# Patient Record
Sex: Male | Born: 1975 | Race: Black or African American | Hispanic: No | Marital: Single | Smoking: Former smoker
Health system: Southern US, Community
[De-identification: ages and names within clinical notes are randomized; demographics above are authoritative.]

## PROBLEM LIST (undated history)

## (undated) DIAGNOSIS — I2699 Other pulmonary embolism without acute cor pulmonale: Secondary | ICD-10-CM

## (undated) DIAGNOSIS — I10 Essential (primary) hypertension: Secondary | ICD-10-CM

## (undated) DIAGNOSIS — M1990 Unspecified osteoarthritis, unspecified site: Secondary | ICD-10-CM

## (undated) DIAGNOSIS — K2970 Gastritis, unspecified, without bleeding: Secondary | ICD-10-CM

## (undated) DIAGNOSIS — F329 Major depressive disorder, single episode, unspecified: Secondary | ICD-10-CM

## (undated) DIAGNOSIS — M109 Gout, unspecified: Secondary | ICD-10-CM

## (undated) DIAGNOSIS — F419 Anxiety disorder, unspecified: Secondary | ICD-10-CM

## (undated) DIAGNOSIS — F32A Depression, unspecified: Secondary | ICD-10-CM

## (undated) DIAGNOSIS — D500 Iron deficiency anemia secondary to blood loss (chronic): Secondary | ICD-10-CM

## (undated) HISTORY — PX: NO PAST SURGERIES: SHX2092

---
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
## 2001-11-22 ENCOUNTER — Emergency Department (HOSPITAL_COMMUNITY): Admission: EM | Admit: 2001-11-22 | Discharge: 2001-11-23 | Payer: Self-pay | Admitting: Emergency Medicine

## 2001-11-23 ENCOUNTER — Encounter: Payer: Self-pay | Admitting: Emergency Medicine

## 2001-12-08 ENCOUNTER — Encounter: Admission: RE | Admit: 2001-12-08 | Discharge: 2001-12-15 | Payer: Self-pay | Admitting: Orthopedic Surgery

## 2002-09-23 ENCOUNTER — Emergency Department (HOSPITAL_COMMUNITY): Admission: EM | Admit: 2002-09-23 | Discharge: 2002-09-23 | Payer: Self-pay | Admitting: *Deleted

## 2002-09-23 ENCOUNTER — Encounter: Payer: Self-pay | Admitting: *Deleted

## 2002-09-27 ENCOUNTER — Ambulatory Visit (HOSPITAL_COMMUNITY): Admission: RE | Admit: 2002-09-27 | Discharge: 2002-09-27 | Payer: Self-pay | Admitting: Orthopedic Surgery

## 2002-09-27 ENCOUNTER — Encounter: Payer: Self-pay | Admitting: Orthopedic Surgery

## 2002-12-05 ENCOUNTER — Encounter: Admission: RE | Admit: 2002-12-05 | Discharge: 2003-02-28 | Payer: Self-pay | Admitting: Orthopedic Surgery

## 2003-06-20 ENCOUNTER — Emergency Department (HOSPITAL_COMMUNITY): Admission: EM | Admit: 2003-06-20 | Discharge: 2003-06-20 | Payer: Self-pay | Admitting: Emergency Medicine

## 2003-07-03 ENCOUNTER — Emergency Department (HOSPITAL_COMMUNITY): Admission: EM | Admit: 2003-07-03 | Discharge: 2003-07-03 | Payer: Self-pay | Admitting: *Deleted

## 2003-09-29 ENCOUNTER — Emergency Department (HOSPITAL_COMMUNITY): Admission: EM | Admit: 2003-09-29 | Discharge: 2003-09-29 | Payer: Self-pay | Admitting: Emergency Medicine

## 2004-09-16 ENCOUNTER — Emergency Department (HOSPITAL_COMMUNITY): Admission: EM | Admit: 2004-09-16 | Discharge: 2004-09-16 | Payer: Self-pay | Admitting: Family Medicine

## 2006-06-04 ENCOUNTER — Inpatient Hospital Stay (HOSPITAL_COMMUNITY): Admission: EM | Admit: 2006-06-04 | Discharge: 2006-06-05 | Payer: Self-pay | Admitting: Emergency Medicine

## 2006-06-12 ENCOUNTER — Ambulatory Visit: Payer: Self-pay | Admitting: *Deleted

## 2006-06-12 ENCOUNTER — Ambulatory Visit: Payer: Self-pay | Admitting: Nurse Practitioner

## 2006-06-13 ENCOUNTER — Inpatient Hospital Stay (HOSPITAL_COMMUNITY): Admission: EM | Admit: 2006-06-13 | Discharge: 2006-06-15 | Payer: Self-pay | Admitting: Emergency Medicine

## 2006-06-18 ENCOUNTER — Ambulatory Visit: Payer: Self-pay | Admitting: Gastroenterology

## 2007-09-01 ENCOUNTER — Inpatient Hospital Stay (HOSPITAL_COMMUNITY): Admission: EM | Admit: 2007-09-01 | Discharge: 2007-09-03 | Payer: Self-pay | Admitting: Emergency Medicine

## 2008-11-21 ENCOUNTER — Inpatient Hospital Stay (HOSPITAL_COMMUNITY): Admission: EM | Admit: 2008-11-21 | Discharge: 2008-11-23 | Payer: Self-pay | Admitting: Emergency Medicine

## 2009-02-10 ENCOUNTER — Inpatient Hospital Stay (HOSPITAL_COMMUNITY): Admission: EM | Admit: 2009-02-10 | Discharge: 2009-02-13 | Payer: Self-pay | Admitting: Emergency Medicine

## 2009-04-10 ENCOUNTER — Inpatient Hospital Stay (HOSPITAL_COMMUNITY): Admission: EM | Admit: 2009-04-10 | Discharge: 2009-04-12 | Payer: Self-pay | Admitting: Emergency Medicine

## 2009-05-16 ENCOUNTER — Ambulatory Visit: Payer: Self-pay | Admitting: Internal Medicine

## 2009-06-29 ENCOUNTER — Ambulatory Visit: Payer: Self-pay | Admitting: Internal Medicine

## 2009-06-29 ENCOUNTER — Telehealth (INDEPENDENT_AMBULATORY_CARE_PROVIDER_SITE_OTHER): Payer: Self-pay | Admitting: *Deleted

## 2009-06-29 ENCOUNTER — Inpatient Hospital Stay (HOSPITAL_COMMUNITY): Admission: EM | Admit: 2009-06-29 | Discharge: 2009-07-02 | Payer: Self-pay | Admitting: Emergency Medicine

## 2010-05-03 ENCOUNTER — Emergency Department (HOSPITAL_COMMUNITY): Admission: EM | Admit: 2010-05-03 | Discharge: 2010-05-03 | Payer: Self-pay | Admitting: Emergency Medicine

## 2010-05-15 ENCOUNTER — Ambulatory Visit: Payer: Self-pay | Admitting: Internal Medicine

## 2010-05-15 ENCOUNTER — Encounter (INDEPENDENT_AMBULATORY_CARE_PROVIDER_SITE_OTHER): Payer: Self-pay | Admitting: Adult Health

## 2010-05-15 LAB — CONVERTED CEMR LAB
ALT: 18 units/L (ref 0–53)
CO2: 20 meq/L (ref 19–32)
Cholesterol: 194 mg/dL (ref 0–200)
LDL Cholesterol: 97 mg/dL (ref 0–99)
Sodium: 137 meq/L (ref 135–145)
Total Bilirubin: 0.9 mg/dL (ref 0.3–1.2)
Total Protein: 7.3 g/dL (ref 6.0–8.3)
Uric Acid, Serum: 9.7 mg/dL — ABNORMAL HIGH (ref 4.0–7.8)
VLDL: 17 mg/dL (ref 0–40)

## 2010-05-28 ENCOUNTER — Encounter (INDEPENDENT_AMBULATORY_CARE_PROVIDER_SITE_OTHER): Payer: Self-pay | Admitting: Adult Health

## 2010-05-28 ENCOUNTER — Ambulatory Visit: Payer: Self-pay | Admitting: Internal Medicine

## 2010-05-28 LAB — CONVERTED CEMR LAB: Microalb, Ur: 0.5 mg/dL (ref 0.00–1.89)

## 2010-08-24 ENCOUNTER — Emergency Department (HOSPITAL_COMMUNITY): Admission: EM | Admit: 2010-08-24 | Discharge: 2010-08-24 | Payer: Self-pay | Admitting: Emergency Medicine

## 2010-09-04 ENCOUNTER — Emergency Department (HOSPITAL_COMMUNITY): Admission: EM | Admit: 2010-09-04 | Discharge: 2010-09-05 | Payer: Self-pay | Admitting: Emergency Medicine

## 2010-11-23 ENCOUNTER — Emergency Department (HOSPITAL_COMMUNITY)
Admission: EM | Admit: 2010-11-23 | Discharge: 2010-11-23 | Payer: Self-pay | Source: Home / Self Care | Admitting: Emergency Medicine

## 2010-11-26 LAB — URINALYSIS, ROUTINE W REFLEX MICROSCOPIC
Bilirubin Urine: NEGATIVE
Ketones, ur: 15 mg/dL — AB
Leukocytes, UA: NEGATIVE
Urine Glucose, Fasting: NEGATIVE mg/dL
pH: 6 (ref 5.0–8.0)

## 2010-11-26 LAB — URINE MICROSCOPIC-ADD ON

## 2010-11-26 LAB — BASIC METABOLIC PANEL
BUN: 7 mg/dL (ref 6–23)
Calcium: 9.2 mg/dL (ref 8.4–10.5)
Creatinine, Ser: 0.84 mg/dL (ref 0.4–1.5)
GFR calc non Af Amer: >60 mL/min (ref >60)
Glucose, Bld: 155 mg/dL — ABNORMAL HIGH (ref 70–99)

## 2011-01-14 LAB — COMPREHENSIVE METABOLIC PANEL
ALT: 31 U/L (ref 0–53)
AST: 23 U/L (ref 0–37)
Calcium: 9.2 mg/dL (ref 8.4–10.5)
GFR calc Af Amer: >60 mL/min (ref >60)
Sodium: 138 mEq/L (ref 135–145)
Total Protein: 7.8 g/dL (ref 6.0–8.3)

## 2011-01-14 LAB — URINALYSIS, ROUTINE W REFLEX MICROSCOPIC
Bilirubin Urine: NEGATIVE
Hgb urine dipstick: NEGATIVE
Nitrite: NEGATIVE
Specific Gravity, Urine: 1.021 (ref 1.005–1.030)
pH: 5.5 (ref 5.0–8.0)

## 2011-01-14 LAB — CBC
MCHC: 33.2 g/dL (ref 30.0–36.0)
Platelets: 531 10*3/uL — ABNORMAL HIGH (ref 150–400)
RDW: 14.9 % (ref 11.5–15.5)

## 2011-01-15 LAB — POCT I-STAT, CHEM 8
Calcium, Ion: 1.02 mmol/L — ABNORMAL LOW (ref 1.12–1.32)
Hemoglobin: 13.9 g/dL (ref 13.0–17.0)
Sodium: 127 mEq/L — ABNORMAL LOW (ref 135–145)
TCO2: 29 mmol/L (ref 0–100)

## 2011-01-15 LAB — COMPREHENSIVE METABOLIC PANEL
ALT: 44 U/L (ref 0–53)
Albumin: 2.8 g/dL — ABNORMAL LOW (ref 3.5–5.2)
Alkaline Phosphatase: 76 U/L (ref 39–117)
BUN: 16 mg/dL (ref 6–23)
Chloride: 86 mEq/L — ABNORMAL LOW (ref 96–112)
Potassium: 3.5 mEq/L (ref 3.5–5.1)
Sodium: 125 mEq/L — ABNORMAL LOW (ref 135–145)
Total Bilirubin: 1.6 mg/dL — ABNORMAL HIGH (ref 0.3–1.2)

## 2011-01-15 LAB — CBC
HCT: 35.8 % — ABNORMAL LOW (ref 39.0–52.0)
MCV: 86.7 fL (ref 78.0–100.0)
Platelets: 173 10*3/uL (ref 150–400)
RBC: 4.13 MIL/uL — ABNORMAL LOW (ref 4.22–5.81)
WBC: 9.7 10*3/uL (ref 4.0–10.5)

## 2011-01-15 LAB — DIFFERENTIAL
Basophils Absolute: 0 10*3/uL (ref 0.0–0.1)
Basophils Relative: 0 % (ref 0–1)
Eosinophils Absolute: 0 10*3/uL (ref 0.0–0.7)
Eosinophils Relative: 0 % (ref 0–5)
Monocytes Absolute: 1.6 10*3/uL — ABNORMAL HIGH (ref 0.1–1.0)
Monocytes Relative: 16 % — ABNORMAL HIGH (ref 3–12)
Neutro Abs: 7 10*3/uL (ref 1.7–7.7)

## 2011-01-15 LAB — RAPID URINE DRUG SCREEN, HOSP PERFORMED
Amphetamines: NOT DETECTED
Benzodiazepines: POSITIVE — AB
Cocaine: NOT DETECTED
Tetrahydrocannabinol: POSITIVE — AB

## 2011-01-15 LAB — GLUCOSE, CAPILLARY: Glucose-Capillary: 224 mg/dL — ABNORMAL HIGH (ref 70–99)

## 2011-01-15 LAB — ETHANOL: Alcohol, Ethyl (B): <5 mg/dL (ref 0–10)

## 2011-01-19 LAB — COMPREHENSIVE METABOLIC PANEL
AST: 26 U/L (ref 0–37)
BUN: 9 mg/dL (ref 6–23)
CO2: 26 mEq/L (ref 19–32)
Calcium: 9.8 mg/dL (ref 8.4–10.5)
Chloride: 97 mEq/L (ref 96–112)
Creatinine, Ser: 1.27 mg/dL (ref 0.4–1.5)
GFR calc Af Amer: >60 mL/min (ref >60)
GFR calc non Af Amer: >60 mL/min (ref >60)
Glucose, Bld: 158 mg/dL — ABNORMAL HIGH (ref 70–99)
Total Bilirubin: 0.8 mg/dL (ref 0.3–1.2)

## 2011-01-19 LAB — DIFFERENTIAL
Basophils Absolute: 0 10*3/uL (ref 0.0–0.1)
Eosinophils Relative: 1 % (ref 0–5)
Lymphocytes Relative: 19 % (ref 12–46)
Neutrophils Relative %: 71 % (ref 43–77)

## 2011-01-19 LAB — URINALYSIS, ROUTINE W REFLEX MICROSCOPIC
Ketones, ur: 40 mg/dL — AB
Leukocytes, UA: NEGATIVE
Nitrite: NEGATIVE
Specific Gravity, Urine: 1.023 (ref 1.005–1.030)
Urobilinogen, UA: 1 mg/dL (ref 0.0–1.0)

## 2011-01-19 LAB — CBC
HCT: 45.5 % (ref 39.0–52.0)
Hemoglobin: 15.3 g/dL (ref 13.0–17.0)
MCH: 30.5 pg (ref 26.0–34.0)
MCHC: 33.7 g/dL (ref 30.0–36.0)
MCV: 90.4 fL (ref 78.0–100.0)
RBC: 5.03 MIL/uL (ref 4.22–5.81)

## 2011-01-19 LAB — LIPASE, BLOOD: Lipase: 30 U/L (ref 11–59)

## 2011-02-08 LAB — BASIC METABOLIC PANEL
CO2: 23 mEq/L (ref 19–32)
CO2: 26 mEq/L (ref 19–32)
Calcium: 8.5 mg/dL (ref 8.4–10.5)
Calcium: 8.5 mg/dL (ref 8.4–10.5)
Chloride: 96 mEq/L (ref 96–112)
Creatinine, Ser: 0.92 mg/dL (ref 0.4–1.5)
GFR calc Af Amer: >60 mL/min (ref >60)
GFR calc Af Amer: >60 mL/min (ref >60)
GFR calc Af Amer: >60 mL/min (ref >60)
GFR calc non Af Amer: 51 mL/min — ABNORMAL LOW (ref >60)
Glucose, Bld: 144 mg/dL — ABNORMAL HIGH (ref 70–99)
Potassium: 2.9 mEq/L — ABNORMAL LOW (ref 3.5–5.1)
Potassium: 4 mEq/L (ref 3.5–5.1)
Sodium: 130 mEq/L — ABNORMAL LOW (ref 135–145)
Sodium: 137 mEq/L (ref 135–145)

## 2011-02-08 LAB — DIFFERENTIAL
Basophils Absolute: 0 10*3/uL (ref 0.0–0.1)
Basophils Relative: 0 % (ref 0–1)
Lymphocytes Relative: 34 % (ref 12–46)
Lymphs Abs: 3.4 10*3/uL (ref 0.7–4.0)
Monocytes Absolute: 0.6 10*3/uL (ref 0.1–1.0)
Monocytes Relative: 9 % (ref 3–12)
Neutro Abs: 5.7 10*3/uL (ref 1.7–7.7)
Neutro Abs: 8.4 10*3/uL — ABNORMAL HIGH (ref 1.7–7.7)
Neutrophils Relative %: 56 % (ref 43–77)
Neutrophils Relative %: 85 % — ABNORMAL HIGH (ref 43–77)

## 2011-02-08 LAB — CK TOTAL AND CKMB (NOT AT ARMC): Total CK: 145 U/L (ref 7–232)

## 2011-02-08 LAB — URINE MICROSCOPIC-ADD ON

## 2011-02-08 LAB — GLUCOSE, CAPILLARY
Glucose-Capillary: 118 mg/dL — ABNORMAL HIGH (ref 70–99)
Glucose-Capillary: 119 mg/dL — ABNORMAL HIGH (ref 70–99)
Glucose-Capillary: 131 mg/dL — ABNORMAL HIGH (ref 70–99)
Glucose-Capillary: 153 mg/dL — ABNORMAL HIGH (ref 70–99)
Glucose-Capillary: 163 mg/dL — ABNORMAL HIGH (ref 70–99)
Glucose-Capillary: 293 mg/dL — ABNORMAL HIGH (ref 70–99)
Glucose-Capillary: 399 mg/dL — ABNORMAL HIGH (ref 70–99)
Glucose-Capillary: 93 mg/dL (ref 70–99)

## 2011-02-08 LAB — URINALYSIS, ROUTINE W REFLEX MICROSCOPIC
Glucose, UA: 250 mg/dL — AB
Leukocytes, UA: NEGATIVE
pH: 5 (ref 5.0–8.0)

## 2011-02-08 LAB — CBC
MCHC: 33.9 g/dL (ref 30.0–36.0)
MCHC: 34.1 g/dL (ref 30.0–36.0)
MCV: 91.1 fL (ref 78.0–100.0)
Platelets: 261 10*3/uL (ref 150–400)
RBC: 4.11 MIL/uL — ABNORMAL LOW (ref 4.22–5.81)
RBC: 4.78 MIL/uL (ref 4.22–5.81)
RDW: 14.6 % (ref 11.5–15.5)
WBC: 10.2 10*3/uL (ref 4.0–10.5)
WBC: 7 10*3/uL (ref 4.0–10.5)

## 2011-02-08 LAB — COMPREHENSIVE METABOLIC PANEL
Albumin: 4.5 g/dL (ref 3.5–5.2)
Alkaline Phosphatase: 90 U/L (ref 39–117)
BUN: 27 mg/dL — ABNORMAL HIGH (ref 6–23)
Potassium: 3.7 mEq/L (ref 3.5–5.1)
Sodium: 120 mEq/L — ABNORMAL LOW (ref 135–145)
Total Protein: 9.9 g/dL — ABNORMAL HIGH (ref 6.0–8.3)

## 2011-02-08 LAB — CARDIAC PANEL(CRET KIN+CKTOT+MB+TROPI)
Relative Index: 1.2 (ref 0.0–2.5)
Total CK: 177 U/L (ref 7–232)
Total CK: 177 U/L (ref 7–232)
Troponin I: 0.04 ng/mL (ref 0.00–0.06)

## 2011-02-08 LAB — TSH: TSH: 0.416 u[IU]/mL (ref 0.350–4.500)

## 2011-02-08 LAB — MAGNESIUM: Magnesium: 2 mg/dL (ref 1.5–2.5)

## 2011-02-08 LAB — HEMOGLOBIN A1C: Mean Plasma Glucose: 151 mg/dL

## 2011-02-10 LAB — COMPREHENSIVE METABOLIC PANEL
ALT: 26 U/L (ref 0–53)
AST: 31 U/L (ref 0–37)
Alkaline Phosphatase: 59 U/L (ref 39–117)
BUN: 9 mg/dL (ref 6–23)
Calcium: 10.6 mg/dL — ABNORMAL HIGH (ref 8.4–10.5)
Chloride: 110 mEq/L (ref 96–112)
Creatinine, Ser: 0.91 mg/dL (ref 0.4–1.5)
Creatinine, Ser: 3.05 mg/dL — ABNORMAL HIGH (ref 0.4–1.5)
GFR calc Af Amer: 29 mL/min — ABNORMAL LOW (ref >60)
GFR calc non Af Amer: 24 mL/min — ABNORMAL LOW (ref >60)
Glucose, Bld: 128 mg/dL — ABNORMAL HIGH (ref 70–99)
Potassium: 4.2 mEq/L (ref 3.5–5.1)
Sodium: 127 mEq/L — ABNORMAL LOW (ref 135–145)
Total Bilirubin: 0.5 mg/dL (ref 0.3–1.2)
Total Protein: 9.7 g/dL — ABNORMAL HIGH (ref 6.0–8.3)

## 2011-02-10 LAB — URINALYSIS, ROUTINE W REFLEX MICROSCOPIC
Leukocytes, UA: NEGATIVE
Nitrite: NEGATIVE
Protein, ur: 100 mg/dL — AB
Urobilinogen, UA: 1 mg/dL (ref 0.0–1.0)
pH: 5 (ref 5.0–8.0)

## 2011-02-10 LAB — CBC
HCT: 38.6 % — ABNORMAL LOW (ref 39.0–52.0)
HCT: 38.7 % — ABNORMAL LOW (ref 39.0–52.0)
Hemoglobin: 13.1 g/dL (ref 13.0–17.0)
Hemoglobin: 13.2 g/dL (ref 13.0–17.0)
MCHC: 34.1 g/dL (ref 30.0–36.0)
MCV: 90.3 fL (ref 78.0–100.0)
MCV: 90.4 fL (ref 78.0–100.0)
Platelets: 241 10*3/uL (ref 150–400)
RDW: 14.2 % (ref 11.5–15.5)
RDW: 14.8 % (ref 11.5–15.5)
WBC: 10.4 10*3/uL (ref 4.0–10.5)
WBC: 9.9 10*3/uL (ref 4.0–10.5)

## 2011-02-10 LAB — DIFFERENTIAL
Basophils Absolute: 0 10*3/uL (ref 0.0–0.1)
Basophils Relative: 0 % (ref 0–1)
Eosinophils Absolute: 0 10*3/uL (ref 0.0–0.7)
Eosinophils Absolute: 0 10*3/uL (ref 0.0–0.7)
Eosinophils Relative: 0 % (ref 0–5)
Lymphocytes Relative: 15 % (ref 12–46)
Lymphs Abs: 2 10*3/uL (ref 0.7–4.0)
Monocytes Relative: 11 % (ref 3–12)
Monocytes Relative: 12 % (ref 3–12)
Neutro Abs: 6.3 10*3/uL (ref 1.7–7.7)

## 2011-02-10 LAB — GLUCOSE, CAPILLARY
Glucose-Capillary: 120 mg/dL — ABNORMAL HIGH (ref 70–99)
Glucose-Capillary: 126 mg/dL — ABNORMAL HIGH (ref 70–99)
Glucose-Capillary: 132 mg/dL — ABNORMAL HIGH (ref 70–99)
Glucose-Capillary: 132 mg/dL — ABNORMAL HIGH (ref 70–99)
Glucose-Capillary: 147 mg/dL — ABNORMAL HIGH (ref 70–99)
Glucose-Capillary: 163 mg/dL — ABNORMAL HIGH (ref 70–99)
Glucose-Capillary: 184 mg/dL — ABNORMAL HIGH (ref 70–99)
Glucose-Capillary: 338 mg/dL — ABNORMAL HIGH (ref 70–99)

## 2011-02-10 LAB — URINE CULTURE
Colony Count: NO GROWTH
Culture: NO GROWTH

## 2011-02-10 LAB — HEMOGLOBIN A1C: Hgb A1c MFr Bld: 6.2 % — ABNORMAL HIGH (ref 4.6–6.1)

## 2011-02-10 LAB — URINE MICROSCOPIC-ADD ON

## 2011-02-10 LAB — LIPID PANEL: Cholesterol: 138 mg/dL (ref 0–200)

## 2011-02-10 LAB — LIPASE, BLOOD: Lipase: 32 U/L (ref 11–59)

## 2011-02-10 LAB — MAGNESIUM: Magnesium: 2.4 mg/dL (ref 1.5–2.5)

## 2011-02-10 LAB — BASIC METABOLIC PANEL
GFR calc non Af Amer: >60 mL/min (ref >60)
Potassium: 4 mEq/L (ref 3.5–5.1)
Sodium: 132 mEq/L — ABNORMAL LOW (ref 135–145)

## 2011-02-10 LAB — KETONES, QUALITATIVE: Acetone, Bld: NEGATIVE

## 2011-02-12 LAB — CBC
HCT: 36.2 % — ABNORMAL LOW (ref 39.0–52.0)
HCT: 37.3 % — ABNORMAL LOW (ref 39.0–52.0)
Hemoglobin: 12.9 g/dL — ABNORMAL LOW (ref 13.0–17.0)
Hemoglobin: 16.5 g/dL (ref 13.0–17.0)
MCHC: 34.6 g/dL (ref 30.0–36.0)
MCV: 89 fL (ref 78.0–100.0)
MCV: 89.1 fL (ref 78.0–100.0)
MCV: 90.4 fL (ref 78.0–100.0)
Platelets: 417 10*3/uL — ABNORMAL HIGH (ref 150–400)
RBC: 4 MIL/uL — ABNORMAL LOW (ref 4.22–5.81)
RBC: 5.45 MIL/uL (ref 4.22–5.81)
RDW: 14.1 % (ref 11.5–15.5)
WBC: 10.7 10*3/uL — ABNORMAL HIGH (ref 4.0–10.5)
WBC: 10.8 10*3/uL — ABNORMAL HIGH (ref 4.0–10.5)
WBC: 14 10*3/uL — ABNORMAL HIGH (ref 4.0–10.5)

## 2011-02-12 LAB — DIFFERENTIAL
Basophils Relative: 1 % (ref 0–1)
Eosinophils Absolute: 0 10*3/uL (ref 0.0–0.7)
Monocytes Relative: 7 % (ref 3–12)
Neutrophils Relative %: 74 % (ref 43–77)

## 2011-02-12 LAB — BASIC METABOLIC PANEL
Chloride: 104 mEq/L (ref 96–112)
GFR calc Af Amer: >60 mL/min (ref >60)
Potassium: 3.5 mEq/L (ref 3.5–5.1)

## 2011-02-12 LAB — COMPREHENSIVE METABOLIC PANEL
ALT: 39 U/L (ref 0–53)
AST: 35 U/L (ref 0–37)
Albumin: 3.5 g/dL (ref 3.5–5.2)
Alkaline Phosphatase: 90 U/L (ref 39–117)
BUN: 13 mg/dL (ref 6–23)
CO2: 26 mEq/L (ref 19–32)
Calcium: 8.5 mg/dL (ref 8.4–10.5)
Chloride: 82 mEq/L — ABNORMAL LOW (ref 96–112)
Chloride: 95 mEq/L — ABNORMAL LOW (ref 96–112)
Creatinine, Ser: 0.84 mg/dL (ref 0.4–1.5)
Creatinine, Ser: 1.27 mg/dL (ref 0.4–1.5)
GFR calc Af Amer: >60 mL/min (ref >60)
GFR calc non Af Amer: 25 mL/min — ABNORMAL LOW (ref >60)
Glucose, Bld: 168 mg/dL — ABNORMAL HIGH (ref 70–99)
Glucose, Bld: 241 mg/dL — ABNORMAL HIGH (ref 70–99)
Potassium: 5.1 mEq/L (ref 3.5–5.1)
Sodium: 126 mEq/L — ABNORMAL LOW (ref 135–145)
Total Bilirubin: 0.6 mg/dL (ref 0.3–1.2)
Total Bilirubin: 2 mg/dL — ABNORMAL HIGH (ref 0.3–1.2)
Total Protein: 10.1 g/dL — ABNORMAL HIGH (ref 6.0–8.3)
Total Protein: 6.9 g/dL (ref 6.0–8.3)

## 2011-02-12 LAB — GLUCOSE, CAPILLARY
Glucose-Capillary: 176 mg/dL — ABNORMAL HIGH (ref 70–99)
Glucose-Capillary: 251 mg/dL — ABNORMAL HIGH (ref 70–99)

## 2011-02-12 LAB — MAGNESIUM: Magnesium: 2 mg/dL (ref 1.5–2.5)

## 2011-02-17 LAB — COMPREHENSIVE METABOLIC PANEL
ALT: 72 U/L — ABNORMAL HIGH (ref 0–53)
AST: 34 U/L (ref 0–37)
BUN: 6 mg/dL (ref 6–23)
CO2: 23 mEq/L (ref 19–32)
CO2: 24 mEq/L (ref 19–32)
Calcium: 10.2 mg/dL (ref 8.4–10.5)
Chloride: 104 mEq/L (ref 96–112)
Chloride: 91 mEq/L — ABNORMAL LOW (ref 96–112)
Creatinine, Ser: 0.97 mg/dL (ref 0.4–1.5)
GFR calc Af Amer: >60 mL/min (ref >60)
GFR calc non Af Amer: 51 mL/min — ABNORMAL LOW (ref >60)
GFR calc non Af Amer: >60 mL/min (ref >60)
Glucose, Bld: 108 mg/dL — ABNORMAL HIGH (ref 70–99)
Glucose, Bld: 251 mg/dL — ABNORMAL HIGH (ref 70–99)
Sodium: 131 mEq/L — ABNORMAL LOW (ref 135–145)
Total Bilirubin: 1.1 mg/dL (ref 0.3–1.2)
Total Bilirubin: 1.6 mg/dL — ABNORMAL HIGH (ref 0.3–1.2)

## 2011-02-17 LAB — URINALYSIS, ROUTINE W REFLEX MICROSCOPIC
Ketones, ur: 15 mg/dL — AB
Leukocytes, UA: NEGATIVE
Nitrite: NEGATIVE
Protein, ur: 100 mg/dL — AB
Urobilinogen, UA: 1 mg/dL (ref 0.0–1.0)

## 2011-02-17 LAB — LIPID PANEL
Cholesterol: 205 mg/dL — ABNORMAL HIGH (ref 0–200)
LDL Cholesterol: 119 mg/dL — ABNORMAL HIGH (ref 0–99)
Total CHOL/HDL Ratio: 3.2 RATIO
Triglycerides: 112 mg/dL (ref <150)
VLDL: 22 mg/dL (ref 0–40)

## 2011-02-17 LAB — HEPATITIS PANEL, ACUTE
HCV Ab: NEGATIVE
Hepatitis B Surface Ag: NEGATIVE

## 2011-02-17 LAB — GLUCOSE, CAPILLARY
Glucose-Capillary: 109 mg/dL — ABNORMAL HIGH (ref 70–99)
Glucose-Capillary: 116 mg/dL — ABNORMAL HIGH (ref 70–99)
Glucose-Capillary: 130 mg/dL — ABNORMAL HIGH (ref 70–99)
Glucose-Capillary: 148 mg/dL — ABNORMAL HIGH (ref 70–99)
Glucose-Capillary: 204 mg/dL — ABNORMAL HIGH (ref 70–99)
Glucose-Capillary: 295 mg/dL — ABNORMAL HIGH (ref 70–99)

## 2011-02-17 LAB — HEMOGLOBIN A1C: Hgb A1c MFr Bld: 6.7 % — ABNORMAL HIGH (ref 4.6–6.1)

## 2011-02-17 LAB — CBC
HCT: 37.1 % — ABNORMAL LOW (ref 39.0–52.0)
Hemoglobin: 12.5 g/dL — ABNORMAL LOW (ref 13.0–17.0)
Hemoglobin: 15.9 g/dL (ref 13.0–17.0)
MCHC: 33.6 g/dL (ref 30.0–36.0)
MCV: 89 fL (ref 78.0–100.0)
MCV: 90.2 fL (ref 78.0–100.0)
RBC: 4.11 MIL/uL — ABNORMAL LOW (ref 4.22–5.81)
RBC: 5.31 MIL/uL (ref 4.22–5.81)
WBC: 10.2 10*3/uL (ref 4.0–10.5)
WBC: 7.2 10*3/uL (ref 4.0–10.5)

## 2011-02-17 LAB — POCT I-STAT, CHEM 8
BUN: 22 mg/dL (ref 6–23)
Creatinine, Ser: 1.7 mg/dL — ABNORMAL HIGH (ref 0.4–1.5)
Potassium: 5.8 mEq/L — ABNORMAL HIGH (ref 3.5–5.1)
Sodium: 126 mEq/L — ABNORMAL LOW (ref 135–145)
TCO2: 26 mmol/L (ref 0–100)

## 2011-02-17 LAB — DIFFERENTIAL
Basophils Absolute: 0 10*3/uL (ref 0.0–0.1)
Basophils Relative: 0 % (ref 0–1)
Eosinophils Absolute: 0 10*3/uL (ref 0.0–0.7)
Eosinophils Relative: 0 % (ref 0–5)
Lymphs Abs: 1.7 10*3/uL (ref 0.7–4.0)
Neutrophils Relative %: 74 % (ref 43–77)

## 2011-02-17 LAB — URINE MICROSCOPIC-ADD ON

## 2011-02-17 LAB — LIPASE, BLOOD: Lipase: 33 U/L (ref 11–59)

## 2011-02-17 LAB — RAPID URINE DRUG SCREEN, HOSP PERFORMED
Opiates: NOT DETECTED
Tetrahydrocannabinol: POSITIVE — AB

## 2011-02-17 LAB — ETHANOL: Alcohol, Ethyl (B): <5 mg/dL (ref 0–10)

## 2011-03-18 NOTE — H&P (Signed)
NAME:  Richard Lopez, Richard Lopez                 ACCOUNT NO.:  [id number]   MEDICAL RECORD NO.:  [id number]          PATIENT TYPE:  INP   LOCATION:  1524                         FACILITY:  [facility]   PHYSICIAN:  Della Goo, M.D. DATE OF BIRTH:  05-19-1976   DATE OF ADMISSION:  02/10/2009  DATE OF DISCHARGE:                              HISTORY & PHYSICAL   PRIMARY CARE PHYSICIAN:  [facility]   CHIEF COMPLAINT:  Abdominal pain.   HISTORY OF PRESENT ILLNESS:  This is a 35 year old male who presents to  the emergency department with complaints of severe abdominal pain, which  is epigastric in location and radiating across the abdomen.  He states  that he has had this discomfort for the past 3 days.  He has had nausea  and vomiting.  He denies having any fevers or chills.  He does report  having heavy drinking 3 days ago, and he does have a history of  alcoholism.  He denies having any diarrhea.  He denies having any  hematemesis.  He denies having any urinary or bowel changes.   PAST MEDICAL HISTORY:  Significant for:  1. Alcoholism.  2. Alcoholic gastritis.  3. Alcoholic hepatitis.  4. Borderline type 2 diabetes mellitus.  5. Hypertension.  6. Hyperlipidemia.   MEDICATIONS AT THIS TIME:  None.   PAST SURGICAL HISTORY:  None.   ALLERGIES:  No known drug allergies.   SOCIAL HISTORY:  The patient is a heavy drinker.  He reports that he  drinks a fifth of vodka daily.  He also reports marijuana usage.  He  denies any current tobacco usage; reports quitting 3 months ago.   FAMILY HISTORY:  Mother with hypertension and cerebrovascular accident  history.  No history of coronary artery disease, diabetes or cancer in  his family that he knows of.   REVIEW OF SYSTEMS:  Pertinents are mentioned above in the HPI.  All  other systems are negative.   PHYSICAL EXAMINATION FINDINGS:  This is a 35 year old overweight, mildly  obese, well-developed male in discomfort but no acute distress.  VITAL  SIGNS:  Temperature 97.2, blood pressure 139/92, heart rate 97,  respirations 20, O2 saturations 95% on room air.  HEENT EXAMINATION:  Normocephalic, atraumatic.  Pupils equally round and  reactive to light.  Extraocular movements are intact.  Funduscopic  benign.  Oropharynx is clear.  NECK:  Supple, full range of motion.  No thyromegaly, adenopathy or  jugular venous distention.  CARDIOVASCULAR:  Regular rate and rhythm.  No murmurs, gallops or rubs.  LUNGS:  Clear to auscultation bilaterally.  ABDOMEN:  Positive bowel sounds; soft, nontender and nondistended.  There is no palpable hepatosplenomegaly.  No rebound or guarding.  EXTREMITIES:  Without cyanosis, clubbing or edema.  NEUROLOGIC EXAMINATION:  Alert and oriented x3.  Cranial nerves are  intact.  Motor and sensory function intact.  Deep tendon reflexes 2/4  and symmetric.  Gait is within normal limits.   LABORATORY STUDIES:  White blood cell count 14.0, hemoglobin 16.6,  hematocrit 48.9, MCV 89.7, platelets 435, neutrophils 74%, lymphocytes  18%.  Sodium  126, potassium 5.1, chloride 82, CO2 26, BUN 21, creatinine  3.00, glucose 241.  Lipase 41.  Alcohol level less than 5.   ASSESSMENT:  A 35 year old male being admitted with:  1. Epigastric abdominal pain.  2. Nausea and vomiting.  3. Hyponatremia.  4. Acute renal failure with chronic renal insufficiency.  5. Hyperglycemia.   PLAN:  The patient will be admitted and placed on IV fluids for fluid  resuscitation, and a clear liquid diet for mild bowel rest.  Antiemetic  therapy has been ordered, along with pain control therapy as well.  The  patient's electrolytes will be monitored and replaced as needed.  The  patient will be placed on the IV Ativan protocol for alcohol withdrawal.  DVT and GI prophylaxis have also been ordered.  Further workup will  ensue, pending the results of the patient's clinical course.      Della Goo, M.D.  Electronically Signed      HJ/MEDQ  D:  02/11/2009  T:  02/11/2009  Job:  [id number]

## 2011-03-18 NOTE — Discharge Summary (Signed)
NAME:  Richard Lopez, MONCUS                 ACCOUNT NO.:  [id number]   MEDICAL RECORD NO.:  [id number]          PATIENT TYPE:  INP   LOCATION:  1509                         FACILITY:  [facility]   PHYSICIAN:  Hillery Aldo, M.D.   DATE OF BIRTH:  08-Jun-1976   DATE OF ADMISSION:  11/21/2008  DATE OF DISCHARGE:  11/23/2008                               DISCHARGE SUMMARY   PRIMARY CARE PHYSICIAN:  Unassigned.  The patient has previously seen  Dr. Emeline Darling at [facility].  He will be referred back there for hospital  follow-up/primary care.   DISCHARGE DIAGNOSES:  1. Nausea and vomiting secondary to alcohol induced gastritis.  2. Acute renal failure secondary to dehydration, resolved.  3. Alcohol abuse.  4. Transaminitis secondary to acute alcohol induced hepatitis.  5. Dyslipidemia.  6. Diet-controlled diabetes.  7. Hypertension.  8. Hypokalemia.  9. Thrombocytopenia.  10.Marijuana use.   DISCHARGE MEDICATIONS:  1. Prilosec 20 mg daily.  2. Phenergan 25 mg q.6h. p.r.n. nausea/vomiting.  3. Multivitamin 1 tablet daily.  4. Thiamine 100 mg daily.   CONSULTATIONS:  None.   BRIEF ADMISSION HPI:  The patient is a 35 year old male with a past  medical history of alcohol abuse who presented to the hospital with a  chief complaint of nausea, vomiting and abdominal pain after a 4-day  history of binge drinking and excessive alcohol intake.  The patient  admitted to drinking approximately one-fifth to a pint of vodka to over  daily for the past 3-4 days.  Upon initial evaluation in the emergency  department, the patient was actively vomiting and had evidence of acute  renal failure and dehydration and subsequently was admitted for  rehydration and treatment.  For the full details, please see the  dictated report done by Peggye Pitt, M.D.   PROCEDURES AND DIAGNOSTIC STUDIES:  Acute abdominal series on November 21, 2008 showed no active cardiopulmonary disease.  No acute or specific  findings  related to the abdomen.   DISCHARGE LABORATORY VALUES:  Sodium was 137, potassium 3.2 (repleted  with 40 mEq of oral potassium prior to discharge), chloride 104, bicarb  23, BUN 6, creatinine 0.97, glucose 108.  White blood cell count was  7.2, hemoglobin 12.5, hematocrit 37.1, platelets 121.  Liver function  studies were within normal limits with a mildly low total protein of 5.9  and an albumin of 3.3.   HOSPITAL COURSE:  1. Nausea and vomiting secondary to alcohol-induced gastritis:  The      patient was admitted and put on a IV fluid hydration and IV proton-      pump-inhibitor therapy.  He was provided with antiemetics and      rehydrated.  His nausea and vomiting resolved with no further      episodes throughout his hospital stay.  He has been strongly      encouraged to avoid alcohol in the future.  2. Acute renal failure secondary to dehydration:  The patient was      fully rehydrated and his renal parameters returned to normal.  He  has not had any further episodes of nausea and vomiting.  3. Alcohol dependency/binge drinking:  The patient has a history of      binge drinking alcohol abuse.  He was evaluated by the [employer] and provided with community resources for substance abuse      programs in the community.  He was encouraged strongly to avoid      alcohol and to return to [employer]  He was also encouraged to      supplement his diet with a multivitamin and thiamine daily.  The      patient was provided Ativan as-needed, but did not develop any      significant signs or symptoms of withdrawal or delirium tremors.  4. Transaminitis secondary to acute alcohol hepatitis:  The patient      did have his liver function studies monitored while in the hospital      and they normalized with IV fluid rehydration.  Viral hepatitis      studies were negative.  The patient has been encouraged to avoid      alcohol.  5. Dyslipidemia:  The patient did have a fasting  lipid profile done      which showed a total cholesterol of 205, triglycerides 112, HDL of      64, and LDL of 119.  The patient has mild dyslipidemia and was      provided with written instructions regarding a low-fat diet.  6. Diet-controlled diabetes:  The patient has a history of diabetes      that has been diet controlled.  A hemoglobin A1c was checked and      found to be 6.7% which is indicative of a mean plasma glucose of      146.  He is encouraged to maintain a carbohydrate modified diet.  7. Hypertension:  The patient had transient hypertension in the      hospital which may have been due to withdrawal phenomenon.  He was      provided with clonidine given p.r.n.  His discharge blood pressure      is 144/70 and therefore, he will not be routinely put on      antihypertensives.  He should follow up with Dr. Emeline Darling at      [facility] for ongoing surveillance and treatment if indicated.  8. Hypokalemia:  This was felt to be secondary to GI losses.  He was      given 40 mEq of potassium supplementation prior to discharge.  9. Thrombocytopenia:  This was felt to be due to bone marrow      suppression from alcohol toxicity.  He had no signs or symptoms of      bleeding while in the hospital and has been fully encouraged to      avoid alcohol in the future.  10.Marijuana use:  The patient was provided with community resources      to help with substance abuse problems.   DISPOSITION:  The patient is medically stable and be discharged home.  He was encouraged to return to [facility] for his primary care needs or  to call [phone] for another physician referral.  He is fully encouraged  to avoid alcohol and provided with community resources to help with  substance abuse problems.  He is fully instructed on a low fat/diabetic  diet.   Time spent coordinating care for discharge and discharge instructions  equals 35 minutes.  Hillery Aldo, M.D.  Electronically Signed      CR/MEDQ  D:  11/23/2008  T:  11/23/2008  Job:  [id number]   cc:   Emeline Darling, Dr.  Dala Dock

## 2011-03-18 NOTE — Discharge Summary (Signed)
NAME:  CHARVEZ, VOORHIES                 ACCOUNT NO.:  [id number]   MEDICAL RECORD NO.:  [id number]          PATIENT TYPE:  INP   LOCATION:  1411                         FACILITY:  [facility]   PHYSICIAN:  Peggye Pitt, M.D. DATE OF BIRTH:  10-14-1976   DATE OF ADMISSION:  04/10/2009  DATE OF DISCHARGE:  04/12/2009                               DISCHARGE SUMMARY   DISCHARGE DIAGNOSES:  1. Abdominal pain likely secondary to alcoholic gastritis.  2. Alcohol abuse.  3. Hypertension.  4. Hyperlipidemia.  5. Gout.   DISCHARGE MEDICATIONS:  1. Protonix 40 mg twice daily for 1 month then decrease to once daily.  2. Clonidine 0.1 mg twice daily.  3. Multivitamin 1 tablet daily.  4. Thiamine 100 mg daily.  5. Vicodin 5/500 mg 1-2 tablets every 6 hours as needed for pain.  I      have dispensed #45 tablets.   DISPOSITION AND FOLLOW UP:  Mr. Tenbrink is discharged home today in  stable condition.  Of note, I have asked care management to assist [place]  with securing him an appointment with [facility].  He will likely need  to be seen within the next 2-3 weeks.   CONSULTATIONS THIS HOSPITALIZATION:  None.   IMAGES AND PROCEDURES DURING THIS HOSPITALIZATION:  None.   HISTORY AND PHYSICAL EXAM:  For full details, please see dictation by  Dr. Waymon Amato on April 10, 2009, but, in brief, Mr. Kana is a pleasant 35-  year-old African-American man with a history of alcohol and  polysubstance abuse who presented to the hospital with intractable  nausea, vomiting and abdominal pain.  He is an alcoholic.  He says he  drinks up to one pint of liquor and 4-5 beers per day on average.  His  last was about 8 days ago.  Approximately 6 days prior to admission, he  started having nausea, vomiting and epigastric pain.  For this reason,  he came into the hospital for further evaluation and management.   HOSPITAL COURSE BY ACTIVE PROBLEM:  1. Abdominal pain likely secondary to alcoholic gastritis.  We have  placed him on twice daily PPIs.  His pain has resolved.  He is      tolerating a full diet.  He has been strongly encouraged to pursue      alcohol cessation.  He has denied any help for an inpatient detox      program at this time.  If this recurs, he may need to have a GI      consultation for an upper endoscopy.  2. For his alcohol abuse, he has been maintained on thiamine, folate,      as well as an alcohol withdrawal protocol while in the hospital.  3. For his hypertension, his blood pressures have been slightly      elevated in the 150-160 range; however, I suspect this may be      secondary to pain from his gouty arthritis in his left knee.      Hence, I have not increased his blood pressure medication.  He is  willing to be closely followed in the outpatient setting.  4. For his hyperlipidemia, I definitely question this diagnosis, as he      is not on a statin and his fasting lipid panel shows a total      cholesterol of 138, triglycerides of 91, an HDL of 44 and LDL of      76.  5. For his gout, he chronically uses colchicine and indomethacin;      however, because of his acute alcoholic gastritis, I will give him      a prescription for Vicodin instead.  He is willing to follow up      with his primary care physician for further issues.  6. The rest of the chronic medical conditions have not been a problem      this hospitalization.   VITAL SIGNS UPON DISCHARGE:  Blood pressure 154/90, heart rate 75,  respirations 20, O2 saturations 100% on room air with a temperature of  98.1.   LABORATORIES ON DAY OF DISCHARGE:  Sodium 138, potassium 4.2, chloride  110, bicarbonate 20, BUN 9, creatinine 0.91, glucose of 128, albumin of  2.8.  WBCs 10.4, hemoglobin 13.1 and a platelet count of 200.      Peggye Pitt, M.D.  Electronically Signed     EH/MEDQ  D:  04/12/2009  T:  04/12/2009  Job:  [id number]

## 2011-03-18 NOTE — H&P (Signed)
NAME:  Richard Lopez, Richard Lopez                 ACCOUNT NO.:  [id number]   MEDICAL RECORD NO.:  [id number]          PATIENT TYPE:  OBV   LOCATION:  0101                         FACILITY:  [facility]   PHYSICIAN:  Mobolaji B. Bakare, M.D.DATE OF BIRTH:  03-09-1976   DATE OF ADMISSION:  09/01/2007  DATE OF DISCHARGE:                              HISTORY & PHYSICAL   PRIMARY CARE PHYSICIAN:  Unassigned.   CHIEF COMPLAINT:  Abnormal labs.   HISTORY OF PRESENTING COMPLAINT:  Richard Lopez is a 35 year old African-  American male with history of alcohol abuse.  He had an alcohol binge on  Friday, five days ago.  He developed abdominal cramps, nausea, and  vomiting on Saturday and Sunday.  He went to Urgent Care yesterday.  He  was given IV fluids and treated for gastritis with Prilosec OTC.  Abdominal pain has resolved.  He is no longer vomiting or having  diarrhea.  He had some blood work done yesterday, the results which were  stated to be abnormal.  The patient was called to come to the emergency  room today.  Abnormal results were serum glucose 363, BUN 32, creatinine  3.94, sodium 123, chloride 33, calcium 12.5, bicarb 13.  Serum albumin  6.2, AST 93, ALT 122.  White cell count 12.4.   The patient currently has no complaints.  He denies abdominal pain,  nausea, vomiting.  He has been able to eat and is tolerating his meals.  He has no diarrhea.  He did not have hematemesis or hematochezia.  There  was no syncope.   REVIEW OF SYSTEMS:  There is no fever, chills, cough, chest pain,  orthopnea, or PND.  He has no dysuria or urgency.   PAST MEDICAL HISTORY:  1. Alcohol abuse.  He states that he has cut back remarkably on his      alcohol intake.  2. Diabetes mellitus.  The patient is currently not on any diabetic      medication.  He states that he did not realize he needed to      continue using Amaryl.  He does not have a regular physician.  3. History of gastritis related to alcohol  4. History of  hyponatremia/hypokalemia related to nausea and vomiting.  5. Hypertension.  6. Hyperlipidemia.   CURRENT MEDICATIONS:  Prilosec.   ALLERGIES:  No known drug allergies.   PAST SURGICAL HISTORY:  None.   SOCIAL HISTORY:  The patient denies tobacco abuse.  He drinks alcohol,  about three beers and vodka twice a week with occasional binges.  He  used to drink alcohol daily in the past.  He smokes cannabis.  He is  employed as a Lawyer.   FAMILY HISTORY:  Father passed away from complications of Alzheimer's,  was diabetic.  Mother is well and alive.   PHYSICAL EXAMINATION:  VITAL SIGNS:  Initial vitals in the emergency  room:  Temperature 97.2, blood pressure 142/100, pulse 114, respiratory  rate 18, O2 saturations of 98%.  A repeat blood pressure showed 163/92  and repeat pulse is 77.  GENERAL:  On examination the patient is awake, alert, oriented in time,  place, and person.  HEENT:  Normocephalic, atraumatic.  Pupils equal, round, and reactive to  light. small, 3-4 mm.  Mucous membranes dry.  No thrush.  Mucous  membranes moist.  LUNGS:  Clear clinically to auscultation.  CARDIOVASCULAR:  S1/S2, regular.  ABDOMEN:  Not distended.  Soft, nontender.  No palpable organomegaly.  Bowel sounds present.  EXTREMITIES:  No pedal edema or calf tenderness.  Dorsalis pedis pulses  are full bilaterally.  CENTRAL NERVOUS SYSTEM:  No focal neurological deficit.   LABORATORY DATA:  Urinalysis shows specific gravity 1.01, pH 6, large  blood, protein 30, negative for nitrites and leukocyte esterase.  Microscopic:  3-6 red blood cells, few bacteria.  No comment made on  white cells.  Sodium 122, potassium 3.4, chloride 84, bicarb 25, glucose  146, BUN 23, creatinine 1.46, bilirubin 1.3, alkaline phosphatase 67,  AST 234, ALT 104, total protein 8.8, albumin 5, calcium 10.2.  Lipase  53.  White cells 9.3, hemoglobin 16.8, hematocrit 47.8, platelets 145,  neutrophils 66.    ASSESSMENT/PLAN:  Richard Lopez is a 35 year old African-American male with  alcohol abuse presenting with abnormal labs, which was proceeded by  nausea, vomiting, and abdominal cramping.  He will be admitted for 24-  hour observation for the evaluation and he would be hydrated.   ADMISSION DIAGNOSES:  1. Hyponatremia, most likely secondary to recent nausea and vomiting.      Will start IV fluid normal saline at 150 ml per hour.  Check his      urine osmolality, urine sodium, and sodium osmolality.  Will give      Phenergan p.r.n. for nausea.  Will repeat BMET in the morning.  2. Hypokalemia.  Will replete with potassium in IV fluids and      supplement 40 mEq p.o. x1.  3. Abnormal liver function tests.  The pattern of AST/ALT is      suggestive of alcohol-induced liver enzyme abnormality.  Will      nevertheless check his ultrasound of the abdomen to assess for      ascites and elevate the liver further.  Check hepatitis B and C      serology.  4. Alcohol abuse with probable gastritis.  The patient will be offered      alcohol cessation counseling.  We will give supplemental thiamine,      multivitamin, and folate.  Protonix 40 mg daily.  Will place on      Ativan 1-2 mg p.o./IV p.r.n. for agitation.  He denies ever being      in withdrawal whenever he had Etoh binge.  5. Diabetes mellitus.  His blood glucose is 147 and notably blood      glucose on labs drawn yesterday was over 300 and he had a bicarb of      13, but these have resolved.  He will be monitored with q.a.c. and      h.s. blood glucose check and start sliding scale insulin with      sensitive protocol.  His urine ketones are negative.  He may      probably do well on oral hypoglycemic agent.  6. Hypertension.  He does have history of hypertension.  Blood      pressure is elevated.  Will start on Avapro, maximum low dose, 150      mg daily, and monitor his BMET.  His creatinine has corrected in  the last 24 hours.  7.  Thrombocytopenia, likely secondary to alcohol abuse.  Will monitor      platelets during this hospitalization.      Mobolaji B. Corky Downs, M.D.  Electronically Signed    MBB/MEDQ  D:  09/01/2007  T:  09/01/2007  Job:  [id number]
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
## 2011-03-18 NOTE — H&P (Signed)
NAME:  Richard Lopez, Richard Lopez                 ACCOUNT NO.:  [id number]   MEDICAL RECORD NO.:  [id number]          PATIENT TYPE:  INP   LOCATION:  0102                         FACILITY:  [facility]   PHYSICIAN:  Marcellus Scott, MD     DATE OF BIRTH:  1975-11-11   DATE OF ADMISSION:  04/10/2009  DATE OF DISCHARGE:                              HISTORY & PHYSICAL   PRIMARY MEDICAL DOCTOR:  Unassigned.   CHIEF COMPLAINTS:  Intractable nausea, vomiting and epigastric abdominal  pain.   HISTORY OF PRESENT ILLNESS:  [name] is a 35 year old African  American male patient with history of alcohol abuse, substance abuse,  hypertension, diet-controlled type 2 diabetes mellitus, alcoholic  gastritis who presents with intractable nausea, vomiting and abdominal  pain.  The patient is a self-proclaimed alcoholic.  He says he drinks  heavily up to half to one pint of liquor and four to five beers per day  on average.  He last drank eight days ago.  Approximately six days ago,  the patient started having nausea, vomiting and epigastric pain.  However, this lasted overnight and subsided for a day and a half to two  days.  Over the weekend, the patient again had a couple of bouts of  nausea, vomiting and abdominal pain only to subside.  However, since  Monday night, the patient has noted intractable nausea and vomiting.  The vomitus initially was clear liquid.  Subsequently has been bilious.  There is no associated blood or coffee grounds.  The patient complains  of 8/10 epigastric, nonradiating, burning pain.  Since being in the  emergency room, he says his abdominal pain is much better.  He had a BM  yesterday and is passing flatus today.  No malena. He denies any fevers,  chills or rigors.  The patient was admitted with similar complaints in  April of this year.   PAST MEDICAL HISTORY:  1. Alcohol abuse with associated hepatitis and gastritis.  2. Hypertension.  3. Hyperlipidemia.  4. Previous acute renal  failure.  5. Fatty liver.  6. Borderline type 2 diabetes mellitus.   PAST SURGICAL HISTORY:  None.   ALLERGIES:  No known drug allergies.   MEDICATIONS:  Prilosec over-the-counter 1 tablet p.o. daily.   SOCIAL HISTORY:  The patient abuses alcohol as above, marijuana.  He  smokes two cigarettes per day.  He has a girlfriend.  He is independent  of activities of daily living.  He is unemployed since October of last  year.  [place]   FAMILY HISTORY:  The patient's mother has history of hypertension and  CVA.  There is no family history of coronary artery disease, diabetes or  cancer.   REVIEW OF SYSTEMS:  Comprehensive 10-system review done and apart from  history of presenting illness is noncontributory.  The patient possibly  has polyuria but no dysuria.  No chest pain, palpitations, dyspnea.  No  headache.  No visual symptoms.  No asymmetrical limb weakness or  numbness or slurred speech.   PHYSICAL EXAMINATION:  [name] is a  moderately built and nourished  male patient who is in no obvious distress.  VITAL SIGNS:  Temperature 98.2 degrees Fahrenheit, blood pressure on  arrival 162/120 mmHg, pulse 119 per minute, respirations 22 per minute  and saturating at 96% on room air.  HEAD: nontraumatic, normocephalic.  EYES:  Pupils equally reacting to light and accommodation.  ENT:  With dry oral mucosa.  Bilateral enlarged noninflamed tonsils.  There is no acetone smell on the breath.  NECK:  Supple.  No JVD or carotid bruit.  RESPIRATORY SYSTEM:  Clear to auscultation bilaterally.  CARDIOVASCULAR SYSTEM:  First and second heart sounds heard, regular,  tachycardic.  No murmurs.  ABDOMEN:  Is nondistended, nontender, soft.  No organomegaly or mass  appreciated.  Bowel sounds are normally heard.  CENTRAL NERVOUS SYSTEM:  The patient is awake, alert, oriented x4 with  no focal neurological deficits.  EXTREMITIES:  With no cyanosis, clubbing or edema.   Peripheral pulses  symmetrically felt.  SKIN:  The patient has a tattoo on the upper back but otherwise  unremarkable.  MUSCULOSKELETAL SYSTEM:  Unremarkable.   LABORATORY DATA:  With urine microscopy, 3-6 white blood cells per high-  power field and many bacteria, 500 mg/dL of glucose, trace ketones,  small amount of blood, 100 mg/dL of protein, lipase 32, blood alcohol  level less than 5.  Comprehensive metabolic panel remarkable for sodium  127, potassium 3, chloride 88, bicarb 21, glucose 400, BUN 21,  creatinine 3.05.  Total protein 9.7, albumin 4.7, calcium 10.6.  CBCs  with hemoglobin 16.4, hematocrit 48.2, white blood cell 13.4, platelets  241.   ASSESSMENT AND PLAN:  1. Intractable nausea, vomiting and epigastric pain secondary to      alcoholic gastritis.  Will make the patient nil per oral except for      ice chips and medications.  Hydrate with IV fluids.  Place on      b.i.d. intravenous proton pump inhibitors and antiemetics.  2. Dehydration secondary to problem #1.  IV fluids.  3. Uncontrolled type 2 diabetes mellitus.  Possible mild diabetic      ketoacidosis.  Aggressive IV fluid hydration.  Sliding scale      insulin.  With sliding scale insulin, the patient's sugars are down      to 338 mg/dL.  Follow basic metabolic panel closely.  4. Accelerated hypertension.  The patient came in with blood pressure      of 162/120 mmHg but with hydralazine is down to 149/112 mmHg.  Will      place on clonidine p.o. and p.r.n. IV hydralazine.  5. Hypokalemia secondary to vomiting.  Will replete and check      magnesium levels.  6. Hyponatremia secondary to dehydration, vomiting and      pseudohyponatremia from hyperglycemia.  Management as above and      follow basic metabolic panel closely.  7. Acute nonoliguric renal failure.  Aggressive IV fluid hydration,      monitor strict input and output charting, and monitor basic      metabolic panel closely.  8. Possible urinary  tract infection.  The patient has received      Rocephin in the ED.  Continue same and follow urine cultures.  9. Tobacco abuse.  Cessation counseling done.  The patient declines      patch.  10.Substance abuse.  Again cessation counseling done.  11.Alcohol abuse.  The patient had last drink more than a week ago.  He does not demonstrate withdrawal signs.  Will place on p.r.n.      Ativan for withdrawal symptoms.  Will check CIWA score.  Will place      on multivitamins      and thiamine.  Will also provide banana bag.  12.Proteinuria: consider further evaluation   Time taken in coordinating this discharge is 1 hour.      Marcellus Scott, MD  Electronically Signed     AH/MEDQ  D:  04/10/2009  T:  04/10/2009  Job:  [id number]   cc:   Melvern Banker  Fax: [phone]

## 2011-03-18 NOTE — Discharge Summary (Signed)
NAME:  Richard Lopez, Richard Lopez                 ACCOUNT NO.:  [id number]   MEDICAL RECORD NO.:  [id number]          PATIENT TYPE:  OBV   LOCATION:  1439                         FACILITY:  [facility]   PHYSICIAN:  Elliot Cousin, M.D.    DATE OF BIRTH:  16-Jul-1976   DATE OF ADMISSION:  09/01/2007  DATE OF DISCHARGE:  09/03/2007                               DISCHARGE SUMMARY   DISCHARGE DIAGNOSES:  1. Nausea, vomiting and abdominal pain, probably secondary to      alcoholic gastritis.  2. Hyponatremia with a serum sodium of 122 at the time of hospital      admission.  3. Hypokalemia.  4. Transaminitis/alcoholic hepatitis.  5. Alcohol abuse.  6. Hypertension.  7. Diet-controlled type 2 diabetes mellitus.  8. Thrombocytopenia, most likely secondary to alcohol abuse.  9. Hyperlipidemia.  (A statin  was not started because of the      patients alcoholic hepatitis.)   DISCHARGE MEDICATIONS:  1. Avapro 75 mg daily.  2. Protonix 40 mg daily.  3. Multivitamin with iron once daily.   DISCHARGE DISPOSITION:  The patient was discharged to home in improved  and stable condition.  He was advised to follow up with his primary care  physician, Dr. Abbe Amsterdam, at  [facility].   CONSULTATIONS:  None.   PROCEDURE PERFORMED:  Ultrasound of the abdomen on September 02, 2007.  The results revealed no acute intra-abdominal findings.  No  cholelithiasis or cholecystitis.  Increased echogenicity of the liver,  likely representing hepatic steatosis.  No intrahepatic or extrahepatic  biliary ductal dilatation.  No ascites.   HISTORY OF PRESENT ILLNESS:  The patient is a 35 year old man with a  past medical history significant for diet-controlled diabetes mellitus,  alcohol abuse, and hypertension.  He presented to the emergency  department on September 01, 2007, after he was advised to do so by his  physician at  [facility].  Apparently a few days prior to his  presentation to [facility] emergency department, he  was seen  by a physician at the [facility] for nausea, vomiting and abdominal  pain.  The patient was advised to take Prilosec over-the-counter which  he took.  Apparently the physician at the [facility] ordered blood work.  He was advised to come to the emergency department when his lab results  revealed that his serum glucose was 363, his BUN was 32, his creatinine  was 3.96, and his sodium was 123.  The patient was, therefore, admitted  for further evaluation and management.   For additional details, please see the dictated History and Physical.   HOSPITAL COURSE:  #1.  ABDOMINAL PAIN, NAUSEA, AND VOMITING, MOST LIKELY SECONDARY TO  ACUTE ALCOHOLIC GASTRITIS:  At the time of the initial hospitalization,  the patient really had no complaints of abdominal pain, nausea or  vomiting.  Most of his symptoms had already subsided.  However, he was  started on empiric treatment with Protonix and symptomatic treatment  with Phenergan and Zofran as needed.  The patient was started on a  carbohydrate-modified diet which he  tolerated well during the hospital  course.  The patient was discharged home on Protonix 40 mg daily.   #2.  ALCOHOLIC HEPATITIS AND TRANSAMINITIS WITH A HISTORY OF ALCOHOL  ABUSE:  The patient's liver transaminases at the time of the initial  assessment revealed a total bilirubin of 1.2, alkaline phosphatase of  55, SGOT of 235, and SGPT of 95.  For further evaluation, an ultrasound  of the abdomen and a viral hepatitis panel were ordered.  The ultrasound  of the abdomen revealed evidence of fatty liver.  However, there were no  other abnormalities.  As of today, the viral hepatitis panel reveals a  negative hepatitis B core antibody and a negative hepatitis C antibody.  Prior to hospital discharge, his SGOT increased slightly to 279, and his  SGPT increased slightly to 101.  His alkaline phosphatase and total  bilirubin remained within normal limits.  The patient had no  right upper  quadrant abdominal tenderness on exam.  More than likely, the  transaminitis was secondary and is secondary to acute and chronic  alcohol use.   The patient was informed of the results of the ultrasound and the lab  results.  He was encouraged to seek assistance with regards to alcohol  cessation.  The social worker was consulted and provided the patient  with numerous programs in [place] to assist with treatment of  alcohol abuse.  The patient was very receptive.  In addition, the  patient was treated with multivitamins and as-needed Ativan.  He did not  demonstrate any evidence of alcohol withdrawal symptoms during the  hospital course.   #3.  HYPOKALEMIA AND HYPONATREMIA:  As indicated above, the patient's  serum sodium was 122, and his serum potassium was 3.3 at the time of the  initial hospital assessment.  The patient was repleted with normal  saline and potassium chloride in the IV fluids.  Over the course of the  hospitalization, his sodium improved to 132, and his serum potassium  improved to 3.4.  He was discharged to home following an additional 40  mEq of potassium chloride by mouth.  Of note, his urine osmolality was  low at 268, and his serum osmolality was within normal limits at 277.   #4.  HYPERTENSION:  The patient admitted that he does have a history of  hypertension.  However, after he lost weight ( approximately 300 pounds  intentionally), he no longer needed antihypertensive medications.  During the hospital course, however, his systolic blood pressure ranged  from 150-170.  The patient also mentioned that when he drinks,  particularly when he binges on alcohol, his blood pressure generally  increases.  When he stops drinking, his blood pressure tends to  normalize.  Nevertheless, the patient was treated with Avapro and  metoprolol during hospitalization.  However, at the time of hospital  discharge, it was decided to change the regimen to  Avapro alone.   #5.  TYPE 2 DIABETES MELLITUS:  The the patient's venous glucose was 146  at the time of the initial hospital assessment.  This was not fasting.  Over the course of the hospitalization, his capillary blood glucose  ranged from 125-145 fasting.  His hemoglobin A1c was measured at 6.5.  The patient was treated with sliding scale NovoLog during hospital  course.  However, he was not discharged to home on insulin.  Rather, the  patient was counseled on a carbohydrate-modified diet.  He was advised  to follow this diet,  exercise, and to watch his weight.  The patient  was strongly advised to have his blood glucose reassessed by his primary  care physician in 1 week.  The patient voiced understanding.   #6.  HYPERLIPIDEMIA:  The patient's lipid profile was assessed, and his  total cholesterol was found to be elevated at 225. His triglyceride  level was 99. His HDL was 95, and his LDL was 110.  Although his total  cholesterol and LDL cholesterol were elevated, a statin  medication  was not started because of the alcoholic hepatitis.  The patient was  counseled on a carbohydrate-modified diet which, in essence, is a heart-  healthy diet.  Again, the patient was encouraged to restart exercising  and to watch his weight.   #7.  THROMBOCYTOPENIA:  The patient's platelet count was 145 at the time  of the initial hospital assessment.  Prior to hospital discharge, it was  159.  More than likely, the thrombocytopenia was secondary to alcohol  abuse.      Elliot Cousin, M.D.  Electronically Signed     DF/MEDQ  D:  09/03/2007  T:  09/04/2007  Job:  [id number]

## 2011-03-18 NOTE — H&P (Signed)
NAME:  Richard Lopez, Richard Lopez                 ACCOUNT NO.:  [id number]   MEDICAL RECORD NO.:  [id number]          PATIENT TYPE:  INP   LOCATION:  0110                         FACILITY:  [facility]   PHYSICIAN:  Della Goo, M.D. DATE OF BIRTH:  [birth year]   DATE OF ADMISSION:  06/29/2009  DATE OF DISCHARGE:                              HISTORY & PHYSICAL   DATE OF ADMISSION:  June 29, 2009.   PRIMARY CARE PHYSICIAN:  [facility].   CHIEF COMPLAINT:  Nausea and vomiting and abdominal pain.   HISTORY OF PRESENT ILLNESS:  This is a 35 year old male who presents to  the emergency department with complaints of nausea and vomiting as well  as crampy abdominal pain for the past 3 days.  He denies having any  fevers, chills.  He reports not being able to hold down foods and  liquids.  He has had multiple similar admissions for the same types of  symptoms.  The patient was seen in the emergency department, evaluated  and was found to be hyperglycemic with a glucose level of 383 along with  an elevated BUN and creatinine of 27 over 3.15.  He was referred for  admission.  The patient denies having any hematemesis, melena passage,  diarrhea or hematochezia..  The patient has a history of alcoholism but  denies drinking in the past month.  In the past, he has been admitted as  well for alcoholic hepatitis and gastritis.  The patient was last  hospitalized April 10, 2009, and discharged on April 12, 2009.   PAST MEDICAL HISTORY:  Significant for:  1. Diabetes mellitus.  2. Hypertension.  3. Hyperlipidemia.  4. Alcoholism.  5. Alcoholic hepatitis.  6. Alcoholic gastritis in the past.  7. Previous acute renal failure.   PAST SURGICAL HISTORY:  None.   MEDICATIONS:  None.   ALLERGIES:  NO KNOWN DRUG ALLERGIES.   SOCIAL HISTORY:  The patient smokes 2 cigarettes daily but he also  smokes marijuana twice weekly.  He denies any recent alcohol usage,  states that he last drank over 1 month ago.   FAMILY HISTORY:  Positive for hypertension and cerebrovascular accident  in his mother.   REVIEW OF SYSTEMS:  Pertinents mentioned above in the HPI.  All other  organ systems are negative.   PHYSICAL EXAMINATION:  GENERAL:  This is a 35 year old well-nourished,  well-developed male in discomfort but no acute distress.  VITAL SIGNS:  Temperature 97.8, blood pressure 128/100 initially, heart  rate 115, respirations 18, O2 saturations 97%-100%.  HEENT:  Normocephalic, atraumatic.  Pupils equally round, reactive to  light.  Extraocular movements are intact.  Funduscopic benign.  There is  no scleral icterus.  Nares are patent bilaterally.  Oropharynx has a  pasty yellowish tongue exudate.  No erythema.  No oral lesions seen.  NECK:  Supple, full range of motion.  No thyromegaly, adenopathy,  jugular venous distention.  CARDIOVASCULAR:  Mild tachycardia.  No murmurs, gallops or rubs  appreciated.  LUNGS:  Clear to auscultation bilaterally.  No rales, rhonchi or wheezes  appreciated.  ABDOMEN:  Positive bowel sounds, soft, mildly tender in the epigastrium.  There is no rebound or guarding.  There is no hepatosplenomegaly.  EXTREMITIES:  Without cyanosis, clubbing or edema.  GENITOURINARY:  Deferred.  BACK:  No costovertebral angle tenderness.  NEUROLOGIC:  The patient is alert and oriented x3.  Cranial nerves are  intact.  Motor and sensory function also intact.  There are no focal  deficits on examination.  Gait was not assessed.   LABORATORY STUDIES:  White blood cell count 9.8, hemoglobin 16.6,  hematocrit 48.7, MCV 89.3, platelets 261, neutrophils 85%, lymphocytes  9%.  Sodium 120, potassium 3.7, chloride 82, carbon dioxide 19, BUN 27,  creatinine 3.15, glucose 383.  Lipase 54.  Please note that the  patient's previous BUN and creatinine on discharge June 10 was a BUN of  9 and a creatinine of 0.91.   ASSESSMENT:  A 35 year old male being admitted with:  1. Mild diabetic  ketoacidosis.  2. Acute renal failure.  3. Hyponatremia.  4. Abdominal pain.  5. Nausea and vomiting.   PLAN:  The patient will be admitted to a telemetry area and will be  placed on IV fluids for continued fluid resuscitation.  IV insulin has  been administered and the patient will be started on a basal insulin of  Lantus 5 units subcutaneous daily.  Sliding scale insulin coverage has  also been ordered.  The patient's electrolytes will be monitored for  further adjustments.  His BUN and creatinine will also be monitored as  well.  Antiemetic therapy and pain control therapy have also been  ordered.  A three-view of the abdomen will be ordered as well and  pending this result further imaging studies will be ordered.  The  patient will be placed on DVT and GI prophylaxis at this time.      Della Goo, M.D.  Electronically Signed     HJ/MEDQ  D:  06/29/2009  T:  06/29/2009  Job:  [id number]

## 2011-03-18 NOTE — Discharge Summary (Signed)
NAME:  Richard Lopez, Richard Lopez                 ACCOUNT NO.:  [id number]   MEDICAL RECORD NO.:  [id number]          PATIENT TYPE:  INP   LOCATION:  1424                         FACILITY:  [facility]   PHYSICIAN:  Marcellus Scott, MD     DATE OF BIRTH:  1976-06-17   DATE OF ADMISSION:  06/29/2009  DATE OF DISCHARGE:  07/02/2009                               DISCHARGE SUMMARY   PRIMARY CARE [name] Curro:  Financial trader.   DISCHARGE DIAGNOSIS:  1. Uncontrolled type 2 diabetes mellitus.  2. Metabolic acidosis, multifactorial.  Resolved.  3. Dehydration.  4. Acute renal failure, resolved.  5. Hypertension.  6. Hyponatremia, resolved.  7. Hypokalemia, repleted.  8. History of hyperlipidemia.  9. History of alcoholism with alcoholic hepatitis and gastritis.   DISCHARGE MEDICATIONS:  1. Glimepiride 1 mg p.o. daily.  2. Clonidine 0.1 mg p.o. b.i.d.  3. Prilosec 20 mg p.o. daily.  4. Multivitamins 1 p.o. daily.  5. Thiamine 100 mg p.o. daily.   PROCEDURES:  Acute abdominal series with chest x-ray on August 27.  Impression:  No acute abnormalities.   PERTINENT LABS:  Basic metabolic panel today unremarkable.  BUN 7,  creatinine 0.85.  CBC hemoglobin 12.6, hematocrit 37.2, white blood  cells 7, platelets 206,000.  Cardiac panel cycled and negative for acute  coronary syndrome.  Hemoglobin A1c 6.9, TSH 0.416.  Urinalysis with 11-  20 RBCs, many bacteria 100 mg/dL of protein, large blood and trace  ketones lipase 54.   CONSULTATIONS:  Diabetes treatment program.   HOSPITAL COURSE AND PATIENT DISPOSITION:  Richard Lopez is a pleasant 35-  year-old [place] American male patient with history of alcohol abuse,  diabetes mellitus, alcoholic hepatitis and gastritis, hypertension and  hyperlipidemia who presented with abdominal pain, nausea and vomiting,  inability to hold food down.  He had similar admissions in the past.  His glucose was 383 with creatinine of 3.15.  He was, therefore,  admitted for  further evaluation and management.   PROBLEM LIST:  1. Uncontrolled type 2 diabetes mellitus.  The patient was admitted to      the hospital.  He was aggressively hydrated with isotonic saline.      He was placed on insulin.  His basic metabolic panels were      frequently followed.  With these measures the patient became      asymptomatic of his nausea, vomiting, abdominal pain.  His glycemic      controls improved.  There was some confusion in the patient's mind      regarding whether he was truly a diabetic or not.  After reviewing      the data, it is clear that the patient has type 2 diabetes      mellitus.  He has family history of diabetes mellitus.  He      initially came in with mild ketoacidosis which has also resolved.      We think that he may have type 2 diabetes mellitus rather than type      1 and ketoacidosis is more from his dehydration ketosis versus  alcoholic ketoacidosis and starvation ketoacidosis from the nausea      and vomiting.  He will be sent home on oral hypoglycemic agents.      He has been educated regarding hypoglycemia symptoms and      management.  He has been counseled regarding compliance with      medications, CBG checks and follow up with M.D.  He verbalizes      understanding.  2. Ketoacidosis.  This probably was multifactorial secondary to      alcoholic ketoacidosis, starvation ketoacidosis, and do not believe      that there was a significant component of diabetic ketoacidosis.      This resolved with hydration.  3. Acute renal failure.  This was nonoliguric and has resolved with      hydration.  4. Uncontrolled hypertension.  Ideally, this patient needs to be on an      ACE inhibitor given his diabetic status and proteinuria.  However,      he is just coming off an episode of acute renal failure.  Recommend      followup with his primary M.D. and consider starting him on low-      dose ACE and then titrating and follow up his basic  metabolic      panel.  5. History of alcohol abuse.  The patient indicates he has given up      alcohol a couple of months ago.  Again cessation counseling done      and counseled to remain off alcohol, and the patient is to continue      multivitamins.  He can also continue proton pump inhibitors.   DISPOSITION:  At this time the patient is stable for discharge.  Recommend followup with his primary medical doctor in the next 1-2  weeks.   Time taken in coordinating this discharge is less than 30 minutes.      Marcellus Scott, MD  Electronically Signed     AH/MEDQ  D:  07/02/2009  T:  07/02/2009  Job:  [id number]   cc:   Melvern Banker  Fax: [phone]

## 2011-03-18 NOTE — H&P (Signed)
NAME:  Richard Lopez, Richard Lopez                 ACCOUNT NO.:  [id number]   MEDICAL RECORD NO.:  [id number]          PATIENT TYPE:  EMS   LOCATION:  ED                           FACILITY:  [facility]   PHYSICIAN:  Peggye Pitt, M.D. DATE OF BIRTH:  December 01, 1975   DATE OF ADMISSION:  11/21/2008  DATE OF DISCHARGE:                              HISTORY & PHYSICAL   PRIMARY CARE PHYSICIAN:  He is unassigned.   CHIEF COMPLAINT:  Nausea and vomiting.   HISTORY OF PRESENT ILLNESS:  Richard Lopez is a 35 year old African  American male who has a history of alcohol abuse, who states that for  the past 4 days, he has been binge-drinking and comes in today with  nausea and vomiting.  He has had multiple episodes of vomiting today, at  least 8 episodes a day, consistent with clear fluid and whatever p.o. he  can take.   He denies any abdominal pain, fever, chills, chest pain, shortness of  breath, or any other symptoms.  Because of his acute renal failure and  mild dehydration, the ED has asked [place] to admit him for observation.   ALLERGIES:  He has no known drug allergies.   PAST MEDICAL HISTORY:  1. Diet-controlled diabetes mellitus.  2. Alcoholic gastritis.  3. Hypertension.  4. Hyperlipidemia.   HOME MEDICATIONS:  He is only on Prilosec 20 mg daily.   SOCIAL HISTORY:  He denies any tobacco or illicit drug use.  He does  admit to being a heavy alcohol drinker.  He states that he drinks a  fifth to a pint of vodka every day, even more in the past 3-4 days.   FAMILY HISTORY:  Noncontributory.   REVIEW OF SYSTEMS:  Negative except as already mentioned in HPI.   PHYSICAL EXAMINATION:  VITAL SIGNS ON ADMISSION:  Blood pressure  169/109, heart rate 117, respirations 20, O2 sats 98% on room air with a  temp of 98.6.  GENERAL:  He is awake, alert and oriented x3.  Vomiting during my  examination.  HEENT:  Normocephalic and atraumatic.  Pupils are equal, round and  reactive to light and accommodation.  LUNGS:  Clear bilaterally.  HEART:  Tachycardic; however, with a regular rhythm.  No murmurs, rubs  or gallops identified.  ABDOMEN:  Soft, nontender, nondistended with positive bowel sounds.  EXTREMITIES:  He has no edema.  Positive pulses.  NEUROLOGIC:  Grossly intact and nonfocal.   LABS UPON ADMISSION:  Sodium 132, potassium 4, chloride 91, bicarb 24,  BUN 15, creatinine 1.60, glucose 281.  Bilirubin 1.6.  Alk phos 77.  AST  67.  ALT 72.  Albumin 5.  WBCs 10.2, hemoglobin 15.9, platelets 155.  Lipase 33.  Urinalysis that is negative.  Alcohol level of less than 5.   He had an acute abdominal series that showed no acute findings.   ASSESSMENT/PLAN:  1. Nausea and vomiting, likely secondary to his binge-drinking and      possibly alcoholic gastritis. Will admit for observation, IV      fluids, and twice-daily PPI.  2. Acute renal failure, which  is likely secondary to volume depletion      secondary to his drinking and vomiting.  Would start him on IV      fluids.  If his creatinine is not trending down, we may need to      pursue a more aggressive workup of his acute renal failure.  3. For his transaminitis, likely secondary to his alcohol abuse.      However, will check a hepatitis panel and may also need to do a      right upper quadrant ultrasound.  4. For his hyperlipidemia, will check a fasting lipid panel.  For his      diet-controlled diabetes mellitus, will check a hemoglobin A1C,      start him on sliding-scale insulin while in the hospital.  5. For his hypertension, he is not on any medications at home.  He is      a diabetic.  Will start him on an ACE inhibitor; however, will need      to closely monitor his renal function.  6. Prophylaxis while in the hospital:  Place on Protonix for GI      prophylaxis and on Lovenox for DVT prophylaxis.      Peggye Pitt, M.D.  Electronically Signed     EH/MEDQ  D:  11/21/2008  T:  11/21/2008  Job:  [id number]

## 2011-03-21 NOTE — H&P (Signed)
NAME:  Richard, Lopez NO.:  [id number]   MEDICAL RECORD NO.:  [id number]          PATIENT TYPE:  INP   LOCATION:  0102                         FACILITY:  [facility]   PHYSICIAN:  Sherin Quarry, MD      DATE OF BIRTH:  05/15/1976   DATE OF ADMISSION:  06/03/2006  DATE OF DISCHARGE:                                HISTORY & PHYSICAL   Richard Lopez is a 35 year old man who states that he has a longstanding  history of alcohol abuse.  He states that he will go for periods of time  without drinking but then will go on binges where he consumes a large amount  of alcohol.  For the last two weeks, he has been consuming very large  amounts of beer and vodka.  He has somewhat difficulty quantitating the  amounts.  He says that he last consumed any alcohol Monday morning and has  not drank since then because of nausea and weakness.  He states he has never  been in detox in the past.  He has had a feeling of shakiness in the past  when he has not consumed alcohol and he does not recall any history of DTs  or blackout spells.  He presented to [facility] at about 10 p.m.  tonight mostly because he was feeling very weak and dizzy.  He specifically  stated that he did not want to undergo detox.  He indicated that he had some  vomiting yesterday but none today.  He has not had any diarrhea.  On  presentation to the emergency room, his blood pressure was 131/100, pulse  was 130, O2 saturation 98%.   Laboratory studies were remarkable for a white count of 17,000, hemoglobin  of 18.3.  Blood alcohol level was negative.  The sodium was 121, potassium  3.0, CO2 21, glucose was 323, creatinine was 5.9, BUN was 31.  In light of  these findings, the patient was admitted for evaluation of chronic alcohol  abuse, new onset of diabetes and renal dysfunction.  On further questioning,  the patient states that about 5 years ago he was told that he had high blood  pressure.  He took medicine  for his blood pressure for about a month and  then has not taken any medicine for his blood pressure since that time.  It  is also significant that his father had a history of diabetes, developed  renal failure, was on dialysis, had a kidney transplant and eventually died.  He denies any recent history of headache, breathing difficulty, chest pain  abdominal pain, hematemesis, melena, urinary frequency or hesitancy.   PAST MEDICAL HISTORY:  Medications are none.  Allergies are none.  Operations are none.   HOSPITALIZATIONS:  None.   FAMILY HISTORY:  See above.  The patient's mother had a stroke.   SOCIAL HISTORY:  The patient smokes marijuana frequently.  He does not smoke  cigarettes.  He states he does not abuse any other drugs besides marijuana  and alcohol.  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   REVIEW OF SYSTEMS:  HEAD:  He denies headache or dizziness.  EYES:  He  denies visual blurring or diplopia.  EAR, NOSE AND THROAT:  Denies earache,  sinus pain or sore throat.  CHEST:  Denies coughing, wheezing or chest  congestion.  CARDIOVASCULAR:  Denies orthopnea, PND or ankle edema.  GI:  See above.  GU:  See above.  NEURO:  No history of seizure or stroke.  ENDO:  See above.   PHYSICAL EXAM:  Currently, his blood pressure is 167/119, pulse 102,  respirations 20.  HEENT exam is within normal limits.  The chest is clear.  Examination of the back reveals no CVA or point tenderness.  Cardiovascular  exam shows normal S1-S2 without rubs, murmurs or gallops.  The abdomen is  benign with normal bowel sounds.  No masses or tenderness.  No guarding or  rebound.  Neurologic testing examination extremities is normal.   IMPRESSION:  1.  Acute renal failure:  It is very possible this patient has an element of      chronic renal failure underlying as there is very little previous      medical care.  2.  Apparent new onset of diabetes.  3.  Hypertension.  4.  Chronic  alcohol abuse at risk for delirium tremens.   PLAN:  1.  IV fluids.  2.  Check magnesium level.  3.  Replace potassium.  4.  Ativan protocol with thiamine.  5.  Sliding-scale insulin.  6.  Renal ultrasound.  7.  Follow BMET closely.  8.  Explore alcohol rehab.  9.  Arrange primary care if possible.           ______________________________  Sherin Quarry, MD     SY/MEDQ  D:  06/04/2006  T:  06/04/2006  Job:  [id number]

## 2011-03-21 NOTE — Discharge Summary (Signed)
NAME:  Richard Lopez, Richard Lopez                 ACCOUNT NO.:  [id number]   MEDICAL RECORD NO.:  [id number]          PATIENT TYPE:  INP   LOCATION:  1431                         FACILITY:  [facility]   PHYSICIAN:  Melissa L. Ladona Ridgel, MD  DATE OF BIRTH:  [birth year]   DATE OF ADMISSION:  06/12/2006  DATE OF DISCHARGE:  06/15/2006                                 DISCHARGE SUMMARY   CHIEF COMPLAINT ON ADMISSION:  Nausea and vomiting.   DISCHARGING DIAGNOSES:  1. Nausea and vomiting, likely secondary to underlying gastritis.  The      patient underwent examination for pancreatitis, which was negative.  He      had a consultation with gastroenterology, who attempted to do an      esophagogastroduodenoscopy but they were unable to sedate him enough to      do the procedure.  The patient elected not to have the follow-up      esophagogastroduodenoscopy done and stated he would follow up as an      outpatient since he was feeling better.  2. Acute renal failure secondary to dehydration.  At the time of discharge      the patient's kidney function had improved to a significant degree.  3. Hyponatremia.  This was resolved.  4. Hypokalemia.  He remained with slightly low potassium levels, which      were repleted p.o. and IV.  5. Hypertension.  The patient had been off clonidine and his blood      pressure remained low during the course of this hospital stay.  He,      therefore, was not restarted on any medications.  6. Leukocytosis.  No infectious source was noted.  This was felt to be      secondary to stress demargination.  7. Diabetes.  The patient was newly diagnosed and was started on Amaryl 2      mg once daily, which was continued.  8. Hyperlipidemia.  The patient was continued on Zocor.   MEDICATIONS AT THE TIME OF DISCHARGE:  1. Amaryl 2 mg once daily.  2. Clonidine 0.1 mg twice daily.  3. Zocor 20 mg daily.  4. Protonix 40 mg daily.   The patient was given a referral for [facility] in one  week.   HOSPITAL COURSE:  The patient is a 34 year old African-American male who was  recently discharged from the hospital after being treated for alcohol abuse  and acute renal failure and new-onset diabetes.  The patient discharged to  home but continued to felt poorly.  He felt nausea and vomiting and came  into the hospital with hypokalemia, acute renal failure and hyponatremia.  The patient was admitted to the telemetry floor.  He as IV-rehydrated and  his electrolytes were replaced.  He improved significantly with basic  treatment.  Of note, his renal function was back at baseline on the day of  discharge.  He was started back on Protonix and, as stated, he underwent  evaluation by GI.  The patient had a failed EGD secondary to inability to  sedate, and he requested that  he be discharged to home to follow up as an  outpatient.  They recommended that he possibly pursue a gastric emptying  study because of the nature of his nausea and vomiting, which may be related  to gastroparesis.   Pertinent laboratory values at the time of discharge:  His urine culture was  negative.  His CBC revealed a white count of 7.7, which is within normal  limits, hemoglobin 12.8, hematocrit 37.9, and platelets of 264.  His sodium  was 138, potassium 3.4, chloride 107, CO2 26, BUN of 10, creatinine 0.9, and  glucose was 128.  His urine drug screen on admission revealed no detectable  illicit drugs.  His urinalysis was negative.   PHYSICAL EXAMINATION:  GENERAL:  The day of discharge, the patient was  clinically stable but requested discharge to home to finish his workup as an  outpatient.  Generally, this is a well-developed, well-nourished African-  American male in no acute distress.  HEENT:  He is normocephalic, atraumatic.  Pupils equal, round, and reactive  to light.  Extraocular muscles were intact.  Mucous membranes were moist.  NECK:  Supple with no JVD, no lymph nodes, no carotid bruits.   CHEST:  Clear to auscultation.  There were no rhonchi, rales or wheezes.  CARDIOVASCULAR:  There was regular rate and rhythm, positive S1, S2.  No S3  or S4.  No murmurs, rubs or gallops.  ABDOMEN:  Soft, nontender, nondistended, with positive bowel sounds.  EXTREMITIES:  No clubbing, cyanosis, or edema.   The patient was seen and evaluated on the day of discharge and later in the  afternoon elected to go home despite not completing his full workup for  possible gastritis.  He elected to do so with follow-up to [facility] for  outpatient testing.   CONDITION ON DISCHARGE:  Stable.   DISPOSITION:  Home.   FOLLOW-UP:  [facility].      Melissa L. Ladona Ridgel, MD  Electronically Signed     MLT/MEDQ  D:  08/29/2006  T:  08/29/2006  Job:  [id number]

## 2011-03-21 NOTE — Discharge Summary (Signed)
NAME:  Richard Lopez, CHIANG                 ACCOUNT NO.:  [id number]   MEDICAL RECORD NO.:  [id number]          PATIENT TYPE:  INP   LOCATION:  1420                         FACILITY:  [facility]   PHYSICIAN:  Corinna L. Lendell Caprice, MDDATE OF BIRTH:  [birth year]   DATE OF ADMISSION:  06/03/2006  DATE OF DISCHARGE:  06/05/2006                                 DISCHARGE SUMMARY   DISCHARGE DIAGNOSES:  1.  Acute renal failure.  2.  Probable chronic renal insufficiency.  3.  New onset type 2 diabetic.  4.  Hypertension.  5.  Alcohol abuse.  6.  Hyponatremia.  7.  Hypokalemia.  8.  Hyperlipidemia, condition stable.   DISCHARGE MEDICATIONS:  1.  Clonidine 0.1 mg p.o. b.i.d.  2.  Amaryl 2 mg p.o. daily.  3.  Zocor 20 mg nightly.   DIET:  Diabetic.   ACTIVITY:  Ad lib.   FOLLOWUP:  Follow up with [facility] first available appointment. He is  encouraged to attend AA and quit drinking. I have asked social worker to  arrange outpatient diabetic education.   PERTINENT LABORATORIES:  Urine drug screen positive for THC. Blood alcohol  less than 5. On admission his complete metabolic panel was significant for  sodium 121, potassium 3, chloride 77, bicarbonate 21, glucose 323, BUN 31,  creatinine 5.9. Total protein 10.5. Albumin 5.5. AST 65. Total bilirubin  1.7. White count on admission was 17,000, hemoglobin 18, hematocrit 54. At  discharge his white count is 9.1, hemoglobin 14, hematocrit 42. Total  cholesterol 230, triglycerides 198, HDL 47, LDL 143. UA showed large  hemoglobin, moderate bilirubin, negative ketones, negative nitrite, negative  leukocyte esterase, specific gravity 1.026, 0-2 red cells, 0-2 white cells.  Hemoglobin A1c was 8.5. At discharge his sodium is 132, potassium 3.3,  chloride 101, bicarbonate 23, glucose 128, BUN 23, creatinine 1.3. Total  bilirubin 1.7. AST 51. Amylase and lipase are normal. Renal ultrasound  showed mild echogenic renal cortex suggesting medical renal  disease, normal-  sized kidneys without stone or obstruction. EKG showed normal sinus rhythm,  prolonged QT, nonspecific changes.   HISTORY AND HOSPITAL COURSE:  Please see H&P for complete admission details.  The patient is an unassigned 35 year old black male who had been on an  alcohol binge for several weeks. He felt weak and nauseated. He was told in  the past that he had a history of hypertension and borderline diabetes,  but has no primary care physician and has not seen a physician for several  years. He had a blood pressure 167/119, pulse 102, and a fairly unremarkable  exam. Laboratories revealed hyperglycemia and renal failure as well as  hyponatremia and hypokalemia. He was admitted, started on Ativan alcohol  withdrawal protocol, given vigorous IV hydration consisting of normal saline  and potassium. His potassium was repleted. He showed no obstruction on renal  ultrasound, but it does have findings consistent with chronic medical renal  disease. His creatinine came down. His sodium and potassium improved. He was  very somnolent on the Ativan and so this was stopped. He was given thiamine.  He was not interested in rehab, but apparently is thinking about quitting  alcohol and attending Alcoholics Anonymous. He was started on Amaryl and  sliding scale insulin. He was also started on clonidine for his elevated  blood pressure, and at this point will be discharged after he gets a bit of  diabetic education. I have asked the social worker to arrange indigent  medications and follow up with [facility]. He has been there in the past.  Total time on the day of discharge is 40 minutes.      Corinna L. Lendell Caprice, MD  Electronically Signed     CLS/MEDQ  D:  06/05/2006  T:  06/05/2006  Job:  [id number]   cc:   [facility]

## 2011-03-21 NOTE — H&P (Signed)
NAME:  Richard Lopez, Richard Lopez                 ACCOUNT NO.:  [id number]   MEDICAL RECORD NO.:  [id number]          PATIENT TYPE:  INP   LOCATION:  1431                         FACILITY:  [facility]   PHYSICIAN:  Deirdre Peer. Polite, M.D. DATE OF BIRTH:  13-Jul-1976   DATE OF ADMISSION:  06/12/2006  DATE OF DISCHARGE:                                HISTORY & PHYSICAL   CHIEF COMPLAINT:  Nausea and vomiting.   HISTORY OF PRESENT ILLNESS:  A 35 year old male recently discharged from the  hospital on August 3 with above medical problems which includes acute renal  failure - however, creatinine on discharge 1.1, new onset diabetes,  hypertension, alcohol abuse, hyponatremia, hypokalemia. The patient returns  to the ED after having nausea and vomiting for approximately three to four  days, unable to keep anything down. The patient states he has been trying to  take his medicines; however, because of the nausea and vomiting, he has not  been able to keep everything down. The patient presents to the ED because of  the above complaint. In the ED, the patient was evaluated. BP 106/88. His  labs show CBC of white count of 16.5, hemoglobin 17, platelets 349; BMET  significant for hyponatremia at 125, hypokalemia at 2.9, acute renal failure  with creatinine of 5.0. After reviewing those labs, the ED felt that the  patient required admission. The patient states that he did not resume  drinking any alcohol; however, upon trying to eat when he was discharged  home, he just had abdominal cramping followed by nausea and vomiting.  Admission is deemed necessary for further evaluation and treatment.   PAST MEDICAL HISTORY:  1. Significant for newly diagnosed diabetes.  2. Hypertension.  3. History of alcohol abuse.  4. Recent hospitalization for hyponatremia/hypokalemia.  5. Hyperlipidemia.   MEDICATIONS ON ADMISSION:  Per discharge summary showed the patient is on:  1. Clonidine 0.1 mg p.o. b.i.d.  2. Amaryl 2 mg  daily.  3. Zocor 20 mg daily.   SOCIAL HISTORY:  Negative for tobacco. Denies any alcohol since discharge.  Did admit to some marijuana use.   PAST SURGICAL HISTORY:  None.   ALLERGIES:  None.   FAMILY HISTORY:  Mother with history of CVA.   PHYSICAL EXAMINATION:  GENERAL:  The patient is alert and oriented x3.  HEENT:  Pale sclerae, dry oral mucosa. No nodes. No JVD.  CHEST:  Clear.  CARDIOVASCULAR:  Regular.  ABDOMEN:  Soft, positive bowel sounds, vague generalized discomfort.  EXTREMITIES:  No edema. Pale nailbeds.  NEUROLOGICAL:  Nonfocal.   ASSESSMENT:  1. Nausea and vomiting.  2. Acute renal failure.  3. Diabetes, newly diabetes.  4. Hypertension.  5. History of ethanol abuse; the patient denies any since discharge on the      August 3.  6. Leukocytosis _____________.  7. Hyponatremia, 125.  8. Hypokalemia 2.9.   RECOMMENDATIONS:  Recommend patient be admitted to a telemetry floor bed.  The patient will be given IV fluids and will replete his potassium. Will be  given sliding scale insulin. Will obtain a urine  drug screen and will check  follow up labs. The patient had recent renal ultrasound that showed medical  renal disease during his last hospitalization. Therefore will not reorder  another one. May consider a CT of the abdomen to rule out probable  pancreatitis. However, lipase is within normal limits. The patient's  symptoms may be all secondary to ETOH-induced gastritis. Will make further  recommendations as deemed necessary.      Deirdre Peer. Polite, M.D.  Electronically Signed     RDP/MEDQ  D:  06/13/2006  T:  06/13/2006  Job:  [id number]

## 2011-06-27 ENCOUNTER — Emergency Department (HOSPITAL_COMMUNITY)
Admission: EM | Admit: 2011-06-27 | Discharge: 2011-06-28 | Disposition: A | Discharge status: Home / Self Care | Payer: Self-pay | Attending: Emergency Medicine | Admitting: Emergency Medicine

## 2011-06-27 DIAGNOSIS — Z9119 Patient's noncompliance with other medical treatment and regimen: Secondary | ICD-10-CM | POA: Insufficient documentation

## 2011-06-27 DIAGNOSIS — R5381 Other malaise: Secondary | ICD-10-CM | POA: Insufficient documentation

## 2011-06-27 DIAGNOSIS — F1020 Alcohol dependence, uncomplicated: Secondary | ICD-10-CM | POA: Insufficient documentation

## 2011-06-27 DIAGNOSIS — R109 Unspecified abdominal pain: Secondary | ICD-10-CM | POA: Insufficient documentation

## 2011-06-27 DIAGNOSIS — E119 Type 2 diabetes mellitus without complications: Secondary | ICD-10-CM | POA: Insufficient documentation

## 2011-06-27 DIAGNOSIS — R209 Unspecified disturbances of skin sensation: Secondary | ICD-10-CM | POA: Insufficient documentation

## 2011-06-27 DIAGNOSIS — Z91199 Patient's noncompliance with other medical treatment and regimen due to unspecified reason: Secondary | ICD-10-CM | POA: Insufficient documentation

## 2011-06-27 DIAGNOSIS — R112 Nausea with vomiting, unspecified: Secondary | ICD-10-CM | POA: Insufficient documentation

## 2011-06-27 DIAGNOSIS — IMO0001 Reserved for inherently not codable concepts without codable children: Secondary | ICD-10-CM | POA: Insufficient documentation

## 2011-06-27 LAB — DIFFERENTIAL
Basophils Relative: 1 % (ref 0–1)
Eosinophils Absolute: 0 10*3/uL (ref 0.0–0.7)
Monocytes Absolute: 0.7 10*3/uL (ref 0.1–1.0)
Monocytes Relative: 11 % (ref 3–12)
Neutrophils Relative %: 67 % (ref 43–77)

## 2011-06-27 LAB — URINALYSIS, ROUTINE W REFLEX MICROSCOPIC
Ketones, ur: NEGATIVE mg/dL
Leukocytes, UA: NEGATIVE
Nitrite: NEGATIVE
Protein, ur: 30 mg/dL — AB
pH: 6 (ref 5.0–8.0)

## 2011-06-27 LAB — CBC
MCH: 30.7 pg (ref 26.0–34.0)
MCHC: 34.7 g/dL (ref 30.0–36.0)
Platelets: 126 10*3/uL — ABNORMAL LOW (ref 150–400)
RDW: 14.4 % (ref 11.5–15.5)

## 2011-06-27 LAB — COMPREHENSIVE METABOLIC PANEL
ALT: 80 U/L — ABNORMAL HIGH (ref 0–53)
AST: 89 U/L — ABNORMAL HIGH (ref 0–37)
Calcium: 9.3 mg/dL (ref 8.4–10.5)
GFR calc Af Amer: >60 mL/min (ref >60)
Sodium: 133 mEq/L — ABNORMAL LOW (ref 135–145)
Total Protein: 7.8 g/dL (ref 6.0–8.3)

## 2011-06-27 LAB — GLUCOSE, CAPILLARY: Glucose-Capillary: 121 mg/dL — ABNORMAL HIGH (ref 70–99)

## 2011-08-13 LAB — CBC
HCT: 41.5
HCT: 43.8
HCT: 47.8
Hemoglobin: 15.2
MCHC: 34.4
MCHC: 34.7
MCHC: 35.1
MCV: 87.7
MCV: 88.8
Platelets: 145 — ABNORMAL LOW
Platelets: 148 — ABNORMAL LOW
Platelets: 159
RBC: 5
RDW: 12.8
RDW: 12.8
RDW: 12.9
WBC: 9

## 2011-08-13 LAB — COMPREHENSIVE METABOLIC PANEL
ALT: 95 — ABNORMAL HIGH
AST: 235 — ABNORMAL HIGH
Albumin: 3.5
Albumin: 5
Alkaline Phosphatase: 67
BUN: 14
BUN: 23
CO2: 22
Calcium: 10.2
Calcium: 8.7
Calcium: 9.2
Creatinine, Ser: 1.07
Creatinine, Ser: 1.46
GFR calc Af Amer: >60
GFR calc non Af Amer: >60
Potassium: 3.3 — ABNORMAL LOW
Sodium: 124 — ABNORMAL LOW
Total Protein: 6.5
Total Protein: 7.4
Total Protein: 8.8 — ABNORMAL HIGH

## 2011-08-13 LAB — URINE MICROSCOPIC-ADD ON

## 2011-08-13 LAB — HEMOGLOBIN A1C
Hgb A1c MFr Bld: 6.5 — ABNORMAL HIGH
Mean Plasma Glucose: 154

## 2011-08-13 LAB — DIFFERENTIAL
Lymphocytes Relative: 24
Lymphs Abs: 2.2
Monocytes Absolute: 0.9 — ABNORMAL HIGH
Monocytes Relative: 10
Neutro Abs: 6.1
Neutrophils Relative %: 66

## 2011-08-13 LAB — OSMOLALITY: Osmolality: 277

## 2011-08-13 LAB — URINALYSIS, ROUTINE W REFLEX MICROSCOPIC
Glucose, UA: NEGATIVE
Leukocytes, UA: NEGATIVE
Specific Gravity, Urine: 1.01
Urobilinogen, UA: 0.2

## 2011-08-13 LAB — COMPREHENSIVE METABOLIC PANEL WITH GFR
Albumin: 4.1
Alkaline Phosphatase: 55
BUN: 22
Chloride: 90 — ABNORMAL LOW
Creatinine, Ser: 1.35
Glucose, Bld: 180 — ABNORMAL HIGH
Potassium: 3.5
Total Bilirubin: 1.2

## 2011-08-13 LAB — SODIUM, URINE, RANDOM: Sodium, Ur: <10

## 2011-08-13 LAB — OSMOLALITY, URINE: Osmolality, Ur: 268 — ABNORMAL LOW

## 2011-08-13 LAB — LIPID PANEL
Cholesterol: 225 — ABNORMAL HIGH
HDL: 95
LDL Cholesterol: 110 — ABNORMAL HIGH
Total CHOL/HDL Ratio: 2.4
Triglycerides: 99
VLDL: 20

## 2011-08-13 LAB — TSH: TSH: 0.651

## 2011-08-13 LAB — HEPATITIS B CORE ANTIBODY, TOTAL: Hep B Core Total Ab: NEGATIVE

## 2012-02-24 ENCOUNTER — Emergency Department (HOSPITAL_COMMUNITY)
Admission: EM | Admit: 2012-02-24 | Discharge: 2012-02-24 | Disposition: A | Discharge status: Home / Self Care | Payer: Self-pay | Attending: Emergency Medicine | Admitting: Emergency Medicine

## 2012-02-24 ENCOUNTER — Emergency Department (HOSPITAL_COMMUNITY): Payer: Self-pay

## 2012-02-24 ENCOUNTER — Encounter (HOSPITAL_COMMUNITY): Payer: Self-pay | Admitting: *Deleted

## 2012-02-24 DIAGNOSIS — F17200 Nicotine dependence, unspecified, uncomplicated: Secondary | ICD-10-CM | POA: Insufficient documentation

## 2012-02-24 DIAGNOSIS — R079 Chest pain, unspecified: Secondary | ICD-10-CM | POA: Insufficient documentation

## 2012-02-24 DIAGNOSIS — R51 Headache: Secondary | ICD-10-CM | POA: Insufficient documentation

## 2012-02-24 DIAGNOSIS — G629 Polyneuropathy, unspecified: Secondary | ICD-10-CM

## 2012-02-24 DIAGNOSIS — M109 Gout, unspecified: Secondary | ICD-10-CM | POA: Insufficient documentation

## 2012-02-24 DIAGNOSIS — F10939 Alcohol use, unspecified with withdrawal, unspecified: Secondary | ICD-10-CM | POA: Insufficient documentation

## 2012-02-24 DIAGNOSIS — F10239 Alcohol dependence with withdrawal, unspecified: Secondary | ICD-10-CM | POA: Insufficient documentation

## 2012-02-24 DIAGNOSIS — F1020 Alcohol dependence, uncomplicated: Secondary | ICD-10-CM | POA: Insufficient documentation

## 2012-02-24 DIAGNOSIS — M25439 Effusion, unspecified wrist: Secondary | ICD-10-CM | POA: Insufficient documentation

## 2012-02-24 DIAGNOSIS — E119 Type 2 diabetes mellitus without complications: Secondary | ICD-10-CM | POA: Insufficient documentation

## 2012-02-24 DIAGNOSIS — M79609 Pain in unspecified limb: Secondary | ICD-10-CM | POA: Insufficient documentation

## 2012-02-24 LAB — URINALYSIS, ROUTINE W REFLEX MICROSCOPIC
Bilirubin Urine: NEGATIVE
Ketones, ur: NEGATIVE mg/dL
Nitrite: NEGATIVE
Protein, ur: NEGATIVE mg/dL
Specific Gravity, Urine: 1.004 — ABNORMAL LOW (ref 1.005–1.030)
Urobilinogen, UA: 1 mg/dL (ref 0.0–1.0)

## 2012-02-24 LAB — CBC
Hemoglobin: 12.5 g/dL — ABNORMAL LOW (ref 13.0–17.0)
MCH: 30.9 pg (ref 26.0–34.0)
MCV: 85.9 fL (ref 78.0–100.0)
RBC: 4.05 MIL/uL — ABNORMAL LOW (ref 4.22–5.81)

## 2012-02-24 LAB — DIFFERENTIAL
Eosinophils Absolute: 0.1 10*3/uL (ref 0.0–0.7)
Eosinophils Relative: 1 % (ref 0–5)
Lymphocytes Relative: 29 % (ref 12–46)
Lymphs Abs: 1.9 10*3/uL (ref 0.7–4.0)
Monocytes Relative: 12 % (ref 3–12)

## 2012-02-24 LAB — RAPID URINE DRUG SCREEN, HOSP PERFORMED
Barbiturates: NOT DETECTED
Cocaine: NOT DETECTED
Tetrahydrocannabinol: POSITIVE — AB

## 2012-02-24 LAB — MAGNESIUM: Magnesium: 1.1 mg/dL — ABNORMAL LOW (ref 1.5–2.5)

## 2012-02-24 LAB — ETHANOL: Alcohol, Ethyl (B): <11 mg/dL (ref 0–11)

## 2012-02-24 LAB — COMPREHENSIVE METABOLIC PANEL
Alkaline Phosphatase: 80 U/L (ref 39–117)
BUN: 6 mg/dL (ref 6–23)
CO2: 27 mEq/L (ref 19–32)
GFR calc Af Amer: >90 mL/min (ref >90)
GFR calc non Af Amer: >90 mL/min (ref >90)
Glucose, Bld: 132 mg/dL — ABNORMAL HIGH (ref 70–99)
Potassium: 3.5 mEq/L (ref 3.5–5.1)
Total Protein: 7.2 g/dL (ref 6.0–8.3)

## 2012-02-24 LAB — TROPONIN I: Troponin I: <0.3 ng/mL (ref <0.30)

## 2012-02-24 LAB — URIC ACID: Uric Acid, Serum: 6.9 mg/dL (ref 4.0–7.8)

## 2012-02-24 LAB — SEDIMENTATION RATE: Sed Rate: 28 mm/hr — ABNORMAL HIGH (ref 0–16)

## 2012-02-24 MED ORDER — SODIUM CHLORIDE 0.9 % IV SOLN
INTRAVENOUS | Status: DC
  Administered 2012-02-24: 05:00:00 via INTRAVENOUS

## 2012-02-24 MED ORDER — KETOROLAC TROMETHAMINE 30 MG/ML IJ SOLN
30.0000 mg | Freq: Once | INTRAMUSCULAR | Status: AC
  Administered 2012-02-24: 30 mg via INTRAVENOUS
  Filled 2012-02-24: qty 1

## 2012-02-24 MED ORDER — LORAZEPAM 2 MG/ML IJ SOLN
1.0000 mg | Freq: Once | INTRAMUSCULAR | Status: AC
  Administered 2012-02-24: 1 mg via INTRAVENOUS
  Filled 2012-02-24: qty 1

## 2012-02-24 MED ORDER — CHLORDIAZEPOXIDE HCL 25 MG PO CAPS: 25.0000 mg | ORAL_CAPSULE | Freq: Four times a day (QID) | ORAL | Status: AC | PRN

## 2012-02-24 MED ORDER — PROMETHAZINE HCL 25 MG RE SUPP: 25.0000 mg | Freq: Four times a day (QID) | RECTAL | Status: DC | PRN

## 2012-02-24 MED ORDER — MAGNESIUM SULFATE 40 MG/ML IJ SOLN
2.0000 g | INTRAMUSCULAR | Status: AC
  Administered 2012-02-24: 2 g via INTRAVENOUS
  Filled 2012-02-24: qty 50

## 2012-02-24 MED ORDER — SODIUM CHLORIDE 0.9 % IV BOLUS (SEPSIS)
1000.0000 mL | Freq: Once | INTRAVENOUS | Status: AC
  Administered 2012-02-24: 1000 mL via INTRAVENOUS

## 2012-02-24 MED ORDER — INDOMETHACIN 50 MG PO CAPS: ORAL_CAPSULE | ORAL | Status: DC

## 2012-02-24 NOTE — Discharge Instructions (Signed)
Take the medications as prescribed. Try heat and ice on your swollen joints to see which makes them feel better. Call [facility]) to get a new doctor. Recheck if you feel worse.     RESOURCE GUIDE  Psychological Services [facility]  [phone] [facility]  [phone] [facility]   [phone] (emergency services [phone])  Substance Abuse [facility]  [phone] [facility] [phone] [facility] [phone] Floydene Flock [phone] [facility]  [phone]  Alcohol Withdrawal Alcohol withdrawal happens when you normally drink alcohol a lot and suddenly stop drinking. Alcohol withdrawal symptoms can be mild to very bad. Mild withdrawal symptoms can include feeling sick to your stomach (nauseous), headache, or feeling irritable. Bad withdrawal symptoms can include shakiness, being very nervous (anxious), and not thinking clearly.  HOME CARE  Join an alcohol support group.   Stay away from people or situations that make you want to drink.   Eat a healthy diet. Eat a lot of fresh fruits, vegetables, and lean meats.  GET HELP RIGHT AWAY IF:   You become confused. You start to see and hear things that are not really there.   You feel your heart beating very fast.   You throw up (vomit) blood or cannot stop throwing up. This may be bright red or look like black coffee grounds.   You have blood in your poop (stool). This may be bright red, maroon colored, or black and tarry.   You are lightheaded or pass out (faint).   You develop a fever.  Gout Gout is caused by a buildup of uric acid crystals in the joints. The crystals make your joints sore. This is like having sand in your joints. Repeat attacks are common. Gout can be treated. HOME CARE   Do not take aspirin for pain.   Only take medicine as told by your doctor.   You may use cold treatments  (ice) on painful joints.   Put ice in a plastic bag.   Place a towel between your skin and the bag.   Leave the ice on for 15 to 20 minutes at a time, 3 to 4 times a day.   Rest in bed as much as possible. When in bed, keep the sheets and blankets off your sore joints.   Keep the sore joints raised (elevated).   Use crutches if your legs or ankles hurt.   Drink enough water and fluids to keep your pee (urine) clear or pale yellow. This helps your body get rid of uric acid. Do not drink alcohol.   Follow diet instructions as told by your doctor.   Keep your body at a healthy weight.  GET HELP RIGHT AWAY IF:   You have a temperature by mouth above 102 F (38.9 C), not controlled by medicine.   You have watery poop (diarrhea).   You are throwing up (vomiting).   You do not feel better in 1 day, or you are getting worse.   Your joint hurts more.   You have the chills.

## 2012-02-24 NOTE — ED Notes (Signed)
Pt states that he used to be a heavy drinker and that he recently quit, relapsed on Saturday and took one drink only on Sunday.  Pt states that he feels some effects of not having had alcohol.  Pt is calm and cooperated.  No distress at this time, no outward symptoms of withdrawal noted at this time

## 2012-02-24 NOTE — ED Provider Notes (Signed)
History     CSN: [id number]  Arrival date & time 02/24/12  0020   First MD Initiated Contact with Patient 02/24/12 [phone]      Chief Complaint  Patient presents with  . Leg Pain    (Consider location/radiation/quality/duration/timing/severity/associated sxs/prior treatment) HPI  Patient is here for several complaints. Patient states he is a heavy alcohol drinker. He drinks a pint to 1/5 of vodka a day +4 or 5 beers. He relates his last drink was yesterday. He relates today he has diffuse bodyaches and he has thought he was seeing things that weren't there throughout the day. He thought there was a tree limb going to hit him as he walked by and there were no trees around. His wife also states he was telling her about things on the TV screen when the TV was off. He states yesterday he felt like his skin was crawling. Patient relates he detoxed himself in October and was sober until the middle of January when he started drinking heavily again. He states he has some minor depression but he denies suicidal or homicidal ideation. Patient has refused inpatient treatment for his alcoholism or detox. Patient states he did it on his own before  Patient also complains of pain in his legs and relates his doctor has diagnosed him with neuropathy. He relates it was doing better with exercise.  Patient has a history of gout and states he's had swelling in his left hand and in his knee joints over the past several days. He denies any fever.  Patient also has a history of diabetes however he has not taken his medication on a regular basis since July. He denies polyuria, polydipsia, or nocturia.  Patient also describes chest pain for the past 4-5 days it has been constant and indicates the center of his chest he also relates his shoulders are sore. He states he had nausea and vomiting yesterday without blood. He denies any diarrhea.  Patient also states tonight he was having some sharp shooting pains in his  right temple that lasted about 20 minutes. He relates they are intermittant. This  was actually the event that prompted him to come to the ER. He states he's never had it before. He states his mother had a cranial aneurysm and he is concerned about that.  He has had nausea and vomiting without blood. He denies diarrhea.   [facility]  Past Medical History  Diagnosis Date  . Diabetes mellitus     History reviewed. No pertinent past surgical history.  No family history on file.  History  Substance Use Topics  . Smoking status: Current Everyday Smoker  . Smokeless tobacco: Not on file  . Alcohol Use: 1 pint to 1 fifth vodka a day +4-5 beers  unemployed +[employer]    Review of Systems  All other systems reviewed and are negative.    Allergies  Review of patient's allergies indicates no known allergies.  Home Medications   Current Outpatient Rx  Name Route Sig Dispense Refill  . IBUPROFEN 200 MG PO TABS Oral Take 200 mg by mouth every 6 (six) hours as needed. For pain      BP 156/96  Pulse 94  Temp(Src) 98.7 F (37.1 C) (Oral)  Resp 18  SpO2 99%  Vital signs normal    Physical Exam  Constitutional: He is oriented to person, place, and time. He appears well-developed and well-nourished.  Non-toxic appearance. He does not appear ill. No distress.  HENT:  Head: Normocephalic and atraumatic.  Right Ear: External ear normal.  Left Ear: External ear normal.  Nose: Nose normal. No mucosal edema or rhinorrhea.  Mouth/Throat: Oropharynx is clear and moist and mucous membranes are normal. No dental abscesses or uvula swelling.       Patient has tremor of his tongue  Eyes: Conjunctivae and EOM are normal. Pupils are equal, round, and reactive to light.  Neck: Normal range of motion and full passive range of motion without pain. Neck supple.  Cardiovascular: Normal rate, regular rhythm and normal heart sounds.  Exam reveals no gallop and no friction rub.   No murmur  heard. Pulmonary/Chest: Effort normal and breath sounds normal. No respiratory distress. He has no wheezes. He has no rhonchi. He has no rales. He exhibits no tenderness and no crepitus.  Abdominal: Soft. Normal appearance and bowel sounds are normal. He exhibits no distension. There is no tenderness. There is no rebound and no guarding.  Musculoskeletal: Normal range of motion. He exhibits edema and tenderness.       Patient's noted to have diffuse swelling of his left wrist, his left middle finger especially at the PIP joint also over the MCP joints. He has a mild effusion of his left knee with mild warmth. There is no redness of the skin. Patient also noted to have mild tremor in his hands  Neurological: He is alert and oriented to person, place, and time. He has normal strength. No cranial nerve deficit.  Skin: Skin is warm, dry and intact. No rash noted. No erythema. No pallor.  Psychiatric: He has a normal mood and affect. His speech is normal and behavior is normal. His mood appears not anxious.    ED Course  Procedures (including critical care time)   Medications  0.9 %  sodium chloride infusion (  Intravenous New Bag/Given 02/24/12 0432)  magnesium sulfate IVPB 2 g 50 mL (2 g Intravenous Given 02/24/12 0432)  sodium chloride 0.9 % bolus 1,000 mL (1000 mL Intravenous Given 02/24/12 0326)  LORazepam (ATIVAN) injection 1 mg (1 mg Intravenous Given 02/24/12 0328)  ketorolac (TORADOL) 30 MG/ML injection 30 mg (30 mg Intravenous Given 02/24/12 0328)   Pt relates he is feeling better, including the gout in his hand. He wants information about rehab/detox but he isn't interested in doing it at this time.   , Results for orders placed during the hospital encounter of 02/24/12  CBC      Component Value Range   WBC 6.7  4.0 - 10.5 (K/uL)   RBC 4.05 (*) 4.22 - 5.81 (MIL/uL)   Hemoglobin 12.5 (*) 13.0 - 17.0 (g/dL)   HCT 16.1 (*) 09.6 - 52.0 (%)   MCV 85.9  78.0 - 100.0 (fL)   MCH 30.9  26.0 -  34.0 (pg)   MCHC 35.9  30.0 - 36.0 (g/dL)   RDW 04.5  40.9 - 81.1 (%)   Platelets 85 (*) 150 - 400 (K/uL)  DIFFERENTIAL      Component Value Range   Neutrophils Relative 58  43 - 77 (%)   Neutro Abs 3.9  1.7 - 7.7 (K/uL)   Lymphocytes Relative 29  12 - 46 (%)   Lymphs Abs 1.9  0.7 - 4.0 (K/uL)   Monocytes Relative 12  3 - 12 (%)   Monocytes Absolute 0.8  0.1 - 1.0 (K/uL)   Eosinophils Relative 1  0 - 5 (%)   Eosinophils Absolute 0.1  0.0 - 0.7 (K/uL)  Basophils Relative 1  0 - 1 (%)   Basophils Absolute 0.0  0.0 - 0.1 (K/uL)  COMPREHENSIVE METABOLIC PANEL      Component Value Range   Sodium 128 (*) 135 - 145 (mEq/L)   Potassium 3.5  3.5 - 5.1 (mEq/L)   Chloride 89 (*) 96 - 112 (mEq/L)   CO2 27  19 - 32 (mEq/L)   Glucose, Bld 132 (*) 70 - 99 (mg/dL)   BUN 6  6 - 23 (mg/dL)   Creatinine, Ser 4.69  0.50 - 1.35 (mg/dL)   Calcium 8.7  8.4 - 62.9 (mg/dL)   Total Protein 7.2  6.0 - 8.3 (g/dL)   Albumin 3.3 (*) 3.5 - 5.2 (g/dL)   AST 51 (*) 0 - 37 (U/L)   ALT 42  0 - 53 (U/L)   Alkaline Phosphatase 80  39 - 117 (U/L)   Total Bilirubin 1.1  0.3 - 1.2 (mg/dL)   GFR calc non Af Amer >90  >90 (mL/min)   GFR calc Af Amer >90  >90 (mL/min)  ETHANOL      Component Value Range   Alcohol, Ethyl (B) <11  0 - 11 (mg/dL)  URINALYSIS, ROUTINE W REFLEX MICROSCOPIC      Component Value Range   Color, Urine STRAW (*) YELLOW    APPearance CLOUDY (*) CLEAR    Specific Gravity, Urine 1.004 (*) 1.005 - 1.030    pH 6.5  5.0 - 8.0    Glucose, UA NEGATIVE  NEGATIVE (mg/dL)   Hgb urine dipstick TRACE (*) NEGATIVE    Bilirubin Urine NEGATIVE  NEGATIVE    Ketones, ur NEGATIVE  NEGATIVE (mg/dL)   Protein, ur NEGATIVE  NEGATIVE (mg/dL)   Urobilinogen, UA 1.0  0.0 - 1.0 (mg/dL)   Nitrite NEGATIVE  NEGATIVE    Leukocytes, UA TRACE (*) NEGATIVE   URINE RAPID DRUG SCREEN (HOSP PERFORMED)      Component Value Range   Opiates NONE DETECTED  NONE DETECTED    Cocaine NONE DETECTED  NONE DETECTED     Benzodiazepines NONE DETECTED  NONE DETECTED    Amphetamines NONE DETECTED  NONE DETECTED    Tetrahydrocannabinol POSITIVE (*) NONE DETECTED    Barbiturates NONE DETECTED  NONE DETECTED   MAGNESIUM      Component Value Range   Magnesium 1.1 (*) 1.5 - 2.5 (mg/dL)  TROPONIN I      Component Value Range   Troponin I <0.30  <0.30 (ng/mL)  SEDIMENTATION RATE      Component Value Range   Sed Rate 28 (*) 0 - 16 (mm/hr)  URIC ACID      Component Value Range   Uric Acid, Serum 6.9  4.0 - 7.8 (mg/dL)  URINE MICROSCOPIC-ADD ON      Component Value Range   Squamous Epithelial / LPF FEW (*) RARE    WBC, UA 3-6  <3 (WBC/hpf)   RBC / HPF 0-2  <3 (RBC/hpf)   Laboratory interpretation all normal except low magnesium, low sodium and chloride c/w dehydration, mild anemia   Ct Head Wo Contrast  02/24/2012  *RADIOLOGY REPORT*  Clinical Data: Headache.  Alcohol withdrawal.  Family history of aneurysm.  CT HEAD WITHOUT CONTRAST  Technique:  Contiguous axial images were obtained from the base of the skull through the vertex without contrast.  Comparison: None.  Findings: Motion artifact limits visualization of the skull base. Mild cerebral atrophy.  Ventricles are not dilated.  No mass effect or midline shift.  No abnormal extra-axial fluid collections.  Wallace Cullens- white matter junctions are distinct.  Basal cisterns are not effaced.  No evidence of acute intracranial hemorrhage.  The visualized paranasal sinuses and mastoid air cells are not opacified.  No depressed skull fractures.  IMPRESSION: No acute intracranial abnormalities.  Original Report Authenticated By: Marlon Pel, M.D.       Date: 02/24/2012  Rate: 96  Rhythm: normal sinus rhythm and premature ventricular contractions (PVC)  QRS Axis: normal  Intervals: normal  ST/T Wave abnormalities: normal  Conduction Disutrbances:none  Narrative Interpretation: PRWP  Old EKG Reviewed: unchanged from 06/04/2006    1. Alcohol withdrawal   2.  Gout attack   3. Peripheral neuropathy   4. Headache   5. Hypomagnesemia     New Prescriptions   CHLORDIAZEPOXIDE (LIBRIUM) 25 MG CAPSULE    Take 1 capsule (25 mg total) by mouth 4 (four) times daily as needed for anxiety.   INDOMETHACIN (INDOCIN) 50 MG CAPSULE    Take 1 po QID x 2d then 1 poTID x3d then 1 po BID x3d then 1 po QD x 3d   PROMETHAZINE (PHENERGAN) 25 MG SUPPOSITORY    Place 1 suppository (25 mg total) rectally every 6 (six) hours as needed for nausea.   Plan discharge  Devoria Albe, MD, FACEP    MDM          Ward Givens, MD 02/24/12 [phone]

## 2012-02-24 NOTE — ED Notes (Signed)
The pt says he has had some lower leg pain for several weeks,  He has been walking that increases the pain.  He has also had some head pain for several days

## 2012-04-07 ENCOUNTER — Emergency Department (HOSPITAL_COMMUNITY)
Admission: EM | Admit: 2012-04-07 | Discharge: 2012-04-08 | Disposition: A | Discharge status: Home / Self Care | Payer: Self-pay | Attending: Emergency Medicine | Admitting: Emergency Medicine

## 2012-04-07 ENCOUNTER — Encounter (HOSPITAL_COMMUNITY): Payer: Self-pay | Admitting: Family Medicine

## 2012-04-07 DIAGNOSIS — F1020 Alcohol dependence, uncomplicated: Secondary | ICD-10-CM

## 2012-04-07 DIAGNOSIS — R42 Dizziness and giddiness: Secondary | ICD-10-CM | POA: Insufficient documentation

## 2012-04-07 DIAGNOSIS — F17200 Nicotine dependence, unspecified, uncomplicated: Secondary | ICD-10-CM | POA: Insufficient documentation

## 2012-04-07 DIAGNOSIS — E119 Type 2 diabetes mellitus without complications: Secondary | ICD-10-CM | POA: Insufficient documentation

## 2012-04-07 MED ORDER — SODIUM CHLORIDE 0.9 % IV BOLUS (SEPSIS)
1000.0000 mL | Freq: Once | INTRAVENOUS | Status: AC
  Administered 2012-04-07: 1000 mL via INTRAVENOUS

## 2012-04-07 MED ORDER — LORAZEPAM 1 MG PO TABS
1.0000 mg | ORAL_TABLET | Freq: Once | ORAL | Status: AC
  Administered 2012-04-07: 1 mg via ORAL
  Filled 2012-04-07: qty 1

## 2012-04-07 NOTE — ED Provider Notes (Signed)
History     CSN: [id number]  Arrival date & time 04/07/12  1958   First MD Initiated Contact with Patient 04/07/12 2214      Chief Complaint  Patient presents with  . Dizziness    (Consider location/radiation/quality/duration/timing/severity/associated sxs/prior treatment) HPI Comments: Patient with history of alcohol abuse comes in today with a chief complaint of feeling lightheaded.  He reports that earlier today he felt unsteady, but is not feeling unsteady at this time.  He also has been having fine tremors of hands bilaterally.  He reports that he drinks approximately 12 beers daily.  His last beer was approximately 24 hours ago.  He reports that he has gone through alcohol withdrawal in the past.  No history of DT.  He denies any hallucinations at this time.  He reports that he had some vomiting two days ago, which he feels is secondary to drinking too much alcohol Sunday evening.  He has had some diarrhea today.  He reports that he is feeling dehydrated.  Patient reports that he is not interested in alcohol detox at this time.    The history is provided by the patient.    Past Medical History  Diagnosis Date  . Diabetes mellitus     History reviewed. No pertinent past surgical history.  No family history on file.  History  Substance Use Topics  . Smoking status: Current Everyday Smoker -- 0.5 packs/day    Types: Cigarettes  . Smokeless tobacco: Not on file  . Alcohol Use: Yes     half gallon liquor      Review of Systems  Constitutional: Negative for fever and chills.  Respiratory: Negative for shortness of breath.   Cardiovascular: Negative for chest pain.  Gastrointestinal: Positive for diarrhea. Negative for nausea, vomiting and abdominal pain.  Neurological: Positive for tremors and light-headedness. Negative for syncope, weakness, numbness and headaches.  Psychiatric/Behavioral: Negative for confusion.    Allergies  Review of patient's allergies indicates  no known allergies.  Home Medications   Current Outpatient Rx  Name Route Sig Dispense Refill  . INDOMETHACIN 50 MG PO CAPS  Take 1 po QID x 2d then 1 poTID x3d then 1 po BID x3d then 1 po QD x 3d 27 capsule 0  . POTASSIUM 75 MG PO TABS Oral Take 1 tablet by mouth daily.    [name] PROMETHAZINE HCL 25 MG RE SUPP Rectal Place 1 suppository (25 mg total) rectally every 6 (six) hours as needed for nausea. 6 each 0    BP 161/94  Pulse 92  Temp(Src) 98.2 F (36.8 C) (Oral)  Resp 18  SpO2 100%  Physical Exam  Nursing note and vitals reviewed. Constitutional: He appears well-developed and well-nourished. No distress.  HENT:  Head: Normocephalic and atraumatic.  Mouth/Throat: Oropharynx is clear and moist.  Eyes: EOM are normal. Pupils are equal, round, and reactive to light.  Neck: Normal range of motion. Neck supple.  Cardiovascular: Normal rate, regular rhythm and normal heart sounds.   Pulmonary/Chest: Effort normal and breath sounds normal.  Neurological: He is alert. He has normal strength. He displays tremor. No cranial nerve deficit or sensory deficit. Gait normal.       Fine tremor of the hand bilaterally  Skin: Skin is warm and dry. He is not diaphoretic.  Psychiatric: He has a normal mood and affect.    ED Course  Procedures (including critical care time)  Labs Reviewed  GLUCOSE, CAPILLARY - Abnormal; Notable for the following:  Glucose-Capillary 118 (*)    All other components within normal limits  GLUCOSE, CAPILLARY - Abnormal; Notable for the following:    Glucose-Capillary 111 (*)    All other components within normal limits  BASIC METABOLIC PANEL  CBC  DIFFERENTIAL   No results found.   No diagnosis found.    MDM  Patient with history of alcohol abuse comes in today with fine tremors of hands bilaterally and dizziness.  Patient given ativan and symptoms improved.  Patient asymptomatic prior to discharge.  Suspect alcohol withdrawal.  Patient not interested  in detox.  Patient has a Librium prescription at home from his last visit in the ED.         Richard Lux Kibler, PA-C 04/11/12 2139

## 2012-04-07 NOTE — ED Notes (Signed)
CBG 111 

## 2012-04-07 NOTE — ED Notes (Signed)
Patient states that this afternoon around 4pm he started feeling shaky and felt like he could control his legs from the knees down. States he feels dehydrated and that he has gastritis and was vomiting on Monday.

## 2012-04-07 NOTE — ED Notes (Signed)
[id number]<AV> Expected date:<BR> Expected time:<BR> Means of arrival:<BR> Comments:<BR> Hold per charge

## 2012-04-08 LAB — DIFFERENTIAL
Basophils Absolute: 0.1 10*3/uL (ref 0.0–0.1)
Eosinophils Absolute: 0.1 10*3/uL (ref 0.0–0.7)
Lymphs Abs: 1.6 10*3/uL (ref 0.7–4.0)
Monocytes Absolute: 0.9 10*3/uL (ref 0.1–1.0)
Neutro Abs: 3.6 10*3/uL (ref 1.7–7.7)

## 2012-04-08 LAB — CBC
HCT: 38 % — ABNORMAL LOW (ref 39.0–52.0)
MCHC: 34.7 g/dL (ref 30.0–36.0)
MCV: 88.4 fL (ref 78.0–100.0)
Platelets: 111 10*3/uL — ABNORMAL LOW (ref 150–400)
RDW: 14.4 % (ref 11.5–15.5)

## 2012-04-08 LAB — BASIC METABOLIC PANEL
BUN: 6 mg/dL (ref 6–23)
CO2: 28 mEq/L (ref 19–32)
Calcium: 8.6 mg/dL (ref 8.4–10.5)
Chloride: 95 mEq/L — ABNORMAL LOW (ref 96–112)
Creatinine, Ser: 0.76 mg/dL (ref 0.50–1.35)

## 2012-04-08 NOTE — Discharge Instructions (Signed)
Take the Librium that you were given during your last hospitalization.   Alcohol Withdrawal Alcohol withdrawal happens when you normally drink alcohol a lot and suddenly stop drinking. Alcohol withdrawal symptoms can be mild to very bad. Mild withdrawal symptoms can include feeling sick to your stomach (nauseous), headache, or feeling irritable. Bad withdrawal symptoms can include shakiness, being very nervous (anxious), and not thinking clearly.  HOME CARE  Join an alcohol support group.   Stay away from people or situations that make you want to drink.   Eat a healthy diet. Eat a lot of fresh fruits, vegetables, and lean meats.  GET HELP RIGHT AWAY IF:   You become confused. You start to see and hear things that are not really there.   You feel your heart beating very fast.   You throw up (vomit) blood or cannot stop throwing up. This may be bright red or look like black coffee grounds.   You have blood in your poop (stool). This may be bright red, maroon colored, or black and tarry.   You are lightheaded or pass out (faint).   You develop a fever.  MAKE SURE YOU:   Understand these instructions.   Will watch your condition.   Will get help right away if you are not doing well or get worse.  Document Released: 04/07/2008 Document Revised: 10/09/2011 Document Reviewed: 04/07/2008 [facility] Patient Information 2012 [place], [place].Alcohol Withdrawal Alcohol withdrawal happens when you normally drink alcohol a lot and suddenly stop drinking. Alcohol withdrawal symptoms can be mild to very bad. Mild withdrawal symptoms can include feeling sick to your stomach (nauseous), headache, or feeling irritable. Bad withdrawal symptoms can include shakiness, being very nervous (anxious), and not thinking clearly.  HOME CARE  Join an alcohol support group.   Stay away from people or situations that make you want to drink.   Eat a healthy diet. Eat a lot of fresh fruits, vegetables, and lean  meats.  GET HELP RIGHT AWAY IF:   You become confused. You start to see and hear things that are not really there.   You feel your heart beating very fast.   You throw up (vomit) blood or cannot stop throwing up. This may be bright red or look like black coffee grounds.   You have blood in your poop (stool). This may be bright red, maroon colored, or black and tarry.   You are lightheaded or pass out (faint).   You develop a fever.  MAKE SURE YOU:   Understand these instructions.   Will watch your condition.   Will get help right away if you are not doing well or get worse.  Document Released: 04/07/2008 Document Revised: 10/09/2011 Document Reviewed: 04/07/2008 [facility] Patient Information 2012 [place], [place].

## 2012-04-13 NOTE — ED Provider Notes (Signed)
Medical screening examination/treatment/procedure(s) were performed by non-physician practitioner and as supervising physician I was immediately available for consultation/collaboration.   Henrick Mcgue, MD 04/13/12 1629 

## 2012-04-27 ENCOUNTER — Emergency Department (HOSPITAL_COMMUNITY)
Admission: EM | Admit: 2012-04-27 | Discharge: 2012-04-27 | Disposition: A | Discharge status: Home / Self Care | Payer: Self-pay | Attending: Emergency Medicine | Admitting: Emergency Medicine

## 2012-04-27 ENCOUNTER — Emergency Department (HOSPITAL_COMMUNITY): Payer: Self-pay

## 2012-04-27 ENCOUNTER — Encounter (HOSPITAL_COMMUNITY): Payer: Self-pay | Admitting: *Deleted

## 2012-04-27 DIAGNOSIS — Z79899 Other long term (current) drug therapy: Secondary | ICD-10-CM | POA: Insufficient documentation

## 2012-04-27 DIAGNOSIS — M25459 Effusion, unspecified hip: Secondary | ICD-10-CM | POA: Insufficient documentation

## 2012-04-27 DIAGNOSIS — E119 Type 2 diabetes mellitus without complications: Secondary | ICD-10-CM | POA: Insufficient documentation

## 2012-04-27 DIAGNOSIS — F17200 Nicotine dependence, unspecified, uncomplicated: Secondary | ICD-10-CM | POA: Insufficient documentation

## 2012-04-27 DIAGNOSIS — M25552 Pain in left hip: Secondary | ICD-10-CM

## 2012-04-27 DIAGNOSIS — M25559 Pain in unspecified hip: Secondary | ICD-10-CM | POA: Insufficient documentation

## 2012-04-27 DIAGNOSIS — R937 Abnormal findings on diagnostic imaging of other parts of musculoskeletal system: Secondary | ICD-10-CM | POA: Insufficient documentation

## 2012-04-27 DIAGNOSIS — R109 Unspecified abdominal pain: Secondary | ICD-10-CM | POA: Insufficient documentation

## 2012-04-27 HISTORY — DX: Gastritis, unspecified, without bleeding: K29.70

## 2012-04-27 LAB — URINE MICROSCOPIC-ADD ON

## 2012-04-27 LAB — BASIC METABOLIC PANEL
CO2: 25 mEq/L (ref 19–32)
Calcium: 8.6 mg/dL (ref 8.4–10.5)
Potassium: 3.6 mEq/L (ref 3.5–5.1)
Sodium: 134 mEq/L — ABNORMAL LOW (ref 135–145)

## 2012-04-27 LAB — URINALYSIS, ROUTINE W REFLEX MICROSCOPIC
Glucose, UA: NEGATIVE mg/dL
Hgb urine dipstick: NEGATIVE
Specific Gravity, Urine: 1.021 (ref 1.005–1.030)
Urobilinogen, UA: 1 mg/dL (ref 0.0–1.0)
pH: 8 (ref 5.0–8.0)

## 2012-04-27 LAB — CBC
MCH: 31 pg (ref 26.0–34.0)
Platelets: 193 10*3/uL (ref 150–400)
RBC: 4.55 MIL/uL (ref 4.22–5.81)
WBC: 8.1 10*3/uL (ref 4.0–10.5)

## 2012-04-27 LAB — SEDIMENTATION RATE: Sed Rate: 10 mm/hr (ref 0–16)

## 2012-04-27 MED ORDER — HYDROMORPHONE HCL PF 2 MG/ML IJ SOLN
2.0000 mg | Freq: Once | INTRAMUSCULAR | Status: AC
  Administered 2012-04-27: 2 mg via INTRAMUSCULAR
  Filled 2012-04-27: qty 1

## 2012-04-27 MED ORDER — OXYCODONE-ACETAMINOPHEN 5-325 MG PO TABS: 2.0000 | ORAL_TABLET | ORAL | Status: AC | PRN

## 2012-04-27 MED ORDER — HYDROMORPHONE HCL PF 1 MG/ML IJ SOLN
1.0000 mg | Freq: Once | INTRAMUSCULAR | Status: AC
  Administered 2012-04-27: 1 mg via INTRAVENOUS
  Filled 2012-04-27: qty 1

## 2012-04-27 MED ORDER — SODIUM CHLORIDE 0.9 % IV SOLN
Freq: Once | INTRAVENOUS | Status: AC
  Administered 2012-04-27: 19:00:00 via INTRAVENOUS

## 2012-04-27 MED ORDER — ONDANSETRON 8 MG PO TBDP
8.0000 mg | ORAL_TABLET | Freq: Once | ORAL | Status: AC
  Administered 2012-04-27: 8 mg via ORAL
  Filled 2012-04-27: qty 1

## 2012-04-27 NOTE — ED Provider Notes (Signed)
History     CSN: [id number] Arrival date & time 04/27/12  1137 First MD Initiated Contact with Patient 04/27/12 1227      Chief Complaint  Patient presents with  . Groin Injury    left  . Groin Pain    left   HPI The patient presents emergent complaints of left-sided groin pain.  Patient was lifting weights and working out on Saturday. Following that he began having pain in his left groin area. Patient states last night the pain has become more severe. He has been trying to use a cane and take nonsteroidal anti-inflammatory agents without any relief. Patient states he cannot bear weight on his left leg without significant pain. Trying to lift his leg as well also causes significant pain. The pain is in the left hip going down into his left medial thigh region. The pain is sharp and severe. He has not noticed any difficulty with urination although it did look dark today. He denies any abdominal pain or back pain. He denies any numbness or weakness.   Past Medical History  Diagnosis Date  . Diabetes mellitus   . Gastritis     History reviewed. No pertinent past surgical history.  History reviewed. No pertinent family history.  History  Substance Use Topics  . Smoking status: Current Everyday Smoker -- 0.5 packs/day    Types: Cigarettes  . Smokeless tobacco: Never Used  . Alcohol Use: Yes     half gallon liquor      Review of Systems  All other systems reviewed and are negative.    Allergies  Review of patient's allergies indicates no known allergies.  Home Medications   Current Outpatient Rx  Name Route Sig Dispense Refill  . INDOMETHACIN 50 MG PO CAPS  Take 1 po QID x 2d then 1 poTID x3d then 1 po BID x3d then 1 po QD x 3d 27 capsule 0  . POTASSIUM 75 MG PO TABS Oral Take 1 tablet by mouth daily.    [name] PROMETHAZINE HCL 25 MG RE SUPP Rectal Place 1 suppository (25 mg total) rectally every 6 (six) hours as needed for nausea. 6 each 0    BP 158/102  Pulse 99  Temp  98.7 F (37.1 C) (Oral)  Resp 18  Wt 246 lb (111.585 kg)  SpO2 98%  Physical Exam  Nursing note and vitals reviewed. Constitutional: He appears well-developed and well-nourished. No distress.  HENT:  Head: Normocephalic and atraumatic.  Right Ear: External ear normal.  Left Ear: External ear normal.  Eyes: Conjunctivae are normal. Right eye exhibits no discharge. Left eye exhibits no discharge. No scleral icterus.  Neck: Neck supple. No tracheal deviation present.  Cardiovascular: Normal rate.   Pulmonary/Chest: Effort normal. No stridor. No respiratory distress.  Abdominal: He exhibits no distension. There is no tenderness. There is no rebound. Hernia confirmed negative in the right inguinal area and confirmed negative in the left inguinal area.  Genitourinary: Penis normal. Left testis shows no mass, no swelling and no tenderness.  Musculoskeletal: He exhibits no edema.       Left upper leg: He exhibits tenderness. He exhibits no swelling, no edema and no deformity.       Strong dorsalis pedis pulse bilaterally, tenderness left medial thigh, no erythema, edema or  Neurological: He is alert. Cranial nerve deficit: no gross deficits.  Skin: Skin is warm and dry. No rash noted.  Psychiatric: He has a normal mood and affect.    ED  Course  Procedures (including critical care time)  Labs Reviewed  URINALYSIS, ROUTINE W REFLEX MICROSCOPIC - Abnormal; Notable for the following:    Color, Urine AMBER (*)  BIOCHEMICALS MAY BE AFFECTED BY COLOR   Bilirubin Urine SMALL (*)     Ketones, ur TRACE (*)     Protein, ur 100 (*)     Leukocytes, UA SMALL (*)     All other components within normal limits  URINE MICROSCOPIC-ADD ON - Abnormal; Notable for the following:    Squamous Epithelial / LPF FEW (*)     All other components within normal limits   Dg Hip Complete Left  04/27/2012  *RADIOLOGY REPORT*  Clinical Data: Left groin pain.  LEFT HIP - COMPLETE 2+ VIEW  Comparison: CT abdomen and  pelvis dated 05/03/2010  Findings: There is an abnormal appearance of the medullary cavity of the proximal left femoral shaft.  There is endosteal scalloping of the cortex with a permeative appearance of the medullary cavity. I am concerned this could represent an infiltrative process such as lymphoma or Paget's disease could also give this appearance.  MRI of the left femur with and without contrast is recommended.  The left hip joint is normal.  There are benign some synovial herniation pits in the superior aspect the left femoral neck, unchanged since the prior CT scan.  IMPRESSION: Abnormal appearance of the medullary cavity of the proximal left femoral shaft.  MRI of the left femur with without contrast is recommended for further evaluation.  The differential diagnosis includes Paget's disease and lymphoma.  Original Report Authenticated By: Gwynn Burly, M.D.     MDM  Pt with abnormality noted on the xray.  Will proceed with MRI to evaluate further.  Pt still having pain.  Additional IV dilaudid ordered.       Celene Kras, MD 04/27/12 [phone]

## 2012-04-27 NOTE — ED Notes (Signed)
Patient not in room.will obtain blood upon patient's return

## 2012-04-27 NOTE — Discharge Instructions (Signed)
Arthralgia Get your blood pressure rechecked within the next 3 weeks Arthralgia is joint pain. A joint is a place where two bones meet. Joint pain can happen for many reasons. The joint can be bruised, stiff, infected, or weak from aging. Pain usually goes away after resting and taking medicine for soreness.  HOME CARE  Rest the joint as told by your doctor.   Keep the sore joint raised (elevated) for the first 24 hours.   Put ice on the joint area.   Put ice in a plastic bag.   Place a towel between your skin and the bag.   Leave the ice on for 15 to 20 minutes, 3 to 4 times a day.   Wear your splint, casting, elastic bandage, or sling as told by your doctor.   Only take medicine as told by your doctor. Do not take aspirin.   Use crutches as told by your doctor. Do not put weight on the joint until told to by your doctor.  GET HELP RIGHT AWAY IF:   You have bruising, puffiness (swelling), or more pain.   Your fingers or toes turn blue or start to lose feeling (numb).   Your medicine does not lessen the pain.   Your pain becomes severe.   You have a temperature by mouth above 102 F (38.9 C), not controlled by medicine.   You cannot move or use the joint.  MAKE SURE YOU:   Understand these instructions.   Will watch your condition.   Will get help right away if you are not doing well or get worse.  Document Released: 10/08/2009 Document Revised: 10/09/2011 Document Reviewed: 10/08/2009 [facility] Patient Information 2012 [place], [place].

## 2012-04-27 NOTE — ED Notes (Signed)
Pt. Is aware of giving a urine specimen. Pt. Unable to urinate at this time. Pt. Has a urinal at the bedside.

## 2012-04-27 NOTE — ED Notes (Signed)
md alerted of pts request to know results for MRI.

## 2012-04-27 NOTE — ED Notes (Signed)
Pt back from MRI  Pt alert and oriented x4. Respirations even and unlabored, bilateral symmetrical rise and fall of chest. Skin warm and dry. In no acute distress. Denies needs.   

## 2012-04-27 NOTE — ED Provider Notes (Addendum)
Complains of left-sided groin pain onset 2 days ago after exercising and lifting weights 3 days ago pain is worse with movement or or walking Case discussed with Dr.Landau in light of normal sedimentation rate and no leukocytosis or fever septic joint is highly unlikely Plan prescription Percocet,, crutches Patient to see Dr. Dion Saucier in office as outpatient. Diagnosis left hip pain,  Elevated blood pressure  Doug Sou, MD 04/27/12 8295  Doug Sou, MD 04/28/12 [phone]

## 2012-04-27 NOTE — ED Notes (Signed)
Pt still in MRI 

## 2012-04-27 NOTE — ED Notes (Signed)
Pt from home with reports of left groin injury while working out Saturday with worsening pain since. Pt endorses area feeling tight over the last few weeks after working out but is now unable to bear weight or sleep. Pt denies N/V/D or fever.

## 2014-07-29 ENCOUNTER — Inpatient Hospital Stay (HOSPITAL_COMMUNITY)
Admission: EM | Admit: 2014-07-29 | Discharge: 2014-07-31 | DRG: 683 | Disposition: A | Discharge status: Home / Self Care | Payer: Self-pay | Attending: Internal Medicine | Admitting: Internal Medicine

## 2014-07-29 ENCOUNTER — Encounter (HOSPITAL_COMMUNITY): Payer: Self-pay | Admitting: Emergency Medicine

## 2014-07-29 DIAGNOSIS — F1210 Cannabis abuse, uncomplicated: Secondary | ICD-10-CM | POA: Diagnosis present

## 2014-07-29 DIAGNOSIS — E1169 Type 2 diabetes mellitus with other specified complication: Secondary | ICD-10-CM

## 2014-07-29 DIAGNOSIS — I10 Essential (primary) hypertension: Secondary | ICD-10-CM | POA: Diagnosis present

## 2014-07-29 DIAGNOSIS — R739 Hyperglycemia, unspecified: Secondary | ICD-10-CM

## 2014-07-29 DIAGNOSIS — R7309 Other abnormal glucose: Secondary | ICD-10-CM

## 2014-07-29 DIAGNOSIS — E119 Type 2 diabetes mellitus without complications: Secondary | ICD-10-CM | POA: Diagnosis present

## 2014-07-29 DIAGNOSIS — E871 Hypo-osmolality and hyponatremia: Secondary | ICD-10-CM | POA: Diagnosis present

## 2014-07-29 DIAGNOSIS — R030 Elevated blood-pressure reading, without diagnosis of hypertension: Secondary | ICD-10-CM

## 2014-07-29 DIAGNOSIS — R1116 Cannabis hyperemesis syndrome: Secondary | ICD-10-CM | POA: Diagnosis present

## 2014-07-29 DIAGNOSIS — R1115 Cyclical vomiting syndrome unrelated to migraine: Secondary | ICD-10-CM | POA: Diagnosis present

## 2014-07-29 DIAGNOSIS — F1290 Cannabis use, unspecified, uncomplicated: Secondary | ICD-10-CM | POA: Diagnosis present

## 2014-07-29 DIAGNOSIS — N179 Acute kidney failure, unspecified: Principal | ICD-10-CM | POA: Diagnosis present

## 2014-07-29 DIAGNOSIS — M109 Gout, unspecified: Secondary | ICD-10-CM | POA: Diagnosis present

## 2014-07-29 DIAGNOSIS — A09 Infectious gastroenteritis and colitis, unspecified: Secondary | ICD-10-CM | POA: Diagnosis present

## 2014-07-29 DIAGNOSIS — M10062 Idiopathic gout, left knee: Secondary | ICD-10-CM

## 2014-07-29 DIAGNOSIS — F17200 Nicotine dependence, unspecified, uncomplicated: Secondary | ICD-10-CM | POA: Diagnosis present

## 2014-07-29 DIAGNOSIS — F1010 Alcohol abuse, uncomplicated: Secondary | ICD-10-CM | POA: Diagnosis present

## 2014-07-29 DIAGNOSIS — IMO0001 Reserved for inherently not codable concepts without codable children: Secondary | ICD-10-CM

## 2014-07-29 DIAGNOSIS — E876 Hypokalemia: Secondary | ICD-10-CM | POA: Diagnosis present

## 2014-07-29 DIAGNOSIS — E669 Obesity, unspecified: Secondary | ICD-10-CM

## 2014-07-29 DIAGNOSIS — E785 Hyperlipidemia, unspecified: Secondary | ICD-10-CM

## 2014-07-29 DIAGNOSIS — R112 Nausea with vomiting, unspecified: Secondary | ICD-10-CM | POA: Diagnosis present

## 2014-07-29 HISTORY — DX: Cannabis hyperemesis syndrome: R11.16

## 2014-07-29 LAB — COMPREHENSIVE METABOLIC PANEL
ALT: 18 U/L (ref 0–53)
AST: 30 U/L (ref 0–37)
Albumin: 4 g/dL (ref 3.5–5.2)
Alkaline Phosphatase: 91 U/L (ref 39–117)
Anion gap: 26 — ABNORMAL HIGH (ref 5–15)
BUN: 32 mg/dL — ABNORMAL HIGH (ref 6–23)
CO2: 27 mEq/L (ref 19–32)
Calcium: 9.4 mg/dL (ref 8.4–10.5)
Chloride: 75 mEq/L — ABNORMAL LOW (ref 96–112)
Creatinine, Ser: 2.72 mg/dL — ABNORMAL HIGH (ref 0.50–1.35)
GFR calc Af Amer: 33 mL/min — ABNORMAL LOW (ref >90)
GFR calc non Af Amer: 28 mL/min — ABNORMAL LOW (ref >90)
Glucose, Bld: 240 mg/dL — ABNORMAL HIGH (ref 70–99)
Potassium: 3.7 mEq/L (ref 3.7–5.3)
Sodium: 128 mEq/L — ABNORMAL LOW (ref 137–147)
Total Bilirubin: 0.8 mg/dL (ref 0.3–1.2)
Total Protein: 9.7 g/dL — ABNORMAL HIGH (ref 6.0–8.3)

## 2014-07-29 LAB — RAPID URINE DRUG SCREEN, HOSP PERFORMED
AMPHETAMINES: NOT DETECTED
BENZODIAZEPINES: NOT DETECTED
Barbiturates: NOT DETECTED
COCAINE: NOT DETECTED
Opiates: NOT DETECTED
Tetrahydrocannabinol: POSITIVE — AB

## 2014-07-29 LAB — URINALYSIS, ROUTINE W REFLEX MICROSCOPIC
Glucose, UA: NEGATIVE mg/dL
Ketones, ur: NEGATIVE mg/dL
Leukocytes, UA: NEGATIVE
Nitrite: NEGATIVE
Protein, ur: 100 mg/dL — AB
Specific Gravity, Urine: 1.022 (ref 1.005–1.030)
Urobilinogen, UA: 1 mg/dL (ref 0.0–1.0)
pH: 5 (ref 5.0–8.0)

## 2014-07-29 LAB — CBC WITH DIFFERENTIAL/PLATELET
Basophils Absolute: 0 10*3/uL (ref 0.0–0.1)
Basophils Relative: 0 % (ref 0–1)
Eosinophils Absolute: 0 10*3/uL (ref 0.0–0.7)
Eosinophils Relative: 0 % (ref 0–5)
HCT: 46.2 % (ref 39.0–52.0)
Hemoglobin: 16.4 g/dL (ref 13.0–17.0)
Lymphocytes Relative: 13 % (ref 12–46)
Lymphs Abs: 1.2 10*3/uL (ref 0.7–4.0)
MCH: 31.5 pg (ref 26.0–34.0)
MCHC: 35.5 g/dL (ref 30.0–36.0)
MCV: 88.7 fL (ref 78.0–100.0)
Monocytes Absolute: 0.9 10*3/uL (ref 0.1–1.0)
Monocytes Relative: 9 % (ref 3–12)
Neutro Abs: 7.5 10*3/uL (ref 1.7–7.7)
Neutrophils Relative %: 78 % — ABNORMAL HIGH (ref 43–77)
Platelets: 140 10*3/uL — ABNORMAL LOW (ref 150–400)
RBC: 5.21 MIL/uL (ref 4.22–5.81)
RDW: 14.8 % (ref 11.5–15.5)
WBC: 9.6 10*3/uL (ref 4.0–10.5)

## 2014-07-29 LAB — GLUCOSE, CAPILLARY: Glucose-Capillary: 154 mg/dL — ABNORMAL HIGH (ref 70–99)

## 2014-07-29 LAB — URINE MICROSCOPIC-ADD ON

## 2014-07-29 LAB — CK: Total CK: 629 U/L — ABNORMAL HIGH (ref 7–232)

## 2014-07-29 LAB — URIC ACID: Uric Acid, Serum: 15.6 mg/dL — ABNORMAL HIGH (ref 4.0–7.8)

## 2014-07-29 LAB — TROPONIN I: Troponin I: <0.3 ng/mL (ref <0.30)

## 2014-07-29 LAB — LIPASE, BLOOD: LIPASE: 28 U/L (ref 11–59)

## 2014-07-29 MED ORDER — ONDANSETRON HCL 4 MG/2ML IJ SOLN
4.0000 mg | Freq: Once | INTRAMUSCULAR | Status: AC
  Administered 2014-07-29: 4 mg via INTRAVENOUS
  Filled 2014-07-29: qty 2

## 2014-07-29 MED ORDER — VITAMIN B-1 100 MG PO TABS
100.0000 mg | ORAL_TABLET | Freq: Every day | ORAL | Status: DC
  Administered 2014-07-29 – 2014-07-31 (×3): 100 mg via ORAL
  Filled 2014-07-29 (×3): qty 1

## 2014-07-29 MED ORDER — INSULIN ASPART 100 UNIT/ML ~~LOC~~ SOLN
0.0000 [IU] | Freq: Three times a day (TID) | SUBCUTANEOUS | Status: DC
  Administered 2014-07-30: 2 [IU] via SUBCUTANEOUS
  Administered 2014-07-30 – 2014-07-31 (×2): 1 [IU] via SUBCUTANEOUS

## 2014-07-29 MED ORDER — LORAZEPAM 1 MG PO TABS: 1.0000 mg | ORAL_TABLET | Freq: Four times a day (QID) | ORAL | Status: DC | PRN

## 2014-07-29 MED ORDER — SODIUM CHLORIDE 0.9 % IV BOLUS (SEPSIS)
1000.0000 mL | Freq: Once | INTRAVENOUS | Status: AC
  Administered 2014-07-29: 1000 mL via INTRAVENOUS

## 2014-07-29 MED ORDER — LORAZEPAM 2 MG/ML IJ SOLN
0.0000 mg | Freq: Four times a day (QID) | INTRAMUSCULAR | Status: DC
  Administered 2014-07-30 – 2014-07-31 (×3): 1 mg via INTRAVENOUS
  Filled 2014-07-29 (×3): qty 1

## 2014-07-29 MED ORDER — FOLIC ACID 1 MG PO TABS
1.0000 mg | ORAL_TABLET | Freq: Every day | ORAL | Status: DC
  Administered 2014-07-29 – 2014-07-31 (×3): 1 mg via ORAL
  Filled 2014-07-29 (×3): qty 1

## 2014-07-29 MED ORDER — PANTOPRAZOLE SODIUM 40 MG IV SOLR
40.0000 mg | INTRAVENOUS | Status: DC
  Administered 2014-07-29: 40 mg via INTRAVENOUS
  Filled 2014-07-29 (×3): qty 40

## 2014-07-29 MED ORDER — ACETAMINOPHEN 650 MG RE SUPP: 650.0000 mg | Freq: Four times a day (QID) | RECTAL | Status: DC | PRN

## 2014-07-29 MED ORDER — HYDRALAZINE HCL 20 MG/ML IJ SOLN
10.0000 mg | INTRAMUSCULAR | Status: DC | PRN
  Administered 2014-07-29 – 2014-07-30 (×2): 10 mg via INTRAVENOUS
  Filled 2014-07-29 (×2): qty 1

## 2014-07-29 MED ORDER — ADULT MULTIVITAMIN W/MINERALS CH
1.0000 | ORAL_TABLET | Freq: Every day | ORAL | Status: DC
  Administered 2014-07-29 – 2014-07-31 (×3): 1 via ORAL
  Filled 2014-07-29 (×3): qty 1

## 2014-07-29 MED ORDER — LORAZEPAM 2 MG/ML IJ SOLN: 1.0000 mg | Freq: Four times a day (QID) | INTRAMUSCULAR | Status: DC | PRN

## 2014-07-29 MED ORDER — ENOXAPARIN SODIUM 40 MG/0.4ML ~~LOC~~ SOLN
40.0000 mg | SUBCUTANEOUS | Status: DC
  Administered 2014-07-29 – 2014-07-30 (×2): 40 mg via SUBCUTANEOUS
  Filled 2014-07-29 (×3): qty 0.4

## 2014-07-29 MED ORDER — LORAZEPAM 2 MG/ML IJ SOLN: 0.0000 mg | Freq: Two times a day (BID) | INTRAMUSCULAR | Status: DC

## 2014-07-29 MED ORDER — SODIUM CHLORIDE 0.9 % IV SOLN
INTRAVENOUS | Status: DC
  Administered 2014-07-29: 21:00:00 via INTRAVENOUS

## 2014-07-29 MED ORDER — ACETAMINOPHEN 325 MG PO TABS: 650.0000 mg | ORAL_TABLET | Freq: Four times a day (QID) | ORAL | Status: DC | PRN

## 2014-07-29 MED ORDER — ONDANSETRON HCL 4 MG PO TABS: 4.0000 mg | ORAL_TABLET | Freq: Four times a day (QID) | ORAL | Status: DC | PRN

## 2014-07-29 MED ORDER — THIAMINE HCL 100 MG/ML IJ SOLN
100.0000 mg | Freq: Every day | INTRAMUSCULAR | Status: DC
  Filled 2014-07-29 (×3): qty 1

## 2014-07-29 MED ORDER — ONDANSETRON HCL 4 MG/2ML IJ SOLN: 4.0000 mg | Freq: Four times a day (QID) | INTRAMUSCULAR | Status: DC | PRN

## 2014-07-29 NOTE — ED Notes (Signed)
Pt reports having generalized cramps in his stomach and back. Pt reports having a gout flair up that began on Thursday, which he states he began taking medication for it since then. Pt reports that this has happened before with a gout flair up that caused emesis. Pt reports nausea and emesis, however denies diarrhea. Pt states this last occurred five years ago with a gout attack. Pt states that he feels dehydrated, however he also states that he has been attempting to stay hydrated with fluids with electrolytes as well as taking his potassium.

## 2014-07-29 NOTE — ED Provider Notes (Signed)
CSN: [id number]     Arrival date & time 07/29/14  1624 History   First MD Initiated Contact with Patient 07/29/14 1708     Chief Complaint  Patient presents with  . Emesis  . Abdominal Cramping     (Consider location/radiation/quality/duration/timing/severity/associated sxs/prior Treatment) HPI Comments: Patient presents today with a chief complaint of nausea, vomiting, and diffuse abdominal cramping.  He states that these symptoms started yesterday.  He reports numerous episodes of vomiting.  He has not taken anything for his nausea.  He was diagnosed with a Gout flare up of his right elbow and right wrist three days ago and started taking Indomethecin two days ago.  He states that this medication has caused him to have vomiting and diarrhea in the past.  He reports numerous episodes of vomiting, but no diarrhea.  No blood in his emesis or blood in his stool.  Last BM was yesterday, which was normal.  He denies fever or chills.  Denies urinary symptoms.  Denies abdominal pain.  He reports that his Gout symptoms have improved.  He reports last alcohol use was six days ago.    Patient is a 38 y.o. male presenting with vomiting and cramps. The history is provided by the patient.  Emesis Abdominal Cramping Associated symptoms: vomiting     Past Medical History  Diagnosis Date  . Diabetes mellitus   . Gastritis    History reviewed. No pertinent past surgical history. No family history on file. History  Substance Use Topics  . Smoking status: Current Every Day Smoker -- 0.50 packs/day    Types: Cigarettes  . Smokeless tobacco: Never Used  . Alcohol Use: Yes     Comment: half gallon liquor    Review of Systems  Gastrointestinal: Positive for vomiting.  All other systems reviewed and are negative.     Allergies  Review of patient's allergies indicates no known allergies.  Home Medications   Prior to Admission medications   Medication Sig Start Date End Date Taking?  Authorizing Provider  indomethacin (INDOCIN) 25 MG capsule Take 25 mg by mouth 3 (three) times daily with meals.   Yes Historical Provider, MD  Potassium 99 MG TABS Take 99 mg by mouth daily.   Yes Historical Provider, MD   BP 167/112  Pulse 109  Temp(Src) 97.4 F (36.3 C)  Resp 20  SpO2 99% Physical Exam  Nursing note and vitals reviewed. Constitutional: He appears well-developed and well-nourished.  HENT:  Head: Normocephalic and atraumatic.  Neck: Normal range of motion. Neck supple.  Cardiovascular: Normal rate, regular rhythm and normal heart sounds.   Pulmonary/Chest: Effort normal and breath sounds normal.  Abdominal: Soft. Bowel sounds are normal. He exhibits no distension and no mass. There is no tenderness. There is no rebound and no guarding.  Musculoskeletal: Normal range of motion.  Neurological: He is alert.  Skin: Skin is warm and dry.  Psychiatric: He has a normal mood and affect.    ED Course  Procedures (including critical care time) Labs Review Labs Reviewed  CBC WITH DIFFERENTIAL - Abnormal; Notable for the following:    Platelets 140 (*)    Neutrophils Relative % 78 (*)    All other components within normal limits  COMPREHENSIVE METABOLIC PANEL - Abnormal; Notable for the following:    Sodium 128 (*)    Chloride 75 (*)    Glucose, Bld 240 (*)    BUN 32 (*)    Creatinine, Ser 2.72 (*)  Total Protein 9.7 (*)    GFR calc non Af Amer 28 (*)    GFR calc Af Amer 33 (*)    Anion gap 26 (*)    All other components within normal limits  URINALYSIS, ROUTINE W REFLEX MICROSCOPIC - Abnormal; Notable for the following:    Color, Urine AMBER (*)    APPearance TURBID (*)    Hgb urine dipstick MODERATE (*)    Bilirubin Urine LARGE (*)    Protein, ur 100 (*)    All other components within normal limits  URINE MICROSCOPIC-ADD ON - Abnormal; Notable for the following:    Bacteria, UA FEW (*)    Casts HYALINE CASTS (*)    All other components within normal  limits  CK - Abnormal; Notable for the following:    Total CK 629 (*)    All other components within normal limits  URIC ACID - Abnormal; Notable for the following:    Uric Acid, Serum 15.6 (*)    All other components within normal limits  URINE RAPID DRUG SCREEN (HOSP PERFORMED) - Abnormal; Notable for the following:    Tetrahydrocannabinol POSITIVE (*)    All other components within normal limits  GLUCOSE, CAPILLARY - Abnormal; Notable for the following:    Glucose-Capillary 154 (*)    All other components within normal limits  URINE CULTURE  LIPASE, BLOOD  TROPONIN I  SODIUM, URINE, RANDOM  CREATININE, URINE, RANDOM  HEMOGLOBIN A1C  COMPREHENSIVE METABOLIC PANEL  CBC WITH DIFFERENTIAL  TSH    Imaging Review No results found.   EKG Interpretation None      MDM   Final diagnoses:  None   Patient presents today with a chief complaint of nausea, vomiting, and diffuse abdominal cramping.  No abdominal pain on exam.  Therefore, no imaging ordered.  Labs show that the patient is hyperglycemia.  No known history of DM.   Patient also found to have an anion gap of 28.  Urine is negative for ketones.  UA negative for infection.  Labs also show an elevated Creatine of 2.72.  Baseline from two years ago is around 0.6.  Patient denies any known history of renal failure.  Patient given 2 Liters IVF in the ED.  Patient admitted to [facility] for further management.    Santiago Glad, PA-C 07/29/14 2240  Santiago Glad, PA-C 07/29/14 2241

## 2014-07-29 NOTE — H&P (Addendum)
[facility] History and Physical  Richard Lopez GGE:[id number] DOB: 05/30/1976 DOA: 07/29/2014  Referring physician: ER physician. PCP: Default, Provider, MD   Chief Complaint: Nausea vomiting.  HPI: Richard Lopez is a 38 y.o. male with history of gout presents to the ER because of persistent nausea vomiting over the last 3 days. Patient states he has recently established care with a PCP and since patient has had previous history of gout was prescribed when necessary indomethacin. Last week patient had some pain in his right elbow and wrist which patient felt was typical of his gout attack and had taken some indomethacin last Thursday 3 days ago following which patient has been having intractable nausea and vomiting. Denies any abdominal pain or diarrhea. Denies any chest pain or shortness of breath. In the ER patient's labs reveal elevated creatinine from his baseline done 2 years ago in our system. In addition patient UA shows hemoglobin but no RBCs. Labs also revealed hyperglycemia. Patient was admitted few years ago and at that time patient as per the records was diagnosed with diabetes and started on Amaryl which patient states he does not recall. Patient at this time will be admitted for intractable nausea vomiting with acute renal failure. Patient admits to having alcohol every other day.  Review of Systems: As presented in the history of presenting illness, rest negative.  Past Medical History  Diagnosis Date  . Diabetes mellitus   . Gastritis    Past Surgical History  Procedure Laterality Date  . No past surgeries     Social History:  reports that he has been smoking Cigarettes.  He has been smoking about 0.50 packs per day. He has never used smokeless tobacco. He reports that he drinks alcohol. He reports that he uses illicit drugs (Marijuana). Where does patient live  home. Can patient participate in ADLs?  Yes.  No Known Allergies  Family History:  Family History   Problem Relation Age of Onset  . Diabetes Mellitus II Father   . Diabetes Mellitus II Other   . CAD Other       Prior to Admission medications   Medication Sig Start Date End Date Taking? Authorizing Provider  indomethacin (INDOCIN) 25 MG capsule Take 25 mg by mouth 3 (three) times daily with meals.   Yes Historical Provider, MD  Potassium 99 MG TABS Take 99 mg by mouth daily.   Yes Historical Provider, MD    Physical Exam: Filed Vitals:   07/29/14 1637 07/29/14 1833 07/29/14 2013  BP: 167/112 167/109 171/108  Pulse: 109 99 101  Temp: 97.4 F (36.3 C) 98.1 F (36.7 C) 97.7 F (36.5 C)  TempSrc:  Oral Oral  Resp: 20 18 18   Height:   6\' 6"  (1.981 m)  Weight:   127.007 kg (280 lb)  SpO2: 99% 100% 100%     General:  Well-developed and nourished.  Eyes:  Anicteric no pallor.  ENT:  No discharge from the ears eyes nose mouth.  Neck:  No mass felt.  Cardiovascular:  S1-S2 heard.  Respiratory:  No rhonchi or crepitations.  Abdomen:  Soft nontender bowel sounds present. No guarding rigidity.  Skin:  No rash.  Musculoskeletal:  Mild swelling in his right elbow. Nontender.  Psychiatric:  Appears normal.  Neurologic:  Alert awake oriented to time place and person. Moves all extremities.  Labs on Admission:  Basic Metabolic Panel:  Recent Labs Lab 07/29/14 1747  NA 128*  K 3.7  CL 75*  CO2 27  GLUCOSE 240*  BUN 32*  CREATININE 2.72*  CALCIUM 9.4   Liver Function Tests:  Recent Labs Lab 07/29/14 1747  AST 30  ALT 18  ALKPHOS 91  BILITOT 0.8  PROT 9.7*  ALBUMIN 4.0   No results found for this basename: LIPASE, AMYLASE,  in the last 168 hours No results found for this basename: AMMONIA,  in the last 168 hours CBC:  Recent Labs Lab 07/29/14 1747  WBC 9.6  NEUTROABS 7.5  HGB 16.4  HCT 46.2  MCV 88.7  PLT 140*   Cardiac Enzymes: No results found for this basename: CKTOTAL, CKMB, CKMBINDEX, TROPONINI,  in the last 168 hours  BNP (last 3  results) No results found for this basename: PROBNP,  in the last 8760 hours CBG: No results found for this basename: GLUCAP,  in the last 168 hours  Radiological Exams on Admission: No results found.   Assessment/Plan Principal Problem:   Nausea with vomiting Active Problems:   ARF (acute renal failure)   Hyperglycemia   Elevated blood pressure   Nausea & vomiting   1. Intractable nausea vomiting - possibly secondary to gastritis versus viral gastroenteritis. At this time I have placed patient on clear liquid diet. Patient's abdomen appears benign. Check lipase and urine drug screen. For now I have placed patient on Protonix. 2. Acute renal failure - check FeNa. Since patient's urine shows hemoglobin but no RBCs I have ordered CK to rule out rhabdomyolysis. Check renal sonogram. Continue with IV fluids patient has received 2 L normal saline in the ER continued normal saline at 125 cc per hour. Closely follow intake output and metabolic panel. 3. Hyperglycemia - check hemoglobin A1c. For now I have placed patient on sliding-scale coverage. 4. Elevated blood pressure - for now I have placed patient on when necessary IV hydralazine. Closely follow blood pressure trends. 5. History of gout - check uric acid level and if elevated may need allopurinol. 6. Tobacco abuse and alcohol abuse - advised patient to quit drinking with regard to gout. Tobacco cessation counseling requested.    Code Status:  Full code.  Family Communication:  None.  Disposition Plan:  Admit to inpatient.    KAKRAKANDY,ARSHAD N. [facility] Pager [phone].  If 7PM-7AM, please contact night-coverage [email or web address] Password [facility] 07/29/2014, 8:35 PM

## 2014-07-30 ENCOUNTER — Inpatient Hospital Stay (HOSPITAL_COMMUNITY): Payer: Self-pay

## 2014-07-30 DIAGNOSIS — E876 Hypokalemia: Secondary | ICD-10-CM

## 2014-07-30 DIAGNOSIS — E871 Hypo-osmolality and hyponatremia: Secondary | ICD-10-CM

## 2014-07-30 DIAGNOSIS — M109 Gout, unspecified: Secondary | ICD-10-CM

## 2014-07-30 LAB — CBC WITH DIFFERENTIAL/PLATELET
Basophils Absolute: 0 10*3/uL (ref 0.0–0.1)
Basophils Relative: 0 % (ref 0–1)
Eosinophils Absolute: 0 10*3/uL (ref 0.0–0.7)
Eosinophils Relative: 0 % (ref 0–5)
HCT: 39.9 % (ref 39.0–52.0)
Hemoglobin: 13.7 g/dL (ref 13.0–17.0)
LYMPHS PCT: 37 % (ref 12–46)
Lymphs Abs: 3.1 10*3/uL (ref 0.7–4.0)
MCH: 30.4 pg (ref 26.0–34.0)
MCHC: 34.3 g/dL (ref 30.0–36.0)
MCV: 88.7 fL (ref 78.0–100.0)
Monocytes Absolute: 1.1 10*3/uL — ABNORMAL HIGH (ref 0.1–1.0)
Monocytes Relative: 13 % — ABNORMAL HIGH (ref 3–12)
NEUTROS ABS: 4.2 10*3/uL (ref 1.7–7.7)
NEUTROS PCT: 50 % (ref 43–77)
PLATELETS: 145 10*3/uL — AB (ref 150–400)
RBC: 4.5 MIL/uL (ref 4.22–5.81)
RDW: 14.6 % (ref 11.5–15.5)
WBC: 8.4 10*3/uL (ref 4.0–10.5)

## 2014-07-30 LAB — HEMOGLOBIN A1C
Hgb A1c MFr Bld: 6.8 % — ABNORMAL HIGH (ref <5.7)
MEAN PLASMA GLUCOSE: 148 mg/dL — AB (ref <117)

## 2014-07-30 LAB — COMPREHENSIVE METABOLIC PANEL
ALT: 17 U/L (ref 0–53)
AST: 36 U/L (ref 0–37)
Albumin: 3.1 g/dL — ABNORMAL LOW (ref 3.5–5.2)
Alkaline Phosphatase: 71 U/L (ref 39–117)
Anion gap: 19 — ABNORMAL HIGH (ref 5–15)
BUN: 33 mg/dL — ABNORMAL HIGH (ref 6–23)
CALCIUM: 8.2 mg/dL — AB (ref 8.4–10.5)
CO2: 27 meq/L (ref 19–32)
CREATININE: 1.6 mg/dL — AB (ref 0.50–1.35)
Chloride: 85 mEq/L — ABNORMAL LOW (ref 96–112)
GFR calc Af Amer: 62 mL/min — ABNORMAL LOW (ref >90)
GFR, EST NON AFRICAN AMERICAN: 54 mL/min — AB (ref >90)
Glucose, Bld: 114 mg/dL — ABNORMAL HIGH (ref 70–99)
Potassium: 2.5 mEq/L — CL (ref 3.7–5.3)
Sodium: 131 mEq/L — ABNORMAL LOW (ref 137–147)
Total Bilirubin: 0.8 mg/dL (ref 0.3–1.2)
Total Protein: 7.6 g/dL (ref 6.0–8.3)

## 2014-07-30 LAB — GLUCOSE, CAPILLARY
GLUCOSE-CAPILLARY: 97 mg/dL (ref 70–99)
Glucose-Capillary: 129 mg/dL — ABNORMAL HIGH (ref 70–99)
Glucose-Capillary: 156 mg/dL — ABNORMAL HIGH (ref 70–99)
Glucose-Capillary: 131 mg/dL — ABNORMAL HIGH (ref 70–99)

## 2014-07-30 LAB — CREATININE, URINE, RANDOM: Creatinine, Urine: 362.6 mg/dL

## 2014-07-30 LAB — TSH: TSH: 0.543 u[IU]/mL (ref 0.350–4.500)

## 2014-07-30 LAB — SODIUM, URINE, RANDOM: SODIUM UR: 24 meq/L

## 2014-07-30 LAB — MAGNESIUM: Magnesium: 1.4 mg/dL — ABNORMAL LOW (ref 1.5–2.5)

## 2014-07-30 MED ORDER — MORPHINE SULFATE 2 MG/ML IJ SOLN
1.0000 mg | INTRAMUSCULAR | Status: DC | PRN
  Administered 2014-07-30 (×2): 1 mg via INTRAVENOUS
  Filled 2014-07-30 (×2): qty 1

## 2014-07-30 MED ORDER — SODIUM CHLORIDE 0.9 % IV SOLN
INTRAVENOUS | Status: DC
  Administered 2014-07-30 (×2): via INTRAVENOUS

## 2014-07-30 MED ORDER — AMLODIPINE BESYLATE 10 MG PO TABS
10.0000 mg | ORAL_TABLET | Freq: Every day | ORAL | Status: DC
  Administered 2014-07-30 – 2014-07-31 (×2): 10 mg via ORAL
  Filled 2014-07-30 (×2): qty 1

## 2014-07-30 MED ORDER — POTASSIUM CHLORIDE 10 MEQ/100ML IV SOLN
10.0000 meq | INTRAVENOUS | Status: AC
  Administered 2014-07-30 (×4): 10 meq via INTRAVENOUS
  Filled 2014-07-30 (×4): qty 100

## 2014-07-30 MED ORDER — POTASSIUM CHLORIDE CRYS ER 20 MEQ PO TBCR: 40.0000 meq | EXTENDED_RELEASE_TABLET | Freq: Once | ORAL | Status: DC

## 2014-07-30 MED ORDER — COLCHICINE 0.6 MG PO TABS
0.6000 mg | ORAL_TABLET | Freq: Every day | ORAL | Status: DC
  Administered 2014-07-30 – 2014-07-31 (×2): 0.6 mg via ORAL
  Filled 2014-07-30 (×2): qty 1

## 2014-07-30 MED ORDER — POTASSIUM CHLORIDE CRYS ER 20 MEQ PO TBCR
40.0000 meq | EXTENDED_RELEASE_TABLET | Freq: Once | ORAL | Status: AC
  Administered 2014-07-30: 40 meq via ORAL
  Filled 2014-07-30: qty 2

## 2014-07-30 NOTE — Progress Notes (Signed)
CRITICAL VALUE ALERT  Critical value received:  Potassium 2.5   Date of notification:  07/30/14  Time of notification:  0630  Critical value read back:Yes.    Nurse who received alert:  Verner Chol   Notified (1st page):  Claiborne Billings  Time of first page:  [phone]

## 2014-07-30 NOTE — Progress Notes (Addendum)
[facility] PROGRESS NOTE  Richard Lopez AOZ:[id number] DOB: Aug 27, 1976 DOA: 07/29/2014 PCP: Default, Provider, MD  Assessment/Plan: 1. Nausea/vomiting -Improving this morning -Could be secondary to infectious gastroenteritis, and lipase level of 28 and white count of 8400. -Continue supportive care, IV fluid resuscitation  2. Acute kidney injury -Likely secondary to prerenal azotemia in setting of multiple episodes of nausea and vomiting. -He presented with a creatinine of 2.72, coming down to 1.6 after IV fluid resuscitation -Continue IV fluids, repeat lab work in a.m.  3. Hypokalemia -Likely related to GI losses from multiple episodes of nausea and vomiting. Lab work showing potassium of 2.5 -Patient receiving IV and by mouth potassium replacement  4. Gout -Starting colchicine 0.6 mg by mouth daily  5.  Hyponatremia -Likely secondary to dehydration, with labs showing a sodium of 128, improving to 131 with volume replacement  6. Hypertension. -Patient's systolic blood pressures in the 160s and 170s will start Norvasc  Code Status: Full code Family Communication:  Disposition Plan: Continue supportive care     HPI/Subjective: Patient is a pleasant 38 year old with a past medical history gout who presented to the emergency room on 07/30/2014 with complaints of nausea and vomiting. Initial lab work revealed the presence of acute kidney injury having creatinine of 2.72 and BUN of 32. He was also found to be hypokalemic with potassium of 2.5. He was admitted to the medicine service, started IV fluid hydration.  Objective: Filed Vitals:   07/30/14 0526  BP: 168/99  Pulse: 85  Temp: 98 F (36.7 C)  Resp: 18    Intake/Output Summary (Last 24 hours) at 07/30/14 1303 Last data filed at 07/30/14 0236  Gross per 24 hour  Intake      0 ml  Output    400 ml  Net   -400 ml   Filed Weights   07/29/14 2013 07/30/14 0500  Weight: 127.007 kg (280 lb) 116.121 kg (256 lb)     Exam:   General:  Patient is awake alert oriented, no acute distress  Cardiovascular: Regular rate and rhythm normal S1-S2  Respiratory: Normal respiratory effort lungs are clear to auscultation bilaterally  Abdomen: Soft nontender nondistended  Musculoskeletal: Reports left knee pain with passive and active movement  Data Reviewed: Basic Metabolic Panel:  Recent Labs Lab 07/29/14 1747 07/30/14 0518  NA 128* 131*  K 3.7 2.5*  CL 75* 85*  CO2 27 27  GLUCOSE 240* 114*  BUN 32* 33*  CREATININE 2.72* 1.60*  CALCIUM 9.4 8.2*  MG  --  1.4*   Liver Function Tests:  Recent Labs Lab 07/29/14 1747 07/30/14 0518  AST 30 36  ALT 18 17  ALKPHOS 91 71  BILITOT 0.8 0.8  PROT 9.7* 7.6  ALBUMIN 4.0 3.1*    Recent Labs Lab 07/29/14 2100  LIPASE 28   No results found for this basename: AMMONIA,  in the last 168 hours CBC:  Recent Labs Lab 07/29/14 1747 07/30/14 0518  WBC 9.6 8.4  NEUTROABS 7.5 4.2  HGB 16.4 13.7  HCT 46.2 39.9  MCV 88.7 88.7  PLT 140* 145*   Cardiac Enzymes:  Recent Labs Lab 07/29/14 2100  CKTOTAL 629*  TROPONINI <0.30   BNP (last 3 results) No results found for this basename: PROBNP,  in the last 8760 hours CBG:  Recent Labs Lab 07/29/14 2204 07/30/14 0729 07/30/14 1139  GLUCAP 154* 129* 156*    No results found for this or any previous visit (from the past 240  hour(s)).   Studies: US Renal  07/30/2014   CLINICAL DATA:  Acute renal failure.  EXAM: RENAL/URINARY TRACT ULTRASOUND COMPLETE  COMPARISON:  CT 05/03/2010.  FINDINGS: Right Kidney:  Length: 12.4 cm. Echogenicity within normal limits. No mass or hydronephrosis visualized.  Left Kidney:  Length: 12.9 cm. Echogenicity within normal limits. No mass or hydronephrosis visualized.  Bladder:  Appears normal for degree of bladder distention.  IMPRESSION: Normal exam.   Electronically Signed   By: Maisie Fus  Register   On: 07/30/2014 12:26    Scheduled Meds: . amLODipine  10  mg Oral Daily  . colchicine  0.6 mg Oral Daily  . enoxaparin (LOVENOX) injection  40 mg Subcutaneous Q24H  . folic acid  1 mg Oral Daily  . insulin aspart  0-9 Units Subcutaneous TID WC  . LORazepam  0-4 mg Intravenous Q6H   Followed by  . [START ON 07/31/2014] LORazepam  0-4 mg Intravenous Q12H  . multivitamin with minerals  1 tablet Oral Daily  . pantoprazole (PROTONIX) IV  40 mg Intravenous Q24H  . potassium chloride  10 mEq Intravenous Q1 Hr x 4  . potassium chloride  40 mEq Oral Once  . thiamine  100 mg Oral Daily   Or  . thiamine  100 mg Intravenous Daily   Continuous Infusions: . sodium chloride 125 mL/hr at 07/30/14 0730    Principal Problem:   Nausea with vomiting Active Problems:   ARF (acute renal failure)   Hyperglycemia   Elevated blood pressure   Nausea & vomiting    Time spent: 30 min    Jeralyn Bennett  [facility] Pager [phone] If 7PM-7AM, please contact night-coverage at [email or web address], password [facility] 07/30/2014, 1:03 PM  LOS: 1 day

## 2014-07-31 LAB — CBC
HCT: 38.6 % — ABNORMAL LOW (ref 39.0–52.0)
HEMOGLOBIN: 13.1 g/dL (ref 13.0–17.0)
MCH: 29.8 pg (ref 26.0–34.0)
MCHC: 33.9 g/dL (ref 30.0–36.0)
MCV: 87.9 fL (ref 78.0–100.0)
Platelets: 180 10*3/uL (ref 150–400)
RBC: 4.39 MIL/uL (ref 4.22–5.81)
RDW: 14.3 % (ref 11.5–15.5)
WBC: 5.6 10*3/uL (ref 4.0–10.5)

## 2014-07-31 LAB — BASIC METABOLIC PANEL
Anion gap: 16 — ABNORMAL HIGH (ref 5–15)
BUN: 18 mg/dL (ref 6–23)
CALCIUM: 8.5 mg/dL (ref 8.4–10.5)
CHLORIDE: 94 meq/L — AB (ref 96–112)
CO2: 24 meq/L (ref 19–32)
Creatinine, Ser: 0.81 mg/dL (ref 0.50–1.35)
GFR calc Af Amer: >90 mL/min (ref >90)
GFR calc non Af Amer: >90 mL/min (ref >90)
GLUCOSE: 123 mg/dL — AB (ref 70–99)
POTASSIUM: 3.3 meq/L — AB (ref 3.7–5.3)
SODIUM: 134 meq/L — AB (ref 137–147)

## 2014-07-31 LAB — GLUCOSE, CAPILLARY
Glucose-Capillary: 123 mg/dL — ABNORMAL HIGH (ref 70–99)
Glucose-Capillary: 151 mg/dL — ABNORMAL HIGH (ref 70–99)

## 2014-07-31 LAB — URINE CULTURE
Colony Count: NO GROWTH
Culture: NO GROWTH

## 2014-07-31 MED ORDER — POTASSIUM CHLORIDE ER 10 MEQ PO TBCR: 20.0000 meq | EXTENDED_RELEASE_TABLET | Freq: Every day | ORAL | Status: DC

## 2014-07-31 MED ORDER — ASPIRIN EC 81 MG PO TBEC: 81.0000 mg | DELAYED_RELEASE_TABLET | Freq: Every day | ORAL | Status: DC

## 2014-07-31 MED ORDER — COLCHICINE 0.6 MG PO TABS: 0.6000 mg | ORAL_TABLET | Freq: Every day | ORAL | Status: DC

## 2014-07-31 MED ORDER — POTASSIUM CHLORIDE CRYS ER 20 MEQ PO TBCR
40.0000 meq | EXTENDED_RELEASE_TABLET | Freq: Once | ORAL | Status: AC
  Administered 2014-07-31: 40 meq via ORAL
  Filled 2014-07-31: qty 2

## 2014-07-31 MED ORDER — AMLODIPINE BESYLATE 10 MG PO TABS: 10.0000 mg | ORAL_TABLET | Freq: Every day | ORAL | Status: DC

## 2014-07-31 NOTE — Discharge Summary (Signed)
Physician Discharge Summary  Richard Lopez YQI:[id number] DOB: 09-29-1976 DOA: 07/29/2014  PCP: Default, Provider, MD  Admit date: 07/29/2014 Discharge date: 07/31/2014  Time spent: 35 minutes  Recommendations for Outpatient Follow-up:  1. Please follow up on blood pressures, I started him on Norvasc during this hospitalization for hypertension 2. Obtain a BMP on his hospital follow up visit.    Discharge Diagnoses:  Principal Problem:   Nausea with vomiting Active Problems:   ARF (acute renal failure)   Hyperglycemia   Elevated blood pressure   Nausea & vomiting   Discharge Condition: Stable  Diet recommendation: Heart Healthy  Filed Weights   07/29/14 2013 07/30/14 0500 07/31/14 0900  Weight: 127.007 kg (280 lb) 116.121 kg (256 lb) 119.251 kg (262 lb 14.4 oz)    History of present illness:  Richard Lopez is a 38 y.o. male with history of gout presents to the ER because of persistent nausea vomiting over the last 3 days. Patient states he has recently established care with a PCP and since patient has had previous history of gout was prescribed when necessary indomethacin. Last week patient had some pain in his right elbow and wrist which patient felt was typical of his gout attack and had taken some indomethacin last Thursday 3 days ago following which patient has been having intractable nausea and vomiting. Denies any abdominal pain or diarrhea. Denies any chest pain or shortness of breath. In the ER patient's labs reveal elevated creatinine from his baseline done 2 years ago in our system. In addition patient UA shows hemoglobin but no RBCs. Labs also revealed hyperglycemia. Patient was admitted few years ago and at that time patient as per the records was diagnosed with diabetes and started on Amaryl which patient states he does not recall. Patient at this time will be admitted for intractable nausea vomiting with acute renal failure. Patient admits to having alcohol every other  day   Hospital Course:  Patient is a pleasant 38 year old gentleman with a past medical history of gout, who was admitted to the medicine service on 07/30/2014 presented with multiple episodes of nausea and vomiting. Initial lab work revealed acute kidney injury having a creatinine of 2.72 with BUN of 32. He was also found to be hypokalemic having a potassium of 2.5 which was replaced. Hypokalemia likely secondary to GI loss from multiple episodes of nausea and vomiting. During this hospitalization he was given IV fluid resuscitation. Nausea vomiting felt to be secondary to infectious gastroenteritis, symptoms significantly improving over the course of his hospitalization. During this hospitalization he was noted to have elevated blood pressures consistently for which I started him on Norvasc at 10 mg by mouth daily and aspirin 81 mg by mouth daily for primary prevention. Please followup on blood pressures in the outpatient setting. Other issues addressed during this hospitalization included starting colchicine for gout. Lab work improved by the day of discharge as his creatinine came down to 0.81 and potassium trending up to 3.3. On the day of discharge he was given additional oral potassium replacement. He was discharged to his home in stable condition on 07/31/2014.   Discharge Exam: Filed Vitals:   07/31/14 0556  BP: 159/95  Pulse: 78  Temp: 97.5 F (36.4 C)  Resp: 18    General: No acute distress, states feeling better Cardiovascular: Regular rate and rhythm normal S1-S2 no murmurs rubs or gallops Respiratory: Normal respiratory effort, lungs are clear to auscultation bilaterally Abdomen: Soft nontender nondistended  Discharge Instructions  You were cared for by a hospitalist during your hospital stay. If you have any questions about your discharge medications or the care you received while you were in the hospital after you are discharged, you can call the unit and asked to speak with the  hospitalist on call if the hospitalist that took care of you is not available. Once you are discharged, your primary care physician will handle any further medical issues. Please note that NO REFILLS for any discharge medications will be authorized once you are discharged, as it is imperative that you return to your primary care physician (or establish a relationship with a primary care physician if you do not have one) for your aftercare needs so that they can reassess your need for medications and monitor your lab values.  Discharge Instructions   Call MD for:  difficulty breathing, headache or visual disturbances    Complete by:  As directed      Call MD for:  extreme fatigue    Complete by:  As directed      Call MD for:  hives    Complete by:  As directed      Call MD for:  persistant dizziness or light-headedness    Complete by:  As directed      Call MD for:  persistant nausea and vomiting    Complete by:  As directed      Call MD for:  redness, tenderness, or signs of infection (pain, swelling, redness, odor or green/yellow discharge around incision site)    Complete by:  As directed      Call MD for:  severe uncontrolled pain    Complete by:  As directed      Call MD for:  temperature >100.4    Complete by:  As directed      Diet - low sodium heart healthy    Complete by:  As directed      Discharge instructions    Complete by:  As directed   Please follow up with your Family Physician in 1-2 week     Increase activity slowly    Complete by:  As directed           Current Discharge Medication List    START taking these medications   Details  amLODipine (NORVASC) 10 MG tablet Take 1 tablet (10 mg total) by mouth daily. Qty: 30 tablet, Refills: 1    aspirin EC 81 MG tablet Take 1 tablet (81 mg total) by mouth daily. Qty: 30 tablet, Refills: 0    colchicine 0.6 MG tablet Take 1 tablet (0.6 mg total) by mouth daily. Qty: 30 tablet, Refills: 1    potassium chloride  (K-DUR) 10 MEQ tablet Take 2 tablets (20 mEq total) by mouth daily. Qty: 7 tablet, Refills: 0      STOP taking these medications     indomethacin (INDOCIN) 25 MG capsule      Potassium 99 MG TABS        No Known Allergies Follow-up Information   Follow up with Default, Provider, MD.       The results of significant diagnostics from this hospitalization (including imaging, microbiology, ancillary and laboratory) are listed below for reference.    Significant Diagnostic Studies: US Renal  07/30/2014   CLINICAL DATA:  Acute renal failure.  EXAM: RENAL/URINARY TRACT ULTRASOUND COMPLETE  COMPARISON:  CT 05/03/2010.  FINDINGS: Right Kidney:  Length: 12.4 cm. Echogenicity within normal limits. No mass or hydronephrosis visualized.  Left Kidney:  Length: 12.9 cm. Echogenicity within normal limits. No mass or hydronephrosis visualized.  Bladder:  Appears normal for degree of bladder distention.  IMPRESSION: Normal exam.   Electronically Signed   By: Maisie Fus  Register   On: 07/30/2014 12:26    Microbiology: Recent Results (from the past 240 hour(s))  URINE CULTURE     Status: None   Collection Time    07/29/14  9:15 PM      Result Value Ref Range Status   Specimen Description URINE, RANDOM   Final   Special Requests NONE   Final   Culture  Setup Time     Final   Value: 07/30/2014 01:22     Performed at [facility] Count     Final   Value: NO GROWTH     Performed at [facility]   Culture     Final   Value: NO GROWTH     Performed at [facility]   Report Status 07/31/2014 FINAL   Final     Labs: Basic Metabolic Panel:  Recent Labs Lab 07/29/14 1747 07/30/14 0518 07/31/14 0449  NA 128* 131* 134*  K 3.7 2.5* 3.3*  CL 75* 85* 94*  CO2 27 27 24   GLUCOSE 240* 114* 123*  BUN 32* 33* 18  CREATININE 2.72* 1.60* 0.81  CALCIUM 9.4 8.2* 8.5  MG  --  1.4*  --    Liver Function Tests:  Recent Labs Lab 07/29/14 1747 07/30/14 0518  AST 30  36  ALT 18 17  ALKPHOS 91 71  BILITOT 0.8 0.8  PROT 9.7* 7.6  ALBUMIN 4.0 3.1*    Recent Labs Lab 07/29/14 2100  LIPASE 28   No results found for this basename: AMMONIA,  in the last 168 hours CBC:  Recent Labs Lab 07/29/14 1747 07/30/14 0518 07/31/14 0449  WBC 9.6 8.4 5.6  NEUTROABS 7.5 4.2  --   HGB 16.4 13.7 13.1  HCT 46.2 39.9 38.6*  MCV 88.7 88.7 87.9  PLT 140* 145* 180   Cardiac Enzymes:  Recent Labs Lab 07/29/14 2100  CKTOTAL 629*  TROPONINI <0.30   BNP: BNP (last 3 results) No results found for this basename: PROBNP,  in the last 8760 hours CBG:  Recent Labs Lab 07/30/14 1139 07/30/14 1639 07/30/14 2154 07/31/14 0743 07/31/14 1130  GLUCAP 156* 97 131* 123* 151*       Signed:  Zandon Talton  [facility] 07/31/2014, 11:42 AM

## 2014-07-31 NOTE — Care Management Note (Signed)
    Page 1 of 1   07/31/2014     1:00:09 PM CARE MANAGEMENT NOTE 07/31/2014  Patient:  Richard Lopez, Richard Lopez   Account Number:  [id number]  Date Initiated:  07/31/2014  Documentation initiated by:  Lanier Clam  Subjective/Objective Assessment:   38 Y/O M ADMITTED W/N/V.     Action/Plan:   FROM HOME.   Anticipated DC Date:  07/31/2014   Anticipated DC Plan:  HOME/SELF CARE      DC Planning Services  CM consult      Choice offered to / List presented to:             Status of service:  Completed, signed off Medicare Important Message given?   (If response is "NO", the following Medicare IM given date fields will be blank) Date Medicare IM given:   Medicare IM given by:   Date Additional Medicare IM given:   Additional Medicare IM given by:    Discharge Disposition:  HOME/SELF CARE  Per UR Regulation:  Reviewed for med. necessity/level of care/duration of stay  If discussed at Long Length of Stay Meetings, dates discussed:    Comments:  07/31/14 Elnita Surprenant RN,BSN NCM [phone] NO D/C NEEDS OR ORDERS.

## 2014-08-03 NOTE — ED Provider Notes (Signed)
Medical screening examination/treatment/procedure(s) were performed by non-physician practitioner and as supervising physician I was immediately available for consultation/collaboration.   EKG Interpretation None       Elgin Carn, MD 08/03/14 1345 

## 2015-03-21 ENCOUNTER — Encounter (HOSPITAL_COMMUNITY): Payer: Self-pay

## 2015-03-21 ENCOUNTER — Emergency Department (HOSPITAL_COMMUNITY)
Admission: EM | Admit: 2015-03-21 | Discharge: 2015-03-21 | Disposition: A | Discharge status: Home / Self Care | Payer: Self-pay | Attending: Emergency Medicine | Admitting: Emergency Medicine

## 2015-03-21 DIAGNOSIS — E119 Type 2 diabetes mellitus without complications: Secondary | ICD-10-CM | POA: Insufficient documentation

## 2015-03-21 DIAGNOSIS — Z720 Tobacco use: Secondary | ICD-10-CM | POA: Insufficient documentation

## 2015-03-21 DIAGNOSIS — Z7982 Long term (current) use of aspirin: Secondary | ICD-10-CM | POA: Insufficient documentation

## 2015-03-21 DIAGNOSIS — Z79899 Other long term (current) drug therapy: Secondary | ICD-10-CM | POA: Insufficient documentation

## 2015-03-21 DIAGNOSIS — M25562 Pain in left knee: Secondary | ICD-10-CM | POA: Insufficient documentation

## 2015-03-21 DIAGNOSIS — M109 Gout, unspecified: Secondary | ICD-10-CM | POA: Insufficient documentation

## 2015-03-21 DIAGNOSIS — Z8719 Personal history of other diseases of the digestive system: Secondary | ICD-10-CM | POA: Insufficient documentation

## 2015-03-21 HISTORY — DX: Gout, unspecified: M10.9

## 2015-03-21 MED ORDER — OXYCODONE-ACETAMINOPHEN 5-325 MG PO TABS
2.0000 | ORAL_TABLET | Freq: Once | ORAL | Status: AC
  Administered 2015-03-21: 2 via ORAL
  Filled 2015-03-21: qty 2

## 2015-03-21 MED ORDER — LIDOCAINE-EPINEPHRINE 2 %-1:100000 IJ SOLN
20.0000 mL | Freq: Once | INTRAMUSCULAR | Status: AC
  Administered 2015-03-21: 20 mL via INTRADERMAL
  Filled 2015-03-21: qty 1

## 2015-03-21 MED ORDER — INDOMETHACIN 25 MG PO CAPS: 25.0000 mg | ORAL_CAPSULE | Freq: Three times a day (TID) | ORAL | Status: DC | PRN

## 2015-03-21 MED ORDER — OXYCODONE-ACETAMINOPHEN 5-325 MG PO TABS: 1.0000 | ORAL_TABLET | Freq: Four times a day (QID) | ORAL | Status: DC | PRN

## 2015-03-21 NOTE — ED Provider Notes (Signed)
CSN: [id number]     Arrival date & time 03/21/15  1713 History   First MD Initiated Contact with Patient 03/21/15 1751     Chief Complaint  Patient presents with  . Joint Swelling  . Joint Pain     (Consider location/radiation/quality/duration/timing/severity/associated sxs/prior Treatment) Patient is a 39 y.o. male presenting with knee pain. The history is provided by the patient.  Knee Pain Location:  Knee Time since incident:  6 days Injury: no   Knee location:  L knee Pain details:    Quality:  Aching   Radiates to:  Does not radiate   Severity:  Moderate   Onset quality:  Gradual   Duration:  6 days   Timing:  Constant   Progression:  Unchanged Chronicity:  Recurrent Dislocation: no   Foreign body present:  No foreign bodies Prior injury to area:  No Relieved by:  NSAIDs Worsened by:  Activity Ineffective treatments:  None tried Associated symptoms: no fever and no neck pain     Past Medical History  Diagnosis Date  . Diabetes mellitus   . Gastritis   . Gout    Past Surgical History  Procedure Laterality Date  . No past surgeries     Family History  Problem Relation Age of Onset  . Diabetes Mellitus II Father   . Diabetes Mellitus II Other   . CAD Other    History  Substance Use Topics  . Smoking status: Current Every Day Smoker -- 0.00 packs/day    Types: Cigarettes  . Smokeless tobacco: Never Used  . Alcohol Use: Yes    Review of Systems  Constitutional: Negative for fever.  HENT: Negative for drooling and rhinorrhea.   Eyes: Negative for pain.  Respiratory: Negative for cough and shortness of breath.   Cardiovascular: Negative for chest pain and leg swelling.  Gastrointestinal: Negative for nausea, vomiting, abdominal pain and diarrhea.  Genitourinary: Negative for dysuria and hematuria.  Musculoskeletal: Positive for arthralgias. Negative for gait problem and neck pain.  Skin: Negative for color change.  Neurological: Negative for numbness  and headaches.  Hematological: Negative for adenopathy.  Psychiatric/Behavioral: Negative for behavioral problems.  All other systems reviewed and are negative.     Allergies  Review of patient's allergies indicates no known allergies.  Home Medications   Prior to Admission medications   Medication Sig Start Date End Date Taking? Authorizing Provider  amLODipine (NORVASC) 10 MG tablet Take 1 tablet (10 mg total) by mouth daily. 07/31/14   Jeralyn Bennett, MD  aspirin EC 81 MG tablet Take 1 tablet (81 mg total) by mouth daily. 07/31/14   Jeralyn Bennett, MD  colchicine 0.6 MG tablet Take 1 tablet (0.6 mg total) by mouth daily. 07/31/14   Jeralyn Bennett, MD  potassium chloride (K-DUR) 10 MEQ tablet Take 2 tablets (20 mEq total) by mouth daily. 07/31/14   Jeralyn Bennett, MD   BP 162/91 mmHg  Pulse 96  Temp(Src) 98.8 F (37.1 C) (Oral)  Resp 20  SpO2 100% Physical Exam  Constitutional: He is oriented to person, place, and time. He appears well-developed and well-nourished.  HENT:  Head: Normocephalic and atraumatic.  Right Ear: External ear normal.  Left Ear: External ear normal.  Nose: Nose normal.  Mouth/Throat: Oropharynx is clear and moist. No oropharyngeal exudate.  Eyes: Conjunctivae and EOM are normal. Pupils are equal, round, and reactive to light.  Neck: Normal range of motion. Neck supple.  Cardiovascular: Normal rate, regular rhythm, normal heart sounds  and intact distal pulses.  Exam reveals no gallop and no friction rub.   No murmur heard. Pulmonary/Chest: Effort normal and breath sounds normal. No respiratory distress. He has no wheezes.  Abdominal: Soft. Bowel sounds are normal. He exhibits no distension. There is no tenderness. There is no rebound and no guarding.  Musculoskeletal: Normal range of motion. He exhibits tenderness. He exhibits no edema.  Mildly limited range of motion of the left knee. Large joint effusion noted. No erythema or redness seen.  2+  distal pulses in bilateral lower extremity.  Neurological: He is alert and oriented to person, place, and time.  Skin: Skin is warm and dry.  Psychiatric: He has a normal mood and affect. His behavior is normal.  Nursing note and vitals reviewed.   ED Course  ARTHOCENTESIS Date/Time: 03/21/2015 8:23 PM Performed by: Purvis Sheffield Authorized by: Purvis Sheffield Consent: Verbal consent obtained. Written consent obtained. Risks and benefits: risks, benefits and alternatives were discussed Consent given by: patient Patient understanding: patient states understanding of the procedure being performed Patient consent: the patient's understanding of the procedure matches consent given Procedure consent: procedure consent matches procedure scheduled Relevant documents: relevant documents present and verified Test results: test results available and properly labeled Site marked: the operative site was marked Required items: required blood products, implants, devices, and special equipment available Patient identity confirmed: verbally with patient, arm band, provided demographic data and hospital-assigned identification number Time out: Immediately prior to procedure a "time out" was called to verify the correct patient, procedure, equipment, support staff and site/side marked as required. Indications: joint swelling and pain  Body area: knee Joint: left knee Local anesthesia used: yes Local anesthetic: lidocaine 2% with epinephrine Anesthetic total: 2 ml Patient sedated: no Preparation: Patient was prepped and draped in the usual sterile fashion. Needle gauge: 18 G Ultrasound guidance: no Approach: superior and medial. Aspirate: serous and yellow Aspirate amount: 60 mL Patient tolerance: Patient tolerated the procedure well with no immediate complications   (including critical care time) Labs Review Labs Reviewed - No data to display  Imaging Review No results found.   EKG  Interpretation None      MDM   Final diagnoses:  Left knee pain    6:44 PM 39 y.o. male w hx of DM, gout who presents with left knee pain and swelling consistent with previous gout exacerbations. Recently he has had left elbow and bilateral hand pain consistent with gout exacerbations. He has been taking ibuprofen with moderate relief. For the last 6 days he has developed left knee pain and swelling. He denies any fevers. He denies any injuries. He has a large left knee effusion on exam. Do not think this is a septic joint. Given the size of the effusion he would likely get relief from decompression. Will perform arthrocentesis and provide pain control.  8:21 PM: Pt continues to appear well. The patient tolerated the arthrocentesis well. 60 cc of serous yellow fluid was removed from the joint. I do not think the fluid needs further analysis given the fact that he has had gout many times previously.  I have discussed the diagnosis/risks/treatment options with the patient and believe the pt to be eligible for discharge home to follow-up with his pcp as needed. We also discussed returning to the ED immediately if new or worsening sx occur. We discussed the sx which are most concerning (e.g., redness, swelling, fever, worsening pain) that necessitate immediate return. Medications administered to the patient during their  visit and any new prescriptions provided to the patient are listed below.  Medications given during this visit Medications  lidocaine-EPINEPHrine (XYLOCAINE W/EPI) 2 %-1:100000 (with pres) injection 20 mL (not administered)  oxyCODONE-acetaminophen (PERCOCET/ROXICET) 5-325 MG per tablet 2 tablet (2 tablets Oral Given 03/21/15 1843)    New Prescriptions   INDOMETHACIN (INDOCIN) 25 MG CAPSULE    Take 1 capsule (25 mg total) by mouth 3 (three) times daily as needed.   OXYCODONE-ACETAMINOPHEN (PERCOCET) 5-325 MG PER TABLET    Take 1-2 tablets by mouth every 6 (six) hours as needed for  moderate pain.      Purvis Sheffield, MD 03/21/15 2024

## 2015-03-21 NOTE — Discharge Instructions (Signed)

## 2015-03-21 NOTE — ED Notes (Addendum)
Pt c/o increasing L elbow, L knee, and bilateral hand pain and swelling x 6 days.  Pain score 10/10. Denies injury.  Hx of gout.  Pt reports to taking a "holistic" medication w/ "a little help."  Swelling noted to all areas.

## 2015-03-21 NOTE — Progress Notes (Signed)
EDCM spoke to patient at bedside.  Patient confirms he has the orange card and his pcp is located at the [facility] of [place].  System updated.

## 2017-04-01 ENCOUNTER — Encounter (HOSPITAL_COMMUNITY): Payer: Self-pay | Admitting: Internal Medicine

## 2017-04-01 ENCOUNTER — Observation Stay (HOSPITAL_COMMUNITY)
Admission: EM | Admit: 2017-04-01 | Discharge: 2017-04-02 | Disposition: A | Discharge status: Home / Self Care | Payer: Self-pay | Attending: Internal Medicine | Admitting: Internal Medicine

## 2017-04-01 ENCOUNTER — Emergency Department (HOSPITAL_COMMUNITY): Payer: Self-pay

## 2017-04-01 DIAGNOSIS — E669 Obesity, unspecified: Secondary | ICD-10-CM

## 2017-04-01 DIAGNOSIS — K760 Fatty (change of) liver, not elsewhere classified: Secondary | ICD-10-CM | POA: Insufficient documentation

## 2017-04-01 DIAGNOSIS — E1165 Type 2 diabetes mellitus with hyperglycemia: Secondary | ICD-10-CM | POA: Insufficient documentation

## 2017-04-01 DIAGNOSIS — K2970 Gastritis, unspecified, without bleeding: Principal | ICD-10-CM

## 2017-04-01 DIAGNOSIS — Z7982 Long term (current) use of aspirin: Secondary | ICD-10-CM | POA: Insufficient documentation

## 2017-04-01 DIAGNOSIS — M109 Gout, unspecified: Secondary | ICD-10-CM | POA: Insufficient documentation

## 2017-04-01 DIAGNOSIS — F1210 Cannabis abuse, uncomplicated: Secondary | ICD-10-CM

## 2017-04-01 DIAGNOSIS — K8020 Calculus of gallbladder without cholecystitis without obstruction: Secondary | ICD-10-CM | POA: Insufficient documentation

## 2017-04-01 DIAGNOSIS — R1010 Upper abdominal pain, unspecified: Secondary | ICD-10-CM

## 2017-04-01 DIAGNOSIS — E1169 Type 2 diabetes mellitus with other specified complication: Secondary | ICD-10-CM | POA: Diagnosis present

## 2017-04-01 DIAGNOSIS — R7303 Prediabetes: Secondary | ICD-10-CM

## 2017-04-01 DIAGNOSIS — I10 Essential (primary) hypertension: Secondary | ICD-10-CM | POA: Diagnosis present

## 2017-04-01 DIAGNOSIS — Z79899 Other long term (current) drug therapy: Secondary | ICD-10-CM | POA: Insufficient documentation

## 2017-04-01 DIAGNOSIS — R112 Nausea with vomiting, unspecified: Secondary | ICD-10-CM | POA: Diagnosis present

## 2017-04-01 HISTORY — DX: Essential (primary) hypertension: I10

## 2017-04-01 LAB — COMPREHENSIVE METABOLIC PANEL
ALK PHOS: 61 U/L (ref 38–126)
ALT: 25 U/L (ref 17–63)
AST: 42 U/L — ABNORMAL HIGH (ref 15–41)
Albumin: 4.2 g/dL (ref 3.5–5.0)
Anion gap: 16 — ABNORMAL HIGH (ref 5–15)
BILIRUBIN TOTAL: 1.5 mg/dL — AB (ref 0.3–1.2)
BUN: 9 mg/dL (ref 6–20)
CHLORIDE: 97 mmol/L — AB (ref 101–111)
CO2: 25 mmol/L (ref 22–32)
CREATININE: 0.83 mg/dL (ref 0.61–1.24)
Calcium: 9.3 mg/dL (ref 8.9–10.3)
Glucose, Bld: 168 mg/dL — ABNORMAL HIGH (ref 65–99)
Potassium: 3.4 mmol/L — ABNORMAL LOW (ref 3.5–5.1)
Sodium: 138 mmol/L (ref 135–145)
TOTAL PROTEIN: 8.2 g/dL — AB (ref 6.5–8.1)

## 2017-04-01 LAB — CBC
HCT: 40.6 % (ref 39.0–52.0)
Hemoglobin: 14.1 g/dL (ref 13.0–17.0)
MCH: 30.4 pg (ref 26.0–34.0)
MCHC: 34.7 g/dL (ref 30.0–36.0)
MCV: 87.5 fL (ref 78.0–100.0)
PLATELETS: 198 10*3/uL (ref 150–400)
RBC: 4.64 MIL/uL (ref 4.22–5.81)
RDW: 16.1 % — ABNORMAL HIGH (ref 11.5–15.5)
WBC: 7.6 10*3/uL (ref 4.0–10.5)

## 2017-04-01 LAB — URINALYSIS, ROUTINE W REFLEX MICROSCOPIC
Bilirubin Urine: NEGATIVE
GLUCOSE, UA: NEGATIVE mg/dL
Hgb urine dipstick: NEGATIVE
Ketones, ur: 20 mg/dL — AB
NITRITE: NEGATIVE
PH: 6 (ref 5.0–8.0)
Protein, ur: 100 mg/dL — AB
SPECIFIC GRAVITY, URINE: 1.02 (ref 1.005–1.030)

## 2017-04-01 LAB — ETHANOL: Alcohol, Ethyl (B): <5 mg/dL (ref <5)

## 2017-04-01 LAB — RAPID URINE DRUG SCREEN, HOSP PERFORMED
AMPHETAMINES: NOT DETECTED
BARBITURATES: NOT DETECTED
Benzodiazepines: NOT DETECTED
Cocaine: NOT DETECTED
OPIATES: NOT DETECTED
TETRAHYDROCANNABINOL: POSITIVE — AB

## 2017-04-01 LAB — GLUCOSE, CAPILLARY
GLUCOSE-CAPILLARY: 99 mg/dL (ref 65–99)
GLUCOSE-CAPILLARY: 148 mg/dL — AB (ref 65–99)
Glucose-Capillary: 144 mg/dL — ABNORMAL HIGH (ref 65–99)

## 2017-04-01 LAB — LIPASE, BLOOD: LIPASE: 22 U/L (ref 11–51)

## 2017-04-01 LAB — TSH: TSH: 0.938 u[IU]/mL (ref 0.350–4.500)

## 2017-04-01 MED ORDER — PANTOPRAZOLE SODIUM 40 MG IV SOLR
40.0000 mg | Freq: Two times a day (BID) | INTRAVENOUS | Status: DC
  Administered 2017-04-01 – 2017-04-02 (×3): 40 mg via INTRAVENOUS
  Filled 2017-04-01 (×3): qty 40

## 2017-04-01 MED ORDER — METOCLOPRAMIDE HCL 5 MG/ML IJ SOLN
10.0000 mg | INTRAMUSCULAR | Status: AC
  Administered 2017-04-01: 10 mg via INTRAVENOUS
  Filled 2017-04-01: qty 2

## 2017-04-01 MED ORDER — PANTOPRAZOLE SODIUM 40 MG IV SOLR
40.0000 mg | Freq: Once | INTRAVENOUS | Status: AC
  Administered 2017-04-01: 40 mg via INTRAVENOUS
  Filled 2017-04-01: qty 40

## 2017-04-01 MED ORDER — ONDANSETRON HCL 4 MG/2ML IJ SOLN: 4.0000 mg | Freq: Four times a day (QID) | INTRAMUSCULAR | Status: DC | PRN

## 2017-04-01 MED ORDER — PROMETHAZINE HCL 25 MG/ML IJ SOLN
12.5000 mg | Freq: Once | INTRAMUSCULAR | Status: AC
  Administered 2017-04-01: 12.5 mg via INTRAVENOUS
  Filled 2017-04-01: qty 1

## 2017-04-01 MED ORDER — ONDANSETRON 4 MG PO TBDP
4.0000 mg | ORAL_TABLET | Freq: Once | ORAL | Status: AC | PRN
  Administered 2017-04-01: 4 mg via ORAL
  Filled 2017-04-01: qty 1

## 2017-04-01 MED ORDER — SODIUM CHLORIDE 0.9 % IV BOLUS (SEPSIS)
1000.0000 mL | Freq: Once | INTRAVENOUS | Status: AC
  Administered 2017-04-01: 1000 mL via INTRAVENOUS

## 2017-04-01 MED ORDER — BISACODYL 5 MG PO TBEC: 5.0000 mg | DELAYED_RELEASE_TABLET | Freq: Every day | ORAL | Status: DC | PRN

## 2017-04-01 MED ORDER — AMLODIPINE BESYLATE 5 MG PO TABS
5.0000 mg | ORAL_TABLET | Freq: Every day | ORAL | Status: DC
  Administered 2017-04-02: 5 mg via ORAL
  Filled 2017-04-01: qty 1

## 2017-04-01 MED ORDER — GI COCKTAIL ~~LOC~~
30.0000 mL | Freq: Once | ORAL | Status: AC
  Administered 2017-04-01: 30 mL via ORAL
  Filled 2017-04-01: qty 30

## 2017-04-01 MED ORDER — DOCUSATE SODIUM 100 MG PO CAPS
100.0000 mg | ORAL_CAPSULE | Freq: Two times a day (BID) | ORAL | Status: DC
Start: 2017-04-01 — End: 2017-04-02
  Administered 2017-04-01 – 2017-04-02 (×2): 100 mg via ORAL
  Filled 2017-04-01 (×2): qty 1

## 2017-04-01 MED ORDER — DICYCLOMINE HCL 10 MG PO CAPS
10.0000 mg | ORAL_CAPSULE | Freq: Once | ORAL | Status: AC
  Administered 2017-04-01: 10 mg via ORAL
  Filled 2017-04-01: qty 1

## 2017-04-01 MED ORDER — ACETAMINOPHEN 650 MG RE SUPP: 650.0000 mg | Freq: Four times a day (QID) | RECTAL | Status: DC | PRN

## 2017-04-01 MED ORDER — ACETAMINOPHEN 325 MG PO TABS
650.0000 mg | ORAL_TABLET | Freq: Four times a day (QID) | ORAL | Status: DC | PRN
  Administered 2017-04-01: 650 mg via ORAL
  Filled 2017-04-01 (×2): qty 2

## 2017-04-01 MED ORDER — INSULIN ASPART 100 UNIT/ML ~~LOC~~ SOLN
0.0000 [IU] | Freq: Three times a day (TID) | SUBCUTANEOUS | Status: DC
  Administered 2017-04-02: 1 [IU] via SUBCUTANEOUS

## 2017-04-01 MED ORDER — POTASSIUM CHLORIDE IN NACL 20-0.9 MEQ/L-% IV SOLN
INTRAVENOUS | Status: DC
  Administered 2017-04-01 – 2017-04-02 (×3): via INTRAVENOUS
  Filled 2017-04-01 (×5): qty 1000

## 2017-04-01 MED ORDER — ONDANSETRON 4 MG PO TBDP: 4.0000 mg | ORAL_TABLET | Freq: Once | ORAL | Status: DC | PRN

## 2017-04-01 MED ORDER — ENOXAPARIN SODIUM 40 MG/0.4ML ~~LOC~~ SOLN
40.0000 mg | SUBCUTANEOUS | Status: DC
  Administered 2017-04-02: 40 mg via SUBCUTANEOUS
  Filled 2017-04-01: qty 0.4

## 2017-04-01 MED ORDER — KETOROLAC TROMETHAMINE 30 MG/ML IJ SOLN
30.0000 mg | Freq: Once | INTRAMUSCULAR | Status: AC
  Administered 2017-04-01: 30 mg via INTRAVENOUS
  Filled 2017-04-01: qty 1

## 2017-04-01 MED ORDER — MAGNESIUM CITRATE PO SOLN: 1.0000 | Freq: Once | ORAL | Status: DC | PRN

## 2017-04-01 MED ORDER — METOCLOPRAMIDE HCL 5 MG/ML IJ SOLN
10.0000 mg | Freq: Four times a day (QID) | INTRAMUSCULAR | Status: DC
  Administered 2017-04-01 – 2017-04-02 (×4): 10 mg via INTRAVENOUS
  Filled 2017-04-01 (×4): qty 2

## 2017-04-01 MED ORDER — POLYETHYLENE GLYCOL 3350 17 G PO PACK: 17.0000 g | PACK | Freq: Every day | ORAL | Status: DC | PRN

## 2017-04-01 MED ORDER — HYDRALAZINE HCL 20 MG/ML IJ SOLN
10.0000 mg | Freq: Once | INTRAMUSCULAR | Status: AC
  Administered 2017-04-01: 10 mg via INTRAVENOUS
  Filled 2017-04-01: qty 1

## 2017-04-01 MED ORDER — HYDRALAZINE HCL 20 MG/ML IJ SOLN
10.0000 mg | INTRAMUSCULAR | Status: DC | PRN
  Filled 2017-04-01: qty 0.5

## 2017-04-01 NOTE — H&P (Signed)
History and Physical    Richard Lopez KCM:[id number] DOB: 09-08-1976 DOA: 04/01/2017  Referring MD/NP/PA: Antonietta Breach PCP: Patient, No Pcp Per and no insurance Patient coming from: home  Chief Complaint: nausea, vomiting, and abdominal pain  HPI: Richard Lopez is a 41 y.o. male with history of obesity, hypertension, diabetes mellitus type 2, daily marijuana use, and gout who does not have a PCP and is unsure of his medical history who presents with nausea, vomiting, and abdominal pain.  He has had about 5 previous episodes similar to this one.  About 2 weeks ago, he developed nausea, vomiting and intermittent epigastric pain 8/10 without radiation.  Pain was worsened by eating and made somewhat better by ibuprofen 400-657m once daily and occasional doses of prilosec.  He has also been smoking pot almost every day.  He denied fevers except for sweating after vomiting.  His stools have been mostly formed except for two episodes of loose stools a few days ago, normal since.  His pain has become more frequent and vomiting became intractable over the last 24-48 hours and he was unable to tolerate even sips of liquids so he came to the ER.    ED Course: Vital signs notable for mildly elevated blood pressures 172/99.  Blood sugars marginally elevated 168. WBC 7.6. Urinalysis negative. LFTs with minimally elevated AST 42, total bilirubin 1.5. Abdominal ultrasound obtained in the emergency department demonstrated cholelithiasis with an otherwise unremarkable gallbladder and diffuse fatty infiltration of the liver.  He received a GI cocktail, Bentyl, Toradol, Reglan, Protonix in the emergency department but continued to have refractory/intractable nausea and vomiting is being admitted for further treatment and observation.  Review of Systems:  General:  Subjective fevers, no chills, weight loss or gain HEENT:  Denies changes to hearing and vision, rhinorrhea, sinus congestion, sore throat CV:  Denies chest  pain and palpitations, lower extremity edema.  PULM:  Denies SOB, wheezing, cough.   GI:  Per HPI GU:  Denies dysuria, frequency, urgency ENDO:  Denies polyuria, polydipsia.   HEME:  Denies hematemesis, blood in stools, melena, abnormal bruising or bleeding.  LYMPH:  Denies lymphadenopathy.   MSK:  Denies arthralgias, myalgias.   DERM:  Denies skin rash or ulcer.   NEURO:  Denies focal numbness, weakness, slurred speech, confusion, facial droop.  PSYCH:  Denies anxiety and depression.    Past Medical History:  Diagnosis Date  . Diabetes mellitus   . Essential hypertension   . Gastritis   . Gout     Past Surgical History:  Procedure Laterality Date  . NO PAST SURGERIES       reports that he has been smoking Cigarettes.  He has been smoking about 0.00 packs per day. He has never used smokeless tobacco. He reports that he drinks alcohol. He reports that he uses drugs, including Marijuana.  No Known Allergies  Family History  Problem Relation Age of Onset  . Diabetes Mellitus II Father   . Diabetes Mellitus II Other   . CAD Other     Prior to Admission medications   Medication Sig Start Date End Date Taking? Authorizing Provider  ibuprofen (ADVIL,MOTRIN) 200 MG tablet Take 400 mg by mouth 2 (two) times daily as needed for moderate pain.   Yes [provider]  omeprazole (PRILOSEC OTC) 20 MG tablet Take 20 mg by mouth daily as needed (heartburn).   Yes [provider]  amLODipine (NORVASC) 10 MG tablet Take 1 tablet (10 mg total)  by mouth daily. Patient not taking: Reported on 03/21/2015 07/31/14   Kelvin Cellar, MD  aspirin EC 81 MG tablet Take 1 tablet (81 mg total) by mouth daily. Patient not taking: Reported on 03/21/2015 07/31/14   Kelvin Cellar, MD  colchicine 0.6 MG tablet Take 1 tablet (0.6 mg total) by mouth daily. Patient not taking: Reported on 03/21/2015 07/31/14   Kelvin Cellar, MD  indomethacin (INDOCIN) 25 MG capsule Take 1 capsule (25 mg  total) by mouth 3 (three) times daily as needed. Patient not taking: Reported on 04/01/2017 03/21/15   Pamella Pert, MD  oxyCODONE-acetaminophen (PERCOCET) 5-325 MG per tablet Take 1-2 tablets by mouth every 6 (six) hours as needed for moderate pain. Patient not taking: Reported on 04/01/2017 03/21/15   Pamella Pert, MD  potassium chloride (K-DUR) 10 MEQ tablet Take 2 tablets (20 mEq total) by mouth daily. Patient not taking: Reported on 03/21/2015 07/31/14   Kelvin Cellar, MD    Physical Exam: Vitals:   04/01/17 0500 04/01/17 0530 04/01/17 0603 04/01/17 0702  BP: (!) 151/91 (!) 153/83 (!) 172/99 (!) 156/86  Pulse: 69 (!) 50 66   Resp:   16   Temp:      TempSrc:      SpO2: 100% 100% 99%    Constitutional: NAD, restless, hiccoughing frequently and bending over trash can to vomit Eyes: PERRL, lids and conjunctivae normal ENMT: Mucous membranes are moist. Posterior pharynx clear of any exudate or lesions.Normal dentition.  Neck: normal, supple, no masses, no thyromegaly Respiratory: clear to auscultation bilaterally, no wheezing, no crackles. Normal respiratory effort. No accessory muscle use.  Cardiovascular: Regular rate and rhythm, no murmurs / rubs / gallops. No extremity edema. 2+ pedal pulses. No carotid bruits.  Abdomen: Normal active bowel sounds, soft, nondistended, minimal soreness in the epigastric area without rebound or guarding.  Negative Murphy's sign. Musculoskeletal: no clubbing / cyanosis. No joint deformity upper and lower extremities. Good ROM, no contractures. Normal muscle tone.  Skin: no rashes, lesions, ulcers. No induration Neurologic: CN 2-12 grossly intact. Sensation intact, DTR normal. Strength 5/5 in all 4.  Psychiatric: Normal judgment and insight. Alert and oriented x 3. Normal mood.   Labs on Admission: I have personally reviewed following labs and imaging studies  CBC:  Recent Labs Lab 04/01/17 0133  WBC 7.6  HGB 14.1  HCT 40.6  MCV 87.5    PLT 834   Basic Metabolic Panel:  Recent Labs Lab 04/01/17 0133  NA 138  K 3.4*  CL 97*  CO2 25  GLUCOSE 168*  BUN 9  CREATININE 0.83  CALCIUM 9.3   GFR: CrCl cannot be calculated (Unknown ideal weight.). Liver Function Tests:  Recent Labs Lab 04/01/17 0133  AST 42*  ALT 25  ALKPHOS 61  BILITOT 1.5*  PROT 8.2*  ALBUMIN 4.2    Recent Labs Lab 04/01/17 0133  LIPASE 22   No results for input(s): AMMONIA in the last 168 hours. Coagulation Profile: No results for input(s): INR, PROTIME in the last 168 hours. Cardiac Enzymes: No results for input(s): CKTOTAL, CKMB, CKMBINDEX, TROPONINI in the last 168 hours. BNP (last 3 results) No results for input(s): PROBNP in the last 8760 hours. HbA1C: No results for input(s): HGBA1C in the last 72 hours. CBG: No results for input(s): GLUCAP in the last 168 hours. Lipid Profile: No results for input(s): CHOL, HDL, LDLCALC, TRIG, CHOLHDL, LDLDIRECT in the last 72 hours. Thyroid Function Tests: No results for input(s): TSH, T4TOTAL, FREET4, T3FREE,  THYROIDAB in the last 72 hours. Anemia Panel: No results for input(s): VITAMINB12, FOLATE, FERRITIN, TIBC, IRON, RETICCTPCT in the last 72 hours. Urine analysis:    Component Value Date/Time   COLORURINE AMBER (A) 04/01/2017 0541   APPEARANCEUR CLEAR 04/01/2017 0541   LABSPEC 1.020 04/01/2017 0541   PHURINE 6.0 04/01/2017 0541   GLUCOSEU NEGATIVE 04/01/2017 0541   HGBUR NEGATIVE 04/01/2017 0541   BILIRUBINUR NEGATIVE 04/01/2017 0541   KETONESUR 20 (A) 04/01/2017 0541   PROTEINUR 100 (A) 04/01/2017 0541   UROBILINOGEN 1.0 07/29/2014 1837   NITRITE NEGATIVE 04/01/2017 0541   LEUKOCYTESUR SMALL (A) 04/01/2017 0541   Sepsis Labs: '@LABRCNTIP' (procalcitonin:4,lacticidven:4) )No results found for this or any previous visit (from the past 240 hour(s)).   Radiological Exams on Admission: US Abdomen Complete  Result Date: 04/01/2017 CLINICAL DATA:  Acute onset of generalized  abdominal pain and vomiting. Initial encounter. EXAM: ABDOMEN ULTRASOUND COMPLETE COMPARISON:  CT of the abdomen and pelvis performed 05/03/2010, and renal ultrasound performed 07/30/2014 FINDINGS: Gallbladder: Stones are noted at the fundus of the gallbladder, measuring up to 8 mm in size. No gallbladder wall thickening or pericholecystic fluid is seen. No ultrasonographic Murphy's sign is elicited. Common bile duct: Diameter: 0.5 cm, within normal limits in caliber. Liver: No focal lesion identified. Diffusely increased parenchymal echogenicity and coarsened echotexture, compatible with fatty infiltration. IVC: No abnormality visualized. Pancreas: Visualized portion unremarkable. Spleen: Size and appearance within normal limits. Right Kidney: Length: 12.1 cm. Echogenicity within normal limits. No mass or hydronephrosis visualized. Left Kidney: Length: 12.1 cm. Echogenicity within normal limits. No mass or hydronephrosis visualized. Abdominal aorta: No aneurysm visualized. Other findings: None. IMPRESSION: 1. No acute abnormality seen within the abdomen. 2. Cholelithiasis.  Gallbladder otherwise unremarkable. 3. Diffuse fatty infiltration within the liver. Electronically Signed   By: Garald Balding M.D.   On: 04/01/2017 04:38    EKG: Independently reviewed.  NSR with sinus arrhythmia, no evidence of heart block or acute ischemia.    Assessment/Plan Principal Problem:   Intractable nausea and vomiting Active Problems:   Diabetes mellitus type 2 in obese [facility])   Essential hypertension   Marijuana abuse   Intractable nausea and vomiting.  LFTs are consistent with fatty liver disease and mild dehydration.  Lipase was normal.  His WBC is normal, he is afebrile, and not having acute diarrhea.  I suspect his symptoms may be due to his THC use.  Alternatively, he may have some gastroparesis.  His A1c has been elevated since 2010 and he has not been on medications for diabetes.  There is no recent A1c in the  system and he has no PCP. -  IVF -  CLD and advance as tolerated -  Check UDS and ETOH level -  Schedule reglan -  PRN zofran -  Protonix 33m IV BID -  Hold NSAIDs given history of gastritis  Essential hypertension -  Resume norvasc -  Hydralazine prn  Diabetes mellitus type 2 -  No recent A1c, check A1c -  Start low dose SSI  Gout, stable, not on medications  Elevated total protein, may be due to dehydration -  Check HIV, HCV. Doubt multiple myeloma  DVT prophylaxis: lovenox  Code Status: full Family Communication: patient alone  Disposition Plan: home in 1-2 days   Consults called: none  Admission status: observation   SJanece CanterburyMD [facility] Pager [phone] If 7PM-7AM, please contact night-coverage [email or web address] Password [email or web address]  04/01/2017, 8:03 AM

## 2017-04-01 NOTE — ED Notes (Signed)
US at bedside

## 2017-04-01 NOTE — Progress Notes (Signed)
Spoke with patient at bedside. He had been enrolled in the orange card program previously but it expired and he did not renew. He currently lives at his mothers house, was working until recently when employment ended. He states he does not take any medications at home. He has some limited financial resources. He does not drive but uses family, friends, bus to get around. He had plans to get PCP through [facility]) but has not followed up. Provided resources for reenstating the orange care and contact information for obtaining a PCP, including [facility]. Will continue to follow for d/c needs.

## 2017-04-01 NOTE — ED Triage Notes (Signed)
Pt has been having emesis today, has been having abdominal pain off and on for 3 weeks. Denies diarrhea. Unable to hold fluids down.

## 2017-04-01 NOTE — ED Provider Notes (Signed)
[facility]-EMERGENCY DEPT Provider Note   CSN: [id number] Arrival date & time: 04/01/17  0049    History   Chief Complaint Chief Complaint  Patient presents with  . Emesis  . Abdominal Pain    HPI Richard Lopez is a 41 y.o. male.  40 year old male with history of gastritis, diabetes, and gout presents to the emergency department for abdominal pain. He reports intermittent abdominal pain over the past 3 weeks. This has been worse recently and aggravated with eating. Patient describes the pain as a burning discomfort. He has been unable to tolerate food or fluids today secondary to pain and vomiting. Emesis has been bilious in nature. No blood noted. Patient reports normal bowel movements and passing of flatus. No associated fevers, urinary symptoms, or history of abdominal surgeries. Patient states that his symptoms feel similar to when he was diagnosed with gastritis.      Past Medical History:  Diagnosis Date  . Diabetes mellitus   . Gastritis   . Gout     Patient Active Problem List   Diagnosis Date Noted  . ARF (acute renal failure) (HCC) 07/29/2014  . Nausea with vomiting 07/29/2014  . Hyperglycemia 07/29/2014  . Elevated blood pressure 07/29/2014  . Nausea & vomiting 07/29/2014    Past Surgical History:  Procedure Laterality Date  . NO PAST SURGERIES         Home Medications    Prior to Admission medications   Medication Sig Start Date End Date Taking? Authorizing Provider  ibuprofen (ADVIL,MOTRIN) 200 MG tablet Take 400 mg by mouth 2 (two) times daily as needed for moderate pain.   Yes [provider]  omeprazole (PRILOSEC OTC) 20 MG tablet Take 20 mg by mouth daily as needed (heartburn).   Yes [provider]  amLODipine (NORVASC) 10 MG tablet Take 1 tablet (10 mg total) by mouth daily. Patient not taking: Reported on 03/21/2015 07/31/14   Jeralyn Bennett, MD  aspirin EC 81 MG tablet Take 1 tablet (81 mg total) by mouth daily. Patient not  taking: Reported on 03/21/2015 07/31/14   Jeralyn Bennett, MD  colchicine 0.6 MG tablet Take 1 tablet (0.6 mg total) by mouth daily. Patient not taking: Reported on 03/21/2015 07/31/14   Jeralyn Bennett, MD  indomethacin (INDOCIN) 25 MG capsule Take 1 capsule (25 mg total) by mouth 3 (three) times daily as needed. Patient not taking: Reported on 04/01/2017 03/21/15   Purvis Sheffield, MD  oxyCODONE-acetaminophen (PERCOCET) 5-325 MG per tablet Take 1-2 tablets by mouth every 6 (six) hours as needed for moderate pain. Patient not taking: Reported on 04/01/2017 03/21/15   Purvis Sheffield, MD  potassium chloride (K-DUR) 10 MEQ tablet Take 2 tablets (20 mEq total) by mouth daily. Patient not taking: Reported on 03/21/2015 07/31/14   Jeralyn Bennett, MD    Family History Family History  Problem Relation Age of Onset  . Diabetes Mellitus II Father   . Diabetes Mellitus II Other   . CAD Other     Social History Social History  Substance Use Topics  . Smoking status: Current Every Day Smoker    Packs/day: 0.00    Types: Cigarettes  . Smokeless tobacco: Never Used  . Alcohol use Yes     Allergies   Patient has no known allergies.   Review of Systems Review of Systems Ten systems reviewed and are negative for acute change, except as noted in the HPI.    Physical Exam Updated Vital Signs BP (!) 156/86  Pulse 66   Temp 98.1 F (36.7 C) (Oral)   Resp 16   SpO2 99%   Physical Exam  Constitutional: He is oriented to person, place, and time. He appears well-developed and well-nourished. No distress.  Mildly diaphoretic; dry heaves.  HENT:  Head: Normocephalic and atraumatic.  Eyes: Conjunctivae and EOM are normal. No scleral icterus.  Neck: Normal range of motion.  Cardiovascular: Normal rate, regular rhythm and intact distal pulses.   Pulmonary/Chest: Effort normal. No respiratory distress. He has no wheezes. He has no rales.  Respirations even and unlabored  Abdominal: Soft.  He exhibits no distension and no mass. There is tenderness. There is no guarding.  Soft, nondistended abdomen. Bilateral upper abdominal tenderness. Epigastric TTP noted as well. No peritoneal signs or guarding.  Musculoskeletal: Normal range of motion.  Neurological: He is alert and oriented to person, place, and time. He exhibits normal muscle tone. Coordination normal.  Skin: Skin is warm and dry. No rash noted. He is not diaphoretic. No erythema. No pallor.  Psychiatric: He has a normal mood and affect. His behavior is normal.  Nursing note and vitals reviewed.    ED Treatments / Results  Labs (all labs ordered are listed, but only abnormal results are displayed) Labs Reviewed  COMPREHENSIVE METABOLIC PANEL - Abnormal; Notable for the following:       Result Value   Potassium 3.4 (*)    Chloride 97 (*)    Glucose, Bld 168 (*)    Total Protein 8.2 (*)    AST 42 (*)    Total Bilirubin 1.5 (*)    Anion gap 16 (*)    All other components within normal limits  CBC - Abnormal; Notable for the following:    RDW 16.1 (*)    All other components within normal limits  URINALYSIS, ROUTINE W REFLEX MICROSCOPIC - Abnormal; Notable for the following:    Color, Urine AMBER (*)    Ketones, ur 20 (*)    Protein, ur 100 (*)    Leukocytes, UA SMALL (*)    Bacteria, UA RARE (*)    Squamous Epithelial / LPF 0-5 (*)    All other components within normal limits  LIPASE, BLOOD    EKG  EKG Interpretation None       Radiology US Abdomen Complete  Result Date: 04/01/2017 CLINICAL DATA:  Acute onset of generalized abdominal pain and vomiting. Initial encounter. EXAM: ABDOMEN ULTRASOUND COMPLETE COMPARISON:  CT of the abdomen and pelvis performed 05/03/2010, and renal ultrasound performed 07/30/2014 FINDINGS: Gallbladder: Stones are noted at the fundus of the gallbladder, measuring up to 8 mm in size. No gallbladder wall thickening or pericholecystic fluid is seen. No ultrasonographic Murphy's  sign is elicited. Common bile duct: Diameter: 0.5 cm, within normal limits in caliber. Liver: No focal lesion identified. Diffusely increased parenchymal echogenicity and coarsened echotexture, compatible with fatty infiltration. IVC: No abnormality visualized. Pancreas: Visualized portion unremarkable. Spleen: Size and appearance within normal limits. Right Kidney: Length: 12.1 cm. Echogenicity within normal limits. No mass or hydronephrosis visualized. Left Kidney: Length: 12.1 cm. Echogenicity within normal limits. No mass or hydronephrosis visualized. Abdominal aorta: No aneurysm visualized. Other findings: None. IMPRESSION: 1. No acute abnormality seen within the abdomen. 2. Cholelithiasis.  Gallbladder otherwise unremarkable. 3. Diffuse fatty infiltration within the liver. Electronically Signed   By: Roanna Raider M.D.   On: 04/01/2017 04:38    Procedures Procedures (including critical care time)  Medications Ordered in ED Medications  ondansetron (  ZOFRAN-ODT) disintegrating tablet 4 mg (4 mg Oral Given 04/01/17 0118)  metoCLOPramide (REGLAN) injection 10 mg (10 mg Intravenous Given 04/01/17 0431)  sodium chloride 0.9 % bolus 1,000 mL (0 mLs Intravenous Stopped 04/01/17 0600)  pantoprazole (PROTONIX) injection 40 mg (40 mg Intravenous Given 04/01/17 0431)  dicyclomine (BENTYL) capsule 10 mg (10 mg Oral Given 04/01/17 0431)  gi cocktail (Maalox,Lidocaine,Donnatal) (30 mLs Oral Given 04/01/17 0601)  ketorolac (TORADOL) 30 MG/ML injection 30 mg (30 mg Intravenous Given 04/01/17 0626)  hydrALAZINE (APRESOLINE) injection 10 mg (10 mg Intravenous Given 04/01/17 0702)  promethazine (PHENERGAN) injection 12.5 mg (12.5 mg Intravenous Given 04/01/17 0703)  sodium chloride 0.9 % bolus 1,000 mL (1,000 mLs Intravenous New Bag/Given 04/01/17 0702)     Initial Impression / Assessment and Plan / ED Course  I have reviewed the triage vital signs and the nursing notes.  Pertinent labs & imaging results that were  available during my care of the patient were reviewed by me and considered in my medical decision making (see chart for details).     41 year old male presents to the emergency department for persistent upper abdominal pain as well as nausea and vomiting. He reports inability to tolerate food or fluids by mouth over the past 24 hours secondary to emesis. Asian with no peritoneal signs on abdominal exam. There is tenderness in the bilateral upper quadrants and epigastrium. Laboratory workup reassuring, nonspecific. Given location of pain, ultrasound ordered. This shows cobblestones without evidence of acute cholecystitis.  Extensive medical management attempted in the emergency department. Patient continues to complain of pain and nausea. The patient states that he does not feel comfortable managing his symptoms further at home. [facility] consulted for admission. Should symptoms persist, the patient may benefit from HIDA scan and/or EGD. Case discussed with Dr. Malachi Bonds of [facility] who will admit.   Final Clinical Impressions(s) / ED Diagnoses   Final diagnoses:  Pain of upper abdomen  Intractable vomiting with nausea, unspecified vomiting type    New Prescriptions New Prescriptions   No medications on file     Antony Madura, PA-C 04/01/17 0160    Shon Baton, MD 04/04/17 [phone]

## 2017-04-02 DIAGNOSIS — F1210 Cannabis abuse, uncomplicated: Secondary | ICD-10-CM

## 2017-04-02 DIAGNOSIS — R112 Nausea with vomiting, unspecified: Secondary | ICD-10-CM

## 2017-04-02 DIAGNOSIS — K8020 Calculus of gallbladder without cholecystitis without obstruction: Secondary | ICD-10-CM

## 2017-04-02 DIAGNOSIS — R7303 Prediabetes: Secondary | ICD-10-CM

## 2017-04-02 DIAGNOSIS — K2970 Gastritis, unspecified, without bleeding: Secondary | ICD-10-CM

## 2017-04-02 DIAGNOSIS — K760 Fatty (change of) liver, not elsewhere classified: Secondary | ICD-10-CM

## 2017-04-02 DIAGNOSIS — K2900 Acute gastritis without bleeding: Secondary | ICD-10-CM

## 2017-04-02 LAB — COMPREHENSIVE METABOLIC PANEL
ALT: 20 U/L (ref 17–63)
ANION GAP: 11 (ref 5–15)
AST: 32 U/L (ref 15–41)
Albumin: 3.5 g/dL (ref 3.5–5.0)
Alkaline Phosphatase: 50 U/L (ref 38–126)
BUN: 7 mg/dL (ref 6–20)
CHLORIDE: 100 mmol/L — AB (ref 101–111)
CO2: 26 mmol/L (ref 22–32)
CREATININE: 0.83 mg/dL (ref 0.61–1.24)
Calcium: 8.3 mg/dL — ABNORMAL LOW (ref 8.9–10.3)
GFR calc non Af Amer: >60 mL/min (ref >60)
Glucose, Bld: 96 mg/dL (ref 65–99)
POTASSIUM: 3.3 mmol/L — AB (ref 3.5–5.1)
SODIUM: 137 mmol/L (ref 135–145)
Total Bilirubin: 1.4 mg/dL — ABNORMAL HIGH (ref 0.3–1.2)
Total Protein: 7.1 g/dL (ref 6.5–8.1)

## 2017-04-02 LAB — HEMOGLOBIN A1C
Hgb A1c MFr Bld: 5.7 % — ABNORMAL HIGH (ref 4.8–5.6)
Mean Plasma Glucose: 117 mg/dL

## 2017-04-02 LAB — CBC
HCT: 40 % (ref 39.0–52.0)
HEMOGLOBIN: 13.9 g/dL (ref 13.0–17.0)
MCH: 30.5 pg (ref 26.0–34.0)
MCHC: 34.8 g/dL (ref 30.0–36.0)
MCV: 87.9 fL (ref 78.0–100.0)
Platelets: 148 10*3/uL — ABNORMAL LOW (ref 150–400)
RBC: 4.55 MIL/uL (ref 4.22–5.81)
RDW: 16.1 % — ABNORMAL HIGH (ref 11.5–15.5)
WBC: 5.4 10*3/uL (ref 4.0–10.5)

## 2017-04-02 LAB — GLUCOSE, CAPILLARY
Glucose-Capillary: 114 mg/dL — ABNORMAL HIGH (ref 65–99)
Glucose-Capillary: 148 mg/dL — ABNORMAL HIGH (ref 65–99)

## 2017-04-02 LAB — MAGNESIUM: Magnesium: 1.3 mg/dL — ABNORMAL LOW (ref 1.7–2.4)

## 2017-04-02 LAB — HIV ANTIBODY (ROUTINE TESTING W REFLEX): HIV Screen 4th Generation wRfx: NONREACTIVE

## 2017-04-02 LAB — HEPATITIS C ANTIBODY: HCV AB: 0.1 {s_co_ratio} (ref 0.0–0.9)

## 2017-04-02 MED ORDER — POTASSIUM CHLORIDE 20 MEQ/15ML (10%) PO SOLN
40.0000 meq | Freq: Once | ORAL | Status: AC
  Administered 2017-04-02: 40 meq via ORAL
  Filled 2017-04-02: qty 30

## 2017-04-02 MED ORDER — MAGNESIUM SULFATE 2 GM/50ML IV SOLN
2.0000 g | Freq: Once | INTRAVENOUS | Status: AC
  Administered 2017-04-02: 2 g via INTRAVENOUS
  Filled 2017-04-02: qty 50

## 2017-04-02 MED ORDER — OMEPRAZOLE MAGNESIUM 20 MG PO TBEC: 20.0000 mg | DELAYED_RELEASE_TABLET | Freq: Two times a day (BID) | ORAL | Status: DC

## 2017-04-02 MED ORDER — FAMOTIDINE 20 MG PO TABS: 20.0000 mg | ORAL_TABLET | Freq: Two times a day (BID) | ORAL | Status: DC | PRN

## 2017-04-02 NOTE — Discharge Instructions (Signed)
Cholelithiasis Cholelithiasis is also called "gallstones." It is a kind of gallbladder disease. The gallbladder is an organ that stores a liquid (bile) that helps you digest fat. Gallstones may not cause symptoms (may be silent gallstones) until they cause a blockage, and then they can cause pain (gallbladder attack). Follow these instructions at home:  Take over-the-counter and prescription medicines only as told by your doctor.  Stay at a healthy weight.  Eat healthy foods. This includes: ? Eating fewer fatty foods, like fried foods. ? Eating fewer refined carbs (refined carbohydrates). Refined carbs are breads and grains that are highly processed, like white bread and white rice. Instead, choose whole grains like whole-wheat bread and brown rice. ? Eating more fiber. Almonds, fresh fruit, and beans are healthy sources of fiber.  Keep all follow-up visits as told by your doctor. This is important. Contact a doctor if:  You have sudden pain in the upper right side of your belly (abdomen). Pain might spread to your right shoulder or your chest. This may be a sign of a gallbladder attack.  You feel sick to your stomach (are nauseous).  You throw up (vomit).  You have been diagnosed with gallstones that have no symptoms and you get: ? Belly pain. ? Discomfort, burning, or fullness in the upper part of your belly (indigestion). Get help right away if:  You have sudden pain in the upper right side of your belly, and it lasts for more than 2 hours.  You have belly pain that lasts for more than 5 hours.  You have a fever or chills.  You keep feeling sick to your stomach or you keep throwing up.  Your skin or the whites of your eyes turn yellow (jaundice).  You have dark-colored pee (urine).  You have light-colored poop (stool). Summary  Cholelithiasis is also called "gallstones."  The gallbladder is an organ that stores a liquid (bile) that helps you digest fat.  Silent  gallstones are gallstones that do not cause symptoms.  A gallbladder attack may cause sudden pain in the upper right side of your belly. Pain might spread to your right shoulder or your chest. If this happens, contact your doctor.  If you have sudden pain in the upper right side of your belly that lasts for more than 2 hours, get help right away. This information is not intended to replace advice given to you by your health care provider. Make sure you discuss any questions you have with your health care provider. Document Released: 04/07/2008 Document Revised: 07/06/2016 Document Reviewed: 07/06/2016 Elsevier Interactive Patient Education  2017 [employer] Fatty Liver Fatty liver, also called hepatic steatosis or steatohepatitis, is a condition in which too much fat has built up in your liver cells. The liver removes harmful substances from your bloodstream. It produces fluids your body needs. It also helps your body use and store energy from the food you eat. In many cases, fatty liver does not cause symptoms or problems. It is often diagnosed when tests are being done for other reasons. However, over time, fatty liver can cause inflammation that may lead to more serious liver problems, such as scarring of the liver (cirrhosis). What are the causes? Causes of fatty liver may include:  Drinking too much alcohol.  Poor nutrition.  Obesity.  Cushing syndrome.  Diabetes.  Hyperlipidemia.  Pregnancy.  Certain drugs.  Poisons.  Some viral infections.  What increases the risk? You may be more likely to develop fatty liver if you:  Abuse alcohol.  Are pregnant.  Are overweight.  Have diabetes.  Have hepatitis.  Have a high triglyceride level.  What are the signs or symptoms? Fatty liver often does not cause any symptoms. In cases where symptoms develop, they can include:  Fatigue.  Weakness.  Weight loss.  Confusion.  Abdominal pain.  Yellowing of your skin  and the white parts of your eyes (jaundice).  Nausea and vomiting.  How is this diagnosed? Fatty liver may be diagnosed by:  Physical exam and medical history.  Blood tests.  Imaging tests, such as an ultrasound, CT scan, or MRI.  Liver biopsy. A small sample of liver tissue is removed using a needle. The sample is then looked at under a microscope.  How is this treated? Fatty liver is often caused by other health conditions. Treatment for fatty liver may involve medicines and lifestyle changes to manage conditions such as:  Alcoholism.  High cholesterol.  Diabetes.  Being overweight or obese.  Follow these instructions at home:  Eat a healthy diet as directed by your health care provider.  Exercise regularly. This can help you lose weight and control your cholesterol and diabetes. Talk to your health care provider about an exercise plan and which activities are best for you.  Do not drink alcohol.  Take medicines only as directed by your health care provider. Contact a health care provider if: You have difficulty controlling your:  Blood sugar.  Cholesterol.  Alcohol consumption.  Get help right away if:  You have abdominal pain.  You have jaundice.  You have nausea and vomiting. This information is not intended to replace advice given to you by your health care provider. Make sure you discuss any questions you have with your health care provider. Document Released: 12/05/2005 Document Revised: 03/27/2016 Document Reviewed: 03/01/2014 Elsevier Interactive Patient Education  [employer].

## 2017-04-02 NOTE — Progress Notes (Signed)
Pt tolerated his lunch with no complaints of pain. Says he feels fine.  BP was elevated and MD was notified. He is to follow up with [facility] after discharge.  D/C instructions were given and understanding was verbalized.

## 2017-04-02 NOTE — Discharge Summary (Signed)
Physician Discharge Summary  Richard Lopez WUX:[id number] DOB: [birth year] DOA: 04/01/2017  PCP: Patient, No Pcp Per  Admit date: 04/01/2017 Discharge date: 04/02/2017  Admitted From: home Disposition:  home   Recommendations for Outpatient Follow-up:  1. Follow proteinuria   Home Health:  none  Equipment/Devices:  none    Discharge Condition:  stable   CODE STATUS:  Full code   Consultations:  none    Discharge Diagnoses:  Principal Problem:   Intractable nausea and vomiting Active Problems:   Gastritis   Essential hypertension   Marijuana abuse   Prediabetes    Subjective: Tolerating diet without symptoms of nausea or vomiting. Has mild discomfort in upper abdomen (epigastrium). No diarrhea. No fever or chills.   Brief Summary: Richard Lopez is a 41 y.o. male with history of obesity, hypertension, diabetes mellitus type 2, daily marijuana use, and gout who does not have a PCP and is unsure of his medical history who presents with nausea, vomiting, and abdominal pain.  He has had about 5 previous episodes similar to this one.  About 2 weeks ago, he developed nausea, vomiting and intermittent epigastric pain 8/10 without radiation.  Pain was worsened by eating and made somewhat better by ibuprofen 400-600mg  once daily and occasional doses of prilosec.  He has also been smoking pot almost every day.  He denied fevers except for sweating after vomiting.  His stools have been mostly formed except for two episodes of loose stools a few days ago, normal since.  His pain has become more frequent and vomiting became intractable over the last 24-48 hours and he was unable to tolerate even sips of liquids so he came to the ER.    Hospital Course:  Intractable epigastric pain with nausea and vomiting - h/o gastritis in the past which improved with PPI - will prescribed daily PPI, PRN H2 blockers and a recommend bland diet - advised to stop all NSAIDs for now - if symptoms do not resolve  with above treatment, will need a GI eval as outpt  Essential hypertension -  Resume norvasc -  Hydralazine prn  Hypomagnesemia and hypokalemia - replacing  Diabetes mellitus type 2? -  He states that he used to weight close to 400 lbs and has lost over 100 lbs - A1c is 5.7- explained that he had prediabetes which needs to be followed  Gout, stable, not on medications  Elevated total protein, may be due to dehydration -    HIV negative, HCV negative  Cannabis abuse  Cholelithiasis and Fatty liver - low fat diet and weight loss recommended   Discharge Instructions  Discharge Instructions    Diet - low sodium heart healthy    Complete by:  As directed    Discharge instructions    Complete by:  As directed    Avoid aspirin, Ibuprofen, Naprosyn as they can cause gastritis to worsen.  Take a bland easy to digest diet without caffeine or spices. It should be low fat.   Increase activity slowly    Complete by:  As directed      Allergies as of 04/02/2017   No Known Allergies     Medication List    TAKE these medications   famotidine 20 MG tablet Commonly known as:  PEPCID Take 1 tablet (20 mg total) by mouth 2 (two) times daily as needed for heartburn or indigestion.   omeprazole 20 MG tablet Commonly known as:  PRILOSEC OTC Take 1 tablet (20 mg  total) by mouth 2 (two) times daily. What changed:  when to take this  reasons to take this      Follow-up Information    [facility]. Schedule an appointment as soon as possible for a visit in 1 week(s).   Contact information: [address] [place] [address] [phone]         No Known Allergies   Procedures/Studies:    US Abdomen Complete  Result Date: 04/01/2017 CLINICAL DATA:  Acute onset of generalized abdominal pain and vomiting. Initial encounter. EXAM: ABDOMEN ULTRASOUND COMPLETE COMPARISON:  CT of the abdomen and pelvis performed  05/03/2010, and renal ultrasound performed 07/30/2014 FINDINGS: Gallbladder: Stones are noted at the fundus of the gallbladder, measuring up to 8 mm in size. No gallbladder wall thickening or pericholecystic fluid is seen. No ultrasonographic Murphy's sign is elicited. Common bile duct: Diameter: 0.5 cm, within normal limits in caliber. Liver: No focal lesion identified. Diffusely increased parenchymal echogenicity and coarsened echotexture, compatible with fatty infiltration. IVC: No abnormality visualized. Pancreas: Visualized portion unremarkable. Spleen: Size and appearance within normal limits. Right Kidney: Length: 12.1 cm. Echogenicity within normal limits. No mass or hydronephrosis visualized. Left Kidney: Length: 12.1 cm. Echogenicity within normal limits. No mass or hydronephrosis visualized. Abdominal aorta: No aneurysm visualized. Other findings: None. IMPRESSION: 1. No acute abnormality seen within the abdomen. 2. Cholelithiasis.  Gallbladder otherwise unremarkable. 3. Diffuse fatty infiltration within the liver. Electronically Signed   By: Roanna Raider M.D.   On: 04/01/2017 04:38       Discharge Exam: Vitals:   04/01/17 2142 04/02/17 0437  BP: 119/75 128/82  Pulse: 73 (!) 59  Resp: 18 18  Temp: 99.2 F (37.3 C) 98.3 F (36.8 C)   Vitals:   04/01/17 1400 04/01/17 2142 04/02/17 0300 04/02/17 0437  BP: (!) 157/78 119/75  128/82  Pulse: (!) 57 73  (!) 59  Resp: 18 18  18   Temp: 98.9 F (37.2 C) 99.2 F (37.3 C)  98.3 F (36.8 C)  TempSrc: Oral Oral  Oral  SpO2: 100% 100%  100%  Weight:   119.3 kg (263 lb 0.1 oz)   Height:   6\' 6"  (1.981 m)     General: Pt is alert, awake, not in acute distress Cardiovascular: RRR, S1/S2 +, no rubs, no gallops Respiratory: CTA bilaterally, no wheezing, no rhonchi Abdominal: Soft, NT, ND, bowel sounds + Extremities: no edema, no cyanosis    The results of significant diagnostics from this hospitalization (including imaging,  microbiology, ancillary and laboratory) are listed below for reference.     Microbiology: No results found for this or any previous visit (from the past 240 hour(s)).   Labs: BNP (last 3 results) No results for input(s): BNP in the last 8760 hours. Basic Metabolic Panel:  Recent Labs Lab 04/01/17 0133 04/02/17 0521  NA 138 137  K 3.4* 3.3*  CL 97* 100*  CO2 25 26  GLUCOSE 168* 96  BUN 9 7  CREATININE 0.83 0.83  CALCIUM 9.3 8.3*  MG  --  1.3*   Liver Function Tests:  Recent Labs Lab 04/01/17 0133 04/02/17 0521  AST 42* 32  ALT 25 20  ALKPHOS 61 50  BILITOT 1.5* 1.4*  PROT 8.2* 7.1  ALBUMIN 4.2 3.5    Recent Labs Lab 04/01/17 0133  LIPASE 22   No results for input(s): AMMONIA in the last 168 hours. CBC:  Recent Labs Lab 04/01/17 0133 04/02/17 4287  WBC 7.6 5.4  HGB 14.1 13.9  HCT 40.6 40.0  MCV 87.5 87.9  PLT 198 148*   Cardiac Enzymes: No results for input(s): CKTOTAL, CKMB, CKMBINDEX, TROPONINI in the last 168 hours. BNP: Invalid input(s): POCBNP CBG:  Recent Labs Lab 04/01/17 1223 04/01/17 1625 04/01/17 2134 04/02/17 0739 04/02/17 1132  GLUCAP 144* 99 148* 114* 148*   D-Dimer No results for input(s): DDIMER in the last 72 hours. Hgb A1c  Recent Labs  04/01/17 1003  HGBA1C 5.7*   Lipid Profile No results for input(s): CHOL, HDL, LDLCALC, TRIG, CHOLHDL, LDLDIRECT in the last 72 hours. Thyroid function studies  Recent Labs  04/01/17 1003  TSH 0.938   Anemia work up No results for input(s): VITAMINB12, FOLATE, FERRITIN, TIBC, IRON, RETICCTPCT in the last 72 hours. Urinalysis    Component Value Date/Time   COLORURINE AMBER (A) 04/01/2017 0541   APPEARANCEUR CLEAR 04/01/2017 0541   LABSPEC 1.020 04/01/2017 0541   PHURINE 6.0 04/01/2017 0541   GLUCOSEU NEGATIVE 04/01/2017 0541   HGBUR NEGATIVE 04/01/2017 0541   BILIRUBINUR NEGATIVE 04/01/2017 0541   KETONESUR 20 (A) 04/01/2017 0541   PROTEINUR 100 (A) 04/01/2017 0541    UROBILINOGEN 1.0 07/29/2014 1837   NITRITE NEGATIVE 04/01/2017 0541   LEUKOCYTESUR SMALL (A) 04/01/2017 0541   Sepsis Labs Invalid input(s): PROCALCITONIN,  WBC,  LACTICIDVEN Microbiology No results found for this or any previous visit (from the past 240 hour(s)).   Time coordinating discharge: Over 30 minutes  SIGNED:   Calvert Cantor, MD  [facility] 04/02/2017, 2:42 PM Pager   If 7PM-7AM, please contact night-coverage [email or web address] Password [email or web address]

## 2017-06-28 ENCOUNTER — Encounter (HOSPITAL_COMMUNITY): Payer: Self-pay | Admitting: Emergency Medicine

## 2017-06-28 DIAGNOSIS — E119 Type 2 diabetes mellitus without complications: Secondary | ICD-10-CM | POA: Insufficient documentation

## 2017-06-28 DIAGNOSIS — Z79899 Other long term (current) drug therapy: Secondary | ICD-10-CM | POA: Insufficient documentation

## 2017-06-28 DIAGNOSIS — H65191 Other acute nonsuppurative otitis media, right ear: Secondary | ICD-10-CM | POA: Insufficient documentation

## 2017-06-28 DIAGNOSIS — F17210 Nicotine dependence, cigarettes, uncomplicated: Secondary | ICD-10-CM | POA: Insufficient documentation

## 2017-06-28 DIAGNOSIS — I10 Essential (primary) hypertension: Secondary | ICD-10-CM | POA: Insufficient documentation

## 2017-06-28 DIAGNOSIS — H6591 Unspecified nonsuppurative otitis media, right ear: Secondary | ICD-10-CM | POA: Insufficient documentation

## 2017-06-28 DIAGNOSIS — R51 Headache: Secondary | ICD-10-CM | POA: Insufficient documentation

## 2017-06-28 NOTE — ED Notes (Signed)
Pt had pain start on the right side of his head, which has dissipated into tightness. Also, c/o tightness in his neck and pain/ringing in his right ear.

## 2017-06-28 NOTE — ED Triage Notes (Signed)
Pt reports having pain in right ear and head behind ear. Pt reports pain is throbbing intermittently. Pt states that ear pain occurred last night and feels full and ringing.

## 2017-06-29 ENCOUNTER — Emergency Department (HOSPITAL_COMMUNITY): Payer: Self-pay

## 2017-06-29 ENCOUNTER — Encounter (HOSPITAL_COMMUNITY): Payer: Self-pay | Admitting: Radiology

## 2017-06-29 ENCOUNTER — Emergency Department (HOSPITAL_COMMUNITY)
Admission: EM | Admit: 2017-06-29 | Discharge: 2017-06-29 | Disposition: A | Discharge status: Home / Self Care | Payer: Self-pay | Attending: Emergency Medicine | Admitting: Emergency Medicine

## 2017-06-29 DIAGNOSIS — R519 Headache, unspecified: Secondary | ICD-10-CM

## 2017-06-29 DIAGNOSIS — H6591 Unspecified nonsuppurative otitis media, right ear: Secondary | ICD-10-CM

## 2017-06-29 DIAGNOSIS — H65191 Other acute nonsuppurative otitis media, right ear: Secondary | ICD-10-CM

## 2017-06-29 DIAGNOSIS — R51 Headache: Secondary | ICD-10-CM

## 2017-06-29 MED ORDER — FLUTICASONE PROPIONATE 50 MCG/ACT NA SUSP: 2.0000 | Freq: Every day | NASAL | 2 refills | Status: DC

## 2017-06-29 MED ORDER — IBUPROFEN 800 MG PO TABS
800.0000 mg | ORAL_TABLET | Freq: Once | ORAL | Status: AC
  Administered 2017-06-29: 800 mg via ORAL
  Filled 2017-06-29: qty 1

## 2017-06-29 MED ORDER — OXYMETAZOLINE HCL 0.05 % NA SOLN
2.0000 | Freq: Once | NASAL | Status: AC
  Administered 2017-06-29: 2 via NASAL
  Filled 2017-06-29: qty 15

## 2017-06-29 NOTE — Discharge Instructions (Addendum)
1. Medications: flonase, usual home medications 2. Treatment: rest, drink plenty of fluids,  3. Follow Up: Please followup with your primary doctor and neurology in 2-3 days for discussion of your diagnoses and further evaluation after today's visit; if you do not have a primary care doctor use the resource guide provided to find one; Please return to the ER for return of headache, nausea, vomiting, fever, rash or other concerns

## 2017-06-29 NOTE — ED Provider Notes (Signed)
[facility]-EMERGENCY DEPT Provider Note   CSN: [id number] Arrival date & time: 06/28/17  2148     History   Chief Complaint Chief Complaint  Patient presents with  . Otalgia  . Headache    HPI Richard Lopez is a 41 y.o. male with a hx of NIDDM, HTN, gout presents to the Emergency Department complaining of acute onset headache between 8:30-9pm tonight.  Pt reports the headache began suddenly on the right temporal region and radiated to the back of his head and into the side of the right neck.  He denies fever, chills, neck stiffness. No aggravating or alleviating factors at that time.  He reports no known tick bites or rash.  Pt reports the pain has since improved and resolved while he was in the waiting room.  He reports concurrent right otalgia.  He reports no altercation prior.  Associated symptoms included approx 30 seconds of blurred vision at the onset of the headache that resolved almost immediately and has not returned.  Pt reports right otalgia that is persistent since before the onset of the headache.  No aggravating or alleviating factors at this time.  He denies fever, Chills, neck stiffness, chest pain, shortness of breath, abdominal pain, nausea, vomiting, diarrhea, weakness dizziness, syncope, near syncope.   Patient reports a family history of ruptured aneurysm in his mother.   The history is provided by the patient and medical records. No language interpreter was used.    Past Medical History:  Diagnosis Date  . Diabetes mellitus   . Essential hypertension   . Gastritis   . Gout     Patient Active Problem List   Diagnosis Date Noted  . Prediabetes 04/02/2017  . Gastritis 04/02/2017  . Intractable nausea and vomiting 04/01/2017  . Marijuana abuse 04/01/2017  . ARF (acute renal failure) (HCC) 07/29/2014  . Nausea with vomiting 07/29/2014  . Essential hypertension 07/29/2014  . Nausea & vomiting 07/29/2014    Past Surgical History:  Procedure Laterality Date  . NO  PAST SURGERIES         Home Medications    Prior to Admission medications   Medication Sig Start Date End Date Taking? Authorizing Provider  famotidine (PEPCID) 20 MG tablet Take 1 tablet (20 mg total) by mouth 2 (two) times daily as needed for heartburn or indigestion. 04/02/17   Calvert Cantor, MD  fluticasone (FLONASE) 50 MCG/ACT nasal spray Place 2 sprays into both nostrils daily. 06/29/17   Demyan Fugate, Dahlia Client, PA-C  omeprazole (PRILOSEC OTC) 20 MG tablet Take 1 tablet (20 mg total) by mouth 2 (two) times daily. 04/02/17   Calvert Cantor, MD    Family History Family History  Problem Relation Age of Onset  . Diabetes Mellitus II Father   . Diabetes Mellitus II Other   . CAD Other     Social History Social History  Substance Use Topics  . Smoking status: Current Every Day Smoker    Packs/day: 0.00    Types: Cigarettes  . Smokeless tobacco: Never Used  . Alcohol use Yes     Comment: 1-2 beers per day     Allergies   Patient has no known allergies.   Review of Systems Review of Systems  Constitutional: Negative for appetite change, diaphoresis, fatigue, fever and unexpected weight change.  HENT: Positive for ear pain. Negative for mouth sores.   Eyes: Positive for visual disturbance ( brief, resolved).  Respiratory: Negative for cough, chest tightness, shortness of breath and wheezing.  Cardiovascular: Negative for chest pain.  Gastrointestinal: Negative for abdominal pain, constipation, diarrhea, nausea and vomiting.  Endocrine: Negative for polydipsia, polyphagia and polyuria.  Genitourinary: Negative for dysuria, frequency, hematuria and urgency.  Musculoskeletal: Negative for back pain and neck stiffness.  Skin: Negative for rash.  Allergic/Immunologic: Negative for immunocompromised state.  Neurological: Positive for headaches ( improved). Negative for syncope and light-headedness.  Hematological: Does not bruise/bleed easily.  Psychiatric/Behavioral: Negative  for sleep disturbance. The patient is not nervous/anxious.      Physical Exam Updated Vital Signs BP (!) 137/100 (BP Location: Right Arm)   Pulse 84   Temp 99.3 F (37.4 C) (Oral)   Resp 16   Ht 6\' 6"  (1.981 m)   Wt 96.3 kg (212 lb 3.2 oz)   SpO2 100%   BMI 24.52 kg/m   Physical Exam  Constitutional: He is oriented to person, place, and time. He appears well-developed and well-nourished. No distress.  HENT:  Head: Normocephalic and atraumatic.  Right Ear: Tympanic membrane is bulging. Tympanic membrane is not injected and not erythematous. A middle ear effusion (clear) is present.  Left Ear: Tympanic membrane is not injected, not erythematous and not bulging.  No middle ear effusion.  Mouth/Throat: Oropharynx is clear and moist.  Eyes: Pupils are equal, round, and reactive to light. Conjunctivae and EOM are normal. No scleral icterus.  No horizontal, vertical or rotational nystagmus  Neck: Normal range of motion. Neck supple.  Full active and passive ROM without pain No midline or paraspinal tenderness No nuchal rigidity or meningeal signs  Cardiovascular: Normal rate, regular rhythm and intact distal pulses.   Pulmonary/Chest: Effort normal and breath sounds normal. No respiratory distress. He has no wheezes. He has no rales.  Abdominal: Soft. Bowel sounds are normal. There is no tenderness. There is no rebound and no guarding.  Musculoskeletal: Normal range of motion.  Lymphadenopathy:    He has no cervical adenopathy.  Neurological: He is alert and oriented to person, place, and time. No cranial nerve deficit. He exhibits normal muscle tone. Coordination normal.  Mental Status:  Alert, oriented, thought content appropriate. Speech fluent without evidence of aphasia. Able to follow 2 step commands without difficulty.  Cranial Nerves:  II:  Peripheral visual fields grossly normal, pupils equal, round, reactive to light III,IV, VI: ptosis not present, extra-ocular motions  intact bilaterally  V,VII: smile symmetric, facial light touch sensation equal VIII: hearing grossly normal bilaterally  IX,X: midline uvula rise  XI: bilateral shoulder shrug equal and strong XII: midline tongue extension  Motor:  5/5 in upper and lower extremities bilaterally including strong and equal grip strength and dorsiflexion/plantar flexion Sensory: Pinprick and light touch normal in all extremities.  Cerebellar: normal finger-to-nose with bilateral upper extremities Gait: normal gait and balance CV: distal pulses palpable throughout   Skin: Skin is warm and dry. No rash noted. He is not diaphoretic.  Psychiatric: He has a normal mood and affect. His behavior is normal. Judgment and thought content normal.  Nursing note and vitals reviewed.    ED Treatments / Results   Radiology Ct Head Wo Contrast  Result Date: 06/29/2017 CLINICAL DATA:  HEADACHE Cerebral aneurysm risk, patient/family hx or APKD. Right-sided headache. EXAM: CT HEAD WITHOUT CONTRAST TECHNIQUE: Contiguous axial images were obtained from the base of the skull through the vertex without intravenous contrast. COMPARISON:  Head CT 02/24/2012 FINDINGS: Brain: No intracranial hemorrhage, mass effect, or midline shift. No hydrocephalus. The basilar cisterns are patent. No evidence of  territorial infarct or acute ischemia. No extra-axial or intracranial fluid collection. Vascular: No hyperdense vessel or unexpected calcification. Skull: No skull fracture.  No focal skull lesion. 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 Sinuses are otherwise clear. Mastoid air cells are well-aerated. Visualized orbits are normal. Other: None. IMPRESSION: No acute intracranial abnormality. Electronically Signed   By: Rubye Oaks M.D.   On: 06/29/2017 02:33    Procedures Procedures (including critical care time)  Medications Ordered in ED Medications  oxymetazoline (AFRIN) 0.05 % nasal spray 2 spray (2  sprays Each Nare Given 06/29/17 0227)  ibuprofen (ADVIL,MOTRIN) tablet 800 mg (800 mg Oral Given 06/29/17 0426)     Initial Impression / Assessment and Plan / ED Course  I have reviewed the triage vital signs and the nursing notes.  Pertinent labs & imaging results that were available during my care of the patient were reviewed by me and considered in my medical decision making (see chart for details).     Patient presents with right-sided headache and otalgia. His headache has resolved. CT scan within 6 hours shows no evidence of subarachnoid hemorrhage. There is no mass effect or midline shift. Patient is well appearing with normal neurologic exam. On exam, right TM has clear effusion. He was given Afrin here in the emergency department help alleviate pain and pressure behind his right TM.  Patient will be discharged with prescription for Flonase. He is to follow-up with neurology for further evaluation of potential aneurysm due to family history risk. He is to return immediately to the emergency department for new onset headaches. Patient states understanding and is in agreement with the plan.  Final Clinical Impressions(s) / ED Diagnoses   Final diagnoses:  Bad headache  Acute effusion of right ear  Fluid level behind tympanic membrane of right ear    New Prescriptions Discharge Medication List as of 06/29/2017  4:36 AM    START taking these medications   Details  fluticasone (FLONASE) 50 MCG/ACT nasal spray Place 2 sprays into both nostrils daily., Starting Mon 06/29/2017, Print         Koichi Platte, [place], PA-C 06/29/17 9485    Azalia Bilis, MD 07/02/17 [phone]

## 2017-06-29 NOTE — ED Notes (Signed)
Patient states at 2030 last night he had a "sharp pain" right top of head that settled behind his right ear and now he has some stiffness-evaluated by EDPA Hannah-orders noted-medicated as ordered and to CT-patient is alert and oriented and MAE x 4 and is in no acute distress-speech clear and appropriate

## 2017-06-29 NOTE — ED Notes (Signed)
Returned from CT.

## 2018-03-31 ENCOUNTER — Other Ambulatory Visit: Payer: Self-pay

## 2018-03-31 ENCOUNTER — Encounter (HOSPITAL_COMMUNITY): Payer: Self-pay

## 2018-03-31 ENCOUNTER — Inpatient Hospital Stay (HOSPITAL_COMMUNITY)
Admission: EM | Admit: 2018-03-31 | Discharge: 2018-04-03 | DRG: 683 | Disposition: A | Discharge status: Home / Self Care | Payer: Self-pay | Attending: Internal Medicine | Admitting: Internal Medicine

## 2018-03-31 DIAGNOSIS — E876 Hypokalemia: Secondary | ICD-10-CM | POA: Diagnosis present

## 2018-03-31 DIAGNOSIS — F1290 Cannabis use, unspecified, uncomplicated: Secondary | ICD-10-CM | POA: Diagnosis present

## 2018-03-31 DIAGNOSIS — Z7951 Long term (current) use of inhaled steroids: Secondary | ICD-10-CM

## 2018-03-31 DIAGNOSIS — Z79899 Other long term (current) drug therapy: Secondary | ICD-10-CM

## 2018-03-31 DIAGNOSIS — E860 Dehydration: Secondary | ICD-10-CM | POA: Diagnosis present

## 2018-03-31 DIAGNOSIS — E872 Acidosis, unspecified: Secondary | ICD-10-CM

## 2018-03-31 DIAGNOSIS — Z833 Family history of diabetes mellitus: Secondary | ICD-10-CM

## 2018-03-31 DIAGNOSIS — D649 Anemia, unspecified: Secondary | ICD-10-CM | POA: Diagnosis present

## 2018-03-31 DIAGNOSIS — M109 Gout, unspecified: Secondary | ICD-10-CM | POA: Diagnosis present

## 2018-03-31 DIAGNOSIS — F17210 Nicotine dependence, cigarettes, uncomplicated: Secondary | ICD-10-CM | POA: Diagnosis present

## 2018-03-31 DIAGNOSIS — I10 Essential (primary) hypertension: Secondary | ICD-10-CM | POA: Diagnosis present

## 2018-03-31 DIAGNOSIS — N179 Acute kidney failure, unspecified: Principal | ICD-10-CM | POA: Diagnosis present

## 2018-03-31 DIAGNOSIS — E119 Type 2 diabetes mellitus without complications: Secondary | ICD-10-CM | POA: Diagnosis present

## 2018-03-31 DIAGNOSIS — R112 Nausea with vomiting, unspecified: Secondary | ICD-10-CM | POA: Diagnosis present

## 2018-03-31 DIAGNOSIS — R197 Diarrhea, unspecified: Secondary | ICD-10-CM

## 2018-03-31 DIAGNOSIS — E871 Hypo-osmolality and hyponatremia: Secondary | ICD-10-CM

## 2018-03-31 DIAGNOSIS — E8729 Other acidosis: Secondary | ICD-10-CM

## 2018-03-31 DIAGNOSIS — F1010 Alcohol abuse, uncomplicated: Secondary | ICD-10-CM | POA: Diagnosis present

## 2018-03-31 LAB — COMPREHENSIVE METABOLIC PANEL
ALBUMIN: 5.1 g/dL — AB (ref 3.5–5.0)
ALK PHOS: 70 U/L (ref 38–126)
ALT: 57 U/L (ref 17–63)
ANION GAP: 21 — AB (ref 5–15)
AST: 76 U/L — ABNORMAL HIGH (ref 15–41)
BUN: 24 mg/dL — ABNORMAL HIGH (ref 6–20)
CALCIUM: 9.9 mg/dL (ref 8.9–10.3)
CHLORIDE: 81 mmol/L — AB (ref 101–111)
CO2: 21 mmol/L — AB (ref 22–32)
Creatinine, Ser: 2.93 mg/dL — ABNORMAL HIGH (ref 0.61–1.24)
GFR calc Af Amer: 29 mL/min — ABNORMAL LOW (ref >60)
GFR calc non Af Amer: 25 mL/min — ABNORMAL LOW (ref >60)
Glucose, Bld: 183 mg/dL — ABNORMAL HIGH (ref 65–99)
Potassium: 3.7 mmol/L (ref 3.5–5.1)
SODIUM: 123 mmol/L — AB (ref 135–145)
Total Bilirubin: 1.2 mg/dL (ref 0.3–1.2)
Total Protein: 9.7 g/dL — ABNORMAL HIGH (ref 6.5–8.1)

## 2018-03-31 LAB — BASIC METABOLIC PANEL
ANION GAP: 15 (ref 5–15)
ANION GAP: 15 (ref 5–15)
BUN: 24 mg/dL — ABNORMAL HIGH (ref 6–20)
BUN: 26 mg/dL — ABNORMAL HIGH (ref 6–20)
CALCIUM: 9 mg/dL (ref 8.9–10.3)
CO2: 24 mmol/L (ref 22–32)
CO2: 24 mmol/L (ref 22–32)
Calcium: 9.2 mg/dL (ref 8.9–10.3)
Chloride: 87 mmol/L — ABNORMAL LOW (ref 101–111)
Chloride: 86 mmol/L — ABNORMAL LOW (ref 101–111)
Creatinine, Ser: 2.5 mg/dL — ABNORMAL HIGH (ref 0.61–1.24)
Creatinine, Ser: 2.27 mg/dL — ABNORMAL HIGH (ref 0.61–1.24)
GFR, EST AFRICAN AMERICAN: 35 mL/min — AB (ref >60)
GFR, EST AFRICAN AMERICAN: 40 mL/min — AB (ref >60)
GFR, EST NON AFRICAN AMERICAN: 31 mL/min — AB (ref >60)
GFR, EST NON AFRICAN AMERICAN: 34 mL/min — AB (ref >60)
GLUCOSE: 140 mg/dL — AB (ref 65–99)
Glucose, Bld: 135 mg/dL — ABNORMAL HIGH (ref 65–99)
POTASSIUM: 3.4 mmol/L — AB (ref 3.5–5.1)
POTASSIUM: 3.4 mmol/L — AB (ref 3.5–5.1)
Sodium: 126 mmol/L — ABNORMAL LOW (ref 135–145)
Sodium: 125 mmol/L — ABNORMAL LOW (ref 135–145)

## 2018-03-31 LAB — HEMOGLOBIN A1C
HEMOGLOBIN A1C: 5.6 % (ref 4.8–5.6)
Mean Plasma Glucose: 114.02 mg/dL

## 2018-03-31 LAB — URINALYSIS, ROUTINE W REFLEX MICROSCOPIC
Glucose, UA: NEGATIVE mg/dL
KETONES UR: 5 mg/dL — AB
Nitrite: NEGATIVE
PH: 5 (ref 5.0–8.0)
Protein, ur: 100 mg/dL — AB
SPECIFIC GRAVITY, URINE: 1.02 (ref 1.005–1.030)

## 2018-03-31 LAB — CBC
HCT: 39.9 % (ref 39.0–52.0)
Hemoglobin: 14.2 g/dL (ref 13.0–17.0)
MCH: 31.5 pg (ref 26.0–34.0)
MCHC: 35.6 g/dL (ref 30.0–36.0)
MCV: 88.5 fL (ref 78.0–100.0)
PLATELETS: 314 10*3/uL (ref 150–400)
RBC: 4.51 MIL/uL (ref 4.22–5.81)
RDW: 13.5 % (ref 11.5–15.5)
WBC: 7.4 10*3/uL (ref 4.0–10.5)

## 2018-03-31 LAB — GLUCOSE, CAPILLARY: GLUCOSE-CAPILLARY: 119 mg/dL — AB (ref 65–99)

## 2018-03-31 LAB — BETA-HYDROXYBUTYRIC ACID: BETA-HYDROXYBUTYRIC ACID: 0.39 mmol/L — AB (ref 0.05–0.27)

## 2018-03-31 LAB — CBG MONITORING, ED: Glucose-Capillary: 214 mg/dL — ABNORMAL HIGH (ref 65–99)

## 2018-03-31 LAB — LIPASE, BLOOD: Lipase: 42 U/L (ref 11–51)

## 2018-03-31 LAB — CK: Total CK: 355 U/L (ref 49–397)

## 2018-03-31 MED ORDER — FAMOTIDINE 20 MG PO TABS
20.0000 mg | ORAL_TABLET | Freq: Two times a day (BID) | ORAL | Status: DC | PRN
  Administered 2018-04-01 – 2018-04-02 (×2): 20 mg via ORAL
  Filled 2018-03-31 (×2): qty 1

## 2018-03-31 MED ORDER — FOLIC ACID 1 MG PO TABS
1.0000 mg | ORAL_TABLET | Freq: Every day | ORAL | Status: DC
  Administered 2018-03-31 – 2018-04-02 (×3): 1 mg via ORAL
  Filled 2018-03-31 (×3): qty 1

## 2018-03-31 MED ORDER — INSULIN ASPART 100 UNIT/ML ~~LOC~~ SOLN
0.0000 [IU] | Freq: Three times a day (TID) | SUBCUTANEOUS | Status: DC
  Administered 2018-04-01: 3 [IU] via SUBCUTANEOUS

## 2018-03-31 MED ORDER — LORAZEPAM 2 MG/ML IJ SOLN: 1.0000 mg | Freq: Four times a day (QID) | INTRAMUSCULAR | Status: DC | PRN

## 2018-03-31 MED ORDER — ACETAMINOPHEN 325 MG PO TABS
650.0000 mg | ORAL_TABLET | Freq: Four times a day (QID) | ORAL | Status: DC | PRN
Start: 2018-03-31 — End: 2018-04-03
  Administered 2018-04-01: 650 mg via ORAL
  Filled 2018-03-31: qty 2

## 2018-03-31 MED ORDER — ONDANSETRON 4 MG PO TBDP
4.0000 mg | ORAL_TABLET | Freq: Once | ORAL | Status: AC | PRN
  Administered 2018-03-31: 4 mg via ORAL
  Filled 2018-03-31: qty 1

## 2018-03-31 MED ORDER — HYDRALAZINE HCL 20 MG/ML IJ SOLN
5.0000 mg | Freq: Four times a day (QID) | INTRAMUSCULAR | Status: DC | PRN
  Administered 2018-04-02: 5 mg via INTRAVENOUS
  Filled 2018-03-31: qty 1

## 2018-03-31 MED ORDER — POLYETHYLENE GLYCOL 3350 17 G PO PACK: 17.0000 g | PACK | Freq: Every day | ORAL | Status: DC | PRN

## 2018-03-31 MED ORDER — SODIUM CHLORIDE 0.9 % IV SOLN
INTRAVENOUS | Status: DC
  Administered 2018-03-31 – 2018-04-03 (×4): via INTRAVENOUS

## 2018-03-31 MED ORDER — ENOXAPARIN SODIUM 40 MG/0.4ML ~~LOC~~ SOLN
40.0000 mg | SUBCUTANEOUS | Status: DC
  Administered 2018-03-31 – 2018-04-02 (×3): 40 mg via SUBCUTANEOUS
  Filled 2018-03-31 (×3): qty 0.4

## 2018-03-31 MED ORDER — THIAMINE HCL 100 MG/ML IJ SOLN
100.0000 mg | Freq: Every day | INTRAMUSCULAR | Status: DC
  Administered 2018-04-03: 100 mg via INTRAVENOUS
  Filled 2018-03-31: qty 2

## 2018-03-31 MED ORDER — SODIUM CHLORIDE 0.9 % IV BOLUS
1000.0000 mL | Freq: Once | INTRAVENOUS | Status: AC
  Administered 2018-03-31: 1000 mL via INTRAVENOUS

## 2018-03-31 MED ORDER — LORAZEPAM 2 MG/ML IJ SOLN: 0.0000 mg | Freq: Two times a day (BID) | INTRAMUSCULAR | Status: DC

## 2018-03-31 MED ORDER — VITAMIN B-1 100 MG PO TABS
100.0000 mg | ORAL_TABLET | Freq: Every day | ORAL | Status: DC
  Administered 2018-03-31 – 2018-04-02 (×3): 100 mg via ORAL
  Filled 2018-03-31 (×3): qty 1

## 2018-03-31 MED ORDER — ACETAMINOPHEN 650 MG RE SUPP: 650.0000 mg | Freq: Four times a day (QID) | RECTAL | Status: DC | PRN

## 2018-03-31 MED ORDER — LORAZEPAM 1 MG PO TABS: 1.0000 mg | ORAL_TABLET | Freq: Four times a day (QID) | ORAL | Status: DC | PRN

## 2018-03-31 MED ORDER — SODIUM CHLORIDE 0.9 % IV BOLUS
1000.0000 mL | Freq: Once | INTRAVENOUS | Status: AC
Start: 2018-03-31 — End: 2018-03-31
  Administered 2018-03-31: 1000 mL via INTRAVENOUS

## 2018-03-31 MED ORDER — ONDANSETRON HCL 4 MG/2ML IJ SOLN
4.0000 mg | Freq: Four times a day (QID) | INTRAMUSCULAR | Status: DC | PRN
  Administered 2018-04-01 – 2018-04-02 (×2): 4 mg via INTRAVENOUS
  Filled 2018-03-31 (×2): qty 2

## 2018-03-31 MED ORDER — HYDROCODONE-ACETAMINOPHEN 5-325 MG PO TABS: 1.0000 | ORAL_TABLET | ORAL | Status: DC | PRN

## 2018-03-31 MED ORDER — LORAZEPAM 2 MG/ML IJ SOLN: 0.0000 mg | Freq: Four times a day (QID) | INTRAMUSCULAR | Status: DC

## 2018-03-31 MED ORDER — AMLODIPINE BESYLATE 5 MG PO TABS
5.0000 mg | ORAL_TABLET | Freq: Every day | ORAL | Status: DC
Start: 2018-03-31 — End: 2018-04-03
  Administered 2018-03-31 – 2018-04-03 (×4): 5 mg via ORAL
  Filled 2018-03-31 (×4): qty 1

## 2018-03-31 MED ORDER — ONDANSETRON HCL 4 MG/2ML IJ SOLN
4.0000 mg | Freq: Once | INTRAMUSCULAR | Status: AC
  Administered 2018-03-31: 4 mg via INTRAVENOUS
  Filled 2018-03-31: qty 2

## 2018-03-31 MED ORDER — ADULT MULTIVITAMIN W/MINERALS CH
1.0000 | ORAL_TABLET | Freq: Every day | ORAL | Status: DC
  Administered 2018-03-31 – 2018-04-03 (×4): 1 via ORAL
  Filled 2018-03-31 (×4): qty 1

## 2018-03-31 NOTE — ED Notes (Signed)
Pt has urinal and made aware of need for urine specimen. 

## 2018-03-31 NOTE — ED Provider Notes (Signed)
[facility]-EMERGENCY DEPT Provider Note   CSN: [id number] Arrival date & time: 03/31/18  1152     History   Chief Complaint Chief Complaint  Patient presents with  . Emesis  . Abdominal Pain    HPI Richard Lopez is a 42 y.o. male presenting for evaluation of abdominal pain, nausea, and body aches.  Patient states that on Sunday he was outside working on his lawn.  The next morning, he woke up with generalized body aches.  Since then, he has had epigastric abdominal pain and nausea and vomiting.  He is urinating less than normal, last urinated yesterday evening.  No bowel movements in the past 3 days.  He states he has been tolerating p.o. without difficulty.  He denies fevers, chills, cough, chest pain, shortness of breath.  He has a history of diabetes, but does not take medication for this.  He denies any other medical problems.  He states this feels different than when he had renal failure in the past.  He takes no medications daily.  With urination yesterday, he denies hematuria or dysuria.  HPI  Past Medical History:  Diagnosis Date  . Diabetes mellitus   . Essential hypertension   . Gastritis   . Gout     Patient Active Problem List   Diagnosis Date Noted  . Prediabetes 04/02/2017  . Gastritis 04/02/2017  . Intractable nausea and vomiting 04/01/2017  . Marijuana abuse 04/01/2017  . ARF (acute renal failure) (HCC) 07/29/2014  . Nausea with vomiting 07/29/2014  . Essential hypertension 07/29/2014  . Nausea & vomiting 07/29/2014    Past Surgical History:  Procedure Laterality Date  . NO PAST SURGERIES          Home Medications    Prior to Admission medications   Medication Sig Start Date End Date Taking? Authorizing Provider  famotidine (PEPCID) 20 MG tablet Take 1 tablet (20 mg total) by mouth 2 (two) times daily as needed for heartburn or indigestion. 04/02/17   Calvert Cantor, MD  fluticasone (FLONASE) 50 MCG/ACT nasal spray Place 2  sprays into both nostrils daily. 06/29/17   Muthersbaugh, Dahlia Client, PA-C  omeprazole (PRILOSEC OTC) 20 MG tablet Take 1 tablet (20 mg total) by mouth 2 (two) times daily. 04/02/17   Calvert Cantor, MD    Family History Family History  Problem Relation Age of Onset  . Diabetes Mellitus II Father   . Diabetes Mellitus II Other   . CAD Other     Social History Social History   Tobacco Use  . Smoking status: Current Every Day Smoker    Packs/day: 0.00    Types: Cigarettes  . Smokeless tobacco: Never Used  Substance Use Topics  . Alcohol use: Yes    Comment: 3 times a week  . Drug use: Yes    Types: Marijuana    Comment: 3 times a week     Allergies   Patient has no known allergies.   Review of Systems Review of Systems  Gastrointestinal: Positive for abdominal pain, constipation, nausea and vomiting.  Genitourinary: Positive for decreased urine volume.  Musculoskeletal: Positive for myalgias.  All other systems reviewed and are negative.    Physical Exam Updated Vital Signs BP (!) 140/110 (BP Location: Left Arm)   Pulse 99   Temp 98.2 F (36.8 C) (Oral)   Resp (!) 24   Ht 6\' 6"  (1.981 m)   Wt 102.1 kg (225 lb)   SpO2 100%   BMI  26.00 kg/m   Physical Exam  Constitutional: He is oriented to person, place, and time. He appears well-developed and well-nourished. No distress.  Patient pacing around the room, appears uncomfortable but in no distress.   HENT:  Head: Normocephalic and atraumatic.  Eyes: Pupils are equal, round, and reactive to light. Conjunctivae and EOM are normal.  Neck: Normal range of motion. Neck supple.  Cardiovascular: Regular rhythm and intact distal pulses.  Tachycardic.  Upper extremities cool and clammy with good radial pulses.  Lower extremities warm and dry.  Pulmonary/Chest: Effort normal and breath sounds normal. No respiratory distress. He has no wheezes.  Speaking in full sentences.  Clear lung sounds in all fields.  Abdominal: Soft.  Bowel sounds are normal. He exhibits no distension and no mass. There is no tenderness. There is no rebound and no guarding.  No tenderness to palpation of the abdomen.  Soft without rigidity, guarding, or distention.  Musculoskeletal: Normal range of motion.  Strength intact x4.  Sensation intact x4.  Radial pedal pulses intact bilaterally.  Color intact bilaterally.  Patient is ambulatory.  Neurological: He is alert and oriented to person, place, and time. No sensory deficit.  Skin: Skin is warm. Capillary refill takes less than 2 seconds.  Psychiatric: He has a normal mood and affect.  Nursing note and vitals reviewed.    ED Treatments / Results  Labs (all labs ordered are listed, but only abnormal results are displayed) Labs Reviewed  COMPREHENSIVE METABOLIC PANEL - Abnormal; Notable for the following components:      Result Value   Sodium 123 (*)    Chloride 81 (*)    CO2 21 (*)    Glucose, Bld 183 (*)    BUN 24 (*)    Creatinine, Ser 2.93 (*)    Total Protein 9.7 (*)    Albumin 5.1 (*)    AST 76 (*)    GFR calc non Af Amer 25 (*)    GFR calc Af Amer 29 (*)    Anion gap 21 (*)    All other components within normal limits  CBG MONITORING, ED - Abnormal; Notable for the following components:   Glucose-Capillary 214 (*)    All other components within normal limits  CBC  LIPASE, BLOOD  CK  URINALYSIS, ROUTINE W REFLEX MICROSCOPIC    EKG EKG Interpretation  Date/Time:  Wednesday Mar 31 2018 14:18:20 EDT Ventricular Rate:  98 PR Interval:    QRS Duration: 86 QT Interval:  370 QTC Calculation: 473 R Axis:   72 Text Interpretation:  Sinus rhythm Atrial premature complex Consider right atrial enlargement since last tracing no significant change Confirmed by Mancel Bale [phone]) on 03/31/2018 2:20:57 PM   Radiology No results found.  Procedures .Critical Care Performed by: Alveria Apley, PA-C Authorized by: Alveria Apley, PA-C   Critical care provider  statement:    Critical care time (minutes):  35   Critical care time was exclusive of:  Separately billable procedures and treating other patients and teaching time   Critical care was necessary to treat or prevent imminent or life-threatening deterioration of the following conditions:  Dehydration and metabolic crisis   Critical care was time spent personally by me on the following activities:  Blood draw for specimens, development of treatment plan with patient or surrogate, discussions with consultants, evaluation of patient's response to treatment, examination of patient, obtaining history from patient or surrogate, ordering and performing treatments and interventions, ordering and review of laboratory studies,  ordering and review of radiographic studies, pulse oximetry, re-evaluation of patient's condition and review of old charts   I assumed direction of critical care for this patient from another provider in my specialty: no   Comments:     Pt with critically low sodium, severe dehydration, and concerning acidosis.    (including critical care time)  Medications Ordered in ED Medications  sodium chloride 0.9 % bolus 1,000 mL (has no administration in time range)  ondansetron (ZOFRAN-ODT) disintegrating tablet 4 mg (4 mg Oral Given 03/31/18 1219)  sodium chloride 0.9 % bolus 1,000 mL (1,000 mLs Intravenous New Bag/Given 03/31/18 1415)     Initial Impression / Assessment and Plan / ED Course  I have reviewed the triage vital signs and the nursing notes.  Pertinent labs & imaging results that were available during my care of the patient were reviewed by me and considered in my medical decision making (see chart for details).     Patient presenting for evaluation of nausea, vomiting, body aches, abdominal pain.  Physical exam shows patient with cool and clammy upper extremities with good radial pulses.  No diaphoresis or clamminess noted elsewhere.  Patient is tachycardic but appears in no  distress.  History of hypertension, diabetes, and renal failure, does not take any medications daily.  Concern for kidney injury, dehydration, rhabdo.  Will obtain labs, urine, and reassess.  EKG ordered.  IV fluids started.  Vomiting has improved with Zofran.  EKG without STEMI or concerning abnormalities.  Heart rate improved with IV fluids, now around 100.  CK normal.  CBC reassuring.  CMP shows AKI, hyponatremia, and an anion gap.  Mild elevation of glucose at 183.  At this time, doubt DKA, more likely renal failure due to dehydration. Will call for admission.   Discussed with hospitalist, pt to be admitted.   Final Clinical Impressions(s) / ED Diagnoses   Final diagnoses:  AKI (acute kidney injury) (HCC)  Hyponatremia  Metabolic acidosis    ED Discharge Orders    None       Alveria Apley, PA-C 03/31/18 2253    Mancel Bale, MD 04/01/18 1420

## 2018-03-31 NOTE — H&P (Addendum)
History and Physical    Richard Lopez RAQ:[id number] DOB: February 21, 1976 DOA: 03/31/2018  PCP: Patient, No Pcp Per Patient coming from: Home  Chief Complaint: Nausea/vomiting/cramps  HPI: Richard Lopez is a 42 y.o. male with medical history significant of hypertension. Four days ago, patient reports being out all day in the sun performing lawn work and did not hydrate. The next day, he had leg cramps and nausea with vomiting that progressively worsened. He drank Gatorade and Pedialite without improvement of cramps. On Monday, he developed abdominal cramping followed by some hand cramping.   ED Course: Vitals: Afebrile, normal pulse, normal respirations, hypertensive, on room air. Labs: Sodium of 123, creatinine of 2.93, anion gap of 21, CO2 of 21, glucose of 183 Imaging: None available Medications/Course: 2 L of normal saline IV bolus  Review of Systems: Review of Systems  Constitutional: Negative for chills, fever and malaise/fatigue.  Respiratory: Negative for cough and shortness of breath.   Cardiovascular: Negative for chest pain, palpitations and leg swelling.  Gastrointestinal: Positive for abdominal pain, nausea and vomiting. Negative for constipation and diarrhea.  Musculoskeletal: Positive for myalgias.  Neurological: Negative for weakness.  All other systems reviewed and are negative.   Past Medical History:  Diagnosis Date  . Diabetes mellitus   . Essential hypertension   . Gastritis   . Gout     Past Surgical History:  Procedure Laterality Date  . NO PAST SURGERIES       reports that he has been smoking cigarettes.  He has been smoking about 0.00 packs per day. He has never used smokeless tobacco. He reports that he drinks alcohol. He reports that he has current or past drug history. Drug: Marijuana.  No Known Allergies  Family History  Problem Relation Age of Onset  . Diabetes Mellitus II Father   . Diabetes Mellitus II Other   . CAD Other     Prior to  Admission medications   Medication Sig Start Date End Date Taking? Authorizing Provider  famotidine (PEPCID) 20 MG tablet Take 1 tablet (20 mg total) by mouth 2 (two) times daily as needed for heartburn or indigestion. 04/02/17  Yes Calvert Cantor, MD  fluticasone (FLONASE) 50 MCG/ACT nasal spray Place 2 sprays into both nostrils daily. Patient not taking: Reported on 03/31/2018 06/29/17   Muthersbaugh, Dahlia Client, PA-C  omeprazole (PRILOSEC OTC) 20 MG tablet Take 1 tablet (20 mg total) by mouth 2 (two) times daily. Patient not taking: Reported on 03/31/2018 04/02/17   Calvert Cantor, MD    Physical Exam: Vitals:   03/31/18 1202 03/31/18 1212 03/31/18 1214 03/31/18 1420  BP: (!) 121/106   (!) 140/110  Pulse: (!) 120   99  Resp: 17   (!) 24  Temp:  98.2 F (36.8 C)    TempSrc: Oral Oral    SpO2: 100%   100%  Weight:   102.1 kg (225 lb)   Height:   6\' 6"  (1.981 m)      Constitutional: NAD, calm, comfortable Eyes: PERRL, lids and conjunctivae normal ENMT: Mucous membranes are dry. Posterior pharynx clear of any exudate or lesions.  Neck: normal, supple, no masses, no thyromegaly Respiratory: clear to auscultation bilaterally, no wheezing, no crackles. Normal respiratory effort. No accessory muscle use.  Cardiovascular: Regular rate and rhythm, no murmurs / rubs / gallops. No extremity edema. 2+ pedal pulses.   Abdomen: no tenderness, no masses palpated. No hepatosplenomegaly. Bowel sounds positive.  Musculoskeletal: no clubbing / cyanosis. No joint deformity  upper and lower extremities. Good ROM, no contractures. Normal muscle tone.  Skin: no rashes, lesions, ulcers. No induration Neurologic: CN 2-12 grossly intact. Sensation intact, DTR normal. Strength 5/5 in all 4.  Psychiatric: Normal judgment and insight. Alert and oriented x 3. Normal mood.   Labs on Admission: I have personally reviewed following labs and imaging studies  CBC: Recent Labs  Lab 03/31/18 1230  WBC 7.4  HGB 14.2    HCT 39.9  MCV 88.5  PLT 314   Basic Metabolic Panel: Recent Labs  Lab 03/31/18 1319  NA 123*  K 3.7  CL 81*  CO2 21*  GLUCOSE 183*  BUN 24*  CREATININE 2.93*  CALCIUM 9.9   GFR: Estimated Creatinine Clearance: 43.3 mL/min (A) (by C-G formula based on SCr of 2.93 mg/dL (H)). Liver Function Tests: Recent Labs  Lab 03/31/18 1319  AST 76*  ALT 57  ALKPHOS 70  BILITOT 1.2  PROT 9.7*  ALBUMIN 5.1*   Recent Labs  Lab 03/31/18 1319  LIPASE 42   No results for input(s): AMMONIA in the last 168 hours. Coagulation Profile: No results for input(s): INR, PROTIME in the last 168 hours. Cardiac Enzymes: Recent Labs  Lab 03/31/18 1400  CKTOTAL 355   BNP (last 3 results) No results for input(s): PROBNP in the last 8760 hours. HbA1C: No results for input(s): HGBA1C in the last 72 hours. CBG: Recent Labs  Lab 03/31/18 1222  GLUCAP 214*   Urine analysis:    Component Value Date/Time   COLORURINE AMBER (A) 04/01/2017 0541   APPEARANCEUR CLEAR 04/01/2017 0541   LABSPEC 1.020 04/01/2017 0541   PHURINE 6.0 04/01/2017 0541   GLUCOSEU NEGATIVE 04/01/2017 0541   HGBUR NEGATIVE 04/01/2017 0541   BILIRUBINUR NEGATIVE 04/01/2017 0541   KETONESUR 20 (A) 04/01/2017 0541   PROTEINUR 100 (A) 04/01/2017 0541   UROBILINOGEN 1.0 07/29/2014 1837   NITRITE NEGATIVE 04/01/2017 0541   LEUKOCYTESUR SMALL (A) 04/01/2017 0541    Radiological Exams on Admission: No results found.  EKG: Independently reviewed.  Sinus rhythm  Assessment/Plan Principal Problem:   Acute kidney injury (HCC) Active Problems:   Essential hypertension   Nausea & vomiting    Acute kidney injury Associated anion gap acidosis.  In setting of significant dehydration.  Given 2 L via IV bolus in the emergency department.  Possible the patient had some rhabdomyolysis, however, CK is within normal range today. -Urinalysis pending -Continue IV fluids as mentioned below  Hyponatremia Subacute. In  setting of significant vomiting.  Patient given IV fluids in the emergency department.  Asymptomatic. -BMP every 4 hours -Normal saline IV fluids at 100 mL/h  Nausea and vomiting Question secondary to severe dehydration and hyponatremia.  Improving. -Continue IV fluids -Zofran prn  Diabetes mellitus, type II Not currently on treatment.  Last hemoglobin A1c of 5.7 in May 2018.  Patient presented with mild acidosis with elevated anion gap in addition to hyperglycemia.  Meets criteria for DKA but clinically unlikely. -We will obtain serum ketones -Follow BMP  Alcohol abuse Last drink was three days ago. States he has decreased how much he drinks over the past few years. He has had alcohol withdrawal in the past. -CIWA  Essential hypertension Patient meets criteria for absence of urgency.  That this is the cause of his acute kidney injury. -Start amlodipine 5 mg daily -Hydralazine 5 mg IV every 6 hours as needed   DVT prophylaxis: Lovenox Code Status: Full code Family Communication: None at bedside Disposition  Plan: Discharge home once hyponatremia improved and able to tolerate oral intake Consults called: None Admission status: Observation, telemetry   Jacquelin Hawking, MD [facility] Pager [phone]  If 7PM-7AM, please contact night-coverage [email or web address] Password [facility]  03/31/2018, 4:12 PM

## 2018-03-31 NOTE — ED Triage Notes (Addendum)
Patient c/o mid upper abdominal pain and vomiting x 4 days. Patient actively vomiting in triage.

## 2018-03-31 NOTE — ED Notes (Addendum)
Lab called, needed a re-draw for lt green tube due to not enough blood in tube.   Pt was stuck twice in triage for blood draw. Lt green tube was not full due to pt's blood flow stopping during second blood draw.  Pt was called back to room before able to re-draw blood.

## 2018-03-31 NOTE — ED Notes (Signed)
Pt had drawn for labs:  Gold Blue Lavender  lt green dk green

## 2018-04-01 LAB — BASIC METABOLIC PANEL
ANION GAP: 12 (ref 5–15)
ANION GAP: 14 (ref 5–15)
Anion gap: 13 (ref 5–15)
Anion gap: 14 (ref 5–15)
Anion gap: 11 (ref 5–15)
Anion gap: 10 (ref 5–15)
BUN: 24 mg/dL — ABNORMAL HIGH (ref 6–20)
BUN: 23 mg/dL — ABNORMAL HIGH (ref 6–20)
BUN: 23 mg/dL — ABNORMAL HIGH (ref 6–20)
BUN: 24 mg/dL — AB (ref 6–20)
BUN: 26 mg/dL — AB (ref 6–20)
BUN: 21 mg/dL — ABNORMAL HIGH (ref 6–20)
CALCIUM: 8.4 mg/dL — AB (ref 8.9–10.3)
CALCIUM: 8.5 mg/dL — AB (ref 8.9–10.3)
CALCIUM: 8.5 mg/dL — AB (ref 8.9–10.3)
CALCIUM: 8.7 mg/dL — AB (ref 8.9–10.3)
CALCIUM: 8.8 mg/dL — AB (ref 8.9–10.3)
CO2: 24 mmol/L (ref 22–32)
CO2: 21 mmol/L — ABNORMAL LOW (ref 22–32)
CO2: 23 mmol/L (ref 22–32)
CO2: 23 mmol/L (ref 22–32)
CO2: 21 mmol/L — ABNORMAL LOW (ref 22–32)
CO2: 23 mmol/L (ref 22–32)
CREATININE: 1.14 mg/dL (ref 0.61–1.24)
CREATININE: 1.32 mg/dL — AB (ref 0.61–1.24)
CREATININE: 1.6 mg/dL — AB (ref 0.61–1.24)
CREATININE: 1.66 mg/dL — AB (ref 0.61–1.24)
CREATININE: 1.97 mg/dL — AB (ref 0.61–1.24)
Calcium: 8.4 mg/dL — ABNORMAL LOW (ref 8.9–10.3)
Chloride: 86 mmol/L — ABNORMAL LOW (ref 101–111)
Chloride: 90 mmol/L — ABNORMAL LOW (ref 101–111)
Chloride: 90 mmol/L — ABNORMAL LOW (ref 101–111)
Chloride: 91 mmol/L — ABNORMAL LOW (ref 101–111)
Chloride: 92 mmol/L — ABNORMAL LOW (ref 101–111)
Chloride: 95 mmol/L — ABNORMAL LOW (ref 101–111)
Creatinine, Ser: 1.37 mg/dL — ABNORMAL HIGH (ref 0.61–1.24)
GFR calc Af Amer: 47 mL/min — ABNORMAL LOW (ref >60)
GFR calc Af Amer: 58 mL/min — ABNORMAL LOW (ref >60)
GFR calc non Af Amer: 52 mL/min — ABNORMAL LOW (ref >60)
GFR, EST NON AFRICAN AMERICAN: 41 mL/min — AB (ref >60)
GFR, EST NON AFRICAN AMERICAN: 50 mL/min — AB (ref >60)
GLUCOSE: 103 mg/dL — AB (ref 65–99)
GLUCOSE: 107 mg/dL — AB (ref 65–99)
Glucose, Bld: 190 mg/dL — ABNORMAL HIGH (ref 65–99)
Glucose, Bld: 84 mg/dL (ref 65–99)
Glucose, Bld: 100 mg/dL — ABNORMAL HIGH (ref 65–99)
Glucose, Bld: 111 mg/dL — ABNORMAL HIGH (ref 65–99)
Potassium: 3.3 mmol/L — ABNORMAL LOW (ref 3.5–5.1)
Potassium: 2.8 mmol/L — ABNORMAL LOW (ref 3.5–5.1)
Potassium: 2.8 mmol/L — ABNORMAL LOW (ref 3.5–5.1)
Potassium: 4.2 mmol/L (ref 3.5–5.1)
Potassium: 3.8 mmol/L (ref 3.5–5.1)
Potassium: 3.6 mmol/L (ref 3.5–5.1)
SODIUM: 125 mmol/L — AB (ref 135–145)
SODIUM: 126 mmol/L — AB (ref 135–145)
SODIUM: 126 mmol/L — AB (ref 135–145)
SODIUM: 128 mmol/L — AB (ref 135–145)
SODIUM: 123 mmol/L — AB (ref 135–145)
SODIUM: 125 mmol/L — AB (ref 135–145)

## 2018-04-01 LAB — CBC
HEMATOCRIT: 34.6 % — AB (ref 39.0–52.0)
Hemoglobin: 12.2 g/dL — ABNORMAL LOW (ref 13.0–17.0)
MCH: 31.1 pg (ref 26.0–34.0)
MCHC: 35.3 g/dL (ref 30.0–36.0)
MCV: 88.3 fL (ref 78.0–100.0)
PLATELETS: 222 10*3/uL (ref 150–400)
RBC: 3.92 MIL/uL — ABNORMAL LOW (ref 4.22–5.81)
RDW: 13.5 % (ref 11.5–15.5)
WBC: 6 10*3/uL (ref 4.0–10.5)

## 2018-04-01 LAB — URINALYSIS, ROUTINE W REFLEX MICROSCOPIC
BACTERIA UA: NONE SEEN
Bilirubin Urine: NEGATIVE
GLUCOSE, UA: NEGATIVE mg/dL
KETONES UR: NEGATIVE mg/dL
Leukocytes, UA: NEGATIVE
Nitrite: NEGATIVE
PH: 6 (ref 5.0–8.0)
Protein, ur: NEGATIVE mg/dL
Specific Gravity, Urine: 1.005 (ref 1.005–1.030)

## 2018-04-01 LAB — SODIUM, URINE, RANDOM: Sodium, Ur: <10 mmol/L

## 2018-04-01 LAB — GLUCOSE, CAPILLARY: Glucose-Capillary: 201 mg/dL — ABNORMAL HIGH (ref 65–99)

## 2018-04-01 LAB — MAGNESIUM: Magnesium: 1.7 mg/dL (ref 1.7–2.4)

## 2018-04-01 LAB — OSMOLALITY: Osmolality: 271 mOsm/kg — ABNORMAL LOW (ref 275–295)

## 2018-04-01 LAB — OSMOLALITY, URINE: OSMOLALITY UR: 178 mosm/kg — AB (ref 300–900)

## 2018-04-01 MED ORDER — POTASSIUM CHLORIDE CRYS ER 20 MEQ PO TBCR
40.0000 meq | EXTENDED_RELEASE_TABLET | ORAL | Status: AC
  Administered 2018-04-01 (×2): 40 meq via ORAL
  Filled 2018-04-01 (×2): qty 2

## 2018-04-01 MED ORDER — POTASSIUM CHLORIDE 10 MEQ/100ML IV SOLN
10.0000 meq | INTRAVENOUS | Status: AC
  Administered 2018-04-01 (×4): 10 meq via INTRAVENOUS
  Filled 2018-04-01 (×4): qty 100

## 2018-04-01 NOTE — Progress Notes (Addendum)
PROGRESS NOTE    CAS TRACZ   HYI:[id number]  DOB: 1976/04/15  DOA: 03/31/2018 PCP: Patient, No Pcp Per   Brief Narrative:  Richard Lopez is a 42 y.o. male with medical history significant of hypertension who is not on medications. He was previously > 300 lbs and had diabetes but this has resolved with weight loss.  Four days ago, patient reports being out all day in the sun performing lawn work and did not hydrate. The next day, he had leg cramps and nausea with vomiting that progressively worsened. He drank Gatorade and Pedialite without improvement of cramps. On Monday, he developed abdominal cramping followed by some hand cramping.    Subjective: Cramping and nausea have resolved. He is drinking without trouble.     Assessment & Plan:   Principal Problem:    Nausea & vomiting   Acute kidney injury, Hyponatremia due to dehydration - cont IVF- tolerating clear liquids- advance diet to regular diet  Active Problems:  Hypokalemia - due to poor oral intake- replaced and has normalized- follow    Essential hypertension - started on Norvasc- discussed the importance of controlling BP  Prior DM2 - A1c 5.6 on 5/29  Alcohol use - he staets he does not drink daily but did > 1 yr ago - no withdrawal symptoms noted   UA + for hematuria, WBC and bacteria - recheck today   DVT prophylaxis: Lovenox Code Status: Full code Family Communication:  Disposition Plan: home in 1-2 days Consultants:   none Procedures:   none Antimicrobials:  Anti-infectives (From admission, onward)   None       Objective: Vitals:   03/31/18 1733 03/31/18 2208 04/01/18 0529 04/01/18 0823  BP: (!) 162/103 (!) 164/97 (!) 142/72 134/82  Pulse: 97 87 77 75  Resp: 18 16 18    Temp:  99.1 F (37.3 C) 98.2 F (36.8 C)   TempSrc:  Oral Oral   SpO2: 100% 100% 98%   Weight:      Height:        Intake/Output Summary (Last 24 hours) at 04/01/2018 1411 Last data filed at 04/01/2018  1128 Gross per 24 hour  Intake 3638.34 ml  Output 1175 ml  Net 2463.34 ml   Filed Weights   03/31/18 1214  Weight: 102.1 kg (225 lb)    Examination: General exam: Appears comfortable  HEENT: PERRLA, oral mucosa moist, no sclera icterus or thrush Respiratory system: Clear to auscultation. Respiratory effort normal. Cardiovascular system: S1 & S2 heard, RRR.   Gastrointestinal system: Abdomen soft, non-tender, nondistended. Normal bowel sound. No organomegaly Central nervous system: Alert and oriented. No focal neurological deficits. Extremities: No cyanosis, clubbing or edema Skin: No rashes or ulcers Psychiatry:  Mood & affect appropriate.     Data Reviewed: I have personally reviewed following labs and imaging studies  CBC: Recent Labs  Lab 03/31/18 1230 04/01/18 0448  WBC 7.4 6.0  HGB 14.2 12.2*  HCT 39.9 34.6*  MCV 88.5 88.3  PLT 314 222   Basic Metabolic Panel: Recent Labs  Lab 03/31/18 2046 04/01/18 0044 04/01/18 0448 04/01/18 0803 04/01/18 1217  NA 125* 123* 125* 125* 126*  K 3.4* 3.3* 2.8* 2.8* 4.2  CL 86* 86* 90* 90* 91*  CO2 24 24 21* 23 21*  GLUCOSE 135* 107* 103* 190* 84  BUN 26* 26* 24* 24* 23*  CREATININE 2.27* 1.97* 1.66* 1.60* 1.37*  CALCIUM 9.0 8.8* 8.4* 8.4* 8.7*  MG  --   --   --  1.7  --    GFR: Estimated Creatinine Clearance: 92.7 mL/min (A) (by C-G formula based on SCr of 1.37 mg/dL (H)). Liver Function Tests: Recent Labs  Lab 03/31/18 1319  AST 76*  ALT 57  ALKPHOS 70  BILITOT 1.2  PROT 9.7*  ALBUMIN 5.1*   Recent Labs  Lab 03/31/18 1319  LIPASE 42   No results for input(s): AMMONIA in the last 168 hours. Coagulation Profile: No results for input(s): INR, PROTIME in the last 168 hours. Cardiac Enzymes: Recent Labs  Lab 03/31/18 1400  CKTOTAL 355   BNP (last 3 results) No results for input(s): PROBNP in the last 8760 hours. HbA1C: Recent Labs    03/31/18 1230  HGBA1C 5.6   CBG: Recent Labs  Lab  03/31/18 1222 03/31/18 2205 04/01/18 0745  GLUCAP 214* 119* 201*   Lipid Profile: No results for input(s): CHOL, HDL, LDLCALC, TRIG, CHOLHDL, LDLDIRECT in the last 72 hours. Thyroid Function Tests: No results for input(s): TSH, T4TOTAL, FREET4, T3FREE, THYROIDAB in the last 72 hours. Anemia Panel: No results for input(s): VITAMINB12, FOLATE, FERRITIN, TIBC, IRON, RETICCTPCT in the last 72 hours. Urine analysis:    Component Value Date/Time   COLORURINE AMBER (A) 03/31/2018 1628   APPEARANCEUR CLOUDY (A) 03/31/2018 1628   LABSPEC 1.020 03/31/2018 1628   PHURINE 5.0 03/31/2018 1628   GLUCOSEU NEGATIVE 03/31/2018 1628   HGBUR MODERATE (A) 03/31/2018 1628   BILIRUBINUR SMALL (A) 03/31/2018 1628   KETONESUR 5 (A) 03/31/2018 1628   PROTEINUR 100 (A) 03/31/2018 1628   UROBILINOGEN 1.0 07/29/2014 1837   NITRITE NEGATIVE 03/31/2018 1628   LEUKOCYTESUR TRACE (A) 03/31/2018 1628   Sepsis Labs: @LABRCNTIP (procalcitonin:4,lacticidven:4) )No results found for this or any previous visit (from the past 240 hour(s)).       Radiology Studies: No results found.    Scheduled Meds: . amLODipine  5 mg Oral Daily  . enoxaparin (LOVENOX) injection  40 mg Subcutaneous Q24H  . folic acid  1 mg Oral Daily  . multivitamin with minerals  1 tablet Oral Daily  . thiamine  100 mg Oral Daily   Or  . thiamine  100 mg Intravenous Daily   Continuous Infusions: . sodium chloride 100 mL/hr at 04/01/18 0451     LOS: 0 days    Time spent in minutes: 35    04/03/18, MD [facility] Pager: [email or web address] Password [email or web address] 04/01/2018, 2:11 PM

## 2018-04-01 NOTE — Progress Notes (Signed)
Unable to schedule a PCP appointment for pt. Pt will need to call Monday June 3 for an appointment.  Pt is aware and understands. All three [facility] placed on discharge Follow-up Providers section.

## 2018-04-01 NOTE — Progress Notes (Signed)
Patient reports abdominal pain and emesis x 1 after eating salad;  RN UTA as patient had flushed.  Patient received PRN Zofran, pepcid, and tylenol; currently sleeping.

## 2018-04-02 DIAGNOSIS — E8729 Other acidosis: Secondary | ICD-10-CM

## 2018-04-02 DIAGNOSIS — E872 Acidosis: Secondary | ICD-10-CM

## 2018-04-02 DIAGNOSIS — R197 Diarrhea, unspecified: Secondary | ICD-10-CM

## 2018-04-02 DIAGNOSIS — N179 Acute kidney failure, unspecified: Secondary | ICD-10-CM | POA: Diagnosis present

## 2018-04-02 LAB — BASIC METABOLIC PANEL
Anion gap: 8 (ref 5–15)
Anion gap: 9 (ref 5–15)
BUN: 14 mg/dL (ref 6–20)
BUN: 18 mg/dL (ref 6–20)
CALCIUM: 8.5 mg/dL — AB (ref 8.9–10.3)
CO2: 25 mmol/L (ref 22–32)
CO2: 24 mmol/L (ref 22–32)
CREATININE: 0.91 mg/dL (ref 0.61–1.24)
CREATININE: 1.11 mg/dL (ref 0.61–1.24)
Calcium: 8.6 mg/dL — ABNORMAL LOW (ref 8.9–10.3)
Chloride: 95 mmol/L — ABNORMAL LOW (ref 101–111)
Chloride: 98 mmol/L — ABNORMAL LOW (ref 101–111)
GFR calc Af Amer: >60 mL/min (ref >60)
GFR calc non Af Amer: >60 mL/min (ref >60)
GFR calc non Af Amer: >60 mL/min (ref >60)
GLUCOSE: 100 mg/dL — AB (ref 65–99)
Glucose, Bld: 145 mg/dL — ABNORMAL HIGH (ref 65–99)
POTASSIUM: 3.8 mmol/L (ref 3.5–5.1)
Potassium: 3.8 mmol/L (ref 3.5–5.1)
SODIUM: 128 mmol/L — AB (ref 135–145)
Sodium: 131 mmol/L — ABNORMAL LOW (ref 135–145)

## 2018-04-02 LAB — CBC
HCT: 33.7 % — ABNORMAL LOW (ref 39.0–52.0)
Hemoglobin: 11.7 g/dL — ABNORMAL LOW (ref 13.0–17.0)
MCH: 30.4 pg (ref 26.0–34.0)
MCHC: 34.7 g/dL (ref 30.0–36.0)
MCV: 87.5 fL (ref 78.0–100.0)
Platelets: 225 10*3/uL (ref 150–400)
RBC: 3.85 MIL/uL — ABNORMAL LOW (ref 4.22–5.81)
RDW: 13.4 % (ref 11.5–15.5)
WBC: 5.9 10*3/uL (ref 4.0–10.5)

## 2018-04-02 MED ORDER — ONDANSETRON HCL 4 MG/2ML IJ SOLN
4.0000 mg | Freq: Four times a day (QID) | INTRAMUSCULAR | Status: DC
  Filled 2018-04-02: qty 2

## 2018-04-02 MED ORDER — HYDROMORPHONE HCL 1 MG/ML IJ SOLN
1.0000 mg | INTRAMUSCULAR | Status: DC | PRN
  Administered 2018-04-02: 1 mg via INTRAVENOUS
  Filled 2018-04-02: qty 1

## 2018-04-02 MED ORDER — KETOROLAC TROMETHAMINE 30 MG/ML IJ SOLN
30.0000 mg | Freq: Four times a day (QID) | INTRAMUSCULAR | Status: DC | PRN
  Administered 2018-04-02: 30 mg via INTRAVENOUS
  Filled 2018-04-02: qty 1

## 2018-04-02 MED ORDER — ONDANSETRON HCL 4 MG/2ML IJ SOLN
4.0000 mg | Freq: Four times a day (QID) | INTRAMUSCULAR | Status: DC
  Administered 2018-04-02 – 2018-04-03 (×4): 4 mg via INTRAVENOUS
  Filled 2018-04-02 (×4): qty 2

## 2018-04-02 MED ORDER — FAMOTIDINE IN NACL 20-0.9 MG/50ML-% IV SOLN
20.0000 mg | Freq: Two times a day (BID) | INTRAVENOUS | Status: DC
  Administered 2018-04-02 – 2018-04-03 (×2): 20 mg via INTRAVENOUS
  Filled 2018-04-02 (×2): qty 50

## 2018-04-02 NOTE — Discharge Instructions (Signed)
Please buy a blood pressure machine and start checking you blood pressures.    DASH Eating Plan DASH stands for "Dietary Approaches to Stop Hypertension." The DASH eating plan is a healthy eating plan that has been shown to reduce high blood pressure (hypertension). It may also reduce your risk for type 2 diabetes, heart disease, and stroke. The DASH eating plan may also help with weight loss. What are tips for following this plan? General guidelines  Avoid eating more than 2,300 mg (milligrams) of salt (sodium) a day. If you have hypertension, you may need to reduce your sodium intake to 1,500 mg a day.  Limit alcohol intake to no more than 1 drink a day for nonpregnant women and 2 drinks a day for men. One drink equals 12 oz of beer, 5 oz of wine, or 1 oz of hard liquor.  Work with your health care provider to maintain a healthy body weight or to lose weight. Ask what an ideal weight is for you.  Get at least 30 minutes of exercise that causes your heart to beat faster (aerobic exercise) most days of the week. Activities may include walking, swimming, or biking.  Work with your health care provider or diet and nutrition specialist (dietitian) to adjust your eating plan to your individual calorie needs. Reading food labels  Check food labels for the amount of sodium per serving. Choose foods with less than 5 percent of the Daily Value of sodium. Generally, foods with less than 300 mg of sodium per serving fit into this eating plan.  To find whole grains, look for the word "whole" as the first word in the ingredient list. Shopping  Buy products labeled as "low-sodium" or "no salt added."  Buy fresh foods. Avoid canned foods and premade or frozen meals. Cooking  Avoid adding salt when cooking. Use salt-free seasonings or herbs instead of table salt or sea salt. Check with your health care provider or pharmacist before using salt substitutes.  Do not fry foods. Cook foods using healthy  methods such as baking, boiling, grilling, and broiling instead.  Cook with heart-healthy oils, such as olive, canola, soybean, or sunflower oil. Meal planning   Eat a balanced diet that includes: ? 5 or more servings of fruits and vegetables each day. At each meal, try to fill half of your plate with fruits and vegetables. ? Up to 6-8 servings of whole grains each day. ? Less than 6 oz of lean meat, poultry, or fish each day. A 3-oz serving of meat is about the same size as a deck of cards. One egg equals 1 oz. ? 2 servings of low-fat dairy each day. ? A serving of nuts, seeds, or beans 5 times each week. ? Heart-healthy fats. Healthy fats called Omega-3 fatty acids are found in foods such as flaxseeds and coldwater fish, like sardines, salmon, and mackerel.  Limit how much you eat of the following: ? Canned or prepackaged foods. ? Food that is high in trans fat, such as fried foods. ? Food that is high in saturated fat, such as fatty meat. ? Sweets, desserts, sugary drinks, and other foods with added sugar. ? Full-fat dairy products.  Do not salt foods before eating.  Try to eat at least 2 vegetarian meals each week.  Eat more home-cooked food and less restaurant, buffet, and fast food.  When eating at a restaurant, ask that your food be prepared with less salt or no salt, if possible. What foods are recommended?  The items listed may not be a complete list. Talk with your dietitian about what dietary choices are best for you. Grains Whole-grain or whole-wheat bread. Whole-grain or whole-wheat pasta. Brown rice. [place]. Bulgur. Whole-grain and low-sodium cereals. Pita bread. Low-fat, low-sodium crackers. Whole-wheat flour tortillas. Vegetables Fresh or frozen vegetables (raw, steamed, roasted, or grilled). Low-sodium or reduced-sodium tomato and vegetable juice. Low-sodium or reduced-sodium tomato sauce and tomato paste. Low-sodium or reduced-sodium canned  vegetables. Fruits All fresh, dried, or frozen fruit. Canned fruit in natural juice (without added sugar). Meat and other protein foods Skinless chicken or [place]. Ground chicken or [place]. Pork with fat trimmed off. Fish and seafood. Egg whites. Dried beans, peas, or lentils. Unsalted nuts, nut butters, and seeds. Unsalted canned beans. Lean cuts of beef with fat trimmed off. Low-sodium, lean deli meat. Dairy Low-fat (1%) or fat-free (skim) milk. Fat-free, low-fat, or reduced-fat cheeses. Nonfat, low-sodium ricotta or cottage cheese. Low-fat or nonfat yogurt. Low-fat, low-sodium cheese. Fats and oils Soft margarine without trans fats. Vegetable oil. Low-fat, reduced-fat, or light mayonnaise and salad dressings (reduced-sodium). Canola, safflower, olive, soybean, and sunflower oils. Avocado. Seasoning and other foods Herbs. Spices. Seasoning mixes without salt. Unsalted popcorn and pretzels. Fat-free sweets. What foods are not recommended? The items listed may not be a complete list. Talk with your dietitian about what dietary choices are best for you. Grains Baked goods made with fat, such as croissants, muffins, or some breads. Dry pasta or rice meal packs. Vegetables Creamed or fried vegetables. Vegetables in a cheese sauce. Regular canned vegetables (not low-sodium or reduced-sodium). Regular canned tomato sauce and paste (not low-sodium or reduced-sodium). Regular tomato and vegetable juice (not low-sodium or reduced-sodium). Angie Fava. Olives. Fruits Canned fruit in a light or heavy syrup. Fried fruit. Fruit in cream or butter sauce. Meat and other protein foods Fatty cuts of meat. Ribs. Fried meat. Berniece Salines. Sausage. Bologna and other processed lunch meats. Salami. Fatback. Hotdogs. Bratwurst. Salted nuts and seeds. Canned beans with added salt. Canned or smoked fish. Whole eggs or egg yolks. Chicken or [place] with skin. Dairy Whole or 2% milk, cream, and half-and-half. Whole or full-fat  cream cheese. Whole-fat or sweetened yogurt. Full-fat cheese. Nondairy creamers. Whipped toppings. Processed cheese and cheese spreads. Fats and oils Butter. Stick margarine. Lard. Shortening. Ghee. Bacon fat. Tropical oils, such as coconut, palm kernel, or palm oil. Seasoning and other foods Salted popcorn and pretzels. Onion salt, garlic salt, seasoned salt, table salt, and sea salt. Worcestershire sauce. Tartar sauce. Barbecue sauce. Teriyaki sauce. Soy sauce, including reduced-sodium. Steak sauce. Canned and packaged gravies. Fish sauce. Oyster sauce. Cocktail sauce. Horseradish that you find on the shelf. Ketchup. Mustard. Meat flavorings and tenderizers. Bouillon cubes. Hot sauce and Tabasco sauce. Premade or packaged marinades. Premade or packaged taco seasonings. Relishes. Regular salad dressings. Where to find more information:  National Heart, Lung, and [place]: https://wilson-eaton.com/  American Heart Association: www.heart.org Summary  The DASH eating plan is a healthy eating plan that has been shown to reduce high blood pressure (hypertension). It may also reduce your risk for type 2 diabetes, heart disease, and stroke.  With the DASH eating plan, you should limit salt (sodium) intake to 2,300 mg a day. If you have hypertension, you may need to reduce your sodium intake to 1,500 mg a day.  When on the DASH eating plan, aim to eat more fresh fruits and vegetables, whole grains, lean proteins, low-fat dairy, and heart-healthy fats.  Work with your health care  provider or diet and nutrition specialist (dietitian) to adjust your eating plan to your individual calorie needs. This information is not intended to replace advice given to you by your health care provider. Make sure you discuss any questions you have with your health care provider. Document Released: 10/09/2011 Document Revised: 10/13/2016 Document Reviewed: 10/13/2016 Elsevier Interactive Patient Education  2018 Elsevier  Inc.  Blood Pressure Record Sheet Your blood pressure on this visit to the emergency department or clinic is elevated. This does not necessarily mean you have high blood pressure (hypertension), but it does mean that your blood pressure needs to be rechecked. Many times your blood pressure can increase due to illness, pain, anxiety, or other factors. We recommend that you get a series of blood pressure readings done over a period of 5 days. It is best to get a reading in the morning and one in the evening. You should make sure to sit and relax for 1-5 minutes before the reading is taken. Write the readings down and make a follow-up appointment with your health care provider to discuss the results. If there is not a free clinic or a drug store with a blood-pressure-taking machine near you, you can purchase blood-pressure-taking equipment from a drug store. Having one in the home allows you the convenience of taking your blood pressure while you are home and relaxed. Blood Pressure Log Date: _______________________  a.m. _____________________  p.m. _____________________  Date: _______________________  a.m. _____________________  p.m. _____________________  Date: _______________________  a.m. _____________________  p.m. _____________________  Date: _______________________  a.m. _____________________  p.m. _____________________  Date: _______________________  a.m. _____________________  p.m. _____________________  This information is not intended to replace advice given to you by your health care provider. Make sure you discuss any questions you have with your health care provider. Document Released: 07/19/2003 Document Revised: 10/03/2016 Document Reviewed: 12/13/2013 Elsevier Interactive Patient Education  2018 [employer].

## 2018-04-02 NOTE — Care Management Note (Addendum)
Case Management Note  Patient Details  Name: Richard Lopez MRN: [id number] Date of Birth: [birth year]  Subjective/Objective:     Nausea/vomiting/cramps            Action/Plan:Plan to discharge home with no needs. Information given to pt for medications Good Rx and telephone numbers listed on discharge to make a follow up appointment.    Expected Discharge Date:  (unknown)               Expected Discharge Plan:  Home/Self Care  In-House Referral:     Discharge planning Services  CM Consult, Follow-up appt scheduled  Post Acute Care Choice:    Choice offered to:     DME Arranged:    DME Agency:     HH Arranged:    HH Agency:     Status of Service:  Completed, signed off  If discussed at [employer] of Stay Meetings, dates discussed:    Additional CommentsGeni Bers, RN 04/02/2018, 12:24 PM

## 2018-04-02 NOTE — Progress Notes (Addendum)
PROGRESS NOTE    Richard Lopez   ZOX:[id number]  DOB: 11/12/1975  DOA: 03/31/2018 PCP: Patient, No Pcp Per   Brief Narrative:  Richard Lopez is a 42 y.o. male with medical history significant of hypertension who is not on medications. He was previously > 300 lbs and had diabetes but this has resolved with weight loss.  Four days ago, patient reports being out all day in the sun performing lawn work and did not hydrate. The next day, he had leg cramps and nausea with vomiting that progressively worsened. He drank Gatorade and Pedialite without improvement of cramps. On Monday, he developed abdominal cramping followed by some hand cramping.  Sodium 123, BUN 24, Cr 2.93, Bicarb 21 in ED.  Subjective: Felt well earlier today. Was able to eat eggs for breakfast but after I saw him, he ran to the bathroom and had vomiting and diarrhea. He also had left abdominal pain, sharp and constant.     Assessment & Plan:   Principal Problem:    Nausea & vomiting   Acute kidney injury, Hyponatremia due to dehydration - U sodium < 10 - 5/30- tolerating clear liquids- advance diet to regular diet - 5/31- creatinine and sodium have improved- unfortunately, he had 1 episode of vomiting and diarrhea today-- possibly ate too much too fast? Cont IVF- have asked him to limit how much he is eating- Toradol and heating pad for abdominal pain.  - Addendum: has had more vomiting and diarrhea and pain is now severe- add Dilaudid, Zofran QID and obtain stool pathogen panel- cut back to clear liquids Active Problems:  Hypokalemia - due to poor oral intake- replaced and has normalized- follow    Essential hypertension - started on Norvasc- discussed the importance of controlling BP  Prior DM2 - A1c 5.6 on 5/29  Anemia - possibly dilutional- outpt f/u  Alcohol use - he staets he does not drink daily but did > 1 yr ago - no withdrawal symptoms noted   UA + for hematuria, WBC and bacteria - rechecked - no  antibiotics were given and repeat UA was found to be normal -   DVT prophylaxis: Lovenox Code Status: Full code Family Communication:  Disposition Plan: home in 1-2 days Consultants:   none Procedures:   none Antimicrobials:  Anti-infectives (From admission, onward)   None       Objective: Vitals:   04/01/18 1423 04/01/18 2157 04/02/18 0526 04/02/18 1359  BP: 123/89 (!) 140/92 (!) 146/98 (!) 176/105  Pulse: 81 63 70 77  Resp: 18 18 16 18   Temp: 98.9 F (37.2 C) 98.6 F (37 C) 98 F (36.7 C) 98.9 F (37.2 C)  TempSrc: Oral Oral  Oral  SpO2: 100% 100% 100% 100%  Weight:      Height:        Intake/Output Summary (Last 24 hours) at 04/02/2018 1402 Last data filed at 04/02/2018 1356 Gross per 24 hour  Intake 2881.67 ml  Output 1830 ml  Net 1051.67 ml   Filed Weights   03/31/18 1214  Weight: 102.1 kg (225 lb)    Examination: General exam: Appears comfortable  HEENT: PERRLA, oral mucosa moist, no sclera icterus or thrush Respiratory system: Clear to auscultation. Respiratory effort normal. Cardiovascular system: S1 & S2 heard, RRR.   Gastrointestinal system: Abdomen soft, tender in left abdomen, nondistended. Normal bowel sound. No organomegaly Central nervous system: Alert and oriented. No focal neurological deficits. Extremities: No cyanosis, clubbing or edema Skin: No rashes  or ulcers Psychiatry:  Mood & affect appropriate.     Data Reviewed: I have personally reviewed following labs and imaging studies  CBC: Recent Labs  Lab 03/31/18 1230 04/01/18 0448 04/02/18 0433  WBC 7.4 6.0 5.9  HGB 14.2 12.2* 11.7*  HCT 39.9 34.6* 33.7*  MCV 88.5 88.3 87.5  PLT 314 222 225   Basic Metabolic Panel: Recent Labs  Lab 04/01/18 0803 04/01/18 1217 04/01/18 1535 04/01/18 2016 04/01/18 2339 04/02/18 0802  NA 125* 126* 126* 128* 128* 131*  K 2.8* 4.2 3.8 3.6 3.8 3.8  CL 90* 91* 92* 95* 95* 98*  CO2 23 21* 23 23 24 25   GLUCOSE 190* 84 100* 111* 145* 100*   BUN 24* 23* 23* 21* 18 14  CREATININE 1.60* 1.37* 1.32* 1.14 1.11 0.91  CALCIUM 8.4* 8.7* 8.5* 8.5* 8.5* 8.6*  MG 1.7  --   --   --   --   --    GFR: Estimated Creatinine Clearance: 139.5 mL/min (by C-G formula based on SCr of 0.91 mg/dL). Liver Function Tests: Recent Labs  Lab 03/31/18 1319  AST 76*  ALT 57  ALKPHOS 70  BILITOT 1.2  PROT 9.7*  ALBUMIN 5.1*   Recent Labs  Lab 03/31/18 1319  LIPASE 42   No results for input(s): AMMONIA in the last 168 hours. Coagulation Profile: No results for input(s): INR, PROTIME in the last 168 hours. Cardiac Enzymes: Recent Labs  Lab 03/31/18 1400  CKTOTAL 355   BNP (last 3 results) No results for input(s): PROBNP in the last 8760 hours. HbA1C: Recent Labs    03/31/18 1230  HGBA1C 5.6   CBG: Recent Labs  Lab 03/31/18 1222 03/31/18 2205 04/01/18 0745  GLUCAP 214* 119* 201*   Lipid Profile: No results for input(s): CHOL, HDL, LDLCALC, TRIG, CHOLHDL, LDLDIRECT in the last 72 hours. Thyroid Function Tests: No results for input(s): TSH, T4TOTAL, FREET4, T3FREE, THYROIDAB in the last 72 hours. Anemia Panel: No results for input(s): VITAMINB12, FOLATE, FERRITIN, TIBC, IRON, RETICCTPCT in the last 72 hours. Urine analysis:    Component Value Date/Time   COLORURINE YELLOW 04/01/2018 1425   APPEARANCEUR CLEAR 04/01/2018 1425   LABSPEC 1.005 04/01/2018 1425   PHURINE 6.0 04/01/2018 1425   GLUCOSEU NEGATIVE 04/01/2018 1425   HGBUR SMALL (A) 04/01/2018 1425   BILIRUBINUR NEGATIVE 04/01/2018 1425   KETONESUR NEGATIVE 04/01/2018 1425   PROTEINUR NEGATIVE 04/01/2018 1425   UROBILINOGEN 1.0 07/29/2014 1837   NITRITE NEGATIVE 04/01/2018 1425   LEUKOCYTESUR NEGATIVE 04/01/2018 1425   Sepsis Labs: @LABRCNTIP (procalcitonin:4,lacticidven:4) )No results found for this or any previous visit (from the past 240 hour(s)).       Radiology Studies: No results found.    Scheduled Meds: . amLODipine  5 mg Oral Daily  .  enoxaparin (LOVENOX) injection  40 mg Subcutaneous Q24H  . folic acid  1 mg Oral Daily  . multivitamin with minerals  1 tablet Oral Daily  . thiamine  100 mg Oral Daily   Or  . thiamine  100 mg Intravenous Daily   Continuous Infusions: . sodium chloride 100 mL/hr at 04/01/18 2135     LOS: 0 days    Time spent in minutes: 35    04/03/18, MD [facility] Pager: [email or web address] Password [email or web address] 04/02/2018, 2:02 PM

## 2018-04-03 DIAGNOSIS — E872 Acidosis: Secondary | ICD-10-CM

## 2018-04-03 DIAGNOSIS — I10 Essential (primary) hypertension: Secondary | ICD-10-CM

## 2018-04-03 DIAGNOSIS — R112 Nausea with vomiting, unspecified: Secondary | ICD-10-CM

## 2018-04-03 DIAGNOSIS — E871 Hypo-osmolality and hyponatremia: Secondary | ICD-10-CM

## 2018-04-03 DIAGNOSIS — N179 Acute kidney failure, unspecified: Principal | ICD-10-CM

## 2018-04-03 DIAGNOSIS — R197 Diarrhea, unspecified: Secondary | ICD-10-CM

## 2018-04-03 LAB — BASIC METABOLIC PANEL
Anion gap: 10 (ref 5–15)
BUN: 9 mg/dL (ref 6–20)
CHLORIDE: 100 mmol/L — AB (ref 101–111)
CO2: 20 mmol/L — AB (ref 22–32)
CREATININE: 0.83 mg/dL (ref 0.61–1.24)
Calcium: 8.4 mg/dL — ABNORMAL LOW (ref 8.9–10.3)
GFR calc non Af Amer: >60 mL/min (ref >60)
Glucose, Bld: 113 mg/dL — ABNORMAL HIGH (ref 65–99)
POTASSIUM: 3.6 mmol/L (ref 3.5–5.1)
Sodium: 130 mmol/L — ABNORMAL LOW (ref 135–145)

## 2018-04-03 MED ORDER — LISINOPRIL-HYDROCHLOROTHIAZIDE 10-12.5 MG PO TABS: 1.0000 | ORAL_TABLET | Freq: Every day | ORAL | 11 refills | Status: DC

## 2018-04-03 NOTE — Discharge Summary (Signed)
Physician Discharge Summary  Richard Lopez RFV:[id number] DOB: 08-20-1976 DOA: 03/31/2018  PCP: Patient, No Pcp Per  Admit date: 03/31/2018 Discharge date: 04/03/2018  Admitted From: home Disposition:  home   Recommendations for Outpatient Follow-up:  1. F/u on HTN and anemia    Discharge Condition:  stable   CODE STATUS:  Full code   Consultations:  none    Discharge Diagnoses:  Principal Problem:   Acute kidney injury (HCC) Active Problems:   Nausea/vomiting/ diarrhea   Hyponatremia   Metabolic acidosis Essential hypertension    Brief Summary: Richard Lopez is a 42 y.o.malewith medical history significant ofhypertension who is not on medications. He was previously > 300 lbs and had diabetes but this has resolved with weight loss.  Four days ago, patient reports being out all day in the sun performing lawn work and did not hydrate. The next day, he had leg cramps and nausea with vomiting that progressively worsened. He drank Gatorade and Pedialite without improvement of cramps. On Monday, he developed abdominal cramping followed by some hand cramping. Sodium 123, BUN 24, Cr 2.93, Bicarb 21 in ED.  Hospital Course:   Nausea & vomiting   Acute kidney injury, Hyponatremia due to dehydration - U sodium < 10 - 5/30- tolerating clear liquids- advance diet to regular diet - 5/31- creatinine and sodium have improved- unfortunately, he had 1 episode of vomiting and diarrhea today-- possibly ate too much too fast? Cont IVF- have asked him to limit how much he is eating- Toradol and heating pad for abdominal pain.  - doing much better today- diet advanced- he has tolerated 2 solid meals and is stable to go home  Active Problems:  Hypokalemia - due to poor oral intake- replaced and has normalized-      Essential hypertension  - discussed the importance of controlling BP and started on medication, Lisinopril/HCTZ ordered which he can obtain from [facility]  Prior DM2 - A1c 5.6  on 5/29  Anemia - possibly dilutional- outpt f/u  Alcohol use - he states he does not drink daily but did > 1 yr ago - no withdrawal symptoms noted   UA + for hematuria, WBC and bacteria - rechecked - no antibiotics were given and repeat UA was found to be normal -   Discharge Exam: Vitals:   04/02/18 2153 04/03/18 0512  BP: (!) 139/91 138/90  Pulse: 74 70  Resp: 14 14  Temp: 98.9 F (37.2 C) 98.8 F (37.1 C)  SpO2: 100% 100%   Vitals:   04/02/18 1616 04/02/18 2145 04/02/18 2153 04/03/18 0512  BP: 127/89 (!) 139/91 (!) 139/91 138/90  Pulse:  67 74 70  Resp:  14 14 14   Temp:  98.9 F (37.2 C) 98.9 F (37.2 C) 98.8 F (37.1 C)  TempSrc:  Oral Oral Oral  SpO2:   100% 100%  Weight:      Height:        General: Pt is alert, awake, not in acute distress Cardiovascular: RRR, S1/S2 +, no rubs, no gallops Respiratory: CTA bilaterally, no wheezing, no rhonchi Abdominal: Soft, NT, ND, bowel sounds + Extremities: no edema, no cyanosis   Discharge Instructions  Discharge Instructions    Diet - low sodium heart healthy   Complete by:  As directed    Increase activity slowly   Complete by:  As directed      Allergies as of 04/03/2018   No Known Allergies     Medication List  STOP taking these medications   fluticasone 50 MCG/ACT nasal spray Commonly known as:  FLONASE   omeprazole 20 MG tablet Commonly known as:  PRILOSEC OTC     TAKE these medications   famotidine 20 MG tablet Commonly known as:  PEPCID Take 1 tablet (20 mg total) by mouth 2 (two) times daily as needed for heartburn or indigestion.   lisinopril-hydrochlorothiazide 10-12.5 MG tablet Commonly known as:  ZESTORETIC Take 1 tablet by mouth daily.      Follow-up Information    [facility] Follow up on 04/05/2018.   Specialty:  Internal Medicine Why:  1. Please call on Monday for hospital follow up appointment. Two others are listed if you can not get an  appointment. Contact information: [address] [place] [address] [phone]       [facility] Follow up on 04/05/2018.   Specialty:  Family Medicine Why:  2. Please call on Monday for hospital follow up appointment. Please try calling if you are unable to get an appoinment from. 1. [facility]. Contact information: [address] [place] [address] [phone]       [facility] Follow up on 04/05/2018.   Why:  3.1. Please call on Monday for hospital follow up appointment. Try one of the trhee for a hospital follow up appointment. Contact information: [address] [place] [address] [phone]         No Known Allergies   Procedures/Studies:    No results found.   The results of significant diagnostics from this hospitalization (including imaging, microbiology, ancillary and laboratory) are listed below for reference.     Microbiology: No results found for this or any previous visit (from the past 240 hour(s)).   Labs: BNP (last 3 results) No results for input(s): BNP in the last 8760 hours. Basic Metabolic Panel: Recent Labs  Lab 04/01/18 0803  04/01/18 1535 04/01/18 2016 04/01/18 2339 04/02/18 0802 04/03/18 0417  NA 125*   < > 126* 128* 128* 131* 130*  K 2.8*   < > 3.8 3.6 3.8 3.8 3.6  CL 90*   < > 92* 95* 95* 98* 100*  CO2 23   < > 23 23 24 25  20*  GLUCOSE 190*   < > 100* 111* 145* 100* 113*  BUN 24*   < > 23* 21* 18 14 9   CREATININE 1.60*   < > 1.32* 1.14 1.11 0.91 0.83  CALCIUM 8.4*   < > 8.5* 8.5* 8.5* 8.6* 8.4*  MG 1.7  --   --   --   --   --   --    < > = values in this interval not displayed.   Liver Function Tests: Recent Labs  Lab 03/31/18 1319  AST 76*  ALT 57  ALKPHOS 70  BILITOT 1.2  PROT 9.7*  ALBUMIN 5.1*   Recent Labs  Lab 03/31/18 1319  LIPASE 42   No results for input(s): AMMONIA  in the last 168 hours. CBC: Recent Labs  Lab 03/31/18 1230 04/01/18 0448 04/02/18 0433  WBC 7.4 6.0 5.9  HGB 14.2 12.2* 11.7*  HCT 39.9 34.6* 33.7*  MCV 88.5 88.3 87.5  PLT 314 222 225   Cardiac Enzymes: Recent Labs  Lab 03/31/18 1400  CKTOTAL 355   BNP: Invalid input(s): POCBNP CBG: Recent Labs  Lab 03/31/18 1222 03/31/18 2205 04/01/18 0745  GLUCAP 214* 119* 201*   D-Dimer No results for input(s): DDIMER in the last 72 hours. Hgb A1c No results for input(s): HGBA1C in the last 72 hours. Lipid Profile No results for input(s): CHOL, HDL, LDLCALC, TRIG, CHOLHDL, LDLDIRECT in the last 72 hours. Thyroid function studies No results for input(s): TSH, T4TOTAL, T3FREE, THYROIDAB in the last 72 hours.  Invalid input(s): FREET3 Anemia work up No results for input(s): VITAMINB12, FOLATE, FERRITIN, TIBC, IRON, RETICCTPCT in the last 72 hours. Urinalysis    Component Value Date/Time   COLORURINE YELLOW 04/01/2018 1425   APPEARANCEUR CLEAR 04/01/2018 1425   LABSPEC 1.005 04/01/2018 1425   PHURINE 6.0 04/01/2018 1425   GLUCOSEU NEGATIVE 04/01/2018 1425   HGBUR SMALL (A) 04/01/2018 1425   BILIRUBINUR NEGATIVE 04/01/2018 1425   KETONESUR NEGATIVE 04/01/2018 1425   PROTEINUR NEGATIVE 04/01/2018 1425   UROBILINOGEN 1.0 07/29/2014 1837   NITRITE NEGATIVE 04/01/2018 1425   LEUKOCYTESUR NEGATIVE 04/01/2018 1425   Sepsis Labs Invalid input(s): PROCALCITONIN,  WBC,  LACTICIDVEN Microbiology No results found for this or any previous visit (from the past 240 hour(s)).   Time coordinating discharge in minutes: 55  SIGNED:   Calvert Cantor, MD  [facility] 04/03/2018, 6:36 PM Pager   If 7PM-7AM, please contact night-coverage [email or web address] Password [email or web address]

## 2018-11-06 DIAGNOSIS — E119 Type 2 diabetes mellitus without complications: Secondary | ICD-10-CM | POA: Diagnosis present

## 2018-11-06 DIAGNOSIS — S82255A Nondisplaced comminuted fracture of shaft of left tibia, initial encounter for closed fracture: Secondary | ICD-10-CM | POA: Diagnosis present

## 2018-11-06 DIAGNOSIS — Z79899 Other long term (current) drug therapy: Secondary | ICD-10-CM

## 2018-11-06 DIAGNOSIS — D62 Acute posthemorrhagic anemia: Secondary | ICD-10-CM | POA: Diagnosis present

## 2018-11-06 DIAGNOSIS — F329 Major depressive disorder, single episode, unspecified: Secondary | ICD-10-CM | POA: Diagnosis present

## 2018-11-06 DIAGNOSIS — S82872A Displaced pilon fracture of left tibia, initial encounter for closed fracture: Principal | ICD-10-CM | POA: Diagnosis present

## 2018-11-06 DIAGNOSIS — I10 Essential (primary) hypertension: Secondary | ICD-10-CM | POA: Diagnosis present

## 2018-11-06 DIAGNOSIS — W19XXXA Unspecified fall, initial encounter: Secondary | ICD-10-CM | POA: Diagnosis present

## 2018-11-06 DIAGNOSIS — F17210 Nicotine dependence, cigarettes, uncomplicated: Secondary | ICD-10-CM | POA: Diagnosis present

## 2018-11-07 ENCOUNTER — Emergency Department (HOSPITAL_COMMUNITY): Payer: Self-pay

## 2018-11-07 ENCOUNTER — Encounter (HOSPITAL_COMMUNITY): Payer: Self-pay | Admitting: *Deleted

## 2018-11-07 ENCOUNTER — Emergency Department (HOSPITAL_COMMUNITY): Payer: Self-pay | Admitting: Certified Registered"

## 2018-11-07 ENCOUNTER — Inpatient Hospital Stay (HOSPITAL_COMMUNITY): Payer: Self-pay

## 2018-11-07 ENCOUNTER — Inpatient Hospital Stay (HOSPITAL_COMMUNITY)
Admission: EM | Admit: 2018-11-07 | Discharge: 2018-11-10 | DRG: 493 | Disposition: A | Discharge status: Home / Self Care | Payer: Self-pay | Attending: Student | Admitting: Student

## 2018-11-07 ENCOUNTER — Encounter (HOSPITAL_COMMUNITY)
Admission: EM | Disposition: A | Discharge status: Home / Self Care | Payer: Self-pay | Source: Home / Self Care | Attending: Orthopedic Surgery

## 2018-11-07 ENCOUNTER — Other Ambulatory Visit: Payer: Self-pay

## 2018-11-07 DIAGNOSIS — S82255A Nondisplaced comminuted fracture of shaft of left tibia, initial encounter for closed fracture: Secondary | ICD-10-CM | POA: Diagnosis present

## 2018-11-07 DIAGNOSIS — S82202A Unspecified fracture of shaft of left tibia, initial encounter for closed fracture: Secondary | ICD-10-CM

## 2018-11-07 DIAGNOSIS — S82402A Unspecified fracture of shaft of left fibula, initial encounter for closed fracture: Secondary | ICD-10-CM

## 2018-11-07 DIAGNOSIS — T148XXA Other injury of unspecified body region, initial encounter: Secondary | ICD-10-CM

## 2018-11-07 DIAGNOSIS — Z419 Encounter for procedure for purposes other than remedying health state, unspecified: Secondary | ICD-10-CM

## 2018-11-07 DIAGNOSIS — S82309A Unspecified fracture of lower end of unspecified tibia, initial encounter for closed fracture: Secondary | ICD-10-CM

## 2018-11-07 DIAGNOSIS — S82302A Unspecified fracture of lower end of left tibia, initial encounter for closed fracture: Secondary | ICD-10-CM | POA: Diagnosis present

## 2018-11-07 HISTORY — DX: Anxiety disorder, unspecified: F41.9

## 2018-11-07 HISTORY — PX: EXTERNAL FIXATION LEG: SHX1549

## 2018-11-07 HISTORY — DX: Depression, unspecified: F32.A

## 2018-11-07 HISTORY — DX: Major depressive disorder, single episode, unspecified: F32.9

## 2018-11-07 HISTORY — DX: Unspecified osteoarthritis, unspecified site: M19.90

## 2018-11-07 LAB — CBC WITH DIFFERENTIAL/PLATELET
ABS IMMATURE GRANULOCYTES: 0.01 10*3/uL (ref 0.00–0.07)
Basophils Absolute: 0 10*3/uL (ref 0.0–0.1)
Basophils Relative: 0 %
EOS PCT: 2 %
Eosinophils Absolute: 0.1 10*3/uL (ref 0.0–0.5)
HCT: 32.9 % — ABNORMAL LOW (ref 39.0–52.0)
Hemoglobin: 11.3 g/dL — ABNORMAL LOW (ref 13.0–17.0)
Immature Granulocytes: 0 %
LYMPHS PCT: 61 %
Lymphs Abs: 2.5 10*3/uL (ref 0.7–4.0)
MCH: 30.4 pg (ref 26.0–34.0)
MCHC: 34.3 g/dL (ref 30.0–36.0)
MCV: 88.4 fL (ref 80.0–100.0)
MONO ABS: 0.6 10*3/uL (ref 0.1–1.0)
Monocytes Relative: 14 %
NEUTROS PCT: 23 %
Neutro Abs: 1 10*3/uL — ABNORMAL LOW (ref 1.7–7.7)
Platelets: 80 10*3/uL — ABNORMAL LOW (ref 150–400)
RBC: 3.72 MIL/uL — AB (ref 4.22–5.81)
RDW: 13.3 % (ref 11.5–15.5)
WBC: 4.2 10*3/uL (ref 4.0–10.5)
nRBC: 0 % (ref 0.0–0.2)

## 2018-11-07 LAB — BASIC METABOLIC PANEL
Anion gap: 16 — ABNORMAL HIGH (ref 5–15)
BUN: 6 mg/dL (ref 6–20)
CO2: 22 mmol/L (ref 22–32)
Calcium: 8.2 mg/dL — ABNORMAL LOW (ref 8.9–10.3)
Chloride: 88 mmol/L — ABNORMAL LOW (ref 98–111)
Creatinine, Ser: 0.85 mg/dL (ref 0.61–1.24)
GFR calc Af Amer: >60 mL/min (ref >60)
GFR calc non Af Amer: >60 mL/min (ref >60)
Glucose, Bld: 107 mg/dL — ABNORMAL HIGH (ref 70–99)
Potassium: 3.4 mmol/L — ABNORMAL LOW (ref 3.5–5.1)
SODIUM: 126 mmol/L — AB (ref 135–145)

## 2018-11-07 SURGERY — EXTERNAL FIXATION, LOWER EXTREMITY
Anesthesia: General | Laterality: Left

## 2018-11-07 MED ORDER — CEFAZOLIN SODIUM-DEXTROSE 2-3 GM-%(50ML) IV SOLR
INTRAVENOUS | Status: DC | PRN
  Administered 2018-11-07: 2 g via INTRAVENOUS

## 2018-11-07 MED ORDER — DEXAMETHASONE SODIUM PHOSPHATE 10 MG/ML IJ SOLN
INTRAMUSCULAR | Status: AC
  Filled 2018-11-07: qty 1

## 2018-11-07 MED ORDER — SUCCINYLCHOLINE CHLORIDE 200 MG/10ML IV SOSY
PREFILLED_SYRINGE | INTRAVENOUS | Status: DC | PRN
  Administered 2018-11-07: 160 mg via INTRAVENOUS

## 2018-11-07 MED ORDER — LIDOCAINE 2% (20 MG/ML) 5 ML SYRINGE
INTRAMUSCULAR | Status: AC
  Filled 2018-11-07: qty 5

## 2018-11-07 MED ORDER — FENTANYL CITRATE (PF) 250 MCG/5ML IJ SOLN
INTRAMUSCULAR | Status: AC
  Filled 2018-11-07: qty 5

## 2018-11-07 MED ORDER — HYDROMORPHONE HCL 1 MG/ML IJ SOLN
0.5000 mg | INTRAMUSCULAR | Status: DC | PRN
  Administered 2018-11-07 – 2018-11-08 (×3): 1 mg via INTRAVENOUS
  Filled 2018-11-07 (×4): qty 1

## 2018-11-07 MED ORDER — LACTATED RINGERS IV SOLN
INTRAVENOUS | Status: DC | PRN
  Administered 2018-11-07: 04:00:00 via INTRAVENOUS

## 2018-11-07 MED ORDER — FENTANYL CITRATE (PF) 250 MCG/5ML IJ SOLN
INTRAMUSCULAR | Status: DC | PRN
  Administered 2018-11-07: 100 ug via INTRAVENOUS
  Administered 2018-11-07: 50 ug via INTRAVENOUS

## 2018-11-07 MED ORDER — PROPOFOL 10 MG/ML IV BOLUS
INTRAVENOUS | Status: AC
  Filled 2018-11-07: qty 40

## 2018-11-07 MED ORDER — DOCUSATE SODIUM 100 MG PO CAPS
100.0000 mg | ORAL_CAPSULE | Freq: Two times a day (BID) | ORAL | Status: DC
  Administered 2018-11-07 – 2018-11-09 (×3): 100 mg via ORAL
  Filled 2018-11-07 (×5): qty 1

## 2018-11-07 MED ORDER — SODIUM CHLORIDE 0.9 % IR SOLN
Status: DC | PRN
  Administered 2018-11-07: 1000 mL

## 2018-11-07 MED ORDER — CEFAZOLIN SODIUM-DEXTROSE 2-4 GM/100ML-% IV SOLN
2.0000 g | Freq: Four times a day (QID) | INTRAVENOUS | Status: AC
  Administered 2018-11-07 (×3): 2 g via INTRAVENOUS
  Filled 2018-11-07 (×3): qty 100

## 2018-11-07 MED ORDER — OXYCODONE HCL 5 MG PO TABS
5.0000 mg | ORAL_TABLET | ORAL | Status: DC | PRN
  Filled 2018-11-07: qty 2

## 2018-11-07 MED ORDER — DEXAMETHASONE SODIUM PHOSPHATE 10 MG/ML IJ SOLN
INTRAMUSCULAR | Status: DC | PRN
  Administered 2018-11-07: 4 mg via INTRAVENOUS

## 2018-11-07 MED ORDER — PHENYLEPHRINE 40 MCG/ML (10ML) SYRINGE FOR IV PUSH (FOR BLOOD PRESSURE SUPPORT)
PREFILLED_SYRINGE | INTRAVENOUS | Status: AC
  Filled 2018-11-07: qty 10

## 2018-11-07 MED ORDER — KETOROLAC TROMETHAMINE 30 MG/ML IJ SOLN
30.0000 mg | Freq: Once | INTRAMUSCULAR | Status: AC | PRN
  Administered 2018-11-07: 30 mg via INTRAVENOUS

## 2018-11-07 MED ORDER — HYDROMORPHONE HCL 1 MG/ML IJ SOLN
INTRAMUSCULAR | Status: AC
  Filled 2018-11-07: qty 1

## 2018-11-07 MED ORDER — HYDROMORPHONE HCL 1 MG/ML IJ SOLN
0.2500 mg | INTRAMUSCULAR | Status: DC | PRN
  Administered 2018-11-07: 0.25 mg via INTRAVENOUS
  Administered 2018-11-07: 0.5 mg via INTRAVENOUS

## 2018-11-07 MED ORDER — CEFAZOLIN SODIUM-DEXTROSE 2-4 GM/100ML-% IV SOLN
INTRAVENOUS | Status: AC
  Filled 2018-11-07: qty 100

## 2018-11-07 MED ORDER — POVIDONE-IODINE 10 % EX SWAB: 2.0000 "application " | Freq: Once | CUTANEOUS | Status: DC

## 2018-11-07 MED ORDER — SUGAMMADEX SODIUM 200 MG/2ML IV SOLN
INTRAVENOUS | Status: DC | PRN
  Administered 2018-11-07: 230 mg via INTRAVENOUS

## 2018-11-07 MED ORDER — SUCCINYLCHOLINE CHLORIDE 200 MG/10ML IV SOSY
PREFILLED_SYRINGE | INTRAVENOUS | Status: AC
  Filled 2018-11-07: qty 10

## 2018-11-07 MED ORDER — POLYETHYLENE GLYCOL 3350 17 G PO PACK
17.0000 g | PACK | Freq: Every day | ORAL | Status: DC | PRN
  Filled 2018-11-07: qty 1

## 2018-11-07 MED ORDER — MIDAZOLAM HCL 2 MG/2ML IJ SOLN
INTRAMUSCULAR | Status: DC | PRN
  Administered 2018-11-07: 2 mg via INTRAVENOUS

## 2018-11-07 MED ORDER — ENOXAPARIN SODIUM 40 MG/0.4ML ~~LOC~~ SOLN
40.0000 mg | SUBCUTANEOUS | Status: DC
  Administered 2018-11-09 – 2018-11-10 (×2): 40 mg via SUBCUTANEOUS
  Filled 2018-11-07 (×2): qty 0.4

## 2018-11-07 MED ORDER — EPHEDRINE SULFATE-NACL 50-0.9 MG/10ML-% IV SOSY
PREFILLED_SYRINGE | INTRAVENOUS | Status: DC | PRN
  Administered 2018-11-07: 5 mg via INTRAVENOUS

## 2018-11-07 MED ORDER — ROCURONIUM BROMIDE 10 MG/ML (PF) SYRINGE
PREFILLED_SYRINGE | INTRAVENOUS | Status: DC | PRN
  Administered 2018-11-07: 10 mg via INTRAVENOUS
  Administered 2018-11-07: 40 mg via INTRAVENOUS

## 2018-11-07 MED ORDER — LIDOCAINE 2% (20 MG/ML) 5 ML SYRINGE
INTRAMUSCULAR | Status: DC | PRN
  Administered 2018-11-07: 100 mg via INTRAVENOUS

## 2018-11-07 MED ORDER — ACETAMINOPHEN 325 MG PO TABS
325.0000 mg | ORAL_TABLET | Freq: Four times a day (QID) | ORAL | Status: DC | PRN
  Filled 2018-11-07: qty 2

## 2018-11-07 MED ORDER — PROPOFOL 10 MG/ML IV BOLUS
INTRAVENOUS | Status: DC | PRN
  Administered 2018-11-07: 200 mg via INTRAVENOUS

## 2018-11-07 MED ORDER — SODIUM CHLORIDE 0.9 % IV BOLUS
1000.0000 mL | Freq: Once | INTRAVENOUS | Status: AC
  Administered 2018-11-07: 1000 mL via INTRAVENOUS

## 2018-11-07 MED ORDER — METOCLOPRAMIDE HCL 5 MG/ML IJ SOLN: 5.0000 mg | Freq: Three times a day (TID) | INTRAMUSCULAR | Status: DC | PRN

## 2018-11-07 MED ORDER — PROMETHAZINE HCL 25 MG/ML IJ SOLN: 6.2500 mg | INTRAMUSCULAR | Status: DC | PRN

## 2018-11-07 MED ORDER — SENNA 8.6 MG PO TABS
1.0000 | ORAL_TABLET | Freq: Two times a day (BID) | ORAL | Status: DC
  Administered 2018-11-07 – 2018-11-09 (×3): 8.6 mg via ORAL
  Filled 2018-11-07 (×5): qty 1

## 2018-11-07 MED ORDER — METOCLOPRAMIDE HCL 5 MG PO TABS
5.0000 mg | ORAL_TABLET | Freq: Three times a day (TID) | ORAL | Status: DC | PRN
  Filled 2018-11-07: qty 2

## 2018-11-07 MED ORDER — CHLORHEXIDINE GLUCONATE 4 % EX LIQD: 60.0000 mL | Freq: Once | CUTANEOUS | Status: DC

## 2018-11-07 MED ORDER — ONDANSETRON HCL 4 MG/2ML IJ SOLN: 4.0000 mg | Freq: Four times a day (QID) | INTRAMUSCULAR | Status: DC | PRN

## 2018-11-07 MED ORDER — KETOROLAC TROMETHAMINE 30 MG/ML IJ SOLN
INTRAMUSCULAR | Status: AC
  Filled 2018-11-07: qty 1

## 2018-11-07 MED ORDER — MEPERIDINE HCL 50 MG/ML IJ SOLN: 6.2500 mg | INTRAMUSCULAR | Status: DC | PRN

## 2018-11-07 MED ORDER — HYDROMORPHONE HCL 1 MG/ML IJ SOLN
0.5000 mg | Freq: Once | INTRAMUSCULAR | Status: AC
  Administered 2018-11-07: 0.5 mg via INTRAVENOUS
  Filled 2018-11-07: qty 1

## 2018-11-07 MED ORDER — ROCURONIUM BROMIDE 10 MG/ML (PF) SYRINGE
PREFILLED_SYRINGE | INTRAVENOUS | Status: AC
  Filled 2018-11-07: qty 10

## 2018-11-07 MED ORDER — MIDAZOLAM HCL 2 MG/2ML IJ SOLN
INTRAMUSCULAR | Status: AC
  Filled 2018-11-07: qty 2

## 2018-11-07 MED ORDER — PHENYLEPHRINE 40 MCG/ML (10ML) SYRINGE FOR IV PUSH (FOR BLOOD PRESSURE SUPPORT)
PREFILLED_SYRINGE | INTRAVENOUS | Status: DC | PRN
  Administered 2018-11-07 (×4): 120 ug via INTRAVENOUS
  Administered 2018-11-07: 80 ug via INTRAVENOUS
  Administered 2018-11-07: 180 ug via INTRAVENOUS

## 2018-11-07 MED ORDER — SUGAMMADEX SODIUM 500 MG/5ML IV SOLN
INTRAVENOUS | Status: AC
  Filled 2018-11-07: qty 5

## 2018-11-07 MED ORDER — ONDANSETRON HCL 4 MG PO TABS
4.0000 mg | ORAL_TABLET | Freq: Four times a day (QID) | ORAL | Status: DC | PRN
  Filled 2018-11-07: qty 1

## 2018-11-07 MED ORDER — ONDANSETRON HCL 4 MG/2ML IJ SOLN
INTRAMUSCULAR | Status: DC | PRN
  Administered 2018-11-07: 4 mg via INTRAVENOUS

## 2018-11-07 MED ORDER — CEFAZOLIN SODIUM-DEXTROSE 2-4 GM/100ML-% IV SOLN: 2.0000 g | INTRAVENOUS | Status: DC

## 2018-11-07 MED ORDER — OXYCODONE HCL 5 MG PO TABS
10.0000 mg | ORAL_TABLET | ORAL | Status: DC | PRN
  Administered 2018-11-07: 15 mg via ORAL
  Administered 2018-11-07: 10 mg via ORAL
  Administered 2018-11-08: 15 mg via ORAL
  Administered 2018-11-08 – 2018-11-10 (×4): 10 mg via ORAL
  Filled 2018-11-07: qty 2
  Filled 2018-11-07: qty 3
  Filled 2018-11-07: qty 2
  Filled 2018-11-07: qty 3
  Filled 2018-11-07 (×2): qty 2

## 2018-11-07 MED ORDER — SODIUM CHLORIDE 0.9 % IV SOLN
INTRAVENOUS | Status: DC
  Administered 2018-11-07 – 2018-11-09 (×6): via INTRAVENOUS

## 2018-11-07 MED ORDER — ONDANSETRON HCL 4 MG/2ML IJ SOLN
INTRAMUSCULAR | Status: AC
  Filled 2018-11-07: qty 2

## 2018-11-07 SURGICAL SUPPLY — 58 items
BAG SPEC THK2 15X12 ZIP CLS (MISCELLANEOUS) ×2
BAG ZIPLOCK 12X15 (MISCELLANEOUS) ×6 IMPLANT
BANDAGE ACE 4X5 VEL STRL LF (GAUZE/BANDAGES/DRESSINGS) ×3 IMPLANT
BANDAGE ACE 6X5 VEL STRL LF (GAUZE/BANDAGES/DRESSINGS) ×3 IMPLANT
BANDAGE ESMARK 6X9 LF (GAUZE/BANDAGES/DRESSINGS) ×1 IMPLANT
BAR EXFX 200X11 NS LF (EXFIX) ×1
BAR EXFX 500X11 NS LF (EXFIX) ×2
BAR GLASS FIBER EXFX 11X200 (EXFIX) ×2 IMPLANT
BAR GLASS FIBER EXFX 11X500 (EXFIX) ×4 IMPLANT
BNDG CMPR 9X6 STRL LF SNTH (GAUZE/BANDAGES/DRESSINGS) ×1
BNDG CMPR MED 15X6 ELC VLCR LF (GAUZE/BANDAGES/DRESSINGS) ×1
BNDG COHESIVE 6X5 TAN STRL LF (GAUZE/BANDAGES/DRESSINGS) ×3 IMPLANT
BNDG ELASTIC 6X15 VLCR STRL LF (GAUZE/BANDAGES/DRESSINGS) ×2 IMPLANT
BNDG ESMARK 6X9 LF (GAUZE/BANDAGES/DRESSINGS) ×3
BNDG GAUZE ELAST 4 BULKY (GAUZE/BANDAGES/DRESSINGS) ×3 IMPLANT
CLAMP BLUE BAR TO BAR (EXFIX) ×4 IMPLANT
CLAMP BLUE BAR TO PIN (EXFIX) ×4 IMPLANT
CLEANER TIP ELECTROSURG 2X2 (MISCELLANEOUS) ×3 IMPLANT
CLOSURE WOUND 1/2 X4 (GAUZE/BANDAGES/DRESSINGS) ×1
COVER MAYO STAND STRL (DRAPES) ×2 IMPLANT
COVER SURGICAL LIGHT HANDLE (MISCELLANEOUS) ×3 IMPLANT
COVER WAND RF STERILE (DRAPES) IMPLANT
CUFF TOURN SGL QUICK 34 (TOURNIQUET CUFF) ×3
CUFF TRNQT CYL 34X4X40X1 (TOURNIQUET CUFF) ×1 IMPLANT
DRAPE C-ARM 42X120 X-RAY (DRAPES) ×3 IMPLANT
DRAPE U-SHAPE 47X51 STRL (DRAPES) ×3 IMPLANT
DRSG ADAPTIC 3X8 NADH LF (GAUZE/BANDAGES/DRESSINGS) ×3 IMPLANT
ELECT REM PT RETURN 15FT ADLT (MISCELLANEOUS) ×3 IMPLANT
EVACUATOR 1/8 PVC DRAIN (DRAIN) ×3 IMPLANT
EVACUATOR SILICONE 100CC (DRAIN) ×3 IMPLANT
GAUZE SPONGE 4X4 12PLY STRL (GAUZE/BANDAGES/DRESSINGS) ×3 IMPLANT
GAUZE XEROFORM 5X9 LF (GAUZE/BANDAGES/DRESSINGS) ×2 IMPLANT
GLOVE ORTHO TXT STRL SZ7.5 (GLOVE) ×3 IMPLANT
GOWN STRL REUS W/TWL LRG LVL3 (GOWN DISPOSABLE) ×3 IMPLANT
HALF PIN 5.0X160 (EXFIX) ×4 IMPLANT
KIT BASIN OR (CUSTOM PROCEDURE TRAY) ×3 IMPLANT
NEEDLE HYPO 22GX1.5 SAFETY (NEEDLE) ×3 IMPLANT
NS IRRIG 1000ML POUR BTL (IV SOLUTION) ×3 IMPLANT
PACK ORTHO EXTREMITY (CUSTOM PROCEDURE TRAY) ×3 IMPLANT
PADDING CAST COTTON 6X4 STRL (CAST SUPPLIES) ×3 IMPLANT
PIN CLAMP 2BAR 75MM BLUE (EXFIX) ×2 IMPLANT
PIN TRANSFIXING 5.0 (EXFIX) ×2 IMPLANT
PROTECTOR NERVE ULNAR (MISCELLANEOUS) ×3 IMPLANT
SOL PREP POV-IOD 4OZ 10% (MISCELLANEOUS) ×3 IMPLANT
SOL PREP PROV IODINE SCRUB 4OZ (MISCELLANEOUS) ×3 IMPLANT
SPONGE LAP 18X18 RF (DISPOSABLE) ×3 IMPLANT
STAPLER VISISTAT 35W (STAPLE) ×3 IMPLANT
STOCKINETTE 8 INCH (MISCELLANEOUS) ×3 IMPLANT
STRIP CLOSURE SKIN 1/2X4 (GAUZE/BANDAGES/DRESSINGS) ×2 IMPLANT
SUCTION FRAZIER HANDLE 10FR (MISCELLANEOUS) ×2
SUCTION TUBE FRAZIER 10FR DISP (MISCELLANEOUS) ×1 IMPLANT
SUT ETHILON 3 0 PS 1 (SUTURE) ×3 IMPLANT
SUT VIC AB 2-0 CT1 27 (SUTURE) ×6
SUT VIC AB 2-0 CT1 TAPERPNT 27 (SUTURE) ×2 IMPLANT
SYR CONTROL 10ML LL (SYRINGE) ×3 IMPLANT
TOWEL OR 17X26 10 PK STRL BLUE (TOWEL DISPOSABLE) ×6 IMPLANT
WATER STERILE IRR 1000ML POUR (IV SOLUTION) ×3 IMPLANT
YANKAUER SUCT BULB TIP 10FT TU (MISCELLANEOUS) ×3 IMPLANT

## 2018-11-07 NOTE — Consult Note (Signed)
ORTHOPAEDIC CONSULTATION  REQUESTING PHYSICIAN: Glynn Octave, MD  PCP:  Patient, No Pcp Per  Chief Complaint: Left lower extremity injury.  HPI: Richard Lopez is a 43 y.o. male who complains of left lower extremity/ankle pain after he took a misstep off of the curb a couple hours ago.  He had left ankle pain and inability to weight-bear.  He was brought to the emergency department at [facility], where x-rays revealed a comminuted displaced distal tib-fib fracture.  Orthopedic consultation was placed for management.  Past Medical History:  Diagnosis Date  . Diabetes mellitus   . Essential hypertension   . Gastritis   . Gout    Past Surgical History:  Procedure Laterality Date  . NO PAST SURGERIES     Social History   Socioeconomic History  . Marital status: Single    Spouse name: Not on file  . Number of children: Not on file  . Years of education: Not on file  . Highest education level: Not on file  Occupational History  . Not on file  Social Needs  . Financial resource strain: Not on file  . Food insecurity:    Worry: Not on file    Inability: Not on file  . Transportation needs:    Medical: Not on file    Non-medical: Not on file  Tobacco Use  . Smoking status: Current Every Day Smoker    Packs/day: 0.00    Types: Cigarettes  . Smokeless tobacco: Never Used  . Tobacco comment: About 1 cigarette per day or less  Substance and Sexual Activity  . Alcohol use: Yes    Alcohol/week: 200.0 standard drinks    Types: 200 Cans of beer per week    Comment: 2-3 40oz beers per week  . Drug use: Yes    Types: Marijuana    Comment: 3 times a week  . Sexual activity: Yes  Lifestyle  . Physical activity:    Days per week: Not on file    Minutes per session: Not on file  . Stress: Not on file  Relationships  . Social connections:    Talks on phone: Not on file    Gets together: Not on file    Attends religious service: Not on file    Active member  of club or organization: Not on file    Attends meetings of clubs or organizations: Not on file    Relationship status: Not on file  Other Topics Concern  . Not on file  Social History Narrative  . Not on file   Family History  Problem Relation Age of Onset  . Diabetes Mellitus II Father   . Diabetes Mellitus II Other   . CAD Other    No Known Allergies Prior to Admission medications   Medication Sig Start Date End Date Taking? Authorizing Provider  famotidine (PEPCID) 20 MG tablet Take 1 tablet (20 mg total) by mouth 2 (two) times daily as needed for heartburn or indigestion. 04/02/17   Calvert Cantor, MD  lisinopril-hydrochlorothiazide (ZESTORETIC) 10-12.5 MG tablet Take 1 tablet by mouth daily. 04/03/18 04/03/19  Calvert Cantor, MD   Dg Ankle Complete Left  Result Date: 11/07/2018 CLINICAL DATA:  Left ankle pain and swelling after injury. Patient reports stepping off a curb. EXAM: LEFT ANKLE COMPLETE - 3+ VIEW COMPARISON:  None. FINDINGS: Comminuted and displaced fracture of the distal tibial metadiaphysis with apex lateral angulation. Oblique distal fibular fracture is displaced with lateral angulation. No  intra-articular extension. Ankle mortise is preserved. 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 Soft tissue edema noted about the ankle. IMPRESSION: Comminuted, displaced, and angulated distal tibial and fibular fractures. No ankle joint extension. Electronically Signed   By: Narda Rutherford M.D.   On: 11/07/2018 01:42   Dg Tibia/fibula Left Port  Result Date: 11/07/2018 CLINICAL DATA:  Preoperative radiographs prior to external fixation of tibial and fibular fractures. Initial encounter. EXAM: PORTABLE LEFT TIBIA AND FIBULA - 2 VIEW COMPARISON:  Left ankle radiographs performed earlier today at 1:17 a.m. FINDINGS: The comminuted fracture of the distal tibial metadiaphysis, and displaced fracture of the distal fibula, are grossly unchanged in appearance, with  surrounding soft tissue swelling. There appears to be extension to the tibial plafond. Mild medial and posterior angulation are noted. There is also a mildly displaced fracture of the proximal fibular diaphysis. A prominent knee joint effusion is suspected. Evaluation of the soft tissues is mildly suboptimal due to the overlying splint. IMPRESSION: 1. Comminuted fracture of the distal tibial metadiaphysis, and displaced fracture of the distal fibula, are grossly unchanged in appearance. There appears to be extension to the tibial plafond. Mild medial and posterior angulation noted. 2. Mildly displaced fracture of the proximal fibular diaphysis. 3. Suspect prominent knee joint effusion. Electronically Signed   By: Roanna Raider M.D.   On: 11/07/2018 03:15    Positive ROS: All other systems have been reviewed and were otherwise negative with the exception of those mentioned in the HPI and as above.  Physical Exam: General: Alert, no acute distress Cardiovascular: No pedal edema Respiratory: No cyanosis, no use of accessory musculature GI: No organomegaly, abdomen is soft and non-tender Skin: No lesions in the area of chief complaint Neurologic: Sensation intact distally Psychiatric: Patient is competent for consent with normal mood and affect Lymphatic: No axillary or cervical lymphadenopathy  MUSCULOSKELETAL: Examination of the left lower extremity reveals that he is in a posterior splint.  He is able to wiggle his toes.  Brisk capillary refill.  He reports intact sensation.  Assessment: Comminuted left distal tib-fib fracture. Tobacco abuse.  Plan: I discussed the findings with the patient and his family.  He has a complex injury. He is going to require staged treatment.  Tonight, we will need to place a spanning external fixator in order to improve limb alignment.  We will then plan for admission at [facility], and he will need to undergo definitive fixation in the future.  We will plan to  involve appropriate ortho trauma / foot & ankle colleagues.  The risks, benefits, and alternatives were discussed with the patient. There are risks associated with the surgery including, but not limited to, problems with anesthesia (death), infection, differences in leg length/angulation/rotation, fracture of bones, loosening or failure of implants, malunion, nonunion, hematoma (blood accumulation) which may require surgical drainage, blood clots, pulmonary embolism, nerve injury (foot drop), and blood vessel injury. The patient understands these risks and elects to proceed.   Jonette Pesa, MD Cell [phone]    11/07/2018 3:18 AM

## 2018-11-07 NOTE — Progress Notes (Signed)
Patient being transported to CT. This RN will accompany.

## 2018-11-07 NOTE — Progress Notes (Signed)
   Subjective: Day of Surgery Procedure(s) (LRB): EXTERNAL FIXATION LEFT LOWER LEG (Left) Patient reports pain as moderate.   Patient seen in rounds for Dr. Linna Caprice. Patient is stable and resting in PACU. No issues since surgery.    Objective: Vital signs in last 24 hours: Temp:  [97.6 F (36.4 C)-98.5 F (36.9 C)] 97.8 F (36.6 C) (01/05 0800) Pulse Rate:  [81-101] 83 (01/05 0800) Resp:  [16-23] 17 (01/05 0800) BP: (120-150)/(76-106) 122/76 (01/05 0800) SpO2:  [97 %-100 %] 99 % (01/05 0800) Weight:  [115.7 kg] 115.7 kg (01/05 0040)  Intake/Output from previous day:  Intake/Output Summary (Last 24 hours) at 11/07/2018 0826 Last data filed at 11/07/2018 0655 Gross per 24 hour  Intake 1050 ml  Output 920 ml  Net 130 ml     Labs: Recent Labs    11/07/18 0225  HGB 11.3*   Recent Labs    11/07/18 0225  WBC 4.2  RBC 3.72*  HCT 32.9*  PLT 80*   Recent Labs    11/07/18 0225  NA 126*  K 3.4*  CL 88*  CO2 22  BUN 6  CREATININE 0.85  GLUCOSE 107*  CALCIUM 8.2*    EXAM General - Patient is Appropriate Extremity - Neurologically intact Sensation intact distally Compartment soft Dressing/Incision - clean, dry Motor Function - intact, moving toes well on exam.   Past Medical History:  Diagnosis Date  . Diabetes mellitus   . Essential hypertension   . Gastritis   . Gout     Assessment/Plan: Day of Surgery Procedure(s) (LRB): EXTERNAL FIXATION LEFT LOWER LEG (Left) Active Problems:   Closed fracture of left distal tibia  Estimated body mass index is 29.47 kg/m as calculated from the following:   Height as of this encounter: 6\' 6"  (1.981 m).   Weight as of this encounter: 115.7 kg.   DVT Prophylaxis - Lovenox Nonweight-bearing left LE  Plan for transfer to [facility] once bed available CT left ankle for surgical planning Will get bed at [facility] for time being  Dimitri Ped, PA-C Orthopaedic Surgery 11/07/2018, 8:26 AM

## 2018-11-07 NOTE — ED Notes (Signed)
Pt headed to OR.  

## 2018-11-07 NOTE — Anesthesia Postprocedure Evaluation (Signed)
Anesthesia Post Note  Patient: Richard Lopez  Procedure(s) Performed: EXTERNAL FIXATION LEFT LOWER LEG (Left )     Patient location during evaluation: PACU Anesthesia Type: General Level of consciousness: sedated Pain management: pain level controlled Vital Signs Assessment: post-procedure vital signs reviewed and stable Respiratory status: spontaneous breathing and patient connected to face mask oxygen Cardiovascular status: stable Postop Assessment: no apparent nausea or vomiting Anesthetic complications: no    Last Vitals:  Vitals:   11/07/18 0230 11/07/18 0300  BP: (!) 148/106 (!) 140/101  Pulse: 87 87  Resp: 20 16  Temp:    SpO2: 99% 99%    Last Pain:  Vitals:   11/07/18 0102  TempSrc:   PainSc: 10-Worst pain ever   Pain Goal:                 Caren Macadam

## 2018-11-07 NOTE — Anesthesia Procedure Notes (Addendum)
Procedure Name: Intubation Date/Time: 11/07/2018 3:57 AM Performed by: Niel Hummer, CRNA Pre-anesthesia Checklist: Patient identified, Emergency Drugs available, Suction available and Patient being monitored Patient Re-evaluated:Patient Re-evaluated prior to induction Oxygen Delivery Method: Circle system utilized Preoxygenation: Pre-oxygenation with 100% oxygen Induction Type: IV induction and Rapid sequence Laryngoscope Size: Mac and 4 Grade View: Grade I Number of attempts: 1 Airway Equipment and Method: Stylet Placement Confirmation: ETT inserted through vocal cords under direct vision,  positive ETCO2 and breath sounds checked- equal and bilateral Secured at: 23 cm Tube secured with: Tape Dental Injury: Teeth and Oropharynx as per pre-operative assessment  Comments: Lower teeth loose prior to case, no change after intubation

## 2018-11-07 NOTE — Transfer of Care (Signed)
Immediate Anesthesia Transfer of Care Note  Patient: Richard Lopez  Procedure(s) Performed: EXTERNAL FIXATION LEFT LOWER LEG (Left )  Patient Location: PACU  Anesthesia Type:General  Level of Consciousness: awake, alert  and oriented  Airway & Oxygen Therapy: Patient Spontanous Breathing and Patient connected to face mask oxygen  Post-op Assessment: Report given to RN and Post -op Vital signs reviewed and stable  Post vital signs: Reviewed and stable  Last Vitals:  Vitals Value Taken Time  BP 131/89 11/07/2018  5:11 AM  Temp    Pulse 87 11/07/2018  5:12 AM  Resp 26 11/07/2018  5:12 AM  SpO2 100 % 11/07/2018  5:12 AM  Vitals shown include unvalidated device data.  Last Pain:  Vitals:   11/07/18 0102  TempSrc:   PainSc: 10-Worst pain ever         Complications: No apparent anesthesia complications

## 2018-11-07 NOTE — ED Provider Notes (Signed)
[facility]-EMERGENCY DEPT Provider Note   CSN: [id number] Arrival date & time: 11/06/18  2330     History   Chief Complaint Chief Complaint  Patient presents with  . Ankle Injury    left    HPI Richard Lopez is a 43 y.o. male.  Patient with left lower leg pain after trying to get into a car.  States he stepped off a curb the wrong way and his foot inverted.  Did not hit head or lose consciousness.  No neck or back pain.  Pain is in his left distal leg.  No open wounds.  No focal weakness, numbness or tingling.  Denies any other medical problems or medication use.  The history is provided by the patient.  Ankle Injury  Pertinent negatives include no abdominal pain, no headaches and no shortness of breath.    Past Medical History:  Diagnosis Date  . Diabetes mellitus   . Essential hypertension   . Gastritis   . Gout     Patient Active Problem List   Diagnosis Date Noted  . AKI (acute kidney injury) (HCC) 04/02/2018  . Metabolic acidosis   . Diarrhea   . Acute kidney injury (HCC) 03/31/2018  . Hyponatremia 03/31/2018  . Prediabetes 04/02/2017  . Gastritis 04/02/2017  . Intractable nausea and vomiting 04/01/2017  . Marijuana abuse 04/01/2017  . ARF (acute renal failure) (HCC) 07/29/2014  . Nausea with vomiting 07/29/2014  . Essential hypertension 07/29/2014  . Nausea & vomiting 07/29/2014    Past Surgical History:  Procedure Laterality Date  . NO PAST SURGERIES          Home Medications    Prior to Admission medications   Medication Sig Start Date End Date Taking? Authorizing Provider  famotidine (PEPCID) 20 MG tablet Take 1 tablet (20 mg total) by mouth 2 (two) times daily as needed for heartburn or indigestion. 04/02/17   Calvert Cantor, MD  lisinopril-hydrochlorothiazide (ZESTORETIC) 10-12.5 MG tablet Take 1 tablet by mouth daily. 04/03/18 04/03/19  Calvert Cantor, MD    Family History Family History  Problem Relation Age of Onset  .  Diabetes Mellitus II Father   . Diabetes Mellitus II Other   . CAD Other     Social History Social History   Tobacco Use  . Smoking status: Current Every Day Smoker    Packs/day: 0.00    Types: Cigarettes  . Smokeless tobacco: Never Used  . Tobacco comment: About 1 cigarette per day or less  Substance Use Topics  . Alcohol use: Yes    Alcohol/week: 200.0 standard drinks    Types: 200 Cans of beer per week    Comment: 2-3 40oz beers per week  . Drug use: Yes    Types: Marijuana    Comment: 3 times a week     Allergies   Patient has no known allergies.   Review of Systems Review of Systems  Constitutional: Negative for activity change, appetite change and fever.  HENT: Negative for congestion and rhinorrhea.   Eyes: Negative for visual disturbance.  Respiratory: Negative for cough, chest tightness and shortness of breath.   Gastrointestinal: Negative for abdominal pain, nausea and vomiting.  Genitourinary: Negative for dysuria.  Musculoskeletal: Positive for arthralgias and myalgias. Negative for back pain and neck pain.  Skin: Negative for rash.  Neurological: Negative for dizziness, weakness and headaches.   all other systems are negative except as noted in the HPI and PMH.  Physical Exam Updated Vital Signs BP 120/88 (BP Location: Left Arm)   Pulse (!) 101   Temp 97.6 F (36.4 C) (Oral)   Resp 18   Ht 6\' 6"  (1.981 m)   Wt 115.7 kg   SpO2 100%   BMI 29.47 kg/m   Physical Exam Vitals signs and nursing note reviewed.  Constitutional:      General: He is not in acute distress.    Appearance: He is well-developed.  HENT:     Head: Normocephalic and atraumatic.     Mouth/Throat:     Pharynx: No oropharyngeal exudate.  Eyes:     Conjunctiva/sclera: Conjunctivae normal.     Pupils: Pupils are equal, round, and reactive to light.  Neck:     Musculoskeletal: Normal range of motion and neck supple.     Comments: No meningismus. Cardiovascular:      Rate and Rhythm: Normal rate and regular rhythm.     Heart sounds: Normal heart sounds. No murmur.  Pulmonary:     Effort: Pulmonary effort is normal. No respiratory distress.     Breath sounds: Normal breath sounds.  Abdominal:     Palpations: Abdomen is soft.     Tenderness: There is no abdominal tenderness. There is no guarding or rebound.  Musculoskeletal: Normal range of motion.        General: Swelling, tenderness, deformity and signs of injury present.     Comments: Tenderness, swelling, deformity to left lower leg. Intact DP pulse.  Compartments are soft.  Diffuse tenderness to lower leg and ankle with deformity and crepitance present.  Knee is nontender.  Skin:    General: Skin is warm.  Neurological:     Mental Status: He is alert and oriented to person, place, and time.     Cranial Nerves: No cranial nerve deficit.     Motor: No abnormal muscle tone.     Coordination: Coordination normal.     Comments:  5/5 strength throughout. CN 2-12 intact.Equal grip strength.   Psychiatric:        Behavior: Behavior normal.      ED Treatments / Results  Labs (all labs ordered are listed, but only abnormal results are displayed) Labs Reviewed  CBC WITH DIFFERENTIAL/PLATELET - Abnormal; Notable for the following components:      Result Value   RBC 3.72 (*)    Hemoglobin 11.3 (*)    HCT 32.9 (*)    Platelets 80 (*)    Neutro Abs 1.0 (*)    All other components within normal limits  BASIC METABOLIC PANEL - Abnormal; Notable for the following components:   Sodium 126 (*)    Potassium 3.4 (*)    Chloride 88 (*)    Glucose, Bld 107 (*)    Calcium 8.2 (*)    Anion gap 16 (*)    All other components within normal limits  CBC  CREATININE, SERUM    EKG None  Radiology Dg Ankle 2 Views Left  Result Date: 11/07/2018 CLINICAL DATA:  External fixation lower extremity EXAM: LEFT ANKLE - 2 VIEW COMPARISON:  Same day FINDINGS: Multiple C-arm images show placement of external  fixation screws in the proximal tibia in the calcaneus. Comminuted distal tibial and fibular fractures with only mild angulation. IMPRESSION: External fixation for comminuted distal tibial and fibular fractures. Electronically Signed   By: Paulina FusiMark  Shogry M.D.   On: 11/07/2018 06:46   Dg Ankle Complete Left  Result Date: 11/07/2018 CLINICAL DATA:  Left  ankle pain and swelling after injury. Patient reports stepping off a curb. EXAM: LEFT ANKLE COMPLETE - 3+ VIEW COMPARISON:  None. FINDINGS: Comminuted and displaced fracture of the distal tibial metadiaphysis with apex lateral angulation. Oblique distal fibular fracture is displaced with lateral angulation. No intra-articular extension. Ankle mortise is preserved. 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 Soft tissue edema noted about the ankle. IMPRESSION: Comminuted, displaced, and angulated distal tibial and fibular fractures. No ankle joint extension. Electronically Signed   By: Narda Rutherford M.D.   On: 11/07/2018 01:42   Ct Ankle Left Wo Contrast  Result Date: 11/07/2018 CLINICAL DATA:  Status post external fixation of left ankle fracture. Postoperative CT. Initial encounter. EXAM: CT OF THE LEFT ANKLE WITHOUT CONTRAST TECHNIQUE: Multidetector CT imaging of the left ankle was performed according to the standard protocol. Multiplanar CT image reconstructions were also generated. COMPARISON:  Left ankle radiographs performed earlier today at 1:17 a.m. FINDINGS: Bones/Joint/Cartilage There is a significantly comminuted fracture involving the distal tibial diaphysis and metaphysis, with mild inferior angulation of an anterior fragment. Much of the distal tibia is anatomically aligned. A fracture line is seen extending to the medial edge of the tibial plafond, without significant step-off. There is also a minimally displaced fracture at the distal fibular diaphysis. The interosseous space appears grossly preserved. There is slight  medial tilt of the talus. Prominent subcortical cysts and degenerative change are noted at the distal fibula, and medial and anterior aspects of the talus. There is beaking of the anterior talus. Two os trigonum fragments are noted, with adjacent degenerative change. Ligaments Suboptimally assessed by CT. Muscles and Tendons The visualized musculature is grossly unremarkable in appearance. Visualized tendon structures are grossly unremarkable, though not fully assessed. Soft tissues Diffuse soft tissue injury is seen tracking along the left lower leg and ankle. IMPRESSION: 1. Significantly comminuted fracture involving the distal tibial diaphysis and metaphysis, with mild inferior angulation of an anterior fragment. Fracture line extends to the medial edge of the tibial plafond, without significant step-off. Much of the distal tibia is anatomically aligned. 2. Minimally displaced fracture at the distal fibular diaphysis. 3. Diffuse soft tissue injury tracking along the left lower leg and ankle. 4. Degenerative change noted about the ankle. Two os trigonum fragments noted. Electronically Signed   By: Roanna Raider M.D.   On: 11/07/2018 06:37   Dg Tibia/fibula Left Port  Result Date: 11/07/2018 CLINICAL DATA:  Preoperative radiographs prior to external fixation of tibial and fibular fractures. Initial encounter. EXAM: PORTABLE LEFT TIBIA AND FIBULA - 2 VIEW COMPARISON:  Left ankle radiographs performed earlier today at 1:17 a.m. FINDINGS: The comminuted fracture of the distal tibial metadiaphysis, and displaced fracture of the distal fibula, are grossly unchanged in appearance, with surrounding soft tissue swelling. There appears to be extension to the tibial plafond. Mild medial and posterior angulation are noted. There is also a mildly displaced fracture of the proximal fibular diaphysis. A prominent knee joint effusion is suspected. Evaluation of the soft tissues is mildly suboptimal due to the overlying  splint. IMPRESSION: 1. Comminuted fracture of the distal tibial metadiaphysis, and displaced fracture of the distal fibula, are grossly unchanged in appearance. There appears to be extension to the tibial plafond. Mild medial and posterior angulation noted. 2. Mildly displaced fracture of the proximal fibular diaphysis. 3. Suspect prominent knee joint effusion. Electronically Signed   By: Roanna Raider M.D.   On: 11/07/2018 03:15   Dg C-arm 1-60 Min-no  Report  Result Date: 11/07/2018 Fluoroscopy was utilized by the requesting physician.  No radiographic interpretation.    Procedures Procedures (including critical care time)  Medications Ordered in ED Medications  HYDROmorphone (DILAUDID) injection 0.5 mg (has no administration in time range)     Initial Impression / Assessment and Plan / ED Course  I have reviewed the triage vital signs and the nursing notes.  Pertinent labs & imaging results that were available during my care of the patient were reviewed by me and considered in my medical decision making (see chart for details).    Patient with left lower leg fracture after stepping off of a curb the wrong way.  Neurovascularly intact. No head or neck injury.  X-ray shows comminuted and displaced distal tibial and fibular fractures.  Ankle mortise is preserved. Intact DP and PT pulse.  Complex fracture discussed with Dr. Linna CapriceSwinteck of orthopedics.  He reviewed Xray and plans to take patient to the operating room tonight for an external fixator. We will splint temporarily, treat pain, elevate. Obtain preop labs. Patient updated.    Final Clinical Impressions(s) / ED Diagnoses   Final diagnoses:  Fracture  Tibia/fibula fracture, left, closed, initial encounter    ED Discharge Orders    None       Addam Goeller, Jeannett SeniorStephen, MD 11/07/18 [phone]

## 2018-11-07 NOTE — Op Note (Signed)
OPERATIVE REPORT   11/07/2018  4:56 AM  PATIENT:  Richard Lopez   SURGEON:  Jonette Pesa, MD  ASSISTANT:  Staff.   PREOPERATIVE DIAGNOSIS: Closed comminuted left distal tib-fib fracture.  POSTOPERATIVE DIAGNOSIS:  Same.  PROCEDURE: Placement of spanning uniplane external fixator, left lower extremity.  ANESTHESIA:   GETA.  ANTIBIOTICS: 3 g Ancef.  IMPLANTS: Zimmer Xtrafix.  SPECIMENS: None.  COMPLICATIONS: None.  DISPOSITION: Stable to PACU.  SURGICAL INDICATIONS:  Richard Lopez is a 43 y.o. male with a diagnosis of left distal tibia fracture.  The fracture is length unstable.  Patient was indicated for placement of spanning external fixator.  He will need staged definitive fixation in the future.  The risks, benefits, and alternatives were discussed with the patient preoperatively including but not limited to the risks of infection, bleeding, nerve / blood vessel injury, malunion, nonunion, cardiopulmonary complications, the need for repeat surgery, among others, and the patient was willing to proceed.  PROCEDURE IN DETAIL: Identified the patient in the holding area using 2 identifiers.  The surgical site was marked by myself.  He was taken the operating room, and general anesthesia was induced on his stretcher.  He was then transferred to the operating room table.  All bony prominences well-padded.  He was positioned on a bone foam positioner.  The left lower extremity was prepped and draped in the normal sterile surgical fashion.  Timeout was called, verifying site and site of surgery.  He did receive IV antibiotics within 60 minutes begin the procedure.  I began by placing 2 anterior to posterior 5 mm external fixator pins just medial to the anterior tibial crest, about a handbreadth distal to the tibial tubercle through separate stab incisions.  Pins were placed bicortically.  I then obtained a lateral view of the calcaneus.  Longitudinal incision was made over the  medial aspect of the calcaneal tuberosity.  Hemostat was used to spread down to bone.  A pin was placed from medial to lateral in the distal/posterior quadrant of the body.  The external fixator was assembled.  The fracture was reduced with traction and rotation.  The construct was tightened.  I placed an additional bar to bar connector to stiffen the construct.  Final AP and lateral fluoroscopy views were used to confirm pin placement and reduction of the fracture.  Length and alignment are acceptable.  The patient was then extubated and taken to the PACU in stable condition.  Sponge, needle, and instrument counts were correct at the end of the case x2.  There were no known complications.  POSTOPERATIVE PLAN: Postoperatively, the patient will be admitted to [facility].  Nonweightbearing left lower extremity.  Ice, elevation.  Continue external fixator.  Begin Lovenox for DVT prophylaxis.  Obtain CT scan left ankle for surgical planning.  We will discuss patient with orthopedic trauma service.  He will require definitive surgical fixation.

## 2018-11-07 NOTE — Progress Notes (Signed)
Contacted patient placement, bed order placed. Patient moved to "on hold", currently bed awaiting beds on [facility]

## 2018-11-07 NOTE — Progress Notes (Signed)
Still awaiting bed at [facility], on surgical schedule for 1/6, consents signed and on chart,  patient will be NPO after midnight, LLE remains elevated, +pp, +csm, ace wrap and external fixator in place, medicated for pain per orders, tolerated some dinner, voiding QS, maintained on bedrest, vss, will continue to monitor.

## 2018-11-07 NOTE — ED Notes (Signed)
X-ray at bedside

## 2018-11-07 NOTE — Anesthesia Preprocedure Evaluation (Signed)
Anesthesia Evaluation    Reviewed: Allergy & Precautions, Patient's Chart, lab work & pertinent test results  Airway Mallampati: II       Dental  (+) Poor Dentition, Loose, Missing   Pulmonary Current Smoker,    Pulmonary exam normal breath sounds clear to auscultation       Cardiovascular hypertension, Normal cardiovascular exam Rhythm:Regular Rate:Normal     Neuro/Psych    GI/Hepatic negative GI ROS, Neg liver ROS,   Endo/Other  diabetes  Renal/GU      Musculoskeletal   Abdominal Normal abdominal exam  (+)   Peds  Hematology  (+) Blood dyscrasia, anemia ,   Anesthesia Other Findings   Reproductive/Obstetrics                             Anesthesia Physical  Anesthesia Plan  ASA: II  Anesthesia Plan: General   Post-op Pain Management:    Induction: Intravenous  PONV Risk Score and Plan: 2 and Ondansetron and Dexamethasone  Airway Management Planned: Oral ETT  Additional Equipment:   Intra-op Plan:   Post-operative Plan: Extubation in OR  Informed Consent:   Plan Discussed with:   Anesthesia Plan Comments:         Anesthesia Quick Evaluation

## 2018-11-07 NOTE — Progress Notes (Signed)
Patient discussed with Dr. Jena Gauss, Ortho trauma.  He requests transfer to [facility] today, NPO after MN for possible surgery tomorrow.

## 2018-11-07 NOTE — Anesthesia Preprocedure Evaluation (Signed)
Anesthesia Evaluation  Patient identified by MRN, date of birth, ID band Patient awake    Reviewed: Allergy & Precautions, NPO status , Patient's Chart, lab work & pertinent test results  Airway Mallampati: II       Dental  (+) Poor Dentition   Pulmonary Current Smoker,    Pulmonary exam normal breath sounds clear to auscultation       Cardiovascular hypertension, Normal cardiovascular exam Rhythm:Regular Rate:Normal     Neuro/Psych    GI/Hepatic negative GI ROS, Neg liver ROS,   Endo/Other  diabetes  Renal/GU      Musculoskeletal   Abdominal Normal abdominal exam  (+)   Peds  Hematology  (+) Blood dyscrasia, anemia ,   Anesthesia Other Findings   Reproductive/Obstetrics                             Anesthesia Physical Anesthesia Plan  ASA: II  Anesthesia Plan: General   Post-op Pain Management:    Induction: Rapid sequence and Cricoid pressure planned  PONV Risk Score and Plan: 2 and Ondansetron and Dexamethasone  Airway Management Planned: Oral ETT  Additional Equipment:   Intra-op Plan:   Post-operative Plan: Extubation in OR  Informed Consent: I have reviewed the patients History and Physical, chart, labs and discussed the procedure including the risks, benefits and alternatives for the proposed anesthesia with the patient or authorized representative who has indicated his/her understanding and acceptance.     Plan Discussed with: CRNA  Anesthesia Plan Comments:         Anesthesia Quick Evaluation

## 2018-11-07 NOTE — ED Notes (Signed)
Save tubes sent to lab. 

## 2018-11-07 NOTE — ED Notes (Addendum)
OR called. Will take pt after Xray done. Surgeon at bedside. Consent form signed. Wife is taking home all belongings.

## 2018-11-07 NOTE — Plan of Care (Signed)

## 2018-11-07 NOTE — ED Notes (Signed)
Bed: WTR8 Expected date:  Expected time:  Means of arrival:  Comments: 

## 2018-11-07 NOTE — ED Triage Notes (Signed)
Pt stated "I stepped off the curb.  I think my ankle is broken."  Pt presents with edema to left foot/ankle.  PP+.

## 2018-11-08 ENCOUNTER — Encounter (HOSPITAL_COMMUNITY)
Admission: EM | Disposition: A | Discharge status: Home / Self Care | Payer: Self-pay | Source: Home / Self Care | Attending: Orthopedic Surgery

## 2018-11-08 ENCOUNTER — Inpatient Hospital Stay (HOSPITAL_COMMUNITY): Payer: Self-pay | Admitting: Anesthesiology

## 2018-11-08 ENCOUNTER — Encounter (HOSPITAL_COMMUNITY): Payer: Self-pay | Admitting: Certified Registered Nurse Anesthetist

## 2018-11-08 ENCOUNTER — Inpatient Hospital Stay (HOSPITAL_COMMUNITY): Payer: Self-pay

## 2018-11-08 HISTORY — PX: EXTERNAL FIXATION REMOVAL: SHX5040

## 2018-11-08 HISTORY — PX: OPEN REDUCTION INTERNAL FIXATION (ORIF) TIBIA/FIBULA FRACTURE: SHX5992

## 2018-11-08 LAB — GLUCOSE, CAPILLARY
Glucose-Capillary: 97 mg/dL (ref 70–99)
Glucose-Capillary: 130 mg/dL — ABNORMAL HIGH (ref 70–99)

## 2018-11-08 LAB — MRSA PCR SCREENING: MRSA by PCR: NEGATIVE

## 2018-11-08 SURGERY — OPEN REDUCTION INTERNAL FIXATION (ORIF) TIBIA/FIBULA FRACTURE
Anesthesia: General | Site: Leg Lower | Laterality: Left

## 2018-11-08 MED ORDER — HYDROMORPHONE HCL 1 MG/ML IJ SOLN
INTRAMUSCULAR | Status: AC
  Filled 2018-11-08: qty 1

## 2018-11-08 MED ORDER — LIDOCAINE 2% (20 MG/ML) 5 ML SYRINGE
INTRAMUSCULAR | Status: AC
  Filled 2018-11-08: qty 5

## 2018-11-08 MED ORDER — BACITRACIN ZINC 500 UNIT/GM EX OINT
TOPICAL_OINTMENT | CUTANEOUS | Status: AC
  Filled 2018-11-08: qty 28.35

## 2018-11-08 MED ORDER — CEFAZOLIN SODIUM-DEXTROSE 2-4 GM/100ML-% IV SOLN
2.0000 g | Freq: Three times a day (TID) | INTRAVENOUS | Status: AC
  Administered 2018-11-08 – 2018-11-09 (×3): 2 g via INTRAVENOUS
  Filled 2018-11-08 (×3): qty 100

## 2018-11-08 MED ORDER — ROCURONIUM BROMIDE 10 MG/ML (PF) SYRINGE
PREFILLED_SYRINGE | INTRAVENOUS | Status: DC | PRN
  Administered 2018-11-08: 10 mg via INTRAVENOUS
  Administered 2018-11-08: 40 mg via INTRAVENOUS

## 2018-11-08 MED ORDER — ONDANSETRON HCL 4 MG/2ML IJ SOLN: 4.0000 mg | Freq: Once | INTRAMUSCULAR | Status: DC | PRN

## 2018-11-08 MED ORDER — 0.9 % SODIUM CHLORIDE (POUR BTL) OPTIME
TOPICAL | Status: DC | PRN
  Administered 2018-11-08: 1000 mL

## 2018-11-08 MED ORDER — LACTATED RINGERS IV SOLN
INTRAVENOUS | Status: DC
  Administered 2018-11-08 (×2): via INTRAVENOUS

## 2018-11-08 MED ORDER — SUCCINYLCHOLINE CHLORIDE 200 MG/10ML IV SOSY
PREFILLED_SYRINGE | INTRAVENOUS | Status: AC
  Filled 2018-11-08: qty 10

## 2018-11-08 MED ORDER — SODIUM CHLORIDE 0.9 % IV SOLN: INTRAVENOUS | Status: DC | PRN

## 2018-11-08 MED ORDER — DEXAMETHASONE SODIUM PHOSPHATE 10 MG/ML IJ SOLN
INTRAMUSCULAR | Status: AC
  Filled 2018-11-08: qty 1

## 2018-11-08 MED ORDER — MEPERIDINE HCL 50 MG/ML IJ SOLN: 6.2500 mg | INTRAMUSCULAR | Status: DC | PRN

## 2018-11-08 MED ORDER — SUCCINYLCHOLINE CHLORIDE 200 MG/10ML IV SOSY
PREFILLED_SYRINGE | INTRAVENOUS | Status: DC | PRN
  Administered 2018-11-08: 140 mg via INTRAVENOUS

## 2018-11-08 MED ORDER — ROCURONIUM BROMIDE 50 MG/5ML IV SOSY
PREFILLED_SYRINGE | INTRAVENOUS | Status: AC
  Filled 2018-11-08: qty 5

## 2018-11-08 MED ORDER — FENTANYL CITRATE (PF) 250 MCG/5ML IJ SOLN
INTRAMUSCULAR | Status: AC
  Filled 2018-11-08: qty 5

## 2018-11-08 MED ORDER — EPHEDRINE SULFATE-NACL 50-0.9 MG/10ML-% IV SOSY
PREFILLED_SYRINGE | INTRAVENOUS | Status: DC | PRN
  Administered 2018-11-08: 10 mg via INTRAVENOUS
  Administered 2018-11-08: 5 mg via INTRAVENOUS

## 2018-11-08 MED ORDER — ONDANSETRON HCL 4 MG/2ML IJ SOLN
INTRAMUSCULAR | Status: AC
  Filled 2018-11-08: qty 2

## 2018-11-08 MED ORDER — PHENYLEPHRINE 40 MCG/ML (10ML) SYRINGE FOR IV PUSH (FOR BLOOD PRESSURE SUPPORT)
PREFILLED_SYRINGE | INTRAVENOUS | Status: AC
  Filled 2018-11-08: qty 10

## 2018-11-08 MED ORDER — ONDANSETRON HCL 4 MG/2ML IJ SOLN
INTRAMUSCULAR | Status: DC | PRN
  Administered 2018-11-08: 4 mg via INTRAVENOUS

## 2018-11-08 MED ORDER — VANCOMYCIN HCL 1000 MG IV SOLR
INTRAVENOUS | Status: AC
  Filled 2018-11-08: qty 1000

## 2018-11-08 MED ORDER — SUGAMMADEX SODIUM 200 MG/2ML IV SOLN
INTRAVENOUS | Status: DC | PRN
  Administered 2018-11-08: 231.4 mg via INTRAVENOUS

## 2018-11-08 MED ORDER — CEFAZOLIN SODIUM-DEXTROSE 2-4 GM/100ML-% IV SOLN
2.0000 g | Freq: Three times a day (TID) | INTRAVENOUS | Status: DC
  Filled 2018-11-08: qty 100

## 2018-11-08 MED ORDER — HYDROMORPHONE HCL 1 MG/ML IJ SOLN
0.2500 mg | INTRAMUSCULAR | Status: DC | PRN
  Administered 2018-11-08 (×2): 0.5 mg via INTRAVENOUS

## 2018-11-08 MED ORDER — PHENYLEPHRINE 40 MCG/ML (10ML) SYRINGE FOR IV PUSH (FOR BLOOD PRESSURE SUPPORT)
PREFILLED_SYRINGE | INTRAVENOUS | Status: DC | PRN
  Administered 2018-11-08 (×5): 80 ug via INTRAVENOUS

## 2018-11-08 MED ORDER — GLYCOPYRROLATE PF 0.2 MG/ML IJ SOSY
PREFILLED_SYRINGE | INTRAMUSCULAR | Status: DC | PRN
  Administered 2018-11-08: .2 mg via INTRAVENOUS

## 2018-11-08 MED ORDER — HYDROMORPHONE HCL 1 MG/ML IJ SOLN
1.0000 mg | Freq: Once | INTRAMUSCULAR | Status: AC
  Administered 2018-11-08: 1 mg via INTRAVENOUS

## 2018-11-08 MED ORDER — MIDAZOLAM HCL 2 MG/2ML IJ SOLN
INTRAMUSCULAR | Status: DC | PRN
  Administered 2018-11-08: 2 mg via INTRAVENOUS

## 2018-11-08 MED ORDER — DEXAMETHASONE SODIUM PHOSPHATE 10 MG/ML IJ SOLN
INTRAMUSCULAR | Status: DC | PRN
  Administered 2018-11-08: 10 mg via INTRAVENOUS

## 2018-11-08 MED ORDER — PROPOFOL 10 MG/ML IV BOLUS
INTRAVENOUS | Status: DC | PRN
  Administered 2018-11-08: 200 mg via INTRAVENOUS

## 2018-11-08 MED ORDER — GLYCOPYRROLATE PF 0.2 MG/ML IJ SOSY
PREFILLED_SYRINGE | INTRAMUSCULAR | Status: AC
  Filled 2018-11-08: qty 1

## 2018-11-08 MED ORDER — CEFAZOLIN SODIUM-DEXTROSE 2-4 GM/100ML-% IV SOLN
INTRAVENOUS | Status: AC
  Filled 2018-11-08: qty 100

## 2018-11-08 MED ORDER — EPHEDRINE 5 MG/ML INJ
INTRAVENOUS | Status: AC
  Filled 2018-11-08: qty 10

## 2018-11-08 MED ORDER — MIDAZOLAM HCL 2 MG/2ML IJ SOLN
INTRAMUSCULAR | Status: AC
  Filled 2018-11-08: qty 2

## 2018-11-08 MED ORDER — VANCOMYCIN HCL 500 MG IV SOLR
INTRAVENOUS | Status: AC
  Filled 2018-11-08: qty 500

## 2018-11-08 MED ORDER — PROPOFOL 10 MG/ML IV BOLUS
INTRAVENOUS | Status: AC
  Filled 2018-11-08: qty 20

## 2018-11-08 MED ORDER — FENTANYL CITRATE (PF) 250 MCG/5ML IJ SOLN
INTRAMUSCULAR | Status: DC | PRN
  Administered 2018-11-08: 50 ug via INTRAVENOUS
  Administered 2018-11-08: 150 ug via INTRAVENOUS
  Administered 2018-11-08: 50 ug via INTRAVENOUS
  Administered 2018-11-08: 100 ug via INTRAVENOUS
  Administered 2018-11-08 (×2): 50 ug via INTRAVENOUS

## 2018-11-08 MED ORDER — LIDOCAINE 2% (20 MG/ML) 5 ML SYRINGE
INTRAMUSCULAR | Status: DC | PRN
  Administered 2018-11-08: 100 mg via INTRAVENOUS

## 2018-11-08 SURGICAL SUPPLY — 76 items
BANDAGE ACE 4X5 VEL STRL LF (GAUZE/BANDAGES/DRESSINGS) ×3 IMPLANT
BANDAGE ACE 6X5 VEL STRL LF (GAUZE/BANDAGES/DRESSINGS) ×3 IMPLANT
BANDAGE ELASTIC 4 VELCRO ST LF (GAUZE/BANDAGES/DRESSINGS) ×2 IMPLANT
BANDAGE ELASTIC 6 VELCRO ST LF (GAUZE/BANDAGES/DRESSINGS) ×2 IMPLANT
BANDAGE ESMARK 6X9 LF (GAUZE/BANDAGES/DRESSINGS) ×1 IMPLANT
BIT DRILL 2.5X110 QC LCP DISP (BIT) ×2 IMPLANT
BIT DRILL LCP QC 2X140 (BIT) ×2 IMPLANT
BLADE CLIPPER SURG (BLADE) IMPLANT
BNDG CMPR 9X6 STRL LF SNTH (GAUZE/BANDAGES/DRESSINGS) ×1
BNDG COHESIVE 4X5 TAN STRL (GAUZE/BANDAGES/DRESSINGS) IMPLANT
BNDG ESMARK 6X9 LF (GAUZE/BANDAGES/DRESSINGS) ×3
BNDG GAUZE ELAST 4 BULKY (GAUZE/BANDAGES/DRESSINGS) ×3 IMPLANT
BRUSH SCRUB SURG 4.25 DISP (MISCELLANEOUS) ×6 IMPLANT
CHLORAPREP W/TINT 26ML (MISCELLANEOUS) ×5 IMPLANT
COVER MAYO STAND STRL (DRAPES) ×3 IMPLANT
COVER WAND RF STERILE (DRAPES) ×3 IMPLANT
DRAPE C-ARM 42X72 X-RAY (DRAPES) ×3 IMPLANT
DRAPE C-ARMOR (DRAPES) ×3 IMPLANT
DRAPE HALF SHEET 40X57 (DRAPES) ×6 IMPLANT
DRAPE INCISE IOBAN 66X45 STRL (DRAPES) ×3 IMPLANT
DRAPE U-SHAPE 47X51 STRL (DRAPES) ×3 IMPLANT
DRSG ADAPTIC 3X8 NADH LF (GAUZE/BANDAGES/DRESSINGS) ×3 IMPLANT
DRSG PAD ABDOMINAL 8X10 ST (GAUZE/BANDAGES/DRESSINGS) ×12 IMPLANT
ELECT REM PT RETURN 9FT ADLT (ELECTROSURGICAL) ×3
ELECTRODE REM PT RTRN 9FT ADLT (ELECTROSURGICAL) ×1 IMPLANT
GAUZE SPONGE 4X4 12PLY STRL (GAUZE/BANDAGES/DRESSINGS) ×3 IMPLANT
GLOVE BIO SURGEON STRL SZ 6.5 (GLOVE) ×5 IMPLANT
GLOVE BIO SURGEON STRL SZ7.5 (GLOVE) ×10 IMPLANT
GLOVE BIO SURGEONS STRL SZ 6.5 (GLOVE) ×2
GLOVE BIOGEL PI IND STRL 6.5 (GLOVE) ×1 IMPLANT
GLOVE BIOGEL PI IND STRL 7.5 (GLOVE) ×1 IMPLANT
GLOVE BIOGEL PI INDICATOR 6.5 (GLOVE) ×2
GLOVE BIOGEL PI INDICATOR 7.5 (GLOVE) ×2
GLOVE PROGUARD SZ 7 1/2 (GLOVE) ×3 IMPLANT
GOWN STRL REUS W/ TWL LRG LVL3 (GOWN DISPOSABLE) ×2 IMPLANT
GOWN STRL REUS W/TWL LRG LVL3 (GOWN DISPOSABLE) ×6
KIT BASIN OR (CUSTOM PROCEDURE TRAY) ×3 IMPLANT
KIT TURNOVER KIT B (KITS) ×3 IMPLANT
MANIFOLD NEPTUNE II (INSTRUMENTS) ×1 IMPLANT
NS IRRIG 1000ML POUR BTL (IV SOLUTION) ×3 IMPLANT
PACK TOTAL JOINT (CUSTOM PROCEDURE TRAY) ×3 IMPLANT
PAD ARMBOARD 7.5X6 YLW CONV (MISCELLANEOUS) ×6 IMPLANT
PAD CAST 4YDX4 CTTN HI CHSV (CAST SUPPLIES) ×1 IMPLANT
PADDING CAST COTTON 4X4 STRL (CAST SUPPLIES) ×3
PADDING CAST COTTON 6X4 STRL (CAST SUPPLIES) ×3 IMPLANT
PLATE LOCK DISTAL LT 232 12H (Plate) ×2 IMPLANT
SCREW CORTEX 3.5X50MM LP (Screw) ×2 IMPLANT
SCREW CORTEX LOW PRO 3.5X30 (Screw) ×6 IMPLANT
SCREW CORTEX LOW PRO 3.5X32 (Screw) ×2 IMPLANT
SCREW LOCK SELF-TAP 2.7X52MM (Screw) ×4 IMPLANT
SCREW LOCKING 2.7X56MM (Screw) ×2 IMPLANT
SCREW LOCKING 2.7X58 T8 (Screw) ×2 IMPLANT
SCREW LOCKING VA 2.7X54MM (Screw) ×4 IMPLANT
SPLINT PLASTER CAST XFAST 5X30 (CAST SUPPLIES) IMPLANT
SPLINT PLASTER XFAST SET 5X30 (CAST SUPPLIES) ×2
SPONGE LAP 18X18 X RAY DECT (DISPOSABLE) IMPLANT
STAPLER VISISTAT 35W (STAPLE) ×3 IMPLANT
STOCKINETTE IMPERVIOUS LG (DRAPES) IMPLANT
SUCTION FRAZIER HANDLE 10FR (MISCELLANEOUS) ×2
SUCTION TUBE FRAZIER 10FR DISP (MISCELLANEOUS) ×1 IMPLANT
SUT ETHILON 3 0 PS 1 (SUTURE) ×4 IMPLANT
SUT MNCRL AB 3-0 PS2 18 (SUTURE) ×1 IMPLANT
SUT PROLENE 0 CT (SUTURE) IMPLANT
SUT VIC AB 0 CT1 27 (SUTURE) ×3
SUT VIC AB 0 CT1 27XBRD ANBCTR (SUTURE) ×1 IMPLANT
SUT VIC AB 1 CT1 27 (SUTURE)
SUT VIC AB 1 CT1 27XBRD ANBCTR (SUTURE) ×1 IMPLANT
SUT VIC AB 2-0 CT1 27 (SUTURE) ×3
SUT VIC AB 2-0 CT1 TAPERPNT 27 (SUTURE) ×2 IMPLANT
TOWEL OR 17X24 6PK STRL BLUE (TOWEL DISPOSABLE) ×3 IMPLANT
TOWEL OR 17X26 10 PK STRL BLUE (TOWEL DISPOSABLE) ×4 IMPLANT
TRAY FOLEY MTR SLVR 16FR STAT (SET/KITS/TRAYS/PACK) IMPLANT
TUBE CONNECTING 12'X1/4 (SUCTIONS) ×1
TUBE CONNECTING 12X1/4 (SUCTIONS) ×2 IMPLANT
WATER STERILE IRR 1000ML POUR (IV SOLUTION) ×4 IMPLANT
YANKAUER SUCT BULB TIP NO VENT (SUCTIONS) ×3 IMPLANT

## 2018-11-08 NOTE — Consult Note (Signed)
Orthopaedic Trauma Service (OTS) Consult   Patient ID: Richard Lopez MRN: [id number] DOB/AGE: 42/06/1976 42 y.o.  Reason for Consult: Left tibia fracture Referring Physician: Dr. Samson FredericBrian Swinteck of [facility]  HPI: Richard Lopez is an 43 y.o. male who is being seen in consultation at the request of Dr. Linna CapriceSwinteck for evaluation of left tibial shaft fracture.  The patient was walking when he landed in a ditch.  He had immediate pain and deformity.  He was presented to the emergency room where x-rays showed a distal tibial shaft fracture with a possible intra-articular extension.  He was taken urgently for closed reduction and external fixation by Dr. Linna CapriceSwinteck.  Due to the complexity of his injury Dr. Linna CapriceSwinteck felt that it was outside the scope of practice and felt that he would be best treated by an orthopedic traumatologist.  Patient was evaluated in the preoperative holding area.  States that he ambulates without assist device.  He is a part-time [employer] teacher.  He lives alone but has a girlfriend that stays with him often.  He lives on the first floor with minimal steps to get in.  Denies any problems with his ankle before he has history of twisting his ankle and injuring it while playing sports but otherwise has not had any problems recently.  Denies any numbness or tingling.  Pain is currently well controlled.  Past Medical History:  Diagnosis Date  . Anxiety   . 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 Depression   . Diabetes mellitus    diet controlled  . Essential hypertension   . Gastritis   . Gout     Past Surgical History:  Procedure Laterality Date  . NO PAST SURGERIES      Family History  Problem Relation Age of Onset  . Diabetes Mellitus II Father   . Diabetes Mellitus II Other   . CAD Other     Social History:  reports that he has been smoking cigarettes. He has been smoking about 0.00 packs per day. He has never used smokeless tobacco. He reports  current alcohol use of about 200.0 standard drinks of alcohol per week. He reports current drug use. Drug: Marijuana.  Allergies: No Known Allergies  Medications:  No current facility-administered medications on file prior to encounter.    Current Outpatient Medications on File Prior to Encounter  Medication Sig Dispense Refill  . Melatonin 5 MG CAPS Take 5 mg by mouth at bedtime as needed (sleep).    . famotidine (PEPCID) 20 MG tablet Take 1 tablet (20 mg total) by mouth 2 (two) times daily as needed for heartburn or indigestion. (Patient not taking: Reported on 11/07/2018)    . lisinopril-hydrochlorothiazide (ZESTORETIC) 10-12.5 MG tablet Take 1 tablet by mouth daily. (Patient not taking: Reported on 11/07/2018) 30 tablet 11    ROS: Constitutional: No fever or chills Vision: No changes in vision ENT: No difficulty swallowing CV: No chest pain Pulm: No SOB or wheezing GI: No nausea or vomiting GU: No urgency or inability to hold urine Skin: No poor wound healing Neurologic: No numbness or tingling Psychiatric: No depression or anxiety Heme: No bruising Allergic: No reaction to medications or food   Exam: Blood pressure (!) 143/88, pulse 68, temperature 98.1 F (36.7 C), temperature source Oral, resp. rate 18, height 6\' 6"  (1.981 m), weight 115.7 kg, SpO2 100 %. General: No acute distress Orientation: Awake alert and oriented x3 Mood and Affect: Cooperative and pleasant Gait:  Unable to assess due to his fracture Coordination and balance: Within normal limits No increased work of breathing Regular rate and rhythm cardiovascularly  Left lower extremity: Ex-fix is in place the pin sites are clean dry and intact.  His left lower extremity is swollen but wrinkles slightly.  Compartments are soft and compressible.  Active dorsiflexion and plantarflexion of his toes.  Sensation intact light touch to the dorsum and plantar aspect of his foot.  Warm well-perfused foot with 2+ DP pulses.  No  obvious deformity about his knee or thigh.  Reflexes are not assessed.  No lymphadenopathy.  Right lower extremity: Skin without lesions. No tenderness to palpation. Full painless ROM, full strength in each muscle groups without evidence of instability.   Medical Decision Making: Imaging: X-rays of the left tibia and ankle along with CT scan are reviewed which shows a comminuted distal tibia fracture with intra-articular extension along with fracture of the distal fibula fracture.  There appears to be a benign cyst within his and metaphysis of his tibia on the CT scan although there is no periosteal reaction that was able to be visualized.  Labs:  Results for orders placed or performed during the hospital encounter of 11/07/18 (from the past 48 hour(s))  CBC with Differential/Platelet     Status: Abnormal   Collection Time: 11/07/18  2:25 AM  Result Value Ref Range   WBC 4.2 4.0 - 10.5 K/uL   RBC 3.72 (L) 4.22 - 5.81 MIL/uL   Hemoglobin 11.3 (L) 13.0 - 17.0 g/dL   HCT 59.4 (L) 70.7 - 61.5 %   MCV 88.4 80.0 - 100.0 fL   MCH 30.4 26.0 - 34.0 pg   MCHC 34.3 30.0 - 36.0 g/dL   RDW 18.3 43.7 - 35.7 %   Platelets 80 (L) 150 - 400 K/uL    Comment: REPEATED TO VERIFY PLATELET COUNT CONFIRMED BY SMEAR Immature Platelet Fraction may be clinically indicated, consider ordering this additional test [id number]    nRBC 0.0 0.0 - 0.2 %   Neutrophils Relative % 23 %   Neutro Abs 1.0 (L) 1.7 - 7.7 K/uL   Lymphocytes Relative 61 %   Lymphs Abs 2.5 0.7 - 4.0 K/uL   Monocytes Relative 14 %   Monocytes Absolute 0.6 0.1 - 1.0 K/uL   Eosinophils Relative 2 %   Eosinophils Absolute 0.1 0.0 - 0.5 K/uL   Basophils Relative 0 %   Basophils Absolute 0.0 0.0 - 0.1 K/uL   WBC Morphology MILD LEFT SHIFT (1-5% METAS, OCC MYELO, OCC BANDS)     Comment: TOXIC GRANULATION   Immature Granulocytes 0 %   Abs Immature Granulocytes 0.01 0.00 - 0.07 K/uL   Reactive, Benign Lymphocytes PRESENT     Comment: Performed  at [facility], [address], [place], [place] [address]  Basic metabolic panel     Status: Abnormal   Collection Time: 11/07/18  2:25 AM  Result Value Ref Range   Sodium 126 (L) 135 - 145 mmol/L   Potassium 3.4 (L) 3.5 - 5.1 mmol/L   Chloride 88 (L) 98 - 111 mmol/L   CO2 22 22 - 32 mmol/L   Glucose, Bld 107 (H) 70 - 99 mg/dL   BUN 6 6 - 20 mg/dL   Creatinine, Ser 8.13 0.61 - 1.24 mg/dL   Calcium 8.2 (L) 8.9 - 10.3 mg/dL   GFR calc non Af Amer >60 >60 mL/min   GFR calc Af Amer >60 >60 mL/min  Anion gap 16 (H) 5 - 15    Comment: Performed at [facility], [address] [place], [place] [address]  MRSA PCR Screening     Status: None   Collection Time: 11/08/18 10:55 AM  Result Value Ref Range   MRSA by PCR NEGATIVE NEGATIVE    Comment:        The GeneXpert MRSA Assay (FDA approved for NASAL specimens only), is one component of a comprehensive MRSA colonization surveillance program. It is not intended to diagnose MRSA infection nor to guide or monitor treatment for MRSA infections. Performed at [facility], [address] [place], [place] [address]   Glucose, capillary     Status: None   Collection Time: 11/08/18 11:46 AM  Result Value Ref Range   Glucose-Capillary 97 70 - 99 mg/dL    Medical history and chart was reviewed  Assessment/Plan: 43 year old male otherwise healthy with a left intra-articular distal tibia fracture status post external fixation.  I feel that proceeding with open reduction internal fixation is most appropriate.  Plan for medial plating.  Risks and benefits were discussed with the patient. Risks discussed included bleeding requiring blood transfusion, bleeding causing a hematoma, infection, malunion, nonunion, damage to surrounding nerves and blood vessels, pain, hardware prominence or irritation, hardware failure, stiffness, post-traumatic arthritis, DVT/PE, compartment syndrome, and even death.  The  patient agrees to proceed with surgery and consent was obtained.  I also did discuss with him the possibility of biopsy and bone grafting of the lesion.  Plan for admission postoperatively for a mobilization with physical therapy and possible discharge home on postoperative day 1 or 2.   Roby LoftsKevin P. Argenis Kumari, MD [facility] [phone] (phone)

## 2018-11-08 NOTE — Anesthesia Procedure Notes (Signed)
Procedure Name: Intubation Date/Time: 11/08/2018 12:55 PM Performed by: Mayer Camel, CRNA Pre-anesthesia Checklist: Patient identified, Emergency Drugs available, Suction available and Patient being monitored Patient Re-evaluated:Patient Re-evaluated prior to induction Oxygen Delivery Method: Circle System Utilized Preoxygenation: Pre-oxygenation with 100% oxygen Induction Type: IV induction Ventilation: Mask ventilation without difficulty Laryngoscope Size: Miller and 3 Grade View: Grade I Tube type: Oral Tube size: 7.5 mm Number of attempts: 1 Airway Equipment and Method: Stylet and Oral airway Placement Confirmation: ETT inserted through vocal cords under direct vision,  positive ETCO2 and breath sounds checked- equal and bilateral Secured at: 23 cm Tube secured with: Tape Dental Injury: Teeth and Oropharynx as per pre-operative assessment

## 2018-11-08 NOTE — Op Note (Signed)
Orthopaedic Surgery Operative Note (CSN: [id number] ) Date of Surgery: 11/08/2018  Admit Date: 11/07/2018   Diagnoses: Pre-Op Diagnoses: Left tibial shaft/pilon fracture   Post-Op Diagnosis: Same  Procedures: 1. CPT 27827-Open reduction internal fixation of left tibia fracture 3. CPT 20694-Removal of external fixation left ankle   Surgeons : Primary: Roby LoftsHaddix, Shaakira Borrero P, MD  Assistant: Ulyses SouthwardSarah Yacobi, PA-C  Location:OR 3   Anesthesia:General   Antibiotics: Ancef 2g preop   Tourniquet time:None   Estimated Blood Loss:50 mL  Complications:None  Specimens:None   Implants: Implant Name Type Inv. Item Serial No. Manufacturer Lot No. LRB No. Used Action  SCREW LOCK SELF-TAP 2.7X52MM - [id number] Screw SCREW LOCK SELF-TAP 2.7X52MM  SYNTHES [employer]UMA  Left 2 Implanted  SCREW LOCKING VA 2.7X54MM - [id number] Screw SCREW LOCKING VA 2.7X54MM  [employer]  Left 2 Implanted  SCREW LOCKING 2.7X58 T8 - [id number] Screw SCREW LOCKING 2.7X58 T8  [employer]  Left 1 Implanted  SCREW LOCKING 2.7X56MM - [id number] Screw SCREW LOCKING 2.7X56MM  [employer]ES [employer]UMA  Left 1 Implanted  SCREW CORTEX 3.5X50MM LP - [id number] Screw SCREW CORTEX 3.5X50MM LP  DEPUY SYNTHES  Left 1 Implanted  SCREW CORTEX LOW PRO 3.5X30 - [id number] Screw SCREW CORTEX LOW PRO 3.5X30  [employer]UMA  Left 3 Implanted  SCREW CORTEX LOW PRO 3.5X32 - [id number] Screw SCREW CORTEX LOW PRO 3.5X32  SY[employer] TRAUMA  Left 1 Implanted  12-Hole Plate    SYNTHES TRAUMA  Left 1 Implanted    Indications for Surgery: 43 year old male who landed in a ditch and twisted his leg had immediate pain and deformity was brought to the emergency room where x-rays showed a left distal tibia fracture with intra-articular extension.  He underwent external fixation by Dr. Linna CapriceSwinteck.  I felt that proceeding with open reduction internal fixation would be most appropriate.  Risks included but not limited to bleeding, infection, malunion, nonunion, posttraumatic  arthritis, stiffness, wound breakdown, need for hardware removal, hardware irritation, hardware failure, even the possibility of compartment syndrome and DVT.  The patient wished to proceed with surgery and consent was obtained.  Operative Findings: 1. Open reduction internal fixation of a left tibial shaft fracture with intra-articular extension using a Synthes 12 hole medial ankle trauma VA plate 2.  Removal of external fixator from left ankle.  Procedure: The patient was identified in the preoperative holding area. Consent was confirmed with the patient and their family and all questions were answered. The operative extremity was marked after confirmation with the patient. he was then brought back to the operating room by our anesthesia colleagues.  He was placed under general anesthetic and carefully transferred over to a radiolucent flat top table.  The external fixator was prepped. The operative extremity was then prepped and draped in usual sterile fashion. A preoperative timeout was performed to verify the patient, the procedure, and the extremity. Preoperative antibiotics were dosed.  Fluoroscopic images were obtained.  I remove the medial part of the external fixator.  I then chose a plate based on the length size.  I then marked out an incision.  I made about 4 cm incision just medial to the distal tibia.  I dissected down to the periosteum protecting the saphenous nerve and vein.  I then used a Cobb elevator to clear a path between the bone and the skin.  I then subcutaneously passed the plate along the medial border of the tibia.  Pinned in place carefully with a 1.6 mm K wire both distally and  proximally and obtained fluoroscopic images to confirm adequate placement of the plate.  A 3.5 millimeter screw was placed distally to bring the plate flush to bone.  Locking screws were placed distal to this to fix the plate distally to the tibia.  I then percutaneously placed a 3.5 mm nonlocking  screw in the tibial shaft.  I then placed 3 more screws in the tibial shaft.  A total of 7 screws were placed in the distal segment.  I remove the remainder of the external fixator.  I obtained final fluoroscopic imaging.  The incision was then copiously irrigated a gram of vancomycin powder was placed into the wound.  The incisions were then closed with 2-0 Vicryl and 3-0 nylon.  The ex-fix pin sites were then debrided and closed with 3-0 nylon.  The incisions were then dressed with bacitracin ointment, Adaptic, 4 x 4's and sterile cast padding.  A well padded short leg splint was then placed to his lower extremity.  The patient was then awoken from anesthesia and taken to the PACU in stable condition.  Post Op Plan/Instructions: Patient will be nonweightbearing to left lower extremity.  He received postoperative Ancef.  He will receive aspirin for DVT prophylaxis when he is discharged.  He will be on Lovenox for DVT prophylaxis while he is in the hospital.  I was present and performed the entire surgery.  Ulyses SouthwardSarah Yacobi, PA-C did assist me throughout the case. An assistant was necessary given the difficulty in approach, maintenance of reduction and ability to instrument the fracture.   Truitt MerleKevin Gray Doering, MD [facility]

## 2018-11-08 NOTE — Progress Notes (Signed)
PT Cancellation Note  Patient Details Name: Richard MemoryCarl D Junod MRN: [id number] DOB: 04/16/1976   Cancelled Treatment:    Reason Eval/Treat Not Completed: Other (comment)[facility].   Rada HayHill, Chloe Baig Elizabeth 11/08/2018, 7:02 AM  Blanchard KelchKaren Treazure Nery PT Acute Rehabilitation Services Pager [phone]

## 2018-11-08 NOTE — Progress Notes (Signed)
OT Cancellation Note  Patient Details Name: Richard Lopez MRN: [id number] DOB: 1975-11-20   Cancelled Treatment:    Reason Eval/Treat Not Completed: Patient at procedure or test/ unavailable(in OR for sx). OT will continue to follow acutely for evaluation.  Evern Bio Breiona Couvillon 11/08/2018, 12:05 PM   Sherryl Manges OTR/L Acute Rehabilitation Services Pager: [phone] Office: [phone]

## 2018-11-08 NOTE — Transfer of Care (Signed)
Immediate Anesthesia Transfer of Care Note  Patient: Richard Lopez  Procedure(s) Performed: OPEN REDUCTION INTERNAL FIXATION (ORIF) TIBIA/FIBULA FRACTURE (Left Leg Lower) REMOVAL EXTERNAL FIXATION LEG (Left Leg Lower)  Patient Location: PACU  Anesthesia Type:General  Level of Consciousness: awake, alert  and oriented  Airway & Oxygen Therapy: transported to PACU with oxygen via nasal cannula  Post-op Assessment: Report given to RN and Post -op Vital signs reviewed and stable  Post vital signs: Reviewed and stable  Last Vitals:  Vitals Value Taken Time  BP 144/92 11/08/2018  2:55 PM  Temp    Pulse 88 11/08/2018  3:02 PM  Resp 17 11/08/2018  3:02 PM  SpO2 95 % 11/08/2018  3:02 PM  Vitals shown include unvalidated device data.  Last Pain:  Vitals:   11/08/18 0837  TempSrc: Oral  PainSc:       Patients Stated Pain Goal: 2 (11/08/18 0800)  Complications: No apparent anesthesia complications

## 2018-11-08 NOTE — Progress Notes (Signed)
   Subjective:  Patient reports pain as moderate to severe.  Denies N/V/CP/SOB. C/o ankle pain.  Objective:   VITALS:   Vitals:   11/07/18 1402 11/07/18 2050 11/08/18 0225 11/08/18 0436  BP: (!) 150/92 (!) 157/96 133/85 (!) 142/89  Pulse: 80 76 75 70  Resp: 18 18 16 18   Temp: 97.8 F (36.6 C) 97.7 F (36.5 C) 98.2 F (36.8 C) 98 F (36.7 C)  TempSrc: Oral Oral Oral Oral  SpO2: 100% 100% 100% 100%  Weight:      Height:        NAD ABD soft Sensation intact distally Intact pulses distally Compartment soft Pin sites c/d/i No pain with passive ROM toes Fixator intact  Lab Results  Component Value Date   WBC 4.2 11/07/2018   HGB 11.3 (L) 11/07/2018   HCT 32.9 (L) 11/07/2018   MCV 88.4 11/07/2018   PLT 80 (L) 11/07/2018   BMET    Component Value Date/Time   NA 126 (L) 11/07/2018 0225   K 3.4 (L) 11/07/2018 0225   CL 88 (L) 11/07/2018 0225   CO2 22 11/07/2018 0225   GLUCOSE 107 (H) 11/07/2018 0225   BUN 6 11/07/2018 0225   CREATININE 0.85 11/07/2018 0225   CALCIUM 8.2 (L) 11/07/2018 0225   GFRNONAA >60 11/07/2018 0225   GFRAA >60 11/07/2018 0225     Assessment/Plan: 1 Day Post-Op   Active Problems:   Closed fracture of left distal tibia   Closed nondisplaced comminuted fracture of shaft of left tibia   NWB LLE DVT ppx: Lovenox, SCDs, TEDS PO pain control PT/OT NPO Dispo: [facility], Dr. Jena Gauss to take over   Iline Oven Bergen Magner 11/08/2018, 8:00 AM   Samson Frederic, MD Cell: [phone] [facility] is now [facility]  [place] [address], [place], [place] [address] Phone: [phone] [email or web address] Facebook  [employer]

## 2018-11-08 NOTE — Progress Notes (Signed)
Nutrition Brief Note  Patient identified on the Malnutrition Screening Tool (MST) Report. Patient reports no recent weight changes. He did not eat for 2 days last week because he was sick, but was feeling better PTA and eating well. No nutrition issues identified PTA. Nutrition focused physical exam completed.  No muscle or subcutaneous fat depletion noticed.   Wt Readings from Last 15 Encounters:  11/07/18 115.7 kg  03/31/18 102.1 kg  06/28/17 96.3 kg  04/02/17 119.3 kg  07/31/14 119.3 kg  04/27/12 111.6 kg    Body mass index is 29.47 kg/m. Patient meets criteria for overweight based on current BMI.   Current diet order is NPO for surgery today. Patient says he has a good appetite and can't wait to eat after his surgery today. Labs and medications reviewed.   No nutrition interventions warranted at this time. If nutrition issues arise, please consult RD.   Joaquin Courts, RD, LDN, CNSC Pager [phone] After Hours Pager [phone]

## 2018-11-08 NOTE — Progress Notes (Signed)
Bed approved at [facility], 5 north bed 30C, medical necessity form printed, chart prepared, carelinks called, and report called to 5 north. Patient received dilaudid at 0500, vss, friend currently at the bedside, will await transfer.

## 2018-11-09 ENCOUNTER — Encounter (HOSPITAL_COMMUNITY): Payer: Self-pay | Admitting: Orthopedic Surgery

## 2018-11-09 LAB — CBC
HCT: 23.8 % — ABNORMAL LOW (ref 39.0–52.0)
Hemoglobin: 8.1 g/dL — ABNORMAL LOW (ref 13.0–17.0)
MCH: 30.6 pg (ref 26.0–34.0)
MCHC: 34 g/dL (ref 30.0–36.0)
MCV: 89.8 fL (ref 80.0–100.0)
Platelets: 67 10*3/uL — ABNORMAL LOW (ref 150–400)
RBC: 2.65 MIL/uL — ABNORMAL LOW (ref 4.22–5.81)
RDW: 14 % (ref 11.5–15.5)
WBC: 4.9 10*3/uL (ref 4.0–10.5)
nRBC: 0 % (ref 0.0–0.2)

## 2018-11-09 NOTE — Evaluation (Signed)
Physical Therapy Evaluation Patient Details Name: Richard Lopez MRN: [id number] DOB: Nov 27, 1975 Today's Date: 11/09/2018   History of Present Illness  Pt is a 43 y/o male w/ Closed comminuted left distal tib-fib fracture after stepping off curb s/p ex fix 1/5 and ORIF 1/6. PMhx: Anxiety, Depression, Diabetes mellitus, HTN, Gastritis, and Gout  Clinical Impression  Pt very agreeable to mobility and able to state NWB and maintain throughout session. Pt lives alone and has equipment from prior session. Pt educated for gait, RW use and safety with transfers. Pt with decreased gait and transfers who will benefit from acute therapy to maximize mobility, function and safety for return home. Will plan to attempt stairs next session. Pt fatigued with gait this session and educated for need to have staff assist with mobility this session.     Follow Up Recommendations No PT follow up    Equipment Recommendations  Rolling walker with 5" wheels    Recommendations for Other Services       Precautions / Restrictions Restrictions LLE Weight Bearing: Non weight bearing      Mobility  Bed Mobility Overal bed mobility: Modified Independent                Transfers Overall transfer level: Needs assistance   Transfers: Sit to/from Stand Sit to Stand: Supervision         General transfer comment: cues for hand placement and RW positioning  Ambulation/Gait Ambulation/Gait assistance: Min guard Gait Distance (Feet): 80 Feet Assistive device: Rolling walker (2 wheeled) Gait Pattern/deviations: Step-to pattern   Gait velocity interpretation: 1.31 - 2.62 ft/sec, indicative of limited community ambulator General Gait Details: cues for position in RW with good tolerance NWB status  Stairs            Wheelchair Mobility    Modified [place] (Stroke Patients Only)       Balance Overall balance assessment: No apparent balance deficits (not formally assessed)                                            Pertinent Vitals/Pain Pain Assessment: No/denies pain    Home Living Family/patient expects to be discharged to:: Private residence Living Arrangements: Alone Available Help at Discharge: Friend(s);Available PRN/intermittently Type of Home: House Home Access: Stairs to enter Entrance Stairs-Rails: None Entrance Stairs-Number of Steps: 2 Home Layout: One level Home Equipment: Crutches;Walker - standard;Bedside commode;Shower seat;Tub bench Additional Comments: has equipment from prior knee injury    Prior Function Level of Independence: Independent               Hand Dominance        Extremity/Trunk Assessment   Upper Extremity Assessment Upper Extremity Assessment: Overall WFL for tasks assessed    Lower Extremity Assessment Lower Extremity Assessment: LLE deficits/detail LLE Deficits / Details: ankle limited by casting all other WFL    Cervical / Trunk Assessment Cervical / Trunk Assessment: Normal  Communication   Communication: No difficulties  Cognition Arousal/Alertness: Awake/alert Behavior During Therapy: WFL for tasks assessed/performed Overall Cognitive Status: Within Functional Limits for tasks assessed                                        General Comments      Exercises  Assessment/Plan    PT Assessment Patient needs continued PT services  PT Problem List Decreased mobility;Decreased activity tolerance;Decreased knowledge of use of DME;Decreased knowledge of precautions       PT Treatment Interventions Gait training;Therapeutic activities;Stair training;Therapeutic exercise;DME instruction;Functional mobility training;Patient/family education    PT Goals (Current goals can be found in the Care Plan section)  Acute Rehab PT Goals Patient Stated Goal: return home PT Goal Formulation: With patient Time For Goal Achievement: 11/16/18 Potential to Achieve Goals: Good     Frequency Min 5X/week   Barriers to discharge Decreased caregiver support      Co-evaluation               AM-PAC PT "6 Clicks" Mobility  Outcome Measure Help needed turning from your back to your side while in a flat bed without using bedrails?: None Help needed moving from lying on your back to sitting on the side of a flat bed without using bedrails?: None Help needed moving to and from a bed to a chair (including a wheelchair)?: A Little Help needed standing up from a chair using your arms (e.g., wheelchair or bedside chair)?: A Little Help needed to walk in hospital room?: A Little Help needed climbing 3-5 steps with a railing? : A Little 6 Click Score: 20    End of Session Equipment Utilized During Treatment: Gait belt Activity Tolerance: Patient tolerated treatment well Patient left: in chair;with call bell/phone within reach Nurse Communication: Mobility status PT Visit Diagnosis: Other abnormalities of gait and mobility (R26.89)    Time: [id number] PT Time Calculation (min) (ACUTE ONLY): 21 min   Charges:   PT Evaluation $PT Eval Moderate Complexity: 1 Mod          Elmond Poehlman Abner Greenspanabor Anisha Starliper, PT Acute Rehabilitation Services Pager: [phone] Office: [phone]   Aurielle Slingerland B Romuald Mccaslin 11/09/2018, 8:06 AM

## 2018-11-09 NOTE — Evaluation (Signed)
Occupational Therapy Evaluation Patient Details Name: Richard CroakCarl D Lopez MRN: [id number] DOB: 05/11/1976 Today's Date: 11/09/2018    History of Present Illness Pt is a 43 y/o male w/ Closed comminuted left distal tib-fib fracture after stepping off curb s/p ex fix 1/5 and ORIF 1/6. PMhx: Anxiety, Depression, Diabetes mellitus, HTN, Gastritis, and Gout   Clinical Impression   PTA, pt was living alone and was independent. Pt currently requiring Min Guard A for LB ADLs and functional mobility with RW. Pt able to maintain NWB status during ADLs. Providing education on compensatory techniques for LB dressing. Pt would benefit from further acute OT to facilitate safe dc. Recommend dc to home once medically stable per physician.      Follow Up Recommendations  No OT follow up;Supervision/Assistance - 24 hour    Equipment Recommendations  None recommended by OT    Recommendations for Other Services PT consult     Precautions / Restrictions Precautions Precautions: Fall Restrictions Weight Bearing Restrictions: Yes LLE Weight Bearing: Non weight bearing      Mobility Bed Mobility Overal bed mobility: Modified Independent                Transfers Overall transfer level: Needs assistance Equipment used: Rolling walker (2 wheeled) Transfers: Sit to/from Stand Sit to Stand: Supervision         General transfer comment: Supervision for safety    Balance Overall balance assessment: No apparent balance deficits (not formally assessed)                                         ADL either performed or assessed with clinical judgement   ADL Overall ADL's : Needs assistance/impaired Eating/Feeding: Independent;Sitting   Grooming: Set up;Supervision/safety;Sitting   Upper Body Bathing: Set up;Supervision/ safety;Sitting   Lower Body Bathing: Min guard;Sit to/from stand   Upper Body Dressing : Set up;Supervision/safety;Sitting   Lower Body Dressing: Min  guard;Sit to/from stand Lower Body Dressing Details (indicate cue type and reason): Provided education on compensatroy techniques for LB dressing. Min Guard A for safety in standing Toilet Transfer: Supervision/safety;Ambulation;RW           Functional mobility during ADLs: Supervision/safety;Rolling walker General ADL Comments: Pt demosntrating good balance and strength. However, pt fatigues quickly and requires standing rest breaks. Pt motivated to increased independence     Vision         Perception     Praxis      Pertinent Vitals/Pain Pain Assessment: Faces Faces Pain Scale: Hurts little more Pain Location: LLE at ankle and shin Pain Descriptors / Indicators: Constant;Discomfort Pain Intervention(s): Monitored during session;Limited activity within patient's tolerance;Repositioned     Hand Dominance     Extremity/Trunk Assessment Upper Extremity Assessment Upper Extremity Assessment: Overall WFL for tasks assessed   Lower Extremity Assessment Lower Extremity Assessment: Defer to PT evaluation;LLE deficits/detail LLE Deficits / Details: ankle limited by casting all other WFL   Cervical / Trunk Assessment Cervical / Trunk Assessment: Normal   Communication Communication Communication: No difficulties   Cognition Arousal/Alertness: Awake/alert Behavior During Therapy: WFL for tasks assessed/performed Overall Cognitive Status: Within Functional Limits for tasks assessed                                     General Comments  Exercises     Shoulder Instructions      Home Living Family/patient expects to be discharged to:: Private residence Living Arrangements: Alone Available Help at Discharge: Friend(s);Available PRN/intermittently(Girlfriend) Type of Home: House Home Access: Stairs to enter [employer] of Steps: 2 Entrance Stairs-Rails: None Home Layout: One level     Bathroom Shower/Tub: Contractor: Standard     Home Equipment: Crutches;Walker - standard;Bedside commode;Shower seat;Tub bench   Additional Comments: has equipment from prior knee injury      Prior Functioning/Environment Level of Independence: Independent        Comments: Was returning to work on Jan 10th        OT Problem List: Decreased activity tolerance;Impaired balance (sitting and/or standing);Decreased knowledge of use of DME or AE;Decreased knowledge of precautions;Pain      OT Treatment/Interventions: Self-care/ADL training;Therapeutic exercise;Energy conservation;DME and/or AE instruction;Therapeutic activities;Patient/family education    OT Goals(Current goals can be found in the care plan section) Acute Rehab OT Goals Patient Stated Goal: return home OT Goal Formulation: With patient Time For Goal Achievement: 11/23/18 Potential to Achieve Goals: Good ADL Goals Pt Will Perform Lower Body Dressing: with modified independence;sit to/from stand Pt Will Transfer to Toilet: with modified independence;bedside commode;ambulating Pt Will Perform Tub/Shower Transfer: Tub transfer;tub bench;rolling walker;ambulating;with modified independence  OT Frequency: Min 2X/week   Barriers to D/C: Decreased caregiver support  Support only PRN and three steps to get into home       Co-evaluation              AM-PAC OT "6 Clicks" Daily Activity     Outcome Measure Help from another person eating meals?: None Help from another person taking care of personal grooming?: A Little Help from another person toileting, which includes using toliet, bedpan, or urinal?: A Little Help from another person bathing (including washing, rinsing, drying)?: A Little Help from another person to put on and taking off regular upper body clothing?: None Help from another person to put on and taking off regular lower body clothing?: A Little 6 Click Score: 20   End of Session Equipment Utilized During Treatment:  Rolling walker;Gait belt Nurse Communication: Mobility status;Precautions  Activity Tolerance: Patient tolerated treatment well Patient left: in bed;with call bell/phone within reach  OT Visit Diagnosis: Unsteadiness on feet (R26.81);Other abnormalities of gait and mobility (R26.89);Muscle weakness (generalized) (M62.81);Pain Pain - Right/Left: Left Pain - part of body: Leg;Ankle and joints of foot                Time: 3545-6256 OT Time Calculation (min): 23 min Charges:  OT General Charges $OT Visit: 1 Visit OT Evaluation $OT Eval Moderate Complexity: 1 Mod OT Treatments $Self Care/Home Management : 8-22 mins  Adna Nofziger MSOT, OTR/L Acute Rehab Pager: [phone] Office: [phone]  Theodoro Grist Aelyn Stanaland 11/09/2018, 11:23 AM

## 2018-11-09 NOTE — Progress Notes (Signed)
Orthopaedic Trauma Progress Note  S: Pain okay, doing well. Up with therapy this AM  O:  Vitals:   11/08/18 2014 11/09/18 0549  BP: 127/79 128/85  Pulse: 74 70  Resp: 16 18  Temp: 97.9 F (36.6 C) 98.5 F (36.9 C)  SpO2: 99% 100%    LLE: Splint clean, dry and intact. Compartments soft and compressible. Wiggles toes. Sensation intact.  Imaging: Stable postop imaging  Labs:  Results for orders placed or performed during the hospital encounter of 11/07/18 (from the past 24 hour(s))  Glucose, capillary     Status: Abnormal   Collection Time: 11/08/18  2:59 PM  Result Value Ref Range   Glucose-Capillary 130 (H) 70 - 99 mg/dL  CBC     Status: Abnormal   Collection Time: 11/09/18  3:12 AM  Result Value Ref Range   WBC 4.9 4.0 - 10.5 K/uL   RBC 2.65 (L) 4.22 - 5.81 MIL/uL   Hemoglobin 8.1 (L) 13.0 - 17.0 g/dL   HCT 51.7 (L) 61.6 - 07.3 %   MCV 89.8 80.0 - 100.0 fL   MCH 30.6 26.0 - 34.0 pg   MCHC 34.0 30.0 - 36.0 g/dL   RDW 71.0 62.6 - 94.8 %   Platelets 67 (L) 150 - 400 K/uL   nRBC 0.0 0.0 - 0.2 %    Assessment: 43 year old male s/p fall  Injuries: Left tibia/pilon fracture s/p ORIF  Weightbearing: NWB LLE  Insicional and dressing care: Splint clean, dry and intact  Orthopedic device(s):Splint  CV/Blood loss:Acute blood loss anemia, Hgb 8.1. Recheck tomorrow  Pain management: 1. Dilaudid 0.5-1mg  q 4 hours 2. Oxycodone 5-15 mg q 4 hours PRN  VTE prophylaxis: Lovenox 40mg  while in hospital  ID: Ancef postop  Foley/Lines: None needed, KVO IVF  Medical co-morbidities: None  Dispo: PT/OT eval, likely home tomorrow  Follow - up plan: 2 weeks   Roby Lofts, MD [facility] [phone] (phone)

## 2018-11-09 NOTE — Anesthesia Postprocedure Evaluation (Signed)
Anesthesia Post Note  Patient: Richard Lopez  Procedure(s) Performed: OPEN REDUCTION INTERNAL FIXATION (ORIF) TIBIA/FIBULA FRACTURE (Left Leg Lower) REMOVAL EXTERNAL FIXATION LEG (Left Leg Lower)     Patient location during evaluation: PACU Anesthesia Type: General Level of consciousness: awake Pain management: pain level controlled Vital Signs Assessment: post-procedure vital signs reviewed and stable Respiratory status: spontaneous breathing Cardiovascular status: stable Postop Assessment: no apparent nausea or vomiting Anesthetic complications: no    Last Vitals:  Vitals:   11/09/18 0549 11/09/18 1453  BP: 128/85 128/79  Pulse: 70 70  Resp: 18 16  Temp: 36.9 C 36.8 C  SpO2: 100% 100%    Last Pain:  Vitals:   11/09/18 1453  TempSrc: Oral  PainSc:    Pain Goal: Patients Stated Pain Goal: 3 (11/08/18 2112)               Caren Macadam

## 2018-11-10 ENCOUNTER — Inpatient Hospital Stay (HOSPITAL_COMMUNITY): Payer: Self-pay

## 2018-11-10 LAB — CBC
HEMATOCRIT: 24.5 % — AB (ref 39.0–52.0)
Hemoglobin: 8.1 g/dL — ABNORMAL LOW (ref 13.0–17.0)
MCH: 29.7 pg (ref 26.0–34.0)
MCHC: 33.1 g/dL (ref 30.0–36.0)
MCV: 89.7 fL (ref 80.0–100.0)
Platelets: 73 10*3/uL — ABNORMAL LOW (ref 150–400)
RBC: 2.73 MIL/uL — ABNORMAL LOW (ref 4.22–5.81)
RDW: 13.8 % (ref 11.5–15.5)
WBC: 4.4 10*3/uL (ref 4.0–10.5)
nRBC: 0 % (ref 0.0–0.2)

## 2018-11-10 MED ORDER — OXYCODONE HCL 5 MG PO TABS: 5.0000 mg | ORAL_TABLET | ORAL | 0 refills | Status: DC | PRN

## 2018-11-10 MED ORDER — PANTOPRAZOLE SODIUM 20 MG PO TBEC
20.0000 mg | DELAYED_RELEASE_TABLET | Freq: Every day | ORAL | Status: DC
  Administered 2018-11-10: 20 mg via ORAL
  Filled 2018-11-10: qty 1

## 2018-11-10 NOTE — Discharge Summary (Signed)
Orthopaedic Trauma Service (OTS) Discharge Summary   Patient ID: Richard Lopez MRN: [id number] DOB/AGE: 1975-12-13 43 y.o.  Admit date: 11/07/2018 Discharge date: 11/10/2018  Admission Diagnoses: 1. Closed fracture of left distal tibia 2. Closed nondisplaced comminuted fracture of shaft of left tibia  Discharge Diagnoses:  Active Problems:   Closed fracture of left distal tibia   Closed nondisplaced comminuted fracture of shaft of left tibia   Past Medical History:  Diagnosis Date  . Anxiety   . 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 Depression   . Diabetes mellitus    diet controlled  . Essential hypertension   . Gastritis   . Gout      Procedures Performed: placement of spanning external fixator, removal of external fixator and ORIF left tibia fracture  Discharged Condition: good  Hospital Course: Patient presented to [facility] ED on 11/07/2018 after having a misstep off of the curb. He had left ankle pain and inability to weight-bear.  X-rays revealed a comminuted displaced distal tibia and fibula fracture. He was taken urgently for closed reduction and external fixation by Dr. Linna Caprice.  Due to the complexity of his injury Dr. Linna Caprice felt that it was outside the scope of practice and felt that he would be best treated definitively by an orthopedic traumatologist. Patient taken back to operative suite on 11/08/2018 for open reduction internal fixation of left tibia and fibula. He was placed in a short leg splint and made nonweightbearing on the left lower extremity. He was evaluated and treated by physical and occupational therapy on post operative day #1. He was able to ambuate in the hallway with the use of a walker. He is able to be discharged home in good/stable condition on post operative day #2.   Consults: None  Significant Diagnostic Studies: None   Treatments: surgery: ORIF left tibia fracture  Discharge Exam: General - Sitting up on edge of bed, no  acute distress. Pleasant and cooperative Cardiac - Heart regular rate and rhythm Lungs - Clear to auscultation anterior lung fields Left lower extremity - Splint clean, dry and intact. Compartments soft and compressible. Wiggles toes. Sensation intact. No tenderness about the knee or thigh.  Disposition: Patient will be discharged to mother's home following PT/OT therapy session   Allergies as of 11/10/2018   No Known Allergies     Medication List    TAKE these medications   famotidine 20 MG tablet Commonly known as:  PEPCID Take 1 tablet (20 mg total) by mouth 2 (two) times daily as needed for heartburn or indigestion.   lisinopril-hydrochlorothiazide 10-12.5 MG tablet Commonly known as:  ZESTORETIC Take 1 tablet by mouth daily.   Melatonin 5 MG Caps Take 5 mg by mouth at bedtime as needed (sleep).   oxyCODONE 5 MG immediate release tablet Commonly known as:  Oxy IR/ROXICODONE Take 1-2 tablets (5-10 mg total) by mouth every 4 (four) hours as needed for moderate pain (pain score 4-6).      Follow-up Information    Haddix, Gillie Manners, MD. Schedule an appointment as soon as possible for a visit in 2 week(s).   Specialty:  Orthopedic Surgery Contact information: [address] [place] [address] [phone]           Discharge Instructions and Plan: Patient is in short leg splint. He will be non weight-bearing on left lower extremity with the use of a walker. Splint should stay clean, dry, and in place until he  is seen for follow-up in the outpatient setting. He will follow up in 2 weeks for suture removal and x-rays of the left tibia/fibula.   Signed:  Shawn Route. Ladonna Snide ?([phone]? (phone) 11/10/2018, 8:15 AM  [facility] [address] [place] [address] [phone] (F)

## 2018-11-10 NOTE — Progress Notes (Signed)
Physical Therapy Treatment Patient Details Name: Richard Lopez MRN: [id number] DOB: 15-Mar-1976 Today's Date: 11/10/2018    History of Present Illness Pt is a 43 y/o male w/ Closed comminuted left distal tib-fib fracture after stepping off curb s/p ex fix 1/5 and ORIF 1/6. PMhx: Anxiety, Depression, Diabetes mellitus, HTN, Gastritis, and Gout    PT Comments    Patient seen for mobility progression and stair training prior to discharge home. Patient wearing tennis shoe to R LE for increased stability. Use of gait belt and RW to ambulate from patient room to ortho gym with Min guard by PT for general safety - no physical assist needed. Patient stating he planned to use B axillary crutches for mobility once at home, therefore stair training with crutches performed. PT providing verbal education and demonstration prior to mobility training with good verbal understanding. Crutches fitted to appropriate patient height. Patient able to mobilize in ortho gym with crutches at min guard for safety. Up one step with B axillary crutches with min guard. While transitioning from first to second step patient loosing balance, with PT attempting to control balance with gait belt in place, however unable with resultant controlled descent to floor. Patient denying hitting head or impact to L LE. Nursing immediately notified with PT, nurse and nurse tech assisting patient from floor to recliner. Patient taken back to room with nursing assessing vitals - all stable. Patient denying increase in L ankle pain. PT assisting patient from recliner to bed with RW and gait belt without issue. Nursing notifying MD. PT to continue to follow.    Follow Up Recommendations  No PT follow up     Equipment Recommendations  Rolling walker with 5" wheels    Recommendations for Other Services       Precautions / Restrictions Precautions Precautions: Fall Restrictions Weight Bearing Restrictions: Yes LLE Weight Bearing: Non  weight bearing    Mobility  Bed Mobility Overal bed mobility: Modified Independent                Transfers Overall transfer level: Needs assistance Equipment used: Rolling walker (2 wheeled) Transfers: Sit to/from Stand Sit to Stand: Min guard         General transfer comment: min guard for safety - no LOB  Ambulation/Gait Ambulation/Gait assistance: Min guard Gait Distance (Feet): 100 Feet Assistive device: Rolling walker (2 wheeled);Crutches Gait Pattern/deviations: Step-to pattern Gait velocity: decreased   General Gait Details: cueing for safety and sequencing, no LOB; use of crutches in gym with pateint appropriately fitted and edcuation on safe use of AD   Stairs Stairs: Yes Stairs assistance: Min guard Stair Management: Step to pattern;With crutches Number of Stairs: 1 General stair comments: patient educated bot verablly and with demonstration prior to training with good verbal understanding. LOB from firt to second step with resultant fall. PT attempting to contorl fall with gait belt   Wheelchair Mobility    Modified [place] (Stroke Patients Only)       Balance Overall balance assessment: Mild deficits observed, not formally tested                                          Cognition Arousal/Alertness: Awake/alert Behavior During Therapy: WFL for tasks assessed/performed Overall Cognitive Status: Within Functional Limits for tasks assessed  Exercises      General Comments        Pertinent Vitals/Pain Pain Assessment: Faces Faces Pain Scale: Hurts a little bit Pain Location: LLE at ankle and shin Pain Descriptors / Indicators: Discomfort;Guarding Pain Intervention(s): Limited activity within patient's tolerance;Monitored during session;Repositioned    Home Living                      Prior Function            PT Goals (current goals can now be found  in the care plan section) Acute Rehab PT Goals Patient Stated Goal: return home PT Goal Formulation: With patient Time For Goal Achievement: 11/16/18 Potential to Achieve Goals: Good Progress towards PT goals: Progressing toward goals    Frequency    Min 5X/week      PT Plan Current plan remains appropriate    Co-evaluation              AM-PAC PT "6 Clicks" Mobility   Outcome Measure  Help needed turning from your back to your side while in a flat bed without using bedrails?: None Help needed moving from lying on your back to sitting on the side of a flat bed without using bedrails?: None Help needed moving to and from a bed to a chair (including a wheelchair)?: A Little Help needed standing up from a chair using your arms (e.g., wheelchair or bedside chair)?: A Little Help needed to walk in hospital room?: A Little Help needed climbing 3-5 steps with a railing? : A Little 6 Click Score: 20    End of Session Equipment Utilized During Treatment: Gait belt Activity Tolerance: Patient tolerated treatment well Patient left: in bed;with call bell/phone within reach;with nursing/sitter in room Nurse Communication: Mobility status PT Visit Diagnosis: Other abnormalities of gait and mobility (R26.89)     Time: 2836-6294 PT Time Calculation (min) (ACUTE ONLY): 34 min  Charges:  $Gait Training: 23-37 mins                      Kipp Laurence, PT, DPT Supplemental Physical Therapist 11/10/18 12:50 PM Pager: [phone] Office: [phone]

## 2018-11-10 NOTE — Progress Notes (Signed)
Occupational Therapy Treatment Patient Details Name: Richard Lopez MRN: [id number] DOB: May 02, 1976 Today's Date: 11/10/2018    History of present illness Pt is a 43 y/o male w/ Closed comminuted left distal tib-fib fracture after stepping off curb s/p ex fix 1/5 and ORIF 1/6. PMhx: Anxiety, Depression, Diabetes mellitus, HTN, Gastritis, and Gout   OT comments  Pt progressing towards OT goals this session. Overall min guard for safety as he'd had a fall earlier today. Pt was able to perform tub transfer with tub bench at min guard, LB dressing with set up, and toilet transfer with min guard. Pt feels confident going home and has appropriate DME. OK for home from OT standpoint. Education complete.   Follow Up Recommendations  No OT follow up;Supervision/Assistance - 24 hour    Equipment Recommendations  None recommended by OT(Pt has appropriate DME)    Recommendations for Other Services      Precautions / Restrictions Precautions Precautions: Fall Restrictions Weight Bearing Restrictions: Yes LLE Weight Bearing: Non weight bearing       Mobility Bed Mobility Overal bed mobility: Modified Independent                Transfers Overall transfer level: Needs assistance Equipment used: Rolling walker (2 wheeled) Transfers: Sit to/from Stand Sit to Stand: Min guard         General transfer comment: min guard for safety from bedside and recliner - low surfaces    Balance Overall balance assessment: Mild deficits observed, not formally tested                                         ADL either performed or assessed with clinical judgement   ADL Overall ADL's : Needs assistance/impaired                         Toilet Transfer: Min guard;Ambulation;RW Toilet Transfer Details (indicate cue type and reason): vc for safe hand placement Toileting- Clothing Manipulation and Hygiene: Supervision/safety;Sitting/lateral lean   Tub/ Shower Transfer:  Tub transfer;Tub Insurance underwriter Details (indicate cue type and reason): practiced in gym Functional mobility during ADLs: Min guard;Rolling walker;Cueing for sequencing       Vision       Perception     Praxis      Cognition Arousal/Alertness: Awake/alert Behavior During Therapy: WFL for tasks assessed/performed Overall Cognitive Status: Within Functional Limits for tasks assessed                                          Exercises     Shoulder Instructions       General Comments      Pertinent Vitals/ Pain       Pain Assessment: No/denies pain Pain Intervention(s): Monitored during session  Home Living                                          Prior Functioning/Environment              Frequency  Min 2X/week        Progress Toward Goals  OT Goals(current goals can now be found in the  care plan section)  Progress towards OT goals: Progressing toward goals  Acute Rehab OT Goals Patient Stated Goal: return home OT Goal Formulation: With patient Time For Goal Achievement: 11/23/18 Potential to Achieve Goals: Good  Plan Discharge plan remains appropriate;Frequency remains appropriate    Co-evaluation                 AM-PAC OT "6 Clicks" Daily Activity     Outcome Measure   Help from another person eating meals?: None Help from another person taking care of personal grooming?: A Little Help from another person toileting, which includes using toliet, bedpan, or urinal?: A Little Help from another person bathing (including washing, rinsing, drying)?: A Little Help from another person to put on and taking off regular upper body clothing?: None Help from another person to put on and taking off regular lower body clothing?: A Little 6 Click Score: 20    End of Session Equipment Utilized During Treatment: Rolling walker;Gait belt  OT Visit Diagnosis: Unsteadiness on feet (R26.81);Other  abnormalities of gait and mobility (R26.89);Muscle weakness (generalized) (M62.81);Pain Pain - Right/Left: Left Pain - part of body: Leg;Ankle and joints of foot   Activity Tolerance Patient tolerated treatment well   Patient Left in bed;with call bell/phone within reach   Nurse Communication Mobility status;Precautions        Time: 0272-5366 OT Time Calculation (min): 11 min  Charges: OT General Charges $OT Visit: 1 Visit OT Treatments $Self Care/Home Management : 8-22 mins  Sherryl Manges OTR/L Acute Rehabilitation Services Pager: [phone] Office: [phone]   Richard Lopez 11/10/2018, 5:08 PM

## 2018-11-10 NOTE — Progress Notes (Addendum)
1130 Pt was ambulating with PT to the hall, then to the PT gym. While doing stairs with PT, pt lost his balance to the 2nd step. PT holding gait belt, guided pt to the floor. Did not hit head, no impact to LLE. Pt denies increase pain to LLE. V/S checked, stable. 1200 [facility] ortho trauma PA notified. Will order left lower leg xray. 1600 Tib-fib xray done this afternoon, result reviewed by Ulyses Southward PA, okeyed  for discharge today. 1610 Pt walked with PT/OT and tried stairs again.  Waiting for walker to be delivered in the room. 1735 Discharge instructions given to pt, verbalized understanding. Discharged to home picked up by family.

## 2018-11-10 NOTE — Progress Notes (Signed)
Physical Therapy Treatment Patient Details Name: Richard Lopez MRN: [id number] DOB: 12-02-1975 Today's Date: 11/10/2018    History of Present Illness Pt is a 43 y/o male w/ Closed comminuted left distal tib-fib fracture after stepping off curb s/p ex fix 1/5 and ORIF 1/6. PMhx: Anxiety, Depression, Diabetes mellitus, HTN, Gastritis, and Gout    PT Comments    Per nursing staff, patient cleared by MD to d/c home today. 2nd session for continued stair training as patient requires to navigate 2 steps to enter home. Patient ambulating with RW with min guard. Education on safety and sequencing of stairs with RW this session both verbally and by demonstration with handout also given for reference with patient reporting good understanding.  Patient navigating 2 steps with step to pattern with RW with min guard +2 for general safety - PT providing guarding at gait belt with +2 at front of walker to maintain stability. Will recommend return home with RW. Education on need for safety and precautions with good patient understanding.    Follow Up Recommendations  No PT follow up     Equipment Recommendations  Rolling walker with 5" wheels    Recommendations for Other Services       Precautions / Restrictions Precautions Precautions: Fall Restrictions Weight Bearing Restrictions: Yes LLE Weight Bearing: Non weight bearing    Mobility  Bed Mobility Overal bed mobility: Modified Independent                Transfers Overall transfer level: Needs assistance Equipment used: Rolling walker (2 wheeled) Transfers: Sit to/from Stand Sit to Stand: Min guard         General transfer comment: min guard for safety from bedside and recliner - low surfaces  Ambulation/Gait Ambulation/Gait assistance: Min guard Gait Distance (Feet): 80 Feet Assistive device: Rolling walker (2 wheeled) Gait Pattern/deviations: Step-to pattern     General Gait Details: ambulation wtih RW into hallway -  patient fatiguing requiring seated rest break   Stairs Stairs: Yes Stairs assistance: Min guard;+2 safety/equipment Stair Management: Step to pattern;With walker Number of Stairs: 2 General stair comments: education on safety and sequencing both verbally and with demonstration - good understanding; handout also given to patient for reference; step to pattern    Wheelchair Mobility    Modified [place] (Stroke Patients Only)       Balance Overall balance assessment: Mild deficits observed, not formally tested                                          Cognition Arousal/Alertness: Awake/alert Behavior During Therapy: WFL for tasks assessed/performed Overall Cognitive Status: Within Functional Limits for tasks assessed                                        Exercises      General Comments        Pertinent Vitals/Pain Pain Assessment: No/denies pain    Home Living                      Prior Function            PT Goals (current goals can now be found in the care plan section) Acute Rehab PT Goals Patient Stated Goal: return home PT Goal Formulation: With  patient Time For Goal Achievement: 11/16/18 Potential to Achieve Goals: Good Progress towards PT goals: Progressing toward goals    Frequency    Min 5X/week      PT Plan Current plan remains appropriate    Co-evaluation              AM-PAC PT "6 Clicks" Mobility   Outcome Measure  Help needed turning from your back to your side while in a flat bed without using bedrails?: None Help needed moving from lying on your back to sitting on the side of a flat bed without using bedrails?: None Help needed moving to and from a bed to a chair (including a wheelchair)?: A Little Help needed standing up from a chair using your arms (e.g., wheelchair or bedside chair)?: A Little Help needed to walk in hospital room?: A Little Help needed climbing 3-5 steps with a  railing? : A Little 6 Click Score: 20    End of Session Equipment Utilized During Treatment: Gait belt Activity Tolerance: Patient tolerated treatment well Patient left: in chair;with call bell/phone within reach;with nursing/sitter in room Nurse Communication: Mobility status PT Visit Diagnosis: Other abnormalities of gait and mobility (R26.89)     Time: 9373-4287 PT Time Calculation (min) (ACUTE ONLY): 9 min  Charges:  $Gait Training: 8-22 mins                     Kipp Laurence, PT, DPT Supplemental Physical Therapist 11/10/18 4:26 PM Pager: [phone] Office: [phone]

## 2018-11-10 NOTE — Discharge Instructions (Signed)
Orthopaedic Trauma Service Discharge Instructions   General Discharge Instructions  WEIGHT BEARING STATUS: Non weight-bearing left lower extremity  RANGE OF MOTION/ACTIVITY: Okay for full acitve range of motion of left knee and hip.  Wound Care: Keep splint in place until follow up appoint. Keep splint clean and dry.  DVT/PE prophylaxis: None needed at discharge  Diet: as you were eating previously.  Can use over the counter stool softeners and bowel preparations, such as Miralax, to help with bowel movements.  Narcotics can be constipating.  Be sure to drink plenty of fluids  PAIN MEDICATION USE AND EXPECTATIONS  You have likely been given narcotic medications to help control your pain.  After a traumatic event that results in an fracture (broken bone) with or without surgery, it is ok to use narcotic pain medications to help control one's pain.  We understand that everyone responds to pain differently and each individual patient will be evaluated on a regular basis for the continued need for narcotic medications. Ideally, narcotic medication use should last no more than 6-8 weeks (coinciding with fracture healing).   As a patient it is your responsibility as well to monitor narcotic medication use and report the amount and frequency you use these medications when you come to your office visit.   We would also advise that if you are using narcotic medications, you should take a dose prior to therapy to maximize you participation.  IF YOU ARE ON NARCOTIC MEDICATIONS IT IS NOT PERMISSIBLE TO OPERATE A MOTOR VEHICLE (MOTORCYCLE/CAR/TRUCK/MOPED) OR HEAVY MACHINERY DO NOT MIX NARCOTICS WITH OTHER CNS (CENTRAL NERVOUS SYSTEM) DEPRESSANTS SUCH AS ALCOHOL   STOP SMOKING OR USING NICOTINE PRODUCTS!!!!  As discussed nicotine severely impairs your body's ability to heal surgical and traumatic wounds but also impairs bone healing.  Wounds and bone heal by forming microscopic blood vessels  (angiogenesis) and nicotine is a vasoconstrictor (essentially, shrinks blood vessels).  Therefore, if vasoconstriction occurs to these microscopic blood vessels they essentially disappear and are unable to deliver necessary nutrients to the healing tissue.  This is one modifiable factor that you can do to dramatically increase your chances of healing your injury.    (This means no smoking, no nicotine gum, patches, etc)  DO NOT USE NONSTEROIDAL ANTI-INFLAMMATORY DRUGS (NSAID'S)  Using products such as Advil (ibuprofen), Aleve (naproxen), Motrin (ibuprofen) for additional pain control during fracture healing can delay and/or prevent the healing response.  If you would like to take over the counter (OTC) medication, Tylenol (acetaminophen) is ok.  However, some narcotic medications that are given for pain control contain acetaminophen as well. Therefore, you should not exceed more than 4000 mg of tylenol in a day if you do not have liver disease.  Also note that there are may OTC medicines, such as cold medicines and allergy medicines that my contain tylenol as well.  If you have any questions about medications and/or interactions please ask your doctor/PA or your pharmacist.      ICE AND ELEVATE INJURED/OPERATIVE EXTREMITY  Using ice and elevating the injured extremity above your heart can help with swelling and pain control.  Icing in a pulsatile fashion, such as 20 minutes on and 20 minutes off, can be followed.    Do not place ice directly on skin. Make sure there is a barrier between to skin and the ice pack.    Using frozen items such as frozen peas works well as the conform nicely to the are that needs to be  iced.  USE AN ACE WRAP OR TED HOSE FOR SWELLING CONTROL  In addition to icing and elevation, Ace wraps or TED hose are used to help limit and resolve swelling.  It is recommended to use Ace wraps or TED hose until you are informed to stop.    When using Ace Wraps start the wrapping distally  (farthest away from the body) and wrap proximally (closer to the body)   Example: If you had surgery on your leg or thing and you do not have a splint on, start the ace wrap at the toes and work your way up to the thigh        If you had surgery on your upper extremity and do not have a splint on, start the ace wrap at your fingers and work your way up to the upper arm  IF YOU ARE IN A SPLINT OR CAST DO NOT REMOVE IT FOR ANY REASON   If your splint gets wet for any reason please contact the office immediately. You may shower in your splint or cast as long as you keep it dry.  This can be done by wrapping in a cast cover or garbage back (or similar)  Do Not stick any thing down your splint or cast such as pencils, money, or hangers to try and scratch yourself with.  If you feel itchy take benadryl as prescribed on the bottle for itching    CALL THE OFFICE WITH ANY QUESTIONS OR CONCERNS: [phone]

## 2019-03-25 ENCOUNTER — Inpatient Hospital Stay (HOSPITAL_COMMUNITY)
Admission: EM | Admit: 2019-03-25 | Discharge: 2019-04-01 | DRG: 481 | Disposition: A | Discharge status: Home Health | Payer: Self-pay | Attending: Internal Medicine | Admitting: Internal Medicine

## 2019-03-25 ENCOUNTER — Emergency Department (HOSPITAL_COMMUNITY): Payer: Self-pay

## 2019-03-25 ENCOUNTER — Encounter (HOSPITAL_COMMUNITY): Payer: Self-pay | Admitting: Emergency Medicine

## 2019-03-25 ENCOUNTER — Other Ambulatory Visit: Payer: Self-pay

## 2019-03-25 DIAGNOSIS — T464X6A Underdosing of angiotensin-converting-enzyme inhibitors, initial encounter: Secondary | ICD-10-CM | POA: Diagnosis present

## 2019-03-25 DIAGNOSIS — D6959 Other secondary thrombocytopenia: Secondary | ICD-10-CM | POA: Diagnosis present

## 2019-03-25 DIAGNOSIS — R52 Pain, unspecified: Secondary | ICD-10-CM

## 2019-03-25 DIAGNOSIS — Z833 Family history of diabetes mellitus: Secondary | ICD-10-CM

## 2019-03-25 DIAGNOSIS — M109 Gout, unspecified: Secondary | ICD-10-CM | POA: Diagnosis present

## 2019-03-25 DIAGNOSIS — Z1159 Encounter for screening for other viral diseases: Secondary | ICD-10-CM

## 2019-03-25 DIAGNOSIS — E6609 Other obesity due to excess calories: Secondary | ICD-10-CM | POA: Diagnosis present

## 2019-03-25 DIAGNOSIS — S72001A Fracture of unspecified part of neck of right femur, initial encounter for closed fracture: Secondary | ICD-10-CM

## 2019-03-25 DIAGNOSIS — Y92230 Patient room in hospital as the place of occurrence of the external cause: Secondary | ICD-10-CM | POA: Diagnosis present

## 2019-03-25 DIAGNOSIS — E119 Type 2 diabetes mellitus without complications: Secondary | ICD-10-CM | POA: Diagnosis present

## 2019-03-25 DIAGNOSIS — M25462 Effusion, left knee: Secondary | ICD-10-CM | POA: Diagnosis present

## 2019-03-25 DIAGNOSIS — I160 Hypertensive urgency: Secondary | ICD-10-CM | POA: Diagnosis present

## 2019-03-25 DIAGNOSIS — F1010 Alcohol abuse, uncomplicated: Secondary | ICD-10-CM

## 2019-03-25 DIAGNOSIS — Y9389 Activity, other specified: Secondary | ICD-10-CM

## 2019-03-25 DIAGNOSIS — F329 Major depressive disorder, single episode, unspecified: Secondary | ICD-10-CM | POA: Diagnosis present

## 2019-03-25 DIAGNOSIS — E876 Hypokalemia: Secondary | ICD-10-CM | POA: Diagnosis not present

## 2019-03-25 DIAGNOSIS — Y906 Blood alcohol level of 120-199 mg/100 ml: Secondary | ICD-10-CM | POA: Diagnosis present

## 2019-03-25 DIAGNOSIS — R680 Hypothermia, not associated with low environmental temperature: Secondary | ICD-10-CM | POA: Diagnosis not present

## 2019-03-25 DIAGNOSIS — Z09 Encounter for follow-up examination after completed treatment for conditions other than malignant neoplasm: Secondary | ICD-10-CM

## 2019-03-25 DIAGNOSIS — Z79899 Other long term (current) drug therapy: Secondary | ICD-10-CM

## 2019-03-25 DIAGNOSIS — R112 Nausea with vomiting, unspecified: Secondary | ICD-10-CM

## 2019-03-25 DIAGNOSIS — I10 Essential (primary) hypertension: Secondary | ICD-10-CM | POA: Diagnosis present

## 2019-03-25 DIAGNOSIS — D696 Thrombocytopenia, unspecified: Secondary | ICD-10-CM | POA: Diagnosis present

## 2019-03-25 DIAGNOSIS — T426X5A Adverse effect of other antiepileptic and sedative-hypnotic drugs, initial encounter: Secondary | ICD-10-CM | POA: Diagnosis not present

## 2019-03-25 DIAGNOSIS — S72141A Displaced intertrochanteric fracture of right femur, initial encounter for closed fracture: Principal | ICD-10-CM | POA: Diagnosis present

## 2019-03-25 DIAGNOSIS — G9340 Encephalopathy, unspecified: Secondary | ICD-10-CM

## 2019-03-25 DIAGNOSIS — E559 Vitamin D deficiency, unspecified: Secondary | ICD-10-CM | POA: Diagnosis present

## 2019-03-25 DIAGNOSIS — Z91128 Patient's intentional underdosing of medication regimen for other reason: Secondary | ICD-10-CM

## 2019-03-25 DIAGNOSIS — F10229 Alcohol dependence with intoxication, unspecified: Secondary | ICD-10-CM

## 2019-03-25 DIAGNOSIS — Y9289 Other specified places as the place of occurrence of the external cause: Secondary | ICD-10-CM

## 2019-03-25 DIAGNOSIS — R001 Bradycardia, unspecified: Secondary | ICD-10-CM | POA: Diagnosis not present

## 2019-03-25 DIAGNOSIS — T502X6A Underdosing of carbonic-anhydrase inhibitors, benzothiadiazides and other diuretics, initial encounter: Secondary | ICD-10-CM | POA: Diagnosis present

## 2019-03-25 DIAGNOSIS — S7291XA Unspecified fracture of right femur, initial encounter for closed fracture: Secondary | ICD-10-CM

## 2019-03-25 DIAGNOSIS — G312 Degeneration of nervous system due to alcohol: Secondary | ICD-10-CM | POA: Diagnosis present

## 2019-03-25 DIAGNOSIS — W03XXXA Other fall on same level due to collision with another person, initial encounter: Secondary | ICD-10-CM | POA: Diagnosis present

## 2019-03-25 DIAGNOSIS — S72144D Nondisplaced intertrochanteric fracture of right femur, subsequent encounter for closed fracture with routine healing: Secondary | ICD-10-CM

## 2019-03-25 DIAGNOSIS — E871 Hypo-osmolality and hyponatremia: Secondary | ICD-10-CM | POA: Diagnosis not present

## 2019-03-25 DIAGNOSIS — F17210 Nicotine dependence, cigarettes, uncomplicated: Secondary | ICD-10-CM | POA: Diagnosis present

## 2019-03-25 DIAGNOSIS — T148XXA Other injury of unspecified body region, initial encounter: Secondary | ICD-10-CM

## 2019-03-25 DIAGNOSIS — D62 Acute posthemorrhagic anemia: Secondary | ICD-10-CM | POA: Diagnosis not present

## 2019-03-25 DIAGNOSIS — R7303 Prediabetes: Secondary | ICD-10-CM

## 2019-03-25 DIAGNOSIS — Z6825 Body mass index (BMI) 25.0-25.9, adult: Secondary | ICD-10-CM

## 2019-03-25 DIAGNOSIS — Z8249 Family history of ischemic heart disease and other diseases of the circulatory system: Secondary | ICD-10-CM

## 2019-03-25 DIAGNOSIS — F419 Anxiety disorder, unspecified: Secondary | ICD-10-CM | POA: Diagnosis present

## 2019-03-25 DIAGNOSIS — M25461 Effusion, right knee: Secondary | ICD-10-CM | POA: Diagnosis present

## 2019-03-25 DIAGNOSIS — F10231 Alcohol dependence with withdrawal delirium: Secondary | ICD-10-CM | POA: Diagnosis present

## 2019-03-25 HISTORY — DX: Alcohol dependence with intoxication, unspecified: F10.229

## 2019-03-25 HISTORY — DX: Fracture of unspecified part of neck of right femur, initial encounter for closed fracture: S72.001A

## 2019-03-25 LAB — BASIC METABOLIC PANEL
Anion gap: 20 — ABNORMAL HIGH (ref 5–15)
BUN: <5 mg/dL — ABNORMAL LOW (ref 6–20)
CO2: 18 mmol/L — ABNORMAL LOW (ref 22–32)
Calcium: 8.6 mg/dL — ABNORMAL LOW (ref 8.9–10.3)
Chloride: 101 mmol/L (ref 98–111)
Creatinine, Ser: 0.91 mg/dL (ref 0.61–1.24)
GFR calc Af Amer: >60 mL/min (ref >60)
GFR calc non Af Amer: >60 mL/min (ref >60)
Glucose, Bld: 115 mg/dL — ABNORMAL HIGH (ref 70–99)
Potassium: 3.6 mmol/L (ref 3.5–5.1)
Sodium: 139 mmol/L (ref 135–145)

## 2019-03-25 LAB — SARS CORONAVIRUS 2 BY RT PCR (HOSPITAL ORDER, PERFORMED IN ~~LOC~~ HOSPITAL LAB): SARS Coronavirus 2: NEGATIVE

## 2019-03-25 LAB — HEPATIC FUNCTION PANEL
ALT: 55 U/L — ABNORMAL HIGH (ref 0–44)
AST: 114 U/L — ABNORMAL HIGH (ref 15–41)
Albumin: 3.9 g/dL (ref 3.5–5.0)
Alkaline Phosphatase: 97 U/L (ref 38–126)
Bilirubin, Direct: 0.2 mg/dL (ref 0.0–0.2)
Indirect Bilirubin: 0.6 mg/dL (ref 0.3–0.9)
Total Bilirubin: 0.8 mg/dL (ref 0.3–1.2)
Total Protein: 7.7 g/dL (ref 6.5–8.1)

## 2019-03-25 LAB — CBC WITH DIFFERENTIAL/PLATELET
Abs Immature Granulocytes: 0.01 10*3/uL (ref 0.00–0.07)
Basophils Absolute: 0.1 10*3/uL (ref 0.0–0.1)
Basophils Relative: 2 %
Eosinophils Absolute: 0 10*3/uL (ref 0.0–0.5)
Eosinophils Relative: 1 %
HCT: 34.8 % — ABNORMAL LOW (ref 39.0–52.0)
Hemoglobin: 11.9 g/dL — ABNORMAL LOW (ref 13.0–17.0)
Immature Granulocytes: 0 %
Lymphocytes Relative: 34 %
Lymphs Abs: 1.5 10*3/uL (ref 0.7–4.0)
MCH: 30.7 pg (ref 26.0–34.0)
MCHC: 34.2 g/dL (ref 30.0–36.0)
MCV: 89.7 fL (ref 80.0–100.0)
Monocytes Absolute: 0.4 10*3/uL (ref 0.1–1.0)
Monocytes Relative: 9 %
Neutro Abs: 2.5 10*3/uL (ref 1.7–7.7)
Neutrophils Relative %: 54 %
Platelets: 102 10*3/uL — ABNORMAL LOW (ref 150–400)
RBC: 3.88 MIL/uL — ABNORMAL LOW (ref 4.22–5.81)
RDW: 16 % — ABNORMAL HIGH (ref 11.5–15.5)
WBC: 4.6 10*3/uL (ref 4.0–10.5)
nRBC: 0 % (ref 0.0–0.2)

## 2019-03-25 LAB — ABO/RH: ABO/RH(D): O POS

## 2019-03-25 LAB — CBG MONITORING, ED: Glucose-Capillary: 106 mg/dL — ABNORMAL HIGH (ref 70–99)

## 2019-03-25 LAB — ETHANOL: Alcohol, Ethyl (B): 194 mg/dL — ABNORMAL HIGH (ref <10)

## 2019-03-25 LAB — PROTIME-INR
INR: 1.1 (ref 0.8–1.2)
Prothrombin Time: 14.2 seconds (ref 11.4–15.2)

## 2019-03-25 MED ORDER — ONDANSETRON HCL 4 MG/2ML IJ SOLN
4.0000 mg | Freq: Once | INTRAMUSCULAR | Status: AC
  Administered 2019-03-25: 4 mg via INTRAVENOUS
  Filled 2019-03-25: qty 2

## 2019-03-25 MED ORDER — ADULT MULTIVITAMIN W/MINERALS CH
1.0000 | ORAL_TABLET | Freq: Every day | ORAL | Status: DC
  Administered 2019-03-25 – 2019-04-01 (×7): 1 via ORAL
  Filled 2019-03-25 (×7): qty 1

## 2019-03-25 MED ORDER — METHOCARBAMOL 1000 MG/10ML IJ SOLN
500.0000 mg | Freq: Four times a day (QID) | INTRAVENOUS | Status: DC | PRN
  Filled 2019-03-25: qty 5

## 2019-03-25 MED ORDER — SODIUM CHLORIDE 0.45 % IV SOLN
INTRAVENOUS | Status: DC
  Administered 2019-03-25: 23:00:00 via INTRAVENOUS
  Filled 2019-03-25: qty 1000

## 2019-03-25 MED ORDER — LORAZEPAM 2 MG/ML IJ SOLN
0.0000 mg | Freq: Four times a day (QID) | INTRAMUSCULAR | Status: AC
  Administered 2019-03-26 (×2): 1 mg via INTRAVENOUS
  Administered 2019-03-27 (×2): 4 mg via INTRAVENOUS
  Filled 2019-03-25: qty 1
  Filled 2019-03-25: qty 2
  Filled 2019-03-25: qty 1
  Filled 2019-03-25: qty 2

## 2019-03-25 MED ORDER — FAMOTIDINE 20 MG PO TABS
20.0000 mg | ORAL_TABLET | Freq: Every day | ORAL | Status: DC
  Administered 2019-03-27 – 2019-04-01 (×6): 20 mg via ORAL
  Filled 2019-03-25 (×6): qty 1

## 2019-03-25 MED ORDER — THIAMINE HCL 100 MG/ML IJ SOLN: 100.0000 mg | Freq: Every day | INTRAMUSCULAR | Status: DC

## 2019-03-25 MED ORDER — MELATONIN 3 MG PO TABS
3.0000 mg | ORAL_TABLET | Freq: Every evening | ORAL | Status: DC | PRN
  Filled 2019-03-25: qty 1

## 2019-03-25 MED ORDER — HYDROMORPHONE HCL 1 MG/ML IJ SOLN
1.0000 mg | Freq: Once | INTRAMUSCULAR | Status: AC
  Administered 2019-03-25: 1 mg via INTRAVENOUS
  Filled 2019-03-25: qty 1

## 2019-03-25 MED ORDER — VITAMIN B-1 100 MG PO TABS
100.0000 mg | ORAL_TABLET | Freq: Every day | ORAL | Status: DC
  Administered 2019-03-25 – 2019-04-01 (×7): 100 mg via ORAL
  Filled 2019-03-25 (×8): qty 1

## 2019-03-25 MED ORDER — LORAZEPAM 2 MG/ML IJ SOLN
1.0000 mg | Freq: Once | INTRAMUSCULAR | Status: AC
  Administered 2019-03-25: 1 mg via INTRAVENOUS
  Filled 2019-03-25: qty 1

## 2019-03-25 MED ORDER — MORPHINE SULFATE (PF) 2 MG/ML IV SOLN
1.0000 mg | INTRAVENOUS | Status: DC | PRN
  Administered 2019-03-26: 2 mg via INTRAVENOUS
  Administered 2019-03-26: 1 mg via INTRAVENOUS
  Filled 2019-03-25 (×2): qty 1

## 2019-03-25 MED ORDER — HYDRALAZINE HCL 20 MG/ML IJ SOLN
5.0000 mg | INTRAMUSCULAR | Status: DC | PRN
  Administered 2019-03-26: 5 mg via INTRAVENOUS
  Filled 2019-03-25: qty 1

## 2019-03-25 MED ORDER — LORAZEPAM 2 MG/ML IJ SOLN
0.0000 mg | Freq: Two times a day (BID) | INTRAMUSCULAR | Status: AC
  Administered 2019-03-28 (×2): 2 mg via INTRAVENOUS
  Filled 2019-03-25 (×2): qty 1

## 2019-03-25 MED ORDER — SENNOSIDES-DOCUSATE SODIUM 8.6-50 MG PO TABS: 1.0000 | ORAL_TABLET | Freq: Every evening | ORAL | Status: DC | PRN

## 2019-03-25 MED ORDER — MORPHINE SULFATE (PF) 4 MG/ML IV SOLN
4.0000 mg | Freq: Once | INTRAVENOUS | Status: AC
  Administered 2019-03-25: 4 mg via INTRAVENOUS
  Filled 2019-03-25: qty 1

## 2019-03-25 MED ORDER — LORAZEPAM 2 MG/ML IJ SOLN
1.0000 mg | Freq: Four times a day (QID) | INTRAMUSCULAR | Status: AC | PRN
  Administered 2019-03-26 – 2019-03-28 (×2): 1 mg via INTRAVENOUS
  Filled 2019-03-25 (×2): qty 1

## 2019-03-25 MED ORDER — FOLIC ACID 1 MG PO TABS
1.0000 mg | ORAL_TABLET | Freq: Every day | ORAL | Status: DC
  Administered 2019-03-25 – 2019-04-01 (×7): 1 mg via ORAL
  Filled 2019-03-25 (×7): qty 1

## 2019-03-25 MED ORDER — PROMETHAZINE HCL 25 MG/ML IJ SOLN
25.0000 mg | Freq: Once | INTRAMUSCULAR | Status: AC
  Administered 2019-03-25: 25 mg via INTRAVENOUS
  Filled 2019-03-25: qty 1

## 2019-03-25 MED ORDER — METHOCARBAMOL 500 MG PO TABS
500.0000 mg | ORAL_TABLET | Freq: Four times a day (QID) | ORAL | Status: DC | PRN
  Administered 2019-03-26 – 2019-03-27 (×5): 500 mg via ORAL
  Filled 2019-03-25 (×6): qty 1

## 2019-03-25 MED ORDER — LORAZEPAM 1 MG PO TABS: 1.0000 mg | ORAL_TABLET | Freq: Four times a day (QID) | ORAL | Status: AC | PRN

## 2019-03-25 NOTE — ED Notes (Signed)
Patient transported to CT 
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
## 2019-03-25 NOTE — ED Notes (Signed)
All belongings given to wife

## 2019-03-25 NOTE — Progress Notes (Signed)
Orthopedic Tech Progress Note Patient Details:  Richard Lopez [birth year] [id number]      Post Interventions Patient Tolerated: Well Instructions Provided: Care of device, Adjustment of device   Norva Karvonen T 03/25/2019, 10:28 PM
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
## 2019-03-25 NOTE — ED Notes (Signed)
Mom- hazel [phone]

## 2019-03-26 ENCOUNTER — Inpatient Hospital Stay (HOSPITAL_COMMUNITY): Payer: Self-pay | Admitting: Anesthesiology

## 2019-03-26 ENCOUNTER — Inpatient Hospital Stay (HOSPITAL_COMMUNITY): Payer: Self-pay

## 2019-03-26 ENCOUNTER — Encounter (HOSPITAL_COMMUNITY): Payer: Self-pay | Admitting: Certified Registered"

## 2019-03-26 ENCOUNTER — Encounter (HOSPITAL_COMMUNITY)
Admission: EM | Disposition: A | Discharge status: Home Health | Payer: Self-pay | Source: Home / Self Care | Attending: Internal Medicine

## 2019-03-26 HISTORY — PX: INTRAMEDULLARY (IM) NAIL INTERTROCHANTERIC: SHX5875

## 2019-03-26 LAB — PHOSPHORUS: Phosphorus: 3.7 mg/dL (ref 2.5–4.6)

## 2019-03-26 LAB — FERRITIN: Ferritin: 417 ng/mL — ABNORMAL HIGH (ref 24–336)

## 2019-03-26 LAB — COMPREHENSIVE METABOLIC PANEL
ALT: 51 U/L — ABNORMAL HIGH (ref 0–44)
AST: 99 U/L — ABNORMAL HIGH (ref 15–41)
Albumin: 3.6 g/dL (ref 3.5–5.0)
Alkaline Phosphatase: 99 U/L (ref 38–126)
Anion gap: 13 (ref 5–15)
BUN: <5 mg/dL — ABNORMAL LOW (ref 6–20)
CO2: 22 mmol/L (ref 22–32)
Calcium: 8.5 mg/dL — ABNORMAL LOW (ref 8.9–10.3)
Chloride: 101 mmol/L (ref 98–111)
Creatinine, Ser: 0.87 mg/dL (ref 0.61–1.24)
GFR calc Af Amer: >60 mL/min (ref >60)
GFR calc non Af Amer: >60 mL/min (ref >60)
Glucose, Bld: 192 mg/dL — ABNORMAL HIGH (ref 70–99)
Potassium: 3.9 mmol/L (ref 3.5–5.1)
Sodium: 136 mmol/L (ref 135–145)
Total Bilirubin: 1.2 mg/dL (ref 0.3–1.2)
Total Protein: 6.8 g/dL (ref 6.5–8.1)

## 2019-03-26 LAB — CBC
HCT: 27.9 % — ABNORMAL LOW (ref 39.0–52.0)
Hemoglobin: 9.9 g/dL — ABNORMAL LOW (ref 13.0–17.0)
MCH: 30.7 pg (ref 26.0–34.0)
MCHC: 35.5 g/dL (ref 30.0–36.0)
MCV: 86.4 fL (ref 80.0–100.0)
Platelets: 95 10*3/uL — ABNORMAL LOW (ref 150–400)
RBC: 3.23 MIL/uL — ABNORMAL LOW (ref 4.22–5.81)
RDW: 15 % (ref 11.5–15.5)
WBC: 6.9 10*3/uL (ref 4.0–10.5)
nRBC: 0 % (ref 0.0–0.2)

## 2019-03-26 LAB — RETICULOCYTES
Immature Retic Fract: 10.8 % (ref 2.3–15.9)
RBC.: 3.23 MIL/uL — ABNORMAL LOW (ref 4.22–5.81)
Retic Count, Absolute: 64.3 10*3/uL (ref 19.0–186.0)
Retic Ct Pct: 2 % (ref 0.4–3.1)

## 2019-03-26 LAB — TYPE AND SCREEN
ABO/RH(D): O POS
Antibody Screen: NEGATIVE

## 2019-03-26 LAB — TSH: TSH: 0.7 u[IU]/mL (ref 0.350–4.500)

## 2019-03-26 LAB — IRON AND TIBC
Iron: 31 ug/dL — ABNORMAL LOW (ref 45–182)
Saturation Ratios: 15 % — ABNORMAL LOW (ref 17.9–39.5)
TIBC: 213 ug/dL — ABNORMAL LOW (ref 250–450)
UIBC: 182 ug/dL

## 2019-03-26 LAB — FOLATE: Folate: 13.4 ng/mL (ref >5.9)

## 2019-03-26 LAB — HEMOGLOBIN A1C
Hgb A1c MFr Bld: 5.8 % — ABNORMAL HIGH (ref 4.8–5.6)
Mean Plasma Glucose: 119.76 mg/dL

## 2019-03-26 LAB — PREALBUMIN: Prealbumin: 25 mg/dL (ref 18–38)

## 2019-03-26 LAB — VITAMIN B12: Vitamin B-12: 434 pg/mL (ref 180–914)

## 2019-03-26 LAB — HIV ANTIBODY (ROUTINE TESTING W REFLEX): HIV Screen 4th Generation wRfx: NONREACTIVE

## 2019-03-26 LAB — MRSA PCR SCREENING: MRSA by PCR: NEGATIVE

## 2019-03-26 LAB — MAGNESIUM: Magnesium: 1.2 mg/dL — ABNORMAL LOW (ref 1.7–2.4)

## 2019-03-26 LAB — GLUCOSE, CAPILLARY: Glucose-Capillary: 213 mg/dL — ABNORMAL HIGH (ref 70–99)

## 2019-03-26 SURGERY — FIXATION, FRACTURE, INTERTROCHANTERIC, WITH INTRAMEDULLARY ROD
Anesthesia: General | Laterality: Right

## 2019-03-26 MED ORDER — METHOCARBAMOL 500 MG PO TABS
ORAL_TABLET | ORAL | Status: AC
  Filled 2019-03-26: qty 1

## 2019-03-26 MED ORDER — PHENYLEPHRINE 40 MCG/ML (10ML) SYRINGE FOR IV PUSH (FOR BLOOD PRESSURE SUPPORT)
PREFILLED_SYRINGE | INTRAVENOUS | Status: AC
  Filled 2019-03-26: qty 10

## 2019-03-26 MED ORDER — MORPHINE SULFATE (PF) 2 MG/ML IV SOLN
2.0000 mg | INTRAVENOUS | Status: DC | PRN
  Administered 2019-03-27 – 2019-04-01 (×6): 2 mg via INTRAVENOUS
  Filled 2019-03-26 (×7): qty 1

## 2019-03-26 MED ORDER — ENOXAPARIN SODIUM 40 MG/0.4ML ~~LOC~~ SOLN
40.0000 mg | SUBCUTANEOUS | Status: DC
  Administered 2019-03-27 – 2019-04-01 (×6): 40 mg via SUBCUTANEOUS
  Filled 2019-03-26 (×7): qty 0.4

## 2019-03-26 MED ORDER — SUCCINYLCHOLINE CHLORIDE 200 MG/10ML IV SOSY
PREFILLED_SYRINGE | INTRAVENOUS | Status: DC | PRN
  Administered 2019-03-26: 120 mg via INTRAVENOUS

## 2019-03-26 MED ORDER — PHENYLEPHRINE 40 MCG/ML (10ML) SYRINGE FOR IV PUSH (FOR BLOOD PRESSURE SUPPORT)
PREFILLED_SYRINGE | INTRAVENOUS | Status: DC | PRN
  Administered 2019-03-26: 120 ug via INTRAVENOUS

## 2019-03-26 MED ORDER — ALBUTEROL SULFATE HFA 108 (90 BASE) MCG/ACT IN AERS
INHALATION_SPRAY | RESPIRATORY_TRACT | Status: DC | PRN
  Administered 2019-03-26: 6 via RESPIRATORY_TRACT

## 2019-03-26 MED ORDER — ACETAMINOPHEN 500 MG PO TABS
ORAL_TABLET | ORAL | Status: AC
  Administered 2019-03-26: 1000 mg via ORAL
  Filled 2019-03-26: qty 2

## 2019-03-26 MED ORDER — 0.9 % SODIUM CHLORIDE (POUR BTL) OPTIME
TOPICAL | Status: DC | PRN
  Administered 2019-03-26: 1000 mL

## 2019-03-26 MED ORDER — LISINOPRIL 40 MG PO TABS
40.0000 mg | ORAL_TABLET | Freq: Every day | ORAL | Status: DC
  Administered 2019-03-26 – 2019-04-01 (×6): 40 mg via ORAL
  Filled 2019-03-26 (×7): qty 1

## 2019-03-26 MED ORDER — OXYCODONE HCL 5 MG PO TABS
ORAL_TABLET | ORAL | Status: AC
  Filled 2019-03-26: qty 2

## 2019-03-26 MED ORDER — MIDAZOLAM HCL 2 MG/2ML IJ SOLN
2.0000 mg | Freq: Once | INTRAMUSCULAR | Status: AC
  Administered 2019-03-26: 10:00:00 2 mg via INTRAVENOUS

## 2019-03-26 MED ORDER — VANCOMYCIN HCL 1000 MG IV SOLR
INTRAVENOUS | Status: DC | PRN
  Administered 2019-03-26: 1000 mg via TOPICAL

## 2019-03-26 MED ORDER — LACTATED RINGERS IV SOLN
INTRAVENOUS | Status: DC
  Administered 2019-03-26: 10:00:00 via INTRAVENOUS

## 2019-03-26 MED ORDER — ONDANSETRON HCL 4 MG/2ML IJ SOLN
INTRAMUSCULAR | Status: AC
  Filled 2019-03-26: qty 2

## 2019-03-26 MED ORDER — DEXAMETHASONE SODIUM PHOSPHATE 10 MG/ML IJ SOLN
INTRAMUSCULAR | Status: AC
  Filled 2019-03-26: qty 1

## 2019-03-26 MED ORDER — MIDAZOLAM HCL 2 MG/2ML IJ SOLN
INTRAMUSCULAR | Status: AC
  Filled 2019-03-26: qty 2

## 2019-03-26 MED ORDER — LABETALOL HCL 5 MG/ML IV SOLN
5.0000 mg | Freq: Once | INTRAVENOUS | Status: AC
  Administered 2019-03-26: 5 mg via INTRAVENOUS

## 2019-03-26 MED ORDER — FENTANYL CITRATE (PF) 100 MCG/2ML IJ SOLN
INTRAMUSCULAR | Status: AC
  Filled 2019-03-26: qty 2

## 2019-03-26 MED ORDER — FENTANYL CITRATE (PF) 100 MCG/2ML IJ SOLN
INTRAMUSCULAR | Status: DC | PRN
  Administered 2019-03-26: 50 ug via INTRAVENOUS
  Administered 2019-03-26: 150 ug via INTRAVENOUS
  Administered 2019-03-26 (×7): 50 ug via INTRAVENOUS

## 2019-03-26 MED ORDER — OXYCODONE HCL 5 MG PO TABS
5.0000 mg | ORAL_TABLET | ORAL | Status: DC | PRN
  Administered 2019-03-26 – 2019-03-28 (×7): 10 mg via ORAL
  Filled 2019-03-26 (×6): qty 2

## 2019-03-26 MED ORDER — FENTANYL CITRATE (PF) 250 MCG/5ML IJ SOLN
INTRAMUSCULAR | Status: AC
  Filled 2019-03-26: qty 5

## 2019-03-26 MED ORDER — LABETALOL HCL 5 MG/ML IV SOLN
5.0000 mg | Freq: Once | INTRAVENOUS | Status: AC
  Administered 2019-03-26: 10:00:00 5 mg via INTRAVENOUS
  Filled 2019-03-26 (×2): qty 4

## 2019-03-26 MED ORDER — ACETAMINOPHEN 325 MG PO TABS
650.0000 mg | ORAL_TABLET | Freq: Four times a day (QID) | ORAL | Status: DC
  Administered 2019-03-26 – 2019-04-01 (×21): 650 mg via ORAL
  Filled 2019-03-26 (×23): qty 2

## 2019-03-26 MED ORDER — ACETAMINOPHEN 500 MG PO TABS
1000.0000 mg | ORAL_TABLET | Freq: Once | ORAL | Status: AC
  Administered 2019-03-26: 09:00:00 1000 mg via ORAL

## 2019-03-26 MED ORDER — FENTANYL CITRATE (PF) 100 MCG/2ML IJ SOLN
25.0000 ug | INTRAMUSCULAR | Status: DC | PRN
  Administered 2019-03-26 (×3): 50 ug via INTRAVENOUS

## 2019-03-26 MED ORDER — CEFAZOLIN SODIUM-DEXTROSE 2-4 GM/100ML-% IV SOLN
INTRAVENOUS | Status: AC
  Filled 2019-03-26: qty 100

## 2019-03-26 MED ORDER — DEXAMETHASONE SODIUM PHOSPHATE 10 MG/ML IJ SOLN
INTRAMUSCULAR | Status: DC | PRN
  Administered 2019-03-26: 8 mg via INTRAVENOUS

## 2019-03-26 MED ORDER — LACTATED RINGERS IV SOLN
INTRAVENOUS | Status: DC | PRN
  Administered 2019-03-26: 09:00:00 via INTRAVENOUS

## 2019-03-26 MED ORDER — LIDOCAINE 2% (20 MG/ML) 5 ML SYRINGE
INTRAMUSCULAR | Status: AC
  Filled 2019-03-26: qty 5

## 2019-03-26 MED ORDER — ALBUMIN HUMAN 5 % IV SOLN
INTRAVENOUS | Status: DC | PRN
  Administered 2019-03-26 (×2): via INTRAVENOUS

## 2019-03-26 MED ORDER — ROCURONIUM BROMIDE 10 MG/ML (PF) SYRINGE
PREFILLED_SYRINGE | INTRAVENOUS | Status: AC
  Filled 2019-03-26: qty 10

## 2019-03-26 MED ORDER — LABETALOL HCL 5 MG/ML IV SOLN
INTRAVENOUS | Status: DC | PRN
  Administered 2019-03-26: 5 mg via INTRAVENOUS

## 2019-03-26 MED ORDER — CEFAZOLIN SODIUM-DEXTROSE 2-4 GM/100ML-% IV SOLN
2.0000 g | Freq: Three times a day (TID) | INTRAVENOUS | Status: AC
  Administered 2019-03-26 – 2019-03-27 (×3): 2 g via INTRAVENOUS
  Filled 2019-03-26 (×3): qty 100

## 2019-03-26 MED ORDER — PROPOFOL 10 MG/ML IV BOLUS
INTRAVENOUS | Status: DC | PRN
  Administered 2019-03-26: 150 mg via INTRAVENOUS

## 2019-03-26 MED ORDER — ONDANSETRON HCL 4 MG/2ML IJ SOLN
INTRAMUSCULAR | Status: DC | PRN
  Administered 2019-03-26: 4 mg via INTRAVENOUS

## 2019-03-26 MED ORDER — ROCURONIUM BROMIDE 50 MG/5ML IV SOSY
PREFILLED_SYRINGE | INTRAVENOUS | Status: DC | PRN
  Administered 2019-03-26: 50 mg via INTRAVENOUS
  Administered 2019-03-26: 10 mg via INTRAVENOUS

## 2019-03-26 MED ORDER — LABETALOL HCL 5 MG/ML IV SOLN
5.0000 mg | Freq: Once | INTRAVENOUS | Status: AC
  Administered 2019-03-26: 13:00:00 5 mg via INTRAVENOUS

## 2019-03-26 MED ORDER — ALBUTEROL SULFATE HFA 108 (90 BASE) MCG/ACT IN AERS
INHALATION_SPRAY | RESPIRATORY_TRACT | Status: AC
  Filled 2019-03-26: qty 6.7

## 2019-03-26 MED ORDER — PROMETHAZINE HCL 25 MG/ML IJ SOLN: 6.2500 mg | INTRAMUSCULAR | Status: DC | PRN

## 2019-03-26 MED ORDER — SUGAMMADEX SODIUM 200 MG/2ML IV SOLN
INTRAVENOUS | Status: DC | PRN
  Administered 2019-03-26: 300 mg via INTRAVENOUS

## 2019-03-26 MED ORDER — GABAPENTIN 100 MG PO CAPS
100.0000 mg | ORAL_CAPSULE | Freq: Three times a day (TID) | ORAL | Status: DC
  Administered 2019-03-26 – 2019-03-31 (×14): 100 mg via ORAL
  Filled 2019-03-26 (×17): qty 1

## 2019-03-26 MED ORDER — SUCCINYLCHOLINE CHLORIDE 200 MG/10ML IV SOSY
PREFILLED_SYRINGE | INTRAVENOUS | Status: AC
  Filled 2019-03-26: qty 10

## 2019-03-26 MED ORDER — LIDOCAINE 2% (20 MG/ML) 5 ML SYRINGE
INTRAMUSCULAR | Status: DC | PRN
  Administered 2019-03-26: 60 mg via INTRAVENOUS
  Administered 2019-03-26: 40 mg via INTRAVENOUS

## 2019-03-26 MED ORDER — SUGAMMADEX SODIUM 500 MG/5ML IV SOLN
INTRAVENOUS | Status: AC
  Filled 2019-03-26: qty 5

## 2019-03-26 MED ORDER — PROPOFOL 10 MG/ML IV BOLUS
INTRAVENOUS | Status: AC
  Filled 2019-03-26: qty 20

## 2019-03-26 MED ORDER — VITAMIN D 25 MCG (1000 UNIT) PO TABS
2000.0000 [IU] | ORAL_TABLET | Freq: Two times a day (BID) | ORAL | Status: DC
  Administered 2019-03-27 – 2019-04-01 (×10): 2000 [IU] via ORAL
  Filled 2019-03-26 (×5): qty 2
  Filled 2019-03-26 (×4): qty 5
  Filled 2019-03-26 (×3): qty 2

## 2019-03-26 MED ORDER — SODIUM CHLORIDE 0.9 % IV SOLN
INTRAVENOUS | Status: DC | PRN
  Administered 2019-03-26: 60 ug/min via INTRAVENOUS

## 2019-03-26 MED ORDER — VANCOMYCIN HCL 1000 MG IV SOLR
INTRAVENOUS | Status: AC
  Filled 2019-03-26: qty 1000

## 2019-03-26 MED ORDER — CEFAZOLIN SODIUM-DEXTROSE 2-4 GM/100ML-% IV SOLN
2.0000 g | Freq: Once | INTRAVENOUS | Status: AC
  Administered 2019-03-26: 10:00:00 2 g via INTRAVENOUS

## 2019-03-26 MED ORDER — ENSURE ENLIVE PO LIQD
237.0000 mL | Freq: Two times a day (BID) | ORAL | Status: DC
  Administered 2019-03-27: 10:00:00 237 mL via ORAL
  Filled 2019-03-26: qty 237

## 2019-03-26 SURGICAL SUPPLY — 49 items
ADH SKN CLS APL DERMABOND .7 (GAUZE/BANDAGES/DRESSINGS) ×1
BIT DRILL CANN LRG QC 5X300 (BIT) ×2 IMPLANT
BIT DRILL FLUTED FEMUR 4.2/3 (BIT) ×2 IMPLANT
BLADE HELICAL TFNA TITAN 105 (Anchor) ×1 IMPLANT
BLADE HELICAL TFNA TITAN 105MM (Anchor) ×1 IMPLANT
BRUSH SCRUB SURG 4.25 DISP (MISCELLANEOUS) ×6 IMPLANT
CHLORAPREP W/TINT 26ML (MISCELLANEOUS) ×3 IMPLANT
COVER PERINEAL POST (MISCELLANEOUS) ×3 IMPLANT
COVER SURGICAL LIGHT HANDLE (MISCELLANEOUS) ×3 IMPLANT
DERMABOND ADVANCED (GAUZE/BANDAGES/DRESSINGS) ×2
DERMABOND ADVANCED .7 DNX12 (GAUZE/BANDAGES/DRESSINGS) ×1 IMPLANT
DRAPE IMP U-DRAPE 54X76 (DRAPES) ×6 IMPLANT
DRAPE INCISE IOBAN 66X45 STRL (DRAPES) ×3 IMPLANT
DRAPE STERI IOBAN 125X83 (DRAPES) ×3 IMPLANT
DRAPE SURG 17X23 STRL (DRAPES) ×4 IMPLANT
DRAPE U-SHAPE 47X51 STRL (DRAPES) ×3 IMPLANT
DRSG MEPILEX BORDER 4X4 (GAUZE/BANDAGES/DRESSINGS) ×4 IMPLANT
DRSG MEPILEX BORDER 4X8 (GAUZE/BANDAGES/DRESSINGS) ×2 IMPLANT
ELECT REM PT RETURN 9FT ADLT (ELECTROSURGICAL) ×3
ELECTRODE REM PT RTRN 9FT ADLT (ELECTROSURGICAL) ×1 IMPLANT
GLOVE BIO SURGEON STRL SZ 6.5 (GLOVE) ×6 IMPLANT
GLOVE BIO SURGEON STRL SZ7.5 (GLOVE) ×12 IMPLANT
GLOVE BIO SURGEONS STRL SZ 6.5 (GLOVE) ×3
GLOVE BIOGEL PI IND STRL 6.5 (GLOVE) ×1 IMPLANT
GLOVE BIOGEL PI IND STRL 7.5 (GLOVE) ×1 IMPLANT
GLOVE BIOGEL PI INDICATOR 6.5 (GLOVE) ×2
GLOVE BIOGEL PI INDICATOR 7.5 (GLOVE) ×2
GOWN STRL REUS W/ TWL LRG LVL3 (GOWN DISPOSABLE) ×1 IMPLANT
GOWN STRL REUS W/TWL LRG LVL3 (GOWN DISPOSABLE) ×9
GUIDEWIRE 3.2X400 (WIRE) ×4 IMPLANT
GUIDEWIRE THREADED 2.8 (WIRE) ×4 IMPLANT
KIT BASIN OR (CUSTOM PROCEDURE TRAY) ×3 IMPLANT
KIT TURNOVER KIT B (KITS) ×3 IMPLANT
NAIL CANN FEM RT 12X170 130D (Nail) ×2 IMPLANT
NDL 18GX1X1/2 (RX/OR ONLY) (NEEDLE) IMPLANT
NEEDLE 18GX1X1/2 (RX/OR ONLY) (NEEDLE) IMPLANT
NS IRRIG 1000ML POUR BTL (IV SOLUTION) ×3 IMPLANT
PACK GENERAL/GYN (CUSTOM PROCEDURE TRAY) ×3 IMPLANT
PAD ARMBOARD 7.5X6 YLW CONV (MISCELLANEOUS) ×8 IMPLANT
SCREW CANN TI 32 THRD 6.5X105 (Screw) ×2 IMPLANT
SCREW CANN TI 32 THRD 6.5X110 (Screw) ×2 IMPLANT
SCREW LOCK STAR 5X42 (Screw) ×2 IMPLANT
SUT MNCRL AB 3-0 PS2 18 (SUTURE) ×5 IMPLANT
SUT VIC AB 2-0 CT1 27 (SUTURE) ×3
SUT VIC AB 2-0 CT1 TAPERPNT 27 (SUTURE) ×2 IMPLANT
SYR 50ML SLIP (SYRINGE) IMPLANT
TOWEL OR 17X26 10 PK STRL BLUE (TOWEL DISPOSABLE) ×4 IMPLANT
WASHER 13.0MM (Orthopedic Implant) ×2 IMPLANT
WATER STERILE IRR 1000ML POUR (IV SOLUTION) ×3 IMPLANT
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
## 2019-03-26 NOTE — Progress Notes (Signed)
Attempted MRI 1145p 03/26/19, patient was in too much pain to be moved on to our table, did not want [place] to attempt to move him.  He tried to move himself but could not mobilize himself over.  He said he needed rest and pain to subside before he could do exam.  Informed RN we would try tomorrow
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
## 2019-03-26 NOTE — Social Work (Signed)
CSW acknowledging consult for "hip fracture". Will follow for therapy recommendations.    Octavio Graves, MSW, [facility] Clinical Social Work [phone]

## 2019-03-26 NOTE — Progress Notes (Addendum)
0855 Pt is A&O x4, NPO post mn maint. Pt signed consent for surgery. To short stay via bed. 1335 Received pt from PACU, sleepy. Denies pain at this time. Right hip incision with Mipilex dressing dry and intact. 1700 BP is still high, Dr Rhona Leavens notified, started on new BP meds.  Visible hand tremors noted, mildly anxious, on CIWA protocol, medicated. 1800 Pt is sleeping, easy to awaken.

## 2019-03-26 NOTE — Progress Notes (Signed)
PT Cancellation Note  Patient Details Name: TAN CHRETIEN MRN: [id number] DOB: 1976/08/23   Cancelled Treatment:    Reason Eval/Treat Not Completed: Medical issues which prohibited therapy. Spoke with pt's RN, pt with greatly elevated BP and likely withdrawing. Pt's RN stating tomorrow morning likely more appropriate for pt to participate in PT. PT will f/u with pt tomorrow.    Alessandra Bevels Haiden Clucas 03/26/2019, 4:10 PM
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
## 2019-03-26 NOTE — Anesthesia Postprocedure Evaluation (Signed)
Anesthesia Post Note  Patient: [name]  Procedure(s) Performed: INTRAMEDULLARY (IM) NAIL INTERTROCHANTRIC (Right )     Patient location during evaluation: PACU Anesthesia Type: General Level of consciousness: sedated Pain management: pain level controlled Vital Signs Assessment: post-procedure vital signs reviewed and stable Respiratory status: spontaneous breathing and respiratory function stable Cardiovascular status: stable Postop Assessment: no apparent nausea or vomiting Anesthetic complications: no    Last Vitals:  Vitals:   03/26/19 1300 03/26/19 1335  BP: (!) 167/94 (!) 137/107  Pulse: 80 76  Resp: 10   Temp: (!) 36.1 C   SpO2: 99% 99%    Last Pain:  Vitals:   03/26/19 1400  TempSrc:   PainSc: Asleep                 Richard Lopez

## 2019-03-26 NOTE — Progress Notes (Signed)
Notified Singer MD of patient's blood pressure of 169/107, verbal order to treat pain, give 5 mg of labetalol and recheck the blood pressure. Will continue to monitor.

## 2019-03-26 NOTE — Progress Notes (Signed)
Patient arrived to Short Stay shaking, hypertensive, and tachycardic. Dr. Krista Blue made aware medications given per order.
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
## 2019-03-26 NOTE — Anesthesia Procedure Notes (Signed)
Procedure Name: Intubation Date/Time: 03/26/2019 10:09 AM Performed by: Orlie Dakin, CRNA Pre-anesthesia Checklist: Patient identified, Emergency Drugs available, Suction available and Patient being monitored Patient Re-evaluated:Patient Re-evaluated prior to induction Oxygen Delivery Method: Circle system utilized Preoxygenation: Pre-oxygenation with 100% oxygen Induction Type: IV induction and Rapid sequence Laryngoscope Size: Mac and 4 Grade View: Grade I Tube type: Oral Tube size: 7.5 mm Number of attempts: 1 Airway Equipment and Method: Stylet Placement Confirmation: ETT inserted through vocal cords under direct vision,  positive ETCO2 and breath sounds checked- equal and bilateral Secured at: 24 cm Tube secured with: Tape Dental Injury: Teeth and Oropharynx as per pre-operative assessment  Comments: RSI due to Covid-19 pandemic concerns.  4x4s bite block used.

## 2019-03-26 NOTE — Anesthesia Preprocedure Evaluation (Addendum)
Anesthesia Evaluation  Patient identified by MRN, date of birth, ID band Patient awake    Reviewed: Allergy & Precautions, NPO status , Patient's Chart, lab work & pertinent test results  Airway Mallampati: II  TM Distance: >3 FB     Dental  (+) Poor Dentition, Loose, Missing   Pulmonary Current Smoker,    Pulmonary exam normal        Cardiovascular hypertension, Pt. on medications  Rhythm:Regular Rate:Tachycardia     Neuro/Psych PSYCHIATRIC DISORDERS Anxiety Depression negative neurological ROS     GI/Hepatic negative GI ROS, Neg liver ROS,   Endo/Other  diabetesHypothyroidism   Renal/GU      Musculoskeletal   Abdominal Normal abdominal exam  (+)   Peds  Hematology  (+) Blood dyscrasia, anemia ,   Anesthesia Other Findings   Reproductive/Obstetrics                            Anesthesia Physical  Anesthesia Plan  ASA: III  Anesthesia Plan: General   Post-op Pain Management:    Induction: Intravenous  PONV Risk Score and Plan: 2 and Ondansetron and Dexamethasone  Airway Management Planned: Oral ETT  Additional Equipment:   Intra-op Plan:   Post-operative Plan: Extubation in OR  Informed Consent:   Plan Discussed with:   Anesthesia Plan Comments:        Anesthesia Quick Evaluation

## 2019-03-26 NOTE — Transfer of Care (Signed)
Immediate Anesthesia Transfer of Care Note  Patient: Richard Lopez  Procedure(s) Performed: INTRAMEDULLARY (IM) NAIL INTERTROCHANTRIC (Right )  Patient Location: PACU  Anesthesia Type:General  Level of Consciousness: awake and patient cooperative  Airway & Oxygen Therapy: Patient Spontanous Breathing and Patient connected to nasal cannula oxygen  Post-op Assessment: Report given to RN and Post -op Vital signs reviewed and stable  Post vital signs: Reviewed and stable  Last Vitals:  Vitals Value Taken Time  BP 154/104 03/26/2019 12:12 PM  Temp    Pulse 84 03/26/2019 12:14 PM  Resp 18 03/26/2019 12:14 PM  SpO2 96 % 03/26/2019 12:14 PM  Vitals shown include unvalidated device data.  Last Pain:  Vitals:   03/26/19 0807  TempSrc:   PainSc: 10-Worst pain ever         Complications: No apparent anesthesia complications

## 2019-03-26 NOTE — Plan of Care (Signed)

## 2019-03-27 LAB — CBC
HCT: 21.5 % — ABNORMAL LOW (ref 39.0–52.0)
Hemoglobin: 7.5 g/dL — ABNORMAL LOW (ref 13.0–17.0)
MCH: 30.5 pg (ref 26.0–34.0)
MCHC: 34.9 g/dL (ref 30.0–36.0)
MCV: 87.4 fL (ref 80.0–100.0)
Platelets: 78 10*3/uL — ABNORMAL LOW (ref 150–400)
RBC: 2.46 MIL/uL — ABNORMAL LOW (ref 4.22–5.81)
RDW: 14.6 % (ref 11.5–15.5)
WBC: 6.6 10*3/uL (ref 4.0–10.5)
nRBC: 0 % (ref 0.0–0.2)

## 2019-03-27 LAB — BASIC METABOLIC PANEL
Anion gap: 12 (ref 5–15)
BUN: 9 mg/dL (ref 6–20)
CO2: 25 mmol/L (ref 22–32)
Calcium: 8.3 mg/dL — ABNORMAL LOW (ref 8.9–10.3)
Chloride: 96 mmol/L — ABNORMAL LOW (ref 98–111)
Creatinine, Ser: 0.98 mg/dL (ref 0.61–1.24)
GFR calc Af Amer: >60 mL/min (ref >60)
GFR calc non Af Amer: >60 mL/min (ref >60)
Glucose, Bld: 169 mg/dL — ABNORMAL HIGH (ref 70–99)
Potassium: 3.7 mmol/L (ref 3.5–5.1)
Sodium: 133 mmol/L — ABNORMAL LOW (ref 135–145)

## 2019-03-27 LAB — PARATHYROID HORMONE, INTACT (NO CA): PTH: 52 pg/mL (ref 15–65)

## 2019-03-27 LAB — CALCIUM, IONIZED: Calcium, Ionized, Serum: 4.6 mg/dL (ref 4.5–5.6)

## 2019-03-27 MED ORDER — ONDANSETRON HCL 4 MG/2ML IJ SOLN
4.0000 mg | Freq: Four times a day (QID) | INTRAMUSCULAR | Status: DC | PRN
  Filled 2019-03-27: qty 2
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
## 2019-03-27 NOTE — Progress Notes (Signed)
Pt vomited after drinking the ensure, given also pain med for right leg pain, felt dizzy according to therapy BP was 95/64, repositioned to head lower than the leg rechecked the BP 106/62, will continue to monitor.

## 2019-03-27 NOTE — Progress Notes (Signed)
Pt for MRI of his right knee rescheduled possible tonight or tomorrow as per MRI staff.

## 2019-03-27 NOTE — Progress Notes (Signed)
Patient ID: Richard Lopez, male   DOB: 1976/05/01, 43 y.o.   MRN: [id number]     Subjective:  Patient reports pain as mild to moderate.  Patient sitting at the bed side about to start PT.  Denies any CP or SOB  Objective:   VITALS:   Vitals:   03/26/19 1557 03/26/19 2017 03/27/19 0017 03/27/19 0424  BP: (!) 163/101 (!) 144/87 107/70 103/65  Pulse: 79 78 83 83  Resp:  12 14 16   Temp: 98.2 F (36.8 C) 98.1 F (36.7 C) 98.6 F (37 C) 98.5 F (36.9 C)  TempSrc: Oral Oral Oral Oral  SpO2: 99% 98% 98% 99%  Weight:      Height:        ABD soft Sensation intact distally Dorsiflexion/Plantar flexion intact Incision: dressing C/D/I and no drainage   Lab Results  Component Value Date   WBC 6.6 03/27/2019   HGB 7.5 (L) 03/27/2019   HCT 21.5 (L) 03/27/2019   MCV 87.4 03/27/2019   PLT 78 (L) 03/27/2019   BMET    Component Value Date/Time   NA 133 (L) 03/27/2019 0346   K 3.7 03/27/2019 0346   CL 96 (L) 03/27/2019 0346   CO2 25 03/27/2019 0346   GLUCOSE 169 (H) 03/27/2019 0346   BUN 9 03/27/2019 0346   CREATININE 0.98 03/27/2019 0346   CALCIUM 8.3 (L) 03/27/2019 0346   GFRNONAA >60 03/27/2019 0346   GFRAA >60 03/27/2019 0346     Assessment/Plan: 1 Day Post-Op   Principal Problem:   Closed right hip fracture, initial encounter (HCC) Active Problems:   Essential hypertension   Alcohol dependence with intoxication (HCC)   Hypertensive urgency   Thrombocytopenia (HCC)   Effusion, right knee   Closed fracture of right femur (HCC)   Advance diet Up with therapy Continue plan per medicine WBAT Dry dressing PRN   Nelda Severe 03/27/2019, 11:32 AM   Ramond Marrow MD
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
## 2019-03-27 NOTE — Progress Notes (Signed)
Pt pulled his IV line, trying to get out of bed, confused, redirect, on bed alarm.
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
## 2019-03-28 DIAGNOSIS — G9340 Encephalopathy, unspecified: Secondary | ICD-10-CM

## 2019-03-28 LAB — COMPREHENSIVE METABOLIC PANEL
ALT: 25 U/L (ref 0–44)
AST: 49 U/L — ABNORMAL HIGH (ref 15–41)
Albumin: 3.5 g/dL (ref 3.5–5.0)
Alkaline Phosphatase: 69 U/L (ref 38–126)
Anion gap: 11 (ref 5–15)
BUN: 12 mg/dL (ref 6–20)
CO2: 27 mmol/L (ref 22–32)
Calcium: 8.2 mg/dL — ABNORMAL LOW (ref 8.9–10.3)
Chloride: 94 mmol/L — ABNORMAL LOW (ref 98–111)
Creatinine, Ser: 1.3 mg/dL — ABNORMAL HIGH (ref 0.61–1.24)
GFR calc Af Amer: >60 mL/min (ref >60)
GFR calc non Af Amer: >60 mL/min (ref >60)
Glucose, Bld: 162 mg/dL — ABNORMAL HIGH (ref 70–99)
Potassium: 3.2 mmol/L — ABNORMAL LOW (ref 3.5–5.1)
Sodium: 132 mmol/L — ABNORMAL LOW (ref 135–145)
Total Bilirubin: 1 mg/dL (ref 0.3–1.2)
Total Protein: 6.4 g/dL — ABNORMAL LOW (ref 6.5–8.1)

## 2019-03-28 LAB — VITAMIN D 25 HYDROXY (VIT D DEFICIENCY, FRACTURES): Vit D, 25-Hydroxy: 4.6 ng/mL — ABNORMAL LOW (ref 30.0–100.0)

## 2019-03-28 LAB — PHOSPHORUS: Phosphorus: 2.9 mg/dL (ref 2.5–4.6)

## 2019-03-28 LAB — CBC
HCT: 21.4 % — ABNORMAL LOW (ref 39.0–52.0)
Hemoglobin: 7.3 g/dL — ABNORMAL LOW (ref 13.0–17.0)
MCH: 30.4 pg (ref 26.0–34.0)
MCHC: 34.1 g/dL (ref 30.0–36.0)
MCV: 89.2 fL (ref 80.0–100.0)
Platelets: 83 10*3/uL — ABNORMAL LOW (ref 150–400)
RBC: 2.4 MIL/uL — ABNORMAL LOW (ref 4.22–5.81)
RDW: 14.8 % (ref 11.5–15.5)
WBC: 7.7 10*3/uL (ref 4.0–10.5)
nRBC: 0 % (ref 0.0–0.2)

## 2019-03-28 LAB — MAGNESIUM: Magnesium: 1.3 mg/dL — ABNORMAL LOW (ref 1.7–2.4)

## 2019-03-28 LAB — CALCITRIOL (1,25 DI-OH VIT D): Vit D, 1,25-Dihydroxy: 27.8 pg/mL (ref 19.9–79.3)

## 2019-03-28 MED ORDER — MAGNESIUM SULFATE 4 GM/100ML IV SOLN
4.0000 g | Freq: Once | INTRAVENOUS | Status: AC
  Administered 2019-03-28: 16:00:00 4 g via INTRAVENOUS
  Filled 2019-03-28: qty 100

## 2019-03-28 MED ORDER — SODIUM CHLORIDE 0.9 % IV SOLN
INTRAVENOUS | Status: DC
  Administered 2019-03-29 (×2): via INTRAVENOUS

## 2019-03-28 MED ORDER — DEXMEDETOMIDINE HCL IN NACL 400 MCG/100ML IV SOLN
0.4000 ug/kg/h | INTRAVENOUS | Status: DC
  Administered 2019-03-28: 21:00:00 0.7 ug/kg/h via INTRAVENOUS
  Administered 2019-03-28: 15:00:00 0.4 ug/kg/h via INTRAVENOUS
  Administered 2019-03-29: 1 ug/kg/h via INTRAVENOUS
  Administered 2019-03-29: 0.9 ug/kg/h via INTRAVENOUS
  Filled 2019-03-28 (×4): qty 100

## 2019-03-28 MED ORDER — HALOPERIDOL LACTATE 5 MG/ML IJ SOLN
INTRAMUSCULAR | Status: AC
  Filled 2019-03-28: qty 1

## 2019-03-28 MED ORDER — ASPIRIN EC 325 MG PO TBEC: 325.0000 mg | DELAYED_RELEASE_TABLET | Freq: Every day | ORAL | 0 refills | Status: DC

## 2019-03-28 MED ORDER — CHOLECALCIFEROL 125 MCG (5000 UT) PO CAPS: 5000.0000 [IU] | ORAL_CAPSULE | Freq: Every day | ORAL | 1 refills | Status: DC

## 2019-03-28 MED ORDER — OXYCODONE HCL 5 MG PO TABS: 5.0000 mg | ORAL_TABLET | ORAL | 0 refills | Status: DC | PRN

## 2019-03-28 MED ORDER — LORAZEPAM 2 MG/ML IJ SOLN
2.0000 mg | INTRAMUSCULAR | Status: DC | PRN
  Administered 2019-03-28 (×4): 2 mg via INTRAVENOUS
  Filled 2019-03-28 (×4): qty 1

## 2019-03-28 MED ORDER — DEXMEDETOMIDINE HCL IN NACL 200 MCG/50ML IV SOLN
0.4000 ug/kg/h | INTRAVENOUS | Status: DC
  Filled 2019-03-28: qty 50

## 2019-03-28 MED ORDER — HALOPERIDOL LACTATE 5 MG/ML IJ SOLN
5.0000 mg | Freq: Once | INTRAMUSCULAR | Status: AC
  Administered 2019-03-28: 5 mg via INTRAVENOUS

## 2019-03-28 MED ORDER — POTASSIUM CHLORIDE CRYS ER 20 MEQ PO TBCR
40.0000 meq | EXTENDED_RELEASE_TABLET | Freq: Once | ORAL | Status: AC
  Administered 2019-03-28: 40 meq via ORAL
  Filled 2019-03-28: qty 2
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
## 2019-03-28 NOTE — Progress Notes (Signed)
RN spoke with patients Mother, Tyrell Starck, and updated her on patient's status and being moved to another floor.
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
## 2019-03-28 NOTE — Progress Notes (Signed)
Patient ID: Richard Lopez, male   DOB: [birth year], 43 y.o.   MRN: [id number]     Subjective:  Patient reports pain as mild to moderate.  In bed and in no acute distress somewhat agitated   Objective:   VITALS:   Vitals:   03/27/19 1436 03/27/19 1937 03/27/19 2131 03/28/19 0502  BP: 114/67 106/69 100/67 113/73  Pulse: 82 90 99 96  Resp: 17 16 20 16   Temp: 97.7 F (36.5 C) 97.8 F (36.6 C) (!) 97.4 F (36.3 C) 97.9 F (36.6 C)  TempSrc: Oral Oral Oral Oral  SpO2: 100% 100% 100% 97%  Weight:      Height:        ABD soft Sensation intact distally Dorsiflexion/Plantar flexion intact Incision: dressing C/D/I and no drainage   Lab Results  Component Value Date   WBC 7.7 03/28/2019   HGB 7.3 (L) 03/28/2019   HCT 21.4 (L) 03/28/2019   MCV 89.2 03/28/2019   PLT 83 (L) 03/28/2019   BMET    Component Value Date/Time   NA 132 (L) 03/28/2019 0425   K 3.2 (L) 03/28/2019 0425   CL 94 (L) 03/28/2019 0425   CO2 27 03/28/2019 0425   GLUCOSE 162 (H) 03/28/2019 0425   BUN 12 03/28/2019 0425   CREATININE 1.30 (H) 03/28/2019 0425   CALCIUM 8.2 (L) 03/28/2019 0425   GFRNONAA >60 03/28/2019 0425   GFRAA >60 03/28/2019 0425     Assessment/Plan: 2 Days Post-Op   Principal Problem:   Closed right hip fracture, initial encounter (HCC) Active Problems:   Essential hypertension   Alcohol dependence with intoxication (HCC)   Hypertensive urgency   Thrombocytopenia (HCC)   Effusion, right knee   Closed fracture of right femur (HCC)    Advance diet Up with therapy Continue plan per CCU WBAT  Dry dressing PRN    Nelda Severe 03/28/2019, 1:58 PM   Teryl Lucy, MD Cell [phone]

## 2019-03-28 NOTE — Progress Notes (Signed)
OT Cancellation Note  Patient Details Name: Richard Lopez MRN: [id number] DOB: 12/11/1975   Cancelled Treatment:    Reason Eval/Treat Not Completed: Other (comment)(combative at this time)  Mateo Flow \  Mateo Flow, OTR/L  Acute Rehabilitation Services Pager: [phone] Office: [phone] .  03/28/2019, 9:55 AM

## 2019-03-28 NOTE — Progress Notes (Signed)
PT Cancellation Note  Patient Details Name: Richard Lopez MRN: [id number] DOB: 02-Jul-1976   Cancelled Treatment:    Reason Eval/Treat Not Completed: Other (comment)(combative). Will check back periodically to see if he is more appropriate.   Lyanne Co, PT  Acute Rehab Services  Pager [phone] Office [phone]    Lawana Chambers [name] 03/28/2019, 11:25 AM
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
## 2019-03-28 NOTE — Progress Notes (Signed)
Pt  tried to get off bed again multiple times, got order for sitter, family notified, nurse called MRI to remind them about patient order. Pain medicine given ,last CIWA was 3 , will continue to monitor.

## 2019-03-29 ENCOUNTER — Encounter (HOSPITAL_COMMUNITY): Payer: Self-pay | Admitting: Student

## 2019-03-29 ENCOUNTER — Inpatient Hospital Stay (HOSPITAL_COMMUNITY): Payer: Self-pay

## 2019-03-29 DIAGNOSIS — I160 Hypertensive urgency: Secondary | ICD-10-CM

## 2019-03-29 DIAGNOSIS — S72001A Fracture of unspecified part of neck of right femur, initial encounter for closed fracture: Secondary | ICD-10-CM

## 2019-03-29 DIAGNOSIS — R001 Bradycardia, unspecified: Secondary | ICD-10-CM

## 2019-03-29 DIAGNOSIS — S72144D Nondisplaced intertrochanteric fracture of right femur, subsequent encounter for closed fracture with routine healing: Secondary | ICD-10-CM

## 2019-03-29 DIAGNOSIS — T8851XA Hypothermia following anesthesia, initial encounter: Secondary | ICD-10-CM

## 2019-03-29 DIAGNOSIS — F10229 Alcohol dependence with intoxication, unspecified: Secondary | ICD-10-CM

## 2019-03-29 DIAGNOSIS — F10231 Alcohol dependence with withdrawal delirium: Secondary | ICD-10-CM

## 2019-03-29 DIAGNOSIS — I10 Essential (primary) hypertension: Secondary | ICD-10-CM

## 2019-03-29 DIAGNOSIS — D696 Thrombocytopenia, unspecified: Secondary | ICD-10-CM

## 2019-03-29 LAB — BASIC METABOLIC PANEL
Anion gap: 10 (ref 5–15)
Anion gap: 11 (ref 5–15)
BUN: 14 mg/dL (ref 6–20)
BUN: 15 mg/dL (ref 6–20)
CO2: 26 mmol/L (ref 22–32)
CO2: 26 mmol/L (ref 22–32)
Calcium: 8.3 mg/dL — ABNORMAL LOW (ref 8.9–10.3)
Calcium: 8.4 mg/dL — ABNORMAL LOW (ref 8.9–10.3)
Chloride: 95 mmol/L — ABNORMAL LOW (ref 98–111)
Chloride: 97 mmol/L — ABNORMAL LOW (ref 98–111)
Creatinine, Ser: 1.01 mg/dL (ref 0.61–1.24)
Creatinine, Ser: 0.93 mg/dL (ref 0.61–1.24)
GFR calc Af Amer: >60 mL/min (ref >60)
GFR calc Af Amer: >60 mL/min (ref >60)
GFR calc non Af Amer: >60 mL/min (ref >60)
GFR calc non Af Amer: >60 mL/min (ref >60)
Glucose, Bld: 163 mg/dL — ABNORMAL HIGH (ref 70–99)
Glucose, Bld: 146 mg/dL — ABNORMAL HIGH (ref 70–99)
Potassium: 3.5 mmol/L (ref 3.5–5.1)
Potassium: 3.7 mmol/L (ref 3.5–5.1)
Sodium: 131 mmol/L — ABNORMAL LOW (ref 135–145)
Sodium: 134 mmol/L — ABNORMAL LOW (ref 135–145)

## 2019-03-29 LAB — CBC WITH DIFFERENTIAL/PLATELET
Abs Immature Granulocytes: 0.01 10*3/uL (ref 0.00–0.07)
Basophils Absolute: 0 10*3/uL (ref 0.0–0.1)
Basophils Relative: 1 %
Eosinophils Absolute: 0 10*3/uL (ref 0.0–0.5)
Eosinophils Relative: 1 %
HCT: 19.8 % — ABNORMAL LOW (ref 39.0–52.0)
Hemoglobin: 6.8 g/dL — CL (ref 13.0–17.0)
Immature Granulocytes: 0 %
Lymphocytes Relative: 32 %
Lymphs Abs: 1.2 10*3/uL (ref 0.7–4.0)
MCH: 30.8 pg (ref 26.0–34.0)
MCHC: 34.3 g/dL (ref 30.0–36.0)
MCV: 89.6 fL (ref 80.0–100.0)
Monocytes Absolute: 0.4 10*3/uL (ref 0.1–1.0)
Monocytes Relative: 11 %
Neutro Abs: 2.1 10*3/uL (ref 1.7–7.7)
Neutrophils Relative %: 55 %
Platelets: 83 10*3/uL — ABNORMAL LOW (ref 150–400)
RBC: 2.21 MIL/uL — ABNORMAL LOW (ref 4.22–5.81)
RDW: 14.2 % (ref 11.5–15.5)
WBC: 3.7 10*3/uL — ABNORMAL LOW (ref 4.0–10.5)
nRBC: 0 % (ref 0.0–0.2)

## 2019-03-29 LAB — LUTEINIZING HORMONE: LH: 4.4 m[IU]/mL (ref 1.7–8.6)

## 2019-03-29 LAB — FOLLICLE STIMULATING HORMONE: FSH: 13.5 m[IU]/mL — ABNORMAL HIGH (ref 1.5–12.4)

## 2019-03-29 LAB — LACTIC ACID, PLASMA: Lactic Acid, Venous: 0.8 mmol/L (ref 0.5–1.9)

## 2019-03-29 LAB — PREPARE RBC (CROSSMATCH)

## 2019-03-29 LAB — MAGNESIUM: Magnesium: 2.2 mg/dL (ref 1.7–2.4)

## 2019-03-29 LAB — TROPONIN I: Troponin I: <0.03 ng/mL (ref <0.03)

## 2019-03-29 LAB — PROLACTIN: Prolactin: 4.3 ng/mL (ref 4.0–15.2)

## 2019-03-29 LAB — TESTOSTERONE,FREE AND TOTAL
Testosterone, Free: 6.9 pg/mL (ref 6.8–21.5)
Testosterone: 181 ng/dL — ABNORMAL LOW (ref 264–916)

## 2019-03-29 MED ORDER — VITAMIN D (ERGOCALCIFEROL) 1.25 MG (50000 UNIT) PO CAPS
50000.0000 [IU] | ORAL_CAPSULE | ORAL | Status: DC
  Administered 2019-03-29: 50000 [IU] via ORAL
  Filled 2019-03-29: qty 1

## 2019-03-29 MED ORDER — SODIUM CHLORIDE 0.9% IV SOLUTION: Freq: Once | INTRAVENOUS | Status: DC

## 2019-03-29 MED ORDER — CHLORHEXIDINE GLUCONATE CLOTH 2 % EX PADS
6.0000 | MEDICATED_PAD | Freq: Every day | CUTANEOUS | Status: DC
  Administered 2019-03-29 – 2019-04-01 (×2): 6 via TOPICAL

## 2019-03-29 NOTE — Progress Notes (Signed)
eLink Physician-Brief Progress Note Patient Name: Richard Lopez DOB: Aug 23, 1976 MRN: [id number]   Date of Service  03/29/2019  HPI/Events of Note  Anemia - Hgb = 6.8.  eICU Interventions  Will transfuse 1 unit PRBC.     Intervention Category Major Interventions: Other:  Lenell Antu 03/29/2019, 4:39 AM

## 2019-03-29 NOTE — Progress Notes (Signed)
I have called Physical therapy to work with Richard Lopez today
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
## 2019-03-29 NOTE — Progress Notes (Signed)
CRITICAL VALUE ALERT  Critical Value:  hgb-6.8  Date & Time Notied:  03/29/19 0425  Provider Notified: Dr. Arsenio Loader  Orders Received/Actions taken: Transfuse 1unit PRBC
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
## 2019-03-29 NOTE — Progress Notes (Signed)
Patient transferred to MRI via bed with SWOT RN

## 2019-03-30 LAB — TYPE AND SCREEN
ABO/RH(D): O POS
Antibody Screen: NEGATIVE
Unit division: 0

## 2019-03-30 LAB — CBC WITH DIFFERENTIAL/PLATELET
Abs Immature Granulocytes: 0.03 10*3/uL (ref 0.00–0.07)
Basophils Absolute: 0 10*3/uL (ref 0.0–0.1)
Basophils Relative: 1 %
Eosinophils Absolute: 0.1 10*3/uL (ref 0.0–0.5)
Eosinophils Relative: 1 %
HCT: 22.3 % — ABNORMAL LOW (ref 39.0–52.0)
Hemoglobin: 7.6 g/dL — ABNORMAL LOW (ref 13.0–17.0)
Immature Granulocytes: 1 %
Lymphocytes Relative: 19 %
Lymphs Abs: 1.2 10*3/uL (ref 0.7–4.0)
MCH: 30.2 pg (ref 26.0–34.0)
MCHC: 34.1 g/dL (ref 30.0–36.0)
MCV: 88.5 fL (ref 80.0–100.0)
Monocytes Absolute: 1 10*3/uL (ref 0.1–1.0)
Monocytes Relative: 15 %
Neutro Abs: 4.1 10*3/uL (ref 1.7–7.7)
Neutrophils Relative %: 63 %
Platelets: 128 10*3/uL — ABNORMAL LOW (ref 150–400)
RBC: 2.52 MIL/uL — ABNORMAL LOW (ref 4.22–5.81)
RDW: 14.3 % (ref 11.5–15.5)
WBC: 6.4 10*3/uL (ref 4.0–10.5)
nRBC: 0.3 % — ABNORMAL HIGH (ref 0.0–0.2)

## 2019-03-30 LAB — BPAM RBC
Blood Product Expiration Date: 202006232359
ISSUE DATE / TIME: 202005260629
Unit Type and Rh: 5100

## 2019-03-30 MED ORDER — SODIUM CHLORIDE 0.9 % IV SOLN
510.0000 mg | Freq: Once | INTRAVENOUS | Status: AC
  Administered 2019-03-30: 510 mg via INTRAVENOUS
  Filled 2019-03-30: qty 17

## 2019-03-30 MED ORDER — FUROSEMIDE 40 MG PO TABS
20.0000 mg | ORAL_TABLET | Freq: Every day | ORAL | Status: AC
  Administered 2019-03-30: 20 mg via ORAL
  Filled 2019-03-30: qty 1

## 2019-03-30 NOTE — Progress Notes (Signed)
I have called physical therapy  Again to work with [name] . I also have talked with Dr Jena Gauss PA to reorder physical therapy
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
## 2019-03-31 LAB — CBC WITH DIFFERENTIAL/PLATELET
Abs Immature Granulocytes: 0.03 10*3/uL (ref 0.00–0.07)
Basophils Absolute: 0 10*3/uL (ref 0.0–0.1)
Basophils Relative: 0 %
Eosinophils Absolute: 0.1 10*3/uL (ref 0.0–0.5)
Eosinophils Relative: 1 %
HCT: 22.9 % — ABNORMAL LOW (ref 39.0–52.0)
Hemoglobin: 7.9 g/dL — ABNORMAL LOW (ref 13.0–17.0)
Immature Granulocytes: 0 %
Lymphocytes Relative: 22 %
Lymphs Abs: 1.6 10*3/uL (ref 0.7–4.0)
MCH: 30.4 pg (ref 26.0–34.0)
MCHC: 34.5 g/dL (ref 30.0–36.0)
MCV: 88.1 fL (ref 80.0–100.0)
Monocytes Absolute: 1.1 10*3/uL — ABNORMAL HIGH (ref 0.1–1.0)
Monocytes Relative: 16 %
Neutro Abs: 4.3 10*3/uL (ref 1.7–7.7)
Neutrophils Relative %: 61 %
Platelets: 170 10*3/uL (ref 150–400)
RBC: 2.6 MIL/uL — ABNORMAL LOW (ref 4.22–5.81)
RDW: 14.2 % (ref 11.5–15.5)
WBC: 7.1 10*3/uL (ref 4.0–10.5)
nRBC: 0 % (ref 0.0–0.2)

## 2019-03-31 LAB — BASIC METABOLIC PANEL
Anion gap: 12 (ref 5–15)
Anion gap: 11 (ref 5–15)
BUN: 8 mg/dL (ref 6–20)
BUN: 5 mg/dL — ABNORMAL LOW (ref 6–20)
CO2: 24 mmol/L (ref 22–32)
CO2: 26 mmol/L (ref 22–32)
Calcium: 8.3 mg/dL — ABNORMAL LOW (ref 8.9–10.3)
Calcium: 8.6 mg/dL — ABNORMAL LOW (ref 8.9–10.3)
Chloride: 98 mmol/L (ref 98–111)
Chloride: 97 mmol/L — ABNORMAL LOW (ref 98–111)
Creatinine, Ser: 0.68 mg/dL (ref 0.61–1.24)
Creatinine, Ser: 0.75 mg/dL (ref 0.61–1.24)
GFR calc Af Amer: >60 mL/min (ref >60)
GFR calc Af Amer: >60 mL/min (ref >60)
GFR calc non Af Amer: >60 mL/min (ref >60)
GFR calc non Af Amer: >60 mL/min (ref >60)
Glucose, Bld: 133 mg/dL — ABNORMAL HIGH (ref 70–99)
Glucose, Bld: 193 mg/dL — ABNORMAL HIGH (ref 70–99)
Potassium: 2.9 mmol/L — ABNORMAL LOW (ref 3.5–5.1)
Potassium: 3.2 mmol/L — ABNORMAL LOW (ref 3.5–5.1)
Sodium: 134 mmol/L — ABNORMAL LOW (ref 135–145)
Sodium: 134 mmol/L — ABNORMAL LOW (ref 135–145)

## 2019-03-31 LAB — CBC
HCT: 23.9 % — ABNORMAL LOW (ref 39.0–52.0)
Hemoglobin: 8.2 g/dL — ABNORMAL LOW (ref 13.0–17.0)
MCH: 31.1 pg (ref 26.0–34.0)
MCHC: 34.3 g/dL (ref 30.0–36.0)
MCV: 90.5 fL (ref 80.0–100.0)
Platelets: 200 10*3/uL (ref 150–400)
RBC: 2.64 MIL/uL — ABNORMAL LOW (ref 4.22–5.81)
RDW: 14.9 % (ref 11.5–15.5)
WBC: 7.2 10*3/uL (ref 4.0–10.5)
nRBC: 0 % (ref 0.0–0.2)

## 2019-03-31 MED ORDER — POTASSIUM CHLORIDE CRYS ER 20 MEQ PO TBCR
30.0000 meq | EXTENDED_RELEASE_TABLET | ORAL | Status: DC
  Administered 2019-03-31: 30 meq via ORAL
  Filled 2019-03-31: qty 2

## 2019-03-31 MED ORDER — LORAZEPAM 2 MG/ML IJ SOLN
0.5000 mg | INTRAMUSCULAR | Status: DC | PRN
  Administered 2019-03-31: 1 mg via INTRAVENOUS
  Filled 2019-03-31: qty 1

## 2019-03-31 MED ORDER — POTASSIUM CHLORIDE CRYS ER 20 MEQ PO TBCR
40.0000 meq | EXTENDED_RELEASE_TABLET | Freq: Once | ORAL | Status: AC
  Administered 2019-03-31: 40 meq via ORAL
  Filled 2019-03-31: qty 2
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
## 2019-03-31 NOTE — Progress Notes (Signed)
Patient in bed at this time. CIWA score of 8. Patient is anxious and states that "I feel like my heart is racing". Pt is  alert and verbal. Answers questions appropriately. VS stable. MD paged, order received for Ativan. Will cont to monitor.
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
## 2019-04-01 LAB — BASIC METABOLIC PANEL
Anion gap: 10 (ref 5–15)
BUN: 8 mg/dL (ref 6–20)
CO2: 25 mmol/L (ref 22–32)
Calcium: 8.6 mg/dL — ABNORMAL LOW (ref 8.9–10.3)
Chloride: 98 mmol/L (ref 98–111)
Creatinine, Ser: 0.75 mg/dL (ref 0.61–1.24)
GFR calc Af Amer: >60 mL/min (ref >60)
GFR calc non Af Amer: >60 mL/min (ref >60)
Glucose, Bld: 172 mg/dL — ABNORMAL HIGH (ref 70–99)
Potassium: 3.3 mmol/L — ABNORMAL LOW (ref 3.5–5.1)
Sodium: 133 mmol/L — ABNORMAL LOW (ref 135–145)

## 2019-04-01 LAB — HEMOGLOBIN A1C
Hgb A1c MFr Bld: 5.4 % (ref 4.8–5.6)
Mean Plasma Glucose: 108.28 mg/dL

## 2019-04-01 MED ORDER — BOOST / RESOURCE BREEZE PO LIQD CUSTOM
1.0000 | Freq: Three times a day (TID) | ORAL | Status: DC
  Administered 2019-04-01 (×2): 1 via ORAL

## 2019-04-01 MED ORDER — OXYCODONE HCL 5 MG PO TABS: 5.0000 mg | ORAL_TABLET | Freq: Four times a day (QID) | ORAL | 0 refills | Status: DC | PRN

## 2019-04-01 MED ORDER — VITAMIN D 25 MCG (1000 UNIT) PO TABS: 2000.0000 [IU] | ORAL_TABLET | Freq: Two times a day (BID) | ORAL | 0 refills | Status: DC

## 2019-04-01 MED ORDER — LISINOPRIL 10 MG PO TABS: 10.0000 mg | ORAL_TABLET | Freq: Every day | ORAL | 0 refills | Status: DC

## 2019-04-01 MED ORDER — OXYCODONE HCL 5 MG PO TABS
5.0000 mg | ORAL_TABLET | Freq: Four times a day (QID) | ORAL | Status: DC | PRN
  Administered 2019-04-01: 5 mg via ORAL
  Filled 2019-04-01: qty 1

## 2019-04-01 MED ORDER — VITAMIN D (ERGOCALCIFEROL) 1.25 MG (50000 UNIT) PO CAPS: 50000.0000 [IU] | ORAL_CAPSULE | ORAL | 0 refills | Status: AC

## 2019-04-01 MED ORDER — ASPIRIN EC 325 MG PO TBEC: 325.0000 mg | DELAYED_RELEASE_TABLET | Freq: Every day | ORAL | 0 refills | Status: AC

## 2019-04-01 MED ORDER — SENNOSIDES-DOCUSATE SODIUM 8.6-50 MG PO TABS: 1.0000 | ORAL_TABLET | Freq: Every evening | ORAL | 0 refills | Status: DC | PRN

## 2019-04-01 MED ORDER — MELATONIN 5 MG PO CAPS: 5.0000 mg | ORAL_CAPSULE | Freq: Every evening | ORAL | 0 refills | Status: DC | PRN

## 2019-04-01 MED ORDER — POTASSIUM CHLORIDE CRYS ER 20 MEQ PO TBCR
40.0000 meq | EXTENDED_RELEASE_TABLET | Freq: Once | ORAL | Status: AC
  Administered 2019-04-01: 40 meq via ORAL
  Filled 2019-04-01: qty 2

## 2019-04-01 MED FILL — ASPIRIN EC 325 MG TABLET: 325 | 30 days supply | Qty: 30 | Fill #0

## 2019-04-01 MED FILL — SENNA S 8.6-50 MG TABS: 8.6-50 | 20 days supply | Qty: 20 | Fill #0

## 2019-04-01 MED FILL — MELATONIN 3 MG TABS: 3 | 15 days supply | Qty: 15 | Fill #0

## 2019-04-01 MED FILL — LISINOPRIL 10 MG TABS: 10 | 30 days supply | Qty: 30 | Fill #0

## 2019-04-01 MED FILL — oxyCODONE HCL 5 MG TABS: 5 | 7 days supply | Qty: 30 | Fill #0

## 2019-04-01 MED FILL — VIT D2 1.25 MG (50,000 UNIT: 1.25 MG | 32 days supply | Qty: 5 | Fill #0

## 2019-04-01 MED FILL — VITAMIN D3 1,000 UNIT TAB: 25 MCG | 7 days supply | Qty: 30 | Fill #0
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
## 2019-04-01 NOTE — TOC Transition Note (Signed)
Transition of Care [facility]) - CM/SW Discharge Note   Patient Details  Name: Richard Lopez MRN: [id number] Date of Birth: [birth year]  Transition of Care [facility]) CM/SW Contact:  Kingsley Plan, RN Phone Number: 04/01/2019, 12:15 PM   Clinical Narrative:     Patient entered in MATCH to cover Zestril and Oxycodone . Will get money from petty cash CM to cover OTC.  Final next level of care: Home w Home Health Services Barriers to Discharge: No Barriers Identified   Patient Goals and CMS Choice Patient states their goals for this hospitalization and ongoing recovery are:: to go home  CMS Medicare.gov Compare Post Acute Care list provided to:: Patient Choice offered to / list presented to : Patient  Discharge Placement                       Discharge Plan and Services   Discharge Planning Services: CM Consult Post Acute Care Choice: Home Health          DME Arranged: N/A DME Agency: NA       HH Arranged: PT HH Agency: [facility] Date [facility] Agency Contacted: 03/31/19 Time HH Agency Contacted: [phone] Representative spoke with at [facility] Agency: left message for [employer] [facility] (SDOH) Interventions     Readmission Risk Interventions No flowsheet data found.
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
## 2019-04-07 ENCOUNTER — Emergency Department (HOSPITAL_COMMUNITY): Payer: Self-pay

## 2019-04-07 ENCOUNTER — Other Ambulatory Visit: Payer: Self-pay

## 2019-04-07 ENCOUNTER — Inpatient Hospital Stay (HOSPITAL_COMMUNITY)
Admission: EM | Admit: 2019-04-07 | Discharge: 2019-04-15 | DRG: 896 | Disposition: A | Discharge status: Home / Self Care | Payer: Self-pay | Attending: Internal Medicine | Admitting: Internal Medicine

## 2019-04-07 ENCOUNTER — Encounter (HOSPITAL_COMMUNITY): Payer: Self-pay | Admitting: Emergency Medicine

## 2019-04-07 DIAGNOSIS — M109 Gout, unspecified: Secondary | ICD-10-CM | POA: Diagnosis present

## 2019-04-07 DIAGNOSIS — F419 Anxiety disorder, unspecified: Secondary | ICD-10-CM | POA: Diagnosis present

## 2019-04-07 DIAGNOSIS — K7010 Alcoholic hepatitis without ascites: Secondary | ICD-10-CM | POA: Diagnosis present

## 2019-04-07 DIAGNOSIS — X58XXXA Exposure to other specified factors, initial encounter: Secondary | ICD-10-CM | POA: Diagnosis present

## 2019-04-07 DIAGNOSIS — M25551 Pain in right hip: Secondary | ICD-10-CM

## 2019-04-07 DIAGNOSIS — J9600 Acute respiratory failure, unspecified whether with hypoxia or hypercapnia: Secondary | ICD-10-CM

## 2019-04-07 DIAGNOSIS — Z79899 Other long term (current) drug therapy: Secondary | ICD-10-CM

## 2019-04-07 DIAGNOSIS — D638 Anemia in other chronic diseases classified elsewhere: Secondary | ICD-10-CM | POA: Diagnosis present

## 2019-04-07 DIAGNOSIS — F10931 Alcohol use, unspecified with withdrawal delirium: Secondary | ICD-10-CM | POA: Diagnosis present

## 2019-04-07 DIAGNOSIS — F10231 Alcohol dependence with withdrawal delirium: Principal | ICD-10-CM

## 2019-04-07 DIAGNOSIS — M069 Rheumatoid arthritis, unspecified: Secondary | ICD-10-CM | POA: Diagnosis present

## 2019-04-07 DIAGNOSIS — G9340 Encephalopathy, unspecified: Secondary | ICD-10-CM | POA: Diagnosis present

## 2019-04-07 DIAGNOSIS — S72141A Displaced intertrochanteric fracture of right femur, initial encounter for closed fracture: Secondary | ICD-10-CM

## 2019-04-07 DIAGNOSIS — R7303 Prediabetes: Secondary | ICD-10-CM | POA: Diagnosis present

## 2019-04-07 DIAGNOSIS — F1010 Alcohol abuse, uncomplicated: Secondary | ICD-10-CM

## 2019-04-07 DIAGNOSIS — Z833 Family history of diabetes mellitus: Secondary | ICD-10-CM

## 2019-04-07 DIAGNOSIS — J9691 Respiratory failure, unspecified with hypoxia: Secondary | ICD-10-CM | POA: Diagnosis present

## 2019-04-07 DIAGNOSIS — E559 Vitamin D deficiency, unspecified: Secondary | ICD-10-CM

## 2019-04-07 DIAGNOSIS — M1990 Unspecified osteoarthritis, unspecified site: Secondary | ICD-10-CM | POA: Diagnosis present

## 2019-04-07 DIAGNOSIS — R7989 Other specified abnormal findings of blood chemistry: Secondary | ICD-10-CM

## 2019-04-07 DIAGNOSIS — F329 Major depressive disorder, single episode, unspecified: Secondary | ICD-10-CM | POA: Diagnosis present

## 2019-04-07 DIAGNOSIS — I10 Essential (primary) hypertension: Secondary | ICD-10-CM | POA: Diagnosis present

## 2019-04-07 DIAGNOSIS — Z1159 Encounter for screening for other viral diseases: Secondary | ICD-10-CM

## 2019-04-07 DIAGNOSIS — B999 Unspecified infectious disease: Secondary | ICD-10-CM

## 2019-04-07 DIAGNOSIS — E876 Hypokalemia: Secondary | ICD-10-CM | POA: Diagnosis present

## 2019-04-07 DIAGNOSIS — R112 Nausea with vomiting, unspecified: Secondary | ICD-10-CM

## 2019-04-07 DIAGNOSIS — Z7982 Long term (current) use of aspirin: Secondary | ICD-10-CM

## 2019-04-07 DIAGNOSIS — E119 Type 2 diabetes mellitus without complications: Secondary | ICD-10-CM | POA: Diagnosis present

## 2019-04-07 DIAGNOSIS — G9341 Metabolic encephalopathy: Secondary | ICD-10-CM | POA: Diagnosis present

## 2019-04-07 DIAGNOSIS — Z79891 Long term (current) use of opiate analgesic: Secondary | ICD-10-CM

## 2019-04-07 DIAGNOSIS — F17210 Nicotine dependence, cigarettes, uncomplicated: Secondary | ICD-10-CM | POA: Diagnosis present

## 2019-04-07 LAB — RAPID URINE DRUG SCREEN, HOSP PERFORMED
Amphetamines: NOT DETECTED
Barbiturates: NOT DETECTED
Benzodiazepines: NOT DETECTED
Cocaine: NOT DETECTED
Opiates: NOT DETECTED
Tetrahydrocannabinol: POSITIVE — AB

## 2019-04-07 LAB — CBC WITH DIFFERENTIAL/PLATELET
Abs Immature Granulocytes: 0.12 10*3/uL — ABNORMAL HIGH (ref 0.00–0.07)
Basophils Absolute: 0.1 10*3/uL (ref 0.0–0.1)
Basophils Relative: 0 %
Eosinophils Absolute: 0 10*3/uL (ref 0.0–0.5)
Eosinophils Relative: 0 %
HCT: 24.9 % — ABNORMAL LOW (ref 39.0–52.0)
Hemoglobin: 8.2 g/dL — ABNORMAL LOW (ref 13.0–17.0)
Immature Granulocytes: 1 %
Lymphocytes Relative: 8 %
Lymphs Abs: 1.3 10*3/uL (ref 0.7–4.0)
MCH: 29.6 pg (ref 26.0–34.0)
MCHC: 32.9 g/dL (ref 30.0–36.0)
MCV: 89.9 fL (ref 80.0–100.0)
Monocytes Absolute: 1.3 10*3/uL — ABNORMAL HIGH (ref 0.1–1.0)
Monocytes Relative: 8 %
Neutro Abs: 12.8 10*3/uL — ABNORMAL HIGH (ref 1.7–7.7)
Neutrophils Relative %: 83 %
Platelets: 484 10*3/uL — ABNORMAL HIGH (ref 150–400)
RBC: 2.77 MIL/uL — ABNORMAL LOW (ref 4.22–5.81)
RDW: 13.7 % (ref 11.5–15.5)
WBC: 15.6 10*3/uL — ABNORMAL HIGH (ref 4.0–10.5)
nRBC: 0 % (ref 0.0–0.2)

## 2019-04-07 LAB — COMPREHENSIVE METABOLIC PANEL
ALT: 73 U/L — ABNORMAL HIGH (ref 0–44)
AST: 102 U/L — ABNORMAL HIGH (ref 15–41)
Albumin: 2.7 g/dL — ABNORMAL LOW (ref 3.5–5.0)
Alkaline Phosphatase: 101 U/L (ref 38–126)
Anion gap: 14 (ref 5–15)
BUN: 13 mg/dL (ref 6–20)
CO2: 25 mmol/L (ref 22–32)
Calcium: 8.6 mg/dL — ABNORMAL LOW (ref 8.9–10.3)
Chloride: 92 mmol/L — ABNORMAL LOW (ref 98–111)
Creatinine, Ser: 0.84 mg/dL (ref 0.61–1.24)
GFR calc Af Amer: >60 mL/min (ref >60)
GFR calc non Af Amer: >60 mL/min (ref >60)
Glucose, Bld: 148 mg/dL — ABNORMAL HIGH (ref 70–99)
Potassium: 3.3 mmol/L — ABNORMAL LOW (ref 3.5–5.1)
Sodium: 131 mmol/L — ABNORMAL LOW (ref 135–145)
Total Bilirubin: 1.3 mg/dL — ABNORMAL HIGH (ref 0.3–1.2)
Total Protein: 7.1 g/dL (ref 6.5–8.1)

## 2019-04-07 LAB — MRSA PCR SCREENING: MRSA by PCR: NEGATIVE

## 2019-04-07 LAB — SARS CORONAVIRUS 2 BY RT PCR (HOSPITAL ORDER, PERFORMED IN ~~LOC~~ HOSPITAL LAB): SARS Coronavirus 2: NEGATIVE

## 2019-04-07 LAB — CBG MONITORING, ED: Glucose-Capillary: 131 mg/dL — ABNORMAL HIGH (ref 70–99)

## 2019-04-07 LAB — ETHANOL: Alcohol, Ethyl (B): <10 mg/dL (ref <10)

## 2019-04-07 LAB — MAGNESIUM: Magnesium: 1.5 mg/dL — ABNORMAL LOW (ref 1.7–2.4)

## 2019-04-07 LAB — PHOSPHORUS: Phosphorus: 3.9 mg/dL (ref 2.5–4.6)

## 2019-04-07 MED ORDER — LORAZEPAM 2 MG/ML IJ SOLN
2.0000 mg | INTRAMUSCULAR | Status: DC | PRN
  Administered 2019-04-07 (×2): 3 mg via INTRAVENOUS
  Filled 2019-04-07 (×2): qty 2

## 2019-04-07 MED ORDER — LORAZEPAM 2 MG/ML IJ SOLN: 0.0000 mg | Freq: Four times a day (QID) | INTRAMUSCULAR | Status: DC

## 2019-04-07 MED ORDER — SODIUM CHLORIDE 0.9 % IV BOLUS
1000.0000 mL | Freq: Once | INTRAVENOUS | Status: AC
  Administered 2019-04-07: 10:00:00 1000 mL via INTRAVENOUS

## 2019-04-07 MED ORDER — SODIUM CHLORIDE 0.9% FLUSH
3.0000 mL | Freq: Once | INTRAVENOUS | Status: AC
  Administered 2019-04-07: 3 mL via INTRAVENOUS

## 2019-04-07 MED ORDER — CHLORHEXIDINE GLUCONATE CLOTH 2 % EX PADS
6.0000 | MEDICATED_PAD | Freq: Every day | CUTANEOUS | Status: DC
  Administered 2019-04-07 – 2019-04-13 (×7): 6 via TOPICAL

## 2019-04-07 MED ORDER — VITAMIN B-1 100 MG PO TABS
100.0000 mg | ORAL_TABLET | Freq: Every day | ORAL | Status: DC
  Administered 2019-04-07: 09:00:00 100 mg via ORAL
  Filled 2019-04-07: qty 1

## 2019-04-07 MED ORDER — POTASSIUM CHLORIDE 10 MEQ/100ML IV SOLN
10.0000 meq | Freq: Once | INTRAVENOUS | Status: AC
  Administered 2019-04-07: 10:00:00 10 meq via INTRAVENOUS
  Filled 2019-04-07: qty 100

## 2019-04-07 MED ORDER — SODIUM CHLORIDE 0.9% FLUSH
3.0000 mL | Freq: Two times a day (BID) | INTRAVENOUS | Status: DC
  Administered 2019-04-07 – 2019-04-14 (×15): 3 mL via INTRAVENOUS

## 2019-04-07 MED ORDER — LORAZEPAM 1 MG PO TABS: 0.0000 mg | ORAL_TABLET | Freq: Two times a day (BID) | ORAL | Status: DC

## 2019-04-07 MED ORDER — THIAMINE HCL 100 MG/ML IJ SOLN: 100.0000 mg | Freq: Every day | INTRAMUSCULAR | Status: DC

## 2019-04-07 MED ORDER — ACETAMINOPHEN 325 MG PO TABS
650.0000 mg | ORAL_TABLET | Freq: Four times a day (QID) | ORAL | Status: DC | PRN
  Administered 2019-04-07 – 2019-04-15 (×7): 650 mg via ORAL
  Filled 2019-04-07 (×7): qty 2

## 2019-04-07 MED ORDER — LABETALOL HCL 5 MG/ML IV SOLN
10.0000 mg | INTRAVENOUS | Status: DC | PRN
  Administered 2019-04-14: 08:00:00 10 mg via INTRAVENOUS
  Filled 2019-04-07 (×3): qty 4

## 2019-04-07 MED ORDER — HALOPERIDOL LACTATE 5 MG/ML IJ SOLN
5.0000 mg | Freq: Once | INTRAMUSCULAR | Status: AC
  Administered 2019-04-07: 21:00:00 5 mg via INTRAMUSCULAR
  Filled 2019-04-07: qty 1

## 2019-04-07 MED ORDER — LORAZEPAM 2 MG/ML IJ SOLN
2.0000 mg | INTRAMUSCULAR | Status: DC | PRN
  Administered 2019-04-07: 20:00:00 2 mg via INTRAVENOUS
  Administered 2019-04-08: 22:00:00 1 mg via INTRAVENOUS
  Administered 2019-04-09 – 2019-04-14 (×2): 2 mg via INTRAVENOUS
  Filled 2019-04-07 (×6): qty 1

## 2019-04-07 MED ORDER — LORAZEPAM 2 MG/ML IJ SOLN: 0.0000 mg | Freq: Two times a day (BID) | INTRAMUSCULAR | Status: DC

## 2019-04-07 MED ORDER — DEXMEDETOMIDINE HCL IN NACL 200 MCG/50ML IV SOLN
0.4000 ug/kg/h | INTRAVENOUS | Status: DC
  Administered 2019-04-07: 0.4 ug/kg/h via INTRAVENOUS
  Administered 2019-04-07: 1.2 ug/kg/h via INTRAVENOUS
  Administered 2019-04-07: 0.6 ug/kg/h via INTRAVENOUS
  Administered 2019-04-08: 08:00:00 0.3 ug/kg/h via INTRAVENOUS
  Administered 2019-04-09: 14:00:00 0.4 ug/kg/h via INTRAVENOUS
  Filled 2019-04-07 (×4): qty 50
  Filled 2019-04-07: qty 100

## 2019-04-07 MED ORDER — LORAZEPAM 2 MG/ML IJ SOLN
1.0000 mg | Freq: Four times a day (QID) | INTRAMUSCULAR | Status: DC
  Administered 2019-04-08: 02:00:00 1 mg via INTRAVENOUS

## 2019-04-07 MED ORDER — ONDANSETRON HCL 4 MG/2ML IJ SOLN
4.0000 mg | Freq: Four times a day (QID) | INTRAMUSCULAR | Status: DC | PRN
  Administered 2019-04-12: 08:00:00 4 mg via INTRAVENOUS
  Filled 2019-04-07: qty 2

## 2019-04-07 MED ORDER — DIAZEPAM 5 MG PO TABS
5.0000 mg | ORAL_TABLET | Freq: Four times a day (QID) | ORAL | Status: DC
  Administered 2019-04-07 (×2): 5 mg via ORAL
  Filled 2019-04-07 (×2): qty 1

## 2019-04-07 MED ORDER — ONDANSETRON HCL 4 MG PO TABS: 4.0000 mg | ORAL_TABLET | Freq: Four times a day (QID) | ORAL | Status: DC | PRN

## 2019-04-07 MED ORDER — POTASSIUM CHLORIDE 10 MEQ/100ML IV SOLN
10.0000 meq | INTRAVENOUS | Status: AC
  Administered 2019-04-07 – 2019-04-08 (×4): 10 meq via INTRAVENOUS
  Filled 2019-04-07 (×4): qty 100

## 2019-04-07 MED ORDER — ACETAMINOPHEN 650 MG RE SUPP: 650.0000 mg | Freq: Four times a day (QID) | RECTAL | Status: DC | PRN

## 2019-04-07 MED ORDER — THIAMINE HCL 100 MG/ML IJ SOLN
Freq: Once | INTRAVENOUS | Status: AC
  Administered 2019-04-07: 14:00:00 via INTRAVENOUS
  Filled 2019-04-07: qty 1000

## 2019-04-07 MED ORDER — LISINOPRIL 10 MG PO TABS
10.0000 mg | ORAL_TABLET | Freq: Every day | ORAL | Status: DC
  Administered 2019-04-07: 10 mg via ORAL
  Filled 2019-04-07: qty 1

## 2019-04-07 MED ORDER — ENOXAPARIN SODIUM 40 MG/0.4ML ~~LOC~~ SOLN
40.0000 mg | SUBCUTANEOUS | Status: DC
  Administered 2019-04-07 – 2019-04-14 (×7): 40 mg via SUBCUTANEOUS
  Filled 2019-04-07 (×7): qty 0.4

## 2019-04-07 MED ORDER — DOCUSATE SODIUM 100 MG PO CAPS
100.0000 mg | ORAL_CAPSULE | Freq: Two times a day (BID) | ORAL | Status: DC
  Administered 2019-04-08 – 2019-04-15 (×12): 100 mg via ORAL
  Filled 2019-04-07 (×14): qty 1

## 2019-04-07 MED ORDER — LORAZEPAM 1 MG PO TABS
0.0000 mg | ORAL_TABLET | Freq: Four times a day (QID) | ORAL | Status: DC
  Administered 2019-04-07: 1 mg via ORAL
  Filled 2019-04-07: qty 1

## 2019-04-07 NOTE — ED Notes (Signed)
ED TO INPATIENT HANDOFF REPORT  ED Nurse Name and Phone #: Alycia RossettiRyan, [phone]  S Name/Age/Gender Richard Lopez 43 y.o. male Room/Bed: 018C/018C  Code Status   Code Status: Full Code  Home/SNF/Other Home Patient oriented to: self and time Is this baseline? No   Triage Complete: Triage complete  Chief Complaint ams  Triage Note Pt BIB [facility] from home. Per family, pt has been having visual hallucinations this AM. Pt had hip surgery on May 30th. Is prescribed oxycodone but has never had these symptoms. Per EMS, pt a&o x4. VSS.   Allergies No Known Allergies  Level of Care/Admitting Diagnosis ED Disposition    ED Disposition Condition Comment   Admit  [facility]ospital [facility] [[id number]]  Level of Care: Progressive [102]  I expect the patient will be discharged within 24 hours: No (not a candidate for 5C-Observation unit)  Covid Evaluation: Screening Protocol (No Symptoms)  Diagnosis: Delirium tremens [facility](HCC) [[id number]]) [[id number]]  Admitting Physician: Jonah BlueYATES, JENNIFER [[id number]]  Attending Physician: Jonah BlueYATES, JENNIFER [[id number]]  PT Class (Do Not Modify): Observation [104]  PT Acc Code (Do Not Modify): Observation [10022]       B Medical/Surgery History Past Medical History:  Diagnosis Date  . Anxiety   . 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 Depression   . Diabetes mellitus    diet controlled  . Essential hypertension   . Gastritis   . Gout    Past Surgical History:  Procedure Laterality Date  . EXTERNAL FIXATION LEG Left 11/07/2018   Procedure: EXTERNAL FIXATION LEFT LOWER LEG;  Surgeon: Samson FredericSwinteck, Brian, MD;  Location: [facility];  Service: Orthopedics;  Laterality: Left;  . EXTERNAL FIXATION REMOVAL Left 11/08/2018   Procedure: REMOVAL EXTERNAL FIXATION LEG;  Surgeon: Roby LoftsHaddix, Kevin P, MD;  Location: [facility] OR;  Service: Orthopedics;  Laterality: Left;  . INTRAMEDULLARY (IM) NAIL INTERTROCHANTERIC Right 03/26/2019   Procedure: INTRAMEDULLARY (IM) NAIL INTERTROCHANTRIC;   Surgeon: Roby LoftsHaddix, Kevin P, MD;  Location: [facility] OR;  Service: Orthopedics;  Laterality: Right;  . NO PAST SURGERIES    . OPEN REDUCTION INTERNAL FIXATION (ORIF) TIBIA/FIBULA FRACTURE Left 11/08/2018   Procedure: OPEN REDUCTION INTERNAL FIXATION (ORIF) TIBIA/FIBULA FRACTURE;  Surgeon: Roby LoftsHaddix, Kevin P, MD;  Location: [facility] OR;  Service: Orthopedics;  Laterality: Left;     A IV Location/Drains/Wounds Patient Lines/Drains/Airways Status   Active Line/Drains/Airways    Name:   Placement date:   Placement time:   Site:   Days:   Peripheral IV 04/07/19 Right Forearm   04/07/19    0839    Forearm   less than 1   Incision (Closed) 11/08/18 Leg Left   11/08/18    1418     150   Incision (Closed) 03/26/19 Hip Right   03/26/19    1141     12          Intake/Output Last 24 hours  Intake/Output Summary (Last 24 hours) at 04/07/2019 1159 Last data filed at 04/07/2019 1128 Gross per 24 hour  Intake 100 ml  Output -  Net 100 ml    Labs/Imaging Results for orders placed or performed during the hospital encounter of 04/07/19 (from the past 48 hour(s))  CBG monitoring, ED     Status: Abnormal   Collection Time: 04/07/19  8:07 AM  Result Value Ref Range   Glucose-Capillary 131 (H) 70 - 99 mg/dL  Ethanol     Status: None   Collection Time: 04/07/19  8:36 AM  Result Value Ref Range   Alcohol, Ethyl (B) <10 <10 mg/dL    Comment: (NOTE) Lowest detectable limit for serum alcohol is 10 mg/dL. For medical purposes only. Performed at [facility], [address] [place], [place] [address]   Comprehensive metabolic panel     Status: Abnormal   Collection Time: 04/07/19  8:36 AM  Result Value Ref Range   Sodium 131 (L) 135 - 145 mmol/L   Potassium 3.3 (L) 3.5 - 5.1 mmol/L   Chloride 92 (L) 98 - 111 mmol/L   CO2 25 22 - 32 mmol/L   Glucose, Bld 148 (H) 70 - 99 mg/dL   BUN 13 6 - 20 mg/dL   Creatinine, Ser 6.040.84 0.61 - 1.24 mg/dL   Calcium 8.6 (L) 8.9 - 10.3 mg/dL   Total Protein 7.1 6.5 - 8.1 g/dL    Albumin 2.7 (L) 3.5 - 5.0 g/dL   AST 540102 (H) 15 - 41 U/L   ALT 73 (H) 0 - 44 U/L   Alkaline Phosphatase 101 38 - 126 U/L   Total Bilirubin 1.3 (H) 0.3 - 1.2 mg/dL   GFR calc non Af Amer >60 >60 mL/min   GFR calc Af Amer >60 >60 mL/min   Anion gap 14 5 - 15    Comment: Performed at [facility], [address] [place], [place] [address]  Magnesium     Status: Abnormal   Collection Time: 04/07/19  8:36 AM  Result Value Ref Range   Magnesium 1.5 (L) 1.7 - 2.4 mg/dL    Comment: Performed at [facility], [address] [place], [place] [address]  Phosphorus     Status: None   Collection Time: 04/07/19  8:36 AM  Result Value Ref Range   Phosphorus 3.9 2.5 - 4.6 mg/dL    Comment: Performed at [facility], [address] [place], [place] [address]  CBC with Differential     Status: Abnormal   Collection Time: 04/07/19  8:40 AM  Result Value Ref Range   WBC 15.6 (H) 4.0 - 10.5 K/uL   RBC 2.77 (L) 4.22 - 5.81 MIL/uL   Hemoglobin 8.2 (L) 13.0 - 17.0 g/dL   HCT 30.824.9 (L) 65.739.0 - 84.652.0 %   MCV 89.9 80.0 - 100.0 fL   MCH 29.6 26.0 - 34.0 pg   MCHC 32.9 30.0 - 36.0 g/dL   RDW 96.213.7 95.211.5 - 84.115.5 %   Platelets 484 (H) 150 - 400 K/uL   nRBC 0.0 0.0 - 0.2 %   Neutrophils Relative % 83 %   Neutro Abs 12.8 (H) 1.7 - 7.7 K/uL   Lymphocytes Relative 8 %   Lymphs Abs 1.3 0.7 - 4.0 K/uL   Monocytes Relative 8 %   Monocytes Absolute 1.3 (H) 0.1 - 1.0 K/uL   Eosinophils Relative 0 %   Eosinophils Absolute 0.0 0.0 - 0.5 K/uL   Basophils Relative 0 %   Basophils Absolute 0.1 0.0 - 0.1 K/uL   Immature Granulocytes 1 %   Abs Immature Granulocytes 0.12 (H) 0.00 - 0.07 K/uL    Comment: Performed at [facility], [address] [place], [place] [address]  Rapid urine drug screen (hospital performed)     Status: Abnormal   Collection Time: 04/07/19  8:41 AM  Result Value Ref Range   Opiates NONE DETECTED NONE DETECTED   Cocaine NONE DETECTED NONE DETECTED   Benzodiazepines  NONE DETECTED NONE DETECTED  Amphetamines NONE DETECTED NONE DETECTED   Tetrahydrocannabinol POSITIVE (A) NONE DETECTED   Barbiturates NONE DETECTED NONE DETECTED    Comment: (NOTE) DRUG SCREEN FOR MEDICAL PURPOSES ONLY.  IF CONFIRMATION IS NEEDED FOR ANY PURPOSE, NOTIFY LAB WITHIN 5 DAYS. LOWEST DETECTABLE LIMITS FOR URINE DRUG SCREEN Drug Class                     Cutoff (ng/mL) Amphetamine and metabolites    1000 Barbiturate and metabolites    200 Benzodiazepine                 200 Tricyclics and metabolites     300 Opiates and metabolites        300 Cocaine and metabolites        300 THC                            50 Performed at [facility], [address], [place], [place] [address]   SARS Coronavirus 2 (CEPHEID - Performed in [facility] hospital lab), Hosp Order     Status: None   Collection Time: 04/07/19 10:18 AM  Result Value Ref Range   SARS Coronavirus 2 NEGATIVE NEGATIVE    Comment: (NOTE) If result is NEGATIVE SARS-CoV-2 target nucleic acids are NOT DETECTED. The SARS-CoV-2 RNA is generally detectable in upper and lower  respiratory specimens during the acute phase of infection. The lowest  concentration of SARS-CoV-2 viral copies this assay can detect is 250  copies / mL. A negative result does not preclude SARS-CoV-2 infection  and should not be used as the sole basis for treatment or other  patient management decisions.  A negative result may occur with  improper specimen collection / handling, submission of specimen other  than nasopharyngeal swab, presence of viral mutation(s) within the  areas targeted by this assay, and inadequate number of viral copies  (<250 copies / mL). A negative result must be combined with clinical  observations, patient history, and epidemiological information. If result is POSITIVE SARS-CoV-2 target nucleic acids are DETECTED. The SARS-CoV-2 RNA is generally detectable in upper and lower  respiratory specimens dur ing  the acute phase of infection.  Positive  results are indicative of active infection with SARS-CoV-2.  Clinical  correlation with patient history and other diagnostic information is  necessary to determine patient infection status.  Positive results do  not rule out bacterial infection or co-infection with other viruses. If result is PRESUMPTIVE POSTIVE SARS-CoV-2 nucleic acids MAY BE PRESENT.   A presumptive positive result was obtained on the submitted specimen  and confirmed on repeat testing.  While 2019 novel coronavirus  (SARS-CoV-2) nucleic acids may be present in the submitted sample  additional confirmatory testing may be necessary for epidemiological  and / or clinical management purposes  to differentiate between  SARS-CoV-2 and other Sarbecovirus currently known to infect humans.  If clinically indicated additional testing with an alternate test  methodology [phone]) is advised. The SARS-CoV-2 RNA is generally  detectable in upper and lower respiratory sp ecimens during the acute  phase of infection. The expected result is Negative. Fact Sheet for Patients:  BoilerBrush.com.cy Fact Sheet for Healthcare Providers: https://pope.com/ This test is not yet approved or cleared by the [place] FDA and has been authorized for detection and/or diagnosis of SARS-CoV-2 by FDA under an Emergency Use Authorization (EUA).  This EUA will remain in effect (meaning this  test can be used) for the duration of the COVID-19 declaration under Section 564(b)(1) of the Act, 21 U.S.C. section 360bbb-3(b)(1), unless the authorization is terminated or revoked sooner. Performed at [facility], [address], [place], [place] [address]    Ct Head Wo Contrast  Result Date: 04/07/2019 CLINICAL DATA:  Visual hallucinations. EXAM: CT HEAD WITHOUT CONTRAST TECHNIQUE: Contiguous axial images were obtained from the base of the skull through the  vertex without intravenous contrast. COMPARISON:  Mar 25, 2019 FINDINGS: Brain: Mild diffuse atrophy for age is stable. There is no intracranial mass, hemorrhage, extra-axial fluid collection, or midline shift. Brain parenchyma appears unremarkable. No acute infarct is demonstrable. Vascular: There is no appreciable hyperdense vessel. There is no appreciable vascular calcification. Skull: Bony calvarium appears intact. Sinuses/Orbits: There is mild mucosal thickening in several ethmoid air cells. There is a small retention cyst in the anterior left ethmoid air cell. 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 Orbits appear symmetric bilaterally. Other: Visualized mastoid air cells are clear. IMPRESSION: Stable atrophy for age. Brain parenchyma appears unremarkable. No acute infarct. No mass or hemorrhage. Mild paranasal sinus disease. Electronically Signed   By: Bretta Bang III M.D.   On: 04/07/2019 09:08    Pending Labs Unresulted Labs (From admission, onward)    Start     Ordered   04/08/19 0500  Basic metabolic panel  Tomorrow morning,   R     04/07/19 1152   04/08/19 0500  CBC  Tomorrow morning,   R     04/07/19 1152          Vitals/Pain Today's Vitals   04/07/19 0804 04/07/19 0805 04/07/19 0841 04/07/19 1045  BP:   (!) 155/80 133/87  Pulse:   (!) 106   Resp:      Temp:      TempSrc:      SpO2:      Weight:  101 kg    Height:  6\' 6"  (1.981 m)    PainSc: 7        Isolation Precautions No active isolations  Medications Medications  lisinopril (ZESTRIL) tablet 10 mg (has no administration in time range)  enoxaparin (LOVENOX) injection 40 mg (has no administration in time range)  acetaminophen (TYLENOL) tablet 650 mg (has no administration in time range)    Or  acetaminophen (TYLENOL) suppository 650 mg (has no administration in time range)  docusate sodium (COLACE) capsule 100 mg (has no administration in time range)  ondansetron (ZOFRAN) tablet  4 mg (has no administration in time range)    Or  ondansetron (ZOFRAN) injection 4 mg (has no administration in time range)  sodium chloride 0.9 % 1,000 mL with thiamine 100 mg, folic acid 1 mg, multivitamins adult 10 mL infusion (has no administration in time range)  LORazepam (ATIVAN) injection 2-3 mg (has no administration in time range)  sodium chloride flush (NS) 0.9 % injection 3 mL (has no administration in time range)  diazepam (VALIUM) tablet 5 mg (has no administration in time range)  sodium chloride flush (NS) 0.9 % injection 3 mL (3 mLs Intravenous Given 04/07/19 0849)  sodium chloride 0.9 % bolus 1,000 mL (1,000 mLs Intravenous New Bag/Given 04/07/19 1028)  potassium chloride 10 mEq in 100 mL IVPB (0 mEq Intravenous Stopped 04/07/19 1128)    Mobility walks High fall risk   Focused Assessments     R Recommendations: See Admitting Provider Note  Report given to:  Additional Notes:

## 2019-04-07 NOTE — ED Notes (Signed)
Patient transported to CT 

## 2019-04-07 NOTE — Progress Notes (Addendum)
I received a call from the nurse on 5W with concerns about the patient's right hip (s/p recent hip fracture repair).  At the time of my evaluation, the patient was moving around in the bed and showed me his abdomen but I did not see the hip itself since he had no complaints about this.  According to the nurse, she still is not complaining about it.  However, she notes that it is "red" - although the surgical incision is not red and appears C/D/I.  I have requested that she take a picture for the chart so that I can more effectively assess this issue.  She is concerned that the hip may be dislocated, despite his apparent lack of discomfort.  Will assess further.  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  I went to see the patient.  He is clearly delirious and hallucinating.  He appears likely to need Precedex again, and this is his most pressing issue.  He has recently been given IV Ativan and is due for PO Valium.  If this is ineffective, the RN will call back and PCCM will need to be notified.  Meanwhile, I evaluated his right hip.  I attempted to take a picture, but this was apparently ineffective.  All 3 hip incisions are C/D/I and I do not see significant erythema.  There does appear to be a seroma under the lateral middle incision.  While this could be infectious, there is currently no drainage to indicate that this is the case and it is not currently erythematous.  Will monitor for now.   Addendum at 1830 - I was called back by the nurse and the patient's withdrawal is not controlled with current medications.  He appears very likely to require Precedex once again to help him get through DTs.  I have spoken with the PCCM team and they will see the patient - probably after 7pm.  They understand that he is likely to require ICU transfer.    Georgana Curio, M.D

## 2019-04-07 NOTE — Significant Event (Signed)
Rapid Response Event Note  Overview: Time Called: 1745 Arrival Time: 1745 Event Type: Neurologic, Other (Comment)(CIWA)  Initial Focused Assessment: Patient admitted from the ED had received 1mg  ativan, 5 mg valium and thiamin PO while in the ED.  Per RN he arrived on the unit alert and oriented,  CIWA 13.  Increasing disorientation now not engaging with staff, visual and audible hallucination.  CIWA now 27 after 6mg  Ativan IV and 5 mg Valium PO. CCM consulted.  Plan move to ICU for precedex gtt  Interventions:  Plan of Care (if not transferred):  Event Summary: Name of Physician Notified: Ophelia Charter at 1730  Name of Consulting Physician Notified: Yacoub at 1830  Outcome: Transferred (Comment)  Event End Time: 1910  Marcellina Millin

## 2019-04-07 NOTE — ED Triage Notes (Signed)
Pt BIB [facility] from home. Per family, pt has been having visual hallucinations this AM. Pt had hip surgery on May 30th. Is prescribed oxycodone but has never had these symptoms. Per EMS, pt a&o x4. VSS.

## 2019-04-07 NOTE — Progress Notes (Signed)
Patient arrived on 4N ICU at 77 with some clothing and dentures, and 1:1 safety sitter. Patient was disoriented x4 and experiencing visual and tactile hallucinations on arrival, and became verbally and physically combative with staff. Patient given 2mg  Ativan PRN with no reduction in agitation, CCM MD notified and patient was then given 5mg  Haldol IM per order. Patient still verbally and physically aggressive. Patient was subsequently placed in 5 point soft restraints for patient and staff safety. Will continue to monitor patient and evaluate need for further intervention.  Aris Lot, RN
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
## 2019-04-07 NOTE — ED Notes (Signed)
Selena Batten (friend) [phone]
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
## 2019-04-08 DIAGNOSIS — R945 Abnormal results of liver function studies: Secondary | ICD-10-CM

## 2019-04-08 DIAGNOSIS — R7303 Prediabetes: Secondary | ICD-10-CM

## 2019-04-08 DIAGNOSIS — E876 Hypokalemia: Secondary | ICD-10-CM

## 2019-04-08 LAB — CBC
HCT: 23.6 % — ABNORMAL LOW (ref 39.0–52.0)
Hemoglobin: 7.9 g/dL — ABNORMAL LOW (ref 13.0–17.0)
MCH: 30.2 pg (ref 26.0–34.0)
MCHC: 33.5 g/dL (ref 30.0–36.0)
MCV: 90.1 fL (ref 80.0–100.0)
Platelets: 446 10*3/uL — ABNORMAL HIGH (ref 150–400)
RBC: 2.62 MIL/uL — ABNORMAL LOW (ref 4.22–5.81)
RDW: 13.7 % (ref 11.5–15.5)
WBC: 11.2 10*3/uL — ABNORMAL HIGH (ref 4.0–10.5)
nRBC: 0 % (ref 0.0–0.2)

## 2019-04-08 LAB — BASIC METABOLIC PANEL
Anion gap: 10 (ref 5–15)
BUN: 11 mg/dL (ref 6–20)
CO2: 23 mmol/L (ref 22–32)
Calcium: 8.7 mg/dL — ABNORMAL LOW (ref 8.9–10.3)
Chloride: 104 mmol/L (ref 98–111)
Creatinine, Ser: 0.72 mg/dL (ref 0.61–1.24)
GFR calc Af Amer: >60 mL/min (ref >60)
GFR calc non Af Amer: >60 mL/min (ref >60)
Glucose, Bld: 130 mg/dL — ABNORMAL HIGH (ref 70–99)
Potassium: 4.2 mmol/L (ref 3.5–5.1)
Sodium: 137 mmol/L (ref 135–145)

## 2019-04-08 LAB — MAGNESIUM: Magnesium: 1.7 mg/dL (ref 1.7–2.4)

## 2019-04-08 LAB — GLUCOSE, CAPILLARY
Glucose-Capillary: 120 mg/dL — ABNORMAL HIGH (ref 70–99)
Glucose-Capillary: 159 mg/dL — ABNORMAL HIGH (ref 70–99)

## 2019-04-08 LAB — PHOSPHORUS: Phosphorus: 3.4 mg/dL (ref 2.5–4.6)

## 2019-04-08 MED ORDER — FOLIC ACID 1 MG PO TABS
1.0000 mg | ORAL_TABLET | Freq: Every day | ORAL | Status: DC
  Administered 2019-04-08 – 2019-04-15 (×7): 1 mg via ORAL
  Filled 2019-04-08 (×8): qty 1

## 2019-04-08 MED ORDER — DIAZEPAM 5 MG PO TABS
5.0000 mg | ORAL_TABLET | Freq: Two times a day (BID) | ORAL | Status: DC
  Administered 2019-04-08 – 2019-04-13 (×10): 5 mg via ORAL
  Filled 2019-04-08 (×11): qty 1

## 2019-04-08 MED ORDER — LORAZEPAM 2 MG/ML IJ SOLN
0.0000 mg | Freq: Two times a day (BID) | INTRAMUSCULAR | Status: AC
  Filled 2019-04-08: qty 1

## 2019-04-08 MED ORDER — ADULT MULTIVITAMIN W/MINERALS CH
1.0000 | ORAL_TABLET | Freq: Every day | ORAL | Status: DC
  Administered 2019-04-08 – 2019-04-15 (×7): 1 via ORAL
  Filled 2019-04-08 (×8): qty 1

## 2019-04-08 MED ORDER — VITAMIN B-1 100 MG PO TABS
100.0000 mg | ORAL_TABLET | Freq: Every day | ORAL | Status: DC
  Administered 2019-04-08 – 2019-04-15 (×7): 100 mg via ORAL
  Filled 2019-04-08 (×8): qty 1

## 2019-04-08 MED ORDER — MAGNESIUM OXIDE 400 (241.3 MG) MG PO TABS
400.0000 mg | ORAL_TABLET | Freq: Two times a day (BID) | ORAL | Status: DC
  Administered 2019-04-08 – 2019-04-15 (×13): 400 mg via ORAL
  Filled 2019-04-08 (×14): qty 1

## 2019-04-08 MED ORDER — LORAZEPAM 2 MG/ML IJ SOLN
0.0000 mg | Freq: Four times a day (QID) | INTRAMUSCULAR | Status: AC
  Administered 2019-04-09 (×2): 2 mg via INTRAVENOUS
  Filled 2019-04-08: qty 1
  Filled 2019-04-08: qty 2

## 2019-04-08 MED ORDER — LORAZEPAM 1 MG PO TABS
1.0000 mg | ORAL_TABLET | Freq: Four times a day (QID) | ORAL | Status: AC | PRN
  Administered 2019-04-11: 01:00:00 1 mg via ORAL
  Filled 2019-04-08: qty 1

## 2019-04-08 MED ORDER — INDOMETHACIN 25 MG PO CAPS
25.0000 mg | ORAL_CAPSULE | Freq: Three times a day (TID) | ORAL | Status: DC | PRN
  Administered 2019-04-08 – 2019-04-10 (×3): 25 mg via ORAL
  Filled 2019-04-08 (×5): qty 1

## 2019-04-08 MED ORDER — THIAMINE HCL 100 MG/ML IJ SOLN: 100.0000 mg | Freq: Every day | INTRAMUSCULAR | Status: DC

## 2019-04-08 MED ORDER — LORAZEPAM 2 MG/ML IJ SOLN
1.0000 mg | Freq: Four times a day (QID) | INTRAMUSCULAR | Status: AC | PRN
  Administered 2019-04-08 – 2019-04-09 (×2): 1 mg via INTRAVENOUS
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
## 2019-04-09 ENCOUNTER — Inpatient Hospital Stay (HOSPITAL_COMMUNITY): Payer: Self-pay

## 2019-04-09 DIAGNOSIS — Z8781 Personal history of (healed) traumatic fracture: Secondary | ICD-10-CM

## 2019-04-09 DIAGNOSIS — Z9889 Other specified postprocedural states: Secondary | ICD-10-CM

## 2019-04-09 LAB — COMPREHENSIVE METABOLIC PANEL
ALT: 54 U/L — ABNORMAL HIGH (ref 0–44)
AST: 60 U/L — ABNORMAL HIGH (ref 15–41)
Albumin: 2.5 g/dL — ABNORMAL LOW (ref 3.5–5.0)
Alkaline Phosphatase: 88 U/L (ref 38–126)
Anion gap: 11 (ref 5–15)
BUN: 12 mg/dL (ref 6–20)
CO2: 24 mmol/L (ref 22–32)
Calcium: 8.7 mg/dL — ABNORMAL LOW (ref 8.9–10.3)
Chloride: 103 mmol/L (ref 98–111)
Creatinine, Ser: 0.68 mg/dL (ref 0.61–1.24)
GFR calc Af Amer: >60 mL/min (ref >60)
GFR calc non Af Amer: >60 mL/min (ref >60)
Glucose, Bld: 136 mg/dL — ABNORMAL HIGH (ref 70–99)
Potassium: 3.3 mmol/L — ABNORMAL LOW (ref 3.5–5.1)
Sodium: 138 mmol/L (ref 135–145)
Total Bilirubin: 0.7 mg/dL (ref 0.3–1.2)
Total Protein: 6.6 g/dL (ref 6.5–8.1)

## 2019-04-09 LAB — CBC
HCT: 21.9 % — ABNORMAL LOW (ref 39.0–52.0)
Hemoglobin: 7.4 g/dL — ABNORMAL LOW (ref 13.0–17.0)
MCH: 30.1 pg (ref 26.0–34.0)
MCHC: 33.8 g/dL (ref 30.0–36.0)
MCV: 89 fL (ref 80.0–100.0)
Platelets: 459 10*3/uL — ABNORMAL HIGH (ref 150–400)
RBC: 2.46 MIL/uL — ABNORMAL LOW (ref 4.22–5.81)
RDW: 13.8 % (ref 11.5–15.5)
WBC: 10.1 10*3/uL (ref 4.0–10.5)
nRBC: 0 % (ref 0.0–0.2)

## 2019-04-09 LAB — GLUCOSE, CAPILLARY
Glucose-Capillary: 129 mg/dL — ABNORMAL HIGH (ref 70–99)
Glucose-Capillary: 124 mg/dL — ABNORMAL HIGH (ref 70–99)
Glucose-Capillary: 126 mg/dL — ABNORMAL HIGH (ref 70–99)
Glucose-Capillary: 120 mg/dL — ABNORMAL HIGH (ref 70–99)

## 2019-04-09 LAB — MAGNESIUM: Magnesium: 1.5 mg/dL — ABNORMAL LOW (ref 1.7–2.4)

## 2019-04-09 LAB — PHOSPHORUS: Phosphorus: 3.2 mg/dL (ref 2.5–4.6)

## 2019-04-09 MED ORDER — DEXMEDETOMIDINE HCL IN NACL 400 MCG/100ML IV SOLN
0.4000 ug/kg/h | INTRAVENOUS | Status: DC
  Administered 2019-04-09: 21:00:00 0.4 ug/kg/h via INTRAVENOUS
  Administered 2019-04-10: 0.3 ug/kg/h via INTRAVENOUS
  Filled 2019-04-09 (×2): qty 100

## 2019-04-09 MED ORDER — MAGNESIUM SULFATE 2 GM/50ML IV SOLN
2.0000 g | Freq: Once | INTRAVENOUS | Status: AC
  Administered 2019-04-09: 10:00:00 2 g via INTRAVENOUS
  Filled 2019-04-09: qty 50

## 2019-04-09 MED ORDER — POTASSIUM CHLORIDE CRYS ER 20 MEQ PO TBCR
40.0000 meq | EXTENDED_RELEASE_TABLET | Freq: Two times a day (BID) | ORAL | Status: AC
  Administered 2019-04-09 (×2): 40 meq via ORAL
  Filled 2019-04-09 (×2): qty 2

## 2019-04-09 NOTE — Progress Notes (Signed)
PROGRESS NOTE    Richard Lopez  KXF:[id number] DOB: Mar 20, 1976 DOA: 04/07/2019 PCP: Patient, No Pcp Per   Brief Narrative:  Richard Lopez is a 43 y.o. male with medical history significant of HTN, DM, ETOH dependence and  gout presenting with AMS. He was hospitalized from 5/22 till 5/29 for right hip fracture. He was admitted for Delirium tremens and overnight patient became more agitated required precedex gtt and transfer to ICU.   Assessment & Plan:   Principal Problem:   DTs (delirium tremens) (HCC) Active Problems:   Essential hypertension   Prediabetes   Encephalopathy acute   Delirium tremens (HCC)   Acute metabolic encephalopathy from  Alcohol withdrawal : Continue to watch him on CIWA, and appreciate PCCM recommendations.  Plan to wean him off the precedex gtt as appropriate.  He was off precedex for 12 hours but this afternoon, pt became agitated and he was back on it.  Continue to monitor in ICU.   Leukocytosis:  ? Reactive.  Normalized.  CXR no active disease. UA will be ordered.     Essential hypertension:  Well controlled.     Anemia of chronic disease:  Hemoglobin around 7.9.  Transfuse to keep hemoglobin greater than 7.  Anemia panel shows low iron levels.  Iron supplementation will be added.      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  Improving.   Diet controlled DM    Hypokalemia and hypomagnesemia:  Replaced. Repeat in am.     DVT prophylaxis: Lovenox.  Code Status: full code  Family Communication: none at bedside.  Disposition Plan: pending clinical improvement.    Consultants:   PCCM for precedex gtt.   Procedures:  None.  Antimicrobials: none.   Subjective: Somnolent.   Objective: Vitals:   04/09/19 1300 04/09/19 1400 04/09/19 1500 04/09/19 1600  BP:  (!) 125/59 112/63 130/81  Pulse: 90 73 64 68  Resp: 19  (!) 25 (!) 30  Temp:    (!) 97.2 F (36.2 C)  TempSrc:    Axillary  SpO2: 100% 100% 100% 99%   Weight:      Height:        Intake/Output Summary (Last 24 hours) at 04/09/2019 1659 Last data filed at 04/09/2019 1600 Gross per 24 hour  Intake 444.84 ml  Output 725 ml  Net -280.16 ml   Filed Weights   04/07/19 0805 04/07/19 2000  Weight: 101 kg 104.5 kg    Examination:  General exam: sedated from precedex.  Respiratory system: diminished at bases. No wheezing.  Cardiovascular system: S1 & S2 heard, RRR. No JVD,  Gastrointestinal system: Abdomen is soft BS+ Central nervous system: Sedated from precedex.  Extremities: no pedal edema.  Skin: No rashes, lesions or ulcers Psychiatry: cannot be assessed.     Data Reviewed: I have personally reviewed following labs and imaging studies  CBC: Recent Labs  Lab 04/07/19 0840 04/08/19 0252 04/09/19 0148  WBC 15.6* 11.2* 10.1  NEUTROABS 12.8*  --   --   HGB 8.2* 7.9* 7.4*  HCT 24.9* 23.6* 21.9*  MCV 89.9 90.1 89.0  PLT 484* 446* 459*   Basic Metabolic Panel: Recent Labs  Lab 04/07/19 0836 04/08/19 0252 04/09/19 0148  NA 131* 137 138  K 3.3* 4.2 3.3*  CL 92* 104 103  CO2 25 23 24   GLUCOSE 148* 130* 136*  BUN 13 11 12   CREATININE 0.84 0.72 0.68  CALCIUM 8.6* 8.7* 8.7*  MG 1.5* 1.7 1.5*  PHOS 3.9 3.4 3.2   GFR: Estimated Creatinine Clearance: 157.1 mL/min (by C-G formula based on SCr of 0.68 mg/dL). Liver Function Tests: Recent Labs  Lab 04/07/19 0836 04/09/19 0148  AST 102* 60*  ALT 73* 54*  ALKPHOS 101 88  BILITOT 1.3* 0.7  PROT 7.1 6.6  ALBUMIN 2.7* 2.5*   No results for input(s): LIPASE, AMYLASE in the last 168 hours. No results for input(s): AMMONIA in the last 168 hours. Coagulation Profile: No results for input(s): INR, PROTIME in the last 168 hours. Cardiac Enzymes: No results for input(s): CKTOTAL, CKMB, CKMBINDEX, TROPONINI in the last 168 hours. BNP (last 3 results) No results for input(s): PROBNP in the last 8760 hours. HbA1C: No results for input(s): HGBA1C in the last 72 hours. CBG:  Recent Labs  Lab 04/08/19 0820 04/08/19 2119 04/09/19 0728 04/09/19 1121 04/09/19 1624  GLUCAP 120* 159* 129* 124* 126*   Lipid Profile: No results for input(s): CHOL, HDL, LDLCALC, TRIG, CHOLHDL, LDLDIRECT in the last 72 hours. Thyroid Function Tests: No results for input(s): TSH, T4TOTAL, FREET4, T3FREE, THYROIDAB in the last 72 hours. Anemia Panel: No results for input(s): VITAMINB12, FOLATE, FERRITIN, TIBC, IRON, RETICCTPCT in the last 72 hours. Sepsis Labs: No results for input(s): PROCALCITON, LATICACIDVEN in the last 168 hours.  Recent Results (from the past 240 hour(s))  SARS Coronavirus 2 (CEPHEID - Performed in [place] hospital lab), Hosp Order     Status: None   Collection Time: 04/07/19 10:18 AM  Result Value Ref Range Status   SARS Coronavirus 2 NEGATIVE NEGATIVE Final    Comment: (NOTE) If result is NEGATIVE SARS-CoV-2 target nucleic acids are NOT DETECTED. The SARS-CoV-2 RNA is generally detectable in upper and lower  respiratory specimens during the acute phase of infection. The lowest  concentration of SARS-CoV-2 viral copies this assay can detect is 250  copies / mL. A negative result does not preclude SARS-CoV-2 infection  and should not be used as the sole basis for treatment or other  patient management decisions.  A negative result may occur with  improper specimen collection / handling, submission of specimen other  than nasopharyngeal swab, presence of viral mutation(s) within the  areas targeted by this assay, and inadequate number of viral copies  (<250 copies / mL). A negative result must be combined with clinical  observations, patient history, and epidemiological information. If result is POSITIVE SARS-CoV-2 target nucleic acids are DETECTED. The SARS-CoV-2 RNA is generally detectable in upper and lower  respiratory specimens dur ing the acute phase of infection.  Positive  results are indicative of active infection with SARS-CoV-2.   Clinical  correlation with patient history and other diagnostic information is  necessary to determine patient infection status.  Positive results do  not rule out bacterial infection or co-infection with other viruses. If result is PRESUMPTIVE POSTIVE SARS-CoV-2 nucleic acids MAY BE PRESENT.   A presumptive positive result was obtained on the submitted specimen  and confirmed on repeat testing.  While 2019 novel coronavirus  (SARS-CoV-2) nucleic acids may be present in the submitted sample  additional confirmatory testing may be necessary for epidemiological  and / or clinical management purposes  to differentiate between  SARS-CoV-2 and other Sarbecovirus currently known to infect humans.  If clinically indicated additional testing with an alternate test  methodology [phone]) is advised. The SARS-CoV-2 RNA is generally  detectable in upper and lower respiratory sp ecimens during the acute  phase of infection. The expected result is Negative.  Fact Sheet for Patients:  BoilerBrush.com.cy Fact Sheet for Healthcare Providers: https://pope.com/ This test is not yet approved or cleared by the [place] FDA and has been authorized for detection and/or diagnosis of SARS-CoV-2 by FDA under an Emergency Use Authorization (EUA).  This EUA will remain in effect (meaning this test can be used) for the duration of the COVID-19 declaration under Section 564(b)(1) of the Act, 21 U.S.C. section 360bbb-3(b)(1), unless the authorization is terminated or revoked sooner. Performed at [facility], [address], [place], [place] [address]   MRSA PCR Screening     Status: None   Collection Time: 04/07/19  4:29 PM  Result Value Ref Range Status   MRSA by PCR NEGATIVE NEGATIVE Final    Comment:        The GeneXpert MRSA Assay (FDA approved for NASAL specimens only), is one component of a comprehensive MRSA colonization surveillance program.  It is not intended to diagnose MRSA infection nor to guide or monitor treatment for MRSA infections. Performed at [facility], [address] [place], [place] [address]          Radiology Studies: Dg Chest Port 1 View  Result Date: 04/09/2019 CLINICAL DATA:  Respiratory failure.  Hypertension. EXAM: PORTABLE CHEST 1 VIEW COMPARISON:  August 24, 2010 FINDINGS: There is no edema or consolidation. Heart size and pulmonary vascularity are normal. No adenopathy. No bone lesions. IMPRESSION: No edema or consolidation. Electronically Signed   By: Bretta Bang III M.D.   On: 04/09/2019 09:00        Scheduled Meds: . Chlorhexidine Gluconate Cloth  6 each Topical Daily  . diazepam  5 mg Oral BID  . docusate sodium  100 mg Oral BID  . enoxaparin (LOVENOX) injection  40 mg Subcutaneous Q24H  . folic acid  1 mg Oral Daily  . LORazepam  0-4 mg Intravenous Q6H   Followed by  . [START ON 04/10/2019] LORazepam  0-4 mg Intravenous Q12H  . magnesium oxide  400 mg Oral BID  . multivitamin with minerals  1 tablet Oral Daily  . potassium chloride  40 mEq Oral BID  . sodium chloride flush  3 mL Intravenous Q12H  . thiamine  100 mg Oral Daily   Or  . thiamine  100 mg Intravenous Daily   Continuous Infusions: . dexmedetomidine       LOS: 1 day    Time spent: 26 minutes.    Kathlen Mody, MD [facility] Pager [phone]   If 7PM-7AM, please contact night-coverage [email or web address] Password [email or web address] 04/09/2019, 4:59 PM
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
## 2019-04-10 ENCOUNTER — Inpatient Hospital Stay (HOSPITAL_COMMUNITY): Payer: Self-pay

## 2019-04-10 ENCOUNTER — Encounter (HOSPITAL_COMMUNITY): Payer: Self-pay | Admitting: *Deleted

## 2019-04-10 LAB — CBC
HCT: 24.5 % — ABNORMAL LOW (ref 39.0–52.0)
Hemoglobin: 7.8 g/dL — ABNORMAL LOW (ref 13.0–17.0)
MCH: 29.4 pg (ref 26.0–34.0)
MCHC: 31.8 g/dL (ref 30.0–36.0)
MCV: 92.5 fL (ref 80.0–100.0)
Platelets: 473 10*3/uL — ABNORMAL HIGH (ref 150–400)
RBC: 2.65 MIL/uL — ABNORMAL LOW (ref 4.22–5.81)
RDW: 14.3 % (ref 11.5–15.5)
WBC: 5.9 10*3/uL (ref 4.0–10.5)
nRBC: 0 % (ref 0.0–0.2)

## 2019-04-10 LAB — URINALYSIS, ROUTINE W REFLEX MICROSCOPIC
Bacteria, UA: NONE SEEN
Bilirubin Urine: NEGATIVE
Glucose, UA: NEGATIVE mg/dL
Hgb urine dipstick: NEGATIVE
Ketones, ur: NEGATIVE mg/dL
Leukocytes,Ua: NEGATIVE
Nitrite: NEGATIVE
Protein, ur: NEGATIVE mg/dL
Specific Gravity, Urine: 1.004 — ABNORMAL LOW (ref 1.005–1.030)
pH: 7 (ref 5.0–8.0)

## 2019-04-10 LAB — GLUCOSE, CAPILLARY
Glucose-Capillary: 151 mg/dL — ABNORMAL HIGH (ref 70–99)
Glucose-Capillary: 112 mg/dL — ABNORMAL HIGH (ref 70–99)
Glucose-Capillary: 155 mg/dL — ABNORMAL HIGH (ref 70–99)
Glucose-Capillary: 192 mg/dL — ABNORMAL HIGH (ref 70–99)

## 2019-04-10 LAB — CBC WITH DIFFERENTIAL/PLATELET
Abs Immature Granulocytes: 0.03 10*3/uL (ref 0.00–0.07)
Basophils Absolute: 0.1 10*3/uL (ref 0.0–0.1)
Basophils Relative: 1 %
Eosinophils Absolute: 0.2 10*3/uL (ref 0.0–0.5)
Eosinophils Relative: 3 %
HCT: 25.4 % — ABNORMAL LOW (ref 39.0–52.0)
Hemoglobin: 8.3 g/dL — ABNORMAL LOW (ref 13.0–17.0)
Immature Granulocytes: 1 %
Lymphocytes Relative: 29 %
Lymphs Abs: 1.6 10*3/uL (ref 0.7–4.0)
MCH: 30.1 pg (ref 26.0–34.0)
MCHC: 32.7 g/dL (ref 30.0–36.0)
MCV: 92 fL (ref 80.0–100.0)
Monocytes Absolute: 0.5 10*3/uL (ref 0.1–1.0)
Monocytes Relative: 9 %
Neutro Abs: 3.1 10*3/uL (ref 1.7–7.7)
Neutrophils Relative %: 57 %
Platelets: 543 10*3/uL — ABNORMAL HIGH (ref 150–400)
RBC: 2.76 MIL/uL — ABNORMAL LOW (ref 4.22–5.81)
RDW: 14.3 % (ref 11.5–15.5)
WBC: 5.4 10*3/uL (ref 4.0–10.5)
nRBC: 0 % (ref 0.0–0.2)

## 2019-04-10 LAB — BASIC METABOLIC PANEL
Anion gap: 10 (ref 5–15)
BUN: 9 mg/dL (ref 6–20)
CO2: 24 mmol/L (ref 22–32)
Calcium: 8.7 mg/dL — ABNORMAL LOW (ref 8.9–10.3)
Chloride: 106 mmol/L (ref 98–111)
Creatinine, Ser: 0.65 mg/dL (ref 0.61–1.24)
GFR calc Af Amer: >60 mL/min (ref >60)
GFR calc non Af Amer: >60 mL/min (ref >60)
Glucose, Bld: 162 mg/dL — ABNORMAL HIGH (ref 70–99)
Potassium: 4.1 mmol/L (ref 3.5–5.1)
Sodium: 140 mmol/L (ref 135–145)

## 2019-04-10 LAB — MAGNESIUM: Magnesium: 1.7 mg/dL (ref 1.7–2.4)

## 2019-04-10 LAB — PHOSPHORUS: Phosphorus: 4.5 mg/dL (ref 2.5–4.6)

## 2019-04-10 LAB — SEDIMENTATION RATE: Sed Rate: 108 mm/hr — ABNORMAL HIGH (ref 0–16)

## 2019-04-10 LAB — C-REACTIVE PROTEIN: CRP: 7.2 mg/dL — ABNORMAL HIGH (ref <1.0)

## 2019-04-10 MED ORDER — TRAMADOL HCL 50 MG PO TABS
50.0000 mg | ORAL_TABLET | Freq: Four times a day (QID) | ORAL | Status: DC | PRN
  Administered 2019-04-10 – 2019-04-15 (×7): 50 mg via ORAL
  Filled 2019-04-10 (×10): qty 1

## 2019-04-10 NOTE — Progress Notes (Signed)
PT Cancellation Note  Patient Details Name: Richard Lopez MRN: [id number] DOB: 02-11-1976   Cancelled Treatment:    Reason Eval/Treat Not Completed: Medical issues which prohibited therapy. Pt with loosening of rt hip screw. Will await clarification of activity and weight bearing. Will follow up tomorrow.   Shary Decamp [facility] 04/10/2019, 3:23 PM [place] Pager [phone] Office [phone]

## 2019-04-10 NOTE — Progress Notes (Signed)
PT Cancellation Note  Patient Details Name: Richard Lopez MRN: [id number] DOB: 22-Sep-1976   Cancelled Treatment:    Reason Eval/Treat Not Completed: Other (comment). Awaiting results of rt hip x-ray. Will follow up later today.   [place] 04/10/2019, 1:56 PM [place] Pager [phone] Office [phone]

## 2019-04-10 NOTE — Progress Notes (Signed)
PROGRESS NOTE    Richard Lopez  MCN:[id number] DOB: Mar 05, 1976 DOA: 04/07/2019 PCP: Patient, No Pcp Per   Brief Narrative:  Richard Lopez is a 43 y.o. male with medical history significant of HTN, DM, ETOH dependence and  gout presenting with AMS. Richard Lopez was hospitalized from 5/22 till 5/29 for right hip fracture. Richard Lopez was admitted for Delirium tremens and overnight patient became more agitated required precedex gtt and transfer to ICU.   Assessment & Plan:   Principal Problem:   DTs (delirium tremens) (HCC) Active Problems:   Essential hypertension   Prediabetes   Encephalopathy acute   Delirium tremens (HCC)   Acute metabolic encephalopathy from  Alcohol withdrawal : Continue to watch him on CIWA, and appreciate PCCM recommendations.  Richard Lopez was off precedex for greater than 12 hours.  Transfer him to med surg.   Leukocytosis:  ? Reactive.  Normalized.  CXR no active disease. UA is negative.     Essential hypertension:  Well controlled.     Anemia of chronic disease:  Hemoglobin around 8  Transfuse to keep hemoglobin greater than 7.  Anemia panel shows low iron levels.  Iron supplementation will be added on discharge.      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  Improving.   Diet controlled DM    Hypokalemia and hypomagnesemia:  Replaced.   Right hip pain:  X rays ordered.    DVT prophylaxis: Lovenox.  Code Status: full code  Family Communication: none at bedside.  Disposition Plan: pending clinical improvement.    Consultants:   PCCM for precedex gtt.   Procedures:  None.  Antimicrobials: none.   Subjective: Alert, reports right hip pain.   Objective: Vitals:   04/10/19 1000 04/10/19 1100 04/10/19 1200 04/10/19 1300  BP:  120/76 129/81 131/77  Pulse: 79 79 65 85  Resp: 18 (!) 23 19 15   Temp:   97.6 F (36.4 C)   TempSrc:   Axillary   SpO2: 100% 100% 100% 100%  Weight:      Height:        Intake/Output Summary (Last 24  hours) at 04/10/2019 1439 Last data filed at 04/10/2019 1200 Gross per 24 hour  Intake 1452.1 ml  Output 700 ml  Net 752.1 ml   Filed Weights   04/07/19 0805 04/07/19 2000  Weight: 101 kg 104.5 kg    Examination:  General exam: alert and comfortable.  Respiratory system: diminished at bases. No wheezing.  Cardiovascular system: S1 & S2 heard, RRR. No JVD,  Gastrointestinal system: Abdomen is soft BS+non tender non distended.  Central nervous system: alert and oriented, answering questions appropriately.  Extremities: no pedal edema.  Skin: No rashes, lesions or ulcers Psychiatry: mood appropriate.     Data Reviewed: I have personally reviewed following labs and imaging studies  CBC: Recent Labs  Lab 04/07/19 0840 04/08/19 0252 04/09/19 0148 04/10/19 0652 04/10/19 1122  WBC 15.6* 11.2* 10.1 5.9 5.4  NEUTROABS 12.8*  --   --   --  3.1  HGB 8.2* 7.9* 7.4* 7.8* 8.3*  HCT 24.9* 23.6* 21.9* 24.5* 25.4*  MCV 89.9 90.1 89.0 92.5 92.0  PLT 484* 446* 459* 473* 543*   Basic Metabolic Panel: Recent Labs  Lab 04/07/19 0836 04/08/19 0252 04/09/19 0148 04/10/19 0652  NA 131* 137 138 140  K 3.3* 4.2 3.3* 4.1  CL 92* 104 103 106  CO2 25 23 24 24   GLUCOSE 148* 130* 136* 162*  BUN 13 11  12 9  CREATININE 0.84 0.72 0.68 0.65  CALCIUM 8.6* 8.7* 8.7* 8.7*  MG 1.5* 1.7 1.5* 1.7  PHOS 3.9 3.4 3.2 4.5   GFR: Estimated Creatinine Clearance: 157.1 mL/min (by C-G formula based on SCr of 0.65 mg/dL). Liver Function Tests: Recent Labs  Lab 04/07/19 0836 04/09/19 0148  AST 102* 60*  ALT 73* 54*  ALKPHOS 101 88  BILITOT 1.3* 0.7  PROT 7.1 6.6  ALBUMIN 2.7* 2.5*   No results for input(s): LIPASE, AMYLASE in the last 168 hours. No results for input(s): AMMONIA in the last 168 hours. Coagulation Profile: No results for input(s): INR, PROTIME in the last 168 hours. Cardiac Enzymes: No results for input(s): CKTOTAL, CKMB, CKMBINDEX, TROPONINI in the last 168 hours. BNP (last 3  results) No results for input(s): PROBNP in the last 8760 hours. HbA1C: No results for input(s): HGBA1C in the last 72 hours. CBG: Recent Labs  Lab 04/09/19 1121 04/09/19 1624 04/09/19 2104 04/10/19 0716 04/10/19 1107  GLUCAP 124* 126* 120* 151* 112*   Lipid Profile: No results for input(s): CHOL, HDL, LDLCALC, TRIG, CHOLHDL, LDLDIRECT in the last 72 hours. Thyroid Function Tests: No results for input(s): TSH, T4TOTAL, FREET4, T3FREE, THYROIDAB in the last 72 hours. Anemia Panel: No results for input(s): VITAMINB12, FOLATE, FERRITIN, TIBC, IRON, RETICCTPCT in the last 72 hours. Sepsis Labs: No results for input(s): PROCALCITON, LATICACIDVEN in the last 168 hours.  Recent Results (from the past 240 hour(s))  SARS Coronavirus 2 (CEPHEID - Performed in [place] hospital lab), Hosp Order     Status: None   Collection Time: 04/07/19 10:18 AM  Result Value Ref Range Status   SARS Coronavirus 2 NEGATIVE NEGATIVE Final    Comment: (NOTE) If result is NEGATIVE SARS-CoV-2 target nucleic acids are NOT DETECTED. The SARS-CoV-2 RNA is generally detectable in upper and lower  respiratory specimens during the acute phase of infection. The lowest  concentration of SARS-CoV-2 viral copies this assay can detect is 250  copies / mL. A negative result does not preclude SARS-CoV-2 infection  and should not be used as the sole basis for treatment or other  patient management decisions.  A negative result may occur with  improper specimen collection / handling, submission of specimen other  than nasopharyngeal swab, presence of viral mutation(s) within the  areas targeted by this assay, and inadequate number of viral copies  (<250 copies / mL). A negative result must be combined with clinical  observations, patient history, and epidemiological information. If result is POSITIVE SARS-CoV-2 target nucleic acids are DETECTED. The SARS-CoV-2 RNA is generally detectable in upper and lower   respiratory specimens dur ing the acute phase of infection.  Positive  results are indicative of active infection with SARS-CoV-2.  Clinical  correlation with patient history and other diagnostic information is  necessary to determine patient infection status.  Positive results do  not rule out bacterial infection or co-infection with other viruses. If result is PRESUMPTIVE POSTIVE SARS-CoV-2 nucleic acids MAY BE PRESENT.   A presumptive positive result was obtained on the submitted specimen  and confirmed on repeat testing.  While 2019 novel coronavirus  (SARS-CoV-2) nucleic acids may be present in the submitted sample  additional confirmatory testing may be necessary for epidemiological  and / or clinical management purposes  to differentiate between  SARS-CoV-2 and other Sarbecovirus currently known to infect humans.  If clinically indicated additional testing with an alternate test  methodology [phone]) is advised. The SARS-CoV-2 RNA is  generally  detectable in upper and lower respiratory sp ecimens during the acute  phase of infection. The expected result is Negative. Fact Sheet for Patients:  BoilerBrush.com.cy Fact Sheet for Healthcare Providers: https://pope.com/ This test is not yet approved or cleared by the [place] FDA and has been authorized for detection and/or diagnosis of SARS-CoV-2 by FDA under an Emergency Use Authorization (EUA).  This EUA will remain in effect (meaning this test can be used) for the duration of the COVID-19 declaration under Section 564(b)(1) of the Act, 21 U.S.C. section 360bbb-3(b)(1), unless the authorization is terminated or revoked sooner. Performed at [facility], [address] [place], [place] [address]   MRSA PCR Screening     Status: None   Collection Time: 04/07/19  4:29 PM  Result Value Ref Range Status   MRSA by PCR NEGATIVE NEGATIVE Final    Comment:        The  GeneXpert MRSA Assay (FDA approved for NASAL specimens only), is one component of a comprehensive MRSA colonization surveillance program. It is not intended to diagnose MRSA infection nor to guide or monitor treatment for MRSA infections. Performed at [facility], [address] [place], [place] [address]          Radiology Studies: Dg Chest Port 1 View  Result Date: 04/09/2019 CLINICAL DATA:  Respiratory failure.  Hypertension. EXAM: PORTABLE CHEST 1 VIEW COMPARISON:  August 24, 2010 FINDINGS: There is no edema or consolidation. Heart size and pulmonary vascularity are normal. No adenopathy. No bone lesions. IMPRESSION: No edema or consolidation. Electronically Signed   By: Bretta Bang III M.D.   On: 04/09/2019 09:00        Scheduled Meds: . Chlorhexidine Gluconate Cloth  6 each Topical Daily  . diazepam  5 mg Oral BID  . docusate sodium  100 mg Oral BID  . enoxaparin (LOVENOX) injection  40 mg Subcutaneous Q24H  . folic acid  1 mg Oral Daily  . LORazepam  0-4 mg Intravenous Q12H  . magnesium oxide  400 mg Oral BID  . multivitamin with minerals  1 tablet Oral Daily  . sodium chloride flush  3 mL Intravenous Q12H  . thiamine  100 mg Oral Daily   Or  . thiamine  100 mg Intravenous Daily   Continuous Infusions: . dexmedetomidine Stopped (04/10/19 0531)     LOS: 2 days    Time spent: 26 minutes.    Kathlen Mody, MD [facility] Pager [phone]   If 7PM-7AM, please contact night-coverage [email or web address] Password [facility] 04/10/2019, 2:39 PM
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
## 2019-04-10 NOTE — Progress Notes (Signed)
Mental status disctinctly improved all day, off precedex.   Will sign off for now, remain available as needed

## 2019-04-11 ENCOUNTER — Encounter (HOSPITAL_COMMUNITY): Payer: Self-pay | Admitting: General Practice

## 2019-04-11 ENCOUNTER — Inpatient Hospital Stay (HOSPITAL_COMMUNITY): Payer: Self-pay

## 2019-04-11 NOTE — Progress Notes (Signed)
PROGRESS NOTE    Richard MemoryCarl D Stys  ZOX:[id number] DOB: 05/04/1976 DOA: 04/07/2019 PCP: Patient, No Pcp Per   Brief Narrative:  Richard Lopez is a 43 y.o. male with medical history significant of HTN, DM, ETOH dependence and  gout presenting with AMS. He was hospitalized from 5/22 till 5/29 for right hip fracture. He was admitted for Delirium tremens and overnight patient became more agitated required precedex gtt and transfer to ICU.  He was transferred yesterday back to the floor , off precedex gtt and withdrawals have improved.    Assessment & Plan:   Principal Problem:   DTs (delirium tremens) (HCC) Active Problems:   Essential hypertension   Prediabetes   Encephalopathy acute   Delirium tremens (HCC)   Acute metabolic encephalopathy from  Alcohol withdrawal : Continue to watch him on CIWA, and appreciate PCCM recommendations.  He was off precedex for greater than 12 hours.  Transferred him to med surg.    Leukocytosis:  ? Reactive.  Normalized.  CXR no active disease. UA is negative.     Essential hypertension:  Well controlled.     Anemia of chronic disease:  Hemoglobin around 8  Transfuse to keep hemoglobin greater than 7.  Anemia panel shows low iron levels.  Iron supplementation will be added on discharge.      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  Improving.   Diet controlled DM    Hypokalemia and hypomagnesemia:  Replaced.   Right hip pain:  X rays ordered. Showed loosening of one of the screws of the right hip. Requested Dr Jena GaussHaddix to see the patient and recommend on physical therapy.  Pain control with tramadol.    DVT prophylaxis: Lovenox.  Code Status: full code  Family Communication: none at bedside.  Disposition Plan: pending clinical improvement. And orthopedics recommendations.    Consultants:   PCCM for precedex gtt.   Orthopedics for right hip pain.   Procedures:  None.  Antimicrobials: none.    Subjective: Alert, reports right hip pain.  No new complaints.   Objective: Vitals:   04/10/19 2014 04/10/19 2323 04/11/19 0410 04/11/19 0818  BP: (!) 145/85 (!) 146/75 (!) 148/87 (!) 141/92  Pulse: 87 88 79 76  Resp: 18 18 16 17   Temp: 98.2 F (36.8 C) 98.2 F (36.8 C) 98.6 F (37 C) 98.2 F (36.8 C)  TempSrc: Oral Oral Oral Oral  SpO2: 100% 99% 99% 100%  Weight:      Height:        Intake/Output Summary (Last 24 hours) at 04/11/2019 0950 Last data filed at 04/11/2019 0700 Gross per 24 hour  Intake 480 ml  Output 1400 ml  Net -920 ml   Filed Weights   04/07/19 0805 04/07/19 2000  Weight: 101 kg 104.5 kg    Examination:  General exam: alert and comfortable. Not in distress.  Respiratory system: diminished at bases. No wheezing.  Cardiovascular system: S1 & S2 heard, RRR. No JVD,  Gastrointestinal system: Abdomen is soft BS+non tender non distended.  Central nervous system: alert and oriented, answering questions appropriately.  Extremities: no pedal edema.  Skin: No rashes, lesions or ulcers Psychiatry: mood appropriate.     Data Reviewed: I have personally reviewed following labs and imaging studies  CBC: Recent Labs  Lab 04/07/19 0840 04/08/19 0252 04/09/19 0148 04/10/19 0652 04/10/19 1122  WBC 15.6* 11.2* 10.1 5.9 5.4  NEUTROABS 12.8*  --   --   --  3.1  HGB 8.2*  7.9* 7.4* 7.8* 8.3*  HCT 24.9* 23.6* 21.9* 24.5* 25.4*  MCV 89.9 90.1 89.0 92.5 92.0  PLT 484* 446* 459* 473* 235*   Basic Metabolic Panel: Recent Labs  Lab 04/07/19 0836 04/08/19 0252 04/09/19 0148 04/10/19 0652  NA 131* 137 138 140  K 3.3* 4.2 3.3* 4.1  CL 92* 104 103 106  CO2 25 23 24 24   GLUCOSE 148* 130* 136* 162*  BUN 13 11 12 9   CREATININE 0.84 0.72 0.68 0.65  CALCIUM 8.6* 8.7* 8.7* 8.7*  MG 1.5* 1.7 1.5* 1.7  PHOS 3.9 3.4 3.2 4.5   GFR: Estimated Creatinine Clearance: 157.1 mL/min (by C-G formula based on SCr of 0.65 mg/dL). Liver Function Tests: Recent Labs  Lab  04/07/19 0836 04/09/19 0148  AST 102* 60*  ALT 73* 54*  ALKPHOS 101 88  BILITOT 1.3* 0.7  PROT 7.1 6.6  ALBUMIN 2.7* 2.5*   No results for input(s): LIPASE, AMYLASE in the last 168 hours. No results for input(s): AMMONIA in the last 168 hours. Coagulation Profile: No results for input(s): INR, PROTIME in the last 168 hours. Cardiac Enzymes: No results for input(s): CKTOTAL, CKMB, CKMBINDEX, TROPONINI in the last 168 hours. BNP (last 3 results) No results for input(s): PROBNP in the last 8760 hours. HbA1C: No results for input(s): HGBA1C in the last 72 hours. CBG: Recent Labs  Lab 04/09/19 2104 04/10/19 0716 04/10/19 1107 04/10/19 1610 04/10/19 2049  GLUCAP 120* 151* 112* 155* 192*   Lipid Profile: No results for input(s): CHOL, HDL, LDLCALC, TRIG, CHOLHDL, LDLDIRECT in the last 72 hours. Thyroid Function Tests: No results for input(s): TSH, T4TOTAL, FREET4, T3FREE, THYROIDAB in the last 72 hours. Anemia Panel: No results for input(s): VITAMINB12, FOLATE, FERRITIN, TIBC, IRON, RETICCTPCT in the last 72 hours. Sepsis Labs: No results for input(s): PROCALCITON, LATICACIDVEN in the last 168 hours.  Recent Results (from the past 240 hour(s))  SARS Coronavirus 2 (CEPHEID - Performed in [place] hospital lab), Hosp Order     Status: None   Collection Time: 04/07/19 10:18 AM  Result Value Ref Range Status   SARS Coronavirus 2 NEGATIVE NEGATIVE Final    Comment: (NOTE) If result is NEGATIVE SARS-CoV-2 target nucleic acids are NOT DETECTED. The SARS-CoV-2 RNA is generally detectable in upper and lower  respiratory specimens during the acute phase of infection. The lowest  concentration of SARS-CoV-2 viral copies this assay can detect is 250  copies / mL. A negative result does not preclude SARS-CoV-2 infection  and should not be used as the sole basis for treatment or other  patient management decisions.  A negative result may occur with  improper specimen collection /  handling, submission of specimen other  than nasopharyngeal swab, presence of viral mutation(s) within the  areas targeted by this assay, and inadequate number of viral copies  (<250 copies / mL). A negative result must be combined with clinical  observations, patient history, and epidemiological information. If result is POSITIVE SARS-CoV-2 target nucleic acids are DETECTED. The SARS-CoV-2 RNA is generally detectable in upper and lower  respiratory specimens dur ing the acute phase of infection.  Positive  results are indicative of active infection with SARS-CoV-2.  Clinical  correlation with patient history and other diagnostic information is  necessary to determine patient infection status.  Positive results do  not rule out bacterial infection or co-infection with other viruses. If result is PRESUMPTIVE POSTIVE SARS-CoV-2 nucleic acids MAY BE PRESENT.   A presumptive positive result was  obtained on the submitted specimen  and confirmed on repeat testing.  While 2019 novel coronavirus  (SARS-CoV-2) nucleic acids may be present in the submitted sample  additional confirmatory testing may be necessary for epidemiological  and / or clinical management purposes  to differentiate between  SARS-CoV-2 and other Sarbecovirus currently known to infect humans.  If clinically indicated additional testing with an alternate test  methodology [phone]) is advised. The SARS-CoV-2 RNA is generally  detectable in upper and lower respiratory sp ecimens during the acute  phase of infection. The expected result is Negative. Fact Sheet for Patients:  BoilerBrush.com.cy Fact Sheet for Healthcare Providers: https://pope.com/ This test is not yet approved or cleared by the [place] FDA and has been authorized for detection and/or diagnosis of SARS-CoV-2 by FDA under an Emergency Use Authorization (EUA).  This EUA will remain in effect (meaning this  test can be used) for the duration of the COVID-19 declaration under Section 564(b)(1) of the Act, 21 U.S.C. section 360bbb-3(b)(1), unless the authorization is terminated or revoked sooner. Performed at [facility], [address] [place], [place] [address]   MRSA PCR Screening     Status: None   Collection Time: 04/07/19  4:29 PM  Result Value Ref Range Status   MRSA by PCR NEGATIVE NEGATIVE Final    Comment:        The GeneXpert MRSA Assay (FDA approved for NASAL specimens only), is one component of a comprehensive MRSA colonization surveillance program. It is not intended to diagnose MRSA infection nor to guide or monitor treatment for MRSA infections. Performed at [facility], [address], [place], [place] [address]          Radiology Studies: Dg Hip Port Unilat With Pelvis 1v Right  Result Date: 04/10/2019 CLINICAL DATA:  Delirium tremens. Status post RIGHT hip fracture. History is given of infection. EXAM: DG HIP (WITH OR WITHOUT PELVIS) 1V PORT RIGHT COMPARISON:  03/26/2019. FINDINGS: Status post ORIF RIGHT hip fracture. There appears to be loosening of one of the two anterior cannulated screws, based on the appearance of the washer as it relates to the head of the screw and the greater trochanter. IMPRESSION: Suspected loosening of one of the two anterior cannulated screws. Status post ORIF. Electronically Signed   By: Elsie Stain M.D.   On: 04/10/2019 15:04        Scheduled Meds: . Chlorhexidine Gluconate Cloth  6 each Topical Daily  . diazepam  5 mg Oral BID  . docusate sodium  100 mg Oral BID  . enoxaparin (LOVENOX) injection  40 mg Subcutaneous Q24H  . folic acid  1 mg Oral Daily  . LORazepam  0-4 mg Intravenous Q12H  . magnesium oxide  400 mg Oral BID  . multivitamin with minerals  1 tablet Oral Daily  . sodium chloride flush  3 mL Intravenous Q12H  . thiamine  100 mg Oral Daily   Or  . thiamine  100 mg Intravenous Daily    Continuous Infusions: . dexmedetomidine Stopped (04/10/19 0531)     LOS: 3 days    Time spent: 26 minutes.    Kathlen Mody, MD [facility] Pager [phone]   If 7PM-7AM, please contact night-coverage [email or web address] Password [email or web address] 04/11/2019, 9:50 AM

## 2019-04-11 NOTE — Consult Note (Signed)
Orthopaedic Trauma Service (OTS) Consult   Patient ID: Richard Lopez MRN: [id number] DOB/AGE: [birth year] 43 y.o.  Reason for Consult: right hip pain, previous intermedullary nail R femur on 03/26/19 Referring Physician: Dr. Blake Divine  HPI: Richard Lopez is an 43 y.o. male being seen in consultation at request of Dr. Blake Divine for right hip pain. Patient well known to othopaedic trauma service from previous ORIF left tibia fracture 11/2018 and most recently was hospitalized from 03/25/2019 until 04/01/2019 for right hip fracture, surgical repaired with intermedullary nailing.  Patient presented again to [facility] ED on 04/07/2019 and was admitted for Delirium tremens. Beginning yesterday patient began complaining of right hip pain. Imaging was obtained on the floor which revealed loosening of the proximal anterior cannulated screw. Orthopaedic trauma service was consulted to evaluate patient and clear him for participation in physical therapy.  Patient denies any recent falls. States he has some soreness in his right thigh and some mild pain and muscle tightness in his right groin. He has been weightbearing as tolerated on right lower extremity at home with the use of a walker. Has been tolerating this well. States that when he rotates the leg out into the frog leg position it bothers him in the groin area. Pain well controlled at rest  Past Medical History:  Diagnosis Date  . Anxiety   . 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 Depression   . Diabetes mellitus    diet controlled  . Essential hypertension   . Gastritis   . Gout     Past Surgical History:  Procedure Laterality Date  . EXTERNAL FIXATION LEG Left 11/07/2018   Procedure: EXTERNAL FIXATION LEFT LOWER LEG;  Surgeon: Samson Frederic, MD;  Location: [facility] ORS;  Service: Orthopedics;  Laterality: Left;  . EXTERNAL FIXATION REMOVAL Left 11/08/2018   Procedure: REMOVAL EXTERNAL FIXATION LEG;  Surgeon: Roby Lofts, MD;  Location: [facility] OR;  Service:  Orthopedics;  Laterality: Left;  . INTRAMEDULLARY (IM) NAIL INTERTROCHANTERIC Right 03/26/2019   Procedure: INTRAMEDULLARY (IM) NAIL INTERTROCHANTRIC;  Surgeon: Roby Lofts, MD;  Location: [facility] OR;  Service: Orthopedics;  Laterality: Right;  . NO PAST SURGERIES    . OPEN REDUCTION INTERNAL FIXATION (ORIF) TIBIA/FIBULA FRACTURE Left 11/08/2018   Procedure: OPEN REDUCTION INTERNAL FIXATION (ORIF) TIBIA/FIBULA FRACTURE;  Surgeon: Roby Lofts, MD;  Location: [facility] OR;  Service: Orthopedics;  Laterality: Left;    Family History  Problem Relation Age of Onset  . Diabetes Mellitus II Father   . Diabetes Mellitus II Other   . CAD Other     Social History:  reports that he has been smoking cigarettes. He has been smoking about 0.00 packs per day. He has never used smokeless tobacco. He reports current alcohol use of about 200.0 standard drinks of alcohol per week. He reports current drug use. Drug: Marijuana.  Allergies: No Known Allergies  Medications: I have reviewed the patient's current medications.  ROS: Constitutional: No fever or chills Vision: No changes in vision ENT: No difficulty swallowing CV: No chest pain Pulm: No SOB or wheezing GI: No nausea or vomiting GU: No urgency or inability to hold urine Skin: No poor wound healing Neurologic: No numbness or tingling Psychiatric: No depression or anxiety Heme: No bruising Allergic: No reaction to medications or food   Exam: Blood pressure (!) 157/97, pulse 80, temperature 98.5 F (36.9 C), temperature source Oral, resp. rate 16, height 6\' 6"  (1.981 m), weight 104.5 kg, SpO2 100 %.  General: Sitting up in bed, NAD Orientation: Alert and oriented x 3 Mood and Affect: Mood and affect appropriate Gait: Not assessed during current exam Coordination and balance:  Within normal limits  Right Lower Extremity Skin with well healing incisions to the hip and proximal thigh. No tenderness to palpation of hip or thigh. Mildly tender with  palpation of knee. Knee flexion actively and passively without significant discomfort. No pain with log roll of the hip, abduction, or adduction to neutral position.  Dorsiflexion/plantarflexion intact. Sensation intact distally. +DP pulse  Left lower extremity: Skin with well healing incisions over the tibia. . No tenderness to palpation. Full painless ROM, full strength in each muscle group without evidence of instability. Dorsiflexion/plantarlfexion intact.    Medical Decision Making: Imaging: X-ray and CT scan of the right hip status post intramedullary nailing of femur and percutaneous fixation of femoral neck show loosening of one of the two anterior cannulated screws   Labs:  Results for orders placed or performed during the hospital encounter of 04/07/19 (from the past 24 hour(s))  Glucose, capillary     Status: Abnormal   Collection Time: 04/10/19  4:10 PM  Result Value Ref Range   Glucose-Capillary 155 (H) 70 - 99 mg/dL   Comment 1 Notify RN    Comment 2 Document in Chart   Glucose, capillary     Status: Abnormal   Collection Time: 04/10/19  8:49 PM  Result Value Ref Range   Glucose-Capillary 192 (H) 70 - 99 mg/dL     Medical history and chart was reviewed  Assessment/Plan: 43 year old male s/p intramedullary nailing and percutaneous fixation of right proximal femur fracture on 03/26/19. Patient currently with some right groin/hip pain. Imaging revealed loosening of the proximal anterior cannulated screw in the femoral neck.  No acute surgical intervention needed at this time. Patient is okay to participate in physical therapy and I would recommend partial weightbearing on the right lower extremity. If patient has increase in pain after working with therapy, may need repeat imaging. Otherwise will plan to follow up with patient in the outpatient setting after hospital discharge    [place]. [facility] ?([phone]? (phone)

## 2019-04-11 NOTE — Progress Notes (Signed)
PT Cancellation Note  Patient Details Name: QUINTERRIUS ERRINGTON MRN: [id number] DOB: [birth year]   Cancelled Treatment:    Reason Eval/Treat Not Completed: Medical issues which prohibited therapy, awaiting Dr Doreatha Martin to see pt re: loosening of screw and precautions for mobility.   Leighton Roach, [place]  Pager [phone] Office [place] 04/11/2019, 1:12 PM

## 2019-04-12 ENCOUNTER — Inpatient Hospital Stay (HOSPITAL_COMMUNITY): Payer: Self-pay

## 2019-04-12 LAB — CBC
HCT: 25.2 % — ABNORMAL LOW (ref 39.0–52.0)
Hemoglobin: 8.3 g/dL — ABNORMAL LOW (ref 13.0–17.0)
MCH: 29.4 pg (ref 26.0–34.0)
MCHC: 32.9 g/dL (ref 30.0–36.0)
MCV: 89.4 fL (ref 80.0–100.0)
Platelets: 563 10*3/uL — ABNORMAL HIGH (ref 150–400)
RBC: 2.82 MIL/uL — ABNORMAL LOW (ref 4.22–5.81)
RDW: 13.9 % (ref 11.5–15.5)
WBC: 7.1 10*3/uL (ref 4.0–10.5)
nRBC: 0 % (ref 0.0–0.2)

## 2019-04-12 LAB — COMPREHENSIVE METABOLIC PANEL
ALT: 55 U/L — ABNORMAL HIGH (ref 0–44)
AST: 56 U/L — ABNORMAL HIGH (ref 15–41)
Albumin: 2.7 g/dL — ABNORMAL LOW (ref 3.5–5.0)
Alkaline Phosphatase: 102 U/L (ref 38–126)
Anion gap: 9 (ref 5–15)
BUN: <5 mg/dL — ABNORMAL LOW (ref 6–20)
CO2: 26 mmol/L (ref 22–32)
Calcium: 8.6 mg/dL — ABNORMAL LOW (ref 8.9–10.3)
Chloride: 103 mmol/L (ref 98–111)
Creatinine, Ser: 0.61 mg/dL (ref 0.61–1.24)
GFR calc Af Amer: >60 mL/min (ref >60)
GFR calc non Af Amer: >60 mL/min (ref >60)
Glucose, Bld: 118 mg/dL — ABNORMAL HIGH (ref 70–99)
Potassium: 3.8 mmol/L (ref 3.5–5.1)
Sodium: 138 mmol/L (ref 135–145)
Total Bilirubin: 0.7 mg/dL (ref 0.3–1.2)
Total Protein: 6.7 g/dL (ref 6.5–8.1)

## 2019-04-12 LAB — GLUCOSE, CAPILLARY: Glucose-Capillary: 100 mg/dL — ABNORMAL HIGH (ref 70–99)

## 2019-04-12 MED ORDER — IOHEXOL 300 MG/ML  SOLN
100.0000 mL | Freq: Once | INTRAMUSCULAR | Status: AC | PRN
  Administered 2019-04-12: 100 mL via INTRAVENOUS

## 2019-04-12 MED ORDER — HYDRALAZINE HCL 20 MG/ML IJ SOLN
5.0000 mg | INTRAMUSCULAR | Status: DC | PRN
  Administered 2019-04-12: 16:00:00 5 mg via INTRAVENOUS
  Filled 2019-04-12: qty 1

## 2019-04-12 MED ORDER — PROMETHAZINE HCL 25 MG/ML IJ SOLN
12.5000 mg | Freq: Four times a day (QID) | INTRAMUSCULAR | Status: DC | PRN
  Administered 2019-04-12: 12.5 mg via INTRAVENOUS
  Filled 2019-04-12: qty 1

## 2019-04-12 MED ORDER — MORPHINE SULFATE (PF) 2 MG/ML IV SOLN
1.0000 mg | INTRAVENOUS | Status: DC | PRN
  Administered 2019-04-12 – 2019-04-14 (×4): 2 mg via INTRAVENOUS
  Filled 2019-04-12 (×4): qty 1

## 2019-04-12 MED ORDER — PANTOPRAZOLE SODIUM 40 MG IV SOLR
40.0000 mg | INTRAVENOUS | Status: DC
  Administered 2019-04-12 – 2019-04-15 (×4): 40 mg via INTRAVENOUS
  Filled 2019-04-12 (×4): qty 40

## 2019-04-12 NOTE — Progress Notes (Signed)
PT Cancellation Note  Patient Details Name: Richard Lopez MRN: [id number] DOB: Aug 20, 1976   Cancelled Treatment:    Reason Eval/Treat Not Completed: Other (comment). Upon entering patient room pt actively vomiting green bile looking emesis. Pt and RN report he has been nauseated and vomiting all morning. Pt states he got up to [facility] yesterday however he didn't feel up to getting up today since he has been vomiting so much. Acute PT to return as able to complete PT Eval.  Kittie Plater, PT, DPT Acute Rehabilitation Services Pager #: [phone] Office #: [phone]    [place] Lopes 04/12/2019, 1:13 PM
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
## 2019-04-12 NOTE — Progress Notes (Signed)
Pt vomited this AM and I gave Zofran he is still not having any relief and stated that he felt as if he could not take his AM medications. RN notified MD awaiting call back for further instructions

## 2019-04-12 NOTE — Progress Notes (Signed)
Pt threw up again, green fluid. 378ml Now complaining on abd pain

## 2019-04-13 LAB — GLUCOSE, CAPILLARY: Glucose-Capillary: 135 mg/dL — ABNORMAL HIGH (ref 70–99)

## 2019-04-13 NOTE — Progress Notes (Signed)
Orthopedic Tech Progress Note Patient Details:  Richard Lopez [birth year] [id number] Therapy called requesting crutches for patient Ortho Devices Type of Ortho Device: Crutches Ortho Device/Splint Interventions: Adjustment, Application, Ordered   Post Interventions Patient Tolerated: Well Instructions Provided: Care of device, Adjustment of device   Janit Pagan 04/13/2019, 12:17 PM

## 2019-04-13 NOTE — Evaluation (Signed)
Physical Therapy Evaluation & Discharge Patient Details Name: Richard Lopez MRN: [id number] DOB: [birth year] Today's Date: 04/13/2019   History of Present Illness  Pt is a 43 y.o. male with recent R hip fx (admitted 5/22-5/29/20 s/p IM nail), now admitted 04/07/19 with AMS. R hip imaging showed loosening of screws, alignment still appropriate. Hospital course complicated by ETOH withdrawal. PMH includes HTN, DM, gout, ETOH abuse.    Clinical Impression  Patient evaluated by Physical Therapy with no further acute PT needs identified. PTA, pt has been ambulating with single crutch at home, lives with girlfriend able to assist as needed. Today, pt mod indep with mobility using crutches; reviewed therex and precautions. All education has been completed and the patient has no further questions. PT is signing off. Thank you for this referral.    Follow Up Recommendations No PT follow up    Equipment Recommendations  Crutches    Recommendations for Other Services       Precautions / Restrictions Precautions Precautions: None Restrictions Weight Bearing Restrictions: Yes RLE Weight Bearing: Partial weight bearing RLE Partial Weight Bearing Percentage or Pounds: 50      Mobility  Bed Mobility Overal bed mobility: Independent                Transfers Overall transfer level: Independent Equipment used: None;Crutches                Ambulation/Gait Ambulation/Gait assistance: Modified independent (Device/Increase time) Gait Distance (Feet): 60 Feet Assistive device: Crutches Gait Pattern/deviations: Step-to pattern;Antalgic;Decreased weight shift to right Gait velocity: Decreased   General Gait Details: Slow, antalgic gait with single crutch for RUE support; pt mod indep with this technique  Stairs Stairs: (Pt declined; able to state correct technique to ascend/descend 1 step into home with crutch)          Wheelchair Mobility    Modified Depoy (Stroke Patients  Only)       Balance Overall balance assessment: Needs assistance Sitting-balance support: Feet supported;No upper extremity supported Sitting balance-Leahy Scale: Good       Standing balance-Leahy Scale: Fair Standing balance comment: Can static stand without UE support                             Pertinent Vitals/Pain Pain Assessment: Faces Faces Pain Scale: Hurts a little bit Pain Location: RLE Pain Descriptors / Indicators: Guarding;Discomfort Pain Intervention(s): Monitored during session    Home Living Family/patient expects to be discharged to:: Private residence Living Arrangements: Spouse/significant other Available Help at Discharge: Friend(s);Available 24 hours/day(Girlfriend) Type of Home: House Home Access: Stairs to enter Entrance Stairs-Rails: None Entrance Stairs-Number of Steps: 1 Home Layout: One level Home Equipment: Crutches;Walker - standard;Bedside commode;Shower seat;Tub bench;Walker - 2 wheels      Prior Function Level of Independence: Independent with assistive device(s)         Comments: Since previous hip sx, has been ambulating with single crutch as RW bothers hands with gout     Hand Dominance        Extremity/Trunk Assessment   Upper Extremity Assessment Upper Extremity Assessment: Overall WFL for tasks assessed    Lower Extremity Assessment Lower Extremity Assessment: RLE deficits/detail RLE Deficits / Details: Hip/knee functionally at least 3/5       Communication   Communication: No difficulties  Cognition Arousal/Alertness: Awake/alert Behavior During Therapy: WFL for tasks assessed/performed Overall Cognitive Status: Within Functional Limits for tasks assessed  General Comments      Exercises Other Exercises Other Exercises: Groin/butterfly stretch; pt familiar with RLE LAQ, SLR, heel slides   Assessment/Plan    PT Assessment Patent does not  need any further PT services  PT Problem List         PT Treatment Interventions      PT Goals (Current goals can be found in the Care Plan section)  Acute Rehab PT Goals PT Goal Formulation: All assessment and education complete, DC therapy    Frequency     Barriers to discharge        Co-evaluation               AM-PAC PT "6 Clicks" Mobility  Outcome Measure Help needed turning from your back to your side while in a flat bed without using bedrails?: None Help needed moving from lying on your back to sitting on the side of a flat bed without using bedrails?: None Help needed moving to and from a bed to a chair (including a wheelchair)?: None   Help needed to walk in hospital room?: None Help needed climbing 3-5 steps with a railing? : None 6 Click Score: 20    End of Session   Activity Tolerance: Patient tolerated treatment well Patient left: in chair;with call bell/phone within reach Nurse Communication: Mobility status PT Visit Diagnosis: Pain;Difficulty in walking, not elsewhere classified (R26.2) Pain - Right/Left: Right Pain - part of body: Leg    Time: [id number] PT Time Calculation (min) (ACUTE ONLY): 17 min   Charges:   PT Evaluation $PT Eval Low Complexity: 1 Low        Richard Lopez, PT, DPT Acute Rehabilitation Services  Pager [phone] Office [phone]  Richard Lopez 04/13/2019, 11:58 AM

## 2019-04-13 NOTE — Progress Notes (Signed)
PROGRESS NOTE    [name]  GUR:[id number] DOB: [birth year] DOA: 04/07/2019 PCP: Patient, No Pcp Per   Brief Narrative:  Richard Lopez is a 43 y.o. male with medical history significant of HTN, DM, ETOH dependence, rheumatoid arthritis and  gout presenting with AMS. He was hospitalized from 5/22 till 5/29 for right hip fracture. He was admitted for Delirium tremens and overnight patient became more agitated required precedex gtt and transfer to ICU.  He was transferred to the floor, off precedex gtt and withdrawals have improved.  Patient reports right hip pain and swelling, x rays shows loosening of the screws and CT of the right hip  shows Complex right hip fracture includes both intertrochanteric and subtrochanteric components. Position and alignment are markedly improved compared to the preoperative examination. There is anterior and medial displacement of the lesser trochanter. Small amount of callus is present about the fracture but no bridging bone is identified.  Orthopedic surgery, feel that there is some loosening however alignment is still appropriate.  Recommended to continue with partial weightbearing with the walker may increase as tolerated with weightbearing.  Commended patient to follow-up with orthopedic surgery in 2 weeks after discharge.  Patient complained of severe nausea, however CT abdomen and pelvis did not show any acute abnormality.  Fluid collection with partial rim ossification in the distal iliopsoas likely reflects hematoma in developing myositis ossificans but could be due to bursitis.  Subcutaneous edema about the right hip with 2 fluid collections present which likely represent seromas or hematomas although abscess is possible. Orthopedics consulted, recommended no intervention at this time and outpatient follow up.   Pt denies any fever or chills.   04/12/2019 Pt reports persistent nausea, vomiting and abdominal since this am, despite zofran, phenergan .  Ordered CT abd and pelvis and IV ppi.     Assessment & Plan:   Principal Problem:   DTs (delirium tremens) ([place]) Active Problems:   Essential hypertension   Prediabetes   Encephalopathy acute   Delirium tremens ([place])   ##Delirium tremens -Patient required Precedex drip, ICU admission  -Continue to watch him on CIWA, . -patient was weaned off precedex. -Resolved  ##Right hip pain:  -X rays ordered. Showed loosening of one of the screws of the right hip. Requested Dr Doreatha Martin to see the patient and recommend to continue conservative management with physical therapy - CT right hip Fluid collection with partial rim ossification in the distal iliopsoas likely reflects hematoma in developing myositis ossificans but could be due to bursitis.  ##Subcutaneous edema about the right hip with 2 fluid collections present which likely represent seromas or hematomas although abscess is possible. -Pain control with tramadol and morphine prn.  -Recommend PT evaluation.    ##Persistent nausea, vomiting and abdominal pain since this morning:  DDX -alcohol induced gastritis, from pain medications -Tolerating clear liquid diet, advance as tolerated -IV zofran, phenergan and PPI.  -CT abd and pelvis did not show any acute intra-abdominal process    ##Leukocytosis:  ? Reactive.  Normalized.  CXR no active disease. UA is negative.    ##Essential hypertension:  Well controlled.     ##Acute blood loss anemia -From recent surgery -hemoglobin stable around 8  -Transfuse to keep hemoglobin greater than 7.  -Anemia panel shows low iron levels.  -Iron supplementation will be added on discharge.    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  -Improving.   ##Diet controlled DM -Hemoglobin A1c is 6 -patient will benefit starting  on metformin -Emphasized the importance of diet and exercise   ##Hypokalemia and hypomagnesemia:  Replaced.      DVT prophylaxis:  Lovenox.  Code Status: full code  Family Communication: none at bedside.  Disposition Plan: pending clinical improvement. And orthopedics recommendations.    Consultants:   PCCM for precedex gtt.   Orthopedics for right hip pain.   Procedures:  None.  Antimicrobials: none.   Subjective: Improved nausea and vomiting from yesterday.  Tolerating the clear liquids Objective: Vitals:   04/12/19 1717 04/12/19 1944 04/13/19 0430 04/13/19 1321  BP: (!) 161/99 137/80 (!) 169/104 137/88  Pulse: 80 86 92 83  Resp: 18 18 18 19   Temp: 98.4 F (36.9 C) 98.8 F (37.1 C) 98.9 F (37.2 C) 98.6 F (37 C)  TempSrc: Oral Oral Oral   SpO2: 100% 100% 100% 100%  Weight:      Height:        Intake/Output Summary (Last 24 hours) at 04/13/2019 1438 Last data filed at 04/13/2019 1040 Gross per 24 hour  Intake 240 ml  Output 1200 ml  Net -960 ml   Filed Weights   04/07/19 0805 04/07/19 2000  Weight: 101 kg 104.5 kg    Examination:  General exam: moderate distress from nausea, vomiting and abdominal pain.  Respiratory system: air entry fair,  No wheezing or rhonchi.  Cardiovascular system: S1 & S2 heard, RRR. No JVD,  Gastrointestinal system: Abdomen is soft , generalized tenderness, non distended bowel sounds good.  Central nervous system: alert and oriented, answering questions appropriately.  Extremities: no pedal edema.  Skin: No rashes, lesions or ulcers Psychiatry: mood appropriate.     Data Reviewed: I have personally reviewed following labs and imaging studies  CBC: Recent Labs  Lab 04/07/19 0840 04/08/19 0252 04/09/19 0148 04/10/19 0652 04/10/19 1122 04/12/19 1020  WBC 15.6* 11.2* 10.1 5.9 5.4 7.1  NEUTROABS 12.8*  --   --   --  3.1  --   HGB 8.2* 7.9* 7.4* 7.8* 8.3* 8.3*  HCT 24.9* 23.6* 21.9* 24.5* 25.4* 25.2*  MCV 89.9 90.1 89.0 92.5 92.0 89.4  PLT 484* 446* 459* 473* 543* 563*   Basic Metabolic Panel: Recent Labs  Lab 04/07/19 0836 04/08/19 0252 04/09/19  0148 04/10/19 0652 04/12/19 1020  NA 131* 137 138 140 138  K 3.3* 4.2 3.3* 4.1 3.8  CL 92* 104 103 106 103  CO2 25 23 24 24 26   GLUCOSE 148* 130* 136* 162* 118*  BUN 13 11 12 9  <5*  CREATININE 0.84 0.72 0.68 0.65 0.61  CALCIUM 8.6* 8.7* 8.7* 8.7* 8.6*  MG 1.5* 1.7 1.5* 1.7  --   PHOS 3.9 3.4 3.2 4.5  --    GFR: Estimated Creatinine Clearance: 157.1 mL/min (by C-G formula based on SCr of 0.61 mg/dL). Liver Function Tests: Recent Labs  Lab 04/07/19 0836 04/09/19 0148 04/12/19 1020  AST 102* 60* 56*  ALT 73* 54* 55*  ALKPHOS 101 88 102  BILITOT 1.3* 0.7 0.7  PROT 7.1 6.6 6.7  ALBUMIN 2.7* 2.5* 2.7*   No results for input(s): LIPASE, AMYLASE in the last 168 hours. No results for input(s): AMMONIA in the last 168 hours. Coagulation Profile: No results for input(s): INR, PROTIME in the last 168 hours. Cardiac Enzymes: No results for input(s): CKTOTAL, CKMB, CKMBINDEX, TROPONINI in the last 168 hours. BNP (last 3 results) No results for input(s): PROBNP in the last 8760 hours. HbA1C: No results for input(s): HGBA1C in the last 72  hours. CBG: Recent Labs  Lab 04/10/19 0716 04/10/19 1107 04/10/19 1610 04/10/19 2049 04/12/19 2159  GLUCAP 151* 112* 155* 192* 100*   Lipid Profile: No results for input(s): CHOL, HDL, LDLCALC, TRIG, CHOLHDL, LDLDIRECT in the last 72 hours. Thyroid Function Tests: No results for input(s): TSH, T4TOTAL, FREET4, T3FREE, THYROIDAB in the last 72 hours. Anemia Panel: No results for input(s): VITAMINB12, FOLATE, FERRITIN, TIBC, IRON, RETICCTPCT in the last 72 hours. Sepsis Labs: No results for input(s): PROCALCITON, LATICACIDVEN in the last 168 hours.  Recent Results (from the past 240 hour(s))  SARS Coronavirus 2 (CEPHEID - Performed in [facility] hospital lab), Hosp Order     Status: None   Collection Time: 04/07/19 10:18 AM  Result Value Ref Range Status   SARS Coronavirus 2 NEGATIVE NEGATIVE Final    Comment: (NOTE) If result is  NEGATIVE SARS-CoV-2 target nucleic acids are NOT DETECTED. The SARS-CoV-2 RNA is generally detectable in upper and lower  respiratory specimens during the acute phase of infection. The lowest  concentration of SARS-CoV-2 viral copies this assay can detect is 250  copies / mL. A negative result does not preclude SARS-CoV-2 infection  and should not be used as the sole basis for treatment or other  patient management decisions.  A negative result may occur with  improper specimen collection / handling, submission of specimen other  than nasopharyngeal swab, presence of viral mutation(s) within the  areas targeted by this assay, and inadequate number of viral copies  (<250 copies / mL). A negative result must be combined with clinical  observations, patient history, and epidemiological information. If result is POSITIVE SARS-CoV-2 target nucleic acids are DETECTED. The SARS-CoV-2 RNA is generally detectable in upper and lower  respiratory specimens dur ing the acute phase of infection.  Positive  results are indicative of active infection with SARS-CoV-2.  Clinical  correlation with patient history and other diagnostic information is  necessary to determine patient infection status.  Positive results do  not rule out bacterial infection or co-infection with other viruses. If result is PRESUMPTIVE POSTIVE SARS-CoV-2 nucleic acids MAY BE PRESENT.   A presumptive positive result was obtained on the submitted specimen  and confirmed on repeat testing.  While 2019 novel coronavirus  (SARS-CoV-2) nucleic acids may be present in the submitted sample  additional confirmatory testing may be necessary for epidemiological  and / or clinical management purposes  to differentiate between  SARS-CoV-2 and other Sarbecovirus currently known to infect humans.  If clinically indicated additional testing with an alternate test  methodology [phone]) is advised. The SARS-CoV-2 RNA is generally  detectable  in upper and lower respiratory sp ecimens during the acute  phase of infection. The expected result is Negative. Fact Sheet for Patients:  BoilerBrush.com.cy Fact Sheet for Healthcare Providers: https://pope.com/ This test is not yet approved or cleared by the [place] FDA and has been authorized for detection and/or diagnosis of SARS-CoV-2 by FDA under an Emergency Use Authorization (EUA).  This EUA will remain in effect (meaning this test can be used) for the duration of the COVID-19 declaration under Section 564(b)(1) of the Act, 21 U.S.C. section 360bbb-3(b)(1), unless the authorization is terminated or revoked sooner. Performed at [facility], [address] [place], [place] [address]   MRSA PCR Screening     Status: None   Collection Time: 04/07/19  4:29 PM  Result Value Ref Range Status   MRSA by PCR NEGATIVE NEGATIVE Final    Comment:  The GeneXpert MRSA Assay (FDA approved for NASAL specimens only), is one component of a comprehensive MRSA colonization surveillance program. It is not intended to diagnose MRSA infection nor to guide or monitor treatment for MRSA infections. Performed at [facility], [address] [place], [place] [address]          Radiology Studies: Ct Abdomen Pelvis W Contrast  Result Date: 04/12/2019 CLINICAL DATA:  Abdominal pain vomiting EXAM: CT ABDOMEN AND PELVIS WITH CONTRAST TECHNIQUE: Multidetector CT imaging of the abdomen and pelvis was performed using the standard protocol following bolus administration of intravenous contrast. CONTRAST:  100mL OMNIPAQUE IOHEXOL 300 MG/ML  SOLN COMPARISON:  CT 05/03/2010, ultrasound 04/01/2017, CT 04/11/2019 FINDINGS: Lower chest: Lung bases demonstrate no acute consolidation or pleural effusion. The heart size is within normal limits. Hepatobiliary: Subcentimeter hypodensity posterior right hepatic lobe too small to further  characterize. Multiple calcified gallstones. No biliary dilatation Pancreas: Unremarkable. No pancreatic ductal dilatation or surrounding inflammatory changes. Spleen: Normal in size without focal abnormality. Adrenals/Urinary Tract: Adrenal glands are normal. No hydronephrosis. Subcentimeter hypodensities in both kidneys too small to further characterize. Delayed bilateral nephrogram. Bladder is normal Stomach/Bowel: Stomach is within normal limits. Appendix contains small stones but is otherwise normal. Sigmoid colon diverticula without acute inflammatory change. No evidence of bowel wall thickening, distention, or inflammatory changes. Vascular/Lymphatic: Mild aortic atherosclerosis without aneurysm. No significantly enlarged lymph nodes. Reproductive: Prostate is unremarkable. Other: Negative for free air or free fluid Musculoskeletal: Asymmetric atrophy of the right iliopsoas muscle. Partially visualized intramedullary rod within the right femur across complex comminuted inter and subtrochanteric right femoral fracture. Partially visualized small fluid collections within the subcutaneous fat of the lateral hip as noted on prior CT. Fluid collection with lesser trochanteric fracture fragment and rim calcification within the anterior hip as before. IMPRESSION: 1. No CT evidence for acute intra-abdominal or pelvic abnormality. 2. Gallstones 3. Delayed bilateral nephrogram suggesting renal dysfunction. Recommend correlation with renal function tests. 4. Partially visible posttraumatic changes of the right hip. Electronically Signed   By: Jasmine PangKim  Fujinaga M.D.   On: 04/12/2019 15:50        Scheduled Meds: . Chlorhexidine Gluconate Cloth  6 each Topical Daily  . diazepam  5 mg Oral BID  . docusate sodium  100 mg Oral BID  . enoxaparin (LOVENOX) injection  40 mg Subcutaneous Q24H  . folic acid  1 mg Oral Daily  . magnesium oxide  400 mg Oral BID  . multivitamin with minerals  1 tablet Oral Daily  .  pantoprazole (PROTONIX) IV  40 mg Intravenous Q24H  . sodium chloride flush  3 mL Intravenous Q12H  . thiamine  100 mg Oral Daily   Or  . thiamine  100 mg Intravenous Daily   Continuous Infusions: . dexmedetomidine Stopped (04/10/19 0531)     LOS: 5 days    Time spent: 26 minutes.    Susa GriffinsVASIREDDY,Maico Mulvehill, MD [facility] Pager [phone]   If 7PM-7AM, please contact night-coverage [email or web address] Password [email or web address] 04/13/2019, 2:38 PM

## 2019-04-13 NOTE — Care Management (Deleted)
Patient and wife have changed their minds concerning RW, Case Manager has called Zack with [employer] for [employer].      Ricki Miller, RN BSN Case Manager [phone]

## 2019-04-14 ENCOUNTER — Ambulatory Visit: Payer: Self-pay | Admitting: Primary Care

## 2019-04-14 DIAGNOSIS — E559 Vitamin D deficiency, unspecified: Secondary | ICD-10-CM

## 2019-04-14 DIAGNOSIS — S72141A Displaced intertrochanteric fracture of right femur, initial encounter for closed fracture: Secondary | ICD-10-CM

## 2019-04-14 HISTORY — DX: Displaced intertrochanteric fracture of right femur, initial encounter for closed fracture: S72.141A

## 2019-04-14 LAB — GLUCOSE, CAPILLARY: Glucose-Capillary: 115 mg/dL — ABNORMAL HIGH (ref 70–99)

## 2019-04-14 MED ORDER — PANTOPRAZOLE SODIUM 40 MG IV SOLR: 40.0000 mg | Freq: Two times a day (BID) | INTRAVENOUS | Status: DC

## 2019-04-14 MED ORDER — AMLODIPINE BESYLATE 10 MG PO TABS
10.0000 mg | ORAL_TABLET | Freq: Every day | ORAL | Status: DC
  Administered 2019-04-14 – 2019-04-15 (×2): 10 mg via ORAL
  Filled 2019-04-14 (×2): qty 1

## 2019-04-14 MED ORDER — LIDOCAINE VISCOUS HCL 2 % MT SOLN
15.0000 mL | Freq: Once | OROMUCOSAL | Status: AC
  Administered 2019-04-14: 15 mL via ORAL
  Filled 2019-04-14: qty 15

## 2019-04-14 MED ORDER — ZOLPIDEM TARTRATE 5 MG PO TABS
5.0000 mg | ORAL_TABLET | Freq: Once | ORAL | Status: AC
  Administered 2019-04-14: 5 mg via ORAL
  Filled 2019-04-14: qty 1

## 2019-04-14 MED ORDER — ALUM & MAG HYDROXIDE-SIMETH 200-200-20 MG/5ML PO SUSP
30.0000 mL | Freq: Once | ORAL | Status: AC
  Administered 2019-04-14: 30 mL via ORAL
  Filled 2019-04-14: qty 30

## 2019-04-14 MED ORDER — SODIUM CHLORIDE 0.9% FLUSH
10.0000 mL | INTRAVENOUS | Status: DC | PRN
  Administered 2019-04-15: 04:00:00 10 mL
  Filled 2019-04-14: qty 40

## 2019-04-14 MED ORDER — HYDRALAZINE HCL 20 MG/ML IJ SOLN
20.0000 mg | INTRAMUSCULAR | Status: DC | PRN
  Administered 2019-04-14: 18:00:00 20 mg via INTRAVENOUS
  Filled 2019-04-14: qty 1

## 2019-04-14 MED ORDER — SODIUM CHLORIDE 0.9% FLUSH
10.0000 mL | Freq: Two times a day (BID) | INTRAVENOUS | Status: DC
  Administered 2019-04-14 – 2019-04-15 (×2): 10 mL

## 2019-04-14 NOTE — Progress Notes (Signed)
PROGRESS NOTE    Richard Lopez  VOP:[id number] DOB: 06-21-1976 DOA: 04/07/2019 PCP: [name], NP   Brief Narrative:  Richard Lopez is a 43 y.o. male with medical history significant of HTN, DM, ETOH dependence, rheumatoid arthritis and  gout presenting with AMS. He was hospitalized from 5/22 till 5/29 for right hip fracture. He was admitted for Delirium tremens and overnight patient became more agitated required precedex gtt and transfer to ICU.  He was transferred to the floor, off precedex gtt and withdrawals have improved.  Patient reports right hip pain and swelling, x rays shows loosening of the screws and CT of the right hip  shows Complex right hip fracture includes both intertrochanteric and subtrochanteric components. Position and alignment are markedly improved compared to the preoperative examination. There is anterior and medial displacement of the lesser trochanter. Small amount of callus is present about the fracture but no bridging bone is identified.  Orthopedic surgery, feel that there is some loosening however alignment is still appropriate.  Recommended to continue with partial weightbearing with the walker may increase as tolerated with weightbearing.  Commended patient to follow-up with orthopedic surgery in 2 weeks after discharge.  Patient complained of severe nausea, however CT abdomen and pelvis did not show any acute abnormality.  Fluid collection with partial rim ossification in the distal iliopsoas likely reflects hematoma in developing myositis ossificans but could be due to bursitis.  Subcutaneous edema about the right hip with 2 fluid collections present which likely represent seromas or hematomas although abscess is possible. Orthopedics consulted, recommended no intervention at this time and outpatient follow up.      Assessment & Plan:   Principal Problem:   DTs (delirium tremens) (HCC) Active Problems:   Essential hypertension    Prediabetes   Encephalopathy acute   Delirium tremens (HCC)   Intertrochanteric fracture of right hip (HCC)   Vitamin D deficiency   ##Delirium tremens -Patient required Precedex drip, ICU admission  -Continue to watch him on CIWA, . -patient was weaned off precedex. -Resolved  ##Right hip pain:  -X rays ordered. Showed loosening of one of the screws of the right hip. Requested Dr Jena Gauss to see the patient and recommend to continue conservative management with physical therapy - CT right hip Fluid collection with partial rim ossification in the distal iliopsoas likely reflects hematoma in developing myositis ossificans but could be due to bursitis.  ##Subcutaneous edema about the right hip with 2 fluid collections present which likely represent seromas or hematomas although abscess is possible. -Pain control with tramadol and morphine prn.  -Recommend PT evaluation.    ##Persistent nausea, vomiting and abdominal pain  DDX -alcohol induced gastritis, from pain medications -Tolerating clear liquid diet, advance as tolerated -IV zofran, phenergan and PPI.  -CT abd and pelvis-did not show any acute intra-abdominal process -Keep the patient n.p.o. -Continue with the Protonix 40 mg IV twice daily -Discontinued indomethacin.    ##Leukocytosis:  ? Reactive.  Normalized.  CXR no active disease. UA is negative.    ##Hypertension accelerated  -Keep the patient on amlodipine -Hydralazine as needed   ##Acute blood loss anemia -From recent surgery -hemoglobin stable around 8  -Transfuse to keep hemoglobin greater than 7.  -Anemia panel shows low iron levels.  -Iron supplementation will be added on discharge.    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  -Improving.   ##Diet controlled DM -Hemoglobin A1c is 6 -patient will benefit starting on metformin -Emphasized the  importance of diet and exercise   ##Hypokalemia and hypomagnesemia:  Replaced.       DVT prophylaxis: Lovenox.  Code Status: full code  Family Communication: none at bedside.  Disposition Plan: pending clinical improvement. And orthopedics recommendations.    Consultants:   PCCM for precedex gtt.   Orthopedics for right hip pain.   Procedures:  None.  Antimicrobials: none.   Subjective: Improved nausea and vomiting from yesterday.  Tolerating the clear liquids Objective: Vitals:   04/14/19 0803 04/14/19 0809 04/14/19 0815 04/14/19 0822  BP: (!) 173/105 (!) 164/96 (!) 159/103 (!) 145/84  Pulse:      Resp:      Temp:      TempSrc:      SpO2:      Weight:      Height:        Intake/Output Summary (Last 24 hours) at 04/14/2019 1547 Last data filed at 04/14/2019 1439 Gross per 24 hour  Intake 840 ml  Output 2620 ml  Net -1780 ml   Filed Weights   04/07/19 0805 04/07/19 2000  Weight: 101 kg 104.5 kg    Examination:  General exam: moderate distress from nausea, vomiting and abdominal pain.  Respiratory system: air entry fair,  No wheezing or rhonchi.  Cardiovascular system: S1 & S2 heard, RRR. No JVD,  Gastrointestinal system: Abdomen is soft , generalized tenderness, non distended bowel sounds good.  Central nervous system: alert and oriented, answering questions appropriately.  Extremities: no pedal edema.  Skin: No rashes, lesions or ulcers Psychiatry: mood appropriate.     Data Reviewed: I have personally reviewed following labs and imaging studies  CBC: Recent Labs  Lab 04/08/19 0252 04/09/19 0148 04/10/19 0652 04/10/19 1122 04/12/19 1020  WBC 11.2* 10.1 5.9 5.4 7.1  NEUTROABS  --   --   --  3.1  --   HGB 7.9* 7.4* 7.8* 8.3* 8.3*  HCT 23.6* 21.9* 24.5* 25.4* 25.2*  MCV 90.1 89.0 92.5 92.0 89.4  PLT 446* 459* 473* 543* 563*   Basic Metabolic Panel: Recent Labs  Lab 04/08/19 0252 04/09/19 0148 04/10/19 0652 04/12/19 1020  NA 137 138 140 138  K 4.2 3.3* 4.1 3.8  CL 104 103 106 103  CO2 23 24 24 26   GLUCOSE 130*  136* 162* 118*  BUN 11 12 9  <5*  CREATININE 0.72 0.68 0.65 0.61  CALCIUM 8.7* 8.7* 8.7* 8.6*  MG 1.7 1.5* 1.7  --   PHOS 3.4 3.2 4.5  --    GFR: Estimated Creatinine Clearance: 157.1 mL/min (by C-G formula based on SCr of 0.61 mg/dL). Liver Function Tests: Recent Labs  Lab 04/09/19 0148 04/12/19 1020  AST 60* 56*  ALT 54* 55*  ALKPHOS 88 102  BILITOT 0.7 0.7  PROT 6.6 6.7  ALBUMIN 2.5* 2.7*   No results for input(s): LIPASE, AMYLASE in the last 168 hours. No results for input(s): AMMONIA in the last 168 hours. Coagulation Profile: No results for input(s): INR, PROTIME in the last 168 hours. Cardiac Enzymes: No results for input(s): CKTOTAL, CKMB, CKMBINDEX, TROPONINI in the last 168 hours. BNP (last 3 results) No results for input(s): PROBNP in the last 8760 hours. HbA1C: No results for input(s): HGBA1C in the last 72 hours. CBG: Recent Labs  Lab 04/10/19 1107 04/10/19 1610 04/10/19 2049 04/12/19 2159 04/13/19 2200  GLUCAP 112* 155* 192* 100* 135*   Lipid Profile: No results for input(s): CHOL, HDL, LDLCALC, TRIG, CHOLHDL, LDLDIRECT in the last 72 hours.  Thyroid Function Tests: No results for input(s): TSH, T4TOTAL, FREET4, T3FREE, THYROIDAB in the last 72 hours. Anemia Panel: No results for input(s): VITAMINB12, FOLATE, FERRITIN, TIBC, IRON, RETICCTPCT in the last 72 hours. Sepsis Labs: No results for input(s): PROCALCITON, LATICACIDVEN in the last 168 hours.  Recent Results (from the past 240 hour(s))  SARS Coronavirus 2 (CEPHEID - Performed in [facility] hospital lab), Hosp Order     Status: None   Collection Time: 04/07/19 10:18 AM   Specimen: Nasopharyngeal Swab  Result Value Ref Range Status   SARS Coronavirus 2 NEGATIVE NEGATIVE Final    Comment: (NOTE) If result is NEGATIVE SARS-CoV-2 target nucleic acids are NOT DETECTED. The SARS-CoV-2 RNA is generally detectable in upper and lower  respiratory specimens during the acute phase of infection. The  lowest  concentration of SARS-CoV-2 viral copies this assay can detect is 250  copies / mL. A negative result does not preclude SARS-CoV-2 infection  and should not be used as the sole basis for treatment or other  patient management decisions.  A negative result may occur with  improper specimen collection / handling, submission of specimen other  than nasopharyngeal swab, presence of viral mutation(s) within the  areas targeted by this assay, and inadequate number of viral copies  (<250 copies / mL). A negative result must be combined with clinical  observations, patient history, and epidemiological information. If result is POSITIVE SARS-CoV-2 target nucleic acids are DETECTED. The SARS-CoV-2 RNA is generally detectable in upper and lower  respiratory specimens dur ing the acute phase of infection.  Positive  results are indicative of active infection with SARS-CoV-2.  Clinical  correlation with patient history and other diagnostic information is  necessary to determine patient infection status.  Positive results do  not rule out bacterial infection or co-infection with other viruses. If result is PRESUMPTIVE POSTIVE SARS-CoV-2 nucleic acids MAY BE PRESENT.   A presumptive positive result was obtained on the submitted specimen  and confirmed on repeat testing.  While 2019 novel coronavirus  (SARS-CoV-2) nucleic acids may be present in the submitted sample  additional confirmatory testing may be necessary for epidemiological  and / or clinical management purposes  to differentiate between  SARS-CoV-2 and other Sarbecovirus currently known to infect humans.  If clinically indicated additional testing with an alternate test  methodology [id number]) is advised. The SARS-CoV-2 RNA is generally  detectable in upper and lower respiratory sp ecimens during the acute  phase of infection. The expected result is Negative. Fact Sheet for Patients:   BoilerBrush.com.cyhttps://www.fda.gov/media/136312/download Fact Sheet for Healthcare Providers: https://pope.com/https://www.fda.gov/media/136313/download This test is not yet approved or cleared by the [place] FDA and has been authorized for detection and/or diagnosis of SARS-CoV-2 by FDA under an Emergency Use Authorization (EUA).  This EUA will remain in effect (meaning this test can be used) for the duration of the COVID-19 declaration under Section 564(b)(1) of the Act, 21 U.S.C. section 360bbb-3(b)(1), unless the authorization is terminated or revoked sooner. Performed at [facility], [address] [place], [place] [address]   MRSA PCR Screening     Status: None   Collection Time: 04/07/19  4:29 PM   Specimen: Nasopharyngeal  Result Value Ref Range Status   MRSA by PCR NEGATIVE NEGATIVE Final    Comment:        The GeneXpert MRSA Assay (FDA approved for NASAL specimens only), is one component of a comprehensive MRSA colonization surveillance program. It is not intended to diagnose  MRSA infection nor to guide or monitor treatment for MRSA infections. Performed at [facility], [place] [address] [place], [place] [address]          Radiology Studies: No results found.      Scheduled Meds:  Chlorhexidine Gluconate Cloth  6 each Topical Daily   docusate sodium  100 mg Oral BID   enoxaparin (LOVENOX) injection  40 mg Subcutaneous V03J   folic acid  1 mg Oral Daily   magnesium oxide  400 mg Oral BID   multivitamin with minerals  1 tablet Oral Daily   pantoprazole (PROTONIX) IV  40 mg Intravenous Q24H   sodium chloride flush  3 mL Intravenous Q12H   thiamine  100 mg Oral Daily   Or   thiamine  100 mg Intravenous Daily   Continuous Infusions:  dexmedetomidine Stopped (04/10/19 0531)     LOS: 6 days    Time spent: 26 minutes.    Monica Becton, MD [facility] Pager [phone]   If 7PM-7AM, please contact night-coverage [email or web address] Password  [facility] 04/14/2019, 3:47 PM

## 2019-04-14 NOTE — Progress Notes (Signed)
Pt alert, able to make needs known. Informed that bp was elevated. Upon entering the room pt stated "I just don't feel good". Pt was noted to be anxious, squirming and unable to stay still. C/o excruciating pain to epigastric area. Pt vomited 124ml of green fluid. Pt medicated for BP, pain and anxiety. MD made aware and new orders received. Continue to monitor.

## 2019-04-14 NOTE — Care Plan (Signed)
  Problem: Coping: Goal: Level of anxiety will decrease Outcome: Progressing   Problem: Pain Managment: Goal: General experience of comfort will improve Outcome: Progressing   

## 2019-04-14 NOTE — Plan of Care (Signed)
  Problem: Coping: Goal: Level of anxiety will decrease Outcome: Progressing   Problem: Pain Managment: Goal: General experience of comfort will improve Outcome: Progressing   

## 2019-04-15 LAB — BASIC METABOLIC PANEL
Anion gap: 11 (ref 5–15)
BUN: 6 mg/dL (ref 6–20)
CO2: 25 mmol/L (ref 22–32)
Calcium: 9 mg/dL (ref 8.9–10.3)
Chloride: 98 mmol/L (ref 98–111)
Creatinine, Ser: 0.65 mg/dL (ref 0.61–1.24)
GFR calc Af Amer: >60 mL/min (ref >60)
GFR calc non Af Amer: >60 mL/min (ref >60)
Glucose, Bld: 158 mg/dL — ABNORMAL HIGH (ref 70–99)
Potassium: 3.7 mmol/L (ref 3.5–5.1)
Sodium: 134 mmol/L — ABNORMAL LOW (ref 135–145)

## 2019-04-15 LAB — CBC
HCT: 27.7 % — ABNORMAL LOW (ref 39.0–52.0)
Hemoglobin: 9.2 g/dL — ABNORMAL LOW (ref 13.0–17.0)
MCH: 29.6 pg (ref 26.0–34.0)
MCHC: 33.2 g/dL (ref 30.0–36.0)
MCV: 89.1 fL (ref 80.0–100.0)
Platelets: 459 10*3/uL — ABNORMAL HIGH (ref 150–400)
RBC: 3.11 MIL/uL — ABNORMAL LOW (ref 4.22–5.81)
RDW: 13.9 % (ref 11.5–15.5)
WBC: 6.8 10*3/uL (ref 4.0–10.5)
nRBC: 0 % (ref 0.0–0.2)

## 2019-04-15 MED ORDER — THIAMINE HCL 100 MG PO TABS: 100.0000 mg | ORAL_TABLET | Freq: Every day | ORAL | 0 refills | Status: DC

## 2019-04-15 MED ORDER — PANTOPRAZOLE SODIUM 40 MG PO TBEC: 40.0000 mg | DELAYED_RELEASE_TABLET | Freq: Two times a day (BID) | ORAL | 0 refills | Status: DC

## 2019-04-15 MED ORDER — AMLODIPINE BESYLATE 10 MG PO TABS: 10.0000 mg | ORAL_TABLET | Freq: Every day | ORAL | 1 refills | Status: DC

## 2019-04-15 NOTE — Plan of Care (Signed)
  Problem: Education: Goal: Knowledge of General Education information will improve Description: Including pain rating scale, medication(s)/side effects and non-pharmacologic comfort measures Outcome: Progressing   Problem: Clinical Measurements: Goal: Ability to maintain clinical measurements within normal limits will improve Outcome: Progressing   Problem: Clinical Measurements: Goal: Cardiovascular complication will be avoided Outcome: Progressing   Problem: Coping: Goal: Level of anxiety will decrease Outcome: Progressing

## 2019-04-15 NOTE — Plan of Care (Signed)
  Problem: Education: Goal: Knowledge of General Education information will improve Description: Including pain rating scale, medication(s)/side effects and non-pharmacologic comfort measures 04/15/2019 1038 by Chaney Malling, RN Outcome: Progressing 04/15/2019 0733 by Chaney Malling, RN Outcome: Progressing 04/15/2019 0732 by Chaney Malling, RN Outcome: Progressing   Problem: Health Behavior/Discharge Planning: Goal: Ability to manage health-related needs will improve 04/15/2019 1038 by Chaney Malling, RN Outcome: Progressing 04/15/2019 0733 by Chaney Malling, RN Outcome: Progressing 04/15/2019 0732 by Dineen Kid D, RN Outcome: Progressing   Problem: Clinical Measurements: Goal: Ability to maintain clinical measurements within normal limits will improve 04/15/2019 1038 by Dineen Kid D, RN Outcome: Progressing 04/15/2019 0733 by Chaney Malling, RN Outcome: Progressing 04/15/2019 0732 by Dineen Kid D, RN Outcome: Progressing Goal: Will remain free from infection 04/15/2019 1038 by Dineen Kid D, RN Outcome: Progressing 04/15/2019 0733 by Chaney Malling, RN Outcome: Progressing 04/15/2019 0732 by Dineen Kid D, RN Outcome: Progressing Goal: Diagnostic test results will improve 04/15/2019 1038 by Chaney Malling, RN Outcome: Progressing 04/15/2019 0733 by Chaney Malling, RN Outcome: Progressing 04/15/2019 0732 by Dineen Kid D, RN Outcome: Progressing Goal: Respiratory complications will improve 04/15/2019 1038 by Chaney Malling, RN Outcome: Progressing 04/15/2019 0733 by Chaney Malling, RN Outcome: Progressing 04/15/2019 0732 by Dineen Kid D, RN Outcome: Progressing Goal: Cardiovascular complication will be avoided 04/15/2019 1038 by Chaney Malling, RN Outcome: Progressing 04/15/2019 0733 by Chaney Malling, RN Outcome: Progressing 04/15/2019 0732 by Chaney Malling, RN Outcome: Progressing   Problem: Activity: Goal: Risk for activity intolerance will decrease 04/15/2019  1038 by Chaney Malling, RN Outcome: Progressing 04/15/2019 0733 by Chaney Malling, RN Outcome: Progressing 04/15/2019 0732 by Chaney Malling, RN Outcome: Progressing   Problem: Nutrition: Goal: Adequate nutrition will be maintained 04/15/2019 1038 by Chaney Malling, RN Outcome: Progressing 04/15/2019 0733 by Chaney Malling, RN Outcome: Progressing 04/15/2019 0732 by Dineen Kid D, RN Outcome: Progressing   Problem: Coping: Goal: Level of anxiety will decrease 04/15/2019 1038 by Chaney Malling, RN Outcome: Progressing 04/15/2019 0733 by Chaney Malling, RN Outcome: Progressing 04/15/2019 0732 by Dineen Kid D, RN Outcome: Progressing   Problem: Elimination: Goal: Will not experience complications related to bowel motility 04/15/2019 1038 by Chaney Malling, RN Outcome: Progressing 04/15/2019 0733 by Chaney Malling, RN Outcome: Progressing 04/15/2019 0732 by Dineen Kid D, RN Outcome: Progressing Goal: Will not experience complications related to urinary retention 04/15/2019 1038 by Chaney Malling, RN Outcome: Progressing 04/15/2019 0733 by Chaney Malling, RN Outcome: Progressing 04/15/2019 0732 by Chaney Malling, RN Outcome: Progressing   Problem: Pain Managment: Goal: General experience of comfort will improve 04/15/2019 1038 by Chaney Malling, RN Outcome: Progressing 04/15/2019 0733 by Chaney Malling, RN Outcome: Progressing 04/15/2019 0732 by Dineen Kid D, RN Outcome: Progressing   Problem: Safety: Goal: Ability to remain free from injury will improve 04/15/2019 1038 by Chaney Malling, RN Outcome: Progressing 04/15/2019 0733 by Chaney Malling, RN Outcome: Progressing 04/15/2019 0732 by Dineen Kid D, RN Outcome: Progressing   Problem: Skin Integrity: Goal: Risk for impaired skin integrity will decrease 04/15/2019 1038 by Chaney Malling, RN Outcome: Progressing 04/15/2019 0733 by Chaney Malling, RN Outcome: Progressing 04/15/2019 0732 by Chaney Malling, RN Outcome:  Progressing

## 2019-04-15 NOTE — Evaluation (Signed)
Physical Therapy Evaluation Patient Details Name: Richard Lopez MRN: [id number] DOB: 1975/12/04 Today's Date: 04/15/2019   History of Present Illness  Pt is a 43 y.o. male with recent R hip fx (admitted 5/22-5/29/20 s/p IM nail), now admitted 04/07/19 with AMS. R hip imaging showed loosening of screws, alignment still appropriate. Hospital course complicated by ETOH withdrawal. PMH includes HTN, DM, gout, ETOH abuse.  Clinical Impression  Pt admitted with above. Pt ambulating hallway x 80 feet modI with bilateral crutches. Pt presents with good pain control and knowledge of precautions. Reviewed and performed RLE home exercise program. Pt declining practicing steps and able to verbalize correct technique. Pt with no further questions/concerns. No PT follow up recommended. Pt d/c from hospital.     Follow Up Recommendations No PT follow up    Equipment Recommendations  None recommended by PT    Recommendations for Other Services       Precautions / Restrictions Precautions Precautions: None Restrictions Weight Bearing Restrictions: Yes RLE Weight Bearing: Partial weight bearing RLE Partial Weight Bearing Percentage or Pounds: 50      Mobility  Bed Mobility Overal bed mobility: Independent                Transfers Overall transfer level: Modified independent               General transfer comment: Cues for right foot placement  Ambulation/Gait Ambulation/Gait assistance: Modified independent (Device/Increase time) Gait Distance (Feet): 80 Feet Assistive device: Crutches Gait Pattern/deviations: Antalgic;Decreased weight shift to right;Step-through pattern;Wide base of support Gait velocity: Decreased   General Gait Details: 3 point technique with crutches, trunk slightly flexed, good sequencing.   Stairs            Wheelchair Mobility    Modified Schmoker (Stroke Patients Only)       Balance Overall balance assessment: Mild deficits observed, not  formally tested                                           Pertinent Vitals/Pain Pain Assessment: No/denies pain    Home Living Family/patient expects to be discharged to:: Private residence Living Arrangements: Spouse/significant other Available Help at Discharge: Friend(s);Available 24 hours/day(girlfriend) Type of Home: House Home Access: Stairs to enter Entrance Stairs-Rails: None Entrance Stairs-Number of Steps: 1 Home Layout: One level Home Equipment: Crutches;Walker - standard;Bedside commode;Shower seat;Tub bench;Walker - 2 wheels      Prior Function Level of Independence: Independent with assistive device(s)         Comments: Since previous hip sx, has been ambulating with single crutch as RW bothers hands with gout     Hand Dominance        Extremity/Trunk Assessment   Upper Extremity Assessment Upper Extremity Assessment: Overall WFL for tasks assessed    Lower Extremity Assessment Lower Extremity Assessment: RLE deficits/detail RLE Deficits / Details: Hip/knee functionally at least 3/5    Cervical / Trunk Assessment Cervical / Trunk Assessment: Normal  Communication   Communication: No difficulties  Cognition Arousal/Alertness: Awake/alert Behavior During Therapy: WFL for tasks assessed/performed Overall Cognitive Status: Within Functional Limits for tasks assessed                                        General Comments  Exercises General Exercises - Lower Extremity Long Arc Quad: 10 reps;Right;Seated Hip Flexion/Marching: 10 reps;Right;Seated Other Exercises Other Exercises: Reviewed HEP including RLE LAQ's, marching, heel slides, AP's, stretching   Assessment/Plan    PT Assessment Patent does not need any further PT services  PT Problem List Decreased strength;Decreased balance;Pain;Decreased range of motion;Decreased mobility;Cardiopulmonary status limiting activity;Decreased skin integrity;Decreased  activity tolerance       PT Treatment Interventions      PT Goals (Current goals can be found in the Care Plan section)  Acute Rehab PT Goals Patient Stated Goal: go home today PT Goal Formulation: All assessment and education complete, DC therapy    Frequency     Barriers to discharge        Co-evaluation               AM-PAC PT "6 Clicks" Mobility  Outcome Measure Help needed turning from your back to your side while in a flat bed without using bedrails?: None Help needed moving from lying on your back to sitting on the side of a flat bed without using bedrails?: None Help needed moving to and from a bed to a chair (including a wheelchair)?: None Help needed standing up from a chair using your arms (e.g., wheelchair or bedside chair)?: None Help needed to walk in hospital room?: None Help needed climbing 3-5 steps with a railing? : None 6 Click Score: 24    End of Session   Activity Tolerance: Patient tolerated treatment well Patient left: in bed;with call bell/phone within reach Nurse Communication: Mobility status PT Visit Diagnosis: Pain;Difficulty in walking, not elsewhere classified (R26.2) Pain - Right/Left: Right Pain - part of body: Leg    Time: 3149-7026 PT Time Calculation (min) (ACUTE ONLY): 10 min   Charges:   PT Evaluation $PT Eval Low Complexity: 1 Low          Laurina Bustle, PT, DPT Acute Rehabilitation Services Pager [phone] Office [phone]   Richard Lopez 04/15/2019, 1:07 PM

## 2019-04-15 NOTE — Plan of Care (Signed)
  Problem: Coping: Goal: Level of anxiety will decrease 04/15/2019 0733 by Chaney Malling, RN Outcome: Progressing 04/15/2019 0732 by Chaney Malling, RN Outcome: Progressing   Problem: Education: Goal: Knowledge of General Education information will improve Description: Including pain rating scale, medication(s)/side effects and non-pharmacologic comfort measures 04/15/2019 0733 by Chaney Malling, RN Outcome: Progressing 04/15/2019 0732 by Chaney Malling, RN Outcome: Progressing

## 2019-04-15 NOTE — Discharge Summary (Signed)
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 knee films may be helpful. Prominent nutrient foramen is noted in the midshaft of the femur. IMPRESSION: Comminuted intratrochanteric fracture of the right proximal femur. Large knee joint effusion suspicious for underlying fracture about the knee joint. Dedicated films are recommended. Electronically Signed   By: Alcide CleverMark  Lukens M.D.   On: 03/25/2019 20:17    Microbiology: Recent Results (from the past 240 hour(s))  SARS Coronavirus 2 (CEPHEID - Performed in [facility] hospital lab), Hosp Order     Status: None   Collection Time: 04/07/19 10:18 AM   Specimen: Nasopharyngeal Swab  Result Value Ref Range Status   SARS Coronavirus 2 NEGATIVE NEGATIVE Final    Comment: (NOTE) If result is NEGATIVE SARS-CoV-2 target nucleic acids are NOT DETECTED. The SARS-CoV-2 RNA is generally detectable in upper and lower  respiratory specimens during the acute phase of infection. The lowest  concentration of SARS-CoV-2 viral copies this assay can detect is 250  copies / mL. A negative result does not preclude SARS-CoV-2 infection  and should not be used as the sole basis  for treatment or other  patient management decisions.  A negative result may occur with  improper specimen collection / handling, submission of specimen other  than nasopharyngeal swab, presence of viral mutation(s) within the  areas targeted by this assay, and inadequate number of viral copies  (<250 copies / mL). A negative result must be combined with clinical  observations, patient history, and epidemiological information. If result is POSITIVE SARS-CoV-2 target nucleic acids are DETECTED. The SARS-CoV-2 RNA is generally detectable in upper and lower  respiratory specimens dur ing the acute phase of infection.  Positive  results are indicative of active infection with SARS-CoV-2.  Clinical  correlation with patient history and other diagnostic information is  necessary to determine patient infection status.  Positive results do  not rule out bacterial infection or co-infection with other viruses. If result is PRESUMPTIVE POSTIVE SARS-CoV-2 nucleic acids MAY BE PRESENT.   A presumptive positive result was obtained on the submitted specimen  and confirmed on repeat testing.  While 2019 novel coronavirus  (SARS-CoV-2) nucleic acids may be present in the submitted sample  additional confirmatory testing may be necessary for epidemiological  and / or clinical management purposes  to differentiate between  SARS-CoV-2 and other Sarbecovirus currently known to infect humans.  If clinically indicated additional testing with an alternate test  methodology [phone] is advised. The SARS-CoV-2 RNA is generally  detectable in upper and lower respiratory sp ecimens during the acute  phase of infection. The expected result is Negative. Fact Sheet for Patients:  BoilerBrush.com.cyhttps://www.fda.gov/media/136312/download Fact Sheet for Healthcare Providers: https://pope.com/https://www.fda.gov/media/136313/download This test is not yet approved or cleared by the [place] FDA  and has been authorized for detection and/or  diagnosis of SARS-CoV-2 by FDA under an Emergency Use Authorization (EUA).  This EUA will remain in effect (meaning this test can be used) for the duration of the COVID-19 declaration under Section 564(b)(1) of the Act, 21 U.S.C. section 360bbb-3(b)(1), unless the authorization is terminated or revoked sooner. Performed at [facility], [address], [place], [place] [address]   MRSA PCR Screening     Status: None   Collection Time: 04/07/19  4:29 PM   Specimen: Nasopharyngeal  Result Value Ref Range Status   MRSA by PCR NEGATIVE NEGATIVE Final    Comment:        The GeneXpert MRSA Assay (FDA approved for NASAL specimens only), is one component of a comprehensive MRSA colonization surveillance program. It is not intended to diagnose MRSA infection nor to guide or monitor treatment for MRSA infections. Performed at [facility], [address], [place], [place] [address]      Labs: Basic Metabolic Panel: Recent Labs  Lab 04/09/19 0148 04/10/19 0652 04/12/19 1020 04/15/19 0305  NA 138 140 138 134*  K 3.3* 4.1 3.8 3.7  CL 103 106 103 98  CO2 24 24 26 25   GLUCOSE 136* 162* 118* 158*  BUN 12 9 <5* 6  CREATININE 0.68 0.65 0.61 0.65  CALCIUM 8.7* 8.7* 8.6* 9.0  MG 1.5* 1.7  --   --   PHOS 3.2 4.5  --   --    Liver Function Tests: Recent Labs  Lab 04/09/19 0148 04/12/19 1020  AST 60* 56*  ALT 54* 55*  ALKPHOS 88 102  BILITOT 0.7 0.7  PROT 6.6 6.7  ALBUMIN 2.5* 2.7*   No results for input(s): LIPASE, AMYLASE in the last 168 hours. No results for input(s): AMMONIA in the last 168 hours. CBC: Recent Labs  Lab 04/09/19 0148 04/10/19 0652 04/10/19 1122 04/12/19 1020 04/15/19 0305  WBC 10.1 5.9 5.4 7.1 6.8  NEUTROABS  --   --  3.1  --   --   HGB 7.4* 7.8* 8.3* 8.3* 9.2*  HCT 21.9* 24.5* 25.4* 25.2* 27.7*  MCV 89.0 92.5 92.0 89.4 89.1  PLT 459* 473* 543* 563* 459*   Cardiac Enzymes: No results for input(s): CKTOTAL, CKMB, CKMBINDEX,  TROPONINI in the last 168 hours. BNP: BNP (last 3 results) No results for input(s): BNP in the last 8760 hours.  ProBNP (last 3 results) No results for input(s): PROBNP in the last 8760 hours.  CBG: Recent Labs  Lab 04/10/19 1610 04/10/19 2049 04/12/19 2159 04/13/19 2200 04/14/19 2027  GLUCAP 155* 192* 100* 135* 115*       Signed:  Yousra Ivens MD.  [facility] 04/15/2019, 2:24 PM

## 2019-04-15 NOTE — Plan of Care (Signed)
Pt discharged

## 2019-04-15 NOTE — Plan of Care (Signed)

## 2019-04-15 NOTE — Progress Notes (Signed)
Pt alert. Able to make needs known. No acute distress this am. Pt stated that he has not vomited since yesterday am. Stated that the reason he did throw up was because the food here was gross. Pt has requested to discharge. MD made aware. Awaiting orders.

## 2019-05-16 ENCOUNTER — Encounter (HOSPITAL_COMMUNITY): Payer: Self-pay

## 2019-05-16 ENCOUNTER — Inpatient Hospital Stay (HOSPITAL_COMMUNITY)
Admission: EM | Admit: 2019-05-16 | Discharge: 2019-05-19 | DRG: 481 | Disposition: A | Discharge status: Home / Self Care | Payer: Self-pay | Attending: Internal Medicine | Admitting: Internal Medicine

## 2019-05-16 ENCOUNTER — Emergency Department (HOSPITAL_COMMUNITY): Payer: Self-pay

## 2019-05-16 ENCOUNTER — Other Ambulatory Visit: Payer: Self-pay

## 2019-05-16 DIAGNOSIS — T148XXA Other injury of unspecified body region, initial encounter: Secondary | ICD-10-CM

## 2019-05-16 DIAGNOSIS — Z79899 Other long term (current) drug therapy: Secondary | ICD-10-CM

## 2019-05-16 DIAGNOSIS — E1165 Type 2 diabetes mellitus with hyperglycemia: Secondary | ICD-10-CM | POA: Diagnosis not present

## 2019-05-16 DIAGNOSIS — F17210 Nicotine dependence, cigarettes, uncomplicated: Secondary | ICD-10-CM | POA: Diagnosis present

## 2019-05-16 DIAGNOSIS — Z20828 Contact with and (suspected) exposure to other viral communicable diseases: Secondary | ICD-10-CM | POA: Diagnosis present

## 2019-05-16 DIAGNOSIS — M10061 Idiopathic gout, right knee: Secondary | ICD-10-CM | POA: Diagnosis present

## 2019-05-16 DIAGNOSIS — S7291XA Unspecified fracture of right femur, initial encounter for closed fracture: Secondary | ICD-10-CM | POA: Diagnosis present

## 2019-05-16 DIAGNOSIS — E559 Vitamin D deficiency, unspecified: Secondary | ICD-10-CM | POA: Diagnosis present

## 2019-05-16 DIAGNOSIS — I10 Essential (primary) hypertension: Secondary | ICD-10-CM | POA: Diagnosis present

## 2019-05-16 DIAGNOSIS — W010XXA Fall on same level from slipping, tripping and stumbling without subsequent striking against object, initial encounter: Secondary | ICD-10-CM | POA: Diagnosis present

## 2019-05-16 DIAGNOSIS — Y92009 Unspecified place in unspecified non-institutional (private) residence as the place of occurrence of the external cause: Secondary | ICD-10-CM

## 2019-05-16 DIAGNOSIS — M109 Gout, unspecified: Secondary | ICD-10-CM | POA: Diagnosis present

## 2019-05-16 DIAGNOSIS — E1169 Type 2 diabetes mellitus with other specified complication: Secondary | ICD-10-CM | POA: Diagnosis present

## 2019-05-16 DIAGNOSIS — Z7289 Other problems related to lifestyle: Secondary | ICD-10-CM | POA: Insufficient documentation

## 2019-05-16 DIAGNOSIS — M069 Rheumatoid arthritis, unspecified: Secondary | ICD-10-CM | POA: Diagnosis present

## 2019-05-16 DIAGNOSIS — D62 Acute posthemorrhagic anemia: Secondary | ICD-10-CM | POA: Diagnosis not present

## 2019-05-16 DIAGNOSIS — M978XXA Periprosthetic fracture around other internal prosthetic joint, initial encounter: Secondary | ICD-10-CM

## 2019-05-16 DIAGNOSIS — E785 Hyperlipidemia, unspecified: Secondary | ICD-10-CM | POA: Diagnosis present

## 2019-05-16 DIAGNOSIS — Z9119 Patient's noncompliance with other medical treatment and regimen: Secondary | ICD-10-CM

## 2019-05-16 DIAGNOSIS — S72301A Unspecified fracture of shaft of right femur, initial encounter for closed fracture: Principal | ICD-10-CM | POA: Diagnosis present

## 2019-05-16 DIAGNOSIS — Z419 Encounter for procedure for purposes other than remedying health state, unspecified: Secondary | ICD-10-CM

## 2019-05-16 DIAGNOSIS — Z96649 Presence of unspecified artificial hip joint: Secondary | ICD-10-CM

## 2019-05-16 DIAGNOSIS — M9701XA Periprosthetic fracture around internal prosthetic right hip joint, initial encounter: Secondary | ICD-10-CM | POA: Diagnosis present

## 2019-05-16 DIAGNOSIS — Z79890 Hormone replacement therapy (postmenopausal): Secondary | ICD-10-CM

## 2019-05-16 DIAGNOSIS — Z789 Other specified health status: Secondary | ICD-10-CM | POA: Insufficient documentation

## 2019-05-16 DIAGNOSIS — E291 Testicular hypofunction: Secondary | ICD-10-CM | POA: Diagnosis present

## 2019-05-16 DIAGNOSIS — M25461 Effusion, right knee: Secondary | ICD-10-CM | POA: Diagnosis present

## 2019-05-16 DIAGNOSIS — F10229 Alcohol dependence with intoxication, unspecified: Secondary | ICD-10-CM | POA: Diagnosis present

## 2019-05-16 DIAGNOSIS — F1210 Cannabis abuse, uncomplicated: Secondary | ICD-10-CM | POA: Diagnosis present

## 2019-05-16 LAB — BASIC METABOLIC PANEL
Anion gap: 20 — ABNORMAL HIGH (ref 5–15)
BUN: 5 mg/dL — ABNORMAL LOW (ref 6–20)
CO2: 19 mmol/L — ABNORMAL LOW (ref 22–32)
Calcium: 8.9 mg/dL (ref 8.9–10.3)
Chloride: 100 mmol/L (ref 98–111)
Creatinine, Ser: 0.9 mg/dL (ref 0.61–1.24)
GFR calc Af Amer: >60 mL/min (ref >60)
GFR calc non Af Amer: >60 mL/min (ref >60)
Glucose, Bld: 113 mg/dL — ABNORMAL HIGH (ref 70–99)
Potassium: 3.5 mmol/L (ref 3.5–5.1)
Sodium: 139 mmol/L (ref 135–145)

## 2019-05-16 LAB — CBC WITH DIFFERENTIAL/PLATELET
Abs Immature Granulocytes: 0.01 10*3/uL (ref 0.00–0.07)
Basophils Absolute: 0.1 10*3/uL (ref 0.0–0.1)
Basophils Relative: 2 %
Eosinophils Absolute: 0 10*3/uL (ref 0.0–0.5)
Eosinophils Relative: 1 %
HCT: 36.8 % — ABNORMAL LOW (ref 39.0–52.0)
Hemoglobin: 11.5 g/dL — ABNORMAL LOW (ref 13.0–17.0)
Immature Granulocytes: 0 %
Lymphocytes Relative: 56 %
Lymphs Abs: 3.1 10*3/uL (ref 0.7–4.0)
MCH: 28.2 pg (ref 26.0–34.0)
MCHC: 31.3 g/dL (ref 30.0–36.0)
MCV: 90.2 fL (ref 80.0–100.0)
Monocytes Absolute: 0.3 10*3/uL (ref 0.1–1.0)
Monocytes Relative: 6 %
Neutro Abs: 1.9 10*3/uL (ref 1.7–7.7)
Neutrophils Relative %: 35 %
Platelets: 356 10*3/uL (ref 150–400)
RBC: 4.08 MIL/uL — ABNORMAL LOW (ref 4.22–5.81)
RDW: 15.5 % (ref 11.5–15.5)
WBC: 5.5 10*3/uL (ref 4.0–10.5)
nRBC: 0 % (ref 0.0–0.2)

## 2019-05-16 LAB — SARS CORONAVIRUS 2 BY RT PCR (HOSPITAL ORDER, PERFORMED IN ~~LOC~~ HOSPITAL LAB): SARS Coronavirus 2: NEGATIVE

## 2019-05-16 LAB — ETHANOL: Alcohol, Ethyl (B): 195 mg/dL — ABNORMAL HIGH (ref <10)

## 2019-05-16 MED ORDER — ONDANSETRON HCL 4 MG/2ML IJ SOLN: 4.0000 mg | Freq: Once | INTRAMUSCULAR | Status: DC

## 2019-05-16 MED ORDER — PROMETHAZINE HCL 25 MG/ML IJ SOLN
12.5000 mg | Freq: Once | INTRAMUSCULAR | Status: AC
  Administered 2019-05-16: 12.5 mg via INTRAVENOUS
  Filled 2019-05-16: qty 1

## 2019-05-16 MED ORDER — HYDROMORPHONE HCL 1 MG/ML IJ SOLN
1.0000 mg | Freq: Once | INTRAMUSCULAR | Status: AC
  Administered 2019-05-16: 1 mg via INTRAVENOUS
  Filled 2019-05-16: qty 1

## 2019-05-16 MED ORDER — ONDANSETRON HCL 4 MG/2ML IJ SOLN
4.0000 mg | Freq: Once | INTRAMUSCULAR | Status: AC
  Administered 2019-05-16: 4 mg via INTRAVENOUS
  Filled 2019-05-16: qty 2

## 2019-05-16 NOTE — ED Triage Notes (Signed)
Pt fell mechanically, with deformity to right leg, recent surgery (22ed may) to the right hip. Pain 10/10 with 234mc fent in ems.

## 2019-05-16 NOTE — ED Notes (Signed)
ED TO INPATIENT HANDOFF REPORT  ED Nurse Name and Phone #: [phone]  S Name/Age/Gender Richard Lopez 43 y.o. male Room/Bed: 029C/029C  Code Status   Code Status: Prior  Home/SNF/Other Home Patient oriented to: self, place, time and situation Is this baseline? Yes   Triage Complete: Triage complete  Chief Complaint No admission diagnoses are documented for this encounter.  Triage Note Pt fell mechanically, with deformity to right leg, recent surgery (22ed may) to the right hip. Pain 10/10 with 264mc fent in ems.    Allergies No Known Allergies  Level of Care/Admitting Diagnosis ED Disposition    ED Disposition Condition Comment   Admit  The patient appears reasonably stabilized for admission considering the current resources, flow, and capabilities available in the ED at this time, and I doubt any other [facility] requiring further screening and/or treatment in the ED prior to admission is  present.       B Medical/Surgery History Past Medical History:  Diagnosis Date  . Anxiety   . 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 Depression   . Diabetes mellitus    diet controlled  . Essential hypertension   . Gastritis   . Gout    Past Surgical History:  Procedure Laterality Date  . EXTERNAL FIXATION LEG Left 11/07/2018   Procedure: EXTERNAL FIXATION LEFT LOWER LEG;  Surgeon: Rod Can, MD;  Location: [facility];  Service: Orthopedics;  Laterality: Left;  . EXTERNAL FIXATION REMOVAL Left 11/08/2018   Procedure: REMOVAL EXTERNAL FIXATION LEG;  Surgeon: Shona Needles, MD;  Location: [place];  Service: Orthopedics;  Laterality: Left;  . INTRAMEDULLARY (IM) NAIL INTERTROCHANTERIC Right 03/26/2019   Procedure: INTRAMEDULLARY (IM) NAIL INTERTROCHANTRIC;  Surgeon: Shona Needles, MD;  Location: [place];  Service: Orthopedics;  Laterality: Right;  . NO PAST SURGERIES    . OPEN REDUCTION INTERNAL FIXATION (ORIF) TIBIA/FIBULA FRACTURE Left 11/08/2018   Procedure: OPEN REDUCTION INTERNAL  FIXATION (ORIF) TIBIA/FIBULA FRACTURE;  Surgeon: Shona Needles, MD;  Location: [place];  Service: Orthopedics;  Laterality: Left;     A IV Location/Drains/Wounds Patient Lines/Drains/Airways Status   Active Line/Drains/Airways    Name:   Placement date:   Placement time:   Site:   Days:   Peripheral IV 05/16/19 Anterior;Left Forearm   05/16/19    1946    Forearm   less than 1          Intake/Output Last 24 hours No intake or output data in the 24 hours ending 05/16/19 2202  Labs/Imaging Results for orders placed or performed during the hospital encounter of 05/16/19 (from the past 48 hour(s))  CBC with Differential/Platelet     Status: Abnormal   Collection Time: 05/16/19  8:02 PM  Result Value Ref Range   WBC 5.5 4.0 - 10.5 K/uL   RBC 4.08 (L) 4.22 - 5.81 MIL/uL   Hemoglobin 11.5 (L) 13.0 - 17.0 g/dL   HCT 36.8 (L) 39.0 - 52.0 %   MCV 90.2 80.0 - 100.0 fL   MCH 28.2 26.0 - 34.0 pg   MCHC 31.3 30.0 - 36.0 g/dL   RDW 15.5 11.5 - 15.5 %   Platelets 356 150 - 400 K/uL   nRBC 0.0 0.0 - 0.2 %   Neutrophils Relative % 35 %   Neutro Abs 1.9 1.7 - 7.7 K/uL   Lymphocytes Relative 56 %   Lymphs Abs 3.1 0.7 - 4.0 K/uL   Monocytes Relative 6 %  Monocytes Absolute 0.3 0.1 - 1.0 K/uL   Eosinophils Relative 1 %   Eosinophils Absolute 0.0 0.0 - 0.5 K/uL   Basophils Relative 2 %   Basophils Absolute 0.1 0.0 - 0.1 K/uL   Immature Granulocytes 0 %   Abs Immature Granulocytes 0.01 0.00 - 0.07 K/uL    Comment: Performed at [facility], [address] [place], [place] [address]  Basic metabolic panel     Status: Abnormal   Collection Time: 05/16/19  8:02 PM  Result Value Ref Range   Sodium 139 135 - 145 mmol/L   Potassium 3.5 3.5 - 5.1 mmol/L   Chloride 100 98 - 111 mmol/L   CO2 19 (L) 22 - 32 mmol/L   Glucose, Bld 113 (H) 70 - 99 mg/dL   BUN 5 (L) 6 - 20 mg/dL   Creatinine, Ser 9.48 0.61 - 1.24 mg/dL   Calcium 8.9 8.9 - 54.6 mg/dL   GFR calc non Af Amer >60 >60 mL/min    GFR calc Af Amer >60 >60 mL/min   Anion gap 20 (H) 5 - 15    Comment: Performed at [facility], [address], [place], [place] [address]   Dg Pelvis Portable  Result Date: 05/16/2019 CLINICAL DATA:  Fall history of IM nail right femur EXAM: PORTABLE PELVIS 1-2 VIEWS COMPARISON:  03/25/2019, CT 04/12/2019 FINDINGS: Pubic symphysis and rami are intact. Mild arthritis left hip. Left femoral head projects in joint. Incompletely visualized intramedullary rod and fixating screws in the proximal right femur. Prior intertrochanteric fracture with callus about the fracture site. IMPRESSION: 1. Prior intramedullary rod and screw fixation of proximal right femur for intertrochanteric and subtrochanteric fracture, incompletely visualized. 2. No acute osseous abnormality of the left hip Electronically Signed   By: Jasmine Pang M.D.   On: 05/16/2019 21:14   Dg Chest Port 1 View  Result Date: 05/16/2019 CLINICAL DATA:  Preop, fall EXAM: PORTABLE CHEST 1 VIEW COMPARISON:  04/09/2019 FINDINGS: No focal airspace disease or effusion. Normal cardiomediastinal silhouette. No pneumothorax. IMPRESSION: No active disease. Electronically Signed   By: Jasmine Pang M.D.   On: 05/16/2019 21:10   Dg Femur Portable Min 2 Views Right  Result Date: 05/16/2019 CLINICAL DATA:  Fall EXAM: RIGHT FEMUR PORTABLE 2 VIEW COMPARISON:  CT 04/12/2019, 04/11/2019, 04/10/2019, femur radiograph 03/25/2019 FINDINGS: Vascular calcifications within the soft tissues. Prior intramedullary rod a directed screws within the right femur for intertrochanteric and subtrochanteric fracture. Callus formation is noted. Acute oblique fracture proximal diaphysis of the femur, involves the distal stem of the femoral prosthesis. Mild varus angulation of distal fracture fragment and slightly greater than 1/2 bone with of lateral displacement of distal fracture fragment. IMPRESSION: 1. Acute displaced and slightly angulated periprosthetic fracture  involving the proximal shaft of the femur 2. Prior intramedullary rod and screw fixation of right femur for intertrochanteric fracture Electronically Signed   By: Jasmine Pang M.D.   On: 05/16/2019 21:17    Pending Labs Unresulted Labs (From admission, onward)    Start     Ordered   05/16/19 2028  Ethanol  Once,   STAT     05/16/19 2027   05/16/19 2018  SARS Coronavirus 2 (CEPHEID - Performed in [facility] hospital lab), Hosp Order  (Asymptomatic Patients Labs)  Once,   STAT    Question:  Rule Out  Answer:  Yes   05/16/19 2017          Vitals/Pain Today's Vitals  05/16/19 2115 05/16/19 2130 05/16/19 2135 05/16/19 2145  BP: (!) 165/101 (!) 167/100 (!) 167/101 (!) 162/101  Pulse: 100 95 97 100  Temp:      TempSrc:      SpO2: 99% 100% 100% 100%    Isolation Precautions No active isolations  Medications Medications  ondansetron (ZOFRAN) injection 4 mg (4 mg Intravenous Given 05/16/19 2003)  HYDROmorphone (DILAUDID) injection 1 mg (1 mg Intravenous Given 05/16/19 2109)  promethazine (PHENERGAN) injection 12.5 mg (12.5 mg Intravenous Given 05/16/19 2106)    Mobility non-ambulatory High fall risk   Focused Assessments   R Recommendations: See Admitting Provider Note  Report given to:   Additional Notes:

## 2019-05-16 NOTE — H&P (Signed)
History and Physical    Richard Lopez:[id number] DOB: [birth year] DOA: 05/16/2019  PCP: [name], NP  Patient coming from: Home  I have personally briefly reviewed patient's old medical records in [facility]  Chief Complaint: Right hip pain after a fall  HPI: Richard Lopez is a 43 y.o. male with medical history significant for hypertension, type 2 diabetes, alcohol use disorder with dependence and history of delirium tremens, and recent right hip fracture status post IM nail on 03/26/2019 who presents to the ED for right hip pain after a fall.  Patient states he was at home and went to sit down however lost balance and fell landing on his right hip.  He had immediate pain and was unable to get up.  History is otherwise limited due to active alcohol intoxication, however he denies any associated lightheadedness, dizziness, chest pain, dyspnea, or abdominal pain.  He has been having nausea and vomiting without hematemesis.  Of note patient was recently admitted from 04/07/2019-04/15/2019 for delirium tremens requiring ICU care with Precedex drip.  ED Course:  Initial vitals showed BP 171/112, pulse 102, RR 20, temp 97.6 Fahrenheit, SPO2 100% on room air.  Labs are notable for WBC 5.5, hemoglobin 11.5, platelets 356,000, sodium 139, potassium 3.5, bicarb 19, BUN 5, creatinine 0.9, serum glucose 113, serum ethanol 195.  SARS-CoV-2 test was negative.  Portable chest x-ray was negative for focal consolidation or effusion.    Portable pelvis x-ray showed incompletely visualized prior IM rod and screw fixation of the proximal right femur.  2 view right femur x-ray showed an acute displaced and slightly angulated periprosthetic fracture involving the proximal shaft of the femur and prior right IM rod and screw fixation of the right femur for intertrochanteric fracture.  Patient was given IV Dilaudid for pain and Phenergan for nausea.  EDP discussed the case with on-call orthopedics  who recommended hospitalist admission for potential surgery in the morning and Buck's traction of RLE.  CT right hip without contrast showed new displaced and overlapping proximal femoral shaft fracture at the level of the IM nail with distal fragment displaced anteriorly greater than 1 shaft width. Healing right femoral intertrochanteric and sub-trochanteric fractures with hardware in place.    Review of Systems:  Full review of systems limited due to alcohol intoxication.   Past Medical History:  Diagnosis Date  . Anxiety   . 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 Depression   . Diabetes mellitus    diet controlled  . Essential hypertension   . Gastritis   . Gout     Past Surgical History:  Procedure Laterality Date  . EXTERNAL FIXATION LEG Left 11/07/2018   Procedure: EXTERNAL FIXATION LEFT LOWER LEG;  Surgeon: Samson Frederic, MD;  Location: [facility];  Service: Orthopedics;  Laterality: Left;  . EXTERNAL FIXATION REMOVAL Left 11/08/2018   Procedure: REMOVAL EXTERNAL FIXATION LEG;  Surgeon: Roby Lofts, MD;  Location: [facility] OR;  Service: Orthopedics;  Laterality: Left;  . INTRAMEDULLARY (IM) NAIL INTERTROCHANTERIC Right 03/26/2019   Procedure: INTRAMEDULLARY (IM) NAIL INTERTROCHANTRIC;  Surgeon: Roby Lofts, MD;  Location: [facility] OR;  Service: Orthopedics;  Laterality: Right;  . NO PAST SURGERIES    . OPEN REDUCTION INTERNAL FIXATION (ORIF) TIBIA/FIBULA FRACTURE Left 11/08/2018   Procedure: OPEN REDUCTION INTERNAL FIXATION (ORIF) TIBIA/FIBULA FRACTURE;  Surgeon: Roby Lofts, MD;  Location: [facility] OR;  Service: Orthopedics;  Laterality: Left;    Social History:  reports  that he has been smoking cigarettes. He has been smoking about 0.00 packs per day. He has never used smokeless tobacco. He reports current alcohol use of about 200.0 standard drinks of alcohol per week. He reports current drug use. Drug: Marijuana.  No Known Allergies  Family History  Problem Relation Age of Onset   . Diabetes Mellitus II Father   . Diabetes Mellitus II Other   . CAD Other      Prior to Admission medications   Medication Sig Start Date End Date Taking? Authorizing Provider  amLODipine (NORVASC) 10 MG tablet Take 1 tablet (10 mg total) by mouth daily. 04/16/19   Susa GriffinsVasireddy, Padmaja, MD  cholecalciferol (VITAMIN D3) 25 MCG (1000 UT) tablet Take 2 tablets (2,000 Units total) by mouth 2 (two) times daily. 04/01/19   Zannie CoveJoseph, Preetha, MD  lisinopril (ZESTRIL) 10 MG tablet Take 1 tablet (10 mg total) by mouth daily. 04/02/19   Zannie CoveJoseph, Preetha, MD  Melatonin 5 MG CAPS Take 1 capsule (5 mg total) by mouth at bedtime as needed (sleep). 04/01/19   Zannie CoveJoseph, Preetha, MD  pantoprazole (PROTONIX) 40 MG tablet Take 1 tablet (40 mg total) by mouth 2 (two) times daily. 04/15/19 04/14/20  Susa GriffinsVasireddy, Padmaja, MD  senna-docusate (SENOKOT-S) 8.6-50 MG tablet Take 1 tablet by mouth at bedtime as needed for mild constipation. 04/01/19   Zannie CoveJoseph, Preetha, MD  thiamine 100 MG tablet Take 1 tablet (100 mg total) by mouth daily. 04/16/19   Susa GriffinsVasireddy, Padmaja, MD    Physical Exam: Vitals:   05/16/19 2200 05/16/19 2240 05/16/19 2305 05/16/19 2330  BP: (!) 172/102 (!) 176/59 (!) 171/106 (!) 175/102  Pulse:  97 95 96  Resp:   18 20  Temp:   98.2 F (36.8 C)   TempSrc:   Oral   SpO2:  100% 98% 99%    Constitutional: Resting supine in bed, lethargic, actively vomiting, appears intoxicated eyes: PERRL, lids and conjunctivae normal ENMT: Mucous membranes are dry.  Neck: normal, supple, no masses. Respiratory: clear to auscultation anteriorly.  Normal respiratory effort. No accessory muscle use.  Cardiovascular: Tachycardic, regular rhythm, no murmurs / rubs / gallops. No extremity edema. 2+ pedal pulses. Abdomen: no tenderness, no masses palpated. No hepatosplenomegaly. Bowel sounds positive.  Musculoskeletal: Right lower extremity in Buck's traction, painful on movement Skin: no rashes, lesions, ulcers. No induration  Neurologic: CN 2-12 grossly intact. Sensation intact,Strength 5/5 in all 4 except RLE due to hip pain.  Psychiatric: Lethargic and intoxicated, otherwise alert and oriented.   Labs on Admission: I have personally reviewed following labs and imaging studies  CBC: Recent Labs  Lab 05/16/19 2002  WBC 5.5  NEUTROABS 1.9  HGB 11.5*  HCT 36.8*  MCV 90.2  PLT 356   Basic Metabolic Panel: Recent Labs  Lab 05/16/19 2002  NA 139  K 3.5  CL 100  CO2 19*  GLUCOSE 113*  BUN 5*  CREATININE 0.90  CALCIUM 8.9   GFR: CrCl cannot be calculated (Unknown ideal weight.). Liver Function Tests: No results for input(s): AST, ALT, ALKPHOS, BILITOT, PROT, ALBUMIN in the last 168 hours. No results for input(s): LIPASE, AMYLASE in the last 168 hours. No results for input(s): AMMONIA in the last 168 hours. Coagulation Profile: No results for input(s): INR, PROTIME in the last 168 hours. Cardiac Enzymes: No results for input(s): CKTOTAL, CKMB, CKMBINDEX, TROPONINI in the last 168 hours. BNP (last 3 results) No results for input(s): PROBNP in the last 8760 hours. HbA1C: No results for  input(s): HGBA1C in the last 72 hours. CBG: No results for input(s): GLUCAP in the last 168 hours. Lipid Profile: No results for input(s): CHOL, HDL, LDLCALC, TRIG, CHOLHDL, LDLDIRECT in the last 72 hours. Thyroid Function Tests: No results for input(s): TSH, T4TOTAL, FREET4, T3FREE, THYROIDAB in the last 72 hours. Anemia Panel: No results for input(s): VITAMINB12, FOLATE, FERRITIN, TIBC, IRON, RETICCTPCT in the last 72 hours. Urine analysis:    Component Value Date/Time   COLORURINE YELLOW 04/09/2019 2103   APPEARANCEUR CLEAR 04/09/2019 2103   LABSPEC 1.004 (L) 04/09/2019 2103   PHURINE 7.0 04/09/2019 2103   GLUCOSEU NEGATIVE 04/09/2019 2103   HGBUR NEGATIVE 04/09/2019 2103   BILIRUBINUR NEGATIVE 04/09/2019 2103   KETONESUR NEGATIVE 04/09/2019 2103   PROTEINUR NEGATIVE 04/09/2019 2103   UROBILINOGEN  1.0 07/29/2014 1837   NITRITE NEGATIVE 04/09/2019 2103   LEUKOCYTESUR NEGATIVE 04/09/2019 2103    Radiological Exams on Admission: Dg Pelvis Portable  Result Date: 05/16/2019 CLINICAL DATA:  Fall history of IM nail right femur EXAM: PORTABLE PELVIS 1-2 VIEWS COMPARISON:  03/25/2019, CT 04/12/2019 FINDINGS: Pubic symphysis and rami are intact. Mild arthritis left hip. Left femoral head projects in joint. Incompletely visualized intramedullary rod and fixating screws in the proximal right femur. Prior intertrochanteric fracture with callus about the fracture site. IMPRESSION: 1. Prior intramedullary rod and screw fixation of proximal right femur for intertrochanteric and subtrochanteric fracture, incompletely visualized. 2. No acute osseous abnormality of the left hip Electronically Signed   By: Donavan Foil M.D.   On: 05/16/2019 21:14   Ct Hip Right Wo Contrast  Result Date: 05/16/2019 CLINICAL DATA: Recent fracture with internal fixation.  New fall. EXAM: CT OF THE RIGHT HIP WITHOUT CONTRAST TECHNIQUE: Multidetector CT imaging of the right hip was performed according to the standard protocol. Multiplanar CT image reconstructions were also generated. COMPARISON:  Plain films April 10, 2019.  CT April 11, 2019. FINDINGS: Previously seen fracture through the right femur intertrochanteric and subtrochanteric regions are again noted. Femoral neck screws and intramedullary rod noted in place. There is extensive callus formation but fracture line remains evident. In addition, there is a new displaced fracture through the proximal femoral shaft at the level of the intramedullary nail. The distal fragment is displaced anteriorly greater than 1 shaft width with overlapping fracture fragments. No subluxation or dislocation. IMPRESSION: Healing right femoral intertrochanteric and subtrochanteric fractures with hardware in place. Extensive callus formation noted, but fracture line remains evident. New displaced and  overlapping proximal femoral shaft fracture at the level of the intramedullary nail. Distal fragment is displaced anteriorly greater than 1 shaft width. Electronically Signed   By: Rolm Baptise M.D.   On: 05/16/2019 23:06   Dg Chest Port 1 View  Result Date: 05/16/2019 CLINICAL DATA:  Preop, fall EXAM: PORTABLE CHEST 1 VIEW COMPARISON:  04/09/2019 FINDINGS: No focal airspace disease or effusion. Normal cardiomediastinal silhouette. No pneumothorax. IMPRESSION: No active disease. Electronically Signed   By: Donavan Foil M.D.   On: 05/16/2019 21:10   Dg Femur Portable Min 2 Views Right  Result Date: 05/16/2019 CLINICAL DATA:  Fall EXAM: RIGHT FEMUR PORTABLE 2 VIEW COMPARISON:  CT 04/12/2019, 04/11/2019, 04/10/2019, femur radiograph 03/25/2019 FINDINGS: Vascular calcifications within the soft tissues. Prior intramedullary rod a directed screws within the right femur for intertrochanteric and subtrochanteric fracture. Callus formation is noted. Acute oblique fracture proximal diaphysis of the femur, involves the distal stem of the femoral prosthesis. Mild varus angulation of distal fracture fragment and slightly greater  than 1/2 bone with of lateral displacement of distal fracture fragment. IMPRESSION: 1. Acute displaced and slightly angulated periprosthetic fracture involving the proximal shaft of the femur 2. Prior intramedullary rod and screw fixation of right femur for intertrochanteric fracture Electronically Signed   By: Jasmine PangKim  Fujinaga M.D.   On: 05/16/2019 21:17    EKG: Not performed.  Assessment/Plan Principal Problem:   Peri-prosthetic fracture of femur at tip of prosthesis Active Problems:   Type 2 diabetes mellitus with hyperlipidemia (HCC)   Essential hypertension   Alcohol dependence with intoxication (HCC)  Richard Lopez is a 43 y.o. male with medical history significant for hypertension, type 2 diabetes, alcohol use disorder with dependence and history of delirium tremens, and recent  right hip fracture status post IM nail on 03/26/2019 who is admitted with periprosthetic fracture of the femur and acute alcohol intoxication.   Alcohol use disorder with intoxication and history of delirium tremens: Patient is intoxicated on admission with fall likely secondary to alcohol use.  He reports his last alcoholic beverage prior to ED arrival on 05/16/2019.  Has recent admission for DTs requiring Precedex in the ICU. -Admit to progressive care unit, patient high risk for alcohol withdrawal -CIWA monitoring with as needed Ativan -IV banana bag @ 150/hr over 12 hours -Antiemetics as needed  Periprosthetic fracture of the right femur: Appears to have occurred after mechanical fall, likely due to alcohol intoxication.  Orthopedics consulted, may need to delay surgical intervention pending resolution of acute alcohol intoxication. -Keep n.p.o., holding pharmacologic VTE prophylaxis -Orthopedics to see -Pain control regimen held for now due to active intoxication/sedation  Hypertension: Hypertensive on admission.  Patient apparently not taking home BP meds. -Resume home amlodipine and lisinopril -IV hydralazine as needed  Type 2 diabetes: A1c 5.4% on 04/01/2019.  Well controlled over the last 2 years.  Continue to monitor.  DVT prophylaxis: SCDs Code Status: Full code Family Communication: None present on admission Disposition Plan: Pending clinical progress, orthopedic intervention Consults called: Orthopedics Admission status: Admit - It is my clinical opinion that admission to INPATIENT is reasonable and necessary because of the expectation that this patient will require hospital care that crosses at least 2 midnights to treat this condition based on the medical complexity of the problems presented.  Given the aforementioned information, the predictability of an adverse outcome is felt to be significant.     Darreld McleanVishal Davette Nugent MD [facility]  If 7PM-7AM, please contact  night-coverage [email or web address]  05/17/2019, 12:03 AM

## 2019-05-16 NOTE — ED Notes (Signed)
Page ortho when pt goes upstairs so that they can get him comfortable.

## 2019-05-16 NOTE — ED Provider Notes (Signed)
[facility] EMERGENCY DEPARTMENT Provider Note   CSN: [id number] Arrival date & time: 05/16/19  1938    History   Chief Complaint Chief Complaint  Patient presents with   Leg Injury    HPI Richard Lopez is a 43 y.o. male.     HPI   Richard Lopez is a 43 y.o. male, with a history of alcohol abuse, DM, HTN, presenting to the ED with right upper leg injury that occurred around 645 this evening.  Patient was using crutches and sustained a mechanical fall, injuring the right upper leg.  He notes swelling and deformity to the region. Pain is throbbing, severe, radiating throughout the right hip and right upper leg.  Patient last drank alcohol around 11 AM today, consuming 40 ounce beer.  Marijuana use.  Denies other illicit drug use. Last food was yesterday evening.  Patient underwent intramedullary nail intertrochanteric fracture repair Mar 26, 2019, performed by Dr. Doreatha Martin.  Denies fever/chills, nausea/vomiting, recent illness, syncope, chest pain, shortness of breath, abdominal pain, numbness, weakness, or any other complaints.  Past Medical History:  Diagnosis Date   Anxiety    Arthritis    hands and possibly knee   Depression    Diabetes mellitus    diet controlled   Essential hypertension    Gastritis    Gout     Patient Active Problem List   Diagnosis Date Noted   Intertrochanteric fracture of right hip ([place]) 04/14/2019   Vitamin D deficiency 04/14/2019   DTs (delirium tremens) ([place]) 04/07/2019   Delirium tremens ([place]) 04/07/2019   Encephalopathy acute    Closed right hip fracture, initial encounter ([place]) 03/25/2019   Alcohol dependence with intoxication ([place]) 03/25/2019   Hypertensive urgency 03/25/2019   Thrombocytopenia ([place]) 03/25/2019   Effusion, right knee 03/25/2019   Closed fracture of right femur ([place])    Metabolic acidosis    Prediabetes 04/02/2017   Gastritis 04/02/2017   Marijuana abuse 04/01/2017    Nausea with vomiting 07/29/2014   Essential hypertension 07/29/2014   Nausea & vomiting 07/29/2014    Past Surgical History:  Procedure Laterality Date   EXTERNAL FIXATION LEG Left 11/07/2018   Procedure: EXTERNAL FIXATION LEFT LOWER LEG;  Surgeon: Rod Can, MD;  Location: [facility];  Service: Orthopedics;  Laterality: Left;   EXTERNAL FIXATION REMOVAL Left 11/08/2018   Procedure: REMOVAL EXTERNAL FIXATION LEG;  Surgeon: Shona Needles, MD;  Location: [facility];  Service: Orthopedics;  Laterality: Left;   INTRAMEDULLARY (IM) NAIL INTERTROCHANTERIC Right 03/26/2019   Procedure: INTRAMEDULLARY (IM) NAIL INTERTROCHANTRIC;  Surgeon: Shona Needles, MD;  Location: [facility];  Service: Orthopedics;  Laterality: Right;   NO PAST SURGERIES     OPEN REDUCTION INTERNAL FIXATION (ORIF) TIBIA/FIBULA FRACTURE Left 11/08/2018   Procedure: OPEN REDUCTION INTERNAL FIXATION (ORIF) TIBIA/FIBULA FRACTURE;  Surgeon: Shona Needles, MD;  Location: [facility];  Service: Orthopedics;  Laterality: Left;        Home Medications    Prior to Admission medications   Medication Sig Start Date End Date Taking? Authorizing Provider  amLODipine (NORVASC) 10 MG tablet Take 1 tablet (10 mg total) by mouth daily. 04/16/19   Monica Becton, MD  cholecalciferol (VITAMIN D3) 25 MCG (1000 UT) tablet Take 2 tablets (2,000 Units total) by mouth 2 (two) times daily. 04/01/19   Domenic Polite, MD  lisinopril (ZESTRIL) 10 MG tablet Take 1 tablet (10 mg total) by mouth daily. 04/02/19   Domenic Polite, MD  Melatonin  5 MG CAPS Take 1 capsule (5 mg total) by mouth at bedtime as needed (sleep). 04/01/19   Zannie Cove, MD  pantoprazole (PROTONIX) 40 MG tablet Take 1 tablet (40 mg total) by mouth 2 (two) times daily. 04/15/19 04/14/20  Susa Griffins, MD  senna-docusate (SENOKOT-S) 8.6-50 MG tablet Take 1 tablet by mouth at bedtime as needed for mild constipation. 04/01/19   Zannie Cove, MD  thiamine 100 MG tablet Take 1 tablet  (100 mg total) by mouth daily. 04/16/19   Susa Griffins, MD    Family History Family History  Problem Relation Age of Onset   Diabetes Mellitus II Father    Diabetes Mellitus II Other    CAD Other     Social History Social History   Tobacco Use   Smoking status: Current Every Day Smoker    Packs/day: 0.00    Types: Cigarettes   Smokeless tobacco: Never Used   Tobacco comment: About 1 cigarette per day or less  Substance Use Topics   Alcohol use: Yes    Alcohol/week: 200.0 standard drinks    Types: 200 Cans of beer per week    Comment: 2-3 40oz beers per week   Drug use: Yes    Types: Marijuana    Comment: 3 times a week     Allergies   Patient has no known allergies.   Review of Systems Review of Systems  Constitutional: Negative for chills, diaphoresis and fever.  Respiratory: Negative for shortness of breath.   Cardiovascular: Negative for chest pain.  Gastrointestinal: Negative for abdominal pain, nausea and vomiting.  Musculoskeletal: Positive for arthralgias.  Neurological: Negative for numbness.  All other systems reviewed and are negative.    Physical Exam Updated Vital Signs BP (!) 170/110    Pulse 92    Temp 97.6 F (36.4 Lopez) (Oral)    SpO2 98%   Physical Exam Vitals signs and nursing note reviewed.  Constitutional:      General: He is not in acute distress.    Appearance: He is well-developed. He is not diaphoretic.  HENT:     Head: Normocephalic and atraumatic.     Mouth/Throat:     Mouth: Mucous membranes are moist.     Pharynx: Oropharynx is clear.  Eyes:     Conjunctiva/sclera: Conjunctivae normal.  Neck:     Musculoskeletal: Neck supple.  Cardiovascular:     Rate and Rhythm: Normal rate and regular rhythm.     Pulses: Normal pulses.          Radial pulses are 2+ on the right side and 2+ on the left side.       Dorsalis pedis pulses are 2+ on the right side.       Posterior tibial pulses are 2+ on the right side and 2+ on  the left side.     Heart sounds: Normal heart sounds.     Comments: Tactile temperature in the extremities appropriate and equal bilaterally. Pulmonary:     Effort: Pulmonary effort is normal. No respiratory distress.     Breath sounds: Normal breath sounds.  Abdominal:     Palpations: Abdomen is soft.     Tenderness: There is no abdominal tenderness. There is no guarding.  Musculoskeletal:     Right hip: He exhibits tenderness and deformity.     Right upper leg: He exhibits tenderness, swelling and deformity.     Right lower leg: No edema.     Left lower leg: No edema.  Lymphadenopathy:     Cervical: No cervical adenopathy.  Skin:    General: Skin is warm and dry.  Neurological:     Mental Status: He is alert.     Comments: Sensation to light touch grossly intact along the right lower extremity. Distal motor function intact in the bilateral lower extremities.  Psychiatric:        Mood and Affect: Mood and affect normal.        Speech: Speech normal.        Behavior: Behavior normal.      ED Treatments / Results  Labs (all labs ordered are listed, but only abnormal results are displayed) Labs Reviewed  CBC WITH DIFFERENTIAL/PLATELET - Abnormal; Notable for the following components:      Result Value   RBC 4.08 (*)    Hemoglobin 11.5 (*)    HCT 36.8 (*)    All other components within normal limits  BASIC METABOLIC PANEL - Abnormal; Notable for the following components:   CO2 19 (*)    Glucose, Bld 113 (*)    BUN 5 (*)    Anion gap 20 (*)    All other components within normal limits  SARS CORONAVIRUS 2 (HOSPITAL ORDER, PERFORMED IN [facility])  ETHANOL    EKG None  Radiology Dg Pelvis Portable  Result Date: 05/16/2019 CLINICAL DATA:  Fall history of IM nail right femur EXAM: PORTABLE PELVIS 1-2 VIEWS COMPARISON:  03/25/2019, CT 04/12/2019 FINDINGS: Pubic symphysis and rami are intact. Mild arthritis left hip. Left femoral head projects in joint.  Incompletely visualized intramedullary rod and fixating screws in the proximal right femur. Prior intertrochanteric fracture with callus about the fracture site. IMPRESSION: 1. Prior intramedullary rod and screw fixation of proximal right femur for intertrochanteric and subtrochanteric fracture, incompletely visualized. 2. No acute osseous abnormality of the left hip Electronically Signed   By: Jasmine PangKim  Fujinaga M.D.   On: 05/16/2019 21:14   Dg Chest Port 1 View  Result Date: 05/16/2019 CLINICAL DATA:  Preop, fall EXAM: PORTABLE CHEST 1 VIEW COMPARISON:  04/09/2019 FINDINGS: No focal airspace disease or effusion. Normal cardiomediastinal silhouette. No pneumothorax. IMPRESSION: No active disease. Electronically Signed   By: Jasmine PangKim  Fujinaga M.D.   On: 05/16/2019 21:10   Dg Femur Portable Min 2 Views Right  Result Date: 05/16/2019 CLINICAL DATA:  Fall EXAM: RIGHT FEMUR PORTABLE 2 VIEW COMPARISON:  CT 04/12/2019, 04/11/2019, 04/10/2019, femur radiograph 03/25/2019 FINDINGS: Vascular calcifications within the soft tissues. Prior intramedullary rod a directed screws within the right femur for intertrochanteric and subtrochanteric fracture. Callus formation is noted. Acute oblique fracture proximal diaphysis of the femur, involves the distal stem of the femoral prosthesis. Mild varus angulation of distal fracture fragment and slightly greater than 1/2 bone with of lateral displacement of distal fracture fragment. IMPRESSION: 1. Acute displaced and slightly angulated periprosthetic fracture involving the proximal shaft of the femur 2. Prior intramedullary rod and screw fixation of right femur for intertrochanteric fracture Electronically Signed   By: Jasmine PangKim  Fujinaga M.D.   On: 05/16/2019 21:17    Procedures Procedures (including critical care time)  Medications Ordered in ED Medications  ondansetron (ZOFRAN) injection 4 mg (4 mg Intravenous Given 05/16/19 2003)  HYDROmorphone (DILAUDID) injection 1 mg (1 mg  Intravenous Given 05/16/19 2109)  promethazine (PHENERGAN) injection 12.5 mg (12.5 mg Intravenous Given 05/16/19 2106)     Initial Impression / Assessment and Plan / ED Course  I have reviewed the triage vital signs and  the nursing notes.  Pertinent labs & imaging results that were available during my care of the patient were reviewed by me and considered in my medical decision making (see chart for details).  Clinical Course as of May 15 2199  Mon May 16, 2019  2125 Spoke with Tiffany with Dr. Luvenia StarchHaddix's answering service. States she will page physician on call.    [SJ]  2130 Spoke with Dr. Eulah PontMurphy, on call for Dr. Luvenia StarchHaddix's practice.  Admit via hospitalist service.  N.p.o. at midnight. Plan for surgery in morning. Have [employer] place patient in Buck's traction.   [SJ]  2140 Discussed imaging findings and plan with the patient.  Patient appears much more comfortable with eyes closed, but easily arousable.   [SJ]  2155 Spoke with Dr. Allena KatzPatel, hospitalist.  Agrees to admit the patient.   [SJ]    Clinical Course User Index [SJ] Richard Darrough C, PA-Lopez       Patient presents with right leg injury following mechanical fall.  Periprosthetic fracture of the right femur.  No evidence of distal neurovascular compromise.  Will be managed by orthopedics with surgical repair in the morning.  Admitted via hospitalist.    Findings and plan of care discussed with Richard Mallingavid Yao, MD. Dr. Silverio LayYao personally evaluated and examined this patient.  Final Clinical Impressions(s) / ED Diagnoses   Final diagnoses:  Periprosthetic fracture of femur at tip of prosthesis, initial encounter    ED Discharge Orders    None       Richard Lopez, Richard Bergerson C, PA-Lopez 05/16/19 2201    Charlynne PanderYao, Richard Hsienta, MD 05/18/19 1623

## 2019-05-17 ENCOUNTER — Encounter (HOSPITAL_COMMUNITY)
Admission: EM | Disposition: A | Discharge status: Home / Self Care | Payer: Self-pay | Source: Home / Self Care | Attending: Internal Medicine

## 2019-05-17 ENCOUNTER — Encounter (HOSPITAL_COMMUNITY): Payer: Self-pay | Admitting: Certified Registered"

## 2019-05-17 ENCOUNTER — Inpatient Hospital Stay (HOSPITAL_COMMUNITY): Payer: Self-pay

## 2019-05-17 ENCOUNTER — Inpatient Hospital Stay (HOSPITAL_COMMUNITY): Payer: Self-pay | Admitting: Certified Registered"

## 2019-05-17 DIAGNOSIS — F10229 Alcohol dependence with intoxication, unspecified: Secondary | ICD-10-CM

## 2019-05-17 DIAGNOSIS — I10 Essential (primary) hypertension: Secondary | ICD-10-CM

## 2019-05-17 DIAGNOSIS — E1169 Type 2 diabetes mellitus with other specified complication: Secondary | ICD-10-CM

## 2019-05-17 DIAGNOSIS — E785 Hyperlipidemia, unspecified: Secondary | ICD-10-CM

## 2019-05-17 HISTORY — PX: ORIF FEMUR FRACTURE: SHX2119

## 2019-05-17 HISTORY — PX: INTRAMEDULLARY (IM) NAIL INTERTROCHANTERIC: SHX5875

## 2019-05-17 LAB — BASIC METABOLIC PANEL
Anion gap: 17 — ABNORMAL HIGH (ref 5–15)
BUN: 5 mg/dL — ABNORMAL LOW (ref 6–20)
CO2: 21 mmol/L — ABNORMAL LOW (ref 22–32)
Calcium: 8.8 mg/dL — ABNORMAL LOW (ref 8.9–10.3)
Chloride: 102 mmol/L (ref 98–111)
Creatinine, Ser: 0.82 mg/dL (ref 0.61–1.24)
GFR calc Af Amer: >60 mL/min (ref >60)
GFR calc non Af Amer: >60 mL/min (ref >60)
Glucose, Bld: 123 mg/dL — ABNORMAL HIGH (ref 70–99)
Potassium: 4 mmol/L (ref 3.5–5.1)
Sodium: 140 mmol/L (ref 135–145)

## 2019-05-17 LAB — CBC
HCT: 32.3 % — ABNORMAL LOW (ref 39.0–52.0)
HCT: 32.2 % — ABNORMAL LOW (ref 39.0–52.0)
Hemoglobin: 11 g/dL — ABNORMAL LOW (ref 13.0–17.0)
Hemoglobin: 10.7 g/dL — ABNORMAL LOW (ref 13.0–17.0)
MCH: 28.9 pg (ref 26.0–34.0)
MCH: 28.7 pg (ref 26.0–34.0)
MCHC: 34.1 g/dL (ref 30.0–36.0)
MCHC: 33.2 g/dL (ref 30.0–36.0)
MCV: 84.8 fL (ref 80.0–100.0)
MCV: 86.3 fL (ref 80.0–100.0)
Platelets: 312 10*3/uL (ref 150–400)
Platelets: 252 10*3/uL (ref 150–400)
RBC: 3.81 MIL/uL — ABNORMAL LOW (ref 4.22–5.81)
RBC: 3.73 MIL/uL — ABNORMAL LOW (ref 4.22–5.81)
RDW: 15.1 % (ref 11.5–15.5)
RDW: 15.1 % (ref 11.5–15.5)
WBC: 6.1 10*3/uL (ref 4.0–10.5)
WBC: 9.1 10*3/uL (ref 4.0–10.5)
nRBC: 0 % (ref 0.0–0.2)
nRBC: 0 % (ref 0.0–0.2)

## 2019-05-17 LAB — SURGICAL PCR SCREEN
MRSA, PCR: NEGATIVE
Staphylococcus aureus: NEGATIVE

## 2019-05-17 LAB — SYNOVIAL CELL COUNT + DIFF, W/ CRYSTALS
Eosinophils-Synovial: 0 % (ref 0–1)
Lymphocytes-Synovial Fld: 25 % — ABNORMAL HIGH (ref 0–20)
Monocyte-Macrophage-Synovial Fluid: 58 % (ref 50–90)
Neutrophil, Synovial: 17 % (ref 0–25)
WBC, Synovial: 128 /mm3 (ref 0–200)

## 2019-05-17 LAB — GLUCOSE, CAPILLARY
Glucose-Capillary: 146 mg/dL — ABNORMAL HIGH (ref 70–99)
Glucose-Capillary: 175 mg/dL — ABNORMAL HIGH (ref 70–99)

## 2019-05-17 LAB — CREATININE, SERUM
Creatinine, Ser: 1 mg/dL (ref 0.61–1.24)
GFR calc Af Amer: >60 mL/min (ref >60)
GFR calc non Af Amer: >60 mL/min (ref >60)

## 2019-05-17 LAB — MAGNESIUM: Magnesium: 1.2 mg/dL — ABNORMAL LOW (ref 1.7–2.4)

## 2019-05-17 SURGERY — OPEN REDUCTION INTERNAL FIXATION (ORIF) DISTAL FEMUR FRACTURE
Anesthesia: General | Site: Leg Upper | Laterality: Right

## 2019-05-17 MED ORDER — FENTANYL CITRATE (PF) 100 MCG/2ML IJ SOLN
INTRAMUSCULAR | Status: DC | PRN
  Administered 2019-05-17: 100 ug via INTRAVENOUS
  Administered 2019-05-17: 150 ug via INTRAVENOUS
  Administered 2019-05-17 (×3): 50 ug via INTRAVENOUS
  Administered 2019-05-17: 100 ug via INTRAVENOUS

## 2019-05-17 MED ORDER — FENTANYL CITRATE (PF) 100 MCG/2ML IJ SOLN
25.0000 ug | INTRAMUSCULAR | Status: DC | PRN
  Administered 2019-05-17 (×2): 50 ug via INTRAVENOUS

## 2019-05-17 MED ORDER — ONDANSETRON HCL 4 MG PO TABS: 4.0000 mg | ORAL_TABLET | Freq: Four times a day (QID) | ORAL | Status: DC | PRN

## 2019-05-17 MED ORDER — PANTOPRAZOLE SODIUM 40 MG PO TBEC
40.0000 mg | DELAYED_RELEASE_TABLET | Freq: Two times a day (BID) | ORAL | Status: DC
  Administered 2019-05-17 – 2019-05-19 (×4): 40 mg via ORAL
  Filled 2019-05-17 (×4): qty 1

## 2019-05-17 MED ORDER — ROCURONIUM BROMIDE 10 MG/ML (PF) SYRINGE
PREFILLED_SYRINGE | INTRAVENOUS | Status: AC
  Filled 2019-05-17: qty 10

## 2019-05-17 MED ORDER — ADULT MULTIVITAMIN W/MINERALS CH
1.0000 | ORAL_TABLET | Freq: Every day | ORAL | Status: DC
  Administered 2019-05-18 – 2019-05-19 (×2): 1 via ORAL
  Filled 2019-05-17 (×2): qty 1

## 2019-05-17 MED ORDER — ONDANSETRON HCL 4 MG/2ML IJ SOLN
INTRAMUSCULAR | Status: DC | PRN
  Administered 2019-05-17: 4 mg via INTRAVENOUS

## 2019-05-17 MED ORDER — LISINOPRIL 10 MG PO TABS
10.0000 mg | ORAL_TABLET | Freq: Every day | ORAL | Status: DC
  Administered 2019-05-18 – 2019-05-19 (×2): 10 mg via ORAL
  Filled 2019-05-17 (×2): qty 1

## 2019-05-17 MED ORDER — THIAMINE HCL 100 MG/ML IJ SOLN
INTRAVENOUS | Status: AC
  Administered 2019-05-17: 01:00:00 via INTRAVENOUS
  Filled 2019-05-17 (×6): qty 1000

## 2019-05-17 MED ORDER — MORPHINE SULFATE (PF) 2 MG/ML IV SOLN
2.0000 mg | INTRAVENOUS | Status: DC | PRN
  Administered 2019-05-17 – 2019-05-18 (×7): 2 mg via INTRAVENOUS
  Filled 2019-05-17 (×7): qty 1

## 2019-05-17 MED ORDER — PROPOFOL 10 MG/ML IV BOLUS
INTRAVENOUS | Status: DC | PRN
  Administered 2019-05-17: 200 mg via INTRAVENOUS

## 2019-05-17 MED ORDER — METOCLOPRAMIDE HCL 5 MG/ML IJ SOLN: 5.0000 mg | Freq: Three times a day (TID) | INTRAMUSCULAR | Status: DC | PRN

## 2019-05-17 MED ORDER — SUCCINYLCHOLINE CHLORIDE 200 MG/10ML IV SOSY
PREFILLED_SYRINGE | INTRAVENOUS | Status: AC
  Filled 2019-05-17: qty 10

## 2019-05-17 MED ORDER — ACETAMINOPHEN 650 MG RE SUPP: 650.0000 mg | Freq: Four times a day (QID) | RECTAL | Status: DC | PRN

## 2019-05-17 MED ORDER — SUGAMMADEX SODIUM 200 MG/2ML IV SOLN
INTRAVENOUS | Status: DC | PRN
  Administered 2019-05-17: 200 mg via INTRAVENOUS

## 2019-05-17 MED ORDER — ALBUMIN HUMAN 5 % IV SOLN
INTRAVENOUS | Status: DC | PRN
  Administered 2019-05-17 (×2): via INTRAVENOUS

## 2019-05-17 MED ORDER — OXYCODONE HCL 5 MG PO TABS
ORAL_TABLET | ORAL | Status: AC
  Administered 2019-05-17: 5 mg
  Filled 2019-05-17: qty 1

## 2019-05-17 MED ORDER — LORAZEPAM 2 MG/ML IJ SOLN: 2.0000 mg | INTRAMUSCULAR | Status: DC | PRN

## 2019-05-17 MED ORDER — ROCURONIUM BROMIDE 50 MG/5ML IV SOSY
PREFILLED_SYRINGE | INTRAVENOUS | Status: DC | PRN
  Administered 2019-05-17 (×2): 20 mg via INTRAVENOUS
  Administered 2019-05-17: 50 mg via INTRAVENOUS
  Administered 2019-05-17: 20 mg via INTRAVENOUS

## 2019-05-17 MED ORDER — LIDOCAINE 2% (20 MG/ML) 5 ML SYRINGE
INTRAMUSCULAR | Status: DC | PRN
  Administered 2019-05-17: 40 mg via INTRAVENOUS
  Administered 2019-05-17: 60 mg via INTRAVENOUS

## 2019-05-17 MED ORDER — FENTANYL CITRATE (PF) 250 MCG/5ML IJ SOLN
INTRAMUSCULAR | Status: AC
  Filled 2019-05-17: qty 5

## 2019-05-17 MED ORDER — DEXAMETHASONE SODIUM PHOSPHATE 10 MG/ML IJ SOLN
INTRAMUSCULAR | Status: DC | PRN
  Administered 2019-05-17: 8 mg via INTRAVENOUS

## 2019-05-17 MED ORDER — OXYCODONE HCL 5 MG PO TABS
5.0000 mg | ORAL_TABLET | Freq: Once | ORAL | Status: AC | PRN
  Administered 2019-05-17: 5 mg via ORAL

## 2019-05-17 MED ORDER — ONDANSETRON HCL 4 MG/2ML IJ SOLN
4.0000 mg | Freq: Four times a day (QID) | INTRAMUSCULAR | Status: DC | PRN
  Administered 2019-05-17: 4 mg via INTRAVENOUS
  Filled 2019-05-17 (×2): qty 2

## 2019-05-17 MED ORDER — MIDAZOLAM HCL 5 MG/5ML IJ SOLN
INTRAMUSCULAR | Status: DC | PRN
  Administered 2019-05-17: 2 mg via INTRAVENOUS

## 2019-05-17 MED ORDER — LIDOCAINE 2% (20 MG/ML) 5 ML SYRINGE
INTRAMUSCULAR | Status: AC
  Filled 2019-05-17: qty 5

## 2019-05-17 MED ORDER — SUCCINYLCHOLINE CHLORIDE 200 MG/10ML IV SOSY
PREFILLED_SYRINGE | INTRAVENOUS | Status: DC | PRN
  Administered 2019-05-17: 140 mg via INTRAVENOUS

## 2019-05-17 MED ORDER — CEFAZOLIN SODIUM-DEXTROSE 2-3 GM-%(50ML) IV SOLR
INTRAVENOUS | Status: DC | PRN
  Administered 2019-05-17: 2 g via INTRAVENOUS

## 2019-05-17 MED ORDER — ONDANSETRON HCL 4 MG/2ML IJ SOLN: 4.0000 mg | Freq: Four times a day (QID) | INTRAMUSCULAR | Status: DC | PRN

## 2019-05-17 MED ORDER — FOLIC ACID 1 MG PO TABS
1.0000 mg | ORAL_TABLET | Freq: Every day | ORAL | Status: DC
  Administered 2019-05-18 – 2019-05-19 (×2): 1 mg via ORAL
  Filled 2019-05-17 (×2): qty 1

## 2019-05-17 MED ORDER — PROPOFOL 10 MG/ML IV BOLUS
INTRAVENOUS | Status: AC
  Filled 2019-05-17: qty 20

## 2019-05-17 MED ORDER — SODIUM CHLORIDE 0.9% FLUSH
3.0000 mL | Freq: Two times a day (BID) | INTRAVENOUS | Status: DC
  Administered 2019-05-17 – 2019-05-19 (×4): 3 mL via INTRAVENOUS

## 2019-05-17 MED ORDER — DOCUSATE SODIUM 100 MG PO CAPS
100.0000 mg | ORAL_CAPSULE | Freq: Two times a day (BID) | ORAL | Status: DC
  Administered 2019-05-17 – 2019-05-19 (×5): 100 mg via ORAL
  Filled 2019-05-17 (×5): qty 1

## 2019-05-17 MED ORDER — 0.9 % SODIUM CHLORIDE (POUR BTL) OPTIME
TOPICAL | Status: DC | PRN
  Administered 2019-05-17: 1000 mL

## 2019-05-17 MED ORDER — ACETAMINOPHEN 325 MG PO TABS
650.0000 mg | ORAL_TABLET | Freq: Four times a day (QID) | ORAL | Status: DC | PRN
  Administered 2019-05-17 – 2019-05-18 (×3): 650 mg via ORAL
  Filled 2019-05-17 (×3): qty 2

## 2019-05-17 MED ORDER — PANTOPRAZOLE SODIUM 40 MG IV SOLR
40.0000 mg | Freq: Two times a day (BID) | INTRAVENOUS | Status: DC
  Administered 2019-05-17: 01:00:00 40 mg via INTRAVENOUS
  Filled 2019-05-17: qty 40

## 2019-05-17 MED ORDER — CEFAZOLIN SODIUM-DEXTROSE 2-4 GM/100ML-% IV SOLN
2.0000 g | Freq: Four times a day (QID) | INTRAVENOUS | Status: AC
  Administered 2019-05-17 – 2019-05-18 (×3): 2 g via INTRAVENOUS
  Filled 2019-05-17 (×3): qty 100

## 2019-05-17 MED ORDER — PHENYLEPHRINE 40 MCG/ML (10ML) SYRINGE FOR IV PUSH (FOR BLOOD PRESSURE SUPPORT)
PREFILLED_SYRINGE | INTRAVENOUS | Status: DC | PRN
  Administered 2019-05-17 (×3): 80 ug via INTRAVENOUS

## 2019-05-17 MED ORDER — LACTATED RINGERS IV SOLN: INTRAVENOUS | Status: DC

## 2019-05-17 MED ORDER — OXYCODONE HCL 5 MG/5ML PO SOLN: 5.0000 mg | Freq: Once | ORAL | Status: AC | PRN

## 2019-05-17 MED ORDER — DEXAMETHASONE SODIUM PHOSPHATE 10 MG/ML IJ SOLN
INTRAMUSCULAR | Status: AC
  Filled 2019-05-17: qty 1

## 2019-05-17 MED ORDER — ENOXAPARIN SODIUM 40 MG/0.4ML ~~LOC~~ SOLN
40.0000 mg | SUBCUTANEOUS | Status: DC
  Administered 2019-05-18 – 2019-05-19 (×2): 40 mg via SUBCUTANEOUS
  Filled 2019-05-17 (×2): qty 0.4

## 2019-05-17 MED ORDER — BUPIVACAINE HCL (PF) 0.25 % IJ SOLN
INTRAMUSCULAR | Status: AC
  Filled 2019-05-17: qty 30

## 2019-05-17 MED ORDER — LACTATED RINGERS IV SOLN
INTRAVENOUS | Status: DC | PRN
  Administered 2019-05-17 (×2): via INTRAVENOUS

## 2019-05-17 MED ORDER — OXYCODONE HCL 5 MG PO TABS
5.0000 mg | ORAL_TABLET | ORAL | Status: DC | PRN
  Administered 2019-05-17 – 2019-05-19 (×5): 5 mg via ORAL
  Filled 2019-05-17 (×7): qty 1

## 2019-05-17 MED ORDER — MIDAZOLAM HCL 2 MG/2ML IJ SOLN
INTRAMUSCULAR | Status: AC
  Filled 2019-05-17: qty 2

## 2019-05-17 MED ORDER — AMLODIPINE BESYLATE 10 MG PO TABS
10.0000 mg | ORAL_TABLET | Freq: Every day | ORAL | Status: DC
  Administered 2019-05-18 – 2019-05-19 (×2): 10 mg via ORAL
  Filled 2019-05-17 (×2): qty 1

## 2019-05-17 MED ORDER — SODIUM CHLORIDE 0.9 % IV SOLN
INTRAVENOUS | Status: DC | PRN
  Administered 2019-05-17: 60 ug/min via INTRAVENOUS

## 2019-05-17 MED ORDER — FENTANYL CITRATE (PF) 100 MCG/2ML IJ SOLN
INTRAMUSCULAR | Status: AC
  Filled 2019-05-17: qty 2

## 2019-05-17 MED ORDER — VITAMIN D 25 MCG (1000 UNIT) PO TABS
2000.0000 [IU] | ORAL_TABLET | Freq: Two times a day (BID) | ORAL | Status: DC
  Administered 2019-05-17 – 2019-05-19 (×4): 2000 [IU] via ORAL
  Filled 2019-05-17 (×4): qty 2

## 2019-05-17 MED ORDER — HYDRALAZINE HCL 20 MG/ML IJ SOLN
10.0000 mg | INTRAMUSCULAR | Status: DC | PRN
  Administered 2019-05-17: 10 mg via INTRAVENOUS

## 2019-05-17 MED ORDER — HYDRALAZINE HCL 20 MG/ML IJ SOLN
INTRAMUSCULAR | Status: AC
  Filled 2019-05-17: qty 1

## 2019-05-17 MED ORDER — PHENYLEPHRINE 40 MCG/ML (10ML) SYRINGE FOR IV PUSH (FOR BLOOD PRESSURE SUPPORT)
PREFILLED_SYRINGE | INTRAVENOUS | Status: AC
  Filled 2019-05-17: qty 10

## 2019-05-17 MED ORDER — DEXMEDETOMIDINE HCL 200 MCG/2ML IV SOLN
INTRAVENOUS | Status: DC | PRN
  Administered 2019-05-17 (×2): 8 ug via INTRAVENOUS
  Administered 2019-05-17: 40 ug via INTRAVENOUS
  Administered 2019-05-17: 8 ug via INTRAVENOUS

## 2019-05-17 MED ORDER — ONDANSETRON HCL 4 MG/2ML IJ SOLN
INTRAMUSCULAR | Status: AC
  Filled 2019-05-17: qty 2

## 2019-05-17 MED ORDER — METOCLOPRAMIDE HCL 10 MG PO TABS: 5.0000 mg | ORAL_TABLET | Freq: Three times a day (TID) | ORAL | Status: DC | PRN

## 2019-05-17 MED ORDER — VITAMIN B-1 100 MG PO TABS
100.0000 mg | ORAL_TABLET | Freq: Every day | ORAL | Status: DC
  Administered 2019-05-18 – 2019-05-19 (×2): 100 mg via ORAL
  Filled 2019-05-17 (×2): qty 1

## 2019-05-17 SURGICAL SUPPLY — 70 items
BANDAGE ACE 6X5 VEL STRL LF (GAUZE/BANDAGES/DRESSINGS) ×4 IMPLANT
BIT DRILL 4.2 (DRILL) IMPLANT
BLADE CLIPPER SURG (BLADE) IMPLANT
BNDG ELASTIC 4X5.8 VLCR STR LF (GAUZE/BANDAGES/DRESSINGS) ×4 IMPLANT
BNDG GAUZE ELAST 4 BULKY (GAUZE/BANDAGES/DRESSINGS) ×4 IMPLANT
BRUSH SCRUB EZ PLAIN DRY (MISCELLANEOUS) ×8 IMPLANT
CANISTER SUCT 3000ML PPV (MISCELLANEOUS) ×4 IMPLANT
COVER SURGICAL LIGHT HANDLE (MISCELLANEOUS) ×4 IMPLANT
COVER WAND RF STERILE (DRAPES) ×4 IMPLANT
DRAPE C-ARM 42X72 X-RAY (DRAPES) ×4 IMPLANT
DRAPE C-ARMOR (DRAPES) ×4 IMPLANT
DRAPE IMP U-DRAPE 54X76 (DRAPES) ×4 IMPLANT
DRAPE ORTHO SPLIT 77X108 STRL (DRAPES) ×12
DRAPE SURG ORHT 6 SPLT 77X108 (DRAPES) ×6 IMPLANT
DRAPE U-SHAPE 47X51 STRL (DRAPES) ×4 IMPLANT
DRILL 4.2 (DRILL) ×4
DRSG ADAPTIC 3X8 NADH LF (GAUZE/BANDAGES/DRESSINGS) ×4 IMPLANT
DRSG MEPILEX BORDER 4X4 (GAUZE/BANDAGES/DRESSINGS) ×2 IMPLANT
DRSG MEPILEX BORDER 4X8 (GAUZE/BANDAGES/DRESSINGS) ×2 IMPLANT
DRSG PAD ABDOMINAL 8X10 ST (GAUZE/BANDAGES/DRESSINGS) ×16 IMPLANT
ELECT REM PT RETURN 9FT ADLT (ELECTROSURGICAL) ×4
ELECTRODE REM PT RTRN 9FT ADLT (ELECTROSURGICAL) ×2 IMPLANT
EVACUATOR 1/8 PVC DRAIN (DRAIN) IMPLANT
EVACUATOR 3/16  PVC DRAIN (DRAIN)
EVACUATOR 3/16 PVC DRAIN (DRAIN) IMPLANT
GAUZE SPONGE 4X4 12PLY STRL (GAUZE/BANDAGES/DRESSINGS) ×4 IMPLANT
GLOVE BIO SURGEON STRL SZ7.5 (GLOVE) ×4 IMPLANT
GLOVE BIO SURGEON STRL SZ8 (GLOVE) ×4 IMPLANT
GLOVE BIOGEL PI IND STRL 7.5 (GLOVE) ×2 IMPLANT
GLOVE BIOGEL PI IND STRL 8 (GLOVE) ×2 IMPLANT
GLOVE BIOGEL PI INDICATOR 7.5 (GLOVE) ×2
GLOVE BIOGEL PI INDICATOR 8 (GLOVE) ×2
GOWN STRL REUS W/ TWL LRG LVL3 (GOWN DISPOSABLE) ×4 IMPLANT
GOWN STRL REUS W/ TWL XL LVL3 (GOWN DISPOSABLE) ×2 IMPLANT
GOWN STRL REUS W/TWL LRG LVL3 (GOWN DISPOSABLE) ×8
GOWN STRL REUS W/TWL XL LVL3 (GOWN DISPOSABLE) ×4
GUIDEWIRE 3.2X400 (WIRE) ×2 IMPLANT
GUIDEWIRE THREADED 2.8 (WIRE) ×4 IMPLANT
KIT BASIN OR (CUSTOM PROCEDURE TRAY) ×4 IMPLANT
KIT TURNOVER KIT B (KITS) ×4 IMPLANT
NAIL CANN TFNA 10X44 130D RT (Nail) ×2 IMPLANT
NEEDLE 22X1 1/2 (OR ONLY) (NEEDLE) IMPLANT
NS IRRIG 1000ML POUR BTL (IV SOLUTION) ×4 IMPLANT
PACK TOTAL JOINT (CUSTOM PROCEDURE TRAY) ×4 IMPLANT
PACK UNIVERSAL I (CUSTOM PROCEDURE TRAY) ×4 IMPLANT
PAD ARMBOARD 7.5X6 YLW CONV (MISCELLANEOUS) ×8 IMPLANT
PAD CAST 4YDX4 CTTN HI CHSV (CAST SUPPLIES) ×2 IMPLANT
PADDING CAST COTTON 4X4 STRL (CAST SUPPLIES) ×4
PADDING CAST COTTON 6X4 STRL (CAST SUPPLIES) ×4 IMPLANT
REAMER ROD DEEP FLUTE 2.5X950 (INSTRUMENTS) ×2 IMPLANT
SCREW LOCK 5.0MMX52NN (Screw) ×2 IMPLANT
SCREW LOCKING 5.0X46MM (Screw) ×2 IMPLANT
SCREW TFNA 105MM STERILE (Screw) ×2 IMPLANT
SPONGE LAP 18X18 RF (DISPOSABLE) ×4 IMPLANT
STAPLER VISISTAT 35W (STAPLE) ×4 IMPLANT
SUCTION FRAZIER HANDLE 10FR (MISCELLANEOUS) ×2
SUCTION TUBE FRAZIER 10FR DISP (MISCELLANEOUS) ×2 IMPLANT
SUT PROLENE 0 CT 2 (SUTURE) IMPLANT
SUT STEEL 6MS V (SUTURE) ×2 IMPLANT
SUT VIC AB 0 CT1 27 (SUTURE) ×8
SUT VIC AB 0 CT1 27XBRD ANBCTR (SUTURE) ×4 IMPLANT
SUT VIC AB 1 CT1 27 (SUTURE) ×8
SUT VIC AB 1 CT1 27XBRD ANBCTR (SUTURE) ×4 IMPLANT
SUT VIC AB 2-0 CT1 27 (SUTURE) ×8
SUT VIC AB 2-0 CT1 TAPERPNT 27 (SUTURE) ×4 IMPLANT
SYR 20ML ECCENTRIC (SYRINGE) IMPLANT
TOWEL GREEN STERILE (TOWEL DISPOSABLE) ×8 IMPLANT
TOWEL GREEN STERILE FF (TOWEL DISPOSABLE) ×4 IMPLANT
TRAY FOLEY MTR SLVR 16FR STAT (SET/KITS/TRAYS/PACK) IMPLANT
WATER STERILE IRR 1000ML POUR (IV SOLUTION) ×8 IMPLANT

## 2019-05-17 NOTE — Op Note (Signed)
NAME: Richard Lopez, Richard Lopez MEDICAL RECORD ZO:[id number] ACCOUNT [id number] DATE OF BIRTH:April 13, 1976 FACILITY: [facility] LOCATION: [id number] PHYSICIAN:Mckaylah Bettendorf H. Janiaya Ryser, MD  OPERATIVE REPORT  DATE OF PROCEDURE:  05/17/2019  PREOPERATIVE DIAGNOSES:   1.  Right femoral shaft peri-implant fracture. 2.  Loose hardware, right femur. 3.  Right knee effusion.  POSTOPERATIVE DIAGNOSES:   1.  Right femoral shaft peri-implant fracture. 2.  Loose hardware, right femur. 3.  Right knee effusion.   PROCEDURES: 1.  Intramedullary nailing of the right femur using a S[employer]s TFN 10 mm x 440 mm statically locked nail. 2.  Removal of loose and bent hardware, right proximal femur. 3.  Right knee aspiration 45 cc.  SURGEON:  Myrene Galas, MD  ASSISTANT:  Montez Morita, PA-C.  ANESTHESIA:  General.  COMPLICATIONS:  None.  ESTIMATED BLOOD LOSS:  150 mL  SPECIMENS:  None.  DRAINS:  None.  ANESTHESIA:  Local none.  DISPOSITION:  To PACU.  CONDITION:  Stable.  BRIEF SUMMARY FOR PROCEDURE:  The patient is a 43 year old male who has struggled with heavy alcohol use including prior history of delirium tremens and multiple fractures with low testosterone and low vitamin D.  He underwent a repair of a right  proximal femur fracture on 05/22 and after drinking alcohol yesterday fell while on his crutches and sustained a break at the tip of the implant, which did spiral up into the screws.  I discussed with him preoperatively the risks and benefits of surgery  including the potential for nerve injury, vessel injury, DVT, PE, malunion, symptomatic hardware, prolonged intubation and others.  We also specifically discussed whether he had any interest in ceasing alcohol consumption and he stated that he did and  was open to withholding alcohol and doing the CIWA protocol with medication in lieu of continuing with drinking while admitted.  He did provide consent to proceed.  BRIEF SUMMARY OF PROCEDURE:  The patient  received 2 grams of Ancef preoperatively.  He was taken to the operating room where general anesthesia was induced.  The right lower extremity was prepped and draped in the usual sterile fashion.  Beginning with  chlorhexidine wash, Betadine scrub and paint.  A timeout was held.  A C-arm brought in to identify the correct position for his incisions.  Separate incisions and extensions had to be made to accommodate hardware removal of the distal locking bolt and  also one of the cannulated screws that we elected to remove, as well, as it was within the fracture site.  Simple intramedullary nailing would not have required this additional incision.  Using the old locking bolt site and again extending that distally as well as the more posterior incision along the hip, a single extensile incision was made.  Dissection carried carefully down to the tensor, which was split in line with the incision.  We  then retracted the vastus dividing it and retracting the muscle belly anteriorly.  I had separate elevation of the tissues proximally to enable visibility of the cannulated screws and then I also remade a more proximal incision for access to the  proximal aspect of the nail.  Through this, I introduced a series of curets to establish the trajectory and clear the head of the nail and then introduced the screwdriver to back off the set screw followed by use of the extraction bolt, which was engaged  proximally.  The so-called twisted sister lag screw was then removed from the femoral head and no complications were encountered with regard to  this.  Distally, dissection enabled [place] to identify the head of the locking bolt and remove it also.   Once all implants were removed, my assistant pulled traction and derotated the fracture site enabling me to pass and secure 2 wires in a cerclage technique. While this was secured, my assistant held the proximal femur in valgus.  It  interdigitated nicely.  We then placed the ball  tip guidewire down through the femur, checking its position measuring for a 440 mm nail.  It was sequentially reamed up to a 12 encountering very slight chatter distally at 11 mm.  The nail was inserted to the  appropriate depth engaging the previously used hole for the lag device but this time inserting a screw rather than a blade, also using 105 mm. I did use the tap prior to placement to make sure that it was scored appropriately for seating of a screw as  opposed to the blade and checking it for length as well.  Distally, a perfect circle technique was used to place 2 screws.  We were careful not to malrotate the leg during insertion of the implant or placement of the distal screws.  In spite of this, it  appeared to have moved 1 mm or so and again a slight valgus pressure was applied throughout.  Final C-arm images, AP and lateral showed appropriate reduction, hardware placement trajectory and length.  No complications during the case.  [name], PA-C,  was present and assisting throughout and was necessary for deep dissection, exposure of the loose implants as well as for producing and maintaining a reduction of the femoral shaft during intramedullary nailing and also assisted with wound closure.  Of  note, I did tighten the more proximal of the cannulated screws which was not within the fracture site and I removed the more distal cannulated screw which was in fact in the fracture site.  The inferior screw was also noted to be slightly bent.  The top  screw did provide a good purchase.  Lastly, we turned attention to the right knee effusion, and used an 18 gauge needle to witdraw 45 cc of straw colored fluid which was sent for laboratory analysis.   PROGNOSIS:  The patient appears to be progressing toward some union in spite of his poor nutrition, low vitamin D, low testosterone and continued alcohol use as judged by the preoperative CT scan, but his ability to go on to unite and avoid subsequent  or  worse problems is largely related to his ability to remain sober.  He will have unrestricted range of motion of the knee and ankle with nonweightbearing given his poor bone quality and potential for reinjury. He would benefit from rheumatologic management, which will be further discussed with him.  TN/NUANCE  D:05/17/2019 T:05/17/2019 JOB:[id number]

## 2019-05-17 NOTE — Consult Note (Signed)
Orthopaedic Trauma Service Consultation  Reason for Consult:Right femoral shaft fracture, peri-implant Referring Physician: Darreld Mclean, MD  Richard Lopez is an 43 y.o. male.  HPI: Larey Seat after drinking a 40 yesterday while on crutches. Pain severe, inability to bear weight without tingling or numbness.  Past Medical History:  Diagnosis Date  . Anxiety   . 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 Depression   . Diabetes mellitus    diet controlled  . Essential hypertension   . Gastritis   . Gout     Past Surgical History:  Procedure Laterality Date  . EXTERNAL FIXATION LEG Left 11/07/2018   Procedure: EXTERNAL FIXATION LEFT LOWER LEG;  Surgeon: Samson Frederic, MD;  Location: [facility];  Service: Orthopedics;  Laterality: Left;  . EXTERNAL FIXATION REMOVAL Left 11/08/2018   Procedure: REMOVAL EXTERNAL FIXATION LEG;  Surgeon: Roby Lofts, MD;  Location: [facility];  Service: Orthopedics;  Laterality: Left;  . INTRAMEDULLARY (IM) NAIL INTERTROCHANTERIC Right 03/26/2019   Procedure: INTRAMEDULLARY (IM) NAIL INTERTROCHANTRIC;  Surgeon: Roby Lofts, MD;  Location: [facility];  Service: Orthopedics;  Laterality: Right;  . NO PAST SURGERIES    . OPEN REDUCTION INTERNAL FIXATION (ORIF) TIBIA/FIBULA FRACTURE Left 11/08/2018   Procedure: OPEN REDUCTION INTERNAL FIXATION (ORIF) TIBIA/FIBULA FRACTURE;  Surgeon: Roby Lofts, MD;  Location: [facility];  Service: Orthopedics;  Laterality: Left;    Family History  Problem Relation Age of Onset  . Diabetes Mellitus II Father   . Diabetes Mellitus II Other   . CAD Other     Social History:  reports that he has been smoking cigarettes. He has been smoking about 0.00 packs per day. He has never used smokeless tobacco. He reports current alcohol use of about 200.0 standard drinks of alcohol per week. He reports current drug use. Drug: Marijuana.  Allergies: No Known Allergies  Medications:  Prior to Admission:  Medications Prior to Admission  Medication  Sig Dispense Refill Last Dose  . amLODipine (NORVASC) 10 MG tablet Take 1 tablet (10 mg total) by mouth daily. (Patient not taking: Reported on 05/16/2019) 30 tablet 1 Not Taking at Unknown time  . cholecalciferol (VITAMIN D3) 25 MCG (1000 UT) tablet Take 2 tablets (2,000 Units total) by mouth 2 (two) times daily. (Patient not taking: Reported on 05/16/2019) 30 tablet 0 Not Taking at Unknown time  . lisinopril (ZESTRIL) 10 MG tablet Take 1 tablet (10 mg total) by mouth daily. (Patient not taking: Reported on 05/16/2019) 30 tablet 0 Not Taking at Unknown time  . Melatonin 5 MG CAPS Take 1 capsule (5 mg total) by mouth at bedtime as needed (sleep). (Patient not taking: Reported on 05/16/2019) 15 each 0 Not Taking at Unknown time  . pantoprazole (PROTONIX) 40 MG tablet Take 1 tablet (40 mg total) by mouth 2 (two) times daily. (Patient not taking: Reported on 05/16/2019) 60 tablet 0 Not Taking at Unknown time  . senna-docusate (SENOKOT-S) 8.6-50 MG tablet Take 1 tablet by mouth at bedtime as needed for mild constipation. (Patient not taking: Reported on 05/16/2019) 20 tablet 0 Not Taking at Unknown time  . thiamine 100 MG tablet Take 1 tablet (100 mg total) by mouth daily. (Patient not taking: Reported on 05/16/2019) 30 tablet 0 Not Taking at Unknown time    Results for orders placed or performed during the hospital encounter of 05/16/19 (from the past 48 hour(s))  CBC with Differential/Platelet     Status: Abnormal   Collection Time:  05/16/19  8:02 PM  Result Value Ref Range   WBC 5.5 4.0 - 10.5 K/uL   RBC 4.08 (L) 4.22 - 5.81 MIL/uL   Hemoglobin 11.5 (L) 13.0 - 17.0 g/dL   HCT 11.936.8 (L) 14.739.0 - 82.952.0 %   MCV 90.2 80.0 - 100.0 fL   MCH 28.2 26.0 - 34.0 pg   MCHC 31.3 30.0 - 36.0 g/dL   RDW 56.215.5 13.011.5 - 86.515.5 %   Platelets 356 150 - 400 K/uL   nRBC 0.0 0.0 - 0.2 %   Neutrophils Relative % 35 %   Neutro Abs 1.9 1.7 - 7.7 K/uL   Lymphocytes Relative 56 %   Lymphs Abs 3.1 0.7 - 4.0 K/uL   Monocytes Relative  6 %   Monocytes Absolute 0.3 0.1 - 1.0 K/uL   Eosinophils Relative 1 %   Eosinophils Absolute 0.0 0.0 - 0.5 K/uL   Basophils Relative 2 %   Basophils Absolute 0.1 0.0 - 0.1 K/uL   Immature Granulocytes 0 %   Abs Immature Granulocytes 0.01 0.00 - 0.07 K/uL    Comment: Performed at [facility], [address] [place], [place] [address]  Basic metabolic panel     Status: Abnormal   Collection Time: 05/16/19  8:02 PM  Result Value Ref Range   Sodium 139 135 - 145 mmol/L   Potassium 3.5 3.5 - 5.1 mmol/L   Chloride 100 98 - 111 mmol/L   CO2 19 (L) 22 - 32 mmol/L   Glucose, Bld 113 (H) 70 - 99 mg/dL   BUN 5 (L) 6 - 20 mg/dL   Creatinine, Ser 6.290.90 0.61 - 1.24 mg/dL   Calcium 8.9 8.9 - 52.810.3 mg/dL   GFR calc non Af Amer >60 >60 mL/min   GFR calc Af Amer >60 >60 mL/min   Anion gap 20 (H) 5 - 15    Comment: Performed at [facility], [address] [place], [place] [address]  SARS Coronavirus 2 (CEPHEID - Performed in [facility] hospital lab), Hosp Order     Status: None   Collection Time: 05/16/19 10:07 PM   Specimen: Nasopharyngeal Swab  Result Value Ref Range   SARS Coronavirus 2 NEGATIVE NEGATIVE    Comment: (NOTE) If result is NEGATIVE SARS-CoV-2 target nucleic acids are NOT DETECTED. The SARS-CoV-2 RNA is generally detectable in upper and lower  respiratory specimens during the acute phase of infection. The lowest  concentration of SARS-CoV-2 viral copies this assay can detect is 250  copies / mL. A negative result does not preclude SARS-CoV-2 infection  and should not be used as the sole basis for treatment or other  patient management decisions.  A negative result may occur with  improper specimen collection / handling, submission of specimen other  than nasopharyngeal swab, presence of viral mutation(s) within the  areas targeted by this assay, and inadequate number of viral copies  (<250 copies / mL). A negative result must be combined with clinical   observations, patient history, and epidemiological information. If result is POSITIVE SARS-CoV-2 target nucleic acids are DETECTED. The SARS-CoV-2 RNA is generally detectable in upper and lower  respiratory specimens dur ing the acute phase of infection.  Positive  results are indicative of active infection with SARS-CoV-2.  Clinical  correlation with patient history and other diagnostic information is  necessary to determine patient infection status.  Positive results do  not rule out bacterial infection or co-infection with other viruses. If result is  PRESUMPTIVE POSTIVE SARS-CoV-2 nucleic acids MAY BE PRESENT.   A presumptive positive result was obtained on the submitted specimen  and confirmed on repeat testing.  While 2019 novel coronavirus  (SARS-CoV-2) nucleic acids may be present in the submitted sample  additional confirmatory testing may be necessary for epidemiological  and / or clinical management purposes  to differentiate between  SARS-CoV-2 and other Sarbecovirus currently known to infect humans.  If clinically indicated additional testing with an alternate test  methodology [phone]) is advised. The SARS-CoV-2 RNA is generally  detectable in upper and lower respiratory sp ecimens during the acute  phase of infection. The expected result is Negative. Fact Sheet for Patients:  BoilerBrush.com.cy Fact Sheet for Healthcare Providers: https://pope.com/ This test is not yet approved or cleared by the [place] FDA and has been authorized for detection and/or diagnosis of SARS-CoV-2 by FDA under an Emergency Use Authorization (EUA).  This EUA will remain in effect (meaning this test can be used) for the duration of the COVID-19 declaration under Section 564(b)(1) of the Act, 21 U.S.C. section 360bbb-3(b)(1), unless the authorization is terminated or revoked sooner. Performed at [facility], [address],  [place], [place] [address]   Ethanol     Status: Abnormal   Collection Time: 05/16/19 11:12 PM  Result Value Ref Range   Alcohol, Ethyl (B) 195 (H) <10 mg/dL    Comment: (NOTE) Lowest detectable limit for serum alcohol is 10 mg/dL. For medical purposes only. Performed at [facility], [address], [place], [place] [address]   Surgical pcr screen     Status: None   Collection Time: 05/17/19  1:34 AM   Specimen: Nasal Mucosa; Nasal Swab  Result Value Ref Range   MRSA, PCR NEGATIVE NEGATIVE   Staphylococcus aureus NEGATIVE NEGATIVE    Comment: (NOTE) The Xpert SA Assay (FDA approved for NASAL specimens in patients 27 years of age and older), is one component of a comprehensive surveillance program. It is not intended to diagnose infection nor to guide or monitor treatment. Performed at [facility], [address], [place], [place] [address]   Basic metabolic panel     Status: Abnormal   Collection Time: 05/17/19  3:35 AM  Result Value Ref Range   Sodium 140 135 - 145 mmol/L   Potassium 4.0 3.5 - 5.1 mmol/L   Chloride 102 98 - 111 mmol/L   CO2 21 (L) 22 - 32 mmol/L   Glucose, Bld 123 (H) 70 - 99 mg/dL   BUN 5 (L) 6 - 20 mg/dL   Creatinine, Ser 3.56 0.61 - 1.24 mg/dL   Calcium 8.8 (L) 8.9 - 10.3 mg/dL   GFR calc non Af Amer >60 >60 mL/min   GFR calc Af Amer >60 >60 mL/min   Anion gap 17 (H) 5 - 15    Comment: Performed at [facility], [address], [place], [place] [address]  CBC     Status: Abnormal   Collection Time: 05/17/19  3:35 AM  Result Value Ref Range   WBC 6.1 4.0 - 10.5 K/uL   RBC 3.81 (L) 4.22 - 5.81 MIL/uL   Hemoglobin 11.0 (L) 13.0 - 17.0 g/dL   HCT 03.0 (L) 13.1 - 43.8 %   MCV 84.8 80.0 - 100.0 fL   MCH 28.9 26.0 - 34.0 pg   MCHC 34.1 30.0 - 36.0 g/dL   RDW 88.7 57.9 - 72.8 %   Platelets 312 150 -  400 K/uL   nRBC 0.0 0.0 - 0.2 %    Comment: Performed at [facility], [place] [address] [place], [place] [address]  Magnesium      Status: Abnormal   Collection Time: 05/17/19  3:35 AM  Result Value Ref Range   Magnesium 1.2 (L) 1.7 - 2.4 mg/dL    Comment: Performed at [facility] [address], [place], [place] [address]    Dg Pelvis Portable  Result Date: 05/16/2019 CLINICAL DATA:  Fall history of IM nail right femur EXAM: PORTABLE PELVIS 1-2 VIEWS COMPARISON:  03/25/2019, CT 04/12/2019 FINDINGS: Pubic symphysis and rami are intact. Mild arthritis left hip. Left femoral head projects in joint. Incompletely visualized intramedullary rod and fixating screws in the proximal right femur. Prior intertrochanteric fracture with callus about the fracture site. IMPRESSION: 1. Prior intramedullary rod and screw fixation of proximal right femur for intertrochanteric and subtrochanteric fracture, incompletely visualized. 2. No acute osseous abnormality of the left hip Electronically Signed   By: Donavan Foil M.D.   On: 05/16/2019 21:14   Ct Hip Right Wo Contrast  Result Date: 05/16/2019 CLINICAL DATA: Recent fracture with internal fixation.  New fall. EXAM: CT OF THE RIGHT HIP WITHOUT CONTRAST TECHNIQUE: Multidetector CT imaging of the right hip was performed according to the standard protocol. Multiplanar CT image reconstructions were also generated. COMPARISON:  Plain films April 10, 2019.  CT April 11, 2019. FINDINGS: Previously seen fracture through the right femur intertrochanteric and subtrochanteric regions are again noted. Femoral neck screws and intramedullary rod noted in place. There is extensive callus formation but fracture line remains evident. In addition, there is a new displaced fracture through the proximal femoral shaft at the level of the intramedullary nail. The distal fragment is displaced anteriorly greater than 1 shaft width with overlapping fracture fragments. No subluxation or dislocation. IMPRESSION: Healing right femoral intertrochanteric and subtrochanteric fractures with hardware in place. Extensive callus  formation noted, but fracture line remains evident. New displaced and overlapping proximal femoral shaft fracture at the level of the intramedullary nail. Distal fragment is displaced anteriorly greater than 1 shaft width. Electronically Signed   By: Rolm Baptise M.D.   On: 05/16/2019 23:06   Dg Chest Port 1 View  Result Date: 05/16/2019 CLINICAL DATA:  Preop, fall EXAM: PORTABLE CHEST 1 VIEW COMPARISON:  04/09/2019 FINDINGS: No focal airspace disease or effusion. Normal cardiomediastinal silhouette. No pneumothorax. IMPRESSION: No active disease. Electronically Signed   By: Donavan Foil M.D.   On: 05/16/2019 21:10   Dg Femur Portable Min 2 Views Right  Result Date: 05/16/2019 CLINICAL DATA:  Fall EXAM: RIGHT FEMUR PORTABLE 2 VIEW COMPARISON:  CT 04/12/2019, 04/11/2019, 04/10/2019, femur radiograph 03/25/2019 FINDINGS: Vascular calcifications within the soft tissues. Prior intramedullary rod a directed screws within the right femur for intertrochanteric and subtrochanteric fracture. Callus formation is noted. Acute oblique fracture proximal diaphysis of the femur, involves the distal stem of the femoral prosthesis. Mild varus angulation of distal fracture fragment and slightly greater than 1/2 bone with of lateral displacement of distal fracture fragment. IMPRESSION: 1. Acute displaced and slightly angulated periprosthetic fracture involving the proximal shaft of the femur 2. Prior intramedullary rod and screw fixation of right femur for intertrochanteric fracture Electronically Signed   By: Donavan Foil M.D.   On: 05/16/2019 21:17    ROS No recent fever, bleeding abnormalities, urologic dysfunction, GI problems, or weight gain.  Blood pressure (!) 171/100, pulse (!) 102, temperature 98.1  F (36.7 C), temperature source Oral, resp. rate 14, height 6\' 6"  (1.981 m), weight 96.9 kg, SpO2 99 %. Physical Exam NCAT RRR S/NT/ND RLE Buck's traction  Edema/ swelling controlled  Sens: DPN, SPN, TN  intact  Motor: EHL, FHL, and lessor toe ext and flex all intact grossly  Brisk cap refill, warm to touch  Assessment/Plan: Right femoral shaft fracture ETOH   Patient is at least slightly interested in quitting alcohol so we will withold alcohol with meals and use CIWA intstead.  I discussed with the patient the risks and benefits of surgery, including the possibility of infection, nerve injury, vessel injury, wound breakdown, arthritis, symptomatic hardware, DVT/ PE, loss of motion, malunion, nonunion, and need for further surgery among others.  We also specifically discussed the risk of alcohol withdrawal.  He acknowledged these risks and wished to proceed.  Myrene GalasMichael Arnol Mcgibbon, MD [facility], [facility] [phone]  05/17/2019  8:05 AM

## 2019-05-17 NOTE — Transfer of Care (Signed)
Immediate Anesthesia Transfer of Care Note  Patient: Richard Lopez  Procedure(s) Performed: REMOVAL  OF HARDWARE (Right Leg Upper) Intramedullary (Im) Nail Intertroch with circlage wiring (Right Hip)  Patient Location: PACU  Anesthesia Type:General  Level of Consciousness: awake and patient cooperative  Airway & Oxygen Therapy: Patient Spontanous Breathing and Patient connected to nasal cannula oxygen  Post-op Assessment: Report given to RN and Post -op Vital signs reviewed and stable  Post vital signs: Reviewed and stable  Last Vitals:  Vitals Value Taken Time  BP 179/108 05/17/19 1144  Temp    Pulse 92 05/17/19 1145  Resp 19 05/17/19 1145  SpO2 99 % 05/17/19 1145  Vitals shown include unvalidated device data.  Last Pain:  Vitals:   05/17/19 0450  TempSrc:   PainSc: 10-Worst pain ever         Complications: No apparent anesthesia complications

## 2019-05-17 NOTE — Progress Notes (Signed)
Attempted to call report x 3, no answer at [phone].

## 2019-05-17 NOTE — Brief Op Note (Signed)
05/16/2019 - 05/17/2019  11:30 AM  PATIENT:  Richard Lopez  43 y.o. male  PRE-OPERATIVE DIAGNOSIS:   1. Right Femoral Shaft Peri-implant Fracture 2. Loose Hardware Right Femur  POST-OPERATIVE DIAGNOSIS:   1. Right Femoral Shaft Peri-implant Fracture 2. Loose Hardware Right Femur  PROCEDURE:  Procedure(s): 1. REMOVAL  OF HARDWARE (Right) 2. Intramedullary Nailing of Right Femur with Synthes TFN 10 mm x 440 mm statically locked  SURGEON:  Surgeon(s) and Role:    Altamese Dobbs [place]erry, MD - Primary  PHYSICIAN ASSISTANT: [name], PA-C  ANESTHESIA:   general  EBL:  250 mL   BLOOD ADMINISTERED:none  DRAINS: none   LOCAL MEDICATIONS USED:  NONE  SPECIMEN:  No Specimen  DISPOSITION OF SPECIMEN:  N/A  COUNTS:  YES  TOURNIQUET:  * No tourniquets in log *  DICTATION: .Other Dictation: Dictation Number [id number]  PLAN OF CARE: Admit to inpatient   PATIENT DISPOSITION:  PACU - hemodynamically stable.   Delay start of Pharmacological VTE agent (>24hrs) due to surgical blood loss or risk of bleeding: no

## 2019-05-17 NOTE — Anesthesia Procedure Notes (Signed)
Procedure Name: Intubation Date/Time: 05/17/2019 8:20 AM Performed by: Orlie Dakin, CRNA Pre-anesthesia Checklist: Patient identified, Emergency Drugs available, Suction available and Patient being monitored Patient Re-evaluated:Patient Re-evaluated prior to induction Oxygen Delivery Method: Circle system utilized Preoxygenation: Pre-oxygenation with 100% oxygen Induction Type: IV induction, Rapid sequence and Cricoid Pressure applied Laryngoscope Size: Mac and 4 Grade View: Grade I Tube type: Oral Tube size: 7.5 mm Number of attempts: 1 Airway Equipment and Method: Stylet Placement Confirmation: ETT inserted through vocal cords under direct vision,  positive ETCO2 and breath sounds checked- equal and bilateral Secured at: 23 cm Tube secured with: Tape Dental Injury: Teeth and Oropharynx as per pre-operative assessment  Comments: RSI due to nausea, dry heaving in Short-Stay.

## 2019-05-17 NOTE — Anesthesia Preprocedure Evaluation (Addendum)
Anesthesia Evaluation  Patient identified by MRN, date of birth, ID band Patient awake    Reviewed: Allergy & Precautions, H&P , NPO status , Patient's Chart, lab work & pertinent test results  Airway Mallampati: II   Neck ROM: full    Dental  (+) Dental Advisory Given, Missing, Loose, Poor Dentition   Pulmonary Current Smoker,    breath sounds clear to auscultation       Cardiovascular hypertension,  Rhythm:regular Rate:Normal     Neuro/Psych PSYCHIATRIC DISORDERS Anxiety Depression    GI/Hepatic   Endo/Other  diabetes, Type 2  Renal/GU      Musculoskeletal   Abdominal   Peds  Hematology   Anesthesia Other Findings   Reproductive/Obstetrics                          Anesthesia Physical Anesthesia Plan  ASA: II  Anesthesia Plan: General   Post-op Pain Management:    Induction: Intravenous  PONV Risk Score and Plan: 1 and Ondansetron, Dexamethasone, Midazolam and Treatment may vary due to age or medical condition  Airway Management Planned: Oral ETT  Additional Equipment:   Intra-op Plan:   Post-operative Plan: Extubation in OR  Informed Consent: I have reviewed the patients History and Physical, chart, labs and discussed the procedure including the risks, benefits and alternatives for the proposed anesthesia with the patient or authorized representative who has indicated his/her understanding and acceptance.       Plan Discussed with: CRNA, Anesthesiologist and Surgeon  Anesthesia Plan Comments:         Anesthesia Quick Evaluation

## 2019-05-17 NOTE — Progress Notes (Addendum)
PROGRESS NOTE    Richard Lopez  NOB:[id number] DOB: August 04, 1976 DOA: 05/16/2019 PCP: [name], NP    Brief Narrative:  43 year old male who presented after right hip pain.  He does have significant past medical for type 2 diabetes mellitus, and alcohol abuse.  He sustained a mechanical fall while trying to sit, landing on his right hip.  No head trauma.  On his initial physical examination blood pressure 172/102, heart rate 95, respirate 18, temperature 98.2, oxygen saturation 98%, his lungs are clear to auscultation, heart S1-S2 present and rhythmic, abdomen was soft, no lower extremity edema.  Right femur films showed acute displaced and slightly angulated periprosthetic fracture involving the proximal shaft of the femur.  Patient was admitted to the hospital with a working diagnosis of periprosthetic right femur fracture complicated by acute alcohol intoxication.   Assessment & Plan:   Principal Problem:   Peri-prosthetic fracture of femur at tip of prosthesis Active Problems:   Type 2 diabetes mellitus with hyperlipidemia (HCC)   Essential hypertension   Alcohol dependence with intoxication (HCC)   1.  Periprosthetic right femur fracture. Patient is sp orthopedic procedure. Will continue pain control and dvt prophylaxis.   2. History of alcohol abuse and delirium tremens. Today no signs of active withdrawal, will continue lorazepam per CIWA protocol and neuro checks per unit protocol. OK to transfer to med surg unit.   3. T2DM. Continue glucose cover and monitoring with insulin sliding scale.   4, HTN. Continue blood pressure control with amlodipine and lisinopril.    DVT prophylaxis: enoxaparin   Code Status: full Family Communication: no family at the bedside  Disposition Plan/ discharge barriers: pending clinical improvement.   Body mass index is 24.7 kg/m. Malnutrition Type:      Malnutrition Characteristics:      Nutrition Interventions:     RN  Pressure Injury Documentation:     Consultants:   Orthopedics   Procedures:     Antimicrobials:       Subjective: Patient is post procedure, somnolent, not agitated, follows commands. No pain, no nausea or vomiting, no chest pain.   Objective: Vitals:   05/17/19 1215 05/17/19 1230 05/17/19 1245 05/17/19 1307  BP: (!) 149/94 (!) 154/92 (!) 159/91 (!) 155/79  Pulse: (!) 101 99 100 84  Resp: 15 19 19 20   Temp: (!) 97.5 F (36.4 C)  (!) 97.5 F (36.4 C) 98 F (36.7 C)  TempSrc:      SpO2: 99% 100% 100% 98%  Weight:      Height:        Intake/Output Summary (Last 24 hours) at 05/17/2019 1317 Last data filed at 05/17/2019 1248 Gross per 24 hour  Intake 2050 ml  Output 437 ml  Net 1613 ml   Filed Weights   05/17/19 0200  Weight: 96.9 kg    Examination:   General: Not in pain or dyspnea, deconditioned  Neurology: Awake and alert, non focal  E ENT: no pallor, no icterus, oral mucosa moist Cardiovascular: No JVD. S1-S2 present, rhythmic, no gallops, rubs, or murmurs. No lower extremity edema. Pulmonary:  breath sounds bilaterally, adequate air movement, no wheezing, rhonchi or rales. Gastrointestinal. Abdomen with no organomegaly, non tender, no rebound or guarding Skin. No rashes Musculoskeletal: no joint deformities     Data Reviewed: I have personally reviewed following labs and imaging studies  CBC: Recent Labs  Lab 05/16/19 2002 05/17/19 0335  WBC 5.5 6.1  NEUTROABS 1.9  --  HGB 11.5* 11.0*  HCT 36.8* 32.3*  MCV 90.2 84.8  PLT 356 497   Basic Metabolic Panel: Recent Labs  Lab 05/16/19 2002 05/17/19 0335  NA 139 140  K 3.5 4.0  CL 100 102  CO2 19* 21*  GLUCOSE 113* 123*  BUN 5* 5*  CREATININE 0.90 0.82  CALCIUM 8.9 8.8*  MG  --  1.2*   GFR: Estimated Creatinine Clearance: 153.3 mL/min (by C-G formula based on SCr of 0.82 mg/dL). Liver Function Tests: No results for input(s): AST, ALT, ALKPHOS, BILITOT, PROT, ALBUMIN in the last  168 hours. No results for input(s): LIPASE, AMYLASE in the last 168 hours. No results for input(s): AMMONIA in the last 168 hours. Coagulation Profile: No results for input(s): INR, PROTIME in the last 168 hours. Cardiac Enzymes: No results for input(s): CKTOTAL, CKMB, CKMBINDEX, TROPONINI in the last 168 hours. BNP (last 3 results) No results for input(s): PROBNP in the last 8760 hours. HbA1C: No results for input(s): HGBA1C in the last 72 hours. CBG: Recent Labs  Lab 05/17/19 1223  GLUCAP 146*   Lipid Profile: No results for input(s): CHOL, HDL, LDLCALC, TRIG, CHOLHDL, LDLDIRECT in the last 72 hours. Thyroid Function Tests: No results for input(s): TSH, T4TOTAL, FREET4, T3FREE, THYROIDAB in the last 72 hours. Anemia Panel: No results for input(s): VITAMINB12, FOLATE, FERRITIN, TIBC, IRON, RETICCTPCT in the last 72 hours.    Radiology Studies: I have reviewed all of the imaging during this hospital visit personally     Scheduled Meds: . amLODipine  10 mg Oral Daily  . cholecalciferol  2,000 Units Oral BID  . docusate sodium  100 mg Oral BID  . [START ON 05/18/2019] enoxaparin (LOVENOX) injection  40 mg Subcutaneous Q24H  . fentaNYL      . folic acid  1 mg Oral Daily  . hydrALAZINE      . lisinopril  10 mg Oral Daily  . multivitamin with minerals  1 tablet Oral Daily  . oxyCODONE      . pantoprazole (PROTONIX) IV  40 mg Intravenous Q12H  . sodium chloride flush  3 mL Intravenous Q12H  . thiamine  100 mg Oral Daily   Continuous Infusions: .  ceFAZolin (ANCEF) IV    . lactated ringers       LOS: 1 day        Mauricio Gerome Apley, MD

## 2019-05-18 DIAGNOSIS — M109 Gout, unspecified: Secondary | ICD-10-CM | POA: Diagnosis present

## 2019-05-18 LAB — CBC
HCT: 25.8 % — ABNORMAL LOW (ref 39.0–52.0)
Hemoglobin: 8.9 g/dL — ABNORMAL LOW (ref 13.0–17.0)
MCH: 29 pg (ref 26.0–34.0)
MCHC: 34.5 g/dL (ref 30.0–36.0)
MCV: 84 fL (ref 80.0–100.0)
Platelets: 213 10*3/uL (ref 150–400)
RBC: 3.07 MIL/uL — ABNORMAL LOW (ref 4.22–5.81)
RDW: 14.9 % (ref 11.5–15.5)
WBC: 7.5 10*3/uL (ref 4.0–10.5)
nRBC: 0 % (ref 0.0–0.2)

## 2019-05-18 LAB — COMPREHENSIVE METABOLIC PANEL
ALT: 9 U/L (ref 0–44)
AST: 20 U/L (ref 15–41)
Albumin: 2.8 g/dL — ABNORMAL LOW (ref 3.5–5.0)
Alkaline Phosphatase: 75 U/L (ref 38–126)
Anion gap: 11 (ref 5–15)
BUN: 8 mg/dL (ref 6–20)
CO2: 26 mmol/L (ref 22–32)
Calcium: 8.1 mg/dL — ABNORMAL LOW (ref 8.9–10.3)
Chloride: 99 mmol/L (ref 98–111)
Creatinine, Ser: 0.83 mg/dL (ref 0.61–1.24)
GFR calc Af Amer: >60 mL/min (ref >60)
GFR calc non Af Amer: >60 mL/min (ref >60)
Glucose, Bld: 131 mg/dL — ABNORMAL HIGH (ref 70–99)
Potassium: 3.6 mmol/L (ref 3.5–5.1)
Sodium: 136 mmol/L (ref 135–145)
Total Bilirubin: 0.7 mg/dL (ref 0.3–1.2)
Total Protein: 5.9 g/dL — ABNORMAL LOW (ref 6.5–8.1)

## 2019-05-18 LAB — RAPID URINE DRUG SCREEN, HOSP PERFORMED
Amphetamines: NOT DETECTED
Barbiturates: NOT DETECTED
Benzodiazepines: POSITIVE — AB
Cocaine: NOT DETECTED
Opiates: POSITIVE — AB
Tetrahydrocannabinol: NOT DETECTED

## 2019-05-18 LAB — GLUCOSE, CAPILLARY
Glucose-Capillary: 118 mg/dL — ABNORMAL HIGH (ref 70–99)
Glucose-Capillary: 134 mg/dL — ABNORMAL HIGH (ref 70–99)

## 2019-05-18 LAB — URIC ACID: Uric Acid, Serum: 8.7 mg/dL — ABNORMAL HIGH (ref 3.7–8.6)

## 2019-05-18 NOTE — Evaluation (Signed)
Physical Therapy Evaluation Patient Details Name: Richard Lopez MRN: [id number] DOB: [birth year] Today's Date: 05/18/2019   History of Present Illness  Pt is a 43 y.o. male admitted 05/16/19 after fall at home sustaining periprosthetic R femur fx, complicated by acute alcohol intoxication. Now s/p R femoral IM nailing with removal of hardware 7/14. Of note, pt with previous admission 03/2019 with R hip fx and 04/2019 with screw loosening. PMH includes ETOH abuse, HTN, DM, gout.    Clinical Impression  Pt presents with an overall decrease in functional mobility secondary to above. PTA, pt has been ambulating with single crutch and RLE 50% PWB since recent admission 04/2019; lives with girlfriend who assists as needed. Pt reports falls happen from "freak accidents," most recently tripping on coffee table. Today, pt ambulatory with RW requiring min guard to minA. Pt would benefit from continued acute PT services to maximize functional mobility and independence prior to d/c home.     Follow Up Recommendations No PT follow up;Supervision for mobility/OOB    Equipment Recommendations  None recommended by PT    Recommendations for Other Services       Precautions / Restrictions Precautions Precautions: Fall Restrictions Weight Bearing Restrictions: Yes RLE Weight Bearing: Non weight bearing      Mobility  Bed Mobility Overal bed mobility: Needs Assistance Bed Mobility: Supine to Sit     Supine to sit: Min assist     General bed mobility comments: assist for RLE over EOB  Transfers Overall transfer level: Needs assistance Equipment used: Rolling walker (2 wheeled) Transfers: Sit to/from Stand Sit to Stand: Min assist;+2 safety/equipment         General transfer comment: boosting assist from EOB; VCs for safe hand placement; cues to back up completely to recliner prior to transition to sitting as pt attempting to reach back/sit prematurely.   Ambulation/Gait Ambulation/Gait  assistance: Min guard Gait Distance (Feet): 40 Feet Assistive device: Rolling walker (2 wheeled)   Gait velocity: Decreased Gait velocity interpretation: <1.8 ft/sec, indicate of risk for recurrent falls General Gait Details: Slow, antalgic gait with RW utilizing hop-to technique on LLE; intermittent cues for RLE NWB as pt resting foot on ground in between steps, but continues to state no weight going throughout RLE. Min guard with RW  Stairs            Wheelchair Mobility    Modified Warf (Stroke Patients Only)       Balance Overall balance assessment: Needs assistance Sitting-balance support: Feet supported Sitting balance-Leahy Scale: Good     Standing balance support: Bilateral upper extremity supported Standing balance-Leahy Scale: Poor Standing balance comment: reliant on UE support to maintain NWB                             Pertinent Vitals/Pain Pain Assessment: 0-10 Pain Score: 10-Worst pain ever Pain Location: R hip Pain Descriptors / Indicators: Discomfort;Sore;Guarding Pain Intervention(s): Monitored during session;Patient requesting pain meds-RN notified    Home Living Family/patient expects to be discharged to:: Private residence Living Arrangements: Spouse/significant other Available Help at Discharge: Friend(s);Available 24 hours/day(girlfriend) Type of Home: House Home Access: Stairs to enter Entrance Stairs-Rails: None Entrance Stairs-Number of Steps: 2 Home Layout: One level Home Equipment: Crutches;Walker - standard;Bedside commode;Shower seat;Tub bench;Walker - 2 wheels      Prior Function Level of Independence: Independent with assistive device(s)         Comments: Since previous hip sx, has  been ambulating with single crutch as RW bothers hands with gout. Pt reports falls are "freak accidents", most recently crutch getting caught on coffee table     Hand Dominance        Extremity/Trunk Assessment   Upper  Extremity Assessment Upper Extremity Assessment: Overall WFL for tasks assessed    Lower Extremity Assessment Lower Extremity Assessment: RLE deficits/detail RLE Deficits / Details: s/p hip fxs with repeated fx/multiple sxs; clear muscle atrophy, swollen R knee; hip/knee strength functionally at least 3/5, limited by pain RLE: Unable to fully assess due to pain RLE Coordination: decreased gross motor;decreased fine motor    Cervical / Trunk Assessment Cervical / Trunk Assessment: Normal  Communication   Communication: No difficulties  Cognition Arousal/Alertness: Awake/alert Behavior During Therapy: Flat affect Overall Cognitive Status: Within Functional Limits for tasks assessed                                 General Comments: pt understandably frustrated for being back in the hospital      General Comments General comments (skin integrity, edema, etc.): HR up to 144 with activity    Exercises     Assessment/Plan    PT Assessment Patient needs continued PT services  PT Problem List Decreased strength;Decreased range of motion;Decreased activity tolerance;Decreased balance;Decreased mobility;Decreased knowledge of use of DME;Decreased knowledge of precautions;Pain       PT Treatment Interventions DME instruction;Gait training;Stair training;Functional mobility training;Therapeutic activities;Therapeutic exercise;Balance training;Patient/family education    PT Goals (Current goals can be found in the Care Plan section)  Acute Rehab PT Goals Patient Stated Goal: Return home and no longer have RLE issues PT Goal Formulation: With patient Time For Goal Achievement: 06/01/19 Potential to Achieve Goals: Good    Frequency Min 5X/week   Barriers to discharge        Co-evaluation   Reason for Co-Treatment: For patient/therapist safety;To address functional/ADL transfers(Limited by pain/fatigue/withdrawal)   OT goals addressed during session: ADL's and  self-care       AM-PAC PT "6 Clicks" Mobility  Outcome Measure Help needed turning from your back to your side while in a flat bed without using bedrails?: A Little Help needed moving from lying on your back to sitting on the side of a flat bed without using bedrails?: A Little Help needed moving to and from a bed to a chair (including a wheelchair)?: A Little Help needed standing up from a chair using your arms (e.g., wheelchair or bedside chair)?: A Little Help needed to walk in hospital room?: A Little Help needed climbing 3-5 steps with a railing? : A Little 6 Click Score: 18    End of Session Equipment Utilized During Treatment: Gait belt Activity Tolerance: Patient tolerated treatment well Patient left: in chair;with call bell/phone within reach Nurse Communication: Mobility status PT Visit Diagnosis: Other abnormalities of gait and mobility (R26.89);Pain Pain - Right/Left: Right Pain - part of body: Leg    Time: 2725-3664 PT Time Calculation (min) (ACUTE ONLY): 23 min   Charges:   PT Evaluation $PT Eval Moderate Complexity: 1 Mod     Ina Homes, PT, DPT Acute Rehabilitation Services  Pager [phone] Office [phone]  Malachy Chamber 05/18/2019, 1:23 PM

## 2019-05-18 NOTE — Anesthesia Postprocedure Evaluation (Signed)
Anesthesia Post Note  Patient: Richard Lopez  Procedure(s) Performed: REMOVAL  OF HARDWARE (Right Leg Upper) Intramedullary (Im) Nail Intertroch with circlage wiring (Right Hip)     Patient location during evaluation: PACU Anesthesia Type: General Level of consciousness: awake and alert Pain management: pain level controlled Vital Signs Assessment: post-procedure vital signs reviewed and stable Respiratory status: spontaneous breathing, nonlabored ventilation, respiratory function stable and patient connected to nasal cannula oxygen Cardiovascular status: blood pressure returned to baseline and stable Postop Assessment: no apparent nausea or vomiting Anesthetic complications: no    Last Vitals:  Vitals:   05/18/19 0400 05/18/19 0804  BP: (!) 147/86 (!) 145/89  Pulse: 72 60  Resp: 14 12  Temp:  36.9 C  SpO2: 98% 99%    Last Pain:  Vitals:   05/18/19 0804  TempSrc: Oral  PainSc:                  [place]

## 2019-05-18 NOTE — Evaluation (Signed)
Occupational Therapy Evaluation Patient Details Name: Richard Lopez MRN: [id number] DOB: 1975/11/22 Today's Date: 05/18/2019    History of Present Illness Pt is a 43 y/o male who presents after mechanical fall at home with R hip pain. Pt admitted to the hospital with a working diagnosis of periprosthetic right femur fracture complicated by acute alcohol intoxication. Pt is now s/p IM nailing of R femur, removal of loose/bent hardware. Of note pt with previous admit in May 2020 for R hip fx.    Clinical Impression   This 43 y/o male presents with the above. PTA pt mod independent with ADL and functional mobility due to recent hip injury. Pt performing functional mobility using RW this session overall with minA, with good ability to maintain NWB in RLE. Pt currently requires setup assist for seated UB ADL, up to maxA for LB ADL secondary to difficulty reaching LEs at this time. Pt reports he lives with significant other who is able to assist with ADL PRN after return home. He will benefit from continued acute OT services to maximize his safety and independence with ADL and mobility prior to return home. Will follow.     Follow Up Recommendations  No OT follow up;Supervision/Assistance - 24 hour    Equipment Recommendations  None recommended by OT           Precautions / Restrictions Precautions Precautions: Fall Restrictions Weight Bearing Restrictions: Yes RLE Weight Bearing: Non weight bearing      Mobility Bed Mobility Overal bed mobility: Needs Assistance Bed Mobility: Supine to Sit     Supine to sit: Min assist     General bed mobility comments: assist for RLE over EOB  Transfers Overall transfer level: Needs assistance Equipment used: Rolling walker (2 wheeled) Transfers: Sit to/from Stand Sit to Stand: Min assist;+2 safety/equipment         General transfer comment: boosting assist from EOB; VCs for safe hand placement; cues to back up completely to recliner prior  to transition to sitting as pt attempting to reach back/sit prematurely.     Balance Overall balance assessment: Needs assistance Sitting-balance support: Feet supported Sitting balance-Leahy Scale: Good     Standing balance support: Bilateral upper extremity supported Standing balance-Leahy Scale: Poor Standing balance comment: reliant on UE support to maintain NWB                           ADL either performed or assessed with clinical judgement   ADL Overall ADL's : Needs assistance/impaired Eating/Feeding: Independent;Sitting   Grooming: Set up;Sitting   Upper Body Bathing: Set up;Sitting   Lower Body Bathing: Minimal assistance;Sit to/from stand   Upper Body Dressing : Set up;Sitting   Lower Body Dressing: +2 for safety/equipment;Maximal assistance;Sit to/from stand;Sitting/lateral leans Lower Body Dressing Details (indicate cue type and reason): pt requires assist to don socks, thread shorts of RLE, assist to advance shorts over hips. minA standing balance; educated on techniques for LB dressing including lateral/leans Toilet Transfer: Minimal assistance;+2 for safety/equipment;Ambulation;RW Toilet Transfer Details (indicate cue type and reason): simulated via transfer to recliner, room level mobility using RW Toileting- Clothing Manipulation and Hygiene: Moderate assistance;Sitting/lateral lean;Sit to/from stand       Functional mobility during ADLs: Minimal assistance;+2 for safety/equipment;Rolling walker       Vision         Perception     Praxis      Pertinent Vitals/Pain Pain Assessment: 0-10 Pain Score: 10-Worst  pain ever Pain Location: R hip Pain Descriptors / Indicators: Discomfort;Sore;Guarding Pain Intervention(s): Monitored during session;Limited activity within patient's tolerance;Repositioned;Patient requesting pain meds-RN notified     Hand Dominance     Extremity/Trunk Assessment Upper Extremity Assessment Upper Extremity  Assessment: Overall WFL for tasks assessed   Lower Extremity Assessment Lower Extremity Assessment: Defer to PT evaluation   Cervical / Trunk Assessment Cervical / Trunk Assessment: Normal   Communication Communication Communication: No difficulties   Cognition Arousal/Alertness: Awake/alert Behavior During Therapy: WFL for tasks assessed/performed Overall Cognitive Status: Within Functional Limits for tasks assessed                                 General Comments: pt understandably frustrated for being back in the hospital   General Comments  HR up to 144 with activity    Exercises     Shoulder Instructions      Home Living Family/patient expects to be discharged to:: Private residence Living Arrangements: Spouse/significant other Available Help at Discharge: Friend(s);Available 24 hours/day(girlfriend) Type of Home: House Home Access: Stairs to enter [employer] of Steps: 2 Entrance Stairs-Rails: None Home Layout: One level     Bathroom Shower/Tub: Chief Strategy Officer: Standard     Home Equipment: Crutches;Walker - standard;Bedside commode;Shower seat;Tub bench;Walker - 2 wheels          Prior Functioning/Environment Level of Independence: Independent with assistive device(s)                 OT Problem List: Decreased activity tolerance;Impaired balance (sitting and/or standing);Pain;Decreased knowledge of use of DME or AE;Decreased knowledge of precautions      OT Treatment/Interventions: Self-care/ADL training;Therapeutic exercise;DME and/or AE instruction;Therapeutic activities;Patient/family education;Balance training    OT Goals(Current goals can be found in the care plan section) Acute Rehab OT Goals Patient Stated Goal: home OT Goal Formulation: With patient Time For Goal Achievement: 06/01/19 Potential to Achieve Goals: Good  OT Frequency: Min 2X/week   Barriers to D/C:            Co-evaluation  PT/OT/SLP Co-Evaluation/Treatment: Yes Reason for Co-Treatment: For patient/therapist safety;To address functional/ADL transfers   OT goals addressed during session: ADL's and self-care      AM-PAC OT "6 Clicks" Daily Activity     Outcome Measure Help from another person eating meals?: None Help from another person taking care of personal grooming?: None Help from another person toileting, which includes using toliet, bedpan, or urinal?: A Little Help from another person bathing (including washing, rinsing, drying)?: A Little Help from another person to put on and taking off regular upper body clothing?: None Help from another person to put on and taking off regular lower body clothing?: A Lot 6 Click Score: 20   End of Session Equipment Utilized During Treatment: Gait belt;Rolling walker Nurse Communication: Mobility status;Patient requests pain meds  Activity Tolerance: Patient tolerated treatment well Patient left: in chair;with call bell/phone within reach;with chair alarm set  OT Visit Diagnosis: Unsteadiness on feet (R26.81);Pain Pain - Right/Left: Right Pain - part of body: Hip                Time: 0950-1008 OT Time Calculation (min): 18 min Charges:  OT General Charges $OT Visit: 1 Visit OT Evaluation $OT Eval Moderate Complexity: 1 Mod  Richard Lopez, OT [employer] Pager [phone] Office [phone]   Richard Lopez 05/18/2019, 12:50 PM

## 2019-05-18 NOTE — Progress Notes (Signed)
Patient ID: Richard Lopez, male   DOB: 1975-12-02, 43 y.o.   MRN: [id number]  PROGRESS NOTE    Richard Lopez  KXF:[id number] DOB: [birth year] DOA: 05/16/2019 PCP: Kerin Perna, NP   Brief Narrative:  43 year old male with history of adenomatous type II, alcohol abuse presented on 05/16/2019 for right hip pain after a mechanical fall.  He was found to have periprosthetic fracture involving the proximal shaft of the femur.  Orthopedics was consulted.  He underwent surgical intervention on 05/17/2019.  Assessment & Plan:   Principal Problem:   Peri-prosthetic fracture of femur at tip of prosthesis Active Problems:   Type 2 diabetes mellitus with hyperlipidemia (HCC)   Essential hypertension   Marijuana abuse   Alcohol dependence with intoxication (HCC)   Effusion, right knee   Closed fracture of right femur (HCC)   Vitamin D deficiency   Gout  Periprosthetic right femur fracture after a mechanical fall -Status post surgical intervention on 05/17/2019 -Wound care and activity as per orthopedics recommendations.  Pain management/DVT prophylaxis as per orthopedics recommendations -Fall precautions -PT/OT eval  History of alcohol abuse and delirium tremens -Continue CIWA protocol.  Currently no signs of withdrawal.  Continue thiamine, multivitamin and folic acid  Diabetes mellitus type 2 -Continue CBGs with SSI  Hypertension -Monitor blood pressure.  Continue amlodipine and lisinopril.  DVT prophylaxis: Lovenox Code Status: Full Family Communication: None at bedside Disposition Plan: Pending PT/OT eval and orthopedics clearance  Consultants: Orthopedics  Procedures: Intramedullary nailing of the right femur on 05/17/2019  Antimicrobials:  Perioperative biotics  Subjective: Patient seen and examined at bedside.  Complains of right hip pain.  No overnight fever or vomiting.  No worsening shortness of breath.  Objective: Vitals:   05/18/19 0400 05/18/19 0500 05/18/19 0804  05/18/19 1127  BP: (!) 147/86  (!) 145/89 (!) 164/89  Pulse: 72  60 79  Resp: 14  12 15   Temp:   98.4 F (36.9 C) 98.1 F (36.7 C)  TempSrc:   Oral Oral  SpO2: 98%  99% 100%  Weight:  95.3 kg    Height:        Intake/Output Summary (Last 24 hours) at 05/18/2019 1358 Last data filed at 05/18/2019 1023 Gross per 24 hour  Intake 905 ml  Output 1200 ml  Net -295 ml   Filed Weights   05/17/19 0200 05/18/19 0500  Weight: 96.9 kg 95.3 kg    Examination:  General exam: Appears calm and comfortable  Respiratory system: Bilateral decreased breath sounds at bases Cardiovascular system: S1 & S2 heard, Rate controlled Gastrointestinal system: Abdomen is nondistended, soft and nontender. Normal bowel sounds heard. Extremities: No cyanosis, clubbing, edema    Data Reviewed: I have personally reviewed following labs and imaging studies  CBC: Recent Labs  Lab 05/16/19 2002 05/17/19 0335 05/17/19 1323 05/18/19 0314  WBC 5.5 6.1 9.1 7.5  NEUTROABS 1.9  --   --   --   HGB 11.5* 11.0* 10.7* 8.9*  HCT 36.8* 32.3* 32.2* 25.8*  MCV 90.2 84.8 86.3 84.0  PLT 356 312 252 696   Basic Metabolic Panel: Recent Labs  Lab 05/16/19 2002 05/17/19 0335 05/17/19 1323 05/18/19 0314  NA 139 140  --  136  K 3.5 4.0  --  3.6  CL 100 102  --  99  CO2 19* 21*  --  26  GLUCOSE 113* 123*  --  131*  BUN 5* 5*  --  8  CREATININE 0.90 0.82  1.00 0.83  CALCIUM 8.9 8.8*  --  8.1*  MG  --  1.2*  --   --    GFR: Estimated Creatinine Clearance: 151.4 mL/min (by C-G formula based on SCr of 0.83 mg/dL). Liver Function Tests: Recent Labs  Lab 05/18/19 0314  AST 20  ALT 9  ALKPHOS 75  BILITOT 0.7  PROT 5.9*  ALBUMIN 2.8*   No results for input(s): LIPASE, AMYLASE in the last 168 hours. No results for input(s): AMMONIA in the last 168 hours. Coagulation Profile: No results for input(s): INR, PROTIME in the last 168 hours. Cardiac Enzymes: No results for input(s): CKTOTAL, CKMB, CKMBINDEX,  TROPONINI in the last 168 hours. BNP (last 3 results) No results for input(s): PROBNP in the last 8760 hours. HbA1C: No results for input(s): HGBA1C in the last 72 hours. CBG: Recent Labs  Lab 05/17/19 1223 05/17/19 2332 05/18/19 0805  GLUCAP 146* 175* 118*   Lipid Profile: No results for input(s): CHOL, HDL, LDLCALC, TRIG, CHOLHDL, LDLDIRECT in the last 72 hours. Thyroid Function Tests: No results for input(s): TSH, T4TOTAL, FREET4, T3FREE, THYROIDAB in the last 72 hours. Anemia Panel: No results for input(s): VITAMINB12, FOLATE, FERRITIN, TIBC, IRON, RETICCTPCT in the last 72 hours. Sepsis Labs: No results for input(s): PROCALCITON, LATICACIDVEN in the last 168 hours.  Recent Results (from the past 240 hour(s))  SARS Coronavirus 2 (CEPHEID - Performed in [facility] hospital lab), Hosp Order     Status: None   Collection Time: 05/16/19 10:07 PM   Specimen: Nasopharyngeal Swab  Result Value Ref Range Status   SARS Coronavirus 2 NEGATIVE NEGATIVE Final    Comment: (NOTE) If result is NEGATIVE SARS-CoV-2 target nucleic acids are NOT DETECTED. The SARS-CoV-2 RNA is generally detectable in upper and lower  respiratory specimens during the acute phase of infection. The lowest  concentration of SARS-CoV-2 viral copies this assay can detect is 250  copies / mL. A negative result does not preclude SARS-CoV-2 infection  and should not be used as the sole basis for treatment or other  patient management decisions.  A negative result may occur with  improper specimen collection / handling, submission of specimen other  than nasopharyngeal swab, presence of viral mutation(s) within the  areas targeted by this assay, and inadequate number of viral copies  (<250 copies / mL). A negative result must be combined with clinical  observations, patient history, and epidemiological information. If result is POSITIVE SARS-CoV-2 target nucleic acids are DETECTED. The SARS-CoV-2 RNA is generally  detectable in upper and lower  respiratory specimens dur ing the acute phase of infection.  Positive  results are indicative of active infection with SARS-CoV-2.  Clinical  correlation with patient history and other diagnostic information is  necessary to determine patient infection status.  Positive results do  not rule out bacterial infection or co-infection with other viruses. If result is PRESUMPTIVE POSTIVE SARS-CoV-2 nucleic acids MAY BE PRESENT.   A presumptive positive result was obtained on the submitted specimen  and confirmed on repeat testing.  While 2019 novel coronavirus  (SARS-CoV-2) nucleic acids may be present in the submitted sample  additional confirmatory testing may be necessary for epidemiological  and / or clinical management purposes  to differentiate between  SARS-CoV-2 and other Sarbecovirus currently known to infect humans.  If clinically indicated additional testing with an alternate test  methodology [phone]) is advised. The SARS-CoV-2 RNA is generally  detectable in upper and lower respiratory sp ecimens during the acute  phase of infection. The expected result is Negative. Fact Sheet for Patients:  BoilerBrush.com.cyhttps://www.fda.gov/media/136312/download Fact Sheet for Healthcare Providers: https://pope.com/https://www.fda.gov/media/136313/download This test is not yet approved or cleared by the [place] FDA and has been authorized for detection and/or diagnosis of SARS-CoV-2 by FDA under an Emergency Use Authorization (EUA).  This EUA will remain in effect (meaning this test can be used) for the duration of the COVID-19 declaration under Section 564(b)(1) of the Act, 21 U.S.C. section 360bbb-3(b)(1), unless the authorization is terminated or revoked sooner. Performed at [facility], [address] [place], [place] [address]   Surgical pcr screen     Status: None   Collection Time: 05/17/19  1:34 AM   Specimen: Nasal Mucosa; Nasal Swab  Result Value Ref Range Status     MRSA, PCR NEGATIVE NEGATIVE Final   Staphylococcus aureus NEGATIVE NEGATIVE Final    Comment: (NOTE) The Xpert SA Assay (FDA approved for NASAL specimens in patients 42 years of age and older), is one component of a comprehensive surveillance program. It is not intended to diagnose infection nor to guide or monitor treatment. Performed at [facility], [address] [place], [place] [address]          Radiology Studies: Dg Pelvis Portable  Result Date: 05/16/2019 CLINICAL DATA:  Fall history of IM nail right femur EXAM: PORTABLE PELVIS 1-2 VIEWS COMPARISON:  03/25/2019, CT 04/12/2019 FINDINGS: Pubic symphysis and rami are intact. Mild arthritis left hip. Left femoral head projects in joint. Incompletely visualized intramedullary rod and fixating screws in the proximal right femur. Prior intertrochanteric fracture with callus about the fracture site. IMPRESSION: 1. Prior intramedullary rod and screw fixation of proximal right femur for intertrochanteric and subtrochanteric fracture, incompletely visualized. 2. No acute osseous abnormality of the left hip Electronically Signed   By: Jasmine PangKim  Fujinaga M.D.   On: 05/16/2019 21:14   Ct Hip Right Wo Contrast  Result Date: 05/16/2019 CLINICAL DATA: Recent fracture with internal fixation.  New fall. EXAM: CT OF THE RIGHT HIP WITHOUT CONTRAST TECHNIQUE: Multidetector CT imaging of the right hip was performed according to the standard protocol. Multiplanar CT image reconstructions were also generated. COMPARISON:  Plain films April 10, 2019.  CT April 11, 2019. FINDINGS: Previously seen fracture through the right femur intertrochanteric and subtrochanteric regions are again noted. Femoral neck screws and intramedullary rod noted in place. There is extensive callus formation but fracture line remains evident. In addition, there is a new displaced fracture through the proximal femoral shaft at the level of the intramedullary nail. The distal fragment  is displaced anteriorly greater than 1 shaft width with overlapping fracture fragments. No subluxation or dislocation. IMPRESSION: Healing right femoral intertrochanteric and subtrochanteric fractures with hardware in place. Extensive callus formation noted, but fracture line remains evident. New displaced and overlapping proximal femoral shaft fracture at the level of the intramedullary nail. Distal fragment is displaced anteriorly greater than 1 shaft width. Electronically Signed   By: Charlett NoseKevin  Dover M.D.   On: 05/16/2019 23:06   Dg Chest Port 1 View  Result Date: 05/16/2019 CLINICAL DATA:  Preop, fall EXAM: PORTABLE CHEST 1 VIEW COMPARISON:  04/09/2019 FINDINGS: No focal airspace disease or effusion. Normal cardiomediastinal silhouette. No pneumothorax. IMPRESSION: No active disease. Electronically Signed   By: Jasmine PangKim  Fujinaga M.D.   On: 05/16/2019 21:10   Dg C-arm 1-60 Min  Result Date: 05/17/2019 CLINICAL DATA:  RIGHT femoral nail. EXAM: RIGHT FEMUR 2 VIEWS; DG C-ARM 61-120 MIN  COMPARISON:  Plain film of the RIGHT femur dated 05/16/2019. FINDINGS: Ten intraoperative fluoroscopic images are provided demonstrating intramedullary rod placement traversing the proximal RIGHT femur fracture site. Osseous alignment appears anatomic. Hardware appears intact and appropriately positioned, fixed with screws and cerclage wire. Dynamic hip screws also appear intact and appropriately positioned. Fluoroscopy provided for 1 minutes 41 seconds. IMPRESSION: Intraoperative fluoroscopic images demonstrating intramedullary rod placement traversing the proximal RIGHT femur fracture site. Hardware appears intact and appropriately positioned. No evidence of surgical complicating feature. Electronically Signed   By: Bary Richard M.D.   On: 05/17/2019 11:45   Dg Femur, Min 2 Views Right  Result Date: 05/17/2019 CLINICAL DATA:  RIGHT femoral nail. EXAM: RIGHT FEMUR 2 VIEWS; DG C-ARM 61-120 MIN COMPARISON:  Plain film of the  RIGHT femur dated 05/16/2019. FINDINGS: Ten intraoperative fluoroscopic images are provided demonstrating intramedullary rod placement traversing the proximal RIGHT femur fracture site. Osseous alignment appears anatomic. Hardware appears intact and appropriately positioned, fixed with screws and cerclage wire. Dynamic hip screws also appear intact and appropriately positioned. Fluoroscopy provided for 1 minutes 41 seconds. IMPRESSION: Intraoperative fluoroscopic images demonstrating intramedullary rod placement traversing the proximal RIGHT femur fracture site. Hardware appears intact and appropriately positioned. No evidence of surgical complicating feature. Electronically Signed   By: Bary Richard M.D.   On: 05/17/2019 11:45   Dg Femur Port, Min 2 Views Right  Result Date: 05/17/2019 CLINICAL DATA:  Status post ORIF of right femoral fracture EXAM: RIGHT FEMUR PORTABLE 2 VIEW COMPARISON:  05/16/2019 FINDINGS: Previously seen medullary rod has been extended into the distal femur with distal fixation screws. Cerclage wire is noted surrounding the fracture proximally. The proximal fixation screws show removal of 1 of the screws. The remaining 2 screws are within normal limits. IMPRESSION: Status post ORIF of right femoral fracture. Electronically Signed   By: Alcide Clever M.D.   On: 05/17/2019 13:17   Dg Femur Portable Min 2 Views Right  Result Date: 05/16/2019 CLINICAL DATA:  Fall EXAM: RIGHT FEMUR PORTABLE 2 VIEW COMPARISON:  CT 04/12/2019, 04/11/2019, 04/10/2019, femur radiograph 03/25/2019 FINDINGS: Vascular calcifications within the soft tissues. Prior intramedullary rod a directed screws within the right femur for intertrochanteric and subtrochanteric fracture. Callus formation is noted. Acute oblique fracture proximal diaphysis of the femur, involves the distal stem of the femoral prosthesis. Mild varus angulation of distal fracture fragment and slightly greater than 1/2 bone with of lateral  displacement of distal fracture fragment. IMPRESSION: 1. Acute displaced and slightly angulated periprosthetic fracture involving the proximal shaft of the femur 2. Prior intramedullary rod and screw fixation of right femur for intertrochanteric fracture Electronically Signed   By: Jasmine Pang M.D.   On: 05/16/2019 21:17        Scheduled Meds:  amLODipine  10 mg Oral Daily   cholecalciferol  2,000 Units Oral BID   docusate sodium  100 mg Oral BID   enoxaparin (LOVENOX) injection  40 mg Subcutaneous Q24H   folic acid  1 mg Oral Daily   lisinopril  10 mg Oral Daily   multivitamin with minerals  1 tablet Oral Daily   pantoprazole  40 mg Oral BID   sodium chloride flush  3 mL Intravenous Q12H   thiamine  100 mg Oral Daily   Continuous Infusions:  lactated ringers       LOS: 2 days        Glade Lloyd, MD [facility] 05/18/2019, 1:58 PM

## 2019-05-18 NOTE — Progress Notes (Signed)
Orthopedic Trauma Service Progress Note  Patient ID: Richard Lopez MRN: [id number] DOB/AGE: [birth year] 43 y.o.  Subjective:  Appears to be doing ok  No acute issues of note other than some mild pain   Reports chronic R knee effusion   Aspirated 45 cc translucent straw colored fluid in OR yesterday   Synovial fluid analysis show MSU crystals    Review of Systems  Constitutional: Negative for chills and fever.  Respiratory: Negative for shortness of breath and wheezing.   Cardiovascular: Negative for chest pain and palpitations.  Gastrointestinal: Negative for nausea and vomiting.  Neurological: Negative for tingling and sensory change.    Objective:   VITALS:   Vitals:   05/17/19 2325 05/18/19 0400 05/18/19 0500 05/18/19 0804  BP: (!) 148/95 (!) 147/86  (!) 145/89  Pulse: 74 72  60  Resp: 15 14  12   Temp: 98.8 F (37.1 C)   98.4 F (36.9 C)  TempSrc: Oral   Oral  SpO2: 98% 98%  99%  Weight:   95.3 kg   Height:        Estimated body mass index is 24.28 kg/m as calculated from the following:   Height as of this encounter: 6\' 6"  (1.981 m).   Weight as of this encounter: 95.3 kg.   Intake/Output      07/14 0701 - 07/15 0700 07/15 0701 - 07/16 0700   P.O. 702    I.V. (mL/kg) 1500 (15.7)    Other 50    IV Piggyback 700    Total Intake(mL/kg) 2952 (31)    Urine (mL/kg/hr) 1385 (0.6)    Other     Blood 250    Total Output 1635    Net +1317           LABS  Results for orders placed or performed during the hospital encounter of 05/16/19 (from the past 24 hour(s))  Synovial cell count + diff, w/ crystals     Status: Abnormal   Collection Time: 05/17/19 11:26 AM  Result Value Ref Range   Color, Synovial STRAW (A) YELLOW   Appearance-Synovial CLEAR CLEAR   Crystals, Fluid EXTRACELLULAR MONOSODIUM URATE CRYSTALS    WBC, Synovial 128 0 - 200 /cu mm   Neutrophil, Synovial 17 0 - 25 %   Lymphocytes-Synovial Fld 25 (H) 0 - 20 %   Monocyte-Macrophage-Synovial Fluid 58 50 - 90 %   Eosinophils-Synovial 0 0 - 1 %  Glucose, capillary     Status: Abnormal   Collection Time: 05/17/19 12:23 PM  Result Value Ref Range   Glucose-Capillary 146 (H) 70 - 99 mg/dL  CBC     Status: Abnormal   Collection Time: 05/17/19  1:23 PM  Result Value Ref Range   WBC 9.1 4.0 - 10.5 K/uL   RBC 3.73 (L) 4.22 - 5.81 MIL/uL   Hemoglobin 10.7 (L) 13.0 - 17.0 g/dL   HCT 88.1 (L) 10.3 - 15.9 %   MCV 86.3 80.0 - 100.0 fL   MCH 28.7 26.0 - 34.0 pg   MCHC 33.2 30.0 - 36.0 g/dL   RDW 45.8 59.2 - 92.4 %   Platelets 252 150 - 400 K/uL   nRBC 0.0 0.0 - 0.2 %  Creatinine, serum     Status: None   Collection Time: 05/17/19  1:23  PM  Result Value Ref Range   Creatinine, Ser 1.00 0.61 - 1.24 mg/dL   GFR calc non Af Amer >60 >60 mL/min   GFR calc Af Amer >60 >60 mL/min  Glucose, capillary     Status: Abnormal   Collection Time: 05/17/19 11:32 PM  Result Value Ref Range   Glucose-Capillary 175 (H) 70 - 99 mg/dL  CBC     Status: Abnormal   Collection Time: 05/18/19  3:14 AM  Result Value Ref Range   WBC 7.5 4.0 - 10.5 K/uL   RBC 3.07 (L) 4.22 - 5.81 MIL/uL   Hemoglobin 8.9 (L) 13.0 - 17.0 g/dL   HCT 25.8 (L) 39.0 - 52.0 %   MCV 84.0 80.0 - 100.0 fL   MCH 29.0 26.0 - 34.0 pg   MCHC 34.5 30.0 - 36.0 g/dL   RDW 14.9 11.5 - 15.5 %   Platelets 213 150 - 400 K/uL   nRBC 0.0 0.0 - 0.2 %  Comprehensive metabolic panel     Status: Abnormal   Collection Time: 05/18/19  3:14 AM  Result Value Ref Range   Sodium 136 135 - 145 mmol/L   Potassium 3.6 3.5 - 5.1 mmol/L   Chloride 99 98 - 111 mmol/L   CO2 26 22 - 32 mmol/L   Glucose, Bld 131 (H) 70 - 99 mg/dL   BUN 8 6 - 20 mg/dL   Creatinine, Ser 0.83 0.61 - 1.24 mg/dL   Calcium 8.1 (L) 8.9 - 10.3 mg/dL   Total Protein 5.9 (L) 6.5 - 8.1 g/dL   Albumin 2.8 (L) 3.5 - 5.0 g/dL   AST 20 15 - 41 U/L   ALT 9 0 - 44 U/L   Alkaline Phosphatase 75 38 - 126 U/L    Total Bilirubin 0.7 0.3 - 1.2 mg/dL   GFR calc non Af Amer >60 >60 mL/min   GFR calc Af Amer >60 >60 mL/min   Anion gap 11 5 - 15  Glucose, capillary     Status: Abnormal   Collection Time: 05/18/19  8:05 AM  Result Value Ref Range   Glucose-Capillary 118 (H) 70 - 99 mg/dL     PHYSICAL EXAM:    Gen: resting comfortably in bed, NAD, appears well for POD 1  Lungs: breathing unlabored Cardiac: regular  Ext:       Right lower Extremity   Dressings stable   scant drainage noted on dressings  Ext warm  Minimal swelling distally   + DP pulse  Decreased knee effusion   EHL, FHL, lesser toe motor intact  Ankle flex, ext, inv and evr intact  + quad set  Significant muscle wasting/atropny noted of quad muscles  No DCT   Compartments are soft    Assessment/Plan: 1 Day Post-Op   Principal Problem:   Peri-prosthetic fracture of femur at tip of prosthesis Active Problems:   Type 2 diabetes mellitus with hyperlipidemia (HCC)   Essential hypertension   Marijuana abuse   Alcohol dependence with intoxication (HCC)   Effusion, right knee   Closed fracture of right femur (HCC)   Vitamin D deficiency   Gout   Anti-infectives (From admission, onward)   Start     Dose/Rate Route Frequency Ordered Stop   05/17/19 1430  ceFAZolin (ANCEF) IVPB 2g/100 mL premix     2 g 200 mL/hr over 30 Minutes Intravenous Every 6 hours 05/17/19 1304 05/18/19 0300    .  POD/HD#: 1  43 y/o male, chronic alcoholic  with acute R femur peri-implant fracture   -fall  - R femoral shaft fracture, peri-implant   NWB x 6 weeks  Unrestricted ROM R hip and knee  PT/OT  Ice prn   Dressing change tomorrow   - Pain management:  Continue with current regimen   Tylenol and Oxy IR    LFTs look good   Morphine IV for severe breakthrough pain    No robaxin as pt on ativan protocol    - ABL anemia/Hemodynamics  Monitor  CBC in am   May require transfusion   - Medical issues   Per Hospitalists     EtOH dependence   On CIWA protocol    Gout   Synovial fluid analysis of R knee shows MSU crystals    Pt was not reporting any knee pain prior to his fall and in fact states the swelling is chronic    Alcohol likely precipitating factor    No steroid injected into knee in OR at time of aspiration    Can see how he progresses with therapy.  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   Recheck vitamin d   Per med rec pt stopped vitamin D on his own   Pt likely to be noncompliant with any intervention we recommend for maximizing his bone health   - Activity:  NWB R leg  Unrestricted ROM R hip and knee  - FEN/GI prophylaxis/Foley/Lines:  Carb mod diet  - Impediments to fracture healing:  EtOH abuses  Marijuana use   General noncompliance    - Dispo:  PT/OT evals  Monitor CBC  Continue with inpatient care   Weightbearing: NWB RLE Insicional and dressing care: Daily dressing changes with mepilex starting on 05/19/2019 Orthopedic device(s): walker or crutches Showering: ok to shower  VTE prophylaxis: Lovenox 40mg  qd 21 days post op  Pain control: tylenol, Oxy IR Follow - up plan: 2 weeks Contact information:  MD, Myrene Galas PA-C   Montez Morita, PA-C [phone] (C) 05/18/2019, 9:20 AM  [facility] [address] [place] [phone] (F)

## 2019-05-18 NOTE — Progress Notes (Signed)
Orthopedic Tech Progress Note Patient Details:  Richard Lopez 05-14-1976 [id number] Applied over head frame with trapeze  Patient ID: Richard Lopez, male   DOB: 09-Dec-1975, 43 y.o.   MRN: [id number]   Richard Lopez 05/18/2019, 9:31 AM

## 2019-05-19 ENCOUNTER — Encounter (HOSPITAL_COMMUNITY): Payer: Self-pay | Admitting: Orthopedic Surgery

## 2019-05-19 LAB — CBC
HCT: 27.5 % — ABNORMAL LOW (ref 39.0–52.0)
Hemoglobin: 9.1 g/dL — ABNORMAL LOW (ref 13.0–17.0)
MCH: 28.7 pg (ref 26.0–34.0)
MCHC: 33.1 g/dL (ref 30.0–36.0)
MCV: 86.8 fL (ref 80.0–100.0)
Platelets: 182 10*3/uL (ref 150–400)
RBC: 3.17 MIL/uL — ABNORMAL LOW (ref 4.22–5.81)
RDW: 15.2 % (ref 11.5–15.5)
WBC: 7 10*3/uL (ref 4.0–10.5)
nRBC: 0 % (ref 0.0–0.2)

## 2019-05-19 LAB — RAPID URINE DRUG SCREEN, HOSP PERFORMED
Amphetamines: NOT DETECTED
Barbiturates: NOT DETECTED
Benzodiazepines: POSITIVE — AB
Cocaine: NOT DETECTED
Opiates: POSITIVE — AB
Tetrahydrocannabinol: POSITIVE — AB

## 2019-05-19 LAB — BASIC METABOLIC PANEL
Anion gap: 14 (ref 5–15)
BUN: <5 mg/dL — ABNORMAL LOW (ref 6–20)
CO2: 26 mmol/L (ref 22–32)
Calcium: 8.1 mg/dL — ABNORMAL LOW (ref 8.9–10.3)
Chloride: 98 mmol/L (ref 98–111)
Creatinine, Ser: 0.72 mg/dL (ref 0.61–1.24)
GFR calc Af Amer: >60 mL/min (ref >60)
GFR calc non Af Amer: >60 mL/min (ref >60)
Glucose, Bld: 100 mg/dL — ABNORMAL HIGH (ref 70–99)
Potassium: 2.9 mmol/L — ABNORMAL LOW (ref 3.5–5.1)
Sodium: 138 mmol/L (ref 135–145)

## 2019-05-19 LAB — GLUCOSE, CAPILLARY
Glucose-Capillary: 108 mg/dL — ABNORMAL HIGH (ref 70–99)
Glucose-Capillary: 125 mg/dL — ABNORMAL HIGH (ref 70–99)
Glucose-Capillary: 126 mg/dL — ABNORMAL HIGH (ref 70–99)
Glucose-Capillary: 243 mg/dL — ABNORMAL HIGH (ref 70–99)

## 2019-05-19 LAB — VITAMIN D 25 HYDROXY (VIT D DEFICIENCY, FRACTURES): Vit D, 25-Hydroxy: 31.7 ng/mL (ref 30.0–100.0)

## 2019-05-19 LAB — MAGNESIUM: Magnesium: 1.1 mg/dL — ABNORMAL LOW (ref 1.7–2.4)

## 2019-05-19 LAB — URIC ACID: Uric Acid, Serum: 8.7 mg/dL — ABNORMAL HIGH (ref 3.7–8.6)

## 2019-05-19 MED ORDER — ENOXAPARIN (LOVENOX) PATIENT EDUCATION KIT
PACK | Freq: Once | Status: DC
  Filled 2019-05-19: qty 1

## 2019-05-19 MED ORDER — FOLIC ACID 1 MG PO TABS: 1.0000 mg | ORAL_TABLET | Freq: Every day | ORAL | 0 refills | Status: DC

## 2019-05-19 MED ORDER — ADULT MULTIVITAMIN W/MINERALS CH: 1.0000 | ORAL_TABLET | Freq: Every day | ORAL | Status: DC

## 2019-05-19 MED ORDER — VITAMIN D 25 MCG (1000 UNIT) PO TABS: 2000.0000 [IU] | ORAL_TABLET | Freq: Two times a day (BID) | ORAL | 0 refills | Status: DC

## 2019-05-19 MED ORDER — OXYCODONE-ACETAMINOPHEN 5-325 MG PO TABS: 1.0000 | ORAL_TABLET | Freq: Four times a day (QID) | ORAL | 0 refills | Status: DC | PRN

## 2019-05-19 MED ORDER — ENOXAPARIN SODIUM 40 MG/0.4ML ~~LOC~~ SOLN: 40.0000 mg | SUBCUTANEOUS | 0 refills | Status: DC

## 2019-05-19 MED ORDER — THIAMINE HCL 100 MG PO TABS: 100.0000 mg | ORAL_TABLET | Freq: Every day | ORAL | 0 refills | Status: DC

## 2019-05-19 MED ORDER — MAGNESIUM SULFATE 4 GM/100ML IV SOLN
4.0000 g | Freq: Once | INTRAVENOUS | Status: AC
  Administered 2019-05-19: 10:00:00 4 g via INTRAVENOUS
  Filled 2019-05-19: qty 100

## 2019-05-19 MED ORDER — DOCUSATE SODIUM 100 MG PO CAPS: 100.0000 mg | ORAL_CAPSULE | Freq: Two times a day (BID) | ORAL | 0 refills | Status: DC

## 2019-05-19 MED ORDER — LISINOPRIL 10 MG PO TABS: 10.0000 mg | ORAL_TABLET | Freq: Every day | ORAL | 0 refills | Status: DC

## 2019-05-19 MED ORDER — POTASSIUM CHLORIDE CRYS ER 20 MEQ PO TBCR
40.0000 meq | EXTENDED_RELEASE_TABLET | ORAL | Status: AC
  Administered 2019-05-19: 10:00:00 40 meq via ORAL
  Filled 2019-05-19: qty 2

## 2019-05-19 MED ORDER — ENOXAPARIN (LOVENOX) PATIENT EDUCATION KIT: 1.0000 | PACK | Freq: Once | 0 refills | Status: AC

## 2019-05-19 MED FILL — OXYCODONE W/APAP 5/325 TAB: 5-325 | 7 days supply | Qty: 56 | Fill #0

## 2019-05-19 MED FILL — VITAMIN D3 1,000 UNIT TAB: 25 MCG | 8 days supply | Qty: 30 | Fill #0

## 2019-05-19 MED FILL — ENOXAPARIN 40 MG/0.4 ML SYR: 40 | 12 days supply | Qty: 5 | Fill #0

## 2019-05-19 MED FILL — VITAMIN B-1 100 MG TABS: 100 | 30 days supply | Qty: 30 | Fill #0

## 2019-05-19 MED FILL — LISINOPRIL 10 MG TABS: 10 | 30 days supply | Qty: 30 | Fill #0

## 2019-05-19 MED FILL — DOK 100 MG CAPS: 100 | 10 days supply | Qty: 20 | Fill #0

## 2019-05-19 MED FILL — FOLIC ACID 1 MG TABS: 1 | 30 days supply | Qty: 30 | Fill #0

## 2019-05-19 NOTE — Progress Notes (Signed)
Physical Therapy Treatment & Discharge Patient Details Name: Richard Lopez MRN: [id number] DOB: 01/18/1976 Today's Date: 05/19/2019    History of Present Illness Pt is a 42 y.o. male admitted 05/16/19 after fall at home sustaining periprosthetic R femur fx, complicated by acute alcohol intoxication. Now s/p R femoral IM nailing with removal of hardware 7/14. Of note, pt with previous admission 03/2019 with R hip fx and 04/2019 with screw loosening. PMH includes ETOH abuse, HTN, DM, gout.   PT Comments    Pt progressing well with mobility; seems to be in better spirits today. Mod indep with transfers and ambulation using RW; declined stair training but able to state correct technique with crutches. Reviewed importance of RLE NWB precautions, fall risk reduction. No DME or follow-up PT needs. Pt has met short-term acute PT goals. Will d/c PT.   Follow Up Recommendations  No PT follow up;Supervision for mobility/OOB     Equipment Recommendations  None recommended by PT    Recommendations for Other Services       Precautions / Restrictions Precautions Precautions: Fall Restrictions Weight Bearing Restrictions: Yes RLE Weight Bearing: Non weight bearing    Mobility  Bed Mobility Overal bed mobility: Independent             General bed mobility comments: Bed flat  Transfers   Equipment used: Rolling walker (2 wheeled) Transfers: Sit to/from Stand Sit to Stand: Modified independent (Device/Increase time)            Ambulation/Gait Ambulation/Gait assistance: Modified independent (Device/Increase time) Gait Distance (Feet): 100 Feet Assistive device: Rolling walker (2 wheeled)   Gait velocity: Decreased Gait velocity interpretation: <1.8 ft/sec, indicate of risk for recurrent falls General Gait Details: Slow, antalgic gait with RW utilizing hop-to technique on LLE; intermittent cues for RLE NWB as pt resting foot on ground in between steps, but continues to state no  weight going throughout RLE.   Stairs Stairs: (Pt declined; able to state correct technique with crutch and rail, reports he feels confident doing this)           Wheelchair Mobility    Modified [place] (Stroke Patients Only)       Balance Overall balance assessment: Needs assistance Sitting-balance support: Feet supported Sitting balance-Leahy Scale: Good     Standing balance support: Bilateral upper extremity supported Standing balance-Leahy Scale: Poor Standing balance comment: reliant on UE support to maintain NWB                            Cognition Arousal/Alertness: Awake/alert Behavior During Therapy: Flat affect Overall Cognitive Status: Within Functional Limits for tasks assessed                                        Exercises      General Comments        Pertinent Vitals/Pain Pain Assessment: Faces Faces Pain Scale: Hurts little more Pain Location: R hip Pain Descriptors / Indicators: Discomfort;Sore;Guarding Pain Intervention(s): Monitored during session    Home Living                      Prior Function            PT Goals (current goals can now be found in the care plan section) Progress towards PT goals: Goals met/education completed, patient discharged from   PT    Frequency    Min 5X/week      PT Plan Current plan remains appropriate    Co-evaluation              AM-PAC PT "6 Clicks" Mobility   Outcome Measure  Help needed turning from your back to your side while in a flat bed without using bedrails?: None Help needed moving from lying on your back to sitting on the side of a flat bed without using bedrails?: None Help needed moving to and from a bed to a chair (including a wheelchair)?: None Help needed standing up from a chair using your arms (e.g., wheelchair or bedside chair)?: None Help needed to walk in hospital room?: None Help needed climbing 3-5 steps with a railing? : A  Little 6 Click Score: 23    End of Session   Activity Tolerance: Patient tolerated treatment well Patient left: in chair;with call bell/phone within reach;with nursing/sitter in room Nurse Communication: Mobility status PT Visit Diagnosis: Other abnormalities of gait and mobility (R26.89);Pain Pain - Right/Left: Right Pain - part of body: Leg     Time: 9381-8299 PT Time Calculation (min) (ACUTE ONLY): 12 min  Charges:  $Gait Training: 8-22 mins                    Mabeline Caras, PT, DPT Acute Rehabilitation Services  Pager [phone] Office [place] 05/19/2019, 12:42 PM

## 2019-05-19 NOTE — Progress Notes (Signed)
Orthopedic Trauma Service Progress Note  Patient ID: Richard Lopez MRN: [id number] DOB/AGE: Oct 28, 1976 43 y.o.  Subjective:  Doing ok  Had some dizziness earlier this pm, noted to be hyperglycemic  Pt reports longstanding history of gout and RA though not reported on his history. Was on meds remotely for his gout and never on meds for RA.   Pain tolerable R femur  Currently resting in bed and doing ok   ROS As above  Objective:   VITALS:   Vitals:   05/19/19 0400 05/19/19 0427 05/19/19 0803 05/19/19 1148  BP: 133/81 133/81 (!) 133/99 118/80  Pulse: 80 80 100 79  Resp:  18    Temp:  98.1 F (36.7 C) 97.8 F (36.6 C) 98 F (36.7 C)  TempSrc:  Oral Axillary Oral  SpO2:  98% 97% 100%  Weight:      Height:        Estimated body mass index is 24.28 kg/m as calculated from the following:   Height as of this encounter: 6\' 6"  (1.981 m).   Weight as of this encounter: 95.3 kg.   Intake/Output      07/15 0701 - 07/16 0700 07/16 0701 - 07/17 0700   P.O.  240   I.V. (mL/kg) 3 (0) 3 (0)   Other     IV Piggyback     Total Intake(mL/kg) 3 (0) 243 (2.5)   Urine (mL/kg/hr) 6900 (3)    Blood     Total Output 6900    Net -6897 +243          LABS  Results for orders placed or performed during the hospital encounter of 05/16/19 (from the past 24 hour(s))  Glucose, capillary     Status: Abnormal   Collection Time: 05/18/19  4:25 PM  Result Value Ref Range   Glucose-Capillary 134 (H) 70 - 99 mg/dL  Urine rapid drug screen (hosp performed)     Status: Abnormal   Collection Time: 05/18/19 11:44 PM  Result Value Ref Range   Opiates POSITIVE (A) NONE DETECTED   Cocaine NONE DETECTED NONE DETECTED   Benzodiazepines POSITIVE (A) NONE DETECTED   Amphetamines NONE DETECTED NONE DETECTED   Tetrahydrocannabinol POSITIVE (A) NONE DETECTED   Barbiturates NONE DETECTED NONE DETECTED  Glucose, capillary      Status: Abnormal   Collection Time: 05/19/19 12:20 AM  Result Value Ref Range   Glucose-Capillary 126 (H) 70 - 99 mg/dL  CBC     Status: Abnormal   Collection Time: 05/19/19  5:06 AM  Result Value Ref Range   WBC 7.0 4.0 - 10.5 K/uL   RBC 3.17 (L) 4.22 - 5.81 MIL/uL   Hemoglobin 9.1 (L) 13.0 - 17.0 g/dL   HCT 37.2 (L) 90.2 - 11.1 %   MCV 86.8 80.0 - 100.0 fL   MCH 28.7 26.0 - 34.0 pg   MCHC 33.1 30.0 - 36.0 g/dL   RDW 55.2 08.0 - 22.3 %   Platelets 182 150 - 400 K/uL   nRBC 0.0 0.0 - 0.2 %  Basic metabolic panel     Status: Abnormal   Collection Time: 05/19/19  5:06 AM  Result Value Ref Range   Sodium 138 135 - 145 mmol/L   Potassium 2.9 (L) 3.5 - 5.1 mmol/L   Chloride 98  98 - 111 mmol/L   CO2 26 22 - 32 mmol/L   Glucose, Bld 100 (H) 70 - 99 mg/dL   BUN <5 (L) 6 - 20 mg/dL   Creatinine, Ser 3.71 0.61 - 1.24 mg/dL   Calcium 8.1 (L) 8.9 - 10.3 mg/dL   GFR calc non Af Amer >60 >60 mL/min   GFR calc Af Amer >60 >60 mL/min   Anion gap 14 5 - 15  Magnesium     Status: Abnormal   Collection Time: 05/19/19  5:06 AM  Result Value Ref Range   Magnesium 1.1 (L) 1.7 - 2.4 mg/dL  Uric acid     Status: Abnormal   Collection Time: 05/19/19  5:06 AM  Result Value Ref Range   Uric Acid, Serum 8.7 (H) 3.7 - 8.6 mg/dL  Glucose, capillary     Status: Abnormal   Collection Time: 05/19/19  8:00 AM  Result Value Ref Range   Glucose-Capillary 108 (H) 70 - 99 mg/dL  Glucose, capillary     Status: Abnormal   Collection Time: 05/19/19  1:08 PM  Result Value Ref Range   Glucose-Capillary 243 (H) 70 - 99 mg/dL     PHYSICAL EXAM:   Gen: resting comfortably in bed, NAD, appears well for POD 1  Ext:       Right lower Extremity              Dressings removed  All wounds look great   No signs of infection    Scant drainage from mid lateral thigh incision              Ext warm             Minimal swelling distally              + DP pulse             Decreased knee effusion               EHL, FHL, lesser toe motor intact             Ankle flex, ext, inv and evr intact             + quad set             Significant muscle wasting/atropny noted of quad muscles             No DCT              Compartments are soft   Assessment/Plan: 2 Days Post-Op   Principal Problem:   Peri-prosthetic fracture of femur at tip of prosthesis Active Problems:   Type 2 diabetes mellitus with hyperlipidemia (HCC)   Essential hypertension   Marijuana abuse   Alcohol dependence with intoxication (HCC)   Effusion, right knee   Closed fracture of right femur (HCC)   Vitamin D deficiency   Gout   Anti-infectives (From admission, onward)   Start     Dose/Rate Route Frequency Ordered Stop   05/17/19 1430  ceFAZolin (ANCEF) IVPB 2g/100 mL premix     2 g 200 mL/hr over 30 Minutes Intravenous Every 6 hours 05/17/19 1304 05/18/19 0300    .  POD/HD#: 2  43 y/o male, chronic alcoholic with acute R femur peri-implant fracture    -fall   - R femoral shaft fracture, peri-implant              NWB x 6 weeks  Unrestricted ROM R hip and knee             PT/OT             Ice prn              Dressing changed today    New gauze placed over incisions   Distal most stab incisions left open to air    Can change again in 48 hours, leave open to air if no drainage. Otherwise cover with 4x4s and tape    Ok to shower and clean wounds with soap and water only    - Pain management:             dc home with percocet   rx sent to [place]    - ABL anemia/Hemodynamics             stable   - Medical issues              Per Hospitalists                EtOH dependence                         On CIWA protocol                Gout and RA                         apparently chronic   Added these to history     - DVT/PE prophylaxis:             Lovenox  30 days of lovenox at [place] program   Rx sent to [facility]               - ID:              perio abx completed    -  Metabolic Bone Disease:             Long standing vitamin d deficiency 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 Leave open to air once drainage stops  Orthopedic device(s): walker or crutches Showering: ok to shower  VTE prophylaxis: Lovenox 40mg  qd 30 days Pain control: tylenol, Oxy IR Follow - up plan: 3  weeks Contact information:  Myrene GalasMichael Handy MD, Montez MoritaKeith Zaire Levesque PA-C  Mearl LatinKeith W. Jeston Junkins, PA-C [phone] (C) 05/19/2019, 2:58 PM  [facility] [address] [place] [address] [phone](O) [phone] (F)

## 2019-05-19 NOTE — Discharge Summary (Signed)
Physician Discharge Summary  Richard Lopez IAX:[id number] DOB: 1976/04/11 DOA: 05/16/2019  PCP: Kerin Perna, NP  Admit date: 05/16/2019 Discharge date: 05/19/2019  Admitted From: Home Disposition: Home  Recommendations for Outpatient Follow-up:  1. Follow up with PCP in 1 week  2. Outpatient follow-up with orthopedics.  Activity/wound care as per orthopedics recommendations 3. Follow up in ED if symptoms worsen or new appear   Home Health: No Equipment/Devices: None  Discharge Condition: Stable CODE STATUS: Full Diet recommendation: Heart healthy/carb modified  Brief/Interim Summary: 43 year old male with history of adenomatous type II, alcohol abuse presented on 05/16/2019 for right hip pain after a mechanical fall.  He was found to have periprosthetic fracture involving the proximal shaft of the femur.  Orthopedics was consulted.  He underwent surgical intervention on 05/17/2019.  Postoperatively, he has tolerated physical therapy.  He will be discharged home today if cleared by orthopedic surgery.  Discharge Diagnoses:   Periprosthetic right femur fracture after a mechanical fall -Status post surgical intervention on 05/17/2019 -Wound care and activity as per orthopedics recommendations.  Pain management/DVT prophylaxis as per orthopedics recommendations -Patient has tolerated physical therapy.  Discharge home today if okay with orthopedics.  History of alcohol abuse and delirium tremens -Currently on CIWA protocol.  Currently no signs of withdrawal.  Continue thiamine, multivitamin and folic acid.  Outpatient follow-up.  Diabetes mellitus type 2 with hyperglycemia -Outpatient follow-up with PCP.  Hypertension -Monitor blood pressure.  Continue amlodipine and lisinopril.   Discharge Instructions  Discharge Instructions    Diet - low sodium heart healthy   Complete by: As directed    Increase activity slowly   Complete by: As directed      Allergies as of  05/19/2019   No Known Allergies     Medication List    STOP taking these medications   Melatonin 5 MG Caps   pantoprazole 40 MG tablet Commonly known as: Protonix   senna-docusate 8.6-50 MG tablet Commonly known as: Senokot-S     TAKE these medications   amLODipine 10 MG tablet Commonly known as: NORVASC Take 1 tablet (10 mg total) by mouth daily.   cholecalciferol 25 MCG (1000 UT) tablet Commonly known as: VITAMIN D3 Take 2 tablets (2,000 Units total) by mouth 2 (two) times daily.   docusate sodium 100 MG capsule Commonly known as: COLACE Take 1 capsule (100 mg total) by mouth 2 (two) times daily.   enoxaparin 40 MG/0.4ML injection Commonly known as: LOVENOX Inject 0.4 mLs (40 mg total) into the skin daily. Start taking on: May 20, 2019   enoxaparin Kit Commonly known as: LOVENOX 1 kit by Does not apply route once for 1 dose.   folic acid 1 MG tablet Commonly known as: FOLVITE Take 1 tablet (1 mg total) by mouth daily. Start taking on: May 20, 2019   lisinopril 10 MG tablet Commonly known as: ZESTRIL Take 1 tablet (10 mg total) by mouth daily.   multivitamin with minerals Tabs tablet Take 1 tablet by mouth daily. Start taking on: May 20, 2019   oxyCODONE-acetaminophen 5-325 MG tablet Commonly known as: Percocet Take 1-2 tablets by mouth every 6 (six) hours as needed for severe pain.   thiamine 100 MG tablet Take 1 tablet (100 mg total) by mouth daily.      Follow-up Information    Altamese Roseland, MD. Schedule an appointment as soon as possible for a visit in 3 week(s).   Specialty: Orthopedic Surgery Contact information: [address] [place] [address] [phone]        Kerin Perna, NP. Schedule an appointment as soon as possible for a visit in 1 week(s).   Specialty: Internal Medicine Contact information: [place] [address] [phone]          No Known  Allergies  Consultations: Orthopedics  Procedures/Studies: Dg Pelvis Portable  Result Date: 05/16/2019 CLINICAL DATA:  Fall history of IM nail right femur EXAM: PORTABLE PELVIS 1-2 VIEWS COMPARISON:  03/25/2019, CT 04/12/2019 FINDINGS: Pubic symphysis and rami are intact. Mild arthritis left hip. Left femoral head projects in joint. Incompletely visualized intramedullary rod and fixating screws in the proximal right femur. Prior intertrochanteric fracture with callus about the fracture site. IMPRESSION: 1. Prior intramedullary rod and screw fixation of proximal right femur for intertrochanteric and subtrochanteric fracture, incompletely visualized. 2. No acute osseous abnormality of the left hip Electronically Signed   By: Donavan Foil M.D.   On: 05/16/2019 21:14   Ct Hip Right Wo Contrast  Result Date: 05/16/2019 CLINICAL DATA: Recent fracture with internal fixation.  New fall. EXAM: CT OF THE RIGHT HIP WITHOUT CONTRAST TECHNIQUE: Multidetector CT imaging of the right hip was performed according to the standard protocol. Multiplanar CT image reconstructions were also generated. COMPARISON:  Plain films April 10, 2019.  CT April 11, 2019. FINDINGS: Previously seen fracture through the right femur intertrochanteric and subtrochanteric regions are again noted. Femoral neck screws and intramedullary rod noted in place. There is extensive callus formation but fracture line remains evident. In addition, there is a new displaced fracture through the proximal femoral shaft at the level of the intramedullary nail. The distal fragment is displaced anteriorly greater than 1 shaft width with overlapping fracture fragments. No subluxation or dislocation. IMPRESSION: Healing right femoral intertrochanteric and subtrochanteric fractures with hardware in place. Extensive callus formation noted, but fracture line remains evident. New displaced and overlapping proximal femoral shaft fracture at the level of the  intramedullary nail. Distal fragment is displaced anteriorly greater than 1 shaft width. Electronically Signed   By: Rolm Baptise M.D.   On: 05/16/2019 23:06   Dg Chest Port 1 View  Result Date: 05/16/2019 CLINICAL DATA:  Preop, fall EXAM: PORTABLE CHEST 1 VIEW COMPARISON:  04/09/2019 FINDINGS: No focal airspace disease or effusion. Normal cardiomediastinal silhouette. No pneumothorax. IMPRESSION: No active disease. Electronically Signed   By: Donavan Foil M.D.   On: 05/16/2019 21:10   Dg C-arm 1-60 Min  Result Date: 05/17/2019 CLINICAL DATA:  RIGHT femoral nail. EXAM: RIGHT FEMUR 2 VIEWS; DG C-ARM 61-120 MIN COMPARISON:  Plain film of the RIGHT femur dated 05/16/2019. FINDINGS: Ten intraoperative fluoroscopic images are provided demonstrating intramedullary rod placement traversing the proximal RIGHT femur fracture site. Osseous alignment appears anatomic. Hardware appears intact and appropriately positioned, fixed with screws and cerclage wire. Dynamic hip screws also appear intact and appropriately positioned. Fluoroscopy provided for 1 minutes 41 seconds. IMPRESSION: Intraoperative fluoroscopic images demonstrating intramedullary rod placement traversing the proximal RIGHT femur fracture site. Hardware appears intact and appropriately positioned. No evidence of surgical complicating feature. Electronically Signed   By: Franki Cabot M.D.   On: 05/17/2019 11:45   Dg Femur, Min 2 Views Right  Result Date: 05/17/2019 CLINICAL DATA:  RIGHT femoral nail. EXAM: RIGHT FEMUR 2 VIEWS; DG C-ARM 61-120 MIN COMPARISON:  Plain film of the RIGHT femur dated 05/16/2019. FINDINGS: Ten intraoperative fluoroscopic images are provided demonstrating intramedullary rod placement traversing the proximal RIGHT femur fracture site.  Osseous alignment appears anatomic. Hardware appears intact and appropriately positioned, fixed with screws and cerclage wire. Dynamic hip screws also appear intact and appropriately positioned.  Fluoroscopy provided for 1 minutes 41 seconds. IMPRESSION: Intraoperative fluoroscopic images demonstrating intramedullary rod placement traversing the proximal RIGHT femur fracture site. Hardware appears intact and appropriately positioned. No evidence of surgical complicating feature. Electronically Signed   By: Franki Cabot M.D.   On: 05/17/2019 11:45   Dg Femur Port, Min 2 Views Right  Result Date: 05/17/2019 CLINICAL DATA:  Status post ORIF of right femoral fracture EXAM: RIGHT FEMUR PORTABLE 2 VIEW COMPARISON:  05/16/2019 FINDINGS: Previously seen medullary rod has been extended into the distal femur with distal fixation screws. Cerclage wire is noted surrounding the fracture proximally. The proximal fixation screws show removal of 1 of the screws. The remaining 2 screws are within normal limits. IMPRESSION: Status post ORIF of right femoral fracture. Electronically Signed   By: Inez Catalina M.D.   On: 05/17/2019 13:17   Dg Femur Portable Min 2 Views Right  Result Date: 05/16/2019 CLINICAL DATA:  Fall EXAM: RIGHT FEMUR PORTABLE 2 VIEW COMPARISON:  CT 04/12/2019, 04/11/2019, 04/10/2019, femur radiograph 03/25/2019 FINDINGS: Vascular calcifications within the soft tissues. Prior intramedullary rod a directed screws within the right femur for intertrochanteric and subtrochanteric fracture. Callus formation is noted. Acute oblique fracture proximal diaphysis of the femur, involves the distal stem of the femoral prosthesis. Mild varus angulation of distal fracture fragment and slightly greater than 1/2 bone with of lateral displacement of distal fracture fragment. IMPRESSION: 1. Acute displaced and slightly angulated periprosthetic fracture involving the proximal shaft of the femur 2. Prior intramedullary rod and screw fixation of right femur for intertrochanteric fracture Electronically Signed   By: Donavan Foil M.D.   On: 05/16/2019 21:17       Subjective: Patient seen and examined at bedside.   He is a poor historian.  Denies any worsening fever, nausea, vomiting or worsening hip pain. Discharge Exam: Vitals:   05/19/19 0803 05/19/19 1148  BP: (!) 133/99 118/80  Pulse: 100 79  Resp:    Temp: 97.8 F (36.6 C) 98 F (36.7 C)  SpO2: 97% 100%    General: Pt is alert, awake, not in acute distress Cardiovascular: rate controlled, S1/S2 + Respiratory: bilateral decreased breath sounds at bases Abdominal: Soft, NT, ND, bowel sounds + Extremities: no edema, no cyanosis    The results of significant diagnostics from this hospitalization (including imaging, microbiology, ancillary and laboratory) are listed below for reference.     Microbiology: Recent Results (from the past 240 hour(s))  SARS Coronavirus 2 (CEPHEID - Performed in [facility] hospital lab), Hosp Order     Status: None   Collection Time: 05/16/19 10:07 PM   Specimen: Nasopharyngeal Swab  Result Value Ref Range Status   SARS Coronavirus 2 NEGATIVE NEGATIVE Final    Comment: (NOTE) If result is NEGATIVE SARS-CoV-2 target nucleic acids are NOT DETECTED. The SARS-CoV-2 RNA is generally detectable in upper and lower  respiratory specimens during the acute phase of infection. The lowest  concentration of SARS-CoV-2 viral copies this assay can detect is 250  copies / mL. A negative result does not preclude SARS-CoV-2 infection  and should not be used as the sole basis for treatment or other  patient management decisions.  A negative result may occur with  improper specimen collection / handling, submission of specimen other  than nasopharyngeal swab, presence of viral mutation(s) within the  areas  targeted by this assay, and inadequate number of viral copies  (<250 copies / mL). A negative result must be combined with clinical  observations, patient history, and epidemiological information. If result is POSITIVE SARS-CoV-2 target nucleic acids are DETECTED. The SARS-CoV-2 RNA is generally detectable in upper and  lower  respiratory specimens dur ing the acute phase of infection.  Positive  results are indicative of active infection with SARS-CoV-2.  Clinical  correlation with patient history and other diagnostic information is  necessary to determine patient infection status.  Positive results do  not rule out bacterial infection or co-infection with other viruses. If result is PRESUMPTIVE POSTIVE SARS-CoV-2 nucleic acids MAY BE PRESENT.   A presumptive positive result was obtained on the submitted specimen  and confirmed on repeat testing.  While 2019 novel coronavirus  (SARS-CoV-2) nucleic acids may be present in the submitted sample  additional confirmatory testing may be necessary for epidemiological  and / or clinical management purposes  to differentiate between  SARS-CoV-2 and other Sarbecovirus currently known to infect humans.  If clinically indicated additional testing with an alternate test  methodology [phone]) is advised. The SARS-CoV-2 RNA is generally  detectable in upper and lower respiratory sp ecimens during the acute  phase of infection. The expected result is Negative. Fact Sheet for Patients:  StrictlyIdeas.no Fact Sheet for Healthcare Providers: BankingDealers.co.za This test is not yet approved or cleared by the [place] FDA and has been authorized for detection and/or diagnosis of SARS-CoV-2 by FDA under an Emergency Use Authorization (EUA).  This EUA will remain in effect (meaning this test can be used) for the duration of the COVID-19 declaration under Section 564(b)(1) of the Act, 21 U.S.C. section 360bbb-3(b)(1), unless the authorization is terminated or revoked sooner. Performed at [facility], [place] [address] [place], [place] [address]   Surgical pcr screen     Status: None   Collection Time: 05/17/19  1:34 AM   Specimen: Nasal Mucosa; Nasal Swab  Result Value Ref Range Status   MRSA, PCR NEGATIVE  NEGATIVE Final   Staphylococcus aureus NEGATIVE NEGATIVE Final    Comment: (NOTE) The Xpert SA Assay (FDA approved for NASAL specimens in patients 26 years of age and older), is one component of a comprehensive surveillance program. It is not intended to diagnose infection nor to guide or monitor treatment. Performed at [facility], [place] [address] [place], [place] [address]      Labs: BNP (last 3 results) No results for input(s): BNP in the last 8760 hours. Basic Metabolic Panel: Recent Labs  Lab 05/16/19 2002 05/17/19 0335 05/17/19 1323 05/18/19 0314 05/19/19 0506  NA 139 140  --  136 138  K 3.5 4.0  --  3.6 2.9*  CL 100 102  --  99 98  CO2 19* 21*  --  26 26  GLUCOSE 113* 123*  --  131* 100*  BUN 5* 5*  --  8 <5*  CREATININE 0.90 0.82 1.00 0.83 0.72  CALCIUM 8.9 8.8*  --  8.1* 8.1*  MG  --  1.2*  --   --  1.1*   Liver Function Tests: Recent Labs  Lab 05/18/19 0314  AST 20  ALT 9  ALKPHOS 75  BILITOT 0.7  PROT 5.9*  ALBUMIN 2.8*   No results for input(s): LIPASE, AMYLASE in the last 168 hours. No results for input(s): AMMONIA in the last 168 hours. CBC: Recent Labs  Lab 05/16/19 2002 05/17/19 0335  05/17/19 1323 05/18/19 0314 05/19/19 0506  WBC 5.5 6.1 9.1 7.5 7.0  NEUTROABS 1.9  --   --   --   --   HGB 11.5* 11.0* 10.7* 8.9* 9.1*  HCT 36.8* 32.3* 32.2* 25.8* 27.5*  MCV 90.2 84.8 86.3 84.0 86.8  PLT 356 312 252 213 182   Cardiac Enzymes: No results for input(s): CKTOTAL, CKMB, CKMBINDEX, TROPONINI in the last 168 hours. BNP: Invalid input(s): POCBNP CBG: Recent Labs  Lab 05/18/19 0805 05/18/19 1625 05/19/19 0020 05/19/19 0800 05/19/19 1308  GLUCAP 118* 134* 126* 108* 243*   D-Dimer No results for input(s): DDIMER in the last 72 hours. Hgb A1c No results for input(s): HGBA1C in the last 72 hours. Lipid Profile No results for input(s): CHOL, HDL, LDLCALC, TRIG, CHOLHDL, LDLDIRECT in the last 72 hours. Thyroid function studies No  results for input(s): TSH, T4TOTAL, T3FREE, THYROIDAB in the last 72 hours.  Invalid input(s): FREET3 Anemia work up No results for input(s): VITAMINB12, FOLATE, FERRITIN, TIBC, IRON, RETICCTPCT in the last 72 hours. Urinalysis    Component Value Date/Time   COLORURINE YELLOW 04/09/2019 2103   APPEARANCEUR CLEAR 04/09/2019 2103   LABSPEC 1.004 (L) 04/09/2019 2103   PHURINE 7.0 04/09/2019 2103   GLUCOSEU NEGATIVE 04/09/2019 2103   HGBUR NEGATIVE 04/09/2019 2103   BILIRUBINUR NEGATIVE 04/09/2019 2103   KETONESUR NEGATIVE 04/09/2019 2103   PROTEINUR NEGATIVE 04/09/2019 2103   UROBILINOGEN 1.0 07/29/2014 1837   NITRITE NEGATIVE 04/09/2019 2103   LEUKOCYTESUR NEGATIVE 04/09/2019 2103   Sepsis Labs Invalid input(s): PROCALCITONIN,  WBC,  LACTICIDVEN Microbiology Recent Results (from the past 240 hour(s))  SARS Coronavirus 2 (CEPHEID - Performed in [facility] hospital lab), Hosp Order     Status: None   Collection Time: 05/16/19 10:07 PM   Specimen: Nasopharyngeal Swab  Result Value Ref Range Status   SARS Coronavirus 2 NEGATIVE NEGATIVE Final    Comment: (NOTE) If result is NEGATIVE SARS-CoV-2 target nucleic acids are NOT DETECTED. The SARS-CoV-2 RNA is generally detectable in upper and lower  respiratory specimens during the acute phase of infection. The lowest  concentration of SARS-CoV-2 viral copies this assay can detect is 250  copies / mL. A negative result does not preclude SARS-CoV-2 infection  and should not be used as the sole basis for treatment or other  patient management decisions.  A negative result may occur with  improper specimen collection / handling, submission of specimen other  than nasopharyngeal swab, presence of viral mutation(s) within the  areas targeted by this assay, and inadequate number of viral copies  (<250 copies / mL). A negative result must be combined with clinical  observations, patient history, and epidemiological information. If result is  POSITIVE SARS-CoV-2 target nucleic acids are DETECTED. The SARS-CoV-2 RNA is generally detectable in upper and lower  respiratory specimens dur ing the acute phase of infection.  Positive  results are indicative of active infection with SARS-CoV-2.  Clinical  correlation with patient history and other diagnostic information is  necessary to determine patient infection status.  Positive results do  not rule out bacterial infection or co-infection with other viruses. If result is PRESUMPTIVE POSTIVE SARS-CoV-2 nucleic acids MAY BE PRESENT.   A presumptive positive result was obtained on the submitted specimen  and confirmed on repeat testing.  While 2019 novel coronavirus  (SARS-CoV-2) nucleic acids may be present in the submitted sample  additional confirmatory testing may be necessary for epidemiological  and / or clinical management purposes  to differentiate between  SARS-CoV-2 and other Sarbecovirus currently known to infect humans.  If clinically indicated additional testing with an alternate test  methodology [phone]) is advised. The SARS-CoV-2 RNA is generally  detectable in upper and lower respiratory sp ecimens during the acute  phase of infection. The expected result is Negative. Fact Sheet for Patients:  StrictlyIdeas.no Fact Sheet for Healthcare Providers: BankingDealers.co.za This test is not yet approved or cleared by the [place] FDA and has been authorized for detection and/or diagnosis of SARS-CoV-2 by FDA under an Emergency Use Authorization (EUA).  This EUA will remain in effect (meaning this test can be used) for the duration of the COVID-19 declaration under Section 564(b)(1) of the Act, 21 U.S.C. section 360bbb-3(b)(1), unless the authorization is terminated or revoked sooner. Performed at [facility], [place] [address] [place], [place] [address]   Surgical pcr screen     Status: None   Collection Time:  05/17/19  1:34 AM   Specimen: Nasal Mucosa; Nasal Swab  Result Value Ref Range Status   MRSA, PCR NEGATIVE NEGATIVE Final   Staphylococcus aureus NEGATIVE NEGATIVE Final    Comment: (NOTE) The Xpert SA Assay (FDA approved for NASAL specimens in patients 65 years of age and older), is one component of a comprehensive surveillance program. It is not intended to diagnose infection nor to guide or monitor treatment. Performed at [facility], [place] [address], [place], [place] [address]      Time coordinating discharge: 35 minutes  SIGNED:   Aline August, MD  [facility] 05/19/2019, 1:13 PM

## 2019-05-19 NOTE — Discharge Instructions (Signed)
Orthopaedic Trauma Service Discharge Instructions   General Discharge Instructions  WEIGHT BEARING STATUS: Nonweightbearing right leg  RANGE OF MOTION/ACTIVITY: Unrestricted range of motion right hip and right knee  Bone health: continue with vitamin d daily   Wound Care: Daily wound care starting on 05/22/2019.  Please see instructions below  Discharge Wound Care Instructions  Do NOT apply any ointments, solutions or lotions to pin sites or surgical wounds.  These prevent needed drainage and even though solutions like hydrogen peroxide kill bacteria, they also damage cells lining the pin sites that help fight infection.  Applying lotions or ointments can keep the wounds moist and can cause them to breakdown and open up as well. This can increase the risk for infection. When in doubt call the office.  Surgical incisions should be dressed daily.  If any drainage is noted, use one layer of adaptic, then gauze, Kerlix, and an ace wrap.  Once the incision is completely dry and without drainage, it may be left open to air out.  Showering may begin 36-48 hours later.  Cleaning gently with soap and water.  Traumatic wounds should be dressed daily as well.    One layer of adaptic, gauze, Kerlix, then ace wrap.  The adaptic can be discontinued once the draining has ceased    If you have a wet to dry dressing: wet the gauze with saline the squeeze as much saline out so the gauze is moist (not soaking wet), place moistened gauze over wound, then place a dry gauze over the moist one, followed by Kerlix wrap, then ace wrap.     DVT/PE prophylaxis: Lovenox 40 mg subcutaneous injection daily x 30 days   Diet: as you were eating previously.  Can use over the counter stool softeners and bowel preparations, such as Miralax, to help with bowel movements.  Narcotics can be constipating.  Be sure to drink plenty of fluids  PAIN MEDICATION USE AND EXPECTATIONS  You have likely been given narcotic  medications to help control your pain.  After a traumatic event that results in an fracture (broken bone) with or without surgery, it is ok to use narcotic pain medications to help control one's pain.  We understand that everyone responds to pain differently and each individual patient will be evaluated on a regular basis for the continued need for narcotic medications. Ideally, narcotic medication use should last no more than 6-8 weeks (coinciding with fracture healing).   As a patient it is your responsibility as well to monitor narcotic medication use and report the amount and frequency you use these medications when you come to your office visit.   We would also advise that if you are using narcotic medications, you should take a dose prior to therapy to maximize you participation.  IF YOU ARE ON NARCOTIC MEDICATIONS IT IS NOT PERMISSIBLE TO OPERATE A MOTOR VEHICLE (MOTORCYCLE/CAR/TRUCK/MOPED) OR HEAVY MACHINERY DO NOT MIX NARCOTICS WITH OTHER CNS (CENTRAL NERVOUS SYSTEM) DEPRESSANTS SUCH AS ALCOHOL   STOP SMOKING OR USING NICOTINE PRODUCTS!!!!  As discussed nicotine severely impairs your body's ability to heal surgical and traumatic wounds but also impairs bone healing.  Wounds and bone heal by forming microscopic blood vessels (angiogenesis) and nicotine is a vasoconstrictor (essentially, shrinks blood vessels).  Therefore, if vasoconstriction occurs to these microscopic blood vessels they essentially disappear and are unable to deliver necessary nutrients to the healing tissue.  This is one modifiable factor that you can do to dramatically increase your chances of  healing your injury.    (This means no smoking, no nicotine gum, patches, etc)  DO NOT USE NONSTEROIDAL ANTI-INFLAMMATORY DRUGS (NSAID'S)  Using products such as Advil (ibuprofen), Aleve (naproxen), Motrin (ibuprofen) for additional pain control during fracture healing can delay and/or prevent the healing response.  If you would like to  take over the counter (OTC) medication, Tylenol (acetaminophen) is ok.  However, some narcotic medications that are given for pain control contain acetaminophen as well. Therefore, you should not exceed more than 4000 mg of tylenol in a day if you do not have liver disease.  Also note that there are may OTC medicines, such as cold medicines and allergy medicines that my contain tylenol as well.  If you have any questions about medications and/or interactions please ask your doctor/PA or your pharmacist.      ICE AND ELEVATE INJURED/OPERATIVE EXTREMITY  Using ice and elevating the injured extremity above your heart can help with swelling and pain control.  Icing in a pulsatile fashion, such as 20 minutes on and 20 minutes off, can be followed.    Do not place ice directly on skin. Make sure there is a barrier between to skin and the ice pack.    Using frozen items such as frozen peas works well as the conform nicely to the are that needs to be iced.  USE AN ACE WRAP OR TED HOSE FOR SWELLING CONTROL  In addition to icing and elevation, Ace wraps or TED hose are used to help limit and resolve swelling.  It is recommended to use Ace wraps or TED hose until you are informed to stop.    When using Ace Wraps start the wrapping distally (farthest away from the body) and wrap proximally (closer to the body)   Example: If you had surgery on your leg or thing and you do not have a splint on, start the ace wrap at the toes and work your way up to the thigh        If you had surgery on your upper extremity and do not have a splint on, start the ace wrap at your fingers and work your way up to the upper arm  CALL THE OFFICE WITH ANY QUESTIONS OR CONCERNS: [phone]
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
## 2019-05-19 NOTE — TOC Transition Note (Signed)
Transition of Care [facility]) - CM/SW Discharge Note Marvetta Gibbons RN, BSN Transitions of Care Unit 4NP- RN Case Manager [phone]   Patient Details  Name: Richard Lopez MRN: [id number] Date of Birth: 1976/02/09  Transition of Care [facility]) CM/SW Contact:  Dawayne Patricia, RN Phone Number: 05/19/2019, 2:49 PM   Clinical Narrative:    Pt s/p fall with revision of femur peri-implant fx, per PT/OT evals no f/u needs, pt has all needed DME at home from previous admission. Lives with girlfriend. Scripts have been sent to [place]- pt will be assisted with [facility] program for medications. TOC to deliver meds to bedside prior to discharge.   Final next level of care: Home/Self Care Barriers to Discharge: No Barriers Identified   Patient Goals and CMS Choice  return home      Discharge Placement  Home                     Discharge Plan and Services                DME Arranged: N/A DME Agency: NA       HH Arranged: NA HH Agency: NA        Social Determinants of Health (SDOH) Interventions     Readmission Risk Interventions Readmission Risk Prevention Plan 05/19/2019  Transportation Screening Complete  Medication Review [employer]) Complete  PCP or Specialist appointment within 3-5 days of discharge Complete  HRI or [facility] Complete  SW Recovery Care/Counseling Consult Complete  [place] Not Applicable  Some recent data might be hidden

## 2019-08-01 ENCOUNTER — Other Ambulatory Visit: Payer: Self-pay

## 2019-08-01 ENCOUNTER — Emergency Department (HOSPITAL_COMMUNITY): Payer: Self-pay

## 2019-08-01 ENCOUNTER — Inpatient Hospital Stay (HOSPITAL_COMMUNITY): Payer: Self-pay

## 2019-08-01 ENCOUNTER — Encounter (HOSPITAL_COMMUNITY): Payer: Self-pay | Admitting: Emergency Medicine

## 2019-08-01 ENCOUNTER — Inpatient Hospital Stay (HOSPITAL_COMMUNITY)
Admission: EM | Admit: 2019-08-01 | Discharge: 2019-08-05 | DRG: 175 | Disposition: A | Discharge status: Home / Self Care | Payer: Self-pay | Attending: Internal Medicine | Admitting: Internal Medicine

## 2019-08-01 DIAGNOSIS — Z791 Long term (current) use of non-steroidal anti-inflammatories (NSAID): Secondary | ICD-10-CM

## 2019-08-01 DIAGNOSIS — R52 Pain, unspecified: Secondary | ICD-10-CM

## 2019-08-01 DIAGNOSIS — M109 Gout, unspecified: Secondary | ICD-10-CM | POA: Diagnosis present

## 2019-08-01 DIAGNOSIS — I10 Essential (primary) hypertension: Secondary | ICD-10-CM | POA: Diagnosis present

## 2019-08-01 DIAGNOSIS — Z789 Other specified health status: Secondary | ICD-10-CM

## 2019-08-01 DIAGNOSIS — I2699 Other pulmonary embolism without acute cor pulmonale: Principal | ICD-10-CM | POA: Diagnosis present

## 2019-08-01 DIAGNOSIS — F1010 Alcohol abuse, uncomplicated: Secondary | ICD-10-CM | POA: Diagnosis present

## 2019-08-01 DIAGNOSIS — K0510 Chronic gingivitis, plaque induced: Secondary | ICD-10-CM | POA: Diagnosis present

## 2019-08-01 DIAGNOSIS — J984 Other disorders of lung: Secondary | ICD-10-CM

## 2019-08-01 DIAGNOSIS — F329 Major depressive disorder, single episode, unspecified: Secondary | ICD-10-CM | POA: Diagnosis present

## 2019-08-01 DIAGNOSIS — F1210 Cannabis abuse, uncomplicated: Secondary | ICD-10-CM | POA: Diagnosis present

## 2019-08-01 DIAGNOSIS — R296 Repeated falls: Secondary | ICD-10-CM | POA: Diagnosis present

## 2019-08-01 DIAGNOSIS — E8729 Other acidosis: Secondary | ICD-10-CM | POA: Diagnosis present

## 2019-08-01 DIAGNOSIS — I82431 Acute embolism and thrombosis of right popliteal vein: Secondary | ICD-10-CM | POA: Diagnosis present

## 2019-08-01 DIAGNOSIS — Z20828 Contact with and (suspected) exposure to other viral communicable diseases: Secondary | ICD-10-CM | POA: Diagnosis present

## 2019-08-01 DIAGNOSIS — F1090 Alcohol use, unspecified, uncomplicated: Secondary | ICD-10-CM

## 2019-08-01 DIAGNOSIS — E1169 Type 2 diabetes mellitus with other specified complication: Secondary | ICD-10-CM | POA: Diagnosis present

## 2019-08-01 DIAGNOSIS — Z8249 Family history of ischemic heart disease and other diseases of the circulatory system: Secondary | ICD-10-CM

## 2019-08-01 DIAGNOSIS — Z79899 Other long term (current) drug therapy: Secondary | ICD-10-CM

## 2019-08-01 DIAGNOSIS — E785 Hyperlipidemia, unspecified: Secondary | ICD-10-CM | POA: Diagnosis present

## 2019-08-01 DIAGNOSIS — F17210 Nicotine dependence, cigarettes, uncomplicated: Secondary | ICD-10-CM | POA: Diagnosis present

## 2019-08-01 DIAGNOSIS — E872 Acidosis: Secondary | ICD-10-CM | POA: Diagnosis present

## 2019-08-01 DIAGNOSIS — E876 Hypokalemia: Secondary | ICD-10-CM | POA: Diagnosis present

## 2019-08-01 DIAGNOSIS — Z7289 Other problems related to lifestyle: Secondary | ICD-10-CM

## 2019-08-01 DIAGNOSIS — J159 Unspecified bacterial pneumonia: Secondary | ICD-10-CM | POA: Diagnosis present

## 2019-08-01 DIAGNOSIS — Z833 Family history of diabetes mellitus: Secondary | ICD-10-CM

## 2019-08-01 DIAGNOSIS — M7989 Other specified soft tissue disorders: Secondary | ICD-10-CM

## 2019-08-01 DIAGNOSIS — F419 Anxiety disorder, unspecified: Secondary | ICD-10-CM | POA: Diagnosis present

## 2019-08-01 DIAGNOSIS — Z7901 Long term (current) use of anticoagulants: Secondary | ICD-10-CM

## 2019-08-01 DIAGNOSIS — M1990 Unspecified osteoarthritis, unspecified site: Secondary | ICD-10-CM | POA: Diagnosis present

## 2019-08-01 DIAGNOSIS — M546 Pain in thoracic spine: Secondary | ICD-10-CM | POA: Diagnosis present

## 2019-08-01 DIAGNOSIS — K219 Gastro-esophageal reflux disease without esophagitis: Secondary | ICD-10-CM | POA: Diagnosis present

## 2019-08-01 DIAGNOSIS — J189 Pneumonia, unspecified organism: Secondary | ICD-10-CM

## 2019-08-01 HISTORY — DX: Other disorders of lung: J98.4

## 2019-08-01 LAB — COMPREHENSIVE METABOLIC PANEL
ALT: 9 U/L (ref 0–44)
ALT: 11 U/L (ref 0–44)
AST: 20 U/L (ref 15–41)
AST: 28 U/L (ref 15–41)
Albumin: 3.5 g/dL (ref 3.5–5.0)
Albumin: 2.8 g/dL — ABNORMAL LOW (ref 3.5–5.0)
Alkaline Phosphatase: 114 U/L (ref 38–126)
Alkaline Phosphatase: 90 U/L (ref 38–126)
Anion gap: 16 — ABNORMAL HIGH (ref 5–15)
Anion gap: 12 (ref 5–15)
BUN: 13 mg/dL (ref 6–20)
BUN: 10 mg/dL (ref 6–20)
CO2: 24 mmol/L (ref 22–32)
CO2: 23 mmol/L (ref 22–32)
Calcium: 8.4 mg/dL — ABNORMAL LOW (ref 8.9–10.3)
Calcium: 7.6 mg/dL — ABNORMAL LOW (ref 8.9–10.3)
Chloride: 88 mmol/L — ABNORMAL LOW (ref 98–111)
Chloride: 93 mmol/L — ABNORMAL LOW (ref 98–111)
Creatinine, Ser: 0.77 mg/dL (ref 0.61–1.24)
Creatinine, Ser: 0.63 mg/dL (ref 0.61–1.24)
GFR calc Af Amer: >60 mL/min (ref >60)
GFR calc Af Amer: >60 mL/min (ref >60)
GFR calc non Af Amer: >60 mL/min (ref >60)
GFR calc non Af Amer: >60 mL/min (ref >60)
Glucose, Bld: 131 mg/dL — ABNORMAL HIGH (ref 70–99)
Glucose, Bld: 114 mg/dL — ABNORMAL HIGH (ref 70–99)
Potassium: 3.8 mmol/L (ref 3.5–5.1)
Potassium: 3.5 mmol/L (ref 3.5–5.1)
Sodium: 128 mmol/L — ABNORMAL LOW (ref 135–145)
Sodium: 128 mmol/L — ABNORMAL LOW (ref 135–145)
Total Bilirubin: 2.2 mg/dL — ABNORMAL HIGH (ref 0.3–1.2)
Total Bilirubin: 1.2 mg/dL (ref 0.3–1.2)
Total Protein: 7.9 g/dL (ref 6.5–8.1)
Total Protein: 6.7 g/dL (ref 6.5–8.1)

## 2019-08-01 LAB — URINALYSIS, ROUTINE W REFLEX MICROSCOPIC
Bacteria, UA: NONE SEEN
Bilirubin Urine: NEGATIVE
Glucose, UA: NEGATIVE mg/dL
Ketones, ur: NEGATIVE mg/dL
Leukocytes,Ua: NEGATIVE
Nitrite: NEGATIVE
Protein, ur: NEGATIVE mg/dL
Specific Gravity, Urine: 1.003 — ABNORMAL LOW (ref 1.005–1.030)
pH: 6 (ref 5.0–8.0)

## 2019-08-01 LAB — CBC WITH DIFFERENTIAL/PLATELET
Abs Immature Granulocytes: 0.03 10*3/uL (ref 0.00–0.07)
Basophils Absolute: 0 10*3/uL (ref 0.0–0.1)
Basophils Relative: 0 %
Eosinophils Absolute: 0 10*3/uL (ref 0.0–0.5)
Eosinophils Relative: 0 %
HCT: 41.9 % (ref 39.0–52.0)
Hemoglobin: 13.8 g/dL (ref 13.0–17.0)
Immature Granulocytes: 0 %
Lymphocytes Relative: 20 %
Lymphs Abs: 1.9 10*3/uL (ref 0.7–4.0)
MCH: 27.5 pg (ref 26.0–34.0)
MCHC: 32.9 g/dL (ref 30.0–36.0)
MCV: 83.5 fL (ref 80.0–100.0)
Monocytes Absolute: 0.9 10*3/uL (ref 0.1–1.0)
Monocytes Relative: 9 %
Neutro Abs: 6.8 10*3/uL (ref 1.7–7.7)
Neutrophils Relative %: 71 %
Platelets: 245 10*3/uL (ref 150–400)
RBC: 5.02 MIL/uL (ref 4.22–5.81)
RDW: 18.2 % — ABNORMAL HIGH (ref 11.5–15.5)
WBC: 9.6 10*3/uL (ref 4.0–10.5)
nRBC: 0 % (ref 0.0–0.2)

## 2019-08-01 LAB — MAGNESIUM: Magnesium: 1.4 mg/dL — ABNORMAL LOW (ref 1.7–2.4)

## 2019-08-01 LAB — RAPID URINE DRUG SCREEN, HOSP PERFORMED
Amphetamines: NOT DETECTED
Barbiturates: NOT DETECTED
Benzodiazepines: NOT DETECTED
Cocaine: NOT DETECTED
Opiates: NOT DETECTED
Tetrahydrocannabinol: POSITIVE — AB

## 2019-08-01 LAB — CBC
HCT: 35.3 % — ABNORMAL LOW (ref 39.0–52.0)
Hemoglobin: 11.6 g/dL — ABNORMAL LOW (ref 13.0–17.0)
MCH: 27.6 pg (ref 26.0–34.0)
MCHC: 32.9 g/dL (ref 30.0–36.0)
MCV: 84 fL (ref 80.0–100.0)
Platelets: 227 10*3/uL (ref 150–400)
RBC: 4.2 MIL/uL — ABNORMAL LOW (ref 4.22–5.81)
RDW: 17.8 % — ABNORMAL HIGH (ref 11.5–15.5)
WBC: 8.9 10*3/uL (ref 4.0–10.5)
nRBC: 0 % (ref 0.0–0.2)

## 2019-08-01 LAB — LACTIC ACID, PLASMA: Lactic Acid, Venous: 1.8 mmol/L (ref 0.5–1.9)

## 2019-08-01 LAB — APTT: aPTT: 31 seconds (ref 24–36)

## 2019-08-01 LAB — LIPASE, BLOOD: Lipase: 19 U/L (ref 11–51)

## 2019-08-01 LAB — SARS CORONAVIRUS 2 BY RT PCR (HOSPITAL ORDER, PERFORMED IN ~~LOC~~ HOSPITAL LAB): SARS Coronavirus 2: NEGATIVE

## 2019-08-01 LAB — PHOSPHORUS: Phosphorus: 2.6 mg/dL (ref 2.5–4.6)

## 2019-08-01 LAB — HEPARIN LEVEL (UNFRACTIONATED): Heparin Unfractionated: 0.18 IU/mL — ABNORMAL LOW (ref 0.30–0.70)

## 2019-08-01 LAB — PROTIME-INR
INR: 1.2 (ref 0.8–1.2)
Prothrombin Time: 14.7 seconds (ref 11.4–15.2)

## 2019-08-01 MED ORDER — SODIUM CHLORIDE 0.9 % IV BOLUS
1000.0000 mL | Freq: Once | INTRAVENOUS | Status: AC
  Administered 2019-08-01: 08:00:00 1000 mL via INTRAVENOUS

## 2019-08-01 MED ORDER — VITAMIN B-1 100 MG PO TABS
100.0000 mg | ORAL_TABLET | Freq: Every day | ORAL | Status: DC
  Administered 2019-08-02 – 2019-08-05 (×4): 100 mg via ORAL
  Filled 2019-08-01 (×4): qty 1

## 2019-08-01 MED ORDER — HEPARIN BOLUS VIA INFUSION
6500.0000 [IU] | Freq: Once | INTRAVENOUS | Status: AC
  Administered 2019-08-01: 6500 [IU] via INTRAVENOUS
  Filled 2019-08-01: qty 6500

## 2019-08-01 MED ORDER — SODIUM CHLORIDE 0.9 % IV SOLN
3.0000 g | Freq: Four times a day (QID) | INTRAVENOUS | Status: DC
  Administered 2019-08-01 – 2019-08-05 (×15): 3 g via INTRAVENOUS
  Filled 2019-08-01: qty 3
  Filled 2019-08-01 (×2): qty 8
  Filled 2019-08-01: qty 3
  Filled 2019-08-01 (×2): qty 8
  Filled 2019-08-01 (×3): qty 3
  Filled 2019-08-01: qty 8
  Filled 2019-08-01 (×4): qty 3
  Filled 2019-08-01: qty 8
  Filled 2019-08-01 (×3): qty 3

## 2019-08-01 MED ORDER — AZITHROMYCIN 250 MG PO TABS
500.0000 mg | ORAL_TABLET | Freq: Once | ORAL | Status: AC
  Administered 2019-08-01: 500 mg via ORAL
  Filled 2019-08-01: qty 2

## 2019-08-01 MED ORDER — ACETAMINOPHEN 325 MG PO TABS
650.0000 mg | ORAL_TABLET | Freq: Once | ORAL | Status: AC
  Administered 2019-08-01: 650 mg via ORAL
  Filled 2019-08-01: qty 2

## 2019-08-01 MED ORDER — SODIUM CHLORIDE 0.9 % IV SOLN
1.0000 g | Freq: Once | INTRAVENOUS | Status: AC
  Administered 2019-08-01: 08:00:00 1 g via INTRAVENOUS
  Filled 2019-08-01: qty 10

## 2019-08-01 MED ORDER — METOCLOPRAMIDE HCL 5 MG/ML IJ SOLN
10.0000 mg | Freq: Once | INTRAMUSCULAR | Status: AC
  Administered 2019-08-01: 10 mg via INTRAVENOUS
  Filled 2019-08-01: qty 2

## 2019-08-01 MED ORDER — HEPARIN BOLUS VIA INFUSION
4000.0000 [IU] | Freq: Once | INTRAVENOUS | Status: AC
  Administered 2019-08-01: 4000 [IU] via INTRAVENOUS
  Filled 2019-08-01: qty 4000

## 2019-08-01 MED ORDER — SODIUM CHLORIDE (PF) 0.9 % IJ SOLN
INTRAMUSCULAR | Status: AC
  Filled 2019-08-01: qty 50

## 2019-08-01 MED ORDER — ADULT MULTIVITAMIN W/MINERALS CH
1.0000 | ORAL_TABLET | Freq: Every day | ORAL | Status: DC
  Administered 2019-08-02 – 2019-08-05 (×4): 1 via ORAL
  Filled 2019-08-01 (×4): qty 1

## 2019-08-01 MED ORDER — LORAZEPAM 1 MG PO TABS
1.0000 mg | ORAL_TABLET | ORAL | Status: DC | PRN
  Administered 2019-08-02 – 2019-08-03 (×5): 2 mg via ORAL
  Filled 2019-08-01: qty 2
  Filled 2019-08-01: qty 1
  Filled 2019-08-01 (×4): qty 2

## 2019-08-01 MED ORDER — SODIUM CHLORIDE 0.9 % IV SOLN: INTRAVENOUS | Status: DC

## 2019-08-01 MED ORDER — HEPARIN (PORCINE) 25000 UT/250ML-% IV SOLN
2000.0000 [IU]/h | INTRAVENOUS | Status: DC
  Administered 2019-08-01: 1700 [IU]/h via INTRAVENOUS
  Filled 2019-08-01 (×4): qty 250

## 2019-08-01 MED ORDER — LORAZEPAM 2 MG/ML IJ SOLN: 0.0000 mg | Freq: Two times a day (BID) | INTRAMUSCULAR | Status: DC

## 2019-08-01 MED ORDER — IOHEXOL 350 MG/ML SOLN
100.0000 mL | Freq: Once | INTRAVENOUS | Status: AC | PRN
  Administered 2019-08-01: 11:00:00 100 mL via INTRAVENOUS

## 2019-08-01 MED ORDER — LORAZEPAM 2 MG/ML IJ SOLN
0.0000 mg | Freq: Four times a day (QID) | INTRAMUSCULAR | Status: AC
  Administered 2019-08-02 (×2): 2 mg via INTRAVENOUS
  Filled 2019-08-01 (×3): qty 1

## 2019-08-01 MED ORDER — THIAMINE HCL 100 MG/ML IJ SOLN: 100.0000 mg | Freq: Every day | INTRAMUSCULAR | Status: DC

## 2019-08-01 MED ORDER — HYDROCODONE-ACETAMINOPHEN 5-325 MG PO TABS
1.0000 | ORAL_TABLET | ORAL | Status: DC | PRN
  Administered 2019-08-01 – 2019-08-02 (×3): 2 via ORAL
  Administered 2019-08-02: 02:00:00 1 via ORAL
  Administered 2019-08-02 – 2019-08-05 (×12): 2 via ORAL
  Filled 2019-08-01 (×5): qty 2
  Filled 2019-08-01: qty 1
  Filled 2019-08-01 (×10): qty 2

## 2019-08-01 MED ORDER — HYDROMORPHONE HCL 1 MG/ML IJ SOLN
1.0000 mg | Freq: Once | INTRAMUSCULAR | Status: AC
  Administered 2019-08-01: 1 mg via INTRAVENOUS
  Filled 2019-08-01: qty 1

## 2019-08-01 MED ORDER — FOLIC ACID 1 MG PO TABS
1.0000 mg | ORAL_TABLET | Freq: Every day | ORAL | Status: DC
  Administered 2019-08-02 – 2019-08-05 (×4): 1 mg via ORAL
  Filled 2019-08-01 (×5): qty 1

## 2019-08-01 MED ORDER — SODIUM CHLORIDE 0.9 % IV BOLUS
1000.0000 mL | Freq: Once | INTRAVENOUS | Status: AC
  Administered 2019-08-01: 1000 mL via INTRAVENOUS

## 2019-08-01 MED ORDER — ALBUTEROL SULFATE (2.5 MG/3ML) 0.083% IN NEBU: 5.0000 mg | INHALATION_SOLUTION | Freq: Once | RESPIRATORY_TRACT | Status: DC

## 2019-08-01 MED ORDER — LORAZEPAM 2 MG/ML IJ SOLN: 1.0000 mg | INTRAMUSCULAR | Status: DC | PRN

## 2019-08-01 NOTE — ED Notes (Addendum)
Critical care at bedside speaking with patient.

## 2019-08-01 NOTE — ED Notes (Signed)
Patient moved to room 19 at this time.

## 2019-08-01 NOTE — ED Notes (Signed)
Patient transported to CT 

## 2019-08-01 NOTE — ED Notes (Signed)
Report given to [place]. Care transferred at this time.

## 2019-08-01 NOTE — ED Notes (Signed)
Received verbal order from [place] for repeat EKG EKG completed at 0153 hours and given to [place] for review

## 2019-08-01 NOTE — ED Notes (Signed)
Patient given dinner tray.
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
## 2019-08-01 NOTE — ED Provider Notes (Addendum)
[facility]-EMERGENCY DEPT Provider Note   CSN: [id number] Arrival date & time: 08/01/19  0107     History   Chief Complaint Chief Complaint  Patient presents with   Shortness of Breath   Flank Pain    HPI Richard Lopez is a 43 y.o. male with history of hypertension, diabetes, and multiple orthopedic surgeries in lower extremities presenting for shortness of breath that began 2 days ago, is progressively worsening, with associated acute onset of pleuritic chest pain that is right-sided and does not radiate; severity of 8/10.  Denies any aggravating or mitigating factors.   Additionally, patient states that beginning Thursday he woke up feeling generally ill and nauseated.  He states he has some mild pain in his this time.  Patient has vomited numerous times the past 2 days including twice this morning already.  Patient states he is not able to drink water or eat food.  Patient has decreased urine output says that he is not here today and attributes this to decreased hydration.  Patient states he is not been exposed nobody with COVID that he knows of.  Has not been tested for COVID in the past.  Patient has subjective fevers, chills, malaise.  Patient denies any recent travel, states he uses crutches due to surgeries (most recent was orthopedic surgery in July), no exogenous hormone use, no hemoptysis.       HPI  Past Medical History:  Diagnosis Date   Anxiety    Arthritis    hands and possibly knee   Depression    Diabetes mellitus    diet controlled   Essential hypertension    Gastritis    Gout     Patient Active Problem List   Diagnosis Date Noted   Cavitary lesion of lung 08/01/2019   Gout 05/18/2019   Peri-prosthetic fracture of femur at tip of prosthesis 05/16/2019   Alcohol use 05/16/2019   Intertrochanteric fracture of right hip (HCC) 04/14/2019   Vitamin D deficiency 04/14/2019   DTs (delirium tremens) (HCC) 04/07/2019    Delirium tremens (HCC) 04/07/2019   Encephalopathy acute    Closed right hip fracture, initial encounter (HCC) 03/25/2019   Alcohol dependence with intoxication (HCC) 03/25/2019   Hypertensive urgency 03/25/2019   Thrombocytopenia (HCC) 03/25/2019   Effusion, right knee 03/25/2019   Closed fracture of right femur (HCC)    Metabolic acidosis    Prediabetes 04/02/2017   Gastritis 04/02/2017   Marijuana abuse 04/01/2017   Nausea with vomiting 07/29/2014   Type 2 diabetes mellitus with hyperlipidemia (HCC) 07/29/2014   Essential hypertension 07/29/2014   Nausea & vomiting 07/29/2014    Past Surgical History:  Procedure Laterality Date   EXTERNAL FIXATION LEG Left 11/07/2018   Procedure: EXTERNAL FIXATION LEFT LOWER LEG;  Surgeon: Samson Frederic, MD;  Location: [facility];  Service: Orthopedics;  Laterality: Left;   EXTERNAL FIXATION REMOVAL Left 11/08/2018   Procedure: REMOVAL EXTERNAL FIXATION LEG;  Surgeon: Roby Lofts, MD;  Location: [facility];  Service: Orthopedics;  Laterality: Left;   INTRAMEDULLARY (IM) NAIL INTERTROCHANTERIC Right 03/26/2019   Procedure: INTRAMEDULLARY (IM) NAIL INTERTROCHANTRIC;  Surgeon: Roby Lofts, MD;  Location: [facility];  Service: Orthopedics;  Laterality: Right;   INTRAMEDULLARY (IM) NAIL INTERTROCHANTERIC Right 05/17/2019   Procedure: Intramedullary (Im) Nail Intertroch with circlage wiring;  Surgeon: Myrene Galas, MD;  Location: [facility];  Service: Orthopedics;  Laterality: Right;   NO PAST SURGERIES     OPEN REDUCTION INTERNAL FIXATION (ORIF)  TIBIA/FIBULA FRACTURE Left 11/08/2018   Procedure: OPEN REDUCTION INTERNAL FIXATION (ORIF) TIBIA/FIBULA FRACTURE;  Surgeon: Shona Needles, MD;  Location: [place];  Service: Orthopedics;  Laterality: Left;   ORIF FEMUR FRACTURE Right 05/17/2019   Procedure: REMOVAL  OF HARDWARE;  Surgeon: Altamese Creola, MD;  Location: [place];  Service: Orthopedics;  Laterality: Right;        Home Medications     Prior to Admission medications   Medication Sig Start Date End Date Taking? Authorizing Provider  cholecalciferol (VITAMIN D3) 25 MCG (1000 UT) tablet Take 2 tablets (2,000 Units total) by mouth 2 (two) times daily. 05/19/19  Yes [name], PA-C  guaiFENesin (MUCINEX) 600 MG 12 hr tablet Take 600 mg by mouth 2 (two) times daily as needed for cough or to loosen phlegm.   Yes [provider]  ibuprofen (ADVIL) 200 MG tablet Take 600 mg by mouth every 6 (six) hours as needed for moderate pain.   Yes [provider]  Multiple Vitamin (MULTIVITAMIN WITH MINERALS) TABS tablet Take 1 tablet by mouth daily. 05/20/19  Yes Aline August, MD  thiamine 100 MG tablet Take 1 tablet (100 mg total) by mouth daily. 05/19/19  Yes Aline August, MD  enoxaparin (LOVENOX) 40 MG/0.4ML injection Inject 0.4 mLs (40 mg total) into the skin daily. 05/20/19 06/19/19  [name], PA-C    Family History Family History  Problem Relation Age of Onset   Diabetes Mellitus II Father    Diabetes Mellitus II Other    CAD Other     Social History Social History   Tobacco Use   Smoking status: Current Every Day Smoker    Packs/day: 0.00    Types: Cigarettes   Smokeless tobacco: Never Used   Tobacco comment: About 1 cigarette per day or less  Substance Use Topics   Alcohol use: Yes    Alcohol/week: 200.0 standard drinks    Types: 200 Cans of beer per week    Comment: 2-3 40oz beers per week   Drug use: Yes    Types: Marijuana    Comment: 3 times a week     Allergies   Patient has no known allergies.   Review of Systems Review of Systems  Constitutional: Positive for appetite change, chills, fatigue and fever.  HENT: Positive for congestion.   Eyes: Negative for visual disturbance.  Respiratory: Positive for cough, chest tightness and shortness of breath.   Cardiovascular: Positive for chest pain. Negative for palpitations and leg swelling.  Gastrointestinal: Positive for  abdominal pain. Negative for diarrhea, nausea and vomiting.  Genitourinary: Positive for flank pain. Negative for dysuria and hematuria.  Musculoskeletal: Negative for myalgias.  Skin: Negative for rash.  Neurological: Positive for headaches. Negative for dizziness and weakness.  Psychiatric/Behavioral: Negative for agitation.     Physical Exam Updated Vital Signs BP (!) 147/91    Pulse 94    Temp 98.3 F (36.8 C) (Oral)    Resp 18    Ht 6\' 6"  (1.981 m)    Wt 99.8 kg    SpO2 99%    BMI 25.42 kg/m   Physical Exam Vitals signs and nursing note reviewed.  Constitutional:      General: He is in acute distress.     Appearance: He is ill-appearing.  HENT:     Head: Normocephalic and atraumatic.     Nose: Nose normal.     Mouth/Throat:     Comments: dry Eyes:     General:  No scleral icterus. Neck:     Musculoskeletal: Normal range of motion.  Cardiovascular:     Rate and Rhythm: Regular rhythm. Tachycardia present.     Pulses: Normal pulses.     Heart sounds: Normal heart sounds.  Pulmonary:     Effort: Pulmonary effort is normal. No respiratory distress.     Breath sounds: No wheezing.     Comments: Patient is taking shallow breaths, increased respiratory rate.  No wheezing, rhonchi, rales on exam. Abdominal:     Palpations: Abdomen is soft.     Tenderness: There is no abdominal tenderness.     Comments: No CVA tenderness  Musculoskeletal:     Comments: No calf tenderness bilateral lower extremities, there is mild general swelling right knee right ankle with no edema.  Skin:    General: Skin is warm and dry.     Capillary Refill: Capillary refill takes less than 2 seconds.  Neurological:     Mental Status: He is alert. Mental status is at baseline.     Comments: Patient is at baseline, will interventions appropriately and follows commands.  Psychiatric:        Mood and Affect: Mood normal.        Behavior: Behavior normal.      ED Treatments / Results  Labs (all  labs ordered are listed, but only abnormal results are displayed) Labs Reviewed  URINALYSIS, ROUTINE W REFLEX MICROSCOPIC - Abnormal; Notable for the following components:      Result Value   Color, Urine STRAW (*)    Specific Gravity, Urine 1.003 (*)    Hgb urine dipstick MODERATE (*)    All other components within normal limits  COMPREHENSIVE METABOLIC PANEL - Abnormal; Notable for the following components:   Sodium 128 (*)    Chloride 88 (*)    Glucose, Bld 131 (*)    Calcium 8.4 (*)    Total Bilirubin 2.2 (*)    Anion gap 16 (*)    All other components within normal limits  CBC WITH DIFFERENTIAL/PLATELET - Abnormal; Notable for the following components:   RDW 18.2 (*)    All other components within normal limits  RAPID URINE DRUG SCREEN, HOSP PERFORMED - Abnormal; Notable for the following components:   Tetrahydrocannabinol POSITIVE (*)    All other components within normal limits  SARS CORONAVIRUS 2 (HOSPITAL ORDER, PERFORMED IN [facility])  CULTURE, BLOOD (ROUTINE X 2)  CULTURE, BLOOD (ROUTINE X 2)  URINE CULTURE  LIPASE, BLOOD  LACTIC ACID, PLASMA  PROTIME-INR  APTT  HEPARIN LEVEL (UNFRACTIONATED)  BLOOD GAS, ARTERIAL  RHEUMATOID FACTOR  ANTINUCLEAR ANTIBODIES, IFA  CYCLIC CITRUL PEPTIDE ANTIBODY, IGG/IGA    EKG None  Radiology Dg Chest 2 View  Result Date: 08/01/2019 CLINICAL DATA:  Shortness of breath. Right-sided chest pain. EXAM: CHEST - 2 VIEW COMPARISON:  05/16/2019 FINDINGS: Normal heart size and mediastinal contours. Mild patchy opacity in the right lung base and infrahilar lung. Left lung is clear. No pulmonary edema. No definite pleural effusion. No pneumothorax. No acute osseous abnormalities are seen. IMPRESSION: Mild patchy opacity in the right lung base, may reflect atelectasis or pneumonia. Electronically Signed   By: Narda RutherfordMelanie  Sanford M.D.   On: 08/01/2019 02:20   Ct Angio Chest Pe W And/or Wo Contrast  Result Date:  08/01/2019 CLINICAL DATA:  Shortness of breath and chest pain.  Fever. EXAM: CT ANGIOGRAPHY CHEST WITH CONTRAST TECHNIQUE: Multidetector CT imaging of the chest was performed using  the standard protocol during bolus administration of intravenous contrast. Multiplanar CT image reconstructions and MIPs were obtained to evaluate the vascular anatomy. CONTRAST:  100mL OMNIPAQUE IOHEXOL 350 MG/ML SOLN COMPARISON:  Chest radiograph August 01, 2019 FINDINGS: Cardiovascular: There are multiple pulmonary emboli involving multiple lower lobe pulmonary artery branches bilaterally. There is a slightly greater degree of pulmonary embolic change in the right lower lobe and left lower lobe. There is an incompletely obstructing pulmonary embolus in the posterior segment right upper lobe pulmonary artery branch. No central pulmonary embolus is evident. There is no right heart strain; the right ventricle to left ventricle diameter ratio is less than 0.9. There is no demonstrable thoracic aortic aneurysm or dissection. The visualized great vessels appear unremarkable. There are multiple foci of coronary artery calcification. There is no appreciable pericardial effusion or pericardial thickening. Mediastinum/Nodes: Thyroid appears normal. There is a 1.5 x 1.3 cm subcarinal lymph node. There is a right hilar lymph node measuring 1.3 x 1.1 cm. No esophageal lesions are evident. Lungs/Pleura: There is a fairly small right pleural effusion. There is airspace consolidation consistent with pneumonia involving portions of the superior and lateral segments of the right lower lobe. There is a cavitary lesion in the posterior segment of the right lower lobe measuring 3.8 x 2.6 cm. The lungs elsewhere are clear. Upper Abdomen: Visualized upper abdominal structures appear normal. Musculoskeletal: There is degenerative change in the thoracic spine. No blastic or lytic bone lesions are evident. There are no chest wall lesions. Review of the MIP  images confirms the above findings. IMPRESSION: 1.  Multiple pulmonary emboli.  No right heart strain. 2. Cavitary lesion in the posterior segment right lower lobe. Question cavitary pneumonia versus cavitary neoplasm. 3. Fairly small right pleural effusion. Airspace consolidation consistent with pneumonia involving portions of the superior and lateral segments of the right lower lobe. 4. Prominent right hilar and subcarinal lymph nodes. Neoplastic etiology for this adenopathy cannot be excluded. 5.  Multiple foci of coronary artery calcification. Critical Value/emergent results were called by telephone at the time of interpretation on 08/01/2019 at 12:03 pm to providerWYLDER Cadyn Fann , who verbally acknowledged these results. Electronically Signed   By: Bretta BangWilliam  Woodruff III M.D.   On: 08/01/2019 12:05    Procedures Procedures (including critical care time)  Medications Ordered in ED Medications  albuterol (PROVENTIL) (2.5 MG/3ML) 0.083% nebulizer solution 5 mg (5 mg Nebulization Not Given 08/01/19 0602)  sodium chloride 0.9 % bolus 1,000 mL (0 mLs Intravenous Stopped 08/01/19 1309)    And  0.9 %  sodium chloride infusion (has no administration in time range)  sodium chloride (PF) 0.9 % injection (has no administration in time range)  heparin ADULT infusion 100 units/mL (25000 units/26650mL sodium chloride 0.45%) (1,700 Units/hr Intravenous New Bag/Given 08/01/19 1402)  metoCLOPramide (REGLAN) injection 10 mg (10 mg Intravenous Given 08/01/19 0816)  acetaminophen (TYLENOL) tablet 650 mg (650 mg Oral Given 08/01/19 0816)  sodium chloride 0.9 % bolus 1,000 mL (0 mLs Intravenous Stopped 08/01/19 1101)  cefTRIAXone (ROCEPHIN) 1 g in sodium chloride 0.9 % 100 mL IVPB (0 g Intravenous Stopped 08/01/19 1101)  azithromycin (ZITHROMAX) tablet 500 mg (500 mg Oral Given 08/01/19 0821)  iohexol (OMNIPAQUE) 350 MG/ML injection 100 mL (100 mLs Intravenous Contrast Given 08/01/19 1128)  HYDROmorphone (DILAUDID) injection 1  mg (1 mg Intravenous Given 08/01/19 1241)  heparin bolus via infusion 6,500 Units (6,500 Units Intravenous Bolus from Bag 08/01/19 1402)     Initial Impression / Assessment and  Plan / ED Course  I have reviewed the triage vital signs and the nursing notes.  Pertinent labs & imaging results that were available during my care of the patient were reviewed by me and considered in my medical decision making (see chart for details).       Differential for patient includes pneumonia, PE, gastroenteritis, kidney stone, COVID-19, pyelonephritis  Chest x-ray shows possible right lower lobe pneumonia versus atelectasis will cover with antibiotics rocephin IV and azithromycin.  Daily sepsis labs ordered.  CBC with differential and CMP.  Swab for COVID.   IV fluids, antiemetic, Tylenol administered. Given dilaudid for pain.   2:24 PM Patient continues to suffer pleuritic chest pain and shortness of breath states that he has now had some hemoptysis since his initial evaluation.  New hemoptysis along with initial presentation of tachycardia and fever with pleuritic chest pain concerning for pulmonary embolism.  CTPA ordered to evaluate shows multiple pulmonary emboli cavitary lesion also noted on CT that may be cancerous in origin.   Temperature improved with Tylenol.  Patient has normal lactic acid, no white count.  Primary diagnosis of pulmonary embolism, cavitary pulmonary lesion and community-acquired pneumonia.  IV heparin ordered with pharmacy consult for dosage.  Patient requires hospitalization due to febrile with pneumonia cavitary lesion, tachycardia persistent after 2 L fluid bolus, multiple pulmonary emboli.   PA Harris admitted patient to hospitalist.   Final Clinical Impressions(s) / ED Diagnoses   Final diagnoses:  Other acute pulmonary embolism, unspecified whether acute cor pulmonale present [facility])  Pulmonary cavitary lesion  Community acquired pneumonia of right lower lobe of lung  [facility])    ED Discharge Orders    None       Gailen Shelter, [name] 08/01/19 1219    Gailen Shelter, [name] 08/01/19 1425    Vanetta Mulders, MD 08/13/19 [phone]

## 2019-08-01 NOTE — Progress Notes (Signed)
Bilateral lower extremity venous duplex has been completed. Preliminary results can be found in CV Proc through chart review.  Results were given to Noe Gens NP.  08/01/19 3:00 PM Richard Lopez RVT

## 2019-08-01 NOTE — H&P (Signed)
[facility] History and Physical   Patient: Richard Lopez ONG:[id number]   PCP: Grayce SessionsEdwards, Michelle P, NP DOB: 12/01/1975   DOA: 9/28[name]/2020   DOS: 08/01/2019   DOS: the patient was seen and examined on 08/01/2019  Patient coming from: The patient is coming from Home  Chief Complaint: Right upper back pain  HPI: Justus MemoryCarl D Dethloff is a 43 y.o. male with Past medical history of alcohol abuse, recurrent fall with multiple fractures, diet-controlled diabetes, HTN.,  Marijuana abuse. Patient presents with complaints of right upper back pain.  He mentions that this is been ongoing since last 2 days.  He has some cough for last 3 days.  Has started having fever since yesterday as well.  No nausea no vomiting. No long-term history of productive cough, weight loss, fatigue, chills, night sweats or hemoptysis. Denies any drug abuse currently but has been using alcohol regularly. No diarrhea no constipation. Reports no smoking. He reports that after his recent hospitalization he has been ambulating with the crutches and does not spend most of his time in the bed.  ED Course: Patient was initially evaluated with a chest x-ray.  Due to persistent tachycardia CT PE protocol was performed which showed bilateral PE as well as a large cavitary mass on the right lower lobe.  At his baseline ambulates with crutches Is independent for most of his ADL;  Does manages his medication on his own.  Review of Systems: as mentioned in the history of present illness.  All other systems reviewed and are negative.  Past Medical History:  Diagnosis Date   Anxiety    Arthritis    hands and possibly knee   Depression    Diabetes mellitus    diet controlled   Essential hypertension    Gastritis    Gout    Past Surgical History:  Procedure Laterality Date   EXTERNAL FIXATION LEG Left 11/07/2018   Procedure: EXTERNAL FIXATION LEFT LOWER LEG;  Surgeon: Samson FredericSwinteck, Brian, MD;  Location: [facility];  Service:  Orthopedics;  Laterality: Left;   EXTERNAL FIXATION REMOVAL Left 11/08/2018   Procedure: REMOVAL EXTERNAL FIXATION LEG;  Surgeon: Roby LoftsHaddix, Kevin P, MD;  Location: [facility];  Service: Orthopedics;  Laterality: Left;   INTRAMEDULLARY (IM) NAIL INTERTROCHANTERIC Right 03/26/2019   Procedure: INTRAMEDULLARY (IM) NAIL INTERTROCHANTRIC;  Surgeon: Roby LoftsHaddix, Kevin P, MD;  Location: [facility];  Service: Orthopedics;  Laterality: Right;   INTRAMEDULLARY (IM) NAIL INTERTROCHANTERIC Right 05/17/2019   Procedure: Intramedullary (Im) Nail Intertroch with circlage wiring;  Surgeon: Myrene GalasHandy, Michael, MD;  Location: [facility];  Service: Orthopedics;  Laterality: Right;   NO PAST SURGERIES     OPEN REDUCTION INTERNAL FIXATION (ORIF) TIBIA/FIBULA FRACTURE Left 11/08/2018   Procedure: OPEN REDUCTION INTERNAL FIXATION (ORIF) TIBIA/FIBULA FRACTURE;  Surgeon: Roby LoftsHaddix, Kevin P, MD;  Location: [facility];  Service: Orthopedics;  Laterality: Left;   ORIF FEMUR FRACTURE Right 05/17/2019   Procedure: REMOVAL  OF HARDWARE;  Surgeon: Myrene GalasHandy, Michael, MD;  Location: [facility];  Service: Orthopedics;  Laterality: Right;   Social History:  reports that he has been smoking cigarettes. He has been smoking about 0.00 packs per day. He has never used smokeless tobacco. He reports current alcohol use of about 200.0 standard drinks of alcohol per week. He reports current drug use. Drug: Marijuana.  No Known Allergies  Family history reviewed and not pertinent Family History  Problem Relation Age of Onset   Diabetes Mellitus II Father    Diabetes Mellitus II Other  CAD Other      Prior to Admission medications   Medication Sig Start Date End Date Taking? Authorizing Provider  cholecalciferol (VITAMIN D3) 25 MCG (1000 UT) tablet Take 2 tablets (2,000 Units total) by mouth 2 (two) times daily. 05/19/19  Yes Montez Morita, PA-C  guaiFENesin (MUCINEX) 600 MG 12 hr tablet Take 600 mg by mouth 2 (two) times daily as needed for cough or to loosen phlegm.    Yes [provider]  ibuprofen (ADVIL) 200 MG tablet Take 600 mg by mouth every 6 (six) hours as needed for moderate pain.   Yes [provider]  Multiple Vitamin (MULTIVITAMIN WITH MINERALS) TABS tablet Take 1 tablet by mouth daily. 05/20/19  Yes Glade Lloyd, MD  thiamine 100 MG tablet Take 1 tablet (100 mg total) by mouth daily. 05/19/19  Yes Glade Lloyd, MD  enoxaparin (LOVENOX) 40 MG/0.4ML injection Inject 0.4 mLs (40 mg total) into the skin daily. 05/20/19 06/19/19  Montez Morita, PA-C    Physical Exam: Vitals:   08/01/19 1500 08/01/19 1600 08/01/19 1604 08/01/19 1700  BP: (!) 153/101 (!) 144/99 (!) 144/99 133/89  Pulse: (!) 103 93 91 85  Resp:   18 18  Temp:      TempSrc:      SpO2: 100% 100% 100% 100%  Weight:      Height:        General: alert and oriented to time, place, and person. Appear in mild distress, affect appropriate Eyes: PERRL, Conjunctiva normal ENT: Oral Mucosa Clear, moist  Neck: no JVD, no Abnormal Mass Or lumps Cardiovascular: S1 and S2 Present, no Murmur, peripheral pulses symmetrical Respiratory: good respiratory effort, Bilateral Air entry equal and Decreased, no signs of accessory muscle use, right Crackles, no wheezes Abdomen: Bowel Sound present, Soft and no tenderness, no hernia Skin: no rashes  Extremities: no Pedal edema, no calf tenderness Neurologic: without any new focal findings Gait not checked due to patient safety concerns  Data Reviewed: I have personally reviewed and interpreted labs, imaging as discussed below.  CBC: Recent Labs  Lab 08/01/19 0818  WBC 9.6  NEUTROABS 6.8  HGB 13.8  HCT 41.9  MCV 83.5  PLT 245   Basic Metabolic Panel: Recent Labs  Lab 08/01/19 0818  NA 128*  K 3.8  CL 88*  CO2 24  GLUCOSE 131*  BUN 13  CREATININE 0.77  CALCIUM 8.4*   GFR: Estimated Creatinine Clearance: 155.5 mL/min (by C-G formula based on SCr of 0.77 mg/dL). Liver Function Tests: Recent Labs  Lab  08/01/19 0818  AST 20  ALT 9  ALKPHOS 114  BILITOT 2.2*  PROT 7.9  ALBUMIN 3.5   Recent Labs  Lab 08/01/19 0818  LIPASE 19   No results for input(s): AMMONIA in the last 168 hours. Coagulation Profile: Recent Labs  Lab 08/01/19 1100  INR 1.2   Cardiac Enzymes: No results for input(s): CKTOTAL, CKMB, CKMBINDEX, TROPONINI in the last 168 hours. BNP (last 3 results) No results for input(s): PROBNP in the last 8760 hours. HbA1C: No results for input(s): HGBA1C in the last 72 hours. CBG: No results for input(s): GLUCAP in the last 168 hours. Lipid Profile: No results for input(s): CHOL, HDL, LDLCALC, TRIG, CHOLHDL, LDLDIRECT in the last 72 hours. Thyroid Function Tests: No results for input(s): TSH, T4TOTAL, FREET4, T3FREE, THYROIDAB in the last 72 hours. Anemia Panel: No results for input(s): VITAMINB12, FOLATE, FERRITIN, TIBC, IRON, RETICCTPCT in the last 72 hours. Urine analysis:  Component Value Date/Time   COLORURINE STRAW (A) 08/01/2019 0818   APPEARANCEUR CLEAR 08/01/2019 0818   LABSPEC 1.003 (L) 08/01/2019 0818   PHURINE 6.0 08/01/2019 0818   GLUCOSEU NEGATIVE 08/01/2019 0818   HGBUR MODERATE (A) 08/01/2019 0818   BILIRUBINUR NEGATIVE 08/01/2019 0818   KETONESUR NEGATIVE 08/01/2019 0818   PROTEINUR NEGATIVE 08/01/2019 0818   UROBILINOGEN 1.0 07/29/2014 1837   NITRITE NEGATIVE 08/01/2019 0818   LEUKOCYTESUR NEGATIVE 08/01/2019 0818    Radiological Exams on Admission: Dg Chest 2 View  Result Date: 08/01/2019 CLINICAL DATA:  Shortness of breath. Right-sided chest pain. EXAM: CHEST - 2 VIEW COMPARISON:  05/16/2019 FINDINGS: Normal heart size and mediastinal contours. Mild patchy opacity in the right lung base and infrahilar lung. Left lung is clear. No pulmonary edema. No definite pleural effusion. No pneumothorax. No acute osseous abnormalities are seen. IMPRESSION: Mild patchy opacity in the right lung base, may reflect atelectasis or pneumonia.  Electronically Signed   By: Narda RutherfordMelanie  Sanford M.D.   On: 08/01/2019 02:20   Ct Angio Chest Pe W And/or Wo Contrast  Result Date: 08/01/2019 CLINICAL DATA:  Shortness of breath and chest pain.  Fever. EXAM: CT ANGIOGRAPHY CHEST WITH CONTRAST TECHNIQUE: Multidetector CT imaging of the chest was performed using the standard protocol during bolus administration of intravenous contrast. Multiplanar CT image reconstructions and MIPs were obtained to evaluate the vascular anatomy. CONTRAST:  100mL OMNIPAQUE IOHEXOL 350 MG/ML SOLN COMPARISON:  Chest radiograph August 01, 2019 FINDINGS: Cardiovascular: There are multiple pulmonary emboli involving multiple lower lobe pulmonary artery branches bilaterally. There is a slightly greater degree of pulmonary embolic change in the right lower lobe and left lower lobe. There is an incompletely obstructing pulmonary embolus in the posterior segment right upper lobe pulmonary artery branch. No central pulmonary embolus is evident. There is no right heart strain; the right ventricle to left ventricle diameter ratio is less than 0.9. There is no demonstrable thoracic aortic aneurysm or dissection. The visualized great vessels appear unremarkable. There are multiple foci of coronary artery calcification. There is no appreciable pericardial effusion or pericardial thickening. Mediastinum/Nodes: Thyroid appears normal. There is a 1.5 x 1.3 cm subcarinal lymph node. There is a right hilar lymph node measuring 1.3 x 1.1 cm. No esophageal lesions are evident. Lungs/Pleura: There is a fairly small right pleural effusion. There is airspace consolidation consistent with pneumonia involving portions of the superior and lateral segments of the right lower lobe. There is a cavitary lesion in the posterior segment of the right lower lobe measuring 3.8 x 2.6 cm. The lungs elsewhere are clear. Upper Abdomen: Visualized upper abdominal structures appear normal. Musculoskeletal: There is  degenerative change in the thoracic spine. No blastic or lytic bone lesions are evident. There are no chest wall lesions. Review of the MIP images confirms the above findings. IMPRESSION: 1.  Multiple pulmonary emboli.  No right heart strain. 2. Cavitary lesion in the posterior segment right lower lobe. Question cavitary pneumonia versus cavitary neoplasm. 3. Fairly small right pleural effusion. Airspace consolidation consistent with pneumonia involving portions of the superior and lateral segments of the right lower lobe. 4. Prominent right hilar and subcarinal lymph nodes. Neoplastic etiology for this adenopathy cannot be excluded. 5.  Multiple foci of coronary artery calcification. Critical Value/emergent results were called by telephone at the time of interpretation on 08/01/2019 at 12:03 pm to providerWYLDER FONDAW , who verbally acknowledged these results. Electronically Signed   By: Bretta BangWilliam  Woodruff III M.D.   On:  08/01/2019 12:05   Vas [place] Lower Extremity Venous (dvt)  Result Date: 08/01/2019  Lower Venous Study Indications: Pain, and Swelling.  Risk Factors: None identified. Anticoagulation: Heparin. Comparison Study: No prior studies. Performing Technologist: Oliver Hum RVT  Examination Guidelines: A complete evaluation includes B-mode imaging, spectral Doppler, color Doppler, and power Doppler as needed of all accessible portions of each vessel. Bilateral testing is considered an integral part of a complete examination. Limited examinations for reoccurring indications may be performed as noted.  +---------+---------------+---------+-----------+----------+--------------+  RIGHT     Compressibility Phasicity Spontaneity Properties Thrombus Aging  +---------+---------------+---------+-----------+----------+--------------+  CFV       Full            Yes       Yes                                    +---------+---------------+---------+-----------+----------+--------------+  SFJ       Full                                                              +---------+---------------+---------+-----------+----------+--------------+  FV Prox   Full                                                             +---------+---------------+---------+-----------+----------+--------------+  FV Mid    Full                                                             +---------+---------------+---------+-----------+----------+--------------+  FV Distal Full                                                             +---------+---------------+---------+-----------+----------+--------------+  PFV       Full                                                             +---------+---------------+---------+-----------+----------+--------------+  POP       Partial         No        No                     Acute           +---------+---------------+---------+-----------+----------+--------------+  PTV       None  Acute           +---------+---------------+---------+-----------+----------+--------------+  PERO      Full                                                             +---------+---------------+---------+-----------+----------+--------------+  Gastroc   Partial                                          Acute           +---------+---------------+---------+-----------+----------+--------------+   +---------+---------------+---------+-----------+----------+--------------+  LEFT      Compressibility Phasicity Spontaneity Properties Thrombus Aging  +---------+---------------+---------+-----------+----------+--------------+  CFV       Full            Yes       Yes                                    +---------+---------------+---------+-----------+----------+--------------+  SFJ       Full                                                             +---------+---------------+---------+-----------+----------+--------------+  FV Prox   Full                                                              +---------+---------------+---------+-----------+----------+--------------+  FV Mid    Full                                                             +---------+---------------+---------+-----------+----------+--------------+  FV Distal Full                                                             +---------+---------------+---------+-----------+----------+--------------+  PFV       Full                                                             +---------+---------------+---------+-----------+----------+--------------+  POP       Full            Yes       Yes                                    +---------+---------------+---------+-----------+----------+--------------+  PTV       Full                                                             +---------+---------------+---------+-----------+----------+--------------+  PERO      Full                                                             +---------+---------------+---------+-----------+----------+--------------+     Summary: Right: Findings consistent with acute deep vein thrombosis involving the right popliteal vein, right posterior tibial veins, and right gastrocnemius veins. No cystic structure found in the popliteal fossa. Left: There is no evidence of deep vein thrombosis in the lower extremity. No cystic structure found in the popliteal fossa.  *See table(s) above for measurements and observations. Electronically signed by Fabienne Bruns MD on 08/01/2019 at 4:08:12 PM.    Final    EKG: Independently reviewed. normal sinus rhythm, nonspecific ST and T waves changes.  I reviewed all nursing notes, pharmacy notes, vitals, pertinent old records.  Assessment/Plan 1. Cavitary lesion of lung Presents with complaints of right upper back pain. CT scan shows cavitary lesion of the lung. Discussed with pulmonary as well as ID. Given no long-term history of cough weight loss night sweats or hemoptysis ID does not think that the patient has  tuberculosis. CT abdomen and pelvis performed in June 2020 does not show presence of this lesion which means this lesion is relatively subacute in nature which also rules out major concern for malignancy. Pulmonary recommends IV antibiotics and conservative measures. Outpatient follow-up recommended by pulmonary. Currently signed off.  We will follow-up on cultures.  2.  Acute pulmonary embolism CT scan shows bilateral multifocal pulmonary embolism. Currently patient is not hypoxic but remains tachycardic. Patient was started on IV heparin which I will continue for now. We need to discuss oral anticoagulation option for the patient given his history of alcohol abuse and recurrent fall.  3.  Recurrent falls. Multiple fractures. Monitor for now.  4.  Alcohol abuse. Marijuana abuse. Metabolic acidosis. We will provide IV hydration. Currently on CIWA protocol. Monitor for withdrawal.  5.  Diet controlled diabetes. Type II controlled well. Sliding scale insulin.  Nutrition: Carb modified diet DVT Prophylaxis: Therapeutic Anticoagulation with Heparin  Advance goals of care discussion: Full code   Consults: I personally Discussed with CCM and infectious disease  Family Communication: no family was present at bedside, at the time of interview.  Disposition: Admitted as inpatient, telemetry unit. Likely to be discharged home, in 2 days.  I have discussed plan of care as described above with RN and patient/family.  Author: Lynden Oxford, MD [facility] 08/01/2019 6:42 PM   To reach On-call, see care teams to locate the attending and reach out to them via [email or web address]. If 7PM-7AM, please contact night-coverage If you still have difficulty reaching the attending provider, please page the [facility] (Director on Call) for [facility] on amion for assistance.

## 2019-08-01 NOTE — ED Triage Notes (Signed)
Patient is complaining of sob, right flank pain, and having a cold. Patient states when he coughs his right side starts to hurt. Patient states he feels like he has a cold.

## 2019-08-01 NOTE — Progress Notes (Addendum)
Richard Lopez for IV heparin Indication: new PE  No Known Allergies  Patient Measurements: Height: 6\' 6"  (198.1 cm) Weight: 220 lb (99.8 kg) IBW/kg (Calculated) : 91.4 Heparin Dosing Weight: TBW  Vital Signs: BP: 149/96 (09/28 2100) Pulse Rate: 98 (09/28 2100)  Labs: Recent Labs    08/01/19 0818 08/01/19 1100 08/01/19 2016  HGB 13.8  --  11.6*  HCT 41.9  --  35.3*  PLT 245  --  227  APTT  --  31  --   LABPROT  --  14.7  --   INR  --  1.2  --   HEPARINUNFRC  --   --  0.18*  CREATININE 0.77  --  0.63    Estimated Creatinine Clearance: 155.5 mL/min (by C-G formula based on SCr of 0.63 mg/dL).   Medical History: Past Medical History:  Diagnosis Date  . Anxiety   . 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 Depression   . Diabetes mellitus    diet controlled  . Essential hypertension   . Gastritis   . Gout     Medications:  (Not in a hospital admission)  Scheduled:  . albuterol  5 mg Nebulization Once  . folic acid  1 mg Oral Daily  . heparin  4,000 Units Intravenous Once  . LORazepam  0-4 mg Intravenous Q6H   Followed by  . [START ON 08/03/2019] LORazepam  0-4 mg Intravenous Q12H  . multivitamin with minerals  1 tablet Oral Daily  . sodium chloride (PF)      . thiamine  100 mg Oral Daily   Or  . thiamine  100 mg Intravenous Daily   PRN:   Assessment: 7 yoM with PMH HTN, DM2, multiple recent orthopedic surgeries on LE, presents with progressively worsening SOB, tachycardia, pleuritic CP, and also flank pain with n/v/f/c and decreased UOP. CTA shows multiple PEs along with cavitary lesion that may be malignant. Pharmacy consulted to dose IV heparin   Baseline INR, aPTT: WNL  Prior anticoagulation: none  Significant events:  Today, 08/01/2019:  CBC: WNL  CrCl: WNL  Very slight hemoptysis noted, but otherwise no bleeding present per RN  Goal of Therapy: Heparin level 0.3-0.7 units/ml Monitor platelets by  anticoagulation protocol: Yes  Plan:  Heparin 6500 units IV bolus x 1 (~70 units/kg)  Heparin 1700 units/hr IV infusion (~18 units/kg/hr)  Check heparin level 6 hrs after start  Daily CBC, daily heparin level once stable  Monitor for signs of bleeding or thrombosis   Reuel Boom, PharmD, BCPS [phone] 08/01/2019, 9:33 PM   ADDENDUM  First heparin level SUBtherapeutic on initial rate of 1700 units/hr; RN reports no line/infusion issues, and no changes in hemoptysis - still minimally pink-tinged sputum  Increase heparin to 2000 units/hr; rebolus with 4000 units x1  Recheck heparin level in 6 hrs  Reuel Boom, PharmD, BCPS [phone] 08/01/2019, 9:35 PM

## 2019-08-01 NOTE — Progress Notes (Signed)
PHARMACY NOTE -  Unasyn  Pharmacy has been assisting with dosing of Unasyn for aspiration PNA.  Dosage remains stable at 3g IV q6 hr and need for further dosage adjustment appears unlikely at present given renal function stable at baseline  Pharmacy will sign off, following peripherally for culture results or dose adjustments. Please reconsult if a change in clinical status warrants re-evaluation of dosage.  Reuel Boom, PharmD, BCPS [phone] 08/01/2019, 2:48 PM

## 2019-08-01 NOTE — ED Notes (Signed)
Admission provider at bedside

## 2019-08-02 LAB — URINE CULTURE: Culture: NO GROWTH

## 2019-08-02 LAB — CBC
HCT: 35.7 % — ABNORMAL LOW (ref 39.0–52.0)
Hemoglobin: 11.7 g/dL — ABNORMAL LOW (ref 13.0–17.0)
MCH: 28.1 pg (ref 26.0–34.0)
MCHC: 32.8 g/dL (ref 30.0–36.0)
MCV: 85.8 fL (ref 80.0–100.0)
Platelets: 201 10*3/uL (ref 150–400)
RBC: 4.16 MIL/uL — ABNORMAL LOW (ref 4.22–5.81)
RDW: 17.9 % — ABNORMAL HIGH (ref 11.5–15.5)
WBC: 6.7 10*3/uL (ref 4.0–10.5)
nRBC: 0 % (ref 0.0–0.2)

## 2019-08-02 LAB — COMPREHENSIVE METABOLIC PANEL
ALT: 7 U/L (ref 0–44)
AST: 14 U/L — ABNORMAL LOW (ref 15–41)
Albumin: 2.5 g/dL — ABNORMAL LOW (ref 3.5–5.0)
Alkaline Phosphatase: 76 U/L (ref 38–126)
Anion gap: 12 (ref 5–15)
BUN: 7 mg/dL (ref 6–20)
CO2: 22 mmol/L (ref 22–32)
Calcium: 7.3 mg/dL — ABNORMAL LOW (ref 8.9–10.3)
Chloride: 97 mmol/L — ABNORMAL LOW (ref 98–111)
Creatinine, Ser: 0.56 mg/dL — ABNORMAL LOW (ref 0.61–1.24)
GFR calc Af Amer: >60 mL/min (ref >60)
GFR calc non Af Amer: >60 mL/min (ref >60)
Glucose, Bld: 88 mg/dL (ref 70–99)
Potassium: 2.9 mmol/L — ABNORMAL LOW (ref 3.5–5.1)
Sodium: 131 mmol/L — ABNORMAL LOW (ref 135–145)
Total Bilirubin: 1.1 mg/dL (ref 0.3–1.2)
Total Protein: 5.7 g/dL — ABNORMAL LOW (ref 6.5–8.1)

## 2019-08-02 LAB — HEPARIN LEVEL (UNFRACTIONATED): Heparin Unfractionated: 0.39 IU/mL (ref 0.30–0.70)

## 2019-08-02 MED ORDER — SODIUM CHLORIDE 0.9 % IV SOLN: INTRAVENOUS | Status: DC

## 2019-08-02 MED ORDER — ONDANSETRON HCL 4 MG/2ML IJ SOLN: 4.0000 mg | Freq: Four times a day (QID) | INTRAMUSCULAR | Status: DC | PRN

## 2019-08-02 MED ORDER — ALBUTEROL SULFATE (2.5 MG/3ML) 0.083% IN NEBU: 2.5000 mg | INHALATION_SOLUTION | RESPIRATORY_TRACT | Status: DC | PRN

## 2019-08-02 MED ORDER — ACETAMINOPHEN 325 MG PO TABS: 650.0000 mg | ORAL_TABLET | Freq: Four times a day (QID) | ORAL | Status: DC | PRN

## 2019-08-02 MED ORDER — VITAMIN B-1 100 MG PO TABS: 100.0000 mg | ORAL_TABLET | Freq: Every day | ORAL | Status: DC

## 2019-08-02 MED ORDER — ONDANSETRON HCL 4 MG PO TABS: 4.0000 mg | ORAL_TABLET | Freq: Four times a day (QID) | ORAL | Status: DC | PRN

## 2019-08-02 MED ORDER — DOCUSATE SODIUM 100 MG PO CAPS
100.0000 mg | ORAL_CAPSULE | Freq: Two times a day (BID) | ORAL | Status: DC
  Administered 2019-08-02 – 2019-08-05 (×7): 100 mg via ORAL
  Filled 2019-08-02 (×7): qty 1

## 2019-08-02 MED ORDER — MAGNESIUM SULFATE 2 GM/50ML IV SOLN
2.0000 g | Freq: Once | INTRAVENOUS | Status: AC
  Administered 2019-08-02: 14:00:00 2 g via INTRAVENOUS
  Filled 2019-08-02: qty 50

## 2019-08-02 MED ORDER — ACETAMINOPHEN 650 MG RE SUPP: 650.0000 mg | Freq: Four times a day (QID) | RECTAL | Status: DC | PRN

## 2019-08-02 MED ORDER — ENOXAPARIN SODIUM 100 MG/ML ~~LOC~~ SOLN
100.0000 mg | Freq: Two times a day (BID) | SUBCUTANEOUS | Status: DC
  Administered 2019-08-02 – 2019-08-03 (×3): 100 mg via SUBCUTANEOUS
  Filled 2019-08-02 (×3): qty 1

## 2019-08-02 MED ORDER — GUAIFENESIN ER 600 MG PO TB12
600.0000 mg | ORAL_TABLET | Freq: Two times a day (BID) | ORAL | Status: DC
  Administered 2019-08-02 – 2019-08-05 (×6): 600 mg via ORAL
  Filled 2019-08-02 (×7): qty 1

## 2019-08-02 MED ORDER — POTASSIUM CHLORIDE CRYS ER 20 MEQ PO TBCR
40.0000 meq | EXTENDED_RELEASE_TABLET | Freq: Two times a day (BID) | ORAL | Status: AC
  Administered 2019-08-02 – 2019-08-03 (×4): 40 meq via ORAL
  Filled 2019-08-02 (×4): qty 2

## 2019-08-02 NOTE — ED Notes (Signed)
ED TO INPATIENT HANDOFF REPORT  ED Nurse Name and Phone #:   S Name/Age/Gender Richard Lopez 43 y.o. male Room/Bed: WA19/WA19  Code Status   Code Status: Full Code  Home/SNF/Other Home Patient oriented to: self, place, time and situation Is this baseline? Yes   Triage Complete: Triage complete  Chief Complaint Shortness of Breath; Flank Pain   Triage Note Patient is complaining of sob, right flank pain, and having a cold. Patient states when he coughs his right side starts to hurt. Patient states he feels like he has a cold.   Allergies No Known Allergies  Level of Care/Admitting Diagnosis ED Disposition    ED Disposition Condition Comment   Admit  Hospital Area: [facility] [[id number]]  Level of Care: Telemetry [5]  Admit to tele based on following criteria: Complex arrhythmia (Bradycardia/Tachycardia)  Covid Evaluation: Confirmed COVID Negative  Diagnosis: Cavitary lesion of lung [[id number]]  Admitting Physician: Rolly Salter [[id number]]  Attending Physician: Rolly Salter [[id number]]  Estimated length of stay: past midnight tomorrow  Certification:: I certify this patient will need inpatient services for at least 2 midnights  PT Class (Do Not Modify): Inpatient [101]  PT Acc Code (Do Not Modify): Private [1]       B Medical/Surgery History Past Medical History:  Diagnosis Date  . Anxiety   . 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 Depression   . Diabetes mellitus    diet controlled  . Essential hypertension   . Gastritis   . Gout    Past Surgical History:  Procedure Laterality Date  . EXTERNAL FIXATION LEG Left 11/07/2018   Procedure: EXTERNAL FIXATION LEFT LOWER LEG;  Surgeon: Samson Frederic, MD;  Location: [facility];  Service: Orthopedics;  Laterality: Left;  . EXTERNAL FIXATION REMOVAL Left 11/08/2018   Procedure: REMOVAL EXTERNAL FIXATION LEG;  Surgeon: Roby Lofts, MD;  Location: [facility] OR;  Service: Orthopedics;  Laterality: Left;  .  INTRAMEDULLARY (IM) NAIL INTERTROCHANTERIC Right 03/26/2019   Procedure: INTRAMEDULLARY (IM) NAIL INTERTROCHANTRIC;  Surgeon: Roby Lofts, MD;  Location: [facility] OR;  Service: Orthopedics;  Laterality: Right;  . INTRAMEDULLARY (IM) NAIL INTERTROCHANTERIC Right 05/17/2019   Procedure: Intramedullary (Im) Nail Intertroch with circlage wiring;  Surgeon: Myrene Galas, MD;  Location: [facility];  Service: Orthopedics;  Laterality: Right;  . NO PAST SURGERIES    . OPEN REDUCTION INTERNAL FIXATION (ORIF) TIBIA/FIBULA FRACTURE Left 11/08/2018   Procedure: OPEN REDUCTION INTERNAL FIXATION (ORIF) TIBIA/FIBULA FRACTURE;  Surgeon: Roby Lofts, MD;  Location: [facility];  Service: Orthopedics;  Laterality: Left;  . ORIF FEMUR FRACTURE Right 05/17/2019   Procedure: REMOVAL  OF HARDWARE;  Surgeon: Myrene Galas, MD;  Location: [facility];  Service: Orthopedics;  Laterality: Right;     A IV Location/Drains/Wounds Patient Lines/Drains/Airways Status   Active Line/Drains/Airways    Name:   Placement date:   Placement time:   Site:   Days:   Peripheral IV 08/01/19 Right Antecubital   08/01/19    0815    Antecubital   1   Peripheral IV 08/01/19 Left Hand   08/01/19    1325    Hand   1          Intake/Output Last 24 hours  Intake/Output Summary (Last 24 hours) at 08/02/2019 0735 Last data filed at 08/01/2019 1101 Gross per 24 hour  Intake 100 ml  Output -  Net 100 ml    Labs/Imaging Results for orders placed  or performed during the hospital encounter of 08/01/19 (from the past 48 hour(s))  Urinalysis, Routine w reflex microscopic- may I&O cath if menses     Status: Abnormal   Collection Time: 08/01/19  8:18 AM  Result Value Ref Range   Color, Urine STRAW (A) YELLOW   APPearance CLEAR CLEAR   Specific Gravity, Urine 1.003 (L) 1.005 - 1.030   pH 6.0 5.0 - 8.0   Glucose, UA NEGATIVE NEGATIVE mg/dL   Hgb urine dipstick MODERATE (A) NEGATIVE   Bilirubin Urine NEGATIVE NEGATIVE   Ketones, ur NEGATIVE NEGATIVE  mg/dL   Protein, ur NEGATIVE NEGATIVE mg/dL   Nitrite NEGATIVE NEGATIVE   Leukocytes,Ua NEGATIVE NEGATIVE   RBC / HPF 0-5 0 - 5 RBC/hpf   Bacteria, UA NONE SEEN NONE SEEN    Comment: Performed at [facility], [address] [place], [place] [address]  Comprehensive metabolic panel     Status: Abnormal   Collection Time: 08/01/19  8:18 AM  Result Value Ref Range   Sodium 128 (L) 135 - 145 mmol/L   Potassium 3.8 3.5 - 5.1 mmol/L   Chloride 88 (L) 98 - 111 mmol/L   CO2 24 22 - 32 mmol/L   Glucose, Bld 131 (H) 70 - 99 mg/dL   BUN 13 6 - 20 mg/dL   Creatinine, Ser 6.040.77 0.61 - 1.24 mg/dL   Calcium 8.4 (L) 8.9 - 10.3 mg/dL   Total Protein 7.9 6.5 - 8.1 g/dL   Albumin 3.5 3.5 - 5.0 g/dL   AST 20 15 - 41 U/L   ALT 9 0 - 44 U/L   Alkaline Phosphatase 114 38 - 126 U/L   Total Bilirubin 2.2 (H) 0.3 - 1.2 mg/dL   GFR calc non Af Amer >60 >60 mL/min   GFR calc Af Amer >60 >60 mL/min   Anion gap 16 (H) 5 - 15    Comment: Performed at [facility], [address] [place], [place] [address]  Lipase, blood     Status: None   Collection Time: 08/01/19  8:18 AM  Result Value Ref Range   Lipase 19 11 - 51 U/L    Comment: Performed at [facility], [address] [place], [place] [address]  CBC with Diff     Status: Abnormal   Collection Time: 08/01/19  8:18 AM  Result Value Ref Range   WBC 9.6 4.0 - 10.5 K/uL   RBC 5.02 4.22 - 5.81 MIL/uL   Hemoglobin 13.8 13.0 - 17.0 g/dL   HCT 78.241.9 95.639.0 - 21.352.0 %   MCV 83.5 80.0 - 100.0 fL   MCH 27.5 26.0 - 34.0 pg   MCHC 32.9 30.0 - 36.0 g/dL   RDW 08.618.2 (H) 57.811.5 - 46.915.5 %   Platelets 245 150 - 400 K/uL   nRBC 0.0 0.0 - 0.2 %   Neutrophils Relative % 71 %   Neutro Abs 6.8 1.7 - 7.7 K/uL   Lymphocytes Relative 20 %   Lymphs Abs 1.9 0.7 - 4.0 K/uL   Monocytes Relative 9 %   Monocytes Absolute 0.9 0.1 - 1.0 K/uL   Eosinophils Relative 0 %   Eosinophils Absolute 0.0 0.0 - 0.5 K/uL   Basophils  Relative 0 %   Basophils Absolute 0.0 0.0 - 0.1 K/uL   Immature Granulocytes 0 %   Abs Immature Granulocytes 0.03 0.00 - 0.07 K/uL    Comment: Performed at [facility], [address] [place], [place] [address]  Lactic acid, plasma     Status: None   Collection Time: 08/01/19  8:18 AM  Result Value Ref Range   Lactic Acid, Venous 1.8 0.5 - 1.9 mmol/L    Comment: Performed at [facility], [address] [place], [place] [address]  Culture, blood (routine x 2)     Status: None (Preliminary result)   Collection Time: 08/01/19  8:18 AM   Specimen: Right Antecubital; Blood  Result Value Ref Range   Specimen Description      RIGHT ANTECUBITAL Performed at [facility], [address] [place], [place] [address]    Special Requests      BOTTLES DRAWN AEROBIC AND ANAEROBIC Blood Culture adequate volume Performed at [facility], [address], [place], [place] [address]    Culture      NO GROWTH < 24 HOURS Performed at [facility], [address] [place], [place] [address]    Report Status PENDING   SARS Coronavirus 2 [facility] order, Performed in [facility] hospital lab) Nasopharyngeal Nasopharyngeal Swab     Status: None   Collection Time: 08/01/19  8:18 AM   Specimen: Nasopharyngeal Swab  Result Value Ref Range   SARS Coronavirus 2 NEGATIVE NEGATIVE    Comment: (NOTE) If result is NEGATIVE SARS-CoV-2 target nucleic acids are NOT DETECTED. The SARS-CoV-2 RNA is generally detectable in upper and lower  respiratory specimens during the acute phase of infection. The lowest  concentration of SARS-CoV-2 viral copies this assay can detect is 250  copies / mL. A negative result does not preclude SARS-CoV-2 infection  and should not be used as the sole basis for treatment or other  patient management decisions.  A negative result may occur with  improper specimen collection / handling, submission of  specimen other  than nasopharyngeal swab, presence of viral mutation(s) within the  areas targeted by this assay, and inadequate number of viral copies  (<250 copies / mL). A negative result must be combined with clinical  observations, patient history, and epidemiological information. If result is POSITIVE SARS-CoV-2 target nucleic acids are DETECTED. The SARS-CoV-2 RNA is generally detectable in upper and lower  respiratory specimens dur ing the acute phase of infection.  Positive  results are indicative of active infection with SARS-CoV-2.  Clinical  correlation with patient history and other diagnostic information is  necessary to determine patient infection status.  Positive results do  not rule out bacterial infection or co-infection with other viruses. If result is PRESUMPTIVE POSTIVE SARS-CoV-2 nucleic acids MAY BE PRESENT.   A presumptive positive result was obtained on the submitted specimen  and confirmed on repeat testing.  While 2019 novel coronavirus  (SARS-CoV-2) nucleic acids may be present in the submitted sample  additional confirmatory testing may be necessary for epidemiological  and / or clinical management purposes  to differentiate between  SARS-CoV-2 and other Sarbecovirus currently known to infect humans.  If clinically indicated additional testing with an alternate test  methodology [phone]) is advised. The SARS-CoV-2 RNA is generally  detectable in upper and lower respiratory sp ecimens during the acute  phase of infection. The expected result is Negative. Fact Sheet for Patients:  BoilerBrush.com.cy Fact Sheet for Healthcare Providers: https://pope.com/ This test is not yet approved or cleared by the [place] FDA and has been authorized for detection and/or diagnosis of SARS-CoV-2 by FDA under an Emergency Use Authorization (EUA).  This EUA will  remain in effect (meaning this test can be used) for the  duration of the COVID-19 declaration under Section 564(b)(1) of the Act, 21 U.S.C. section 360bbb-3(b)(1), unless the authorization is terminated or revoked sooner. Performed at [facility], [address] [place], [place] [address]   Protime-INR     Status: None   Collection Time: 08/01/19 11:00 AM  Result Value Ref Range   Prothrombin Time 14.7 11.4 - 15.2 seconds   INR 1.2 0.8 - 1.2    Comment: (NOTE) INR goal varies based on device and disease states. Performed at [facility], [address] [place], [place] [address]   APTT     Status: None   Collection Time: 08/01/19 11:00 AM  Result Value Ref Range   aPTT 31 24 - 36 seconds    Comment: Performed at [facility], [address] [place], [place] [address]  Urine rapid drug screen (hosp performed)     Status: Abnormal   Collection Time: 08/01/19 11:09 AM  Result Value Ref Range   Opiates NONE DETECTED NONE DETECTED   Cocaine NONE DETECTED NONE DETECTED   Benzodiazepines NONE DETECTED NONE DETECTED   Amphetamines NONE DETECTED NONE DETECTED   Tetrahydrocannabinol POSITIVE (A) NONE DETECTED   Barbiturates NONE DETECTED NONE DETECTED    Comment: (NOTE) DRUG SCREEN FOR MEDICAL PURPOSES ONLY.  IF CONFIRMATION IS NEEDED FOR ANY PURPOSE, NOTIFY LAB WITHIN 5 DAYS. LOWEST DETECTABLE LIMITS FOR URINE DRUG SCREEN Drug Class                     Cutoff (ng/mL) Amphetamine and metabolites    1000 Barbiturate and metabolites    200 Benzodiazepine                 200 Tricyclics and metabolites     300 Opiates and metabolites        300 Cocaine and metabolites        300 THC                            50 Performed at [facility], [address], [place], [place] [address]   Heparin level (unfractionated)     Status: Abnormal   Collection Time: 08/01/19  8:16 PM  Result Value Ref Range   Heparin Unfractionated 0.18 (L) 0.30 - 0.70 IU/mL     Comment: (NOTE) If heparin results are below expected values, and patient dosage has  been confirmed, suggest follow up testing of antithrombin III levels. Performed at [facility], [address], [place], [place] [address]   Comprehensive metabolic panel     Status: Abnormal   Collection Time: 08/01/19  8:16 PM  Result Value Ref Range   Sodium 128 (L) 135 - 145 mmol/L   Potassium 3.5 3.5 - 5.1 mmol/L   Chloride 93 (L) 98 - 111 mmol/L   CO2 23 22 - 32 mmol/L   Glucose, Bld 114 (H) 70 - 99 mg/dL   BUN 10 6 - 20 mg/dL   Creatinine, Ser 1.30 0.61 - 1.24 mg/dL   Calcium 7.6 (L) 8.9 - 10.3 mg/dL   Total Protein 6.7 6.5 - 8.1 g/dL   Albumin 2.8 (L) 3.5 - 5.0 g/dL   AST 28 15 - 41 U/L   ALT 11 0 - 44 U/L   Alkaline Phosphatase 90 38 - 126 U/L   Total Bilirubin  1.2 0.3 - 1.2 mg/dL   GFR calc non Af Amer >60 >60 mL/min   GFR calc Af Amer >60 >60 mL/min   Anion gap 12 5 - 15    Comment: Performed at [facility], [address] [place], [place] [address]  Magnesium     Status: Abnormal   Collection Time: 08/01/19  8:16 PM  Result Value Ref Range   Magnesium 1.4 (L) 1.7 - 2.4 mg/dL    Comment: Performed at [facility], [address] [place], [place] [address]  Phosphorus     Status: None   Collection Time: 08/01/19  8:16 PM  Result Value Ref Range   Phosphorus 2.6 2.5 - 4.6 mg/dL    Comment: Performed at [facility], [address], [place], [place] [address]  CBC     Status: Abnormal   Collection Time: 08/01/19  8:16 PM  Result Value Ref Range   WBC 8.9 4.0 - 10.5 K/uL   RBC 4.20 (L) 4.22 - 5.81 MIL/uL   Hemoglobin 11.6 (L) 13.0 - 17.0 g/dL   HCT 91.4 (L) 78.2 - 95.6 %   MCV 84.0 80.0 - 100.0 fL   MCH 27.6 26.0 - 34.0 pg   MCHC 32.9 30.0 - 36.0 g/dL   RDW 21.3 (H) 08.6 - 57.8 %   Platelets 227 150 - 400 K/uL   nRBC 0.0 0.0 - 0.2 %    Comment: Performed at [facility], [address], [place], [place] [address]  CBC     Status: Abnormal   Collection Time: 08/02/19  6:39 AM  Result Value Ref Range   WBC 6.7 4.0 - 10.5 K/uL   RBC 4.16 (L) 4.22 - 5.81 MIL/uL   Hemoglobin 11.7 (L) 13.0 - 17.0 g/dL   HCT 95.2 (L) 84.1 - 32.4 %   MCV 85.8 80.0 - 100.0 fL   MCH 28.1 26.0 - 34.0 pg   MCHC 32.8 30.0 - 36.0 g/dL   RDW 40.1 (H) 02.7 - 25.3 %   Platelets 201 150 - 400 K/uL   nRBC 0.0 0.0 - 0.2 %    Comment: Performed at [facility], [address] [place], [place] [address]  Heparin level (unfractionated)     Status: None   Collection Time: 08/02/19  6:39 AM  Result Value Ref Range   Heparin Unfractionated 0.39 0.30 - 0.70 IU/mL    Comment: (NOTE) If heparin results are below expected values, and patient dosage has  been confirmed, suggest follow up testing of antithrombin III levels. Performed at [facility], [address], [place], [place] [address]   Comprehensive metabolic panel     Status: Abnormal   Collection Time: 08/02/19  6:39 AM  Result Value Ref Range   Sodium 131 (L) 135 - 145 mmol/L   Potassium 2.9 (L) 3.5 - 5.1 mmol/L    Comment: DELTA CHECK NOTED REPEATED TO VERIFY    Chloride 97 (L) 98 - 111 mmol/L   CO2 22 22 - 32 mmol/L   Glucose, Bld 88 70 - 99 mg/dL   BUN 7 6 - 20 mg/dL   Creatinine, Ser 5.95 (L) 0.61 - 1.24 mg/dL   Calcium 7.3 (L) 8.9 - 10.3 mg/dL   Total Protein 5.7 (L) 6.5 - 8.1 g/dL   Albumin 2.5 (L) 3.5 - 5.0 g/dL   AST 14 (L) 15 - 41 U/L   ALT 7 0 - 44  U/L   Alkaline Phosphatase 76 38 - 126 U/L   Total Bilirubin 1.1 0.3 - 1.2 mg/dL   GFR calc non Af Amer >60 >60 mL/min   GFR calc Af Amer >60 >60 mL/min   Anion gap 12 5 - 15    Comment: Performed at [facility], [address] [place], [place] [address]   Dg Chest 2 View  Result Date: 08/01/2019 CLINICAL DATA:  Shortness of breath. Right-sided chest pain. EXAM: CHEST - 2 VIEW COMPARISON:  05/16/2019 FINDINGS:  Normal heart size and mediastinal contours. Mild patchy opacity in the right lung base and infrahilar lung. Left lung is clear. No pulmonary edema. No definite pleural effusion. No pneumothorax. No acute osseous abnormalities are seen. IMPRESSION: Mild patchy opacity in the right lung base, may reflect atelectasis or pneumonia. Electronically Signed   By: Narda RutherfordMelanie  Sanford M.D.   On: 08/01/2019 02:20   Ct Angio Chest Pe W And/or Wo Contrast  Result Date: 08/01/2019 CLINICAL DATA:  Shortness of breath and chest pain.  Fever. EXAM: CT ANGIOGRAPHY CHEST WITH CONTRAST TECHNIQUE: Multidetector CT imaging of the chest was performed using the standard protocol during bolus administration of intravenous contrast. Multiplanar CT image reconstructions and MIPs were obtained to evaluate the vascular anatomy. CONTRAST:  100mL OMNIPAQUE IOHEXOL 350 MG/ML SOLN COMPARISON:  Chest radiograph August 01, 2019 FINDINGS: Cardiovascular: There are multiple pulmonary emboli involving multiple lower lobe pulmonary artery branches bilaterally. There is a slightly greater degree of pulmonary embolic change in the right lower lobe and left lower lobe. There is an incompletely obstructing pulmonary embolus in the posterior segment right upper lobe pulmonary artery branch. No central pulmonary embolus is evident. There is no right heart strain; the right ventricle to left ventricle diameter ratio is less than 0.9. There is no demonstrable thoracic aortic aneurysm or dissection. The visualized great vessels appear unremarkable. There are multiple foci of coronary artery calcification. There is no appreciable pericardial effusion or pericardial thickening. Mediastinum/Nodes: Thyroid appears normal. There is a 1.5 x 1.3 cm subcarinal lymph node. There is a right hilar lymph node measuring 1.3 x 1.1 cm. No esophageal lesions are evident. Lungs/Pleura: There is a fairly small right pleural effusion. There is airspace consolidation consistent  with pneumonia involving portions of the superior and lateral segments of the right lower lobe. There is a cavitary lesion in the posterior segment of the right lower lobe measuring 3.8 x 2.6 cm. The lungs elsewhere are clear. Upper Abdomen: Visualized upper abdominal structures appear normal. Musculoskeletal: There is degenerative change in the thoracic spine. No blastic or lytic bone lesions are evident. There are no chest wall lesions. Review of the MIP images confirms the above findings. IMPRESSION: 1.  Multiple pulmonary emboli.  No right heart strain. 2. Cavitary lesion in the posterior segment right lower lobe. Question cavitary pneumonia versus cavitary neoplasm. 3. Fairly small right pleural effusion. Airspace consolidation consistent with pneumonia involving portions of the superior and lateral segments of the right lower lobe. 4. Prominent right hilar and subcarinal lymph nodes. Neoplastic etiology for this adenopathy cannot be excluded. 5.  Multiple foci of coronary artery calcification. Critical Value/emergent results were called by telephone at the time of interpretation on 08/01/2019 at 12:03 pm to providerWYLDER FONDAW , who verbally acknowledged these results. Electronically Signed   By: Bretta BangWilliam  Woodruff III M.D.   On: 08/01/2019 12:05   Vas [place] Lower Extremity Venous (dvt)  Result Date: 08/01/2019  Lower Venous Study Indications: Pain,  and Swelling.  Risk Factors: None identified. Anticoagulation: Heparin. Comparison Study: No prior studies. Performing Technologist: Chanda Busing RVT  Examination Guidelines: A complete evaluation includes B-mode imaging, spectral Doppler, color Doppler, and power Doppler as needed of all accessible portions of each vessel. Bilateral testing is considered an integral part of a complete examination. Limited examinations for reoccurring indications may be performed as noted.  +---------+---------------+---------+-----------+----------+--------------+ RIGHT     CompressibilityPhasicitySpontaneityPropertiesThrombus Aging +---------+---------------+---------+-----------+----------+--------------+ CFV      Full           Yes      Yes                                 +---------+---------------+---------+-----------+----------+--------------+ SFJ      Full                                                        +---------+---------------+---------+-----------+----------+--------------+ FV Prox  Full                                                        +---------+---------------+---------+-----------+----------+--------------+ FV Mid   Full                                                        +---------+---------------+---------+-----------+----------+--------------+ FV DistalFull                                                        +---------+---------------+---------+-----------+----------+--------------+ PFV      Full                                                        +---------+---------------+---------+-----------+----------+--------------+ POP      Partial        No       No                   Acute          +---------+---------------+---------+-----------+----------+--------------+ PTV      None                                         Acute          +---------+---------------+---------+-----------+----------+--------------+ PERO     Full                                                        +---------+---------------+---------+-----------+----------+--------------+  Gastroc  Partial                                      Acute          +---------+---------------+---------+-----------+----------+--------------+   +---------+---------------+---------+-----------+----------+--------------+ LEFT     CompressibilityPhasicitySpontaneityPropertiesThrombus Aging +---------+---------------+---------+-----------+----------+--------------+ CFV      Full           Yes      Yes                                  +---------+---------------+---------+-----------+----------+--------------+ SFJ      Full                                                        +---------+---------------+---------+-----------+----------+--------------+ FV Prox  Full                                                        +---------+---------------+---------+-----------+----------+--------------+ FV Mid   Full                                                        +---------+---------------+---------+-----------+----------+--------------+ FV DistalFull                                                        +---------+---------------+---------+-----------+----------+--------------+ PFV      Full                                                        +---------+---------------+---------+-----------+----------+--------------+ POP      Full           Yes      Yes                                 +---------+---------------+---------+-----------+----------+--------------+ PTV      Full                                                        +---------+---------------+---------+-----------+----------+--------------+ PERO     Full                                                        +---------+---------------+---------+-----------+----------+--------------+  Summary: Right: Findings consistent with acute deep vein thrombosis involving the right popliteal vein, right posterior tibial veins, and right gastrocnemius veins. No cystic structure found in the popliteal fossa. Left: There is no evidence of deep vein thrombosis in the lower extremity. No cystic structure found in the popliteal fossa.  *See table(s) above for measurements and observations. Electronically signed by Ruta Hinds MD on 08/01/2019 at 4:08:12 PM.    Final     Pending Labs Unresulted Labs (From admission, onward)    Start     Ordered   08/02/19 0500  CBC  Daily,   R     08/01/19 1247   67/12/45 8099   CYCLIC CITRUL PEPTIDE ANTIBODY, IGG/IGA  Once,   STAT     08/01/19 1402   08/01/19 1402  Rheumatoid factor  Once,   STAT     08/01/19 1402   08/01/19 1402  ANA, IFA (with reflex)  Once,   STAT     08/01/19 1402   08/01/19 0759  Urine culture  ONCE - STAT,   STAT     08/01/19 0759   08/01/19 0747  Culture, blood (routine x 2)  BLOOD CULTURE X 2,   STAT     08/01/19 0746          Vitals/Pain Today's Vitals   08/02/19 0300 08/02/19 0330 08/02/19 0351 08/02/19 0400  BP: (!) 143/89 (!) 150/88  (!) 176/92  Pulse: 89 90  (!) 115  Resp: (!) 21 (!) 39  (!) 28  Temp:      TempSrc:      SpO2: 100% 99%  98%  Weight:      Height:      PainSc:   9      Isolation Precautions No active isolations  Medications Medications  albuterol (PROVENTIL) (2.5 MG/3ML) 0.083% nebulizer solution 5 mg (5 mg Nebulization Not Given 08/01/19 0602)  sodium chloride 0.9 % bolus 1,000 mL (0 mLs Intravenous Stopped 08/01/19 1309)    And  0.9 %  sodium chloride infusion (has no administration in time range)  sodium chloride (PF) 0.9 % injection (0 mLs  Hold 08/01/19 1630)  heparin ADULT infusion 100 units/mL (25000 units/236mL sodium chloride 0.45%) (2,000 Units/hr Intravenous Rate/Dose Change 08/01/19 2148)  0.9 %  sodium chloride infusion (has no administration in time range)  acetaminophen (TYLENOL) tablet 650 mg (has no administration in time range)    Or  acetaminophen (TYLENOL) suppository 650 mg (has no administration in time range)  HYDROcodone-acetaminophen (NORCO/VICODIN) 5-325 MG per tablet 1-2 tablet (2 tablets Oral Given 08/02/19 0358)  docusate sodium (COLACE) capsule 100 mg (100 mg Oral Not Given 08/02/19 0401)  ondansetron (ZOFRAN) tablet 4 mg (has no administration in time range)    Or  ondansetron (ZOFRAN) injection 4 mg (has no administration in time range)  thiamine (VITAMIN B-1) tablet 100 mg (has no administration in time range)  guaiFENesin (MUCINEX) 12 hr tablet 600 mg (600 mg Oral Not  Given 08/02/19 0401)  albuterol (PROVENTIL) (2.5 MG/3ML) 0.083% nebulizer solution 2.5 mg (has no administration in time range)  Ampicillin-Sulbactam (UNASYN) 3 g in sodium chloride 0.9 % 100 mL IVPB (3 g Intravenous New Bag/Given 08/02/19 0417)  LORazepam (ATIVAN) tablet 1-4 mg (2 mg Oral Given 08/02/19 0358)    Or  LORazepam (ATIVAN) injection 1-4 mg ( Intravenous See Alternative 08/02/19 0358)  thiamine (VITAMIN B-1) tablet 100 mg (100 mg Oral Not Given 08/02/19 0358)    Or  thiamine (  B-1) injection 100 mg ( Intravenous See Alternative 08/02/19 0358)  folic acid (FOLVITE) tablet 1 mg (1 mg Oral Not Given 08/02/19 0400)  multivitamin with minerals tablet 1 tablet (1 tablet Oral Not Given 08/02/19 0400)  LORazepam (ATIVAN) injection 0-4 mg (0 mg Intravenous Hold 08/01/19 2357)    Followed by  LORazepam (ATIVAN) injection 0-4 mg (has no administration in time range)  metoCLOPramide (REGLAN) injection 10 mg (10 mg Intravenous Given 08/01/19 0816)  acetaminophen (TYLENOL) tablet 650 mg (650 mg Oral Given 08/01/19 0816)  sodium chloride 0.9 % bolus 1,000 mL (0 mLs Intravenous Stopped 08/01/19 1101)  cefTRIAXone (ROCEPHIN) 1 g in sodium chloride 0.9 % 100 mL IVPB (0 g Intravenous Stopped 08/01/19 1101)  azithromycin (ZITHROMAX) tablet 500 mg (500 mg Oral Given 08/01/19 0821)  iohexol (OMNIPAQUE) 350 MG/ML injection 100 mL (100 mLs Intravenous Contrast Given 08/01/19 1128)  HYDROmorphone (DILAUDID) injection 1 mg (1 mg Intravenous Given 08/01/19 1241)  heparin bolus via infusion 6,500 Units (6,500 Units Intravenous Bolus from Bag 08/01/19 1402)  heparin bolus via infusion 4,000 Units (4,000 Units Intravenous Bolus from Bag 08/01/19 2147)    Mobility walks with device Moderate fall risk   Focused Assessments    R Recommendations: See Admitting Provider Note  Report given to:   Additional Notes:

## 2019-08-02 NOTE — Progress Notes (Signed)
PROGRESS NOTE    Richard Lopez  HEN:[id number] DOB: [birth year] DOA: 08/01/2019 PCP: [name], NP    Brief Narrative:  43 year old gentleman with history of alcohol abuse, recurrent fall with recent multiple fractures, diet-controlled diabetes, hypertension presented to the emergency room with right upper back pain for 2 days.  Also had some cough and low-grade temperature at home. Patient had left tibia-fibula fracture 11/2018 , right hip fracture, 03/2019, periprosthetic right hip fracture, 05/2019 and mostly on crutches since then.  In the emergency room he was found to have bilateral PE and cavitary pneumonia as well.   Assessment & Plan:   Principal Problem:   Cavitary lesion of lung Active Problems:   Type 2 diabetes mellitus with hyperlipidemia (HCC)   Essential hypertension   Marijuana abuse   Metabolic acidosis   Alcohol use  Cavitary lesion of the lung: Not present on CT scans from previous admissions.  Discussed with ID and pulmonary on admission. Suspected cavitary pneumonia. Antibiotics to treat bacterial pneumonia, starting on Unasyn. Chest physiotherapy, incentive spirometry, deep breathing exercises, sputum induction, mucolytic's and bronchodilators. Sputum cultures, blood cultures. Supplemental oxygen to keep saturations more than 90%.  Acute pulmonary embolism: CT scan shows bilateral multifocal pulmonary embolism.  Patient on room air.  No right heart strain.  Patient was started on heparin infusion, remains fairly stable, will change to Lovenox today.  We will discussed with patient about oral anticoagulation by tomorrow if remains a stable.  Recurrent falls: No organic cause found.  Thought to be related to alcoholism.  Currently stable.  No new fractures.  Alcohol abuse: Patient denies everyday use of alcohol.  Currently on benzodiazepine based CIWA protocol.  On multivitamins.  Monitor.  Hypokalemia: We will replace and recheck levels in the  morning.  Hypomagnesemia: We will replace and recheck level in the morning.  DVT prophylaxis: Lovenox subcu Code Status: Full code Family Communication: None Disposition Plan: Home when stable   Consultants:   None.  Infectious disease and pulmonary, case discussed and on admission by admitting doctor  Procedures:   None  Antimicrobials:   Augmentin, 07/24/2019---   Subjective: Patient was seen and examined.  He is stayed in the emergency room overnight and just arrived to the floor.  Still has right posterior chest wall pain, pain worse on deep breathing.  Afebrile overnight.  On room air.  Denies any nausea vomiting.  Objective: Vitals:   08/02/19 0300 08/02/19 0330 08/02/19 0400 08/02/19 0859  BP: (!) 143/89 (!) 150/88 (!) 176/92 (!) 141/80  Pulse: 89 90 (!) 115 97  Resp: (!) 21 (!) 39 (!) 28 18  Temp:    98.4 F (36.9 C)  TempSrc:    Oral  SpO2: 100% 99% 98% 100%  Weight:      Height:        Intake/Output Summary (Last 24 hours) at 08/02/2019 1222 Last data filed at 08/02/2019 0915 Gross per 24 hour  Intake 360 ml  Output 200 ml  Net 160 ml   Filed Weights   08/01/19 0147 08/01/19 1244  Weight: 97.5 kg 99.8 kg    Examination:  General exam: Appears calm and comfortable  Respiratory system: Clear to auscultation. Respiratory effort normal. Cardiovascular system: S1 & S2 heard, RRR. No JVD, murmurs, rubs, gallops or clicks. No pedal edema. Gastrointestinal system: Abdomen is nondistended, soft and nontender. No organomegaly or masses felt. Normal bowel sounds heard. Central nervous system: Alert and oriented. No focal neurological deficits. Extremities: Symmetric  5 x 5 power.  Deformity right upper area of the knee. Skin: No rashes, lesions or ulcers Psychiatry: Judgement and insight appear normal. Mood & affect appropriate.     Data Reviewed: I have personally reviewed following labs and imaging studies  CBC: Recent Labs  Lab 08/01/19 0818  08/01/19 2016 08/02/19 0639  WBC 9.6 8.9 6.7  NEUTROABS 6.8  --   --   HGB 13.8 11.6* 11.7*  HCT 41.9 35.3* 35.7*  MCV 83.5 84.0 85.8  PLT 245 227 201   Basic Metabolic Panel: Recent Labs  Lab 08/01/19 0818 08/01/19 2016 08/02/19 0639  NA 128* 128* 131*  K 3.8 3.5 2.9*  CL 88* 93* 97*  CO2 24 23 22   GLUCOSE 131* 114* 88  BUN 13 10 7   CREATININE 0.77 0.63 0.56*  CALCIUM 8.4* 7.6* 7.3*  MG  --  1.4*  --   PHOS  --  2.6  --    GFR: Estimated Creatinine Clearance: 155.5 mL/min (A) (by C-G formula based on SCr of 0.56 mg/dL (L)). Liver Function Tests: Recent Labs  Lab 08/01/19 0818 08/01/19 2016 08/02/19 0639  AST 20 28 14*  ALT 9 11 7   ALKPHOS 114 90 76  BILITOT 2.2* 1.2 1.1  PROT 7.9 6.7 5.7*  ALBUMIN 3.5 2.8* 2.5*   Recent Labs  Lab 08/01/19 0818  LIPASE 19   No results for input(s): AMMONIA in the last 168 hours. Coagulation Profile: Recent Labs  Lab 08/01/19 1100  INR 1.2   Cardiac Enzymes: No results for input(s): CKTOTAL, CKMB, CKMBINDEX, TROPONINI in the last 168 hours. BNP (last 3 results) No results for input(s): PROBNP in the last 8760 hours. HbA1C: No results for input(s): HGBA1C in the last 72 hours. CBG: No results for input(s): GLUCAP in the last 168 hours. Lipid Profile: No results for input(s): CHOL, HDL, LDLCALC, TRIG, CHOLHDL, LDLDIRECT in the last 72 hours. Thyroid Function Tests: No results for input(s): TSH, T4TOTAL, FREET4, T3FREE, THYROIDAB in the last 72 hours. Anemia Panel: No results for input(s): VITAMINB12, FOLATE, FERRITIN, TIBC, IRON, RETICCTPCT in the last 72 hours. Sepsis Labs: Recent Labs  Lab 08/01/19 0818  LATICACIDVEN 1.8    Recent Results (from the past 240 hour(s))  Culture, blood (routine x 2)     Status: None (Preliminary result)   Collection Time: 08/01/19  8:18 AM   Specimen: Right Antecubital; Blood  Result Value Ref Range Status   Specimen Description   Final    RIGHT ANTECUBITAL Performed at  [facility], [address] [place], [place] [address]    Special Requests   Final    BOTTLES DRAWN AEROBIC AND ANAEROBIC Blood Culture adequate volume Performed at [facility], [address] [place], [place] [address]    Culture   Final    NO GROWTH < 24 HOURS Performed at [facility], [address] [place], [place] [address]    Report Status PENDING  Incomplete  SARS Coronavirus 2 [facility](Hospital order, Performed in [facility] hospital lab) Nasopharyngeal Nasopharyngeal Swab     Status: None   Collection Time: 08/01/19  8:18 AM   Specimen: Nasopharyngeal Swab  Result Value Ref Range Status   SARS Coronavirus 2 NEGATIVE NEGATIVE Final    Comment: (NOTE) If result is NEGATIVE SARS-CoV-2 target nucleic acids are NOT DETECTED. The SARS-CoV-2 RNA is generally detectable in upper and lower  respiratory specimens during the acute phase of infection. The lowest  concentration  of SARS-CoV-2 viral copies this assay can detect is 250  copies / mL. A negative result does not preclude SARS-CoV-2 infection  and should not be used as the sole basis for treatment or other  patient management decisions.  A negative result may occur with  improper specimen collection / handling, submission of specimen other  than nasopharyngeal swab, presence of viral mutation(s) within the  areas targeted by this assay, and inadequate number of viral copies  (<250 copies / mL). A negative result must be combined with clinical  observations, patient history, and epidemiological information. If result is POSITIVE SARS-CoV-2 target nucleic acids are DETECTED. The SARS-CoV-2 RNA is generally detectable in upper and lower  respiratory specimens dur ing the acute phase of infection.  Positive  results are indicative of active infection with SARS-CoV-2.  Clinical  correlation with patient history and other diagnostic information is  necessary to determine patient  infection status.  Positive results do  not rule out bacterial infection or co-infection with other viruses. If result is PRESUMPTIVE POSTIVE SARS-CoV-2 nucleic acids MAY BE PRESENT.   A presumptive positive result was obtained on the submitted specimen  and confirmed on repeat testing.  While 2019 novel coronavirus  (SARS-CoV-2) nucleic acids may be present in the submitted sample  additional confirmatory testing may be necessary for epidemiological  and / or clinical management purposes  to differentiate between  SARS-CoV-2 and other Sarbecovirus currently known to infect humans.  If clinically indicated additional testing with an alternate test  methodology [phone] is advised. The SARS-CoV-2 RNA is generally  detectable in upper and lower respiratory sp ecimens during the acute  phase of infection. The expected result is Negative. Fact Sheet for Patients:  BoilerBrush.com.cyhttps://www.fda.gov/media/136312/download Fact Sheet for Healthcare Providers: https://pope.com/https://www.fda.gov/media/136313/download This test is not yet approved or cleared by the [place] FDA and has been authorized for detection and/or diagnosis of SARS-CoV-2 by FDA under an Emergency Use Authorization (EUA).  This EUA will remain in effect (meaning this test can be used) for the duration of the COVID-19 declaration under Section 564(b)(1) of the Act, 21 U.S.C. section 360bbb-3(b)(1), unless the authorization is terminated or revoked sooner. Performed at [facility], [address] [place], [place] [address]   Urine culture     Status: None   Collection Time: 08/01/19  8:18 AM   Specimen: In/Out Cath Urine  Result Value Ref Range Status   Specimen Description   Final    IN/OUT CATH URINE Performed at [facility], [address] [place], [place] [address]    Special Requests   Final    NONE Performed at [facility], [address] [place], [place] [address]     Culture   Final    NO GROWTH Performed at [facility], [address] [place], [place] [address]    Report Status 08/02/2019 FINAL  Final         Radiology Studies: Dg Chest 2 View  Result Date: 08/01/2019 CLINICAL DATA:  Shortness of breath. Right-sided chest pain. EXAM: CHEST - 2 VIEW COMPARISON:  05/16/2019 FINDINGS: Normal heart size and mediastinal contours. Mild patchy opacity in the right lung base and infrahilar lung. Left lung is clear. No pulmonary edema. No definite pleural effusion. No pneumothorax. No acute osseous abnormalities are seen. IMPRESSION: Mild patchy opacity in the right lung base, may reflect atelectasis or pneumonia. Electronically Signed   By: Narda RutherfordMelanie  Sanford M.D.   On: 08/01/2019  02:20   Ct Angio Chest Pe W And/or Wo Contrast  Result Date: 08/01/2019 CLINICAL DATA:  Shortness of breath and chest pain.  Fever. EXAM: CT ANGIOGRAPHY CHEST WITH CONTRAST TECHNIQUE: Multidetector CT imaging of the chest was performed using the standard protocol during bolus administration of intravenous contrast. Multiplanar CT image reconstructions and MIPs were obtained to evaluate the vascular anatomy. CONTRAST:  153mL OMNIPAQUE IOHEXOL 350 MG/ML SOLN COMPARISON:  Chest radiograph August 01, 2019 FINDINGS: Cardiovascular: There are multiple pulmonary emboli involving multiple lower lobe pulmonary artery branches bilaterally. There is a slightly greater degree of pulmonary embolic change in the right lower lobe and left lower lobe. There is an incompletely obstructing pulmonary embolus in the posterior segment right upper lobe pulmonary artery branch. No central pulmonary embolus is evident. There is no right heart strain; the right ventricle to left ventricle diameter ratio is less than 0.9. There is no demonstrable thoracic aortic aneurysm or dissection. The visualized great vessels appear unremarkable. There are multiple foci of coronary artery calcification. There is no  appreciable pericardial effusion or pericardial thickening. Mediastinum/Nodes: Thyroid appears normal. There is a 1.5 x 1.3 cm subcarinal lymph node. There is a right hilar lymph node measuring 1.3 x 1.1 cm. No esophageal lesions are evident. Lungs/Pleura: There is a fairly small right pleural effusion. There is airspace consolidation consistent with pneumonia involving portions of the superior and lateral segments of the right lower lobe. There is a cavitary lesion in the posterior segment of the right lower lobe measuring 3.8 x 2.6 cm. The lungs elsewhere are clear. Upper Abdomen: Visualized upper abdominal structures appear normal. Musculoskeletal: There is degenerative change in the thoracic spine. No blastic or lytic bone lesions are evident. There are no chest wall lesions. Review of the MIP images confirms the above findings. IMPRESSION: 1.  Multiple pulmonary emboli.  No right heart strain. 2. Cavitary lesion in the posterior segment right lower lobe. Question cavitary pneumonia versus cavitary neoplasm. 3. Fairly small right pleural effusion. Airspace consolidation consistent with pneumonia involving portions of the superior and lateral segments of the right lower lobe. 4. Prominent right hilar and subcarinal lymph nodes. Neoplastic etiology for this adenopathy cannot be excluded. 5.  Multiple foci of coronary artery calcification. Critical Value/emergent results were called by telephone at the time of interpretation on 08/01/2019 at 12:03 pm to [place] , who verbally acknowledged these results. Electronically Signed   By: Lowella Grip III M.D.   On: 08/01/2019 12:05   Vas [place] Lower Extremity Venous (dvt)  Result Date: 08/01/2019  Lower Venous Study Indications: Pain, and Swelling.  Risk Factors: None identified. Anticoagulation: Heparin. Comparison Study: No prior studies. Performing Technologist: Oliver Hum RVT  Examination Guidelines: A complete evaluation includes B-mode  imaging, spectral Doppler, color Doppler, and power Doppler as needed of all accessible portions of each vessel. Bilateral testing is considered an integral part of a complete examination. Limited examinations for reoccurring indications may be performed as noted.  +---------+---------------+---------+-----------+----------+--------------+  RIGHT     Compressibility Phasicity Spontaneity Properties Thrombus Aging  +---------+---------------+---------+-----------+----------+--------------+  CFV       Full            Yes       Yes                                    +---------+---------------+---------+-----------+----------+--------------+  SFJ       Full                                                             +---------+---------------+---------+-----------+----------+--------------+  FV Prox   Full                                                             +---------+---------------+---------+-----------+----------+--------------+  FV Mid    Full                                                             +---------+---------------+---------+-----------+----------+--------------+  FV Distal Full                                                             +---------+---------------+---------+-----------+----------+--------------+  PFV       Full                                                             +---------+---------------+---------+-----------+----------+--------------+  POP       Partial         No        No                     Acute           +---------+---------------+---------+-----------+----------+--------------+  PTV       None                                             Acute           +---------+---------------+---------+-----------+----------+--------------+  PERO      Full                                                             +---------+---------------+---------+-----------+----------+--------------+  Gastroc   Partial                                          Acute            +---------+---------------+---------+-----------+----------+--------------+   +---------+---------------+---------+-----------+----------+--------------+  LEFT      Compressibility Phasicity Spontaneity Properties Thrombus Aging  +---------+---------------+---------+-----------+----------+--------------+  CFV       Full            Yes       Yes                                    +---------+---------------+---------+-----------+----------+--------------+  SFJ       Full                                                             +---------+---------------+---------+-----------+----------+--------------+  FV Prox   Full                                                             +---------+---------------+---------+-----------+----------+--------------+  FV Mid    Full                                                             +---------+---------------+---------+-----------+----------+--------------+  FV Distal Full                                                             +---------+---------------+---------+-----------+----------+--------------+  PFV       Full                                                             +---------+---------------+---------+-----------+----------+--------------+  POP       Full            Yes       Yes                                    +---------+---------------+---------+-----------+----------+--------------+  PTV       Full                                                             +---------+---------------+---------+-----------+----------+--------------+  PERO      Full                                                             +---------+---------------+---------+-----------+----------+--------------+     Summary: Right: Findings consistent with acute deep vein thrombosis involving the right popliteal vein, right posterior tibial veins, and right gastrocnemius veins. No cystic structure found in the popliteal fossa. Left: There is no evidence of deep vein thrombosis in  the lower extremity. No cystic structure found in the popliteal fossa.  *See  table(s) above for measurements and observations. Electronically signed by Fabienne Bruns MD on 08/01/2019 at 4:08:12 PM.    Final         Scheduled Meds:  albuterol  5 mg Nebulization Once   docusate sodium  100 mg Oral BID   enoxaparin (LOVENOX) injection  100 mg Subcutaneous Q12H   folic acid  1 mg Oral Daily   guaiFENesin  600 mg Oral BID   LORazepam  0-4 mg Intravenous Q6H   Followed by   Melene Muller ON 08/03/2019] LORazepam  0-4 mg Intravenous Q12H   multivitamin with minerals  1 tablet Oral Daily   potassium chloride  40 mEq Oral BID   thiamine  100 mg Oral Daily   Or   thiamine  100 mg Intravenous Daily   Continuous Infusions:  ampicillin-sulbactam (UNASYN) IV 3 g (08/02/19 1013)   magnesium sulfate bolus IVPB       LOS: 1 day    Time spent: 30 minutes    Dorcas Carrow, MD [facility] Pager [phone]  If 7PM-7AM, please contact night-coverage [email or web address] Password [facility] 08/02/2019, 12:22 PM

## 2019-08-03 LAB — CBC WITH DIFFERENTIAL/PLATELET
Abs Immature Granulocytes: 0.02 10*3/uL (ref 0.00–0.07)
Basophils Absolute: 0 10*3/uL (ref 0.0–0.1)
Basophils Relative: 1 %
Eosinophils Absolute: 0 10*3/uL (ref 0.0–0.5)
Eosinophils Relative: 0 %
HCT: 32.2 % — ABNORMAL LOW (ref 39.0–52.0)
Hemoglobin: 10.5 g/dL — ABNORMAL LOW (ref 13.0–17.0)
Immature Granulocytes: 0 %
Lymphocytes Relative: 28 %
Lymphs Abs: 1.8 10*3/uL (ref 0.7–4.0)
MCH: 27.7 pg (ref 26.0–34.0)
MCHC: 32.6 g/dL (ref 30.0–36.0)
MCV: 85 fL (ref 80.0–100.0)
Monocytes Absolute: 0.6 10*3/uL (ref 0.1–1.0)
Monocytes Relative: 10 %
Neutro Abs: 3.8 10*3/uL (ref 1.7–7.7)
Neutrophils Relative %: 61 %
Platelets: 227 10*3/uL (ref 150–400)
RBC: 3.79 MIL/uL — ABNORMAL LOW (ref 4.22–5.81)
RDW: 17.8 % — ABNORMAL HIGH (ref 11.5–15.5)
WBC: 6.3 10*3/uL (ref 4.0–10.5)
nRBC: 0 % (ref 0.0–0.2)

## 2019-08-03 LAB — BASIC METABOLIC PANEL
Anion gap: 7 (ref 5–15)
BUN: 5 mg/dL — ABNORMAL LOW (ref 6–20)
CO2: 24 mmol/L (ref 22–32)
Calcium: 7.6 mg/dL — ABNORMAL LOW (ref 8.9–10.3)
Chloride: 100 mmol/L (ref 98–111)
Creatinine, Ser: 0.52 mg/dL — ABNORMAL LOW (ref 0.61–1.24)
GFR calc Af Amer: >60 mL/min (ref >60)
GFR calc non Af Amer: >60 mL/min (ref >60)
Glucose, Bld: 127 mg/dL — ABNORMAL HIGH (ref 70–99)
Potassium: 3.4 mmol/L — ABNORMAL LOW (ref 3.5–5.1)
Sodium: 131 mmol/L — ABNORMAL LOW (ref 135–145)

## 2019-08-03 LAB — RHEUMATOID FACTOR: Rheumatoid fact SerPl-aCnc: <10 IU/mL (ref 0.0–13.9)

## 2019-08-03 LAB — ANTINUCLEAR ANTIBODIES, IFA: ANA Ab, IFA: NEGATIVE

## 2019-08-03 LAB — MAGNESIUM: Magnesium: 1.6 mg/dL — ABNORMAL LOW (ref 1.7–2.4)

## 2019-08-03 MED ORDER — APIXABAN 5 MG PO TABS: 10.0000 mg | ORAL_TABLET | Freq: Two times a day (BID) | ORAL | Status: DC

## 2019-08-03 MED ORDER — MAGNESIUM OXIDE 400 (241.3 MG) MG PO TABS
400.0000 mg | ORAL_TABLET | Freq: Two times a day (BID) | ORAL | Status: DC
  Administered 2019-08-03 – 2019-08-05 (×5): 400 mg via ORAL
  Filled 2019-08-03 (×5): qty 1

## 2019-08-03 MED ORDER — APIXABAN 5 MG PO TABS
10.0000 mg | ORAL_TABLET | Freq: Two times a day (BID) | ORAL | Status: DC
  Administered 2019-08-03 – 2019-08-04 (×2): 10 mg via ORAL
  Filled 2019-08-03 (×2): qty 2

## 2019-08-03 MED ORDER — APIXABAN 5 MG PO TABS: 5.0000 mg | ORAL_TABLET | Freq: Two times a day (BID) | ORAL | Status: DC

## 2019-08-03 NOTE — Discharge Instructions (Addendum)
Follow up appointment- establishing primary care: 08/22/19 9:30am with Juluis Mire Please bring all medications you are taking and an ID. Address: [address], [place], [place] [address]   Prescriptions sent to [facility]. Eliquis 30 day supply coupon card provided by [facility]. You need to ask to apply for Patient Assistance Program for Eliquis while you are at the pharmacy for future assistance. [address],  [place]  [phone]   Information on my medicine - ELIQUIS (apixaban)  This medication education was reviewed with me or my healthcare representative as part of my discharge preparation.  The pharmacist that spoke with me during my hospital stay was:  Netta Cedars, [facility]  Why was Eliquis prescribed for you? Eliquis was prescribed to treat blood clots that may have been found in the veins of your legs (deep vein thrombosis) or in your lungs (pulmonary embolism) and to reduce the risk of them occurring again.  What do You need to know about Eliquis ? The starting dose is 10 mg (two 5 mg tablets) taken TWICE daily for the FIRST SEVEN (7) DAYS, then on 08/10/19 the dose is reduced to ONE 5 mg tablet taken TWICE daily.  Eliquis may be taken with or without food.   Try to take the dose about the same time in the morning and in the evening. If you have difficulty swallowing the tablet whole please discuss with your pharmacist how to take the medication safely.  Take Eliquis exactly as prescribed and DO NOT stop taking Eliquis without talking to the doctor who prescribed the medication.  Stopping may increase your risk of developing a new blood clot.  Refill your prescription before you run out.  After discharge, you should have regular check-up appointments with your healthcare provider that is prescribing your Eliquis.    What do you do if you miss a dose? If a dose  of ELIQUIS is not taken at the scheduled time, take it as soon as possible on the same day and twice-daily administration should be resumed. The dose should not be doubled to make up for a missed dose.  Important Safety Information A possible side effect of Eliquis is bleeding. You should call your healthcare provider right away if you experience any of the following: ? Bleeding from an injury or your nose that does not stop. ? Unusual colored urine (red or dark brown) or unusual colored stools (red or black). ? Unusual bruising for unknown reasons. ? A serious fall or if you hit your head (even if there is no bleeding).  Some medicines may interact with Eliquis and might increase your risk of bleeding or clotting while on Eliquis. To help avoid this, consult your healthcare provider or pharmacist prior to using any new prescription or non-prescription medications, including herbals, vitamins, non-steroidal anti-inflammatory drugs (NSAIDs) and supplements.  This website has more information on Eliquis (apixaban): http://www.eliquis.com/eliquis/home

## 2019-08-03 NOTE — Evaluation (Signed)
Physical Therapy Evaluation Patient Details Name: Richard Lopez MRN: [id number] DOB: [birth year] Today's Date: 08/03/2019   History of Present Illness  43 year old gentleman with history of alcohol abuse, recurrent fall with recent multiple fractures, diet-controlled diabetes, hypertension presented to the emergency room with right upper back pain for 2 days. Recent fractures include: L tib/fib 11/2018,  R hip 03/2019, R periprosthetic fx 05/2019. Multiple falls suspected to be 2* EtOH.  Clinical Impression  Pt ambulated 200' with single crutch on R. He reports no falls since 05/2019. Pt ambulates with wide base of support (2* EtOH vs multiple fractures) but had no loss of balance. He reports he is modified independent with mobility (with the crutch) and doesn't need further PT. He became tearful at end of session and stated he, "just needs someone to talk to". Chaplain consult placed. PT signing off as pt is independent with mobility with his crutch.      Follow Up Recommendations No PT follow up    Equipment Recommendations  None recommended by PT    Recommendations for Other Services       Precautions / Restrictions Precautions Precautions: Fall Precaution Comments: 3 documented falls with fractures since January 2020      Mobility  Bed Mobility Overal bed mobility: Modified Independent             General bed mobility comments: HOB up, used rail  Transfers Overall transfer level: Modified independent Equipment used: Crutches                Ambulation/Gait Ambulation/Gait assistance: Modified independent (Device/Increase time) Gait Distance (Feet): 200 Feet Assistive device: Crutches(single crutch on R) Gait Pattern/deviations: Decreased step length - left;Decreased step length - right;Decreased stride length;Wide base of support Gait velocity: mildly decr   General Gait Details: wide BOS (2* EtOH vs multiple fractures), no loss of balance, SaO2 94% on room air,  0/4 dyspnea  Stairs            Wheelchair Mobility    Modified Fluellen (Stroke Patients Only)       Balance Overall balance assessment: Modified Independent;History of Falls(pt reports no falls since July 2020)                                           Pertinent Vitals/Pain Pain Assessment: 0-10 Pain Score: 6  Pain Location: L upper back Pain Descriptors / Indicators: Sore Pain Intervention(s): Limited activity within patient's tolerance;Monitored during session;RN gave pain meds during session    Home Living Family/patient expects to be discharged to:: Private residence Living Arrangements: Spouse/significant other(lives with his girlfriend)     Home Access: Stairs to enter   Secretary/administrator of Steps: 1 Home Layout: One level Home Equipment: Crutches;Walker - 2 wheels      Prior Function Level of Independence: Independent with assistive device(s)         Comments: walks with single axillary crutch     Hand Dominance        Extremity/Trunk Assessment   Upper Extremity Assessment Upper Extremity Assessment: Overall WFL for tasks assessed    Lower Extremity Assessment Lower Extremity Assessment: Overall WFL for tasks assessed    Cervical / Trunk Assessment Cervical / Trunk Assessment: Normal  Communication   Communication: No difficulties  Cognition Arousal/Alertness: Awake/alert Behavior During Therapy: WFL for tasks assessed/performed(pt became teary at end of session, stated he "  needs someone to talk to", chaplain contacted) Overall Cognitive Status: Within Functional Limits for tasks assessed                                        General Comments      Exercises     Assessment/Plan    PT Assessment Patent does not need any further PT services  PT Problem List         PT Treatment Interventions      PT Goals (Current goals can be found in the Care Plan section)  Acute Rehab PT  Goals Patient Stated Goal: "I just need someone to talk to" PT Goal Formulation: All assessment and education complete, DC therapy    Frequency     Barriers to discharge        Co-evaluation               AM-PAC PT "6 Clicks" Mobility  Outcome Measure Help needed turning from your back to your side while in a flat bed without using bedrails?: None Help needed moving from lying on your back to sitting on the side of a flat bed without using bedrails?: None Help needed moving to and from a bed to a chair (including a wheelchair)?: None Help needed standing up from a chair using your arms (e.g., wheelchair or bedside chair)?: None Help needed to walk in hospital room?: None Help needed climbing 3-5 steps with a railing? : A Little 6 Click Score: 23    End of Session Equipment Utilized During Treatment: Gait belt Activity Tolerance: Patient tolerated treatment well;No increased pain Patient left: in bed;with call bell/phone within reach Nurse Communication: Mobility status;Other (comment)(chaplain consult requested)      Time: 9381-0175 PT Time Calculation (min) (ACUTE ONLY): 17 min   Charges:   PT Evaluation $PT Eval Low Complexity: 1 Low         Philomena Doheny PT 08/03/2019  Acute Rehabilitation Services Pager [phone] Office [phone]

## 2019-08-03 NOTE — Progress Notes (Signed)
PROGRESS NOTE    Richard MemoryCarl D Lopez  BDZ:[id number] DOB: 12/06/1975 DOA: 08/01/2019 PCP: Grayce SessionsEdwards, Michelle P, NP    Brief Narrative:  43 year old gentleman with history of alcohol abuse, recurrent fall with recent multiple fractures, diet-controlled diabetes, hypertension presented to the emergency room with right upper back pain for 2 days.  Also had some cough and low-grade temperature at home. Patient had left tibia-fibula fracture 11/2018 , right hip fracture, 03/2019, periprosthetic right hip fracture, 05/2019 and mostly on crutches since then.  In the emergency room he was found to have bilateral PE and cavitary pneumonia as well.   Assessment & Plan:   Principal Problem:   Cavitary lesion of lung Active Problems:   Type 2 diabetes mellitus with hyperlipidemia (HCC)   Essential hypertension   Marijuana abuse   Metabolic acidosis   Alcohol use  Cavitary lesion of the lung and right lower lobe pneumonia: Not present on CT scans from previous admissions.  Discussed with ID and pulmonary on admission. Suspected cavitary pneumonia. Antibiotics to treat bacterial pneumonia, starting on Unasyn.Chest physiotherapy, incentive spirometry, deep breathing exercises, sputum induction, mucolytic's and bronchodilators. Sputum cultures, blood cultures. Supplemental oxygen to keep saturations more than 90%. Patient will need follow-up CT scan to ensure stabilization.  Acute pulmonary embolism/right lower leg multiple vein DVT: CT scan shows bilateral multifocal pulmonary embolism.  Patient on room air.  No right heart strain.   Patient is on Lovenox.   Discussed different anticoagulation options and patient is agreeable to go on Eliquis.   He will need charity medications.    Recurrent falls: No organic cause found.  Thought to be related to alcoholism.  Currently stable.  No new fractures.  Alcohol abuse: Patient denies everyday use of alcohol.  Currently on benzodiazepine based CIWA protocol.  On  multivitamins.  Monitor.  Hypokalemia: Replaced.  Keep on a schedule replacement.  We will replace and recheck levels in the morning.  Hypomagnesemia: Replaced.  Keep on oral replacement.    Start mobilizing.  DVT prophylaxis: Lovenox  Code Status: Full code Family Communication: None Disposition Plan: Home when stable.  Anticipate tomorrow.   Consultants:   None.  Infectious disease and pulmonary, case discussed and on admission by admitting doctor  Procedures:   None  Antimicrobials:   Augmentin, 07/24/2019---   Subjective: Patient seen and examined.  No overnight events.  Still has pleuritic chest pain and unable to take deep breathing.  Afebrile.  He wants some strong pain medicine through injection.  Objective: Vitals:   08/02/19 0859 08/02/19 1434 08/02/19 2014 08/03/19 0523  BP: (!) 141/80 (!) 141/102 131/69 126/85  Pulse: 97 86 81 72  Resp: 18 18 16 16   Temp: 98.4 F (36.9 C) 97.8 F (36.6 C) 97.7 F (36.5 C) 98.5 F (36.9 C)  TempSrc: Oral Oral Oral Oral  SpO2: 100% 100% 100% 98%  Weight:      Height:        Intake/Output Summary (Last 24 hours) at 08/03/2019 1235 Last data filed at 08/03/2019 1216 Gross per 24 hour  Intake 1657.83 ml  Output 2125 ml  Net -467.17 ml   Filed Weights   08/01/19 0147 08/01/19 1244  Weight: 97.5 kg 99.8 kg    Examination:  General exam: Appears calm and comfortable, on room air Respiratory system: Clear to auscultation. Respiratory effort normal. Cardiovascular system: S1 & S2 heard, RRR. No JVD, murmurs, rubs, gallops or clicks. No pedal edema. Gastrointestinal system: Abdomen is nondistended, soft and nontender.  No organomegaly or masses felt. Normal bowel sounds heard. Central nervous system: Alert and oriented. No focal neurological deficits. Extremities: Symmetric 5 x 5 power.  Deformity right upper area of the knee. Skin: No rashes, lesions or ulcers Psychiatry: Judgement and insight appear normal. Mood &  affect appropriate.     Data Reviewed: I have personally reviewed following labs and imaging studies  CBC: Recent Labs  Lab 08/01/19 0818 08/01/19 2016 08/02/19 0639 08/03/19 0651  WBC 9.6 8.9 6.7 6.3  NEUTROABS 6.8  --   --  3.8  HGB 13.8 11.6* 11.7* 10.5*  HCT 41.9 35.3* 35.7* 32.2*  MCV 83.5 84.0 85.8 85.0  PLT 245 227 201 227   Basic Metabolic Panel: Recent Labs  Lab 08/01/19 0818 08/01/19 2016 08/02/19 0639 08/03/19 0651  NA 128* 128* 131* 131*  K 3.8 3.5 2.9* 3.4*  CL 88* 93* 97* 100  CO2 GLUCOSE 131* 114* 88 127*  BUN 5*  CREATININE 0.77 0.63 0.56* 0.52*  CALCIUM 8.4* 7.6* 7.3* 7.6*  MG  --  1.4*  --  1.6*  PHOS  --  2.6  --   --    GFR: Estimated Creatinine Clearance: 155.5 mL/min (A) (by C-G formula based on SCr of 0.52 mg/dL (L)). Liver Function Tests: Recent Labs  Lab 08/01/19 0818 08/01/19 2016 08/02/19 0639  AST 20 28 14*  ALT ALKPHOS 114 90 76  BILITOT 2.2* 1.2 1.1  PROT 7.9 6.7 5.7*  ALBUMIN 3.5 2.8* 2.5*   Recent Labs  Lab 08/01/19 0818  LIPASE 19   No results for input(s): AMMONIA in the last 168 hours. Coagulation Profile: Recent Labs  Lab 08/01/19 1100  INR 1.2   Cardiac Enzymes: No results for input(s): CKTOTAL, CKMB, CKMBINDEX, TROPONINI in the last 168 hours. BNP (last 3 results) No results for input(s): PROBNP in the last 8760 hours. HbA1C: No results for input(s): HGBA1C in the last 72 hours. CBG: No results for input(s): GLUCAP in the last 168 hours. Lipid Profile: No results for input(s): CHOL, HDL, LDLCALC, TRIG, CHOLHDL, LDLDIRECT in the last 72 hours. Thyroid Function Tests: No results for input(s): TSH, T4TOTAL, FREET4, T3FREE, THYROIDAB in the last 72 hours. Anemia Panel: No results for input(s): VITAMINB12, FOLATE, FERRITIN, TIBC, IRON, RETICCTPCT in the last 72 hours. Sepsis Labs: Recent Labs  Lab 08/01/19 0818  LATICACIDVEN 1.8    Recent Results (from the past 240  hour(s))  Culture, blood (routine x 2)     Status: None (Preliminary result)   Collection Time: 08/01/19  8:18 AM   Specimen: Right Antecubital; Blood  Result Value Ref Range Status   Specimen Description   Final    RIGHT ANTECUBITAL Performed at [facility], [address] [place], [place] [address]    Special Requests   Final    BOTTLES DRAWN AEROBIC AND ANAEROBIC Blood Culture adequate volume Performed at [facility], [address] [place], [place] [address]    Culture   Final    NO GROWTH 2 DAYS Performed at [facility], [address] [place], [place] [address]    Report Status PENDING  Incomplete  SARS Coronavirus 2 [facility] order, Performed in [facility] hospital lab) Nasopharyngeal Nasopharyngeal Swab     Status: None   Collection Time: 08/01/19  8:18 AM   Specimen: Nasopharyngeal Swab  Result Value Ref Range Status   SARS Coronavirus 2  NEGATIVE NEGATIVE Final    Comment: (NOTE) If result is NEGATIVE SARS-CoV-2 target nucleic acids are NOT DETECTED. The SARS-CoV-2 RNA is generally detectable in upper and lower  respiratory specimens during the acute phase of infection. The lowest  concentration of SARS-CoV-2 viral copies this assay can detect is 250  copies / mL. A negative result does not preclude SARS-CoV-2 infection  and should not be used as the sole basis for treatment or other  patient management decisions.  A negative result may occur with  improper specimen collection / handling, submission of specimen other  than nasopharyngeal swab, presence of viral mutation(s) within the  areas targeted by this assay, and inadequate number of viral copies  (<250 copies / mL). A negative result must be combined with clinical  observations, patient history, and epidemiological information. If result is POSITIVE SARS-CoV-2 target nucleic acids are DETECTED. The SARS-CoV-2 RNA is generally detectable in upper and lower    respiratory specimens dur ing the acute phase of infection.  Positive  results are indicative of active infection with SARS-CoV-2.  Clinical  correlation with patient history and other diagnostic information is  necessary to determine patient infection status.  Positive results do  not rule out bacterial infection or co-infection with other viruses. If result is PRESUMPTIVE POSTIVE SARS-CoV-2 nucleic acids MAY BE PRESENT.   A presumptive positive result was obtained on the submitted specimen  and confirmed on repeat testing.  While 2019 novel coronavirus  (SARS-CoV-2) nucleic acids may be present in the submitted sample  additional confirmatory testing may be necessary for epidemiological  and / or clinical management purposes  to differentiate between  SARS-CoV-2 and other Sarbecovirus currently known to infect humans.  If clinically indicated additional testing with an alternate test  methodology [phone]) is advised. The SARS-CoV-2 RNA is generally  detectable in upper and lower respiratory sp ecimens during the acute  phase of infection. The expected result is Negative. Fact Sheet for Patients:  BoilerBrush.com.cy Fact Sheet for Healthcare Providers: https://pope.com/ This test is not yet approved or cleared by the [place] FDA and has been authorized for detection and/or diagnosis of SARS-CoV-2 by FDA under an Emergency Use Authorization (EUA).  This EUA will remain in effect (meaning this test can be used) for the duration of the COVID-19 declaration under Section 564(b)(1) of the Act, 21 U.S.C. section 360bbb-3(b)(1), unless the authorization is terminated or revoked sooner. Performed at [facility], [address], [place], [place] [address]   Urine culture     Status: None   Collection Time: 08/01/19  8:18 AM   Specimen: In/Out Cath Urine  Result Value Ref Range Status   Specimen Description    Final    IN/OUT CATH URINE Performed at [facility], [address], [place], [place] [address]    Special Requests   Final    NONE Performed at [facility], [address] [place], [place] [address]    Culture   Final    NO GROWTH Performed at [facility], [address], [place], [place] [address]    Report Status 08/02/2019 FINAL  Final         Radiology Studies: Vas [place] Lower Extremity Venous (dvt)  Result Date: 08/01/2019  Lower Venous Study Indications: Pain, and Swelling.  Risk Factors: None identified. Anticoagulation: Heparin. Comparison Study: No prior studies. Performing Technologist: Chanda Busing RVT  Examination Guidelines: A complete evaluation includes B-mode imaging, spectral Doppler,  color Doppler, and power Doppler as needed of all accessible portions of each vessel. Bilateral testing is considered an integral part of a complete examination. Limited examinations for reoccurring indications may be performed as noted.  +---------+---------------+---------+-----------+----------+--------------+  RIGHT     Compressibility Phasicity Spontaneity Properties Thrombus Aging  +---------+---------------+---------+-----------+----------+--------------+  CFV       Full            Yes       Yes                                    +---------+---------------+---------+-----------+----------+--------------+  SFJ       Full                                                             +---------+---------------+---------+-----------+----------+--------------+  FV Prox   Full                                                             +---------+---------------+---------+-----------+----------+--------------+  FV Mid    Full                                                             +---------+---------------+---------+-----------+----------+--------------+  FV Distal Full                                                              +---------+---------------+---------+-----------+----------+--------------+  PFV       Full                                                             +---------+---------------+---------+-----------+----------+--------------+  POP       Partial         No        No                     Acute           +---------+---------------+---------+-----------+----------+--------------+  PTV       None                                             Acute           +---------+---------------+---------+-----------+----------+--------------+  PERO      Full                                                             +---------+---------------+---------+-----------+----------+--------------+  Gastroc   Partial                                          Acute           +---------+---------------+---------+-----------+----------+--------------+   +---------+---------------+---------+-----------+----------+--------------+  LEFT      Compressibility Phasicity Spontaneity Properties Thrombus Aging  +---------+---------------+---------+-----------+----------+--------------+  CFV       Full            Yes       Yes                                    +---------+---------------+---------+-----------+----------+--------------+  SFJ       Full                                                             +---------+---------------+---------+-----------+----------+--------------+  FV Prox   Full                                                             +---------+---------------+---------+-----------+----------+--------------+  FV Mid    Full                                                             +---------+---------------+---------+-----------+----------+--------------+  FV Distal Full                                                             +---------+---------------+---------+-----------+----------+--------------+  PFV       Full                                                              +---------+---------------+---------+-----------+----------+--------------+  POP       Full            Yes       Yes                                    +---------+---------------+---------+-----------+----------+--------------+  PTV       Full                                                             +---------+---------------+---------+-----------+----------+--------------+  PERO      Full                                                             +---------+---------------+---------+-----------+----------+--------------+     Summary: Right: Findings consistent with acute deep vein thrombosis involving the right popliteal vein, right posterior tibial veins, and right gastrocnemius veins. No cystic structure found in the popliteal fossa. Left: There is no evidence of deep vein thrombosis in the lower extremity. No cystic structure found in the popliteal fossa.  *See table(s) above for measurements and observations. Electronically signed by Ruta Hinds MD on 08/01/2019 at 4:08:12 PM.    Final         Scheduled Meds:  albuterol  5 mg Nebulization Once   docusate sodium  100 mg Oral BID   enoxaparin (LOVENOX) injection  100 mg Subcutaneous X21J   folic acid  1 mg Oral Daily   guaiFENesin  600 mg Oral BID   LORazepam  0-4 mg Intravenous Q6H   Followed by   LORazepam  0-4 mg Intravenous Q12H   magnesium oxide  400 mg Oral BID   multivitamin with minerals  1 tablet Oral Daily   potassium chloride  40 mEq Oral BID   thiamine  100 mg Oral Daily   Or   thiamine  100 mg Intravenous Daily   Continuous Infusions:  ampicillin-sulbactam (UNASYN) IV 3 g (08/03/19 0951)     LOS: 2 days    Time spent: 25 minutes    Barb Merino, MD [facility] Pager [phone]  If 7PM-7AM, please contact night-coverage [email or web address] Password [facility] 08/03/2019, 12:35 PM

## 2019-08-04 LAB — CYCLIC CITRUL PEPTIDE ANTIBODY, IGG/IGA: CCP Antibodies IgG/IgA: 5 units (ref 0–19)

## 2019-08-04 MED ORDER — ENOXAPARIN SODIUM 100 MG/ML ~~LOC~~ SOLN
1.0000 mg/kg | Freq: Once | SUBCUTANEOUS | Status: AC
  Administered 2019-08-04: 21:00:00 100 mg via SUBCUTANEOUS
  Filled 2019-08-04: qty 1

## 2019-08-04 MED ORDER — AMOXICILLIN-POT CLAVULANATE 875-125 MG PO TABS: 1.0000 | ORAL_TABLET | Freq: Two times a day (BID) | ORAL | 0 refills | Status: AC

## 2019-08-04 MED ORDER — ELIQUIS 5 MG VTE STARTER PACK: ORAL_TABLET | ORAL | 0 refills | Status: DC

## 2019-08-04 MED FILL — !ELIQUIS 5MG TABLET: 5 | 28 days supply | Qty: 35 | Fill #0

## 2019-08-04 MED FILL — AMOX-CLAV 875-125 MG TABLET: 875-125 | 7 days supply | Qty: 14 | Fill #0

## 2019-08-04 NOTE — Progress Notes (Signed)
Assisted pt in scheduling appointment at [facility] for follow up care and establishing primary care. Appt on AVS 08/22/19 9:30am. Pt's DC medication scripts sent to [facility] 30 day coupon card to be provided by [facility] prior to pt [place] today. Pt instructed on applying for Eliquis patient assistance program at pharmacy to investigate future financial assistance for this medication.  Sharren Bridge, MSW, LCSW Transitions of Care 08/04/2019 [phone]

## 2019-08-04 NOTE — Progress Notes (Signed)
OT Cancellation Note  Patient Details Name: Richard Lopez MRN: [id number] DOB: [birth year]   Cancelled Treatment:    Reason Eval/Treat Not Completed: OT screened, no needs identified, will sign off; noted per PT  pt mobilizing at mod independent level with crutch. Pt reports continued mod independence with ADL at this time and feels at his baseline, no acute OT needs. Acute OT to sign off, please re-consult if pt's needs change.  Lou Cal, OT [facility] Pager [phone] Office [phone]   Raymondo Band 08/04/2019, 10:26 AM

## 2019-08-04 NOTE — Progress Notes (Signed)
Patient informed this RN that he had been coughing up blood through out the day. He said he notified his previous RN and other RN's on the floor. This RN paged the MD and the MD wanted to cancel the discharge and continue to monitor the patient. The patient agreed to stay another night. Will continue to monitor.

## 2019-08-04 NOTE — Progress Notes (Signed)
Chaplain provided support at bedside at referral of RN.    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  Spoke with chaplain about dynamics in his relationship with S/O, feelings of isolation.   Provided space for voicing feelings, engaging in narrative review and identifying values, grief around changes. Spoke with Richard Lopez about resources - he names a friend who is a Engineer, water whom he hopes to reach out to.

## 2019-08-04 NOTE — Discharge Summary (Signed)
Physician Discharge Summary  Richard Lopez:[id number] DOB: 1976/09/08 DOA: 08/01/2019  PCP: [name], NP  Admit date: 08/01/2019 Discharge date: 08/04/2019  Admitted From: Home. Disposition: Home.  Recommendations for Outpatient Follow-up:  1. Follow up with PCP in 3 weeks as scheduled.  Home Health: Not applicable Equipment/Devices: None.  Patient has a crutch.  Discharge Condition: Stable CODE STATUS: Full code Diet recommendation: Regular diet.  No alcohol.  Discharge summary:  43 year old gentleman with history of alcohol abuse, recurrent falls with recent multiple fractures presented to the emergency room with right upper back pain for 2 days.  Had some cough with dry sputum. Patient had left tibia-fibula fracture on 11/2018, right hip fracture on 03/2019, periprosthetic right hip fracture on 05/2019 and mostly walking with crutches.  In the emergency room, he was found with bilateral PE and right lower lobe pneumonia on CTA.  Acute pulmonary embolism/right lower leg multiple vein DVT: CAT scan shows bilateral multifocal PE.  On room air.  No right heart strain.  Treated with Lovenox.  Discussed in detail with patient about treatment of acute PE and DVT and risk of using blood thinners.  Patient was extensively educated that he needs to be on blood thinner because untreated embolism could be fatal.  Patient now has good understanding about avoiding trauma, avoiding alcohol and taking all the precautions.  Patient made informed decision to start on Eliquis that was prescribed.  CTA of the chest also showed right lower lobe pneumonia, unknown whether primary infectious process or infarction.  Blood cultures were negative.  Right lower lobe abnormality not present on CT scans from last few months.  Treated with Unasyn.  No pulmonary symptoms.  Will treat with 7 more days of Augmentin. Patient will need a follow-up CT scan of the chest to ensure complete resolution of symptoms and  abnormality. Patient will need re scan to rule out any neoplastic process.  This was explained to the patient and an appointment was made with new primary care physician.  We will also send a referral to pulmonology for follow-up.   Discharge Diagnoses:  Principal Problem:   Cavitary lesion of lung Active Problems:   Type 2 diabetes mellitus with hyperlipidemia (HCC)   Essential hypertension   Marijuana abuse   Metabolic acidosis   Alcohol use    Discharge Instructions  Discharge Instructions    Ambulatory referral to Pulmonology   Complete by: As directed    Abnormal lung lesion   Call MD for:  difficulty breathing, headache or visual disturbances   Complete by: As directed    Diet - low sodium heart healthy   Complete by: As directed    Discharge instructions   Complete by: As directed    He was on blood thinners, familiarize your self with the medications. You may have more bleeding if in your, seek immediate attention.   Increase activity slowly   Complete by: As directed      Allergies as of 08/04/2019   No Known Allergies     Medication List    STOP taking these medications   enoxaparin 40 MG/0.4ML injection Commonly known as: LOVENOX   ibuprofen 200 MG tablet Commonly known as: ADVIL     TAKE these medications   amoxicillin-clavulanate 875-125 MG tablet Commonly known as: Augmentin Take 1 tablet by mouth every 12 (twelve) hours for 7 days.   cholecalciferol 25 MCG (1000 UT) tablet Commonly known as: VITAMIN D3 Take 2 tablets (2,000 Units total)  by mouth 2 (two) times daily.   Eliquis DVT/PE Starter Pack 5 MG Tabs Take as directed on package: start with two-5mg  tablets twice daily for 7 days. On day 8, switch to one-5mg  tablet twice daily.   guaiFENesin 600 MG 12 hr tablet Commonly known as: MUCINEX Take 600 mg by mouth 2 (two) times daily as needed for cough or to loosen phlegm.   multivitamin with minerals Tabs tablet Take 1 tablet by mouth  daily.   thiamine 100 MG tablet Take 1 tablet (100 mg total) by mouth daily.       No Known Allergies  Consultations:  None.     Procedures/Studies: Dg Chest 2 View  Result Date: 08/01/2019 CLINICAL DATA:  Shortness of breath. Right-sided chest pain. EXAM: CHEST - 2 VIEW COMPARISON:  05/16/2019 FINDINGS: Normal heart size and mediastinal contours. Mild patchy opacity in the right lung base and infrahilar lung. Left lung is clear. No pulmonary edema. No definite pleural effusion. No pneumothorax. No acute osseous abnormalities are seen. IMPRESSION: Mild patchy opacity in the right lung base, may reflect atelectasis or pneumonia. Electronically Signed   By: Narda RutherfordMelanie  Sanford M.D.   On: 08/01/2019 02:20   Ct Angio Chest Pe W And/or Wo Contrast  Result Date: 08/01/2019 CLINICAL DATA:  Shortness of breath and chest pain.  Fever. EXAM: CT ANGIOGRAPHY CHEST WITH CONTRAST TECHNIQUE: Multidetector CT imaging of the chest was performed using the standard protocol during bolus administration of intravenous contrast. Multiplanar CT image reconstructions and MIPs were obtained to evaluate the vascular anatomy. CONTRAST:  100mL OMNIPAQUE IOHEXOL 350 MG/ML SOLN COMPARISON:  Chest radiograph August 01, 2019 FINDINGS: Cardiovascular: There are multiple pulmonary emboli involving multiple lower lobe pulmonary artery branches bilaterally. There is a slightly greater degree of pulmonary embolic change in the right lower lobe and left lower lobe. There is an incompletely obstructing pulmonary embolus in the posterior segment right upper lobe pulmonary artery branch. No central pulmonary embolus is evident. There is no right heart strain; the right ventricle to left ventricle diameter ratio is less than 0.9. There is no demonstrable thoracic aortic aneurysm or dissection. The visualized great vessels appear unremarkable. There are multiple foci of coronary artery calcification. There is no appreciable  pericardial effusion or pericardial thickening. Mediastinum/Nodes: Thyroid appears normal. There is a 1.5 x 1.3 cm subcarinal lymph node. There is a right hilar lymph node measuring 1.3 x 1.1 cm. No esophageal lesions are evident. Lungs/Pleura: There is a fairly small right pleural effusion. There is airspace consolidation consistent with pneumonia involving portions of the superior and lateral segments of the right lower lobe. There is a cavitary lesion in the posterior segment of the right lower lobe measuring 3.8 x 2.6 cm. The lungs elsewhere are clear. Upper Abdomen: Visualized upper abdominal structures appear normal. Musculoskeletal: There is degenerative change in the thoracic spine. No blastic or lytic bone lesions are evident. There are no chest wall lesions. Review of the MIP images confirms the above findings. IMPRESSION: 1.  Multiple pulmonary emboli.  No right heart strain. 2. Cavitary lesion in the posterior segment right lower lobe. Question cavitary pneumonia versus cavitary neoplasm. 3. Fairly small right pleural effusion. Airspace consolidation consistent with pneumonia involving portions of the superior and lateral segments of the right lower lobe. 4. Prominent right hilar and subcarinal lymph nodes. Neoplastic etiology for this adenopathy cannot be excluded. 5.  Multiple foci of coronary artery calcification. Critical Value/emergent results were called by telephone at the time of  interpretation on 08/01/2019 at 12:03 pm to providerWYLDER FONDAW , who verbally acknowledged these results. Electronically Signed   By: Bretta Bang III M.D.   On: 08/01/2019 12:05   Vas [place] Lower Extremity Venous (dvt)  Result Date: 08/01/2019  Lower Venous Study Indications: Pain, and Swelling.  Risk Factors: None identified. Anticoagulation: Heparin. Comparison Study: No prior studies. Performing Technologist: Chanda Busing RVT  Examination Guidelines: A complete evaluation includes B-mode imaging, spectral  Doppler, color Doppler, and power Doppler as needed of all accessible portions of each vessel. Bilateral testing is considered an integral part of a complete examination. Limited examinations for reoccurring indications may be performed as noted.  +---------+---------------+---------+-----------+----------+--------------+ RIGHT    CompressibilityPhasicitySpontaneityPropertiesThrombus Aging +---------+---------------+---------+-----------+----------+--------------+ CFV      Full           Yes      Yes                                 +---------+---------------+---------+-----------+----------+--------------+ SFJ      Full                                                        +---------+---------------+---------+-----------+----------+--------------+ FV Prox  Full                                                        +---------+---------------+---------+-----------+----------+--------------+ FV Mid   Full                                                        +---------+---------------+---------+-----------+----------+--------------+ FV DistalFull                                                        +---------+---------------+---------+-----------+----------+--------------+ PFV      Full                                                        +---------+---------------+---------+-----------+----------+--------------+ POP      Partial        No       No                   Acute          +---------+---------------+---------+-----------+----------+--------------+ PTV      None                                         Acute          +---------+---------------+---------+-----------+----------+--------------+ PERO     Full                                                        +---------+---------------+---------+-----------+----------+--------------+  Gastroc  Partial                                      Acute           +---------+---------------+---------+-----------+----------+--------------+   +---------+---------------+---------+-----------+----------+--------------+ LEFT     CompressibilityPhasicitySpontaneityPropertiesThrombus Aging +---------+---------------+---------+-----------+----------+--------------+ CFV      Full           Yes      Yes                                 +---------+---------------+---------+-----------+----------+--------------+ SFJ      Full                                                        +---------+---------------+---------+-----------+----------+--------------+ FV Prox  Full                                                        +---------+---------------+---------+-----------+----------+--------------+ FV Mid   Full                                                        +---------+---------------+---------+-----------+----------+--------------+ FV DistalFull                                                        +---------+---------------+---------+-----------+----------+--------------+ PFV      Full                                                        +---------+---------------+---------+-----------+----------+--------------+ POP      Full           Yes      Yes                                 +---------+---------------+---------+-----------+----------+--------------+ PTV      Full                                                        +---------+---------------+---------+-----------+----------+--------------+ PERO     Full                                                        +---------+---------------+---------+-----------+----------+--------------+  Summary: Right: Findings consistent with acute deep vein thrombosis involving the right popliteal vein, right posterior tibial veins, and right gastrocnemius veins. No cystic structure found in the popliteal fossa. Left: There is no evidence of deep vein thrombosis in  the lower extremity. No cystic structure found in the popliteal fossa.  *See table(s) above for measurements and observations. Electronically signed by Fabienne Bruns MD on 08/01/2019 at 4:08:12 PM.    Final     Subjective: Patient seen and examined.  No overnight events.  He still has some back pain and he will use Tylenol for that.  No cough or sputum.  Afebrile.  Able to walk with crutches without difficulty.   Discharge Exam: Vitals:   08/04/19 0505 08/04/19 1329  BP: 113/66 133/77  Pulse: 76 83  Resp: 19 (!) 22  Temp: 98 F (36.7 C) 98 F (36.7 C)  SpO2: 100% 100%   Vitals:   08/03/19 1559 08/03/19 2016 08/04/19 0505 08/04/19 1329  BP: 124/81 (!) 141/82 113/66 133/77  Pulse: 85 83 76 83  Resp: 20 19 19  (!) 22  Temp: 98 F (36.7 C) 98.2 F (36.8 C) 98 F (36.7 C) 98 F (36.7 C)  TempSrc:  Oral Oral Oral  SpO2: 100% 100% 100% 100%  Weight:      Height:        General: Pt is alert, awake, not in acute distress, on room air. Cardiovascular: RRR, S1/S2 +, no rubs, no gallops Respiratory: CTA bilaterally, no wheezing, no rhonchi Abdominal: Soft, NT, ND, bowel sounds + Extremities: no edema, no cyanosis, has some deformity of the right femur.  Distal neurovascular status intact.    The results of significant diagnostics from this hospitalization (including imaging, microbiology, ancillary and laboratory) are listed below for reference.     Microbiology: Recent Results (from the past 240 hour(s))  Culture, blood (routine x 2)     Status: None (Preliminary result)   Collection Time: 08/01/19  8:18 AM   Specimen: Right Antecubital; Blood  Result Value Ref Range Status   Specimen Description   Final    RIGHT ANTECUBITAL Performed at [facility], [address], [place], [place]    Special Requests   Final    BOTTLES DRAWN AEROBIC AND ANAEROBIC Blood Culture adequate volume Performed at [facility], [address] [place], [place]    Culture   Final    NO GROWTH 3 DAYS Performed at [facility], [address], [place], [place]    Report Status PENDING  Incomplete  SARS Coronavirus 2 [facility] order, Performed in [facility] hospital lab) Nasopharyngeal Nasopharyngeal Swab     Status: None   Collection Time: 08/01/19  8:18 AM   Specimen: Nasopharyngeal Swab  Result Value Ref Range Status   SARS Coronavirus 2 NEGATIVE NEGATIVE Final    Comment: (NOTE) If result is NEGATIVE SARS-CoV-2 target nucleic acids are NOT DETECTED. The SARS-CoV-2 RNA is generally detectable in upper and lower  respiratory specimens during the acute phase of infection. The lowest  concentration of SARS-CoV-2 viral copies this assay can detect is 250  copies / mL. A negative result does not preclude SARS-CoV-2 infection  and should not be used as the sole basis for treatment or other  patient management decisions.  A negative result may occur with  improper specimen collection / handling, submission of specimen other  than nasopharyngeal swab, presence of viral mutation(s) within the  areas targeted by this assay, and inadequate number of viral copies  (<250 copies / mL). A negative result must be combined with clinical  observations, patient history, and epidemiological information. If result is POSITIVE SARS-CoV-2 target nucleic acids are DETECTED. The SARS-CoV-2 RNA is generally detectable in upper and lower  respiratory specimens dur ing the acute phase of infection.  Positive  results are indicative of active infection with SARS-CoV-2.  Clinical  correlation with patient history and other diagnostic information is  necessary to determine patient infection status.  Positive results do  not rule out bacterial infection or co-infection with other viruses. If result is PRESUMPTIVE POSTIVE SARS-CoV-2 nucleic acids MAY BE PRESENT.   A presumptive positive result was obtained on the submitted  specimen  and confirmed on repeat testing.  While 2019 novel coronavirus  (SARS-CoV-2) nucleic acids may be present in the submitted sample  additional confirmatory testing may be necessary for epidemiological  and / or clinical management purposes  to differentiate between  SARS-CoV-2 and other Sarbecovirus currently known to infect humans.  If clinically indicated additional testing with an alternate test  methodology [phone]) is advised. The SARS-CoV-2 RNA is generally  detectable in upper and lower respiratory sp ecimens during the acute  phase of infection. The expected result is Negative. Fact Sheet for Patients:  BoilerBrush.com.cy Fact Sheet for Healthcare Providers: https://pope.com/ This test is not yet approved or cleared by the [place] FDA and has been authorized for detection and/or diagnosis of SARS-CoV-2 by FDA under an Emergency Use Authorization (EUA).  This EUA will remain in effect (meaning this test can be used) for the duration of the COVID-19 declaration under Section 564(b)(1) of the Act, 21 U.S.C. section 360bbb-3(b)(1), unless the authorization is terminated or revoked sooner. Performed at [facility], [address] [place], [place] [address]   Urine culture     Status: None   Collection Time: 08/01/19  8:18 AM   Specimen: In/Out Cath Urine  Result Value Ref Range Status   Specimen Description   Final    IN/OUT CATH URINE Performed at [facility], [address] [place], [place] [address]    Special Requests   Final    NONE Performed at [facility], [address], [place], [place] [address]    Culture   Final    NO GROWTH Performed at [facility], [address], [place], [place] [address]    Report Status 08/02/2019 FINAL  Final     Labs: BNP (last 3 results) No results for input(s): BNP in the last 8760 hours. Basic  Metabolic Panel: Recent Labs  Lab 08/01/19 0818 08/01/19 2016 08/02/19 0639 08/03/19 0651  NA 128* 128* 131* 131*  K 3.8 3.5 2.9* 3.4*  CL 88* 93* 97* 100  CO2 GLUCOSE 131* 114* 88 127*  BUN 5*  CREATININE 0.77 0.63 0.56* 0.52*  CALCIUM 8.4* 7.6* 7.3* 7.6*  MG  --  1.4*  --  1.6*  PHOS  --  2.6  --   --    Liver Function Tests: Recent Labs  Lab 08/01/19 0818 08/01/19 2016 08/02/19 0639  AST 20 28 14*  ALT ALKPHOS 114 90 76  BILITOT 2.2* 1.2 1.1  PROT 7.9 6.7 5.7*  ALBUMIN 3.5 2.8* 2.5*   Recent Labs  Lab 08/01/19 0818  LIPASE 19   No results for input(s): AMMONIA  in the last 168 hours. CBC: Recent Labs  Lab 08/01/19 0818 08/01/19 2016 08/02/19 0639 08/03/19 0651  WBC 9.6 8.9 6.7 6.3  NEUTROABS 6.8  --   --  3.8  HGB 13.8 11.6* 11.7* 10.5*  HCT 41.9 35.3* 35.7* 32.2*  MCV 83.5 84.0 85.8 85.0  PLT 245 227 201 227   Cardiac Enzymes: No results for input(s): CKTOTAL, CKMB, CKMBINDEX, TROPONINI in the last 168 hours. BNP: Invalid input(s): POCBNP CBG: No results for input(s): GLUCAP in the last 168 hours. D-Dimer No results for input(s): DDIMER in the last 72 hours. Hgb A1c No results for input(s): HGBA1C in the last 72 hours. Lipid Profile No results for input(s): CHOL, HDL, LDLCALC, TRIG, CHOLHDL, LDLDIRECT in the last 72 hours. Thyroid function studies No results for input(s): TSH, T4TOTAL, T3FREE, THYROIDAB in the last 72 hours.  Invalid input(s): FREET3 Anemia work up No results for input(s): VITAMINB12, FOLATE, FERRITIN, TIBC, IRON, RETICCTPCT in the last 72 hours. Urinalysis    Component Value Date/Time   COLORURINE STRAW (A) 08/01/2019 0818   APPEARANCEUR CLEAR 08/01/2019 0818   LABSPEC 1.003 (L) 08/01/2019 0818   PHURINE 6.0 08/01/2019 0818   GLUCOSEU NEGATIVE 08/01/2019 0818   HGBUR MODERATE (A) 08/01/2019 0818   BILIRUBINUR NEGATIVE 08/01/2019 0818   KETONESUR NEGATIVE 08/01/2019 0818   PROTEINUR  NEGATIVE 08/01/2019 0818   UROBILINOGEN 1.0 07/29/2014 1837   NITRITE NEGATIVE 08/01/2019 0818   LEUKOCYTESUR NEGATIVE 08/01/2019 0818   Sepsis Labs Invalid input(s): PROCALCITONIN,  WBC,  LACTICIDVEN Microbiology Recent Results (from the past 240 hour(s))  Culture, blood (routine x 2)     Status: None (Preliminary result)   Collection Time: 08/01/19  8:18 AM   Specimen: Right Antecubital; Blood  Result Value Ref Range Status   Specimen Description   Final    RIGHT ANTECUBITAL Performed at [facility], [address] [place], [place] [address]    Special Requests   Final    BOTTLES DRAWN AEROBIC AND ANAEROBIC Blood Culture adequate volume Performed at [facility], [address] [place], [place] [address]    Culture   Final    NO GROWTH 3 DAYS Performed at [facility], [address] [place], [place] [address]    Report Status PENDING  Incomplete  SARS Coronavirus 2 [facility](Hospital order, Performed in [facility] lab) Nasopharyngeal Nasopharyngeal Swab     Status: None   Collection Time: 08/01/19  8:18 AM   Specimen: Nasopharyngeal Swab  Result Value Ref Range Status   SARS Coronavirus 2 NEGATIVE NEGATIVE Final    Comment: (NOTE) If result is NEGATIVE SARS-CoV-2 target nucleic acids are NOT DETECTED. The SARS-CoV-2 RNA is generally detectable in upper and lower  respiratory specimens during the acute phase of infection. The lowest  concentration of SARS-CoV-2 viral copies this assay can detect is 250  copies / mL. A negative result does not preclude SARS-CoV-2 infection  and should not be used as the sole basis for treatment or other  patient management decisions.  A negative result may occur with  improper specimen collection / handling, submission of specimen other  than nasopharyngeal swab, presence of viral mutation(s) within the  areas targeted by this assay, and inadequate number of viral copies  (<250 copies / mL).  A negative result must be combined with clinical  observations, patient history, and epidemiological information. If result is POSITIVE SARS-CoV-2 target nucleic acids are DETECTED. The SARS-CoV-2 RNA is generally detectable  in upper and lower  respiratory specimens dur ing the acute phase of infection.  Positive  results are indicative of active infection with SARS-CoV-2.  Clinical  correlation with patient history and other diagnostic information is  necessary to determine patient infection status.  Positive results do  not rule out bacterial infection or co-infection with other viruses. If result is PRESUMPTIVE POSTIVE SARS-CoV-2 nucleic acids MAY BE PRESENT.   A presumptive positive result was obtained on the submitted specimen  and confirmed on repeat testing.  While 2019 novel coronavirus  (SARS-CoV-2) nucleic acids may be present in the submitted sample  additional confirmatory testing may be necessary for epidemiological  and / or clinical management purposes  to differentiate between  SARS-CoV-2 and other Sarbecovirus currently known to infect humans.  If clinically indicated additional testing with an alternate test  methodology [phone]) is advised. The SARS-CoV-2 RNA is generally  detectable in upper and lower respiratory sp ecimens during the acute  phase of infection. The expected result is Negative. Fact Sheet for Patients:  StrictlyIdeas.no Fact Sheet for Healthcare Providers: BankingDealers.co.za This test is not yet approved or cleared by the [place] FDA and has been authorized for detection and/or diagnosis of SARS-CoV-2 by FDA under an Emergency Use Authorization (EUA).  This EUA will remain in effect (meaning this test can be used) for the duration of the COVID-19 declaration under Section 564(b)(1) of the Act, 21 U.S.C. section 360bbb-3(b)(1), unless the authorization is terminated or revoked  sooner. Performed at [facility], [place] [address] [place], [place] [address]   Urine culture     Status: None   Collection Time: 08/01/19  8:18 AM   Specimen: In/Out Cath Urine  Result Value Ref Range Status   Specimen Description   Final    IN/OUT CATH URINE Performed at [facility] [address] [place], [place] [address]    Special Requests   Final    NONE Performed at [facility], [place] [address], [place], [place] [address]    Culture   Final    NO GROWTH Performed at [facility], [place] [address], [place], [place] [address]    Report Status 08/02/2019 FINAL  Final     Time coordinating discharge:  40 minutes  SIGNED:   Barb Merino, MD  [facility] 08/04/2019, 3:17 PM

## 2019-08-04 NOTE — Progress Notes (Signed)
PROGRESS NOTE    Richard Lopez  EXB:[id number] DOB: 05/15/1976 DOA: 08/01/2019 PCP: Grayce SessionsEdwards, Michelle P, NP    Brief Narrative:  43 year old gentleman with history of alcohol abuse, recurrent fall with recent multiple fractures, diet-controlled diabetes, hypertension presented to the emergency room with right upper back pain for 2 days.  Also had some cough and low-grade temperature at home. Patient had left tibia-fibula fracture 11/2018 , right hip fracture, 03/2019, periprosthetic right hip fracture, 05/2019 and mostly on crutches since then.  In the emergency room he was found to have bilateral PE and cavitary pneumonia as well.  Subjective: readying for discharge , started having trace hemoptysis. Back pain remains   Assessment & Plan:   Principal Problem:   Cavitary lesion of lung Active Problems:   Type 2 diabetes mellitus with hyperlipidemia (HCC)   Essential hypertension   Marijuana abuse   Metabolic acidosis   Alcohol use  Right lower lobe pneumonia, Cavitary lesion  Not present on CT scans from previous admissions.  Discussed with ID and pulmonary on admission. Suspected cavitary pneumonia. Antibiotics to treat bacterial pneumonia, on Unasyn.Chest physiotherapy, incentive spirometry, deep breathing exercises, sputum induction, mucolytic's and bronchodilators. Sputum cultures, blood cultures. Supplemental oxygen to keep saturations more than 90%. Patient will need follow-up CT scan to ensure stabilization.  Acute pulmonary embolism/right lower leg multiple vein DVT: CT scan shows bilateral multifocal pulmonary embolism.  Patient on room air.  No right heart strain.   Patient is on Lovenox.   Started on eliquis, having trace hemoptysis, hold and resume lovenox.  Expect conservative management.    Recurrent falls: No organic cause found.  Thought to be related to alcoholism.  Currently stable.  No new fractures.  Alcohol abuse: Patient denies everyday use of alcohol.   Currently on benzodiazepine based CIWA protocol.  On multivitamins.  Monitor.  Hypokalemia: Replaced.  Keep on a schedule replacement.  We will replace and recheck levels in the morning.  Hypomagnesemia: Replaced.  Keep on oral replacement.    Start mobilizing.  DVT prophylaxis: Lovenox  Code Status: Full code Family Communication: None Disposition Plan: Home when stable.  Anticipate tomorrow.   Consultants:   None.  Infectious disease and pulmonary, case discussed and on admission by admitting doctor  Procedures:   None  Antimicrobials:   Augmentin, 07/24/2019---    Objective: Vitals:   08/03/19 1559 08/03/19 2016 08/04/19 0505 08/04/19 1329  BP: 124/81 (!) 141/82 113/66 133/77  Pulse: 85 83 76 83  Resp: 20 19 19  (!) 22  Temp: 98 F (36.7 C) 98.2 F (36.8 C) 98 F (36.7 C) 98 F (36.7 C)  TempSrc:  Oral Oral Oral  SpO2: 100% 100% 100% 100%  Weight:      Height:        Intake/Output Summary (Last 24 hours) at 08/04/2019 1759 Last data filed at 08/04/2019 1614 Gross per 24 hour  Intake 2692.17 ml  Output 2075 ml  Net 617.17 ml   Filed Weights   08/01/19 0147 08/01/19 1244  Weight: 97.5 kg 99.8 kg    Examination:  General exam: Appears calm and comfortable, on room air Respiratory system: Clear to auscultation. Respiratory effort normal. Cardiovascular system: S1 & S2 heard, RRR. No JVD, murmurs, rubs, gallops or clicks. No pedal edema. Gastrointestinal system: Abdomen is nondistended, soft and nontender. No organomegaly or masses felt. Normal bowel sounds heard. Central nervous system: Alert and oriented. No focal neurological deficits. Extremities: Symmetric 5 x 5 power.  Deformity right  upper area of the knee. Skin: No rashes, lesions or ulcers Psychiatry: Judgement and insight appear normal. Mood & affect appropriate.     Data Reviewed: I have personally reviewed following labs and imaging studies  CBC: Recent Labs  Lab 08/01/19 0818 08/01/19  2016 08/02/19 0639 08/03/19 0651  WBC 9.6 8.9 6.7 6.3  NEUTROABS 6.8  --   --  3.8  HGB 13.8 11.6* 11.7* 10.5*  HCT 41.9 35.3* 35.7* 32.2*  MCV 83.5 84.0 85.8 85.0  PLT 245 227 201 227   Basic Metabolic Panel: Recent Labs  Lab 08/01/19 0818 08/01/19 2016 08/02/19 0639 08/03/19 0651  NA 128* 128* 131* 131*  K 3.8 3.5 2.9* 3.4*  CL 88* 93* 97* 100  CO2 24 23 22 24   GLUCOSE 131* 114* 88 127*  BUN 13 10 7  5*  CREATININE 0.77 0.63 0.56* 0.52*  CALCIUM 8.4* 7.6* 7.3* 7.6*  MG  --  1.4*  --  1.6*  PHOS  --  2.6  --   --    GFR: Estimated Creatinine Clearance: 155.5 mL/min (A) (by C-G formula based on SCr of 0.52 mg/dL (L)). Liver Function Tests: Recent Labs  Lab 08/01/19 0818 08/01/19 2016 08/02/19 0639  AST 20 28 14*  ALT 9 11 7   ALKPHOS 114 90 76  BILITOT 2.2* 1.2 1.1  PROT 7.9 6.7 5.7*  ALBUMIN 3.5 2.8* 2.5*   Recent Labs  Lab 08/01/19 0818  LIPASE 19   No results for input(s): AMMONIA in the last 168 hours. Coagulation Profile: Recent Labs  Lab 08/01/19 1100  INR 1.2   Cardiac Enzymes: No results for input(s): CKTOTAL, CKMB, CKMBINDEX, TROPONINI in the last 168 hours. BNP (last 3 results) No results for input(s): PROBNP in the last 8760 hours. HbA1C: No results for input(s): HGBA1C in the last 72 hours. CBG: No results for input(s): GLUCAP in the last 168 hours. Lipid Profile: No results for input(s): CHOL, HDL, LDLCALC, TRIG, CHOLHDL, LDLDIRECT in the last 72 hours. Thyroid Function Tests: No results for input(s): TSH, T4TOTAL, FREET4, T3FREE, THYROIDAB in the last 72 hours. Anemia Panel: No results for input(s): VITAMINB12, FOLATE, FERRITIN, TIBC, IRON, RETICCTPCT in the last 72 hours. Sepsis Labs: Recent Labs  Lab 08/01/19 0818  LATICACIDVEN 1.8    Recent Results (from the past 240 hour(s))  Culture, blood (routine x 2)     Status: None (Preliminary result)   Collection Time: 08/01/19  8:18 AM   Specimen: Right Antecubital; Blood   Result Value Ref Range Status   Specimen Description   Final    RIGHT ANTECUBITAL Performed at [facility], [address] [place], [place]    Special Requests   Final    BOTTLES DRAWN AEROBIC AND ANAEROBIC Blood Culture adequate volume Performed at [facility], [address], [place], [place]    Culture   Final    NO GROWTH 3 DAYS Performed at [facility], [address], [place], [facility] [address]    Report Status PENDING  Incomplete  SARS Coronavirus 2 [facility] order, Performed in [facility] hospital lab) Nasopharyngeal Nasopharyngeal Swab     Status: None   Collection Time: 08/01/19  8:18 AM   Specimen: Nasopharyngeal Swab  Result Value Ref Range Status   SARS Coronavirus 2 NEGATIVE NEGATIVE Final    Comment: (NOTE) If result is NEGATIVE SARS-CoV-2 target nucleic acids are NOT DETECTED. The SARS-CoV-2 RNA is generally detectable in upper and  lower  respiratory specimens during the acute phase of infection. The lowest  concentration of SARS-CoV-2 viral copies this assay can detect is 250  copies / mL. A negative result does not preclude SARS-CoV-2 infection  and should not be used as the sole basis for treatment or other  patient management decisions.  A negative result may occur with  improper specimen collection / handling, submission of specimen other  than nasopharyngeal swab, presence of viral mutation(s) within the  areas targeted by this assay, and inadequate number of viral copies  (<250 copies / mL). A negative result must be combined with clinical  observations, patient history, and epidemiological information. If result is POSITIVE SARS-CoV-2 target nucleic acids are DETECTED. The SARS-CoV-2 RNA is generally detectable in upper and lower  respiratory specimens dur ing the acute phase of infection.  Positive  results are indicative of active infection with SARS-CoV-2.  Clinical  correlation  with patient history and other diagnostic information is  necessary to determine patient infection status.  Positive results do  not rule out bacterial infection or co-infection with other viruses. If result is PRESUMPTIVE POSTIVE SARS-CoV-2 nucleic acids MAY BE PRESENT.   A presumptive positive result was obtained on the submitted specimen  and confirmed on repeat testing.  While 2019 novel coronavirus  (SARS-CoV-2) nucleic acids may be present in the submitted sample  additional confirmatory testing may be necessary for epidemiological  and / or clinical management purposes  to differentiate between  SARS-CoV-2 and other Sarbecovirus currently known to infect humans.  If clinically indicated additional testing with an alternate test  methodology [phone]) is advised. The SARS-CoV-2 RNA is generally  detectable in upper and lower respiratory sp ecimens during the acute  phase of infection. The expected result is Negative. Fact Sheet for Patients:  BoilerBrush.com.cy Fact Sheet for Healthcare Providers: https://pope.com/ This test is not yet approved or cleared by the [place] FDA and has been authorized for detection and/or diagnosis of SARS-CoV-2 by FDA under an Emergency Use Authorization (EUA).  This EUA will remain in effect (meaning this test can be used) for the duration of the COVID-19 declaration under Section 564(b)(1) of the Act, 21 U.S.C. section 360bbb-3(b)(1), unless the authorization is terminated or revoked sooner. Performed at [facility], [address] [place], [place] [address]   Urine culture     Status: None   Collection Time: 08/01/19  8:18 AM   Specimen: In/Out Cath Urine  Result Value Ref Range Status   Specimen Description   Final    IN/OUT CATH URINE Performed at [facility], [address], [place], [place] [address]    Special Requests   Final    NONE  Performed at [facility], [address], [place], [place] [address]    Culture   Final    NO GROWTH Performed at [facility], [address], [place], [place] [address]    Report Status 08/02/2019 FINAL  Final         Radiology Studies: No results found.      Scheduled Meds: . albuterol  5 mg Nebulization Once  . docusate sodium  100 mg Oral BID  . enoxaparin (LOVENOX) injection  1 mg/kg Subcutaneous Once  . folic acid  1 mg Oral Daily  . guaiFENesin  600 mg Oral BID  . LORazepam  0-4 mg Intravenous Q12H  . magnesium oxide  400 mg Oral BID  . multivitamin with minerals  1 tablet Oral Daily  . thiamine  100 mg Oral Daily   Or  . thiamine  100 mg Intravenous Daily   Continuous Infusions: . ampicillin-sulbactam (UNASYN) IV Stopped (08/04/19 1031)     LOS: 3 days    Time spent: 25 minutes    Barb Merino, MD [facility] Pager [phone]  If 7PM-7AM, please contact night-coverage [email or web address] Password [email or web address] 08/04/2019, 5:59 PM

## 2019-08-05 MED ORDER — ENOXAPARIN SODIUM 100 MG/ML ~~LOC~~ SOLN
100.0000 mg | Freq: Once | SUBCUTANEOUS | Status: AC
  Administered 2019-08-05: 11:00:00 100 mg via SUBCUTANEOUS
  Filled 2019-08-05: qty 1

## 2019-08-05 NOTE — Discharge Summary (Signed)
Physician Discharge Summary  Richard Lopez ZHY:[id number] DOB: 01/22/1976 DOA: 08/01/2019  PCP: Grayce SessionsEdwards, Michelle P, NP  Admit date: 08/01/2019 Discharge date: 08/05/2019  Admitted From: Home. Disposition: Home.  Recommendations for Outpatient Follow-up:  1. Follow up with PCP in 3 weeks as scheduled.  Home Health: Not applicable Equipment/Devices: None.  Patient has a crutch.  Discharge Condition: Stable CODE STATUS: Full code Diet recommendation: Regular diet.  No alcohol.  Discharge summary:  43 year old gentleman with history of alcohol abuse, recurrent falls with recent multiple fractures presented to the emergency room with right upper back pain for 2 days.  Had some cough with dry sputum.Patient had left tibia-fibula fracture on 11/2018, right hip fracture on 03/2019, periprosthetic right hip fracture on 05/2019 and mostly walking with crutches.  In the emergency room, he was found with bilateral PE and right lower lobe pneumonia on CTA.  Acute pulmonary embolism/right lower leg multiple vein DVT: CAT scan showed bilateral multifocal PE.  On room air.  No right heart strain.  Treated with Lovenox.  Discussed in detail with patient about treatment of acute PE and DVT and risk of using blood thinners.  Patient was extensively educated that he needs to be on blood thinner because untreated embolism could be fatal.  Patient now has good understanding about avoiding trauma, avoiding alcohol and taking all the precautions.  Patient made informed decision to start on Eliquis that was prescribed.  CTA of the chest also showed right lower lobe pneumonia, unknown whether primary infectious process or infarction.  Blood cultures were negative.  Right lower lobe abnormality not present on CT scans from last few months.  Treated with Unasyn.  No pulmonary symptoms.  Will treat with 7 more days of Augmentin. Patient will need a follow-up CT scan of the chest to ensure complete resolution of symptoms and  abnormality. Patient will need re scan to rule out any neoplastic process.  This was explained to the patient and an appointment was made with new primary care physician.  We will also send a referral to pulmonology for follow-up. Patient had one episode of trace hemoptysis, no recurrence.  This will be anticipated with pulmonary infarction and presence of pneumonia.  Patient was educated to monitor for symptoms and report back if he sees any fresh blood in his sputum.  Patient understands.   Discharge Diagnoses:  Principal Problem:   Cavitary lesion of lung Active Problems:   Type 2 diabetes mellitus with hyperlipidemia (HCC)   Essential hypertension   Marijuana abuse   Metabolic acidosis   Alcohol use    Discharge Instructions  Discharge Instructions    Ambulatory referral to Pulmonology   Complete by: As directed    Abnormal lung lesion   Call MD for:  difficulty breathing, headache or visual disturbances   Complete by: As directed    Diet - low sodium heart healthy   Complete by: As directed    Discharge instructions   Complete by: As directed    He was on blood thinners, familiarize your self with the medications. You may have more bleeding if in your, seek immediate attention.   Increase activity slowly   Complete by: As directed      Allergies as of 08/05/2019   No Known Allergies     Medication List    STOP taking these medications   enoxaparin 40 MG/0.4ML injection Commonly known as: LOVENOX   ibuprofen 200 MG tablet Commonly known as: ADVIL     TAKE  these medications   amoxicillin-clavulanate 875-125 MG tablet Commonly known as: Augmentin Take 1 tablet by mouth every 12 (twelve) hours for 7 days.   cholecalciferol 25 MCG (1000 UT) tablet Commonly known as: VITAMIN D3 Take 2 tablets (2,000 Units total) by mouth 2 (two) times daily.   Eliquis DVT/PE Starter Pack 5 MG Tabs Take as directed on package: start with two-5mg  tablets twice daily for 7 days.  On day 8, switch to one-5mg  tablet twice daily. Notes to patient: Second dose due today   guaiFENesin 600 MG 12 hr tablet Commonly known as: MUCINEX Take 600 mg by mouth 2 (two) times daily as needed for cough or to loosen phlegm.   multivitamin with minerals Tabs tablet Take 1 tablet by mouth daily.   thiamine 100 MG tablet Take 1 tablet (100 mg total) by mouth daily.       No Known Allergies  Consultations:  None.     Procedures/Studies: Dg Chest 2 View  Result Date: 08/01/2019 CLINICAL DATA:  Shortness of breath. Right-sided chest pain. EXAM: CHEST - 2 VIEW COMPARISON:  05/16/2019 FINDINGS: Normal heart size and mediastinal contours. Mild patchy opacity in the right lung base and infrahilar lung. Left lung is clear. No pulmonary edema. No definite pleural effusion. No pneumothorax. No acute osseous abnormalities are seen. IMPRESSION: Mild patchy opacity in the right lung base, may reflect atelectasis or pneumonia. Electronically Signed   By: Narda Rutherford M.D.   On: 08/01/2019 02:20   Ct Angio Chest Pe W And/or Wo Contrast  Result Date: 08/01/2019 CLINICAL DATA:  Shortness of breath and chest pain.  Fever. EXAM: CT ANGIOGRAPHY CHEST WITH CONTRAST TECHNIQUE: Multidetector CT imaging of the chest was performed using the standard protocol during bolus administration of intravenous contrast. Multiplanar CT image reconstructions and MIPs were obtained to evaluate the vascular anatomy. CONTRAST:  OMNIPAQUE IOHEXOL 350 MG/ML SOLN COMPARISON:  Chest radiograph August 01, 2019 FINDINGS: Cardiovascular: There are multiple pulmonary emboli involving multiple lower lobe pulmonary artery branches bilaterally. There is a slightly greater degree of pulmonary embolic change in the right lower lobe and left lower lobe. There is an incompletely obstructing pulmonary embolus in the posterior segment right upper lobe pulmonary artery branch. No central pulmonary embolus is evident. There  is no right heart strain; the right ventricle to left ventricle diameter ratio is less than 0.9. There is no demonstrable thoracic aortic aneurysm or dissection. The visualized great vessels appear unremarkable. There are multiple foci of coronary artery calcification. There is no appreciable pericardial effusion or pericardial thickening. Mediastinum/Nodes: Thyroid appears normal. There is a 1.5 x 1.3 cm subcarinal lymph node. There is a right hilar lymph node measuring 1.3 x 1.1 cm. No esophageal lesions are evident. Lungs/Pleura: There is a fairly small right pleural effusion. There is airspace consolidation consistent with pneumonia involving portions of the superior and lateral segments of the right lower lobe. There is a cavitary lesion in the posterior segment of the right lower lobe measuring 3.8 x 2.6 cm. The lungs elsewhere are clear. Upper Abdomen: Visualized upper abdominal structures appear normal. Musculoskeletal: There is degenerative change in the thoracic spine. No blastic or lytic bone lesions are evident. There are no chest wall lesions. Review of the MIP images confirms the above findings. IMPRESSION: 1.  Multiple pulmonary emboli.  No right heart strain. 2. Cavitary lesion in the posterior segment right lower lobe. Question cavitary pneumonia versus cavitary neoplasm. 3. Fairly small right pleural effusion. Airspace consolidation consistent  with pneumonia involving portions of the superior and lateral segments of the right lower lobe. 4. Prominent right hilar and subcarinal lymph nodes. Neoplastic etiology for this adenopathy cannot be excluded. 5.  Multiple foci of coronary artery calcification. Critical Value/emergent results were called by telephone at the time of interpretation on 08/01/2019 at 12:03 pm to providerWYLDER FONDAW , who verbally acknowledged these results. Electronically Signed   By: Bretta Bang III M.D.   On: 08/01/2019 12:05   Vas [place] Lower Extremity Venous  (dvt)  Result Date: 08/01/2019  Lower Venous Study Indications: Pain, and Swelling.  Risk Factors: None identified. Anticoagulation: Heparin. Comparison Study: No prior studies. Performing Technologist: Chanda Busing RVT  Examination Guidelines: A complete evaluation includes B-mode imaging, spectral Doppler, color Doppler, and power Doppler as needed of all accessible portions of each vessel. Bilateral testing is considered an integral part of a complete examination. Limited examinations for reoccurring indications may be performed as noted.  +---------+---------------+---------+-----------+----------+--------------+ RIGHT    CompressibilityPhasicitySpontaneityPropertiesThrombus Aging +---------+---------------+---------+-----------+----------+--------------+ CFV      Full           Yes      Yes                                 +---------+---------------+---------+-----------+----------+--------------+ SFJ      Full                                                        +---------+---------------+---------+-----------+----------+--------------+ FV Prox  Full                                                        +---------+---------------+---------+-----------+----------+--------------+ FV Mid   Full                                                        +---------+---------------+---------+-----------+----------+--------------+ FV DistalFull                                                        +---------+---------------+---------+-----------+----------+--------------+ PFV      Full                                                        +---------+---------------+---------+-----------+----------+--------------+ POP      Partial        No       No                   Acute          +---------+---------------+---------+-----------+----------+--------------+ PTV      None  Acute           +---------+---------------+---------+-----------+----------+--------------+ PERO     Full                                                        +---------+---------------+---------+-----------+----------+--------------+ Gastroc  Partial                                      Acute          +---------+---------------+---------+-----------+----------+--------------+   +---------+---------------+---------+-----------+----------+--------------+ LEFT     CompressibilityPhasicitySpontaneityPropertiesThrombus Aging +---------+---------------+---------+-----------+----------+--------------+ CFV      Full           Yes      Yes                                 +---------+---------------+---------+-----------+----------+--------------+ SFJ      Full                                                        +---------+---------------+---------+-----------+----------+--------------+ FV Prox  Full                                                        +---------+---------------+---------+-----------+----------+--------------+ FV Mid   Full                                                        +---------+---------------+---------+-----------+----------+--------------+ FV DistalFull                                                        +---------+---------------+---------+-----------+----------+--------------+ PFV      Full                                                        +---------+---------------+---------+-----------+----------+--------------+ POP      Full           Yes      Yes                                 +---------+---------------+---------+-----------+----------+--------------+ PTV      Full                                                        +---------+---------------+---------+-----------+----------+--------------+  PERO     Full                                                         +---------+---------------+---------+-----------+----------+--------------+     Summary: Right: Findings consistent with acute deep vein thrombosis involving the right popliteal vein, right posterior tibial veins, and right gastrocnemius veins. No cystic structure found in the popliteal fossa. Left: There is no evidence of deep vein thrombosis in the lower extremity. No cystic structure found in the popliteal fossa.  *See table(s) above for measurements and observations. Electronically signed by Fabienne Bruns MD on 08/01/2019 at 4:08:12 PM.    Final     Subjective: No overnight events.  No more cough or sputum production.  Had one episode of thick phlegm with few traces of blood.  Still has some right upper back pain, using Tylenol.   Discharge Exam: Vitals:   08/04/19 2039 08/05/19 0511  BP: (!) 136/92 129/80  Pulse: 75 73  Resp: 18 16  Temp: 98.3 F (36.8 C) 99.3 F (37.4 C)  SpO2: 99% 97%   Vitals:   08/04/19 0505 08/04/19 1329 08/04/19 2039 08/05/19 0511  BP: 113/66 133/77 (!) 136/92 129/80  Pulse: 76 83 75 73  Resp: 19 (!) 22 18 16   Temp: 98 F (36.7 C) 98 F (36.7 C) 98.3 F (36.8 C) 99.3 F (37.4 C)  TempSrc: Oral Oral    SpO2: 100% 100% 99% 97%  Weight:      Height:        General: Pt is alert, awake, not in acute distress, on room air. Cardiovascular: RRR, S1/S2 +, no rubs, no gallops Respiratory: CTA bilaterally, no wheezing, no rhonchi Abdominal: Soft, NT, ND, bowel sounds + Extremities: no edema, no cyanosis, has some deformity of the right femur.  Distal neurovascular status intact.    The results of significant diagnostics from this hospitalization (including imaging, microbiology, ancillary and laboratory) are listed below for reference.     Microbiology: Recent Results (from the past 240 hour(s))  Culture, blood (routine x 2)     Status: None (Preliminary result)   Collection Time: 08/01/19  8:18 AM   Specimen: Right Antecubital; Blood  Result Value  Ref Range Status   Specimen Description   Final    RIGHT ANTECUBITAL Performed at [facility], [address], [place], [place]    Special Requests   Final    BOTTLES DRAWN AEROBIC AND ANAEROBIC Blood Culture adequate volume Performed at [facility], [address], [place], [place]    Culture   Final    NO GROWTH 4 DAYS Performed at [facility], [address] [place], [place]    Report Status PENDING  Incomplete  SARS Coronavirus 2 [facility] order, Performed in [facility] hospital lab) Nasopharyngeal Nasopharyngeal Swab     Status: None   Collection Time: 08/01/19  8:18 AM   Specimen: Nasopharyngeal Swab  Result Value Ref Range Status   SARS Coronavirus 2 NEGATIVE NEGATIVE Final    Comment: (NOTE) If result is NEGATIVE SARS-CoV-2 target nucleic acids are NOT DETECTED. The SARS-CoV-2 RNA is generally detectable in upper and lower  respiratory specimens during the acute phase of infection. The lowest  concentration of SARS-CoV-2 viral copies this assay  can detect is 250  copies / mL. A negative result does not preclude SARS-CoV-2 infection  and should not be used as the sole basis for treatment or other  patient management decisions.  A negative result may occur with  improper specimen collection / handling, submission of specimen other  than nasopharyngeal swab, presence of viral mutation(s) within the  areas targeted by this assay, and inadequate number of viral copies  (<250 copies / mL). A negative result must be combined with clinical  observations, patient history, and epidemiological information. If result is POSITIVE SARS-CoV-2 target nucleic acids are DETECTED. The SARS-CoV-2 RNA is generally detectable in upper and lower  respiratory specimens dur ing the acute phase of infection.  Positive  results are indicative of active infection with SARS-CoV-2.  Clinical  correlation with patient  history and other diagnostic information is  necessary to determine patient infection status.  Positive results do  not rule out bacterial infection or co-infection with other viruses. If result is PRESUMPTIVE POSTIVE SARS-CoV-2 nucleic acids MAY BE PRESENT.   A presumptive positive result was obtained on the submitted specimen  and confirmed on repeat testing.  While 2019 novel coronavirus  (SARS-CoV-2) nucleic acids may be present in the submitted sample  additional confirmatory testing may be necessary for epidemiological  and / or clinical management purposes  to differentiate between  SARS-CoV-2 and other Sarbecovirus currently known to infect humans.  If clinically indicated additional testing with an alternate test  methodology [phone]) is advised. The SARS-CoV-2 RNA is generally  detectable in upper and lower respiratory sp ecimens during the acute  phase of infection. The expected result is Negative. Fact Sheet for Patients:  StrictlyIdeas.no Fact Sheet for Healthcare Providers: BankingDealers.co.za This test is not yet approved or cleared by the [place] FDA and has been authorized for detection and/or diagnosis of SARS-CoV-2 by FDA under an Emergency Use Authorization (EUA).  This EUA will remain in effect (meaning this test can be used) for the duration of the COVID-19 declaration under Section 564(b)(1) of the Act, 21 U.S.C. section 360bbb-3(b)(1), unless the authorization is terminated or revoked sooner. Performed at [facility], [place] [address], [place], [place] [address]   Urine culture     Status: None   Collection Time: 08/01/19  8:18 AM   Specimen: In/Out Cath Urine  Result Value Ref Range Status   Specimen Description   Final    IN/OUT CATH URINE Performed at [facility] [address], [place], [place] [address]    Special Requests   Final    NONE Performed at  [facility], [place] [address], [place], [place] [address]    Culture   Final    NO GROWTH Performed at [facility], [place] [address], [place], [place] [address]    Report Status 08/02/2019 FINAL  Final     Labs: BNP (last 3 results) No results for input(s): BNP in the last 8760 hours. Basic Metabolic Panel: Recent Labs  Lab 08/01/19 0818 08/01/19 2016 08/02/19 0639 08/03/19 0651  NA 128* 128* 131* 131*  K 3.8 3.5 2.9* 3.4*  CL 88* 93* 97* 100  CO2 24 23 22 24   GLUCOSE 131* 114* 88 127*  BUN 13 10 7  5*  CREATININE 0.77 0.63 0.56* 0.52*  CALCIUM 8.4* 7.6* 7.3* 7.6*  MG  --  1.4*  --  1.6*  PHOS  --  2.6  --   --  Liver Function Tests: Recent Labs  Lab 08/01/19 0818 08/01/19 2016 08/02/19 0639  AST 20 28 14*  ALT 9 11 7   ALKPHOS 114 90 76  BILITOT 2.2* 1.2 1.1  PROT 7.9 6.7 5.7*  ALBUMIN 3.5 2.8* 2.5*   Recent Labs  Lab 08/01/19 0818  LIPASE 19   No results for input(s): AMMONIA in the last 168 hours. CBC: Recent Labs  Lab 08/01/19 0818 08/01/19 2016 08/02/19 0639 08/03/19 0651  WBC 9.6 8.9 6.7 6.3  NEUTROABS 6.8  --   --  3.8  HGB 13.8 11.6* 11.7* 10.5*  HCT 41.9 35.3* 35.7* 32.2*  MCV 83.5 84.0 85.8 85.0  PLT 245 227 201 227   Cardiac Enzymes: No results for input(s): CKTOTAL, CKMB, CKMBINDEX, TROPONINI in the last 168 hours. BNP: Invalid input(s): POCBNP CBG: No results for input(s): GLUCAP in the last 168 hours. D-Dimer No results for input(s): DDIMER in the last 72 hours. Hgb A1c No results for input(s): HGBA1C in the last 72 hours. Lipid Profile No results for input(s): CHOL, HDL, LDLCALC, TRIG, CHOLHDL, LDLDIRECT in the last 72 hours. Thyroid function studies No results for input(s): TSH, T4TOTAL, T3FREE, THYROIDAB in the last 72 hours.  Invalid input(s): FREET3 Anemia work up No results for input(s): VITAMINB12, FOLATE, FERRITIN, TIBC, IRON, RETICCTPCT in the last 72 hours. Urinalysis    Component Value  Date/Time   COLORURINE STRAW (A) 08/01/2019 0818   APPEARANCEUR CLEAR 08/01/2019 0818   LABSPEC 1.003 (L) 08/01/2019 0818   PHURINE 6.0 08/01/2019 0818   GLUCOSEU NEGATIVE 08/01/2019 0818   HGBUR MODERATE (A) 08/01/2019 0818   BILIRUBINUR NEGATIVE 08/01/2019 0818   KETONESUR NEGATIVE 08/01/2019 0818   PROTEINUR NEGATIVE 08/01/2019 0818   UROBILINOGEN 1.0 07/29/2014 1837   NITRITE NEGATIVE 08/01/2019 0818   LEUKOCYTESUR NEGATIVE 08/01/2019 0818   Sepsis Labs Invalid input(s): PROCALCITONIN,  WBC,  LACTICIDVEN Microbiology Recent Results (from the past 240 hour(s))  Culture, blood (routine x 2)     Status: None (Preliminary result)   Collection Time: 08/01/19  8:18 AM   Specimen: Right Antecubital; Blood  Result Value Ref Range Status   Specimen Description   Final    RIGHT ANTECUBITAL Performed at [facility], [address] [place], [place] [address]    Special Requests   Final    BOTTLES DRAWN AEROBIC AND ANAEROBIC Blood Culture adequate volume Performed at [facility], [address], [place], [place] [address]    Culture   Final    NO GROWTH 4 DAYS Performed at [facility], [address] [place], [place] [address]    Report Status PENDING  Incomplete  SARS Coronavirus 2 [facility] order, Performed in [facility] hospital lab) Nasopharyngeal Nasopharyngeal Swab     Status: None   Collection Time: 08/01/19  8:18 AM   Specimen: Nasopharyngeal Swab  Result Value Ref Range Status   SARS Coronavirus 2 NEGATIVE NEGATIVE Final    Comment: (NOTE) If result is NEGATIVE SARS-CoV-2 target nucleic acids are NOT DETECTED. The SARS-CoV-2 RNA is generally detectable in upper and lower  respiratory specimens during the acute phase of infection. The lowest  concentration of SARS-CoV-2 viral copies this assay can detect is 250  copies / mL. A negative result does not preclude SARS-CoV-2 infection  and should not be used as the sole basis  for treatment or other  patient management decisions.  A negative result may occur with  improper specimen collection / handling, submission  of specimen other  than nasopharyngeal swab, presence of viral mutation(s) within the  areas targeted by this assay, and inadequate number of viral copies  (<250 copies / mL). A negative result must be combined with clinical  observations, patient history, and epidemiological information. If result is POSITIVE SARS-CoV-2 target nucleic acids are DETECTED. The SARS-CoV-2 RNA is generally detectable in upper and lower  respiratory specimens dur ing the acute phase of infection.  Positive  results are indicative of active infection with SARS-CoV-2.  Clinical  correlation with patient history and other diagnostic information is  necessary to determine patient infection status.  Positive results do  not rule out bacterial infection or co-infection with other viruses. If result is PRESUMPTIVE POSTIVE SARS-CoV-2 nucleic acids MAY BE PRESENT.   A presumptive positive result was obtained on the submitted specimen  and confirmed on repeat testing.  While 2019 novel coronavirus  (SARS-CoV-2) nucleic acids may be present in the submitted sample  additional confirmatory testing may be necessary for epidemiological  and / or clinical management purposes  to differentiate between  SARS-CoV-2 and other Sarbecovirus currently known to infect humans.  If clinically indicated additional testing with an alternate test  methodology [phone]) is advised. The SARS-CoV-2 RNA is generally  detectable in upper and lower respiratory sp ecimens during the acute  phase of infection. The expected result is Negative. Fact Sheet for Patients:  BoilerBrush.com.cy Fact Sheet for Healthcare Providers: https://pope.com/ This test is not yet approved or cleared by the [place] FDA and has been authorized for detection and/or  diagnosis of SARS-CoV-2 by FDA under an Emergency Use Authorization (EUA).  This EUA will remain in effect (meaning this test can be used) for the duration of the COVID-19 declaration under Section 564(b)(1) of the Act, 21 U.S.C. section 360bbb-3(b)(1), unless the authorization is terminated or revoked sooner. Performed at [facility], [address], [place], [place] [address]   Urine culture     Status: None   Collection Time: 08/01/19  8:18 AM   Specimen: In/Out Cath Urine  Result Value Ref Range Status   Specimen Description   Final    IN/OUT CATH URINE Performed at [facility], [address], [place], [place] [address]    Special Requests   Final    NONE Performed at [facility], [address], [place], [place] [address]    Culture   Final    NO GROWTH Performed at [facility], [address] [place], [place] [address]    Report Status 08/02/2019 FINAL  Final     Time coordinating discharge:  35 minutes  SIGNED:   Dorcas Carrow, MD  [facility] 08/05/2019, 12:08 PM

## 2019-08-06 LAB — CULTURE, BLOOD (ROUTINE X 2)
Culture: NO GROWTH
Special Requests: ADEQUATE

## 2019-08-13 ENCOUNTER — Other Ambulatory Visit: Payer: Self-pay

## 2019-08-13 ENCOUNTER — Emergency Department (HOSPITAL_COMMUNITY): Payer: Self-pay

## 2019-08-13 ENCOUNTER — Inpatient Hospital Stay (HOSPITAL_COMMUNITY)
Admission: EM | Admit: 2019-08-13 | Discharge: 2019-08-18 | DRG: 368 | Disposition: A | Discharge status: Home / Self Care | Payer: Self-pay | Attending: Internal Medicine | Admitting: Internal Medicine

## 2019-08-13 ENCOUNTER — Encounter (HOSPITAL_COMMUNITY): Payer: Self-pay | Admitting: *Deleted

## 2019-08-13 DIAGNOSIS — K920 Hematemesis: Secondary | ICD-10-CM | POA: Diagnosis present

## 2019-08-13 DIAGNOSIS — E785 Hyperlipidemia, unspecified: Secondary | ICD-10-CM | POA: Diagnosis present

## 2019-08-13 DIAGNOSIS — Z8701 Personal history of pneumonia (recurrent): Secondary | ICD-10-CM

## 2019-08-13 DIAGNOSIS — Z7289 Other problems related to lifestyle: Secondary | ICD-10-CM

## 2019-08-13 DIAGNOSIS — Z20828 Contact with and (suspected) exposure to other viral communicable diseases: Secondary | ICD-10-CM | POA: Diagnosis present

## 2019-08-13 DIAGNOSIS — Z8249 Family history of ischemic heart disease and other diseases of the circulatory system: Secondary | ICD-10-CM

## 2019-08-13 DIAGNOSIS — K2901 Acute gastritis with bleeding: Secondary | ICD-10-CM | POA: Diagnosis present

## 2019-08-13 DIAGNOSIS — F17210 Nicotine dependence, cigarettes, uncomplicated: Secondary | ICD-10-CM | POA: Diagnosis present

## 2019-08-13 DIAGNOSIS — K922 Gastrointestinal hemorrhage, unspecified: Secondary | ICD-10-CM | POA: Diagnosis present

## 2019-08-13 DIAGNOSIS — Z7901 Long term (current) use of anticoagulants: Secondary | ICD-10-CM

## 2019-08-13 DIAGNOSIS — Z86711 Personal history of pulmonary embolism: Secondary | ICD-10-CM

## 2019-08-13 DIAGNOSIS — D500 Iron deficiency anemia secondary to blood loss (chronic): Secondary | ICD-10-CM | POA: Diagnosis present

## 2019-08-13 DIAGNOSIS — D62 Acute posthemorrhagic anemia: Secondary | ICD-10-CM | POA: Diagnosis present

## 2019-08-13 DIAGNOSIS — F1020 Alcohol dependence, uncomplicated: Secondary | ICD-10-CM | POA: Diagnosis present

## 2019-08-13 DIAGNOSIS — J984 Other disorders of lung: Secondary | ICD-10-CM

## 2019-08-13 DIAGNOSIS — R911 Solitary pulmonary nodule: Secondary | ICD-10-CM | POA: Diagnosis present

## 2019-08-13 DIAGNOSIS — D649 Anemia, unspecified: Secondary | ICD-10-CM | POA: Diagnosis present

## 2019-08-13 DIAGNOSIS — E876 Hypokalemia: Secondary | ICD-10-CM | POA: Clinically undetermined

## 2019-08-13 DIAGNOSIS — Z789 Other specified health status: Secondary | ICD-10-CM

## 2019-08-13 DIAGNOSIS — K449 Diaphragmatic hernia without obstruction or gangrene: Secondary | ICD-10-CM | POA: Diagnosis present

## 2019-08-13 DIAGNOSIS — Z9181 History of falling: Secondary | ICD-10-CM

## 2019-08-13 DIAGNOSIS — F1090 Alcohol use, unspecified, uncomplicated: Secondary | ICD-10-CM

## 2019-08-13 DIAGNOSIS — E1169 Type 2 diabetes mellitus with other specified complication: Secondary | ICD-10-CM | POA: Diagnosis present

## 2019-08-13 DIAGNOSIS — K209 Esophagitis, unspecified without bleeding: Secondary | ICD-10-CM | POA: Diagnosis present

## 2019-08-13 DIAGNOSIS — R296 Repeated falls: Secondary | ICD-10-CM | POA: Diagnosis present

## 2019-08-13 DIAGNOSIS — I10 Essential (primary) hypertension: Secondary | ICD-10-CM | POA: Diagnosis present

## 2019-08-13 DIAGNOSIS — F1290 Cannabis use, unspecified, uncomplicated: Secondary | ICD-10-CM | POA: Diagnosis present

## 2019-08-13 DIAGNOSIS — Z833 Family history of diabetes mellitus: Secondary | ICD-10-CM

## 2019-08-13 DIAGNOSIS — K2101 Gastro-esophageal reflux disease with esophagitis, with bleeding: Principal | ICD-10-CM | POA: Diagnosis present

## 2019-08-13 DIAGNOSIS — R042 Hemoptysis: Secondary | ICD-10-CM | POA: Diagnosis present

## 2019-08-13 HISTORY — DX: Other pulmonary embolism without acute cor pulmonale: I26.99

## 2019-08-13 LAB — CBC
HCT: 33.4 % — ABNORMAL LOW (ref 39.0–52.0)
HCT: 31.8 % — ABNORMAL LOW (ref 39.0–52.0)
HCT: 33 % — ABNORMAL LOW (ref 39.0–52.0)
Hemoglobin: 10.8 g/dL — ABNORMAL LOW (ref 13.0–17.0)
Hemoglobin: 10.4 g/dL — ABNORMAL LOW (ref 13.0–17.0)
Hemoglobin: 10.6 g/dL — ABNORMAL LOW (ref 13.0–17.0)
MCH: 27.3 pg (ref 26.0–34.0)
MCH: 27.4 pg (ref 26.0–34.0)
MCH: 27.3 pg (ref 26.0–34.0)
MCHC: 32.3 g/dL (ref 30.0–36.0)
MCHC: 32.7 g/dL (ref 30.0–36.0)
MCHC: 32.1 g/dL (ref 30.0–36.0)
MCV: 84.3 fL (ref 80.0–100.0)
MCV: 83.9 fL (ref 80.0–100.0)
MCV: 85.1 fL (ref 80.0–100.0)
Platelets: 607 10*3/uL — ABNORMAL HIGH (ref 150–400)
Platelets: 586 10*3/uL — ABNORMAL HIGH (ref 150–400)
Platelets: 568 10*3/uL — ABNORMAL HIGH (ref 150–400)
RBC: 3.96 MIL/uL — ABNORMAL LOW (ref 4.22–5.81)
RBC: 3.79 MIL/uL — ABNORMAL LOW (ref 4.22–5.81)
RBC: 3.88 MIL/uL — ABNORMAL LOW (ref 4.22–5.81)
RDW: 17.8 % — ABNORMAL HIGH (ref 11.5–15.5)
RDW: 17.9 % — ABNORMAL HIGH (ref 11.5–15.5)
RDW: 18.2 % — ABNORMAL HIGH (ref 11.5–15.5)
WBC: 8.5 10*3/uL (ref 4.0–10.5)
WBC: 9.1 10*3/uL (ref 4.0–10.5)
WBC: 8.7 10*3/uL (ref 4.0–10.5)
nRBC: 0 % (ref 0.0–0.2)
nRBC: 0 % (ref 0.0–0.2)
nRBC: 0 % (ref 0.0–0.2)

## 2019-08-13 LAB — CBC WITH DIFFERENTIAL/PLATELET
Abs Immature Granulocytes: 0.04 10*3/uL (ref 0.00–0.07)
Basophils Absolute: 0.1 10*3/uL (ref 0.0–0.1)
Basophils Relative: 1 %
Eosinophils Absolute: 0 10*3/uL (ref 0.0–0.5)
Eosinophils Relative: 0 %
HCT: 39.3 % (ref 39.0–52.0)
Hemoglobin: 12.9 g/dL — ABNORMAL LOW (ref 13.0–17.0)
Immature Granulocytes: 1 %
Lymphocytes Relative: 24 %
Lymphs Abs: 2.1 10*3/uL (ref 0.7–4.0)
MCH: 27.4 pg (ref 26.0–34.0)
MCHC: 32.8 g/dL (ref 30.0–36.0)
MCV: 83.6 fL (ref 80.0–100.0)
Monocytes Absolute: 0.4 10*3/uL (ref 0.1–1.0)
Monocytes Relative: 4 %
Neutro Abs: 6 10*3/uL (ref 1.7–7.7)
Neutrophils Relative %: 70 %
Platelets: 735 10*3/uL — ABNORMAL HIGH (ref 150–400)
RBC: 4.7 MIL/uL (ref 4.22–5.81)
RDW: 17.8 % — ABNORMAL HIGH (ref 11.5–15.5)
WBC: 8.7 10*3/uL (ref 4.0–10.5)
nRBC: 0 % (ref 0.0–0.2)

## 2019-08-13 LAB — GLUCOSE, CAPILLARY: Glucose-Capillary: 77 mg/dL (ref 70–99)

## 2019-08-13 LAB — TYPE AND SCREEN
ABO/RH(D): O POS
Antibody Screen: NEGATIVE

## 2019-08-13 LAB — URINALYSIS, ROUTINE W REFLEX MICROSCOPIC
Bacteria, UA: NONE SEEN
Bilirubin Urine: NEGATIVE
Glucose, UA: NEGATIVE mg/dL
Ketones, ur: NEGATIVE mg/dL
Leukocytes,Ua: NEGATIVE
Nitrite: NEGATIVE
Protein, ur: NEGATIVE mg/dL
Specific Gravity, Urine: 1.013 (ref 1.005–1.030)
pH: 5 (ref 5.0–8.0)

## 2019-08-13 LAB — COMPREHENSIVE METABOLIC PANEL
ALT: 13 U/L (ref 0–44)
AST: 23 U/L (ref 15–41)
Albumin: 3 g/dL — ABNORMAL LOW (ref 3.5–5.0)
Alkaline Phosphatase: 129 U/L — ABNORMAL HIGH (ref 38–126)
Anion gap: 16 — ABNORMAL HIGH (ref 5–15)
BUN: <5 mg/dL — ABNORMAL LOW (ref 6–20)
CO2: 24 mmol/L (ref 22–32)
Calcium: 8.3 mg/dL — ABNORMAL LOW (ref 8.9–10.3)
Chloride: 101 mmol/L (ref 98–111)
Creatinine, Ser: 0.64 mg/dL (ref 0.61–1.24)
GFR calc Af Amer: >60 mL/min (ref >60)
GFR calc non Af Amer: >60 mL/min (ref >60)
Glucose, Bld: 156 mg/dL — ABNORMAL HIGH (ref 70–99)
Potassium: 3.5 mmol/L (ref 3.5–5.1)
Sodium: 141 mmol/L (ref 135–145)
Total Bilirubin: 0.2 mg/dL — ABNORMAL LOW (ref 0.3–1.2)
Total Protein: 7.9 g/dL (ref 6.5–8.1)

## 2019-08-13 LAB — LACTIC ACID, PLASMA
Lactic Acid, Venous: 2.8 mmol/L (ref 0.5–1.9)
Lactic Acid, Venous: 2.9 mmol/L (ref 0.5–1.9)

## 2019-08-13 LAB — PROTIME-INR
INR: 1.2 (ref 0.8–1.2)
Prothrombin Time: 15.3 seconds — ABNORMAL HIGH (ref 11.4–15.2)

## 2019-08-13 LAB — RAPID URINE DRUG SCREEN, HOSP PERFORMED
Amphetamines: NOT DETECTED
Barbiturates: NOT DETECTED
Benzodiazepines: NOT DETECTED
Cocaine: NOT DETECTED
Opiates: NOT DETECTED
Tetrahydrocannabinol: POSITIVE — AB

## 2019-08-13 LAB — ETHANOL: Alcohol, Ethyl (B): 276 mg/dL — ABNORMAL HIGH (ref <10)

## 2019-08-13 LAB — RETICULOCYTES
Immature Retic Fract: 12.9 % (ref 2.3–15.9)
RBC.: 3.96 MIL/uL — ABNORMAL LOW (ref 4.22–5.81)
Retic Count, Absolute: 42 10*3/uL (ref 19.0–186.0)
Retic Ct Pct: 1.1 % (ref 0.4–3.1)

## 2019-08-13 LAB — LIPASE, BLOOD: Lipase: 33 U/L (ref 11–51)

## 2019-08-13 LAB — APTT: aPTT: 39 seconds — ABNORMAL HIGH (ref 24–36)

## 2019-08-13 LAB — SARS CORONAVIRUS 2 BY RT PCR (HOSPITAL ORDER, PERFORMED IN ~~LOC~~ HOSPITAL LAB): SARS Coronavirus 2: NEGATIVE

## 2019-08-13 MED ORDER — MORPHINE SULFATE (PF) 2 MG/ML IV SOLN: 2.0000 mg | INTRAVENOUS | Status: DC | PRN

## 2019-08-13 MED ORDER — ONDANSETRON HCL 4 MG/2ML IJ SOLN: 4.0000 mg | Freq: Four times a day (QID) | INTRAMUSCULAR | Status: DC | PRN

## 2019-08-13 MED ORDER — LORAZEPAM 1 MG PO TABS: 1.0000 mg | ORAL_TABLET | ORAL | Status: AC | PRN

## 2019-08-13 MED ORDER — LACTATED RINGERS IV SOLN
INTRAVENOUS | Status: DC
  Administered 2019-08-13 – 2019-08-15 (×3): via INTRAVENOUS

## 2019-08-13 MED ORDER — FOLIC ACID 1 MG PO TABS
1.0000 mg | ORAL_TABLET | Freq: Every day | ORAL | Status: DC
  Administered 2019-08-14 – 2019-08-18 (×5): 1 mg via ORAL
  Filled 2019-08-13 (×5): qty 1

## 2019-08-13 MED ORDER — SODIUM CHLORIDE 0.9 % IV SOLN
80.0000 mg | Freq: Once | INTRAVENOUS | Status: AC
  Administered 2019-08-13: 07:00:00 80 mg via INTRAVENOUS
  Filled 2019-08-13: qty 80

## 2019-08-13 MED ORDER — LORAZEPAM 2 MG/ML IJ SOLN
0.0000 mg | INTRAMUSCULAR | Status: DC
  Administered 2019-08-13: 09:00:00 4 mg via INTRAVENOUS
  Administered 2019-08-13: 2 mg via INTRAVENOUS
  Administered 2019-08-13: 1 mg via INTRAVENOUS
  Administered 2019-08-14: 06:00:00 2 mg via INTRAVENOUS
  Filled 2019-08-13 (×2): qty 1
  Filled 2019-08-13: qty 2
  Filled 2019-08-13: qty 1

## 2019-08-13 MED ORDER — ADULT MULTIVITAMIN W/MINERALS CH
1.0000 | ORAL_TABLET | Freq: Every day | ORAL | Status: DC
  Administered 2019-08-14 – 2019-08-18 (×5): 1 via ORAL
  Filled 2019-08-13 (×5): qty 1

## 2019-08-13 MED ORDER — CHLORHEXIDINE GLUCONATE CLOTH 2 % EX PADS
6.0000 | MEDICATED_PAD | Freq: Every day | CUTANEOUS | Status: DC
  Administered 2019-08-14 – 2019-08-17 (×3): 6 via TOPICAL

## 2019-08-13 MED ORDER — SODIUM CHLORIDE 0.9 % IV BOLUS
1000.0000 mL | Freq: Once | INTRAVENOUS | Status: AC
  Administered 2019-08-13: 1000 mL via INTRAVENOUS

## 2019-08-13 MED ORDER — ONDANSETRON HCL 4 MG/2ML IJ SOLN
4.0000 mg | Freq: Four times a day (QID) | INTRAMUSCULAR | Status: DC | PRN
  Administered 2019-08-13: 4 mg via INTRAVENOUS
  Filled 2019-08-13: qty 2

## 2019-08-13 MED ORDER — ONDANSETRON HCL 4 MG PO TABS: 4.0000 mg | ORAL_TABLET | Freq: Four times a day (QID) | ORAL | Status: DC | PRN

## 2019-08-13 MED ORDER — ACETAMINOPHEN 650 MG RE SUPP: 650.0000 mg | Freq: Four times a day (QID) | RECTAL | Status: DC | PRN

## 2019-08-13 MED ORDER — SODIUM CHLORIDE 0.9% FLUSH
3.0000 mL | Freq: Two times a day (BID) | INTRAVENOUS | Status: DC
  Administered 2019-08-13 – 2019-08-17 (×4): 3 mL via INTRAVENOUS

## 2019-08-13 MED ORDER — SODIUM CHLORIDE 0.9% FLUSH
3.0000 mL | Freq: Once | INTRAVENOUS | Status: AC
  Administered 2019-08-13: 07:00:00 3 mL via INTRAVENOUS

## 2019-08-13 MED ORDER — LORAZEPAM 2 MG/ML IJ SOLN: 0.0000 mg | Freq: Three times a day (TID) | INTRAMUSCULAR | Status: DC

## 2019-08-13 MED ORDER — VITAMIN B-1 100 MG PO TABS
100.0000 mg | ORAL_TABLET | Freq: Every day | ORAL | Status: DC
  Administered 2019-08-14 – 2019-08-18 (×5): 100 mg via ORAL
  Filled 2019-08-13 (×5): qty 1

## 2019-08-13 MED ORDER — ACETAMINOPHEN 325 MG PO TABS
650.0000 mg | ORAL_TABLET | Freq: Four times a day (QID) | ORAL | Status: DC | PRN
  Administered 2019-08-15: 650 mg via ORAL
  Filled 2019-08-13: qty 2

## 2019-08-13 MED ORDER — PANTOPRAZOLE SODIUM 40 MG IV SOLR: 40.0000 mg | Freq: Two times a day (BID) | INTRAVENOUS | Status: DC

## 2019-08-13 MED ORDER — THIAMINE HCL 100 MG/ML IJ SOLN
100.0000 mg | Freq: Every day | INTRAMUSCULAR | Status: DC
  Administered 2019-08-13: 11:00:00 100 mg via INTRAVENOUS
  Filled 2019-08-13: qty 2

## 2019-08-13 MED ORDER — ONDANSETRON HCL 4 MG/2ML IJ SOLN
4.0000 mg | Freq: Once | INTRAMUSCULAR | Status: AC
  Administered 2019-08-13: 4 mg via INTRAVENOUS
  Filled 2019-08-13: qty 2

## 2019-08-13 MED ORDER — SODIUM CHLORIDE 0.9 % IV SOLN
8.0000 mg/h | INTRAVENOUS | Status: DC
  Administered 2019-08-13 – 2019-08-15 (×4): 8 mg/h via INTRAVENOUS
  Filled 2019-08-13 (×6): qty 80

## 2019-08-13 MED ORDER — LORAZEPAM 2 MG/ML IJ SOLN: 1.0000 mg | INTRAMUSCULAR | Status: AC | PRN

## 2019-08-13 NOTE — ED Notes (Signed)
Asked for urine  

## 2019-08-13 NOTE — H&P (Signed)
[facility] History and Physical   Patient: Richard Lopez:[id number]   PCP: [name], NP DOB: [birth year]   DOA: 08/13/2019   DOS: 08/13/2019   DOS: the patient was seen and examined on 08/13/2019  Patient coming from: The patient is coming from Home  Chief Complaint: Vomiting of blood  HPI: MARGUIS MATHIESON is a 43 y.o. male with Past medical history of alcohol abuse, recurrent fall with multiple fractures, diet-controlled diabetes, HTN, marijuana use, recent admission for pneumonia, PE on anticoagulation. Patient presents with complaints of vomiting of blood.  Patient is a heavy alcohol drinker. He drank 40 ounces of beer today and after that he started having episodes of nausea as well as mild abdominal pain which is located in the epigastric region. The pain was crampy in nature. After that he had an episode of vomiting which was bright red blood. He had a few other episodes after that for which he came to the hospital. Patient was intoxicated at the time of my evaluation but continues to report some nausea as well as epigastric pain. Denies any shortness of breath denies any chest pain denies any diarrhea or blood.  Denies any bleeding anywhere else. He remains compliant with all his medications.  ED Course: Presents with above complaint.  Patient was referred for admission secondary to active GI bleed in a patient who requires anticoagulation  At his baseline ambulates without assistance independent for most of his ADL;  manages his medication on his own.  Review of Systems: as mentioned in the history of present illness.  All other systems reviewed and are negative.  Past Medical History:  Diagnosis Date  . Anxiety   . 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 Depression   . Diabetes mellitus    diet controlled  . Essential hypertension   . Gastritis   . Gout   . Pulmonary embolism [facility])    Past Surgical History:  Procedure Laterality Date  .  EXTERNAL FIXATION LEG Left 11/07/2018   Procedure: EXTERNAL FIXATION LEFT LOWER LEG;  Surgeon: Samson Frederic, MD;  Location: [facility];  Service: Orthopedics;  Laterality: Left;  . EXTERNAL FIXATION REMOVAL Left 11/08/2018   Procedure: REMOVAL EXTERNAL FIXATION LEG;  Surgeon: Roby Lofts, MD;  Location: [facility] OR;  Service: Orthopedics;  Laterality: Left;  . INTRAMEDULLARY (IM) NAIL INTERTROCHANTERIC Right 03/26/2019   Procedure: INTRAMEDULLARY (IM) NAIL INTERTROCHANTRIC;  Surgeon: Roby Lofts, MD;  Location: [facility];  Service: Orthopedics;  Laterality: Right;  . INTRAMEDULLARY (IM) NAIL INTERTROCHANTERIC Right 05/17/2019   Procedure: Intramedullary (Im) Nail Intertroch with circlage wiring;  Surgeon: Myrene Galas, MD;  Location: [facility] OR;  Service: Orthopedics;  Laterality: Right;  . NO PAST SURGERIES    . OPEN REDUCTION INTERNAL FIXATION (ORIF) TIBIA/FIBULA FRACTURE Left 11/08/2018   Procedure: OPEN REDUCTION INTERNAL FIXATION (ORIF) TIBIA/FIBULA FRACTURE;  Surgeon: Roby Lofts, MD;  Location: [facility];  Service: Orthopedics;  Laterality: Left;  . ORIF FEMUR FRACTURE Right 05/17/2019   Procedure: REMOVAL  OF HARDWARE;  Surgeon: Myrene Galas, MD;  Location: [facility];  Service: Orthopedics;  Laterality: Right;   Social History:  reports that he has been smoking cigarettes. He has been smoking about 0.00 packs per day. He has never used smokeless tobacco. He reports current alcohol use of about 200.0 standard drinks of alcohol per week. He reports current drug use. Drug: Marijuana.  No Known Allergies  Family history reviewed and not pertinent Family History  Problem Relation Age of Onset  . Diabetes Mellitus II Father   . Diabetes Mellitus II Other   . CAD Other      Prior to Admission medications   Medication Sig Start Date End Date Taking? Authorizing Provider  amoxicillin-clavulanate (AUGMENTIN) 875-125 MG tablet Take 1 tablet by mouth 2 (two) times daily.   Yes [provider]   cholecalciferol (VITAMIN D3) 25 MCG (1000 UT) tablet Take 2 tablets (2,000 Units total) by mouth 2 (two) times daily. 05/19/19  Yes [name], PA-C  Eliquis DVT/PE Starter Pack (ELIQUIS STARTER PACK) 5 MG TABS Take as directed on package: start with two-5mg  tablets twice daily for 7 days. On day 8, switch to one-5mg  tablet twice daily. 08/04/19  Yes Barb Merino, MD  guaiFENesin (MUCINEX) 600 MG 12 hr tablet Take 600 mg by mouth 2 (two) times daily as needed for cough or to loosen phlegm.   Yes [provider]  Multiple Vitamin (MULTIVITAMIN WITH MINERALS) TABS tablet Take 1 tablet by mouth daily. 05/20/19  Yes Aline August, MD  thiamine 100 MG tablet Take 1 tablet (100 mg total) by mouth daily. Patient not taking: Reported on 08/13/2019 05/19/19   Aline August, MD    Physical Exam: Vitals:   08/13/19 1328 08/13/19 1537 08/13/19 1824 08/13/19 1917  BP: (!) 147/88 (!) 158/102 123/84 139/84  Pulse: 96 99 99 95  Resp: 18 19 15 20   Temp:      TempSrc:      SpO2: 100% 100% 98% 99%  Weight:      Height:        General: alert and oriented to time, place, and person. Appear in mild distress, affect appropriate Eyes: PERRL, Conjunctiva normal ENT: Oral Mucosa Clear, moist  Neck: no JVD, no Abnormal Mass Or lumps Cardiovascular: S1 and S2 Present, no Murmur, peripheral pulses symmetrical Respiratory: good respiratory effort, Bilateral Air entry equal and Decreased, no signs of accessory muscle use, bilateral  Crackles, no wheezes Abdomen: Bowel Sound present, Soft and mild epigastric tenderness, no hernia Skin: no rashes  Extremities: no Pedal edema, no calf tenderness Neurologic: without any new focal findings Gait not checked due to patient safety concerns  Data Reviewed: I have personally reviewed and interpreted labs, imaging as discussed below.  CBC: Recent Labs  Lab 08/13/19 0435 08/13/19 1121 08/13/19 1544  WBC 8.7 8.5 9.1  NEUTROABS 6.0  --   --   HGB 12.9*  10.8* 10.4*  HCT 39.3 33.4* 31.8*  MCV 83.6 84.3 83.9  PLT 735* 607* 829*   Basic Metabolic Panel: Recent Labs  Lab 08/13/19 0435  NA 141  K 3.5  CL 101  CO2 24  GLUCOSE 156*  BUN <5*  CREATININE 0.64  CALCIUM 8.3*   GFR: Estimated Creatinine Clearance: 155.5 mL/min (by C-G formula based on SCr of 0.64 mg/dL). Liver Function Tests: Recent Labs  Lab 08/13/19 0435  AST 23  ALT 13  ALKPHOS 129*  BILITOT 0.2*  PROT 7.9  ALBUMIN 3.0*   Recent Labs  Lab 08/13/19 0435  LIPASE 33   No results for input(s): AMMONIA in the last 168 hours. Coagulation Profile: Recent Labs  Lab 08/13/19 1121  INR 1.2   Cardiac Enzymes: No results for input(s): CKTOTAL, CKMB, CKMBINDEX, TROPONINI in the last 168 hours. BNP (last 3 results) No results for input(s): PROBNP in the last 8760 hours. HbA1C: No results for input(s): HGBA1C in the last 72 hours. CBG: No results for input(s): GLUCAP  in the last 168 hours. Lipid Profile: No results for input(s): CHOL, HDL, LDLCALC, TRIG, CHOLHDL, LDLDIRECT in the last 72 hours. Thyroid Function Tests: No results for input(s): TSH, T4TOTAL, FREET4, T3FREE, THYROIDAB in the last 72 hours. Anemia Panel: Recent Labs    08/13/19 1121  RETICCTPCT 1.1   Urine analysis:    Component Value Date/Time   COLORURINE YELLOW 08/13/2019 0325   APPEARANCEUR CLEAR 08/13/2019 0325   LABSPEC 1.013 08/13/2019 0325   PHURINE 5.0 08/13/2019 0325   GLUCOSEU NEGATIVE 08/13/2019 0325   HGBUR SMALL (A) 08/13/2019 0325   BILIRUBINUR NEGATIVE 08/13/2019 0325   KETONESUR NEGATIVE 08/13/2019 0325   PROTEINUR NEGATIVE 08/13/2019 0325   UROBILINOGEN 1.0 07/29/2014 1837   NITRITE NEGATIVE 08/13/2019 0325   LEUKOCYTESUR NEGATIVE 08/13/2019 0325    Radiological Exams on Admission: Dg Chest Port 1 View  Result Date: 08/13/2019 CLINICAL DATA:  Shortness of breath EXAM: PORTABLE CHEST 1 VIEW COMPARISON:  Chest radiograph 08/01/2019 Chest CTA 08/01/2019 FINDINGS:  There is increased opacity at the right lung base. Left lung is clear. No pleural effusion or pneumothorax. Normal cardiomediastinal contours. IMPRESSION: Increased opacity at the right lung base, which may indicate pneumonia or pulmonary infarct in the setting of the recent pulmonary embolus. Electronically Signed   By: Deatra RobinsonKevin  Herman M.D.   On: 08/13/2019 05:36    I reviewed all nursing notes, pharmacy notes, vitals, pertinent old records.  Assessment/Plan 1.  Acute upper GI bleed Hemoglobin dropped from 12.9-10.8 and repeat hemoglobin is 10.4. Patient is on Eliquis for his recent PE. Suspecting esophagitis but peptic ulcer disease cannot be ruled out production patient is on Protonix drip which we will continue. [facility] patient is scheduled for a possible endoscopy tomorrow. COVID-19 test negative.  2.  Acute pulmonary embolism Recently being admitted for acute pulmonary embolism and treated on Eliquis. Lower extremity Doppler were actually showing no clots on the left leg and below-knee clots on the right leg. Currently holding Eliquis in the setting of active bleeding. Difficult situation as the patient will require anticoagulation on an ongoing basis and his alcoholism will be difficult to manage along with anticoagulation. Highly recommended patient to stop alcohol drinking.  3.  Alcohol abuse. Currently on CIWA protocol. Continue stepdown monitoring.  4.  Recent aspiration pneumonia. Treated with IV Unasyn and oral Augmentin on discharge. Completed course on 08/12/2019.  5.  Diet-controlled diabetes. Type II controlled well. Monitor.   Nutrition: NPO  DVT Prophylaxis: SCD, pharmacological prophylaxis contraindicated due to Upper GI bleed  Advance goals of care discussion: Full code   Consults: I personally D[facility]  Family Communication: no family was present at bedside, at the time of interview.   Disposition:  Admitted as observation, step-down unit. Likely to be discharged home, in 1-2 days.  I have discussed plan of care as described above with RN and patient/family.   Author: Lynden OxfordPranav Krisalyn Yankowski, MD [facility] 08/13/2019 7:20 PM   To reach On-call, see care teams to locate the attending and reach out to them via [email or web address]. If 7PM-7AM, please contact night-coverage If you still have difficulty reaching the attending provider, please page the [facility] (Director on Call) for [facility] on amion for assistance.

## 2019-08-13 NOTE — ED Notes (Signed)
Pt assisted to restroom via w/c. Says he is also having diarrhea.

## 2019-08-13 NOTE — ED Provider Notes (Signed)
[facility]-EMERGENCY DEPT Provider Note   CSN: [id number] Arrival date & time: 08/13/19  0303    History   Chief Complaint Chief Complaint  Patient presents with  . Shortness of Breath    HPI Richard Lopez is a 43 y.o. male.   The history is provided by the patient.  Shortness of Breath He has history of diabetes, hypertension, alcohol abuse, pulmonary embolism anticoagulated on apixaban and comes in because of vomiting blood tonight.  He states that he feels like his pneumonia is worse.  He has vomited blood several times tonight but will not tell me an exact number.  He feels short of breath and has had chills but he has not had fever or sweats.  He denies chest pain but is complaining of mid abdominal pain.  He does admit to having drank 40 ounces of beer today and states he usually drinks 80 ounces of beer.  He had quit smoking cigarettes but continues to smoke marijuana.  He denies other drug use.  He denies exposure to COVID-19.  Past Medical History:  Diagnosis Date  . Anxiety   . 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 Depression   . Diabetes mellitus    diet controlled  . Essential hypertension   . Gastritis   . Gout   . Pulmonary embolism [facility])     Patient Active Problem List   Diagnosis Date Noted  . Cavitary lesion of lung 08/01/2019  . Gout 05/18/2019  . Peri-prosthetic fracture of femur at tip of prosthesis 05/16/2019  . Alcohol use 05/16/2019  . Intertrochanteric fracture of right hip (HCC) 04/14/2019  . Vitamin D deficiency 04/14/2019  . DTs (delirium tremens) (HCC) 04/07/2019  . Delirium tremens (HCC) 04/07/2019  . Encephalopathy acute   . Closed right hip fracture, initial encounter (HCC) 03/25/2019  . Alcohol dependence with intoxication (HCC) 03/25/2019  . Hypertensive urgency 03/25/2019  . Thrombocytopenia (HCC) 03/25/2019  . Effusion, right knee 03/25/2019  . Closed fracture of right femur (HCC)   . Metabolic acidosis    . Prediabetes 04/02/2017  . Gastritis 04/02/2017  . Marijuana abuse 04/01/2017  . Nausea with vomiting 07/29/2014  . Type 2 diabetes mellitus with hyperlipidemia (HCC) 07/29/2014  . Essential hypertension 07/29/2014  . Nausea & vomiting 07/29/2014    Past Surgical History:  Procedure Laterality Date  . EXTERNAL FIXATION LEG Left 11/07/2018   Procedure: EXTERNAL FIXATION LEFT LOWER LEG;  Surgeon: Samson Frederic, MD;  Location: [facility];  Service: Orthopedics;  Laterality: Left;  . EXTERNAL FIXATION REMOVAL Left 11/08/2018   Procedure: REMOVAL EXTERNAL FIXATION LEG;  Surgeon: Roby Lofts, MD;  Location: [facility];  Service: Orthopedics;  Laterality: Left;  . INTRAMEDULLARY (IM) NAIL INTERTROCHANTERIC Right 03/26/2019   Procedure: INTRAMEDULLARY (IM) NAIL INTERTROCHANTRIC;  Surgeon: Roby Lofts, MD;  Location: [facility];  Service: Orthopedics;  Laterality: Right;  . INTRAMEDULLARY (IM) NAIL INTERTROCHANTERIC Right 05/17/2019   Procedure: Intramedullary (Im) Nail Intertroch with circlage wiring;  Surgeon: Myrene Galas, MD;  Location: [facility] OR;  Service: Orthopedics;  Laterality: Right;  . NO PAST SURGERIES    . OPEN REDUCTION INTERNAL FIXATION (ORIF) TIBIA/FIBULA FRACTURE Left 11/08/2018   Procedure: OPEN REDUCTION INTERNAL FIXATION (ORIF) TIBIA/FIBULA FRACTURE;  Surgeon: Roby Lofts, MD;  Location: [facility];  Service: Orthopedics;  Laterality: Left;  . ORIF FEMUR FRACTURE Right 05/17/2019   Procedure: REMOVAL  OF HARDWARE;  Surgeon: Myrene Galas, MD;  Location: [facility];  Service: Orthopedics;  Laterality: Right;        Home Medications    Prior to Admission medications   Medication Sig Start Date End Date Taking? Authorizing Provider  amoxicillin-clavulanate (AUGMENTIN) 875-125 MG tablet Take 1 tablet by mouth 2 (two) times daily.   Yes [provider]  cholecalciferol (VITAMIN D3) 25 MCG (1000 UT) tablet Take 2 tablets (2,000 Units total) by mouth 2 (two) times daily. 05/19/19  Yes  [name], PA-C  Eliquis DVT/PE Starter Pack (ELIQUIS STARTER PACK) 5 MG TABS Take as directed on package: start with two-5mg  tablets twice daily for 7 days. On day 8, switch to one-5mg  tablet twice daily. 08/04/19  Yes Barb Merino, MD  guaiFENesin (MUCINEX) 600 MG 12 hr tablet Take 600 mg by mouth 2 (two) times daily as needed for cough or to loosen phlegm.   Yes [provider]  Multiple Vitamin (MULTIVITAMIN WITH MINERALS) TABS tablet Take 1 tablet by mouth daily. 05/20/19  Yes Aline August, MD  thiamine 100 MG tablet Take 1 tablet (100 mg total) by mouth daily. Patient not taking: Reported on 08/13/2019 05/19/19   Aline August, MD    Family History Family History  Problem Relation Age of Onset  . Diabetes Mellitus II Father   . Diabetes Mellitus II Other   . CAD Other     Social History Social History   Tobacco Use  . Smoking status: Current Every Day Smoker    Packs/day: 0.00    Types: Cigarettes  . Smokeless tobacco: Never Used  . Tobacco comment: About 1 cigarette per day or less  Substance Use Topics  . Alcohol use: Yes    Alcohol/week: 200.0 standard drinks    Types: 200 Cans of beer per week    Comment: 2-3 40oz beers per week  . Drug use: Yes    Types: Marijuana    Comment: 3 times a week     Allergies   Patient has no known allergies.   Review of Systems Review of Systems  Respiratory: Positive for shortness of breath.   All other systems reviewed and are negative.    Physical Exam Updated Vital Signs BP (!) 159/111 (BP Location: Right Arm)   Pulse (!) 127   Temp 98.3 F (36.8 C) (Oral)   Resp 16   Ht 6\' 6"  (1.981 m)   Wt 97.5 kg   SpO2 95%   BMI 24.85 kg/m   Physical Exam Vitals signs and nursing note reviewed.    43 year old male, resting comfortably and in no acute distress. Vital signs are significant for elevated heart rate and blood pressure. Oxygen saturation is 95%, which is normal. Head is normocephalic and  atraumatic. PERRLA, EOMI. Oropharynx is clear. Neck is nontender and supple without adenopathy or JVD. Back is nontender and there is no CVA tenderness. Lungs are clear without rales, wheezes, or rhonchi. Chest is nontender. Heart has regular rate and rhythm without murmur. Abdomen is soft, flat, nontender without masses or hepatosplenomegaly and peristalsis is hypoactive. Extremities have no cyanosis or edema, full range of motion is present. Skin is warm and dry without rash. Neurologic: Mental status is normal, cranial nerves are intact, there are no motor or sensory deficits.  ED Treatments / Results  Labs (all labs ordered are listed, but only abnormal results are displayed) Labs Reviewed  COMPREHENSIVE METABOLIC PANEL - Abnormal; Notable for the following components:      Result Value   Glucose, Bld 156 (*)  BUN <5 (*)    Calcium 8.3 (*)    Albumin 3.0 (*)    Alkaline Phosphatase 129 (*)    Total Bilirubin 0.2 (*)    Anion gap 16 (*)    All other components within normal limits  CBC WITH DIFFERENTIAL/PLATELET - Abnormal; Notable for the following components:   Hemoglobin 12.9 (*)    RDW 17.8 (*)    Platelets 735 (*)    All other components within normal limits  LACTIC ACID, PLASMA - Abnormal; Notable for the following components:   Lactic Acid, Venous 2.8 (*)    All other components within normal limits  ETHANOL - Abnormal; Notable for the following components:   Alcohol, Ethyl (B) 276 (*)    All other components within normal limits  SARS CORONAVIRUS 2 (TAT 6-24 HRS)  LIPASE, BLOOD  URINALYSIS, ROUTINE W REFLEX MICROSCOPIC  LACTIC ACID, PLASMA  RAPID URINE DRUG SCREEN, HOSP PERFORMED  TYPE AND SCREEN  ABO/RH    EKG EKG Interpretation  Date/Time:  Saturday August 13 2019 03:26:27 EDT Ventricular Rate:  121 PR Interval:    QRS Duration: 73 QT Interval:  315 QTC Calculation: 447 R Axis:   52 Text Interpretation:  Sinus tachycardia Probable anteroseptal  infarct, old Minimal ST depression, lateral leads Artifact in lead(s) I III aVL When compared with ECG of 08/01/2019, No significant change was found Confirmed by Dione BoozeGlick, Chalmer Zheng ([id number]) on 08/13/2019 3:33:15 AM   Radiology Dg Chest Port 1 View  Result Date: 08/13/2019 CLINICAL DATA:  Shortness of breath EXAM: PORTABLE CHEST 1 VIEW COMPARISON:  Chest radiograph 08/01/2019 Chest CTA 08/01/2019 FINDINGS: There is increased opacity at the right lung base. Left lung is clear. No pleural effusion or pneumothorax. Normal cardiomediastinal contours. IMPRESSION: Increased opacity at the right lung base, which may indicate pneumonia or pulmonary infarct in the setting of the recent pulmonary embolus. Electronically Signed   By: Deatra RobinsonKevin  Herman M.D.   On: 08/13/2019 05:36    Procedures Procedures  CRITICAL CARE Performed by: Dione Boozeavid Jessy Calixte Total critical care time: 55 minutes Critical care time was exclusive of separately billable procedures and treating other patients. Critical care was necessary to treat or prevent imminent or life-threatening deterioration. Critical care was time spent personally by me on the following activities: development of treatment plan with patient and/or surrogate as well as nursing, discussions with consultants, evaluation of patient's response to treatment, examination of patient, obtaining history from patient or surrogate, ordering and performing treatments and interventions, ordering and review of laboratory studies, ordering and review of radiographic studies, pulse oximetry and re-evaluation of patient's condition.  Medications Ordered in ED Medications  sodium chloride flush (NS) 0.9 % injection 3 mL (has no administration in time range)  sodium chloride 0.9 % bolus 1,000 mL (has no administration in time range)  ondansetron (ZOFRAN) injection 4 mg (has no administration in time range)     Initial Impression / Assessment and Plan / ED Course  I have reviewed the triage  vital signs and the nursing notes.  Pertinent labs & imaging results that were available during my care of the patient were reviewed by me and considered in my medical decision making (see chart for details).  Hematemesis inpatient who is anticoagulated.  Tachycardia is concerning for significant blood loss.  Will check screening labs and start on IV fluids.  Old records were reviewed confirming recent hospitalization for pulmonary embolism, questionable pneumonia as x-ray findings are probably related to pulmonary infarction and not pneumonia.  Chest x-ray shows sequelae of pulmonary infarction.  Hemoglobin is 12.9, which is actually 2.4 g higher than last recorded hemoglobin on September 30.  Lactic acid level has come back slightly elevated at 2.8.  This is felt to be related to GI bleed and not sepsis.  He is given IV fluids.  While in the ED, patient started vomiting blood again.  It was not a large amount, but in the setting of anticoagulation, he will clearly need to be admitted for observation.  Last dose of apixaban was approximately 12 hours ago.  Because he is not having brisk bleeding, it is felt that he does not need reversal with [facility].  Case is discussed with Dr. Debby Bud of [facility], who agrees to admit the patient.  Final Clinical Impressions(s) / ED Diagnoses   Final diagnoses:  Upper gastrointestinal bleeding  Anticoagulated    ED Discharge Orders    None       Dione Booze, MD 08/13/19 [id number]

## 2019-08-13 NOTE — ED Triage Notes (Signed)
Pt reporting vomiting, feeling like he is dehydrated, pain in the right posterior rib and shortness of breath. Pt recently admit for PE and PNA, taking abx and blood thinners as prescribed. Denies fevers.

## 2019-08-13 NOTE — Anesthesia Preprocedure Evaluation (Addendum)
Anesthesia Evaluation  Patient identified by MRN, date of birth, ID band Patient awake    Reviewed: Allergy & Precautions, H&P , NPO status , Patient's Chart, lab work & pertinent test results  Airway Mallampati: II  TM Distance: >3 FB Neck ROM: Full    Dental no notable dental hx. (+) Poor Dentition, Dental Advisory Given   Pulmonary neg pulmonary ROS, Current Smoker and Patient abstained from smoking.,    Pulmonary exam normal breath sounds clear to auscultation       Cardiovascular Exercise Tolerance: Good hypertension, negative cardio ROS   Rhythm:Regular Rate:Normal     Neuro/Psych Anxiety Depression negative neurological ROS     GI/Hepatic negative GI ROS, Neg liver ROS,   Endo/Other  diabetes  Renal/GU negative Renal ROS  negative genitourinary   Musculoskeletal  (+) Arthritis , Osteoarthritis,    Abdominal   Peds  Hematology negative hematology ROS (+)   Anesthesia Other Findings   Reproductive/Obstetrics negative OB ROS                            Anesthesia Physical Anesthesia Plan  ASA: II  Anesthesia Plan: MAC   Post-op Pain Management:    Induction: Intravenous  PONV Risk Score and Plan: 0 and Propofol infusion  Airway Management Planned: Nasal Cannula  Additional Equipment:   Intra-op Plan:   Post-operative Plan:   Informed Consent: I have reviewed the patients History and Physical, chart, labs and discussed the procedure including the risks, benefits and alternatives for the proposed anesthesia with the patient or authorized representative who has indicated his/her understanding and acceptance.     Dental advisory given  Plan Discussed with: CRNA  Anesthesia Plan Comments:         Anesthesia Quick Evaluation

## 2019-08-13 NOTE — Consult Note (Signed)
Referring Provider: Dr. Patel Primary Care Physician:  Edwards, Michelle P, NP Primary Gastroenterologist:  Unassigned  Reason for Consultation:  GI bleed  HPI: Richard Lopez is a 43 y.o. male with recent admission for pneumonia and history of alcohol abuse as well as anticoagulation with Eliquis for PE returns to the hospital due to the acute onset of red blood and dark brown vomitus. Denies black colored vomitus and he does not recall how many times he vomited but knows it was more than two. Has upper abdominal pain. No known history of peptic ulcer disease. Drinks 80 ounces of beer per day. Occasional NSAIDs but not able to tell me how often. Yesterday Hgb 12.9 (10.5 on 08/03/19). Hgb 10.4 today. Had hematemesis in ER described as "venous blood" by nursing but patient does not know what it looked like. COVID negative.  Past Medical History:  Diagnosis Date  . Anxiety   . 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 Depression   . Diabetes mellitus    diet controlled  . Essential hypertension   . Gastritis   . Gout   . Pulmonary embolism (HCC)     Past Surgical History:  Procedure Laterality Date  . EXTERNAL FIXATION LEG Left 11/07/2018   Procedure: EXTERNAL FIXATION LEFT LOWER LEG;  Surgeon: Swinteck, Brian, MD;  Location: [facility];  Service: Orthopedics;  Laterality: Left;  . EXTERNAL FIXATION REMOVAL Left 11/08/2018   Procedure: REMOVAL EXTERNAL FIXATION LEG;  Surgeon: Haddix, Kevin P, MD;  Location: [facility];  Service: Orthopedics;  Laterality: Left;  . INTRAMEDULLARY (IM) NAIL INTERTROCHANTERIC Right 03/26/2019   Procedure: INTRAMEDULLARY (IM) NAIL INTERTROCHANTRIC;  Surgeon: Haddix, Kevin P, MD;  Location: [facility];  Service: Orthopedics;  Laterality: Right;  . INTRAMEDULLARY (IM) NAIL INTERTROCHANTERIC Right 05/17/2019   Procedure: Intramedullary (Im) Nail Intertroch with circlage wiring;  Surgeon: Handy, Michael, MD;  Location: [facility];  Service: Orthopedics;  Laterality: Right;  . NO PAST  SURGERIES    . OPEN REDUCTION INTERNAL FIXATION (ORIF) TIBIA/FIBULA FRACTURE Left 11/08/2018   Procedure: OPEN REDUCTION INTERNAL FIXATION (ORIF) TIBIA/FIBULA FRACTURE;  Surgeon: Haddix, Kevin P, MD;  Location: [facility];  Service: Orthopedics;  Laterality: Left;  . ORIF FEMUR FRACTURE Right 05/17/2019   Procedure: REMOVAL  OF HARDWARE;  Surgeon: Handy, Michael, MD;  Location: [facility];  Service: Orthopedics;  Laterality: Right;    Prior to Admission medications   Medication Sig Start Date End Date Taking? Authorizing Provider  amoxicillin-clavulanate (AUGMENTIN) 875-125 MG tablet Take 1 tablet by mouth 2 (two) times daily.   Yes [provider]  cholecalciferol (VITAMIN D3) 25 MCG (1000 UT) tablet Take 2 tablets (2,000 Units total) by mouth 2 (two) times daily. 05/19/19  Yes Paul, Keith, PA-C  Eliquis DVT/PE Starter Pack (ELIQUIS STARTER PACK) 5 MG TABS Take as directed on package: start with two-5mg tablets twice daily for 7 days. On day 8, switch to one-5mg tablet twice daily. 08/04/19  Yes Ghimire, Kuber, MD  guaiFENesin (MUCINEX) 600 MG 12 hr tablet Take 600 mg by mouth 2 (two) times daily as needed for cough or to loosen phlegm.   Yes [provider]  Multiple Vitamin (MULTIVITAMIN WITH MINERALS) TABS tablet Take 1 tablet by mouth daily. 05/20/19  Yes Alekh, Kshitiz, MD  thiamine 100 MG tablet Take 1 tablet (100 mg total) by mouth daily. Patient not taking: Reported on 08/13/2019 05/19/19   Alekh, Kshitiz, MD    Scheduled Meds: . folic acid  1   mg Oral Daily  . LORazepam  0-4 mg Intravenous Q4H   Followed by  . [START ON 08/15/2019] LORazepam  0-4 mg Intravenous Q8H  . multivitamin with minerals  1 tablet Oral Daily  . [START ON 08/16/2019] pantoprazole  40 mg Intravenous Q12H  . sodium chloride flush  3 mL Intravenous Q12H  . thiamine  100 mg Oral Daily   Or  . thiamine  100 mg Intravenous Daily   Continuous Infusions: . lactated ringers 75 mL/hr at 08/13/19 0911  .  pantoprozole (PROTONIX) infusion 8 mg/hr (08/13/19 0721)   PRN Meds:.acetaminophen **OR** acetaminophen, LORazepam **OR** LORazepam, morphine injection, ondansetron **OR** ondansetron (ZOFRAN) IV  Allergies as of 08/13/2019  . (No Known Allergies)    Family History  Problem Relation Age of Onset  . Diabetes Mellitus II Father   . Diabetes Mellitus II Other   . CAD Other     Social History   Socioeconomic History  . Marital status: Single    Spouse name: Not on file  . Number of children: Not on file  . Years of education: Not on file  . Highest education level: Some college, no degree  Occupational History  . Not on file  Social Needs  . Financial resource strain: Somewhat hard  . Food insecurity    Worry: Patient refused    Inability: Patient refused  . Transportation needs    Medical: Patient refused    Non-medical: Patient refused  Tobacco Use  . Smoking status: Current Every Day Smoker    Packs/day: 0.00    Types: Cigarettes  . Smokeless tobacco: Never Used  . Tobacco comment: About 1 cigarette per day or less  Substance and Sexual Activity  . Alcohol use: Yes    Alcohol/week: 200.0 standard drinks    Types: 200 Cans of beer per week    Comment: 2-3 40oz beers per week  . Drug use: Yes    Types: Marijuana    Comment: 3 times a week  . Sexual activity: Yes  Lifestyle  . Physical activity    Days per week: Patient refused    Minutes per session: Patient refused  . Stress: Rather much  Relationships  . Social Herbalist on phone: Patient refused    Gets together: Patient refused    Attends religious service: Patient refused    Active member of club or organization: Patient refused    Attends meetings of clubs or organizations: Patient refused    Relationship status: Patient refused  . Intimate partner violence    Fear of current or ex partner: Patient refused    Emotionally abused: Patient refused    Physically abused: Patient refused     Forced sexual activity: Patient refused  Other Topics Concern  . Not on file  Social History Narrative  . Not on file    Review of Systems: All negative except as stated above in HPI.  Physical Exam: Vital signs: Vitals:   08/13/19 1100 08/13/19 1328  BP: (!) 167/106 (!) 147/88  Pulse:  96  Resp: 15 18  Temp:    SpO2:  100%  T 98.3   General:   Disheveled, lethargic, thin, mild acute distress Head: normocephalic, atraumatic Eyes: anicteric sclera ENT: oropharynx clear, poor dentition Neck: supple, nontender Lungs:  Clear throughout to auscultation.   No wheezes, crackles, or rhonchi. No acute distress. Heart:  Regular rate and rhythm; no murmurs, clicks, rubs,  or gallops. Abdomen: soft, nontender, nondistended, +BS  Rectal:  Deferred Ext: no edema  GI:  Lab Results: Recent Labs    08/13/19 0435 08/13/19 1121  WBC 8.7 8.5  HGB 12.9* 10.8*  HCT 39.3 33.4*  PLT 735* 607*   BMET Recent Labs    08/13/19 0435  NA 141  K 3.5  CL 101  CO2 24  GLUCOSE 156*  BUN <5*  CREATININE 0.64  CALCIUM 8.3*   LFT Recent Labs    08/13/19 0435  PROT 7.9  ALBUMIN 3.0*  AST 23  ALT 13  ALKPHOS 129*  BILITOT 0.2*   PT/INR Recent Labs    08/13/19 1121  LABPROT 15.3*  INR 1.2     Studies/Results: Dg Chest Port 1 View  Result Date: 08/13/2019 CLINICAL DATA:  Shortness of breath EXAM: PORTABLE CHEST 1 VIEW COMPARISON:  Chest radiograph 08/01/2019 Chest CTA 08/01/2019 FINDINGS: There is increased opacity at the right lung base. Left lung is clear. No pleural effusion or pneumothorax. Normal cardiomediastinal contours. IMPRESSION: Increased opacity at the right lung base, which may indicate pneumonia or pulmonary infarct in the setting of the recent pulmonary embolus. Electronically Signed   By: Deatra Robinson M.D.   On: 08/13/2019 05:36    Impression/Plan: Hematemesis in the setting of Eliquis. Likely due to esophagitis but peptic ulcer disease also high on  differential. Continue Protonix drip. Clear liquid diet ok. NPO p MN. EGD tomorrow morning to further evaluate. Will follow.    LOS: 0 days   Shirley Friar  08/13/2019, 2:47 PM  Questions please call [phone]

## 2019-08-13 NOTE — H&P (View-Only) (Signed)
Referring Provider: Dr. Allena Katz Primary Care Physician:  [name], NP Primary Gastroenterologist:  Gentry Fitz  Reason for Consultation:  GI bleed  HPI: Richard Lopez is a 43 y.o. male with recent admission for pneumonia and history of alcohol abuse as well as anticoagulation with Eliquis for PE returns to the hospital due to the acute onset of red blood and dark brown vomitus. Denies black colored vomitus and he does not recall how many times he vomited but knows it was more than two. Has upper abdominal pain. No known history of peptic ulcer disease. Drinks 80 ounces of beer per day. Occasional NSAIDs but not able to tell me how often. Yesterday Hgb 12.9 (10.5 on 08/03/19). Hgb 10.4 today. Had hematemesis in ER described as "venous blood" by nursing but patient does not know what it looked like. COVID negative.  Past Medical History:  Diagnosis Date  . Anxiety   . 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 Depression   . Diabetes mellitus    diet controlled  . Essential hypertension   . Gastritis   . Gout   . Pulmonary embolism [facility])     Past Surgical History:  Procedure Laterality Date  . EXTERNAL FIXATION LEG Left 11/07/2018   Procedure: EXTERNAL FIXATION LEFT LOWER LEG;  Surgeon: Samson Frederic, MD;  Location: [facility];  Service: Orthopedics;  Laterality: Left;  . EXTERNAL FIXATION REMOVAL Left 11/08/2018   Procedure: REMOVAL EXTERNAL FIXATION LEG;  Surgeon: Roby Lofts, MD;  Location: [facility];  Service: Orthopedics;  Laterality: Left;  . INTRAMEDULLARY (IM) NAIL INTERTROCHANTERIC Right 03/26/2019   Procedure: INTRAMEDULLARY (IM) NAIL INTERTROCHANTRIC;  Surgeon: Roby Lofts, MD;  Location: [facility];  Service: Orthopedics;  Laterality: Right;  . INTRAMEDULLARY (IM) NAIL INTERTROCHANTERIC Right 05/17/2019   Procedure: Intramedullary (Im) Nail Intertroch with circlage wiring;  Surgeon: Myrene Galas, MD;  Location: [facility];  Service: Orthopedics;  Laterality: Right;  . NO PAST  SURGERIES    . OPEN REDUCTION INTERNAL FIXATION (ORIF) TIBIA/FIBULA FRACTURE Left 11/08/2018   Procedure: OPEN REDUCTION INTERNAL FIXATION (ORIF) TIBIA/FIBULA FRACTURE;  Surgeon: Roby Lofts, MD;  Location: [facility];  Service: Orthopedics;  Laterality: Left;  . ORIF FEMUR FRACTURE Right 05/17/2019   Procedure: REMOVAL  OF HARDWARE;  Surgeon: Myrene Galas, MD;  Location: [facility];  Service: Orthopedics;  Laterality: Right;    Prior to Admission medications   Medication Sig Start Date End Date Taking? Authorizing Provider  amoxicillin-clavulanate (AUGMENTIN) 875-125 MG tablet Take 1 tablet by mouth 2 (two) times daily.   Yes [provider]  cholecalciferol (VITAMIN D3) 25 MCG (1000 UT) tablet Take 2 tablets (2,000 Units total) by mouth 2 (two) times daily. 05/19/19  Yes Montez Morita, PA-C  Eliquis DVT/PE Starter Pack (ELIQUIS STARTER PACK) 5 MG TABS Take as directed on package: start with two-5mg  tablets twice daily for 7 days. On day 8, switch to one-5mg  tablet twice daily. 08/04/19  Yes Dorcas Carrow, MD  guaiFENesin (MUCINEX) 600 MG 12 hr tablet Take 600 mg by mouth 2 (two) times daily as needed for cough or to loosen phlegm.   Yes [provider]  Multiple Vitamin (MULTIVITAMIN WITH MINERALS) TABS tablet Take 1 tablet by mouth daily. 05/20/19  Yes Glade Lloyd, MD  thiamine 100 MG tablet Take 1 tablet (100 mg total) by mouth daily. Patient not taking: Reported on 08/13/2019 05/19/19   Glade Lloyd, MD    Scheduled Meds: . folic acid  1  mg Oral Daily  . LORazepam  0-4 mg Intravenous Q4H   Followed by  . [START ON 08/15/2019] LORazepam  0-4 mg Intravenous Q8H  . multivitamin with minerals  1 tablet Oral Daily  . [START ON 08/16/2019] pantoprazole  40 mg Intravenous Q12H  . sodium chloride flush  3 mL Intravenous Q12H  . thiamine  100 mg Oral Daily   Or  . thiamine  100 mg Intravenous Daily   Continuous Infusions: . lactated ringers 75 mL/hr at 08/13/19 0911  .  pantoprozole (PROTONIX) infusion 8 mg/hr (08/13/19 0721)   PRN Meds:.acetaminophen **OR** acetaminophen, LORazepam **OR** LORazepam, morphine injection, ondansetron **OR** ondansetron (ZOFRAN) IV  Allergies as of 08/13/2019  . (No Known Allergies)    Family History  Problem Relation Age of Onset  . Diabetes Mellitus II Father   . Diabetes Mellitus II Other   . CAD Other     Social History   Socioeconomic History  . Marital status: Single    Spouse name: Not on file  . Number of children: Not on file  . Years of education: Not on file  . Highest education level: Some college, no degree  Occupational History  . Not on file  Social Needs  . Financial resource strain: Somewhat hard  . Food insecurity    Worry: Patient refused    Inability: Patient refused  . Transportation needs    Medical: Patient refused    Non-medical: Patient refused  Tobacco Use  . Smoking status: Current Every Day Smoker    Packs/day: 0.00    Types: Cigarettes  . Smokeless tobacco: Never Used  . Tobacco comment: About 1 cigarette per day or less  Substance and Sexual Activity  . Alcohol use: Yes    Alcohol/week: 200.0 standard drinks    Types: 200 Cans of beer per week    Comment: 2-3 40oz beers per week  . Drug use: Yes    Types: Marijuana    Comment: 3 times a week  . Sexual activity: Yes  Lifestyle  . Physical activity    Days per week: Patient refused    Minutes per session: Patient refused  . Stress: Rather much  Relationships  . Social Herbalist on phone: Patient refused    Gets together: Patient refused    Attends religious service: Patient refused    Active member of club or organization: Patient refused    Attends meetings of clubs or organizations: Patient refused    Relationship status: Patient refused  . Intimate partner violence    Fear of current or ex partner: Patient refused    Emotionally abused: Patient refused    Physically abused: Patient refused     Forced sexual activity: Patient refused  Other Topics Concern  . Not on file  Social History Narrative  . Not on file    Review of Systems: All negative except as stated above in HPI.  Physical Exam: Vital signs: Vitals:   08/13/19 1100 08/13/19 1328  BP: (!) 167/106 (!) 147/88  Pulse:  96  Resp: 15 18  Temp:    SpO2:  100%  T 98.3   General:   Disheveled, lethargic, thin, mild acute distress Head: normocephalic, atraumatic Eyes: anicteric sclera ENT: oropharynx clear, poor dentition Neck: supple, nontender Lungs:  Clear throughout to auscultation.   No wheezes, crackles, or rhonchi. No acute distress. Heart:  Regular rate and rhythm; no murmurs, clicks, rubs,  or gallops. Abdomen: soft, nontender, nondistended, +BS  Rectal:  Deferred Ext: no edema  GI:  Lab Results: Recent Labs    08/13/19 0435 08/13/19 1121  WBC 8.7 8.5  HGB 12.9* 10.8*  HCT 39.3 33.4*  PLT 735* 607*   BMET Recent Labs    08/13/19 0435  NA 141  K 3.5  CL 101  CO2 24  GLUCOSE 156*  BUN <5*  CREATININE 0.64  CALCIUM 8.3*   LFT Recent Labs    08/13/19 0435  PROT 7.9  ALBUMIN 3.0*  AST 23  ALT 13  ALKPHOS 129*  BILITOT 0.2*   PT/INR Recent Labs    08/13/19 1121  LABPROT 15.3*  INR 1.2     Studies/Results: Dg Chest Port 1 View  Result Date: 08/13/2019 CLINICAL DATA:  Shortness of breath EXAM: PORTABLE CHEST 1 VIEW COMPARISON:  Chest radiograph 08/01/2019 Chest CTA 08/01/2019 FINDINGS: There is increased opacity at the right lung base. Left lung is clear. No pleural effusion or pneumothorax. Normal cardiomediastinal contours. IMPRESSION: Increased opacity at the right lung base, which may indicate pneumonia or pulmonary infarct in the setting of the recent pulmonary embolus. Electronically Signed   By: Deatra Robinson M.D.   On: 08/13/2019 05:36    Impression/Plan: Hematemesis in the setting of Eliquis. Likely due to esophagitis but peptic ulcer disease also high on  differential. Continue Protonix drip. Clear liquid diet ok. NPO p MN. EGD tomorrow morning to further evaluate. Will follow.    LOS: 0 days   Shirley Friar  08/13/2019, 2:47 PM  Questions please call [phone]

## 2019-08-13 NOTE — ED Notes (Signed)
He is unhappy to be n.p.o.; and I explain to him that he needs to be so at least until he ceases to have bloody emeses. (He had had a bloody emesis at ~0645 which is as venous blood in appearance and ~100 ml in volume). He is in no distress and tells he his breathing is "fine".

## 2019-08-14 ENCOUNTER — Observation Stay (HOSPITAL_COMMUNITY): Payer: Self-pay | Admitting: Anesthesiology

## 2019-08-14 ENCOUNTER — Encounter (HOSPITAL_COMMUNITY)
Admission: EM | Disposition: A | Discharge status: Home / Self Care | Payer: Self-pay | Source: Home / Self Care | Attending: Internal Medicine

## 2019-08-14 ENCOUNTER — Encounter (HOSPITAL_COMMUNITY): Payer: Self-pay

## 2019-08-14 DIAGNOSIS — K922 Gastrointestinal hemorrhage, unspecified: Secondary | ICD-10-CM | POA: Diagnosis present

## 2019-08-14 HISTORY — PX: ESOPHAGOGASTRODUODENOSCOPY (EGD) WITH PROPOFOL: SHX5813

## 2019-08-14 LAB — CBC
HCT: 31.5 % — ABNORMAL LOW (ref 39.0–52.0)
HCT: 36.8 % — ABNORMAL LOW (ref 39.0–52.0)
Hemoglobin: 10 g/dL — ABNORMAL LOW (ref 13.0–17.0)
Hemoglobin: 11.8 g/dL — ABNORMAL LOW (ref 13.0–17.0)
MCH: 27 pg (ref 26.0–34.0)
MCH: 27.3 pg (ref 26.0–34.0)
MCHC: 31.7 g/dL (ref 30.0–36.0)
MCHC: 32.1 g/dL (ref 30.0–36.0)
MCV: 85.1 fL (ref 80.0–100.0)
MCV: 85.2 fL (ref 80.0–100.0)
Platelets: 534 10*3/uL — ABNORMAL HIGH (ref 150–400)
Platelets: 534 10*3/uL — ABNORMAL HIGH (ref 150–400)
RBC: 3.7 MIL/uL — ABNORMAL LOW (ref 4.22–5.81)
RBC: 4.32 MIL/uL (ref 4.22–5.81)
RDW: 18 % — ABNORMAL HIGH (ref 11.5–15.5)
RDW: 18.2 % — ABNORMAL HIGH (ref 11.5–15.5)
WBC: 9 10*3/uL (ref 4.0–10.5)
WBC: 7.9 10*3/uL (ref 4.0–10.5)
nRBC: 0 % (ref 0.0–0.2)
nRBC: 0 % (ref 0.0–0.2)

## 2019-08-14 LAB — COMPREHENSIVE METABOLIC PANEL
ALT: 8 U/L (ref 0–44)
AST: 27 U/L (ref 15–41)
Albumin: 2.3 g/dL — ABNORMAL LOW (ref 3.5–5.0)
Alkaline Phosphatase: 91 U/L (ref 38–126)
Anion gap: 10 (ref 5–15)
BUN: 6 mg/dL (ref 6–20)
CO2: 27 mmol/L (ref 22–32)
Calcium: 7.6 mg/dL — ABNORMAL LOW (ref 8.9–10.3)
Chloride: 102 mmol/L (ref 98–111)
Creatinine, Ser: 0.64 mg/dL (ref 0.61–1.24)
GFR calc Af Amer: >60 mL/min (ref >60)
GFR calc non Af Amer: >60 mL/min (ref >60)
Glucose, Bld: 91 mg/dL (ref 70–99)
Potassium: 3.9 mmol/L (ref 3.5–5.1)
Sodium: 139 mmol/L (ref 135–145)
Total Bilirubin: 1 mg/dL (ref 0.3–1.2)
Total Protein: 6 g/dL — ABNORMAL LOW (ref 6.5–8.1)

## 2019-08-14 LAB — GLUCOSE, CAPILLARY
Glucose-Capillary: 82 mg/dL (ref 70–99)
Glucose-Capillary: 90 mg/dL (ref 70–99)

## 2019-08-14 LAB — MAGNESIUM: Magnesium: 1.3 mg/dL — ABNORMAL LOW (ref 1.7–2.4)

## 2019-08-14 SURGERY — ESOPHAGOGASTRODUODENOSCOPY (EGD) WITH PROPOFOL
Anesthesia: Monitor Anesthesia Care

## 2019-08-14 MED ORDER — PROPOFOL 10 MG/ML IV BOLUS
INTRAVENOUS | Status: DC | PRN
  Administered 2019-08-14 (×3): 20 mg via INTRAVENOUS

## 2019-08-14 MED ORDER — PROPOFOL 500 MG/50ML IV EMUL
INTRAVENOUS | Status: DC | PRN
  Administered 2019-08-14: 75 ug/kg/min via INTRAVENOUS

## 2019-08-14 MED ORDER — SODIUM CHLORIDE 0.9 % IV SOLN: INTRAVENOUS | Status: DC

## 2019-08-14 MED ORDER — PROPOFOL 10 MG/ML IV BOLUS
INTRAVENOUS | Status: AC
  Filled 2019-08-14: qty 20

## 2019-08-14 MED ORDER — POTASSIUM CHLORIDE CRYS ER 20 MEQ PO TBCR
20.0000 meq | EXTENDED_RELEASE_TABLET | Freq: Once | ORAL | Status: AC
  Administered 2019-08-14: 20 meq via ORAL
  Filled 2019-08-14: qty 1

## 2019-08-14 SURGICAL SUPPLY — 15 items

## 2019-08-14 NOTE — Progress Notes (Signed)
[facility]                                                                              Patient Demographics  Richard Lopez, is a 43 y.o. male, DOB - 07/05/1976, PFX:[id number]  Admit date - 08/13/2019   Admitting Physician Rolly Salter, MD  Outpatient Primary MD for the patient is [name], NP  Outpatient specialists:   LOS - 0  days   Medical records reviewed and are as summarized below:    Chief Complaint  Patient presents with   Shortness of Breath       Brief summary   Patient is a 43 year old male with history of alcohol abuse, recurrent falls with multiple fractures, diet-controlled diabetes, hypertension, marijuana use, recent admission for pneumonia, PE on anticoagulation, was discharged on 10/2 on Eliquis.  Patient presented with hematemesis, heavy alcohol drinker, drank 40 ounces of beer on the day of admission.  After that he started having episodes of nausea, mild abdominal pain in the epigastric region and then an episode of vomiting with bright red blood.  Subsequently patient had few more episodes of hematemesis.  Denied any hematochezia or melena. 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 -Hold Eliquis in the setting of active bleeding, will follow GI recommendations and also discussed with the patient regarding his alcohol use once he is more alert and oriented   History of alcohol abuse -Currently somnolent, was placed on CIWA protocol scheduled and as  needed Ativan -Not in any acute withdrawals, will hold the scheduled Ativan and monitor closely -High risk of withdrawals given daily and heavy alcohol use  Recent aspiration pneumonia -Patient was treated with IV Unasyn and oral Augmentin on discharge, completed on 08/12/2019   Diet-controlled diabetes mellitus -CBGs currently stable, continue to monitor  Code Status: Full code DVT Prophylaxis: SCDs Family Communication:  No family at the bedside   Disposition Plan:  high risk for alcohol withdrawals and rebleeding, has moderate erosive ulcerative esophagitis, will continue PPI drip today.  Change to inpatient, remains inpatient.  Time Spent in minutes   35 minutes  Procedures:  EGD  Consultants:   Gastroenterology  Antimicrobials:   Anti-infectives (From admission, onward)   None          Medications  Scheduled Meds:  Chlorhexidine Gluconate Cloth  6 each Topical Daily   folic acid  1 mg Oral Daily   LORazepam  0-4 mg Intravenous Q4H   Followed by   Melene Muller ON 08/15/2019] LORazepam  0-4 mg Intravenous Q8H   multivitamin with minerals  1 tablet Oral Daily   [START ON 08/16/2019] pantoprazole  40 mg Intravenous Q12H   sodium chloride flush  3  mL Intravenous Q12H   thiamine  100 mg Oral Daily   Or   thiamine  100 mg Intravenous Daily   Continuous Infusions:  lactated ringers 75 mL/hr at 08/13/19 2303   pantoprozole (PROTONIX) infusion 8 mg/hr (08/14/19 0612)   PRN Meds:.acetaminophen **OR** acetaminophen, LORazepam **OR** LORazepam, morphine injection, ondansetron **OR** ondansetron (ZOFRAN) IV      Subjective:   Richard Lopez was seen and examined today.  Somnolent, difficult to obtain review of system from the patient, also had just returned from endoscopy.  No acute events overnight.  No fevers or chills.  Had received Ativan this morning.  Objective:   Vitals:   08/14/19 0830 08/14/19 0840 08/14/19 0905 08/14/19 0909  BP: (!) 131/91  134/84    Pulse: 100 100 91   Resp: 20 20    Temp:    97.9 F (36.6 C)  TempSrc:    Axillary  SpO2: 100% 100%    Weight:      Height:        Intake/Output Summary (Last 24 hours) at 08/14/2019 0957 Last data filed at 08/14/2019 9292 Gross per 24 hour  Intake 1300 ml  Output --  Net 1300 ml     Wt Readings from Last 3 Encounters:  08/14/19 96.1 kg  08/01/19 99.8 kg  05/18/19 95.3 kg     Exam  General: Somnolent, but arousable and follows commands NAD  Eyes:   HEENT:  Atraumatic, normocephalic, normal oropharynx  Cardiovascular: S1 S2 auscultated, no murmurs, RRR  Respiratory: Clear to auscultation bilaterally, no wheezing, rales or rhonchi  Gastrointestinal: Soft, mild epigastric TTP, NBS  Ext: no pedal edema bilaterally  Neuro: Was able to lift up both lower extremities  Musculoskeletal: No digital cyanosis, clubbing  Skin: No rashes  Psych: Somnolent, NAD   Data Reviewed:  I have personally reviewed following labs and imaging studies  Micro Results Recent Results (from the past 240 hour(s))  SARS Coronavirus 2 by RT PCR (hospital order, performed in [facility] hospital lab)     Status: None   Collection Time: 08/13/19  6:18 AM  Result Value Ref Range Status   SARS Coronavirus 2 NEGATIVE NEGATIVE Final    Comment: (NOTE) If result is NEGATIVE SARS-CoV-2 target nucleic acids are NOT DETECTED. The SARS-CoV-2 RNA is generally detectable in upper and lower  respiratory specimens during the acute phase of infection. The lowest  concentration of SARS-CoV-2 viral copies this assay can detect is 250  copies / mL. A negative result does not preclude SARS-CoV-2 infection  and should not be used as the sole basis for treatment or other  patient management decisions.  A negative result may occur with  improper specimen collection / handling, submission of specimen other  than nasopharyngeal swab, presence of viral mutation(s) within the  areas targeted by  this assay, and inadequate number of viral copies  (<250 copies / mL). A negative result must be combined with clinical  observations, patient history, and epidemiological information. If result is POSITIVE SARS-CoV-2 target nucleic acids are DETECTED. The SARS-CoV-2 RNA is generally detectable in upper and lower  respiratory specimens dur ing the acute phase of infection.  Positive  results are indicative of active infection with SARS-CoV-2.  Clinical  correlation with patient history and other diagnostic information is  necessary to determine patient infection status.  Positive results do  not rule out bacterial infection or co-infection with other viruses. If result is PRESUMPTIVE POSTIVE SARS-CoV-2 nucleic acids MAY BE  PRESENT.   A presumptive positive result was obtained on the submitted specimen  and confirmed on repeat testing.  While 2019 novel coronavirus  (SARS-CoV-2) nucleic acids may be present in the submitted sample  additional confirmatory testing may be necessary for epidemiological  and / or clinical management purposes  to differentiate between  SARS-CoV-2 and other Sarbecovirus currently known to infect humans.  If clinically indicated additional testing with an alternate test  methodology [phone]) is advised. The SARS-CoV-2 RNA is generally  detectable in upper and lower respiratory sp ecimens during the acute  phase of infection. The expected result is Negative. Fact Sheet for Patients:  StrictlyIdeas.no Fact Sheet for Healthcare Providers: BankingDealers.co.za This test is not yet approved or cleared by the [place] FDA and has been authorized for detection and/or diagnosis of SARS-CoV-2 by FDA under an Emergency Use Authorization (EUA).  This EUA will remain in effect (meaning this test can be used) for the duration of the COVID-19 declaration under Section 564(b)(1) of the Act, 21 U.S.C. section  360bbb-3(b)(1), unless the authorization is terminated or revoked sooner. Performed at [facility], [place] [address], [place], [place] [address]     Radiology Reports Dg Chest 2 View  Result Date: 08/01/2019 CLINICAL DATA:  Shortness of breath. Right-sided chest pain. EXAM: CHEST - 2 VIEW COMPARISON:  05/16/2019 FINDINGS: Normal heart size and mediastinal contours. Mild patchy opacity in the right lung base and infrahilar lung. Left lung is clear. No pulmonary edema. No definite pleural effusion. No pneumothorax. No acute osseous abnormalities are seen. IMPRESSION: Mild patchy opacity in the right lung base, may reflect atelectasis or pneumonia. Electronically Signed   By: Keith Rake M.D.   On: 08/01/2019 02:20   Ct Angio Chest Pe W And/or Wo Contrast  Result Date: 08/01/2019 CLINICAL DATA:  Shortness of breath and chest pain.  Fever. EXAM: CT ANGIOGRAPHY CHEST WITH CONTRAST TECHNIQUE: Multidetector CT imaging of the chest was performed using the standard protocol during bolus administration of intravenous contrast. Multiplanar CT image reconstructions and MIPs were obtained to evaluate the vascular anatomy. CONTRAST:  133mL OMNIPAQUE IOHEXOL 350 MG/ML SOLN COMPARISON:  Chest radiograph August 01, 2019 FINDINGS: Cardiovascular: There are multiple pulmonary emboli involving multiple lower lobe pulmonary artery branches bilaterally. There is a slightly greater degree of pulmonary embolic change in the right lower lobe and left lower lobe. There is an incompletely obstructing pulmonary embolus in the posterior segment right upper lobe pulmonary artery branch. No central pulmonary embolus is evident. There is no right heart strain; the right ventricle to left ventricle diameter ratio is less than 0.9. There is no demonstrable thoracic aortic aneurysm or dissection. The visualized great vessels appear unremarkable. There are multiple foci of coronary artery calcification. There is  no appreciable pericardial effusion or pericardial thickening. Mediastinum/Nodes: Thyroid appears normal. There is a 1.5 x 1.3 cm subcarinal lymph node. There is a right hilar lymph node measuring 1.3 x 1.1 cm. No esophageal lesions are evident. Lungs/Pleura: There is a fairly small right pleural effusion. There is airspace consolidation consistent with pneumonia involving portions of the superior and lateral segments of the right lower lobe. There is a cavitary lesion in the posterior segment of the right lower lobe measuring 3.8 x 2.6 cm. The lungs elsewhere are clear. Upper Abdomen: Visualized upper abdominal structures appear normal. Musculoskeletal: There is degenerative change in the thoracic spine. No blastic or lytic bone lesions are evident. There are no chest wall lesions. Review of  the MIP images confirms the above findings. IMPRESSION: 1.  Multiple pulmonary emboli.  No right heart strain. 2. Cavitary lesion in the posterior segment right lower lobe. Question cavitary pneumonia versus cavitary neoplasm. 3. Fairly small right pleural effusion. Airspace consolidation consistent with pneumonia involving portions of the superior and lateral segments of the right lower lobe. 4. Prominent right hilar and subcarinal lymph nodes. Neoplastic etiology for this adenopathy cannot be excluded. 5.  Multiple foci of coronary artery calcification. Critical Value/emergent results were called by telephone at the time of interpretation on 08/01/2019 at 12:03 pm to providerWYLDER FONDAW , who verbally acknowledged these results. Electronically Signed   By: Bretta Bang III M.D.   On: 08/01/2019 12:05   Dg Chest Port 1 View  Result Date: 08/13/2019 CLINICAL DATA:  Shortness of breath EXAM: PORTABLE CHEST 1 VIEW COMPARISON:  Chest radiograph 08/01/2019 Chest CTA 08/01/2019 FINDINGS: There is increased opacity at the right lung base. Left lung is clear. No pleural effusion or pneumothorax. Normal cardiomediastinal  contours. IMPRESSION: Increased opacity at the right lung base, which may indicate pneumonia or pulmonary infarct in the setting of the recent pulmonary embolus. Electronically Signed   By: Deatra Robinson M.D.   On: 08/13/2019 05:36   Vas [place] Lower Extremity Venous (dvt)  Result Date: 08/01/2019  Lower Venous Study Indications: Pain, and Swelling.  Risk Factors: None identified. Anticoagulation: Heparin. Comparison Study: No prior studies. Performing Technologist: Chanda Busing RVT  Examination Guidelines: A complete evaluation includes B-mode imaging, spectral Doppler, color Doppler, and power Doppler as needed of all accessible portions of each vessel. Bilateral testing is considered an integral part of a complete examination. Limited examinations for reoccurring indications may be performed as noted.  +---------+---------------+---------+-----------+----------+--------------+  RIGHT     Compressibility Phasicity Spontaneity Properties Thrombus Aging  +---------+---------------+---------+-----------+----------+--------------+  CFV       Full            Yes       Yes                                    +---------+---------------+---------+-----------+----------+--------------+  SFJ       Full                                                             +---------+---------------+---------+-----------+----------+--------------+  FV Prox   Full                                                             +---------+---------------+---------+-----------+----------+--------------+  FV Mid    Full                                                             +---------+---------------+---------+-----------+----------+--------------+  FV Distal Full                                                             +---------+---------------+---------+-----------+----------+--------------+  PFV       Full                                                              +---------+---------------+---------+-----------+----------+--------------+  POP       Partial         No        No                     Acute           +---------+---------------+---------+-----------+----------+--------------+  PTV       None                                             Acute           +---------+---------------+---------+-----------+----------+--------------+  PERO      Full                                                             +---------+---------------+---------+-----------+----------+--------------+  Gastroc   Partial                                          Acute           +---------+---------------+---------+-----------+----------+--------------+   +---------+---------------+---------+-----------+----------+--------------+  LEFT      Compressibility Phasicity Spontaneity Properties Thrombus Aging  +---------+---------------+---------+-----------+----------+--------------+  CFV       Full            Yes       Yes                                    +---------+---------------+---------+-----------+----------+--------------+  SFJ       Full                                                             +---------+---------------+---------+-----------+----------+--------------+  FV Prox   Full                                                             +---------+---------------+---------+-----------+----------+--------------+  FV Mid    Full                                                             +---------+---------------+---------+-----------+----------+--------------+  FV Distal Full                                                             +---------+---------------+---------+-----------+----------+--------------+  PFV       Full                                                             +---------+---------------+---------+-----------+----------+--------------+  POP       Full            Yes       Yes                                     +---------+---------------+---------+-----------+----------+--------------+  PTV       Full                                                             +---------+---------------+---------+-----------+----------+--------------+  PERO      Full                                                             +---------+---------------+---------+-----------+----------+--------------+     Summary: Right: Findings consistent with acute deep vein thrombosis involving the right popliteal vein, right posterior tibial veins, and right gastrocnemius veins. No cystic structure found in the popliteal fossa. Left: There is no evidence of deep vein thrombosis in the lower extremity. No cystic structure found in the popliteal fossa.  *See table(s) above for measurements and observations. Electronically signed by Fabienne Brunsharles Fields MD on 08/01/2019 at 4:08:12 PM.    Final     Lab Data:  CBC: Recent Labs  Lab 08/13/19 0435 08/13/19 1121 08/13/19 1544 08/13/19 2042 08/14/19 0255 08/14/19 0917  WBC 8.7 8.5 9.1 8.7 9.0 7.9  NEUTROABS 6.0  --   --   --   --   --   HGB 12.9* 10.8* 10.4* 10.6* 10.0* 11.8*  HCT 39.3 33.4* 31.8* 33.0* 31.5* 36.8*  MCV 83.6 84.3 83.9 85.1 85.1 85.2  PLT 735* 607* 586* 568* 534* 534*   Basic Metabolic Panel: Recent Labs  Lab 08/13/19 0435 08/14/19 0255  NA 141 139  K 3.5 3.9  CL 101 102  CO2 24 27  GLUCOSE 156* 91  BUN <5* 6  CREATININE 0.64 0.64  CALCIUM 8.3* 7.6*   GFR: Estimated Creatinine Clearance: 155.5 mL/min (by C-G formula based on SCr of 0.64 mg/dL). Liver Function Tests: Recent Labs  Lab 08/13/19 0435 08/14/19 0255  AST 23 27  ALT 13 8  ALKPHOS 129* 91  BILITOT 0.2* 1.0  PROT 7.9 6.0*  ALBUMIN 3.0* 2.3*   Recent Labs  Lab 08/13/19 0435  LIPASE 33   No results for input(s): AMMONIA in the last 168 hours. Coagulation Profile: Recent Labs  Lab 08/13/19 1121  INR 1.2   Cardiac Enzymes: No results for input(s): CKTOTAL, CKMB, CKMBINDEX, TROPONINI in  the last 168 hours. BNP (last 3 results) No results for input(s): PROBNP in the last 8760 hours. HbA1C: No results for input(s): HGBA1C in the last 72 hours. CBG: Recent Labs  Lab 08/13/19 2224 08/14/19 0420  GLUCAP 77 82   Lipid Profile: No results for input(s): CHOL, HDL, LDLCALC, TRIG, CHOLHDL, LDLDIRECT in the last 72 hours. Thyroid Function Tests: No results for input(s): TSH, T4TOTAL, FREET4, T3FREE, THYROIDAB in the last 72 hours. Anemia Panel: Recent Labs    08/13/19 1121  RETICCTPCT 1.1   Urine analysis:    Component Value Date/Time   COLORURINE YELLOW 08/13/2019 0325   APPEARANCEUR CLEAR 08/13/2019 0325   LABSPEC 1.013 08/13/2019 0325   PHURINE 5.0 08/13/2019 0325   GLUCOSEU NEGATIVE 08/13/2019 0325   HGBUR SMALL (A) 08/13/2019 0325   BILIRUBINUR NEGATIVE 08/13/2019 0325   KETONESUR NEGATIVE 08/13/2019 0325   PROTEINUR NEGATIVE 08/13/2019 0325   UROBILINOGEN 1.0 07/29/2014 1837   NITRITE NEGATIVE 08/13/2019 0325   LEUKOCYTESUR NEGATIVE 08/13/2019 0325     Jacobb Alen M.D. [facility] 08/14/2019, 9:57 AM  Pager: [phone] Between 7am to 7pm - call Pager - [phone]  After 7pm go to [email or web address] - password [email or web address]  Call night coverage person covering after 7pm

## 2019-08-14 NOTE — Anesthesia Procedure Notes (Signed)
Procedure Name: MAC Date/Time: 08/14/2019 8:01 AM Performed by: Niel Hummer, CRNA Pre-anesthesia Checklist: Patient identified, Emergency Drugs available, Suction available and Patient being monitored Patient Re-evaluated:Patient Re-evaluated prior to induction Oxygen Delivery Method: Simple face mask

## 2019-08-14 NOTE — Interval H&P Note (Signed)
History and Physical Interval Note:  08/14/2019 7:56 AM  Richard Lopez  has presented today for surgery, with the diagnosis of GI bleed.  The various methods of treatment have been discussed with the patient and family. After consideration of risks, benefits and other options for treatment, the patient has consented to  Procedure(s): ESOPHAGOGASTRODUODENOSCOPY (EGD) WITH PROPOFOL (N/A) as a surgical intervention.  The patient's history has been reviewed, patient examined, no change in status, stable for surgery.  I have reviewed the patient's chart and labs.  Questions were answered to the patient's satisfaction.     [name]

## 2019-08-14 NOTE — Transfer of Care (Signed)
Immediate Anesthesia Transfer of Care Note  Patient: Richard Lopez  Procedure(s) Performed: ESOPHAGOGASTRODUODENOSCOPY (EGD) WITH PROPOFOL (N/A )  Patient Location: PACU  Anesthesia Type:MAC  Level of Consciousness: awake, alert  and oriented  Airway & Oxygen Therapy: Patient Spontanous Breathing and Patient connected to face mask oxygen  Post-op Assessment: Report given to RN, Post -op Vital signs reviewed and stable and Patient moving all extremities X 4  Post vital signs: Reviewed and stable  Last Vitals:  Vitals Value Taken Time  BP    Temp    Pulse    Resp    SpO2      Last Pain:  Vitals:   08/14/19 0731  TempSrc: Oral  PainSc: 0-No pain         Complications: No apparent anesthesia complications

## 2019-08-14 NOTE — Op Note (Signed)
[facility] Patient Name: Richard CeriseCarl Fulcher Procedure Date: 08/14/2019 MRN: [id number] Attending MD: Shirley FriarVincent C Danis Pembleton , MD Date of Birth: 11/10/1975 CSN: [id number] Age: [id number] Admit Type: Inpatient Procedure:                Upper GI endoscopy Indications:              Suspected upper gastrointestinal bleeding,                            Hematemesis Providers:                Shirley FriarVincent C. Nia Nathaniel, MD, Margaree MackintoshHayleigh Westmoreland, RN,                            Dayton BailiffMaggie Chrismon, RN, Matthew FolksBrooke Cummings, Technician,                            Marc MorgansShaina Gold, Technician Referring MD:             hospital team Medicines:                Propofol per Anesthesia, Monitored Anesthesia Care Complications:            No immediate complications. Estimated Blood Loss:     Estimated blood loss: none. Procedure:                Pre-Anesthesia Assessment:                           - Prior to the procedure, a History and Physical                            was performed, and patient medications and                            allergies were reviewed. The patient's tolerance of                            previous anesthesia was also reviewed. The risks                            and benefits of the procedure and the sedation                            options and risks were discussed with the patient.                            All questions were answered, and informed consent                            was obtained. Prior Anticoagulants: The patient has                            taken no previous anticoagulant or antiplatelet                            agents. ASA  Grade Assessment: II - A patient with                            mild systemic disease. After reviewing the risks                            and benefits, the patient was deemed in                            satisfactory condition to undergo the procedure.                           After obtaining informed consent, the endoscope was                             passed under direct vision. Throughout the                            procedure, the patient's blood pressure, pulse, and                            oxygen saturations were monitored continuously. The                            GIF-H190 ([id number]) Olympus gastroscope was                            introduced through the mouth, and advanced to the                            second part of duodenum. The upper GI endoscopy was                            somewhat difficult due to ineffective sedation.                            Successful completion of the procedure was aided by                            increasing the dose of sedation medication. The                            patient tolerated the procedure fairly well. Scope In: Scope Out: Findings:      LA Grade C (one or more mucosal breaks continuous between tops of 2 or       more mucosal folds, less than 75% circumference) esophagitis with no       bleeding was found in the distal esophagus.      LA Grade B (one or more mucosal breaks greater than 5 mm, not extending       between the tops of two mucosal folds) esophagitis with no bleeding was       found in the mid esophagus.      Segmental moderate inflammation characterized by congestion (edema) and  erythema was found in the gastric antrum.      A small hiatal hernia was present.      The examined duodenum was normal.      Mucosal changes including ringed esophagus and longitudinal furrows were       found in the mid esophagus. Impression:               - LA Grade C reflux esophagitis.                           - LA Grade B reflux esophagitis.                           - Acute gastritis.                           - Small hiatal hernia.                           - Normal examined duodenum.                           - Esophageal mucosal changes suspicious for                            eosinophilic esophagitis.                           - No specimens  collected. Moderate Sedation:      Not Applicable - Patient had care per Anesthesia. Recommendation:           - Clear liquid diet.                           - Observe patient's clinical course. Procedure Code(s):        --- Professional ---                           [phone], Esophagogastroduodenoscopy, flexible,                            transoral; diagnostic, including collection of                            specimen(s) by brushing or washing, when performed                            (separate procedure) Diagnosis Code(s):        --- Professional ---                           K92.0, Hematemesis                           K21.0, Gastro-esophageal reflux disease with                            esophagitis  K29.00, Acute gastritis without bleeding                           K22.8, Other specified diseases of esophagus                           K44.9, Diaphragmatic hernia without obstruction or                            gangrene CPT copyright 2019 [employer]. All rights reserved. The codes documented in this report are preliminary and upon coder review may  be revised to meet current compliance requirements. [name], MD 08/14/2019 8:28:48 AM This report has been signed electronically. Number of Addenda: 0

## 2019-08-14 NOTE — Brief Op Note (Signed)
Moderate mid and distal erosive ulcerative esophagitis. See endopro note for recs and complete findings. Clear liquid diet. Continue Protonix drip. Continue to hold Eliquis for now. [facility] will f/u tomorrow.

## 2019-08-14 NOTE — Anesthesia Postprocedure Evaluation (Signed)
Anesthesia Post Note  Patient: MISTY RAGO  Procedure(s) Performed: ESOPHAGOGASTRODUODENOSCOPY (EGD) WITH PROPOFOL (N/A )     Patient location during evaluation: PACU Anesthesia Type: MAC Level of consciousness: awake and alert Pain management: pain level controlled Vital Signs Assessment: post-procedure vital signs reviewed and stable Respiratory status: spontaneous breathing, nonlabored ventilation and respiratory function stable Cardiovascular status: stable and blood pressure returned to baseline Postop Assessment: no apparent nausea or vomiting Anesthetic complications: no    Last Vitals:  Vitals:   08/14/19 0840 08/14/19 0905  BP: 134/84   Pulse: 100 91  Resp: 20   Temp:    SpO2: 100%     Last Pain:  Vitals:   08/14/19 0840  TempSrc:   PainSc: 0-No pain                 Amunique Neyra,W. EDMOND

## 2019-08-15 ENCOUNTER — Encounter (HOSPITAL_COMMUNITY): Payer: Self-pay | Admitting: Gastroenterology

## 2019-08-15 LAB — CBC
HCT: 34.6 % — ABNORMAL LOW (ref 39.0–52.0)
Hemoglobin: 10.6 g/dL — ABNORMAL LOW (ref 13.0–17.0)
MCH: 28.2 pg (ref 26.0–34.0)
MCHC: 30.6 g/dL (ref 30.0–36.0)
MCV: 92 fL (ref 80.0–100.0)
Platelets: 350 10*3/uL (ref 150–400)
RBC: 3.76 MIL/uL — ABNORMAL LOW (ref 4.22–5.81)
RDW: 18.3 % — ABNORMAL HIGH (ref 11.5–15.5)
WBC: 8.1 10*3/uL (ref 4.0–10.5)
nRBC: 0 % (ref 0.0–0.2)

## 2019-08-15 LAB — BASIC METABOLIC PANEL
Anion gap: 10 (ref 5–15)
BUN: <5 mg/dL — ABNORMAL LOW (ref 6–20)
CO2: 22 mmol/L (ref 22–32)
Calcium: 7.5 mg/dL — ABNORMAL LOW (ref 8.9–10.3)
Chloride: 102 mmol/L (ref 98–111)
Creatinine, Ser: 0.51 mg/dL — ABNORMAL LOW (ref 0.61–1.24)
GFR calc Af Amer: >60 mL/min (ref >60)
GFR calc non Af Amer: >60 mL/min (ref >60)
Glucose, Bld: 86 mg/dL (ref 70–99)
Potassium: 3.5 mmol/L (ref 3.5–5.1)
Sodium: 134 mmol/L — ABNORMAL LOW (ref 135–145)

## 2019-08-15 LAB — MAGNESIUM: Magnesium: 1.1 mg/dL — ABNORMAL LOW (ref 1.7–2.4)

## 2019-08-15 LAB — ABO/RH: ABO/RH(D): O POS

## 2019-08-15 MED ORDER — PANTOPRAZOLE SODIUM 40 MG IV SOLR
40.0000 mg | Freq: Two times a day (BID) | INTRAVENOUS | Status: DC
  Administered 2019-08-15: 40 mg via INTRAVENOUS
  Filled 2019-08-15 (×2): qty 40

## 2019-08-15 MED ORDER — FENTANYL CITRATE (PF) 100 MCG/2ML IJ SOLN
25.0000 ug | INTRAMUSCULAR | Status: DC | PRN
  Administered 2019-08-15: 25 ug via INTRAVENOUS
  Filled 2019-08-15: qty 2

## 2019-08-15 MED ORDER — OXYCODONE-ACETAMINOPHEN 5-325 MG PO TABS
1.0000 | ORAL_TABLET | Freq: Four times a day (QID) | ORAL | Status: DC | PRN
  Administered 2019-08-15 – 2019-08-16 (×2): 1 via ORAL
  Administered 2019-08-17 (×3): 2 via ORAL
  Filled 2019-08-15: qty 2
  Filled 2019-08-15: qty 1
  Filled 2019-08-15 (×2): qty 2
  Filled 2019-08-15: qty 1

## 2019-08-15 NOTE — Progress Notes (Addendum)
[facility]                                                                              Patient Demographics  Richard CeriseCarl Lopez, is a 43 y.o. male, DOB - 09/19/1976, EAV:[id number]  Admit date - 08/13/2019   Admitting Physician Rolly SalterPranav M Patel, MD  Outpatient Primary MD for the patient is Grayce SessionsEdwards, Michelle P, NP  Outpatient specialists:   LOS - 1  days   Medical records reviewed and are as summarized below:    Chief Complaint  Patient presents with   Shortness of Breath       Brief summary   Patient is a 43 year old male with history of alcohol abuse, recurrent falls with multiple fractures, diet-controlled diabetes, hypertension, marijuana use, recent admission for pneumonia, PE on anticoagulation, was discharged on 10/2 on Eliquis.  Patient presented with hematemesis, heavy alcohol drinker, drank 40 ounces of beer on the day of admission.  After that he started having episodes of nausea, mild abdominal pain in the epigastric region and then an episode of vomiting with bright red blood.  Subsequently patient had few more episodes of hematemesis.  Denied any hematochezia or melena. Patient was admitted for further work-up  Assessment & Plan    Principal problem Acute upper GI bleed in the setting of anticoagulation, alcohol abuse secondary to mid and distal erosive ulcerative esophagitis -Hemoglobin dropped from 12.9 to 10.4 -Patient was placed on IV Protonix drip, GI was consulted -Patient underwent EGD on 10/11 which showed moderate mid and distal erosive ulcerative esophagitis -H&H currently stable, GI recommended PPI 40 mg twice daily x 4 weeks, then daily  - abstinence from alcohol, continue MVI, thiamine, folate.  Hemoglobin stable at 10.6 -Okay to resume Eliquis in a.m.  History of recent acute pulmonary embolism -Patient was discharged on 10/2 on Eliquis. -GI recommends resume Eliquis in a.m. if no further bleeding   History of alcohol  abuse -Currently somnolent, was placed on CIWA protocol scheduled and as needed Ativan -Currently not in any acute declines, continue Ativan as needed  -High risk of withdrawals given daily and heavy alcohol use  Recent aspiration pneumonia - Patient was treated with IV Unasyn and oral Augmentin on discharge, completed on 08/12/2019 - CT chest on 9/28 had shown cavitary lesion in the posterior segment right lower lobe, question cavitary pneumonia versus cavitary neoplasm.  Patient will need pulmonology follow-up for further work-up, ambulatory referral was sent to giving the previous admission.  Diet-controlled diabetes mellitus -CBGs currently stable, continue to monitor  Code Status: Full code DVT Prophylaxis: SCDs Family Communication:  No family at the bedside.  Patient alert and oriented.    Disposition Plan: If CBC remains stable, will resume Eliquis in a.m. and possibly DC home  Time Spent in minutes   35 minutes  Procedures:  EGD  Consultants:   Gastroenterology  Antimicrobials:   Anti-infectives (From admission, onward)   None         Medications  Scheduled Meds:  Chlorhexidine Gluconate Cloth  6 each Topical Daily   folic acid  1 mg Oral Daily   multivitamin with minerals  1  tablet Oral Daily   pantoprazole  40 mg Intravenous Q12H   sodium chloride flush  3 mL Intravenous Q12H   thiamine  100 mg Oral Daily   Or   thiamine  100 mg Intravenous Daily   Continuous Infusions:  lactated ringers 75 mL/hr at 08/13/19 2303   PRN Meds:.acetaminophen **OR** acetaminophen, LORazepam **OR** LORazepam, morphine injection, ondansetron **OR** ondansetron (ZOFRAN) IV      Subjective:   Richard Lopez was seen and examined today.  Fairly alert and oriented today, denies any specific complaints.  No nausea or vomiting at this time.  Diet advanced.  No acute events overnight.  No fevers or chills.  Objective:   Vitals:   08/15/19 0436 08/15/19 0500 08/15/19  0623 08/15/19 1510  BP: (!) 136/94  122/67 127/68  Pulse: 79  78 76  Resp: 18  18 18   Temp: 99.7 F (37.6 C)  99.3 F (37.4 C) 98.8 F (37.1 C)  TempSrc: Oral  Oral Oral  SpO2: 100%  98% 99%  Weight:  97.5 kg    Height:        Intake/Output Summary (Last 24 hours) at 08/15/2019 1534 Last data filed at 08/15/2019 1520 Gross per 24 hour  Intake 677.87 ml  Output 3275 ml  Net -2597.13 ml     Wt Readings from Last 3 Encounters:  08/15/19 97.5 kg  08/01/19 99.8 kg  05/18/19 95.3 kg   Physical Exam  General: Alert and oriented x 3, NAD  Eyes  HEENT:  Atraumatic, normocephalic  Cardiovascular: S1 S2 clear, RRR. No pedal edema b/l  Respiratory: CTAB, no wheezing, rales or rhonchi  Gastrointestinal: Soft, nontender, nondistended, NBS  Ext: no pedal edema bilaterally, no tremors  Neuro: no new deficits  Musculoskeletal: No cyanosis, clubbing  Skin: No rashes  Psych: Normal affect and demeanor, alert and oriented x3    Data Reviewed:  I have personally reviewed following labs and imaging studies  Micro Results Recent Results (from the past 240 hour(s))  SARS Coronavirus 2 by RT PCR (hospital order, performed in [place] hospital lab)     Status: None   Collection Time: 08/13/19  6:18 AM  Result Value Ref Range Status   SARS Coronavirus 2 NEGATIVE NEGATIVE Final    Comment: (NOTE) If result is NEGATIVE SARS-CoV-2 target nucleic acids are NOT DETECTED. The SARS-CoV-2 RNA is generally detectable in upper and lower  respiratory specimens during the acute phase of infection. The lowest  concentration of SARS-CoV-2 viral copies this assay can detect is 250  copies / mL. A negative result does not preclude SARS-CoV-2 infection  and should not be used as the sole basis for treatment or other  patient management decisions.  A negative result may occur with  improper specimen collection / handling, submission of specimen other  than nasopharyngeal swab, presence  of viral mutation(s) within the  areas targeted by this assay, and inadequate number of viral copies  (<250 copies / mL). A negative result must be combined with clinical  observations, patient history, and epidemiological information. If result is POSITIVE SARS-CoV-2 target nucleic acids are DETECTED. The SARS-CoV-2 RNA is generally detectable in upper and lower  respiratory specimens dur ing the acute phase of infection.  Positive  results are indicative of active infection with SARS-CoV-2.  Clinical  correlation with patient history and other diagnostic information is  necessary to determine patient infection status.  Positive results do  not rule out bacterial infection or co-infection with other  viruses. If result is PRESUMPTIVE POSTIVE SARS-CoV-2 nucleic acids MAY BE PRESENT.   A presumptive positive result was obtained on the submitted specimen  and confirmed on repeat testing.  While 2019 novel coronavirus  (SARS-CoV-2) nucleic acids may be present in the submitted sample  additional confirmatory testing may be necessary for epidemiological  and / or clinical management purposes  to differentiate between  SARS-CoV-2 and other Sarbecovirus currently known to infect humans.  If clinically indicated additional testing with an alternate test  methodology [phone]) is advised. The SARS-CoV-2 RNA is generally  detectable in upper and lower respiratory sp ecimens during the acute  phase of infection. The expected result is Negative. Fact Sheet for Patients:  BoilerBrush.com.cy Fact Sheet for Healthcare Providers: https://pope.com/ This test is not yet approved or cleared by the [place] FDA and has been authorized for detection and/or diagnosis of SARS-CoV-2 by FDA under an Emergency Use Authorization (EUA).  This EUA will remain in effect (meaning this test can be used) for the duration of the COVID-19 declaration under Section  564(b)(1) of the Act, 21 U.S.C. section 360bbb-3(b)(1), unless the authorization is terminated or revoked sooner. Performed at [facility], [address] [place], [place] [address]     Radiology Reports Dg Chest 2 View  Result Date: 08/01/2019 CLINICAL DATA:  Shortness of breath. Right-sided chest pain. EXAM: CHEST - 2 VIEW COMPARISON:  05/16/2019 FINDINGS: Normal heart size and mediastinal contours. Mild patchy opacity in the right lung base and infrahilar lung. Left lung is clear. No pulmonary edema. No definite pleural effusion. No pneumothorax. No acute osseous abnormalities are seen. IMPRESSION: Mild patchy opacity in the right lung base, may reflect atelectasis or pneumonia. Electronically Signed   By: Narda Rutherford M.D.   On: 08/01/2019 02:20   Ct Angio Chest Pe W And/or Wo Contrast  Result Date: 08/01/2019 CLINICAL DATA:  Shortness of breath and chest pain.  Fever. EXAM: CT ANGIOGRAPHY CHEST WITH CONTRAST TECHNIQUE: Multidetector CT imaging of the chest was performed using the standard protocol during bolus administration of intravenous contrast. Multiplanar CT image reconstructions and MIPs were obtained to evaluate the vascular anatomy. CONTRAST:  OMNIPAQUE IOHEXOL 350 MG/ML SOLN COMPARISON:  Chest radiograph August 01, 2019 FINDINGS: Cardiovascular: There are multiple pulmonary emboli involving multiple lower lobe pulmonary artery branches bilaterally. There is a slightly greater degree of pulmonary embolic change in the right lower lobe and left lower lobe. There is an incompletely obstructing pulmonary embolus in the posterior segment right upper lobe pulmonary artery branch. No central pulmonary embolus is evident. There is no right heart strain; the right ventricle to left ventricle diameter ratio is less than 0.9. There is no demonstrable thoracic aortic aneurysm or dissection. The visualized great vessels appear unremarkable. There are multiple foci  of coronary artery calcification. There is no appreciable pericardial effusion or pericardial thickening. Mediastinum/Nodes: Thyroid appears normal. There is a 1.5 x 1.3 cm subcarinal lymph node. There is a right hilar lymph node measuring 1.3 x 1.1 cm. No esophageal lesions are evident. Lungs/Pleura: There is a fairly small right pleural effusion. There is airspace consolidation consistent with pneumonia involving portions of the superior and lateral segments of the right lower lobe. There is a cavitary lesion in the posterior segment of the right lower lobe measuring 3.8 x 2.6 cm. The lungs elsewhere are clear. Upper Abdomen: Visualized upper abdominal structures appear normal. Musculoskeletal: There is degenerative change in the thoracic spine. No blastic or lytic bone  lesions are evident. There are no chest wall lesions. Review of the MIP images confirms the above findings. IMPRESSION: 1.  Multiple pulmonary emboli.  No right heart strain. 2. Cavitary lesion in the posterior segment right lower lobe. Question cavitary pneumonia versus cavitary neoplasm. 3. Fairly small right pleural effusion. Airspace consolidation consistent with pneumonia involving portions of the superior and lateral segments of the right lower lobe. 4. Prominent right hilar and subcarinal lymph nodes. Neoplastic etiology for this adenopathy cannot be excluded. 5.  Multiple foci of coronary artery calcification. Critical Value/emergent results were called by telephone at the time of interpretation on 08/01/2019 at 12:03 pm to providerWYLDER FONDAW , who verbally acknowledged these results. Electronically Signed   By: Bretta Bang III M.D.   On: 08/01/2019 12:05   Dg Chest Port 1 View  Result Date: 08/13/2019 CLINICAL DATA:  Shortness of breath EXAM: PORTABLE CHEST 1 VIEW COMPARISON:  Chest radiograph 08/01/2019 Chest CTA 08/01/2019 FINDINGS: There is increased opacity at the right lung base. Left lung is clear. No pleural effusion or  pneumothorax. Normal cardiomediastinal contours. IMPRESSION: Increased opacity at the right lung base, which may indicate pneumonia or pulmonary infarct in the setting of the recent pulmonary embolus. Electronically Signed   By: Deatra Robinson M.D.   On: 08/13/2019 05:36   Vas [place] Lower Extremity Venous (dvt)  Result Date: 08/01/2019  Lower Venous Study Indications: Pain, and Swelling.  Risk Factors: None identified. Anticoagulation: Heparin. Comparison Study: No prior studies. Performing Technologist: Chanda Busing RVT  Examination Guidelines: A complete evaluation includes B-mode imaging, spectral Doppler, color Doppler, and power Doppler as needed of all accessible portions of each vessel. Bilateral testing is considered an integral part of a complete examination. Limited examinations for reoccurring indications may be performed as noted.  +---------+---------------+---------+-----------+----------+--------------+  RIGHT     Compressibility Phasicity Spontaneity Properties Thrombus Aging  +---------+---------------+---------+-----------+----------+--------------+  CFV       Full            Yes       Yes                                    +---------+---------------+---------+-----------+----------+--------------+  SFJ       Full                                                             +---------+---------------+---------+-----------+----------+--------------+  FV Prox   Full                                                             +---------+---------------+---------+-----------+----------+--------------+  FV Mid    Full                                                             +---------+---------------+---------+-----------+----------+--------------+  FV Distal Full                                                             +---------+---------------+---------+-----------+----------+--------------+  PFV       Full                                                              +---------+---------------+---------+-----------+----------+--------------+  POP       Partial         No        No                     Acute           +---------+---------------+---------+-----------+----------+--------------+  PTV       None                                             Acute           +---------+---------------+---------+-----------+----------+--------------+  PERO      Full                                                             +---------+---------------+---------+-----------+----------+--------------+  Gastroc   Partial                                          Acute           +---------+---------------+---------+-----------+----------+--------------+   +---------+---------------+---------+-----------+----------+--------------+  LEFT      Compressibility Phasicity Spontaneity Properties Thrombus Aging  +---------+---------------+---------+-----------+----------+--------------+  CFV       Full            Yes       Yes                                    +---------+---------------+---------+-----------+----------+--------------+  SFJ       Full                                                             +---------+---------------+---------+-----------+----------+--------------+  FV Prox   Full                                                             +---------+---------------+---------+-----------+----------+--------------+  FV Mid    Full                                                             +---------+---------------+---------+-----------+----------+--------------+  FV Distal Full                                                             +---------+---------------+---------+-----------+----------+--------------+  PFV       Full                                                             +---------+---------------+---------+-----------+----------+--------------+  POP       Full            Yes       Yes                                     +---------+---------------+---------+-----------+----------+--------------+  PTV       Full                                                             +---------+---------------+---------+-----------+----------+--------------+  PERO      Full                                                             +---------+---------------+---------+-----------+----------+--------------+     Summary: Right: Findings consistent with acute deep vein thrombosis involving the right popliteal vein, right posterior tibial veins, and right gastrocnemius veins. No cystic structure found in the popliteal fossa. Left: There is no evidence of deep vein thrombosis in the lower extremity. No cystic structure found in the popliteal fossa.  *See table(s) above for measurements and observations. Electronically signed by Fabienne Brunsharles Fields MD on 08/01/2019 at 4:08:12 PM.    Final     Lab Data:  CBC: Recent Labs  Lab 08/13/19 0435  08/13/19 1544 08/13/19 2042 08/14/19 0255 08/14/19 0917 08/15/19 0348  WBC 8.7   < > 9.1 8.7 9.0 7.9 8.1  NEUTROABS 6.0  --   --   --   --   --   --   HGB 12.9*   < > 10.4* 10.6* 10.0* 11.8* 10.6*  HCT 39.3   < > 31.8* 33.0* 31.5* 36.8* 34.6*  MCV 83.6   < > 83.9 85.1 85.1 85.2 92.0  PLT 735*   < > 586* 568* 534* 534* 350   < > = values in this interval not displayed.   Basic Metabolic Panel: Recent Labs  Lab 08/13/19 0435 08/14/19 0255 08/15/19 0348  NA 141 139 134*  K 3.5 3.9 3.5  CL 101 102 102  CO2 24 27 22   GLUCOSE 156* 91 86  BUN <5* 6 <5*  CREATININE 0.64 0.64 0.51*  CALCIUM 8.3* 7.6* 7.5*  MG  --  1.3* 1.1*   GFR: Estimated Creatinine  Clearance: 155.5 mL/min (A) (by C-G formula based on SCr of 0.51 mg/dL (L)). Liver Function Tests: Recent Labs  Lab 08/13/19 0435 08/14/19 0255  AST 23 27  ALT 13 8  ALKPHOS 129* 91  BILITOT 0.2* 1.0  PROT 7.9 6.0*  ALBUMIN 3.0* 2.3*   Recent Labs  Lab 08/13/19 0435  LIPASE 33   No results for input(s): AMMONIA in the last 168  hours. Coagulation Profile: Recent Labs  Lab 08/13/19 1121  INR 1.2   Cardiac Enzymes: No results for input(s): CKTOTAL, CKMB, CKMBINDEX, TROPONINI in the last 168 hours. BNP (last 3 results) No results for input(s): PROBNP in the last 8760 hours. HbA1C: No results for input(s): HGBA1C in the last 72 hours. CBG: Recent Labs  Lab 08/13/19 2224 08/14/19 0420 08/14/19 0905  GLUCAP 77 82 90   Lipid Profile: No results for input(s): CHOL, HDL, LDLCALC, TRIG, CHOLHDL, LDLDIRECT in the last 72 hours. Thyroid Function Tests: No results for input(s): TSH, T4TOTAL, FREET4, T3FREE, THYROIDAB in the last 72 hours. Anemia Panel: Recent Labs    08/13/19 1121  RETICCTPCT 1.1   Urine analysis:    Component Value Date/Time   COLORURINE YELLOW 08/13/2019 0325   APPEARANCEUR CLEAR 08/13/2019 0325   LABSPEC 1.013 08/13/2019 0325   PHURINE 5.0 08/13/2019 0325   GLUCOSEU NEGATIVE 08/13/2019 0325   HGBUR SMALL (A) 08/13/2019 0325   BILIRUBINUR NEGATIVE 08/13/2019 0325   KETONESUR NEGATIVE 08/13/2019 0325   PROTEINUR NEGATIVE 08/13/2019 0325   UROBILINOGEN 1.0 07/29/2014 1837   NITRITE NEGATIVE 08/13/2019 0325   LEUKOCYTESUR NEGATIVE 08/13/2019 0325     Richard Lopez M.D. [facility] 08/15/2019, 3:34 PM  Pager: [phone] Between 7am to 7pm - call Pager - [phone]  After 7pm go to [email or web address] - password [email or web address]  Call night coverage person covering after 7pm

## 2019-08-15 NOTE — Progress Notes (Addendum)
Subjective: Patient is able to tolerate soft diet, reports having one episode of dark stools yesterday, denies further episodes of nausea vomiting, complains of right-sided upper abdominal pain which he states is related to his cavitary lung lesion.  Objective: Vital signs in last 24 hours: Temp:  [98.2 F (36.8 C)-99.7 F (37.6 C)] 99.3 F (37.4 C) (10/12 0623) Pulse Rate:  [28-105] 78 (10/12 0623) Resp:  [15-20] 18 (10/12 0623) BP: (122-140)/(56-94) 122/67 (10/12 0623) SpO2:  [98 %-100 %] 98 % (10/12 0623) Weight:  [97.5 kg] 97.5 kg (10/12 0500) Weight change: 1.4 kg Last BM Date: 08/13/19  PE: Not in distress, mild pallor GENERAL: No icterus ABDOMEN: Soft, nondistended, nontender EXTREMITIES: No deformity  Lab Results: Results for orders placed or performed during the hospital encounter of 08/13/19 (from the past 48 hour(s))  CBC     Status: Abnormal   Collection Time: 08/13/19 11:21 AM  Result Value Ref Range   WBC 8.5 4.0 - 10.5 K/uL   RBC 3.96 (L) 4.22 - 5.81 MIL/uL   Hemoglobin 10.8 (L) 13.0 - 17.0 g/dL   HCT 42.6 (L) 83.4 - 19.6 %   MCV 84.3 80.0 - 100.0 fL   MCH 27.3 26.0 - 34.0 pg   MCHC 32.3 30.0 - 36.0 g/dL   RDW 22.2 (H) 97.9 - 89.2 %   Platelets 607 (H) 150 - 400 K/uL   nRBC 0.0 0.0 - 0.2 %    Comment: Performed at [facility], [address], [place], [place] [address]  Protime-INR     Status: Abnormal   Collection Time: 08/13/19 11:21 AM  Result Value Ref Range   Prothrombin Time 15.3 (H) 11.4 - 15.2 seconds   INR 1.2 0.8 - 1.2    Comment: (NOTE) INR goal varies based on device and disease states. Performed at [facility], [address], [place], [place] [address]   APTT     Status: Abnormal   Collection Time: 08/13/19 11:21 AM  Result Value Ref Range   aPTT 39 (H) 24 - 36 seconds    Comment:        IF BASELINE aPTT IS ELEVATED, SUGGEST PATIENT RISK ASSESSMENT BE USED TO DETERMINE  APPROPRIATE ANTICOAGULANT THERAPY. Performed at [facility], [address] [place], [place] [address]   Reticulocytes     Status: Abnormal   Collection Time: 08/13/19 11:21 AM  Result Value Ref Range   Retic Ct Pct 1.1 0.4 - 3.1 %   RBC. 3.96 (L) 4.22 - 5.81 MIL/uL   Retic Count, Absolute 42.0 19.0 - 186.0 K/uL   Immature Retic Fract 12.9 2.3 - 15.9 %    Comment: Performed at [facility], [address] [place], [place] [address]  CBC     Status: Abnormal   Collection Time: 08/13/19  3:44 PM  Result Value Ref Range   WBC 9.1 4.0 - 10.5 K/uL   RBC 3.79 (L) 4.22 - 5.81 MIL/uL   Hemoglobin 10.4 (L) 13.0 - 17.0 g/dL   HCT 97.0 (L) 26.3 - 78.5 %   MCV 83.9 80.0 - 100.0 fL   MCH 27.4 26.0 - 34.0 pg   MCHC 32.7 30.0 - 36.0 g/dL   RDW 88.5 (H) 02.7 - 74.1 %   Platelets 586 (H) 150 - 400 K/uL   nRBC 0.0 0.0 - 0.2 %    Comment: Performed at [facility], [address] [place], [place] [address]  CBC  Status: Abnormal   Collection Time: 08/13/19  8:42 PM  Result Value Ref Range   WBC 8.7 4.0 - 10.5 K/uL   RBC 3.88 (L) 4.22 - 5.81 MIL/uL   Hemoglobin 10.6 (L) 13.0 - 17.0 g/dL   HCT 33.0 (L) 39.0 - 52.0 %   MCV 85.1 80.0 - 100.0 fL   MCH 27.3 26.0 - 34.0 pg   MCHC 32.1 30.0 - 36.0 g/dL   RDW 18.2 (H) 11.5 - 15.5 %   Platelets 568 (H) 150 - 400 K/uL   nRBC 0.0 0.0 - 0.2 %    Comment: Performed at [facility], [place] [address], [place], [place] [address]  Glucose, capillary     Status: None   Collection Time: 08/13/19 10:24 PM  Result Value Ref Range   Glucose-Capillary 77 70 - 99 mg/dL   Comment 1 Notify RN   CBC     Status: Abnormal   Collection Time: 08/14/19  2:55 AM  Result Value Ref Range   WBC 9.0 4.0 - 10.5 K/uL   RBC 3.70 (L) 4.22 - 5.81 MIL/uL   Hemoglobin 10.0 (L) 13.0 - 17.0 g/dL   HCT 31.5 (L) 39.0 - 52.0 %   MCV 85.1 80.0 - 100.0 fL   MCH 27.0 26.0 - 34.0 pg   MCHC 31.7 30.0 -  36.0 g/dL   RDW 18.0 (H) 11.5 - 15.5 %   Platelets 534 (H) 150 - 400 K/uL   nRBC 0.0 0.0 - 0.2 %    Comment: Performed at [facility], [place] [address] [place], [place] [address]  Comprehensive metabolic panel     Status: Abnormal   Collection Time: 08/14/19  2:55 AM  Result Value Ref Range   Sodium 139 135 - 145 mmol/L   Potassium 3.9 3.5 - 5.1 mmol/L   Chloride 102 98 - 111 mmol/L   CO2 27 22 - 32 mmol/L   Glucose, Bld 91 70 - 99 mg/dL   BUN 6 6 - 20 mg/dL   Creatinine, Ser 0.64 0.61 - 1.24 mg/dL   Calcium 7.6 (L) 8.9 - 10.3 mg/dL   Total Protein 6.0 (L) 6.5 - 8.1 g/dL   Albumin 2.3 (L) 3.5 - 5.0 g/dL   AST 27 15 - 41 U/L   ALT 8 0 - 44 U/L   Alkaline Phosphatase 91 38 - 126 U/L   Total Bilirubin 1.0 0.3 - 1.2 mg/dL   GFR calc non Af Amer >60 >60 mL/min   GFR calc Af Amer >60 >60 mL/min   Anion gap 10 5 - 15    Comment: Performed at [facility], [place] [address], [place], [place] [address]  Magnesium     Status: Abnormal   Collection Time: 08/14/19  2:55 AM  Result Value Ref Range   Magnesium 1.3 (L) 1.7 - 2.4 mg/dL    Comment: Performed at [facility], [place] [address] [place], [place] [address]  Glucose, capillary     Status: None   Collection Time: 08/14/19  4:20 AM  Result Value Ref Range   Glucose-Capillary 82 70 - 99 mg/dL   Comment 1 Notify RN   Glucose, capillary     Status: None   Collection Time: 08/14/19  9:05 AM  Result Value Ref Range   Glucose-Capillary 90 70 - 99 mg/dL  CBC     Status: Abnormal   Collection Time: 08/14/19  9:17 AM  Result Value Ref Range  WBC 7.9 4.0 - 10.5 K/uL   RBC 4.32 4.22 - 5.81 MIL/uL   Hemoglobin 11.8 (L) 13.0 - 17.0 g/dL   HCT 18.5 (L) 63.1 - 49.7 %   MCV 85.2 80.0 - 100.0 fL   MCH 27.3 26.0 - 34.0 pg   MCHC 32.1 30.0 - 36.0 g/dL   RDW 02.6 (H) 37.8 - 58.8 %   Platelets 534 (H) 150 - 400 K/uL   nRBC 0.0 0.0 - 0.2 %    Comment: Performed at [facility], [address], [place], [place] [address]  Basic metabolic panel     Status: Abnormal   Collection Time: 08/15/19  3:48 AM  Result Value Ref Range   Sodium 134 (L) 135 - 145 mmol/L   Potassium 3.5 3.5 - 5.1 mmol/L   Chloride 102 98 - 111 mmol/L   CO2 22 22 - 32 mmol/L   Glucose, Bld 86 70 - 99 mg/dL   BUN <5 (L) 6 - 20 mg/dL   Creatinine, Ser 4.12 (L) 0.61 - 1.24 mg/dL   Calcium 7.5 (L) 8.9 - 10.3 mg/dL   GFR calc non Af Amer >60 >60 mL/min   GFR calc Af Amer >60 >60 mL/min   Anion gap 10 5 - 15    Comment: Performed at [facility], [address], [place], [place] [address]  CBC     Status: Abnormal   Collection Time: 08/15/19  3:48 AM  Result Value Ref Range   WBC 8.1 4.0 - 10.5 K/uL   RBC 3.76 (L) 4.22 - 5.81 MIL/uL   Hemoglobin 10.6 (L) 13.0 - 17.0 g/dL   HCT 67.2 (L) 09.4 - 70.9 %   MCV 92.0 80.0 - 100.0 fL    Comment: REPEATED TO VERIFY DELTA CHECK NOTED    MCH 28.2 26.0 - 34.0 pg   MCHC 30.6 30.0 - 36.0 g/dL   RDW 62.8 (H) 36.6 - 29.4 %   Platelets 350 150 - 400 K/uL   nRBC 0.0 0.0 - 0.2 %    Comment: Performed at [facility], [address] [place], [place] [address]  Magnesium     Status: Abnormal   Collection Time: 08/15/19  3:48 AM  Result Value Ref Range   Magnesium 1.1 (L) 1.7 - 2.4 mg/dL    Comment: Performed at [facility], [address] [place], [place] [address]    Studies/Results: No results found.  Medications: I have reviewed the patient's current medications.  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 Recommend abstinence from alcohol abuse, continue folic acid, multivitamin  and thiamine.  History of multiple bilateral PE noted on CT from 08/01/2019-okay to resume Eliquis in a.m.  Cavitary lesion in posterior segment of right lower lobe measuring 3.8 x 2.6 cm?  Possible cause of right upper quadrant abdominal pain.  We will sign off, please recall if needed.  Kerin Salen, MD 08/15/2019, 10:27 AM

## 2019-08-15 NOTE — Progress Notes (Signed)
Patient report coughing with blood streak on his sputum. Patient stable with no other complains. We will continue to monitor.

## 2019-08-16 DIAGNOSIS — K2210 Ulcer of esophagus without bleeding: Secondary | ICD-10-CM

## 2019-08-16 DIAGNOSIS — J189 Pneumonia, unspecified organism: Secondary | ICD-10-CM

## 2019-08-16 DIAGNOSIS — J984 Other disorders of lung: Secondary | ICD-10-CM

## 2019-08-16 DIAGNOSIS — Z86711 Personal history of pulmonary embolism: Secondary | ICD-10-CM

## 2019-08-16 LAB — CBC
HCT: 30.2 % — ABNORMAL LOW (ref 39.0–52.0)
Hemoglobin: 10 g/dL — ABNORMAL LOW (ref 13.0–17.0)
MCH: 27.5 pg (ref 26.0–34.0)
MCHC: 33.1 g/dL (ref 30.0–36.0)
MCV: 83 fL (ref 80.0–100.0)
Platelets: 275 10*3/uL (ref 150–400)
RBC: 3.64 MIL/uL — ABNORMAL LOW (ref 4.22–5.81)
RDW: 17.5 % — ABNORMAL HIGH (ref 11.5–15.5)
WBC: 7.5 10*3/uL (ref 4.0–10.5)
nRBC: 0 % (ref 0.0–0.2)

## 2019-08-16 LAB — BASIC METABOLIC PANEL
Anion gap: 10 (ref 5–15)
BUN: <5 mg/dL — ABNORMAL LOW (ref 6–20)
CO2: 25 mmol/L (ref 22–32)
Calcium: 7.3 mg/dL — ABNORMAL LOW (ref 8.9–10.3)
Chloride: 99 mmol/L (ref 98–111)
Creatinine, Ser: 0.6 mg/dL — ABNORMAL LOW (ref 0.61–1.24)
GFR calc Af Amer: >60 mL/min (ref >60)
GFR calc non Af Amer: >60 mL/min (ref >60)
Glucose, Bld: 137 mg/dL — ABNORMAL HIGH (ref 70–99)
Potassium: 2.9 mmol/L — ABNORMAL LOW (ref 3.5–5.1)
Sodium: 134 mmol/L — ABNORMAL LOW (ref 135–145)

## 2019-08-16 LAB — MAGNESIUM
Magnesium: 0.9 mg/dL — CL (ref 1.7–2.4)
Magnesium: 1.7 mg/dL (ref 1.7–2.4)

## 2019-08-16 LAB — HEPARIN LEVEL (UNFRACTIONATED): Heparin Unfractionated: 0.15 IU/mL — ABNORMAL LOW (ref 0.30–0.70)

## 2019-08-16 LAB — PHOSPHORUS: Phosphorus: 3.2 mg/dL (ref 2.5–4.6)

## 2019-08-16 LAB — APTT: aPTT: 79 seconds — ABNORMAL HIGH (ref 24–36)

## 2019-08-16 LAB — MRSA PCR SCREENING: MRSA by PCR: NEGATIVE

## 2019-08-16 LAB — POTASSIUM: Potassium: 3.6 mmol/L (ref 3.5–5.1)

## 2019-08-16 MED ORDER — MAGNESIUM SULFATE 4 GM/100ML IV SOLN
4.0000 g | Freq: Once | INTRAVENOUS | Status: AC
  Administered 2019-08-16: 4 g via INTRAVENOUS
  Filled 2019-08-16 (×2): qty 100

## 2019-08-16 MED ORDER — ADULT MULTIVITAMIN W/MINERALS CH: 1.0000 | ORAL_TABLET | Freq: Every day | ORAL | Status: DC

## 2019-08-16 MED ORDER — MAGNESIUM OXIDE 400 (241.3 MG) MG PO TABS
400.0000 mg | ORAL_TABLET | Freq: Every day | ORAL | Status: DC
  Administered 2019-08-17 – 2019-08-18 (×2): 400 mg via ORAL
  Filled 2019-08-16 (×2): qty 1

## 2019-08-16 MED ORDER — PANTOPRAZOLE SODIUM 40 MG PO TBEC
40.0000 mg | DELAYED_RELEASE_TABLET | Freq: Two times a day (BID) | ORAL | Status: DC
  Administered 2019-08-16 – 2019-08-18 (×5): 40 mg via ORAL
  Filled 2019-08-16 (×5): qty 1

## 2019-08-16 MED ORDER — HEPARIN (PORCINE) 25000 UT/250ML-% IV SOLN
1400.0000 [IU]/h | INTRAVENOUS | Status: DC
  Administered 2019-08-16: 14:00:00 1400 [IU]/h via INTRAVENOUS
  Filled 2019-08-16 (×2): qty 250

## 2019-08-16 MED ORDER — MAGNESIUM OXIDE 400 (241.3 MG) MG PO TABS
400.0000 mg | ORAL_TABLET | Freq: Two times a day (BID) | ORAL | Status: DC
  Administered 2019-08-16: 17:00:00 400 mg via ORAL

## 2019-08-16 MED ORDER — MAGNESIUM SULFATE 50 % IJ SOLN: 4.0000 g | Freq: Once | INTRAVENOUS | Status: DC

## 2019-08-16 MED ORDER — HEPARIN BOLUS VIA INFUSION
3000.0000 [IU] | Freq: Once | INTRAVENOUS | Status: AC
  Administered 2019-08-16: 3000 [IU] via INTRAVENOUS
  Filled 2019-08-16: qty 3000

## 2019-08-16 MED ORDER — ONDANSETRON 4 MG PO TBDP
4.0000 mg | ORAL_TABLET | Freq: Four times a day (QID) | ORAL | Status: DC | PRN
  Administered 2019-08-16: 16:00:00 4 mg via ORAL
  Filled 2019-08-16: qty 1

## 2019-08-16 MED ORDER — VITAMIN D 25 MCG (1000 UNIT) PO TABS
2000.0000 [IU] | ORAL_TABLET | Freq: Two times a day (BID) | ORAL | Status: DC
  Administered 2019-08-16 – 2019-08-18 (×5): 2000 [IU] via ORAL
  Filled 2019-08-16 (×5): qty 2

## 2019-08-16 MED ORDER — HEPARIN (PORCINE) 25000 UT/250ML-% IV SOLN
1800.0000 [IU]/h | INTRAVENOUS | Status: AC
  Administered 2019-08-16 – 2019-08-17 (×3): 1800 [IU]/h via INTRAVENOUS
  Filled 2019-08-16 (×5): qty 250

## 2019-08-16 MED ORDER — POTASSIUM CHLORIDE CRYS ER 20 MEQ PO TBCR
40.0000 meq | EXTENDED_RELEASE_TABLET | ORAL | Status: AC
  Administered 2019-08-16 (×2): 40 meq via ORAL
  Filled 2019-08-16 (×2): qty 2

## 2019-08-16 NOTE — Progress Notes (Signed)
ANTICOAGULATION CONSULT NOTE - follow up  Pharmacy Consult for Heparin Indication: pulmonary embolus  No Known Allergies  Patient Measurements: Height: 6\' 6"  (198.1 cm) Weight: 214 lb 15.2 oz (97.5 kg) IBW/kg (Calculated) : 91.4 Heparin Dosing Weight: 97.5 kg  Vital Signs: Temp: 98.4 F (36.9 C) (10/13 1439) Temp Source: Oral (10/13 1439) BP: 139/91 (10/13 1439) Pulse Rate: 76 (10/13 1439)  Labs: Recent Labs    08/14/19 0255 08/14/19 0917 08/15/19 0348 08/16/19 0404 08/16/19 1954  HGB 10.0* 11.8* 10.6* 10.0*  --   HCT 31.5* 36.8* 34.6* 30.2*  --   PLT 534* 534* 350 275  --   APTT  --   --   --   --  79*  HEPARINUNFRC  --   --   --   --  0.15*  CREATININE 0.64  --  0.51* 0.60*  --    Estimated Creatinine Clearance: 155.5 mL/min (A) (by C-G formula based on SCr of 0.6 mg/dL (L)).  Medical History: Past Medical History:  Diagnosis Date  . Anxiety   . 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 Depression   . Diabetes mellitus    diet controlled  . Essential hypertension   . Gastritis   . Gout   . Pulmonary embolism (HCC)    Medications:  Scheduled:  . Chlorhexidine Gluconate Cloth  6 each Topical Daily  . cholecalciferol  2,000 Units Oral BID  . folic acid  1 mg Oral Daily  . [START ON 08/17/2019] magnesium oxide  400 mg Oral Daily  . multivitamin with minerals  1 tablet Oral Daily  . pantoprazole  40 mg Oral BID  . sodium chloride flush  3 mL Intravenous Q12H  . thiamine  100 mg Oral Daily   Or  . thiamine  100 mg Intravenous Daily   Infusions:  . heparin 1,400 Units/hr (08/16/19 1336)    Assessment: 71 yoM admit 10/10 with GI bleed, ETOH abuse.  9/28 - 10/2 admit: CT with PE > Eliquis starting 9/30.  10/11EGD mod mid & distal erosive esophagitis  - Last dose Eliquis noted 10/9 at 9am; aPTT ordered for 10/10 am was 39 - question if patient compliant with Eliquis PTA  PM, 08/16/19  1st heparin level SUBtherapeutic on current IV heparin rate of 1400  units/hr, which also indicates that the patient likely was not taking apixaban prior to admission  No reported bleeding   Goal of Therapy:  Heparin level 0.3-0.7 units/ml aPTT 66-102 seconds Monitor platelets by anticoagulation protocol: Yes   Plan:   Give IV heparin 3000 unit bolus then  Increase IV heparin rate from 1400 units/hr to 1800 units/hr  Recheck heparin level 6 hours after bolus/rate increase  Daily CBC   Kara Mead PharmD 08/16/2019,8:54 PM

## 2019-08-16 NOTE — Progress Notes (Signed)
ANTICOAGULATION CONSULT NOTE - Initial Consult  Pharmacy Consult for Heparin Indication: pulmonary embolus  No Known Allergies  Patient Measurements: Height: 6\' 6"  (198.1 cm) Weight: 214 lb 15.2 oz (97.5 kg) IBW/kg (Calculated) : 91.4 Heparin Dosing Weight: 97.5 kg  Vital Signs: Temp: 98.2 F (36.8 C) (10/13 0634) Temp Source: Oral (10/13 0634) BP: 126/90 (10/13 1120) Pulse Rate: 81 (10/13 1120)  Labs: Recent Labs    08/14/19 0255 08/14/19 0917 08/15/19 0348 08/16/19 0404  HGB 10.0* 11.8* 10.6* 10.0*  HCT 31.5* 36.8* 34.6* 30.2*  PLT 534* 534* 350 275  CREATININE 0.64  --  0.51* 0.60*   Estimated Creatinine Clearance: 155.5 mL/min (A) (by C-G formula based on SCr of 0.6 mg/dL (L)).  Medical History: Past Medical History:  Diagnosis Date  . Anxiety   . 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 Depression   . Diabetes mellitus    diet controlled  . Essential hypertension   . Gastritis   . Gout   . Pulmonary embolism (HCC)    Medications:  Scheduled:  . Chlorhexidine Gluconate Cloth  6 each Topical Daily  . cholecalciferol  2,000 Units Oral BID  . folic acid  1 mg Oral Daily  . multivitamin with minerals  1 tablet Oral Daily  . pantoprazole  40 mg Oral BID  . sodium chloride flush  3 mL Intravenous Q12H  . thiamine  100 mg Oral Daily   Or  . thiamine  100 mg Intravenous Daily   Infusions:  . heparin      Assessment: 87 yoM admit 10/10 with GI bleed, ETOH abuse.  9/28 - 10/2 admit: CT with PE > Eliquis starting 9/30.  10/11EGD mod mid & distal erosive esophagitis  - Last dose Eliquis noted 10/9 at 9am; aPTT ordered for 10/10 am was 39 - question if patient compliant with Eliquis PTA   Goal of Therapy:  Heparin level 0.3-0.7 units/ml aPTT 66-102 seconds Monitor platelets by anticoagulation protocol: Yes   Plan:  Begin Heparin at 1400 units/hr, no load Check aPTT and Hep level in 6 hr after infusion started ~ 8pm Daily CBC Order Daily hep level  and/or aPTT as appropriate   Minda Ditto PharmD 08/16/2019,11:33 AM
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
## 2019-08-16 NOTE — Progress Notes (Signed)
[facility]                                                                              Patient Demographics  Richard Lopez, is a 43 y.o. male, DOB - 01/11/1976, ZOX:[id number]  Admit date - 08/13/2019   Admitting Physician Richard SalterPranav M Patel, MD  Outpatient Primary MD for the patient is Richard SessionsEdwards, Michelle P, NP  Outpatient specialists:   LOS - 2  days   Medical records reviewed and are as summarized below:    Chief Complaint  Patient presents with   Shortness of Breath       Brief summary   Patient is a 43 year old male with history of alcohol abuse, recurrent falls with multiple fractures, diet-controlled diabetes, hypertension, marijuana use, recent admission for pneumonia, PE on anticoagulation, was discharged on 10/2 on Eliquis.  Patient presented with hematemesis, heavy alcohol drinker, drank 40 ounces of beer on the day of admission.  After that he started having episodes of nausea, mild abdominal pain in the epigastric region and then an episode of vomiting with bright red blood.  Subsequently patient had few more episodes of hematemesis.  Denied any hematochezia or melena. Patient was admitted for further work-up  Assessment & Plan    Principal problem Acute upper GI bleed in the setting of anticoagulation, alcohol abuse secondary to mid and distal erosive ulcerative esophagitis -Hemoglobin dropped from 12.9 to 10.4 -Patient was placed on IV Protonix drip, GI was consulted -Patient underwent EGD on 10/11 which showed moderate mid and distal erosive ulcerative esophagitis -H&H currently stable, GI recommended PPI 40 mg twice daily x 4 weeks, then daily  -Plan was to start Eliquis today however patient reported 1 episode of hemoptysis earlier this morning -I also strongly counseled patient for alcohol cessation given his recent falls, anticoagulation, however I doubt his compliance.     Hemoptysis in the setting of recent acute PE and cavitary  lesion/aspiration pneumonia - Patient was treated with IV Unasyn and oral Augmentin on discharge, completed on 08/12/2019 - CT chest on 9/28 had shown cavitary lesion in the posterior segment right lower lobe, question cavitary pneumonia versus cavitary neoplasm.  -Plan was to restart Eliquis today per GI recommendation however patient reported hemoptysis this morning.  Pulmonology was consulted. -Patient seen by Richard Lopez, recommended to start IV heparin and ensure that he tolerates this for 48 hours before he can be switched to oral Eliquis. -He will need repeat CT chest in 8 weeks, in November and follow-up in the pulmonology office.  If he develops more episodes of hemoptysis, fevers, will need to reimage inpatient   History of recent acute pulmonary embolism -Patient was discharged on 10/2 on Eliquis. -Started on IV heparin drip today   History of alcohol abuse -Patient was placed on CIWA protocol scheduled and as needed Ativan -Currently not in acute withdrawals. -Recommended strongly to quit alcohol due to history of falls, fractures, being on anticoagulation  Diet-controlled diabetes mellitus -CBGs currently stable, continue to monitor  Code Status: Full code DVT Prophylaxis: SCDs Family Communication:  No family at the bedside.  Patient alert and oriented.  Disposition Plan: Started on heparin drip today, if no further hematemesis or hemoptysis, may switch to Eliquis on 10/15 a.m.  Time Spent in minutes     Procedures:  EGD  Consultants:   Gastroenterology Pulmonology  Antimicrobials:   Anti-infectives (From admission, onward)   None         Medications  Scheduled Meds:  Chlorhexidine Gluconate Cloth  6 each Topical Daily   cholecalciferol  2,000 Units Oral BID   folic acid  1 mg Oral Daily   [START ON 08/17/2019] magnesium oxide  400 mg Oral Daily   multivitamin with minerals  1 tablet Oral Daily   pantoprazole  40 mg Oral BID   sodium  chloride flush  3 mL Intravenous Q12H   thiamine  100 mg Oral Daily   Or   thiamine  100 mg Intravenous Daily   Continuous Infusions:  heparin 1,400 Units/hr (08/16/19 1336)   PRN Meds:.acetaminophen **OR** acetaminophen, fentaNYL (SUBLIMAZE) injection, [DISCONTINUED] ondansetron **OR** ondansetron (ZOFRAN) IV, ondansetron, oxyCODONE-acetaminophen      Subjective:   Richard Lopez was seen and examined today.  Low-grade temp last night 100.6 F.  Patient also complained of coughing and one episode of hemoptysis with a clot earlier this morning.  No nausea vomiting or hematemesis at this time.  No chest pain or shortness of breath.  Objective:   Vitals:   08/15/19 2142 08/16/19 0634 08/16/19 1120 08/16/19 1439  BP: 132/85 (!) 142/87 126/90 (!) 139/91  Pulse: 88 87 81 76  Resp: Temp: (!) 100.6 F (38.1 C) 98.2 F (36.8 C)  98.4 F (36.9 C)  TempSrc: Oral Oral  Oral  SpO2: 100% 100% 100% 100%  Weight:      Height:        Intake/Output Summary (Last 24 hours) at 08/16/2019 1717 Last data filed at 08/16/2019 1307 Gross per 24 hour  Intake 2319.98 ml  Output 2450 ml  Net -130.02 ml     Wt Readings from Last 3 Encounters:  08/15/19 97.5 kg  08/01/19 99.8 kg  05/18/19 95.3 kg    Physical Exam  General: Alert and oriented x 3, NAD  Eyes:   HEENT:  Atraumatic, normocephalic  Cardiovascular: S1 S2 clear, RRR. No pedal edema b/l  Respiratory: CTAB, no wheezing, rales or rhonchi  Gastrointestinal: Soft, nontender, nondistended, NBS  Ext: no pedal edema bilaterally, right knee appears to be somewhat swollen, patient reports chronic due to prior fracture  Neuro: no new deficits  Musculoskeletal: No cyanosis, clubbing  Skin: No rashes  Psych: Normal affect and demeanor, alert and oriented x3     Data Reviewed:  I have personally reviewed following labs and imaging studies  Micro Results Recent Results (from the past 240 hour(s))  SARS  Coronavirus 2 by RT PCR (hospital order, performed in [facility] hospital lab)     Status: None   Collection Time: 08/13/19  6:18 AM  Result Value Ref Range Status   SARS Coronavirus 2 NEGATIVE NEGATIVE Final    Comment: (NOTE) If result is NEGATIVE SARS-CoV-2 target nucleic acids are NOT DETECTED. The SARS-CoV-2 RNA is generally detectable in upper and lower  respiratory specimens during the acute phase of infection. The lowest  concentration of SARS-CoV-2 viral copies this assay can detect is 250  copies / mL. A negative result does not preclude SARS-CoV-2 infection  and should not be used as the sole basis for treatment or other  patient management decisions.  A negative result may occur with  improper specimen collection / handling, submission of specimen other  than nasopharyngeal swab, presence of viral mutation(s) within the  areas targeted by this assay, and inadequate number of viral copies  (<250 copies / mL). A negative result must be combined with clinical  observations, patient history, and epidemiological information. If result is POSITIVE SARS-CoV-2 target nucleic acids are DETECTED. The SARS-CoV-2 RNA is generally detectable in upper and lower  respiratory specimens dur ing the acute phase of infection.  Positive  results are indicative of active infection with SARS-CoV-2.  Clinical  correlation with patient history and other diagnostic information is  necessary to determine patient infection status.  Positive results do  not rule out bacterial infection or co-infection with other viruses. If result is PRESUMPTIVE POSTIVE SARS-CoV-2 nucleic acids MAY BE PRESENT.   A presumptive positive result was obtained on the submitted specimen  and confirmed on repeat testing.  While 2019 novel coronavirus  (SARS-CoV-2) nucleic acids may be present in the submitted sample  additional confirmatory testing may be necessary for epidemiological  and / or clinical management purposes   to differentiate between  SARS-CoV-2 and other Sarbecovirus currently known to infect humans.  If clinically indicated additional testing with an alternate test  methodology [phone]) is advised. The SARS-CoV-2 RNA is generally  detectable in upper and lower respiratory sp ecimens during the acute  phase of infection. The expected result is Negative. Fact Sheet for Patients:  BoilerBrush.com.cy Fact Sheet for Healthcare Providers: https://pope.com/ This test is not yet approved or cleared by the [place] FDA and has been authorized for detection and/or diagnosis of SARS-CoV-2 by FDA under an Emergency Use Authorization (EUA).  This EUA will remain in effect (meaning this test can be used) for the duration of the COVID-19 declaration under Section 564(b)(1) of the Act, 21 U.S.C. section 360bbb-3(b)(1), unless the authorization is terminated or revoked sooner. Performed at [facility], [address] [place], [place] [address]   MRSA PCR Screening     Status: None   Collection Time: 08/16/19 12:34 PM   Specimen: Nasal Mucosa; Nasopharyngeal  Result Value Ref Range Status   MRSA by PCR NEGATIVE NEGATIVE Final    Comment:        The GeneXpert MRSA Assay (FDA approved for NASAL specimens only), is one component of a comprehensive MRSA colonization surveillance program. It is not intended to diagnose MRSA infection nor to guide or monitor treatment for MRSA infections. Performed at [facility], [address], [place], [place] [address]     Radiology Reports Dg Chest 2 View  Result Date: 08/01/2019 CLINICAL DATA:  Shortness of breath. Right-sided chest pain. EXAM: CHEST - 2 VIEW COMPARISON:  05/16/2019 FINDINGS: Normal heart size and mediastinal contours. Mild patchy opacity in the right lung base and infrahilar lung. Left lung is clear. No pulmonary edema. No definite pleural effusion.  No pneumothorax. No acute osseous abnormalities are seen. IMPRESSION: Mild patchy opacity in the right lung base, may reflect atelectasis or pneumonia. Electronically Signed   By: Narda Rutherford M.D.   On: 08/01/2019 02:20   Ct Angio Chest Pe W And/or Wo Contrast  Result Date: 08/01/2019 CLINICAL DATA:  Shortness of breath and chest pain.  Fever. EXAM: CT ANGIOGRAPHY CHEST WITH CONTRAST TECHNIQUE: Multidetector CT imaging of the chest was performed using the standard protocol during bolus administration of intravenous contrast. Multiplanar CT image reconstructions and MIPs were obtained to evaluate  the vascular anatomy. CONTRAST:  OMNIPAQUE IOHEXOL 350 MG/ML SOLN COMPARISON:  Chest radiograph August 01, 2019 FINDINGS: Cardiovascular: There are multiple pulmonary emboli involving multiple lower lobe pulmonary artery branches bilaterally. There is a slightly greater degree of pulmonary embolic change in the right lower lobe and left lower lobe. There is an incompletely obstructing pulmonary embolus in the posterior segment right upper lobe pulmonary artery branch. No central pulmonary embolus is evident. There is no right heart strain; the right ventricle to left ventricle diameter ratio is less than 0.9. There is no demonstrable thoracic aortic aneurysm or dissection. The visualized great vessels appear unremarkable. There are multiple foci of coronary artery calcification. There is no appreciable pericardial effusion or pericardial thickening. Mediastinum/Nodes: Thyroid appears normal. There is a 1.5 x 1.3 cm subcarinal lymph node. There is a right hilar lymph node measuring 1.3 x 1.1 cm. No esophageal lesions are evident. Lungs/Pleura: There is a fairly small right pleural effusion. There is airspace consolidation consistent with pneumonia involving portions of the superior and lateral segments of the right lower lobe. There is a cavitary lesion in the posterior segment of the right lower lobe  measuring 3.8 x 2.6 cm. The lungs elsewhere are clear. Upper Abdomen: Visualized upper abdominal structures appear normal. Musculoskeletal: There is degenerative change in the thoracic spine. No blastic or lytic bone lesions are evident. There are no chest wall lesions. Review of the MIP images confirms the above findings. IMPRESSION: 1.  Multiple pulmonary emboli.  No right heart strain. 2. Cavitary lesion in the posterior segment right lower lobe. Question cavitary pneumonia versus cavitary neoplasm. 3. Fairly small right pleural effusion. Airspace consolidation consistent with pneumonia involving portions of the superior and lateral segments of the right lower lobe. 4. Prominent right hilar and subcarinal lymph nodes. Neoplastic etiology for this adenopathy cannot be excluded. 5.  Multiple foci of coronary artery calcification. Critical Value/emergent results were called by telephone at the time of interpretation on 08/01/2019 at 12:03 pm to providerWYLDER FONDAW , who verbally acknowledged these results. Electronically Signed   By: Bretta Bang III M.D.   On: 08/01/2019 12:05   Dg Chest Port 1 View  Result Date: 08/13/2019 CLINICAL DATA:  Shortness of breath EXAM: PORTABLE CHEST 1 VIEW COMPARISON:  Chest radiograph 08/01/2019 Chest CTA 08/01/2019 FINDINGS: There is increased opacity at the right lung base. Left lung is clear. No pleural effusion or pneumothorax. Normal cardiomediastinal contours. IMPRESSION: Increased opacity at the right lung base, which may indicate pneumonia or pulmonary infarct in the setting of the recent pulmonary embolus. Electronically Signed   By: Deatra Robinson M.D.   On: 08/13/2019 05:36   Vas [place] Lower Extremity Venous (dvt)  Result Date: 08/01/2019  Lower Venous Study Indications: Pain, and Swelling.  Risk Factors: None identified. Anticoagulation: Heparin. Comparison Study: No prior studies. Performing Technologist: Chanda Busing RVT  Examination Guidelines: A complete  evaluation includes B-mode imaging, spectral Doppler, color Doppler, and power Doppler as needed of all accessible portions of each vessel. Bilateral testing is considered an integral part of a complete examination. Limited examinations for reoccurring indications may be performed as noted.  +---------+---------------+---------+-----------+----------+--------------+  RIGHT     Compressibility Phasicity Spontaneity Properties Thrombus Aging  +---------+---------------+---------+-----------+----------+--------------+  CFV       Full            Yes       Yes                                    +---------+---------------+---------+-----------+----------+--------------+  SFJ       Full                                                             +---------+---------------+---------+-----------+----------+--------------+  FV Prox   Full                                                             +---------+---------------+---------+-----------+----------+--------------+  FV Mid    Full                                                             +---------+---------------+---------+-----------+----------+--------------+  FV Distal Full                                                             +---------+---------------+---------+-----------+----------+--------------+  PFV       Full                                                             +---------+---------------+---------+-----------+----------+--------------+  POP       Partial         No        No                     Acute           +---------+---------------+---------+-----------+----------+--------------+  PTV       None                                             Acute           +---------+---------------+---------+-----------+----------+--------------+  PERO      Full                                                             +---------+---------------+---------+-----------+----------+--------------+  Gastroc   Partial                                           Acute           +---------+---------------+---------+-----------+----------+--------------+   +---------+---------------+---------+-----------+----------+--------------+  LEFT  Compressibility Phasicity Spontaneity Properties Thrombus Aging  +---------+---------------+---------+-----------+----------+--------------+  CFV       Full            Yes       Yes                                    +---------+---------------+---------+-----------+----------+--------------+  SFJ       Full                                                             +---------+---------------+---------+-----------+----------+--------------+  FV Prox   Full                                                             +---------+---------------+---------+-----------+----------+--------------+  FV Mid    Full                                                             +---------+---------------+---------+-----------+----------+--------------+  FV Distal Full                                                             +---------+---------------+---------+-----------+----------+--------------+  PFV       Full                                                             +---------+---------------+---------+-----------+----------+--------------+  POP       Full            Yes       Yes                                    +---------+---------------+---------+-----------+----------+--------------+  PTV       Full                                                             +---------+---------------+---------+-----------+----------+--------------+  PERO      Full                                                             +---------+---------------+---------+-----------+----------+--------------+  Summary: Right: Findings consistent with acute deep vein thrombosis involving the right popliteal vein, right posterior tibial veins, and right gastrocnemius veins. No cystic structure found in the popliteal fossa. Left: There is no evidence of deep vein  thrombosis in the lower extremity. No cystic structure found in the popliteal fossa.  *See table(s) above for measurements and observations. Electronically signed by Ruta Hinds MD on 08/01/2019 at 4:08:12 PM.    Final     Lab Data:  CBC: Recent Labs  Lab 08/13/19 0435  08/13/19 2042 08/14/19 0255 08/14/19 0917 08/15/19 0348 08/16/19 0404  WBC 8.7   < > 8.7 9.0 7.9 8.1 7.5  NEUTROABS 6.0  --   --   --   --   --   --   HGB 12.9*   < > 10.6* 10.0* 11.8* 10.6* 10.0*  HCT 39.3   < > 33.0* 31.5* 36.8* 34.6* 30.2*  MCV 83.6   < > 85.1 85.1 85.2 92.0 83.0  PLT 735*   < > 568* 534* 534* 350 275   < > = values in this interval not displayed.   Basic Metabolic Panel: Recent Labs  Lab 08/13/19 0435 08/14/19 0255 08/15/19 0348 08/16/19 0404 08/16/19 1455  NA 141 139 134* 134*  --   K 3.5 3.9 3.5 2.9* 3.6  CL 101 102 102 99  --   CO2 24 27 22 25   --   GLUCOSE 156* 91 86 137*  --   BUN <5* 6 <5* <5*  --   CREATININE 0.64 0.64 0.51* 0.60*  --   CALCIUM 8.3* 7.6* 7.5* 7.3*  --   MG  --  1.3* 1.1* 0.9* 1.7  PHOS  --   --   --   --  3.2   GFR: Estimated Creatinine Clearance: 155.5 mL/min (A) (by C-G formula based on SCr of 0.6 mg/dL (L)). Liver Function Tests: Recent Labs  Lab 08/13/19 0435 08/14/19 0255  AST 23 27  ALT 13 8  ALKPHOS 129* 91  BILITOT 0.2* 1.0  PROT 7.9 6.0*  ALBUMIN 3.0* 2.3*   Recent Labs  Lab 08/13/19 0435  LIPASE 33   No results for input(s): AMMONIA in the last 168 hours. Coagulation Profile: Recent Labs  Lab 08/13/19 1121  INR 1.2   Cardiac Enzymes: No results for input(s): CKTOTAL, CKMB, CKMBINDEX, TROPONINI in the last 168 hours. BNP (last 3 results) No results for input(s): PROBNP in the last 8760 hours. HbA1C: No results for input(s): HGBA1C in the last 72 hours. CBG: Recent Labs  Lab 08/13/19 2224 08/14/19 0420 08/14/19 0905  GLUCAP 77 82 90   Lipid Profile: No results for input(s): CHOL, HDL, LDLCALC, TRIG, CHOLHDL,  LDLDIRECT in the last 72 hours. Thyroid Function Tests: No results for input(s): TSH, T4TOTAL, FREET4, T3FREE, THYROIDAB in the last 72 hours. Anemia Panel: No results for input(s): VITAMINB12, FOLATE, FERRITIN, TIBC, IRON, RETICCTPCT in the last 72 hours. Urine analysis:    Component Value Date/Time   COLORURINE YELLOW 08/13/2019 0325   APPEARANCEUR CLEAR 08/13/2019 0325   LABSPEC 1.013 08/13/2019 0325   PHURINE 5.0 08/13/2019 0325   GLUCOSEU NEGATIVE 08/13/2019 0325   HGBUR SMALL (A) 08/13/2019 0325   BILIRUBINUR NEGATIVE 08/13/2019 0325   KETONESUR NEGATIVE 08/13/2019 0325   PROTEINUR NEGATIVE 08/13/2019 0325   UROBILINOGEN 1.0 07/29/2014 1837   NITRITE NEGATIVE 08/13/2019 0325   LEUKOCYTESUR NEGATIVE 08/13/2019 0325     Macaila Tahir M.D. [facility] 08/16/2019, 5:17 PM  Pager:  [phone] Between 7am to 7pm - call Pager - [phone]  After 7pm go to [email or web address] - password [email or web address]  Call night coverage person covering after 7pm

## 2019-08-17 DIAGNOSIS — E1169 Type 2 diabetes mellitus with other specified complication: Secondary | ICD-10-CM

## 2019-08-17 DIAGNOSIS — E785 Hyperlipidemia, unspecified: Secondary | ICD-10-CM

## 2019-08-17 DIAGNOSIS — K922 Gastrointestinal hemorrhage, unspecified: Secondary | ICD-10-CM

## 2019-08-17 DIAGNOSIS — K920 Hematemesis: Secondary | ICD-10-CM

## 2019-08-17 DIAGNOSIS — Z7901 Long term (current) use of anticoagulants: Secondary | ICD-10-CM

## 2019-08-17 DIAGNOSIS — E876 Hypokalemia: Secondary | ICD-10-CM | POA: Clinically undetermined

## 2019-08-17 DIAGNOSIS — R042 Hemoptysis: Secondary | ICD-10-CM | POA: Diagnosis present

## 2019-08-17 DIAGNOSIS — Z7289 Other problems related to lifestyle: Secondary | ICD-10-CM

## 2019-08-17 LAB — HEPARIN LEVEL (UNFRACTIONATED)
Heparin Unfractionated: 0.51 IU/mL (ref 0.30–0.70)
Heparin Unfractionated: 0.45 IU/mL (ref 0.30–0.70)

## 2019-08-17 LAB — BASIC METABOLIC PANEL
Anion gap: 10 (ref 5–15)
BUN: <5 mg/dL — ABNORMAL LOW (ref 6–20)
CO2: 24 mmol/L (ref 22–32)
Calcium: 7.3 mg/dL — ABNORMAL LOW (ref 8.9–10.3)
Chloride: 98 mmol/L (ref 98–111)
Creatinine, Ser: 0.59 mg/dL — ABNORMAL LOW (ref 0.61–1.24)
GFR calc Af Amer: >60 mL/min (ref >60)
GFR calc non Af Amer: >60 mL/min (ref >60)
Glucose, Bld: 118 mg/dL — ABNORMAL HIGH (ref 70–99)
Potassium: 3.3 mmol/L — ABNORMAL LOW (ref 3.5–5.1)
Sodium: 132 mmol/L — ABNORMAL LOW (ref 135–145)

## 2019-08-17 LAB — CBC
HCT: 29.5 % — ABNORMAL LOW (ref 39.0–52.0)
Hemoglobin: 9.6 g/dL — ABNORMAL LOW (ref 13.0–17.0)
MCH: 27.3 pg (ref 26.0–34.0)
MCHC: 32.5 g/dL (ref 30.0–36.0)
MCV: 83.8 fL (ref 80.0–100.0)
Platelets: 217 10*3/uL (ref 150–400)
RBC: 3.52 MIL/uL — ABNORMAL LOW (ref 4.22–5.81)
RDW: 18 % — ABNORMAL HIGH (ref 11.5–15.5)
WBC: 7 10*3/uL (ref 4.0–10.5)
nRBC: 0 % (ref 0.0–0.2)

## 2019-08-17 LAB — MAGNESIUM: Magnesium: 1.2 mg/dL — ABNORMAL LOW (ref 1.7–2.4)

## 2019-08-17 MED ORDER — POTASSIUM CHLORIDE 20 MEQ PO PACK
40.0000 meq | PACK | Freq: Once | ORAL | Status: AC
  Administered 2019-08-17: 10:00:00 40 meq via ORAL
  Filled 2019-08-17: qty 2

## 2019-08-17 MED ORDER — MAGNESIUM SULFATE 50 % IJ SOLN: 1.0000 g | INTRAMUSCULAR | Status: DC

## 2019-08-17 MED ORDER — MAGNESIUM SULFATE IN D5W 1-5 GM/100ML-% IV SOLN
1.0000 g | INTRAVENOUS | Status: AC
  Administered 2019-08-17 (×2): 1 g via INTRAVENOUS
  Filled 2019-08-17 (×2): qty 100

## 2019-08-17 NOTE — Progress Notes (Signed)
ANTICOAGULATION CONSULT NOTE - follow up  Pharmacy Consult for Heparin Indication: pulmonary embolus  No Known Allergies  Patient Measurements: Height: 6\' 6"  (198.1 cm) Weight: 214 lb 15.2 oz (97.5 kg) IBW/kg (Calculated) : 91.4 Heparin Dosing Weight: 97.5 kg  Vital Signs: Temp: 98.6 F (37 C) (10/14 0553) Temp Source: Oral (10/14 0553) BP: 128/84 (10/14 0553) Pulse Rate: 78 (10/14 0553)  Labs: Recent Labs    08/15/19 0348 08/16/19 0404 08/16/19 1954 08/17/19 0331  HGB 10.6* 10.0*  --  9.6*  HCT 34.6* 30.2*  --  29.5*  PLT 350 275  --  217  APTT  --   --  79*  --   HEPARINUNFRC  --   --  0.15* 0.51  CREATININE 0.51* 0.60*  --  0.59*   Estimated Creatinine Clearance: 155.5 mL/min (A) (by C-G formula based on SCr of 0.59 mg/dL (L)).  Medical History: Past Medical History:  Diagnosis Date  . Anxiety   . 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 Depression   . Diabetes mellitus    diet controlled  . Essential hypertension   . Gastritis   . Gout   . Pulmonary embolism (HCC)    Medications:  Scheduled:  . Chlorhexidine Gluconate Cloth  6 each Topical Daily  . cholecalciferol  2,000 Units Oral BID  . folic acid  1 mg Oral Daily  . magnesium oxide  400 mg Oral Daily  . multivitamin with minerals  1 tablet Oral Daily  . pantoprazole  40 mg Oral BID  . sodium chloride flush  3 mL Intravenous Q12H  . thiamine  100 mg Oral Daily   Or  . thiamine  100 mg Intravenous Daily   Infusions:  . heparin 1,800 Units/hr (08/17/19 0600)    Assessment: 77 yoM admit 10/10 with GI bleed, ETOH abuse.  9/28 - 10/2 admit: CT with PE > Eliquis started 9/30.  10/11 EGD mod mid & distal erosive esophagitis - Last dose Eliquis noted 10/9 at 9am; aPTT ordered for 10/10 am was 39 - question if patient compliant with Eliquis PTA  Heparin started at 1400 units/hr, no bolus, first hep level low (0.15) Bolus 3000 units, rate increased to 1800 units/hr, level to 0.51,    08/17/19  Heparin level 0.45 units/ml on IV heparin at 1800 units/h  CBC: Hg low and trending down, Pltc WNL but also trending down  No bleeding or infusion related issued reported by nursing  Goal of Therapy:  Heparin level 0.3-0.7 Monitor platelets by anticoagulation protocol: Yes   Plan:   Continue IV heparin at 1800 units/hr  Daily Heparin level & CBC while on heparin  F/U plan to resume Eliquis    Minda Ditto PharmD 08/17/2019,6:27 AM

## 2019-08-17 NOTE — Progress Notes (Signed)
ANTICOAGULATION CONSULT NOTE - follow up  Pharmacy Consult for Heparin Indication: pulmonary embolus  No Known Allergies  Patient Measurements: Height: 6\' 6"  (198.1 cm) Weight: 214 lb 15.2 oz (97.5 kg) IBW/kg (Calculated) : 91.4 Heparin Dosing Weight: 97.5 kg  Vital Signs: Temp: 98.5 F (36.9 C) (10/13 2126) Temp Source: Oral (10/13 2126) BP: 134/84 (10/13 2126) Pulse Rate: 78 (10/13 2126)  Labs: Recent Labs    08/15/19 0348 08/16/19 0404 08/16/19 1954 08/17/19 0331  HGB 10.6* 10.0*  --  9.6*  HCT 34.6* 30.2*  --  29.5*  PLT 350 275  --  217  APTT  --   --  79*  --   HEPARINUNFRC  --   --  0.15* 0.51  CREATININE 0.51* 0.60*  --   --    Estimated Creatinine Clearance: 155.5 mL/min (A) (by C-G formula based on SCr of 0.6 mg/dL (L)).  Medical History: Past Medical History:  Diagnosis Date  . Anxiety   . 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 Depression   . Diabetes mellitus    diet controlled  . Essential hypertension   . Gastritis   . Gout   . Pulmonary embolism (HCC)    Medications:  Scheduled:  . Chlorhexidine Gluconate Cloth  6 each Topical Daily  . cholecalciferol  2,000 Units Oral BID  . folic acid  1 mg Oral Daily  . magnesium oxide  400 mg Oral Daily  . multivitamin with minerals  1 tablet Oral Daily  . pantoprazole  40 mg Oral BID  . sodium chloride flush  3 mL Intravenous Q12H  . thiamine  100 mg Oral Daily   Or  . thiamine  100 mg Intravenous Daily   Infusions:  . heparin 1,800 Units/hr (08/17/19 0336)    Assessment: 34 yoM admit 10/10 with GI bleed, ETOH abuse.  9/28 - 10/2 admit: CT with PE > Eliquis started 9/30.  10/11 EGD mod mid & distal erosive esophagitis  - Last dose Eliquis noted 10/9 at 9am; aPTT ordered for 10/10 am was 39 - question if patient compliant with Eliquis PTA  08/17/19  Heparin level therapeutic (0.51) on IV heparin at 1800 units/h  CBC: Hg low and trending down, Pltc WNL but also trending down  No  bleeding or infusion related issued reported by nursing  Goal of Therapy:  Heparin level 0.3-0.7 Monitor platelets by anticoagulation protocol: Yes   Plan:   Continue IV heparin at 1800 units/hr  Recheck heparin level in 6 hours to confirm at steady-state  Daily Heparin level & CBC while on heparin  F/U plan to resume Eliquis - noted likely 10/15 if no further bleeding   Netta Cedars PharmD, BCPS 08/17/2019,4:06 AM

## 2019-08-17 NOTE — Progress Notes (Signed)
[facility]  PROGRESS NOTE  Richard Lopez TDS:[id number] DOB: [birth year] DOA: 08/13/2019 PCP: [name], NP  Brief History    Richard Lopez is a 43 y.o. year old male with medical history significant for recent diagnosis of multiple lower lobe PEs in the setting of decreased ability related to lower extremity fractures on Eliquis, heavy alcohol user who presented on 08/13/2019 with hematemesis after drinking 40 ounces of beer with associated epigastric abdominal pain.  Hemoglobin dropped from 12.9-10.4 Underwent EGD on 10/11 which showed mild esophagitis was appropriate treated with Protonix IV before transition to oral.  Due to development of blood-tinged sputum/mild hemoptysis pulmonary was consulted.  Presume hemoptysis could potentially be related to right lower lobe cavitary lesion seen in CT abdominal imaging on June 2020.  Currently monitoring patient on IV heparin for switching to oral anticoagulation.  A & P    Acute upper GI bleed in setting of anticoagulation, alcohol abuse with hematemesis secondary to erosive ulcerative esophagitis, resolved.  Underwent EGD on 10/11 showing erosive ulcerative esophagitis.  Continue Protonix oral twice daily x4 weeks then daily, biopsies pending will be follow-up as outpatient, alcohol abuse cessation emphasized, continue folic acid, multivitamin, thiamine   Hemoptysis, small-volume, intermittent in setting of recent PE and possible cavitary lesion/aspiration pneumonia.  Seems to be improving only one episode overnight very small volume.  PCCM recommends monitoring on IV heparin x 8 hours before transitioning to oral anticoagulation.  If hemoptysis worsens or develops unexplained fevers would benefit from reimaging of lungs.  Will need outpatient follow-up and CT scan   Acute PE/DVT, 9/28.  Provoked in the setting of lower extremity fractures will need to do 6 months of oral anticoagulation   Hypokalemia/hypomagnesemia.  Likely due to  nutritional deficits in setting of alcohol abuse.  Replete, repeat in a.m.   History of alcohol abuse.  Monitoring on CIWA protocol no active signs of withdrawal currently.  Alcohol cessation strongly emphasized during hospital stay.   Type 2 diabetes, diet controlled, monitor CBGs.     DVT prophylaxis: IV heparin Code Status: Full code Family Communication: No family at bedside Disposition Plan: Continue day 2 of IV heparin, plan to transition to oral anticoagulation hemoptysis continues to remain stable likely discharge in a.m.     [facility] Direct contact: see [email or web address] (further directions at bottom of note if needed) 7PM-7AM contact night coverage as at bottom of note 08/17/2019, 6:47 PM  LOS: 3 days   Consultants  . GI, PCCM  Procedures  . EGD 10/11  Antibiotics  . None  Interval History/Subjective  Feeling fine Reports small amount of blood coughed up overnight Denies any belly pain Eating well  Objective   Vitals:  Vitals:   08/17/19 1414 08/17/19 1521  BP: 127/85   Pulse: 65   Resp: 18   Temp: 98 F (36.7 C)   SpO2: 100% 100%    Exam:  Awake Alert, Oriented X 3, No new F.N deficits, Normal affect [place].AT No JVD, no peripheral edema Regular rate and rhythm, no rubs murmurs or gallops Abdomen soft, nondistended, nontender    I have personally reviewed the following:   Data Reviewed: Basic Metabolic Panel: Recent Labs  Lab 08/13/19 0435 08/14/19 0255 08/15/19 0348 08/16/19 0404 08/16/19 1455 08/17/19 0331  NA 141 139 134* 134*  --  132*  K 3.5 3.9 3.5 2.9* 3.6 3.3*  CL 101 102 102 99  --  98  CO2 24 27 22 25   --  24  GLUCOSE 156* 91 86 137*  --  118*  BUN <5* 6 <5* <5*  --  <5*  CREATININE 0.64 0.64 0.51* 0.60*  --  0.59*  CALCIUM 8.3* 7.6* 7.5* 7.3*  --  7.3*  MG  --  1.3* 1.1* 0.9* 1.7 1.2*  PHOS  --   --   --   --  3.2  --    Liver Function Tests: Recent Labs  Lab 08/13/19 0435 08/14/19 0255  AST 23 27  ALT 13 8   ALKPHOS 129* 91  BILITOT 0.2* 1.0  PROT 7.9 6.0*  ALBUMIN 3.0* 2.3*   Recent Labs  Lab 08/13/19 0435  LIPASE 33   No results for input(s): AMMONIA in the last 168 hours. CBC: Recent Labs  Lab 08/13/19 0435  08/14/19 0255 08/14/19 0917 08/15/19 0348 08/16/19 0404 08/17/19 0331  WBC 8.7   < > 9.0 7.9 8.1 7.5 7.0  NEUTROABS 6.0  --   --   --   --   --   --   HGB 12.9*   < > 10.0* 11.8* 10.6* 10.0* 9.6*  HCT 39.3   < > 31.5* 36.8* 34.6* 30.2* 29.5*  MCV 83.6   < > 85.1 85.2 92.0 83.0 83.8  PLT 735*   < > 534* 534* 350 275 217   < > = values in this interval not displayed.   Cardiac Enzymes: No results for input(s): CKTOTAL, CKMB, CKMBINDEX, TROPONINI in the last 168 hours. BNP (last 3 results) No results for input(s): BNP in the last 8760 hours.  ProBNP (last 3 results) No results for input(s): PROBNP in the last 8760 hours.  CBG: Recent Labs  Lab 08/13/19 2224 08/14/19 0420 08/14/19 0905  GLUCAP 77 82 90    Recent Results (from the past 240 hour(s))  SARS Coronavirus 2 by RT PCR (hospital order, performed in [facility] hospital lab)     Status: None   Collection Time: 08/13/19  6:18 AM  Result Value Ref Range Status   SARS Coronavirus 2 NEGATIVE NEGATIVE Final    Comment: (NOTE) If result is NEGATIVE SARS-CoV-2 target nucleic acids are NOT DETECTED. The SARS-CoV-2 RNA is generally detectable in upper and lower  respiratory specimens during the acute phase of infection. The lowest  concentration of SARS-CoV-2 viral copies this assay can detect is 250  copies / mL. A negative result does not preclude SARS-CoV-2 infection  and should not be used as the sole basis for treatment or other  patient management decisions.  A negative result may occur with  improper specimen collection / handling, submission of specimen other  than nasopharyngeal swab, presence of viral mutation(s) within the  areas targeted by this assay, and inadequate number of viral copies   (<250 copies / mL). A negative result must be combined with clinical  observations, patient history, and epidemiological information. If result is POSITIVE SARS-CoV-2 target nucleic acids are DETECTED. The SARS-CoV-2 RNA is generally detectable in upper and lower  respiratory specimens dur ing the acute phase of infection.  Positive  results are indicative of active infection with SARS-CoV-2.  Clinical  correlation with patient history and other diagnostic information is  necessary to determine patient infection status.  Positive results do  not rule out bacterial infection or co-infection with other viruses. If result is PRESUMPTIVE POSTIVE SARS-CoV-2 nucleic acids MAY BE PRESENT.   A presumptive positive result was obtained on the submitted specimen  and confirmed on repeat testing.  While 2019 novel coronavirus  (SARS-CoV-2) nucleic acids may be present in the submitted sample  additional confirmatory testing may be necessary for epidemiological  and / or clinical management purposes  to differentiate between  SARS-CoV-2 and other Sarbecovirus currently known to infect humans.  If clinically indicated additional testing with an alternate test  methodology [phone]) is advised. The SARS-CoV-2 RNA is generally  detectable in upper and lower respiratory sp ecimens during the acute  phase of infection. The expected result is Negative. Fact Sheet for Patients:  StrictlyIdeas.no Fact Sheet for Healthcare Providers: BankingDealers.co.za This test is not yet approved or cleared by the [place] FDA and has been authorized for detection and/or diagnosis of SARS-CoV-2 by FDA under an Emergency Use Authorization (EUA).  This EUA will remain in effect (meaning this test can be used) for the duration of the COVID-19 declaration under Section 564(b)(1) of the Act, 21 U.S.C. section 360bbb-3(b)(1), unless the authorization is terminated or  revoked sooner. Performed at [facility], [place] [address], [place], [place] [address]   MRSA PCR Screening     Status: None   Collection Time: 08/16/19 12:34 PM   Specimen: Nasal Mucosa; Nasopharyngeal  Result Value Ref Range Status   MRSA by PCR NEGATIVE NEGATIVE Final    Comment:        The GeneXpert MRSA Assay (FDA approved for NASAL specimens only), is one component of a comprehensive MRSA colonization surveillance program. It is not intended to diagnose MRSA infection nor to guide or monitor treatment for MRSA infections. Performed at [facility], [place] [address] [place], [place] [address]      Studies: No results found.  Scheduled Meds: . Chlorhexidine Gluconate Cloth  6 each Topical Daily  . cholecalciferol  2,000 Units Oral BID  . folic acid  1 mg Oral Daily  . magnesium oxide  400 mg Oral Daily  . multivitamin with minerals  1 tablet Oral Daily  . pantoprazole  40 mg Oral BID  . sodium chloride flush  3 mL Intravenous Q12H  . thiamine  100 mg Oral Daily   Or  . thiamine  100 mg Intravenous Daily   Continuous Infusions: . heparin 1,800 Units/hr (08/17/19 1834)    Active Problems:   Hematemesis   Acute upper GI bleed      Desiree Hane  [facility]

## 2019-08-18 LAB — CBC
HCT: 30.3 % — ABNORMAL LOW (ref 39.0–52.0)
Hemoglobin: 9.7 g/dL — ABNORMAL LOW (ref 13.0–17.0)
MCH: 27.3 pg (ref 26.0–34.0)
MCHC: 32 g/dL (ref 30.0–36.0)
MCV: 85.4 fL (ref 80.0–100.0)
Platelets: 215 10*3/uL (ref 150–400)
RBC: 3.55 MIL/uL — ABNORMAL LOW (ref 4.22–5.81)
RDW: 18 % — ABNORMAL HIGH (ref 11.5–15.5)
WBC: 5.1 10*3/uL (ref 4.0–10.5)
nRBC: 0 % (ref 0.0–0.2)

## 2019-08-18 LAB — BASIC METABOLIC PANEL
Anion gap: 10 (ref 5–15)
BUN: 6 mg/dL (ref 6–20)
CO2: 23 mmol/L (ref 22–32)
Calcium: 7.7 mg/dL — ABNORMAL LOW (ref 8.9–10.3)
Chloride: 99 mmol/L (ref 98–111)
Creatinine, Ser: 0.62 mg/dL (ref 0.61–1.24)
GFR calc Af Amer: >60 mL/min (ref >60)
GFR calc non Af Amer: >60 mL/min (ref >60)
Glucose, Bld: 129 mg/dL — ABNORMAL HIGH (ref 70–99)
Potassium: 3.2 mmol/L — ABNORMAL LOW (ref 3.5–5.1)
Sodium: 132 mmol/L — ABNORMAL LOW (ref 135–145)

## 2019-08-18 LAB — HEPARIN LEVEL (UNFRACTIONATED): Heparin Unfractionated: 0.41 IU/mL (ref 0.30–0.70)

## 2019-08-18 LAB — MAGNESIUM: Magnesium: 1.4 mg/dL — ABNORMAL LOW (ref 1.7–2.4)

## 2019-08-18 MED ORDER — PANTOPRAZOLE SODIUM 40 MG PO TBEC: 40.0000 mg | DELAYED_RELEASE_TABLET | Freq: Two times a day (BID) | ORAL | 1 refills | Status: DC

## 2019-08-18 MED ORDER — APIXABAN 5 MG PO TABS
5.0000 mg | ORAL_TABLET | Freq: Two times a day (BID) | ORAL | Status: DC
  Administered 2019-08-18: 5 mg via ORAL
  Filled 2019-08-18: qty 1

## 2019-08-18 MED ORDER — MAGNESIUM SULFATE 2 GM/50ML IV SOLN
2.0000 g | Freq: Once | INTRAVENOUS | Status: AC
  Administered 2019-08-18: 11:00:00 2 g via INTRAVENOUS
  Filled 2019-08-18: qty 50

## 2019-08-18 MED ORDER — MAGNESIUM OXIDE 400 (241.3 MG) MG PO TABS: 400.0000 mg | ORAL_TABLET | Freq: Every day | ORAL | 0 refills | Status: DC

## 2019-08-18 MED ORDER — POTASSIUM CHLORIDE CRYS ER 20 MEQ PO TBCR
40.0000 meq | EXTENDED_RELEASE_TABLET | ORAL | Status: DC
  Administered 2019-08-18: 40 meq via ORAL
  Filled 2019-08-18: qty 2

## 2019-08-18 MED ORDER — POTASSIUM CHLORIDE 20 MEQ PO PACK
40.0000 meq | PACK | ORAL | Status: DC
  Filled 2019-08-18 (×2): qty 2

## 2019-08-18 NOTE — Discharge Instructions (Signed)
°  Follow up appointment at [facility] for follow up care and establishing primary care.  08/22/19 9:30am.  Continue taking your eliquis,You will need to apply for Eliquis patient assistance program at pharmacy to investigate future financial assistance for this medication.  Keep taking Protonix 40 mg twice a day for four weeks    Information on my medicine - ELIQUIS (apixaban)  This medication education was reviewed with me or my healthcare representative as part of my discharge preparation.  The student pharmacist that spoke with me during my hospital stay was:  Bobbye Riggs, Student-PharmD  Why was Eliquis prescribed for you? Eliquis was prescribed to treat blood clots that may have been found in the veins of your legs (deep vein thrombosis) or in your lungs (pulmonary embolism) and to reduce the risk of them occurring again.  What do You need to know about Eliquis ? Take ONE 5 mg tablet TWICE daily.  Eliquis may be taken with or without food.   Try to take the dose about the same time in the morning and in the evening. If you have difficulty swallowing the tablet whole please discuss with your pharmacist how to take the medication safely.  Take Eliquis exactly as prescribed and DO NOT stop taking Eliquis without talking to the doctor who prescribed the medication.  Stopping may increase your risk of developing a new blood clot.  Refill your prescription before you run out.  After discharge, you should have regular check-up appointments with your healthcare provider that is prescribing your Eliquis.    What do you do if you miss a dose? If a dose of ELIQUIS is not taken at the scheduled time, take it as soon as possible on the same day and twice-daily administration should be resumed. The dose should not be doubled to make up for a missed dose.  Important Safety Information A possible side effect of Eliquis is bleeding. You should call your healthcare provider right  away if you experience any of the following: ? Bleeding from an injury or your nose that does not stop. ? Unusual colored urine (red or dark brown) or unusual colored stools (red or black). ? Unusual bruising for unknown reasons. ? A serious fall or if you hit your head (even if there is no bleeding).  Some medicines may interact with Eliquis and might increase your risk of bleeding or clotting while on Eliquis. To help avoid this, consult your healthcare provider or pharmacist prior to using any new prescription or non-prescription medications, including herbals, vitamins, non-steroidal anti-inflammatory drugs (NSAIDs) and supplements.  This website has more information on Eliquis (apixaban): http://www.eliquis.com/eliquis/home

## 2019-08-18 NOTE — Discharge Summary (Signed)
[name] DVV:[id number] DOB: [birth year] DOA: 08/13/2019  PCP: [name], NP  Admit date: 08/13/2019 Discharge date: 08/18/2019  Admitted From: Home Disposition: Home  Recommendations for Outpatient Follow-up:  1. Follow up with PCP in 1-2 weeks 2. Please obtain BMP/CBC in one week 3. New medications: Protonix 40 mg BID x 4 weeks 4. Continue taking eliquis. Emphasized importance of getting patient assitance program for eliquis at outpatient phrarmacy, already provided voucher for 30-day prescription 5. Please follow up on the following pending results:  Home Health: No Equipment/Devices: None  Discharge Condition: Stable CODE STATUS: Full Diet recommendation: Heart Healthy  Brief/Interim Summary: History of present illness:  Richard Lopez is a 43 y.o. year old male with medical history significant for recent diagnosis of multiple lower lobe PEs in the setting of decreased ability related to lower extremity fractures on Eliquis, heavy alcohol user who presented on 08/13/2019 with hematemesis after drinking 40 ounces of beer with associated epigastric abdominal pain.  Remaining hospital course addressed in problem based format below:   Hospital Course:    Acute upper GI bleed in setting of anticoagulation, alcohol abuse with hematemesis secondary to erosive ulcerative esophagitis, resolved.  Hemoglobin dropped from 12.9-10.4,Underwent EGD on 10/11 showing erosive ulcerative esophagitis with no bleeding.  Treated with IV Protonix before transition to oral Protonix which she will continue at 40 mg twice daily x4 weeks then daily per GI consultants, biopsies pending will be follow-up as outpatient, alcohol abuse cessation emphasized, continue folic acid, multivitamin, thiamine    Acute on chronic normocytic anemia in setting of upper GI bleed.  Hemoglobin at baseline 11-12.  Did not require transfusion during hospital stayHemoglobin remained stable at 9.5-10.5.  Bleeding  secondary to ulcerative esophagitis as mentioned above.  Continue Protonix on discharge   Hemoptysis, small-volume, intermittent in setting of recent PE and possible cavitary lesion/aspiration pneumonia.    Monitoring for 48 hours on IV heparin with resolution of hemoptysis and tolerated transition to Eliquis without repeat episodes.  Will need outpatient follow-up and CT scan   Acute PE/DVT, diagnosed during previous hospitalization on 9/28.  Provoked in the setting of lower extremity fractures, will need to do 6 months of oral anticoagulation.  Provided 30-day voucher for Eliquis, instructed to follow-up with pharmacy for outpatient assistance   Hypokalemia/hypomagnesemia.  Likely due to nutritional deficits in setting of alcohol abuse.  Repleted during hospital stay   History of alcohol abuse.    Monitored on CIWA protocol, had no signs of withdrawal during hospital stay.   Alcohol cessation strongly emphasized during hospital stay.   Type 2 diabetes, diet controlled   Consultations:  PCCM, GI  Procedures/Studies: EGD, 10/11 Subjective: No overnight episodes of hemoptysis or hematemesis No abdominal pain Walking the halls without dyspnea Ready to go home Discharge Exam: Vitals:   08/18/19 0526 08/18/19 1337  BP: 126/73 126/87  Pulse: 75 73  Resp: 15 18  Temp: 98.4 F (36.9 C)   SpO2: 99% 100%   Vitals:   08/17/19 2044 08/18/19 0526 08/18/19 1300 08/18/19 1337  BP: 128/70 126/73  126/87  Pulse: 74 75  73  Resp: 15 15  18   Temp: 98.8 F (37.1 C) 98.4 F (36.9 C)    TempSrc: Oral Oral    SpO2: 100% 99%  100%  Weight:   95.7 kg   Height:       Awake Alert, Oriented X 3, No new F.N deficits, Normal affect Richard Lopez No JVD, no peripheral edema Regular  rate and rhythm, no rubs murmurs or gallops Abdomen soft, nondistended, nontender No skin rashes No focal deficits MCP joint swelling in bilateral hands, chronic   Discharge Diagnoses:  Active Problems:    Type 2 diabetes mellitus with hyperlipidemia (HCC)   Alcohol use   Cavitary lesion of lung   Hematemesis   Acute upper GI bleed   Hypokalemia   Hypomagnesemia   Hemoptysis    Discharge Instructions  Discharge Instructions    Diet - low sodium heart healthy   Complete by: As directed    Increase activity slowly   Complete by: As directed      Allergies as of 08/18/2019   No Known Allergies     Medication List    STOP taking these medications   amoxicillin-clavulanate 875-125 MG tablet Commonly known as: AUGMENTIN     TAKE these medications   cholecalciferol 25 MCG (1000 UT) tablet Commonly known as: VITAMIN D3 Take 2 tablets (2,000 Units total) by mouth 2 (two) times daily.   Eliquis DVT/PE Starter Pack 5 MG Tabs Take as directed on package: start with two-5mg  tablets twice daily for 7 days. On day 8, switch to one-5mg  tablet twice daily.   guaiFENesin 600 MG 12 hr tablet Commonly known as: MUCINEX Take 600 mg by mouth 2 (two) times daily as needed for cough or to loosen phlegm.   magnesium oxide 400 (241.3 Mg) MG tablet Commonly known as: MAG-OX Take 1 tablet (400 mg total) by mouth daily. Start taking on: August 19, 2019   multivitamin with minerals Tabs tablet Take 1 tablet by mouth daily.   pantoprazole 40 MG tablet Commonly known as: PROTONIX Take 1 tablet (40 mg total) by mouth 2 (two) times daily.   thiamine 100 MG tablet Take 1 tablet (100 mg total) by mouth daily.      Follow-up Information    [facility]. Go to.   Specialty: Family Medicine Why: Go to clinic on 08/22/19 9:30am  Contact information: [address] [place] [address] [phone]         No Known Allergies      The results of significant diagnostics from this hospitalization (including imaging, microbiology, ancillary and laboratory) are listed below for reference.     Microbiology: Recent Results (from the past  240 hour(s))  SARS Coronavirus 2 by RT PCR (hospital order, performed in [place] hospital lab)     Status: None   Collection Time: 08/13/19  6:18 AM  Result Value Ref Range Status   SARS Coronavirus 2 NEGATIVE NEGATIVE Final    Comment: (NOTE) If result is NEGATIVE SARS-CoV-2 target nucleic acids are NOT DETECTED. The SARS-CoV-2 RNA is generally detectable in upper and lower  respiratory specimens during the acute phase of infection. The lowest  concentration of SARS-CoV-2 viral copies this assay can detect is 250  copies / mL. A negative result does not preclude SARS-CoV-2 infection  and should not be used as the sole basis for treatment or other  patient management decisions.  A negative result may occur with  improper specimen collection / handling, submission of specimen other  than nasopharyngeal swab, presence of viral mutation(s) within the  areas targeted by this assay, and inadequate number of viral copies  (<250 copies / mL). A negative result must be combined with clinical  observations, patient history, and epidemiological information. If result is POSITIVE SARS-CoV-2 target nucleic acids are DETECTED. The SARS-CoV-2 RNA is generally detectable in  upper and lower  respiratory specimens dur ing the acute phase of infection.  Positive  results are indicative of active infection with SARS-CoV-2.  Clinical  correlation with patient history and other diagnostic information is  necessary to determine patient infection status.  Positive results do  not rule out bacterial infection or co-infection with other viruses. If result is PRESUMPTIVE POSTIVE SARS-CoV-2 nucleic acids MAY BE PRESENT.   A presumptive positive result was obtained on the submitted specimen  and confirmed on repeat testing.  While 2019 novel coronavirus  (SARS-CoV-2) nucleic acids may be present in the submitted sample  additional confirmatory testing may be necessary for epidemiological  and / or clinical  management purposes  to differentiate between  SARS-CoV-2 and other Sarbecovirus currently known to infect humans.  If clinically indicated additional testing with an alternate test  methodology [phone]([id number]) is advised. The SARS-CoV-2 RNA is generally  detectable in upper and lower respiratory sp ecimens during the acute  phase of infection. The expected result is Negative. Fact Sheet for Patients:  BoilerBrush.com.cyhttps://www.fda.gov/media/136312/download Fact Sheet for Healthcare Providers: https://pope.com/https://www.fda.gov/media/136313/download This test is not yet approved or cleared by the [place] FDA and has been authorized for detection and/or diagnosis of SARS-CoV-2 by FDA under an Emergency Use Authorization (EUA).  This EUA will remain in effect (meaning this test can be used) for the duration of the COVID-19 declaration under Section 564(b)(1) of the Act, 21 U.S.C. section 360bbb-3(b)(1), unless the authorization is terminated or revoked sooner. Performed at [facility], [address] [place], [place] [address]   MRSA PCR Screening     Status: None   Collection Time: 08/16/19 12:34 PM   Specimen: Nasal Mucosa; Nasopharyngeal  Result Value Ref Range Status   MRSA by PCR NEGATIVE NEGATIVE Final    Comment:        The GeneXpert MRSA Assay (FDA approved for NASAL specimens only), is one component of a comprehensive MRSA colonization surveillance program. It is not intended to diagnose MRSA infection nor to guide or monitor treatment for MRSA infections. Performed at [facility], [address] [place], [place] [address]      Labs: BNP (last 3 results) No results for input(s): BNP in the last 8760 hours. Basic Metabolic Panel: Recent Labs  Lab 08/14/19 0255 08/15/19 0348 08/16/19 0404 08/16/19 1455 08/17/19 0331 08/18/19 0307  NA 139 134* 134*  --  132* 132*  K 3.9 3.5 2.9* 3.6 3.3* 3.2*  CL 102 102 99  --  98 99  CO2 27 22 25   --  24 23   GLUCOSE 91 86 137*  --  118* 129*  BUN 6 <5* <5*  --  <5* 6  CREATININE 0.64 0.51* 0.60*  --  0.59* 0.62  CALCIUM 7.6* 7.5* 7.3*  --  7.3* 7.7*  MG 1.3* 1.1* 0.9* 1.7 1.2* 1.4*  PHOS  --   --   --  3.2  --   --    Liver Function Tests: Recent Labs  Lab 08/13/19 0435 08/14/19 0255  AST 23 27  ALT 13 8  ALKPHOS 129* 91  BILITOT 0.2* 1.0  PROT 7.9 6.0*  ALBUMIN 3.0* 2.3*   Recent Labs  Lab 08/13/19 0435  LIPASE 33   No results for input(s): AMMONIA in the last 168 hours. CBC: Recent Labs  Lab 08/13/19 0435  08/14/19 0917 08/15/19 0348 08/16/19 0404 08/17/19 0331 08/18/19 0307  WBC 8.7   < > 7.9 8.1 7.5  7.0 5.1  NEUTROABS 6.0  --   --   --   --   --   --   HGB 12.9*   < > 11.8* 10.6* 10.0* 9.6* 9.7*  HCT 39.3   < > 36.8* 34.6* 30.2* 29.5* 30.3*  MCV 83.6   < > 85.2 92.0 83.0 83.8 85.4  PLT 735*   < > 534* 350 275 217 215   < > = values in this interval not displayed.   Cardiac Enzymes: No results for input(s): CKTOTAL, CKMB, CKMBINDEX, TROPONINI in the last 168 hours. BNP: Invalid input(s): POCBNP CBG: Recent Labs  Lab 08/13/19 2224 08/14/19 0420 08/14/19 0905  GLUCAP 77 82 90   D-Dimer No results for input(s): DDIMER in the last 72 hours. Hgb A1c No results for input(s): HGBA1C in the last 72 hours. Lipid Profile No results for input(s): CHOL, HDL, LDLCALC, TRIG, CHOLHDL, LDLDIRECT in the last 72 hours. Thyroid function studies No results for input(s): TSH, T4TOTAL, T3FREE, THYROIDAB in the last 72 hours.  Invalid input(s): FREET3 Anemia work up No results for input(s): VITAMINB12, FOLATE, FERRITIN, TIBC, IRON, RETICCTPCT in the last 72 hours. Urinalysis    Component Value Date/Time   COLORURINE YELLOW 08/13/2019 0325   APPEARANCEUR CLEAR 08/13/2019 0325   LABSPEC 1.013 08/13/2019 0325   PHURINE 5.0 08/13/2019 0325   GLUCOSEU NEGATIVE 08/13/2019 0325   HGBUR SMALL (A) 08/13/2019 0325   BILIRUBINUR NEGATIVE 08/13/2019 0325   KETONESUR NEGATIVE  08/13/2019 0325   PROTEINUR NEGATIVE 08/13/2019 0325   UROBILINOGEN 1.0 07/29/2014 1837   NITRITE NEGATIVE 08/13/2019 0325   LEUKOCYTESUR NEGATIVE 08/13/2019 0325   Sepsis Labs Invalid input(s): PROCALCITONIN,  WBC,  LACTICIDVEN Microbiology Recent Results (from the past 240 hour(s))  SARS Coronavirus 2 by RT PCR (hospital order, performed in [facility] hospital lab)     Status: None   Collection Time: 08/13/19  6:18 AM  Result Value Ref Range Status   SARS Coronavirus 2 NEGATIVE NEGATIVE Final    Comment: (NOTE) If result is NEGATIVE SARS-CoV-2 target nucleic acids are NOT DETECTED. The SARS-CoV-2 RNA is generally detectable in upper and lower  respiratory specimens during the acute phase of infection. The lowest  concentration of SARS-CoV-2 viral copies this assay can detect is 250  copies / mL. A negative result does not preclude SARS-CoV-2 infection  and should not be used as the sole basis for treatment or other  patient management decisions.  A negative result may occur with  improper specimen collection / handling, submission of specimen other  than nasopharyngeal swab, presence of viral mutation(s) within the  areas targeted by this assay, and inadequate number of viral copies  (<250 copies / mL). A negative result must be combined with clinical  observations, patient history, and epidemiological information. If result is POSITIVE SARS-CoV-2 target nucleic acids are DETECTED. The SARS-CoV-2 RNA is generally detectable in upper and lower  respiratory specimens dur ing the acute phase of infection.  Positive  results are indicative of active infection with SARS-CoV-2.  Clinical  correlation with patient history and other diagnostic information is  necessary to determine patient infection status.  Positive results do  not rule out bacterial infection or co-infection with other viruses. If result is PRESUMPTIVE POSTIVE SARS-CoV-2 nucleic acids MAY BE PRESENT.   A  presumptive positive result was obtained on the submitted specimen  and confirmed on repeat testing.  While 2019 novel coronavirus  (SARS-CoV-2) nucleic acids may be present in the submitted  sample  additional confirmatory testing may be necessary for epidemiological  and / or clinical management purposes  to differentiate between  SARS-CoV-2 and other Sarbecovirus currently known to infect humans.  If clinically indicated additional testing with an alternate test  methodology [phone]([id number]) is advised. The SARS-CoV-2 RNA is generally  detectable in upper and lower respiratory sp ecimens during the acute  phase of infection. The expected result is Negative. Fact Sheet for Patients:  BoilerBrush.com.cyhttps://www.fda.gov/media/136312/download Fact Sheet for Healthcare Providers: https://pope.com/https://www.fda.gov/media/136313/download This test is not yet approved or cleared by the [place] FDA and has been authorized for detection and/or diagnosis of SARS-CoV-2 by FDA under an Emergency Use Authorization (EUA).  This EUA will remain in effect (meaning this test can be used) for the duration of the COVID-19 declaration under Section 564(b)(1) of the Act, 21 U.S.C. section 360bbb-3(b)(1), unless the authorization is terminated or revoked sooner. Performed at [facility], [address] [place], [place] [address]   MRSA PCR Screening     Status: None   Collection Time: 08/16/19 12:34 PM   Specimen: Nasal Mucosa; Nasopharyngeal  Result Value Ref Range Status   MRSA by PCR NEGATIVE NEGATIVE Final    Comment:        The GeneXpert MRSA Assay (FDA approved for NASAL specimens only), is one component of a comprehensive MRSA colonization surveillance program. It is not intended to diagnose MRSA infection nor to guide or monitor treatment for MRSA infections. Performed at [facility], [address] [place], [place] [address]      Time coordinating discharge: Over 30  minutes  SIGNED:   Laverna PeaceShayla D Nettey, MD  [facility] 08/18/2019, 1:44 PM Pager   If 7PM-7AM, please contact night-coverage [email or web address] Password [email or web address]

## 2019-08-18 NOTE — Progress Notes (Signed)
Pt alert and oriented, tolerating diet. D/C instructions given, pt d/cd home. 

## 2019-08-18 NOTE — Progress Notes (Signed)
Pt's oxygen was between 90 and 100% while walking. Heart was between 85, to 90's then 125. Pt was not short of breath at anytime while walking.

## 2019-08-18 NOTE — Progress Notes (Signed)
[facility] for Heparin transitioned to Apixaban 10/15 Indication: pulmonary embolus  No Known Allergies  Patient Measurements: Height: 6\' 6"  (198.1 cm) Weight: 214 lb 15.2 oz (97.5 kg) IBW/kg (Calculated) : 91.4 Heparin Dosing Weight: 97.5 kg  Vital Signs: Temp: 98.4 F (36.9 C) (10/15 0526) Temp Source: Oral (10/15 0526) BP: 126/73 (10/15 0526) Pulse Rate: 75 (10/15 0526)  Labs: Recent Labs    08/16/19 0404  08/16/19 1954 08/17/19 0331 08/17/19 1009 08/18/19 0307  HGB 10.0*  --   --  9.6*  --  9.7*  HCT 30.2*  --   --  29.5*  --  30.3*  PLT 275  --   --  217  --  215  APTT  --   --  79*  --   --   --   HEPARINUNFRC  --    < > 0.15* 0.51 0.45 0.41  CREATININE 0.60*  --   --  0.59*  --  0.62   < > = values in this interval not displayed.   Estimated Creatinine Clearance: 155.5 mL/min (by C-G formula based on SCr of 0.62 mg/dL).  Medical History: Past Medical History:  Diagnosis Date  . Anxiety   . 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 Depression   . Diabetes mellitus    diet controlled  . Essential hypertension   . Gastritis   . Gout   . Pulmonary embolism (HCC)    Medications:  Scheduled:  . Chlorhexidine Gluconate Cloth  6 each Topical Daily  . cholecalciferol  2,000 Units Oral BID  . folic acid  1 mg Oral Daily  . magnesium oxide  400 mg Oral Daily  . multivitamin with minerals  1 tablet Oral Daily  . pantoprazole  40 mg Oral BID  . sodium chloride flush  3 mL Intravenous Q12H  . thiamine  100 mg Oral Daily   Or  . thiamine  100 mg Intravenous Daily   Infusions:  . heparin 1,800 Units/hr (08/17/19 1834)    Assessment: 42 yoM admit 10/10 with GI bleed, ETOH abuse.  9/28 - 10/2 admit: CT with PE > Eliquis started 9/30.  10/11 EGD mod mid & distal erosive esophagitis - Last dose Eliquis noted 10/9 at 9am; aPTT resulted 10/10 am was 39 - question if patient compliant with Eliquis PTA  Heparin started at 1400  units/hr, no bolus, first hep level low (0.15) Bolus 3000 units, rate increased to 1800 units/hr, level to 0.51,   08/18/19  Heparin level 0.41 units/ml on IV heparin at 1800 units/h  CBC: Hgb low and trending down, Pltc WNL but also trending down  Transition back to Eliquis today  Goal of Therapy:  Heparin level 0.3-0.7 Monitor platelets by anticoagulation protocol: Yes   Plan:   Discontinue IV heparin at 10am  Resume Eliquis 5mg  bid at 10am  Reinforce Eliquis education  Monitor s/s bleed  Follow CBC, will continue daily  Minda Ditto PharmD 08/18/2019,6:37 AM

## 2019-08-18 NOTE — Plan of Care (Signed)
All goals met for d/c 

## 2019-08-21 ENCOUNTER — Encounter (HOSPITAL_COMMUNITY): Payer: Self-pay | Admitting: Internal Medicine

## 2019-08-21 DIAGNOSIS — K209 Esophagitis, unspecified without bleeding: Secondary | ICD-10-CM | POA: Diagnosis present

## 2019-08-21 DIAGNOSIS — D500 Iron deficiency anemia secondary to blood loss (chronic): Secondary | ICD-10-CM

## 2019-08-21 DIAGNOSIS — D649 Anemia, unspecified: Secondary | ICD-10-CM | POA: Diagnosis present

## 2019-08-21 HISTORY — DX: Esophagitis, unspecified without bleeding: K20.90

## 2019-08-21 HISTORY — DX: Iron deficiency anemia secondary to blood loss (chronic): D50.0

## 2019-08-22 ENCOUNTER — Encounter (INDEPENDENT_AMBULATORY_CARE_PROVIDER_SITE_OTHER): Payer: Self-pay | Admitting: Primary Care

## 2019-08-22 ENCOUNTER — Other Ambulatory Visit: Payer: Self-pay

## 2019-08-22 ENCOUNTER — Ambulatory Visit (INDEPENDENT_AMBULATORY_CARE_PROVIDER_SITE_OTHER): Payer: Self-pay | Admitting: Primary Care

## 2019-08-22 VITALS — BP 119/66 | HR 78 | Temp 97.7°F | Ht 78.0 in | Wt 216.4 lb

## 2019-08-22 DIAGNOSIS — K209 Esophagitis, unspecified without bleeding: Secondary | ICD-10-CM

## 2019-08-22 DIAGNOSIS — E876 Hypokalemia: Secondary | ICD-10-CM

## 2019-08-22 DIAGNOSIS — E1169 Type 2 diabetes mellitus with other specified complication: Secondary | ICD-10-CM

## 2019-08-22 DIAGNOSIS — I10 Essential (primary) hypertension: Secondary | ICD-10-CM

## 2019-08-22 DIAGNOSIS — Z09 Encounter for follow-up examination after completed treatment for conditions other than malignant neoplasm: Secondary | ICD-10-CM

## 2019-08-22 DIAGNOSIS — E785 Hyperlipidemia, unspecified: Secondary | ICD-10-CM

## 2019-08-22 DIAGNOSIS — F4323 Adjustment disorder with mixed anxiety and depressed mood: Secondary | ICD-10-CM

## 2019-08-22 DIAGNOSIS — D500 Iron deficiency anemia secondary to blood loss (chronic): Secondary | ICD-10-CM

## 2019-08-22 MED ORDER — POTASSIUM CHLORIDE CRYS ER 10 MEQ PO TBCR: 10.0000 meq | EXTENDED_RELEASE_TABLET | Freq: Every day | ORAL | 3 refills | Status: DC

## 2019-08-22 MED ORDER — BUSPIRONE HCL 7.5 MG PO TABS: 7.5000 mg | ORAL_TABLET | Freq: Three times a day (TID) | ORAL | 1 refills | Status: DC

## 2019-08-22 MED ORDER — FUROSEMIDE 20 MG PO TABS: 20.0000 mg | ORAL_TABLET | Freq: Every day | ORAL | 3 refills | Status: DC

## 2019-08-22 NOTE — Progress Notes (Signed)
New Patient Office Visit  Subjective:  Patient ID: Richard Lopez, male    DOB: 24-Mar-1976  Age: 43 y.o. MRN: [id number]  CC:  Chief Complaint  Patient presents with  . Hospitalization Follow-up    PE/GI bleed    HPI [name] presents for hospital follow up with complication from alcohol abuse . Today he states he has not had a beer since discharge.   Past Medical History:  Diagnosis Date  . Anxiety   . 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 Depression   . Diabetes mellitus    diet controlled  . Essential hypertension   . Gastritis   . Gout   . Normocytic anemia due to blood loss 08/21/2019  . Pulmonary embolism [facility]     Past Surgical History:  Procedure Laterality Date  . ESOPHAGOGASTRODUODENOSCOPY (EGD) WITH PROPOFOL N/A 08/14/2019   Procedure: ESOPHAGOGASTRODUODENOSCOPY (EGD) WITH PROPOFOL;  Surgeon: Wilford Corner, MD;  Location: [facility];  Service: Endoscopy;  Laterality: N/A;  . EXTERNAL FIXATION LEG Left 11/07/2018   Procedure: EXTERNAL FIXATION LEFT LOWER LEG;  Surgeon: Rod Can, MD;  Location: [facility];  Service: Orthopedics;  Laterality: Left;  . EXTERNAL FIXATION REMOVAL Left 11/08/2018   Procedure: REMOVAL EXTERNAL FIXATION LEG;  Surgeon: Shona Needles, MD;  Location: [place];  Service: Orthopedics;  Laterality: Left;  . INTRAMEDULLARY (IM) NAIL INTERTROCHANTERIC Right 03/26/2019   Procedure: INTRAMEDULLARY (IM) NAIL INTERTROCHANTRIC;  Surgeon: Shona Needles, MD;  Location: [place];  Service: Orthopedics;  Laterality: Right;  . INTRAMEDULLARY (IM) NAIL INTERTROCHANTERIC Right 05/17/2019   Procedure: Intramedullary (Im) Nail Intertroch with circlage wiring;  Surgeon: Altamese Bismarck, MD;  Location: [place];  Service: Orthopedics;  Laterality: Right;  . NO PAST SURGERIES    . OPEN REDUCTION INTERNAL FIXATION (ORIF) TIBIA/FIBULA FRACTURE Left 11/08/2018   Procedure: OPEN REDUCTION INTERNAL FIXATION (ORIF) TIBIA/FIBULA FRACTURE;  Surgeon: Shona Needles,  MD;  Location: [place];  Service: Orthopedics;  Laterality: Left;  . ORIF FEMUR FRACTURE Right 05/17/2019   Procedure: REMOVAL  OF HARDWARE;  Surgeon: Altamese [place], MD;  Location: [place];  Service: Orthopedics;  Laterality: Right;    Family History  Problem Relation Age of Onset  . Diabetes Mellitus II Father   . Diabetes Mellitus II Other   . CAD Other     Social History   Socioeconomic History  . Marital status: Single    Spouse name: Not on file  . Number of children: Not on file  . Years of education: Not on file  . Highest education level: Some college, no degree  Occupational History  . Not on file  Social Needs  . Financial resource strain: Somewhat hard  . Food insecurity    Worry: Patient refused    Inability: Patient refused  . Transportation needs    Medical: Patient refused    Non-medical: Patient refused  Tobacco Use  . Smoking status: Former Smoker    Packs/day: 0.00    Types: Cigarettes  . Smokeless tobacco: Never Used  . Tobacco comment: About 1 cigarette per day or less  Substance and Sexual Activity  . Alcohol use: Not Currently    Alcohol/week: 200.0 standard drinks    Types: 200 Cans of beer per week    Comment: 2-3 40oz beers per week  . Drug use: Yes    Types: Marijuana    Comment: 3 times a week  . Sexual activity: Yes  Lifestyle  .  Physical activity    Days per week: Patient refused    Minutes per session: Patient refused  . Stress: Rather much  Relationships  . Social Herbalist on phone: Patient refused    Gets together: Patient refused    Attends religious service: Patient refused    Active member of club or organization: Patient refused    Attends meetings of clubs or organizations: Patient refused    Relationship status: Patient refused  . Intimate partner violence    Fear of current or ex partner: Patient refused    Emotionally abused: Patient refused    Physically abused: Patient refused    Forced sexual activity:  Patient refused  Other Topics Concern  . Not on file  Social History Narrative  . Not on file    ROS Review of Systems  Musculoskeletal: Positive for back pain.       Bilateral leg pain  Neurological: Positive for weakness.  Psychiatric/Behavioral: The patient is nervous/anxious.   All other systems reviewed and are negative.   Objective:   Today's Vitals: BP 119/66 (BP Location: Left Arm, Patient Position: Sitting, Cuff Size: Normal)   Pulse 78   Temp 97.7 F (36.5 C) (Temporal)   Ht '6\' 6"'$  (1.981 m)   Wt 216 lb 6.4 oz (98.2 kg)   SpO2 100%   BMI 25.01 kg/m   Physical Exam Vitals signs reviewed.  Constitutional:      Appearance: Normal appearance. He is normal weight.  HENT:     Head: Normocephalic.     Right Ear: Tympanic membrane normal.     Left Ear: Tympanic membrane normal.     Mouth/Throat:     Pharynx: No posterior oropharyngeal erythema.  Eyes:     Extraocular Movements: Extraocular movements intact.     Pupils: Pupils are equal, round, and reactive to light.  Neck:     Musculoskeletal: Normal range of motion and neck supple.  Cardiovascular:     Rate and Rhythm: Normal rate and regular rhythm.  Pulmonary:     Effort: Pulmonary effort is normal.     Breath sounds: Normal breath sounds.  Abdominal:     General: Abdomen is flat. Bowel sounds are normal.     Palpations: Abdomen is soft.  Musculoskeletal:     Right lower leg: Edema present.     Left lower leg: Edema present.  Skin:    General: Skin is warm and dry.  Neurological:     Mental Status: He is alert and oriented to person, place, and time.  Psychiatric:        Mood and Affect: Mood normal.     Assessment & Plan:  Goble was seen today for hospitalization follow-up.  Diagnoses and all orders for this visit:  Esophagitis determined by endoscopy  Normocytic anemia due to blood loss -     CBC with Differential  Hypokalemia -     CMP14+EGFR  Hypomagnesemia  Type 2 diabetes mellitus  with hyperlipidemia (HCC) -     HgB A1c -     Microalbumin, urine  Essential hypertension  Hospital discharge follow-up  Adjustment disorder with mixed anxiety and depressed mood  Other orders -     busPIRone (BUSPAR) 7.5 MG tablet; Take 1 tablet (7.5 mg total) by mouth 3 (three) times daily. -     furosemide (LASIX) 20 MG tablet; Take 1 tablet (20 mg total) by mouth daily. -     potassium chloride SA (KLOR-CON) 10  MEQ tablet; Take 1 tablet (10 mEq total) by mouth daily.    Outpatient Encounter Medications as of 08/22/2019  Medication Sig  . Eliquis DVT/PE Starter Pack (ELIQUIS STARTER PACK) 5 MG TABS Take as directed on package: start with two-62m tablets twice daily for 7 days. On day 8, switch to one-513mtablet twice daily.  . magnesium oxide (MAG-OX) 400 (241.3 Mg) MG tablet Take 1 tablet (400 mg total) by mouth daily.  . pantoprazole (PROTONIX) 40 MG tablet Take 1 tablet (40 mg total) by mouth 2 (two) times daily.  . busPIRone (BUSPAR) 7.5 MG tablet Take 1 tablet (7.5 mg total) by mouth 3 (three) times daily.  . Multiple Vitamin (MULTIVITAMIN WITH MINERALS) TABS tablet Take 1 tablet by mouth daily. (Patient not taking: Reported on 08/22/2019)  . [DISCONTINUED] cholecalciferol (VITAMIN D3) 25 MCG (1000 UT) tablet Take 2 tablets (2,000 Units total) by mouth 2 (two) times daily.  . [DISCONTINUED] guaiFENesin (MUCINEX) 600 MG 12 hr tablet Take 600 mg by mouth 2 (two) times daily as needed for cough or to loosen phlegm.  . [DISCONTINUED] thiamine 100 MG tablet Take 1 tablet (100 mg total) by mouth daily. (Patient not taking: Reported on 08/13/2019)   No facility-administered encounter medications on file as of 08/22/2019.     Follow-up: Return in about 6 weeks (around 10/03/2019) for Follow up with CSW and medication effectivness .   MiKerin PernaNP

## 2019-08-22 NOTE — Patient Instructions (Signed)

## 2019-08-23 LAB — CMP14+EGFR
ALT: 13 IU/L (ref 0–44)
AST: 23 IU/L (ref 0–40)
Albumin/Globulin Ratio: 0.8 — ABNORMAL LOW (ref 1.2–2.2)
Albumin: 2.8 g/dL — ABNORMAL LOW (ref 4.0–5.0)
Alkaline Phosphatase: 86 IU/L (ref 39–117)
BUN/Creatinine Ratio: 9 (ref 9–20)
BUN: 6 mg/dL (ref 6–24)
Bilirubin Total: <0.2 mg/dL (ref 0.0–1.2)
CO2: 25 mmol/L (ref 20–29)
Calcium: 8.2 mg/dL — ABNORMAL LOW (ref 8.7–10.2)
Chloride: 102 mmol/L (ref 96–106)
Creatinine, Ser: 0.68 mg/dL — ABNORMAL LOW (ref 0.76–1.27)
GFR calc Af Amer: 136 mL/min/{1.73_m2} (ref >59)
GFR calc non Af Amer: 118 mL/min/{1.73_m2} (ref >59)
Globulin, Total: 3.5 g/dL (ref 1.5–4.5)
Glucose: 95 mg/dL (ref 65–99)
Potassium: 3.8 mmol/L (ref 3.5–5.2)
Sodium: 141 mmol/L (ref 134–144)
Total Protein: 6.3 g/dL (ref 6.0–8.5)

## 2019-08-23 LAB — CBC WITH DIFFERENTIAL/PLATELET
Basophils Absolute: 0.1 10*3/uL (ref 0.0–0.2)
Basos: 1 %
EOS (ABSOLUTE): 0.1 10*3/uL (ref 0.0–0.4)
Eos: 2 %
Hematocrit: 30.7 % — ABNORMAL LOW (ref 37.5–51.0)
Hemoglobin: 10.2 g/dL — ABNORMAL LOW (ref 13.0–17.7)
Immature Grans (Abs): 0 10*3/uL (ref 0.0–0.1)
Immature Granulocytes: 0 %
Lymphocytes Absolute: 1.9 10*3/uL (ref 0.7–3.1)
Lymphs: 29 %
MCH: 26.8 pg (ref 26.6–33.0)
MCHC: 33.2 g/dL (ref 31.5–35.7)
MCV: 81 fL (ref 79–97)
Monocytes Absolute: 0.8 10*3/uL (ref 0.1–0.9)
Monocytes: 13 %
Neutrophils Absolute: 3.6 10*3/uL (ref 1.4–7.0)
Neutrophils: 55 %
Platelets: 383 10*3/uL (ref 150–450)
RBC: 3.81 x10E6/uL — ABNORMAL LOW (ref 4.14–5.80)
RDW: 15.9 % — ABNORMAL HIGH (ref 11.6–15.4)
WBC: 6.5 10*3/uL (ref 3.4–10.8)

## 2019-08-25 ENCOUNTER — Telehealth (INDEPENDENT_AMBULATORY_CARE_PROVIDER_SITE_OTHER): Payer: Self-pay

## 2019-08-25 NOTE — Telephone Encounter (Signed)
Patient verified date of birth. He is aware of results per PCP. FESO4 was not sent. Please send in Rx. Nat Christen, CMA

## 2019-08-25 NOTE — Telephone Encounter (Signed)
-----   Message from Kerin Perna, NP sent at 08/25/2019  4:09 PM EDT ----- Hemoglobin and hematocrit has slightly improved will send in FESO4 325mg  Iron rich foods such as shellfish,liver, organ meats(liver, gizzard), and red meats can increase cholesterol and should be consumed in moderation.However; legumes(beans), spinach, pumpkin seeds, [place], broccoli, tofu, green leafy vegetables and dark chocolate can be consumed without concern to cholesterol.

## 2019-09-05 ENCOUNTER — Telehealth (INDEPENDENT_AMBULATORY_CARE_PROVIDER_SITE_OTHER): Payer: Self-pay

## 2019-09-05 NOTE — Telephone Encounter (Signed)
Patient called to make a medication refill for  Eliquis DVT/PE Starter Pack (ELIQUIS STARTER PACK) 5 MG TABS   An he also wanted to know about FESO4 325mg  Iron. Patient wants to know where it was sent to.   Patient uses [facility] [place] [facility] [place]   Please advice [phone]

## 2019-09-05 NOTE — Telephone Encounter (Signed)
FWD to PCP. Tempestt S Roberts, CMA  

## 2019-09-07 NOTE — Telephone Encounter (Signed)
Good Morning Dr. Michail Sermon, I have a dilemma and requesting guidance from you. I am the above patients PCP he had a PE on 08/01/2019 and place on Eliquis . 08/14/2019 you saw him for a GI bleed.   My concern is continue the Eliquis for PE or stop for GI bleed?  Thank you  Juluis Mire , NP-C

## 2019-10-04 ENCOUNTER — Ambulatory Visit (INDEPENDENT_AMBULATORY_CARE_PROVIDER_SITE_OTHER): Payer: Self-pay | Admitting: Licensed Clinical Social Worker

## 2019-10-04 ENCOUNTER — Other Ambulatory Visit: Payer: Self-pay

## 2019-10-04 ENCOUNTER — Ambulatory Visit (INDEPENDENT_AMBULATORY_CARE_PROVIDER_SITE_OTHER): Payer: Self-pay | Admitting: Primary Care

## 2019-10-04 ENCOUNTER — Encounter (INDEPENDENT_AMBULATORY_CARE_PROVIDER_SITE_OTHER): Payer: Self-pay | Admitting: Primary Care

## 2019-10-04 DIAGNOSIS — Z789 Other specified health status: Secondary | ICD-10-CM

## 2019-10-04 DIAGNOSIS — F4323 Adjustment disorder with mixed anxiety and depressed mood: Secondary | ICD-10-CM

## 2019-10-04 DIAGNOSIS — Z7289 Other problems related to lifestyle: Secondary | ICD-10-CM

## 2019-10-04 MED ORDER — BUSPIRONE HCL 7.5 MG PO TABS: 7.5000 mg | ORAL_TABLET | Freq: Three times a day (TID) | ORAL | 1 refills | Status: DC

## 2019-10-04 NOTE — BH Specialist Note (Signed)
Integrated Behavioral Health Visit via Telemedicine (Telephone)  10/04/2019 Richard Lopez [id number]   Session Start time: 10:40 AM  Session End time: 10:53 AM Total time: 13  Referring Provider: NP Oletta Lamas Type of Visit: Telephonic Patient location: Home [facility] Provider location: Office All persons participating in visit: LCSW and patient  Confirmed patient's address: Yes  Confirmed patient's phone number: Yes  Any changes to demographics: No   Confirmed patient's insurance: Yes  Any changes to patient's insurance: No   Discussed confidentiality: Yes    The following statements were read to the patient and/or legal guardian that are established with the [facility] Provider.  "The purpose of this phone visit is to provide behavioral health care while limiting exposure to the coronavirus (COVID19).  There is a possibility of technology failure and discussed alternative modes of communication if that failure occurs."  "By engaging in this telephone visit, you consent to the provision of healthcare.  Additionally, you authorize for your insurance to be billed for the services provided during this telephone visit."   Patient and/or legal guardian consented to telephone visit: Yes   PRESENTING CONCERNS: Patient and/or family reports the following symptoms/concerns: Pt is interested in initiating substance use treatment. Reports drinking approximately 2 beers daily. Pt noted that alcohol use has decreased significantly from drinking a fifth of vodka, a 12 pack of beer, and smoking a half ounce of weed on a daily basis (approx 10 years ago) Duration of problem: "it's been awhile"; Severity of problem: severe  STRENGTHS (Protective Factors/Coping Skills): Pt receives strong support from family and friends Pt is interested in substance use treatment Pt has good insight  GOALS ADDRESSED: Patient will: 1.  Reduce symptoms of: anxiety and depression  2.  Increase knowledge and/or  ability of: coping skills  3.  Demonstrate ability to: Increase adequate support systems for patient/family and Decrease self-medicating behaviors  INTERVENTIONS: Interventions utilized:  Solution-Focused Strategies, Psychoeducation and/or Health Education and Link to [employer] Standardized Assessments completed: GAD-7 and PHQ 2&9  ASSESSMENT: Patient currently experiencing anxiety and depression symptoms triggered by ongoing substance use. Pt has decreased his alcohol use significantly on his own; however, is consuming approximately two beers daily. He receives support from family and friends.    Patient may benefit from substance use treatment. He is participating in medication management through PCP to assist in decreasing reported symptoms. LCSW provided support and validation. Pt was informed of correlation between one's physical and medical health, in addition, to how substance use can negatively impact both.   Pt is interested in initiating substance use treatment and applying for disability due to multiple injuries this year that has prevented him from returning to work. LCSW provided information on Alcohol Drug Services and Legal Aid, in addition, to crisis intervention resources via email, per patient request. LCSW strongly encouraged pt to apply for Financial Counseling to assist with medical coverage. An application was mailed to pt's address on file.   PLAN: 1. Follow up with behavioral health clinician on : Contact LCSW with any additional behavioral health and/or resource needs 2. Behavioral recommendations: Continue to comply with medication management, initiate substance use treatment, and schedule appointment with Financial Counseling to assist with medical coverage. Pt was encouraged to contact LCSW to complete Legal Aid referral if he is denied SSI benefits 3. Referral(s): [facility]) and Substance Abuse Program  Rebekah [place],  [place] 10/04/2019 11:04 AM

## 2019-10-04 NOTE — Progress Notes (Signed)
Virtual Visit via Telephone Note  I connected with Richard Lopez on 10/04/19 at 10:10 AM EST by telephone and verified that I am speaking with the correct person using two identifiers.   I discussed the limitations, risks, security and privacy concerns of performing an evaluation and management service by telephone and the availability of in person appointments. I also discussed with the patient that there may be a patient responsible charge related to this service. The patient expressed understanding and agreed to proceed.   History of Present Illness: [name] is having a tele visit for effectiveness of addition of Buspar. He admits to being able to tell a difference but would like to see a therapist for depression and someone for substance abuse defer to CSW   Past Medical History:  Diagnosis Date  . Anxiety   . 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 Depression   . Diabetes mellitus    diet controlled  . Essential hypertension   . Gastritis   . Gout   . Normocytic anemia due to blood loss 08/21/2019  . Pulmonary embolism (HCC)    Observations/Objective: Review of Systems  Psychiatric/Behavioral: Positive for depression and substance abuse. The patient is nervous/anxious.     Assessment and Plan: Richard Lopez was seen today for follow-up and medication effectiveness.  Diagnoses and all orders for this visit:  Adjustment disorder with mixed anxiety and depressed mood Per patient there is a improvement in anxiety and depression with taking Buspar refills sent. -     busPIRone (BUSPAR) 7.5 MG tablet; Take 1 tablet (7.5 mg total) by mouth 3 (three) times daily.    Follow Up Instructions:    I discussed the assessment and treatment plan with the patient. The patient was provided an opportunity to ask questions and all were answered. The patient agreed with the plan and demonstrated an understanding of the instructions.   The patient was advised to call back or seek an  in-person evaluation if the symptoms worsen or if the condition fails to improve as anticipated.  I provided 12 minutes of non-face-to-face time during this encounter.   Kerin Perna, NP

## 2020-01-12 ENCOUNTER — Emergency Department (HOSPITAL_COMMUNITY): Payer: Self-pay

## 2020-01-12 ENCOUNTER — Emergency Department (HOSPITAL_COMMUNITY)
Admission: EM | Admit: 2020-01-12 | Discharge: 2020-01-12 | Disposition: A | Discharge status: Home / Self Care | Payer: Self-pay | Attending: Emergency Medicine | Admitting: Emergency Medicine

## 2020-01-12 ENCOUNTER — Other Ambulatory Visit: Payer: Self-pay

## 2020-01-12 ENCOUNTER — Encounter (HOSPITAL_COMMUNITY): Payer: Self-pay

## 2020-01-12 DIAGNOSIS — I10 Essential (primary) hypertension: Secondary | ICD-10-CM | POA: Insufficient documentation

## 2020-01-12 DIAGNOSIS — E119 Type 2 diabetes mellitus without complications: Secondary | ICD-10-CM | POA: Insufficient documentation

## 2020-01-12 DIAGNOSIS — Z79899 Other long term (current) drug therapy: Secondary | ICD-10-CM | POA: Insufficient documentation

## 2020-01-12 DIAGNOSIS — Z87891 Personal history of nicotine dependence: Secondary | ICD-10-CM | POA: Insufficient documentation

## 2020-01-12 DIAGNOSIS — F1910 Other psychoactive substance abuse, uncomplicated: Secondary | ICD-10-CM | POA: Insufficient documentation

## 2020-01-12 DIAGNOSIS — R112 Nausea with vomiting, unspecified: Secondary | ICD-10-CM | POA: Insufficient documentation

## 2020-01-12 LAB — CBC
HCT: 31.3 % — ABNORMAL LOW (ref 39.0–52.0)
Hemoglobin: 10.8 g/dL — ABNORMAL LOW (ref 13.0–17.0)
MCH: 30.4 pg (ref 26.0–34.0)
MCHC: 34.5 g/dL (ref 30.0–36.0)
MCV: 88.2 fL (ref 80.0–100.0)
Platelets: 119 10*3/uL — ABNORMAL LOW (ref 150–400)
RBC: 3.55 MIL/uL — ABNORMAL LOW (ref 4.22–5.81)
RDW: 16.6 % — ABNORMAL HIGH (ref 11.5–15.5)
WBC: 6.5 10*3/uL (ref 4.0–10.5)
nRBC: 0 % (ref 0.0–0.2)

## 2020-01-12 LAB — D-DIMER, QUANTITATIVE: D-Dimer, Quant: 2.53 ug/mL-FEU — ABNORMAL HIGH (ref 0.00–0.50)

## 2020-01-12 LAB — TROPONIN I (HIGH SENSITIVITY)
Troponin I (High Sensitivity): 9 ng/L (ref <18)
Troponin I (High Sensitivity): 9 ng/L (ref <18)

## 2020-01-12 LAB — RAPID URINE DRUG SCREEN, HOSP PERFORMED
Amphetamines: NOT DETECTED
Barbiturates: NOT DETECTED
Benzodiazepines: NOT DETECTED
Cocaine: POSITIVE — AB
Opiates: NOT DETECTED
Tetrahydrocannabinol: POSITIVE — AB

## 2020-01-12 LAB — BASIC METABOLIC PANEL
Anion gap: 11 (ref 5–15)
BUN: 7 mg/dL (ref 6–20)
CO2: 27 mmol/L (ref 22–32)
Calcium: 8 mg/dL — ABNORMAL LOW (ref 8.9–10.3)
Chloride: 99 mmol/L (ref 98–111)
Creatinine, Ser: 0.68 mg/dL (ref 0.61–1.24)
GFR calc Af Amer: >60 mL/min (ref >60)
GFR calc non Af Amer: >60 mL/min (ref >60)
Glucose, Bld: 88 mg/dL (ref 70–99)
Potassium: 3.7 mmol/L (ref 3.5–5.1)
Sodium: 137 mmol/L (ref 135–145)

## 2020-01-12 LAB — URINALYSIS, ROUTINE W REFLEX MICROSCOPIC
Bilirubin Urine: NEGATIVE
Glucose, UA: NEGATIVE mg/dL
Hgb urine dipstick: NEGATIVE
Ketones, ur: 5 mg/dL — AB
Leukocytes,Ua: NEGATIVE
Nitrite: NEGATIVE
Protein, ur: NEGATIVE mg/dL
Specific Gravity, Urine: 1.018 (ref 1.005–1.030)
pH: 5 (ref 5.0–8.0)

## 2020-01-12 LAB — ETHANOL: Alcohol, Ethyl (B): <10 mg/dL (ref <10)

## 2020-01-12 MED ORDER — SODIUM CHLORIDE 0.9% FLUSH
3.0000 mL | Freq: Once | INTRAVENOUS | Status: AC
  Administered 2020-01-12: 3 mL via INTRAVENOUS

## 2020-01-12 MED ORDER — IOHEXOL 350 MG/ML SOLN
100.0000 mL | Freq: Once | INTRAVENOUS | Status: AC | PRN
  Administered 2020-01-12: 100 mL via INTRAVENOUS

## 2020-01-12 MED ORDER — HYDROCOD POLST-CPM POLST ER 10-8 MG/5ML PO SUER
5.0000 mL | Freq: Once | ORAL | Status: AC
  Administered 2020-01-12: 5 mL via ORAL
  Filled 2020-01-12: qty 5

## 2020-01-12 MED ORDER — MORPHINE SULFATE (PF) 4 MG/ML IV SOLN
4.0000 mg | Freq: Once | INTRAVENOUS | Status: AC
  Administered 2020-01-12: 4 mg via INTRAVENOUS
  Filled 2020-01-12: qty 1

## 2020-01-12 MED ORDER — SODIUM CHLORIDE 0.9 % IV BOLUS
1000.0000 mL | Freq: Once | INTRAVENOUS | Status: AC
  Administered 2020-01-12: 1000 mL via INTRAVENOUS

## 2020-01-12 MED ORDER — ONDANSETRON HCL 4 MG/2ML IJ SOLN
4.0000 mg | Freq: Once | INTRAMUSCULAR | Status: AC
  Administered 2020-01-12: 4 mg via INTRAVENOUS
  Filled 2020-01-12: qty 2

## 2020-01-12 NOTE — Discharge Instructions (Addendum)
You were evaluated in the Emergency Department and after careful evaluation, we did not find any emergent condition requiring admission or further testing in the hospital. ° °Your exam/testing today was overall reassuring. ° °Please return to the Emergency Department if you experience any worsening of your condition.  We encourage you to follow up with a primary care provider.  Thank you for allowing us to be a part of your care. ° °

## 2020-01-12 NOTE — ED Provider Notes (Signed)
  Provider Note MRN:  [id number]  Arrival date & time: 01/12/20    ED Course and Medical Decision Making  Assumed care from Dr. Particia Nearing at shift change.  Awaiting CTA chest to r/o PE, if without acute process appropriate for dc.  5:10 PM update: CTA is without evidence of pulmonary embolism, patient is appropriate for discharge.  Procedures  Final Clinical Impressions(s) / ED Diagnoses     ICD-10-CM   1. Polysubstance abuse (HCC)  F19.10     ED Discharge Orders    None        Discharge Instructions     You were evaluated in the Emergency Department and after careful evaluation, we did not find any emergent condition requiring admission or further testing in the hospital.  Your exam/testing today was overall reassuring.  Please return to the Emergency Department if you experience any worsening of your condition.  We encourage you to follow up with a primary care provider.  Thank you for allowing [place] to be a part of your care.      Elmer Sow. Pilar Plate, MD [facility] Emergency Medicine [facility] [email or web address]    Sabas Sous, MD 01/12/20 [phone]

## 2020-01-12 NOTE — ED Provider Notes (Signed)
[facility]-EMERGENCY DEPT Provider Note   CSN: [id number] Arrival date & time: 01/12/20  0816     History Chief Complaint  Patient presents with  . Leg Pain  . Chest Pain    Richard Lopez is a 44 y.o. male.  Pt presents to the ED today with cough and n/v.  The pt has a hx of DVT and PE, but is not currently on his Eliquis.  He called his doctor to get it refilled, but they did not because of a GI bleed in October.  However at that GI bleed d/c, they told him to continue his Eliquis.  The pt said the PEs occurred after he broke both of his legs and was immobile.  Pt said he's not been able to tolerate fluids.  No known Covid exposures.        Past Medical History:  Diagnosis Date  . Anxiety   . 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 Depression   . Diabetes mellitus    diet controlled  . Essential hypertension   . Gastritis   . Gout   . Normocytic anemia due to blood loss 08/21/2019  . Pulmonary embolism [facility])     Patient Active Problem List   Diagnosis Date Noted  . Esophagitis determined by endoscopy 08/21/2019  . Normocytic anemia due to blood loss 08/21/2019  . Hypokalemia 08/17/2019  . Hypomagnesemia 08/17/2019  . Hemoptysis 08/17/2019  . Acute upper GI bleed 08/14/2019  . Hematemesis 08/13/2019  . Cavitary lesion of lung 08/01/2019  . Gout 05/18/2019  . Peri-prosthetic fracture of femur at tip of prosthesis 05/16/2019  . Alcohol use 05/16/2019  . Intertrochanteric fracture of right hip (HCC) 04/14/2019  . Vitamin D deficiency 04/14/2019  . DTs (delirium tremens) (HCC) 04/07/2019  . Delirium tremens (HCC) 04/07/2019  . Encephalopathy acute   . Closed right hip fracture, initial encounter (HCC) 03/25/2019  . Alcohol dependence with intoxication (HCC) 03/25/2019  . Hypertensive urgency 03/25/2019  . Thrombocytopenia (HCC) 03/25/2019  . Effusion, right knee 03/25/2019  . Closed fracture of right femur (HCC)   . Metabolic acidosis     . Prediabetes 04/02/2017  . Gastritis 04/02/2017  . Marijuana abuse 04/01/2017  . Nausea with vomiting 07/29/2014  . Type 2 diabetes mellitus with hyperlipidemia (HCC) 07/29/2014  . Essential hypertension 07/29/2014  . Nausea & vomiting 07/29/2014    Past Surgical History:  Procedure Laterality Date  . ESOPHAGOGASTRODUODENOSCOPY (EGD) WITH PROPOFOL N/A 08/14/2019   Procedure: ESOPHAGOGASTRODUODENOSCOPY (EGD) WITH PROPOFOL;  Surgeon: Charlott Rakes, MD;  Location: [facility];  Service: Endoscopy;  Laterality: N/A;  . EXTERNAL FIXATION LEG Left 11/07/2018   Procedure: EXTERNAL FIXATION LEFT LOWER LEG;  Surgeon: Samson Frederic, MD;  Location: [facility];  Service: Orthopedics;  Laterality: Left;  . EXTERNAL FIXATION REMOVAL Left 11/08/2018   Procedure: REMOVAL EXTERNAL FIXATION LEG;  Surgeon: Roby Lofts, MD;  Location: [facility] OR;  Service: Orthopedics;  Laterality: Left;  . INTRAMEDULLARY (IM) NAIL INTERTROCHANTERIC Right 03/26/2019   Procedure: INTRAMEDULLARY (IM) NAIL INTERTROCHANTRIC;  Surgeon: Roby Lofts, MD;  Location: [facility] OR;  Service: Orthopedics;  Laterality: Right;  . INTRAMEDULLARY (IM) NAIL INTERTROCHANTERIC Right 05/17/2019   Procedure: Intramedullary (Im) Nail Intertroch with circlage wiring;  Surgeon: Myrene Galas, MD;  Location: [facility] OR;  Service: Orthopedics;  Laterality: Right;  . NO PAST SURGERIES    . OPEN REDUCTION INTERNAL FIXATION (ORIF) TIBIA/FIBULA FRACTURE Left 11/08/2018   Procedure: OPEN REDUCTION INTERNAL FIXATION (  ORIF) TIBIA/FIBULA FRACTURE;  Surgeon: Shona Needles, MD;  Location: [place];  Service: Orthopedics;  Laterality: Left;  . ORIF FEMUR FRACTURE Right 05/17/2019   Procedure: REMOVAL  OF HARDWARE;  Surgeon: Altamese Marianna, MD;  Location: [place];  Service: Orthopedics;  Laterality: Right;       Family History  Problem Relation Age of Onset  . Diabetes Mellitus II Father   . Diabetes Mellitus II Other   . CAD Other     Social History   Tobacco Use   . Smoking status: Former Smoker    Packs/day: 0.00    Types: Cigarettes  . Smokeless tobacco: Never Used  . Tobacco comment: About 1 cigarette per day or less  Substance Use Topics  . Alcohol use: Yes    Alcohol/week: 200.0 standard drinks    Types: 200 Cans of beer per week  . Drug use: Yes    Types: Marijuana    Home Medications Prior to Admission medications   Medication Sig Start Date End Date Taking? Authorizing Provider  omeprazole (PRILOSEC) 20 MG capsule Take 20 mg by mouth 2 (two) times daily as needed (indigestion).   Yes [provider]  busPIRone (BUSPAR) 7.5 MG tablet Take 1 tablet (7.5 mg total) by mouth 3 (three) times daily. Patient not taking: Reported on 01/12/2020 10/04/19   Kerin Perna, NP  Eliquis DVT/PE Starter Pack (ELIQUIS STARTER PACK) 5 MG TABS Take as directed on package: start with two-5mg  tablets twice daily for 7 days. On day 8, switch to one-5mg  tablet twice daily. Patient not taking: Reported on 10/04/2019 08/04/19   Barb Merino, MD  furosemide (LASIX) 20 MG tablet Take 1 tablet (20 mg total) by mouth daily. Patient not taking: Reported on 10/04/2019 08/22/19   Kerin Perna, NP  magnesium oxide (MAG-OX) 400 (241.3 Mg) MG tablet Take 1 tablet (400 mg total) by mouth daily. Patient not taking: Reported on 01/12/2020 08/19/19   Desiree Hane, MD  Multiple Vitamin (MULTIVITAMIN WITH MINERALS) TABS tablet Take 1 tablet by mouth daily. Patient not taking: Reported on 01/12/2020 05/20/19   Aline August, MD  pantoprazole (PROTONIX) 40 MG tablet Take 1 tablet (40 mg total) by mouth 2 (two) times daily. Patient not taking: Reported on 01/12/2020 08/18/19   Oretha Milch D, MD  potassium chloride SA (KLOR-CON) 10 MEQ tablet Take 1 tablet (10 mEq total) by mouth daily. Patient not taking: Reported on 01/12/2020 08/22/19   Kerin Perna, NP    Allergies    Patient has no known allergies.  Review of Systems   Review of Systems   Respiratory: Positive for cough.   Gastrointestinal: Positive for nausea and vomiting.  All other systems reviewed and are negative.   Physical Exam Updated Vital Signs BP 133/67   Pulse 89   Temp 98.3 F (36.8 C) (Oral)   Resp 18   Ht 6\' 6"  (1.981 m)   Wt 99.8 kg   SpO2 100%   BMI 25.42 kg/m   Physical Exam Vitals and nursing note reviewed.  Constitutional:      Appearance: He is well-developed.  HENT:     Head: Normocephalic and atraumatic.     Mouth/Throat:     Mouth: Mucous membranes are dry.  Eyes:     Extraocular Movements: Extraocular movements intact.     Pupils: Pupils are equal, round, and reactive to light.  Cardiovascular:     Rate and Rhythm: Regular rhythm. Tachycardia present.  Heart sounds: Normal heart sounds.  Pulmonary:     Effort: Pulmonary effort is normal.     Breath sounds: Normal breath sounds.  Abdominal:     General: Bowel sounds are normal.     Palpations: Abdomen is soft.  Musculoskeletal:        General: Normal range of motion.     Cervical back: Normal range of motion and neck supple.  Skin:    General: Skin is warm.     Capillary Refill: Capillary refill takes less than 2 seconds.  Neurological:     General: No focal deficit present.     Mental Status: He is alert and oriented to person, place, and time.  Psychiatric:        Mood and Affect: Mood normal.        Behavior: Behavior normal.     ED Results / Procedures / Treatments   Labs (all labs ordered are listed, but only abnormal results are displayed) Labs Reviewed  BASIC METABOLIC PANEL - Abnormal; Notable for the following components:      Result Value   Calcium 8.0 (*)    All other components within normal limits  CBC - Abnormal; Notable for the following components:   RBC 3.55 (*)    Hemoglobin 10.8 (*)    HCT 31.3 (*)    RDW 16.6 (*)    Platelets 119 (*)    All other components within normal limits  D-DIMER, QUANTITATIVE (NOT AT [facility]) - Abnormal; Notable  for the following components:   D-Dimer, Quant 2.53 (*)    All other components within normal limits  RAPID URINE DRUG SCREEN, HOSP PERFORMED - Abnormal; Notable for the following components:   Cocaine POSITIVE (*)    Tetrahydrocannabinol POSITIVE (*)    All other components within normal limits  URINALYSIS, ROUTINE W REFLEX MICROSCOPIC - Abnormal; Notable for the following components:   Ketones, ur 5 (*)    All other components within normal limits  ETHANOL  TROPONIN I (HIGH SENSITIVITY)  TROPONIN I (HIGH SENSITIVITY)    EKG EKG Interpretation  Date/Time:  Thursday January 12 2020 08:28:56 EST Ventricular Rate:  107 PR Interval:    QRS Duration: 80 QT Interval:  337 QTC Calculation: 450 R Axis:   59 Text Interpretation: Sinus tachycardia Multiform ventricular premature complexes Low voltage, precordial leads Borderline repolarization abnormality Baseline wander in lead(s) V6 12 Lead; [place] Confirmed by Jacalyn Lefevre [phone]) on 01/12/2020 8:56:17 AM   Radiology DG Chest 2 View  Result Date: 01/12/2020 CLINICAL DATA:  Chest pain for 2 days. EXAM: CHEST - 2 VIEW COMPARISON:  August 13, 2019 FINDINGS: The heart size and mediastinal contours are within normal limits. Both lungs are clear. The visualized skeletal structures are unremarkable. IMPRESSION: No active cardiopulmonary disease. Electronically Signed   By: Sherian Rein M.D.   On: 01/12/2020 08:58    Procedures Procedures (including critical care time)  Medications Ordered in ED Medications  sodium chloride flush (NS) 0.9 % injection 3 mL (3 mLs Intravenous Given 01/12/20 0941)  sodium chloride 0.9 % bolus 1,000 mL (0 mLs Intravenous Stopped 01/12/20 1225)  ondansetron (ZOFRAN) injection 4 mg (4 mg Intravenous Given 01/12/20 0937)  chlorpheniramine-HYDROcodone (TUSSIONEX) 10-8 MG/5ML suspension 5 mL (5 mLs Oral Given 01/12/20 1214)  morphine 4 MG/ML injection 4 mg (4 mg Intravenous Given 01/12/20 1319)  iohexol  (OMNIPAQUE) 350 MG/ML injection 100 mL (100 mLs Intravenous Contrast Given 01/12/20 1511)    ED Course  I have  reviewed the triage vital signs and the nursing notes.  Pertinent labs & imaging results that were available during my care of the patient were reviewed by me and considered in my medical decision making (see chart for details).    MDM Rules/Calculators/A&P                      Pt is feeling better after treatment.  CXR is clear and DDimer is elevated with pt not on his Eliquis.  Pt is awaiting CT chest to r/o PE.  Final Clinical Impression(s) / ED Diagnoses Final diagnoses:  Polysubstance abuse [facility])    Rx / DC Orders ED Discharge Orders    None       Jacalyn Lefevre, MD 01/12/20 1531

## 2020-01-12 NOTE — ED Triage Notes (Signed)
Patient c/o bilateral leg pain and right chest pain x 2 days. Patient states he has a history of blood clots and pneumonia. Patient states his right chest hurts worse with a deep breath and when he coughs. Patient has a productive cough with yellow sputum x 2 days as well.

## 2020-04-10 ENCOUNTER — Emergency Department (HOSPITAL_COMMUNITY)
Admission: EM | Admit: 2020-04-10 | Discharge: 2020-04-11 | Disposition: A | Discharge status: Home / Self Care | Payer: Self-pay | Attending: Emergency Medicine | Admitting: Emergency Medicine

## 2020-04-10 ENCOUNTER — Emergency Department (HOSPITAL_COMMUNITY): Payer: Self-pay

## 2020-04-10 ENCOUNTER — Other Ambulatory Visit: Payer: Self-pay

## 2020-04-10 ENCOUNTER — Encounter (HOSPITAL_COMMUNITY): Payer: Self-pay

## 2020-04-10 DIAGNOSIS — Z79899 Other long term (current) drug therapy: Secondary | ICD-10-CM | POA: Insufficient documentation

## 2020-04-10 DIAGNOSIS — R270 Ataxia, unspecified: Secondary | ICD-10-CM | POA: Insufficient documentation

## 2020-04-10 DIAGNOSIS — W01198A Fall on same level from slipping, tripping and stumbling with subsequent striking against other object, initial encounter: Secondary | ICD-10-CM | POA: Insufficient documentation

## 2020-04-10 DIAGNOSIS — E860 Dehydration: Secondary | ICD-10-CM | POA: Insufficient documentation

## 2020-04-10 DIAGNOSIS — Z86711 Personal history of pulmonary embolism: Secondary | ICD-10-CM | POA: Insufficient documentation

## 2020-04-10 DIAGNOSIS — Z7901 Long term (current) use of anticoagulants: Secondary | ICD-10-CM | POA: Insufficient documentation

## 2020-04-10 DIAGNOSIS — Z87891 Personal history of nicotine dependence: Secondary | ICD-10-CM | POA: Insufficient documentation

## 2020-04-10 DIAGNOSIS — Z8781 Personal history of (healed) traumatic fracture: Secondary | ICD-10-CM | POA: Insufficient documentation

## 2020-04-10 DIAGNOSIS — I10 Essential (primary) hypertension: Secondary | ICD-10-CM | POA: Insufficient documentation

## 2020-04-10 DIAGNOSIS — R55 Syncope and collapse: Secondary | ICD-10-CM | POA: Insufficient documentation

## 2020-04-10 DIAGNOSIS — Y92010 Kitchen of single-family (private) house as the place of occurrence of the external cause: Secondary | ICD-10-CM | POA: Insufficient documentation

## 2020-04-10 DIAGNOSIS — S0990XA Unspecified injury of head, initial encounter: Secondary | ICD-10-CM

## 2020-04-10 DIAGNOSIS — E1159 Type 2 diabetes mellitus with other circulatory complications: Secondary | ICD-10-CM | POA: Insufficient documentation

## 2020-04-10 DIAGNOSIS — Y999 Unspecified external cause status: Secondary | ICD-10-CM | POA: Insufficient documentation

## 2020-04-10 DIAGNOSIS — Y9389 Activity, other specified: Secondary | ICD-10-CM | POA: Insufficient documentation

## 2020-04-10 LAB — CBG MONITORING, ED: Glucose-Capillary: 164 mg/dL — ABNORMAL HIGH (ref 70–99)

## 2020-04-10 MED ORDER — ONDANSETRON 8 MG PO TBDP
8.0000 mg | ORAL_TABLET | Freq: Once | ORAL | Status: AC
  Administered 2020-04-10: 8 mg via ORAL
  Filled 2020-04-10: qty 1

## 2020-04-10 MED ORDER — SODIUM CHLORIDE 0.9 % IV BOLUS
1000.0000 mL | Freq: Once | INTRAVENOUS | Status: AC
  Administered 2020-04-11: 1000 mL via INTRAVENOUS
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
## 2020-04-10 NOTE — ED Notes (Signed)
Attempted IV insertion x 2 without success. IV team consult placed

## 2020-04-11 ENCOUNTER — Emergency Department (HOSPITAL_COMMUNITY): Payer: Self-pay

## 2020-04-11 LAB — URINALYSIS, ROUTINE W REFLEX MICROSCOPIC
Bacteria, UA: NONE SEEN
Bilirubin Urine: NEGATIVE
Glucose, UA: NEGATIVE mg/dL
Hgb urine dipstick: NEGATIVE
Ketones, ur: NEGATIVE mg/dL
Nitrite: NEGATIVE
Protein, ur: 30 mg/dL — AB
Specific Gravity, Urine: 1.016 (ref 1.005–1.030)
pH: 6 (ref 5.0–8.0)

## 2020-04-11 LAB — CBC WITH DIFFERENTIAL/PLATELET
Abs Immature Granulocytes: 0.04 10*3/uL (ref 0.00–0.07)
Basophils Absolute: 0.1 10*3/uL (ref 0.0–0.1)
Basophils Relative: 1 %
Eosinophils Absolute: 0 10*3/uL (ref 0.0–0.5)
Eosinophils Relative: 0 %
HCT: 36 % — ABNORMAL LOW (ref 39.0–52.0)
Hemoglobin: 12.6 g/dL — ABNORMAL LOW (ref 13.0–17.0)
Immature Granulocytes: 1 %
Lymphocytes Relative: 25 %
Lymphs Abs: 1.2 10*3/uL (ref 0.7–4.0)
MCH: 31.9 pg (ref 26.0–34.0)
MCHC: 35 g/dL (ref 30.0–36.0)
MCV: 91.1 fL (ref 80.0–100.0)
Monocytes Absolute: 0.6 10*3/uL (ref 0.1–1.0)
Monocytes Relative: 13 %
Neutro Abs: 2.9 10*3/uL (ref 1.7–7.7)
Neutrophils Relative %: 60 %
Platelets: 79 10*3/uL — ABNORMAL LOW (ref 150–400)
RBC: 3.95 MIL/uL — ABNORMAL LOW (ref 4.22–5.81)
RDW: 14.6 % (ref 11.5–15.5)
WBC: 4.8 10*3/uL (ref 4.0–10.5)
nRBC: 0 % (ref 0.0–0.2)

## 2020-04-11 LAB — COMPREHENSIVE METABOLIC PANEL
ALT: 57 U/L — ABNORMAL HIGH (ref 0–44)
AST: 111 U/L — ABNORMAL HIGH (ref 15–41)
Albumin: 3.6 g/dL (ref 3.5–5.0)
Alkaline Phosphatase: 125 U/L (ref 38–126)
Anion gap: 13 (ref 5–15)
BUN: 7 mg/dL (ref 6–20)
CO2: 26 mmol/L (ref 22–32)
Calcium: 8.1 mg/dL — ABNORMAL LOW (ref 8.9–10.3)
Chloride: 92 mmol/L — ABNORMAL LOW (ref 98–111)
Creatinine, Ser: 0.94 mg/dL (ref 0.61–1.24)
GFR calc Af Amer: >60 mL/min (ref >60)
GFR calc non Af Amer: >60 mL/min (ref >60)
Glucose, Bld: 138 mg/dL — ABNORMAL HIGH (ref 70–99)
Potassium: 3.9 mmol/L (ref 3.5–5.1)
Sodium: 131 mmol/L — ABNORMAL LOW (ref 135–145)
Total Bilirubin: 1.7 mg/dL — ABNORMAL HIGH (ref 0.3–1.2)
Total Protein: 7.6 g/dL (ref 6.5–8.1)

## 2020-04-11 LAB — RAPID URINE DRUG SCREEN, HOSP PERFORMED
Amphetamines: NOT DETECTED
Barbiturates: NOT DETECTED
Benzodiazepines: NOT DETECTED
Cocaine: NOT DETECTED
Opiates: NOT DETECTED
Tetrahydrocannabinol: POSITIVE — AB

## 2020-04-11 LAB — ETHANOL: Alcohol, Ethyl (B): <10 mg/dL (ref <10)

## 2020-04-11 MED ORDER — OXYCODONE-ACETAMINOPHEN 5-325 MG PO TABS
1.0000 | ORAL_TABLET | Freq: Once | ORAL | Status: AC
  Administered 2020-04-11: 1 via ORAL
  Filled 2020-04-11: qty 1

## 2020-04-11 MED ORDER — LORAZEPAM 2 MG/ML IJ SOLN
1.0000 mg | Freq: Once | INTRAMUSCULAR | Status: AC
  Administered 2020-04-11: 1 mg via INTRAVENOUS
  Filled 2020-04-11: qty 1

## 2020-04-11 MED ORDER — SODIUM CHLORIDE 0.9 % IV BOLUS
2000.0000 mL | Freq: Once | INTRAVENOUS | Status: AC
  Administered 2020-04-11: 2000 mL via INTRAVENOUS

## 2020-04-11 NOTE — ED Notes (Signed)
Tape came off patient IV, and it came out.  Richard Lopez, [name] made aware and awaiting answer from PA on if patient needs another IV.

## 2020-04-11 NOTE — ED Notes (Signed)
Patient given food per [place], [name].

## 2020-04-11 NOTE — ED Notes (Signed)
Patient back from MRI.

## 2020-04-11 NOTE — ED Notes (Signed)
Attempted to ambulate pt with 2 assist. Pt stated that he was woozy when he sat up, and his dizziness got worse when he tried to stand. We put him back in bed so that he would not fall

## 2020-04-11 NOTE — ED Notes (Signed)
Called MRI for timeline; they say that they will send the RN a message so that they can give the ativan right beforehand

## 2020-04-11 NOTE — Evaluation (Signed)
Physical Therapy Evaluation Patient Details Name: Richard Lopez MRN: [id number] DOB: 02/13/1976 Today's Date: 04/11/2020   History of Present Illness  Patient is a 44 year old male with PMH significant for DM, HTN, gout, OA, ORIF for Rt hip fracture with IM nail (03/26/19 and 05/17/19), Lt tib/fib frcture s/p ORIF on 11/08/18. Patient presented to ED late evening on 04/10/20 after a fall at home. Pt reports he got dizzy when he got up from sofa to get a soda out of the refrigerator and then fell backward hitting the back of his head against the refrigerator. Patient reports LOC.  Following that, he noted that things seem to be blurry and he was unusually sleepy. On exam, there is a small hematoma on the occiput, but he is neurologically intact.  CT of head showed no intracranial injury. Radiographs of Rt LE (ankle, knee, hip) show no acute injury. MRI of brain on 04/11/20 show no acute traumatic findings.    Clinical Impression  Richard Lopez is 44 y.o. male admitted with above HPI and s/p fall at home with LOC and hitting his head. Patient is currently limited by functional impairments below (see PT problem list). Patient lives alone and is uncertain on friends/family available to stay with him and assist him at this time. He reports since multiple surgeries ~ 1 year ago he has been independent with intermittent use of SPC/crutch for household and limited community mobility PTA. Patient was abel to complete bed mobility at [place] level with extra time. He completed 2x sit<>stand with HHA and RW. Pt very unsteady with 1HHA and requried min assist to prevent LOB. Standing balance improved with RW use and min guard provided for safety. Patient attempted several small steps forward/backward at EOB however c/o dizziness. BP assessed and 144/128 with HR reaching 140 max and further gait was deferred. HR returned to resting rate of ~90-100's and BP after supine rest 136/84. Patient will benefit from continued  skilled PT interventions to address impairments and progress independence with mobility, recommending HHPT with supervision for all mobility and intermittent supervision for daily needs. If patient does not have any family/friends available he may benefit from short stay at SNF to progress independence and tolerance to functional mobility. Acute PT will follow and progress as able.     Follow Up Recommendations Home health PT;Supervision for mobility/OOB;Supervision - Intermittent    Equipment Recommendations  Rolling walker with 5" wheels(may need tall RW (p tis 6'6"))    Recommendations for Other Services       Precautions / Restrictions Precautions Precautions: Fall Restrictions Weight Bearing Restrictions: No      Mobility  Bed Mobility Overal bed mobility: Needs Assistance Bed Mobility: Supine to Sit;Sit to Supine     Supine to sit: Modified independent (Device/Increase time) Sit to supine: Modified independent (Device/Increase time)   General bed mobility comments: pt requires increased time  Transfers Overall transfer level: Needs assistance Equipment used: 1 person hand held assist;Rolling walker (2 wheeled) Transfers: Sit to/from Stand Sit to Stand: Min guard;Min assist         General transfer comment: pt perform sit<>stand 2x from EOB. first attempt [place] provided and min assist required to steady with rising. Pt with wide BOS in standing and mild tremor noted. On second attempt pt using RW, min guard for safety, pt able to rise without assist. Pt c/o dizziness with standing after several small steps.  Ambulation/Gait Ambulation/Gait assistance: Min assist Gait Distance (Feet): 2 Feet Assistive device:  Rolling walker (2 wheeled) Gait Pattern/deviations: Step-to pattern;Decreased step length - left;Decreased step length - right;Decreased stance time - left;Wide base of support     General Gait Details: pt attempted several small steps with RW, pt unsteady  requiring min assist and cues to step back towards EOB when pt reported onset of dizziness/weak feeling. Vital asssesed.  BP taken and 144/128 and HR reaching 140 bpm. pt returned to sitting and HR decresed to 110's-120's.   Stairs            Wheelchair Mobility    Modified [place] (Stroke Patients Only)       Balance Overall balance assessment: Needs assistance Sitting-balance support: Feet supported Sitting balance-Leahy Scale: Good     Standing balance support: During functional activity;Bilateral upper extremity supported;Single extremity supported Standing balance-Leahy Scale: Poor Standing balance comment: pt heavily reliant on Bil UE support reaching for therapist's shoulder and coutner in room on standing. Pt's static standing balance improved with support from RW.                 Pertinent Vitals/Pain Pain Assessment: 0-10 Pain Score: 9  Pain Location: bil knees Pain Descriptors / Indicators: Constant;Aching Pain Intervention(s): Monitored during session;Limited activity within patient's tolerance;Repositioned    Home Living Family/patient expects to be discharged to:: Private residence Living Arrangements: Spouse/significant other Available Help at Discharge: Friend(s);Available PRN/intermittently(pt reports girlfriend available intermittently) Type of Home: House Home Access: Stairs to enter Entrance Stairs-Rails: None Entrance Stairs-Number of Steps: 1 Home Layout: One level Home Equipment: Crutches;Walker - 2 wheels;Cane - single point      Prior Function Level of Independence: Independent         Comments: pt does drive some to get to food store. He only does a small amount of shopping at a time as he cannot walk far. Pt is independent with ADL's, he reports use of singel crutch or SPC in home for mobility.      Hand Dominance   Dominant Hand: Right    Extremity/Trunk Assessment   Upper Extremity Assessment Upper Extremity Assessment:  Overall WFL for tasks assessed    Lower Extremity Assessment Lower Extremity Assessment: RLE deficits/detail;LLE deficits/detail RLE Deficits / Details: Rt ankle ROM limited: AROM lacking 5 degrees from neutral (unable to dorsiflex) neutral with PROM. 4-/5 for knee extension and 3+/5 for knee flexion. LLE Deficits / Details: 4-/5 knee extension and 3+/5 for knee flexion.    Cervical / Trunk Assessment Cervical / Trunk Assessment: Normal  Communication   Communication: No difficulties  Cognition Arousal/Alertness: Awake/alert Behavior During Therapy: WFL for tasks assessed/performed Overall Cognitive Status: Within Functional Limits for tasks assessed             General Comments General comments (skin integrity, edema, etc.): After return to supine HR decresed to 90-100's. Subsequent BP taken after pt resting in supine, noted to be 136/84 and HR no in 80s.    Exercises     Assessment/Plan    PT Assessment Patient needs continued PT services  PT Problem List Decreased strength;Decreased activity tolerance;Decreased balance;Decreased mobility;Decreased knowledge of use of DME       PT Treatment Interventions DME instruction;Gait training;Stair training;Therapeutic activities;Functional mobility training;Therapeutic exercise;Balance training;Patient/family education    PT Goals (Current goals can be found in the Care Plan section)  Acute Rehab PT Goals Patient Stated Goal: get home PT Goal Formulation: With patient Time For Goal Achievement: 04/25/20 Potential to Achieve Goals: Good    Frequency Min 3X/week  Barriers to discharge Decreased caregiver support pt lives alone and reports his girlfiend works and cannot be with him at all times.        AM-PAC PT "6 Clicks" Mobility  Outcome Measure Help needed turning from your back to your side while in a flat bed without using bedrails?: None Help needed moving from lying on your back to sitting on the side of a flat  bed without using bedrails?: None Help needed moving to and from a bed to a chair (including a wheelchair)?: A Little Help needed standing up from a chair using your arms (e.g., wheelchair or bedside chair)?: A Little Help needed to walk in hospital room?: A Lot Help needed climbing 3-5 steps with a railing? : A Lot 6 Click Score: 18    End of Session Equipment Utilized During Treatment: Gait belt Activity Tolerance: Treatment limited secondary to medical complications (Comment)(elevated HR with minimal activity. ) Patient left: in bed;with call bell/phone within reach Nurse Communication: Mobility status PT Visit Diagnosis: Other abnormalities of gait and mobility (R26.89);Unsteadiness on feet (R26.81);Muscle weakness (generalized) (M62.81);Difficulty in walking, not elsewhere classified (R26.2);Dizziness and giddiness (R42)    Time: 0973-5329 PT Time Calculation (min) (ACUTE ONLY): 28 min   Charges:   PT Evaluation $PT Eval Moderate Complexity: 1 Mod PT Treatments $Therapeutic Activity: 8-22 mins       Wynn Maudlin, DPT [facility] Acute Rehabilitation Services  Office [phone] Pager [phone]  04/11/2020 12:35 PM

## 2020-04-11 NOTE — ED Notes (Addendum)
Patient is stating he is hungry, Chrissie Noa, [name] made aware.  PA wants to await MRI results.  Patient made aware.

## 2020-04-11 NOTE — ED Notes (Signed)
Ativan needed for MRI given.  Patient transported to MRI.

## 2020-04-11 NOTE — ED Provider Notes (Signed)
[facility]-EMERGENCY DEPT Provider Note   CSN: [id number] Arrival date & time: 04/10/20  2056     History Chief Complaint  Patient presents with  . Loss of Consciousness    BRIGHTEN ORNDOFF is a 44 y.o. male.  HPI   Patient was received during shift change from [facility] and HPI was gathered from her.  Patient presents to the emergency department after a syncopal episode at home, patient states he was at the refrigerator when he became lightheaded and felt dizzy.  Patient legs became weak which caused him to fall and hit the handle of the refrigerator.  Patient states he lost consciousness.  Patient has significant medical history of anxiety, depression, diabetes, hypertension, and gout.  I have spoken with the patient and evaluate him.  he is currently in no pain right now, he feels like his shakiness has improved since been given the Ativan but still feels weak on his feet.  He denies headache, fever, chills, shortness of breath, chest pain, abdominal pain but admits to some right ankle and knee discomfort.   Past Medical History:  Diagnosis Date  . Anxiety   . 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 Depression   . Diabetes mellitus    diet controlled  . Essential hypertension   . Gastritis   . Gout   . Normocytic anemia due to blood loss 08/21/2019  . Pulmonary embolism [facility])     Patient Active Problem List   Diagnosis Date Noted  . Esophagitis determined by endoscopy 08/21/2019  . Normocytic anemia due to blood loss 08/21/2019  . Hypokalemia 08/17/2019  . Hypomagnesemia 08/17/2019  . Hemoptysis 08/17/2019  . Acute upper GI bleed 08/14/2019  . Hematemesis 08/13/2019  . Cavitary lesion of lung 08/01/2019  . Gout 05/18/2019  . Peri-prosthetic fracture of femur at tip of prosthesis 05/16/2019  . Alcohol use 05/16/2019  . Intertrochanteric fracture of right hip (HCC) 04/14/2019  . Vitamin D deficiency 04/14/2019  . DTs (delirium  tremens) (HCC) 04/07/2019  . Delirium tremens (HCC) 04/07/2019  . Encephalopathy acute   . Closed right hip fracture, initial encounter (HCC) 03/25/2019  . Alcohol dependence with intoxication (HCC) 03/25/2019  . Hypertensive urgency 03/25/2019  . Thrombocytopenia (HCC) 03/25/2019  . Effusion, right knee 03/25/2019  . Closed fracture of right femur (HCC)   . Metabolic acidosis   . Prediabetes 04/02/2017  . Gastritis 04/02/2017  . Marijuana abuse 04/01/2017  . Nausea with vomiting 07/29/2014  . Type 2 diabetes mellitus with hyperlipidemia (HCC) 07/29/2014  . Essential hypertension 07/29/2014  . Nausea & vomiting 07/29/2014    Past Surgical History:  Procedure Laterality Date  . ESOPHAGOGASTRODUODENOSCOPY (EGD) WITH PROPOFOL N/A 08/14/2019   Procedure: ESOPHAGOGASTRODUODENOSCOPY (EGD) WITH PROPOFOL;  Surgeon: Charlott Rakes, MD;  Location: [facility] ENDOSCOPY;  Service: Endoscopy;  Laterality: N/A;  . EXTERNAL FIXATION LEG Left 11/07/2018   Procedure: EXTERNAL FIXATION LEFT LOWER LEG;  Surgeon: Samson Frederic, MD;  Location: [facility] ORS;  Service: Orthopedics;  Laterality: Left;  . EXTERNAL FIXATION REMOVAL Left 11/08/2018   Procedure: REMOVAL EXTERNAL FIXATION LEG;  Surgeon: Roby Lofts, MD;  Location: [facility] OR;  Service: Orthopedics;  Laterality: Left;  . INTRAMEDULLARY (IM) NAIL INTERTROCHANTERIC Right 03/26/2019   Procedure: INTRAMEDULLARY (IM) NAIL INTERTROCHANTRIC;  Surgeon: Roby Lofts, MD;  Location: [facility] OR;  Service: Orthopedics;  Laterality: Right;  . INTRAMEDULLARY (IM) NAIL INTERTROCHANTERIC Right 05/17/2019   Procedure: Intramedullary (Im) Nail Intertroch with circlage  wiring;  Surgeon: Myrene Galas, MD;  Location: [facility] OR;  Service: Orthopedics;  Laterality: Right;  . NO PAST SURGERIES    . OPEN REDUCTION INTERNAL FIXATION (ORIF) TIBIA/FIBULA FRACTURE Left 11/08/2018   Procedure: OPEN REDUCTION INTERNAL FIXATION (ORIF) TIBIA/FIBULA FRACTURE;  Surgeon: Roby Lofts, MD;  Location:  [facility] OR;  Service: Orthopedics;  Laterality: Left;  . ORIF FEMUR FRACTURE Right 05/17/2019   Procedure: REMOVAL  OF HARDWARE;  Surgeon: Myrene Galas, MD;  Location: [facility] OR;  Service: Orthopedics;  Laterality: Right;       Family History  Problem Relation Age of Onset  . Diabetes Mellitus II Father   . Diabetes Mellitus II Other   . CAD Other     Social History   Tobacco Use  . Smoking status: Former Smoker    Packs/day: 0.00    Types: Cigarettes  . Smokeless tobacco: Never Used  . Tobacco comment: About 1 cigarette per day or less  Substance Use Topics  . Alcohol use: Yes    Alcohol/week: 200.0 standard drinks    Types: 200 Cans of beer per week  . Drug use: Yes    Types: Marijuana    Home Medications Prior to Admission medications   Medication Sig Start Date End Date Taking? Authorizing Provider  busPIRone (BUSPAR) 7.5 MG tablet Take 1 tablet (7.5 mg total) by mouth 3 (three) times daily. Patient not taking: Reported on 01/12/2020 10/04/19   [name], NP  Eliquis DVT/PE Starter Pack (ELIQUIS STARTER PACK) 5 MG TABS Take as directed on package: start with two-5mg  tablets twice daily for 7 days. On day 8, switch to one-5mg  tablet twice daily. Patient not taking: Reported on 10/04/2019 08/04/19   Dorcas Carrow, MD  furosemide (LASIX) 20 MG tablet Take 1 tablet (20 mg total) by mouth daily. Patient not taking: Reported on 10/04/2019 08/22/19   [name], NP  magnesium oxide (MAG-OX) 400 (241.3 Mg) MG tablet Take 1 tablet (400 mg total) by mouth daily. Patient not taking: Reported on 01/12/2020 08/19/19   Laverna Peace, MD  Multiple Vitamin (MULTIVITAMIN WITH MINERALS) TABS tablet Take 1 tablet by mouth daily. Patient not taking: Reported on 01/12/2020 05/20/19   Glade Lloyd, MD  pantoprazole (PROTONIX) 40 MG tablet Take 1 tablet (40 mg total) by mouth 2 (two) times daily. Patient not taking: Reported on 01/12/2020 08/18/19   Roberto Scales D, MD  potassium  chloride SA (KLOR-CON) 10 MEQ tablet Take 1 tablet (10 mEq total) by mouth daily. Patient not taking: Reported on 01/12/2020 08/22/19   [name], NP    Allergies    Patient has no known allergies.  Review of Systems   Review of Systems  Constitutional: Negative for chills and fever.  HENT: Negative for congestion and sore throat.   Eyes: Negative for visual disturbance.  Respiratory: Negative for shortness of breath.   Cardiovascular: Negative for chest pain and leg swelling.  Gastrointestinal: Negative for abdominal pain, nausea and vomiting.  Genitourinary: Negative for enuresis and flank pain.  Musculoskeletal: Negative for back pain.       Patient admits to right knee pain and right ankle pain.  Skin: Negative for rash.  Neurological: Negative for dizziness and headaches.  Hematological: Does not bruise/bleed easily.    Physical Exam Updated Vital Signs BP 140/90   Pulse 80   Temp 99.8 F (37.7 C) (Oral)   Resp (!) 29   Ht 6\' 6"  (1.981 m)   Wt 100  kg   SpO2 100%   BMI 25.48 kg/m   Physical Exam Vitals and nursing note reviewed.  Constitutional:      General: He is not in acute distress.    Appearance: Normal appearance. He is not ill-appearing or diaphoretic.  HENT:     Head: Normocephalic and atraumatic.     Nose: No congestion or rhinorrhea.     Mouth/Throat:     Mouth: Mucous membranes are moist.     Pharynx: Oropharynx is clear.  Eyes:     General: No visual field deficit or scleral icterus.    Conjunctiva/sclera: Conjunctivae normal.     Pupils: Pupils are equal, round, and reactive to light.  Cardiovascular:     Rate and Rhythm: Normal rate and regular rhythm.     Pulses: Normal pulses.     Heart sounds: No murmur. No friction rub. No gallop.   Pulmonary:     Effort: Pulmonary effort is normal. No respiratory distress.     Breath sounds: No wheezing, rhonchi or rales.  Abdominal:     General: There is no distension.     Palpations:  Abdomen is soft.     Tenderness: There is no abdominal tenderness. There is no guarding.  Musculoskeletal:        General: No swelling or tenderness.  Skin:    General: Skin is warm and dry.     Capillary Refill: Capillary refill takes less than 2 seconds.  Neurological:     General: No focal deficit present.     Mental Status: He is alert and oriented to person, place, and time.     GCS: GCS eye subscore is 4. GCS verbal subscore is 5. GCS motor subscore is 6.     Cranial Nerves: Cranial nerves are intact. No cranial nerve deficit or facial asymmetry.     Sensory: Sensation is intact. No sensory deficit.     Motor: Motor function is intact. No weakness or pronator drift.     Coordination: Coordination is intact. Romberg sign negative. Finger-Nose-Finger Test and Heel to Vernon Test normal.     ED Results / Procedures / Treatments   Labs (all labs ordered are listed, but only abnormal results are displayed) Labs Reviewed  URINALYSIS, ROUTINE W REFLEX MICROSCOPIC - Abnormal; Notable for the following components:      Result Value   Color, Urine AMBER (*)    Protein, ur 30 (*)    Leukocytes,Ua SMALL (*)    All other components within normal limits  CBC WITH DIFFERENTIAL/PLATELET - Abnormal; Notable for the following components:   RBC 3.95 (*)    Hemoglobin 12.6 (*)    HCT 36.0 (*)    Platelets 79 (*)    All other components within normal limits  COMPREHENSIVE METABOLIC PANEL - Abnormal; Notable for the following components:   Sodium 131 (*)    Chloride 92 (*)    Glucose, Bld 138 (*)    Calcium 8.1 (*)    AST 111 (*)    ALT 57 (*)    Total Bilirubin 1.7 (*)    All other components within normal limits  RAPID URINE DRUG SCREEN, HOSP PERFORMED - Abnormal; Notable for the following components:   Tetrahydrocannabinol POSITIVE (*)    All other components within normal limits  CBG MONITORING, ED - Abnormal; Notable for the following components:   Glucose-Capillary 164 (*)    All  other components within normal limits  URINE CULTURE  ETHANOL  EKG EKG Interpretation  Date/Time:  Tuesday April 10 2020 21:32:28 EDT Ventricular Rate:  91 PR Interval:    QRS Duration: 82 QT Interval:  357 QTC Calculation: 440 R Axis:   67 Text Interpretation: Normal sinus rhythm Interpretation limited secondary to artifact Confirmed by Tilden Fossa [phone]) on 04/10/2020 11:24:33 PM   Radiology DG Knee 2 Views Right  Result Date: 04/10/2020 CLINICAL DATA:  Pain status post fall EXAM: RIGHT KNEE - 1-2 VIEW COMPARISON:  None. FINDINGS: There is a large suprapatellar joint effusion. The patient is status post prior intramedullary nail placement through the femur. There are end-stage degenerative changes of the right knee. There is no definite acute displaced fracture or dislocation. IMPRESSION: 1. No definite acute displaced fracture or dislocation. 2. Persistent large suprapatellar joint effusion. 3. There are end-stage degenerative changes of the right knee. Electronically Signed   By: Katherine Mantle M.D.   On: 04/10/2020 23:27   DG Ankle 2 Views Right  Result Date: 04/10/2020 CLINICAL DATA:  Pain EXAM: RIGHT ANKLE - 2 VIEW COMPARISON:  None. FINDINGS: There is no evidence of fracture, dislocation, or joint effusion. There is no evidence of arthropathy or other focal bone abnormality. Soft tissues are unremarkable. IMPRESSION: Negative. Electronically Signed   By: Katherine Mantle M.D.   On: 04/10/2020 23:29   CT Head Wo Contrast  Result Date: 04/11/2020 CLINICAL DATA:  Struck posterior head on refrigerator, loss of consciousness EXAM: CT HEAD WITHOUT CONTRAST TECHNIQUE: Contiguous axial images were obtained from the base of the skull through the vertex without intravenous contrast. COMPARISON:  CT 04/07/2019 FINDINGS: Brain: No evidence of acute infarction, hemorrhage, hydrocephalus, extra-axial collection or mass lesion/mass effect. Vascular: No hyperdense vessel or unexpected  calcification. Skull: Minimal posterior midline contusive changes without large hematoma or calvarial fracture. No suspicious osseous lesions. Sinuses/Orbits: Paranasal sinuses and mastoid air cells are predominantly clear. Included orbital structures are unremarkable. Other: None IMPRESSION: 1. Minimal posterior midline contusive changes without hematoma or calvarial fracture. 2. No acute intracranial findings. Electronically Signed   By: Kreg Shropshire M.D.   On: 04/11/2020 00:27   MR BRAIN WO CONTRAST  Result Date: 04/11/2020 CLINICAL DATA:  Ataxia. Stroke. Bilateral leg pain. Right chest pain. Head trauma with loss of consciousness. EXAM: MRI HEAD WITHOUT CONTRAST TECHNIQUE: Multiplanar, multiecho pulse sequences of the brain and surrounding structures were obtained without intravenous contrast. COMPARISON:  Head CT same day FINDINGS: Brain: Diffusion imaging does not show any acute or subacute infarction. There is generalized brain volume loss, advanced for age. No evidence of old or acute focal small or large vessel infarction. No mass lesion, hemorrhage, hydrocephalus or extra-axial collection. Vascular: Major vessels at the base of the brain show flow. Skull and upper cervical spine: Negative Sinuses/Orbits: Clear/normal Other: None IMPRESSION: No acute or traumatic finding. Generalized brain volume loss, premature for age. No evidence of small or large vessel insult. Electronically Signed   By: Paulina Fusi M.D.   On: 04/11/2020 10:19   DG Hip Unilat W or Wo Pelvis 1 View Right  Result Date: 04/10/2020 CLINICAL DATA:  Pain status post fall. EXAM: DG HIP (WITH OR WITHOUT PELVIS) 1V RIGHT COMPARISON:  April 10, 2019 FINDINGS: Patient has undergone intramedullary nail placement. The hardware appears grossly intact. There is a healed fracture of the proximal right femur. There are degenerative changes of the hips. There is no new acute displaced fracture. IMPRESSION: No acute displaced fracture or  dislocation. Electronically Signed   By: Cristal Deer  Green M.D.   On: 04/10/2020 23:28    Procedures Procedures (including critical care time)  Medications Ordered in ED Medications  ondansetron (ZOFRAN-ODT) disintegrating tablet 8 mg (8 mg Oral Given 04/10/20 2319)  sodium chloride 0.9 % bolus 1,000 mL (0 mLs Intravenous Stopped 04/11/20 0231)  oxyCODONE-acetaminophen (PERCOCET/ROXICET) 5-325 MG per tablet 1 tablet (1 tablet Oral Given 04/11/20 0126)  sodium chloride 0.9 % bolus 2,000 mL (0 mLs Intravenous Stopped 04/11/20 0649)  LORazepam (ATIVAN) injection 1 mg (1 mg Intravenous Given 04/11/20 7425)    ED Course  I have reviewed the triage vital signs and the nursing notes.  Pertinent labs & imaging results that were available during my care of the patient were reviewed by me and considered in my medical decision making (see chart for details).  Clinical Course as of Apr 11 1508  Wed Apr 11, 2020  0651 AST(!): 111 [WF]    Clinical Course User Index [WF] Aron Baba   MDM Rules/Calculators/A&P                      I have personally reviewed all imaging, labs and have interpreted them.  Patient was tachycardic and hypotensive upon arrival and was given a total of 3 L of fluids. Patient had a CT head without contrast did not show any acute abnormalities, as well as imaging of his right ankle, right hip, and right knee which did not show any acute abnormalities, no fractures, no dislocations were noted.  Labs show patient has a glucose of 164, did not show leukocytosis or  Anemia.  UA showed small leukocytes but no bacteria, patient denies urinary complaints. we will go ahead and culture.  CMP showed a elevated AST 111 and a ALT of 57 with an elevated total bilirubin of 1.7.  Patient has a history of alcohol misuse disoder and continues to drinks 2 beers daily.  Patient denies upper right quadrant pain denies nausea or vomiting, patient is not jaundice.  He also admits to an  increase shakiness in his legs possible that his elevated liver enzymes and shakiness is a result of alcohol misuse.  Patient was given Ativan which seemed to help with his shakiness.  Patient had a MRI done which did not show any acute or traumatic findings. unlikely that the patient's fall was a result of a intracranial mass or head bleed.  Unlikely that the patient's fall was a result of a electrolyte imbalance as his CMP came back without any significant findings.  Unlikely the patient's fall was a result of cardiac issue as he denies chest pain, and his EKG was sinus rhythm without signs of ischemia.  PT consult was made to further evaluate and they stated  that he should be discharged home with a walker as he is a fall risk and was weak on his feet.  patient will be referred to neurology for further evaluation and treatment.  Patient does not appear to be in any acute distress, is resting comfortably in bed, is tolerating food and water without difficulty.  It is likely that the patient's symptoms is a combination of dehydration and possible alcohol withdrawal.  Patient does not meet emergent admission criteria to the hospital.  Patient will be discharged home and instructed to follow-up with neurology as well as given at home care instructions and strict return precautions.  Patient was discussed with attending who evaluated and agrees with assessment and plan for patient.  Patient was explained  his results and plan, patient verbalized that he understood and agrees with that plan.   Final Clinical Impression(s) / ED Diagnoses Final diagnoses:  Syncope, unspecified syncope type  Dehydration  Ataxia after head trauma    Rx / DC Orders ED Discharge Orders    None       Carroll Sage, PA-C 04/11/20 1513    Bethann Berkshire, MD 04/16/20 [phone]

## 2020-04-11 NOTE — Discharge Instructions (Addendum)
You have been treated for dehydration and syncopal episode.  I have given you a walker please use for the next few days until you feel more steady on your feet.  I recommend that you continue to rehydrate as I feel you were severely dehydrated upon arrival to the hospital.  I want you to drink water and stay away from things that are high in sugar, or other diuretics like tea and coffee.  Recommend you drink water.   I want you to follow-up with neurology for further evaluation of you leg weakness.  Want to come back to emergency department if you develop shortness of breath, chest pain, severe dizziness, experience another seizure, numbness or tingling in any of your extremities slurring of your speech is this requires further evaluation.

## 2020-04-11 NOTE — Progress Notes (Signed)
TOC CM contacted [employer] for RW for home to be delivered to ED room prior dc. Isidoro Donning RN CCM, [facility] ED TOC CM [phone]

## 2020-04-11 NOTE — ED Notes (Signed)
Richard Lopez, CSW made aware patient needs walker to discharge.

## 2020-04-13 LAB — URINE CULTURE: Culture: NO GROWTH

## 2020-04-16 ENCOUNTER — Encounter (HOSPITAL_COMMUNITY): Payer: Self-pay

## 2020-04-16 ENCOUNTER — Other Ambulatory Visit: Payer: Self-pay

## 2020-04-16 DIAGNOSIS — K208 Other esophagitis without bleeding: Secondary | ICD-10-CM | POA: Diagnosis present

## 2020-04-16 DIAGNOSIS — I358 Other nonrheumatic aortic valve disorders: Secondary | ICD-10-CM | POA: Diagnosis present

## 2020-04-16 DIAGNOSIS — E1143 Type 2 diabetes mellitus with diabetic autonomic (poly)neuropathy: Secondary | ICD-10-CM | POA: Diagnosis present

## 2020-04-16 DIAGNOSIS — M009 Pyogenic arthritis, unspecified: Secondary | ICD-10-CM | POA: Diagnosis present

## 2020-04-16 DIAGNOSIS — R451 Restlessness and agitation: Secondary | ICD-10-CM | POA: Diagnosis not present

## 2020-04-16 DIAGNOSIS — Z86718 Personal history of other venous thrombosis and embolism: Secondary | ICD-10-CM

## 2020-04-16 DIAGNOSIS — Z781 Physical restraint status: Secondary | ICD-10-CM

## 2020-04-16 DIAGNOSIS — E1165 Type 2 diabetes mellitus with hyperglycemia: Secondary | ICD-10-CM | POA: Diagnosis present

## 2020-04-16 DIAGNOSIS — D638 Anemia in other chronic diseases classified elsewhere: Secondary | ICD-10-CM | POA: Diagnosis present

## 2020-04-16 DIAGNOSIS — E871 Hypo-osmolality and hyponatremia: Secondary | ICD-10-CM | POA: Diagnosis present

## 2020-04-16 DIAGNOSIS — I10 Essential (primary) hypertension: Secondary | ICD-10-CM | POA: Diagnosis present

## 2020-04-16 DIAGNOSIS — F419 Anxiety disorder, unspecified: Secondary | ICD-10-CM | POA: Diagnosis present

## 2020-04-16 DIAGNOSIS — F329 Major depressive disorder, single episode, unspecified: Secondary | ICD-10-CM | POA: Diagnosis present

## 2020-04-16 DIAGNOSIS — F10231 Alcohol dependence with withdrawal delirium: Principal | ICD-10-CM | POA: Diagnosis present

## 2020-04-16 DIAGNOSIS — E860 Dehydration: Secondary | ICD-10-CM | POA: Diagnosis present

## 2020-04-16 DIAGNOSIS — B9689 Other specified bacterial agents as the cause of diseases classified elsewhere: Secondary | ICD-10-CM | POA: Diagnosis present

## 2020-04-16 DIAGNOSIS — B957 Other staphylococcus as the cause of diseases classified elsewhere: Secondary | ICD-10-CM | POA: Diagnosis present

## 2020-04-16 DIAGNOSIS — Z8719 Personal history of other diseases of the digestive system: Secondary | ICD-10-CM

## 2020-04-16 DIAGNOSIS — J690 Pneumonitis due to inhalation of food and vomit: Secondary | ICD-10-CM | POA: Diagnosis present

## 2020-04-16 DIAGNOSIS — D6489 Other specified anemias: Secondary | ICD-10-CM | POA: Diagnosis present

## 2020-04-16 DIAGNOSIS — Z833 Family history of diabetes mellitus: Secondary | ICD-10-CM

## 2020-04-16 DIAGNOSIS — I493 Ventricular premature depolarization: Secondary | ICD-10-CM | POA: Diagnosis present

## 2020-04-16 DIAGNOSIS — M109 Gout, unspecified: Secondary | ICD-10-CM | POA: Diagnosis present

## 2020-04-16 DIAGNOSIS — M25461 Effusion, right knee: Secondary | ICD-10-CM | POA: Diagnosis present

## 2020-04-16 DIAGNOSIS — M170 Bilateral primary osteoarthritis of knee: Secondary | ICD-10-CM | POA: Diagnosis present

## 2020-04-16 DIAGNOSIS — Z87891 Personal history of nicotine dependence: Secondary | ICD-10-CM

## 2020-04-16 DIAGNOSIS — I4819 Other persistent atrial fibrillation: Secondary | ICD-10-CM | POA: Diagnosis present

## 2020-04-16 DIAGNOSIS — M25462 Effusion, left knee: Secondary | ICD-10-CM | POA: Diagnosis present

## 2020-04-16 DIAGNOSIS — Z8249 Family history of ischemic heart disease and other diseases of the circulatory system: Secondary | ICD-10-CM

## 2020-04-16 DIAGNOSIS — E876 Hypokalemia: Secondary | ICD-10-CM | POA: Diagnosis present

## 2020-04-16 DIAGNOSIS — I2782 Chronic pulmonary embolism: Secondary | ICD-10-CM | POA: Diagnosis present

## 2020-04-16 DIAGNOSIS — K3184 Gastroparesis: Secondary | ICD-10-CM | POA: Diagnosis present

## 2020-04-16 DIAGNOSIS — E1169 Type 2 diabetes mellitus with other specified complication: Secondary | ICD-10-CM | POA: Diagnosis present

## 2020-04-16 DIAGNOSIS — E785 Hyperlipidemia, unspecified: Secondary | ICD-10-CM | POA: Diagnosis present

## 2020-04-16 DIAGNOSIS — Z20822 Contact with and (suspected) exposure to covid-19: Secondary | ICD-10-CM | POA: Diagnosis present

## 2020-04-16 LAB — CBC
HCT: 30.8 % — ABNORMAL LOW (ref 39.0–52.0)
Hemoglobin: 11 g/dL — ABNORMAL LOW (ref 13.0–17.0)
MCH: 31.8 pg (ref 26.0–34.0)
MCHC: 35.7 g/dL (ref 30.0–36.0)
MCV: 89 fL (ref 80.0–100.0)
Platelets: 308 10*3/uL (ref 150–400)
RBC: 3.46 MIL/uL — ABNORMAL LOW (ref 4.22–5.81)
RDW: 13.6 % (ref 11.5–15.5)
WBC: 5.9 10*3/uL (ref 4.0–10.5)
nRBC: 0 % (ref 0.0–0.2)

## 2020-04-16 LAB — COMPREHENSIVE METABOLIC PANEL
ALT: 45 U/L — ABNORMAL HIGH (ref 0–44)
AST: 87 U/L — ABNORMAL HIGH (ref 15–41)
Albumin: 2.9 g/dL — ABNORMAL LOW (ref 3.5–5.0)
Alkaline Phosphatase: 60 U/L (ref 38–126)
Anion gap: 17 — ABNORMAL HIGH (ref 5–15)
BUN: 12 mg/dL (ref 6–20)
CO2: 22 mmol/L (ref 22–32)
Calcium: 7.5 mg/dL — ABNORMAL LOW (ref 8.9–10.3)
Chloride: 90 mmol/L — ABNORMAL LOW (ref 98–111)
Creatinine, Ser: 0.72 mg/dL (ref 0.61–1.24)
GFR calc Af Amer: >60 mL/min (ref >60)
GFR calc non Af Amer: >60 mL/min (ref >60)
Glucose, Bld: 127 mg/dL — ABNORMAL HIGH (ref 70–99)
Potassium: 2.6 mmol/L — CL (ref 3.5–5.1)
Sodium: 129 mmol/L — ABNORMAL LOW (ref 135–145)
Total Bilirubin: 1.1 mg/dL (ref 0.3–1.2)
Total Protein: 7.4 g/dL (ref 6.5–8.1)

## 2020-04-16 LAB — ACETAMINOPHEN LEVEL: Acetaminophen (Tylenol), Serum: <10 ug/mL — ABNORMAL LOW (ref 10–30)

## 2020-04-16 LAB — SALICYLATE LEVEL: Salicylate Lvl: <7 mg/dL — ABNORMAL LOW (ref 7.0–30.0)

## 2020-04-16 LAB — ETHANOL: Alcohol, Ethyl (B): <10 mg/dL (ref <10)

## 2020-04-16 NOTE — ED Triage Notes (Signed)
Patient arrived stating he feels dehydrated. Reports taking his girlfriends gabapentin and states he has been auditory hallucinations over the last two days. States he was discharged Thursday and has not had a drink since. Reports NV

## 2020-04-17 ENCOUNTER — Inpatient Hospital Stay (HOSPITAL_COMMUNITY)
Admission: EM | Admit: 2020-04-17 | Discharge: 2020-04-23 | DRG: 896 | Disposition: A | Discharge status: Home / Self Care | Payer: Self-pay | Attending: Internal Medicine | Admitting: Internal Medicine

## 2020-04-17 ENCOUNTER — Encounter (HOSPITAL_COMMUNITY)
Admission: EM | Disposition: A | Discharge status: Home / Self Care | Payer: Self-pay | Source: Home / Self Care | Attending: Pulmonary Disease

## 2020-04-17 ENCOUNTER — Encounter (HOSPITAL_COMMUNITY): Payer: Self-pay | Admitting: Anesthesiology

## 2020-04-17 ENCOUNTER — Emergency Department (HOSPITAL_COMMUNITY): Payer: Self-pay

## 2020-04-17 ENCOUNTER — Inpatient Hospital Stay (HOSPITAL_COMMUNITY): Payer: Self-pay

## 2020-04-17 DIAGNOSIS — F1090 Alcohol use, unspecified, uncomplicated: Secondary | ICD-10-CM

## 2020-04-17 DIAGNOSIS — E1169 Type 2 diabetes mellitus with other specified complication: Secondary | ICD-10-CM | POA: Diagnosis present

## 2020-04-17 DIAGNOSIS — E785 Hyperlipidemia, unspecified: Secondary | ICD-10-CM | POA: Diagnosis present

## 2020-04-17 DIAGNOSIS — E871 Hypo-osmolality and hyponatremia: Secondary | ICD-10-CM

## 2020-04-17 DIAGNOSIS — R609 Edema, unspecified: Secondary | ICD-10-CM

## 2020-04-17 DIAGNOSIS — Z789 Other specified health status: Secondary | ICD-10-CM

## 2020-04-17 DIAGNOSIS — M170 Bilateral primary osteoarthritis of knee: Secondary | ICD-10-CM

## 2020-04-17 DIAGNOSIS — J690 Pneumonitis due to inhalation of food and vomit: Secondary | ICD-10-CM

## 2020-04-17 DIAGNOSIS — M009 Pyogenic arthritis, unspecified: Secondary | ICD-10-CM

## 2020-04-17 DIAGNOSIS — F10231 Alcohol dependence with withdrawal delirium: Principal | ICD-10-CM

## 2020-04-17 DIAGNOSIS — G9340 Encephalopathy, unspecified: Secondary | ICD-10-CM

## 2020-04-17 DIAGNOSIS — I4891 Unspecified atrial fibrillation: Secondary | ICD-10-CM

## 2020-04-17 DIAGNOSIS — I10 Essential (primary) hypertension: Secondary | ICD-10-CM | POA: Diagnosis present

## 2020-04-17 DIAGNOSIS — F10931 Alcohol use, unspecified with withdrawal delirium: Secondary | ICD-10-CM | POA: Diagnosis present

## 2020-04-17 LAB — RAPID URINE DRUG SCREEN, HOSP PERFORMED
Amphetamines: NOT DETECTED
Barbiturates: NOT DETECTED
Benzodiazepines: NOT DETECTED
Cocaine: NOT DETECTED
Opiates: NOT DETECTED
Tetrahydrocannabinol: NOT DETECTED

## 2020-04-17 LAB — URINALYSIS, ROUTINE W REFLEX MICROSCOPIC
Bacteria, UA: NONE SEEN
Bilirubin Urine: NEGATIVE
Glucose, UA: NEGATIVE mg/dL
Hgb urine dipstick: NEGATIVE
Ketones, ur: 5 mg/dL — AB
Nitrite: NEGATIVE
Protein, ur: NEGATIVE mg/dL
Specific Gravity, Urine: 1.016 (ref 1.005–1.030)
pH: 6 (ref 5.0–8.0)

## 2020-04-17 LAB — COMPREHENSIVE METABOLIC PANEL
ALT: 37 U/L (ref 0–44)
AST: 62 U/L — ABNORMAL HIGH (ref 15–41)
Albumin: 2.4 g/dL — ABNORMAL LOW (ref 3.5–5.0)
Alkaline Phosphatase: 51 U/L (ref 38–126)
Anion gap: 13 (ref 5–15)
BUN: 12 mg/dL (ref 6–20)
CO2: 22 mmol/L (ref 22–32)
Calcium: 6.9 mg/dL — ABNORMAL LOW (ref 8.9–10.3)
Chloride: 98 mmol/L (ref 98–111)
Creatinine, Ser: 0.61 mg/dL (ref 0.61–1.24)
GFR calc Af Amer: >60 mL/min (ref >60)
GFR calc non Af Amer: >60 mL/min (ref >60)
Glucose, Bld: 125 mg/dL — ABNORMAL HIGH (ref 70–99)
Potassium: 2.6 mmol/L — CL (ref 3.5–5.1)
Sodium: 133 mmol/L — ABNORMAL LOW (ref 135–145)
Total Bilirubin: 1.2 mg/dL (ref 0.3–1.2)
Total Protein: 6.2 g/dL — ABNORMAL LOW (ref 6.5–8.1)

## 2020-04-17 LAB — SYNOVIAL CELL COUNT + DIFF, W/ CRYSTALS
Crystals, Fluid: NONE SEEN
Lymphocytes-Synovial Fld: 2 % (ref 0–20)
Monocyte-Macrophage-Synovial Fluid: 9 % — ABNORMAL LOW (ref 50–90)
Neutrophil, Synovial: 89 % — ABNORMAL HIGH (ref 0–25)
WBC, Synovial: 7182 /mm3 — ABNORMAL HIGH (ref 0–200)

## 2020-04-17 LAB — CBC
HCT: 28.5 % — ABNORMAL LOW (ref 39.0–52.0)
Hemoglobin: 9.9 g/dL — ABNORMAL LOW (ref 13.0–17.0)
MCH: 31.4 pg (ref 26.0–34.0)
MCHC: 34.7 g/dL (ref 30.0–36.0)
MCV: 90.5 fL (ref 80.0–100.0)
Platelets: 272 10*3/uL (ref 150–400)
RBC: 3.15 MIL/uL — ABNORMAL LOW (ref 4.22–5.81)
RDW: 13.9 % (ref 11.5–15.5)
WBC: 5.2 10*3/uL (ref 4.0–10.5)
nRBC: 0 % (ref 0.0–0.2)

## 2020-04-17 LAB — GRAM STAIN

## 2020-04-17 LAB — MAGNESIUM
Magnesium: 1 mg/dL — ABNORMAL LOW (ref 1.7–2.4)
Magnesium: 1.6 mg/dL — ABNORMAL LOW (ref 1.7–2.4)
Magnesium: 1.9 mg/dL (ref 1.7–2.4)

## 2020-04-17 LAB — BASIC METABOLIC PANEL
Anion gap: 9 (ref 5–15)
BUN: 10 mg/dL (ref 6–20)
CO2: 24 mmol/L (ref 22–32)
Calcium: 7.2 mg/dL — ABNORMAL LOW (ref 8.9–10.3)
Chloride: 101 mmol/L (ref 98–111)
Creatinine, Ser: 0.58 mg/dL — ABNORMAL LOW (ref 0.61–1.24)
GFR calc Af Amer: >60 mL/min (ref >60)
GFR calc non Af Amer: >60 mL/min (ref >60)
Glucose, Bld: 195 mg/dL — ABNORMAL HIGH (ref 70–99)
Potassium: 3 mmol/L — ABNORMAL LOW (ref 3.5–5.1)
Sodium: 134 mmol/L — ABNORMAL LOW (ref 135–145)

## 2020-04-17 LAB — BLOOD GAS, ARTERIAL
Acid-Base Excess: 1.3 mmol/L (ref 0.0–2.0)
Bicarbonate: 24.1 mmol/L (ref 20.0–28.0)
FIO2: 21
O2 Saturation: 98.6 %
Patient temperature: 98.6
pCO2 arterial: 32.4 mmHg (ref 32.0–48.0)
pH, Arterial: 7.483 — ABNORMAL HIGH (ref 7.350–7.450)
pO2, Arterial: 144 mmHg — ABNORMAL HIGH (ref 83.0–108.0)

## 2020-04-17 LAB — PHOSPHORUS: Phosphorus: 3.7 mg/dL (ref 2.5–4.6)

## 2020-04-17 LAB — LACTIC ACID, PLASMA
Lactic Acid, Venous: 2.1 mmol/L (ref 0.5–1.9)
Lactic Acid, Venous: 1.4 mmol/L (ref 0.5–1.9)

## 2020-04-17 LAB — TSH: TSH: 0.45 u[IU]/mL (ref 0.350–4.500)

## 2020-04-17 LAB — CK: Total CK: 328 U/L (ref 49–397)

## 2020-04-17 LAB — AMMONIA: Ammonia: 29 umol/L (ref 9–35)

## 2020-04-17 LAB — SARS CORONAVIRUS 2 BY RT PCR (HOSPITAL ORDER, PERFORMED IN ~~LOC~~ HOSPITAL LAB): SARS Coronavirus 2: NEGATIVE

## 2020-04-17 LAB — MRSA PCR SCREENING: MRSA by PCR: NEGATIVE

## 2020-04-17 SURGERY — ARTHROSCOPY, KNEE
Anesthesia: General | Site: Knee | Laterality: Left

## 2020-04-17 MED ORDER — THIAMINE HCL 100 MG PO TABS: 100.0000 mg | ORAL_TABLET | Freq: Every day | ORAL | Status: DC

## 2020-04-17 MED ORDER — PANTOPRAZOLE SODIUM 40 MG PO TBEC
40.0000 mg | DELAYED_RELEASE_TABLET | Freq: Every day | ORAL | Status: DC
  Administered 2020-04-18 – 2020-04-23 (×6): 40 mg via ORAL
  Filled 2020-04-17 (×7): qty 1

## 2020-04-17 MED ORDER — DEXTROSE-NACL 5-0.9 % IV SOLN: INTRAVENOUS | Status: DC

## 2020-04-17 MED ORDER — ENOXAPARIN SODIUM 40 MG/0.4ML ~~LOC~~ SOLN
40.0000 mg | SUBCUTANEOUS | Status: DC
  Administered 2020-04-17 – 2020-04-20 (×4): 40 mg via SUBCUTANEOUS
  Filled 2020-04-17 (×4): qty 0.4

## 2020-04-17 MED ORDER — ACETAMINOPHEN 325 MG PO TABS: 650.0000 mg | ORAL_TABLET | Freq: Four times a day (QID) | ORAL | Status: DC | PRN

## 2020-04-17 MED ORDER — DIPHENHYDRAMINE HCL 50 MG/ML IJ SOLN
25.0000 mg | Freq: Once | INTRAMUSCULAR | Status: AC
  Administered 2020-04-17: 25 mg via INTRAVENOUS
  Filled 2020-04-17: qty 1

## 2020-04-17 MED ORDER — LORAZEPAM 2 MG/ML IJ SOLN: 0.0000 mg | Freq: Two times a day (BID) | INTRAMUSCULAR | Status: DC

## 2020-04-17 MED ORDER — MAGNESIUM SULFATE 2 GM/50ML IV SOLN
2.0000 g | Freq: Once | INTRAVENOUS | Status: AC
  Administered 2020-04-17: 2 g via INTRAVENOUS
  Filled 2020-04-17: qty 50

## 2020-04-17 MED ORDER — CHLORHEXIDINE GLUCONATE 0.12 % MT SOLN
15.0000 mL | Freq: Two times a day (BID) | OROMUCOSAL | Status: DC
  Administered 2020-04-17 – 2020-04-23 (×11): 15 mL via OROMUCOSAL
  Filled 2020-04-17 (×10): qty 15

## 2020-04-17 MED ORDER — POTASSIUM CHLORIDE 10 MEQ/100ML IV SOLN
10.0000 meq | INTRAVENOUS | Status: AC
  Administered 2020-04-17 (×6): 10 meq via INTRAVENOUS
  Filled 2020-04-17 (×3): qty 100

## 2020-04-17 MED ORDER — SODIUM CHLORIDE (PF) 0.9 % IJ SOLN
INTRAMUSCULAR | Status: AC
  Filled 2020-04-17: qty 50

## 2020-04-17 MED ORDER — FOLIC ACID 1 MG PO TABS: 1.0000 mg | ORAL_TABLET | Freq: Every day | ORAL | Status: DC

## 2020-04-17 MED ORDER — IOHEXOL 350 MG/ML SOLN
100.0000 mL | Freq: Once | INTRAVENOUS | Status: AC | PRN
  Administered 2020-04-17: 100 mL via INTRAVENOUS

## 2020-04-17 MED ORDER — FENTANYL CITRATE (PF) 100 MCG/2ML IJ SOLN
50.0000 ug | INTRAMUSCULAR | Status: DC | PRN
  Administered 2020-04-17 – 2020-04-19 (×2): 50 ug via INTRAVENOUS
  Filled 2020-04-17 (×2): qty 2

## 2020-04-17 MED ORDER — CHLORHEXIDINE GLUCONATE CLOTH 2 % EX PADS
6.0000 | MEDICATED_PAD | Freq: Every day | CUTANEOUS | Status: DC
  Administered 2020-04-17 – 2020-04-18 (×2): 6 via TOPICAL

## 2020-04-17 MED ORDER — LORAZEPAM 1 MG PO TABS: 1.0000 mg | ORAL_TABLET | ORAL | Status: DC | PRN

## 2020-04-17 MED ORDER — HYDRALAZINE HCL 20 MG/ML IJ SOLN
10.0000 mg | INTRAMUSCULAR | Status: DC | PRN
  Administered 2020-04-19 – 2020-04-21 (×4): 10 mg via INTRAVENOUS
  Filled 2020-04-17 (×6): qty 1

## 2020-04-17 MED ORDER — PIPERACILLIN-TAZOBACTAM 3.375 G IVPB 30 MIN
3.3750 g | Freq: Once | INTRAVENOUS | Status: AC
  Administered 2020-04-17: 3.375 g via INTRAVENOUS
  Filled 2020-04-17: qty 50

## 2020-04-17 MED ORDER — FOLIC ACID 1 MG PO TABS
1.0000 mg | ORAL_TABLET | Freq: Every day | ORAL | Status: DC
  Administered 2020-04-18 – 2020-04-23 (×6): 1 mg via ORAL
  Filled 2020-04-17 (×7): qty 1

## 2020-04-17 MED ORDER — POTASSIUM CHLORIDE 10 MEQ/100ML IV SOLN
10.0000 meq | INTRAVENOUS | Status: AC
  Administered 2020-04-17 – 2020-04-18 (×4): 10 meq via INTRAVENOUS
  Filled 2020-04-17 (×4): qty 100

## 2020-04-17 MED ORDER — THIAMINE HCL 100 MG/ML IJ SOLN: 100.0000 mg | Freq: Every day | INTRAMUSCULAR | Status: DC

## 2020-04-17 MED ORDER — ADULT MULTIVITAMIN W/MINERALS CH: 1.0000 | ORAL_TABLET | Freq: Every day | ORAL | Status: DC

## 2020-04-17 MED ORDER — ORAL CARE MOUTH RINSE
15.0000 mL | Freq: Two times a day (BID) | OROMUCOSAL | Status: DC
  Administered 2020-04-17 – 2020-04-23 (×9): 15 mL via OROMUCOSAL

## 2020-04-17 MED ORDER — LIDOCAINE-EPINEPHRINE (PF) 2 %-1:200000 IJ SOLN
20.0000 mL | Freq: Once | INTRAMUSCULAR | Status: AC
  Administered 2020-04-17: 20 mL
  Filled 2020-04-17: qty 20

## 2020-04-17 MED ORDER — SODIUM CHLORIDE 0.9 % IV SOLN
2.0000 g | INTRAVENOUS | Status: DC
  Administered 2020-04-17 – 2020-04-21 (×5): 2 g via INTRAVENOUS
  Filled 2020-04-17 (×4): qty 20
  Filled 2020-04-17: qty 2

## 2020-04-17 MED ORDER — SODIUM CHLORIDE 0.9 % IV BOLUS
1000.0000 mL | Freq: Once | INTRAVENOUS | Status: AC
  Administered 2020-04-17: 1000 mL via INTRAVENOUS

## 2020-04-17 MED ORDER — VANCOMYCIN HCL IN DEXTROSE 1-5 GM/200ML-% IV SOLN
1000.0000 mg | Freq: Three times a day (TID) | INTRAVENOUS | Status: DC
  Administered 2020-04-17 – 2020-04-18 (×3): 1000 mg via INTRAVENOUS
  Filled 2020-04-17 (×3): qty 200

## 2020-04-17 MED ORDER — CHLORHEXIDINE GLUCONATE CLOTH 2 % EX PADS: 6.0000 | MEDICATED_PAD | Freq: Every day | CUTANEOUS | Status: DC

## 2020-04-17 MED ORDER — DEXMEDETOMIDINE HCL IN NACL 200 MCG/50ML IV SOLN
0.2000 ug/kg/h | INTRAVENOUS | Status: DC
  Administered 2020-04-17: 0.6 ug/kg/h via INTRAVENOUS
  Administered 2020-04-17: 0.2 ug/kg/h via INTRAVENOUS
  Administered 2020-04-18: 0.4 ug/kg/h via INTRAVENOUS
  Administered 2020-04-18: 0.5 ug/kg/h via INTRAVENOUS
  Administered 2020-04-18: 0.4 ug/kg/h via INTRAVENOUS
  Administered 2020-04-18: 0.7 ug/kg/h via INTRAVENOUS
  Administered 2020-04-18: 0.5 ug/kg/h via INTRAVENOUS
  Administered 2020-04-19: 0.8 ug/kg/h via INTRAVENOUS
  Administered 2020-04-19: 0.6 ug/kg/h via INTRAVENOUS
  Administered 2020-04-19: 0.5 ug/kg/h via INTRAVENOUS
  Administered 2020-04-19: 0.7 ug/kg/h via INTRAVENOUS
  Administered 2020-04-19: 0.6 ug/kg/h via INTRAVENOUS
  Administered 2020-04-19: 0.7 ug/kg/h via INTRAVENOUS
  Administered 2020-04-20: 0.3 ug/kg/h via INTRAVENOUS
  Administered 2020-04-20 (×2): 0.5 ug/kg/h via INTRAVENOUS
  Filled 2020-04-17 (×16): qty 50

## 2020-04-17 MED ORDER — LORAZEPAM 2 MG/ML IJ SOLN
1.0000 mg | INTRAMUSCULAR | Status: DC | PRN
  Administered 2020-04-17: 3 mg via INTRAVENOUS

## 2020-04-17 MED ORDER — VANCOMYCIN HCL 2000 MG/400ML IV SOLN
2000.0000 mg | Freq: Once | INTRAVENOUS | Status: AC
  Administered 2020-04-17: 2000 mg via INTRAVENOUS
  Filled 2020-04-17: qty 400

## 2020-04-17 MED ORDER — LORAZEPAM 2 MG/ML IJ SOLN
0.0000 mg | Freq: Four times a day (QID) | INTRAMUSCULAR | Status: DC
  Administered 2020-04-17: 1 mg via INTRAVENOUS
  Filled 2020-04-17: qty 2
  Filled 2020-04-17: qty 1

## 2020-04-17 MED ORDER — DEXMEDETOMIDINE HCL IN NACL 400 MCG/100ML IV SOLN
0.4000 ug/kg/h | INTRAVENOUS | Status: DC
  Administered 2020-04-17: 0.4 ug/kg/h via INTRAVENOUS
  Filled 2020-04-17: qty 100

## 2020-04-17 MED ORDER — LORAZEPAM 2 MG/ML IJ SOLN
1.0000 mg | INTRAMUSCULAR | Status: DC | PRN
  Administered 2020-04-18: 2 mg via INTRAVENOUS
  Administered 2020-04-18: 1 mg via INTRAVENOUS
  Administered 2020-04-19 – 2020-04-21 (×4): 2 mg via INTRAVENOUS
  Filled 2020-04-17 (×6): qty 1

## 2020-04-17 MED ORDER — ONDANSETRON HCL 4 MG/2ML IJ SOLN
4.0000 mg | Freq: Once | INTRAMUSCULAR | Status: AC
  Administered 2020-04-17: 4 mg via INTRAVENOUS
  Filled 2020-04-17: qty 2

## 2020-04-17 MED ORDER — THIAMINE HCL 100 MG PO TABS
100.0000 mg | ORAL_TABLET | Freq: Every day | ORAL | Status: DC
  Administered 2020-04-18 – 2020-04-23 (×6): 100 mg via ORAL
  Filled 2020-04-17 (×7): qty 1

## 2020-04-17 MED ORDER — PROMETHAZINE HCL 25 MG/ML IJ SOLN
25.0000 mg | Freq: Once | INTRAMUSCULAR | Status: DC
  Filled 2020-04-17: qty 1

## 2020-04-17 MED ORDER — ADULT MULTIVITAMIN W/MINERALS CH
1.0000 | ORAL_TABLET | Freq: Every day | ORAL | Status: DC
  Administered 2020-04-18 – 2020-04-23 (×6): 1 via ORAL
  Filled 2020-04-17 (×7): qty 1

## 2020-04-17 MED ORDER — HALOPERIDOL LACTATE 5 MG/ML IJ SOLN
5.0000 mg | Freq: Once | INTRAMUSCULAR | Status: AC
  Administered 2020-04-17: 5 mg via INTRAVENOUS
  Filled 2020-04-17: qty 1

## 2020-04-17 MED ORDER — POTASSIUM CHLORIDE 10 MEQ/100ML IV SOLN
10.0000 meq | INTRAVENOUS | Status: AC
  Administered 2020-04-17 (×3): 10 meq via INTRAVENOUS
  Filled 2020-04-17 (×3): qty 100

## 2020-04-17 NOTE — ED Notes (Signed)
Pt voided during CT, still unable to obtain Urine

## 2020-04-17 NOTE — Progress Notes (Signed)
Attempted venous duplex in the ED, patient currently transferring up to ICU. Will check back later.  Jeb Levering, BS, RDMS, RVT

## 2020-04-17 NOTE — Progress Notes (Signed)
Asked to assist with care by Dr. Eulah Pont.  XR with severe arthritis.    Cell count from knee fluid with 7,000 cells.   Initial gram stain indicated gram positive cocci.    I have called the [facility] laboratory, who has overread the slides, and indicated NO GRAM POSITIVE COCCI, and there was some clumped dye which could have been misread.   Patient is in life threatening florid DTs, and I would recommend cancelling surgery pending more definitive infectious data.  If the culture comes back positive, then consider arthroscopic intervention if he is medically stable.   Will follow.   Eulas Post, MD

## 2020-04-17 NOTE — ED Provider Notes (Signed)
[facility]-EMERGENCY DEPT Provider Note   CSN: [id number] Arrival date & time: 04/16/20  2048     History Chief Complaint  Patient presents with  . Hallucinations  . Dehydration    Richard Lopez is a 44 y.o. male.  Patient with history of diet-controlled diabetes, gout, hypertension, previous orthopedic surgeries, pulmonary embolism not on anticoagulation due to cost presenting with complaining of feeling dehydrated for the past several days.  States has been having increased thirst, lightheadedness, dizziness, dark urine with decreased p.o. intake and output.  States he was seen in the ED on June 9 after syncopal episode and discharged.  States he has had no alcohol intake since then.  His last drink was approximately 1 week ago.  He does drink about 3 beers on a daily basis and has had withdrawal symptoms in the past.  He has had intermittent nausea and vomiting but no fever.  No chest pain or shortness of breath. He has increased pain and swelling to his left knee.  He thinks this is due to gout.  He denies any new fall or trauma since he was last seen in the ED.  Today he started seeing things that are not there and hearing voices that are not there.  He believes this is due to taking his girlfriends gabapentin accidentally.  He denies any thoughts of self-harm.  The history is provided by the patient.       Past Medical History:  Diagnosis Date  . Anxiety   . 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 Depression   . Diabetes mellitus    diet controlled  . Essential hypertension   . Gastritis   . Gout   . Normocytic anemia due to blood loss 08/21/2019  . Pulmonary embolism [facility])     Patient Active Problem List   Diagnosis Date Noted  . Esophagitis determined by endoscopy 08/21/2019  . Normocytic anemia due to blood loss 08/21/2019  . Hypokalemia 08/17/2019  . Hypomagnesemia 08/17/2019  . Hemoptysis 08/17/2019  . Acute upper GI bleed 08/14/2019    . Hematemesis 08/13/2019  . Cavitary lesion of lung 08/01/2019  . Gout 05/18/2019  . Peri-prosthetic fracture of femur at tip of prosthesis 05/16/2019  . Alcohol use 05/16/2019  . Intertrochanteric fracture of right hip (HCC) 04/14/2019  . Vitamin D deficiency 04/14/2019  . DTs (delirium tremens) (HCC) 04/07/2019  . Delirium tremens (HCC) 04/07/2019  . Encephalopathy acute   . Closed right hip fracture, initial encounter (HCC) 03/25/2019  . Alcohol dependence with intoxication (HCC) 03/25/2019  . Hypertensive urgency 03/25/2019  . Thrombocytopenia (HCC) 03/25/2019  . Effusion, right knee 03/25/2019  . Closed fracture of right femur (HCC)   . Metabolic acidosis   . Prediabetes 04/02/2017  . Gastritis 04/02/2017  . Marijuana abuse 04/01/2017  . Nausea with vomiting 07/29/2014  . Type 2 diabetes mellitus with hyperlipidemia (HCC) 07/29/2014  . Essential hypertension 07/29/2014  . Nausea & vomiting 07/29/2014    Past Surgical History:  Procedure Laterality Date  . ESOPHAGOGASTRODUODENOSCOPY (EGD) WITH PROPOFOL N/A 08/14/2019   Procedure: ESOPHAGOGASTRODUODENOSCOPY (EGD) WITH PROPOFOL;  Surgeon: Charlott Rakes, MD;  Location: [facility];  Service: Endoscopy;  Laterality: N/A;  . EXTERNAL FIXATION LEG Left 11/07/2018   Procedure: EXTERNAL FIXATION LEFT LOWER LEG;  Surgeon: Samson Frederic, MD;  Location: [facility] ORS;  Service: Orthopedics;  Laterality: Left;  . EXTERNAL FIXATION REMOVAL Left 11/08/2018   Procedure: REMOVAL EXTERNAL FIXATION LEG;  Surgeon: Jena Gauss,  Gillie Manners, MD;  Location: [facility] OR;  Service: Orthopedics;  Laterality: Left;  . INTRAMEDULLARY (IM) NAIL INTERTROCHANTERIC Right 03/26/2019   Procedure: INTRAMEDULLARY (IM) NAIL INTERTROCHANTRIC;  Surgeon: Roby Lofts, MD;  Location: [facility] OR;  Service: Orthopedics;  Laterality: Right;  . INTRAMEDULLARY (IM) NAIL INTERTROCHANTERIC Right 05/17/2019   Procedure: Intramedullary (Im) Nail Intertroch with circlage wiring;  Surgeon: Myrene Galas, MD;  Location: [facility] OR;  Service: Orthopedics;  Laterality: Right;  . NO PAST SURGERIES    . OPEN REDUCTION INTERNAL FIXATION (ORIF) TIBIA/FIBULA FRACTURE Left 11/08/2018   Procedure: OPEN REDUCTION INTERNAL FIXATION (ORIF) TIBIA/FIBULA FRACTURE;  Surgeon: Roby Lofts, MD;  Location: [facility] OR;  Service: Orthopedics;  Laterality: Left;  . ORIF FEMUR FRACTURE Right 05/17/2019   Procedure: REMOVAL  OF HARDWARE;  Surgeon: Myrene Galas, MD;  Location: [facility] OR;  Service: Orthopedics;  Laterality: Right;       Family History  Problem Relation Age of Onset  . Diabetes Mellitus II Father   . Diabetes Mellitus II Other   . CAD Other     Social History   Tobacco Use  . Smoking status: Former Smoker    Packs/day: 0.00    Types: Cigarettes  . Smokeless tobacco: Never Used  . Tobacco comment: About 1 cigarette per day or less  Vaping Use  . Vaping Use: Never used  Substance Use Topics  . Alcohol use: Yes    Alcohol/week: 200.0 standard drinks    Types: 200 Cans of beer per week  . Drug use: Yes    Types: Marijuana    Home Medications Prior to Admission medications   Medication Sig Start Date End Date Taking? Authorizing Provider  busPIRone (BUSPAR) 7.5 MG tablet Take 1 tablet (7.5 mg total) by mouth 3 (three) times daily. Patient not taking: Reported on 01/12/2020 10/04/19   [name], NP  Eliquis DVT/PE Starter Pack (ELIQUIS STARTER PACK) 5 MG TABS Take as directed on package: start with two-5mg  tablets twice daily for 7 days. On day 8, switch to one-5mg  tablet twice daily. Patient not taking: Reported on 10/04/2019 08/04/19   Dorcas Carrow, MD  furosemide (LASIX) 20 MG tablet Take 1 tablet (20 mg total) by mouth daily. Patient not taking: Reported on 10/04/2019 08/22/19   [name], NP  magnesium oxide (MAG-OX) 400 (241.3 Mg) MG tablet Take 1 tablet (400 mg total) by mouth daily. Patient not taking: Reported on 01/12/2020 08/19/19   Laverna Peace, MD  Multiple  Vitamin (MULTIVITAMIN WITH MINERALS) TABS tablet Take 1 tablet by mouth daily. Patient not taking: Reported on 01/12/2020 05/20/19   Glade Lloyd, MD  pantoprazole (PROTONIX) 40 MG tablet Take 1 tablet (40 mg total) by mouth 2 (two) times daily. Patient not taking: Reported on 01/12/2020 08/18/19   Roberto Scales D, MD  potassium chloride SA (KLOR-CON) 10 MEQ tablet Take 1 tablet (10 mEq total) by mouth daily. Patient not taking: Reported on 01/12/2020 08/22/19   [name], NP    Allergies    Patient has no known allergies.  Review of Systems   Review of Systems  Constitutional: Positive for activity change, appetite change, chills and fatigue. Negative for fever.  HENT: Negative for congestion and rhinorrhea.   Eyes: Negative for visual disturbance.  Respiratory: Negative for cough, chest tightness and shortness of breath.   Cardiovascular: Negative for chest pain.  Gastrointestinal: Positive for nausea and vomiting.  Genitourinary: Negative for dysuria and hematuria.  Musculoskeletal: Positive for  arthralgias, joint swelling and myalgias.  Skin: Negative for wound.  Neurological: Positive for dizziness, weakness and light-headedness.    all other systems are negative except as noted in the HPI and PMH.   Physical Exam Updated Vital Signs BP (!) 143/109   Pulse 98   Temp 99 F (37.2 C) (Oral)   Resp 16   Ht 6\' 6"  (1.981 m)   Wt 97.5 kg   SpO2 100%   BMI 24.85 kg/m   Physical Exam Vitals and nursing note reviewed.  Constitutional:      General: He is not in acute distress.    Appearance: He is well-developed. He is ill-appearing.     Comments: Mildly tremulous Chronically ill-appearing  HENT:     Head: Normocephalic and atraumatic.     Mouth/Throat:     Mouth: Mucous membranes are dry.     Pharynx: No oropharyngeal exudate.  Eyes:     Conjunctiva/sclera: Conjunctivae normal.     Pupils: Pupils are equal, round, and reactive to light.  Neck:     Comments:  No meningismus. Cardiovascular:     Rate and Rhythm: Normal rate and regular rhythm.     Heart sounds: Normal heart sounds. No murmur heard.   Pulmonary:     Effort: Pulmonary effort is normal. No respiratory distress.     Breath sounds: Normal breath sounds.  Abdominal:     Palpations: Abdomen is soft.     Tenderness: There is no abdominal tenderness. There is no guarding or rebound.  Musculoskeletal:        General: Swelling and deformity present. No tenderness. Normal range of motion.     Cervical back: Normal range of motion and neck supple.     Comments: Large joint effusion of the knees bilaterally.  Left is warmer than the right.  There is no surrounding erythema.  Reduced range of motion on the left.  Skin:    General: Skin is warm.     Capillary Refill: Capillary refill takes less than 2 seconds.  Neurological:     Mental Status: He is alert.     Cranial Nerves: No cranial nerve deficit.     Motor: No abnormal muscle tone.     Coordination: Coordination normal.     Comments: 5/5 strength throughout, cranial nerves II to XII intact, alert and oriented x3  Psychiatric:        Behavior: Behavior normal.     ED Results / Procedures / Treatments   Labs (all labs ordered are listed, but only abnormal results are displayed) Labs Reviewed  COMPREHENSIVE METABOLIC PANEL - Abnormal; Notable for the following components:      Result Value   Sodium 129 (*)    Potassium 2.6 (*)    Chloride 90 (*)    Glucose, Bld 127 (*)    Calcium 7.5 (*)    Albumin 2.9 (*)    AST 87 (*)    ALT 45 (*)    Anion gap 17 (*)    All other components within normal limits  SALICYLATE LEVEL - Abnormal; Notable for the following components:   Salicylate Lvl <7.0 (*)    All other components within normal limits  ACETAMINOPHEN LEVEL - Abnormal; Notable for the following components:   Acetaminophen (Tylenol), Serum <10 (*)    All other components within normal limits  CBC - Abnormal; Notable for  the following components:   RBC 3.46 (*)    Hemoglobin 11.0 (*)  HCT 30.8 (*)    All other components within normal limits  LACTIC ACID, PLASMA - Abnormal; Notable for the following components:   Lactic Acid, Venous 2.1 (*)    All other components within normal limits  SYNOVIAL CELL COUNT + DIFF, W/ CRYSTALS - Abnormal; Notable for the following components:   Color, Synovial RED (*)    Appearance-Synovial TURBID (*)    WBC, Synovial 7,182 (*)    Neutrophil, Synovial 89 (*)    Monocyte-Macrophage-Synovial Fluid 9 (*)    All other components within normal limits  MAGNESIUM - Abnormal; Notable for the following components:   Magnesium 1.0 (*)    All other components within normal limits  BLOOD GAS, ARTERIAL - Abnormal; Notable for the following components:   pH, Arterial 7.483 (*)    pO2, Arterial 144 (*)    All other components within normal limits  BODY FLUID CULTURE  GRAM STAIN  CULTURE, BLOOD (ROUTINE X 2)  CULTURE, BLOOD (ROUTINE X 2)  SARS CORONAVIRUS 2 BY RT PCR (HOSPITAL ORDER, [facility])  ETHANOL  CK  LACTIC ACID, PLASMA  AMMONIA  TSH  RAPID URINE DRUG SCREEN, HOSP PERFORMED  URINALYSIS, ROUTINE W REFLEX MICROSCOPIC  GLUCOSE, BODY FLUID OTHER  PROTEIN, BODY FLUID (OTHER)    EKG EKG Interpretation  Date/Time:  Tuesday April 17 2020 01:58:47 EDT Ventricular Rate:  90 PR Interval:    QRS Duration: 94 QT Interval:  398 QTC Calculation: 487 R Axis:   53 Text Interpretation: Sinus rhythm Ventricular premature complex Probable left atrial enlargement Borderline T abnormalities, anterior leads Borderline prolonged QT interval No significant change was found Nonspecific T wave abnormality Confirmed by Ezequiel Essex [phone]) on 04/17/2020 2:24:35 AM   Radiology DG Chest 2 View  Result Date: 04/17/2020 CLINICAL DATA:  Knee pain EXAM: CHEST - 2 VIEW COMPARISON:  01/12/2020 FINDINGS: The heart size and mediastinal contours are  within normal limits. Both lungs are clear. The visualized skeletal structures are unremarkable. IMPRESSION: Normal study. Electronically Signed   By: Rolm Baptise M.D.   On: 04/17/2020 01:01   CT Head Wo Contrast  Result Date: 04/17/2020 CLINICAL DATA:  Altered mental status EXAM: CT HEAD WITHOUT CONTRAST TECHNIQUE: Contiguous axial images were obtained from the base of the skull through the vertex without intravenous contrast. COMPARISON:  04/11/2020 FINDINGS: Brain: No acute intracranial abnormality. Specifically, no hemorrhage, hydrocephalus, mass lesion, acute infarction, or significant intracranial injury. Mild age advanced volume loss. Vascular: No hyperdense vessel or unexpected calcification. Skull: No acute calvarial abnormality. Sinuses/Orbits: Visualized paranasal sinuses and mastoids clear. Orbital soft tissues unremarkable. Other: None IMPRESSION: No acute intracranial abnormality. Electronically Signed   By: Rolm Baptise M.D.   On: 04/17/2020 01:18   DG Knee Complete 4 Views Left  Result Date: 04/17/2020 CLINICAL DATA:  Knee pain EXAM: LEFT KNEE - COMPLETE 4+ VIEW COMPARISON:  11/10/2018 FINDINGS: Moderate tricompartment degenerative changes within the left knee with joint space narrowing and spurring. Large joint effusion. No acute fracture, subluxation or dislocation. Old healed proximal fibular shaft fracture. 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 IMPRESSION: No acute bony abnormality. Moderate tricompartment degenerative changes. Large joint effusion. Electronically Signed   By: Rolm Baptise M.D.   On: 04/17/2020 01:01    Procedures .Joint Aspiration/Arthrocentesis  Date/Time: 04/17/2020 1:41 AM Performed by: Ezequiel Essex, MD Authorized by: Ezequiel Essex, MD   Consent:    Consent obtained:  Verbal  Consent given by:  Patient   Risks discussed:  Incomplete drainage, bleeding, infection, nerve damage,  poor cosmetic result and pain   Alternatives discussed:  No treatment Location:    Location:  Knee   Knee:  L knee Anesthesia (see MAR for exact dosages):    Anesthesia method:  Local infiltration   Local anesthetic:  Lidocaine 2% WITH epi Procedure details:    Preparation: Patient was prepped and draped in usual sterile fashion     Needle gauge:  18 G   Ultrasound guidance: no     Approach:  Medial   Aspirate amount:  50   Aspirate characteristics:  Purulent and blood-tinged   Steroid injected: no     Specimen collected: yes   Post-procedure details:    Dressing:  Adhesive bandage   Patient tolerance of procedure:  Tolerated well, no immediate complications  .Critical Care Performed by: Glynn Octave, MD Authorized by: Glynn Octave, MD   Critical care provider statement:    Critical care time (minutes):  90   Critical care was necessary to treat or prevent imminent or life-threatening deterioration of the following conditions: altered mental status, septic arthritis, alcohol withdrawal.   Critical care was time spent personally by me on the following activities:  Discussions with consultants, evaluation of patient's response to treatment, examination of patient, ordering and performing treatments and interventions, ordering and review of laboratory studies, ordering and review of radiographic studies, pulse oximetry, re-evaluation of patient's condition, obtaining history from patient or surrogate and review of old charts   (including critical care time)  Medications Ordered in ED Medications  sodium chloride 0.9 % bolus 1,000 mL (has no administration in time range)  sodium chloride 0.9 % bolus 1,000 mL (has no administration in time range)  lidocaine-EPINEPHrine (XYLOCAINE W/EPI) 2 %-1:200000 (PF) injection 20 mL (has no administration in time range)  potassium chloride 10 mEq in 100 mL IVPB (has no administration in time range)  LORazepam (ATIVAN) tablet 1-4 mg (has  no administration in time range)    Or  LORazepam (ATIVAN) injection 1-4 mg (has no administration in time range)  thiamine tablet 100 mg (has no administration in time range)    Or  thiamine (B-1) injection 100 mg (has no administration in time range)  folic acid (FOLVITE) tablet 1 mg (has no administration in time range)  multivitamin with minerals tablet 1 tablet (has no administration in time range)  LORazepam (ATIVAN) injection 0-4 mg (has no administration in time range)    Followed by  LORazepam (ATIVAN) injection 0-4 mg (has no administration in time range)    ED Course  I have reviewed the triage vital signs and the nursing notes.  Pertinent labs & imaging results that were available during my care of the patient were reviewed by me and considered in my medical decision making (see chart for details).    MDM Rules/Calculators/A&P                         Patient here with dehydration and concern for not drinking enough fluids.  Has had nausea and vomiting and decreased urine output.  Also reports hallucinations since taking girlfriends gabapentin 2 days ago.   Mildly tremulous.  No alcohol use for the past 7 days.  Will start on CIWA protocol.  Previous Visit reviewed.  Patient had CT scan as well as MRI to rule out infarct.  Patient having nausea and dry heaving on arrival.  Emesis is nonbloody.  Did have EGD in October that showed extensive esophagitis.  He is mildly tremulous.  Patient hydrated.  CK and creatinine are mildly elevated.  Hypokalemia was replaced.  Patient has a large joint effusions of his bilateral knees and elbows.  He is agreeable to arthrocentesis of left knee which is worse in terms of swelling and warmth.  Temperature 99 on arrival.  Patient with change in mental status at 3 AM. This was after he received ativan for CIWA protocol.  He is apparently responding to internal stimuli having tardive dyskinesia like movements of his mouth, following commands  intermittently and not carrying on a conversation like he was before. Flailing around extremities and not cooperating.  Rectal temperature will be obtained.  Patient started on broad-spectrum antibiotics. He is given Benadryl as well as Ativan for CIWA protocol.  Rectal temp 100.3. Broad spectrum antibiotics started after cultures sent.  Patient remains altered and agitated.  He is writhing around the bed having dystonic movements of his face and flailing his extremities.  He will not respond to commands but mumbles a few words.  He is Protecting his airway and is combative when approached.  No effect with Ativan, Haldol or Benadryl.  Question the etiology of this behavior.  It appeared onset after getting Ativan. ABG shows no CO2 retention.  Ammonia added.  CK is normal  We will attempt Precedex drip.  Synovial fluid findings Discussed with Dr. Eulah Pont of orthopedic surgery.  They will consult on patient later today.  Mental status improving on Precedex drip.  Suspect alcohol withdrawal as source of patient's altered mental status rather than infection or intoxication.  Suspect this is also the source of his hallucinations rather than gabapentin as the patient thought.  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  Discussed with Dr. Desmond Lope.   Admission to critical care team discussed with Dr. Arsenio Loader. Patient continues to protecting his airway.  He has no meningismus. He is calm on Precedex drip and is receiving Ativan for his suspected alcohol withdrawal.  Final Clinical Impression(s) / ED Diagnoses Final diagnoses:  Acute encephalopathy  Pyogenic arthritis of left knee joint, due to unspecified organism [facility])  Delirium tremens [facility])    Rx / DC Orders ED Discharge Orders    None       Elka Satterfield, Jeannett Senior, MD 04/17/20 [phone]

## 2020-04-17 NOTE — ED Notes (Signed)
Ativan not given with CIWA due to ? Ativan reaction last time given

## 2020-04-17 NOTE — Progress Notes (Signed)
Venous duplex       has been completed. Preliminary results can be found under CV proc through chart review. Azlan Hanway, BS, RDMS, RVT   

## 2020-04-17 NOTE — Consult Note (Signed)
ORTHOPAEDIC CONSULTATION  REQUESTING PHYSICIAN: Chesley Mires, MD  Chief Complaint: Knee pain  HPI: Richard Lopez is a 44 y.o. male with a history of alcohol abuse who presented to the emergency department complaining of dehydration and hallucinations.  Last alcohol 04/12/2020.  Knee pain worse on the left.  X-ray shows severe arthritis.  Left knee aspiration showed cell count 7K cells.  Initial Gram stain noted gram-positive cocci.  Orthopedics was consulted for evaluation.  However, Dr. Marchia Bond directly called the laboratory who has overread the slides, and indicated NO GRAM POSITIVE COCCI, and there was some clumped dye which could have been misread.   The patient was admitted for delirium tremens, aspiration pneumonitis, hyponatremia hypokalemia, hypomagnesemia.  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   Past Medical History:  Diagnosis Date   Anxiety    Arthritis    hands and possibly knee   Depression    Diabetes mellitus    diet controlled   Essential hypertension    Gastritis    Gout    Normocytic anemia due to blood loss 08/21/2019   Pulmonary embolism [facility])    Past Surgical History:  Procedure Laterality Date   ESOPHAGOGASTRODUODENOSCOPY (EGD) WITH PROPOFOL N/A 08/14/2019   Procedure: ESOPHAGOGASTRODUODENOSCOPY (EGD) WITH PROPOFOL;  Surgeon: Wilford Corner, MD;  [facility];  Service: Endoscopy;  Laterality: N/A;   EXTERNAL FIXATION LEG Left 11/07/2018   Procedure: EXTERNAL FIXATION LEFT LOWER LEG;  Surgeon: Rod Can, MD;  Location: [facility];  Service: Orthopedics;  Laterality: Left;   EXTERNAL FIXATION REMOVAL Left 11/08/2018   Procedure: REMOVAL EXTERNAL FIXATION LEG;  Surgeon: Shona Needles, MD;  Location: [place];  Service: Orthopedics;  Laterality: Left;   INTRAMEDULLARY (IM) NAIL INTERTROCHANTERIC Right 03/26/2019   Procedure: INTRAMEDULLARY (IM) NAIL INTERTROCHANTRIC;  Surgeon: Shona Needles, MD;  Location: [place];  Service:  Orthopedics;  Laterality: Right;   INTRAMEDULLARY (IM) NAIL INTERTROCHANTERIC Right 05/17/2019   Procedure: Intramedullary (Im) Nail Intertroch with circlage wiring;  Surgeon: Altamese South , MD;  Location: [place];  Service: Orthopedics;  Laterality: Right;   NO PAST SURGERIES     OPEN REDUCTION INTERNAL FIXATION (ORIF) TIBIA/FIBULA FRACTURE Left 11/08/2018   Procedure: OPEN REDUCTION INTERNAL FIXATION (ORIF) TIBIA/FIBULA FRACTURE;  Surgeon: Shona Needles, MD;  Location: [place];  Service: Orthopedics;  Laterality: Left;   ORIF FEMUR FRACTURE Right 05/17/2019   Procedure: REMOVAL  OF HARDWARE;  Surgeon: Altamese Montgomery, MD;  Location: [place];  Service: Orthopedics;  Laterality: Right;   Social History   Socioeconomic History   Marital status: Single    Spouse name: Not on file   Number of children: Not on file   Years of education: Not on file   Highest education level: Some college, no degree  Occupational History   Not on file  Tobacco Use   Smoking status: Former Smoker    Packs/day: 0.00    Types: Cigarettes   Smokeless tobacco: Never Used   Tobacco comment: About 1 cigarette per day or less  Vaping Use   Vaping Use: Never used  Substance and Sexual Activity   Alcohol use: Yes    Alcohol/week: 200.0 standard drinks    Types: 200 Cans of beer per week   Drug use: Yes    Types: Marijuana   Sexual activity: Yes  Other Topics Concern   Not on file  Social History Narrative   Not on file   Social Determinants of Health   Financial  Resource Strain:    Difficulty of Paying Living Expenses:   Food Insecurity:    Worried About Programme researcher, broadcasting/film/video in the Last Year:    Barista in the Last Year:   Transportation Needs:    Freight forwarder (Medical):    Lack of Transportation (Non-Medical):   Physical Activity:    Days of Exercise per Week:    Minutes of Exercise per Session:   Stress:    Feeling of Stress :   Social Connections:     Frequency of Communication with Friends and Family:    Frequency of Social Gatherings with Friends and Family:    Attends Religious Services:    Active Member of Clubs or Organizations:    Attends Engineer, structural:    Marital Status:    Family History  Problem Relation Age of Onset   Diabetes Mellitus II Father    Diabetes Mellitus II Other    CAD Other    No Known Allergies Prior to Admission medications   Not on File   DG Chest 2 View  Result Date: 04/17/2020 CLINICAL DATA:  Knee pain EXAM: CHEST - 2 VIEW COMPARISON:  01/12/2020 FINDINGS: The heart size and mediastinal contours are within normal limits. Both lungs are clear. The visualized skeletal structures are unremarkable. IMPRESSION: Normal study. Electronically Signed   By: Charlett Nose M.D.   On: 04/17/2020 01:01   CT Head Wo Contrast  Result Date: 04/17/2020 CLINICAL DATA:  Altered mental status EXAM: CT HEAD WITHOUT CONTRAST TECHNIQUE: Contiguous axial images were obtained from the base of the skull through the vertex without intravenous contrast. COMPARISON:  04/11/2020 FINDINGS: Brain: No acute intracranial abnormality. Specifically, no hemorrhage, hydrocephalus, mass lesion, acute infarction, or significant intracranial injury. Mild age advanced volume loss. Vascular: No hyperdense vessel or unexpected calcification. Skull: No acute calvarial abnormality. Sinuses/Orbits: Visualized paranasal sinuses and mastoids clear. Orbital soft tissues unremarkable. Other: None IMPRESSION: No acute intracranial abnormality. Electronically Signed   By: Charlett Nose M.D.   On: 04/17/2020 01:18   CT Angio Chest PE W and/or Wo Contrast  Result Date: 04/17/2020 CLINICAL DATA:  Dehydrated and auditory hallucinations EXAM: CT ANGIOGRAPHY CHEST WITH CONTRAST TECHNIQUE: Multidetector CT imaging of the chest was performed using the standard protocol during bolus administration of intravenous contrast. Multiplanar CT image  reconstructions and MIPs were obtained to evaluate the vascular anatomy. CONTRAST:  OMNIPAQUE IOHEXOL 350 MG/ML SOLN COMPARISON:  Multiple prior exams, most recently January 12, 2020 FINDINGS: Cardiovascular: There is slightly suboptimal opacification of the main pulmonary artery. No central or segmental pulmonary embolism is seen. There is probable residual chronic partial occlusive thrombus seen in the posterior right lower lobe subsegmental pulmonary artery, series 8, image 168. This is in the same subsegmental branch dating back to July 24, 2019 there is mild cardiomegaly. No pericardial effusion or thickening. No evidence right heart strain. There is normal three-vessel brachiocephalic anatomy without proximal stenosis. Coronary artery calcifications are seen. Mediastinum/Nodes: No hilar, mediastinal, or axillary adenopathy. Thyroid gland, trachea, and esophagus demonstrate no significant findings. Lungs/Pleura: Mildly hazy patchy airspace opacity seen at the posterior right lung base. No large airspace consolidation or pleural effusion. No pleural effusion or pneumothorax. No airspace consolidation. Upper Abdomen: No acute abnormalities present in the visualized portions of the upper abdomen. Musculoskeletal: No chest wall abnormality. No acute or significant osseous findings. Review of the MIP images confirms the above findings. IMPRESSION: Slightly suboptimal opacification of the main  pulmonary artery. 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 These results were called by telephone at the time of interpretation on 04/17/2020 at 6:37 am to provider [facility] , who verbally acknowledged these results. Electronically Signed   By: Prudencio Pair M.D.    On: 04/17/2020 06:33   DG Knee Complete 4 Views Left  Result Date: 04/17/2020 CLINICAL DATA:  Knee pain EXAM: LEFT KNEE - COMPLETE 4+ VIEW COMPARISON:  11/10/2018 FINDINGS: Moderate tricompartment degenerative changes within the left knee with joint space narrowing and spurring. Large joint effusion. No acute fracture, subluxation or dislocation. Old healed proximal fibular shaft fracture. 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 IMPRESSION: No acute bony abnormality. Moderate tricompartment degenerative changes. Large joint effusion. Electronically Signed   By: Rolm Baptise M.D.   On: 04/17/2020 01:01   VAS [place] LOWER EXTREMITY VENOUS (DVT)  Result Date: 04/17/2020  Lower Venous DVTStudy Indications: Edema.  Limitations: Patient cooperation. Comparison Study: 08/01/19 DVT rt gastroc, Pop, PTV Performing Technologist: June Leap RDMS, RVT  Examination Guidelines: A complete evaluation includes B-mode imaging, spectral Doppler, color Doppler, and power Doppler as needed of all accessible portions of each vessel. Bilateral testing is considered an integral part of a complete examination. Limited examinations for reoccurring indications may be performed as noted. The reflux portion of the exam is performed with the patient in reverse Trendelenburg.  +---------+---------------+---------+-----------+----------+--------------+  RIGHT     Compressibility Phasicity Spontaneity Properties Thrombus Aging  +---------+---------------+---------+-----------+----------+--------------+  CFV       Full            Yes       Yes                                    +---------+---------------+---------+-----------+----------+--------------+  SFJ       Full                                                             +---------+---------------+---------+-----------+----------+--------------+  FV Prox   Full                                                              +---------+---------------+---------+-----------+----------+--------------+  FV Mid    Full                                                             +---------+---------------+---------+-----------+----------+--------------+  FV Distal Full                                                             +---------+---------------+---------+-----------+----------+--------------+  PFV       Full                                                             +---------+---------------+---------+-----------+----------+--------------+  POP       Full            Yes       Yes                                    +---------+---------------+---------+-----------+----------+--------------+  PTV       Full                                                             +---------+---------------+---------+-----------+----------+--------------+  PERO      Full                                                             +---------+---------------+---------+-----------+----------+--------------+   +---------+---------------+---------+-----------+----------+--------------+  LEFT      Compressibility Phasicity Spontaneity Properties Thrombus Aging  +---------+---------------+---------+-----------+----------+--------------+  CFV       Full            Yes       Yes                                    +---------+---------------+---------+-----------+----------+--------------+  SFJ       Full                                                             +---------+---------------+---------+-----------+----------+--------------+  FV Prox   Full                                                             +---------+---------------+---------+-----------+----------+--------------+  FV Mid    Full                                                             +---------+---------------+---------+-----------+----------+--------------+  FV Distal Full                                                              +---------+---------------+---------+-----------+----------+--------------+  PFV       Full                                                             +---------+---------------+---------+-----------+----------+--------------+  POP       Full            Yes       Yes                                    +---------+---------------+---------+-----------+----------+--------------+  PTV       Full                                                             +---------+---------------+---------+-----------+----------+--------------+  PERO      Full                                                             +---------+---------------+---------+-----------+----------+--------------+     Summary: BILATERAL: - No evidence of deep vein thrombosis seen in the lower extremities, bilaterally. -No evidence of popliteal cyst, bilaterally.   *See table(s) above for measurements and observations.    Preliminary     Positive ROS: All other systems have been reviewed and were otherwise negative with the exception of those mentioned in the HPI and as above.  Objective: Labs cbc Recent Labs    04/16/20 2315 04/17/20 0810  WBC 5.9 5.2  HGB 11.0* 9.9*  HCT 30.8* 28.5*  PLT 308 272    Labs inflam No results for input(s): CRP in the last 72 hours.  Invalid input(s): ESR  Labs coag No results for input(s): INR, PTT in the last 72 hours.  Invalid input(s): PT  Recent Labs    04/16/20 2315 04/17/20 0810  NA 129* 133*  K 2.6* 2.6*  CL 90* 98  CO2 22 22  GLUCOSE 127* 125*  BUN 12 12  CREATININE 0.72 0.61  CALCIUM 7.5* 6.9*    Physical Exam: Vitals:   04/17/20 1200 04/17/20 1300  BP: 131/82 126/80  Pulse: 61 63  Resp: 20 16  Temp: (!) 93.9 F (34.4 C)   SpO2: 99% (!) 89%   General: Supine in bed.  Somnolent.  Intermittently responds appropriately to questions. Skin: Warm and dry.   MUSCULOSKELETAL:  Bilateral knees resting flexed at about 45 degrees.  Significant bilateral knee effusions  without lesion, erythema, or outward sign of infection.   Other extremities are atraumatic with painless ROM and NVI.  Assessment / Plan: Active Problems:   Delirium tremens (HCC)   Knee pain Left knee aspiration showed cell count 7K cells.   Initial Gram stain noted gram-positive cocci  However, Dr. Marchia Bond directly called the laboratory who has overread the slides, and indicated NO GRAM POSITIVE COCCI, and there was some clumped dye which could have been misread.  Knee does not appear to be infected.  Recommend canceling surgery pending more definitive infectious data/culture results.  Medical management of the patient's DTs top priority at this point.  He still has significant bilateral effusions.  Patient may benefit from aspiration for symptomatic benefit at some point.  Orthopedics will continue to follow cultures at a distance.  Please with questions or new culture results concerning for infection.  Contact information:  Edmonia Lynch MD, Roxan Hockey PA-C  Prudencio Burly III PA-C 04/17/2020 1:53 PM

## 2020-04-17 NOTE — ED Notes (Signed)
Dr Darrold Span our spoke with family member Alessander Sikorski and gave update a short while ago

## 2020-04-17 NOTE — ED Notes (Addendum)
Per CT, pt unable to hold still for CTA. MD provider notified for additional meds to calm the pt.

## 2020-04-17 NOTE — ED Notes (Signed)
[id number] Pt extremely agitated, Dr Manus Gunning in to evaluate pt, additional orders placed and completed. Pt remained agitated and will not cooperate with staff. Covid swab delayed due to pt combative when trying to obtain. Continue to monitor.

## 2020-04-17 NOTE — ED Notes (Signed)
Pt is unable to stand for orthostatic vitals 

## 2020-04-17 NOTE — ED Notes (Signed)
Pt has calmed down and is resting at present, presently at CT. Dr Manus Gunning updated and request we continue to leave Precedex as presently infusing with rate

## 2020-04-17 NOTE — Progress Notes (Signed)
eLink Physician-Brief Progress Note Patient Name: TYNAN BOESEL DOB: [birth year] MRN: [id number]   Date of Service  04/17/2020  HPI/Events of Note  Hypokalemia - K+ = 3.0 and Creatinine = 0.58.  eICU Interventions  Will replace K+.     Intervention Category Major Interventions: Electrolyte abnormality - evaluation and management  Hakeen Shipes [place] 04/17/2020, 9:22 PM

## 2020-04-17 NOTE — Progress Notes (Signed)
Pharmacy Antibiotic Note  Richard Lopez is a 44 y.o. male admitted on 04/17/2020 with septic arthritis .  Pharmacy has been consulted for vancomycin dosing.  Plan:  Vancomycin 2000 mg IV X1 given ED, then 1000 mg IV q8h   Ceftriaxone 2 gr IV q24h  Monitor clinical course, renal function, cultures as available   Height: 6\' 6"  (198.1 cm) Weight: 97.5 kg (215 lb) IBW/kg (Calculated) : 91.4  Temp (24hrs), Avg:99.7 F (37.6 C), Min:99 F (37.2 C), Max:100.3 F (37.9 C)  Recent Labs  Lab 04/16/20 2315 04/17/20 0230 04/17/20 0416 04/17/20 0810  WBC 5.9  --   --  5.2  CREATININE 0.72  --   --  0.61  LATICACIDVEN  --  2.1* 1.4  --     Estimated Creatinine Clearance: 155.5 mL/min (by C-G formula based on SCr of 0.61 mg/dL).    No Known Allergies  Antimicrobials this admission: 6/15 Zosyn x 1  6/15 vancomycin >>  6/15 ceftriaxone >>   Dose adjustments this admission:   Microbiology results: 6/15 BCx:  6/15 synovial knee culture:  rare GPC  6/9 UCx: NGF   Thank you for allowing pharmacy to be a part of this patient's care.  8/9, PharmD, BCPS 04/17/2020 9:02 AM

## 2020-04-17 NOTE — H&P (Signed)
NAME:  Richard Lopez, MRN:  [id number], DOB:  Mar 15, 1976, LOS: 0 ADMISSION DATE:  04/17/2020, CONSULTATION DATE:  04/17/2020 REFERRING MD:  Dr. Manus Gunning, ER, CHIEF COMPLAINT:  Altered mental status   Brief History   44 yo male with hx of alcohol abuse presented to the ER c/o of being dehydrated and hallucinating because he took his girlfriend's gabapentin.  Had not had any alcohol since 04/12/20.  Also had pain and swelling in his Lt knee, and found to have positive Gram stain of Lt knee arthrocentesis.  Mental status progressively worse in ER and started on precedex for delirium tremens.  Pt not able to provide hx.    Past Medical History  DM, HTN, Gout, PE/DVT 08/01/19 not on meds due to cost, Erosive esophagitis, Aspiration pneumonia  Significant Hospital Events   6/15 Admit, start precedex  Consults:  Ortho  Procedures:    Significant Diagnostic Tests:  Lt Knee xray 6/15 >> large joint effusion CT head 6/15 >> no acute findings CT angio chest 6/15 >> residual chronic partially occlusive thrombus in posterior RLL, atherosclerosis, hazy opacities in posterior lungs  Micro Data:  SARS CoV2 6/15 >> Blood 6/15 >> Synovial fluid Lt knee 6/15 >> GPC >>   Antimicrobials:  Vancomycin 6/15 >> Rocephin 6/15 >>   Interim history/subjective:    Objective   Blood pressure 139/83, pulse 64, temperature 100.3 F (37.9 C), resp. rate (!) 22, height 6\' 6"  (1.981 m), weight 97.5 kg, SpO2 99 %.        Intake/Output Summary (Last 24 hours) at 04/17/2020 0800 Last data filed at 04/17/2020 04/19/2020 Gross per 24 hour  Intake 1189.3 ml  Output --  Net 1189.3 ml   Filed Weights   04/16/20 2122  Weight: 97.5 kg    Examination:  General - sedated Eyes - pupils reactive ENT - no sinus tenderness, no stridor, wears partial dentures Cardiac - regular rate/rhythm, no murmur Chest - equal breath sounds b/l, no wheezing or rales Abdomen - soft, non tender, + bowel sounds Extremities - Lt knee  warm, swollen Skin - no rashes Neuro - RASS -2  Resolved Hospital Problem list     Assessment & Plan:   Delirium tremens. - continue precedex for RASS goal 0 to -1 - thiamine, folic acid, MVI - dextrose in IV fluids  Septic arthritis Lt knee. - vancomycin, rocephin pending culture results - orthopedics consulted in ER  Aspiration pneumonitis. - head of bed elevated - aspiration precautions - continue ABx - f/u CXR  Hyponatremia, hypokalemia, hypomagnesemia. - NS IV fluid at 75 ml/hr - f/u BMET, electrolytes and replace as needed  Hx of DVT/PE from September 2020. 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 - protonix  Hx of HTN. - prn hydralazine for SBP > 160, DBP > 105  Best practice:  Diet: clear liquids DVT prophylaxis: lovenox GI prophylaxis: protonix Mobility: bed rest Code Status: full code Disposition: ICU  Labs   CBC: Recent Labs  Lab 04/16/20 2315  WBC 5.9  HGB 11.0*  HCT 30.8*  MCV 89.0  PLT 308    Basic Metabolic Panel: Recent Labs  Lab 04/16/20 2315 04/17/20 0230  NA 129*  --   K 2.6*  --   CL 90*  --   CO2 22  --   GLUCOSE 127*  --  BUN 12  --   CREATININE 0.72  --   CALCIUM 7.5*  --   MG  --  1.0*   GFR: Estimated Creatinine Clearance: 155.5 mL/min (by C-G formula based on SCr of 0.72 mg/dL). Recent Labs  Lab 04/16/20 2315 04/17/20 0230 04/17/20 0416  WBC 5.9  --   --   LATICACIDVEN  --  2.1* 1.4    Liver Function Tests: Recent Labs  Lab 04/16/20 2315  AST 87*  ALT 45*  ALKPHOS 60  BILITOT 1.1  PROT 7.4  ALBUMIN 2.9*   No results for input(s): LIPASE, AMYLASE in the last 168 hours. Recent Labs  Lab 04/17/20 0416  AMMONIA 29    ABG    Component Value Date/Time   PHART 7.483 (H) 04/17/2020 0413   PCO2ART 32.4 04/17/2020 0413   PO2ART 144 (H) 04/17/2020 0413    HCO3 24.1 04/17/2020 0413   TCO2 29 08/24/2010 1201   O2SAT 98.6 04/17/2020 0413     Coagulation Profile: No results for input(s): INR, PROTIME in the last 168 hours.  Cardiac Enzymes: Recent Labs  Lab 04/16/20 2315  CKTOTAL 328    HbA1C: Hgb A1c MFr Bld  Date/Time Value Ref Range Status  04/01/2019 11:23 AM 5.4 4.8 - 5.6 % Final    Comment:    (NOTE) Pre diabetes:          5.7%-6.4% Diabetes:              >6.4% Glycemic control for   <7.0% adults with diabetes   03/26/2019 03:03 AM 5.8 (H) 4.8 - 5.6 % Final    Comment:    (NOTE) Pre diabetes:          5.7%-6.4% Diabetes:              >6.4% Glycemic control for   <7.0% adults with diabetes     CBG: Recent Labs  Lab 04/10/20 2224  GLUCAP 164*    Review of Systems:   Unable to obtain  Past Medical History  He,  has a past medical history of Anxiety, Arthritis, Depression, Diabetes mellitus, Essential hypertension, Gastritis, Gout, Normocytic anemia due to blood loss (08/21/2019), and Pulmonary embolism (HCC).   Surgical History    Past Surgical History:  Procedure Laterality Date  . ESOPHAGOGASTRODUODENOSCOPY (EGD) WITH PROPOFOL N/A 08/14/2019   Procedure: ESOPHAGOGASTRODUODENOSCOPY (EGD) WITH PROPOFOL;  Surgeon: Charlott Rakes, MD;  [facility];  Service: Endoscopy;  Laterality: N/A;  . EXTERNAL FIXATION LEG Left 11/07/2018   Procedure: EXTERNAL FIXATION LEFT LOWER LEG;  Surgeon: Samson Frederic, MD;  [facility];  Service: Orthopedics;  Laterality: Left;  . EXTERNAL FIXATION REMOVAL Left 11/08/2018   Procedure: REMOVAL EXTERNAL FIXATION LEG;  Surgeon: Roby Lofts, MD;  [facility];  Service: Orthopedics;  Laterality: Left;  . INTRAMEDULLARY (IM) NAIL INTERTROCHANTERIC Right 03/26/2019   Procedure: INTRAMEDULLARY (IM) NAIL INTERTROCHANTRIC;  Surgeon: Roby Lofts, MD;  Location: [facility];  Service: Orthopedics;  Laterality: Right;  . INTRAMEDULLARY (IM) NAIL INTERTROCHANTERIC Right  05/17/2019   Procedure: Intramedullary (Im) Nail Intertroch with circlage wiring;  Surgeon: Myrene Galas, MD;  Location: [facility];  Service: Orthopedics;  Laterality: Right;  . NO PAST SURGERIES    . OPEN REDUCTION INTERNAL FIXATION (ORIF) TIBIA/FIBULA FRACTURE Left 11/08/2018   Procedure: OPEN REDUCTION INTERNAL FIXATION (ORIF) TIBIA/FIBULA FRACTURE;  Surgeon: Roby Lofts, MD;  Location[facility];  Service: Orthopedics;  Laterality: Left;  . ORIF FEMUR FRACTURE Right 05/17/2019  Procedure: REMOVAL  OF HARDWARE;  Surgeon: Altamese Newcastle, MD;  Location: [place];  Service: Orthopedics;  Laterality: Right;     Social History   reports that he has quit smoking. His smoking use included cigarettes. He smoked 0.00 packs per day. He has never used smokeless tobacco. He reports current alcohol use of about 200.0 standard drinks of alcohol per week. He reports current drug use. Drug: Marijuana.   Family History   His family history includes CAD in an other family member; Diabetes Mellitus II in his father and another family member.   Allergies No Known Allergies   Home Medications  Prior to Admission medications   Not on File     Critical care time: 39 minutes  Chesley Mires, MD [place] Pager - [phone] 04/17/2020, 8:18 AM

## 2020-04-17 NOTE — Progress Notes (Signed)
A consult was received from an ED physician for Vancomycin and Zosyn per pharmacy dosing.  The patient's profile has been reviewed for ht/wt/allergies/indication/available labs.   A one time order has been placed for Vancomycin 2gm iv x1, and Zosyn 3.375gm iv x1.  Further antibiotics/pharmacy consults should be ordered by admitting physician if indicated.                       Thank you, Aleene Davidson Crowford 04/17/2020  3:45 AM

## 2020-04-18 LAB — CBC
HCT: 26.3 % — ABNORMAL LOW (ref 39.0–52.0)
Hemoglobin: 9.2 g/dL — ABNORMAL LOW (ref 13.0–17.0)
MCH: 31.4 pg (ref 26.0–34.0)
MCHC: 35 g/dL (ref 30.0–36.0)
MCV: 89.8 fL (ref 80.0–100.0)
Platelets: 312 10*3/uL (ref 150–400)
RBC: 2.93 MIL/uL — ABNORMAL LOW (ref 4.22–5.81)
RDW: 14.3 % (ref 11.5–15.5)
WBC: 4.7 10*3/uL (ref 4.0–10.5)
nRBC: 0 % (ref 0.0–0.2)

## 2020-04-18 LAB — BLOOD CULTURE ID PANEL (REFLEXED)

## 2020-04-18 LAB — BASIC METABOLIC PANEL
Anion gap: 8 (ref 5–15)
Anion gap: 9 (ref 5–15)
BUN: 8 mg/dL (ref 6–20)
BUN: 6 mg/dL (ref 6–20)
CO2: 23 mmol/L (ref 22–32)
CO2: 24 mmol/L (ref 22–32)
Calcium: 7.4 mg/dL — ABNORMAL LOW (ref 8.9–10.3)
Calcium: 7.9 mg/dL — ABNORMAL LOW (ref 8.9–10.3)
Chloride: 100 mmol/L (ref 98–111)
Chloride: 99 mmol/L (ref 98–111)
Creatinine, Ser: 0.54 mg/dL — ABNORMAL LOW (ref 0.61–1.24)
Creatinine, Ser: 0.56 mg/dL — ABNORMAL LOW (ref 0.61–1.24)
GFR calc Af Amer: >60 mL/min (ref >60)
GFR calc Af Amer: >60 mL/min (ref >60)
GFR calc non Af Amer: >60 mL/min (ref >60)
GFR calc non Af Amer: >60 mL/min (ref >60)
Glucose, Bld: 160 mg/dL — ABNORMAL HIGH (ref 70–99)
Glucose, Bld: 156 mg/dL — ABNORMAL HIGH (ref 70–99)
Potassium: 3.2 mmol/L — ABNORMAL LOW (ref 3.5–5.1)
Potassium: 3.6 mmol/L (ref 3.5–5.1)
Sodium: 131 mmol/L — ABNORMAL LOW (ref 135–145)
Sodium: 132 mmol/L — ABNORMAL LOW (ref 135–145)

## 2020-04-18 LAB — GLUCOSE, BODY FLUID OTHER: Glucose, Body Fluid Other: 38 mg/dL

## 2020-04-18 LAB — GLUCOSE, CAPILLARY
Glucose-Capillary: 132 mg/dL — ABNORMAL HIGH (ref 70–99)
Glucose-Capillary: 163 mg/dL — ABNORMAL HIGH (ref 70–99)
Glucose-Capillary: 148 mg/dL — ABNORMAL HIGH (ref 70–99)
Glucose-Capillary: 210 mg/dL — ABNORMAL HIGH (ref 70–99)

## 2020-04-18 LAB — PROTEIN, BODY FLUID (OTHER): Total Protein, Body Fluid Other: 3.6 g/dL

## 2020-04-18 LAB — URIC ACID: Uric Acid, Serum: 5 mg/dL (ref 3.7–8.6)

## 2020-04-18 MED ORDER — INSULIN ASPART 100 UNIT/ML ~~LOC~~ SOLN
0.0000 [IU] | Freq: Every day | SUBCUTANEOUS | Status: DC
  Administered 2020-04-18: 2 [IU] via SUBCUTANEOUS

## 2020-04-18 MED ORDER — LACTATED RINGERS IV SOLN: INTRAVENOUS | Status: DC

## 2020-04-18 MED ORDER — POTASSIUM CHLORIDE 10 MEQ/100ML IV SOLN
10.0000 meq | INTRAVENOUS | Status: AC
  Administered 2020-04-18 (×4): 10 meq via INTRAVENOUS
  Filled 2020-04-18 (×4): qty 100

## 2020-04-18 MED ORDER — METHYLPREDNISOLONE ACETATE 80 MG/ML IJ SUSP
80.0000 mg | Freq: Once | INTRAMUSCULAR | Status: DC
  Filled 2020-04-18: qty 1

## 2020-04-18 MED ORDER — SODIUM CHLORIDE 0.9% FLUSH: 10.0000 mL | INTRAVENOUS | Status: DC | PRN

## 2020-04-18 MED ORDER — BUPIVACAINE HCL 0.5 % IJ SOLN
10.0000 mL | Freq: Once | INTRAMUSCULAR | Status: DC
  Filled 2020-04-18: qty 10

## 2020-04-18 MED ORDER — CHLORHEXIDINE GLUCONATE CLOTH 2 % EX PADS
6.0000 | MEDICATED_PAD | Freq: Every day | CUTANEOUS | Status: DC
  Administered 2020-04-19 – 2020-04-22 (×4): 6 via TOPICAL

## 2020-04-18 MED ORDER — POTASSIUM CHLORIDE CRYS ER 20 MEQ PO TBCR
20.0000 meq | EXTENDED_RELEASE_TABLET | ORAL | Status: AC
  Administered 2020-04-18 (×2): 20 meq via ORAL
  Filled 2020-04-18 (×2): qty 1

## 2020-04-18 MED ORDER — INSULIN ASPART 100 UNIT/ML ~~LOC~~ SOLN
0.0000 [IU] | Freq: Three times a day (TID) | SUBCUTANEOUS | Status: DC
  Administered 2020-04-18: 2 [IU] via SUBCUTANEOUS
  Administered 2020-04-18 – 2020-04-19 (×2): 3 [IU] via SUBCUTANEOUS
  Administered 2020-04-19 (×2): 5 [IU] via SUBCUTANEOUS
  Administered 2020-04-20 (×2): 3 [IU] via SUBCUTANEOUS
  Administered 2020-04-21 – 2020-04-22 (×3): 2 [IU] via SUBCUTANEOUS

## 2020-04-18 MED ORDER — SODIUM CHLORIDE 0.9% FLUSH
10.0000 mL | Freq: Two times a day (BID) | INTRAVENOUS | Status: DC
  Administered 2020-04-18 – 2020-04-23 (×9): 10 mL

## 2020-04-18 NOTE — Progress Notes (Signed)
RN paged MD for precedex heart rate guidelines. Per Craige Cotta MD, patient heart rate is okay to remain in 50s as long as blood pressure is maintaining. Will continue to monitor

## 2020-04-18 NOTE — Progress Notes (Signed)
PHARMACY - PHYSICIAN COMMUNICATION CRITICAL VALUE ALERT - BLOOD CULTURE IDENTIFICATION (BCID)  Richard Lopez is an 44 y.o. male who presented to [facility] on 04/17/2020 with a chief complaint of Hallucinations, dehydration  Assessment:  Patient in ICU already on broad spectrum antibiotics.  1 out of 4 culture results.  (include suspected source if known)  Name of physician (or Provider) Contacted: Dr. Arsenio Loader  Current antibiotics: Ceftriaxone and vancomycin  Changes to prescribed antibiotics recommended:  Patient is on recommended antibiotics - No changes needed  Results for orders placed or performed during the hospital encounter of 04/17/20  Blood Culture ID Panel (Reflexed) (Collected: 04/17/2020  2:30 AM)  Result Value Ref Range   Enterococcus species NOT DETECTED NOT DETECTED   Listeria monocytogenes NOT DETECTED NOT DETECTED   Staphylococcus species DETECTED (A) NOT DETECTED   Staphylococcus aureus (BCID) NOT DETECTED NOT DETECTED   Methicillin resistance DETECTED (A) NOT DETECTED   Streptococcus species NOT DETECTED NOT DETECTED   Streptococcus agalactiae NOT DETECTED NOT DETECTED   Streptococcus pneumoniae NOT DETECTED NOT DETECTED   Streptococcus pyogenes NOT DETECTED NOT DETECTED   Acinetobacter baumannii NOT DETECTED NOT DETECTED   Enterobacteriaceae species NOT DETECTED NOT DETECTED   Enterobacter cloacae complex NOT DETECTED NOT DETECTED   Escherichia coli NOT DETECTED NOT DETECTED   Klebsiella oxytoca NOT DETECTED NOT DETECTED   Klebsiella pneumoniae NOT DETECTED NOT DETECTED   Proteus species NOT DETECTED NOT DETECTED   Serratia marcescens NOT DETECTED NOT DETECTED   Haemophilus influenzae NOT DETECTED NOT DETECTED   Neisseria meningitidis NOT DETECTED NOT DETECTED   Pseudomonas aeruginosa NOT DETECTED NOT DETECTED   Candida albicans NOT DETECTED NOT DETECTED   Candida glabrata NOT DETECTED NOT DETECTED   Candida krusei NOT DETECTED NOT DETECTED   Candida  parapsilosis NOT DETECTED NOT DETECTED   Candida tropicalis NOT DETECTED NOT DETECTED    Richard Lopez 04/18/2020  6:32 AM

## 2020-04-18 NOTE — Progress Notes (Signed)
NAME:  Richard Lopez, MRN:  [id number], DOB:  11/11/1975, LOS: 1 ADMISSION DATE:  04/17/2020, CONSULTATION DATE:  04/17/2020 REFERRING MD:  Dr. Manus Gunning, ER, CHIEF COMPLAINT:  Altered mental status   Brief History   44 yo male with hx of alcohol abuse presented to the ER c/o of being dehydrated and hallucinating because he took his girlfriend's gabapentin.  Had not had any alcohol since 04/12/20.  Also had pain and swelling in his Lt knee, and found to have positive Gram stain of Lt knee arthrocentesis.  Mental status progressively worse in ER and started on precedex for delirium tremens.  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  Remains on precedex.  Feels hungry.  Knees hurt Lt > Rt.  Objective   Blood pressure 114/78, pulse (!) 56, temperature 98.4 F (36.9 C), temperature source Axillary, resp. rate 17, height 6\' 6"  (1.981 m), weight 97.5 kg, SpO2 93 %.        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 - precedex for RASS goal 0 to -1 - prn ativan for CIWA > 8 - thiamine, folic acid, MVI  Lt knee swelling with hx of gout. - continue ABx until synovial fluid culture resulted - ortho to repeat aspiration 6/16  Aspiration pneumonitis. - continue ABx - f/u CXR   Hypokalemia, hypomagnesemia. - f/u BMET, Mg  Hx of DVT/PE from September 2020. 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 - protonix  Anemia of critical illness and chronic disease. - f/u CBC - check iron levels - transfuse for Hb < 7 or significant bleeding  DM type II poorly controlled hyperglycemia. - SSI  Hx of HTN. - prn hydralazine for SBP > 160, DBP > 105  Best practice:  Diet: carb modified DVT prophylaxis: lovenox GI prophylaxis: protonix Mobility: bed rest Code Status: full code Disposition: ICU  Labs    CMP  Latest Ref Rng & Units 04/18/2020 04/17/2020 04/17/2020  Glucose 70 - 99 mg/dL 160(H) 195(H) 125(H)  BUN 6 - 20 mg/dL 8 10 12   Creatinine 0.61 - 1.24 mg/dL 0.54(L) 0.58(L) 0.61  Sodium 135 - 145 mmol/L 131(L) 134(L) 133(L)  Potassium 3.5 - 5.1 mmol/L 3.2(L) 3.0(L) 2.6(LL)  Chloride 98 - 111 mmol/L 100 101 98  CO2 22 - 32 mmol/L 23 24 22   Calcium 8.9 - 10.3 mg/dL 7.4(L) 7.2(L) 6.9(L)  Total Protein 6.5 - 8.1 g/dL - - 6.2(L)  Total Bilirubin 0.3 - 1.2 mg/dL - - 1.2  Alkaline Phos 38 - 126 U/L - - 51  AST 15 - 41 U/L - - 62(H)  ALT 0 - 44 U/L - - 37     CBC Latest Ref Rng & Units 04/18/2020 04/17/2020 04/16/2020  WBC 4.0 - 10.5 K/uL 4.7 5.2 5.9  Hemoglobin 13.0 - 17.0 g/dL 9.2(L) 9.9(L) 11.0(L)  Hematocrit 39 - 52 % 26.3(L) 28.5(L) 30.8(L)  Platelets 150 - 400 K/uL 312 272 308    CBG (last 3)  Recent Labs    04/18/20 0743  GLUCAP 132*    Signature:  Chesley Mires, MD [place] Pager - [phone] 04/18/2020, 8:43 AM

## 2020-04-18 NOTE — Procedures (Signed)
Procedure: Bilateral knee aspiration and injection  Indication: Bilateral knee effusion(s)  Surgeon: Charma Igo, PA-C  Assist: None  Anesthesia: Topical refrigerant  EBL: None  Complications: None  Findings: After risks/benefits explained patient desires to undergo procedure. Consent obtained and time out performed. Bilateral knees were sterilely prepped and aspirated. reddish clear fluid obtained from the left, 24ml clear yellow fluid obtained from the right. Both knees were instilled with 36ml 0.5% Marcaine and 80mg  depomedrol each. Pt tolerated the procedure well.    , PA-C Orthopedic Surgery [phone]

## 2020-04-18 NOTE — Progress Notes (Signed)
eLink Physician-Brief Progress Note Patient Name: Richard Lopez DOB: [birth year] MRN: [id number]   Date of Service  04/18/2020  HPI/Events of Note  Agitation - Request for bilateral soft wrist restraints.   eICU Interventions  Will order bilateral soft wrist restraints.      Intervention Category Major Interventions: Delirium, psychosis, severe agitation - evaluation and management  Corneilus Heggie Eugene 04/18/2020, 11:51 PM

## 2020-04-18 NOTE — Progress Notes (Signed)
Patient removing oxygen monitor- RN attempted to reorient and educate patient on keeping monitor on. When able to check O2 it is 100%. No signs of respiratory distress or tachypnea at this time.

## 2020-04-18 NOTE — TOC Progression Note (Signed)
Transition of Care [facility]) - Progression Note    Patient Details  Name: ISAIAS DOWSON MRN: [id number] Date of Birth: November 15, 1975  [facility] of [facility]) CM/SW Contact  Golda Acre, RN Phone Number: 04/18/2020, 8:39 AM  Clinical Narrative:    etoh w/d and delirium.  Bilateral knee effusion rt greater than left hx of gout.  Asp. Of knee =GPC/ From home and has pcp, plan is to return home,.  Will need substance and etoh abuse resources once mentation clears.   Expected Discharge Plan: Home/Self Care Barriers to Discharge: Continued Medical Work up  Expected Discharge Plan and Services Expected Discharge Plan: Home/Self Care   Discharge Planning Services: CM Consult   Living arrangements for the past 2 months: Single Family Home                                       Social Determinants of Health (SDOH) Interventions    Readmission Risk Interventions Readmission Risk Prevention Plan 05/19/2019  Transportation Screening Complete  Medication Review [employer]) Complete  PCP or Specialist appointment within 3-5 days of discharge Complete  HRI or Home Care Consult Complete  SW Recovery Care/Counseling Consult Complete  Palliative Care Screening Not Applicable  Skilled Nursing Facility Not Applicable  Some recent data might be hidden

## 2020-04-18 NOTE — Progress Notes (Signed)
    Subjective: Patient reports pain mild to moderate bilateral knees and ankles.  Left knee is more sore.  Denies injury.  He has had flares of gout in the past that present like this.  Objective:   VITALS:   Vitals:   04/18/20 0400 04/18/20 0409 04/18/20 0500 04/18/20 0600  BP: 125/80  (!) 138/96 114/78  Pulse: 63  (!) 58 (!) 56  Resp: (!) 22  17 17   Temp:  98.4 F (36.9 C)    TempSrc:  Axillary    SpO2: 100%  100% 93%  Weight:      Height:       CBC Latest Ref Rng & Units 04/18/2020 04/17/2020 04/16/2020  WBC 4.0 - 10.5 K/uL 4.7 5.2 5.9  Hemoglobin 13.0 - 17.0 g/dL 04/18/2020) 6.6(Y) 11.0(L)  Hematocrit 39 - 52 % 26.3(L) 28.5(L) 30.8(L)  Platelets 150 - 400 K/uL 312 272 308   BMP Latest Ref Rng & Units 04/18/2020 04/17/2020 04/17/2020  Glucose 70 - 99 mg/dL 04/19/2020) 474(Q) 595(G)  BUN 6 - 20 mg/dL 8 10 12   Creatinine 0.61 - 1.24 mg/dL 387(F) ) 6.43(P  BUN/Creat Ratio 9 - 20 - - -  Sodium 135 - 145 mmol/L 131(L) 134(L) 133(L)  Potassium 3.5 - 5.1 mmol/L 3.2(L) 3.0(L) 2.6(LL)  Chloride 98 - 111 mmol/L 100 101 98  CO2 22 - 32 mmol/L 23 24 22   Calcium 8.9 - 10.3 mg/dL 7.4(L) 7.2(L) 6.9(L)   Intake/Output      06/15 0701 - 06/16 0700 06/16 0701 - 06/17 0700   P.O. 600    I.V. (mL/kg) 1743.5 (17.9)    IV Piggyback 2229.5    Total Intake(mL/kg) 4573 (46.9)    Urine (mL/kg/hr) 1400 (0.6)    Total Output 1400    Net +3173         Urine Occurrence 3 x       Physical Exam General: NAD.  Upright in bed.  Calm and conversant.  MSK Bilateral knees with large effusion.  No outward sign of infection and no lesions.  Right knee range of motion 0 to 100 degrees.  Left knee 5 to 45 degrees (left knee limited to about 85 to 90 degrees of flexion at baseline per patient).  Bilateral ankles with moderate effusion.  No signs of infection.  EHL, FHL, dorsiflexion, plantarflexion intact.  Sensation intact distally.  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    Left knee aspiration showed cell count 7K cells.  Culture NGTD Initial Gram stain noted gram-positive cocci  However, Dr. 7/16 directly called the laboratory who has overread the slides, and indicated NO GRAM POSITIVE COCCI, and there was some clumped dye which could have been misread.  Knee does not appear to be infected.     Discussed bilateral knee aspiration with the patient. He would like this. Plan for bilateral aspiration w/ possible injection later today or tomorrow mainly for symptomatic relief.    7/16 III, PA-C 04/18/2020, 8:13 AM

## 2020-04-19 LAB — BASIC METABOLIC PANEL
Anion gap: 11 (ref 5–15)
BUN: 9 mg/dL (ref 6–20)
CO2: 21 mmol/L — ABNORMAL LOW (ref 22–32)
Calcium: 8 mg/dL — ABNORMAL LOW (ref 8.9–10.3)
Chloride: 100 mmol/L (ref 98–111)
Creatinine, Ser: 0.56 mg/dL — ABNORMAL LOW (ref 0.61–1.24)
GFR calc Af Amer: >60 mL/min (ref >60)
GFR calc non Af Amer: >60 mL/min (ref >60)
Glucose, Bld: 213 mg/dL — ABNORMAL HIGH (ref 70–99)
Potassium: 3.7 mmol/L (ref 3.5–5.1)
Sodium: 132 mmol/L — ABNORMAL LOW (ref 135–145)

## 2020-04-19 LAB — CBC
HCT: 29.3 % — ABNORMAL LOW (ref 39.0–52.0)
Hemoglobin: 10 g/dL — ABNORMAL LOW (ref 13.0–17.0)
MCH: 31.3 pg (ref 26.0–34.0)
MCHC: 34.1 g/dL (ref 30.0–36.0)
MCV: 91.6 fL (ref 80.0–100.0)
Platelets: 402 10*3/uL — ABNORMAL HIGH (ref 150–400)
RBC: 3.2 MIL/uL — ABNORMAL LOW (ref 4.22–5.81)
RDW: 14.4 % (ref 11.5–15.5)
WBC: 5 10*3/uL (ref 4.0–10.5)
nRBC: 0 % (ref 0.0–0.2)

## 2020-04-19 LAB — GLUCOSE, CAPILLARY
Glucose-Capillary: 193 mg/dL — ABNORMAL HIGH (ref 70–99)
Glucose-Capillary: 234 mg/dL — ABNORMAL HIGH (ref 70–99)
Glucose-Capillary: 208 mg/dL — ABNORMAL HIGH (ref 70–99)
Glucose-Capillary: 163 mg/dL — ABNORMAL HIGH (ref 70–99)

## 2020-04-19 LAB — MAGNESIUM: Magnesium: 1.5 mg/dL — ABNORMAL LOW (ref 1.7–2.4)

## 2020-04-19 MED ORDER — MAGNESIUM SULFATE 2 GM/50ML IV SOLN
2.0000 g | Freq: Once | INTRAVENOUS | Status: AC
  Administered 2020-04-19: 2 g via INTRAVENOUS
  Filled 2020-04-19: qty 50

## 2020-04-19 MED ORDER — DULOXETINE HCL 20 MG PO CPEP
20.0000 mg | ORAL_CAPSULE | Freq: Every day | ORAL | Status: DC
  Administered 2020-04-19 – 2020-04-23 (×5): 20 mg via ORAL
  Filled 2020-04-19 (×5): qty 1

## 2020-04-19 MED ORDER — POTASSIUM CHLORIDE 10 MEQ/100ML IV SOLN
10.0000 meq | INTRAVENOUS | Status: AC
  Administered 2020-04-19 (×3): 10 meq via INTRAVENOUS
  Filled 2020-04-19 (×2): qty 100

## 2020-04-19 MED ORDER — QUETIAPINE FUMARATE 50 MG PO TABS
50.0000 mg | ORAL_TABLET | Freq: Two times a day (BID) | ORAL | Status: DC
  Administered 2020-04-20 – 2020-04-23 (×8): 50 mg via ORAL
  Filled 2020-04-19 (×8): qty 1

## 2020-04-19 MED ORDER — DIAZEPAM 2 MG PO TABS
2.0000 mg | ORAL_TABLET | Freq: Three times a day (TID) | ORAL | Status: DC
  Administered 2020-04-19 – 2020-04-23 (×12): 2 mg via ORAL
  Filled 2020-04-19 (×13): qty 1

## 2020-04-19 MED ORDER — POTASSIUM CHLORIDE 10 MEQ/100ML IV SOLN
INTRAVENOUS | Status: AC
  Filled 2020-04-19: qty 100

## 2020-04-19 NOTE — Progress Notes (Signed)
eLink Physician-Brief Progress Note Patient Name: ORVAL DORTCH DOB: 1976-09-30 MRN: [id number]   Date of Service  04/19/2020  HPI/Events of Note  Nursing concern about AFIB - Ventricular response = 90. However, this may represent PAC's.   eICU Interventions  Plan: 1. 12 Lead EKG STAT. 2. Please send AM labs now.      Intervention Category Major Interventions: Arrhythmia - evaluation and management  Kambry Takacs Eugene 04/19/2020, 2:01 AM

## 2020-04-19 NOTE — Progress Notes (Signed)
NAME:  Richard Lopez, MRN:  [id number], DOB:  07-02-1976, LOS: 2 ADMISSION DATE:  04/17/2020, CONSULTATION DATE:  04/17/2020 REFERRING MD:  Dr. Manus Gunning, ER, CHIEF COMPLAINT:  Altered mental status   Brief History   44 yo male with hx of alcohol abuse presented to the ER c/o of being dehydrated and hallucinating because he took his girlfriend's gabapentin.  Had not had any alcohol since 04/12/20.  Also had pain and swelling in his Lt knee, and found to have positive Gram stain of Lt knee arthrocentesis.  Mental status progressively worse in ER and started on precedex for delirium tremens.  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  Now in restraints and increased precedex.  Objective   Blood pressure (!) 136/104, pulse 70, temperature 98.6 F (37 C), temperature source Axillary, resp. rate (!) 28, height 6\' 6"  (1.981 m), weight 97.5 kg, SpO2 90 %.        Intake/Output Summary (Last 24 hours) at 04/19/2020 0743 Last data filed at 04/19/2020 0700 Gross per 24 hour  Intake 1149.8 ml  Output --  Net 1149.8 ml   Filed Weights   04/16/20 2122  Weight: 97.5 kg    Examination:  General -  sedated Eyes - pupils reactive ENT - snoring Cardiac - regular rate/rhythm, no murmur Chest - equal breath sounds b/l, no wheezing or rales Abdomen - soft, non tender, + bowel sounds Extremities - decreased swelling b/l knees Skin - no rashes Neuro - RASS -3   Resolved Hospital Problem list   Hyponatremia  Assessment & Plan:   Delirium tremens. - precedex for RASS goal 0 to -1 - add seroquel, scheduled valium, cymbalta - thiamine, folic acid, MVI  Lt knee swelling with hx of gout. - s/p b/l knee injections - ortho following  Aspiration pneumonitis. - day 3/5 of ABx - f/u CXR   Hypokalemia, hypomagnesemia. - replace electrolytes  Hx of DVT/PE from September 2020. 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 - protonix  Anemia of critical illness and chronic disease. - f/u CBC - check iron levels - transfuse for Hb < 7 or significant bleeding  DM type II poorly controlled hyperglycemia. - SSI  Hx of HTN. - prn hydralazine for SBP > 160, DBP > 105  Best practice:  Diet: carb modified DVT prophylaxis: lovenox GI prophylaxis: protonix Mobility: bed rest Code Status: full code Disposition: ICU  Labs    CMP Latest Ref Rng & Units 04/19/2020 04/18/2020 04/18/2020  Glucose  70 - 99 mg/dL 213(H) 156(H) 160(H)  BUN 6 - 20 mg/dL 9 6 8   Creatinine 0.61 - 1.24 mg/dL 0.56(L) 0.56(L) 0.54(L)  Sodium 135 - 145 mmol/L 132(L) 132(L) 131(L)  Potassium 3.5 - 5.1 mmol/L 3.7 3.6 3.2(L)  Chloride 98 - 111 mmol/L 100 99 100  CO2 22 - 32 mmol/L 21(L) 24 23  Calcium 8.9 - 10.3 mg/dL 8.0(L) 7.9(L) 7.4(L)  Total Protein 6.5 - 8.1 g/dL - - -  Total Bilirubin 0.3 - 1.2 mg/dL - - -  Alkaline Phos 38 - 126 U/L - - -  AST 15 - 41 U/L - - -  ALT 0 - 44 U/L - - -    CBC Latest Ref Rng & Units  04/19/2020 04/18/2020 04/17/2020  WBC 4.0 - 10.5 K/uL 5.0 4.7 5.2  Hemoglobin 13.0 - 17.0 g/dL 10.0(L) 9.2(L) 9.9(L)  Hematocrit 39 - 52 % 29.3(L) 26.3(L) 28.5(L)  Platelets 150 - 400 K/uL 402(H) 312 272    CBG (last 3)  Recent Labs    04/18/20 1204 04/18/20 1749 04/18/20 2152  GLUCAP 163* 148* 210*    Signature:  Chesley Mires, MD [place] Pager - [phone] 04/19/2020, 7:43 AM

## 2020-04-19 NOTE — Progress Notes (Signed)
eLink Physician-Brief Progress Note Patient Name: KREED KAUFFMAN DOB: [birth year] MRN: [id number]   Date of Service  04/19/2020  HPI/Events of Note  Agitation   eICU Interventions  Plan: 1. Increase ceiling on Precedex IV infusion to 1.0 mcg/kg/hour.      Intervention Category Major Interventions: Delirium, psychosis, severe agitation - evaluation and management  Montez Cuda Eugene 04/19/2020, 1:34 AM

## 2020-04-20 ENCOUNTER — Inpatient Hospital Stay (HOSPITAL_COMMUNITY): Payer: Self-pay

## 2020-04-20 LAB — CBC
HCT: 30 % — ABNORMAL LOW (ref 39.0–52.0)
Hemoglobin: 10.2 g/dL — ABNORMAL LOW (ref 13.0–17.0)
MCH: 30.8 pg (ref 26.0–34.0)
MCHC: 34 g/dL (ref 30.0–36.0)
MCV: 90.6 fL (ref 80.0–100.0)
Platelets: 518 10*3/uL — ABNORMAL HIGH (ref 150–400)
RBC: 3.31 MIL/uL — ABNORMAL LOW (ref 4.22–5.81)
RDW: 14.7 % (ref 11.5–15.5)
WBC: 6.2 10*3/uL (ref 4.0–10.5)
nRBC: 0 % (ref 0.0–0.2)

## 2020-04-20 LAB — BODY FLUID CULTURE: Culture: NO GROWTH

## 2020-04-20 LAB — GLUCOSE, CAPILLARY
Glucose-Capillary: 152 mg/dL — ABNORMAL HIGH (ref 70–99)
Glucose-Capillary: 155 mg/dL — ABNORMAL HIGH (ref 70–99)
Glucose-Capillary: 78 mg/dL (ref 70–99)
Glucose-Capillary: 104 mg/dL — ABNORMAL HIGH (ref 70–99)

## 2020-04-20 LAB — BASIC METABOLIC PANEL
Anion gap: 6 (ref 5–15)
BUN: 11 mg/dL (ref 6–20)
CO2: 22 mmol/L (ref 22–32)
Calcium: 7.9 mg/dL — ABNORMAL LOW (ref 8.9–10.3)
Chloride: 109 mmol/L (ref 98–111)
Creatinine, Ser: 0.56 mg/dL — ABNORMAL LOW (ref 0.61–1.24)
GFR calc Af Amer: >60 mL/min (ref >60)
GFR calc non Af Amer: >60 mL/min (ref >60)
Glucose, Bld: 157 mg/dL — ABNORMAL HIGH (ref 70–99)
Potassium: 4 mmol/L (ref 3.5–5.1)
Sodium: 137 mmol/L (ref 135–145)

## 2020-04-20 LAB — IRON AND TIBC
Iron: 26 ug/dL — ABNORMAL LOW (ref 45–182)
Saturation Ratios: 22 % (ref 17.9–39.5)
TIBC: 118 ug/dL — ABNORMAL LOW (ref 250–450)
UIBC: 92 ug/dL

## 2020-04-20 LAB — CULTURE, BLOOD (ROUTINE X 2): Special Requests: ADEQUATE

## 2020-04-20 LAB — MAGNESIUM: Magnesium: 1.6 mg/dL — ABNORMAL LOW (ref 1.7–2.4)

## 2020-04-20 LAB — FERRITIN: Ferritin: 455 ng/mL — ABNORMAL HIGH (ref 24–336)

## 2020-04-20 MED ORDER — ONDANSETRON HCL 4 MG/2ML IJ SOLN
4.0000 mg | Freq: Three times a day (TID) | INTRAMUSCULAR | Status: DC | PRN
  Administered 2020-04-20: 4 mg via INTRAVENOUS
  Filled 2020-04-20 (×2): qty 2

## 2020-04-20 MED ORDER — METOPROLOL TARTRATE 25 MG PO TABS
50.0000 mg | ORAL_TABLET | Freq: Two times a day (BID) | ORAL | Status: DC
  Administered 2020-04-20 (×2): 50 mg via ORAL
  Filled 2020-04-20 (×3): qty 2

## 2020-04-20 MED ORDER — PROMETHAZINE HCL 25 MG/ML IJ SOLN
12.5000 mg | Freq: Four times a day (QID) | INTRAMUSCULAR | Status: DC | PRN
  Administered 2020-04-20: 12.5 mg via INTRAVENOUS

## 2020-04-20 MED ORDER — SUCRALFATE 1 G PO TABS
1.0000 g | ORAL_TABLET | Freq: Three times a day (TID) | ORAL | Status: DC
  Administered 2020-04-21 – 2020-04-23 (×11): 1 g via ORAL
  Filled 2020-04-20 (×11): qty 1

## 2020-04-20 MED ORDER — MAGNESIUM SULFATE 2 GM/50ML IV SOLN
2.0000 g | Freq: Once | INTRAVENOUS | Status: AC
  Administered 2020-04-20: 2 g via INTRAVENOUS
  Filled 2020-04-20: qty 50

## 2020-04-20 MED ORDER — METOCLOPRAMIDE HCL 5 MG/ML IJ SOLN
10.0000 mg | Freq: Four times a day (QID) | INTRAMUSCULAR | Status: DC | PRN
  Administered 2020-04-21: 10 mg via INTRAVENOUS
  Filled 2020-04-20: qty 2

## 2020-04-20 MED ORDER — ENOXAPARIN SODIUM 100 MG/ML ~~LOC~~ SOLN
100.0000 mg | Freq: Two times a day (BID) | SUBCUTANEOUS | Status: DC
  Administered 2020-04-20 – 2020-04-23 (×6): 100 mg via SUBCUTANEOUS
  Filled 2020-04-20 (×7): qty 1

## 2020-04-20 MED ORDER — METOPROLOL TARTRATE 5 MG/5ML IV SOLN
2.5000 mg | INTRAVENOUS | Status: DC | PRN
  Administered 2020-04-20 – 2020-04-21 (×4): 5 mg via INTRAVENOUS
  Filled 2020-04-20 (×4): qty 5

## 2020-04-20 MED ORDER — OXYCODONE HCL 5 MG PO TABS
5.0000 mg | ORAL_TABLET | Freq: Four times a day (QID) | ORAL | Status: DC | PRN
  Administered 2020-04-20 – 2020-04-22 (×3): 5 mg via ORAL
  Filled 2020-04-20 (×3): qty 1

## 2020-04-20 MED ORDER — MORPHINE SULFATE (PF) 2 MG/ML IV SOLN
1.0000 mg | INTRAVENOUS | Status: DC | PRN
  Administered 2020-04-21 (×2): 2 mg via INTRAVENOUS
  Filled 2020-04-20 (×2): qty 1

## 2020-04-20 NOTE — Progress Notes (Signed)
ANTICOAGULATION CONSULT NOTE - Initial Consult  Pharmacy Consult for Full Dose Lovenox Indication: atrial fibrillation  No Known Allergies  Patient Measurements: Height: 6\' 6"  (198.1 cm) Weight: 97.5 kg (215 lb) IBW/kg (Calculated) : 91.4  Vital Signs: Temp: 97.6 F (36.4 C) (06/18 1200) Temp Source: Oral (06/18 1200) BP: 108/74 (06/18 1400) Pulse Rate: 97 (06/18 1400)  Labs: Recent Labs    04/18/20 0222 04/18/20 0222 04/18/20 1256 04/19/20 0215 04/20/20 0554  HGB 9.2*   < >  --  10.0* 10.2*  HCT 26.3*  --   --  29.3* 30.0*  PLT 312  --   --  402* 518*  CREATININE 0.54*   < > 0.56* 0.56* 0.56*   < > = values in this interval not displayed.    Estimated Creatinine Clearance: 155.5 mL/min (A) (by C-G formula based on SCr of 0.56 mg/dL (L)).   Medical History: Past Medical History:  Diagnosis Date  . Anxiety   . 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 Depression   . Diabetes mellitus    diet controlled  . Essential hypertension   . Gastritis   . Gout   . Normocytic anemia due to blood loss 08/21/2019  . Pulmonary embolism (HCC)     Medications:  Scheduled:  . chlorhexidine  15 mL Mouth Rinse BID  . Chlorhexidine Gluconate Cloth  6 each Topical Q0600  . diazepam  2 mg Oral Q8H  . DULoxetine  20 mg Oral Daily  . enoxaparin (LOVENOX) injection  40 mg Subcutaneous Q24H  . folic acid  1 mg Oral Daily  . insulin aspart  0-15 Units Subcutaneous TID WC  . insulin aspart  0-5 Units Subcutaneous QHS  . mouth rinse  15 mL Mouth Rinse q12n4p  . multivitamin with minerals  1 tablet Oral Daily  . pantoprazole  40 mg Oral Daily  . QUEtiapine  50 mg Oral BID  . sodium chloride flush  10-40 mL Intracatheter Q12H  . thiamine  100 mg Oral Daily   Infusions:  . cefTRIAXone (ROCEPHIN)  IV Stopped (04/20/20 0950)  . lactated ringers 50 mL/hr at 04/20/20 1400    Assessment: 44 yo male presented to ED with CC dehydrated and hallucinating, hx Etoh abuse to start full  dose Lovenox for Afib. Patient received Lovenox 40mg  at 0800 this AM. No reported bleeding  Goal of Therapy:  Heparin level 0.6-1.1 units/ml Monitor platelets by anticoagulation protocol: Yes   Plan:  Lovenox 1mg /kg SQ q12 Daily CBC  45 04/20/2020,2:39 PM

## 2020-04-20 NOTE — Progress Notes (Addendum)
NAME:  Richard Lopez, MRN:  [id number], DOB:  1976-05-26, LOS: 3 ADMISSION DATE:  04/17/2020, CONSULTATION DATE:  04/17/2020 REFERRING MD:  Dr. Wyvonnia Dusky, ER, CHIEF COMPLAINT:  Altered mental status   Brief History   44 yo male with hx of alcohol abuse presented to the ER c/o of being dehydrated and hallucinating because he took his girlfriend's gabapentin.  Had not had any alcohol since 04/12/20.  Also had pain and swelling in his Lt knee, and found to have positive Gram stain of Lt knee arthrocentesis.  Mental status progressively worse in ER and started on precedex for delirium tremens.  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 rate (!) 23, height 6\' 6"  (1.981 m), weight 97.5 kg, SpO2 98 %.        Intake/Output Summary (Last 24 hours) at 04/20/2020 0847 Last data filed at 04/20/2020 0500 Gross per 24 hour  Intake 1773.72 ml  Output 900 ml  Net 873.72 ml   Filed Weights   04/16/20 2122    Weight: 97.5 kg    Examination: General: adult male lying in bed in NAD   HEENT: MM pink/moist Neuro: sleeping, awakens to voice, drifts back to sleep CV: s1s2 RRR, no m/r/g PULM:  Non-labored on RA, lungs bilaterally clear, snoring when sleeping  GI: soft, bsx4 active  Extremities: warm/dry, no edema  Skin: no rashes or lesions   Resolved Hospital Problem list   Hyponatremia  Assessment & Plan:   Delirium tremens. -wean precedex to off  -continue seroquel, valium, cymbalta  -thiamine, folic acid, MVI -may be able to discontinue safety sitter   Lt knee swelling with hx of gout. -s/p bilateral knee injections -appreciate Ortho input  Aspiration pneumonitis. -D4/5 abx -follow intermittent CXR   Hypokalemia, hypomagnesemia. -monitor, replace as indicated  Hx of DVT/PE from September 2020. 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 -PPI   Anemia of critical illness and chronic disease. -trend CBC  -transfuse for Hgb <7% or active bleeding  -folate, thiamine, MVI   DM type II poorly controlled hyperglycemia. -SSI   Hx of HTN. -PRN hydralazine for SBP >160, DBP >105  Best practice:  Diet: carb modified DVT prophylaxis: lovenox GI prophylaxis: protonix Mobility: bed rest Code Status: full code Disposition: ICU.  Transfer to SDU, [place] as of 6/19  Labs  CMP Latest Ref Rng & Units 04/20/2020 04/19/2020 04/18/2020  Glucose 70 - 99 mg/dL 063(E) 685(K) 883(G)  BUN 6 - 20 mg/dL 11 9 6   Creatinine 0.61 - 1.24 mg/dL ) 1.41(P) 9.73(Z)  Sodium 135 - 145 mmol/L 137 132(L) 132(L)  Potassium 3.5 - 5.1 mmol/L 4.0 3.7 3.6  Chloride 98 - 111 mmol/L 109 100 99  CO2 22 - 32 mmol/L 22 21(L) 24  Calcium 8.9 - 10.3 mg/dL 7.9(L) 8.0(L) 7.9(L)  Total Protein 6.5 - 8.1 g/dL - - -  Total Bilirubin 0.3 -  1.2 mg/dL - - -  Alkaline Phos 38 - 126 U/L - - -  AST 15 - 41 U/L - - -  ALT 0 - 44 U/L - - -    CBC Latest Ref Rng & Units 04/20/2020 04/19/2020 04/18/2020  WBC 4.0 - 10.5 K/uL 6.2 5.0 4.7  Hemoglobin 13.0 - 17.0 g/dL 10.2(L) 10.0(L) 9.2(L)  Hematocrit 39 - 52 % 30.0(L) 29.3(L) 26.3(L)  Platelets 150 - 400 K/uL 518(H) 402(H) 312    CBG (last 3)  Recent Labs    04/19/20 1737 04/19/20 2155 04/20/20 0808  GLUCAP 208* 163* 152*    Signature:    04/22/20, MSN, NP-C [facility] 04/20/2020, 8:48 AM   Please see [email or web address] for pager details.

## 2020-04-20 NOTE — Progress Notes (Signed)
eLink Physician-Brief Progress Note Patient Name: Richard Lopez DOB: Jun 17, 1976 MRN: [id number]   Date of Service  04/20/2020  HPI/Events of Note  Patient with nausea unrelieved by Zofran or Phenergan, patient is a diabetic and so may have a component of gastroparesis, he also has a history of recurrent esophagitis which could be exacerbating his nausea.  eICU Interventions  Will discontinue Zofran and Phenergan to reduce the number of potentially QT prolonging medications on his MAR. Reglan 10 mg iv Q 6 hours PRN ordered for nausea and potential gastroparesis, Carafate will be ordered for potential esophagitis contributing to his intractable nausea.        Richard Lopez 04/20/2020, 11:00 PM

## 2020-04-20 NOTE — Progress Notes (Signed)
eLink Physician-Brief Progress Note Patient Name: Richard Lopez DOB: [birth year] MRN: [id number]   Date of Service  04/20/2020  HPI/Events of Note  Patient needs a PRN iv pain medication order for when he is too nauseated to take po Oxycodone.  eICU Interventions  Morphine 1-2 mg iv Q 4 hours PRN pain ordered.        Thomasene Lot [name] 04/20/2020, 9:22 PM

## 2020-04-20 NOTE — Progress Notes (Signed)
Patient noted to be in persistent AF, rates 100-120.  Status post 2.5 mg IV lopressor with rate improvement. Patient had difficulties with affording medications prior to admit and was not taking anticoagulation.  There is a fall risk concern given his ETOH abuse.  He has a chronic small RLL PE, negative LE DVT this admit.     Plan: [facility] consult for medication assistance possibilities Transition to full dose lovenox given AFwRVR    Canary Brim, MSN, NP-C [facility] 04/20/2020, 2:36 PM   Please see [email or web address] for pager details.

## 2020-04-21 DIAGNOSIS — M170 Bilateral primary osteoarthritis of knee: Secondary | ICD-10-CM

## 2020-04-21 DIAGNOSIS — E785 Hyperlipidemia, unspecified: Secondary | ICD-10-CM

## 2020-04-21 DIAGNOSIS — E1169 Type 2 diabetes mellitus with other specified complication: Secondary | ICD-10-CM

## 2020-04-21 DIAGNOSIS — I10 Essential (primary) hypertension: Secondary | ICD-10-CM

## 2020-04-21 DIAGNOSIS — E871 Hypo-osmolality and hyponatremia: Secondary | ICD-10-CM

## 2020-04-21 DIAGNOSIS — J690 Pneumonitis due to inhalation of food and vomit: Secondary | ICD-10-CM

## 2020-04-21 DIAGNOSIS — Z7289 Other problems related to lifestyle: Secondary | ICD-10-CM

## 2020-04-21 LAB — GLUCOSE, CAPILLARY
Glucose-Capillary: 120 mg/dL — ABNORMAL HIGH (ref 70–99)
Glucose-Capillary: 114 mg/dL — ABNORMAL HIGH (ref 70–99)
Glucose-Capillary: 147 mg/dL — ABNORMAL HIGH (ref 70–99)
Glucose-Capillary: 108 mg/dL — ABNORMAL HIGH (ref 70–99)

## 2020-04-21 LAB — COMPREHENSIVE METABOLIC PANEL
ALT: 43 U/L (ref 0–44)
AST: 41 U/L (ref 15–41)
Albumin: 2.6 g/dL — ABNORMAL LOW (ref 3.5–5.0)
Alkaline Phosphatase: 56 U/L (ref 38–126)
Anion gap: 8 (ref 5–15)
BUN: 10 mg/dL (ref 6–20)
CO2: 26 mmol/L (ref 22–32)
Calcium: 8 mg/dL — ABNORMAL LOW (ref 8.9–10.3)
Chloride: 103 mmol/L (ref 98–111)
Creatinine, Ser: 0.58 mg/dL — ABNORMAL LOW (ref 0.61–1.24)
GFR calc Af Amer: >60 mL/min (ref >60)
GFR calc non Af Amer: >60 mL/min (ref >60)
Glucose, Bld: 144 mg/dL — ABNORMAL HIGH (ref 70–99)
Potassium: 3.4 mmol/L — ABNORMAL LOW (ref 3.5–5.1)
Sodium: 137 mmol/L (ref 135–145)
Total Bilirubin: 0.5 mg/dL (ref 0.3–1.2)
Total Protein: 6.3 g/dL — ABNORMAL LOW (ref 6.5–8.1)

## 2020-04-21 LAB — CBC
HCT: 30.1 % — ABNORMAL LOW (ref 39.0–52.0)
Hemoglobin: 10 g/dL — ABNORMAL LOW (ref 13.0–17.0)
MCH: 30.6 pg (ref 26.0–34.0)
MCHC: 33.2 g/dL (ref 30.0–36.0)
MCV: 92 fL (ref 80.0–100.0)
Platelets: 595 10*3/uL — ABNORMAL HIGH (ref 150–400)
RBC: 3.27 MIL/uL — ABNORMAL LOW (ref 4.22–5.81)
RDW: 14.9 % (ref 11.5–15.5)
WBC: 8.4 10*3/uL (ref 4.0–10.5)
nRBC: 0 % (ref 0.0–0.2)

## 2020-04-21 LAB — MAGNESIUM: Magnesium: 1.4 mg/dL — ABNORMAL LOW (ref 1.7–2.4)

## 2020-04-21 MED ORDER — ACETAMINOPHEN 325 MG PO TABS
650.0000 mg | ORAL_TABLET | Freq: Four times a day (QID) | ORAL | Status: DC
  Administered 2020-04-21 – 2020-04-23 (×9): 650 mg via ORAL
  Filled 2020-04-21 (×9): qty 2

## 2020-04-21 MED ORDER — METOPROLOL TARTRATE 50 MG PO TABS
75.0000 mg | ORAL_TABLET | Freq: Two times a day (BID) | ORAL | Status: DC
  Administered 2020-04-21 – 2020-04-23 (×5): 75 mg via ORAL
  Filled 2020-04-21: qty 3
  Filled 2020-04-21: qty 6
  Filled 2020-04-21 (×2): qty 1

## 2020-04-21 MED ORDER — METOPROLOL TARTRATE 5 MG/5ML IV SOLN
5.0000 mg | INTRAVENOUS | Status: DC | PRN
  Filled 2020-04-21: qty 5

## 2020-04-21 MED ORDER — SODIUM CHLORIDE 0.9 % IV SOLN
INTRAVENOUS | Status: DC | PRN
  Administered 2020-04-21: 250 mL via INTRAVENOUS

## 2020-04-21 MED ORDER — POTASSIUM CHLORIDE CRYS ER 20 MEQ PO TBCR
40.0000 meq | EXTENDED_RELEASE_TABLET | Freq: Once | ORAL | Status: AC
  Administered 2020-04-21: 40 meq via ORAL
  Filled 2020-04-21: qty 2

## 2020-04-21 MED ORDER — POTASSIUM CHLORIDE 10 MEQ/100ML IV SOLN
10.0000 meq | INTRAVENOUS | Status: DC
  Administered 2020-04-21 (×4): 10 meq via INTRAVENOUS
  Filled 2020-04-21 (×4): qty 100

## 2020-04-21 MED ORDER — MAGNESIUM SULFATE 4 GM/100ML IV SOLN
4.0000 g | Freq: Once | INTRAVENOUS | Status: AC
  Administered 2020-04-21: 4 g via INTRAVENOUS
  Filled 2020-04-21: qty 100

## 2020-04-21 MED ORDER — POTASSIUM CHLORIDE CRYS ER 20 MEQ PO TBCR: 40.0000 meq | EXTENDED_RELEASE_TABLET | ORAL | Status: DC

## 2020-04-21 MED ORDER — LACTATED RINGERS IV SOLN: INTRAVENOUS | Status: AC

## 2020-04-21 MED ORDER — LORAZEPAM 2 MG/ML IJ SOLN: 1.0000 mg | INTRAMUSCULAR | Status: DC | PRN

## 2020-04-21 NOTE — Progress Notes (Signed)
PROGRESS NOTE    JAIDEV SANGER  SAY:[id number] DOB: Aug 10, 1976 DOA: 04/17/2020 PCP: [name], NP    Brief Narrative:  Patient admitted to the hospital with a working diagnosis of delirium tremens, complicated by aspiration pneumonitis, persistent atrial fibrillation and bilateral knee noninfectious arthritis flare (present on admission).  44 year old male with history of alcohol abuse who presented with altered mentation.  Patient was brought to the hospital due to hallucinations, last drink 04/12/2020, apparently he took his girlfriends gabapentin.  His mentation rapidly deteriorated in the emergency department he was placed on a dexmedetomidine infusion.  He was noted to have left knee pain and edema. On his initial physical examination his blood pressure was 139/83, heart rate 64, temperature 37.9 C, respiratory rate 2, oxygen saturation 99%.  His RASS score was 2, his lungs were clear to auscultation bilaterally, heart S1-S2, present and rhythmic, abdomen soft, his left knee was edematous and warm. Sodium 129, potassium 2.6, chloride 90, bicarb 22, glucose 127, BUN 12, creatinine 0.72, AST 87, ALT 45, lactic acid 2.1, white count 5.9, hemoglobin 11.0, hematocrit 30.8, platelets 308.  SARS COVID-19 was negative.  Urinalysis specific gravity 1.016.  Left knee synovial fluid with 7182 white cell count, 89% neutrophils.  Toxicology screen negative.  Head CT negative for acute changes.  Chest radiograph with no infiltrates, CT chest with chronic pulmonary embolism (posterior lobe subsegmental branch)/ ground glass infiltrates at right lower lobe.  EKG 90 bpm, normal axis, normal intervals, sinus rhythm with PAC/PVC, no ST segment or T wave changes.  Patient was slowly weaned off dexmedetomidine, he was placed on Seroquel, Valium and Cymbalta.    06/16 He underwent bilateral knee aspiration with instillation of Marcaine in Depo-Medrol.    He was diagnosed with aspiration pneumonitis and  placed on antibiotic therapy.  His hospitalization was complicated by persistent atrial fibrillation, he was placed on anticoagulation with enoxaparin.  Assessment & Plan:   Principal Problem:   Delirium tremens (HCC) Active Problems:   Type 2 diabetes mellitus with hyperlipidemia (HCC)   Essential hypertension   Alcohol use   Arthritis of both knees   Hyponatremia   Aspiration pneumonia (HCC)   1. Delirium tremens, alcohol withdrawal syndrome. Patient is more calm today, no tremors or anxiety. Has been off dexmedetomidine since 06/18.   Continue with: diazepam 2 mg tid                         Duloxetine 20 mg daily                          Quetiapine 50 mg bid                          Lorazepam PRN change to 1 mg q 4 H as needed.   Patient will need to be out of bed to chair tid with meals, PT and OT evaluation. Continue multivitamins, thiamine and folic acid supplementation.   2. New onset atrial fibrillation. Rate continue to be uncontrolled, 110 to 132 bpm.   Will increase metoprolol to 75 mg po bid and continue telemetry monitoring. Patient has been placed on anticoagulation, with full dose enoxaparin, his CHADS vasc score is 2 due to T2DM and HTN. Patient may not be a good candidate for outpatient anticoagulation considering his alcohol abuse.   Further work up with echocardiogram.   3. Aspiration pneumonitis (  present on admission). Oxygenation is 96% on room air. Blood culture coag negative sapth, gram positive rods, MRSA, considered contamination.   Today completed #5 dose of high dose ceftriaxone.   4. Bilateral knee arthritis/ chronic arthritis/ osteoarthritis. Pain has improved but not yet back to baseline. Infectious arthritis has been ruled out.   Will repeat blood cultures today. Continue pain control with morphine and oxycodone.   5. T2DM (Hgb A1c 5.4) . Fasting glucose this am 144. Continue glucose cover and monitoring with insulin sliding scale, patient is  tolerating po well.   6. HTN. Blood pressure has remained well controlled.   7. Hyponatremia, hypokalemia and hypomagnesemia. Stable renal function with serum cr at 0,58, K at 3,4 and serum bicarbonate at 26. Mg continue to be low at 1,4.  Continue Mg correction with Mag sulfate and will add Kcl 40 meq x 2 doses, follow renal panel in am.   8. Hx of DVT and PE in 09/20/ anemia of chronic disease. Patient back on full anticoagulation in the setting of atrial fibrillation.   9. Hx of erosive esophagitis. Patient tolerating po well, continue antiacid therapy with pantoprazole and sucralfate.   Patient continue to be at high risk for worsening atrial fibrillation.   Status is: Inpatient  Remains inpatient appropriate because:Inpatient level of care appropriate due to severity of illness   Dispo: The patient is from: Home              Anticipated d/c is to: Home              Anticipated d/c date is: 3 days              Patient currently is not medically stable to d/c.   DVT prophylaxis: Enoxaparin   Code Status:   full  Family Communication:  No family at the bedside     Consultants:   orthopedics  Procedures:   Left knee arthrocentesis   Antimicrobials:   Completed ceftriaxone     Subjective: Patient continue to feel very weak and deconditioned, no nausea or vomiting, no chest pain, palpitations or dyspnea.   Objective: Vitals:   04/21/20 0300 04/21/20 0400 04/21/20 0500 04/21/20 0817  BP:  (!) 139/92    Pulse: (!) 126 (!) 110 (!) 133   Resp: 15 18 19    Temp:  98.6 F (37 C)  97.6 F (36.4 C)  TempSrc:  Oral  Oral  SpO2: 98% 96% 96%   Weight:      Height:        Intake/Output Summary (Last 24 hours) at 04/21/2020 04/23/2020 Last data filed at 04/21/2020 0400 Gross per 24 hour  Intake 1121.56 ml  Output 675 ml  Net 446.56 ml   Filed Weights   04/16/20 2122  Weight: 97.5 kg    Examination:   General: Not in pain or dyspnea, deconditioned  Neurology: Awake  and alert, non focal  E ENT: no pallor, no icterus, oral mucosa moist Cardiovascular: No JVD. S1-S2 present, irregularly irregular, no gallops, rubs, or murmurs. No lower extremity edema. Pulmonary: positive breath sounds bilaterally, adequate air movement, no wheezing, rhonchi or rales. Gastrointestinal. Abdomen with, no organomegaly, non tender, no rebound or guarding Skin. No rashes/ no erythema.  Musculoskeletal: bilateral hands distal IPJ deformities, bilateral knee hypertrophy. Non tender to palpation.      Data Reviewed: I have personally reviewed following labs and imaging studies  CBC: Recent Labs  Lab 04/17/20 0810 04/18/20 0222 04/19/20 0215 04/20/20  0554 04/21/20 0315  WBC 5.2 4.7 5.0 6.2 8.4  HGB 9.9* 9.2* 10.0* 10.2* 10.0*  HCT 28.5* 26.3* 29.3* 30.0* 30.1*  MCV 90.5 89.8 91.6 90.6 92.0  PLT 272 312 402* 518* 595*   Basic Metabolic Panel: Recent Labs  Lab 04/17/20 0810 04/17/20 0810 04/17/20 2004 04/17/20 2004 04/18/20 0222 04/18/20 1256 04/19/20 0215 04/20/20 0554 04/21/20 0315  NA 133*   < > 134*   < > 131* 132* 132* 137 137  K 2.6*   < > 3.0*   < > 3.2* 3.6 3.7 4.0 3.4*  CL 98   < > 101   < > 100 99 100 109 103  CO2 22   < > 24   < > 23 24 21* 22 26  GLUCOSE 125*   < > 195*   < > 160* 156* 213* 157* 144*  BUN 12   < > 10   < > 8 6 9 11 10   CREATININE 0.61   < > 0.58*   < > 0.54* 0.56* 0.56* 0.56* 0.58*  CALCIUM 6.9*   < > 7.2*   < > 7.4* 7.9* 8.0* 7.9* 8.0*  MG 1.6*  --  1.9  --   --   --  1.5* 1.6* 1.4*  PHOS 3.7  --   --   --   --   --   --   --   --    < > = values in this interval not displayed.   GFR: Estimated Creatinine Clearance: 155.5 mL/min (A) (by C-G formula based on SCr of 0.58 mg/dL (L)). Liver Function Tests: Recent Labs  Lab 04/16/20 2315 04/17/20 0810 04/21/20 0315  AST 87* 62* 41  ALT 45* 37 43  ALKPHOS 60 51 56  BILITOT 1.1 1.2 0.5  PROT 7.4 6.2* 6.3*  ALBUMIN 2.9* 2.4* 2.6*   No results for input(s): LIPASE,  AMYLASE in the last 168 hours. Recent Labs  Lab 04/17/20 0416  AMMONIA 29   Coagulation Profile: No results for input(s): INR, PROTIME in the last 168 hours. Cardiac Enzymes: Recent Labs  Lab 04/16/20 2315  CKTOTAL 328   BNP (last 3 results) No results for input(s): PROBNP in the last 8760 hours. HbA1C: No results for input(s): HGBA1C in the last 72 hours. CBG: Recent Labs  Lab 04/20/20 0808 04/20/20 1148 04/20/20 1618 04/20/20 1916 04/21/20 0743  GLUCAP 152* 155* 78 104* 120*   Lipid Profile: No results for input(s): CHOL, HDL, LDLCALC, TRIG, CHOLHDL, LDLDIRECT in the last 72 hours. Thyroid Function Tests: No results for input(s): TSH, T4TOTAL, FREET4, T3FREE, THYROIDAB in the last 72 hours. Anemia Panel: Recent Labs    04/20/20 0554  FERRITIN 455*  TIBC 118*  IRON 26*      Radiology Studies: I have reviewed all of the imaging during this hospital visit personally     Scheduled Meds:  chlorhexidine  15 mL Mouth Rinse BID   Chlorhexidine Gluconate Cloth  6 each Topical Q0600   diazepam  2 mg Oral Q8H   DULoxetine  20 mg Oral Daily   enoxaparin (LOVENOX) injection  100 mg Subcutaneous Q12H   folic acid  1 mg Oral Daily   insulin aspart  0-15 Units Subcutaneous TID WC   insulin aspart  0-5 Units Subcutaneous QHS   mouth rinse  15 mL Mouth Rinse q12n4p   metoprolol tartrate  50 mg Oral BID   multivitamin with minerals  1 tablet Oral Daily  pantoprazole  40 mg Oral Daily   QUEtiapine  50 mg Oral BID   sodium chloride flush  10-40 mL Intracatheter Q12H   sucralfate  1 g Oral TID WC & HS   thiamine  100 mg Oral Daily   Continuous Infusions:  sodium chloride     cefTRIAXone (ROCEPHIN)  IV Stopped (04/20/20 0950)   lactated ringers 50 mL/hr at 04/21/20 0121   potassium chloride 10 mEq (04/21/20 0819)     LOS: 4 days        Ailin Rochford Gerome Apley, MD

## 2020-04-21 NOTE — Progress Notes (Addendum)
[facility] ADULT ICU REPLACEMENT PROTOCOL FOR AM LAB REPLACEMENT ONLY  The patient does apply for the [facility] Adult ICU Electrolyte Replacement Protocol based on the criteria listed below:   Abnormal electrolyte(s): Mg 2.4  K 3.4  Adriann Thau DNP Elink RN 05:06 AM

## 2020-04-22 ENCOUNTER — Inpatient Hospital Stay (HOSPITAL_COMMUNITY): Payer: Self-pay

## 2020-04-22 DIAGNOSIS — I4891 Unspecified atrial fibrillation: Secondary | ICD-10-CM

## 2020-04-22 HISTORY — DX: Unspecified atrial fibrillation: I48.91

## 2020-04-22 LAB — ECHOCARDIOGRAM COMPLETE

## 2020-04-22 LAB — GLUCOSE, CAPILLARY
Glucose-Capillary: 133 mg/dL — ABNORMAL HIGH (ref 70–99)
Glucose-Capillary: 78 mg/dL (ref 70–99)
Glucose-Capillary: 123 mg/dL — ABNORMAL HIGH (ref 70–99)
Glucose-Capillary: 107 mg/dL — ABNORMAL HIGH (ref 70–99)

## 2020-04-22 LAB — BASIC METABOLIC PANEL
Anion gap: 7 (ref 5–15)
BUN: 6 mg/dL (ref 6–20)
CO2: 25 mmol/L (ref 22–32)
Calcium: 7.9 mg/dL — ABNORMAL LOW (ref 8.9–10.3)
Chloride: 102 mmol/L (ref 98–111)
Creatinine, Ser: 0.64 mg/dL (ref 0.61–1.24)
GFR calc Af Amer: >60 mL/min (ref >60)
GFR calc non Af Amer: >60 mL/min (ref >60)
Glucose, Bld: 121 mg/dL — ABNORMAL HIGH (ref 70–99)
Potassium: 3.6 mmol/L (ref 3.5–5.1)
Sodium: 134 mmol/L — ABNORMAL LOW (ref 135–145)

## 2020-04-22 LAB — CBC WITH DIFFERENTIAL/PLATELET
Abs Immature Granulocytes: 0.08 10*3/uL — ABNORMAL HIGH (ref 0.00–0.07)
Basophils Absolute: 0 10*3/uL (ref 0.0–0.1)
Basophils Relative: 0 %
Eosinophils Absolute: 0.1 10*3/uL (ref 0.0–0.5)
Eosinophils Relative: 1 %
HCT: 29.3 % — ABNORMAL LOW (ref 39.0–52.0)
Hemoglobin: 9.7 g/dL — ABNORMAL LOW (ref 13.0–17.0)
Immature Granulocytes: 1 %
Lymphocytes Relative: 22 %
Lymphs Abs: 1.6 10*3/uL (ref 0.7–4.0)
MCH: 30.3 pg (ref 26.0–34.0)
MCHC: 33.1 g/dL (ref 30.0–36.0)
MCV: 91.6 fL (ref 80.0–100.0)
Monocytes Absolute: 0.6 10*3/uL (ref 0.1–1.0)
Monocytes Relative: 8 %
Neutro Abs: 5.1 10*3/uL (ref 1.7–7.7)
Neutrophils Relative %: 68 %
Platelets: 586 10*3/uL — ABNORMAL HIGH (ref 150–400)
RBC: 3.2 MIL/uL — ABNORMAL LOW (ref 4.22–5.81)
RDW: 15 % (ref 11.5–15.5)
WBC: 7.4 10*3/uL (ref 4.0–10.5)
nRBC: 0 % (ref 0.0–0.2)

## 2020-04-22 LAB — MAGNESIUM: Magnesium: 1.4 mg/dL — ABNORMAL LOW (ref 1.7–2.4)

## 2020-04-22 MED ORDER — PERFLUTREN LIPID MICROSPHERE
1.0000 mL | INTRAVENOUS | Status: AC | PRN
  Administered 2020-04-22: 2 mL via INTRAVENOUS
  Filled 2020-04-22: qty 10

## 2020-04-22 MED ORDER — DICLOFENAC SODIUM 1 % EX GEL
2.0000 g | Freq: Four times a day (QID) | CUTANEOUS | Status: DC
  Administered 2020-04-22 – 2020-04-23 (×5): 2 g via TOPICAL
  Filled 2020-04-22: qty 100

## 2020-04-22 MED ORDER — POTASSIUM CHLORIDE CRYS ER 20 MEQ PO TBCR
40.0000 meq | EXTENDED_RELEASE_TABLET | Freq: Once | ORAL | Status: AC
  Administered 2020-04-22: 40 meq via ORAL
  Filled 2020-04-22: qty 2

## 2020-04-22 MED ORDER — DILTIAZEM HCL 30 MG PO TABS
30.0000 mg | ORAL_TABLET | Freq: Four times a day (QID) | ORAL | Status: DC
  Administered 2020-04-22 – 2020-04-23 (×6): 30 mg via ORAL
  Filled 2020-04-22 (×6): qty 1

## 2020-04-22 MED ORDER — MAGNESIUM SULFATE 2 GM/50ML IV SOLN
2.0000 g | Freq: Once | INTRAVENOUS | Status: AC
  Administered 2020-04-22: 2 g via INTRAVENOUS
  Filled 2020-04-22: qty 50

## 2020-04-22 NOTE — Evaluation (Signed)
Physical Therapy Evaluation Patient Details Name: Richard Lopez MRN: [id number] DOB: [birth year] Today's Date: 04/22/2020   History of Present Illness  Pt admitted with AMS and delirium tremons complicated by Aspiration PNA, a-fib, and bilat knee arthritis flare.  Bil knee aspiratioin 6/16.  Pt with hx of DM, PE, bil LE IM nailing and ETOH abuse  Clinical Impression  Pt admitted as above and presenting with functional mobility limitations 2* generalized weakness and ambulatory balance deficits.  Pt should progress to dc home with intermittent assist of family/friends.  Pt states he has been through rehab multiple times and knows to "use my walker until I am steadier".      Follow Up Recommendations Supervision - Intermittent (dependent on acute stay progress)    Equipment Recommendations  None recommended by PT    Recommendations for Other Services       Precautions / Restrictions Precautions Precautions: Fall Restrictions Weight Bearing Restrictions: No      Mobility  Bed Mobility               General bed mobility comments: Pt up in recliner and requests back to same  Transfers Overall transfer level: Needs assistance Equipment used: Rolling walker (2 wheeled) Transfers: Sit to/from Stand Sit to Stand: Min guard;Min assist         General transfer comment: cues for hand placement  Ambulation/Gait Ambulation/Gait assistance: Min assist;Min guard Gait Distance (Feet): 430 Feet Assistive device: Rolling walker (2 wheeled) Gait Pattern/deviations: Step-through pattern;Decreased step length - right;Decreased step length - left;Shuffle;Trunk flexed     General Gait Details: cues for posture and position from [employer]            Wheelchair Mobility    Modified [place] (Stroke Patients Only)       Balance Overall balance assessment: Needs assistance Sitting-balance support: Feet supported Sitting balance-Leahy Scale: Good     Standing balance  support: During functional activity;Bilateral upper extremity supported;Single extremity supported Standing balance-Leahy Scale: Poor                               Pertinent Vitals/Pain Pain Assessment: Faces Faces Pain Scale: Hurts a little bit Pain Location: bil knees Pain Descriptors / Indicators: Sore Pain Intervention(s): Limited activity within patient's tolerance;Monitored during session    Home Living Family/patient expects to be discharged to:: Private residence Living Arrangements: Alone Available Help at Discharge: Friend(s);Available PRN/intermittently Type of Home: House Home Access: Stairs to enter Entrance Stairs-Rails: None Entrance Stairs-Number of Steps: 2 Home Layout: One level Home Equipment: Crutches;Walker - 2 wheels;Cane - single point;Bedside commode;Shower seat;Wheelchair - manual      Prior Function Level of Independence: Independent         Comments: pt reports got back to walking a mile with his girlfriend without AD     Hand Dominance   Dominant Hand: Right    Extremity/Trunk Assessment   Upper Extremity Assessment Upper Extremity Assessment: Overall WFL for tasks assessed    Lower Extremity Assessment Lower Extremity Assessment: Generalized weakness    Cervical / Trunk Assessment Cervical / Trunk Assessment: Normal  Communication   Communication: No difficulties  Cognition Arousal/Alertness: Awake/alert Behavior During Therapy: WFL for tasks assessed/performed Overall Cognitive Status: Within Functional Limits for tasks assessed  General Comments: min cues for safety with managing walker      General Comments General comments (skin integrity, edema, etc.): HR elevated to 157 with ambulation; pt fatigued following but denies dizziness or SOB    Exercises     Assessment/Plan    PT Assessment Patient needs continued PT services  PT Problem List Decreased  strength;Decreased activity tolerance;Decreased balance;Decreased mobility;Decreased knowledge of use of DME       PT Treatment Interventions DME instruction;Gait training;Stair training;Therapeutic activities;Functional mobility training;Therapeutic exercise;Balance training;Patient/family education    PT Goals (Current goals can be found in the Care Plan section)  Acute Rehab PT Goals Patient Stated Goal: get home PT Goal Formulation: With patient Time For Goal Achievement: 05/12/20 Potential to Achieve Goals: Good    Frequency Min 3X/week   Barriers to discharge Decreased caregiver support home alone    Co-evaluation PT/OT/SLP Co-Evaluation/Treatment: Yes Reason for Co-Treatment: For patient/therapist safety;Complexity of the patient's impairments (multi-system involvement) PT goals addressed during session: Mobility/safety with mobility OT goals addressed during session: ADL's and self-care       AM-PAC PT "6 Clicks" Mobility  Outcome Measure Help needed turning from your back to your side while in a flat bed without using bedrails?: A Little Help needed moving from lying on your back to sitting on the side of a flat bed without using bedrails?: A Little Help needed moving to and from a bed to a chair (including a wheelchair)?: A Little Help needed standing up from a chair using your arms (e.g., wheelchair or bedside chair)?: A Little Help needed to walk in hospital room?: A Little Help needed climbing 3-5 steps with a railing? : A Lot 6 Click Score: 17    End of Session Equipment Utilized During Treatment: Gait belt Activity Tolerance: Patient limited by fatigue;Patient tolerated treatment well Patient left: in chair;with call bell/phone within reach;with chair alarm set Nurse Communication: Mobility status PT Visit Diagnosis: Other abnormalities of gait and mobility (R26.89);Unsteadiness on feet (R26.81);Muscle weakness (generalized) (M62.81);Difficulty in walking, not  elsewhere classified (R26.2);Dizziness and giddiness (R42)    Time: 8119-1478 PT Time Calculation (min) (ACUTE ONLY): 17 min   Charges:   PT Evaluation $PT Eval Low Complexity: 1 Low          [place] Pager [phone] Office [phone]   Takasha Vetere 04/22/2020, 12:50 PM

## 2020-04-22 NOTE — Progress Notes (Signed)
  Echocardiogram 2D Echocardiogram with definity has been performed.  Leta Jungling M 04/22/2020, 9:55 AM

## 2020-04-22 NOTE — Evaluation (Signed)
Occupational Therapy Evaluation Patient Details Name: Richard Lopez MRN: [id number] DOB: Apr 21, 1976 Today's Date: 04/22/2020    History of Present Illness 44 year old male with history of alcohol abuse who presented with altered mentation.  Patient was brought to the hospital due to hallucinations, last drink 04/12/2020, apparently he took his girlfriends gabapentin.  His mentation rapidly deteriorated in the emergency department and he was placed on a dexmedetomidine infusion.  He was noted to have left knee pain and edema. 06/16 He underwent bilateral knee aspiration with instillation of Marcaine in Depo-Medrol   Clinical Impression   Patient with functional deficits listed below impacting safety and independence with self care. Patient set up to don shoes seated in chair, min A to stand from recliner with min cues for hand placement. Pt min A for functional ambulation with rolling walker for safety due to sore B knees, cues for management of walker. Will continue to follow.     Follow Up Recommendations  No OT follow up    Equipment Recommendations  None recommended by OT;Other (comment) (patient has necessary DME)       Precautions / Restrictions Precautions Precautions: Fall Restrictions Weight Bearing Restrictions: No      Mobility Bed Mobility               General bed mobility comments: OOB in recliner  Transfers Overall transfer level: Needs assistance Equipment used: Rolling walker (2 wheeled) Transfers: Sit to/from Stand Sit to Stand: Min guard;Min assist         General transfer comment: cues for hand placement    Balance Overall balance assessment: Needs assistance Sitting-balance support: Feet supported Sitting balance-Leahy Scale: Good     Standing balance support: During functional activity;Bilateral upper extremity supported;Single extremity supported Standing balance-Leahy Scale: Poor                             ADL either  performed or assessed with clinical judgement   ADL Overall ADL's : Needs assistance/impaired     Grooming: Set up;Sitting   Upper Body Bathing: Set up;Sitting   Lower Body Bathing: Min guard;Minimal assistance;Sit to/from stand;Sitting/lateral leans   Upper Body Dressing : Set up;Sitting   Lower Body Dressing: Min guard;Minimal assistance;Sitting/lateral leans;Sit to/from stand Lower Body Dressing Details (indicate cue type and reason): able to don shoes seated in recliner Toilet Transfer: Minimal assistance;Ambulation;RW Toilet Transfer Details (indicate cue type and reason): simulated with functional mobility and transfer back to recliner, min A for safety with min cues for managing FWW Toileting- Clothing Manipulation and Hygiene: Min guard;Minimal assistance;Sitting/lateral lean;Sit to/from stand       Functional mobility during ADLs: Minimal assistance;Rolling walker;Cueing for safety General ADL Comments: patient appears close to baseline with self care, min A for safety due to mild unsteadiness and cues for managing walker                  Pertinent Vitals/Pain Pain Assessment: Faces Faces Pain Scale: Hurts a little bit Pain Location: bil knees Pain Descriptors / Indicators: Sore Pain Intervention(s): Monitored during session     Hand Dominance Right   Extremity/Trunk Assessment Upper Extremity Assessment Upper Extremity Assessment: Overall WFL for tasks assessed   Lower Extremity Assessment Lower Extremity Assessment: Defer to PT evaluation   Cervical / Trunk Assessment Cervical / Trunk Assessment: Normal   Communication Communication Communication: No difficulties   Cognition Arousal/Alertness: Awake/alert Behavior During Therapy: WFL for tasks assessed/performed Overall  Cognitive Status: Within Functional Limits for tasks assessed                                 General Comments: min cues for safety with managing walker   General  Comments  note HR up to 157 with ambulation, reports feeling tired after ambulation no dizziness or shortness of breath            Home Living Family/patient expects to be discharged to:: Private residence Living Arrangements: Alone Available Help at Discharge: Friend(s);Available PRN/intermittently Type of Home: House Home Access: Stairs to enter [employer] of Steps: 2 Entrance Stairs-Rails: None Home Layout: One level     Bathroom Shower/Tub: Chief Strategy Officer: Handicapped height     Home Equipment: Crutches;Walker - 2 wheels;Cane - single point;Bedside commode;Shower seat;Wheelchair - manual          Prior Functioning/Environment Level of Independence: Independent        Comments: pt reports got back to walking a mile with his girlfriend without AD        OT Problem List: Decreased activity tolerance;Impaired balance (sitting and/or standing);Decreased safety awareness      OT Treatment/Interventions: Self-care/ADL training;Therapeutic exercise;Energy conservation;DME and/or AE instruction;Therapeutic activities;Patient/family education;Balance training    OT Goals(Current goals can be found in the care plan section) Acute Rehab OT Goals Patient Stated Goal: get home OT Goal Formulation: With patient Time For Goal Achievement: 05/06/20 Potential to Achieve Goals: Good  OT Frequency: Min 2X/week           Co-evaluation PT/OT/SLP Co-Evaluation/Treatment: Yes Reason for Co-Treatment: Complexity of the patient's impairments (multi-system involvement);Necessary to address cognition/behavior during functional activity;For patient/therapist safety;To address functional/ADL transfers   OT goals addressed during session: ADL's and self-care      AM-PAC OT "6 Clicks" Daily Activity     Outcome Measure Help from another person eating meals?: None Help from another person taking care of personal grooming?: A Little Help from another  person toileting, which includes using toliet, bedpan, or urinal?: A Little Help from another person bathing (including washing, rinsing, drying)?: A Little Help from another person to put on and taking off regular upper body clothing?: A Little Help from another person to put on and taking off regular lower body clothing?: A Little 6 Click Score: 19   End of Session Equipment Utilized During Treatment: Rolling walker;Gait belt Nurse Communication: Mobility status  Activity Tolerance: Patient tolerated treatment well Patient left: in chair;with call bell/phone within reach  OT Visit Diagnosis: Unsteadiness on feet (R26.81);Other abnormalities of gait and mobility (R26.89)                Time: 9485-4627 OT Time Calculation (min): 18 min Charges:  OT General Charges $OT Visit: 1 Visit OT Evaluation $OT Eval Moderate Complexity: 1 Mod  Marlyce Huge OT Pager: [phone]  Carmelia Roller 04/22/2020, 12:25 PM

## 2020-04-22 NOTE — Progress Notes (Signed)
PROGRESS NOTE    Richard Lopez  ZYS:[id number] DOB: 02/17/1976 DOA: 04/17/2020 PCP: Kerin Perna, NP    Brief Narrative:  Patient admitted to the hospital with a working diagnosis of delirium tremens, complicated by aspiration pneumonitis and bilateral knee noninfectious arthritis flare (present on admission). During hospitalization developed new atrial fibrillation.   44 year old male with history of alcohol abuse who presented with altered mentation.  Patient was brought to the hospital due to hallucinations, last drink 04/12/2020, apparently he took his girlfriends gabapentin.  His mentation rapidly deteriorated in the emergency department and he was placed on a dexmedetomidine infusion.  He was noted to have left knee pain and edema. On his initial physical examination his blood pressure was 139/83, heart rate 64, temperature 37.9 C, respiratory rate 22, oxygen saturation 99%.  His RASS score was 2, his lungs were clear to auscultation bilaterally, heart S1-S2, present and rhythmic, abdomen soft, his left knee was edematous and warm. Sodium 129, potassium 2.6, chloride 90, bicarb 22, glucose 127, BUN 12, creatinine 0.72, AST 87, ALT 45, lactic acid 2.1, white count 5.9, hemoglobin 11.0, hematocrit 30.8, platelets 308.  SARS COVID-19 was negative.  Urinalysis specific gravity 1.016.  Left knee synovial fluid with 7182 white cell count, 89% neutrophils.  Toxicology screen negative.  Head CT negative for acute changes.  Chest radiograph with no infiltrates, CT chest with chronic pulmonary embolism (posterior lobe subsegmental branch)/ ground glass infiltrates at right lower lobe.  EKG 90 bpm, normal axis, normal intervals, sinus rhythm with PAC/PVC, no ST segment or T wave changes.  Patient was slowly weaned off dexmedetomidine, he was placed on Seroquel, Valium and Cymbalta.    06/16 He underwent bilateral knee aspiration with instillation of Marcaine in Depo-Medrol.    He was diagnosed  with aspiration pneumonitis and placed on antibiotic therapy.  His hospitalization was complicated by persistent atrial fibrillation, he was placed on anticoagulation with enoxaparin.    Assessment & Plan:   Principal Problem:   Delirium tremens ([place]) Active Problems:   Type 2 diabetes mellitus with hyperlipidemia ([place])   Essential hypertension   Alcohol use   Arthritis of both knees   Hyponatremia   Aspiration pneumonia ([place])   1. Delirium tremens, alcohol withdrawal syndrome.  Has been off dexmedetomidine since 06/18. Patient with no significant tremors or anxiety.  Tolerating well medical therapy with:                         diazepam 2 mg tid                         Duloxetine 20 mg daily                          Quetiapine 50 mg bid                         Lorazepam PRN change to 1 mg q 4 H as needed.   On multivitamins, thiamine and folic acid supplementation. Pending PT and OT evaluation.   2. New onset atrial fibrillation. His CHADS vasc score is 2 due to T2DM and HTN.  Rate remaines uncontrolled despite increased dose of metoprolol to 75 mg bid.  Will continue metoprolol 75 mg po bid and will add diltiazem 30 mg po q 6 H. Continue telemetry monitoring.   3. Aspiration pneumonitis (present  on admission). Patient has completed antibiotic therapy with ceftriaxone #5/5. Blood culture coag negative sapth, gram positive rods, MRSA, considered contamination.  No dyspnea or chest pain.  Oxygenation is 99% to 100% on room air.   4. Bilateral knee arthritis/ chronic arthritis/ osteoarthritis. Infectious arthritis has been ruled out.   Will add topical diclofenac for better pain control, continue with morphine and oxycodone as needed.   5. T2DM (Hgb A1c 5.4) . Fasting glucose this am 121. On insulin sliding scale, for glucose cover and monitoring. Patient continue tolerating po well.   6. HTN.  Blood pressure 139/93, continue close monitoring.   7. Hyponatremia,  hypokalemia and hypomagnesemia. Renal function with serum cr at 0,64, K at 3,6 and serum bicarbonate at 25, Mg 1,4.   Will add 40 meq Kcl and 2 g Mag sulfate this am, continue close monitoring electrolytes, target K at 4 and Mg at 2.   8. Hx of DVT and PE in 09/20/ anemia of chronic disease. Continue with anticoagulation with full dose enoxaparin in the setting of atrial fibrillation.   9. Hx of erosive esophagitis. On pantoprazole and sucralfate with good toleration.    Patient continue to be at high risk for worsening atrial fibrillation.   Status is: Inpatient  Remains inpatient appropriate because:Inpatient level of care appropriate due to severity of illness   Dispo: The patient is from: Home              Anticipated d/c is to: Home              Anticipated d/c date is: 3 days              Patient currently is not medically stable to d/c.   DVT prophylaxis: Enoxaparin   Code Status:   full  Family Communication:  No family at the bedside     Consultants:   orthopedics  Procedures:   Left knee arthrocentesis   Antimicrobials:   Completed ceftriaxone     Subjective: Patient continue to have left knee pain, no nausea or vomiting, no chest pain or palpitations. No dyspnea,. Continue to be very weak and deconditioned.   Objective: Vitals:   04/22/20 0400 04/22/20 0500 04/22/20 0630 04/22/20 0632  BP:   (!) 171/119   Pulse: (!) 103 (!) 104  (!) 101  Resp: (!) 21 13  16   Temp: 97.8 F (36.6 C)     TempSrc: Axillary     SpO2: 100% 99%  100%  Weight:      Height:        Intake/Output Summary (Last 24 hours) at 04/22/2020 0804 Last data filed at 04/22/2020 0500 Gross per 24 hour  Intake 1575.25 ml  Output 2125 ml  Net -549.75 ml   Filed Weights   04/16/20 2122  Weight: 97.5 kg    Examination:   General: Not in pain or dyspnea, deconditioned  Neurology: Awake and alert, non focal  E ENT: no pallor, no icterus, oral mucosa moist Cardiovascular:  No JVD. S1-S2 present, rhythmic, no gallops, rubs, or murmurs. No lower extremity edema. Pulmonary: positive reath sounds bilaterally, adequate air movement, no wheezing, rhonchi or rales. Gastrointestinal. Abdomen with no organomegaly, non tender, no rebound or guarding Skin. No rashes Musculoskeletal: bilateral knee hypertrophy.      Data Reviewed: I have personally reviewed following labs and imaging studies  CBC: Recent Labs  Lab 04/18/20 0222 04/19/20 0215 04/20/20 0554 04/21/20 0315 04/22/20 0256  WBC 4.7 5.0 6.2 8.4  7.4  NEUTROABS  --   --   --   --  5.1  HGB 9.2* 10.0* 10.2* 10.0* 9.7*  HCT 26.3* 29.3* 30.0* 30.1* 29.3*  MCV 89.8 91.6 90.6 92.0 91.6  PLT 312 402* 518* 595* 586*   Basic Metabolic Panel: Recent Labs  Lab 04/17/20 0810 04/17/20 0810 04/17/20 2004 04/18/20 0222 04/18/20 1256 04/19/20 0215 04/20/20 0554 04/21/20 0315 04/22/20 0256  NA 133*   < > 134*   < > 132* 132* 137 137 134*  K 2.6*   < > 3.0*   < > 3.6 3.7 4.0 3.4* 3.6  CL 98   < > 101   < > 99 100 109 103 102  CO2 22   < > 24   < > 24 21* 22 26 25   GLUCOSE 125*   < > 195*   < > 156* 213* 157* 144* 121*  BUN 12   < > 10   < > 6 9 11 10 6   CREATININE 0.61   < > 0.58*   < > 0.56* 0.56* 0.56* 0.58* 0.64  CALCIUM 6.9*   < > 7.2*   < > 7.9* 8.0* 7.9* 8.0* 7.9*  MG 1.6*   < > 1.9  --   --  1.5* 1.6* 1.4* 1.4*  PHOS 3.7  --   --   --   --   --   --   --   --    < > = values in this interval not displayed.   GFR: Estimated Creatinine Clearance: 155.5 mL/min (by C-G formula based on SCr of 0.64 mg/dL). Liver Function Tests: Recent Labs  Lab 04/16/20 2315 04/17/20 0810 04/21/20 0315  AST 87* 62* 41  ALT 45* 37 43  ALKPHOS 60 51 56  BILITOT 1.1 1.2 0.5  PROT 7.4 6.2* 6.3*  ALBUMIN 2.9* 2.4* 2.6*   No results for input(s): LIPASE, AMYLASE in the last 168 hours. Recent Labs  Lab 04/17/20 0416  AMMONIA 29   Coagulation Profile: No results for input(s): INR, PROTIME in the last 168  hours. Cardiac Enzymes: Recent Labs  Lab 04/16/20 2315  CKTOTAL 328   BNP (last 3 results) No results for input(s): PROBNP in the last 8760 hours. HbA1C: No results for input(s): HGBA1C in the last 72 hours. CBG: Recent Labs  Lab 04/20/20 1916 04/21/20 0743 04/21/20 1158 04/21/20 1627 04/21/20 2120  GLUCAP 104* 120* 114* 147* 108*   Lipid Profile: No results for input(s): CHOL, HDL, LDLCALC, TRIG, CHOLHDL, LDLDIRECT in the last 72 hours. Thyroid Function Tests: No results for input(s): TSH, T4TOTAL, FREET4, T3FREE, THYROIDAB in the last 72 hours. Anemia Panel: Recent Labs    04/20/20 0554  FERRITIN 455*  TIBC 118*  IRON 26*      Radiology Studies: I have reviewed all of the imaging during this hospital visit personally     Scheduled Meds:  acetaminophen  650 mg Oral Q6H   chlorhexidine  15 mL Mouth Rinse BID   Chlorhexidine Gluconate Cloth  6 each Topical Q0600   diazepam  2 mg Oral Q8H   DULoxetine  20 mg Oral Daily   enoxaparin (LOVENOX) injection  100 mg Subcutaneous Q12H   folic acid  1 mg Oral Daily   insulin aspart  0-15 Units Subcutaneous TID WC   insulin aspart  0-5 Units Subcutaneous QHS   mouth rinse  15 mL Mouth Rinse q12n4p   metoprolol tartrate  75 mg Oral BID  multivitamin with minerals  1 tablet Oral Daily   pantoprazole  40 mg Oral Daily   QUEtiapine  50 mg Oral BID   sodium chloride flush  10-40 mL Intracatheter Q12H   sucralfate  1 g Oral TID WC & HS   thiamine  100 mg Oral Daily   Continuous Infusions:  sodium chloride Stopped (04/22/20 0006)   lactated ringers 50 mL/hr at 04/22/20 0500     LOS: 5 days        Ciela Mahajan Annett Gula, MD

## 2020-04-23 LAB — GLUCOSE, CAPILLARY
Glucose-Capillary: 125 mg/dL — ABNORMAL HIGH (ref 70–99)
Glucose-Capillary: 90 mg/dL (ref 70–99)
Glucose-Capillary: 98 mg/dL (ref 70–99)

## 2020-04-23 LAB — BASIC METABOLIC PANEL
Anion gap: 10 (ref 5–15)
BUN: 7 mg/dL (ref 6–20)
CO2: 25 mmol/L (ref 22–32)
Calcium: 7.8 mg/dL — ABNORMAL LOW (ref 8.9–10.3)
Chloride: 99 mmol/L (ref 98–111)
Creatinine, Ser: 0.61 mg/dL (ref 0.61–1.24)
GFR calc Af Amer: >60 mL/min (ref >60)
GFR calc non Af Amer: >60 mL/min (ref >60)
Glucose, Bld: 104 mg/dL — ABNORMAL HIGH (ref 70–99)
Potassium: 3.4 mmol/L — ABNORMAL LOW (ref 3.5–5.1)
Sodium: 134 mmol/L — ABNORMAL LOW (ref 135–145)

## 2020-04-23 LAB — MAGNESIUM: Magnesium: 1.4 mg/dL — ABNORMAL LOW (ref 1.7–2.4)

## 2020-04-23 MED ORDER — METOPROLOL TARTRATE 75 MG PO TABS: 75.0000 mg | ORAL_TABLET | Freq: Two times a day (BID) | ORAL | 0 refills | Status: DC

## 2020-04-23 MED ORDER — MAGNESIUM SULFATE 2 GM/50ML IV SOLN
2.0000 g | Freq: Once | INTRAVENOUS | Status: AC
  Administered 2020-04-23: 2 g via INTRAVENOUS

## 2020-04-23 MED ORDER — DILTIAZEM HCL ER COATED BEADS 120 MG PO CP24: 120.0000 mg | ORAL_CAPSULE | Freq: Every day | ORAL | Status: DC

## 2020-04-23 MED ORDER — DILTIAZEM HCL ER COATED BEADS 120 MG PO CP24: 120.0000 mg | ORAL_CAPSULE | Freq: Every day | ORAL | 0 refills | Status: DC

## 2020-04-23 MED ORDER — POTASSIUM CHLORIDE CRYS ER 20 MEQ PO TBCR
40.0000 meq | EXTENDED_RELEASE_TABLET | Freq: Once | ORAL | Status: AC
  Administered 2020-04-23: 40 meq via ORAL
  Filled 2020-04-23: qty 2

## 2020-04-23 MED ORDER — ACETAMINOPHEN 325 MG PO TABS: 650.0000 mg | ORAL_TABLET | Freq: Four times a day (QID) | ORAL | 0 refills | Status: DC

## 2020-04-23 MED ORDER — ASPIRIN 81 MG PO TBEC: 81.0000 mg | DELAYED_RELEASE_TABLET | Freq: Every day | ORAL | 11 refills | Status: DC

## 2020-04-23 MED ORDER — DICLOFENAC SODIUM 1 % EX GEL: 2.0000 g | Freq: Three times a day (TID) | CUTANEOUS | 0 refills | Status: DC

## 2020-04-23 MED ORDER — ASPIRIN EC 81 MG PO TBEC: 81.0000 mg | DELAYED_RELEASE_TABLET | Freq: Every day | ORAL | Status: DC

## 2020-04-23 NOTE — Plan of Care (Signed)

## 2020-04-23 NOTE — Discharge Summary (Signed)
Physician Discharge Summary  SHEDRIC FREDERICKS DGL:[id number] DOB: 1976-03-23 DOA: 04/17/2020  PCP: [name], NP  Admit date: 04/17/2020 Discharge date: 04/23/2020  Admitted From: Home  Disposition:  Home   Recommendations for Outpatient Follow-up and new medication changes:  1. Follow up with [name], NP in 7 days.  2. Patient will continue with metoprolol and diltiazem for rate control atrial fibrillation. 3. Antiplatelet therapy with aspirin.   Home Health: no   Equipment/Devices: no    Discharge Condition: stable  CODE STATUS: fukk  Diet recommendation: hear healthy   Brief/Interim Summary: Patient admitted to the hospital with a working diagnosis of delirium tremens, complicated by aspiration pneumonitis and bilateral knee noninfectious arthritis flare(present on admission). During hospitalization developed new atrial fibrillation wit rapid ventricular response   44 year old male with history of alcohol abuse who presented with altered mentation. Patient was brought to the hospital due to hallucinations, last drink 04/12/2020, apparently he took his girlfriends gabapentin. His mentation rapidly deteriorated in the emergency department and he was placed on a dexmedetomidine infusion. He was noted to have left knee pain and edema. On his initial physical examination his blood pressure was 139/83, heart rate 64, temperature 37.9 C, respiratoryrate22, oxygen saturation 99%. His RASS score was 2, his lungs were clear to auscultation bilaterally, heart S1-S2, present and rhythmic, abdomen soft, his left knee was edematous and warm. Sodium 129, potassium 2.6, chloride 90, bicarb 22, glucose 127, BUN 12, creatinine 0.72, AST 87, ALT 45, lactic acid 2.1, white count 5.9, hemoglobin 11.0, hematocrit 30.8, platelets 308. SARS COVID-19 was negative. Urinalysis specific gravity 1.016. Left knee synovial fluid with 7182 white cell count, 89% neutrophils. Toxicology screen  negative. Head CT negative for acute changes. Chest radiograph with no infiltrates, CT chest with chronic pulmonary embolism (posterior lobe subsegmental branch)/ ground glass infiltrates at right lower lobe. EKG 90 bpm, normal axis, normal intervals, sinus rhythm with PAC/PVC, no ST segment or T wave changes.  Patient was slowly weaned off dexmedetomidine, he was placed on Seroquel, Valium and Cymbalta.  06/16He underwent bilateral knee aspiration with instillation of Marcaine in Depo-Medrol.  He was diagnosed with aspiration pneumonitis and placed on antibiotic therapy. His hospitalization was complicated by persistent atrial fibrillation, and he was placed on anticoagulation with enoxaparin.  By the time of discharge his heart rate is controlled and he has no further symptoms of withdrawal.   1.  Acute delirium tremens, severe alcohol withdrawal syndrome.  Patient was admitted to the intensive care unit, he was placed on dexmedetomidine infusion and had frequent neuro checks.  He did not loss his airway.  He was placed on benzodiazepines, and diazepam, along with duloxetine, and Seroquel.  His mentation improved and he was slowly weaned off continuous dexmedetomidine infusion.   Parient was successfully transferred to the telemetry ward, he was seen by physical therapy, with no follow-up recommended.  Patient was advised to avoid further alcohol consumption, psychotropic agents have been discontinued, by he time of his discharge he is not showing any signs of withdrawals.  Recommended follow-up as an outpatient within 7 days.  2.  New onset atrial fibrillation with rapid ventricular response.  Patient developed atrial fibrillation, his rate was controlled with AV blockade, metoprolol and diltiazem.  No signs of heart failure. Further work-up with echocardiography showed a normal LV systolic function, moderate LVH.  Even though he has been diagnosed with type 2 diabetes mellitus in  the past, it has been well controlled after  weight reduction.  Not on any pharmacologic therapy.  His CHA2DS2-VASc score is 1, without considering diabetes as a current medical condition.  In the setting of alcohol abuse, and frequent falls risk versus benefit patient will continue only antiplatelet therapy with aspirin.  I discussed with him this option and he agrees.  3.  Aspiration pneumonitis, present on admission.  Patient received 5 days of antibiotic therapy with ceftriaxone with good toleration.  At discharge his oximetry is 100% on room air and he has no dyspnea.  4.  Bilateral knee arthritis/chronic arthritis/osteoarthritis.  Patient had left knee arthrocentesis, fluid was inflammatory in nature but not infectious.  Patient received local injection of Marcaine and Depo-Medrol with good toleration.  Continue pain control with topical diclofenac and acetaminophen.  Initial set of blood cultures positive for coag negative staph and Corynebacterium, considered a contamination.  Follow-up blood cultures no growth.  5.  Hyponatremia/hypokalemia/hypomagnesemia.  Patient received intravenous fluids and his electrolytes were corrected with good toleration.  At discharge sodium 134, potassium 3.4, chloride 99, bicarb 25 glucose 104, BUN 7, creatinine 0.61, magnesium 1.4.  Patient received potassium chloride and magnesium sulfate before discharge.  6.  Type II that is mellitus, hemoglobin A1c 5.4.  Currently clinically resolved, patient had significant weight reduction in the past with improvement of glycemia.  7.  Hypertension.  It has remained well controlled.  8.  History of DVT/PE, September 2020, anemia chronic disease.  Repeat CT chest with chronic pulmonary embolism, no further anticoagulation at this point.  9.  History of erosive esophagitis.  Patient received pantoprazole and sucralfate with good toleration.  Discharge Diagnoses:  Principal Problem:   Delirium tremens (HCC) Active  Problems:   Type 2 diabetes mellitus with hyperlipidemia (HCC)   Essential hypertension   Alcohol use   Arthritis of both knees   Hyponatremia   Aspiration pneumonia (HCC)   Atrial fibrillation with RVR (HCC)    Discharge Instructions   Allergies as of 04/23/2020   No Known Allergies     Medication List    TAKE these medications   acetaminophen 325 MG tablet Commonly known as: TYLENOL Take 2 tablets (650 mg total) by mouth every 6 (six) hours.   aspirin 81 MG EC tablet Take 1 tablet (81 mg total) by mouth daily. Swallow whole.   diclofenac Sodium 1 % Gel Commonly known as: VOLTAREN Apply 2 g topically in the morning, at noon, and at bedtime. Apply to both knees.   diltiazem 120 MG 24 hr capsule Commonly known as: CARDIZEM CD Take 1 capsule (120 mg total) by mouth daily. First down 04/23/20 at 6:00 pm.   Metoprolol Tartrate 75 MG Tabs Take 75 mg by mouth 2 (two) times daily.       Follow-up Information    [name], NP Follow up in 1 week(s).   Specialty: Internal Medicine Contact information: [address] [place] [address] [phone]              No Known Allergies     Procedures/Studies: DG Chest 2 View  Result Date: 04/17/2020 CLINICAL DATA:  Knee pain EXAM: CHEST - 2 VIEW COMPARISON:  01/12/2020 FINDINGS: The heart size and mediastinal contours are within normal limits. Both lungs are clear. The visualized skeletal structures are unremarkable. IMPRESSION: Normal study. Electronically Signed   By: Charlett Nose M.D.   On: 04/17/2020 01:01   DG Knee 2 Views Right  Result Date: 04/10/2020 CLINICAL DATA:  Pain status post  fall EXAM: RIGHT KNEE - 1-2 VIEW COMPARISON:  None. FINDINGS: There is a large suprapatellar joint effusion. The patient is status post prior intramedullary nail placement through the femur. There are end-stage degenerative changes of the right knee. There is no definite acute displaced fracture or dislocation.  IMPRESSION: 1. No definite acute displaced fracture or dislocation. 2. Persistent large suprapatellar joint effusion. 3. There are end-stage degenerative changes of the right knee. Electronically Signed   By: Katherine Mantle M.D.   On: 04/10/2020 23:27   DG Ankle 2 Views Right  Result Date: 04/10/2020 CLINICAL DATA:  Pain EXAM: RIGHT ANKLE - 2 VIEW COMPARISON:  None. FINDINGS: There is no evidence of fracture, dislocation, or joint effusion. There is no evidence of arthropathy or other focal bone abnormality. Soft tissues are unremarkable. IMPRESSION: Negative. Electronically Signed   By: Katherine Mantle M.D.   On: 04/10/2020 23:29   CT Head Wo Contrast  Result Date: 04/17/2020 CLINICAL DATA:  Altered mental status EXAM: CT HEAD WITHOUT CONTRAST TECHNIQUE: Contiguous axial images were obtained from the base of the skull through the vertex without intravenous contrast. COMPARISON:  04/11/2020 FINDINGS: Brain: No acute intracranial abnormality. Specifically, no hemorrhage, hydrocephalus, mass lesion, acute infarction, or significant intracranial injury. Mild age advanced volume loss. Vascular: No hyperdense vessel or unexpected calcification. Skull: No acute calvarial abnormality. Sinuses/Orbits: Visualized paranasal sinuses and mastoids clear. Orbital soft tissues unremarkable. Other: None IMPRESSION: No acute intracranial abnormality. Electronically Signed   By: Charlett Nose M.D.   On: 04/17/2020 01:18   CT Head Wo Contrast  Result Date: 04/11/2020 CLINICAL DATA:  Struck posterior head on refrigerator, loss of consciousness EXAM: CT HEAD WITHOUT CONTRAST TECHNIQUE: Contiguous axial images were obtained from the base of the skull through the vertex without intravenous contrast. COMPARISON:  CT 04/07/2019 FINDINGS: Brain: No evidence of acute infarction, hemorrhage, hydrocephalus, extra-axial collection or mass lesion/mass effect. Vascular: No hyperdense vessel or unexpected calcification. Skull:  Minimal posterior midline contusive changes without large hematoma or calvarial fracture. No suspicious osseous lesions. Sinuses/Orbits: Paranasal sinuses and mastoid air cells are predominantly clear. Included orbital structures are unremarkable. Other: None IMPRESSION: 1. Minimal posterior midline contusive changes without hematoma or calvarial fracture. 2. No acute intracranial findings. Electronically Signed   By: Kreg Shropshire M.D.   On: 04/11/2020 00:27   CT Angio Chest PE W and/or Wo Contrast  Result Date: 04/17/2020 CLINICAL DATA:  Dehydrated and auditory hallucinations EXAM: CT ANGIOGRAPHY CHEST WITH CONTRAST TECHNIQUE: Multidetector CT imaging of the chest was performed using the standard protocol during bolus administration of intravenous contrast. Multiplanar CT image reconstructions and MIPs were obtained to evaluate the vascular anatomy. CONTRAST:  OMNIPAQUE IOHEXOL 350 MG/ML SOLN COMPARISON:  Multiple prior exams, most recently January 12, 2020 FINDINGS: Cardiovascular: There is slightly suboptimal opacification of the main pulmonary artery. No central or segmental pulmonary embolism is seen. There is probable residual chronic partial occlusive thrombus seen in the posterior right lower lobe subsegmental pulmonary artery, series 8, image 168. This is in the same subsegmental branch dating back to July 24, 2019 there is mild cardiomegaly. No pericardial effusion or thickening. No evidence right heart strain. There is normal three-vessel brachiocephalic anatomy without proximal stenosis. Coronary artery calcifications are seen. Mediastinum/Nodes: No hilar, mediastinal, or axillary adenopathy. Thyroid gland, trachea, and esophagus demonstrate no significant findings. Lungs/Pleura: Mildly hazy patchy airspace opacity seen at the posterior right lung base. No large airspace consolidation or pleural effusion. No pleural effusion or pneumothorax. No  airspace consolidation. Upper Abdomen: No acute  abnormalities present in the visualized portions of the upper abdomen. Musculoskeletal: No chest wall abnormality. No acute or significant osseous findings. Review of the MIP images confirms the above findings. IMPRESSION: Slightly suboptimal opacification of the main pulmonary artery. 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 These results were called by telephone at the time of interpretation on 04/17/2020 at 6:37 am to provider [facility] , who verbally acknowledged these results. Electronically Signed   By: Prudencio Pair M.D.   On: 04/17/2020 06:33   MR BRAIN WO CONTRAST  Result Date: 04/11/2020 CLINICAL DATA:  Ataxia. Stroke. Bilateral leg pain. Right chest pain. Head trauma with loss of consciousness. EXAM: MRI HEAD WITHOUT CONTRAST TECHNIQUE: Multiplanar, multiecho pulse sequences of the brain and surrounding structures were obtained without intravenous contrast. COMPARISON:  Head CT same day FINDINGS: Brain: Diffusion imaging does not show any acute or subacute infarction. There is generalized brain volume loss, advanced for age. No evidence of old or acute focal small or large vessel infarction. No mass lesion, hemorrhage, hydrocephalus or extra-axial collection. Vascular: Major vessels at the base of the brain show flow. Skull and upper cervical spine: Negative Sinuses/Orbits: Clear/normal Other: None IMPRESSION: No acute or traumatic finding. Generalized brain volume loss, premature for age. No evidence of small or large vessel insult. Electronically Signed   By: Nelson Chimes M.D.   On: 04/11/2020 10:19   DG Chest Port 1 View  Result Date: 04/20/2020 CLINICAL DATA:  Aspiration pneumonia. EXAM: PORTABLE CHEST 1 VIEW COMPARISON:  Radiographs 04/17/2020  and 01/12/2020.  CT 04/17/2020. FINDINGS: 0416 hours. Mild patient rotation to the right and low lung volumes. The heart size and mediastinal contours are normal. The lungs are clear. There is no pleural effusion or pneumothorax. No acute osseous findings are identified. IMPRESSION: Stable chest.  No active cardiopulmonary process. Electronically Signed   By: Richardean Sale M.D.   On: 04/20/2020 08:07   DG Knee Complete 4 Views Left  Result Date: 04/17/2020 CLINICAL DATA:  Knee pain EXAM: LEFT KNEE - COMPLETE 4+ VIEW COMPARISON:  11/10/2018 FINDINGS: Moderate tricompartment degenerative changes within the left knee with joint space narrowing and spurring. Large joint effusion. No acute fracture, subluxation or dislocation. Old healed proximal fibular shaft fracture. 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 IMPRESSION: No acute bony abnormality. Moderate tricompartment degenerative changes. Large joint effusion. Electronically Signed   By: Rolm Baptise M.D.   On: 04/17/2020 01:01   ECHOCARDIOGRAM COMPLETE  Result Date: 04/22/2020    ECHOCARDIOGRAM REPORT   Patient Name:   COLEMAN KALAS Date of Exam: 04/22/2020 Medical Rec #:  [id number]     Height:       78.0 in Accession #:    [id number]    Weight:       215.0 lb Date of Birth:  06/02/1976      BSA:          2.328 m Patient Age:    43 years      BP:           135/91 mmHg Patient Gender: M             HR:  113 bpm. Exam Location:  Inpatient Procedure: 2D Echo and Intracardiac Opacification Agent Indications:    Atrial Fibrillation 427.31 / I48.91  History:        Patient has no prior history of Echocardiogram examinations.                 Risk Factors:Diabetes, Hypertension and Dyslipidemia. Aspiration                 pneumonia, Alcohol use, Hx of DVT and PE in 09/20/ anemia of                 chronic disease.  Sonographer:    Leta Jungling RDCS Referring Phys: [id number] Alajah Witman DANIEL  Eziah Negro IMPRESSIONS  1. Left ventricular ejection fraction, by estimation, is 55 to 60%. The left ventricle has normal function. The left ventricle has no regional wall motion abnormalities. There is moderate concentric left ventricular hypertrophy. Left ventricular diastolic parameters are indeterminate.  2. Right ventricular systolic function is normal. The right ventricular size is normal. Tricuspid regurgitation signal is inadequate for assessing PA pressure.  3. Left atrial size was severely dilated.  4. The mitral valve is normal in structure. Trivial mitral valve regurgitation. No evidence of mitral stenosis.  5. The aortic valve is tricuspid. Aortic valve regurgitation is not visualized. Mild aortic valve sclerosis is present, with no evidence of aortic valve stenosis.  6. The inferior vena cava is dilated in size with <50% respiratory variability, suggesting right atrial pressure of 15 mmHg. FINDINGS  Left Ventricle: Left ventricular ejection fraction, by estimation, is 55 to 60%. The left ventricle has normal function. The left ventricle has no regional wall motion abnormalities. Definity contrast agent was given IV to delineate the left ventricular  endocardial borders. The left ventricular internal cavity size was normal in size. There is moderate concentric left ventricular hypertrophy. Left ventricular diastolic parameters are indeterminate. Right Ventricle: The right ventricular size is normal. No increase in right ventricular wall thickness. Right ventricular systolic function is normal. Tricuspid regurgitation signal is inadequate for assessing PA pressure. Left Atrium: Left atrial size was severely dilated. Right Atrium: Right atrial size was normal in size. Pericardium: There is no evidence of pericardial effusion. Mitral Valve: The mitral valve is normal in structure. There is mild thickening of the mitral valve leaflet(s). Normal mobility of the mitral valve leaflets. Trivial mitral valve  regurgitation. No evidence of mitral valve stenosis. Tricuspid Valve: The tricuspid valve is normal in structure. Tricuspid valve regurgitation is trivial. No evidence of tricuspid stenosis. Aortic Valve: The aortic valve is tricuspid. Aortic valve regurgitation is not visualized. Mild aortic valve sclerosis is present, with no evidence of aortic valve stenosis. Pulmonic Valve: The pulmonic valve was normal in structure. Pulmonic valve regurgitation is trivial. No evidence of pulmonic stenosis. Aorta: The aortic root is normal in size and structure. Venous: The inferior vena cava is dilated in size with less than 50% respiratory variability, suggesting right atrial pressure of 15 mmHg. IAS/Shunts: No atrial level shunt detected by color flow Doppler.  LEFT VENTRICLE PLAX 2D LVIDd:         5.00 cm LVIDs:         4.20 cm LV PW:         1.40 cm LV IVS:        1.30 cm LVOT diam:     2.50 cm LV SV:         71 LV SV Index:   31 LVOT Area:  4.91 cm  LV Volumes (MOD) LV vol d, MOD A2C: 237.0 ml LV vol d, MOD A4C: 206.0 ml LV vol s, MOD A2C: 86.7 ml LV vol s, MOD A4C: 113.0 ml LV SV MOD A2C:     150.3 ml LV SV MOD A4C:     206.0 ml LV SV MOD BP:      121.9 ml RIGHT VENTRICLE TAPSE (M-mode): 1.7 cm LEFT ATRIUM              Index       RIGHT ATRIUM           Index LA diam:        4.30 cm  1.85 cm/m  RA Area:     17.70 cm LA Vol (A2C):   106.0 ml 45.53 ml/m RA Volume:   50.30 ml  21.61 ml/m LA Vol (A4C):   108.0 ml 46.39 ml/m LA Biplane Vol: 110.0 ml 47.25 ml/m  AORTIC VALVE LVOT Vmax:   99.80 cm/s LVOT Vmean:  66.800 cm/s LVOT VTI:    0.145 m  AORTA Ao Root diam: 3.40 cm  SHUNTS Systemic VTI:  0.14 m Systemic Diam: 2.50 cm Armanda Magic MD Electronically signed by Armanda Magic MD Signature Date/Time: 04/22/2020/1:14:42 PM    Final    DG Hip Unilat W or Wo Pelvis 1 View Right  Result Date: 04/10/2020 CLINICAL DATA:  Pain status post fall. EXAM: DG HIP (WITH OR WITHOUT PELVIS) 1V RIGHT COMPARISON:  April 10, 2019  FINDINGS: Patient has undergone intramedullary nail placement. The hardware appears grossly intact. There is a healed fracture of the proximal right femur. There are degenerative changes of the hips. There is no new acute displaced fracture. IMPRESSION: No acute displaced fracture or dislocation. Electronically Signed   By: Katherine Mantle M.D.   On: 04/10/2020 23:28   VAS [place] LOWER EXTREMITY VENOUS (DVT)  Result Date: 04/17/2020  Lower Venous DVTStudy Indications: Edema.  Limitations: Patient cooperation. Comparison Study: 08/01/19 DVT rt gastroc, Pop, PTV Performing Technologist: Jeb Levering RDMS, RVT  Examination Guidelines: A complete evaluation includes B-mode imaging, spectral Doppler, color Doppler, and power Doppler as needed of all accessible portions of each vessel. Bilateral testing is considered an integral part of a complete examination. Limited examinations for reoccurring indications may be performed as noted. The reflux portion of the exam is performed with the patient in reverse Trendelenburg.  +---------+---------------+---------+-----------+----------+--------------+  RIGHT     Compressibility Phasicity Spontaneity Properties Thrombus Aging  +---------+---------------+---------+-----------+----------+--------------+  CFV       Full            Yes       Yes                                    +---------+---------------+---------+-----------+----------+--------------+  SFJ       Full                                                             +---------+---------------+---------+-----------+----------+--------------+  FV Prox   Full                                                             +---------+---------------+---------+-----------+----------+--------------+  FV Mid    Full                                                             +---------+---------------+---------+-----------+----------+--------------+  FV Distal Full                                                              +---------+---------------+---------+-----------+----------+--------------+  PFV       Full                                                             +---------+---------------+---------+-----------+----------+--------------+  POP       Full            Yes       Yes                                    +---------+---------------+---------+-----------+----------+--------------+  PTV       Full                                                             +---------+---------------+---------+-----------+----------+--------------+  PERO      Full                                                             +---------+---------------+---------+-----------+----------+--------------+   +---------+---------------+---------+-----------+----------+--------------+  LEFT      Compressibility Phasicity Spontaneity Properties Thrombus Aging  +---------+---------------+---------+-----------+----------+--------------+  CFV       Full            Yes       Yes                                    +---------+---------------+---------+-----------+----------+--------------+  SFJ       Full                                                             +---------+---------------+---------+-----------+----------+--------------+  FV Prox   Full                                                             +---------+---------------+---------+-----------+----------+--------------+  FV Mid    Full                                                             +---------+---------------+---------+-----------+----------+--------------+  FV Distal Full                                                             +---------+---------------+---------+-----------+----------+--------------+  PFV       Full                                                             +---------+---------------+---------+-----------+----------+--------------+  POP       Full            Yes       Yes                                     +---------+---------------+---------+-----------+----------+--------------+  PTV       Full                                                             +---------+---------------+---------+-----------+----------+--------------+  PERO      Full                                                             +---------+---------------+---------+-----------+----------+--------------+     Summary: BILATERAL: - No evidence of deep vein thrombosis seen in the lower extremities, bilaterally. -No evidence of popliteal cyst, bilaterally.   *See table(s) above for measurements and observations. Electronically signed by Waverly Ferrari MD on 04/17/2020 at 3:53:45 PM.    Final       Procedures: left knee arthorcentesis  Subjective: Patient is feeling better, back to his baseline, no nausea or vomiting, no chest pain or dyspnea. Has been ambulating with physical thearpy.   Discharge Exam: Vitals:   04/23/20 0527 04/23/20 1305  BP: 120/81 (!) 131/93  Pulse: 73 72  Resp: 18 17  Temp: 98.1 F (36.7 C) 98.3 F (36.8 C)  SpO2: 100% 100%   Vitals:   04/22/20 2111 04/23/20 0144 04/23/20 0527 04/23/20 1305  BP: 110/73 121/81 120/81 (!) 131/93  Pulse: 81 73 73 72  Resp: 18 17 18 17   Temp: 97.9 F (36.6 C) 98.3 F (36.8 C) 98.1 F (36.7 C) 98.3 F (36.8 C)  TempSrc: Oral Oral Oral Oral  SpO2: 100% 96% 100% 100%  Weight:      Height:  General: Not in pain or dyspnea.  Neurology: Awake and alert, non focal  E ENT: no pallor, no icterus, oral mucosa moist Cardiovascular: No JVD. S1-S2 present, irregularly irregular, no gallops, rubs, or murmurs. No lower extremity edema. Pulmonary: positive breath sounds bilaterally, adequate air movement, no wheezing, rhonchi or rales. Gastrointestinal. Abdomen with no organomegaly, non tender, no rebound or guarding Skin. No rashes Musculoskeletal: no joint deformities   The results of significant diagnostics from this hospitalization (including imaging,  microbiology, ancillary and laboratory) are listed below for reference.     Microbiology: Recent Results (from the past 240 hour(s))  Blood culture (routine x 2)     Status: Abnormal (Preliminary result)   Collection Time: 04/17/20  2:30 AM   Specimen: BLOOD  Result Value Ref Range Status   Specimen Description   Final    BLOOD BLOOD RIGHT HAND Performed at [facility], [address], [place], [place] [address]    Special Requests   Final    BOTTLES DRAWN AEROBIC AND ANAEROBIC Blood Culture adequate volume Performed at [facility], [address], [place], [place] [address]    Culture  Setup Time   Final    AEROBIC BOTTLE ONLY GRAM POSITIVE RODS CRITICAL RESULT CALLED TO, READ BACK BY AND VERIFIED WITH: Peggyann Juba [facility] 04/19/20 0151 JDW    Culture (A)  Final    CORYNEBACTERIUM, GROUP JK Standardized susceptibility testing for this organism is not available. CULTURE REINCUBATED FOR BETTER GROWTH Performed at [facility], [address] [place], [place] [address]    Report Status PENDING  Incomplete  Blood culture (routine x 2)     Status: Abnormal   Collection Time: 04/17/20  2:30 AM   Specimen: BLOOD  Result Value Ref Range Status   Specimen Description   Final    BLOOD LEFT ANTECUBITAL Performed at [facility], [address] [place], [place] [address]    Special Requests   Final    BOTTLES DRAWN AEROBIC AND ANAEROBIC Blood Culture adequate volume Performed at [facility], [address] [place], [place] [address]    Culture  Setup Time   Final    AEROBIC BOTTLE ONLY GRAM POSITIVE COCCI IN CLUSTERS CRITICAL RESULT CALLED TO, READ BACK BY AND VERIFIED WITH: Peggyann Juba PHARM @ [phone] ON 04/18/20    Culture (A)  Final    STAPHYLOCOCCUS SPECIES (COAGULASE NEGATIVE) THE SIGNIFICANCE OF ISOLATING THIS ORGANISM FROM A SINGLE SET OF BLOOD CULTURES WHEN MULTIPLE SETS ARE DRAWN IS UNCERTAIN.  PLEASE NOTIFY THE MICROBIOLOGY DEPARTMENT WITHIN ONE WEEK IF SPECIATION AND SENSITIVITIES ARE REQUIRED. Performed at [facility], [address] [place], [place] [address]    Report Status 04/20/2020 FINAL  Final  Body fluid culture     Status: None   Collection Time: 04/17/20  2:30 AM   Specimen: Synovium; Body Fluid  Result Value Ref Range Status   Specimen Description   Final    SYNOVIAL FLUID KNEE Performed at [facility], [address], [place], [place] [address]    Special Requests   Final    NONE Performed at [facility], [address], [place], [place] [address]    Culture   Final    NO GROWTH 3 DAYS Performed at [facility], [address] [place], [place] [address]    Report Status 04/20/2020 FINAL  Final  Gram stain  Status: None   Collection Time: 04/17/20  2:30 AM   Specimen: Synovium; Body Fluid  Result Value Ref Range Status   Specimen Description   Final    SYNOVIAL Performed at [facility], [address], [place], [place] [address]    Special Requests   Final    NONE Performed at [facility], [address], [place], [place] [address]    Gram Stain   Final    FEW WBC PRESENT, PREDOMINANTLY PMN NO ORGANISMS SEEN CORRECTED ON 06/15 AT 1046: PREVIOUSLY REPORTED AS RARE GRAM POSITIVE COCCI CORRECTED RESULTS CALLED TO: DR VAN DAM AT 1045 04/17/20 BY L BENFIELD Performed at [facility], [address] [place], [place] [address]    Report Status 04/17/2020 FINAL  Final  Blood Culture ID Panel (Reflexed)     Status: Abnormal   Collection Time: 04/17/20  2:30 AM  Result Value Ref Range Status   Enterococcus species NOT DETECTED NOT DETECTED Final   Listeria monocytogenes NOT DETECTED NOT DETECTED Final   Staphylococcus species DETECTED (A) NOT DETECTED Final    Comment: Methicillin (oxacillin) resistant coagulase negative staphylococcus. Possible blood culture  contaminant (unless isolated from more than one blood culture draw or clinical case suggests pathogenicity). No antibiotic treatment is indicated for blood  culture contaminants. CRITICAL RESULT CALLED TO, READ BACK BY AND VERIFIED WITH: Peggyann Juba PHARM @ [phone] ON 04/18/20    Staphylococcus aureus (BCID) NOT DETECTED NOT DETECTED Final   Methicillin resistance DETECTED (A) NOT DETECTED Final    Comment: CRITICAL RESULT CALLED TO, READ BACK BY AND VERIFIED WITH: Peggyann Juba PHARM @ [phone] ON 04/18/20    Streptococcus species NOT DETECTED NOT DETECTED Final   Streptococcus agalactiae NOT DETECTED NOT DETECTED Final   Streptococcus pneumoniae NOT DETECTED NOT DETECTED Final   Streptococcus pyogenes NOT DETECTED NOT DETECTED Final   Acinetobacter baumannii NOT DETECTED NOT DETECTED Final   Enterobacteriaceae species NOT DETECTED NOT DETECTED Final   Enterobacter cloacae complex NOT DETECTED NOT DETECTED Final   Escherichia coli NOT DETECTED NOT DETECTED Final   Klebsiella oxytoca NOT DETECTED NOT DETECTED Final   Klebsiella pneumoniae NOT DETECTED NOT DETECTED Final   Proteus species NOT DETECTED NOT DETECTED Final   Serratia marcescens NOT DETECTED NOT DETECTED Final   Haemophilus influenzae NOT DETECTED NOT DETECTED Final   Neisseria meningitidis NOT DETECTED NOT DETECTED Final   Pseudomonas aeruginosa NOT DETECTED NOT DETECTED Final   Candida albicans NOT DETECTED NOT DETECTED Final   Candida glabrata NOT DETECTED NOT DETECTED Final   Candida krusei NOT DETECTED NOT DETECTED Final   Candida parapsilosis NOT DETECTED NOT DETECTED Final   Candida tropicalis NOT DETECTED NOT DETECTED Final    Comment: Performed at [facility], [address], [place], [place] [address]  SARS Coronavirus 2 by RT PCR (hospital order, performed in [facility] hospital lab) Nasopharyngeal Nasopharyngeal Swab     Status: None   Collection Time: 04/17/20  6:44 AM   Specimen: Nasopharyngeal Swab  Result  Value Ref Range Status   SARS Coronavirus 2 NEGATIVE NEGATIVE Final    Comment: (NOTE) SARS-CoV-2 target nucleic acids are NOT DETECTED.  The SARS-CoV-2 RNA is generally detectable in upper and lower respiratory specimens during the acute phase of infection. The lowest concentration of SARS-CoV-2 viral copies this assay can detect is 250 copies / mL. A negative result does not preclude SARS-CoV-2 infection and should not be used as the  sole basis for treatment or other patient management decisions.  A negative result may occur with improper specimen collection / handling, submission of specimen other than nasopharyngeal swab, presence of viral mutation(s) within the areas targeted by this assay, and inadequate number of viral copies (<250 copies / mL). A negative result must be combined with clinical observations, patient history, and epidemiological information.  Fact Sheet for Patients:   BoilerBrush.com.cy  Fact Sheet for Healthcare Providers: https://pope.com/  This test is not yet approved or  cleared by the [place] FDA and has been authorized for detection and/or diagnosis of SARS-CoV-2 by FDA under an Emergency Use Authorization (EUA).  This EUA will remain in effect (meaning this test can be used) for the duration of the COVID-19 declaration under Section 564(b)(1) of the Act, 21 U.S.C. section 360bbb-3(b)(1), unless the authorization is terminated or revoked sooner.  Performed at [facility], [address] [place], [place] [address]   MRSA PCR Screening     Status: None   Collection Time: 04/17/20  8:53 AM   Specimen: Nasal Mucosa; Nasopharyngeal  Result Value Ref Range Status   MRSA by PCR NEGATIVE NEGATIVE Final    Comment:        The GeneXpert MRSA Assay (FDA approved for NASAL specimens only), is one component of a comprehensive MRSA colonization surveillance program. It is  not intended to diagnose MRSA infection nor to guide or monitor treatment for MRSA infections. Performed at [facility], [address], [place], [place] [address]   Culture, blood (routine x 2)     Status: None (Preliminary result)   Collection Time: 04/21/20 10:56 AM   Specimen: BLOOD  Result Value Ref Range Status   Specimen Description   Final    BLOOD RIGHT ARM Performed at [facility], [address], [place], [place] [address]    Special Requests   Final    BOTTLES DRAWN AEROBIC AND ANAEROBIC Blood Culture adequate volume Performed at [facility], [address] [place], [place] [address]    Culture   Final    NO GROWTH 2 DAYS Performed at [facility], [address], [place], [place] [address]    Report Status PENDING  Incomplete  Culture, blood (routine x 2)     Status: None (Preliminary result)   Collection Time: 04/21/20 10:56 AM   Specimen: BLOOD  Result Value Ref Range Status   Specimen Description   Final    BLOOD RIGHT HAND Performed at [facility], [address], [place], [place] [address]    Special Requests   Final    BOTTLES DRAWN AEROBIC AND ANAEROBIC Blood Culture adequate volume Performed at [facility], [address], [place], [place] [address]    Culture   Final    NO GROWTH 2 DAYS Performed at [facility], [address], [place], [place] [address]    Report Status PENDING  Incomplete     Labs: BNP (last 3 results) No results for input(s): BNP in the last 8760 hours. Basic Metabolic Panel: Recent Labs  Lab 04/17/20 0810 04/17/20 2004 04/19/20 0215 04/20/20 0554 04/21/20 0315 04/22/20 0256 04/23/20 0336  NA 133*   < > 132* 137 137 134* 134*  K 2.6*   < > 3.7 4.0 3.4* 3.6 3.4*  CL 98   < > 100 109 103 102 99  CO2 22   < > 21* 22  GLUCOSE 125*   < > 213* 157* 144* 121* 104*  BUN 12   < > CREATININE 0.61   < > 0.56* 0.56* 0.58* 0.64 0.61  CALCIUM 6.9*   < > 8.0* 7.9* 8.0* 7.9* 7.8*  MG 1.6*   < > 1.5* 1.6* 1.4* 1.4* 1.4*  PHOS 3.7  --   --   --   --   --   --    < > = values in this interval not displayed.   Liver Function Tests: Recent Labs  Lab 04/16/20 2315 04/17/20 0810 04/21/20 0315  AST 87* 62* 41  ALT 45* 37 43  ALKPHOS 60 51 56  BILITOT 1.1 1.2 0.5  PROT 7.4 6.2* 6.3*  ALBUMIN 2.9* 2.4* 2.6*   No results for input(s): LIPASE, AMYLASE in the last 168 hours. Recent Labs  Lab 04/17/20 0416  AMMONIA 29   CBC: Recent Labs  Lab 04/18/20 0222 04/19/20 0215 04/20/20 0554 04/21/20 0315 04/22/20 0256  WBC 4.7 5.0 6.2 8.4 7.4  NEUTROABS  --   --   --   --  5.1  HGB 9.2* 10.0* 10.2* 10.0* 9.7*  HCT 26.3* 29.3* 30.0* 30.1* 29.3*  MCV 89.8 91.6 90.6 92.0 91.6  PLT 312 402* 518* 595* 586*   Cardiac Enzymes: Recent Labs  Lab 04/16/20 2315  CKTOTAL 328   BNP: Invalid input(s): POCBNP CBG: Recent Labs  Lab 04/22/20 1728 04/22/20 2110 04/23/20 0142 04/23/20 0725 04/23/20 1132  GLUCAP 123* 107* 125* 90 98   D-Dimer No results for input(s): DDIMER in the last 72 hours. Hgb A1c No results for input(s): HGBA1C in the last 72 hours. Lipid Profile No results for input(s): CHOL, HDL, LDLCALC, TRIG, CHOLHDL, LDLDIRECT in the last 72 hours. Thyroid function studies No results for input(s): TSH, T4TOTAL, T3FREE, THYROIDAB in the last 72 hours.  Invalid input(s): FREET3 Anemia work up No results for input(s): VITAMINB12, FOLATE, FERRITIN, TIBC, IRON, RETICCTPCT in the last 72 hours. Urinalysis    Component Value Date/Time   COLORURINE AMBER (A) 04/17/2020 0904   APPEARANCEUR CLEAR 04/17/2020 0904   LABSPEC 1.016 04/17/2020 0904   PHURINE 6.0 04/17/2020 0904   GLUCOSEU NEGATIVE 04/17/2020 0904   HGBUR NEGATIVE 04/17/2020 0904   BILIRUBINUR NEGATIVE 04/17/2020 0904   KETONESUR 5 (A) 04/17/2020 0904   PROTEINUR NEGATIVE 04/17/2020 0904    UROBILINOGEN 1.0 07/29/2014 1837   NITRITE NEGATIVE 04/17/2020 0904   LEUKOCYTESUR TRACE (A) 04/17/2020 0904   Sepsis Labs Invalid input(s): PROCALCITONIN,  WBC,  LACTICIDVEN Microbiology Recent Results (from the past 240 hour(s))  Blood culture (routine x 2)     Status: Abnormal (Preliminary result)   Collection Time: 04/17/20  2:30 AM   Specimen: BLOOD  Result Value Ref Range Status   Specimen Description   Final    BLOOD BLOOD RIGHT HAND Performed at [facility], [address] [place], [place] [address]    Special Requests   Final    BOTTLES DRAWN AEROBIC AND ANAEROBIC Blood Culture adequate volume Performed at [facility], [address] [place], [place] [address]    Culture  Setup Time   Final    AEROBIC BOTTLE ONLY GRAM POSITIVE RODS CRITICAL RESULT CALLED TO, READ BACK BY AND VERIFIED WITH: Peggyann Juba [facility] 04/19/20 0151 JDW    Culture (A)  Final    CORYNEBACTERIUM, GROUP JK Standardized susceptibility testing for this organism is  not available. CULTURE REINCUBATED FOR BETTER GROWTH Performed at [facility], [address] [place], [place] [address]    Report Status PENDING  Incomplete  Blood culture (routine x 2)     Status: Abnormal   Collection Time: 04/17/20  2:30 AM   Specimen: BLOOD  Result Value Ref Range Status   Specimen Description   Final    BLOOD LEFT ANTECUBITAL Performed at [facility], [address] [place] [address] [place], [place] [address]    Special Requests   Final    BOTTLES DRAWN AEROBIC AND ANAEROBIC Blood Culture adequate volume Performed at [facility], [address], [place], [place] [address]    Culture  Setup Time   Final    AEROBIC BOTTLE ONLY GRAM POSITIVE COCCI IN CLUSTERS CRITICAL RESULT CALLED TO, READ BACK BY AND VERIFIED WITH: Peggyann Juba PHARM @ [phone] ON 04/18/20    Culture (A)  Final    STAPHYLOCOCCUS SPECIES (COAGULASE NEGATIVE) THE SIGNIFICANCE OF  ISOLATING THIS ORGANISM FROM A SINGLE SET OF BLOOD CULTURES WHEN MULTIPLE SETS ARE DRAWN IS UNCERTAIN. PLEASE NOTIFY THE MICROBIOLOGY DEPARTMENT WITHIN ONE WEEK IF SPECIATION AND SENSITIVITIES ARE REQUIRED. Performed at [facility], [address] [place], [place] [address]    Report Status 04/20/2020 FINAL  Final  Body fluid culture     Status: None   Collection Time: 04/17/20  2:30 AM   Specimen: Synovium; Body Fluid  Result Value Ref Range Status   Specimen Description   Final    SYNOVIAL FLUID KNEE Performed at [facility], [address], [place], [place] [address]    Special Requests   Final    NONE Performed at [facility], [address], [place], [place] [address]    Culture   Final    NO GROWTH 3 DAYS Performed at [facility], [address] [place], [place] [address]    Report Status 04/20/2020 FINAL  Final  Gram stain     Status: None   Collection Time: 04/17/20  2:30 AM   Specimen: Synovium; Body Fluid  Result Value Ref Range Status   Specimen Description   Final    SYNOVIAL Performed at [facility], [address], [place], [place] [address]    Special Requests   Final    NONE Performed at [facility], [address] [place], [place] [address]    Gram Stain   Final    FEW WBC PRESENT, PREDOMINANTLY PMN NO ORGANISMS SEEN CORRECTED ON 06/15 AT 1046: PREVIOUSLY REPORTED AS RARE GRAM POSITIVE COCCI CORRECTED RESULTS CALLED TO: DR VAN DAM AT 1045 04/17/20 BY L BENFIELD Performed at [facility], [address] [place], [place] [address]    Report Status 04/17/2020 FINAL  Final  Blood Culture ID Panel (Reflexed)     Status: Abnormal   Collection Time: 04/17/20  2:30 AM  Result Value Ref Range Status   Enterococcus species NOT DETECTED NOT DETECTED Final   Listeria monocytogenes NOT DETECTED NOT DETECTED Final   Staphylococcus species DETECTED (A) NOT DETECTED Final     Comment: Methicillin (oxacillin) resistant coagulase negative staphylococcus. Possible blood culture contaminant (unless isolated from more than one blood culture draw or clinical case suggests pathogenicity). No antibiotic treatment is indicated for blood  culture contaminants. CRITICAL RESULT CALLED TO, READ BACK BY AND VERIFIED WITH: Peggyann Juba PHARM @ [phone] ON 04/18/20    Staphylococcus  aureus (BCID) NOT DETECTED NOT DETECTED Final   Methicillin resistance DETECTED (A) NOT DETECTED Final    Comment: CRITICAL RESULT CALLED TO, READ BACK BY AND VERIFIED WITH: Peggyann Juba PHARM @ [phone] ON 04/18/20    Streptococcus species NOT DETECTED NOT DETECTED Final   Streptococcus agalactiae NOT DETECTED NOT DETECTED Final   Streptococcus pneumoniae NOT DETECTED NOT DETECTED Final   Streptococcus pyogenes NOT DETECTED NOT DETECTED Final   Acinetobacter baumannii NOT DETECTED NOT DETECTED Final   Enterobacteriaceae species NOT DETECTED NOT DETECTED Final   Enterobacter cloacae complex NOT DETECTED NOT DETECTED Final   Escherichia coli NOT DETECTED NOT DETECTED Final   Klebsiella oxytoca NOT DETECTED NOT DETECTED Final   Klebsiella pneumoniae NOT DETECTED NOT DETECTED Final   Proteus species NOT DETECTED NOT DETECTED Final   Serratia marcescens NOT DETECTED NOT DETECTED Final   Haemophilus influenzae NOT DETECTED NOT DETECTED Final   Neisseria meningitidis NOT DETECTED NOT DETECTED Final   Pseudomonas aeruginosa NOT DETECTED NOT DETECTED Final   Candida albicans NOT DETECTED NOT DETECTED Final   Candida glabrata NOT DETECTED NOT DETECTED Final   Candida krusei NOT DETECTED NOT DETECTED Final   Candida parapsilosis NOT DETECTED NOT DETECTED Final   Candida tropicalis NOT DETECTED NOT DETECTED Final    Comment: Performed at [facility], [address], [place], [place] [address]  SARS Coronavirus 2 by RT PCR (hospital order, performed in [facility] hospital lab) Nasopharyngeal Nasopharyngeal  Swab     Status: None   Collection Time: 04/17/20  6:44 AM   Specimen: Nasopharyngeal Swab  Result Value Ref Range Status   SARS Coronavirus 2 NEGATIVE NEGATIVE Final    Comment: (NOTE) SARS-CoV-2 target nucleic acids are NOT DETECTED.  The SARS-CoV-2 RNA is generally detectable in upper and lower respiratory specimens during the acute phase of infection. The lowest concentration of SARS-CoV-2 viral copies this assay can detect is 250 copies / mL. A negative result does not preclude SARS-CoV-2 infection and should not be used as the sole basis for treatment or other patient management decisions.  A negative result may occur with improper specimen collection / handling, submission of specimen other than nasopharyngeal swab, presence of viral mutation(s) within the areas targeted by this assay, and inadequate number of viral copies (<250 copies / mL). A negative result must be combined with clinical observations, patient history, and epidemiological information.  Fact Sheet for Patients:   BoilerBrush.com.cy  Fact Sheet for Healthcare Providers: https://pope.com/  This test is not yet approved or  cleared by the [place] FDA and has been authorized for detection and/or diagnosis of SARS-CoV-2 by FDA under an Emergency Use Authorization (EUA).  This EUA will remain in effect (meaning this test can be used) for the duration of the COVID-19 declaration under Section 564(b)(1) of the Act, 21 U.S.C. section 360bbb-3(b)(1), unless the authorization is terminated or revoked sooner.  Performed at [facility], [address], [place], [place] [address]   MRSA PCR Screening     Status: None   Collection Time: 04/17/20  8:53 AM   Specimen: Nasal Mucosa; Nasopharyngeal  Result Value Ref Range Status   MRSA by PCR NEGATIVE NEGATIVE Final    Comment:        The GeneXpert MRSA Assay (FDA approved for NASAL  specimens only), is one component of a comprehensive MRSA colonization surveillance program. It is not intended to diagnose MRSA infection nor to guide or monitor treatment for MRSA  infections. Performed at [facility], [address], [place], [place] [address]   Culture, blood (routine x 2)     Status: None (Preliminary result)   Collection Time: 04/21/20 10:56 AM   Specimen: BLOOD  Result Value Ref Range Status   Specimen Description   Final    BLOOD RIGHT ARM Performed at [facility], [address] [place], [place] [address]    Special Requests   Final    BOTTLES DRAWN AEROBIC AND ANAEROBIC Blood Culture adequate volume Performed at [facility], [address], [place], [place] [address]    Culture   Final    NO GROWTH 2 DAYS Performed at [facility], [address], [place], [place] [address]    Report Status PENDING  Incomplete  Culture, blood (routine x 2)     Status: None (Preliminary result)   Collection Time: 04/21/20 10:56 AM   Specimen: BLOOD  Result Value Ref Range Status   Specimen Description   Final    BLOOD RIGHT HAND Performed at [facility], [address], [place], [place] [address]    Special Requests   Final    BOTTLES DRAWN AEROBIC AND ANAEROBIC Blood Culture adequate volume Performed at [facility], [address] [place], [place] [address]    Culture   Final    NO GROWTH 2 DAYS Performed at [facility], [address], [place], [place] [address]    Report Status PENDING  Incomplete     Time coordinating discharge: 45 minutes  SIGNED:   Coralie Keens, MD  [facility] 04/23/2020, 1:46 PM

## 2020-04-23 NOTE — TOC Progression Note (Signed)
Transition of Care [facility]) - Progression Note    Patient Details  Name: MAKHARI DOVIDIO MRN: [id number] Date of Birth: 1976-02-21  Transition of Care [facility]) CM/SW Contact  Armanda Heritage, RN Phone Number: 04/23/2020, 2:17 PM  Clinical Narrative:   CM confirmed with MD, patient will discharge home on aspirin, no anticoagulation consult needed.       Expected Discharge Plan: Home/Self Care Barriers to Discharge: Continued Medical Work up  Expected Discharge Plan and Services Expected Discharge Plan: Home/Self Care   Discharge Planning Services: CM Consult   Living arrangements for the past 2 months: Single Family Home Expected Discharge Date: 04/23/20                                     Social Determinants of Health (SDOH) Interventions    Readmission Risk Interventions Readmission Risk Prevention Plan 05/19/2019  Transportation Screening Complete  Medication Review [employer]) Complete  PCP or Specialist appointment within 3-5 days of discharge Complete  HRI or Home Care Consult Complete  SW Recovery Care/Counseling Consult Complete  Palliative Care Screening Not Applicable  Skilled Nursing Facility Not Applicable  Some recent data might be hidden

## 2020-04-23 NOTE — Progress Notes (Signed)
ANTICOAGULATION CONSULT NOTE -   Pharmacy Consult for Full Dose Lovenox Indication: atrial fibrillation  No Known Allergies  Patient Measurements: Height: 6\' 6"  (198.1 cm) Weight: 97.5 kg (215 lb) IBW/kg (Calculated) : 91.4  Vital Signs: Temp: 98.3 F (36.8 C) (06/21 1305) Temp Source: Oral (06/21 1305) BP: 131/93 (06/21 1305) Pulse Rate: 72 (06/21 1305)  Labs: Recent Labs    04/21/20 0315 04/22/20 0256 04/23/20 0336  HGB 10.0* 9.7*  --   HCT 30.1* 29.3*  --   PLT 595* 586*  --   CREATININE 0.58* 0.64 0.61    Estimated Creatinine Clearance: 155.5 mL/min (by C-G formula based on SCr of 0.61 mg/dL).   Medical History: Past Medical History:  Diagnosis Date  . Anxiety   . 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 Depression   . Diabetes mellitus    diet controlled  . Essential hypertension   . Gastritis   . Gout   . Normocytic anemia due to blood loss 08/21/2019  . Pulmonary embolism (HCC)     Medications:  Scheduled:  . acetaminophen  650 mg Oral Q6H  . aspirin EC  81 mg Oral Daily  . chlorhexidine  15 mL Mouth Rinse BID  . diazepam  2 mg Oral Q8H  . diclofenac Sodium  2 g Topical QID  . diltiazem  120 mg Oral Daily  . DULoxetine  20 mg Oral Daily  . enoxaparin (LOVENOX) injection  100 mg Subcutaneous Q12H  . folic acid  1 mg Oral Daily  . insulin aspart  0-15 Units Subcutaneous TID WC  . insulin aspart  0-5 Units Subcutaneous QHS  . mouth rinse  15 mL Mouth Rinse q12n4p  . metoprolol tartrate  75 mg Oral BID  . multivitamin with minerals  1 tablet Oral Daily  . pantoprazole  40 mg Oral Daily  . QUEtiapine  50 mg Oral BID  . sodium chloride flush  10-40 mL Intracatheter Q12H  . sucralfate  1 g Oral TID WC & HS  . thiamine  100 mg Oral Daily   Infusions:  . sodium chloride Stopped (04/22/20 0006)    Assessment: 44 yo male presented to ED with CC dehydrated and hallucinating, hx Etoh abuse to start full dose Lovenox for Afib. Patient received  Lovenox 40mg  at 0800 this AM. No reported bleeding  04/23/2020 Hgb low but stable Plts elevated at 586 Scr WNL No bleeding reported   Goal of Therapy:  Heparin level 0.6-1.1 units/ml Monitor platelets by anticoagulation protocol: Yes   Plan:  Continue Lovenox 1mg /kg SQ q12 Daily CBC  RPh 04/23/2020, 2:01 PM

## 2020-04-23 NOTE — Progress Notes (Signed)
Occupational Therapy Treatment Patient Details Name: Richard Lopez MRN: [id number] DOB: June 08, 1976 Today's Date: 04/23/2020    History of present illness Pt admitted with AMS and delirium tremons complicated by Aspiration PNA, a-fib, and bilat knee arthritis flare.  Bil knee aspiratioin 6/16.  Pt with hx of DM, PE, bil LE IM nailing and ETOH abuse   OT comments  Pt demonstrated ability to walk entire unit with supervision and RW. Dressed with set up, toileted and groomed with supervision for safety. Pt is eager to discharge.  Follow Up Recommendations  No OT follow up    Equipment Recommendations  None recommended by OT    Recommendations for Other Services      Precautions / Restrictions Precautions Precautions: Fall       Mobility Bed Mobility Overal bed mobility: Modified Independent             General bed mobility comments: HOB up  Transfers Overall transfer level: Needs assistance Equipment used: Rolling walker (2 wheeled) Transfers: Sit to/from Stand Sit to Stand: Supervision         General transfer comment: adjusted height of walker    Balance     Sitting balance-Leahy Scale: Good       Standing balance-Leahy Scale: Poor Standing balance comment: reliant on RW for ambulation                            ADL either performed or assessed with clinical judgement   ADL Overall ADL's : Needs assistance/impaired     Grooming: Supervision/safety;Standing;Wash/dry hands           Upper Body Dressing : Set up;Sitting   Lower Body Dressing: Set up;Sitting/lateral leans   Toilet Transfer: Supervision/safety;Ambulation;RW   Toileting- Clothing Manipulation and Hygiene: Supervision/safety       Functional mobility during ADLs: Supervision/safety;Rolling walker (walked entire unit)       Vision       Perception     Praxis      Cognition Arousal/Alertness: Awake/alert Behavior During Therapy: WFL for tasks  assessed/performed Overall Cognitive Status: Within Functional Limits for tasks assessed                                          Exercises     Shoulder Instructions       General Comments      Pertinent Vitals/ Pain       Pain Assessment: Faces Faces Pain Scale: Hurts a little bit Pain Location: bil knees Pain Descriptors / Indicators: Sore Pain Intervention(s): Monitored during session  Home Living                                          Prior Functioning/Environment              Frequency  Min 2X/week        Progress Toward Goals  OT Goals(current goals can now be found in the care plan section)  Progress towards OT goals: Progressing toward goals  Acute Rehab OT Goals Patient Stated Goal: get home OT Goal Formulation: With patient Time For Goal Achievement: 05/06/20 Potential to Achieve Goals: Good  Plan Discharge plan remains appropriate    Co-evaluation  AM-PAC OT "6 Clicks" Daily Activity     Outcome Measure   Help from another person eating meals?: None Help from another person taking care of personal grooming?: A Little Help from another person toileting, which includes using toliet, bedpan, or urinal?: A Little Help from another person bathing (including washing, rinsing, drying)?: A Little Help from another person to put on and taking off regular upper body clothing?: None Help from another person to put on and taking off regular lower body clothing?: A Little 6 Click Score: 20    End of Session Equipment Utilized During Treatment: Rolling walker;Gait belt  OT Visit Diagnosis: Unsteadiness on feet (R26.81);Other abnormalities of gait and mobility (R26.89)   Activity Tolerance Patient tolerated treatment well   Patient Left in bed;with call bell/phone within reach;with bed alarm set   Nurse Communication          Time: 0254-8628 OT Time Calculation (min): 24 min  Charges:  OT General Charges $OT Visit: 1 Visit OT Treatments $Self Care/Home Management : 8-22 mins $Therapeutic Activity: 8-22 mins  Martie Round, OTR/L Acute Rehabilitation Services Pager: [phone] Office: [phone]   Evern Bio 04/23/2020, 12:06 PM

## 2020-04-24 ENCOUNTER — Telehealth: Payer: Self-pay

## 2020-04-24 LAB — CULTURE, BLOOD (ROUTINE X 2): Special Requests: ADEQUATE

## 2020-04-24 NOTE — Telephone Encounter (Addendum)
Transition Care Management Follow-up Telephone Call  Date of discharge and from where: 04/23/2020, [facility]   How have you been since you were released from the hospital?  He stated that he is doing all right, he has been ambulating with the walker and his balance is improving.   Any questions or concerns? none at this time  Items Reviewed:  Did the pt receive and understand the discharge instructions provided?  yes  Medications obtained and verified?  he does not have any medications.  He said his mother is taking him this afternoon to pick them up.  Any new allergies since your discharge? none reported  Do you have support at home? lives alone but has people who can check on him   Other (ie: DME, Home Health, etc) no home health or DME ordered   Functional Questionnaire: (I = Independent and D = Dependent) ADL's: independent. Using walker with ambulation.    Follow up appointments reviewed:    PCP Hospital f/u appt confirmed? Scheduled appt [facility] 04/30/2020 @ 1450  Specialist Hospital f/u appt confirmed?none scheduled at this time  Are transportation arrangements needed? no, he has transportation  If their condition worsens, is the pt aware to call  their PCP or go to the ED? yes  Was the patient provided with contact information for the PCP's office or ED?  the [facility] clinic phone number and appointment information was text to him as he requested  Was the pt encouraged to call back with questions or concerns?  yes

## 2020-04-26 LAB — CULTURE, BLOOD (ROUTINE X 2)
Culture: NO GROWTH
Culture: NO GROWTH
Special Requests: ADEQUATE
Special Requests: ADEQUATE

## 2020-04-30 ENCOUNTER — Ambulatory Visit (INDEPENDENT_AMBULATORY_CARE_PROVIDER_SITE_OTHER): Payer: Self-pay | Admitting: Primary Care

## 2020-05-09 ENCOUNTER — Encounter (INDEPENDENT_AMBULATORY_CARE_PROVIDER_SITE_OTHER): Payer: Self-pay | Admitting: Primary Care

## 2020-05-09 ENCOUNTER — Other Ambulatory Visit: Payer: Self-pay

## 2020-05-09 ENCOUNTER — Ambulatory Visit (INDEPENDENT_AMBULATORY_CARE_PROVIDER_SITE_OTHER): Payer: Self-pay | Admitting: Primary Care

## 2020-05-09 VITALS — BP 132/83 | HR 96 | Temp 98.3°F | Ht 78.0 in | Wt 214.2 lb

## 2020-05-09 DIAGNOSIS — Z09 Encounter for follow-up examination after completed treatment for conditions other than malignant neoplasm: Secondary | ICD-10-CM

## 2020-05-09 DIAGNOSIS — I10 Essential (primary) hypertension: Secondary | ICD-10-CM

## 2020-05-09 DIAGNOSIS — I4891 Unspecified atrial fibrillation: Secondary | ICD-10-CM

## 2020-05-09 MED ORDER — DILTIAZEM HCL ER COATED BEADS 120 MG PO CP24: 120.0000 mg | ORAL_CAPSULE | Freq: Every day | ORAL | 3 refills | Status: DC

## 2020-05-09 MED ORDER — METOPROLOL TARTRATE 75 MG PO TABS: 75.0000 mg | ORAL_TABLET | Freq: Two times a day (BID) | ORAL | 3 refills | Status: DC

## 2020-05-09 NOTE — Patient Instructions (Signed)

## 2020-05-09 NOTE — Progress Notes (Signed)
Assessment and Plan:    HPI [name] is a 44 y.o.male presents for follow up from the hospital. Admit date to the hospital was 04/17/20, patient was discharged from the hospital on 04/23/20, patient was admitted for: Altered mental status having  hallucinations because he took his girlfriend's gabapentin. Girlfriend and patient have there medication on the same counter and he did not pay attention to the bottle. Also, pain and swelling in his Lt knee,  positive Gram stain of Lt knee arthrocentesis.   Past Medical History:  Diagnosis Date  . Anxiety   . 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 Depression   . Diabetes mellitus    diet controlled  . Essential hypertension   . Gastritis   . Gout   . Normocytic anemia due to blood loss 08/21/2019  . Pulmonary embolism (HCC)      No Known Allergies    Current Outpatient Medications on File Prior to Visit  Medication Sig Dispense Refill  . acetaminophen (TYLENOL) 325 MG tablet Take 2 tablets (650 mg total) by mouth every 6 (six) hours. 60 tablet 0  . aspirin EC 81 MG EC tablet Take 1 tablet (81 mg total) by mouth daily. Swallow whole. 30 tablet 11  . diclofenac Sodium (VOLTAREN) 1 % GEL Apply 2 g topically in the morning, at noon, and at bedtime. Apply to both knees. 350 g 0  . diltiazem (CARDIZEM CD) 120 MG 24 hr capsule Take 1 capsule (120 mg total) by mouth daily. First down 04/23/20 at 6:00 pm. (Patient not taking: Reported on 05/09/2020) 30 capsule 0  . Metoprolol Tartrate 75 MG TABS Take 75 mg by mouth 2 (two) times daily. (Patient not taking: Reported on 05/09/2020) 60 tablet 0   No current facility-administered medications on file prior to visit.    ROS:  Review of Systems  Musculoskeletal: Positive for joint pain.       Bilateral knee pain  Right hip (fx twice)   All other systems reviewed and are negative.   Physical Exam: Filed Weights   05/09/20 1450  Weight: 214 lb 3.2 oz (97.2 kg)   BP 132/83 (BP Location: Right  Arm, Patient Position: Sitting, Cuff Size: Normal)   Pulse 96   Temp 98.3 F (36.8 C) (Oral)   Ht 6\' 6"  (1.981 m)   Wt 214 lb 3.2 oz (97.2 kg)   SpO2 95%   BMI 24.75 kg/m  General Appearance: Well nourished, in distress unable to keep still pain in knees/ankles  Eyes: PERRLA, EOMs, conjunctiva   Hearing normal.  Neck: Supple, thyroid normal.  Respiratory: Respiratory effort normal, BS equal bilaterally without rales, rhonchi, wheezing or stridor.  Cardio: RRR with no MRGs. Brisk peripheral pulses without edema.  Abdomen: Soft, + BS.  Non tender, no guarding, rebound, hernias, masses. Lymphatics: Non tender without lymphadenopathy.  Musculoskeletal: Full ROM, 5/5 strength,  Unstable gait uses a cane for stability and pain right > left ankle edema Skin: Warm, dry without rashes, lesions, ecchymosis.  Neuro: Cranial nerves intact. Normal muscle tone, no cerebellar symptoms. Sensation intact.  Psych: Awake and oriented X 3, normal affect, Insight and Judgment appropriate.    Richard Lopez was seen today for hospitalization follow-up.  Diagnoses and all orders for this visit:  Hospital discharge follow-up Recommendations for Outpatient Follow-up and new medication changes:  1. Follow up with Baldo Ash, NP in 7 days.  2. Patient will continue with metoprolol and diltiazem for rate control  atrial fibrillation. 3. Antiplatelet therapy with aspirin.  4. Patient unable to Afford medication transferred to [facility]   Essential hypertension Bp today is 132/83 without medication he has been monitoring intake of sodium 30/80, low-sodium, DASH diet, medication compliance, 150 minutes of moderate intensity exercise per week. He will try to pick up medication in 1 week  Atrial fibrillation with RVR (HCC) Atrial fibrillation-explained this was a characterized by rapid uncoordinated atrial electrical activity and an irregular irregularly ventricular response. Managed by beta blocker and will need to  pick up metoprolol 75mg  twice daily.    

## 2020-05-10 MED FILL — METOPROLOL TARTRATE 75 MG T: 75 | 30 days supply | Qty: 60 | Fill #0

## 2020-05-10 MED FILL — DILTIAZEM 24HR ER 120 MG CA: 120 | 30 days supply | Qty: 30 | Fill #0

## 2020-06-18 ENCOUNTER — Encounter (HOSPITAL_COMMUNITY): Payer: Self-pay | Admitting: Emergency Medicine

## 2020-06-18 ENCOUNTER — Other Ambulatory Visit: Payer: Self-pay

## 2020-06-18 ENCOUNTER — Inpatient Hospital Stay (HOSPITAL_COMMUNITY)
Admission: EM | Admit: 2020-06-18 | Discharge: 2020-06-21 | DRG: 683 | Disposition: A | Discharge status: Home / Self Care | Payer: Self-pay | Attending: Internal Medicine | Admitting: Internal Medicine

## 2020-06-18 ENCOUNTER — Emergency Department (HOSPITAL_COMMUNITY): Payer: Self-pay

## 2020-06-18 DIAGNOSIS — Z7982 Long term (current) use of aspirin: Secondary | ICD-10-CM

## 2020-06-18 DIAGNOSIS — I10 Essential (primary) hypertension: Secondary | ICD-10-CM | POA: Diagnosis present

## 2020-06-18 DIAGNOSIS — Z79899 Other long term (current) drug therapy: Secondary | ICD-10-CM

## 2020-06-18 DIAGNOSIS — E119 Type 2 diabetes mellitus without complications: Secondary | ICD-10-CM | POA: Diagnosis present

## 2020-06-18 DIAGNOSIS — Z8249 Family history of ischemic heart disease and other diseases of the circulatory system: Secondary | ICD-10-CM

## 2020-06-18 DIAGNOSIS — Z789 Other specified health status: Secondary | ICD-10-CM

## 2020-06-18 DIAGNOSIS — I480 Paroxysmal atrial fibrillation: Secondary | ICD-10-CM

## 2020-06-18 DIAGNOSIS — Z20822 Contact with and (suspected) exposure to covid-19: Secondary | ICD-10-CM | POA: Diagnosis present

## 2020-06-18 DIAGNOSIS — Z7289 Other problems related to lifestyle: Secondary | ICD-10-CM

## 2020-06-18 DIAGNOSIS — E861 Hypovolemia: Secondary | ICD-10-CM | POA: Diagnosis present

## 2020-06-18 DIAGNOSIS — R7401 Elevation of levels of liver transaminase levels: Secondary | ICD-10-CM

## 2020-06-18 DIAGNOSIS — D649 Anemia, unspecified: Secondary | ICD-10-CM

## 2020-06-18 DIAGNOSIS — Z87891 Personal history of nicotine dependence: Secondary | ICD-10-CM

## 2020-06-18 DIAGNOSIS — K2920 Alcoholic gastritis without bleeding: Secondary | ICD-10-CM | POA: Diagnosis present

## 2020-06-18 DIAGNOSIS — R339 Retention of urine, unspecified: Secondary | ICD-10-CM | POA: Diagnosis present

## 2020-06-18 DIAGNOSIS — F1290 Cannabis use, unspecified, uncomplicated: Secondary | ICD-10-CM | POA: Diagnosis present

## 2020-06-18 DIAGNOSIS — E8729 Other acidosis: Secondary | ICD-10-CM

## 2020-06-18 DIAGNOSIS — D638 Anemia in other chronic diseases classified elsewhere: Secondary | ICD-10-CM | POA: Diagnosis present

## 2020-06-18 DIAGNOSIS — Z86711 Personal history of pulmonary embolism: Secondary | ICD-10-CM

## 2020-06-18 DIAGNOSIS — E860 Dehydration: Secondary | ICD-10-CM

## 2020-06-18 DIAGNOSIS — K2100 Gastro-esophageal reflux disease with esophagitis, without bleeding: Secondary | ICD-10-CM | POA: Diagnosis present

## 2020-06-18 DIAGNOSIS — F418 Other specified anxiety disorders: Secondary | ICD-10-CM | POA: Diagnosis present

## 2020-06-18 DIAGNOSIS — R112 Nausea with vomiting, unspecified: Secondary | ICD-10-CM

## 2020-06-18 DIAGNOSIS — I4891 Unspecified atrial fibrillation: Secondary | ICD-10-CM | POA: Diagnosis present

## 2020-06-18 DIAGNOSIS — N179 Acute kidney failure, unspecified: Principal | ICD-10-CM | POA: Diagnosis present

## 2020-06-18 DIAGNOSIS — Z833 Family history of diabetes mellitus: Secondary | ICD-10-CM

## 2020-06-18 DIAGNOSIS — E871 Hypo-osmolality and hyponatremia: Secondary | ICD-10-CM | POA: Diagnosis present

## 2020-06-18 DIAGNOSIS — Z86718 Personal history of other venous thrombosis and embolism: Secondary | ICD-10-CM

## 2020-06-18 DIAGNOSIS — E876 Hypokalemia: Secondary | ICD-10-CM | POA: Diagnosis present

## 2020-06-18 DIAGNOSIS — F1090 Alcohol use, unspecified, uncomplicated: Secondary | ICD-10-CM

## 2020-06-18 DIAGNOSIS — E872 Acidosis: Secondary | ICD-10-CM | POA: Diagnosis present

## 2020-06-18 LAB — ETHANOL: Alcohol, Ethyl (B): <10 mg/dL (ref <10)

## 2020-06-18 LAB — BASIC METABOLIC PANEL
Anion gap: 20 — ABNORMAL HIGH (ref 5–15)
Anion gap: 14 (ref 5–15)
BUN: 35 mg/dL — ABNORMAL HIGH (ref 6–20)
BUN: 27 mg/dL — ABNORMAL HIGH (ref 6–20)
CO2: 23 mmol/L (ref 22–32)
CO2: 25 mmol/L (ref 22–32)
Calcium: 9.5 mg/dL (ref 8.9–10.3)
Calcium: 8.4 mg/dL — ABNORMAL LOW (ref 8.9–10.3)
Chloride: 81 mmol/L — ABNORMAL LOW (ref 98–111)
Chloride: 89 mmol/L — ABNORMAL LOW (ref 98–111)
Creatinine, Ser: 3.4 mg/dL — ABNORMAL HIGH (ref 0.61–1.24)
Creatinine, Ser: 1.8 mg/dL — ABNORMAL HIGH (ref 0.61–1.24)
GFR calc Af Amer: 24 mL/min — ABNORMAL LOW (ref >60)
GFR calc Af Amer: 52 mL/min — ABNORMAL LOW (ref >60)
GFR calc non Af Amer: 21 mL/min — ABNORMAL LOW (ref >60)
GFR calc non Af Amer: 45 mL/min — ABNORMAL LOW (ref >60)
Glucose, Bld: 176 mg/dL — ABNORMAL HIGH (ref 70–99)
Glucose, Bld: 106 mg/dL — ABNORMAL HIGH (ref 70–99)
Potassium: 3.2 mmol/L — ABNORMAL LOW (ref 3.5–5.1)
Potassium: 3.5 mmol/L (ref 3.5–5.1)
Sodium: 124 mmol/L — ABNORMAL LOW (ref 135–145)
Sodium: 128 mmol/L — ABNORMAL LOW (ref 135–145)

## 2020-06-18 LAB — URINALYSIS, ROUTINE W REFLEX MICROSCOPIC
Bilirubin Urine: NEGATIVE
Glucose, UA: NEGATIVE mg/dL
Ketones, ur: 5 mg/dL — AB
Leukocytes,Ua: NEGATIVE
Nitrite: NEGATIVE
Protein, ur: 30 mg/dL — AB
Specific Gravity, Urine: 1.017 (ref 1.005–1.030)
pH: 5 (ref 5.0–8.0)

## 2020-06-18 LAB — HEPATIC FUNCTION PANEL
ALT: 29 U/L (ref 0–44)
AST: 67 U/L — ABNORMAL HIGH (ref 15–41)
Albumin: 4.8 g/dL (ref 3.5–5.0)
Alkaline Phosphatase: 110 U/L (ref 38–126)
Bilirubin, Direct: 0.3 mg/dL — ABNORMAL HIGH (ref 0.0–0.2)
Indirect Bilirubin: 0.8 mg/dL (ref 0.3–0.9)
Total Bilirubin: 1.1 mg/dL (ref 0.3–1.2)
Total Protein: 10.1 g/dL — ABNORMAL HIGH (ref 6.5–8.1)

## 2020-06-18 LAB — CBC
HCT: 43.5 % (ref 39.0–52.0)
Hemoglobin: 15.4 g/dL (ref 13.0–17.0)
MCH: 30.9 pg (ref 26.0–34.0)
MCHC: 35.4 g/dL (ref 30.0–36.0)
MCV: 87.2 fL (ref 80.0–100.0)
Platelets: 249 10*3/uL (ref 150–400)
RBC: 4.99 MIL/uL (ref 4.22–5.81)
RDW: 13 % (ref 11.5–15.5)
WBC: 7.8 10*3/uL (ref 4.0–10.5)
nRBC: 0 % (ref 0.0–0.2)

## 2020-06-18 LAB — CK: Total CK: 352 U/L (ref 49–397)

## 2020-06-18 LAB — LACTIC ACID, PLASMA: Lactic Acid, Venous: 2.1 mmol/L (ref 0.5–1.9)

## 2020-06-18 LAB — SARS CORONAVIRUS 2 BY RT PCR (HOSPITAL ORDER, PERFORMED IN ~~LOC~~ HOSPITAL LAB): SARS Coronavirus 2: NEGATIVE

## 2020-06-18 LAB — AMMONIA: Ammonia: 20 umol/L (ref 9–35)

## 2020-06-18 LAB — MAGNESIUM: Magnesium: 1.8 mg/dL (ref 1.7–2.4)

## 2020-06-18 LAB — SODIUM, URINE, RANDOM: Sodium, Ur: <10 mmol/L

## 2020-06-18 LAB — LIPASE, BLOOD: Lipase: 35 U/L (ref 11–51)

## 2020-06-18 MED ORDER — SODIUM CHLORIDE 0.9 % IV SOLN: INTRAVENOUS | Status: DC

## 2020-06-18 MED ORDER — ACETAMINOPHEN 325 MG PO TABS
650.0000 mg | ORAL_TABLET | Freq: Four times a day (QID) | ORAL | Status: DC
  Administered 2020-06-18 – 2020-06-20 (×8): 650 mg via ORAL
  Filled 2020-06-18 (×8): qty 2

## 2020-06-18 MED ORDER — LACTATED RINGERS IV BOLUS
2000.0000 mL | Freq: Once | INTRAVENOUS | Status: AC
  Administered 2020-06-18: 2000 mL via INTRAVENOUS

## 2020-06-18 MED ORDER — METOPROLOL TARTRATE 25 MG PO TABS
75.0000 mg | ORAL_TABLET | Freq: Two times a day (BID) | ORAL | Status: DC
  Administered 2020-06-18 – 2020-06-20 (×5): 75 mg via ORAL
  Filled 2020-06-18 (×6): qty 3

## 2020-06-18 MED ORDER — POTASSIUM CHLORIDE CRYS ER 20 MEQ PO TBCR
40.0000 meq | EXTENDED_RELEASE_TABLET | Freq: Once | ORAL | Status: AC
  Administered 2020-06-18: 40 meq via ORAL
  Filled 2020-06-18: qty 2

## 2020-06-18 MED ORDER — ONDANSETRON HCL 4 MG PO TABS: 4.0000 mg | ORAL_TABLET | Freq: Four times a day (QID) | ORAL | Status: DC | PRN

## 2020-06-18 MED ORDER — POLYETHYLENE GLYCOL 3350 17 G PO PACK
17.0000 g | PACK | Freq: Two times a day (BID) | ORAL | Status: AC
  Administered 2020-06-19: 17 g via ORAL
  Filled 2020-06-18 (×2): qty 1

## 2020-06-18 MED ORDER — POTASSIUM CHLORIDE CRYS ER 20 MEQ PO TBCR
20.0000 meq | EXTENDED_RELEASE_TABLET | Freq: Two times a day (BID) | ORAL | Status: DC
  Administered 2020-06-18: 20 meq via ORAL
  Filled 2020-06-18: qty 1

## 2020-06-18 MED ORDER — THIAMINE HCL 100 MG/ML IJ SOLN
Freq: Once | INTRAVENOUS | Status: AC
  Filled 2020-06-18: qty 1000

## 2020-06-18 MED ORDER — ONDANSETRON HCL 4 MG/2ML IJ SOLN
4.0000 mg | Freq: Four times a day (QID) | INTRAMUSCULAR | Status: DC | PRN
  Administered 2020-06-18: 4 mg via INTRAVENOUS
  Filled 2020-06-18: qty 2

## 2020-06-18 MED ORDER — LACTATED RINGERS IV BOLUS
1000.0000 mL | Freq: Once | INTRAVENOUS | Status: AC
  Administered 2020-06-18: 1000 mL via INTRAVENOUS

## 2020-06-18 MED ORDER — HYDRALAZINE HCL 20 MG/ML IJ SOLN
10.0000 mg | Freq: Once | INTRAMUSCULAR | Status: AC
  Administered 2020-06-18: 10 mg via INTRAVENOUS
  Filled 2020-06-18: qty 1

## 2020-06-18 MED ORDER — TRAMADOL HCL 50 MG PO TABS
50.0000 mg | ORAL_TABLET | Freq: Four times a day (QID) | ORAL | Status: DC | PRN
  Administered 2020-06-18: 50 mg via ORAL
  Filled 2020-06-18: qty 1

## 2020-06-18 MED ORDER — ASPIRIN EC 81 MG PO TBEC
81.0000 mg | DELAYED_RELEASE_TABLET | Freq: Every day | ORAL | Status: DC
  Administered 2020-06-18 – 2020-06-21 (×4): 81 mg via ORAL
  Filled 2020-06-18 (×4): qty 1

## 2020-06-18 MED ORDER — PANTOPRAZOLE SODIUM 40 MG PO TBEC
40.0000 mg | DELAYED_RELEASE_TABLET | Freq: Two times a day (BID) | ORAL | Status: DC
  Administered 2020-06-18 – 2020-06-21 (×7): 40 mg via ORAL
  Filled 2020-06-18 (×7): qty 1

## 2020-06-18 MED ORDER — DILTIAZEM HCL ER COATED BEADS 120 MG PO CP24
120.0000 mg | ORAL_CAPSULE | Freq: Every day | ORAL | Status: DC
  Administered 2020-06-18 – 2020-06-20 (×3): 120 mg via ORAL
  Filled 2020-06-18 (×4): qty 1

## 2020-06-18 MED ORDER — ENOXAPARIN SODIUM 40 MG/0.4ML ~~LOC~~ SOLN
40.0000 mg | SUBCUTANEOUS | Status: DC
  Administered 2020-06-18 – 2020-06-20 (×3): 40 mg via SUBCUTANEOUS
  Filled 2020-06-18 (×3): qty 0.4

## 2020-06-18 NOTE — ED Provider Notes (Signed)
[facility]-EMERGENCY DEPT Provider Note   CSN: [id number] Arrival date & time: 06/18/20  0203     History Chief Complaint  Patient presents with  . Urinary Retention  . Dehydration    Richard Lopez is a 44 y.o. male.  Patient is a 44 year old male with a prior history of heavy alcohol abuse, PE, gastritis, diet-controlled diabetes, hypertension, cavitary lung lesion, vitamin deficiency, hypokalemia and hypomagnesemia who is presenting today with complaint of not urinating.  Patient states he did have 1-2 episodes of vomiting on Thursday and Friday but he is not urinated since Thursday.  He denies any abdominal pain or feeling the urge to urinate.  He denies fever but has reported a cough over the last few days that is been nonproductive.  He denies any shortness of breath.  He states he has not eaten since Thursday but cannot give a reason why he is not eating.  He states he has been drinking water.  He reports that now he only drinks 1-2 beers a week because he used to drink heavily and had gotten sick.  He denies alcohol or tobacco use.  He does report feeling sleepy but denies hallucinations.  The history is provided by the patient.       Past Medical History:  Diagnosis Date  . Anxiety   . 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 Depression   . Diabetes mellitus    diet controlled  . Essential hypertension   . Gastritis   . Gout   . Normocytic anemia due to blood loss 08/21/2019  . Pulmonary embolism [facility])     Patient Active Problem List   Diagnosis Date Noted  . Atrial fibrillation with RVR (HCC) 04/22/2020  . 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  . Hyponatremia 04/21/2020  . Aspiration pneumonia (HCC) 04/21/2020  . Esophagitis determined by endoscopy 08/21/2019  . Normocytic anemia due to blood loss 08/21/2019  . Hypokalemia 08/17/2019  . Hypomagnesemia 08/17/2019  . Hemoptysis 08/17/2019  . Acute upper GI bleed 08/14/2019  . Hematemesis  08/13/2019  . Cavitary lesion of lung 08/01/2019  . Gout 05/18/2019  . Peri-prosthetic fracture of femur at tip of prosthesis 05/16/2019  . Alcohol use 05/16/2019  . Intertrochanteric fracture of right hip (HCC) 04/14/2019  . Vitamin D deficiency 04/14/2019  . DTs (delirium tremens) (HCC) 04/07/2019  . Delirium tremens (HCC) 04/07/2019  . Encephalopathy acute   . Closed right hip fracture, initial encounter (HCC) 03/25/2019  . Alcohol dependence with intoxication (HCC) 03/25/2019  . Hypertensive urgency 03/25/2019  . Thrombocytopenia (HCC) 03/25/2019  . Effusion, right knee 03/25/2019  . Closed fracture of right femur (HCC)   . Metabolic acidosis   . Prediabetes 04/02/2017  . Gastritis 04/02/2017  . Marijuana abuse 04/01/2017  . Nausea with vomiting 07/29/2014  . Type 2 diabetes mellitus with hyperlipidemia (HCC) 07/29/2014  . Essential hypertension 07/29/2014  . Nausea & vomiting 07/29/2014    Past Surgical History:  Procedure Laterality Date  . ESOPHAGOGASTRODUODENOSCOPY (EGD) WITH PROPOFOL N/A 08/14/2019   Procedure: ESOPHAGOGASTRODUODENOSCOPY (EGD) WITH PROPOFOL;  Surgeon: Charlott Rakes, MD;  Location: [facility];  Service: Endoscopy;  Laterality: N/A;  . EXTERNAL FIXATION LEG Left 11/07/2018   Procedure: EXTERNAL FIXATION LEFT LOWER LEG;  Surgeon: Samson Frederic, MD;  Location: [facility]S;  Service: Orthopedics;  Laterality: Left;  . EXTERNAL FIXATION REMOVAL Left 11/08/2018   Procedure: REMOVAL EXTERNAL FIXATION LEG;  Surgeon: Roby Lofts, MD;  Location: [facility] OR;  Service: Orthopedics;  Laterality: Left;  . INTRAMEDULLARY (IM) NAIL INTERTROCHANTERIC Right 03/26/2019   Procedure: INTRAMEDULLARY (IM) NAIL INTERTROCHANTRIC;  Surgeon: Roby Lofts, MD;  Location: [facility] OR;  Service: Orthopedics;  Laterality: Right;  . INTRAMEDULLARY (IM) NAIL INTERTROCHANTERIC Right 05/17/2019   Procedure: Intramedullary (Im) Nail Intertroch with circlage wiring;  Surgeon: Myrene Galas, MD;   Location: [facility] OR;  Service: Orthopedics;  Laterality: Right;  . NO PAST SURGERIES    . OPEN REDUCTION INTERNAL FIXATION (ORIF) TIBIA/FIBULA FRACTURE Left 11/08/2018   Procedure: OPEN REDUCTION INTERNAL FIXATION (ORIF) TIBIA/FIBULA FRACTURE;  Surgeon: Roby Lofts, MD;  Location: [facility] OR;  Service: Orthopedics;  Laterality: Left;  . ORIF FEMUR FRACTURE Right 05/17/2019   Procedure: REMOVAL  OF HARDWARE;  Surgeon: Myrene Galas, MD;  Location: [facility] OR;  Service: Orthopedics;  Laterality: Right;       Family History  Problem Relation Age of Onset  . Diabetes Mellitus II Father   . Diabetes Mellitus II Other   . CAD Other     Social History   Tobacco Use  . Smoking status: Former Smoker    Packs/day: 0.00    Types: Cigarettes  . Smokeless tobacco: Never Used  . Tobacco comment: About 1 cigarette per day or less  Vaping Use  . Vaping Use: Never used  Substance Use Topics  . Alcohol use: Yes    Alcohol/week: 200.0 standard drinks    Types: 200 Cans of beer per week  . Drug use: Yes    Types: Marijuana    Home Medications Prior to Admission medications   Medication Sig Start Date End Date Taking? Authorizing Provider  acetaminophen (TYLENOL) 325 MG tablet Take 2 tablets (650 mg total) by mouth every 6 (six) hours. 04/23/20   Arrien, York Ram, MD  aspirin EC 81 MG EC tablet Take 1 tablet (81 mg total) by mouth daily. Swallow whole. 04/23/20   Arrien, York Ram, MD  diclofenac Sodium (VOLTAREN) 1 % GEL Apply 2 g topically in the morning, at noon, and at bedtime. Apply to both knees. 04/23/20   Arrien, York Ram, MD  diltiazem (CARDIZEM CD) 120 MG 24 hr capsule Take 1 capsule (120 mg total) by mouth daily. First down 04/23/20 at 6:00 pm. 05/09/20 06/08/20  [name], NP  Metoprolol Tartrate 75 MG TABS Take 75 mg by mouth 2 (two) times daily. 05/09/20 06/08/20  [name], NP    Allergies    Patient has no known allergies.  Review of Systems   Review of  Systems  All other systems reviewed and are negative.   Physical Exam Updated Vital Signs BP (!) 156/117 (BP Location: Left Arm)   Pulse (!) 103   Temp 98.3 F (36.8 C) (Oral)   Resp 15   Ht 6\' 6"  (1.981 m)   Wt 97.1 kg   SpO2 99%   BMI 24.73 kg/m   Physical Exam Vitals and nursing note reviewed.  Constitutional:      General: He is not in acute distress.    Appearance: He is well-developed and normal weight.  HENT:     Head: Normocephalic and atraumatic.     Right Ear: Tympanic membrane normal.     Left Ear: Tympanic membrane normal.     Nose: Rhinorrhea present.     Mouth/Throat:     Mouth: Mucous membranes are dry.  Eyes:     Conjunctiva/sclera: Conjunctivae normal.     Comments: Pupils are 52mm and reactive  Cardiovascular:     Rate and Rhythm: Regular rhythm. Tachycardia present.     Pulses: Normal pulses.     Heart sounds: No murmur heard.   Pulmonary:     Effort: Pulmonary effort is normal. No respiratory distress.     Breath sounds: Normal breath sounds. No wheezing or rales.  Abdominal:     General: There is no distension.     Palpations: Abdomen is soft.     Tenderness: There is no abdominal tenderness. There is no guarding or rebound.  Musculoskeletal:        General: No tenderness. Normal range of motion.     Cervical back: Normal range of motion and neck supple.  Skin:    General: Skin is warm and dry.     Findings: No erythema or rash.     Comments: Faint petechia present on the feet  Neurological:     Mental Status: He is alert and oriented to person, place, and time.     Comments: Intermittently nodding off during the exam but easily arousable  Psychiatric:     Comments: Calm and cooperative.  Does not appear to be responding to internal stimuli     ED Results / Procedures / Treatments   Labs (all labs ordered are listed, but only abnormal results are displayed) Labs Reviewed  BASIC METABOLIC PANEL - Abnormal; Notable for the following  components:      Result Value   Sodium 124 (*)    Potassium 3.2 (*)    Chloride 81 (*)    Glucose, Bld 176 (*)    BUN 35 (*)    Creatinine, Ser 3.40 (*)    GFR calc non Af Amer 21 (*)    GFR calc Af Amer 24 (*)    Anion gap 20 (*)    All other components within normal limits  LACTIC ACID, PLASMA - Abnormal; Notable for the following components:   Lactic Acid, Venous 2.1 (*)    All other components within normal limits  HEPATIC FUNCTION PANEL - Abnormal; Notable for the following components:   Total Protein 10.1 (*)    AST 67 (*)    Bilirubin, Direct 0.3 (*)    All other components within normal limits  SARS CORONAVIRUS 2 BY RT PCR (HOSPITAL ORDER, PERFORMED IN [facility])  CBC  LIPASE, BLOOD  MAGNESIUM  ETHANOL  AMMONIA  URINALYSIS, ROUTINE W REFLEX MICROSCOPIC    EKG EKG Interpretation  Date/Time:  Monday June 18 2020 08:36:58 EDT Ventricular Rate:  90 PR Interval:    QRS Duration: 93 QT Interval:  417 QTC Calculation: 511 R Axis:   54 Text Interpretation: Sinus rhythm Atrial premature complexes LAE, consider biatrial enlargement Anteroseptal infarct, old Minimal ST depression, inferior leads Prolonged QT interval Atrial fibrillation RESOLVED SINCE PREVIOUS Confirmed by Gwyneth Sprout ([id number]) on 06/18/2020 9:39:05 AM   Radiology DG Chest Port 1 View  Result Date: 06/18/2020 CLINICAL DATA:  Cough EXAM: PORTABLE CHEST 1 VIEW COMPARISON:  04/20/2020 chest radiograph and prior. FINDINGS: Cardiomediastinal silhouette within normal limits. Both lungs are clear. No pneumothorax or pleural effusion. No acute osseous abnormality. IMPRESSION: No focal airspace disease. Electronically Signed   By: Stana Bunting M.D.   On: 06/18/2020 08:33    Procedures Procedures (including critical care time)  Medications Ordered in ED Medications  lactated ringers bolus 1,000 mL (1,000 mLs Intravenous New Bag/Given 06/18/20 0735)    ED Course  I have reviewed  the triage vital  signs and the nursing notes.  Pertinent labs & imaging results that were available during my care of the patient were reviewed by me and considered in my medical decision making (see chart for details).    MDM Rules/Calculators/A&P                          Patient presenting today with complaint of inability to urinate.  Patient has only 18 mL on bladder scan and feel that he is in acute renal failure.  Patient states he has been drinking fluids but concerned this could be from dehydration.  He has an elevated anion gap of 20 with elevated creatinine of 3.8 and hyponatremia of 124.  Mild hypokalemia of 3.2.  CBC with normal findings.  Patient does have a cough but no hypoxia or complaint of shortness of breath.  He in the past has been a heavy alcohol abuser and concern for possible alcoholic pancreatitis although patient denies any abdominal pain and has no pain on exam currently.  Also concerned that this could be related to heavy alcohol use.  Concern for electrolyte deficiency.  Patient is intermittently falling asleep but cooperative.  Concern for possible elevated ammonia.  No signs of DKA even though he does have a history of diet-controlled diabetes.  Will fluid hydrate check magnesium, LFTs, lipase, ammonia, EtOH level and EKG.  Chest x-ray also pending.  9:55 AM Remainder of labs wnl except for lactic acid of 2.1.  CXR wnl.  Will admit for further care.  MDM Number of Diagnoses or Management Options   Amount and/or Complexity of Data Reviewed Clinical lab tests: ordered and reviewed Tests in the radiology section of CPT: ordered and reviewed Tests in the medicine section of CPT: ordered and reviewed Decide to obtain previous medical records or to obtain history from someone other than the patient: yes Obtain history from someone other than the patient: yes Review and summarize past medical records: yes Discuss the patient with other providers: yes Independent  visualization of images, tracings, or specimens: yes  Risk of Complications, Morbidity, and/or Mortality Presenting problems: moderate Diagnostic procedures: low Management options: low  Patient Progress Patient progress: improved  CRITICAL CARE Performed by: Caryle Helgeson Total critical care time: 30 minutes Critical care time was exclusive of separately billable procedures and treating other patients. Critical care was necessary to treat or prevent imminent or life-threatening deterioration. Critical care was time spent personally by me on the following activities: development of treatment plan with patient and/or surrogate as well as nursing, discussions with consultants, evaluation of patient's response to treatment, examination of patient, obtaining history from patient or surrogate, ordering and performing treatments and interventions, ordering and review of laboratory studies, ordering and review of radiographic studies, pulse oximetry and re-evaluation of patient's condition.   Final Clinical Impression(s) / ED Diagnoses Final diagnoses:  AKI (acute kidney injury) (HCC)  Dehydration  Hyponatremia    Rx / DC Orders ED Discharge Orders    None       Gwyneth Sprout, MD 06/18/20 [phone]

## 2020-06-18 NOTE — H&P (Addendum)
History and Physical  Richard Lopez LFY:[id number] DOB: 1976-07-08 DOA: 06/18/2020  PCP: [name], NP Patient coming from: Home  I have personally briefly reviewed patient's old medical records in [facility]   Chief Complaint: Anuric  HPI: Richard Lopez is a 44 y.o. male past medical history of PE and heavy alcohol abuse gastritis and GI bleed due to gastric ulcer, who presents to the ED complaining of being anuric, he relates he went binge drinking on 06/10/2020 his last drink was on 06/12/2020 he did not have any abdominal pain he did have 1 or 2 episodes of vomiting, but he last urinated on Thursday night, he relates that that his urine was dark, woke up Friday and throughout the whole day and whole weekend he did not urinate, he denies any fever, or diarrhea.  Does not consume any other alcohol or has an intention of killing himself or damaging himself usually drinks beer has been drinking a lot of water and Pedialyte and still has not urinated.  Is no new medications or changes in his medication.  In the ED: He was found to be significantly tachycardic vitals are stable, he he denies any dizziness upon standing afebrile, hemic with new acute renal failure elevated blood glucose of 176 anion gap of 18, AST and ALT as per 2-1 total protein is 10.1, lactic acid of 2.1 no leukocytosis, hemoglobin of 15 is negative alcohol level is less than 10, chest x-ray showed no acute findings.   Review of Systems: All systems reviewed and apart from history of presenting illness, are negative.  Past Medical History:  Diagnosis Date  . Anxiety   . 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 Depression   . Diabetes mellitus    diet controlled  . Essential hypertension   . Gastritis   . Gout   . Normocytic anemia due to blood loss 08/21/2019  . Pulmonary embolism [facility])    Past Surgical History:  Procedure Laterality Date  . ESOPHAGOGASTRODUODENOSCOPY (EGD) WITH PROPOFOL N/A  08/14/2019   Procedure: ESOPHAGOGASTRODUODENOSCOPY (EGD) WITH PROPOFOL;  Surgeon: Charlott Rakes, MD;  [facility]: [facility];  Service: Endoscopy;  Laterality: N/A;  . EXTERNAL FIXATION LEG Left 11/07/2018   Procedure: EXTERNAL FIXATION LEFT LOWER LEG;  Surgeon: Samson Frederic, MD;  Location: [facility];  Service: Orthopedics;  Laterality: Left;  . EXTERNAL FIXATION REMOVAL Left 11/08/2018   Procedure: REMOVAL EXTERNAL FIXATION LEG;  Surgeon: Roby Lofts, MD;  Location: [facility] OR;  Service: Orthopedics;  Laterality: Left;  . INTRAMEDULLARY (IM) NAIL INTERTROCHANTERIC Right 03/26/2019   Procedure: INTRAMEDULLARY (IM) NAIL INTERTROCHANTRIC;  Surgeon: Roby Lofts, MD;  Location: [facility] OR;  Service: Orthopedics;  Laterality: Right;  . INTRAMEDULLARY (IM) NAIL INTERTROCHANTERIC Right 05/17/2019   Procedure: Intramedullary (Im) Nail Intertroch with circlage wiring;  Surgeon: Myrene Galas, MD;  Location: [facility] OR;  Service: Orthopedics;  Laterality: Right;  . NO PAST SURGERIES    . OPEN REDUCTION INTERNAL FIXATION (ORIF) TIBIA/FIBULA FRACTURE Left 11/08/2018   Procedure: OPEN REDUCTION INTERNAL FIXATION (ORIF) TIBIA/FIBULA FRACTURE;  Surgeon: Roby Lofts, MD;  Location: [facility];  Service: Orthopedics;  Laterality: Left;  . ORIF FEMUR FRACTURE Right 05/17/2019   Procedure: REMOVAL  OF HARDWARE;  Surgeon: Myrene Galas, MD;  Location: [facility];  Service: Orthopedics;  Laterality: Right;   Social History:  reports that he has quit smoking. His smoking use included cigarettes. He smoked 0.00 packs per day. He has never  used smokeless tobacco. He reports current alcohol use of about 200.0 standard drinks of alcohol per week. He reports current drug use. Drug: Marijuana.   No Known Allergies  Family History  Problem Relation Age of Onset  . Diabetes Mellitus II Father   . Diabetes Mellitus II Other   . CAD Other     Prior to Admission medications   Medication Sig Start Date End Date Taking? Authorizing  Provider  acetaminophen (TYLENOL) 325 MG tablet Take 2 tablets (650 mg total) by mouth every 6 (six) hours. 04/23/20   Arrien, York Ram, MD  aspirin EC 81 MG EC tablet Take 1 tablet (81 mg total) by mouth daily. Swallow whole. 04/23/20   Arrien, York Ram, MD  diclofenac Sodium (VOLTAREN) 1 % GEL Apply 2 g topically in the morning, at noon, and at bedtime. Apply to both knees. 04/23/20   Arrien, York Ram, MD  diltiazem (CARDIZEM CD) 120 MG 24 hr capsule Take 1 capsule (120 mg total) by mouth daily. First down 04/23/20 at 6:00 pm. 05/09/20 06/08/20  [name], NP  Metoprolol Tartrate 75 MG TABS Take 75 mg by mouth 2 (two) times daily. 05/09/20 06/08/20  [name], NP   Physical Exam: Vitals:   06/18/20 0219 06/18/20 0831 06/18/20 1100  BP: (!) 156/117 (!) 179/116 (!) 148/113  Pulse: (!) 103 95   Resp: 15 18 19   Temp: 98.3 F (36.8 C)    TempSrc: Oral    SpO2: 99% 100%   Weight: 97.1 kg    Height: 6\' 6"  (1.981 m)       General exam: Moderately built and nourished patient, lying comfortably supine on the gurney in no obvious distress.  Head, eyes and ENT: Nontraumatic and normocephalic. Pupils equally reacting to light and accommodation. Oral mucosa moist.  Neck: Supple. No JVD, carotid bruit or thyromegaly.  Lymphatics: No lymphadenopathy.  Respiratory system: Clear to auscultation. No increased work of breathing.  Cardiovascular system: S1 and S2 heard, RRR. No JVD, murmurs, gallops, clicks or pedal edema.  Gastrointestinal system: Abdomen is nondistended, soft and nontender. Normal bowel sounds heard. No organomegaly or masses appreciated.  Central nervous system: Alert and oriented. No focal neurological deficits.  Extremities: Symmetric 5 x 5 power. Peripheral pulses symmetrically felt.   Skin: No rashes or acute findings.  Musculoskeletal system: Negative exam.  Psychiatry: Pleasant and cooperative.   Labs on Admission:  Basic Metabolic  Panel: Recent Labs  Lab 06/18/20 0300 06/18/20 0813  NA 124*  --   K 3.2*  --   CL 81*  --   CO2 23  --   GLUCOSE 176*  --   BUN 35*  --   CREATININE 3.40*  --   CALCIUM 9.5  --   MG  --  1.8   Liver Function Tests: Recent Labs  Lab 06/18/20 0736  AST 67*  ALT 29  ALKPHOS 110  BILITOT 1.1  PROT 10.1*  ALBUMIN 4.8   Recent Labs  Lab 06/18/20 0736  LIPASE 35   Recent Labs  Lab 06/18/20 0813  AMMONIA 20   CBC: Recent Labs  Lab 06/18/20 0300  WBC 7.8  HGB 15.4  HCT 43.5  MCV 87.2  PLT 249   Cardiac Enzymes: No results for input(s): CKTOTAL, CKMB, CKMBINDEX, TROPONINI in the last 168 hours.  BNP (last 3 results) No results for input(s): PROBNP in the last 8760 hours. CBG: No results for input(s): GLUCAP in the last 168 hours.  Radiological Exams on Admission: DG Chest Port 1 View  Result Date: 06/18/2020 CLINICAL DATA:  Cough EXAM: PORTABLE CHEST 1 VIEW COMPARISON:  04/20/2020 chest radiograph and prior. FINDINGS: Cardiomediastinal silhouette within normal limits. Both lungs are clear. No pneumothorax or pleural effusion. No acute osseous abnormality. IMPRESSION: No focal airspace disease. Electronically Signed   By: Stana Bunting M.D.   On: 06/18/2020 08:33    EKG: Independently reviewed.  Sinus rhythm normal intervals normal axis J-point elevation non-T wave abnormalities.  Assessment/Plan Acute kidney injury: Likely prerenal azotemia in the setting of binge drinking, he denies any abdominal pain, 2 episodes of vomiting but his lipase is normal as well as his alkaline phosphatase. CK, he has not received contrast admit ice taking any NSAIDs. His baseline creatinine is less than 1, we will hydrate him aggressively, check urinary sodium urinary creatinine and recheck a basic metabolic panel in the morning. Sepsis has been ruled out his lactic acidosis is due to hypovolemia. Had a small amount of urine after I examined him and it was significantly  dark and it was about 200 cc.  High anion gap metabolic acidosis: Likely due to lactic acidosis in the setting of hypovolemia hydrate him aggressively issue resolved. He denies consumption of any other alcohol or suicidal ideations.  Alcohol use: We will start him on thiamine and folate. Unlikely to develop withdrawals, his last drink was more than a week ago.  Hypokalemia: Replete orally recheck in the morning.  Likely due to vomiting and hypovolemia.  Essential hypertension: Blood pressure significantly elevated resume metoprolol and diltiazem.  Paroxysmal atrial fibrillation: In sinus rhythm not on anticoagulation. Continue metoprolol and diltiazem.  Hypovolemic hyponatremia: Hydrate him aggressively recheck a basic metabolic panel this afternoon.   DVT Prophylaxis: lovenox Code Status: Full  Family Communication: None Disposition Plan: inpatient       It is my clinical opinion that admission to INPATIENT is reasonable and necessary in this 44 y.o. male past medical history of alcohol abuse comes in acute kidney injury with a baseline creatinine of less than 1 on admission 3.4, hydrating aggressively recheck in the morning.  Given the aforementioned, the predictability of an adverse outcome is felt to be significant. I expect that the patient will require at least 2 midnights in the hospital to treat this condition.  Marinda Elk MD [facility]   06/18/2020, 11:10 AM

## 2020-06-18 NOTE — ED Notes (Signed)
Bladder scan results: 18 mL.

## 2020-06-18 NOTE — ED Notes (Signed)
Save gold and blue in main lab

## 2020-06-18 NOTE — ED Notes (Signed)
Pt reports he has not taken Cardizem since being in the hospital last time. Pt unsure as to why he is supposed to be taking this medication. Hospitalist paged for further direction.

## 2020-06-18 NOTE — ED Triage Notes (Addendum)
Patient states that she has been throwing up since Thursday. Patient states he has not urinated since Thursday. Patient was bladder scanned and only had 18 ml in his bladder. Patient is not complaining of a full bladder. Patient is a heavy drinker per patient.

## 2020-06-19 DIAGNOSIS — E860 Dehydration: Secondary | ICD-10-CM

## 2020-06-19 LAB — RAPID URINE DRUG SCREEN, HOSP PERFORMED
Amphetamines: NOT DETECTED
Barbiturates: NOT DETECTED
Benzodiazepines: NOT DETECTED
Cocaine: NOT DETECTED
Opiates: NOT DETECTED
Tetrahydrocannabinol: POSITIVE — AB

## 2020-06-19 LAB — CBC
HCT: 35 % — ABNORMAL LOW (ref 39.0–52.0)
Hemoglobin: 11.9 g/dL — ABNORMAL LOW (ref 13.0–17.0)
MCH: 30.7 pg (ref 26.0–34.0)
MCHC: 34 g/dL (ref 30.0–36.0)
MCV: 90.2 fL (ref 80.0–100.0)
Platelets: 197 10*3/uL (ref 150–400)
RBC: 3.88 MIL/uL — ABNORMAL LOW (ref 4.22–5.81)
RDW: 13 % (ref 11.5–15.5)
WBC: 4.9 10*3/uL (ref 4.0–10.5)
nRBC: 0 % (ref 0.0–0.2)

## 2020-06-19 LAB — BASIC METABOLIC PANEL
Anion gap: 10 (ref 5–15)
BUN: 25 mg/dL — ABNORMAL HIGH (ref 6–20)
CO2: 25 mmol/L (ref 22–32)
Calcium: 8.1 mg/dL — ABNORMAL LOW (ref 8.9–10.3)
Chloride: 93 mmol/L — ABNORMAL LOW (ref 98–111)
Creatinine, Ser: 1.23 mg/dL (ref 0.61–1.24)
GFR calc Af Amer: >60 mL/min (ref >60)
GFR calc non Af Amer: >60 mL/min (ref >60)
Glucose, Bld: 120 mg/dL — ABNORMAL HIGH (ref 70–99)
Potassium: 3 mmol/L — ABNORMAL LOW (ref 3.5–5.1)
Sodium: 128 mmol/L — ABNORMAL LOW (ref 135–145)

## 2020-06-19 LAB — HIV ANTIBODY (ROUTINE TESTING W REFLEX): HIV Screen 4th Generation wRfx: NONREACTIVE

## 2020-06-19 MED ORDER — POTASSIUM CHLORIDE CRYS ER 20 MEQ PO TBCR
40.0000 meq | EXTENDED_RELEASE_TABLET | Freq: Two times a day (BID) | ORAL | Status: AC
  Administered 2020-06-19 (×2): 40 meq via ORAL
  Filled 2020-06-19 (×2): qty 2

## 2020-06-19 MED ORDER — SUCRALFATE 1 GM/10ML PO SUSP
1.0000 g | Freq: Three times a day (TID) | ORAL | Status: DC
  Administered 2020-06-19 – 2020-06-21 (×7): 1 g via ORAL
  Filled 2020-06-19 (×8): qty 10

## 2020-06-19 MED ORDER — SODIUM CHLORIDE 0.9 % IV SOLN: INTRAVENOUS | Status: DC

## 2020-06-19 MED ORDER — ALUM & MAG HYDROXIDE-SIMETH 200-200-20 MG/5ML PO SUSP
30.0000 mL | ORAL | Status: DC | PRN
  Administered 2020-06-19 (×2): 30 mL via ORAL
  Filled 2020-06-19 (×2): qty 30

## 2020-06-19 NOTE — Progress Notes (Signed)
Responded to consult for IV. Pt requested to shower before new IV is placed. Notified RN who will place a new consult when pt is done with shower.
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
## 2020-06-19 NOTE — Progress Notes (Signed)
Patient ID: Richard Lopez, male   DOB: 10-31-1976, 44 y.o.   MRN: [id number]   Brief GI note  Contacted by Dr. Ashley Royalty, potential need for EGD. Patient with history of gastritis and esophagitis, had EGD 08/2019.  Recurrent nausea vomiting epigastric pain in setting of EtOH abuse  GI will see patient in a.m. Orders placed for n.p.o. after midnight, and tentatively scheduled for EGD tomorrow afternoon with Dr. Orvan Falconer.

## 2020-06-20 ENCOUNTER — Encounter (HOSPITAL_COMMUNITY)
Admission: EM | Disposition: A | Discharge status: Home / Self Care | Payer: Self-pay | Source: Home / Self Care | Attending: Internal Medicine

## 2020-06-20 DIAGNOSIS — R112 Nausea with vomiting, unspecified: Secondary | ICD-10-CM

## 2020-06-20 DIAGNOSIS — D649 Anemia, unspecified: Secondary | ICD-10-CM

## 2020-06-20 DIAGNOSIS — E876 Hypokalemia: Secondary | ICD-10-CM

## 2020-06-20 DIAGNOSIS — R7401 Elevation of levels of liver transaminase levels: Secondary | ICD-10-CM

## 2020-06-20 DIAGNOSIS — I480 Paroxysmal atrial fibrillation: Secondary | ICD-10-CM

## 2020-06-20 DIAGNOSIS — Z7289 Other problems related to lifestyle: Secondary | ICD-10-CM

## 2020-06-20 LAB — CBC WITH DIFFERENTIAL/PLATELET
Abs Immature Granulocytes: 0 10*3/uL (ref 0.00–0.07)
Basophils Absolute: 0 10*3/uL (ref 0.0–0.1)
Basophils Relative: 1 %
Eosinophils Absolute: 0 10*3/uL (ref 0.0–0.5)
Eosinophils Relative: 1 %
HCT: 41.7 % (ref 39.0–52.0)
Hemoglobin: 13.7 g/dL (ref 13.0–17.0)
Immature Granulocytes: 0 %
Lymphocytes Relative: 42 %
Lymphs Abs: 1.9 10*3/uL (ref 0.7–4.0)
MCH: 31.4 pg (ref 26.0–34.0)
MCHC: 32.9 g/dL (ref 30.0–36.0)
MCV: 95.4 fL (ref 80.0–100.0)
Monocytes Absolute: 0.5 10*3/uL (ref 0.1–1.0)
Monocytes Relative: 12 %
Neutro Abs: 2 10*3/uL (ref 1.7–7.7)
Neutrophils Relative %: 44 %
Platelets: 211 10*3/uL (ref 150–400)
RBC: 4.37 MIL/uL (ref 4.22–5.81)
RDW: 13.4 % (ref 11.5–15.5)
WBC: 4.5 10*3/uL (ref 4.0–10.5)
nRBC: 0 % (ref 0.0–0.2)

## 2020-06-20 LAB — BASIC METABOLIC PANEL
Anion gap: 9 (ref 5–15)
BUN: 13 mg/dL (ref 6–20)
CO2: 22 mmol/L (ref 22–32)
Calcium: 7.8 mg/dL — ABNORMAL LOW (ref 8.9–10.3)
Chloride: 99 mmol/L (ref 98–111)
Creatinine, Ser: 0.88 mg/dL (ref 0.61–1.24)
GFR calc Af Amer: >60 mL/min (ref >60)
GFR calc non Af Amer: >60 mL/min (ref >60)
Glucose, Bld: 101 mg/dL — ABNORMAL HIGH (ref 70–99)
Potassium: 3.9 mmol/L (ref 3.5–5.1)
Sodium: 130 mmol/L — ABNORMAL LOW (ref 135–145)

## 2020-06-20 LAB — IRON AND TIBC
Iron: 45 ug/dL (ref 45–182)
Saturation Ratios: 17 % — ABNORMAL LOW (ref 17.9–39.5)
TIBC: 269 ug/dL (ref 250–450)
UIBC: 224 ug/dL

## 2020-06-20 LAB — HEPATIC FUNCTION PANEL
ALT: 26 U/L (ref 0–44)
AST: 46 U/L — ABNORMAL HIGH (ref 15–41)
Albumin: 3 g/dL — ABNORMAL LOW (ref 3.5–5.0)
Alkaline Phosphatase: 65 U/L (ref 38–126)
Bilirubin, Direct: 0.1 mg/dL (ref 0.0–0.2)
Indirect Bilirubin: 0.3 mg/dL (ref 0.3–0.9)
Total Bilirubin: 0.4 mg/dL (ref 0.3–1.2)
Total Protein: 6 g/dL — ABNORMAL LOW (ref 6.5–8.1)

## 2020-06-20 LAB — FERRITIN: Ferritin: 300 ng/mL (ref 24–336)

## 2020-06-20 LAB — FOLATE: Folate: 10.9 ng/mL (ref >5.9)

## 2020-06-20 LAB — VITAMIN B12: Vitamin B-12: 570 pg/mL (ref 180–914)

## 2020-06-20 SURGERY — ESOPHAGOGASTRODUODENOSCOPY (EGD) WITH PROPOFOL
Anesthesia: Monitor Anesthesia Care

## 2020-06-20 MED ORDER — THIAMINE HCL 100 MG PO TABS
100.0000 mg | ORAL_TABLET | Freq: Every day | ORAL | Status: DC
  Administered 2020-06-20 – 2020-06-21 (×2): 100 mg via ORAL
  Filled 2020-06-20 (×2): qty 1

## 2020-06-20 MED ORDER — SODIUM CHLORIDE 0.9 % IV SOLN: INTRAVENOUS | Status: AC

## 2020-06-20 MED ORDER — HYDROXYZINE HCL 25 MG PO TABS: 25.0000 mg | ORAL_TABLET | Freq: Three times a day (TID) | ORAL | Status: DC | PRN

## 2020-06-20 MED ORDER — FOLIC ACID 1 MG PO TABS
1.0000 mg | ORAL_TABLET | Freq: Every day | ORAL | Status: DC
  Administered 2020-06-20 – 2020-06-21 (×2): 1 mg via ORAL
  Filled 2020-06-20 (×2): qty 1

## 2020-06-20 NOTE — Consult Note (Signed)
Referring Provider: Dr. Landis Gandy  Primary Care Physician:  Kerin Perna, NP Primary Gastroenterologist:  Althia Forts   Reason for Consultation: Nausea and vomiting   HPI: Richard Lopez is a 44 y.o. male with a past medical history of anxiety, depression, alcohol abuse, hypertension, atrial fibrillation, provoked PE 07/2019 no longer on Eliquis, cavitary lung lesion, vitamin D deficiency, anemia and UGI bleed 08/2019.   He was admitted to the hospital 04/17/2020 with delirium tremens, aspiration pneumonia and bilateral nee noninfectious arthritis.  SARS coronavirus 19 negative. He was in the ICU due to severe alcohol withdrawal and he was placed on Dexmedetomidine infusion. His developed atrial fibrillation with RVR which was controlled on Metoprolol and Diltiazem.  Anticoagulation was deferred due to history of frequent falls associated with ongoing alcohol use. He was placed on ASA. His pneumonia improved on antibiotics and neurostatus stabilized. He was discharged  Home on 04/23/2020.   He presented to [facility] ED on 06/18/2020 with N/V and no urine output x 3 days. Labs in the ED showed Na+ 124. K+ 3.2. BUN 35. Cr. 3.40.  Alk phos 110.  AST 67.  ALT 29.  Total bili 1.1.  Direct bili 0.3.  Lipase 35.  Lactic Acid 2.1.  WBC 7.8.  Hemoglobin 15.4.  Hematocrit 43.5.  MCV 87.2.  Platelet 249.  Alcohol level < 10. A bladder scan showed 10m of urine in the bladder.  SARS coronavirus 19 negative.   He reported feeling fairly well until Thursday 06/15/2019. While sitting on his porch he develops nausea with vomiting. His emesis was described as nonbloody watery acid type secretions. He vomited 3 to 5 times on Thurs and Fri x 3. He had esophageal and epigastric burning after vomiting. No severe abdominal pain. Normal BMs. No rectal bleeding or melena. His last drank 2 or 3 beers on 8/10. A GI consult was requested for further evaluation and possible EGD. He has a history of an UGI  bleed 08/13/2019 with associated excessive alcohol intake.  He was on Eliquis at that time due to a provoked PE secondary to a LE fracture 07/2019.  At that time his admission Hg 12.9 dropped to 10.4.  He underwent an EGD by Dr. SCari Carawaywhich showed erosive ulcerative esophagitis without evidence of bleeding.  He initially received IV PPI then was transitioned to pantoprazole 40 mg p.o. twice daily. He did not follow up as an outpatient with Dr. SLadonna Snide    EGD due to UGI bleed/hematemesis by Dr. SMichail Sermon10/09/2019: - LA Grade C reflux esophagitis. - LA Grade B reflux esophagitis. - Acute gastritis. - Small hiatal hernia. - Normal examined duodenum. - Esophageal mucosal changes suspicious for eosinophilic esophagitis. - No specimens collected.   Past Medical History:  Diagnosis Date  . Anxiety   . 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 Depression   . Diabetes mellitus    diet controlled  . Essential hypertension   . Gastritis   . Gout   . Normocytic anemia due to blood loss 08/21/2019  . Pulmonary embolism ([facility]     Past Surgical History:  Procedure Laterality Date  . ESOPHAGOGASTRODUODENOSCOPY (EGD) WITH PROPOFOL N/A 08/14/2019   Procedure: ESOPHAGOGASTRODUODENOSCOPY (EGD) WITH PROPOFOL;  Surgeon: SWilford Corner MD;  Location: [facility];  Service: Endoscopy;  Laterality: N/A;  . EXTERNAL FIXATION LEG Left 11/07/2018   Procedure: EXTERNAL FIXATION LEFT LOWER LEG;  Surgeon: SRod Can MD;  Location: [facility];  Service: Orthopedics;  Laterality:  Left;  . EXTERNAL FIXATION REMOVAL Left 11/08/2018   Procedure: REMOVAL EXTERNAL FIXATION LEG;  Surgeon: Shona Needles, MD;  Location: [place];  Service: Orthopedics;  Laterality: Left;  . INTRAMEDULLARY (IM) NAIL INTERTROCHANTERIC Right 03/26/2019   Procedure: INTRAMEDULLARY (IM) NAIL INTERTROCHANTRIC;  Surgeon: Shona Needles, MD;  Location: [place];  Service: Orthopedics;  Laterality: Right;  . INTRAMEDULLARY (IM) NAIL  INTERTROCHANTERIC Right 05/17/2019   Procedure: Intramedullary (Im) Nail Intertroch with circlage wiring;  Surgeon: Altamese [place], MD;  Location: [place];  Service: Orthopedics;  Laterality: Right;  . NO PAST SURGERIES    . OPEN REDUCTION INTERNAL FIXATION (ORIF) TIBIA/FIBULA FRACTURE Left 11/08/2018   Procedure: OPEN REDUCTION INTERNAL FIXATION (ORIF) TIBIA/FIBULA FRACTURE;  Surgeon: Shona Needles, MD;  Location: [place];  Service: Orthopedics;  Laterality: Left;  . ORIF FEMUR FRACTURE Right 05/17/2019   Procedure: REMOVAL  OF HARDWARE;  Surgeon: Altamese Byron, MD;  Location: [place];  Service: Orthopedics;  Laterality: Right;    Prior to Admission medications   Medication Sig Start Date End Date Taking? Authorizing Provider  diclofenac Sodium (VOLTAREN) 1 % GEL Apply 2 g topically in the morning, at noon, and at bedtime. Apply to both knees. Patient taking differently: Apply 2 g topically 3 (three) times daily as needed (pain). Apply to both knees. 04/23/20  Yes Arrien, Jimmy Picket, MD  polyvinyl alcohol (LIQUIFILM TEARS) 1.4 % ophthalmic solution Place 1 drop into both eyes as needed for dry eyes.   Yes [provider]  acetaminophen (TYLENOL) 325 MG tablet Take 2 tablets (650 mg total) by mouth every 6 (six) hours. Patient not taking: Reported on 06/18/2020 04/23/20   Arrien, Jimmy Picket, MD  aspirin EC 81 MG EC tablet Take 1 tablet (81 mg total) by mouth daily. Swallow whole. Patient not taking: Reported on 06/18/2020 04/23/20   Arrien, Jimmy Picket, MD  diltiazem (CARDIZEM CD) 120 MG 24 hr capsule Take 1 capsule (120 mg total) by mouth daily. First down 04/23/20 at 6:00 pm. Patient not taking: Reported on 06/18/2020 05/09/20 06/08/20  Kerin Perna, NP  Metoprolol Tartrate 75 MG TABS Take 75 mg by mouth 2 (two) times daily. Patient not taking: Reported on 06/18/2020 05/09/20 06/08/20  Kerin Perna, NP    Current Facility-Administered Medications  Medication Dose Route  Frequency Provider Last Rate Last Admin  . 0.9 %  sodium chloride infusion   Intravenous Continuous Georgette Shell, MD   Paused at 06/20/20 0440  . acetaminophen (TYLENOL) tablet 650 mg  650 mg Oral Q6H Charlynne Cousins, MD   650 mg at 06/20/20 0030  . alum & mag hydroxide-simeth (MAALOX/MYLANTA) 200-200-20 MG/5ML suspension 30 mL  30 mL Oral Q4H PRN Georgette Shell, MD   30 mL at 06/19/20 1533  . aspirin EC tablet 81 mg  81 mg Oral Daily Charlynne Cousins, MD   81 mg at 06/19/20 0738  . diltiazem (CARDIZEM CD) 24 hr capsule 120 mg  120 mg Oral Daily Charlynne Cousins, MD   120 mg at 06/19/20 0738  . enoxaparin (LOVENOX) injection 40 mg  40 mg Subcutaneous Q24H Charlynne Cousins, MD   40 mg at 06/19/20 2130  . metoprolol tartrate (LOPRESSOR) tablet 75 mg  75 mg Oral BID Charlynne Cousins, MD   75 mg at 06/19/20 2128  . ondansetron (ZOFRAN) tablet 4 mg  4 mg Oral Q6H PRN Charlynne Cousins, MD       Or  .  ondansetron (ZOFRAN) injection 4 mg  4 mg Intravenous Q6H PRN Charlynne Cousins, MD   4 mg at 06/18/20 2112  . pantoprazole (PROTONIX) EC tablet 40 mg  40 mg Oral BID Charlynne Cousins, MD   40 mg at 06/19/20 2128  . polyethylene glycol (MIRALAX / GLYCOLAX) packet 17 g  17 g Oral BID Charlynne Cousins, MD   17 g at 06/19/20 2128  . sucralfate (CARAFATE) 1 GM/10ML suspension 1 g  1 g Oral TID WC & HS Georgette Shell, MD   1 g at 06/19/20 2128  . traMADol (ULTRAM) tablet 50 mg  50 mg Oral Q6H PRN Lang Snow, FNP   50 mg at 06/18/20 2156    Allergies as of 06/18/2020  . (No Known Allergies)    Family History  Problem Relation Age of Onset  . Diabetes Mellitus II Father   . Diabetes Mellitus II Other   . CAD Other     Social History   Socioeconomic History  . Marital status: Single    Spouse name: Not on file  . Number of children: Not on file  . Years of education: Not on file  . Highest education level: Some college, no degree    Occupational History  . Not on file  Tobacco Use  . Smoking status: Former Smoker    Packs/day: 0.00    Types: Cigarettes  . Smokeless tobacco: Never Used  . Tobacco comment: About 1 cigarette per day or less  Vaping Use  . Vaping Use: Never used  Substance and Sexual Activity  . Alcohol use: Yes    Alcohol/week: 200.0 standard drinks    Types: 200 Cans of beer per week  . Drug use: Yes    Types: Marijuana  . Sexual activity: Yes  Other Topics Concern  . Not on file  Social History Narrative  . Not on file   Social Determinants of Health   Financial Resource Strain:   . Difficulty of Paying Living Expenses:   Food Insecurity:   . Worried About Charity fundraiser in the Last Year:   . Arboriculturist in the Last Year:   Transportation Needs:   . Film/video editor (Medical):   [name] Lack of Transportation (Non-Medical):   Physical Activity:   . Days of Exercise per Week:   . Minutes of Exercise per Session:   Stress:   . Feeling of Stress :   Social Connections:   . Frequency of Communication with Friends and Family:   . Frequency of Social Gatherings with Friends and Family:   . Attends Religious Services:   . Active Member of Clubs or Organizations:   . Attends Archivist Meetings:   [name] Marital Status:   Intimate Partner Violence:   . Fear of Current or Ex-Partner:   . Emotionally Abused:   [name] Physically Abused:   . Sexually Abused:     Review of Systems:  See HPI, all other systems reviewed and are negative   Physical Exam: Vital signs in last 24 hours: Temp:  [97.5 F (36.4 C)-98.3 F (36.8 C)] 98.3 F (36.8 C) (08/17 2119) Pulse Rate:  [53-70] 61 (08/17 2119) Resp:  [14-16] 16 (08/17 2119) BP: (121-152)/(88-92) 135/91 (08/17 2119) SpO2:  [100 %] 100 % (08/17 2119) Last BM Date: 06/19/20 General:  Alert 44 year old male in NAD.  Head:  Normocephalic and atraumatic. Eyes:  No scleral icterus. Conjunctiva pink. Ears:  Normal  auditory  acuity. Nose:  No deformity, discharge or lesions. Mouth:  Three loose teeth to the front lower mandible. Partial plate in use. No ulcers or lesions.  Neck:  Supple. No lymphadenopathy or thyromegaly.  Lungs:  Breath sounds clear throughout.  Heart:  RRR, no murmur.  Abdomen:  Soft, mild generalized tenderness without rebound or guarding. Nondistended. No HSM. + BS x 4quads.  Rectal: Deferred. Musculoskeletal:  Symmetrical without gross deformities.  Pulses:  Normal pulses noted. Extremities:  LLE deformity with ankle edema.  Neurologic:  Alert and  oriented x4. No focal deficits.  Skin:  Intact without significant lesions or rashes. Psych:  Alert and cooperative. Normal mood and affect.  Intake/Output from previous day: 08/17 0701 - 08/18 0700 In: 3802 [P.O.:2280; I.V.:1522] Out: 300 [Urine:300] Intake/Output this shift: Total I/O In: 1340 [P.O.:720; I.V.:620] Out: 300 [Urine:300]  Lab Results: Recent Labs    06/18/20 0300 06/19/20 0255  WBC 7.8 4.9  HGB 15.4 11.9*  HCT 43.5 35.0*  PLT 249 197   BMET Recent Labs    06/18/20 1958 06/19/20 0255 06/20/20 0246  NA 128* 128* 130*  K 3.5 3.0* 3.9  CL 89* 93* 99  CO2 '25 25 22  ' GLUCOSE 106* 120* 101*  BUN 27* 25* 13  CREATININE 1.80* 1.23 0.88  CALCIUM 8.4* 8.1* 7.8*   LFT Recent Labs    06/18/20 0736  PROT 10.1*  ALBUMIN 4.8  AST 67*  ALT 29  ALKPHOS 110  BILITOT 1.1  BILIDIR 0.3*  IBILI 0.8   PT/INR No results for input(s): LABPROT, INR in the last 72 hours. Hepatitis Panel No results for input(s): HEPBSAG, HCVAB, HEPAIGM, HEPBIGM in the last 72 hours.    Studies/Results: DG Chest Port 1 View  Result Date: 06/18/2020 CLINICAL DATA:  Cough EXAM: PORTABLE CHEST 1 VIEW COMPARISON:  04/20/2020 chest radiograph and prior. FINDINGS: Cardiomediastinal silhouette within normal limits. Both lungs are clear. No pneumothorax or pleural effusion. No acute osseous abnormality. IMPRESSION: No focal airspace  disease. Electronically Signed   By: Primitivo Gauze M.D.   On: 06/18/2020 08:33    IMPRESSION/PLAN:  8. 44 year old male with a history of alcohol and polysubstance abuse admitted to the hospital with N/V most likely due to alcohol abuse in setting of AKI.  Admission Hg 15.4  -> 11.9 (dilutional).  HCT 35.0. MCV 90.2. Plan was for EGD later today, however, patient refused EGD due to having 3 loose teeth, he does not wish to risk losing any teeth due to EGD.  -Soft diet -Cancel EGD -EGD as outpatient when dental status stable -IVF per the hospitalist -PPI po bid -Zofran 61m IV or po Q 6 hours PRN -Repeat CBC in am with iron panel and B12, folate levels  2. Acute on chronic anemia. History of UGI bleed s/p EGD  08/14/2019 showed ulcerative esophagitis without evidence of active bleeding.  Admission Hg 15.4 -> 11.9 most likely dilutional (Hg 9.7 on 04/22/2020). No evidence of over GI bleeding.  -Continue Pantoprazole 443mpo bid  -See Plan in # 1  3. Elevated AST most likely due to alcohol abuse. AST 67. ALT 29. Normal T. Bili and alk phos levels.  -Abdominal sonogram   -Acute hepatitis panel, hepatic panel, GGT, PT/INR -No alcohol -CIWA protocol   4. History of atrial fibrillation with RVR on ASA, Metoprolol and Cardizem.   5. DM II  6. History of PE 07/2019 on ASA  Further recommendations per Dr. MaRush Landmark  Richard Lopez  06/20/2020, 6:04 AM
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
## 2020-06-21 ENCOUNTER — Inpatient Hospital Stay (HOSPITAL_COMMUNITY): Payer: Self-pay

## 2020-06-21 LAB — CBC WITH DIFFERENTIAL/PLATELET
Abs Immature Granulocytes: 0.01 10*3/uL (ref 0.00–0.07)
Basophils Absolute: 0 10*3/uL (ref 0.0–0.1)
Basophils Relative: 1 %
Eosinophils Absolute: 0.1 10*3/uL (ref 0.0–0.5)
Eosinophils Relative: 1 %
HCT: 34.1 % — ABNORMAL LOW (ref 39.0–52.0)
Hemoglobin: 11 g/dL — ABNORMAL LOW (ref 13.0–17.0)
Immature Granulocytes: 0 %
Lymphocytes Relative: 55 %
Lymphs Abs: 2.3 10*3/uL (ref 0.7–4.0)
MCH: 30.4 pg (ref 26.0–34.0)
MCHC: 32.3 g/dL (ref 30.0–36.0)
MCV: 94.2 fL (ref 80.0–100.0)
Monocytes Absolute: 0.6 10*3/uL (ref 0.1–1.0)
Monocytes Relative: 15 %
Neutro Abs: 1.2 10*3/uL — ABNORMAL LOW (ref 1.7–7.7)
Neutrophils Relative %: 28 %
Platelets: 193 10*3/uL (ref 150–400)
RBC: 3.62 MIL/uL — ABNORMAL LOW (ref 4.22–5.81)
RDW: 13.3 % (ref 11.5–15.5)
WBC: 4.3 10*3/uL (ref 4.0–10.5)
nRBC: 0 % (ref 0.0–0.2)

## 2020-06-21 LAB — BASIC METABOLIC PANEL
Anion gap: 9 (ref 5–15)
BUN: 8 mg/dL (ref 6–20)
CO2: 22 mmol/L (ref 22–32)
Calcium: 8 mg/dL — ABNORMAL LOW (ref 8.9–10.3)
Chloride: 103 mmol/L (ref 98–111)
Creatinine, Ser: 0.8 mg/dL (ref 0.61–1.24)
GFR calc Af Amer: >60 mL/min (ref >60)
GFR calc non Af Amer: >60 mL/min (ref >60)
Glucose, Bld: 85 mg/dL (ref 70–99)
Potassium: 3.9 mmol/L (ref 3.5–5.1)
Sodium: 134 mmol/L — ABNORMAL LOW (ref 135–145)

## 2020-06-21 LAB — HEPATIC FUNCTION PANEL
ALT: 24 U/L (ref 0–44)
AST: 38 U/L (ref 15–41)
Albumin: 3 g/dL — ABNORMAL LOW (ref 3.5–5.0)
Alkaline Phosphatase: 77 U/L (ref 38–126)
Bilirubin, Direct: 0.1 mg/dL (ref 0.0–0.2)
Indirect Bilirubin: 0.3 mg/dL (ref 0.3–0.9)
Total Bilirubin: 0.4 mg/dL (ref 0.3–1.2)
Total Protein: 5.9 g/dL — ABNORMAL LOW (ref 6.5–8.1)

## 2020-06-21 LAB — CORTISOL: Cortisol, Plasma: 5.8 ug/dL

## 2020-06-21 LAB — HEPATITIS PANEL, ACUTE
HCV Ab: NONREACTIVE
Hep A IgM: NONREACTIVE
Hep B C IgM: NONREACTIVE
Hepatitis B Surface Ag: NONREACTIVE

## 2020-06-21 LAB — MAGNESIUM: Magnesium: 1.6 mg/dL — ABNORMAL LOW (ref 1.7–2.4)

## 2020-06-21 MED ORDER — PANTOPRAZOLE SODIUM 40 MG PO TBEC: 40.0000 mg | DELAYED_RELEASE_TABLET | Freq: Two times a day (BID) | ORAL | 1 refills | Status: DC

## 2020-06-21 MED ORDER — METOPROLOL TARTRATE 75 MG PO TABS
75.0000 mg | ORAL_TABLET | Freq: Two times a day (BID) | ORAL | 1 refills | Status: DC
Start: 2020-06-21 — End: 2020-10-24

## 2020-06-21 MED ORDER — HYDROXYZINE HCL 25 MG PO TABS: 25.0000 mg | ORAL_TABLET | Freq: Three times a day (TID) | ORAL | 0 refills | Status: DC | PRN

## 2020-06-21 MED ORDER — THIAMINE HCL 100 MG PO TABS: 100.0000 mg | ORAL_TABLET | Freq: Every day | ORAL | Status: DC

## 2020-06-21 MED ORDER — MAGNESIUM SULFATE 4 GM/100ML IV SOLN
4.0000 g | Freq: Once | INTRAVENOUS | Status: AC
  Administered 2020-06-21: 4 g via INTRAVENOUS
  Filled 2020-06-21: qty 100

## 2020-06-21 MED ORDER — FOLIC ACID 1 MG PO TABS: 1.0000 mg | ORAL_TABLET | Freq: Every day | ORAL | Status: DC

## 2020-06-21 MED ORDER — SUCRALFATE 1 GM/10ML PO SUSP: 1.0000 g | Freq: Three times a day (TID) | ORAL | 0 refills | Status: DC

## 2020-06-21 MED ORDER — DILTIAZEM HCL ER COATED BEADS 120 MG PO CP24: 120.0000 mg | ORAL_CAPSULE | Freq: Every day | ORAL | 1 refills | Status: DC

## 2020-06-21 NOTE — Discharge Summary (Signed)
Physician Discharge Summary  ZELIG DOOLAN HQP:[id number] DOB: January 24, 1976 DOA: 06/18/2020  PCP: [name], NP  Admit date: 06/18/2020 Discharge date: 06/21/2020  Time spent: 60 minutes  Recommendations for Outpatient Follow-up:  1. Follow-up with [name], NP in 2 to 3 weeks.  On follow-up patient need a comprehensive metabolic profile done to follow-up on electrolytes and renal function and LFTs.  Patient also need a magnesium level checked.  Patient also need a CBC done to follow-up on H&H.   Discharge Diagnoses:  Principal Problem:   Nausea with vomiting Active Problems:   Essential hypertension   High anion gap metabolic acidosis   Alcohol use   Hypokalemia   Hypomagnesemia   Anemia   Hyponatremia   Atrial fibrillation with RVR (HCC)   AKI (acute kidney injury) (HCC)   Dehydration   AF (paroxysmal atrial fibrillation) (HCC)   Transaminitis   Discharge Condition: Stable and improved  Diet recommendation: Heart healthy  Filed Weights   06/18/20 0219  Weight: 97.1 kg    History of present illness:  HPI per Dr. David Stall  Richard Lopez is a 44 y.o. male past medical history of PE and heavy alcohol abuse gastritis and GI bleed due to gastric ulcer, who presented to the ED complaining of being anuric, he related he went binge drinking on 06/10/2020 his last drink was on 06/12/2020 he did not have any abdominal pain he did have 1 or 2 episodes of vomiting, but he last urinated on Thursday night, he related that that his urine was dark, woke up Friday and throughout the whole day and whole weekend he did not urinate, he denies any fever, or diarrhea.  Does not consume any other alcohol or has an intention of killing himself or damaging himself usually drinks beer has been drinking a lot of water and Pedialyte and still has not urinated.  Is no new medications or changes in his medication.  In the ED: He was found to be significantly tachycardic vitals are  stable, he he denies any dizziness upon standing afebrile, hemic with new acute renal failure elevated blood glucose of 176 anion gap of 18, AST and ALT as per 2-1 total protein is 10.1, lactic acid of 2.1 no leukocytosis, hemoglobin of 15 is negative alcohol level is less than 10, chest x-ray showed no acute findings.  Hospital Course:  1 intractable nausea vomiting Felt likely secondary to history of polysubstance abuse and alcohol binge in the setting of acute kidney injury.  Patient noted to have hemoglobin of 15.4 on admission down to 11.0 by day of discharge, which could partially be dilutional.  Patient with no significant overt bleeding.  GI consulted and has assessed the patient and were planning on upper endoscopy as patient with prior history of ulcerative esophagitis.  Patient however reluctant to have EGD done due to concerns for losing 3 loose teeth and as such EGD canceled.  Patient started on a soft diet which he tolerated.  Patient had no further nausea or vomiting.  Patient also maintained on a PPI, Carafate, antiemetics.  Patient improved clinically.  Patient be discharged on PPI, Carafate.  Outpatient follow-up with PCP.  May follow-up with GI on a as needed basis.  2.  Acute on chronic anemia Patient with prior history of upper GI bleed status post EGD 08/14/2019 that showed ulcerative esophagitis without evidence of active bleeding.  Hemoglobin noted on admission to be 15.4 and down to 11.9 (06/19/2020).  Hemoglobin  remained stable was 11.0 by day of discharge. Patient with no overt bleeding.  Patient seen in consultation by GI and patient declined EGD due to concerns for losing 3 loose teeth.  Patient with no overt bleeding.  Anemia panel was consistent with anemia of chronic disease.  Patient maintained on a PPI.  Patient was seen in consultation by GI who signed off and recommended patient may follow-up in the outpatient setting as needed.  3.  Transaminitis Likely secondary to  alcohol abuse.    Otitis panel which was obtained was negative.  HIV was nonreactive.  LFTs trended down.  Abdominal ultrasound obtained consistent with hepatic steatosis, cholelithiasis and sludge within the gallbladder with no sonographic evidence of cholecystitis.  Patient was seen by GI.  Outpatient follow-up with PCP.  4.  Acute kidney injury Likely secondary to prerenal azotemia in the setting of alcohol binge.  Renal function improved with hydration and acute kidney injury had resolved by day of discharge.  Patient had good urine output.  Outpatient follow-up.  5.  History of paroxysmal atrial fibrillation with RVR Remained rate controlled and in sinus rhythm during the hospitalization.  Patient was resumed on home regimen of metoprolol and Cardizem.  Patient noted not to be on anticoagulation prior to admission.  Outpatient follow-up.    6.  Hypovolemic hyponatremia Improved with hydration.   7.  Hypertension Patient was placed back on home regimen of metoprolol and Cardizem that he was supposed to be on.  Patient was discharged with a prescription for metoprolol and Cardizem.  Outpatient follow-up with PCP.  8.  Hypokalemia/hypomagnesemia Repleted.  Magnesium repleted on day of discharge.  Outpatient follow-up.   9.  Polysubstance abuse Patient noted to have a UDS positive for [facility] which he reported he takes for pain control.  Polysubstance cessation stressed to patient.    10.  Alcohol use Patient stated he has significantly curtailed his alcohol use since last hospitalization and may be drinks 1 or 2 beers a day.  Stated sometimes he goes 2 to 3 days without any alcohol use.  Patient with no overt signs of alcohol withdrawal.  Patient was placed on thiamine, folic acid, multivitamin.  Also placed on hydroxyzine as needed.  Patient remained in stable condition.  Alcohol cessation stressed to patient.  Outpatient follow-up.   11.  Dehydration Hydrated with IV fluids.  Was  euvolemic by day of discharge.    Procedures:  Chest x-ray 06/18/2020  Abdominal ultrasound 8/19/202 Consultations:  Gastroenterology: Dr. Meridee Score 06/20/2020  Discharge Exam: Vitals:   06/20/20 2150 06/21/20 0618  BP:  121/71  Pulse: 62 (!) 48  Resp:  17  Temp:  98.3 F (36.8 C)  SpO2:  100%    General: NAD Cardiovascular: RRR Respiratory: CTAB  Discharge Instructions   Discharge Instructions    Diet - low sodium heart healthy   Complete by: As directed    Increase activity slowly   Complete by: As directed      Allergies as of 06/21/2020   No Known Allergies     Medication List    TAKE these medications   acetaminophen 325 MG tablet Commonly known as: TYLENOL Take 2 tablets (650 mg total) by mouth every 6 (six) hours.   aspirin 81 MG EC tablet Take 1 tablet (81 mg total) by mouth daily. Swallow whole.   diclofenac Sodium 1 % Gel Commonly known as: VOLTAREN Apply 2 g topically in the morning, at noon, and at bedtime. Apply to  both knees. What changed:   when to take this  reasons to take this   diltiazem 120 MG 24 hr capsule Commonly known as: CARDIZEM CD Take 1 capsule (120 mg total) by mouth daily. What changed: additional instructions   folic acid 1 MG tablet Commonly known as: FOLVITE Take 1 tablet (1 mg total) by mouth daily. Start taking on: June 22, 2020   hydrOXYzine 25 MG tablet Commonly known as: ATARAX/VISTARIL Take 1 tablet (25 mg total) by mouth 3 (three) times daily as needed for itching or anxiety.   Metoprolol Tartrate 75 MG Tabs Take 75 mg by mouth 2 (two) times daily.   pantoprazole 40 MG tablet Commonly known as: PROTONIX Take 1 tablet (40 mg total) by mouth 2 (two) times daily.   polyvinyl alcohol 1.4 % ophthalmic solution Commonly known as: LIQUIFILM TEARS Place 1 drop into both eyes as needed for dry eyes.   sucralfate 1 GM/10ML suspension Commonly known as: CARAFATE Take 10 mLs (1 g total) by mouth 4  (four) times daily -  with meals and at bedtime.   thiamine 100 MG tablet Take 1 tablet (100 mg total) by mouth daily. Start taking on: June 22, 2020      No Known Allergies  Follow-up Information    [name], NP. Schedule an appointment as soon as possible for a visit in 2 week(s).   Specialty: Internal Medicine Why: f/u in 2-3 weeks. Contact information: [address] [place] [address] [phone]                The results of significant diagnostics from this hospitalization (including imaging, microbiology, ancillary and laboratory) are listed below for reference.    Significant Diagnostic Studies: US Abdomen Complete  Result Date: 06/21/2020 CLINICAL DATA:  Abdominal pain EXAM: ABDOMEN ULTRASOUND COMPLETE COMPARISON:  04/12/2019 CT abdomen pelvis and prior. 04/01/2017 abdominal ultrasound and prior. FINDINGS: Gallbladder: Multiple intraluminal stones measuring up to 1 cm with layering sludge. No wall thickening visualized. No sonographic Murphy sign noted by sonographer. Common bile duct: Diameter: 3.8 mm Liver: No focal lesion identified. Increased parenchymal echogenicity. Portal vein is patent on color Doppler imaging with normal direction of blood flow towards the liver. IVC: No abnormality visualized. Pancreas: Visualized portion unremarkable. Spleen: Size and appearance within normal limits. Right Kidney: Length: 12.3 cm. Echogenicity within normal limits. No mass or hydronephrosis visualized. Left Kidney: Length: 11.6 cm. Echogenicity within normal limits. No mass or hydronephrosis visualized. Abdominal aorta: No aneurysm visualized. Other findings: None. IMPRESSION: 1. Cholelithiasis and sludge within the gallbladder. No sonographic evidence of cholecystitis. 2. Hepatic steatosis.  No focal hepatic lesion. 3. Otherwise unremarkable ultrasound of the abdomen. Electronically Signed   By: Stana Bunting M.D.   On: 06/21/2020 09:17   DG Chest Port  1 View  Result Date: 06/18/2020 CLINICAL DATA:  Cough EXAM: PORTABLE CHEST 1 VIEW COMPARISON:  04/20/2020 chest radiograph and prior. FINDINGS: Cardiomediastinal silhouette within normal limits. Both lungs are clear. No pneumothorax or pleural effusion. No acute osseous abnormality. IMPRESSION: No focal airspace disease. Electronically Signed   By: Stana Bunting M.D.   On: 06/18/2020 08:33    Microbiology: Recent Results (from the past 240 hour(s))  SARS Coronavirus 2 by RT PCR (hospital order, performed in [facility] hospital lab) Nasopharyngeal Nasopharyngeal Swab     Status: None   Collection Time: 06/18/20  7:36 AM   Specimen: Nasopharyngeal Swab  Result Value Ref Range Status   SARS Coronavirus 2  NEGATIVE NEGATIVE Final    Comment: (NOTE) SARS-CoV-2 target nucleic acids are NOT DETECTED.  The SARS-CoV-2 RNA is generally detectable in upper and lower respiratory specimens during the acute phase of infection. The lowest concentration of SARS-CoV-2 viral copies this assay can detect is 250 copies / mL. A negative result does not preclude SARS-CoV-2 infection and should not be used as the sole basis for treatment or other patient management decisions.  A negative result may occur with improper specimen collection / handling, submission of specimen other than nasopharyngeal swab, presence of viral mutation(s) within the areas targeted by this assay, and inadequate number of viral copies (<250 copies / mL). A negative result must be combined with clinical observations, patient history, and epidemiological information.  Fact Sheet for Patients:   BoilerBrush.com.cy  Fact Sheet for Healthcare Providers: https://pope.com/  This test is not yet approved or  cleared by the [place] FDA and has been authorized for detection and/or diagnosis of SARS-CoV-2 by FDA under an Emergency Use Authorization (EUA).  This EUA will remain in  effect (meaning this test can be used) for the duration of the COVID-19 declaration under Section 564(b)(1) of the Act, 21 U.S.C. section 360bbb-3(b)(1), unless the authorization is terminated or revoked sooner.  Performed at [facility], [address] [place], [place] [address]      Labs: Basic Metabolic Panel: Recent Labs  Lab 06/18/20 0300 06/18/20 0813 06/18/20 1958 06/19/20 0255 06/20/20 0246 06/21/20 0251  NA 124*  --  128* 128* 130* 134*  K 3.2*  --  3.5 3.0* 3.9 3.9  CL 81*  --  89* 93* 99 103  CO2 23  --  25 25 22 22   GLUCOSE 176*  --  106* 120* 101* 85  BUN 35*  --  27* 25* 13 8  CREATININE 3.40*  --  1.80* 1.23 0.88 0.80  CALCIUM 9.5  --  8.4* 8.1* 7.8* 8.0*  MG  --  1.8  --   --   --  1.6*   Liver Function Tests: Recent Labs  Lab 06/18/20 0736 06/20/20 1112 06/21/20 0251  AST 67* 46* 38  ALT 29 26 24   ALKPHOS 110 65 77  BILITOT 1.1 0.4 0.4  PROT 10.1* 6.0* 5.9*  ALBUMIN 4.8 3.0* 3.0*   Recent Labs  Lab 06/18/20 0736  LIPASE 35   Recent Labs  Lab 06/18/20 0813  AMMONIA 20   CBC: Recent Labs  Lab 06/18/20 0300 06/19/20 0255 06/20/20 1602 06/21/20 0251  WBC 7.8 4.9 4.5 4.3  NEUTROABS  --   --  2.0 1.2*  HGB 15.4 11.9* 13.7 11.0*  HCT 43.5 35.0* 41.7 34.1*  MCV 87.2 90.2 95.4 94.2  PLT 249 197 211 193   Cardiac Enzymes: Recent Labs  Lab 06/18/20 0813  CKTOTAL 352   BNP: BNP (last 3 results) No results for input(s): BNP in the last 8760 hours.  ProBNP (last 3 results) No results for input(s): PROBNP in the last 8760 hours.  CBG: No results for input(s): GLUCAP in the last 168 hours.     Signed:  06/23/20 MD.  [facility] 06/21/2020, 10:58 AM

## 2020-06-22 ENCOUNTER — Telehealth: Payer: Self-pay

## 2020-06-22 NOTE — Telephone Encounter (Signed)
Transition Care Management Follow-up Telephone Call Date of discharge and from where: 06/21/2020, [facility].  Call placed to patient # [phone], unable to leave a message, voicemail not set up.  Patient needs to schedule follow up appointment with PCP

## 2020-06-25 ENCOUNTER — Telehealth: Payer: Self-pay

## 2020-06-25 NOTE — Telephone Encounter (Signed)
Transition Care Management Follow-up Telephone Call Attempt # 2 Date of discharge and from where: 06/21/2020, [facility].  Call placed to patient # [phone], unable to leave a message, voicemail not set up.  Letter sent to patient requesting he call [facility] to schedule a follow up appointment

## 2020-07-16 ENCOUNTER — Encounter (INDEPENDENT_AMBULATORY_CARE_PROVIDER_SITE_OTHER): Payer: Self-pay | Admitting: Primary Care

## 2020-07-16 ENCOUNTER — Other Ambulatory Visit: Payer: Self-pay

## 2020-07-16 ENCOUNTER — Ambulatory Visit (INDEPENDENT_AMBULATORY_CARE_PROVIDER_SITE_OTHER): Payer: Self-pay | Admitting: Primary Care

## 2020-07-16 VITALS — BP 153/89 | HR 91 | Temp 98.2°F | Ht 78.0 in | Wt 224.0 lb

## 2020-07-16 DIAGNOSIS — Z09 Encounter for follow-up examination after completed treatment for conditions other than malignant neoplasm: Secondary | ICD-10-CM

## 2020-07-16 DIAGNOSIS — S72144D Nondisplaced intertrochanteric fracture of right femur, subsequent encounter for closed fracture with routine healing: Secondary | ICD-10-CM

## 2020-07-16 DIAGNOSIS — M151 Heberden's nodes (with arthropathy): Secondary | ICD-10-CM

## 2020-07-16 NOTE — Progress Notes (Signed)
Assessment and Plan: Hospital discharge follow-up Retrieved from hospital discharge 06/18/2020 Follow-up with Kerin Perna, NP in 2 to 3 weeks.  On follow-up patient need a comprehensive metabolic profile done to follow-up on electrolytes and renal function and LFTs.  Patient also need a magnesium level checked.  Patient also need a CBC done to follow-up.  -     CMP14+EGFR -     Hepatic Function Panel -     Magnesium All labs obtained   Closed nondisplaced intertrochanteric fracture of right femur with routine healing, subsequent encounter Chronic pain right hip states he has had several surgeries which continues to cause increase pain with walking and unstable gait at times. He would like something for pain deferred to ortho to manage.   Heberden's nodes Question what the knots where on his hands and was causing difficulty trying to open containers, gripping and increase pain with movement. Provided information on AVS after discussion.   HPI Mr.Nolon D. Mogle is a 44 y.o.male presents for follow up from the hospital. Admit date to the hospital was 06/18/20, patient was discharged from the hospital on 06/21/20, patient was admitted for: Acute kidney injury secondary to dehydration..  Past Medical History:  Diagnosis Date  . Anxiety   . 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 Depression   . Diabetes mellitus    diet controlled  . Essential hypertension   . Gastritis   . Gout   . Normocytic anemia due to blood loss 08/21/2019  . Pulmonary embolism (HCC)      No Known Allergies    Current Outpatient Medications on File Prior to Visit  Medication Sig Dispense Refill  . pantoprazole (PROTONIX) 40 MG tablet Take 1 tablet (40 mg total) by mouth 2 (two) times daily. 60 tablet 1  . polyvinyl alcohol (LIQUIFILM TEARS) 1.4 % ophthalmic solution Place 1 drop into both eyes as needed for dry eyes.    [name] sucralfate (CARAFATE) 1 GM/10ML suspension Take 10 mLs (1 g total) by mouth 4  (four) times daily -  with meals and at bedtime. 420 mL 0  . thiamine 100 MG tablet Take 1 tablet (100 mg total) by mouth daily.    . diclofenac Sodium (VOLTAREN) 1 % GEL Apply 2 g topically in the morning, at noon, and at bedtime. Apply to both knees. (Patient taking differently: Apply 2 g topically 3 (three) times daily as needed (pain). Apply to both knees.) 350 g 0  . diltiazem (CARDIZEM CD) 120 MG 24 hr capsule Take 1 capsule (120 mg total) by mouth daily. 30 capsule 1  . folic acid (FOLVITE) 1 MG tablet Take 1 tablet (1 mg total) by mouth daily.    . hydrOXYzine (ATARAX/VISTARIL) 25 MG tablet Take 1 tablet (25 mg total) by mouth 3 (three) times daily as needed for itching or anxiety. (Patient not taking: Reported on 07/16/2020) 15 tablet 0  . metoprolol tartrate 75 MG TABS Take 75 mg by mouth 2 (two) times daily. (Patient not taking: Reported on 07/16/2020) 60 tablet 1   No current facility-administered medications on file prior to visit.    ROS: all negative except above.   Physical Exam: Filed Weights   07/16/20 1504  Weight: 224 lb (101.6 kg)   BP (!) 153/89 (BP Location: Right Arm, Patient Position: Sitting, Cuff Size: Normal)   Pulse 91   Temp 98.2 F (36.8 C) (Oral)   Ht _0  (1.981 m)   Wt 224 lb (  101.6 kg)   SpO2 98%   BMI 25.89 kg/m  General Appearance: Well nourished, in no apparent distress. Eyes: PERRLA, EOMs, conjunctiva no swelling or erythema Sinuses: No Frontal/maxillary tenderness ENT/Mouth: Ext aud canals clear, TMs without erythema, bulging. No erythema, swelling, or exudate on post pharynx.  Tonsils not swollen or erythematous. Hearing normal.  Neck: Supple, thyroid normal.  Respiratory: Respiratory effort normal, BS equal bilaterally without rales, rhonchi, wheezing or stridor.  Cardio: RRR with no MRGs. Brisk peripheral pulses without edema.  Abdomen: Soft, + BS.  Non tender, no guarding, rebound, hernias, masses. Lymphatics: Non tender without  lymphadenopathy.  Musculoskeletal:  Full ROM, 5/5 strength,Heberden's nodes bilateral hands  abnormal gait.  Skin: Warm, dry without rashes, lesions, ecchymosis.  Neuro: Cranial nerves intact. Normal muscle tone, no cerebellar symptoms. Sensation intact.  Psych: Awake and oriented X 3, normal affect, Insight and Judgment appropriate.     Kerin Perna, NP 3:20 PM

## 2020-07-16 NOTE — Patient Instructions (Addendum)
Heberden's nodes are hard bony lumps in the joints of your fingers. They are typically a symptom of osteoarthritis. The lumps grow on the joint closest to the tip of your finger, called the distal interphalangeal, or DIP joint.

## 2020-07-17 LAB — MAGNESIUM: Magnesium: 1.4 mg/dL — ABNORMAL LOW (ref 1.6–2.3)

## 2020-07-17 LAB — CMP14+EGFR
ALT: 13 IU/L (ref 0–44)
AST: 32 IU/L (ref 0–40)
Albumin/Globulin Ratio: 1.3 (ref 1.2–2.2)
Albumin: 4.4 g/dL (ref 4.0–5.0)
Alkaline Phosphatase: 121 IU/L (ref 44–121)
BUN/Creatinine Ratio: 7 — ABNORMAL LOW (ref 9–20)
BUN: 5 mg/dL — ABNORMAL LOW (ref 6–24)
Bilirubin Total: <0.2 mg/dL (ref 0.0–1.2)
CO2: 20 mmol/L (ref 20–29)
Calcium: 9.1 mg/dL (ref 8.7–10.2)
Chloride: 101 mmol/L (ref 96–106)
Creatinine, Ser: 0.72 mg/dL — ABNORMAL LOW (ref 0.76–1.27)
GFR calc Af Amer: 132 mL/min/{1.73_m2} (ref >59)
GFR calc non Af Amer: 114 mL/min/{1.73_m2} (ref >59)
Globulin, Total: 3.3 g/dL (ref 1.5–4.5)
Glucose: 76 mg/dL (ref 65–99)
Potassium: 4.5 mmol/L (ref 3.5–5.2)
Sodium: 140 mmol/L (ref 134–144)
Total Protein: 7.7 g/dL (ref 6.0–8.5)

## 2020-07-17 LAB — HEPATIC FUNCTION PANEL: Bilirubin, Direct: <0.1 mg/dL (ref 0.00–0.40)

## 2020-08-09 ENCOUNTER — Ambulatory Visit (INDEPENDENT_AMBULATORY_CARE_PROVIDER_SITE_OTHER): Payer: Self-pay | Admitting: Primary Care

## 2020-10-15 IMAGING — CT CT HEAD W/O CM
3 series · 16 of 47 positions shown, 19 images · non-contrast
Comparison: 04/11/2020

CLINICAL DATA: Altered mental status

EXAM:
CT HEAD WITHOUT CONTRAST
TECHNIQUE: Contiguous axial images were obtained from the base of the skull
through the vertex without intravenous contrast.

[Series 2: head wo · axial · 0.47mm/px · z∈[+1424,+1554]mm · 10 of 32 slices shown, 13 images]
[im 3/32  brain]
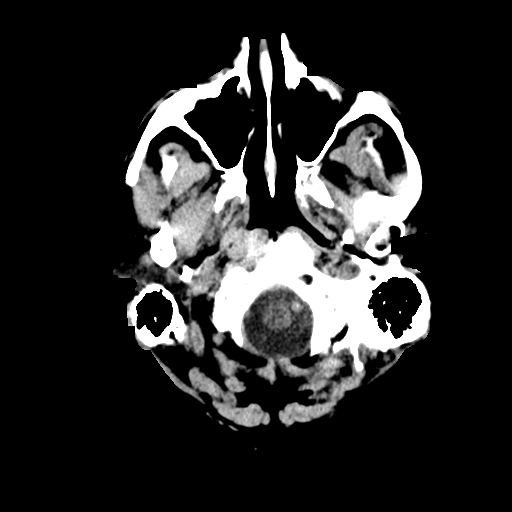
[im 3/32  bone]
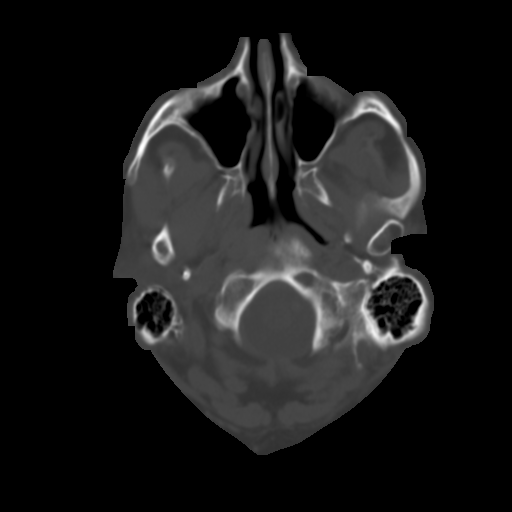
[im 6/32  brain]
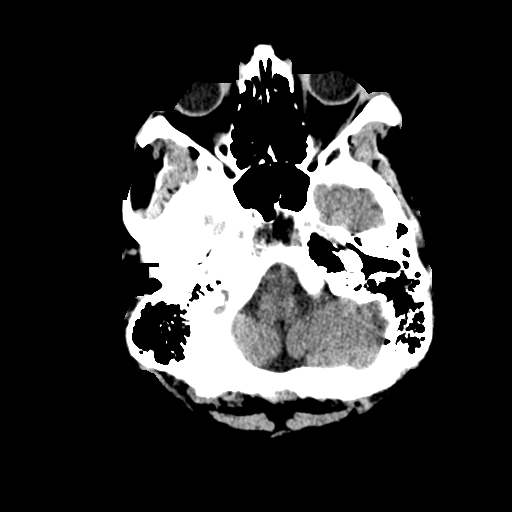
[im 9/32  brain]
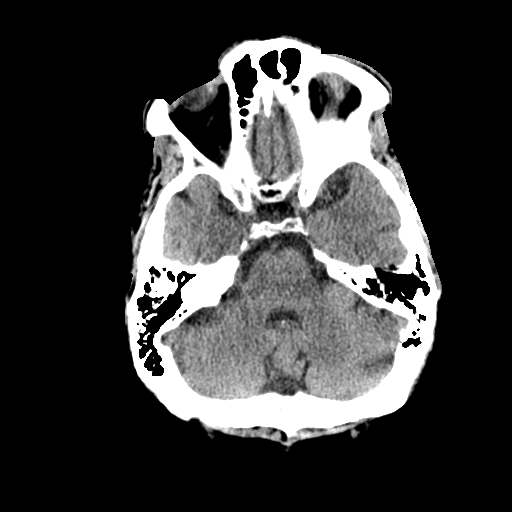
[im 11/32  brain]
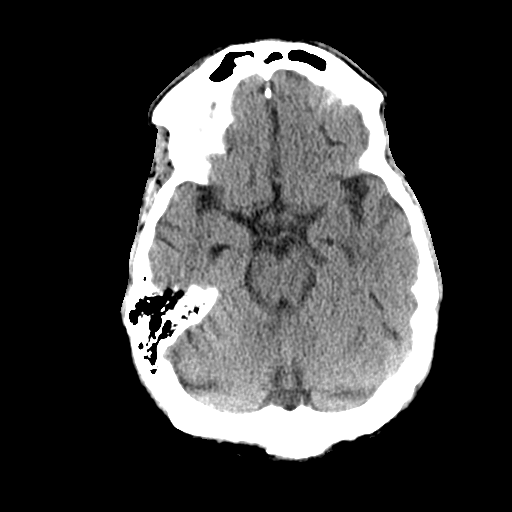
[im 14/32  brain]
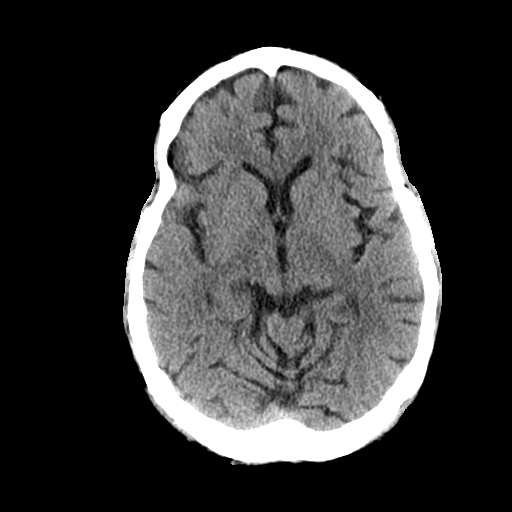
[im 14/32  bone]
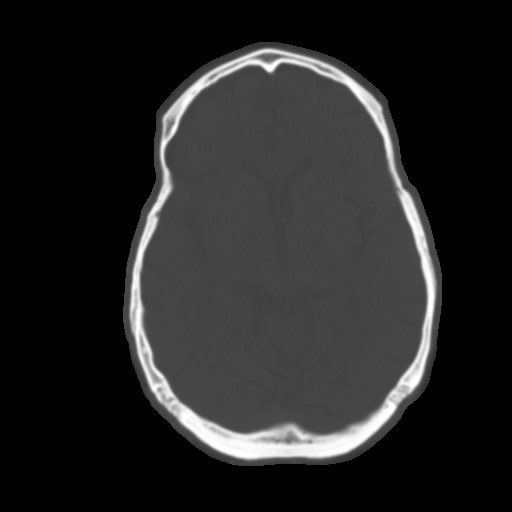
[im 18/32  brain]
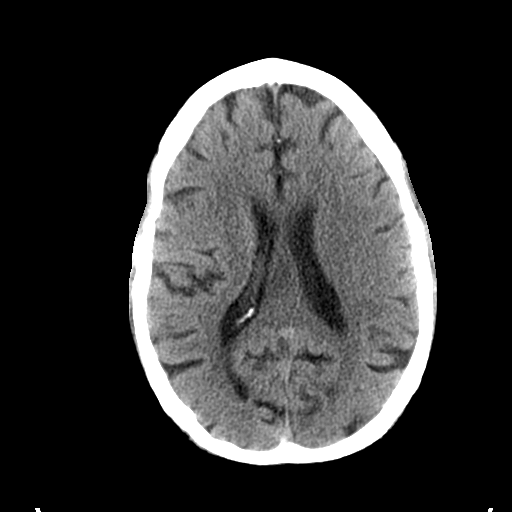
[im 21/32  brain]
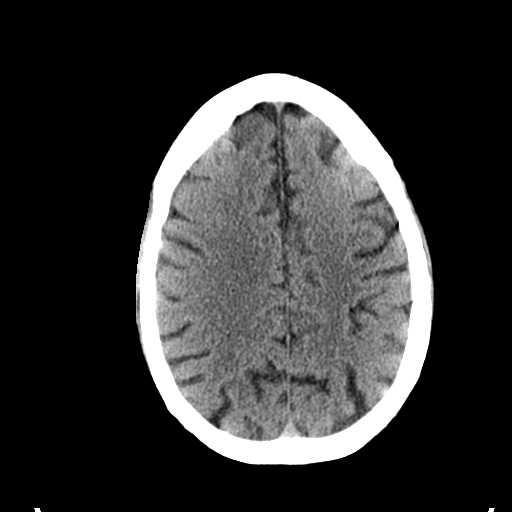
[im 24/32  brain]
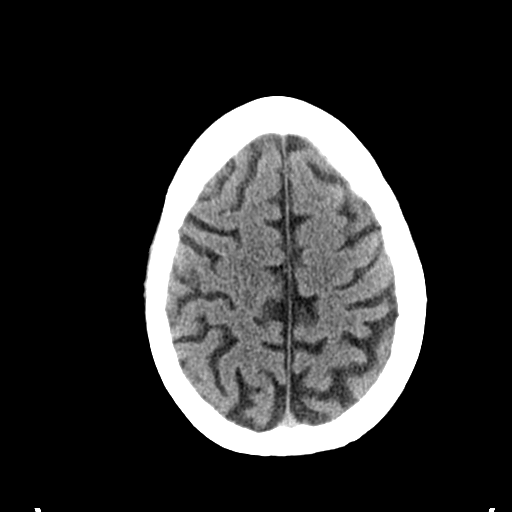
[im 26/32  brain]
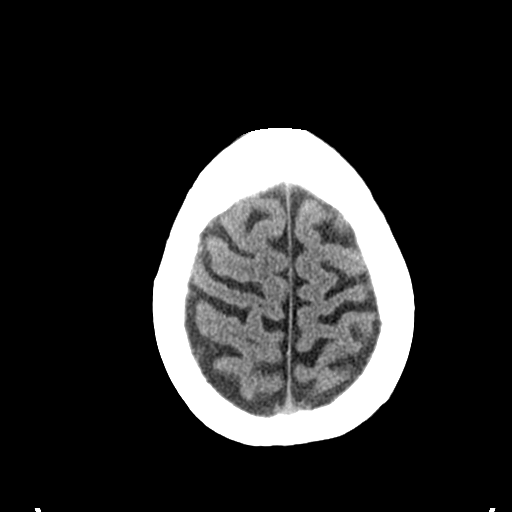
[im 26/32  bone]
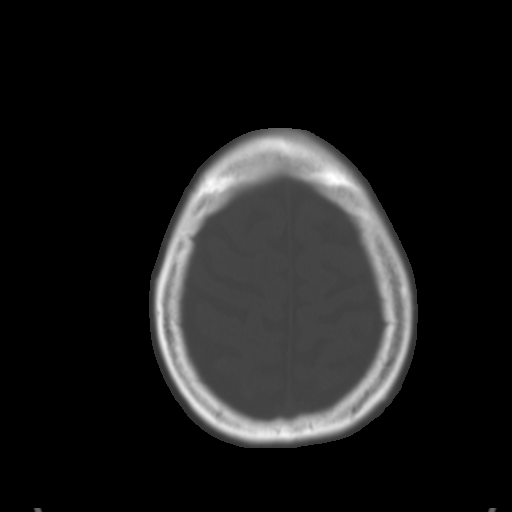
[im 29/32  brain]
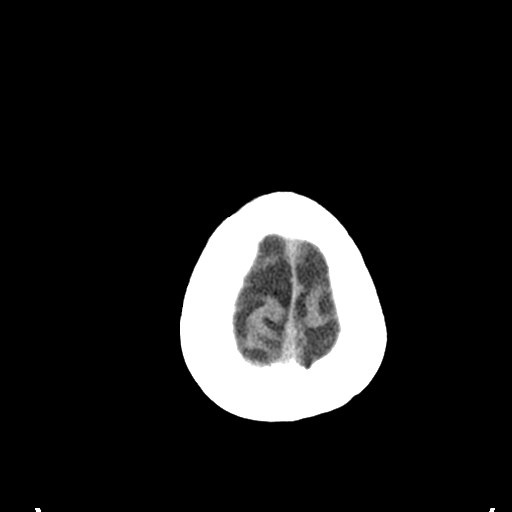

[Series 4: coronal soft tissue · coronal · 0.31mm/px · 3 of 68 slices shown]
[im 23/68  brain]
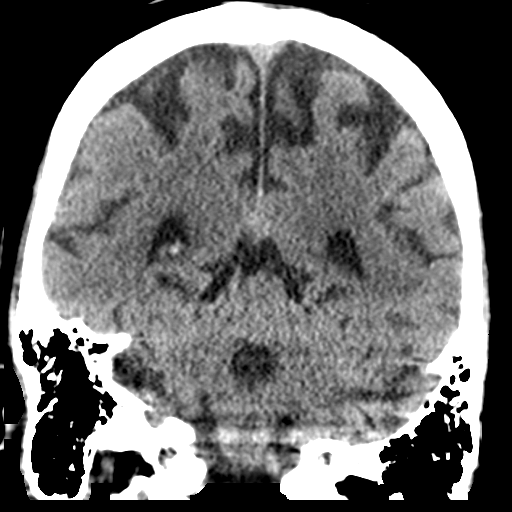
[im 30/68  brain]
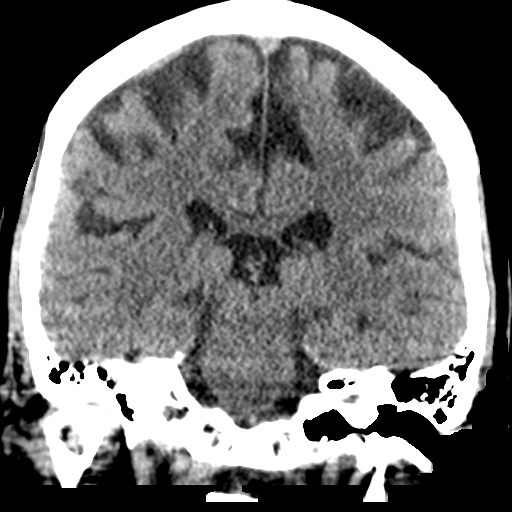
[im 38/68  brain]
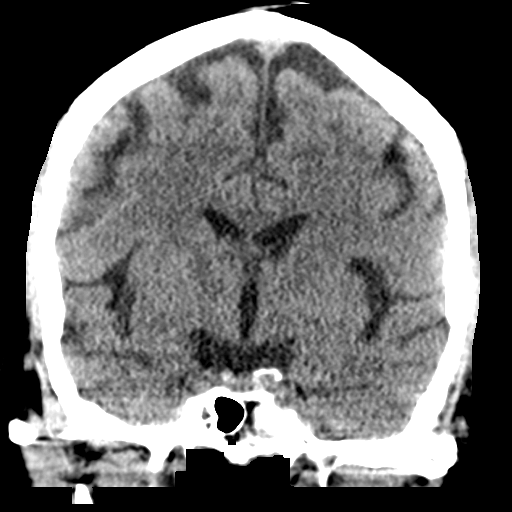

[Series 5: sagittal soft tissue · sagittal · 0.31mm/px · 3 of 51 slices shown]
[im 17/51  brain]
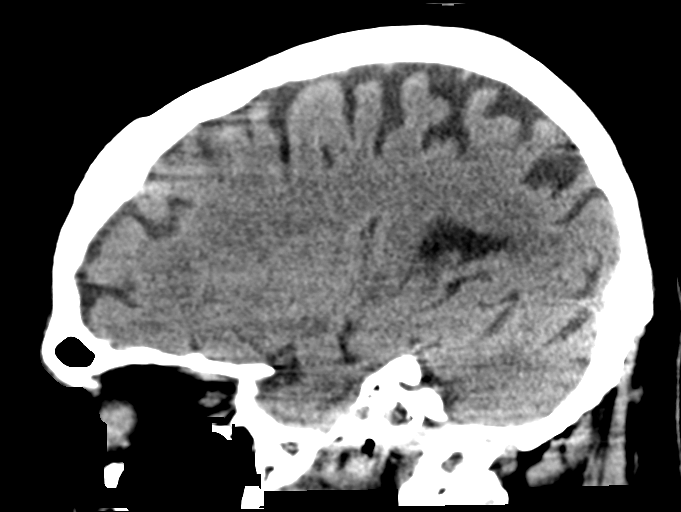
[im 26/51  brain]
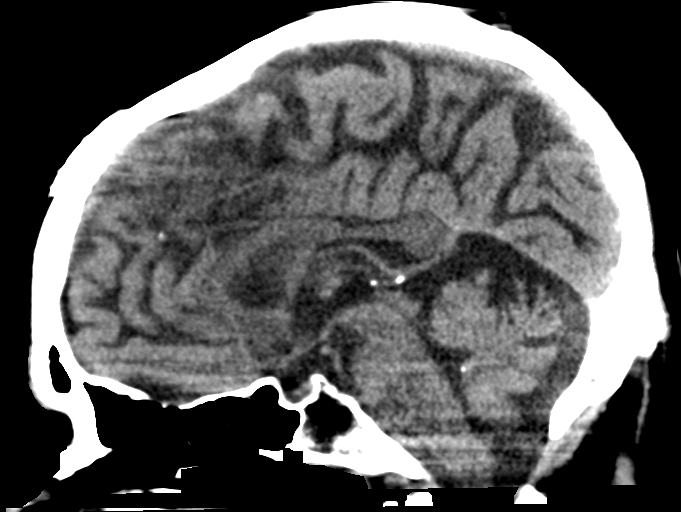
[im 34/51  brain]
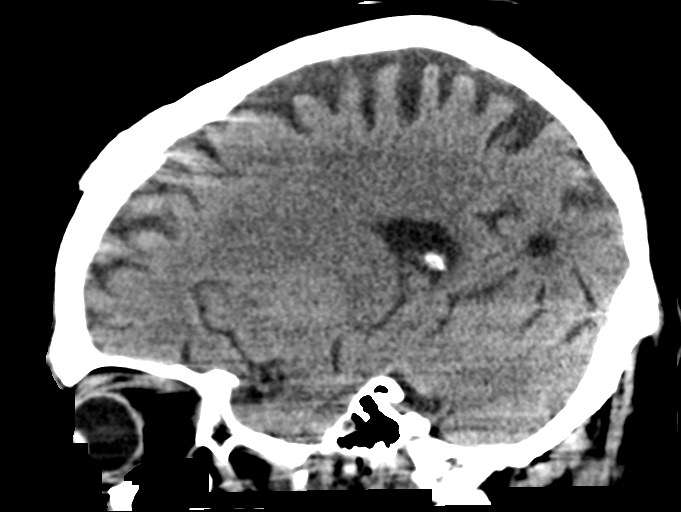

[16 of 47 positions shown; findings below may reference images not displayed]

FINDINGS: Brain: No acute intracranial abnormality. Specifically, no
hemorrhage, hydrocephalus, mass lesion, acute infarction, or
significant intracranial injury. Mild age advanced volume loss.

Vascular: No hyperdense vessel or unexpected calcification.

Skull: No acute calvarial abnormality.

Sinuses/Orbits: Visualized paranasal sinuses and mastoids clear.
Orbital soft tissues unremarkable.

Other: None
IMPRESSION: No acute intracranial abnormality.

## 2020-10-17 DIAGNOSIS — Z0289 Encounter for other administrative examinations: Secondary | ICD-10-CM

## 2020-10-18 ENCOUNTER — Ambulatory Visit (INDEPENDENT_AMBULATORY_CARE_PROVIDER_SITE_OTHER): Payer: Self-pay | Admitting: Primary Care

## 2020-10-22 ENCOUNTER — Encounter (HOSPITAL_COMMUNITY): Payer: Self-pay

## 2020-10-22 ENCOUNTER — Other Ambulatory Visit: Payer: Self-pay

## 2020-10-22 ENCOUNTER — Inpatient Hospital Stay (HOSPITAL_COMMUNITY)
Admission: EM | Admit: 2020-10-22 | Discharge: 2020-10-24 | DRG: 683 | Disposition: A | Discharge status: Home / Self Care | Payer: Self-pay | Attending: Internal Medicine | Admitting: Internal Medicine

## 2020-10-22 ENCOUNTER — Emergency Department (HOSPITAL_COMMUNITY): Payer: Self-pay

## 2020-10-22 DIAGNOSIS — F1090 Alcohol use, unspecified, uncomplicated: Secondary | ICD-10-CM

## 2020-10-22 DIAGNOSIS — Z20822 Contact with and (suspected) exposure to covid-19: Secondary | ICD-10-CM | POA: Diagnosis present

## 2020-10-22 DIAGNOSIS — E860 Dehydration: Secondary | ICD-10-CM | POA: Diagnosis present

## 2020-10-22 DIAGNOSIS — F17210 Nicotine dependence, cigarettes, uncomplicated: Secondary | ICD-10-CM | POA: Diagnosis present

## 2020-10-22 DIAGNOSIS — E1169 Type 2 diabetes mellitus with other specified complication: Secondary | ICD-10-CM | POA: Diagnosis present

## 2020-10-22 DIAGNOSIS — E1165 Type 2 diabetes mellitus with hyperglycemia: Secondary | ICD-10-CM | POA: Diagnosis present

## 2020-10-22 DIAGNOSIS — Z9114 Patient's other noncompliance with medication regimen: Secondary | ICD-10-CM

## 2020-10-22 DIAGNOSIS — E861 Hypovolemia: Secondary | ICD-10-CM | POA: Diagnosis present

## 2020-10-22 DIAGNOSIS — Z86718 Personal history of other venous thrombosis and embolism: Secondary | ICD-10-CM

## 2020-10-22 DIAGNOSIS — Z7289 Other problems related to lifestyle: Secondary | ICD-10-CM

## 2020-10-22 DIAGNOSIS — F1010 Alcohol abuse, uncomplicated: Secondary | ICD-10-CM | POA: Diagnosis present

## 2020-10-22 DIAGNOSIS — E785 Hyperlipidemia, unspecified: Secondary | ICD-10-CM | POA: Diagnosis present

## 2020-10-22 DIAGNOSIS — R339 Retention of urine, unspecified: Secondary | ICD-10-CM | POA: Diagnosis present

## 2020-10-22 DIAGNOSIS — I480 Paroxysmal atrial fibrillation: Secondary | ICD-10-CM | POA: Diagnosis present

## 2020-10-22 DIAGNOSIS — Z789 Other specified health status: Secondary | ICD-10-CM

## 2020-10-22 DIAGNOSIS — I160 Hypertensive urgency: Secondary | ICD-10-CM | POA: Diagnosis present

## 2020-10-22 DIAGNOSIS — Z86711 Personal history of pulmonary embolism: Secondary | ICD-10-CM

## 2020-10-22 DIAGNOSIS — N179 Acute kidney failure, unspecified: Principal | ICD-10-CM | POA: Diagnosis present

## 2020-10-22 DIAGNOSIS — E871 Hypo-osmolality and hyponatremia: Secondary | ICD-10-CM | POA: Diagnosis present

## 2020-10-22 DIAGNOSIS — I10 Essential (primary) hypertension: Secondary | ICD-10-CM | POA: Diagnosis present

## 2020-10-22 DIAGNOSIS — Z79899 Other long term (current) drug therapy: Secondary | ICD-10-CM

## 2020-10-22 DIAGNOSIS — E876 Hypokalemia: Secondary | ICD-10-CM | POA: Diagnosis present

## 2020-10-22 LAB — URINALYSIS, ROUTINE W REFLEX MICROSCOPIC
Glucose, UA: NEGATIVE mg/dL
Ketones, ur: NEGATIVE mg/dL
Leukocytes,Ua: NEGATIVE
Nitrite: NEGATIVE
Protein, ur: 100 mg/dL — AB
Specific Gravity, Urine: 1.024 (ref 1.005–1.030)
pH: 5 (ref 5.0–8.0)

## 2020-10-22 LAB — COMPREHENSIVE METABOLIC PANEL
ALT: 33 U/L (ref 0–44)
AST: 66 U/L — ABNORMAL HIGH (ref 15–41)
Albumin: 4.4 g/dL (ref 3.5–5.0)
Alkaline Phosphatase: 92 U/L (ref 38–126)
Anion gap: 21 — ABNORMAL HIGH (ref 5–15)
BUN: 15 mg/dL (ref 6–20)
CO2: 24 mmol/L (ref 22–32)
Calcium: 9.4 mg/dL (ref 8.9–10.3)
Chloride: 81 mmol/L — ABNORMAL LOW (ref 98–111)
Creatinine, Ser: 2.59 mg/dL — ABNORMAL HIGH (ref 0.61–1.24)
GFR, Estimated: 31 mL/min — ABNORMAL LOW (ref >60)
Glucose, Bld: 222 mg/dL — ABNORMAL HIGH (ref 70–99)
Potassium: 3 mmol/L — ABNORMAL LOW (ref 3.5–5.1)
Sodium: 126 mmol/L — ABNORMAL LOW (ref 135–145)
Total Bilirubin: 1.3 mg/dL — ABNORMAL HIGH (ref 0.3–1.2)
Total Protein: 9.3 g/dL — ABNORMAL HIGH (ref 6.5–8.1)

## 2020-10-22 LAB — CBC WITH DIFFERENTIAL/PLATELET
Abs Immature Granulocytes: 0.05 10*3/uL (ref 0.00–0.07)
Basophils Absolute: 0 10*3/uL (ref 0.0–0.1)
Basophils Relative: 0 %
Eosinophils Absolute: 0.2 10*3/uL (ref 0.0–0.5)
Eosinophils Relative: 2 %
HCT: 40.8 % (ref 39.0–52.0)
Hemoglobin: 14.5 g/dL (ref 13.0–17.0)
Immature Granulocytes: 1 %
Lymphocytes Relative: 20 %
Lymphs Abs: 1.4 10*3/uL (ref 0.7–4.0)
MCH: 30.5 pg (ref 26.0–34.0)
MCHC: 35.5 g/dL (ref 30.0–36.0)
MCV: 85.9 fL (ref 80.0–100.0)
Monocytes Absolute: 1.1 10*3/uL — ABNORMAL HIGH (ref 0.1–1.0)
Monocytes Relative: 15 %
Neutro Abs: 4.6 10*3/uL (ref 1.7–7.7)
Neutrophils Relative %: 62 %
Platelets: 205 10*3/uL (ref 150–400)
RBC: 4.75 MIL/uL (ref 4.22–5.81)
RDW: 13.8 % (ref 11.5–15.5)
WBC: 7.3 10*3/uL (ref 4.0–10.5)
nRBC: 0 % (ref 0.0–0.2)

## 2020-10-22 LAB — MAGNESIUM: Magnesium: 1.6 mg/dL — ABNORMAL LOW (ref 1.7–2.4)

## 2020-10-22 LAB — LIPASE, BLOOD: Lipase: 44 U/L (ref 11–51)

## 2020-10-22 LAB — RESP PANEL BY RT-PCR (FLU A&B, COVID) ARPGX2
Influenza A by PCR: NEGATIVE
Influenza B by PCR: NEGATIVE
SARS Coronavirus 2 by RT PCR: NEGATIVE

## 2020-10-22 LAB — HEMOGLOBIN A1C
Hgb A1c MFr Bld: 5.4 % (ref 4.8–5.6)
Mean Plasma Glucose: 108.28 mg/dL

## 2020-10-22 LAB — GLUCOSE, CAPILLARY
Glucose-Capillary: 163 mg/dL — ABNORMAL HIGH (ref 70–99)
Glucose-Capillary: 97 mg/dL (ref 70–99)

## 2020-10-22 MED ORDER — ONDANSETRON HCL 4 MG/2ML IJ SOLN
4.0000 mg | Freq: Once | INTRAMUSCULAR | Status: AC
  Administered 2020-10-22: 11:00:00 4 mg via INTRAVENOUS
  Filled 2020-10-22: qty 2

## 2020-10-22 MED ORDER — DILTIAZEM HCL ER COATED BEADS 120 MG PO CP24: 120.0000 mg | ORAL_CAPSULE | Freq: Every day | ORAL | Status: DC

## 2020-10-22 MED ORDER — HYDROMORPHONE HCL 1 MG/ML IJ SOLN
1.0000 mg | Freq: Once | INTRAMUSCULAR | Status: AC
  Administered 2020-10-22: 11:00:00 1 mg via INTRAVENOUS
  Filled 2020-10-22: qty 1

## 2020-10-22 MED ORDER — HYDROXYZINE HCL 25 MG PO TABS: 25.0000 mg | ORAL_TABLET | Freq: Three times a day (TID) | ORAL | Status: DC | PRN

## 2020-10-22 MED ORDER — ACETAMINOPHEN 325 MG PO TABS
650.0000 mg | ORAL_TABLET | Freq: Four times a day (QID) | ORAL | Status: DC | PRN
  Administered 2020-10-22 – 2020-10-24 (×2): 650 mg via ORAL
  Filled 2020-10-22 (×2): qty 2

## 2020-10-22 MED ORDER — SODIUM CHLORIDE 0.9 % IV BOLUS (SEPSIS)
1000.0000 mL | Freq: Once | INTRAVENOUS | Status: AC
  Administered 2020-10-22: 13:00:00 1000 mL via INTRAVENOUS

## 2020-10-22 MED ORDER — POTASSIUM CHLORIDE 10 MEQ/100ML IV SOLN
10.0000 meq | INTRAVENOUS | Status: AC
  Administered 2020-10-22 (×2): 10 meq via INTRAVENOUS
  Filled 2020-10-22 (×2): qty 100

## 2020-10-22 MED ORDER — THIAMINE HCL 100 MG PO TABS
100.0000 mg | ORAL_TABLET | Freq: Every day | ORAL | Status: DC
  Administered 2020-10-23 – 2020-10-24 (×2): 100 mg via ORAL
  Filled 2020-10-22 (×2): qty 1

## 2020-10-22 MED ORDER — HEPARIN SODIUM (PORCINE) 5000 UNIT/ML IJ SOLN
5000.0000 [IU] | Freq: Three times a day (TID) | INTRAMUSCULAR | Status: DC
  Administered 2020-10-22 – 2020-10-24 (×5): 5000 [IU] via SUBCUTANEOUS
  Filled 2020-10-22 (×5): qty 1

## 2020-10-22 MED ORDER — FOLIC ACID 1 MG PO TABS
1.0000 mg | ORAL_TABLET | Freq: Every day | ORAL | Status: DC
  Administered 2020-10-23 – 2020-10-24 (×2): 1 mg via ORAL
  Filled 2020-10-22 (×2): qty 1

## 2020-10-22 MED ORDER — POLYETHYLENE GLYCOL 3350 17 G PO PACK: 17.0000 g | PACK | Freq: Every day | ORAL | Status: DC | PRN

## 2020-10-22 MED ORDER — ONDANSETRON HCL 4 MG/2ML IJ SOLN
4.0000 mg | Freq: Four times a day (QID) | INTRAMUSCULAR | Status: DC | PRN
  Administered 2020-10-23 (×2): 4 mg via INTRAVENOUS
  Filled 2020-10-22 (×2): qty 2

## 2020-10-22 MED ORDER — INSULIN ASPART 100 UNIT/ML ~~LOC~~ SOLN
0.0000 [IU] | Freq: Three times a day (TID) | SUBCUTANEOUS | Status: DC
  Administered 2020-10-22: 17:00:00 1 [IU] via SUBCUTANEOUS

## 2020-10-22 MED ORDER — PANTOPRAZOLE SODIUM 40 MG PO TBEC
40.0000 mg | DELAYED_RELEASE_TABLET | Freq: Two times a day (BID) | ORAL | Status: DC
  Administered 2020-10-22 – 2020-10-24 (×4): 40 mg via ORAL
  Filled 2020-10-22 (×4): qty 1

## 2020-10-22 MED ORDER — ACETAMINOPHEN 650 MG RE SUPP: 650.0000 mg | Freq: Four times a day (QID) | RECTAL | Status: DC | PRN

## 2020-10-22 MED ORDER — ENOXAPARIN SODIUM 40 MG/0.4ML ~~LOC~~ SOLN: 40.0000 mg | SUBCUTANEOUS | Status: DC

## 2020-10-22 MED ORDER — SODIUM CHLORIDE 0.9 % IV SOLN
1000.0000 mL | INTRAVENOUS | Status: DC
  Administered 2020-10-22 – 2020-10-24 (×5): 1000 mL via INTRAVENOUS

## 2020-10-22 MED ORDER — METOPROLOL TARTRATE 25 MG PO TABS
75.0000 mg | ORAL_TABLET | Freq: Two times a day (BID) | ORAL | Status: DC
  Administered 2020-10-22 – 2020-10-24 (×4): 75 mg via ORAL
  Filled 2020-10-22 (×4): qty 3

## 2020-10-22 MED ORDER — MAGNESIUM SULFATE 2 GM/50ML IV SOLN
2.0000 g | Freq: Once | INTRAVENOUS | Status: AC
  Administered 2020-10-22: 19:00:00 2 g via INTRAVENOUS
  Filled 2020-10-22: qty 50

## 2020-10-22 NOTE — H&P (Signed)
History and Physical        Hospital Admission Note Date: 10/22/2020  Patient name: Richard Lopez Medical record number: [id number] Date of birth: [birth year] Age: 44 y.o. Gender: male  PCP: [name], NP  Chief Complaint    Chief Complaint  Patient presents with  . Urinary Retention      HPI:   This is a 44 year old male with past medical history of paroxysmal atrial fibrillation not on anticoagulation, alcohol abuse, delirium tremens, gastritis, hypertension, tophaceous gout, diet-controlled diabetes, depression, right hip fracture with subsequent provoked DVT/PE in 2020 who presented to the ED with complaints of nausea, vomiting and left back pain for several days.  He states that he used to drink very heavily but had cut back significantly several months ago.  2 weeks ago he started drinking heavily again, about a cup of vodka and a few beers daily.  He only drank this amount for about 1 week then stop drinking alcohol.  He denies any withdrawal symptoms since stopping.  He was doing well until this past Friday when he had nausea and vomiting which persisted throughout the weekend.  He has been unable to tolerate p.o. intake since then.  Also has mid/left-sided back pain without a history of nephrolithiasis.  Associated with decreased urine output and also with bilateral lower extremity cramping.  No known fevers, chest pain, shortness of breath.  He was hypertensive in the ED (213/119).  He states that he never got a call regarding his refills for his antihypertensives and has been without any medications for an unknown period of time but believes it has been several months.  States he was supposed to have a follow-up with his PCP sometime this week.  ED Course: Afebrile, hypertensive, on room air. Notable Labs: Sodium 126, K3.0, chloride 81, glucose 222, BUN 15, creatinine  2.59, AG 21, lipase 44, AST 66, ALT 33, T bili 1.3, WBC 7.3, Hb 14.5, platelets 205, COVID-19 and flu negative. Notable Imaging: Chest and abdomen x-ray-possible small right pleural effusion, no acute abdominal findings, renal US pending. Patient received Dilaudid, Zofran, 2 L NS bolus and maintenance fluid.    Vitals:   10/22/20 1416 10/22/20 1535  BP: (!) 164/110 (!) 150/111  Pulse: 83 85  Resp: 15 16  Temp: 98 F (36.7 C) 97.6 F (36.4 C)  SpO2: 95% 100%     Review of Systems:  Review of Systems  All other systems reviewed and are negative.   Medical/Social/Family History   Past Medical History: Past Medical History:  Diagnosis Date  . Anxiety   . 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 Depression   . Diabetes mellitus    diet controlled  . Essential hypertension   . Gastritis   . Gout   . Normocytic anemia due to blood loss 08/21/2019  . Pulmonary embolism [facility])     Past Surgical History:  Procedure Laterality Date  . ESOPHAGOGASTRODUODENOSCOPY (EGD) WITH PROPOFOL N/A 08/14/2019   Procedure: ESOPHAGOGASTRODUODENOSCOPY (EGD) WITH PROPOFOL;  Surgeon: Charlott Rakes, MD;  Location: [facility] ENDOSCOPY;  Service: Endoscopy;  Laterality: N/A;  . EXTERNAL FIXATION LEG Left 11/07/2018   Procedure: EXTERNAL FIXATION LEFT LOWER LEG;  Surgeon: Samson Frederic, MD;  Location: [facility] ORS;  Service: Orthopedics;  Laterality: Left;  . EXTERNAL FIXATION REMOVAL Left 11/08/2018   Procedure: REMOVAL EXTERNAL FIXATION LEG;  Surgeon: Roby Lofts, MD;  Location: [facility] OR;  Service: Orthopedics;  Laterality: Left;  . INTRAMEDULLARY (IM) NAIL INTERTROCHANTERIC Right 03/26/2019   Procedure: INTRAMEDULLARY (IM) NAIL INTERTROCHANTRIC;  Surgeon: Roby Lofts, MD;  Location: [facility] OR;  Service: Orthopedics;  Laterality: Right;  . INTRAMEDULLARY (IM) NAIL INTERTROCHANTERIC Right 05/17/2019   Procedure: Intramedullary (Im) Nail Intertroch with circlage wiring;  Surgeon: Myrene Galas, MD;  Location: [facility]  OR;  Service: Orthopedics;  Laterality: Right;  . NO PAST SURGERIES    . OPEN REDUCTION INTERNAL FIXATION (ORIF) TIBIA/FIBULA FRACTURE Left 11/08/2018   Procedure: OPEN REDUCTION INTERNAL FIXATION (ORIF) TIBIA/FIBULA FRACTURE;  Surgeon: Roby Lofts, MD;  Location: [facility] OR;  Service: Orthopedics;  Laterality: Left;  . ORIF FEMUR FRACTURE Right 05/17/2019   Procedure: REMOVAL  OF HARDWARE;  Surgeon: Myrene Galas, MD;  Location: [facility] OR;  Service: Orthopedics;  Laterality: Right;    Medications: Prior to Admission medications   Medication Sig Start Date End Date Taking? Authorizing Provider  acetaminophen (TYLENOL) 325 MG tablet Take 650 mg by mouth every 6 (six) hours as needed for mild pain, fever or headache.   Yes [provider]  diclofenac Sodium (VOLTAREN) 1 % GEL Apply 2 g topically in the morning, at noon, and at bedtime. Apply to both knees. Patient taking differently: Apply 2 g topically 3 (three) times daily as needed (pain). Apply to both knees. 04/23/20  Yes Arrien, York Ram, MD  omeprazole (PRILOSEC) 20 MG capsule Take 20 mg by mouth daily.   Yes [provider]  diltiazem (CARDIZEM CD) 120 MG 24 hr capsule Take 1 capsule (120 mg total) by mouth daily. Patient not taking: Reported on 10/22/2020 06/21/20   Rodolph Bong, MD  folic acid (FOLVITE) 1 MG tablet Take 1 tablet (1 mg total) by mouth daily. Patient not taking: Reported on 10/22/2020 06/22/20   Rodolph Bong, MD  hydrOXYzine (ATARAX/VISTARIL) 25 MG tablet Take 1 tablet (25 mg total) by mouth 3 (three) times daily as needed for itching or anxiety. Patient not taking: No sig reported 06/21/20   Rodolph Bong, MD  metoprolol tartrate 75 MG TABS Take 75 mg by mouth 2 (two) times daily. Patient not taking: No sig reported 06/21/20 06/21/21  Rodolph Bong, MD  pantoprazole (PROTONIX) 40 MG tablet Take 1 tablet (40 mg total) by mouth 2 (two) times daily. Patient not taking: Reported on  10/22/2020 06/21/20   Rodolph Bong, MD  sucralfate (CARAFATE) 1 GM/10ML suspension Take 10 mLs (1 g total) by mouth 4 (four) times daily -  with meals and at bedtime. Patient not taking: Reported on 10/22/2020 06/21/20   Rodolph Bong, MD  thiamine 100 MG tablet Take 1 tablet (100 mg total) by mouth daily. Patient not taking: Reported on 10/22/2020 06/22/20   Rodolph Bong, MD    Allergies:  No Known Allergies  Social History:  reports that he has quit smoking. His smoking use included cigarettes. He smoked 0.00 packs per day. He has never used smokeless tobacco. He reports current alcohol use of about 200.0 standard drinks of alcohol per week. He reports current drug use. Drug: Marijuana.  Family History: Family History  Problem Relation Age of Onset  . Diabetes Mellitus II Father   . Diabetes Mellitus II Other   .  CAD Other      Objective   Physical Exam: Blood pressure (!) 150/111, pulse 85, temperature 97.6 F (36.4 C), temperature source Oral, resp. rate 16, height  (1.981 m), weight 97.5 kg, SpO2 100 %.  Physical Exam Vitals and nursing note reviewed.  Constitutional:      Appearance: Normal appearance.  HENT:     Head: Normocephalic and atraumatic.  Eyes:     Conjunctiva/sclera: Conjunctivae normal.  Cardiovascular:     Rate and Rhythm: Normal rate and regular rhythm.  Pulmonary:     Effort: Pulmonary effort is normal.     Breath sounds: Normal breath sounds.  Abdominal:     General: Abdomen is flat.     Palpations: Abdomen is soft.     Tenderness: There is no right CVA tenderness or left CVA tenderness.  Musculoskeletal:        General: No swelling or tenderness.     Comments: Diffuse tophi of joints  Skin:    Coloration: Skin is not jaundiced or pale.  Neurological:     Mental Status: He is alert. Mental status is at baseline.  Psychiatric:        Mood and Affect: Mood normal.        Behavior: Behavior normal.     LABS on Admission:  I have personally reviewed all the labs and imaging below    Basic Metabolic Panel: Recent Labs  Lab 10/22/20 1105  NA 126*  K 3.0*  CL 81*  CO2 24  GLUCOSE 222*  BUN 15  CREATININE 2.59*  CALCIUM 9.4   Liver Function Tests: Recent Labs  Lab 10/22/20 1105  AST 66*  ALT 33  ALKPHOS 92  BILITOT 1.3*  PROT 9.3*  ALBUMIN 4.4   Recent Labs  Lab 10/22/20 1105  LIPASE 44   No results for input(s): AMMONIA in the last 168 hours. CBC: Recent Labs  Lab 10/22/20 1105  WBC 7.3  NEUTROABS 4.6  HGB 14.5  HCT 40.8  MCV 85.9  PLT 205   Cardiac Enzymes: No results for input(s): CKTOTAL, CKMB, CKMBINDEX, TROPONINI in the last 168 hours. BNP: Invalid input(s): POCBNP CBG: No results for input(s): GLUCAP in the last 168 hours.  Radiological Exams on Admission:  US Renal  Result Date: 10/22/2020 CLINICAL DATA:  Decreased urine output EXAM: RENAL / URINARY TRACT ULTRASOUND COMPLETE COMPARISON:  Abdominal ultrasound June 21, 2020 FINDINGS: Right Kidney: Renal measurements: 11.1 x 4.5 x 6.5 cm = volume: 171 mL. Echogenicity and renal cortical thickness are within normal limits. No mass, perinephric fluid, or hydronephrosis visualized. No sonographically demonstrable calculus or ureterectasis. Left Kidney: Renal measurements: 11.5 x 5.98 cm = volume: 205 mL. Echogenicity and renal cortical thickness are within normal limits. No mass, perinephric fluid, or hydronephrosis visualized. No sonographically demonstrable calculus or ureterectasis. Bladder: Appears normal for degree of bladder distention. Other: None. IMPRESSION: Study within normal limits. Electronically Signed   By: Bretta Bang III M.D.   On: 10/22/2020 14:42   DG Abd Acute W/Chest  Result Date: 10/22/2020 CLINICAL DATA:  Abdominal and back pain with cough. Left flank pain with difficulty urinating for 3 days. EXAM: DG ABDOMEN ACUTE WITH 1 VIEW CHEST COMPARISON:  Radiographs 06/29/2009 and 06/18/2020. Chest CTA  04/17/2020 FINDINGS: The heart size and mediastinal contours are stable. The lungs are clear. There is blunting of the right costophrenic angle which may reflect a small right pleural effusion. No evidence of left pleural effusion or pneumothorax. The bowel  gas pattern is normal. There is no evidence of free intraperitoneal air. There are no suspicious abdominal calcifications. Tiny left pelvic calcification is unchanged, likely a phlebolith. Mild degenerative changes are present throughout the spine. Patient is status post proximal right femoral ORIF. IMPRESSION: 1. Possible small right pleural effusion. 2. No acute abdominal findings. Electronically Signed   By: Carey Bullocks M.D.   On: 10/22/2020 12:09      EKG: normal EKG, normal sinus rhythm, PAC's noted   A & P   Principal Problem:   AKI (acute kidney injury) (HCC) Active Problems:   Type 2 diabetes mellitus with hyperlipidemia (HCC)   Hypertensive urgency   Alcohol use   Hypokalemia   Hyponatremia   Dehydration   AF (paroxysmal atrial fibrillation) (HCC)   1. AKI likely from GI losses and hemodynamic changes associated with oliguria a. Creatinine 2.59 on presentation, baseline 0.72 b. Continue with IV hydration and follow-up in a.m.  2. Nausea and vomiting likely viral versus alcohol induced  a. Continue IV fluids b. Zofran for nausea c. Check magnesium d. Advance diet to clear liquids at patient's request  3. Hypertensive urgency a. Likely from pain and noncompliance with medication b. Restart home Lopressor  4. Hyponatremia from poor p.o. intake and dehydration a. IV fluids  5. Hypokalemia a. Replete  6. Paroxysmal atrial fibrillation a. Currently in sinus and not on anticoagulation b. CHA2DS2-VASc: 4 (hypertension, thromboembolism history, diabetes) c. Continue home Lopressor, hold long-acting Cardizem for now d. Consider starting anticoagulation prior to discharge  7. Left back/flank pain, likely  musculoskeletal a. Abdominal x-ray and renal ultrasound unremarkable b. Heating pad and Tylenol  8. Bilateral leg discomfort likely cramping from electrolyte abnormalities a. Replete K b. Check magnesium  9. Alcohol abuse a. Last alcoholic drink was about a week ago and without any signs of withdrawal currently b. Encouraged cessation  10. Diabetes with hyperglycemia a. Check HbA1c b. Very sensitive sliding scale in setting of AKI    DVT prophylaxis: Heparin   Code Status: Full Code  Diet: Clear liquid diet Family Communication: Admission, patients condition and plan of care including tests being ordered have been discussed with the patient who indicates understanding and agrees with the plan and Code Status. Disposition Plan: The appropriate patient status for this patient is INPATIENT. Inpatient status is judged to be reasonable and necessary in order to provide the required intensity of service to ensure the patient's safety. The patient's presenting symptoms, physical exam findings, and initial radiographic and laboratory data in the context of their chronic comorbidities is felt to place them at high risk for further clinical deterioration. Furthermore, it is not anticipated that the patient will be medically stable for discharge from the hospital within 2 midnights of admission. The following factors support the patient status of inpatient.   " The patient's presenting symptoms include back pain, nausea and vomiting. " The worrisome physical exam findings include lower extremity cramping, diffuse tophi. " The initial radiographic and laboratory data are worrisome because of AKI, electrolyte abnormalities. " The chronic co-morbidities include alcohol abuse, hypertension, diabetes.   * I certify that at the point of admission it is my clinical judgment that the patient will require inpatient hospital care spanning beyond 2 midnights from the point of admission due to high intensity  of service, high risk for further deterioration and high frequency of surveillance required.*   Consultants  . None  Procedures  . None  Time Spent on Admission: 65 minutes  Jae Dire, DO [facility]  10/22/2020, 3:39 PM

## 2020-10-22 NOTE — ED Notes (Signed)
MD in room at this time.

## 2020-10-22 NOTE — ED Triage Notes (Signed)
left flank and left abd pain described as cramping, vomiting, difficulty urinating x 3 days with dribbling

## 2020-10-22 NOTE — ED Provider Notes (Signed)
[facility]-EMERGENCY DEPT Provider Note   CSN: [id number] Arrival date & time: 10/22/20  1014     History Chief complaint: Back pain  Richard Lopez is a 44 y.o. male.  HPI   Patient presents to the ED for evaluation of complaints of nausea vomiting and back pain.  Patient states symptoms started a few days ago.  He is having pain in his back primarily on the left side.  Is also had some burning upper abdominal discomfort.  Pain in the back is sharp and at times is severe.  Patient also has had nausea and vomiting.  He has not been able to eat or drink much in the last couple of days.  Patient has noticed some decreased urine output as well.  Patient states he has had some coughing.  He did notice one episode of pink sputum.  He is not feeling short of breath.  No known fevers.  Patient states he has a history of hypertension but is not currently taking any medications.  Patient states his doctor was going to prescribe him medications but then when they checked it it was normal.  Patient does not alcohol use but states only every once in a while.  Not daily  Past Medical History:  Diagnosis Date  . Anxiety   . 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 Depression   . Diabetes mellitus    diet controlled  . Essential hypertension   . Gastritis   . Gout   . Normocytic anemia due to blood loss 08/21/2019  . Pulmonary embolism [facility])     Patient Active Problem List   Diagnosis Date Noted  . AF (paroxysmal atrial fibrillation) (HCC)   . Transaminitis   . Dehydration   . AKI (acute kidney injury) (HCC) 06/18/2020  . Atrial fibrillation with RVR (HCC) 04/22/2020  . 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  . Hyponatremia 04/21/2020  . Aspiration pneumonia (HCC) 04/21/2020  . Esophagitis determined by endoscopy 08/21/2019  . Anemia 08/21/2019  . Hypokalemia 08/17/2019  . Hypomagnesemia 08/17/2019  . Hemoptysis 08/17/2019  . Acute upper GI bleed 08/14/2019  .  Hematemesis 08/13/2019  . Cavitary lesion of lung 08/01/2019  . Gout 05/18/2019  . Peri-prosthetic fracture of femur at tip of prosthesis 05/16/2019  . Alcohol use 05/16/2019  . Intertrochanteric fracture of right hip (HCC) 04/14/2019  . Vitamin D deficiency 04/14/2019  . DTs (delirium tremens) (HCC) 04/07/2019  . Delirium tremens (HCC) 04/07/2019  . Encephalopathy acute   . Closed right hip fracture, initial encounter (HCC) 03/25/2019  . Alcohol dependence with intoxication (HCC) 03/25/2019  . Hypertensive urgency 03/25/2019  . Thrombocytopenia (HCC) 03/25/2019  . Effusion, right knee 03/25/2019  . Closed fracture of right femur (HCC)   . High anion gap metabolic acidosis   . Prediabetes 04/02/2017  . Gastritis 04/02/2017  . Marijuana abuse 04/01/2017  . Nausea with vomiting 07/29/2014  . Type 2 diabetes mellitus with hyperlipidemia (HCC) 07/29/2014  . Essential hypertension 07/29/2014  . Nausea & vomiting 07/29/2014    Past Surgical History:  Procedure Laterality Date  . ESOPHAGOGASTRODUODENOSCOPY (EGD) WITH PROPOFOL N/A 08/14/2019   Procedure: ESOPHAGOGASTRODUODENOSCOPY (EGD) WITH PROPOFOL;  Surgeon: Charlott Rakes, MD;  Location: [facility];  Service: Endoscopy;  Laterality: N/A;  . EXTERNAL FIXATION LEG Left 11/07/2018   Procedure: EXTERNAL FIXATION LEFT LOWER LEG;  Surgeon: Samson Frederic, MD;  Location: [facility];  Service: Orthopedics;  Laterality: Left;  . EXTERNAL FIXATION REMOVAL Left  11/08/2018   Procedure: REMOVAL EXTERNAL FIXATION LEG;  Surgeon: Roby Lofts, MD;  Location: [facility] OR;  Service: Orthopedics;  Laterality: Left;  . INTRAMEDULLARY (IM) NAIL INTERTROCHANTERIC Right 03/26/2019   Procedure: INTRAMEDULLARY (IM) NAIL INTERTROCHANTRIC;  Surgeon: Roby Lofts, MD;  Location: [facility] OR;  Service: Orthopedics;  Laterality: Right;  . INTRAMEDULLARY (IM) NAIL INTERTROCHANTERIC Right 05/17/2019   Procedure: Intramedullary (Im) Nail Intertroch with circlage wiring;   Surgeon: Myrene Galas, MD;  Location: [facility] OR;  Service: Orthopedics;  Laterality: Right;  . NO PAST SURGERIES    . OPEN REDUCTION INTERNAL FIXATION (ORIF) TIBIA/FIBULA FRACTURE Left 11/08/2018   Procedure: OPEN REDUCTION INTERNAL FIXATION (ORIF) TIBIA/FIBULA FRACTURE;  Surgeon: Roby Lofts, MD;  Location: [facility] OR;  Service: Orthopedics;  Laterality: Left;  . ORIF FEMUR FRACTURE Right 05/17/2019   Procedure: REMOVAL  OF HARDWARE;  Surgeon: Myrene Galas, MD;  Location: [facility] OR;  Service: Orthopedics;  Laterality: Right;       Family History  Problem Relation Age of Onset  . Diabetes Mellitus II Father   . Diabetes Mellitus II Other   . CAD Other     Social History   Tobacco Use  . Smoking status: Former Smoker    Packs/day: 0.00    Types: Cigarettes  . Smokeless tobacco: Never Used  . Tobacco comment: About 1 cigarette per day or less  Vaping Use  . Vaping Use: Never used  Substance Use Topics  . Alcohol use: Yes    Alcohol/week: 200.0 standard drinks    Types: 200 Cans of beer per week  . Drug use: Yes    Types: Marijuana    Home Medications Prior to Admission medications   Medication Sig Start Date End Date Taking? Authorizing Provider  acetaminophen (TYLENOL) 325 MG tablet Take 650 mg by mouth every 6 (six) hours as needed for mild pain, fever or headache.   Yes [provider]  diclofenac Sodium (VOLTAREN) 1 % GEL Apply 2 g topically in the morning, at noon, and at bedtime. Apply to both knees. Patient taking differently: Apply 2 g topically 3 (three) times daily as needed (pain). Apply to both knees. 04/23/20  Yes Arrien, York Ram, MD  omeprazole (PRILOSEC) 20 MG capsule Take 20 mg by mouth daily.   Yes [provider]  diltiazem (CARDIZEM CD) 120 MG 24 hr capsule Take 1 capsule (120 mg total) by mouth daily. Patient not taking: Reported on 10/22/2020 06/21/20   Rodolph Bong, MD  folic acid (FOLVITE) 1 MG tablet Take 1 tablet (1 mg total) by  mouth daily. Patient not taking: Reported on 10/22/2020 06/22/20   Rodolph Bong, MD  hydrOXYzine (ATARAX/VISTARIL) 25 MG tablet Take 1 tablet (25 mg total) by mouth 3 (three) times daily as needed for itching or anxiety. Patient not taking: No sig reported 06/21/20   Rodolph Bong, MD  metoprolol tartrate 75 MG TABS Take 75 mg by mouth 2 (two) times daily. Patient not taking: No sig reported 06/21/20 06/21/21  Rodolph Bong, MD  pantoprazole (PROTONIX) 40 MG tablet Take 1 tablet (40 mg total) by mouth 2 (two) times daily. Patient not taking: Reported on 10/22/2020 06/21/20   Rodolph Bong, MD  sucralfate (CARAFATE) 1 GM/10ML suspension Take 10 mLs (1 g total) by mouth 4 (four) times daily -  with meals and at bedtime. Patient not taking: Reported on 10/22/2020 06/21/20   Rodolph Bong, MD  thiamine 100 MG tablet Take 1 tablet (  100 mg total) by mouth daily. Patient not taking: Reported on 10/22/2020 06/22/20   Rodolph Bong, MD    Allergies    Patient has no known allergies.  Review of Systems   Review of Systems  All other systems reviewed and are negative.   Physical Exam Updated Vital Signs BP (!) 164/110   Pulse 83   Temp 97.9 F (36.6 C) (Oral)   Resp 15   Ht 1.981 m (6\' 6" )   Wt 104.3 kg   SpO2 95%   BMI 26.58 kg/m   Physical Exam Vitals and nursing note reviewed.  Constitutional:      General: He is not in acute distress.    Appearance: He is well-developed and well-nourished. He is not diaphoretic.  HENT:     Head: Normocephalic and atraumatic.     Right Ear: External ear normal.     Left Ear: External ear normal.  Eyes:     General: No scleral icterus.       Right eye: No discharge.        Left eye: No discharge.     Conjunctiva/sclera: Conjunctivae normal.  Neck:     Trachea: No tracheal deviation.  Cardiovascular:     Rate and Rhythm: Normal rate and regular rhythm.     Pulses: Intact distal pulses.  Pulmonary:     Effort:  Pulmonary effort is normal. No respiratory distress.     Breath sounds: Normal breath sounds. No stridor. No wheezing or rales.  Abdominal:     General: Bowel sounds are normal. There is no distension.     Palpations: Abdomen is soft.     Tenderness: There is abdominal tenderness in the epigastric area. There is left CVA tenderness. There is no guarding or rebound.     Comments: Abdomen soft  Musculoskeletal:        General: No tenderness or edema.     Cervical back: Neck supple.  Skin:    General: Skin is warm and dry.     Findings: No rash.  Neurological:     Mental Status: He is alert.     Cranial Nerves: No cranial nerve deficit (no facial droop, extraocular movements intact, no slurred speech).     Sensory: No sensory deficit.     Motor: No abnormal muscle tone or seizure activity.     Coordination: Coordination normal.     Deep Tendon Reflexes: Strength normal.  Psychiatric:        Mood and Affect: Mood and affect normal.     ED Results / Procedures / Treatments   Labs (all labs ordered are listed, but only abnormal results are displayed) Labs Reviewed  COMPREHENSIVE METABOLIC PANEL - Abnormal; Notable for the following components:      Result Value   Sodium 126 (*)    Potassium 3.0 (*)    Chloride 81 (*)    Glucose, Bld 222 (*)    Creatinine, Ser 2.59 (*)    Total Protein 9.3 (*)    AST 66 (*)    Total Bilirubin 1.3 (*)    GFR, Estimated 31 (*)    Anion gap 21 (*)    All other components within normal limits  CBC WITH DIFFERENTIAL/PLATELET - Abnormal; Notable for the following components:   Monocytes Absolute 1.1 (*)    All other components within normal limits  RESP PANEL BY RT-PCR (FLU A&B, COVID) ARPGX2  LIPASE, BLOOD  URINALYSIS, ROUTINE W REFLEX MICROSCOPIC  EKG EKG Interpretation  Date/Time:  Monday October 22 2020 12:44:14 EST Ventricular Rate:  83 PR Interval:    QRS Duration: 94 QT Interval:  404 QTC Calculation: 475 R Axis:   61 Text  Interpretation: Sinus rhythm Atrial premature complex Right atrial enlargement Borderline repolarization abnormality Minimal ST elevation, lateral leads Artifact in lead(s) I III aVL No significant change since last tracing Confirmed by Linwood Dibbles [phone]) on 10/22/2020 12:49:53 PM Also confirmed by Linwood Dibbles [phone]), editor Elita Quick [phone])  on 10/22/2020 2:31:10 PM   Radiology DG Abd Acute W/Chest  Result Date: 10/22/2020 CLINICAL DATA:  Abdominal and back pain with cough. Left flank pain with difficulty urinating for 3 days. EXAM: DG ABDOMEN ACUTE WITH 1 VIEW CHEST COMPARISON:  Radiographs 06/29/2009 and 06/18/2020. Chest CTA 04/17/2020 FINDINGS: The heart size and mediastinal contours are stable. The lungs are clear. There is blunting of the right costophrenic angle which may reflect a small right pleural effusion. No evidence of left pleural effusion or pneumothorax. The bowel gas pattern is normal. There is no evidence of free intraperitoneal air. There are no suspicious abdominal calcifications. Tiny left pelvic calcification is unchanged, likely a phlebolith. Mild degenerative changes are present throughout the spine. Patient is status post proximal right femoral ORIF. IMPRESSION: 1. Possible small right pleural effusion. 2. No acute abdominal findings. Electronically Signed   By: Carey Bullocks M.D.   On: 10/22/2020 12:09    Procedures Procedures (including critical care time)  Medications Ordered in ED Medications  sodium chloride 0.9 % bolus 1,000 mL (1,000 mLs Intravenous New Bag/Given 10/22/20 1306)    Followed by  sodium chloride 0.9 % bolus 1,000 mL (1,000 mLs Intravenous New Bag/Given 10/22/20 1309)    Followed by  0.9 %  sodium chloride infusion (has no administration in time range)  HYDROmorphone (DILAUDID) injection 1 mg (1 mg Intravenous Given 10/22/20 1107)  ondansetron (ZOFRAN) injection 4 mg (4 mg Intravenous Given 10/22/20 1108)    ED Course  I have  reviewed the triage vital signs and the nursing notes.  Pertinent labs & imaging results that were available during my care of the patient were reviewed by me and considered in my medical decision making (see chart for details).  Clinical Course as of 10/22/20 1439  Mon Oct 22, 2020  1047 Vitals reviewed.  Hypertension noted [JK]  1249 Labs reviewed.  Covid test is negative.  CBC is normal [JK]  1250 Hyponatremia and acute kidney injury is new compared to previous values [JK]  1251 No acute findings noted on chest x-ray [JK]  1252 Bladder scan showed 0 mL [JK]  1428 Blood pressure has improved with pain management.  IV fluids infusing [JK]    Clinical Course User Index [JK] Linwood Dibbles, MD   MDM Rules/Calculators/A&P                         Patient presented to ED for evaluation of nausea vomiting dehydration and back pain.  Patient was also noted to be hypertensive.  It has improved somewhat with pain medications but he still remains hypertensive.  Patient thought it was necessary on medications any longer but I think there was a misunderstanding on his port.  Medical records indicate he still supposed to be taking blood pressure medications.  Patient's abdominal exam is benign.  No signs of pancreatitis.  No signs of obstruction on x-rays.  Does not have a white count.  I doubt diverticulitis  or colitis.  Did consider UTI as well as ureteral colic.  Urinalysis still pending.  I have ordered a renal ultrasound to assess for signs of obstruction or hydronephrosis.  Considering his hypertension and AKI think the patient will need admission to hospital for IV hydration and further management.  I will consult with the medical service. Final Clinical Impression(s) / ED Diagnoses Final diagnoses:  AKI (acute kidney injury) (HCC)  Hypertension, unspecified type      Linwood Dibbles, MD 10/22/20 1439

## 2020-10-23 LAB — GLUCOSE, CAPILLARY
Glucose-Capillary: 106 mg/dL — ABNORMAL HIGH (ref 70–99)
Glucose-Capillary: 116 mg/dL — ABNORMAL HIGH (ref 70–99)
Glucose-Capillary: 120 mg/dL — ABNORMAL HIGH (ref 70–99)
Glucose-Capillary: 127 mg/dL — ABNORMAL HIGH (ref 70–99)
Glucose-Capillary: 128 mg/dL — ABNORMAL HIGH (ref 70–99)
Glucose-Capillary: 91 mg/dL (ref 70–99)

## 2020-10-23 LAB — OSMOLALITY, URINE: Osmolality, Ur: 154 mOsm/kg — ABNORMAL LOW (ref 300–900)

## 2020-10-23 LAB — BASIC METABOLIC PANEL
Anion gap: 12 (ref 5–15)
Anion gap: 11 (ref 5–15)
BUN: 12 mg/dL (ref 6–20)
BUN: 11 mg/dL (ref 6–20)
CO2: 23 mmol/L (ref 22–32)
CO2: 23 mmol/L (ref 22–32)
Calcium: 7.8 mg/dL — ABNORMAL LOW (ref 8.9–10.3)
Calcium: 7.7 mg/dL — ABNORMAL LOW (ref 8.9–10.3)
Chloride: 90 mmol/L — ABNORMAL LOW (ref 98–111)
Chloride: 93 mmol/L — ABNORMAL LOW (ref 98–111)
Creatinine, Ser: 1.36 mg/dL — ABNORMAL HIGH (ref 0.61–1.24)
Creatinine, Ser: 1.18 mg/dL (ref 0.61–1.24)
GFR, Estimated: >60 mL/min (ref >60)
GFR, Estimated: >60 mL/min (ref >60)
Glucose, Bld: 122 mg/dL — ABNORMAL HIGH (ref 70–99)
Glucose, Bld: 234 mg/dL — ABNORMAL HIGH (ref 70–99)
Potassium: 2.9 mmol/L — ABNORMAL LOW (ref 3.5–5.1)
Potassium: 3.3 mmol/L — ABNORMAL LOW (ref 3.5–5.1)
Sodium: 125 mmol/L — ABNORMAL LOW (ref 135–145)
Sodium: 127 mmol/L — ABNORMAL LOW (ref 135–145)

## 2020-10-23 LAB — CBC
HCT: 34.2 % — ABNORMAL LOW (ref 39.0–52.0)
Hemoglobin: 11.7 g/dL — ABNORMAL LOW (ref 13.0–17.0)
MCH: 30.6 pg (ref 26.0–34.0)
MCHC: 34.2 g/dL (ref 30.0–36.0)
MCV: 89.5 fL (ref 80.0–100.0)
Platelets: 154 10*3/uL (ref 150–400)
RBC: 3.82 MIL/uL — ABNORMAL LOW (ref 4.22–5.81)
RDW: 14 % (ref 11.5–15.5)
WBC: 4.9 10*3/uL (ref 4.0–10.5)
nRBC: 0 % (ref 0.0–0.2)

## 2020-10-23 LAB — SODIUM, URINE, RANDOM: Sodium, Ur: <10 mmol/L

## 2020-10-23 LAB — CREATININE, URINE, RANDOM: Creatinine, Urine: 72 mg/dL

## 2020-10-23 LAB — CORTISOL: Cortisol, Plasma: 17.6 ug/dL

## 2020-10-23 LAB — OSMOLALITY: Osmolality: 271 mOsm/kg — ABNORMAL LOW (ref 275–295)

## 2020-10-23 LAB — TSH: TSH: 0.429 u[IU]/mL (ref 0.350–4.500)

## 2020-10-23 LAB — MAGNESIUM: Magnesium: 1.8 mg/dL (ref 1.7–2.4)

## 2020-10-23 MED ORDER — DICLOFENAC SODIUM 1 % EX GEL
2.0000 g | Freq: Three times a day (TID) | CUTANEOUS | Status: DC | PRN
  Administered 2020-10-23: 09:00:00 2 g via TOPICAL
  Filled 2020-10-23: qty 100

## 2020-10-23 MED ORDER — INSULIN ASPART 100 UNIT/ML ~~LOC~~ SOLN
0.0000 [IU] | Freq: Three times a day (TID) | SUBCUTANEOUS | Status: DC
  Administered 2020-10-23: 16:00:00 1 [IU] via SUBCUTANEOUS

## 2020-10-23 MED ORDER — POTASSIUM CHLORIDE CRYS ER 20 MEQ PO TBCR
40.0000 meq | EXTENDED_RELEASE_TABLET | Freq: Once | ORAL | Status: AC
  Administered 2020-10-23: 16:00:00 40 meq via ORAL
  Filled 2020-10-23: qty 2

## 2020-10-23 MED ORDER — SODIUM CHLORIDE 0.9 % IV BOLUS
1000.0000 mL | Freq: Once | INTRAVENOUS | Status: AC
  Administered 2020-10-23: 16:00:00 1000 mL via INTRAVENOUS

## 2020-10-23 MED ORDER — MAGNESIUM SULFATE 2 GM/50ML IV SOLN
2.0000 g | Freq: Once | INTRAVENOUS | Status: AC
  Administered 2020-10-23: 14:00:00 2 g via INTRAVENOUS
  Filled 2020-10-23: qty 50

## 2020-10-23 MED ORDER — PROSIGHT PO TABS
1.0000 | ORAL_TABLET | Freq: Every day | ORAL | Status: DC
  Administered 2020-10-23 – 2020-10-24 (×2): 1 via ORAL
  Filled 2020-10-23 (×2): qty 1

## 2020-10-23 MED ORDER — POTASSIUM CHLORIDE CRYS ER 20 MEQ PO TBCR
40.0000 meq | EXTENDED_RELEASE_TABLET | ORAL | Status: AC
  Administered 2020-10-23 (×2): 40 meq via ORAL
  Filled 2020-10-23 (×2): qty 2

## 2020-10-23 NOTE — Progress Notes (Addendum)
PROGRESS NOTE    Richard Lopez  WOE:[id number] DOB: [birth year] DOA: 10/22/2020 PCP: [name], NP    Chief Complaint  Patient presents with  . Urinary Retention    Brief Narrative: HPI per Dr. [name] This is a 44 year old male with past medical history of paroxysmal atrial fibrillation not on anticoagulation, alcohol abuse, delirium tremens, gastritis, hypertension, tophaceous gout, diet-controlled diabetes, depression, right hip fracture with subsequent provoked DVT/PE in 2020 who presented to the ED with complaints of nausea, vomiting and left back pain for several days.  He states that he used to drink very heavily but had cut back significantly several months ago.  2 weeks ago he started drinking heavily again, about a cup of vodka and a few beers daily.  He only drank this amount for about 1 week then stop drinking alcohol.  He denies any withdrawal symptoms since stopping.  He was doing well until this past Friday when he had nausea and vomiting which persisted throughout the weekend.  He has been unable to tolerate p.o. intake since then.  Also has mid/left-sided back pain without a history of nephrolithiasis.  Associated with decreased urine output and also with bilateral lower extremity cramping.  No known fevers, chest pain, shortness of breath.  He was hypertensive in the ED (213/119).  He states that he never got a call regarding his refills for his antihypertensives and has been without any medications for an unknown period of time but believes it has been several months.  States he was supposed to have a follow-up with his PCP sometime this week.  ED Course: Afebrile, hypertensive, on room air. Notable Labs: Sodium 126, K3.0, chloride 81, glucose 222, BUN 15, creatinine 2.59, AG 21, lipase 44, AST 66, ALT 33, T bili 1.3, WBC 7.3, Hb 14.5, platelets 205, COVID-19 and flu negative. Notable Imaging: Chest and abdomen x-ray-possible small right pleural effusion, no acute  abdominal findings, renal US pending. Patient received Dilaudid, Zofran, 2 L NS bolus and maintenance fluid  Assessment & Plan:   Principal Problem:   AKI (acute kidney injury) (HCC) Active Problems:   Type 2 diabetes mellitus with hyperlipidemia (HCC)   Hypertensive urgency   Alcohol use   Hypokalemia   Hyponatremia   Dehydration   AF (paroxysmal atrial fibrillation) (HCC)  1 acute kidney injury secondary to GI losses and hemodynamic changes associated with oliguria Patient had presented with oliguria noted to have a creatinine of 2.59 on presentation.  Baseline creatinine 0.72.  Patient had presented with nausea and emesis with poor oral intake.  Likely a prerenal azotemia.  Renal function improving with hydration.  Urinalysis cloudy, nitrite negative, leukocytes negative, 6-10 WBCs, rare bacteria.  Check a urine sodium, urine creatinine.  Continue IV fluids.  Follow.  2.  Hypokalemia Likely secondary to GI losses.  Check a magnesium level.  K. Dur 40 mEq p.o. every 4 hours x2 doses.  Follow.  3.  Hyponatremia Likely secondary to hypovolemic hyponatremia secondary to nausea and vomiting/poor oral intake and dehydration.  Check urine osmolality.  Check a serum osmolality.  Check urine sodium.  Check a urine creatinine, TSH, cortisol.  Continue IV fluids.  Repeat labs this afternoon.  Follow.  4.  Hypertensive urgency Likely multifactorial secondary to noncompliance with medication and pain.  Patient placed back on home regimen of Lopressor.  Blood pressure improved.  Follow.  5.  Paroxysmal atrial fibrillation CHA2DS2VASC score 4 ( HTN, history of thromboembolism, diabetes) Continue Lopressor for rate control.  Continue to hold long-acting Cardizem for now.  Consider anticoagulation prior to discharge.  Follow.  6.  Left flank pain Likely musculoskeletal in nature.  Abdominal films unremarkable.  Renal ultrasound unremarkable.  Patient with no hematuria.  Patient with no prior history  of nephrolithiasis.  Heating pad.  Pain management.  Follow.  7.  Nausea and vomiting likely viral etiology versus alcohol induced Improving.  Patient noted had an episode of emesis last night.  Currently tolerating clear liquids.  Replete electrolytes.  Continue IV fluids.  Antiemetics.  Advance to full liquid diet to see if patient tolerates.  Follow.  8.  Bilateral lower extremity cramping Likely secondary to electrolyte derangements.  Replete potassium.  Keep magnesium > 2.  Follow.  9.  History of alcohol abuse Patient stated last alcoholic drink was approximately a week ago prior to admission.  Patient with no signs or symptoms of withdrawal.  Alcohol cessation encouraged.  Continue thiamine, folic acid, multivitamin.  Consult with TOC.  10.  Well-controlled diabetes mellitus type 2 Hemoglobin A1c 5.4.  Change sliding scale insulin to sensitive scale.  Outpatient follow-up with PCP.    DVT prophylaxis: Heparin Code Status: Full Family Communication: Updated patient.  No family at bedside. Disposition:   Status is: Inpatient    Dispo: The patient is from: Home              Anticipated d/c is to: Home              Anticipated d/c date is: 1 to 2 days.              Patient currently on IV fluids, hyponatremic, dehydrated, not stable for discharge.       Consultants:   None  Procedures:   Acute abdominal series 10/22/2020  Antimicrobials:   None   Subjective: Patient stating urine output starting to pick up and last night was the first time he had urine output over the past few days.  Denies any chest pain or shortness of breath.  Still with some left flank pain which worsens with movement.  Denies any hematuria.  Denies any prior history of kidney stones.  Denies any dysuria.  Patient states improvement with lower extremity cramping.  Objective: Vitals:   10/22/20 1535 10/22/20 2057 10/23/20 0002 10/23/20 0359  BP: (!) 150/111 124/89 125/77 113/66  Pulse: 85  77 64 (!) 59  Resp: 16 16 16 16   Temp:  98 F (36.7 C) 98.6 F (37 C) 98 F (36.7 C)  TempSrc: Oral Oral Oral Oral  SpO2: 100% 100% 100% 99%  Weight:      Height:        Intake/Output Summary (Last 24 hours) at 10/23/2020 1321 Last data filed at 10/23/2020 1217 Gross per 24 hour  Intake 2574.29 ml  Output 1125 ml  Net 1449.29 ml   Filed Weights   10/22/20 1034 10/22/20 1500  Weight: 104.3 kg 97.5 kg    Examination:  General exam: Appears calm and comfortable  Respiratory system: Clear to auscultation. Respiratory effort normal. Cardiovascular system: Bradycardia.  No JVD, no murmurs rubs or gallops.  No lower extremity edema.  Gastrointestinal system: Abdomen is nondistended, soft and nontender. No organomegaly or masses felt. Normal bowel sounds heard. Central nervous system: Alert and oriented. No focal neurological deficits. Extremities: Symmetric 5 x 5 power. Skin: No rashes, lesions or ulcers Psychiatry: Judgement and insight appear normal. Mood & affect appropriate.     Data Reviewed: I have  personally reviewed following labs and imaging studies  CBC: Recent Labs  Lab 10/22/20 1105 10/23/20 0528  WBC 7.3 4.9  NEUTROABS 4.6  --   HGB 14.5 11.7*  HCT 40.8 34.2*  MCV 85.9 89.5  PLT 205 154    Basic Metabolic Panel: Recent Labs  Lab 10/22/20 1105 10/23/20 0528 10/23/20 0858  NA 126* 125*  --   K 3.0* 2.9*  --   CL 81* 90*  --   CO2 24 23  --   GLUCOSE 222* 122*  --   BUN 15 12  --   CREATININE 2.59* 1.36*  --   CALCIUM 9.4 7.8*  --   MG 1.6*  --  1.8    GFR: Estimated Creatinine Clearance: 90.5 mL/min (A) (by C-G formula based on SCr of 1.36 mg/dL (H)).  Liver Function Tests: Recent Labs  Lab 10/22/20 1105  AST 66*  ALT 33  ALKPHOS 92  BILITOT 1.3*  PROT 9.3*  ALBUMIN 4.4    CBG: Recent Labs  Lab 10/22/20 2102 10/23/20 0004 10/23/20 0409 10/23/20 0802 10/23/20 1215  GLUCAP 97 91 116* 127* 120*     Recent Results (from  the past 240 hour(s))  Resp Panel by RT-PCR (Flu A&B, Covid) Nasopharyngeal Swab     Status: None   Collection Time: 10/22/20 11:05 AM   Specimen: Nasopharyngeal Swab; Nasopharyngeal(NP) swabs in vial transport medium  Result Value Ref Range Status   SARS Coronavirus 2 by RT PCR NEGATIVE NEGATIVE Final    Comment: (NOTE) SARS-CoV-2 target nucleic acids are NOT DETECTED.  The SARS-CoV-2 RNA is generally detectable in upper respiratory specimens during the acute phase of infection. The lowest concentration of SARS-CoV-2 viral copies this assay can detect is 138 copies/mL. A negative result does not preclude SARS-Cov-2 infection and should not be used as the sole basis for treatment or other patient management decisions. A negative result may occur with  improper specimen collection/handling, submission of specimen other than nasopharyngeal swab, presence of viral mutation(s) within the areas targeted by this assay, and inadequate number of viral copies(<138 copies/mL). A negative result must be combined with clinical observations, patient history, and epidemiological information. The expected result is Negative.  Fact Sheet for Patients:  BloggerCourse.com  Fact Sheet for Healthcare Providers:  SeriousBroker.it  This test is no t yet approved or cleared by the [place] FDA and  has been authorized for detection and/or diagnosis of SARS-CoV-2 by FDA under an Emergency Use Authorization (EUA). This EUA will remain  in effect (meaning this test can be used) for the duration of the COVID-19 declaration under Section 564(b)(1) of the Act, 21 U.S.C.section 360bbb-3(b)(1), unless the authorization is terminated  or revoked sooner.       Influenza A by PCR NEGATIVE NEGATIVE Final   Influenza B by PCR NEGATIVE NEGATIVE Final    Comment: (NOTE) The Xpert Xpress SARS-CoV-2/FLU/RSV plus assay is intended as an aid in the diagnosis of  influenza from Nasopharyngeal swab specimens and should not be used as a sole basis for treatment. Nasal washings and aspirates are unacceptable for Xpert Xpress SARS-CoV-2/FLU/RSV testing.  Fact Sheet for Patients: BloggerCourse.com  Fact Sheet for Healthcare Providers: SeriousBroker.it  This test is not yet approved or cleared by the [place] FDA and has been authorized for detection and/or diagnosis of SARS-CoV-2 by FDA under an Emergency Use Authorization (EUA). This EUA will remain in effect (meaning this test can be used) for the duration of the COVID-19 declaration  under Section 564(b)(1) of the Act, 21 U.S.C. section 360bbb-3(b)(1), unless the authorization is terminated or revoked.  Performed at [facility], [address], [place], [place] [address]          Radiology Studies: US Renal  Result Date: 10/22/2020 CLINICAL DATA:  Decreased urine output EXAM: RENAL / URINARY TRACT ULTRASOUND COMPLETE COMPARISON:  Abdominal ultrasound June 21, 2020 FINDINGS: Right Kidney: Renal measurements: 11.1 x 4.5 x 6.5 cm = volume: 171 mL. Echogenicity and renal cortical thickness are within normal limits. No mass, perinephric fluid, or hydronephrosis visualized. No sonographically demonstrable calculus or ureterectasis. Left Kidney: Renal measurements: 11.5 x 5.98 cm = volume: 205 mL. Echogenicity and renal cortical thickness are within normal limits. No mass, perinephric fluid, or hydronephrosis visualized. No sonographically demonstrable calculus or ureterectasis. Bladder: Appears normal for degree of bladder distention. Other: None. IMPRESSION: Study within normal limits. Electronically Signed   By: Bretta Bang III M.D.   On: 10/22/2020 14:42   DG Abd Acute W/Chest  Result Date: 10/22/2020 CLINICAL DATA:  Abdominal and back pain with cough. Left flank pain with difficulty urinating for 3 days. EXAM: DG  ABDOMEN ACUTE WITH 1 VIEW CHEST COMPARISON:  Radiographs 06/29/2009 and 06/18/2020. Chest CTA 04/17/2020 FINDINGS: The heart size and mediastinal contours are stable. The lungs are clear. There is blunting of the right costophrenic angle which may reflect a small right pleural effusion. No evidence of left pleural effusion or pneumothorax. The bowel gas pattern is normal. There is no evidence of free intraperitoneal air. There are no suspicious abdominal calcifications. Tiny left pelvic calcification is unchanged, likely a phlebolith. Mild degenerative changes are present throughout the spine. Patient is status post proximal right femoral ORIF. IMPRESSION: 1. Possible small right pleural effusion. 2. No acute abdominal findings. Electronically Signed   By: Carey Bullocks M.D.   On: 10/22/2020 12:09        Scheduled Meds: . folic acid  1 mg Oral Daily  . heparin  5,000 Units Subcutaneous Q8H  . insulin aspart  0-6 Units Subcutaneous TID WC  . metoprolol tartrate  75 mg Oral BID  . multivitamin  1 tablet Oral Daily  . pantoprazole  40 mg Oral BID  . thiamine  100 mg Oral Daily   Continuous Infusions: . sodium chloride 1,000 mL (10/23/20 0700)  . magnesium sulfate bolus IVPB       LOS: 1 day    Time spent: 35 minutes    Ramiro Harvest, MD [facility]   To contact the attending provider between 7A-7P or the covering provider during after hours 7P-7A, please log into the web site [email or web address] and access using universal [place] password for that web site. If you do not have the password, please call the hospital operator.  10/23/2020, 1:21 PM

## 2020-10-24 ENCOUNTER — Ambulatory Visit (INDEPENDENT_AMBULATORY_CARE_PROVIDER_SITE_OTHER): Payer: Self-pay | Admitting: Primary Care

## 2020-10-24 LAB — COMPREHENSIVE METABOLIC PANEL
ALT: 22 U/L (ref 0–44)
AST: 41 U/L (ref 15–41)
Albumin: 2.8 g/dL — ABNORMAL LOW (ref 3.5–5.0)
Alkaline Phosphatase: 61 U/L (ref 38–126)
Anion gap: 8 (ref 5–15)
BUN: 7 mg/dL (ref 6–20)
CO2: 23 mmol/L (ref 22–32)
Calcium: 8 mg/dL — ABNORMAL LOW (ref 8.9–10.3)
Chloride: 100 mmol/L (ref 98–111)
Creatinine, Ser: 0.95 mg/dL (ref 0.61–1.24)
GFR, Estimated: >60 mL/min (ref >60)
Glucose, Bld: 95 mg/dL (ref 70–99)
Potassium: 3.4 mmol/L — ABNORMAL LOW (ref 3.5–5.1)
Sodium: 131 mmol/L — ABNORMAL LOW (ref 135–145)
Total Bilirubin: 0.8 mg/dL (ref 0.3–1.2)
Total Protein: 6 g/dL — ABNORMAL LOW (ref 6.5–8.1)

## 2020-10-24 LAB — GLUCOSE, CAPILLARY
Glucose-Capillary: 91 mg/dL (ref 70–99)
Glucose-Capillary: 144 mg/dL — ABNORMAL HIGH (ref 70–99)

## 2020-10-24 LAB — CBC
HCT: 33.8 % — ABNORMAL LOW (ref 39.0–52.0)
Hemoglobin: 11.6 g/dL — ABNORMAL LOW (ref 13.0–17.0)
MCH: 31.3 pg (ref 26.0–34.0)
MCHC: 34.3 g/dL (ref 30.0–36.0)
MCV: 91.1 fL (ref 80.0–100.0)
Platelets: 143 10*3/uL — ABNORMAL LOW (ref 150–400)
RBC: 3.71 MIL/uL — ABNORMAL LOW (ref 4.22–5.81)
RDW: 14.3 % (ref 11.5–15.5)
WBC: 4.3 10*3/uL (ref 4.0–10.5)
nRBC: 0 % (ref 0.0–0.2)

## 2020-10-24 LAB — PHOSPHORUS: Phosphorus: 2.1 mg/dL — ABNORMAL LOW (ref 2.5–4.6)

## 2020-10-24 LAB — MAGNESIUM: Magnesium: 2 mg/dL (ref 1.7–2.4)

## 2020-10-24 MED ORDER — POTASSIUM CHLORIDE CRYS ER 20 MEQ PO TBCR
40.0000 meq | EXTENDED_RELEASE_TABLET | Freq: Once | ORAL | Status: AC
  Administered 2020-10-24: 10:00:00 40 meq via ORAL
  Filled 2020-10-24: qty 2

## 2020-10-24 MED ORDER — METOPROLOL TARTRATE 75 MG PO TABS: 75.0000 mg | ORAL_TABLET | Freq: Two times a day (BID) | ORAL | 2 refills | Status: DC

## 2020-10-24 MED FILL — METOPROLOL TARTRATE 75 MG T: 75 | 30 days supply | Qty: 60 | Fill #0

## 2020-10-24 NOTE — TOC Transition Note (Signed)
Transition of Care [facility]) - CM/SW Discharge Note   Patient Details  Name: Richard Lopez MRN: [id number] Date of Birth: 1976/06/15  [facility]) CM/SW Contact:  Bartholome Bill, RN Phone Number: 10/24/2020, 10:48 AM   Clinical Narrative:     TOC consult for ETOH abuse resources. Resources provided to pt.      Readmission Risk Interventions Readmission Risk Prevention Plan 05/19/2019  Transportation Screening Complete  Medication Review [employer]) Complete  PCP or Specialist appointment within 3-5 days of discharge Complete  HRI or Home Care Consult Complete  SW Recovery Care/Counseling Consult Complete  Palliative Care Screening Not Applicable  Skilled Nursing Facility Not Applicable  Some recent data might be hidden

## 2020-10-24 NOTE — Discharge Summary (Signed)
Physician Discharge Summary  Richard Lopez KYH:[id number] DOB: June 28, 1976 DOA: 10/22/2020  PCP: Richard Sessions, NP  Admit date: 10/22/2020 Discharge date: 10/24/2020  Admitted From: Home Disposition:  Home   Recommendations for Outpatient Follow-up:  1. Follow up with PCP in 1 week  Discharge Condition: Stable CODE STATUS: Full  Diet recommendation: Heart healthy   Brief/Interim Summary:  HPI per Dr. [name] This is a 44 year old male with past medical history of paroxysmal atrial fibrillation not on anticoagulation, alcohol abuse, delirium tremens,gastritis, hypertension, tophaceous gout,diet-controlled diabetes, depression, right hip fracture with subsequent provoked DVT/PE in 2020 who presented to the ED with complaints of nausea, vomiting and left back pain for several days.He states that he used to drink very heavily but had cut back significantly several months ago. 2 weeks ago he started drinking heavily again, about a cup of vodka and a few beers daily. He only drank this amount for about 1 week then stop drinking alcohol. He denies any withdrawal symptoms since stopping. He was doing well until this past Friday when he had nausea and vomiting which persisted throughout the weekend. He has been unable to tolerate p.o. intake since then. Also has mid/left-sided back pain without a history of nephrolithiasis. Associated with decreased urine outputand also with bilateral lower extremity cramping. No known fevers, chest pain,shortness of breath.  He was hypertensive in the ED (213/119). He states that he never got a call regarding his refills for his antihypertensives and has been without any medications for an unknown period of time but believes it has been several months. States he was supposed to have a follow-up with his PCP sometime this week.  ED Course:Afebrile, hypertensive, on room air. Notable Labs:Sodium 126, K3.0, chloride 81, glucose 222, BUN 15,  creatinine 2.59, AG 21, lipase 44, AST 66, ALT 33, T bili 1.3, WBC 7.3, Hb 14.5, platelets 205, COVID-19 and flu negative. Notable Imaging:Chest and abdomen x-ray-possible small right pleural effusion, no acute abdominal findings, renal US pending. Patient receivedDilaudid, Zofran, 2 L NS bolus and maintenance fluid  Interim: Patient was managed with supportive care.  He was given IV fluids with improvement of his kidney function.  His nausea and vomiting resolved and patient was advanced on his diet.  Potassium was replaced prior to discharge.  Discharge Diagnoses:  Principal Problem:   AKI (acute kidney injury) (HCC) Active Problems:   Type 2 diabetes mellitus with hyperlipidemia (HCC)   Hypertensive urgency   Alcohol use   Hypokalemia   Hyponatremia   Dehydration   AF (paroxysmal atrial fibrillation) (HCC)   Acute kidney injury secondary to GI losses and hemodynamic changes associated with oliguria Patient had presented with oliguria noted to have a creatinine of 2.59 on presentation.  Baseline creatinine 0.72.  Patient had presented with nausea and emesis with poor oral intake.  Likely a prerenal azotemia.  Resolved   Hypokalemia Replaced  Hyponatremia Improved   Hypertensive urgency Likely multifactorial secondary to noncompliance with medication and pain.  Patient placed back on home regimen of Lopressor.    Improved  Paroxysmal atrial fibrillation CHA2DS2VASC score 4 ( HTN, history of thromboembolism, diabetes) Continue Lopressor for rate control.  Defer initiation of anticoagulation to PCP.  Patient has history of noncompliance with medications which complicates his treatment plan  Left flank pain Likely musculoskeletal in nature.  Abdominal films unremarkable.  Renal ultrasound unremarkable.  Patient with no hematuria.  Patient with no prior history of nephrolithiasis.  Improved  Nausea and vomiting Likely viral  etiology versus alcohol induced.   Resolved  Bilateral lower extremity cramping Likely secondary to electrolyte derangements.  Resolved   History of alcohol abuse Patient stated last alcoholic drink was approximately a week ago prior to admission.  Patient with no signs or symptoms of withdrawal.  Alcohol cessation encouraged. TOC consulted.   Well-controlled diabetes mellitus type 2 Hemoglobin A1c 5.4.  Outpatient follow-up with PCP.    Discharge Instructions  Discharge Instructions    Call MD for:  difficulty breathing, headache or visual disturbances   Complete by: As directed    Call MD for:  extreme fatigue   Complete by: As directed    Call MD for:  persistant dizziness or light-headedness   Complete by: As directed    Call MD for:  persistant nausea and vomiting   Complete by: As directed    Call MD for:  severe uncontrolled pain   Complete by: As directed    Call MD for:  temperature >100.4   Complete by: As directed    Discharge instructions   Complete by: As directed    You were cared for by a hospitalist during your hospital stay. If you have any questions about your discharge medications or the care you received while you were in the hospital after you are discharged, you can call the unit and ask to speak with the hospitalist on call if the hospitalist that took care of you is not available. Once you are discharged, your primary care physician will handle any further medical issues. Please note that NO REFILLS for any discharge medications will be authorized once you are discharged, as it is imperative that you return to your primary care physician (or establish a relationship with a primary care physician if you do not have one) for your aftercare needs so that they can reassess your need for medications and monitor your lab values.   Increase activity slowly   Complete by: As directed      Allergies as of 10/24/2020   No Known Allergies     Medication List    STOP taking these medications    diltiazem 120 MG 24 hr capsule Commonly known as: CARDIZEM CD   folic acid 1 MG tablet Commonly known as: FOLVITE   hydrOXYzine 25 MG tablet Commonly known as: ATARAX/VISTARIL   pantoprazole 40 MG tablet Commonly known as: PROTONIX   sucralfate 1 GM/10ML suspension Commonly known as: CARAFATE   thiamine 100 MG tablet     TAKE these medications   acetaminophen 325 MG tablet Commonly known as: TYLENOL Take 650 mg by mouth every 6 (six) hours as needed for mild pain, fever or headache.   diclofenac Sodium 1 % Gel Commonly known as: VOLTAREN Apply 2 g topically in the morning, at noon, and at bedtime. Apply to both knees. What changed:   when to take this  reasons to take this   Metoprolol Tartrate 75 MG Tabs Take 75 mg by mouth 2 (two) times daily.   omeprazole 20 MG capsule Commonly known as: PRILOSEC Take 20 mg by mouth daily.       Follow-up Information    Richard Sessions, NP. Schedule an appointment as soon as possible for a visit in 1 week(s).   Specialty: Internal Medicine Contact information: [id number] [place] [address] [phone]              No Known Allergies  Consultations:  None    Procedures/Studies: US Renal  Result Date:  10/22/2020 CLINICAL DATA:  Decreased urine output EXAM: RENAL / URINARY TRACT ULTRASOUND COMPLETE COMPARISON:  Abdominal ultrasound June 21, 2020 FINDINGS: Right Kidney: Renal measurements: 11.1 x 4.5 x 6.5 cm = volume: 171 mL. Echogenicity and renal cortical thickness are within normal limits. No mass, perinephric fluid, or hydronephrosis visualized. No sonographically demonstrable calculus or ureterectasis. Left Kidney: Renal measurements: 11.5 x 5.98 cm = volume: 205 mL. Echogenicity and renal cortical thickness are within normal limits. No mass, perinephric fluid, or hydronephrosis visualized. No sonographically demonstrable calculus or ureterectasis. Bladder: Appears normal for degree of bladder  distention. Other: None. IMPRESSION: Study within normal limits. Electronically Signed   By: Bretta Bang III M.D.   On: 10/22/2020 14:42   DG Abd Acute W/Chest  Result Date: 10/22/2020 CLINICAL DATA:  Abdominal and back pain with cough. Left flank pain with difficulty urinating for 3 days. EXAM: DG ABDOMEN ACUTE WITH 1 VIEW CHEST COMPARISON:  Radiographs 06/29/2009 and 06/18/2020. Chest CTA 04/17/2020 FINDINGS: The heart size and mediastinal contours are stable. The lungs are clear. There is blunting of the right costophrenic angle which may reflect a small right pleural effusion. No evidence of left pleural effusion or pneumothorax. The bowel gas pattern is normal. There is no evidence of free intraperitoneal air. There are no suspicious abdominal calcifications. Tiny left pelvic calcification is unchanged, likely a phlebolith. Mild degenerative changes are present throughout the spine. Patient is status post proximal right femoral ORIF. IMPRESSION: 1. Possible small right pleural effusion. 2. No acute abdominal findings. Electronically Signed   By: Carey Bullocks M.D.   On: 10/22/2020 12:09       Discharge Exam: Vitals:   10/23/20 2052 10/24/20 0513  BP: 123/80 113/65  Pulse: 65 (!) 59  Resp: 16 16  Temp: 97.6 F (36.4 C) 98.2 F (36.8 C)  SpO2: 100% 100%    General: Pt is alert, awake, not in acute distress Cardiovascular: RRR, S1/S2 +, no edema Respiratory: CTA bilaterally, no wheezing, no rhonchi, no respiratory distress, no conversational dyspnea  Abdominal: Soft, NT, ND, bowel sounds + Extremities: no edema, no cyanosis Psych: Normal mood and affect, stable judgement and insight     The results of significant diagnostics from this hospitalization (including imaging, microbiology, ancillary and laboratory) are listed below for reference.     Microbiology: Recent Results (from the past 240 hour(s))  Resp Panel by RT-PCR (Flu A&B, Covid) Nasopharyngeal Swab     Status:  None   Collection Time: 10/22/20 11:05 AM   Specimen: Nasopharyngeal Swab; Nasopharyngeal(NP) swabs in vial transport medium  Result Value Ref Range Status   SARS Coronavirus 2 by RT PCR NEGATIVE NEGATIVE Final    Comment: (NOTE) SARS-CoV-2 target nucleic acids are NOT DETECTED.  The SARS-CoV-2 RNA is generally detectable in upper respiratory specimens during the acute phase of infection. The lowest concentration of SARS-CoV-2 viral copies this assay can detect is 138 copies/mL. A negative result does not preclude SARS-Cov-2 infection and should not be used as the sole basis for treatment or other patient management decisions. A negative result may occur with  improper specimen collection/handling, submission of specimen other than nasopharyngeal swab, presence of viral mutation(s) within the areas targeted by this assay, and inadequate number of viral copies(<138 copies/mL). A negative result must be combined with clinical observations, patient history, and epidemiological information. The expected result is Negative.  Fact Sheet for Patients:  BloggerCourse.com  Fact Sheet for Healthcare Providers:  SeriousBroker.it  This test is no t  yet approved or cleared by the [place] and  has been authorized for detection and/or diagnosis of SARS-CoV-2 by FDA under an Emergency Use Authorization (EUA). This EUA will remain  in effect (meaning this test can be used) for the duration of the COVID-19 declaration under Section 564(b)(1) of the Act, 21 U.S.C.section 360bbb-3(b)(1), unless the authorization is terminated  or revoked sooner.       Influenza A by PCR NEGATIVE NEGATIVE Final   Influenza B by PCR NEGATIVE NEGATIVE Final    Comment: (NOTE) The Xpert Xpress SARS-CoV-2/FLU/RSV plus assay is intended as an aid in the diagnosis of influenza from Nasopharyngeal swab specimens and should not be used as a sole basis for  treatment. Nasal washings and aspirates are unacceptable for Xpert Xpress SARS-CoV-2/FLU/RSV testing.  Fact Sheet for Patients: BloggerCourse.com  Fact Sheet for Healthcare Providers: SeriousBroker.it  This test is not yet approved or cleared by the [place] FDA and has been authorized for detection and/or diagnosis of SARS-CoV-2 by FDA under an Emergency Use Authorization (EUA). This EUA will remain in effect (meaning this test can be used) for the duration of the COVID-19 declaration under Section 564(b)(1) of the Act, 21 U.S.C. section 360bbb-3(b)(1), unless the authorization is terminated or revoked.  Performed at [facility], [address], [place], [place] [address]      Labs: BNP (last 3 results) No results for input(s): BNP in the last 8760 hours. Basic Metabolic Panel: Recent Labs  Lab 10/22/20 1105 10/23/20 0528 10/23/20 0858 10/23/20 1359 10/24/20 0748  NA 126* 125*  --  127* 131*  K 3.0* 2.9*  --  3.3* 3.4*  CL 81* 90*  --  93* 100  CO2 24 23  --  23 23  GLUCOSE 222* 122*  --  234* 95  BUN 15 12  --  11 7  CREATININE 2.59* 1.36*  --  1.18 0.95  CALCIUM 9.4 7.8*  --  7.7* 8.0*  MG 1.6*  --  1.8  --  2.0  PHOS  --   --   --   --  2.1*   Liver Function Tests: Recent Labs  Lab 10/22/20 1105 10/24/20 0748  AST 66* 41  ALT 33 22  ALKPHOS 92 61  BILITOT 1.3* 0.8  PROT 9.3* 6.0*  ALBUMIN 4.4 2.8*   Recent Labs  Lab 10/22/20 1105  LIPASE 44   No results for input(s): AMMONIA in the last 168 hours. CBC: Recent Labs  Lab 10/22/20 1105 10/23/20 0528 10/24/20 0748  WBC 7.3 4.9 4.3  NEUTROABS 4.6  --   --   HGB 14.5 11.7* 11.6*  HCT 40.8 34.2* 33.8*  MCV 85.9 89.5 91.1  PLT 205 154 143*   Cardiac Enzymes: No results for input(s): CKTOTAL, CKMB, CKMBINDEX, TROPONINI in the last 168 hours. BNP: Invalid input(s): POCBNP CBG: Recent Labs  Lab 10/23/20 1215  10/23/20 1517 10/23/20 2050 10/24/20 0751 10/24/20 1145  GLUCAP 120* 128* 106* 91 144*   D-Dimer No results for input(s): DDIMER in the last 72 hours. Hgb A1c Recent Labs    10/22/20 1105  HGBA1C 5.4   Lipid Profile No results for input(s): CHOL, HDL, LDLCALC, TRIG, CHOLHDL, LDLDIRECT in the last 72 hours. Thyroid function studies Recent Labs    10/23/20 0858  TSH 0.429   Anemia work up No results for input(s): VITAMINB12, FOLATE, FERRITIN, TIBC, IRON, RETICCTPCT in the last 72 hours. Urinalysis    Component Value Date/Time  COLORURINE AMBER (A) 10/22/2020 1800   APPEARANCEUR CLOUDY (A) 10/22/2020 1800   LABSPEC 1.024 10/22/2020 1800   PHURINE 5.0 10/22/2020 1800   GLUCOSEU NEGATIVE 10/22/2020 1800   HGBUR SMALL (A) 10/22/2020 1800   BILIRUBINUR SMALL (A) 10/22/2020 1800   KETONESUR NEGATIVE 10/22/2020 1800   PROTEINUR 100 (A) 10/22/2020 1800   UROBILINOGEN 1.0 07/29/2014 1837   NITRITE NEGATIVE 10/22/2020 1800   LEUKOCYTESUR NEGATIVE 10/22/2020 1800   Sepsis Labs Invalid input(s): PROCALCITONIN,  WBC,  LACTICIDVEN Microbiology Recent Results (from the past 240 hour(s))  Resp Panel by RT-PCR (Flu A&B, Covid) Nasopharyngeal Swab     Status: None   Collection Time: 10/22/20 11:05 AM   Specimen: Nasopharyngeal Swab; Nasopharyngeal(NP) swabs in vial transport medium  Result Value Ref Range Status   SARS Coronavirus 2 by RT PCR NEGATIVE NEGATIVE Final    Comment: (NOTE) SARS-CoV-2 target nucleic acids are NOT DETECTED.  The SARS-CoV-2 RNA is generally detectable in upper respiratory specimens during the acute phase of infection. The lowest concentration of SARS-CoV-2 viral copies this assay can detect is 138 copies/mL. A negative result does not preclude SARS-Cov-2 infection and should not be used as the sole basis for treatment or other patient management decisions. A negative result may occur with  improper specimen collection/handling, submission of specimen  other than nasopharyngeal swab, presence of viral mutation(s) within the areas targeted by this assay, and inadequate number of viral copies(<138 copies/mL). A negative result must be combined with clinical observations, patient history, and epidemiological information. The expected result is Negative.  Fact Sheet for Patients:  BloggerCourse.com  Fact Sheet for Healthcare Providers:  SeriousBroker.it  This test is no t yet approved or cleared by the [place] FDA and  has been authorized for detection and/or diagnosis of SARS-CoV-2 by FDA under an Emergency Use Authorization (EUA). This EUA will remain  in effect (meaning this test can be used) for the duration of the COVID-19 declaration under Section 564(b)(1) of the Act, 21 U.S.C.section 360bbb-3(b)(1), unless the authorization is terminated  or revoked sooner.       Influenza A by PCR NEGATIVE NEGATIVE Final   Influenza B by PCR NEGATIVE NEGATIVE Final    Comment: (NOTE) The Xpert Xpress SARS-CoV-2/FLU/RSV plus assay is intended as an aid in the diagnosis of influenza from Nasopharyngeal swab specimens and should not be used as a sole basis for treatment. Nasal washings and aspirates are unacceptable for Xpert Xpress SARS-CoV-2/FLU/RSV testing.  Fact Sheet for Patients: BloggerCourse.com  Fact Sheet for Healthcare Providers: SeriousBroker.it  This test is not yet approved or cleared by the [place] FDA and has been authorized for detection and/or diagnosis of SARS-CoV-2 by FDA under an Emergency Use Authorization (EUA). This EUA will remain in effect (meaning this test can be used) for the duration of the COVID-19 declaration under Section 564(b)(1) of the Act, 21 U.S.C. section 360bbb-3(b)(1), unless the authorization is terminated or revoked.  Performed at [facility], [address] [place], [place] [address]      Patient was seen and examined on the day of discharge and was found to be in stable condition. Time coordinating discharge: 35 minutes including assessment and coordination of care, as well as examination of the patient.   SIGNED:  Noralee Stain, DO [facility] 10/24/2020, 12:53 PM

## 2020-10-25 ENCOUNTER — Telehealth: Payer: Self-pay

## 2020-10-25 NOTE — Telephone Encounter (Signed)
Transition Care Management Unsuccessful Follow-up Telephone Call  Date of discharge and from where:  10/24/2020, [facility]   Attempts:  1st Attempt  Reason for unsuccessful TCM follow-up call:  Unable to leave message - call placed to # [phone], voicemail not set up.  Patient needs to schedule hospital follow up appointment with PCP

## 2020-10-30 ENCOUNTER — Telehealth: Payer: Self-pay

## 2020-10-30 NOTE — Telephone Encounter (Signed)
Transition Care Management Follow-up Telephone Call  Date of discharge and from where: 10/24/2020, [facility]   How have you been since you were released from the hospital? He reports on going right hip pain. He said that he has made providers aware of this pain but the concern  has not been completely addressed.  He has the voltaren gel and has taken tylenol but the pain has not been relieved. He said that he is not able to afford to see providers because he lacks insurance. He currently has a pending medicaid application which would prohibit him from applying for [employer].  Any questions or concerns? Yes - in addition to above noted concern regarding his hip, he would like an explanation from his provider of why he needs to be on BP medication. He said that his BP was fine at this last physical and that was without medication. This CM explained that his BP was elevated when he was in the hospital and it came down when his meds were started. He then stated  that he does not like how he feels when he is on BP meds. Informed him that his PCP would be notified of his question  Items Reviewed:  Did the pt receive and understand the discharge instructions provided? Yes   Medications obtained and verified? No  - he has the volatren and omeprazole but does not have the metoprolol tartrate.  He would like his question regarding the need for BP med addressed by provider.   Other? No   Any new allergies since your discharge? No   Do you have support at home? No   Home Care and Equipment/Supplies: Were home health services ordered? no If so, what is the name of the agency? n/a Has the agency set up a time to come to the patient's home? n/a Were any new equipment or medical supplies ordered?  No What is the name of the medical supply agency? n/a Were you able to get the supplies/equipment? n/a Do you have any questions related to the use of the equipment or supplies? No  n/a  Functional Questionnaire: (I = Independent and D = Dependent) ADLs: independent. Has can to use when needed  Follow up appointments reviewed:   PCP Hospital f/u appt confirmed? Yes Elwanda [place] 11/07/2020 @ 1430.   Specialist Hospital f/u appt confirmed? No   Are transportation arrangements needed? he sid he will need to arrange transportation because he does not drive.   If their condition worsens, is the pt aware to call PCP or go to the Emergency Dept.? yes  Was the patient provided with contact information for the PCP's office or ED? He said he has the phone number or [facility]  Was to pt encouraged to call back with questions or concerns?    yes

## 2020-11-07 ENCOUNTER — Telehealth (INDEPENDENT_AMBULATORY_CARE_PROVIDER_SITE_OTHER): Payer: Self-pay | Admitting: Primary Care

## 2020-11-07 ENCOUNTER — Other Ambulatory Visit: Payer: Self-pay

## 2020-11-07 ENCOUNTER — Encounter (INDEPENDENT_AMBULATORY_CARE_PROVIDER_SITE_OTHER): Payer: Self-pay | Admitting: Primary Care

## 2020-11-07 ENCOUNTER — Other Ambulatory Visit (INDEPENDENT_AMBULATORY_CARE_PROVIDER_SITE_OTHER): Payer: Self-pay | Admitting: Primary Care

## 2020-11-07 DIAGNOSIS — F4323 Adjustment disorder with mixed anxiety and depressed mood: Secondary | ICD-10-CM

## 2020-11-07 DIAGNOSIS — J309 Allergic rhinitis, unspecified: Secondary | ICD-10-CM

## 2020-11-07 DIAGNOSIS — Z09 Encounter for follow-up examination after completed treatment for conditions other than malignant neoplasm: Secondary | ICD-10-CM

## 2020-11-07 MED ORDER — FLUTICASONE PROPIONATE 50 MCG/ACT NA SUSP: 2.0000 | Freq: Every day | NASAL | 6 refills | Status: DC

## 2020-11-07 MED ORDER — LORATADINE 10 MG PO TABS: 10.0000 mg | ORAL_TABLET | Freq: Every day | ORAL | 11 refills | Status: DC

## 2020-11-07 MED FILL — FLUTICASONE PROP 50 MCG SPR: 50 | 30 days supply | Qty: 16 | Fill #0

## 2020-11-07 NOTE — Progress Notes (Signed)
I connected with  Richard Lopez on 11/07/20 by a video enabled telemedicine application and verified that I am speaking with the correct person using two identifiers.   I discussed the limitations of evaluation and management by telemedicine. The patient expressed understanding and agreed to proceed.Tempestt Budd Palmer, CMA     Pt has pain in hands and hips  He has a head cold

## 2020-11-14 ENCOUNTER — Ambulatory Visit (INDEPENDENT_AMBULATORY_CARE_PROVIDER_SITE_OTHER): Payer: Self-pay | Admitting: *Deleted

## 2020-11-14 NOTE — Telephone Encounter (Signed)
I returned pt's call.   He was "seen" by Richard Passe, NP via televisit on 11/08/2019.  He had a head cold then he said.  She prescribed him some Flonase and another medication (Loratadine per chart).   It has helped however he is c/o very bad left ear pain.   Denies drainage.  He still has a runny nose.   He c/o pain even when he tries to eat or open his mouth wide in the left ear.   He could not sleep last night due to the pain.   He has not tried any Tylenol or ibuprofen because he is out of it.  There are no appts available at [facility] or [facility] and [facility].  He said he can't go to the urgent care because he owes them money so they won't see him.   I suggested the ED however due to covid there is a very long wait time to be seen.  I have sent a high priority note to [facility] to see if they can offer him anything as far as an appt.    Pt was agreeable to this plan.  He can be reached at [phone].    Reason for Disposition . Earache  (Exceptions: brief ear pain of < 60 minutes duration, earache occurring during air travel  Answer Assessment - Initial Assessment Questions 1. LOCATION: "Which ear is involved?"     A wk ago I've had a head cold.  I'm blowing my nose and coughing real bad.   Last wk I saw Marcelino Duster last wk.   She prescribed me some medicine for a sinus infection.   My left ear is hurting now.  No fever, no diarrhea, vomiting or body aches or dizziness.    2. ONSET: "When did the ear start hurting"      Right near the eardrum it's hurting.   My hearing is fine it just hurts.   I can't bite on the left side because it hurts. 3. SEVERITY: "How bad is the pain?"  (Scale 1-10; mild, moderate or severe)   - MILD (1-3): doesn't interfere with normal activities    - MODERATE (4-7): interferes with normal activities or awakens from sleep    - SEVERE (8-10): excruciating pain, unable to do any normal activities       Very painful.    I could not sleep last night. 4. URI SYMPTOMS: "Do you have a runny nose or cough?"     I have a runny nose.   I'm using Flonase which is helping that I got last wk from [place].    I'm coughing up clear mucus.   If I feel worse I'm to be tested for covid.   I've had both covid vaccines.   I'm for the booster in Feb. 5. FEVER: "Do you have a fever?" If Yes, ask: "What is your temperature, how was it measured, and when did it start?"     No 6. CAUSE: "Have you been swimming recently?", "How often do you use Q-TIPS?", "Have you had any recent air travel or scuba diving?"     I can't eat because my ear hurts to bite down. 7. OTHER SYMPTOMS: "Do you have any other symptoms?" (e.g., headache, stiff neck, dizziness, vomiting, runny nose, decreased hearing)     See above 8. PREGNANCY: "Is there any chance you are pregnant?" "When was your last menstrual period?"     N/A  Protocols  used: EARACHE-A-AH

## 2020-11-15 ENCOUNTER — Other Ambulatory Visit (INDEPENDENT_AMBULATORY_CARE_PROVIDER_SITE_OTHER): Payer: Self-pay | Admitting: Primary Care

## 2020-11-15 NOTE — Progress Notes (Signed)
Called patient inform to buy antihistamine with pseudoephedrine take for 5-7 days

## 2020-11-15 NOTE — Telephone Encounter (Signed)
Please advise 

## 2020-11-18 NOTE — Progress Notes (Signed)
Telephone Note  I connected with Richard Lopez on 11/18/20 at  2:30 PM EST by telephone and verified that I am speaking with the correct person using two identifiers.  Location: Patient: home Provider: Grayce Lopez working from home   I discussed the limitations, risks, security and privacy concerns of performing an evaluation and management service by telephone and the availability of in person appointments. I also discussed with the patient that there may be a patient responsible charge related to this service. The patient expressed understanding and agreed to proceed.   History of Present Illness:  [name] is a 45 year old male for a follow up from the hospital. Admit date to the hospital was 10/22/20, patient was discharged from the hospital on 10/24/20, patient was admitted EPP:IRJJO kidney injury secondary to dehydration. He has complaints of cold like symptoms nasal congestion, denies fever, chills body aches . He is adamant that he has not been around anyone dx with COVID and does not go anywhere. He is also, concern that he is experiencing pain in his hands and hips. No trauma or falls.   Past Medical History:  Diagnosis Date  . Anxiety   . 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 Depression   . Diabetes mellitus    diet controlled  . Essential hypertension   . Gastritis   . Gout   . Normocytic anemia due to blood loss 08/21/2019  . Pulmonary embolism (HCC)      No Known Allergies    Current Outpatient Medications on File Prior to Visit  Medication Sig Dispense Refill  . acetaminophen (TYLENOL) 325 MG tablet Take 650 mg by mouth every 6 (six) hours as needed for mild pain, fever or headache.    . diclofenac Sodium (VOLTAREN) 1 % GEL Apply 2 g topically in the morning, at noon, and at bedtime. Apply to both knees. (Patient taking differently: Apply 2 g topically 3 (three) times daily as needed (pain). Apply to both knees.) 350 g 0  . Metoprolol Tartrate 75  MG TABS Take 75 mg by mouth 2 (two) times daily. 60 tablet 2  . omeprazole (PRILOSEC) 20 MG capsule Take 20 mg by mouth daily.     No current facility-administered medications on file prior to visit.    ROS: all negative except above.  [name] was seen today for hospitalization follow-up.  Diagnoses and all orders for this visit:  Hospital discharge follow-up Retrieved from d/c A1C 5.4 well control diabetes / kidney function improved monitor   Follow-Ups: Schedule an appointment with Richard Sessions, NP (Internal Medicine) in 1 week (10/31/2020)    Adjustment disorder with mixed anxiety and depressed mood Flowsheet Row Office Visit from 05/09/2020 in [facility]  PHQ-9 Total Score 11    f/u with CSW  Allergic rhinitis, unspecified seasonality, unspecified trigger Treat for allergies encourage to be screen for COVID -     loratadine (CLARITIN) 10 MG tablet; Take 1 tablet (10 mg total) by mouth daily. -     fluticasone (FLONASE) 50 MCG/ACT nasal spray; Place 2 sprays into both nostrils daily.   Richard Sessions, NP 10:25 PM    Follow Up Instructions:    I discussed the assessment and treatment plan with the patient. The patient was provided an opportunity to ask questions and all were answered. The patient agreed with the plan and demonstrated an understanding of the instructions.   The patient was  advised to call back or seek an in-person evaluation if the symptoms worsen or if the condition fails to improve as anticipated.  I provided 20 minutes of non-face-to-face time during this encounter. Review labs, encounters,    Richard Sessions, NP

## 2020-11-23 ENCOUNTER — Telehealth (INDEPENDENT_AMBULATORY_CARE_PROVIDER_SITE_OTHER): Payer: Self-pay | Admitting: Primary Care

## 2020-11-23 ENCOUNTER — Other Ambulatory Visit (INDEPENDENT_AMBULATORY_CARE_PROVIDER_SITE_OTHER): Payer: Self-pay | Admitting: Primary Care

## 2020-11-23 DIAGNOSIS — J069 Acute upper respiratory infection, unspecified: Secondary | ICD-10-CM

## 2020-11-23 MED ORDER — AZITHROMYCIN 250 MG PO TABS: ORAL_TABLET | ORAL | 0 refills | Status: DC

## 2020-11-23 NOTE — Telephone Encounter (Signed)
Sent in abt's for URI/

## 2020-11-23 NOTE — Telephone Encounter (Signed)
Pt called and stated he has noticed white bumps on the back of his tongue and he is worried / Pt also mentioned Marcelino Duster mentioned something about sending him an antibiotic/ He stated the medication he was prescribed and the Decompressant has help greatly and he feels so much better but got a bit worried when he noticed the bumps on tongue / pt asked if Marcelino Duster or the nurse  can give him a call / please advise

## 2020-11-23 NOTE — Telephone Encounter (Signed)
Sent to PCP to advise 

## 2020-11-26 MED FILL — AZITHROMYCIN 250 MG TABLET: 250 | 5 days supply | Qty: 6 | Fill #0

## 2020-11-26 NOTE — Telephone Encounter (Signed)
Patient is aware. Will pick up on Thursday or Friday.

## 2021-02-01 ENCOUNTER — Emergency Department (HOSPITAL_COMMUNITY)
Admission: EM | Admit: 2021-02-01 | Discharge: 2021-02-01 | Disposition: A | Discharge status: Home / Self Care | Payer: Self-pay | Attending: Emergency Medicine | Admitting: Emergency Medicine

## 2021-02-01 ENCOUNTER — Other Ambulatory Visit: Payer: Self-pay

## 2021-02-01 DIAGNOSIS — I10 Essential (primary) hypertension: Secondary | ICD-10-CM | POA: Insufficient documentation

## 2021-02-01 DIAGNOSIS — Z87891 Personal history of nicotine dependence: Secondary | ICD-10-CM | POA: Insufficient documentation

## 2021-02-01 DIAGNOSIS — E860 Dehydration: Secondary | ICD-10-CM

## 2021-02-01 DIAGNOSIS — E119 Type 2 diabetes mellitus without complications: Secondary | ICD-10-CM | POA: Insufficient documentation

## 2021-02-01 DIAGNOSIS — Z79899 Other long term (current) drug therapy: Secondary | ICD-10-CM | POA: Insufficient documentation

## 2021-02-01 DIAGNOSIS — E876 Hypokalemia: Secondary | ICD-10-CM

## 2021-02-01 LAB — COMPREHENSIVE METABOLIC PANEL
ALT: 37 U/L (ref 0–44)
AST: 77 U/L — ABNORMAL HIGH (ref 15–41)
Albumin: 4.2 g/dL (ref 3.5–5.0)
Alkaline Phosphatase: 77 U/L (ref 38–126)
Anion gap: 15 (ref 5–15)
BUN: 23 mg/dL — ABNORMAL HIGH (ref 6–20)
CO2: 23 mmol/L (ref 22–32)
Calcium: 8.5 mg/dL — ABNORMAL LOW (ref 8.9–10.3)
Chloride: 91 mmol/L — ABNORMAL LOW (ref 98–111)
Creatinine, Ser: 1.58 mg/dL — ABNORMAL HIGH (ref 0.61–1.24)
GFR, Estimated: 55 mL/min — ABNORMAL LOW (ref >60)
Glucose, Bld: 189 mg/dL — ABNORMAL HIGH (ref 70–99)
Potassium: 2.7 mmol/L — CL (ref 3.5–5.1)
Sodium: 129 mmol/L — ABNORMAL LOW (ref 135–145)
Total Bilirubin: 1.4 mg/dL — ABNORMAL HIGH (ref 0.3–1.2)
Total Protein: 8.8 g/dL — ABNORMAL HIGH (ref 6.5–8.1)

## 2021-02-01 LAB — POTASSIUM: Potassium: 4.1 mmol/L (ref 3.5–5.1)

## 2021-02-01 LAB — CBC WITH DIFFERENTIAL/PLATELET
Abs Immature Granulocytes: 0.01 10*3/uL (ref 0.00–0.07)
Basophils Absolute: 0 10*3/uL (ref 0.0–0.1)
Basophils Relative: 0 %
Eosinophils Absolute: 0 10*3/uL (ref 0.0–0.5)
Eosinophils Relative: 0 %
HCT: 37.2 % — ABNORMAL LOW (ref 39.0–52.0)
Hemoglobin: 12.8 g/dL — ABNORMAL LOW (ref 13.0–17.0)
Immature Granulocytes: 0 %
Lymphocytes Relative: 25 %
Lymphs Abs: 1.3 10*3/uL (ref 0.7–4.0)
MCH: 31 pg (ref 26.0–34.0)
MCHC: 34.4 g/dL (ref 30.0–36.0)
MCV: 90.1 fL (ref 80.0–100.0)
Monocytes Absolute: 0.8 10*3/uL (ref 0.1–1.0)
Monocytes Relative: 16 %
Neutro Abs: 2.9 10*3/uL (ref 1.7–7.7)
Neutrophils Relative %: 59 %
Platelets: 165 10*3/uL (ref 150–400)
RBC: 4.13 MIL/uL — ABNORMAL LOW (ref 4.22–5.81)
RDW: 14.4 % (ref 11.5–15.5)
WBC: 5 10*3/uL (ref 4.0–10.5)
nRBC: 0 % (ref 0.0–0.2)

## 2021-02-01 MED ORDER — LACTATED RINGERS IV SOLN: INTRAVENOUS | Status: DC

## 2021-02-01 MED ORDER — LACTATED RINGERS IV BOLUS
2000.0000 mL | Freq: Once | INTRAVENOUS | Status: AC
  Administered 2021-02-01: 2000 mL via INTRAVENOUS

## 2021-02-01 MED ORDER — POTASSIUM CHLORIDE CRYS ER 20 MEQ PO TBCR
60.0000 meq | EXTENDED_RELEASE_TABLET | Freq: Once | ORAL | Status: AC
  Administered 2021-02-01: 60 meq via ORAL
  Filled 2021-02-01: qty 3

## 2021-02-01 NOTE — ED Provider Notes (Signed)
[facility]-EMERGENCY DEPT Provider Note   CSN: [id number] Arrival date & time: 02/01/21  1325     History Chief Complaint  Patient presents with  . Near Syncope    Richard Lopez is a 45 y.o. male.  45 year old male presents after having near syncopal event just prior to arrival.  Patient states has not been eating or drinking very much the last couple days and was standing for a long period time and felt dizzy and lightheaded.  Denied actually passing out.  No associated dyspnea or chest pain.  No recent history of volume loss.  EMS called and patient transferred here for management        Past Medical History:  Diagnosis Date  . Anxiety   . 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 Depression   . Diabetes mellitus    diet controlled  . Essential hypertension   . Gastritis   . Gout   . Normocytic anemia due to blood loss 08/21/2019  . Pulmonary embolism [facility])     Patient Active Problem List   Diagnosis Date Noted  . AF (paroxysmal atrial fibrillation) (HCC)   . Transaminitis   . Dehydration   . AKI (acute kidney injury) (HCC) 06/18/2020  . Atrial fibrillation with RVR (HCC) 04/22/2020  . 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  . Hyponatremia 04/21/2020  . Aspiration pneumonia (HCC) 04/21/2020  . Esophagitis determined by endoscopy 08/21/2019  . Anemia 08/21/2019  . Hypokalemia 08/17/2019  . Hypomagnesemia 08/17/2019  . Hemoptysis 08/17/2019  . Acute upper GI bleed 08/14/2019  . Hematemesis 08/13/2019  . Cavitary lesion of lung 08/01/2019  . Gout 05/18/2019  . Peri-prosthetic fracture of femur at tip of prosthesis 05/16/2019  . Alcohol use 05/16/2019  . Intertrochanteric fracture of right hip (HCC) 04/14/2019  . Vitamin D deficiency 04/14/2019  . DTs (delirium tremens) (HCC) 04/07/2019  . Delirium tremens (HCC) 04/07/2019  . Encephalopathy acute   . Closed right hip fracture, initial encounter (HCC) 03/25/2019  . Alcohol dependence with  intoxication (HCC) 03/25/2019  . Hypertensive urgency 03/25/2019  . Thrombocytopenia (HCC) 03/25/2019  . Effusion, right knee 03/25/2019  . Closed fracture of right femur (HCC)   . High anion gap metabolic acidosis   . Prediabetes 04/02/2017  . Gastritis 04/02/2017  . Marijuana abuse 04/01/2017  . Nausea with vomiting 07/29/2014  . Type 2 diabetes mellitus with hyperlipidemia (HCC) 07/29/2014  . Essential hypertension 07/29/2014  . Nausea & vomiting 07/29/2014    Past Surgical History:  Procedure Laterality Date  . ESOPHAGOGASTRODUODENOSCOPY (EGD) WITH PROPOFOL N/A 08/14/2019   Procedure: ESOPHAGOGASTRODUODENOSCOPY (EGD) WITH PROPOFOL;  Surgeon: Charlott Rakes, MD;  Location: [facility] ENDOSCOPY;  Service: Endoscopy;  Laterality: N/A;  . EXTERNAL FIXATION LEG Left 11/07/2018   Procedure: EXTERNAL FIXATION LEFT LOWER LEG;  Surgeon: Samson Frederic, MD;  Location: [facility] ORS;  Service: Orthopedics;  Laterality: Left;  . EXTERNAL FIXATION REMOVAL Left 11/08/2018   Procedure: REMOVAL EXTERNAL FIXATION LEG;  Surgeon: Roby Lofts, MD;  Location: [facility] OR;  Service: Orthopedics;  Laterality: Left;  . INTRAMEDULLARY (IM) NAIL INTERTROCHANTERIC Right 03/26/2019   Procedure: INTRAMEDULLARY (IM) NAIL INTERTROCHANTRIC;  Surgeon: Roby Lofts, MD;  Location: [facility] OR;  Service: Orthopedics;  Laterality: Right;  . INTRAMEDULLARY (IM) NAIL INTERTROCHANTERIC Right 05/17/2019   Procedure: Intramedullary (Im) Nail Intertroch with circlage wiring;  Surgeon: Myrene Galas, MD;  Location: [facility] OR;  Service: Orthopedics;  Laterality: Right;  . NO PAST SURGERIES    .  OPEN REDUCTION INTERNAL FIXATION (ORIF) TIBIA/FIBULA FRACTURE Left 11/08/2018   Procedure: OPEN REDUCTION INTERNAL FIXATION (ORIF) TIBIA/FIBULA FRACTURE;  Surgeon: Roby Lofts, MD;  Location: [facility] OR;  Service: Orthopedics;  Laterality: Left;  . ORIF FEMUR FRACTURE Right 05/17/2019   Procedure: REMOVAL  OF HARDWARE;  Surgeon: Myrene Galas, MD;  Location: [facility]  OR;  Service: Orthopedics;  Laterality: Right;       Family History  Problem Relation Age of Onset  . Diabetes Mellitus II Father   . Diabetes Mellitus II Other   . CAD Other     Social History   Tobacco Use  . Smoking status: Former Smoker    Packs/day: 0.00    Types: Cigarettes  . Smokeless tobacco: Never Used  . Tobacco comment: About 1 cigarette per day or less  Vaping Use  . Vaping Use: Never used  Substance Use Topics  . Alcohol use: Yes    Alcohol/week: 200.0 standard drinks    Types: 200 Cans of beer per week  . Drug use: Yes    Types: Marijuana    Home Medications Prior to Admission medications   Medication Sig Start Date End Date Taking? Authorizing Provider  acetaminophen (TYLENOL) 325 MG tablet Take 650 mg by mouth every 6 (six) hours as needed for mild pain, fever or headache.    [provider]  azithromycin (ZITHROMAX Z-PAK) 250 MG tablet Take 2 tablets day 1, than 1 tablet until gone 11/23/20   [name], NP  diclofenac Sodium (VOLTAREN) 1 % GEL Apply 2 g topically in the morning, at noon, and at bedtime. Apply to both knees. Patient taking differently: Apply 2 g topically 3 (three) times daily as needed (pain). Apply to both knees. 04/23/20   Arrien, York Ram, MD  fluticasone (FLONASE) 50 MCG/ACT nasal spray Place 2 sprays into both nostrils daily. 11/07/20   [name], NP  loratadine (CLARITIN) 10 MG tablet Take 1 tablet (10 mg total) by mouth daily. 11/07/20   [name], NP  Metoprolol Tartrate 75 MG TABS Take 75 mg by mouth 2 (two) times daily. 10/24/20 01/22/21  Noralee Stain, DO  omeprazole (PRILOSEC) 20 MG capsule Take 20 mg by mouth daily.    [provider]    Allergies    Patient has no known allergies.  Review of Systems   Review of Systems  All other systems reviewed and are negative.   Physical Exam Updated Vital Signs BP (!) 148/94 (BP Location: Left Arm)   Pulse (!) 108   Temp 98.9  F (37.2 C) (Oral)   Resp 20   Ht 1.981 m (6\' 6" )   Wt 104.3 kg   SpO2 100%   BMI 26.58 kg/m   Physical Exam Vitals and nursing note reviewed.  Constitutional:      General: He is not in acute distress.    Appearance: Normal appearance. He is well-developed. He is not toxic-appearing.  HENT:     Head: Normocephalic and atraumatic.  Eyes:     General: Lids are normal.     Conjunctiva/sclera: Conjunctivae normal.     Pupils: Pupils are equal, round, and reactive to light.  Neck:     Thyroid: No thyroid mass.     Trachea: No tracheal deviation.  Cardiovascular:     Rate and Rhythm: Normal rate and regular rhythm.     Heart sounds: Normal heart sounds. No murmur heard. No gallop.   Pulmonary:     Effort:  Pulmonary effort is normal. No respiratory distress.     Breath sounds: Normal breath sounds. No stridor. No decreased breath sounds, wheezing, rhonchi or rales.  Abdominal:     General: Bowel sounds are normal. There is no distension.     Palpations: Abdomen is soft.     Tenderness: There is no abdominal tenderness. There is no rebound.  Musculoskeletal:        General: No tenderness. Normal range of motion.     Cervical back: Normal range of motion and neck supple.  Skin:    General: Skin is warm and dry.     Findings: No abrasion or rash.  Neurological:     General: No focal deficit present.     Mental Status: He is alert and oriented to person, place, and time.     GCS: GCS eye subscore is 4. GCS verbal subscore is 5. GCS motor subscore is 6.     Cranial Nerves: Cranial nerves are intact. No cranial nerve deficit.     Sensory: No sensory deficit.     Motor: Motor function is intact.  Psychiatric:        Speech: Speech normal.        Behavior: Behavior normal.     ED Results / Procedures / Treatments   Labs (all labs ordered are listed, but only abnormal results are displayed) Labs Reviewed  CBC WITH DIFFERENTIAL/PLATELET  COMPREHENSIVE METABOLIC PANEL     EKG None  Radiology No results found.  Procedures Procedures   Medications Ordered in ED Medications  lactated ringers bolus 2,000 mL (has no administration in time range)  lactated ringers infusion (has no administration in time range)    ED Course  I have reviewed the triage vital signs and the nursing notes.  Pertinent labs & imaging results that were available during my care of the patient were reviewed by me and considered in my medical decision making (see chart for details).    MDM Rules/Calculators/A&P                          Patient given IV fluids here for a slight AKI likely from dehydration.  Patient was hypokalemic with a K of 2.7.  Given 60 mEq of oral potassium repeat potassium 4.1.  Patient feels better and is stable for discharge  CRITICAL CARE Performed by: Toy Baker Total critical care time: 50 minutes Critical care time was exclusive of separately billable procedures and treating other patients. Critical care was necessary to treat or prevent imminent or life-threatening deterioration. Critical care was time spent personally by me on the following activities: development of treatment plan with patient and/or surrogate as well as nursing, discussions with consultants, evaluation of patient's response to treatment, examination of patient, obtaining history from patient or surrogate, ordering and performing treatments and interventions, ordering and review of laboratory studies, ordering and review of radiographic studies, pulse oximetry and re-evaluation of patient's condition.  Final Clinical Impression(s) / ED Diagnoses Final diagnoses:  None    Rx / DC Orders ED Discharge Orders    None       Lorre Nick, MD 02/01/21 2023

## 2021-02-01 NOTE — ED Triage Notes (Signed)
Ems [place] pt in from [place]. States pt was standing for about 30 to 45 minutes in line and felt legs becoming weak. Pt was able to lean up against a car and started feeling better. Pt states he just doesn't feel well.

## 2021-02-03 ENCOUNTER — Other Ambulatory Visit: Payer: Self-pay

## 2021-02-03 ENCOUNTER — Encounter (HOSPITAL_COMMUNITY): Payer: Self-pay

## 2021-02-03 ENCOUNTER — Emergency Department (HOSPITAL_COMMUNITY)
Admission: EM | Admit: 2021-02-03 | Discharge: 2021-02-03 | Disposition: A | Discharge status: Home / Self Care | Payer: Self-pay | Attending: Emergency Medicine | Admitting: Emergency Medicine

## 2021-02-03 DIAGNOSIS — E119 Type 2 diabetes mellitus without complications: Secondary | ICD-10-CM | POA: Insufficient documentation

## 2021-02-03 DIAGNOSIS — E876 Hypokalemia: Secondary | ICD-10-CM | POA: Insufficient documentation

## 2021-02-03 DIAGNOSIS — Z87891 Personal history of nicotine dependence: Secondary | ICD-10-CM | POA: Insufficient documentation

## 2021-02-03 DIAGNOSIS — R1013 Epigastric pain: Secondary | ICD-10-CM | POA: Insufficient documentation

## 2021-02-03 DIAGNOSIS — K920 Hematemesis: Secondary | ICD-10-CM | POA: Insufficient documentation

## 2021-02-03 DIAGNOSIS — Z79899 Other long term (current) drug therapy: Secondary | ICD-10-CM | POA: Insufficient documentation

## 2021-02-03 DIAGNOSIS — I10 Essential (primary) hypertension: Secondary | ICD-10-CM | POA: Insufficient documentation

## 2021-02-03 LAB — COMPREHENSIVE METABOLIC PANEL
ALT: 38 U/L (ref 0–44)
AST: 62 U/L — ABNORMAL HIGH (ref 15–41)
Albumin: 4.4 g/dL (ref 3.5–5.0)
Alkaline Phosphatase: 79 U/L (ref 38–126)
Anion gap: 17 — ABNORMAL HIGH (ref 5–15)
BUN: 26 mg/dL — ABNORMAL HIGH (ref 6–20)
CO2: 24 mmol/L (ref 22–32)
Calcium: 9.3 mg/dL (ref 8.9–10.3)
Chloride: 89 mmol/L — ABNORMAL LOW (ref 98–111)
Creatinine, Ser: 1.83 mg/dL — ABNORMAL HIGH (ref 0.61–1.24)
GFR, Estimated: 46 mL/min — ABNORMAL LOW (ref >60)
Glucose, Bld: 177 mg/dL — ABNORMAL HIGH (ref 70–99)
Potassium: 3.4 mmol/L — ABNORMAL LOW (ref 3.5–5.1)
Sodium: 130 mmol/L — ABNORMAL LOW (ref 135–145)
Total Bilirubin: 1.3 mg/dL — ABNORMAL HIGH (ref 0.3–1.2)
Total Protein: 9.7 g/dL — ABNORMAL HIGH (ref 6.5–8.1)

## 2021-02-03 LAB — CBC
HCT: 37.3 % — ABNORMAL LOW (ref 39.0–52.0)
Hemoglobin: 12.9 g/dL — ABNORMAL LOW (ref 13.0–17.0)
MCH: 31.1 pg (ref 26.0–34.0)
MCHC: 34.6 g/dL (ref 30.0–36.0)
MCV: 89.9 fL (ref 80.0–100.0)
Platelets: 244 10*3/uL (ref 150–400)
RBC: 4.15 MIL/uL — ABNORMAL LOW (ref 4.22–5.81)
RDW: 14 % (ref 11.5–15.5)
WBC: 6 10*3/uL (ref 4.0–10.5)
nRBC: 0 % (ref 0.0–0.2)

## 2021-02-03 LAB — LIPASE, BLOOD: Lipase: 38 U/L (ref 11–51)

## 2021-02-03 LAB — POC OCCULT BLOOD, ED: Fecal Occult Bld: NEGATIVE

## 2021-02-03 LAB — ETHANOL: Alcohol, Ethyl (B): <10 mg/dL (ref <10)

## 2021-02-03 LAB — MAGNESIUM: Magnesium: 1.4 mg/dL — ABNORMAL LOW (ref 1.7–2.4)

## 2021-02-03 MED ORDER — SODIUM CHLORIDE 0.9 % IV BOLUS
1000.0000 mL | Freq: Once | INTRAVENOUS | Status: AC
  Administered 2021-02-03: 1000 mL via INTRAVENOUS

## 2021-02-03 MED ORDER — PANTOPRAZOLE SODIUM 20 MG PO TBEC: 20.0000 mg | DELAYED_RELEASE_TABLET | Freq: Every day | ORAL | 0 refills | Status: DC

## 2021-02-03 MED ORDER — MAGNESIUM SULFATE 2 GM/50ML IV SOLN
2.0000 g | Freq: Once | INTRAVENOUS | Status: AC
  Administered 2021-02-03: 2 g via INTRAVENOUS
  Filled 2021-02-03: qty 50

## 2021-02-03 MED ORDER — PANTOPRAZOLE SODIUM 40 MG IV SOLR
40.0000 mg | Freq: Once | INTRAVENOUS | Status: AC
  Administered 2021-02-03: 40 mg via INTRAVENOUS
  Filled 2021-02-03: qty 40

## 2021-02-03 MED ORDER — ONDANSETRON HCL 4 MG/2ML IJ SOLN: 4.0000 mg | Freq: Once | INTRAMUSCULAR | Status: DC

## 2021-02-03 MED ORDER — POTASSIUM CHLORIDE CRYS ER 20 MEQ PO TBCR
40.0000 meq | EXTENDED_RELEASE_TABLET | Freq: Once | ORAL | Status: AC
  Administered 2021-02-03: 40 meq via ORAL
  Filled 2021-02-03: qty 2

## 2021-02-03 MED ORDER — ONDANSETRON 4 MG PO TBDP
4.0000 mg | ORAL_TABLET | Freq: Once | ORAL | Status: AC | PRN
  Administered 2021-02-03: 4 mg via ORAL
  Filled 2021-02-03: qty 1

## 2021-02-03 NOTE — ED Triage Notes (Signed)
Pt reports abdominal pain and vomiting x2 days. Pt states he started vomiting blood last night. Pt was seen here 4/1 for LOC.

## 2021-02-03 NOTE — ED Provider Notes (Signed)
Patient placed in Quick Look pathway, seen and evaluated   Chief Complaint:  nv, hematemesis (dark red) that started last night  HPI:   45 y/o m states he started have nv and hematemesis last night. Started out has normal vomiting but later had red blood in it. Denies coffee ground emesis. Has some epigastric abd pain as well. Further reports muscle cramping  ROS: abd pain, nv, hematemesis, cramping (one)  Physical Exam:   Gen: No distress  Neuro: Awake and Alert  Skin: Warm    Focused Exam: mild epigastric ttp   Initiation of care has begun. The patient has been counseled on the process, plan, and necessity for staying for the completion/evaluation, and the remainder of the medical screening examination  MSE was initiated and I personally evaluated the patient and placed orders (if any) at  1:52 PM on February 03, 2021.  The patient appears stable so that the remainder of the MSE may be completed by another provider.    Karrie Meres, PA-C 02/03/21 1352    Terald Sleeper, MD 02/03/21 [phone]

## 2021-02-03 NOTE — ED Provider Notes (Signed)
[facility]-EMERGENCY DEPT Provider Note   CSN: [id number] Arrival date & time: 02/03/21  1314     History Chief Complaint  Patient presents with  . Emesis  . Abdominal Pain    Richard Lopez is a 45 y.o. male.  Patient is a 45 year old male with a history of hypertension, diet-controlled diabetes, alcohol abuse, paroxysmal atrial fibrillation, not on anticoagulants and prior PE who presents with vomiting.  He states that he quit drinking about a week ago although he did have 1 beer yesterday.  He said it was a tall beer.  He said he woke up during the night around 2 AM and started vomiting.  Initially it was stomach contents.  He then had a few episodes of vomiting where it was dark blood.  He said it was pretty much all blood without any associated stomach contents.  He said he his last emesis was 12:00 PM today around lunchtime and it was something that he drank without any evidence of blood in it.  He has some pain in his epigastrium but otherwise denies abdominal pain.  He feels a little cramping in his muscles.  He was recently seen here 2 days ago for dehydration, muscle cramps and hypokalemia.  He was treated and released at that time.  He says that he is not currently taking any medications for his hypertension.  Is not clear whether he stopped taking on his own or his physician stopped prescribing it.  He denies any blood in his stools or black stools.        Past Medical History:  Diagnosis Date  . Anxiety   . 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 Depression   . Diabetes mellitus    diet controlled  . Essential hypertension   . Gastritis   . Gout   . Normocytic anemia due to blood loss 08/21/2019  . Pulmonary embolism [facility])     Patient Active Problem List   Diagnosis Date Noted  . AF (paroxysmal atrial fibrillation) (HCC)   . Transaminitis   . Dehydration   . AKI (acute kidney injury) (HCC) 06/18/2020  . Atrial fibrillation with RVR (HCC)  04/22/2020  . 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  . Hyponatremia 04/21/2020  . Aspiration pneumonia (HCC) 04/21/2020  . Esophagitis determined by endoscopy 08/21/2019  . Anemia 08/21/2019  . Hypokalemia 08/17/2019  . Hypomagnesemia 08/17/2019  . Hemoptysis 08/17/2019  . Acute upper GI bleed 08/14/2019  . Hematemesis 08/13/2019  . Cavitary lesion of lung 08/01/2019  . Gout 05/18/2019  . Peri-prosthetic fracture of femur at tip of prosthesis 05/16/2019  . Alcohol use 05/16/2019  . Intertrochanteric fracture of right hip (HCC) 04/14/2019  . Vitamin D deficiency 04/14/2019  . DTs (delirium tremens) (HCC) 04/07/2019  . Delirium tremens (HCC) 04/07/2019  . Encephalopathy acute   . Closed right hip fracture, initial encounter (HCC) 03/25/2019  . Alcohol dependence with intoxication (HCC) 03/25/2019  . Hypertensive urgency 03/25/2019  . Thrombocytopenia (HCC) 03/25/2019  . Effusion, right knee 03/25/2019  . Closed fracture of right femur (HCC)   . High anion gap metabolic acidosis   . Prediabetes 04/02/2017  . Gastritis 04/02/2017  . Marijuana abuse 04/01/2017  . Nausea with vomiting 07/29/2014  . Type 2 diabetes mellitus with hyperlipidemia (HCC) 07/29/2014  . Essential hypertension 07/29/2014  . Nausea & vomiting 07/29/2014    Past Surgical History:  Procedure Laterality Date  . ESOPHAGOGASTRODUODENOSCOPY (EGD) WITH PROPOFOL N/A 08/14/2019  Procedure: ESOPHAGOGASTRODUODENOSCOPY (EGD) WITH PROPOFOL;  Surgeon: Charlott Rakes, MD;  Location: [facility];  Service: Endoscopy;  Laterality: N/A;  . EXTERNAL FIXATION LEG Left 11/07/2018   Procedure: EXTERNAL FIXATION LEFT LOWER LEG;  Surgeon: Samson Frederic, MD;  Location: [facility] OR[facility];  Service: Orthopedics;  Laterality: Left;  . EXTERNAL FIXATION REMOVAL Left 11/08/2018   Procedure: REMOVAL EXTERNAL FIXATION LEG;  Surgeon: Roby Lofts, MD;  Location: [facility] OR;  Service: Orthopedics;  Laterality: Left;  . INTRAMEDULLARY (IM) NAIL  INTERTROCHANTERIC Right 03/26/2019   Procedure: INTRAMEDULLARY (IM) NAIL INTERTROCHANTRIC;  Surgeon: Roby Lofts, MD;  Location: [facility] OR;  Service: Orthopedics;  Laterality: Right;  . INTRAMEDULLARY (IM) NAIL INTERTROCHANTERIC Right 05/17/2019   Procedure: Intramedullary (Im) Nail Intertroch with circlage wiring;  Surgeon: Myrene Galas, MD;  Location: [facility] OR;  Service: Orthopedics;  Laterality: Right;  . NO PAST SURGERIES    . OPEN REDUCTION INTERNAL FIXATION (ORIF) TIBIA/FIBULA FRACTURE Left 11/08/2018   Procedure: OPEN REDUCTION INTERNAL FIXATION (ORIF) TIBIA/FIBULA FRACTURE;  Surgeon: Roby Lofts, MD;  Location: [facility];  Service: Orthopedics;  Laterality: Left;  . ORIF FEMUR FRACTURE Right 05/17/2019   Procedure: REMOVAL  OF HARDWARE;  Surgeon: Myrene Galas, MD;  Location: [facility] OR;  Service: Orthopedics;  Laterality: Right;       Family History  Problem Relation Age of Onset  . Diabetes Mellitus II Father   . Diabetes Mellitus II Other   . CAD Other     Social History   Tobacco Use  . Smoking status: Former Smoker    Packs/day: 0.00    Types: Cigarettes  . Smokeless tobacco: Never Used  . Tobacco comment: About 1 cigarette per day or less  Vaping Use  . Vaping Use: Never used  Substance Use Topics  . Alcohol use: Yes    Alcohol/week: 200.0 standard drinks    Types: 200 Cans of beer per week  . Drug use: Yes    Types: Marijuana    Home Medications Prior to Admission medications   Medication Sig Start Date End Date Taking? Authorizing Provider  pantoprazole (PROTONIX) 20 MG tablet Take 1 tablet (20 mg total) by mouth daily. 02/03/21  Yes Rolan Bucco, MD  azithromycin (ZITHROMAX) 250 MG tablet TAKE 2 TABLETS DAY 1, THAN 1 TABLET UNTIL GONE Patient not taking: Reported on 02/01/2021 11/23/20 11/23/21  [name], NP  fluticasone (FLONASE) 50 MCG/ACT nasal spray PLACE 2 SPRAYS INTO BOTH NOSTRILS DAILY. Patient not taking: No sig reported 11/07/20 11/07/21  [name], NP  loratadine (CLARITIN) 10 MG tablet TAKE 1 TABLET (10 MG TOTAL) BY MOUTH DAILY. Patient not taking: No sig reported 11/07/20 11/07/21  [name], NP  MAGNESIUM PO Take 1 tablet by mouth daily.    [provider]  Metoprolol Tartrate 75 MG TABS Take 75 mg by mouth 2 (two) times daily. Patient not taking: Reported on 02/01/2021 10/24/20 01/22/21  Noralee Stain, DO  Multiple Vitamin (MULTIVITAMIN WITH MINERALS) TABS tablet Take 1 tablet by mouth daily.    [provider]  omeprazole (PRILOSEC) 20 MG capsule Take 20 mg by mouth daily.    [provider]  POTASSIUM PO Take 1 tablet by mouth daily.    [provider]  VITAMIN D PO Take 1 capsule by mouth daily.    [provider]    Allergies    Patient has no known allergies.  Review of Systems   Review of Systems  Constitutional: Negative for  chills, diaphoresis, fatigue and fever.  HENT: Negative for congestion, rhinorrhea and sneezing.   Eyes: Negative.   Respiratory: Negative for cough, chest tightness and shortness of breath.   Cardiovascular: Negative for chest pain and leg swelling.  Gastrointestinal: Positive for abdominal pain, nausea and vomiting. Negative for blood in stool and diarrhea.       Hematemesis  Genitourinary: Negative for difficulty urinating, flank pain, frequency and hematuria.  Musculoskeletal: Negative for arthralgias and back pain.  Skin: Negative for rash.  Neurological: Negative for dizziness, speech difficulty, weakness, numbness and headaches.    Physical Exam Updated Vital Signs BP (!) 141/89   Pulse 75   Temp 98.4 F (36.9 C) (Oral)   Resp 14   SpO2 100%   Physical Exam Constitutional:      Appearance: He is well-developed.  HENT:     Head: Normocephalic and atraumatic.  Eyes:     Pupils: Pupils are equal, round, and reactive to light.  Cardiovascular:     Rate and Rhythm: Normal rate and regular rhythm.     Heart sounds:  Normal heart sounds.  Pulmonary:     Effort: Pulmonary effort is normal. No respiratory distress.     Breath sounds: Normal breath sounds. No wheezing or rales.  Chest:     Chest wall: No tenderness.  Abdominal:     General: Bowel sounds are normal.     Palpations: Abdomen is soft.     Tenderness: There is abdominal tenderness in the epigastric area. There is no guarding or rebound.     Comments: Mild tenderness to the epigastrium  Genitourinary:    Comments: Rectal exam shows brown stool Musculoskeletal:        General: Normal range of motion.     Cervical back: Normal range of motion and neck supple.  Lymphadenopathy:     Cervical: No cervical adenopathy.  Skin:    General: Skin is warm and dry.     Findings: No rash.  Neurological:     Mental Status: He is alert and oriented to person, place, and time.     ED Results / Procedures / Treatments   Labs (all labs ordered are listed, but only abnormal results are displayed) Labs Reviewed  COMPREHENSIVE METABOLIC PANEL - Abnormal; Notable for the following components:      Result Value   Sodium 130 (*)    Potassium 3.4 (*)    Chloride 89 (*)    Glucose, Bld 177 (*)    BUN 26 (*)    Creatinine, Ser 1.83 (*)    Total Protein 9.7 (*)    AST 62 (*)    Total Bilirubin 1.3 (*)    GFR, Estimated 46 (*)    Anion gap 17 (*)    All other components within normal limits  CBC - Abnormal; Notable for the following components:   RBC 4.15 (*)    Hemoglobin 12.9 (*)    HCT 37.3 (*)    All other components within normal limits  MAGNESIUM - Abnormal; Notable for the following components:   Magnesium 1.4 (*)    All other components within normal limits  LIPASE, BLOOD  ETHANOL  URINALYSIS, ROUTINE W REFLEX MICROSCOPIC  POC OCCULT BLOOD, ED    EKG EKG Interpretation  Date/Time:  Sunday February 03 2021 19:36:37 EDT Ventricular Rate:  77 PR Interval:  166 QRS Duration: 94 QT Interval:  463 QTC Calculation: 525 R  Axis:   65 Text Interpretation: Sinus rhythm  Ventricular premature complex Prolonged QT interval Confirmed by Rolan Bucco [phone]) on 02/03/2021 7:58:42 PM   Radiology No results found.  Procedures Procedures   Medications Ordered in ED Medications  ondansetron (ZOFRAN-ODT) disintegrating tablet 4 mg (4 mg Oral Given 02/03/21 1410)  sodium chloride 0.9 % bolus 1,000 mL (0 mLs Intravenous Stopped 02/03/21 1818)  pantoprazole (PROTONIX) injection 40 mg (40 mg Intravenous Given 02/03/21 1620)  magnesium sulfate IVPB 2 g 50 mL (0 g Intravenous Stopped 02/03/21 1818)  potassium chloride SA (KLOR-CON) CR tablet 40 mEq (40 mEq Oral Given 02/03/21 1726)    ED Course  I have reviewed the triage vital signs and the nursing notes.  Pertinent labs & imaging results that were available during my care of the patient were reviewed by me and considered in my medical decision making (see chart for details).    MDM Rules/Calculators/A&P                          Patient is a 45 year old male who presents with reported hematemesis.  His last episode of hematemesis was reported to be greater than 12 hours ago.  He has had some ongoing vomiting since that time with no blood.  He has not had any vomiting since arrival to the ED.  His abdomen had some minor epigastric tenderness but no pain on reexam.  He was given IV fluids and Protonix.  His vital signs are stable.  His labs are nonconcerning.  His hemoglobin is stable.  His glucose is mildly elevated but similar to prior values.  His creatinine is just slightly higher than prior values.  He has some chronic mild elevation in his LFTs which appears to be unchanged.  He did have a little bit low potassium which is better than his last visit in the ED.  He was given some potassium replacement.  His magnesium level is low.  He was given magnesium replacement.  He is tolerating oral fluids without difficulty.  His EKG did show a triplet of PVCs.  He is currently having some  intermittent PVCs but nothing that is prolonged or coupled.  He has not had any other arrhythmias or more sustained episodes of PVCs or V. tach in the ED.  He was discharged home in good condition.  He was encouraged to have follow-up with his PCP to have his liver electrolytes rechecked.  Return precautions were given. Final Clinical Impression(s) / ED Diagnoses Final diagnoses:  Hematemesis with nausea  Hypokalemia  Hypomagnesemia    Rx / DC Orders ED Discharge Orders         Ordered    pantoprazole (PROTONIX) 20 MG tablet  Daily        02/03/21 2114           Rolan Bucco, MD 02/03/21 2119

## 2021-02-03 NOTE — Discharge Instructions (Signed)
Refrain from drinking alcohol.  You need to have your electrolytes rechecked within the next few days by your primary care doctor.  I have prescribed a medication to help with your stomach.  Return here as needed if you have any worsening symptoms.

## 2021-02-12 ENCOUNTER — Inpatient Hospital Stay (HOSPITAL_COMMUNITY)
Admission: EM | Admit: 2021-02-12 | Discharge: 2021-02-14 | DRG: 640 | Disposition: A | Discharge status: Home / Self Care | Payer: Self-pay | Attending: Internal Medicine | Admitting: Internal Medicine

## 2021-02-12 ENCOUNTER — Encounter (HOSPITAL_COMMUNITY): Payer: Self-pay

## 2021-02-12 ENCOUNTER — Other Ambulatory Visit: Payer: Self-pay

## 2021-02-12 DIAGNOSIS — M109 Gout, unspecified: Secondary | ICD-10-CM | POA: Diagnosis present

## 2021-02-12 DIAGNOSIS — N179 Acute kidney failure, unspecified: Secondary | ICD-10-CM

## 2021-02-12 DIAGNOSIS — E1165 Type 2 diabetes mellitus with hyperglycemia: Secondary | ICD-10-CM | POA: Diagnosis present

## 2021-02-12 DIAGNOSIS — I480 Paroxysmal atrial fibrillation: Secondary | ICD-10-CM | POA: Diagnosis present

## 2021-02-12 DIAGNOSIS — Z8249 Family history of ischemic heart disease and other diseases of the circulatory system: Secondary | ICD-10-CM

## 2021-02-12 DIAGNOSIS — Z20822 Contact with and (suspected) exposure to covid-19: Secondary | ICD-10-CM | POA: Diagnosis present

## 2021-02-12 DIAGNOSIS — K859 Acute pancreatitis without necrosis or infection, unspecified: Secondary | ICD-10-CM | POA: Diagnosis present

## 2021-02-12 DIAGNOSIS — E860 Dehydration: Principal | ICD-10-CM | POA: Diagnosis present

## 2021-02-12 DIAGNOSIS — Z833 Family history of diabetes mellitus: Secondary | ICD-10-CM

## 2021-02-12 DIAGNOSIS — K920 Hematemesis: Secondary | ICD-10-CM | POA: Diagnosis present

## 2021-02-12 DIAGNOSIS — E876 Hypokalemia: Secondary | ICD-10-CM | POA: Diagnosis present

## 2021-02-12 DIAGNOSIS — K2920 Alcoholic gastritis without bleeding: Secondary | ICD-10-CM | POA: Diagnosis present

## 2021-02-12 DIAGNOSIS — Z86711 Personal history of pulmonary embolism: Secondary | ICD-10-CM

## 2021-02-12 DIAGNOSIS — Z79899 Other long term (current) drug therapy: Secondary | ICD-10-CM

## 2021-02-12 DIAGNOSIS — K219 Gastro-esophageal reflux disease without esophagitis: Secondary | ICD-10-CM | POA: Diagnosis present

## 2021-02-12 DIAGNOSIS — F419 Anxiety disorder, unspecified: Secondary | ICD-10-CM | POA: Diagnosis present

## 2021-02-12 DIAGNOSIS — Z86718 Personal history of other venous thrombosis and embolism: Secondary | ICD-10-CM

## 2021-02-12 DIAGNOSIS — I10 Essential (primary) hypertension: Secondary | ICD-10-CM | POA: Diagnosis present

## 2021-02-12 DIAGNOSIS — Z87891 Personal history of nicotine dependence: Secondary | ICD-10-CM

## 2021-02-12 DIAGNOSIS — F1010 Alcohol abuse, uncomplicated: Secondary | ICD-10-CM | POA: Diagnosis present

## 2021-02-12 DIAGNOSIS — R112 Nausea with vomiting, unspecified: Secondary | ICD-10-CM

## 2021-02-12 DIAGNOSIS — F32A Depression, unspecified: Secondary | ICD-10-CM | POA: Diagnosis present

## 2021-02-12 DIAGNOSIS — Z7289 Other problems related to lifestyle: Secondary | ICD-10-CM

## 2021-02-12 DIAGNOSIS — E871 Hypo-osmolality and hyponatremia: Secondary | ICD-10-CM

## 2021-02-12 DIAGNOSIS — F1090 Alcohol use, unspecified, uncomplicated: Secondary | ICD-10-CM

## 2021-02-12 DIAGNOSIS — Z789 Other specified health status: Secondary | ICD-10-CM

## 2021-02-12 LAB — CBC WITH DIFFERENTIAL/PLATELET
Abs Immature Granulocytes: 0.05 10*3/uL (ref 0.00–0.07)
Basophils Absolute: 0 10*3/uL (ref 0.0–0.1)
Basophils Relative: 0 %
Eosinophils Absolute: 0 10*3/uL (ref 0.0–0.5)
Eosinophils Relative: 0 %
HCT: 41.5 % (ref 39.0–52.0)
Hemoglobin: 14.9 g/dL (ref 13.0–17.0)
Immature Granulocytes: 1 %
Lymphocytes Relative: 17 %
Lymphs Abs: 1.3 10*3/uL (ref 0.7–4.0)
MCH: 31 pg (ref 26.0–34.0)
MCHC: 35.9 g/dL (ref 30.0–36.0)
MCV: 86.5 fL (ref 80.0–100.0)
Monocytes Absolute: 0.6 10*3/uL (ref 0.1–1.0)
Monocytes Relative: 8 %
Neutro Abs: 5.7 10*3/uL (ref 1.7–7.7)
Neutrophils Relative %: 74 %
Platelets: 522 10*3/uL — ABNORMAL HIGH (ref 150–400)
RBC: 4.8 MIL/uL (ref 4.22–5.81)
RDW: 12.6 % (ref 11.5–15.5)
WBC: 7.7 10*3/uL (ref 4.0–10.5)
nRBC: 0 % (ref 0.0–0.2)

## 2021-02-12 LAB — URINALYSIS, ROUTINE W REFLEX MICROSCOPIC
Bilirubin Urine: NEGATIVE
Glucose, UA: 50 mg/dL — AB
Hgb urine dipstick: NEGATIVE
Ketones, ur: NEGATIVE mg/dL
Leukocytes,Ua: NEGATIVE
Nitrite: NEGATIVE
Protein, ur: 30 mg/dL — AB
Specific Gravity, Urine: 1.019 (ref 1.005–1.030)
pH: 5 (ref 5.0–8.0)

## 2021-02-12 LAB — COMPREHENSIVE METABOLIC PANEL
ALT: 40 U/L (ref 0–44)
ALT: 29 U/L (ref 0–44)
AST: 54 U/L — ABNORMAL HIGH (ref 15–41)
AST: 35 U/L (ref 15–41)
Albumin: 4.8 g/dL (ref 3.5–5.0)
Albumin: 3.7 g/dL (ref 3.5–5.0)
Alkaline Phosphatase: 90 U/L (ref 38–126)
Alkaline Phosphatase: 66 U/L (ref 38–126)
Anion gap: 20 — ABNORMAL HIGH (ref 5–15)
Anion gap: 11 (ref 5–15)
BUN: 28 mg/dL — ABNORMAL HIGH (ref 6–20)
BUN: 24 mg/dL — ABNORMAL HIGH (ref 6–20)
CO2: 20 mmol/L — ABNORMAL LOW (ref 22–32)
CO2: 21 mmol/L — ABNORMAL LOW (ref 22–32)
Calcium: 9.8 mg/dL (ref 8.9–10.3)
Calcium: 8.6 mg/dL — ABNORMAL LOW (ref 8.9–10.3)
Chloride: 82 mmol/L — ABNORMAL LOW (ref 98–111)
Chloride: 92 mmol/L — ABNORMAL LOW (ref 98–111)
Creatinine, Ser: 2.04 mg/dL — ABNORMAL HIGH (ref 0.61–1.24)
Creatinine, Ser: 1.35 mg/dL — ABNORMAL HIGH (ref 0.61–1.24)
GFR, Estimated: 41 mL/min — ABNORMAL LOW (ref >60)
GFR, Estimated: >60 mL/min (ref >60)
Glucose, Bld: 223 mg/dL — ABNORMAL HIGH (ref 70–99)
Glucose, Bld: 159 mg/dL — ABNORMAL HIGH (ref 70–99)
Potassium: 3.4 mmol/L — ABNORMAL LOW (ref 3.5–5.1)
Potassium: 3.3 mmol/L — ABNORMAL LOW (ref 3.5–5.1)
Sodium: 122 mmol/L — ABNORMAL LOW (ref 135–145)
Sodium: 124 mmol/L — ABNORMAL LOW (ref 135–145)
Total Bilirubin: 0.8 mg/dL (ref 0.3–1.2)
Total Bilirubin: 0.7 mg/dL (ref 0.3–1.2)
Total Protein: 10.2 g/dL — ABNORMAL HIGH (ref 6.5–8.1)
Total Protein: 7.6 g/dL (ref 6.5–8.1)

## 2021-02-12 LAB — RESP PANEL BY RT-PCR (FLU A&B, COVID) ARPGX2
Influenza A by PCR: NEGATIVE
Influenza B by PCR: NEGATIVE
SARS Coronavirus 2 by RT PCR: NEGATIVE

## 2021-02-12 LAB — RAPID URINE DRUG SCREEN, HOSP PERFORMED
Amphetamines: NOT DETECTED
Barbiturates: NOT DETECTED
Benzodiazepines: NOT DETECTED
Cocaine: NOT DETECTED
Opiates: NOT DETECTED
Tetrahydrocannabinol: POSITIVE — AB

## 2021-02-12 LAB — HEMOGLOBIN A1C
Hgb A1c MFr Bld: 6.1 % — ABNORMAL HIGH (ref 4.8–5.6)
Mean Plasma Glucose: 128.37 mg/dL

## 2021-02-12 LAB — CBC
HCT: 33.5 % — ABNORMAL LOW (ref 39.0–52.0)
Hemoglobin: 11.9 g/dL — ABNORMAL LOW (ref 13.0–17.0)
MCH: 31.4 pg (ref 26.0–34.0)
MCHC: 35.5 g/dL (ref 30.0–36.0)
MCV: 88.4 fL (ref 80.0–100.0)
Platelets: 446 10*3/uL — ABNORMAL HIGH (ref 150–400)
RBC: 3.79 MIL/uL — ABNORMAL LOW (ref 4.22–5.81)
RDW: 12.8 % (ref 11.5–15.5)
WBC: 8.6 10*3/uL (ref 4.0–10.5)
nRBC: 0 % (ref 0.0–0.2)

## 2021-02-12 LAB — MAGNESIUM: Magnesium: 1.8 mg/dL (ref 1.7–2.4)

## 2021-02-12 LAB — LIPASE, BLOOD: Lipase: 60 U/L — ABNORMAL HIGH (ref 11–51)

## 2021-02-12 LAB — PHOSPHORUS: Phosphorus: 2.4 mg/dL — ABNORMAL LOW (ref 2.5–4.6)

## 2021-02-12 LAB — GLUCOSE, CAPILLARY: Glucose-Capillary: 128 mg/dL — ABNORMAL HIGH (ref 70–99)

## 2021-02-12 LAB — ETHANOL: Alcohol, Ethyl (B): <10 mg/dL (ref <10)

## 2021-02-12 MED ORDER — THIAMINE HCL 100 MG/ML IJ SOLN: 100.0000 mg | Freq: Every day | INTRAMUSCULAR | Status: DC

## 2021-02-12 MED ORDER — SODIUM CHLORIDE 0.9 % IV SOLN: INTRAVENOUS | Status: AC

## 2021-02-12 MED ORDER — SODIUM CHLORIDE 0.9 % IV BOLUS
1000.0000 mL | Freq: Once | INTRAVENOUS | Status: AC
  Administered 2021-02-12: 1000 mL via INTRAVENOUS

## 2021-02-12 MED ORDER — ADULT MULTIVITAMIN W/MINERALS CH
1.0000 | ORAL_TABLET | Freq: Every day | ORAL | Status: DC
  Administered 2021-02-12 – 2021-02-14 (×3): 1 via ORAL
  Filled 2021-02-12 (×3): qty 1

## 2021-02-12 MED ORDER — PANTOPRAZOLE SODIUM 40 MG IV SOLR
40.0000 mg | Freq: Two times a day (BID) | INTRAVENOUS | Status: DC
  Administered 2021-02-12 – 2021-02-13 (×3): 40 mg via INTRAVENOUS
  Filled 2021-02-12 (×3): qty 40

## 2021-02-12 MED ORDER — ONDANSETRON HCL 4 MG/2ML IJ SOLN
4.0000 mg | Freq: Once | INTRAMUSCULAR | Status: AC
  Administered 2021-02-12: 4 mg via INTRAVENOUS
  Filled 2021-02-12: qty 2

## 2021-02-12 MED ORDER — LORAZEPAM 2 MG/ML IJ SOLN
1.0000 mg | Freq: Once | INTRAMUSCULAR | Status: AC
  Administered 2021-02-12: 1 mg via INTRAVENOUS
  Filled 2021-02-12: qty 1

## 2021-02-12 MED ORDER — LORAZEPAM 1 MG PO TABS
1.0000 mg | ORAL_TABLET | ORAL | Status: DC | PRN
Start: 2021-02-12 — End: 2021-02-14

## 2021-02-12 MED ORDER — MORPHINE SULFATE (PF) 2 MG/ML IV SOLN
2.0000 mg | INTRAVENOUS | Status: DC | PRN
Start: 2021-02-12 — End: 2021-02-14

## 2021-02-12 MED ORDER — LORAZEPAM 2 MG/ML IJ SOLN: 1.0000 mg | INTRAMUSCULAR | Status: DC | PRN

## 2021-02-12 MED ORDER — LACTATED RINGERS IV SOLN: INTRAVENOUS | Status: DC

## 2021-02-12 MED ORDER — HYDRALAZINE HCL 20 MG/ML IJ SOLN: 10.0000 mg | Freq: Three times a day (TID) | INTRAMUSCULAR | Status: DC | PRN

## 2021-02-12 MED ORDER — FOLIC ACID 1 MG PO TABS
1.0000 mg | ORAL_TABLET | Freq: Every day | ORAL | Status: DC
  Administered 2021-02-12 – 2021-02-14 (×3): 1 mg via ORAL
  Filled 2021-02-12 (×3): qty 1

## 2021-02-12 MED ORDER — THIAMINE HCL 100 MG PO TABS
100.0000 mg | ORAL_TABLET | Freq: Every day | ORAL | Status: DC
  Administered 2021-02-12 – 2021-02-14 (×3): 100 mg via ORAL
  Filled 2021-02-12 (×3): qty 1

## 2021-02-12 MED ORDER — THIAMINE HCL 100 MG/ML IJ SOLN
Freq: Once | INTRAVENOUS | Status: AC
  Filled 2021-02-12: qty 1000

## 2021-02-12 MED ORDER — ONDANSETRON HCL 4 MG PO TABS: 4.0000 mg | ORAL_TABLET | Freq: Four times a day (QID) | ORAL | Status: DC | PRN

## 2021-02-12 MED ORDER — INSULIN ASPART 100 UNIT/ML ~~LOC~~ SOLN
0.0000 [IU] | Freq: Three times a day (TID) | SUBCUTANEOUS | Status: DC
  Administered 2021-02-13 (×2): 1 [IU] via SUBCUTANEOUS

## 2021-02-12 MED ORDER — POTASSIUM CHLORIDE CRYS ER 20 MEQ PO TBCR
20.0000 meq | EXTENDED_RELEASE_TABLET | Freq: Once | ORAL | Status: AC
  Administered 2021-02-12: 20 meq via ORAL
  Filled 2021-02-12: qty 1

## 2021-02-12 MED ORDER — ONDANSETRON HCL 4 MG/2ML IJ SOLN: 4.0000 mg | Freq: Four times a day (QID) | INTRAMUSCULAR | Status: DC | PRN

## 2021-02-12 NOTE — ED Notes (Signed)
Pt's girlfriend Selena Batten called. Provided brief update.

## 2021-02-12 NOTE — H&P (Signed)
History and Physical        Hospital Admission Note Date: 02/12/2021  Patient name: Richard Lopez Medical record number: [id number] Date of birth: May 04, 1976 Age: 45 y.o. Gender: male  PCP: [name], NP    Chief Complaint    Chief Complaint  Patient presents with  . Emesis      HPI:   This is a 45 year old male with a past medical history of paroxysmal A. Fib not on anticoagulation, alcohol abuse, DTs, gastritis, hypertension, diet-controlled diabetes, depression, right hip fracture and subsequent provoked DVT/PE in 2020 who presented to the ED with abdominal pain and vomiting blood for 12 days.  He has been seen in the ED several times in the past 2 weeks for a presyncopal episode, nausea and vomiting and hematemesis but was discharged from the ED in stable condition. Patient states that he had his last beer on Tuesday of last week and since then had nausea and vomiting with poor PO intake. Reports bright red and dark red blood. He says that he had an EGD once and had his tooth knocked out so does not want another EGD done. Admits to epigastric pain as well. Says when he first stopped drinking last week he had some hallucinations but this has since resolved and he does not feel like he's withdrawing. No bloody BMs. No other symptoms.    ED Course: Afebrile, hemodynamically stable, on room air. Notable Labs: Sodium 122, K3.4, chloride 82, CO2 20, glucose 223, BUN 28, creatinine 2.04, AG 20, lipase 60, AST 54, ALT 40, T protein 10.2, WBC 7.7, Hb 14.9, platelets 522, COVID-19 pending.Patient received a banana bag and 2 L NS bolus, Ativan and Zofran.    Vitals:   02/12/21 1637 02/12/21 1733  BP: 135/87 135/90  Pulse: 73 64  Resp: 12 16  Temp: 98.5 F (36.9 C) 98.2 F (36.8 C)  SpO2: 100% 100%     Review of Systems:  Review of Systems  All other systems reviewed and  are negative.   Medical/Social/Family History   Past Medical History: Past Medical History:  Diagnosis Date  . Anxiety   . 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 Depression   . Diabetes mellitus    diet controlled  . Essential hypertension   . Gastritis   . Gout   . Normocytic anemia due to blood loss 08/21/2019  . Pulmonary embolism [facility])     Past Surgical History:  Procedure Laterality Date  . ESOPHAGOGASTRODUODENOSCOPY (EGD) WITH PROPOFOL N/A 08/14/2019   Procedure: ESOPHAGOGASTRODUODENOSCOPY (EGD) WITH PROPOFOL;  Surgeon: Charlott Rakes, MD;  [facility]: [facility];  Service: Endoscopy;  Laterality: N/A;  . EXTERNAL FIXATION LEG Left 11/07/2018   Procedure: EXTERNAL FIXATION LEFT LOWER LEG;  Surgeon: Samson Frederic, MD;  Location: [facility];  Service: Orthopedics;  Laterality: Left;  . EXTERNAL FIXATION REMOVAL Left 11/08/2018   Procedure: REMOVAL EXTERNAL FIXATION LEG;  Surgeon: Roby Lofts, MD;  Location: [facility] OR;  Service: Orthopedics;  Laterality: Left;  . INTRAMEDULLARY (IM) NAIL INTERTROCHANTERIC Right 03/26/2019   Procedure: INTRAMEDULLARY (IM) NAIL INTERTROCHANTRIC;  Surgeon: Roby Lofts, MD;  Location: [facility] OR;  Service: Orthopedics;  Laterality: Right;  .  INTRAMEDULLARY (IM) NAIL INTERTROCHANTERIC Right 05/17/2019   Procedure: Intramedullary (Im) Nail Intertroch with circlage wiring;  Surgeon: Myrene Galas, MD;  Location: [facility];  Service: Orthopedics;  Laterality: Right;  . NO PAST SURGERIES    . OPEN REDUCTION INTERNAL FIXATION (ORIF) TIBIA/FIBULA FRACTURE Left 11/08/2018   Procedure: OPEN REDUCTION INTERNAL FIXATION (ORIF) TIBIA/FIBULA FRACTURE;  Surgeon: Roby Lofts, MD;  [facility]: [facility];  Service: Orthopedics;  Laterality: Left;  . ORIF FEMUR FRACTURE Right 05/17/2019   Procedure: REMOVAL  OF HARDWARE;  Surgeon: Myrene Galas, MD;  Location: [facility] OR;  Service: Orthopedics;  Laterality: Right;    Medications: Prior to Admission medications   Medication Sig  Start Date End Date Taking? Authorizing Provider  azithromycin (ZITHROMAX) 250 MG tablet TAKE 2 TABLETS DAY 1, THAN 1 TABLET UNTIL GONE Patient not taking: No sig reported 11/23/20 11/23/21  [name], NP  fluticasone (FLONASE) 50 MCG/ACT nasal spray PLACE 2 SPRAYS INTO BOTH NOSTRILS DAILY. Patient not taking: No sig reported 11/07/20 11/07/21  [name], NP  loratadine (CLARITIN) 10 MG tablet TAKE 1 TABLET (10 MG TOTAL) BY MOUTH DAILY. Patient not taking: No sig reported 11/07/20 11/07/21  [name], NP  Metoprolol Tartrate 75 MG TABS Take 75 mg by mouth 2 (two) times daily. Patient not taking: No sig reported 10/24/20 01/22/21  Noralee Stain, DO  pantoprazole (PROTONIX) 20 MG tablet Take 1 tablet (20 mg total) by mouth daily. Patient not taking: Reported on 02/12/2021 02/03/21   Rolan Bucco, MD    Allergies:  No Known Allergies  Social History:  reports that he has quit smoking. His smoking use included cigarettes. He smoked 0.00 packs per day. He has never used smokeless tobacco. He reports current alcohol use of about 200.0 standard drinks of alcohol per week. He reports current drug use. Drug: Marijuana.  Family History: Family History  Problem Relation Age of Onset  . Diabetes Mellitus II Father   . Diabetes Mellitus II Other   . CAD Other      Objective   Physical Exam: Blood pressure 135/90, pulse 64, temperature 98.2 F (36.8 C), temperature source Oral, resp. rate 16, SpO2 100 %.  Physical Exam Vitals and nursing note reviewed.  Constitutional:      Appearance: Normal appearance.  HENT:     Head: Normocephalic and atraumatic.  Eyes:     Conjunctiva/sclera: Conjunctivae normal.  Cardiovascular:     Rate and Rhythm: Normal rate and regular rhythm.  Pulmonary:     Effort: Pulmonary effort is normal.     Breath sounds: Normal breath sounds.  Abdominal:     General: Abdomen is flat.     Palpations: Abdomen is soft.     Tenderness: There is  abdominal tenderness in the epigastric area.  Musculoskeletal:        General: No swelling or tenderness.  Skin:    Coloration: Skin is not jaundiced or pale.  Neurological:     Mental Status: He is alert. Mental status is at baseline.  Psychiatric:        Mood and Affect: Mood normal.        Behavior: Behavior normal.     LABS on Admission: I have personally reviewed all the labs and imaging below    Basic Metabolic Panel: Recent Labs  Lab 02/12/21 0803  NA 122*  K 3.4*  CL 82*  CO2 20*  GLUCOSE 223*  BUN 28*  CREATININE 2.04*  CALCIUM 9.8  Liver Function Tests: Recent Labs  Lab 02/12/21 0803  AST 54*  ALT 40  ALKPHOS 90  BILITOT 0.8  PROT 10.2*  ALBUMIN 4.8   Recent Labs  Lab 02/12/21 0803  LIPASE 60*   No results for input(s): AMMONIA in the last 168 hours. CBC: Recent Labs  Lab 02/12/21 0803  WBC 7.7  NEUTROABS 5.7  HGB 14.9  HCT 41.5  MCV 86.5  PLT 522*   Cardiac Enzymes: No results for input(s): CKTOTAL, CKMB, CKMBINDEX, TROPONINI in the last 168 hours. BNP: Invalid input(s): POCBNP CBG: No results for input(s): GLUCAP in the last 168 hours.  Radiological Exams on Admission:  No results found.    EKG: Not done   A & P   Principal Problem:   Intractable nausea and vomiting Active Problems:   Alcohol use   Hypokalemia   Hyponatremia   AKI (acute kidney injury) (HCC)   1. Intractable Nausea and vomiting with reported hematemesis a. Possibly exacerbated by recovering alcoholic pancreatitis as his Lipase is 60 and AST is slightly up - follow up tomorrow b. EGD on 08/14/2019 with reflux esophagitis, gastritis and esophageal mucosal changes suspicious for eosinophilic esophagitis c. BUN is 28 and elevated from baseline which could be from his AKI or UGI bleed, however no further vomiting, his Hb is stable and he's refusing intervention at this time so will hold off on GI consult for now - if he has recurrent symptoms then would  call GI d. Protonix 40 mg IV BID e. IV fluids f. Clear liquid diet  2. Hyponatremia a. IV fluids  3. Hypokalemia a. Repleted. Follow up in AM  4. Hyperglycemia a. Sensitive sliding scale b. Hemoglobin A1c  5. AKI a. Cr 2.04, baseline 0.95 in December, 2021 b. IV fluids as above  6. Alcohol abuse a. Encourage cessation b. CIWA protocol   DVT prophylaxis: SCDs   Code Status: Full Code  Diet: CLD Family Communication: Admission, patients condition and plan of care including tests being ordered have been discussed with the patient who indicates understanding and agrees with the plan and Code Status.  Disposition Plan: The appropriate patient status for this patient is OBSERVATION. Observation status is judged to be reasonable and necessary in order to provide the required intensity of service to ensure the patient's safety. The patient's presenting symptoms, physical exam findings, and initial radiographic and laboratory data in the context of their medical condition is felt to place them at decreased risk for further clinical deterioration. Furthermore, it is anticipated that the patient will be medically stable for discharge from the hospital within 2 midnights of admission. The following factors support the patient status of observation.   " The patient's presenting symptoms include nausea/vomiting hematemesis. " The physical exam findings include epigastric pain. " The initial radiographic and laboratory data are as above.    Consultants  . none  Procedures  . none  Time Spent on Admission: 60 minutes    Jae Dire, DO [facility]  02/12/2021, 5:40 PM

## 2021-02-12 NOTE — ED Provider Notes (Signed)
[facility]-EMERGENCY DEPT Provider Note   CSN: [id number] Arrival date & time: 02/12/21  2694     History Chief Complaint  Patient presents with  . Emesis    Richard Lopez is a 45 y.o. male.  The history is provided by the patient. No language interpreter was used.  Emesis Severity:  Moderate Timing:  Constant Number of daily episodes:  Multiple Progression:  Worsening Chronicity:  New Recent urination:  Normal Relieved by:  Nothing Worsened by:  Nothing Associated symptoms: abdominal pain   Risk factors: no sick contacts   Pt complains of body aches and pain      Past Medical History:  Diagnosis Date  . Anxiety   . 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 Depression   . Diabetes mellitus    diet controlled  . Essential hypertension   . Gastritis   . Gout   . Normocytic anemia due to blood loss 08/21/2019  . Pulmonary embolism [facility])     Patient Active Problem List   Diagnosis Date Noted  . AF (paroxysmal atrial fibrillation) (HCC)   . Transaminitis   . Dehydration   . AKI (acute kidney injury) (HCC) 06/18/2020  . Atrial fibrillation with RVR (HCC) 04/22/2020  . 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  . Hyponatremia 04/21/2020  . Aspiration pneumonia (HCC) 04/21/2020  . Esophagitis determined by endoscopy 08/21/2019  . Anemia 08/21/2019  . Hypokalemia 08/17/2019  . Hypomagnesemia 08/17/2019  . Hemoptysis 08/17/2019  . Acute upper GI bleed 08/14/2019  . Hematemesis 08/13/2019  . Cavitary lesion of lung 08/01/2019  . Gout 05/18/2019  . Peri-prosthetic fracture of femur at tip of prosthesis 05/16/2019  . Alcohol use 05/16/2019  . Intertrochanteric fracture of right hip (HCC) 04/14/2019  . Vitamin D deficiency 04/14/2019  . DTs (delirium tremens) (HCC) 04/07/2019  . Delirium tremens (HCC) 04/07/2019  . Encephalopathy acute   . Closed right hip fracture, initial encounter (HCC) 03/25/2019  . Alcohol dependence with intoxication (HCC)  03/25/2019  . Hypertensive urgency 03/25/2019  . Thrombocytopenia (HCC) 03/25/2019  . Effusion, right knee 03/25/2019  . Closed fracture of right femur (HCC)   . High anion gap metabolic acidosis   . Prediabetes 04/02/2017  . Gastritis 04/02/2017  . Marijuana abuse 04/01/2017  . Nausea with vomiting 07/29/2014  . Type 2 diabetes mellitus with hyperlipidemia (HCC) 07/29/2014  . Essential hypertension 07/29/2014  . Nausea & vomiting 07/29/2014    Past Surgical History:  Procedure Laterality Date  . ESOPHAGOGASTRODUODENOSCOPY (EGD) WITH PROPOFOL N/A 08/14/2019   Procedure: ESOPHAGOGASTRODUODENOSCOPY (EGD) WITH PROPOFOL;  Surgeon: Charlott Rakes, MD;  Location: [facility];  Service: Endoscopy;  Laterality: N/A;  . EXTERNAL FIXATION LEG Left 11/07/2018   Procedure: EXTERNAL FIXATION LEFT LOWER LEG;  Surgeon: Samson Frederic, MD;  Location: [facility] ORS;  Service: Orthopedics;  Laterality: Left;  . EXTERNAL FIXATION REMOVAL Left 11/08/2018   Procedure: REMOVAL EXTERNAL FIXATION LEG;  Surgeon: Roby Lofts, MD;  Location: [facility] OR;  Service: Orthopedics;  Laterality: Left;  . INTRAMEDULLARY (IM) NAIL INTERTROCHANTERIC Right 03/26/2019   Procedure: INTRAMEDULLARY (IM) NAIL INTERTROCHANTRIC;  Surgeon: Roby Lofts, MD;  Location: [facility] OR;  Service: Orthopedics;  Laterality: Right;  . INTRAMEDULLARY (IM) NAIL INTERTROCHANTERIC Right 05/17/2019   Procedure: Intramedullary (Im) Nail Intertroch with circlage wiring;  Surgeon: Myrene Galas, MD;  Location: [facility] OR;  Service: Orthopedics;  Laterality: Right;  . NO PAST SURGERIES    . OPEN REDUCTION INTERNAL FIXATION (ORIF) TIBIA/FIBULA  FRACTURE Left 11/08/2018   Procedure: OPEN REDUCTION INTERNAL FIXATION (ORIF) TIBIA/FIBULA FRACTURE;  Surgeon: Roby Lofts, MD;  Location: [facility] OR;  Service: Orthopedics;  Laterality: Left;  . ORIF FEMUR FRACTURE Right 05/17/2019   Procedure: REMOVAL  OF HARDWARE;  Surgeon: Myrene Galas, MD;  Location: [facility] OR;  Service:  Orthopedics;  Laterality: Right;       Family History  Problem Relation Age of Onset  . Diabetes Mellitus II Father   . Diabetes Mellitus II Other   . CAD Other     Social History   Tobacco Use  . Smoking status: Former Smoker    Packs/day: 0.00    Types: Cigarettes  . Smokeless tobacco: Never Used  . Tobacco comment: About 1 cigarette per day or less  Vaping Use  . Vaping Use: Never used  Substance Use Topics  . Alcohol use: Yes    Alcohol/week: 200.0 standard drinks    Types: 200 Cans of beer per week  . Drug use: Yes    Types: Marijuana    Home Medications Prior to Admission medications   Medication Sig Start Date End Date Taking? Authorizing Provider  azithromycin (ZITHROMAX) 250 MG tablet TAKE 2 TABLETS DAY 1, THAN 1 TABLET UNTIL GONE Patient not taking: No sig reported 11/23/20 11/23/21  [name], NP  fluticasone (FLONASE) 50 MCG/ACT nasal spray PLACE 2 SPRAYS INTO BOTH NOSTRILS DAILY. Patient not taking: No sig reported 11/07/20 11/07/21  [name], NP  loratadine (CLARITIN) 10 MG tablet TAKE 1 TABLET (10 MG TOTAL) BY MOUTH DAILY. Patient not taking: No sig reported 11/07/20 11/07/21  [name], NP  Metoprolol Tartrate 75 MG TABS Take 75 mg by mouth 2 (two) times daily. Patient not taking: No sig reported 10/24/20 01/22/21  Noralee Stain, DO  pantoprazole (PROTONIX) 20 MG tablet Take 1 tablet (20 mg total) by mouth daily. Patient not taking: Reported on 02/12/2021 02/03/21   Rolan Bucco, MD    Allergies    Patient has no known allergies.  Review of Systems   Review of Systems  Gastrointestinal: Positive for abdominal pain and vomiting.  All other systems reviewed and are negative.   Physical Exam Updated Vital Signs BP (!) 171/105 (BP Location: Right Arm)   Pulse 93   Temp 98.3 F (36.8 C) (Oral)   Resp 18   SpO2 100%   Physical Exam Vitals and nursing note reviewed.  Constitutional:      Appearance: He is well-developed.   HENT:     Head: Normocephalic and atraumatic.  Eyes:     Conjunctiva/sclera: Conjunctivae normal.  Cardiovascular:     Rate and Rhythm: Normal rate and regular rhythm.     Pulses: Normal pulses.     Heart sounds: No murmur heard.   Pulmonary:     Effort: Pulmonary effort is normal. No respiratory distress.     Breath sounds: Normal breath sounds.  Abdominal:     Palpations: Abdomen is soft.     Tenderness: There is no abdominal tenderness.  Musculoskeletal:        General: Normal range of motion.     Cervical back: Neck supple.  Skin:    General: Skin is warm and dry.  Neurological:     General: No focal deficit present.     Mental Status: He is alert.  Psychiatric:        Mood and Affect: Mood normal.     ED Results / Procedures / Treatments  Labs (all labs ordered are listed, but only abnormal results are displayed) Labs Reviewed  CBC WITH DIFFERENTIAL/PLATELET - Abnormal; Notable for the following components:      Result Value   Platelets 522 (*)    All other components within normal limits  COMPREHENSIVE METABOLIC PANEL - Abnormal; Notable for the following components:   Sodium 122 (*)    Potassium 3.4 (*)    Chloride 82 (*)    CO2 20 (*)    Glucose, Bld 223 (*)    BUN 28 (*)    Creatinine, Ser 2.04 (*)    Total Protein 10.2 (*)    AST 54 (*)    GFR, Estimated 41 (*)    Anion gap 20 (*)    All other components within normal limits  LIPASE, BLOOD - Abnormal; Notable for the following components:   Lipase 60 (*)    All other components within normal limits  ETHANOL  RAPID URINE DRUG SCREEN, HOSP PERFORMED  URINALYSIS, ROUTINE W REFLEX MICROSCOPIC    EKG None  Radiology No results found.  Procedures Procedures   Medications Ordered in ED Medications  sodium chloride 0.9 % 1,000 mL with thiamine 100 mg, folic acid 1 mg, multivitamins adult 10 mL infusion ( Intravenous New Bag/Given 02/12/21 0851)  sodium chloride 0.9 % bolus 1,000 mL (0 mLs  Intravenous Stopped 02/12/21 0936)  ondansetron (ZOFRAN) injection 4 mg (4 mg Intravenous Given 02/12/21 0801)  LORazepam (ATIVAN) injection 1 mg (1 mg Intravenous Given 02/12/21 0801)    ED Course  I have reviewed the triage vital signs and the nursing notes.  Pertinent labs & imaging results that were available during my care of the patient were reviewed by me and considered in my medical decision making (see chart for details).    MDM Rules/Calculators/A&P                          MDM:  Pt given IV fluids Labs returned Pt has elevated renal functions, elevated lipase and lfts  Final Clinical Impression(s) / ED Diagnoses Final diagnoses:  Nausea and vomiting, intractability of vomiting not specified, unspecified vomiting type  Hyponatremia  Acute renal failure, unspecified acute renal failure type (HCC)    Rx / DC Orders ED Discharge Orders    None    I spoke with Hospitalist who will admit    Osie Cheeks 02/12/21 1452    Arby Barrette, MD 02/13/21 1256

## 2021-02-12 NOTE — ED Triage Notes (Signed)
Patient BIB [facility] from home. Patient reports vomiting blood for 12 days. Seen in ER twice once on the 1st and once on the 3rd. Sometimes its bright red, other times its dark. Patient complaining of leg cramping too.   EMS vitals 168/100 HR 90 RR 18 O2 100% Room air CBG 244 97.68F

## 2021-02-13 DIAGNOSIS — R112 Nausea with vomiting, unspecified: Secondary | ICD-10-CM

## 2021-02-13 LAB — BASIC METABOLIC PANEL
Anion gap: 11 (ref 5–15)
BUN: 21 mg/dL — ABNORMAL HIGH (ref 6–20)
CO2: 20 mmol/L — ABNORMAL LOW (ref 22–32)
Calcium: 8.6 mg/dL — ABNORMAL LOW (ref 8.9–10.3)
Chloride: 96 mmol/L — ABNORMAL LOW (ref 98–111)
Creatinine, Ser: 1.14 mg/dL (ref 0.61–1.24)
GFR, Estimated: >60 mL/min (ref >60)
Glucose, Bld: 134 mg/dL — ABNORMAL HIGH (ref 70–99)
Potassium: 3.3 mmol/L — ABNORMAL LOW (ref 3.5–5.1)
Sodium: 127 mmol/L — ABNORMAL LOW (ref 135–145)

## 2021-02-13 LAB — CBC
HCT: 32.5 % — ABNORMAL LOW (ref 39.0–52.0)
Hemoglobin: 11.3 g/dL — ABNORMAL LOW (ref 13.0–17.0)
MCH: 31.1 pg (ref 26.0–34.0)
MCHC: 34.8 g/dL (ref 30.0–36.0)
MCV: 89.5 fL (ref 80.0–100.0)
Platelets: 398 10*3/uL (ref 150–400)
RBC: 3.63 MIL/uL — ABNORMAL LOW (ref 4.22–5.81)
RDW: 13.2 % (ref 11.5–15.5)
WBC: 6.3 10*3/uL (ref 4.0–10.5)
nRBC: 0 % (ref 0.0–0.2)

## 2021-02-13 LAB — GLUCOSE, CAPILLARY
Glucose-Capillary: 125 mg/dL — ABNORMAL HIGH (ref 70–99)
Glucose-Capillary: 137 mg/dL — ABNORMAL HIGH (ref 70–99)
Glucose-Capillary: 110 mg/dL — ABNORMAL HIGH (ref 70–99)
Glucose-Capillary: 189 mg/dL — ABNORMAL HIGH (ref 70–99)

## 2021-02-13 LAB — LIPASE, BLOOD: Lipase: 56 U/L — ABNORMAL HIGH (ref 11–51)

## 2021-02-13 MED ORDER — K PHOS MONO-SOD PHOS DI & MONO 155-852-130 MG PO TABS
500.0000 mg | ORAL_TABLET | Freq: Two times a day (BID) | ORAL | Status: AC
  Administered 2021-02-13 (×2): 500 mg via ORAL
  Filled 2021-02-13 (×3): qty 2

## 2021-02-13 MED ORDER — SODIUM CHLORIDE 0.9 % IV SOLN: INTRAVENOUS | Status: DC

## 2021-02-13 MED ORDER — POTASSIUM CHLORIDE CRYS ER 20 MEQ PO TBCR
40.0000 meq | EXTENDED_RELEASE_TABLET | Freq: Once | ORAL | Status: AC
  Administered 2021-02-13: 40 meq via ORAL
  Filled 2021-02-13: qty 4

## 2021-02-13 NOTE — Progress Notes (Signed)
PROGRESS NOTE  Richard Lopez  DOB: 1976/02/06  PCP: [name], NP ELF:[id number]  DOA: 02/12/2021  LOS: 0 days   Chief Complaint  Patient presents with  . Emesis    Brief narrative: Richard Lopez is a 45 y.o. male with PMH significant for depression, chronic alcohol abuse, history of delirium tremens, alcoholic gastritis, HTN, diet-controlled diabetes, paroxysmal A. fib not on anticoagulation, history of right hip fracture and subsequent provoked DVT/PE in 2020. Patient was brought to the ED from home on 4/12 with complaint of progressively worsening abdominal pain as well as multiple episodes of vomiting, sometimes with blood, poor oral intake for last 2 weeks.   Reported last drink was a week prior to presentation after which she has some hallucinations that resolved by the time of presentation.  In the ED patient was afebrile, hemodynamically stable. Labs abnormal with sodium low at 122, potassium low at 3.4, glucose elevated to 223, creatinine elevated to 2.04, Patient was admitted to hospitalist service See below for details.  Subjective: Patient was seen and examined this morning.  Young African-American male.  Somnolent.  Open eyes on verbal command.  Gives short history and falls right back to sleep.  Not in alcohol withdrawal tremors.  No family at bedside.  Assessment/Plan: Intractable Nausea and vomiting with reported hematemesis -Patient probably had alcoholic gastritis and pancreatitis which seems to be resolving now.   -Lipase level only slightly elevated.   -Currently getting IV fluid, symptomatic management with IV Protonix, IV Zofran, IV fluid.   -Diet as tolerated. -EGD in October 2020 showed reflux esophagitis, gastritis and esophageal mucosal changes suspicious for eosinophilic esophagitis.  Chronic alcohol abuse -Counseled to quit.  AKI -Due to dehydration.  Improved with IV fluid. Recent Labs    07/16/20 1553 10/22/20 1105 10/23/20 0528  10/23/20 1359 10/24/20 0748 02/01/21 1407 02/03/21 1420 02/12/21 0803 02/12/21 2025 02/13/21 0545  BUN 5* 15 12 11 7  23* 26* 28* 24* 21*  CREATININE 0.72* 2.59* 1.36* 1.18 0.95 1.58* 1.83* 2.04* 1.35* 1.14   Hyponatremia -Only gradually improving with IV fluid.  Continue normal saline. Recent Labs  Lab 02/12/21 0803 02/12/21 2025 02/13/21 0545  NA 122* 124* 127*   Hypokalemia/hypophosphatemia/hypomagnesemia -Low level of electrolytes because of poor oral intake, nausea vomiting.  Replacements ordered.  Recheck tomorrow. Recent Labs  Lab 02/12/21 0803 02/12/21 2025 02/13/21 0545  K 3.4* 3.3* 3.3*  MG  --  1.8  --   PHOS  --  2.4*  --    Hyperglycemia -Prediabetes with A1c 6.1. -Dietary control recommended  Recent Labs  Lab 02/12/21 2145 02/13/21 0809 02/13/21 1205  GLUCAP 128* 125* 137*   Alcohol abuse Encourage cessation CIWA protocol  Mobility: Encourage ambulation Code Status:   Code Status: Full Code  Nutritional status: There is no height or weight on file to calculate BMI.     Diet Order            Diet regular Room service appropriate? Yes; Fluid consistency: Thin  Diet effective now                 DVT prophylaxis: SCDs Start: 02/12/21 1722   Antimicrobials:  None Fluid: Normal saline at 50 mill per hour Consultants: None Family Communication:  None at bedside  Status is: Inpatient  Remains inpatient appropriate because: Needs more IV hydration  Dispo: The patient is from: Home              Anticipated d/c  is to: Home              Patient currently is not medically stable to d/c.   Difficult to place patient No       Infusions:  . sodium chloride      Scheduled Meds: . folic acid  1 mg Oral Daily  . insulin aspart  0-9 Units Subcutaneous TID WC  . multivitamin with minerals  1 tablet Oral Daily  . pantoprazole (PROTONIX) IV  40 mg Intravenous Q12H  . phosphorus  500 mg Oral BID  . thiamine  100 mg Oral Daily   Or  .  thiamine  100 mg Intravenous Daily    Antimicrobials: Anti-infectives (From admission, onward)   None      PRN meds: hydrALAZINE, LORazepam **OR** LORazepam, morphine injection, ondansetron **OR** ondansetron (ZOFRAN) IV   Objective: Vitals:   02/13/21 0535 02/13/21 1403  BP: 127/75 120/75  Pulse: 68 63  Resp: 16 15  Temp: 97.8 F (36.6 C) 97.8 F (36.6 C)  SpO2: 100% 100%    Intake/Output Summary (Last 24 hours) at 02/13/2021 1654 Last data filed at 02/13/2021 1404 Gross per 24 hour  Intake 600 ml  Output 1310 ml  Net -710 ml   There were no vitals filed for this visit. Weight change:  There is no height or weight on file to calculate BMI.   Physical Exam: General exam: Pleasant young African-American male.  Not in distress Skin: No rashes, lesions or ulcers. HEENT: Atraumatic, normocephalic, no obvious bleeding Lungs: Clear to auscultation bilaterally CVS: Regular rate and rhythm, no murmur GI/Abd soft, nontender, nondistended, bowel sound present CNS: Sleeping, opens eyes on verbal command, falls right back asleep Psychiatry: Depressed look Extremities: No pedal edema, no calf tenderness  Data Review: I have personally reviewed the laboratory data and studies available.  Recent Labs  Lab 02/12/21 0803 02/12/21 1834 02/13/21 0545  WBC 7.7 8.6 6.3  NEUTROABS 5.7  --   --   HGB 14.9 11.9* 11.3*  HCT 41.5 33.5* 32.5*  MCV 86.5 88.4 89.5  PLT 522* 446* 398   Recent Labs  Lab 02/12/21 0803 02/12/21 2025 02/13/21 0545  NA 122* 124* 127*  K 3.4* 3.3* 3.3*  CL 82* 92* 96*  CO2 20* 21* 20*  GLUCOSE 223* 159* 134*  BUN 28* 24* 21*  CREATININE 2.04* 1.35* 1.14  CALCIUM 9.8 8.6* 8.6*  MG  --  1.8  --   PHOS  --  2.4*  --     F/u labs ordered Unresulted Labs (From admission, onward)          Start     Ordered   02/14/21 0500  Magnesium  Tomorrow morning,   STAT        02/13/21 1048   02/14/21 0500  Phosphorus  Tomorrow morning,   R         02/13/21 1048   02/13/21 0500  Basic metabolic panel  Daily,   R      02/12/21 1721   02/13/21 0500  CBC  Daily,   R      02/12/21 1721          Signed, Lorin Glass, MD [facility] 02/13/2021

## 2021-02-14 LAB — CBC
HCT: 31.5 % — ABNORMAL LOW (ref 39.0–52.0)
Hemoglobin: 10.6 g/dL — ABNORMAL LOW (ref 13.0–17.0)
MCH: 30.5 pg (ref 26.0–34.0)
MCHC: 33.7 g/dL (ref 30.0–36.0)
MCV: 90.5 fL (ref 80.0–100.0)
Platelets: 371 10*3/uL (ref 150–400)
RBC: 3.48 MIL/uL — ABNORMAL LOW (ref 4.22–5.81)
RDW: 13.6 % (ref 11.5–15.5)
WBC: 6.3 10*3/uL (ref 4.0–10.5)
nRBC: 0 % (ref 0.0–0.2)

## 2021-02-14 LAB — BASIC METABOLIC PANEL
Anion gap: 9 (ref 5–15)
BUN: 13 mg/dL (ref 6–20)
CO2: 20 mmol/L — ABNORMAL LOW (ref 22–32)
Calcium: 8.6 mg/dL — ABNORMAL LOW (ref 8.9–10.3)
Chloride: 101 mmol/L (ref 98–111)
Creatinine, Ser: 0.94 mg/dL (ref 0.61–1.24)
GFR, Estimated: >60 mL/min (ref >60)
Glucose, Bld: 115 mg/dL — ABNORMAL HIGH (ref 70–99)
Potassium: 3.3 mmol/L — ABNORMAL LOW (ref 3.5–5.1)
Sodium: 130 mmol/L — ABNORMAL LOW (ref 135–145)

## 2021-02-14 LAB — GLUCOSE, CAPILLARY: Glucose-Capillary: 116 mg/dL — ABNORMAL HIGH (ref 70–99)

## 2021-02-14 LAB — PHOSPHORUS: Phosphorus: 3.4 mg/dL (ref 2.5–4.6)

## 2021-02-14 LAB — MAGNESIUM: Magnesium: 1.6 mg/dL — ABNORMAL LOW (ref 1.7–2.4)

## 2021-02-14 MED ORDER — POTASSIUM CHLORIDE CRYS ER 20 MEQ PO TBCR
40.0000 meq | EXTENDED_RELEASE_TABLET | Freq: Once | ORAL | Status: AC
  Administered 2021-02-14: 40 meq via ORAL
  Filled 2021-02-14: qty 2

## 2021-02-14 MED ORDER — PANTOPRAZOLE SODIUM 40 MG PO TBEC: 40.0000 mg | DELAYED_RELEASE_TABLET | Freq: Every day | ORAL | 2 refills | Status: DC

## 2021-02-14 MED ORDER — MAGNESIUM OXIDE 400 (241.3 MG) MG PO TABS
800.0000 mg | ORAL_TABLET | Freq: Once | ORAL | Status: AC
  Administered 2021-02-14: 800 mg via ORAL
  Filled 2021-02-14: qty 2

## 2021-02-14 MED ORDER — PANTOPRAZOLE SODIUM 40 MG PO TBEC
40.0000 mg | DELAYED_RELEASE_TABLET | Freq: Two times a day (BID) | ORAL | Status: DC
  Administered 2021-02-14: 40 mg via ORAL
  Filled 2021-02-14: qty 1

## 2021-02-14 MED ORDER — MAGNESIUM SULFATE 2 GM/50ML IV SOLN: 2.0000 g | Freq: Once | INTRAVENOUS | Status: DC

## 2021-02-14 NOTE — Discharge Summary (Addendum)
Physician Discharge Summary  TAN CLOPPER ZDG:[id number] DOB: 18-Mar-1976 DOA: 02/12/2021  PCP: [name], NP  Admit date: 02/12/2021 Discharge date: 02/14/2021  Admitted From: Home Discharge disposition: Home   Code Status: Full Code  Diet Recommendation: Regular diet  Discharge Diagnosis:   Principal Problem:   Intractable nausea and vomiting Active Problems:   Alcohol use   Hypokalemia   Hyponatremia   AKI (acute kidney injury) [facility])   Chief Complaint  Patient presents with  . Emesis    Brief narrative: Richard Lopez is a 45 y.o. male with PMH significant for depression, chronic alcohol abuse, history of delirium tremens, alcoholic gastritis, HTN, diet-controlled diabetes, paroxysmal A. fib not on anticoagulation, history of right hip fracture and subsequent provoked DVT/PE in 2020. Patient was brought to the ED from home on 4/12 with complaint of progressively worsening abdominal pain as well as multiple episodes of vomiting, sometimes with blood, poor oral intake for last 2 weeks.   Reported last drink was a week prior to presentation after which she has some hallucinations that resolved by the time of presentation.  In the ED patient was afebrile, hemodynamically stable. Labs abnormal with sodium low at 122, potassium low at 3.4, glucose elevated to 223, creatinine elevated to 2.04, Patient was admitted to hospitalist service See below for details.  Subjective: Patient was seen and examined this morning.   Sitting up in bed.  Not in distress.  Tolerating regular diet.  Assessment/Plan: Intractable Nausea and vomiting with reported hematemesis History of eosinophilic esophagitis -Patient probably had alcoholic gastritis and pancreatitis which seems to be resolving now.   -Lipase level was only slightly elevated.   -Done symptomatic management in the hospital with IV fluid, symptomatic management with IV Protonix, IV Zofran. -Tolerating regular diet. -EGD  in October 2020 showed reflux esophagitis, gastritis and esophageal mucosal changes suspicious for eosinophilic esophagitis. -Discharged on Protonix, as needed Zofran.  Chronic alcohol abuse History of hospitalizations for alcohol withdrawal symptoms -Counseled to stop alcohol. -No withdrawal symptoms in the hospital.  AKI -Due to dehydration.  Improved with IV fluid. Recent Labs    10/22/20 1105 10/23/20 0528 10/23/20 1359 10/24/20 0748 02/01/21 1407 02/03/21 1420 02/12/21 0803 02/12/21 2025 02/13/21 0545 02/14/21 0539  BUN 23* 26* 28* 24* 21* 13  CREATININE 2.59* 1.36* 1.18 0.95 1.58* 1.83* 2.04* 1.35* 1.14 0.94   Hyponatremia -Gradually improving with IV fluid.  Encourage oral hydration Recent Labs  Lab 02/12/21 0803 02/12/21 2025 02/13/21 0545 02/14/21 0539  NA 122* 124* 127* 130*   Hypokalemia/hypophosphatemia/hypomagnesemia -Low level of electrolytes because of poor oral intake, nausea vomiting.  Replacements given today as well. Recent Labs  Lab 02/12/21 0803 02/12/21 2025 02/13/21 0545 02/14/21 0539  K 3.4* 3.3* 3.3* 3.3*  MG  --  1.8  --  1.6*  PHOS  --  2.4*  --  3.4   Hyperglycemia -Prediabetes with A1c 6.1. -Dietary control recommended  Recent Labs  Lab 02/13/21 0809 02/13/21 1205 02/13/21 1813 02/13/21 2214 02/14/21 0754  GLUCAP 125* 137* 110* 189* 116*   Severe tophaceous gout -Patient has history of gout with multiple tophaceous lesions in multiple places with deformed small joints of his hands.  He says he cannot take allopurinol because of nausea.  History of DVT/PE -Provoked after a fracture.  Was on Eliquis.  Currently not on anticoagulation.  Stable for discharge to home today.  Wound care:    Discharge Exam:   Vitals:  02/13/21 2036 02/14/21 0511 02/14/21 0642 02/14/21 0758  BP: (!) 144/75 128/76    Pulse: 70 61    Resp: 18 18    Temp: 98.4 F (36.9 C) 98.3 F (36.8 C)    TempSrc: Oral Oral    SpO2:  100% 99%    Weight:   97.4 kg   Height:    6\' 6"  (1.981 m)    Body mass index is 24.81 kg/m.  General exam: Not in distress Skin: Multiple sites with tophaceous gout. HEENT: Atraumatic, normocephalic, no obvious bleeding Lungs: Clear to auscultation bilaterally CVS: Regular rate and rhythm, no murmur GI/Abd soft, nontender, nondistended, bowel sound present CNS: Alert, awake, oriented x3 Psychiatry: Mood appropriate Extremities: No pedal edema, no calf tenderness.  Deformed small joints of his hands with tophaceous gout  Follow ups:   Discharge Instructions    Diet general   Complete by: As directed    Increase activity slowly   Complete by: As directed       Follow-up Information    , NP Follow up.   Specialty: Internal Medicine Contact information: [name] [place] [phone]        [email or web address] Follow up.   Why: look up AA meetings in the area.              Recommendations for Outpatient Follow-Up:   1. Follow-up with PCP as an outpatient  Discharge Instructions:  Follow with Primary MD [phone], NP in 7 days   Get CBC/BMP checked in next visit within 1 week by PCP or SNF MD ( we routinely change or add medications that can affect your baseline labs and fluid status, therefore we recommend that you get the mentioned basic workup next visit with your PCP, your PCP may decide not to get them or add new tests based on their clinical decision)  On your next visit with your PCP, please Get Medicines reviewed and adjusted.  Please request your PCP  to go over all Hospital Tests and Procedure/Radiological results at the follow up, please get all Hospital records sent to your Prim MD by signing hospital release before you go home.  Activity: As tolerated with Full fall precautions use walker/cane & assistance as needed  For Heart failure patients - Check your Weight same time everyday, if you gain over 2 pounds,  or you develop in leg swelling, experience more shortness of breath or chest pain, call your Primary MD immediately. Follow Cardiac Low Salt Diet and 1.5 lit/day fluid restriction.  If you have smoked or chewed Tobacco in the last 2 yrs please stop smoking, stop any regular Alcohol  and or any Recreational drug use.  If you experience worsening of your admission symptoms, develop shortness of breath, life threatening emergency, suicidal or homicidal thoughts you must seek medical attention immediately by calling 911 or calling your MD immediately  if symptoms less severe.  You Must read complete instructions/literature along with all the possible adverse reactions/side effects for all the Medicines you take and that have been prescribed to you. Take any new Medicines after you have completely understood and accpet all the possible adverse reactions/side effects.   Do not drive, operate heavy machinery, perform activities at heights, swimming or participation in water activities or provide baby sitting services if your were admitted for syncope or siezures until you have seen by Primary MD or a Neurologist and advised to do so again.  Do not drive when  taking Pain medications.  Do not take more than prescribed Pain, Sleep and Anxiety Medications  Wear Seat belts while driving.   Please note You were cared for by a hospitalist during your hospital stay. If you have any questions about your discharge medications or the care you received while you were in the hospital after you are discharged, you can call the unit and asked to speak with the hospitalist on call if the hospitalist that took care of you is not available. Once you are discharged, your primary care physician will handle any further medical issues. Please note that NO REFILLS for any discharge medications will be authorized once you are discharged, as it is imperative that you return to your primary care physician (or establish a  relationship with a primary care physician if you do not have one) for your aftercare needs so that they can reassess your need for medications and monitor your lab values.    Allergies as of 02/14/2021   No Known Allergies     Medication List    STOP taking these medications   azithromycin 250 MG tablet Commonly known as: ZITHROMAX   fluticasone 50 MCG/ACT nasal spray Commonly known as: FLONASE   loratadine 10 MG tablet Commonly known as: CLARITIN   Metoprolol Tartrate 75 MG Tabs     TAKE these medications   pantoprazole 40 MG tablet Commonly known as: PROTONIX Take 1 tablet (40 mg total) by mouth daily. What changed:   medication strength  how much to take       Time coordinating discharge: 35 minutes  The results of significant diagnostics from this hospitalization (including imaging, microbiology, ancillary and laboratory) are listed below for reference.    Procedures and Diagnostic Studies:   No results found.   Labs:   Basic Metabolic Panel: Recent Labs  Lab 02/12/21 0803 02/12/21 2025 02/13/21 0545 02/14/21 0539  NA 122* 124* 127* 130*  K 3.4* 3.3* 3.3* 3.3*  CL 82* 92* 96* 101  CO2 20* 21* 20* 20*  GLUCOSE 223* 159* 134* 115*  BUN 28* 24* 21* 13  CREATININE 2.04* 1.35* 1.14 0.94  CALCIUM 9.8 8.6* 8.6* 8.6*  MG  --  1.8  --  1.6*  PHOS  --  2.4*  --  3.4   GFR Estimated Creatinine Clearance: 131 mL/min (by C-G formula based on SCr of 0.94 mg/dL). Liver Function Tests: Recent Labs  Lab 02/12/21 0803 02/12/21 2025  AST 54* 35  ALT 40 29  ALKPHOS 90 66  BILITOT 0.8 0.7  PROT 10.2* 7.6  ALBUMIN 4.8 3.7   Recent Labs  Lab 02/12/21 0803 02/13/21 0545  LIPASE 60* 56*   No results for input(s): AMMONIA in the last 168 hours. Coagulation profile No results for input(s): INR, PROTIME in the last 168 hours.  CBC: Recent Labs  Lab 02/12/21 0803 02/12/21 1834 02/13/21 0545 02/14/21 0539  WBC 7.7 8.6 6.3 6.3  NEUTROABS 5.7  --    --   --   HGB 14.9 11.9* 11.3* 10.6*  HCT 41.5 33.5* 32.5* 31.5*  MCV 86.5 88.4 89.5 90.5  PLT 522* 446* 398 371   Cardiac Enzymes: No results for input(s): CKTOTAL, CKMB, CKMBINDEX, TROPONINI in the last 168 hours. BNP: Invalid input(s): POCBNP CBG: Recent Labs  Lab 02/13/21 0809 02/13/21 1205 02/13/21 1813 02/13/21 2214 02/14/21 0754  GLUCAP 125* 137* 110* 189* 116*   D-Dimer No results for input(s): DDIMER in the last 72 hours. Hgb A1c Recent Labs  02/12/21 1834  HGBA1C 6.1*   Lipid Profile No results for input(s): CHOL, HDL, LDLCALC, TRIG, CHOLHDL, LDLDIRECT in the last 72 hours. Thyroid function studies No results for input(s): TSH, T4TOTAL, T3FREE, THYROIDAB in the last 72 hours.  Invalid input(s): FREET3 Anemia work up No results for input(s): VITAMINB12, FOLATE, FERRITIN, TIBC, IRON, RETICCTPCT in the last 72 hours. Microbiology Recent Results (from the past 240 hour(s))  Resp Panel by RT-PCR (Flu A&B, Covid) Nasopharyngeal Swab     Status: None   Collection Time: 02/12/21  2:29 PM   Specimen: Nasopharyngeal Swab; Nasopharyngeal(NP) swabs in vial transport medium  Result Value Ref Range Status   SARS Coronavirus 2 by RT PCR NEGATIVE NEGATIVE Final    Comment: (NOTE) SARS-CoV-2 target nucleic acids are NOT DETECTED.  The SARS-CoV-2 RNA is generally detectable in upper respiratory specimens during the acute phase of infection. The lowest concentration of SARS-CoV-2 viral copies this assay can detect is 138 copies/mL. A negative result does not preclude SARS-Cov-2 infection and should not be used as the sole basis for treatment or other patient management decisions. A negative result may occur with  improper specimen collection/handling, submission of specimen other than nasopharyngeal swab, presence of viral mutation(s) within the areas targeted by this assay, and inadequate number of viral copies(<138 copies/mL). A negative result must be combined  with clinical observations, patient history, and epidemiological information. The expected result is Negative.  Fact Sheet for Patients:  BloggerCourse.com  Fact Sheet for Healthcare Providers:  SeriousBroker.it  This test is no t yet approved or cleared by the [place] FDA and  has been authorized for detection and/or diagnosis of SARS-CoV-2 by FDA under an Emergency Use Authorization (EUA). This EUA will remain  in effect (meaning this test can be used) for the duration of the COVID-19 declaration under Section 564(b)(1) of the Act, 21 U.S.C.section 360bbb-3(b)(1), unless the authorization is terminated  or revoked sooner.       Influenza A by PCR NEGATIVE NEGATIVE Final   Influenza B by PCR NEGATIVE NEGATIVE Final    Comment: (NOTE) The Xpert Xpress SARS-CoV-2/FLU/RSV plus assay is intended as an aid in the diagnosis of influenza from Nasopharyngeal swab specimens and should not be used as a sole basis for treatment. Nasal washings and aspirates are unacceptable for Xpert Xpress SARS-CoV-2/FLU/RSV testing.  Fact Sheet for Patients: BloggerCourse.com  Fact Sheet for Healthcare Providers: SeriousBroker.it  This test is not yet approved or cleared by the [place] FDA and has been authorized for detection and/or diagnosis of SARS-CoV-2 by FDA under an Emergency Use Authorization (EUA). This EUA will remain in effect (meaning this test can be used) for the duration of the COVID-19 declaration under Section 564(b)(1) of the Act, 21 U.S.C. section 360bbb-3(b)(1), unless the authorization is terminated or revoked.  Performed at [facility], [address] [place], [place] [address]      Signed: Melina Schools Awab Abebe  [facility] 02/14/2021, 12:43 PM

## 2021-02-14 NOTE — TOC Transition Note (Signed)
Transition of Care [facility]) - CM/SW Discharge Note   Patient Details  Name: RAYMOND BHARDWAJ MRN: [id number] Date of Birth: Apr 13, 1976  Transition of Care [facility]) CM/SW Contact:  Bartholome Bill, RN Phone Number: 02/14/2021, 11:41 AM   Clinical Narrative:    Resources for alcohol placed on AVS.     Readmission Risk Interventions Readmission Risk Prevention Plan 05/19/2019  Transportation Screening Complete  Medication Review (RN Care Manager) Complete  PCP or Specialist appointment within 3-5 days of discharge Complete  HRI or Home Care Consult Complete  SW Recovery Care/Counseling Consult Complete  Palliative Care Screening Not Applicable  Skilled Nursing Facility Not Applicable  Some recent data might be hidden

## 2021-02-18 ENCOUNTER — Telehealth: Payer: Self-pay

## 2021-02-18 NOTE — Telephone Encounter (Signed)
Transition Care Management Unsuccessful Follow-up Telephone Call  Date of discharge and from where:  02/14/2021, [facility]   Attempts:  1st Attempt  Reason for unsuccessful TCM follow-up call:  Unable to leave message, voicemail not set up # [phone].   Need to discuss scheduling a hospital follow up appointment with PCP at [facility].

## 2021-02-19 ENCOUNTER — Telehealth: Payer: Self-pay

## 2021-02-19 NOTE — Telephone Encounter (Signed)
Transition Care Management Follow-up Telephone Call  Date of discharge and from where: 02/14/2021, [facility]   How have you been since you were released from the hospital? He said he is feeling pretty good  Any questions or concerns? Yes - he is concerned about his medical bills. He said he has been denied disability and medicaid and a case worker was supposed to contact him but he has not heard from anyone and he does not have the caseworker's name.   This CM explained that he can apply for [facility] Financial Assistance /Orange Card/ Blue Card for some assistnace paying medical bills and medication costs if he qualifies.  He said he has not applied for CFA yet and requested this CM send him an application. Explained to him that once he has all of the information for the application, he can contact [facility] to schedule an appointment to submit the application.  - this CM sent the application to him as requested. He did not want to schedule follow up with PCP until this application is completed   He as upset that he had to go to the hospital 3 times recently to have his medical problems addressed.   He stated that he thinks his hip is still not right since surgery and he will need to follow up with that provider   Items Reviewed:  Did the pt receive and understand the discharge instructions provided? Yes   Medications obtained and verified? No  - he said he cannot afford the protonix even with the discount card that the hospital gave him. He said he still takes prilosec and vitamins for osteoporosis.   Other? No   Any new allergies since your discharge? No   Do you have support at home? he has been taking care of his mother who recently was discharged from the hospital   Home Care and Equipment/Supplies: Were home health services ordered? no If so, what is the name of the agency? n/a  Has the agency set up a time to come to the patient's home? not  applicable Were any new equipment or medical supplies ordered?  No What is the name of the medical supply agency? n/a Were you able to get the supplies/equipment? not applicable Do you have any questions related to the use of the equipment or supplies? No  Functional Questionnaire: (I = Independent and D = Dependent) ADLs: independent, has cane to use with ambulation when needed.   Follow up appointments reviewed:   PCP Hospital f/u appt confirmed? No  - he said he will call to schedule an appointment.  He wants to complete the [facility] Financial assistance application first.    Specialist Hospital f/u appt confirmed? No , none scheduled    Are transportation arrangements needed? sometimes he may need assistance scheduling a ride to appointments   If their condition worsens, is the pt aware to call PCP or go to the Emergency Dept.? Yes  Was the patient provided with contact information for the PCP's office or ED? Yes  Was to pt encouraged to call back with questions or concerns? Yes

## 2021-04-27 ENCOUNTER — Observation Stay (HOSPITAL_COMMUNITY): Payer: Self-pay

## 2021-04-27 ENCOUNTER — Emergency Department (HOSPITAL_COMMUNITY): Payer: Self-pay

## 2021-04-27 ENCOUNTER — Inpatient Hospital Stay (HOSPITAL_COMMUNITY)
Admission: EM | Admit: 2021-04-27 | Discharge: 2021-04-29 | DRG: 641 | Disposition: A | Discharge status: Home / Self Care | Payer: Self-pay | Attending: Family Medicine | Admitting: Family Medicine

## 2021-04-27 ENCOUNTER — Other Ambulatory Visit: Payer: Self-pay

## 2021-04-27 ENCOUNTER — Encounter (HOSPITAL_COMMUNITY): Payer: Self-pay

## 2021-04-27 DIAGNOSIS — Z79899 Other long term (current) drug therapy: Secondary | ICD-10-CM

## 2021-04-27 DIAGNOSIS — N179 Acute kidney failure, unspecified: Secondary | ICD-10-CM | POA: Diagnosis present

## 2021-04-27 DIAGNOSIS — E871 Hypo-osmolality and hyponatremia: Secondary | ICD-10-CM | POA: Diagnosis present

## 2021-04-27 DIAGNOSIS — I10 Essential (primary) hypertension: Secondary | ICD-10-CM | POA: Diagnosis present

## 2021-04-27 DIAGNOSIS — Z20822 Contact with and (suspected) exposure to covid-19: Secondary | ICD-10-CM | POA: Diagnosis present

## 2021-04-27 DIAGNOSIS — Z87891 Personal history of nicotine dependence: Secondary | ICD-10-CM

## 2021-04-27 DIAGNOSIS — F1210 Cannabis abuse, uncomplicated: Secondary | ICD-10-CM | POA: Diagnosis present

## 2021-04-27 DIAGNOSIS — Z86711 Personal history of pulmonary embolism: Secondary | ICD-10-CM

## 2021-04-27 DIAGNOSIS — F32A Depression, unspecified: Secondary | ICD-10-CM | POA: Diagnosis present

## 2021-04-27 DIAGNOSIS — Z8249 Family history of ischemic heart disease and other diseases of the circulatory system: Secondary | ICD-10-CM

## 2021-04-27 DIAGNOSIS — F419 Anxiety disorder, unspecified: Secondary | ICD-10-CM | POA: Diagnosis present

## 2021-04-27 DIAGNOSIS — Z833 Family history of diabetes mellitus: Secondary | ICD-10-CM

## 2021-04-27 DIAGNOSIS — E860 Dehydration: Principal | ICD-10-CM | POA: Diagnosis present

## 2021-04-27 DIAGNOSIS — E876 Hypokalemia: Secondary | ICD-10-CM | POA: Diagnosis present

## 2021-04-27 DIAGNOSIS — R112 Nausea with vomiting, unspecified: Secondary | ICD-10-CM

## 2021-04-27 DIAGNOSIS — E1165 Type 2 diabetes mellitus with hyperglycemia: Secondary | ICD-10-CM | POA: Diagnosis present

## 2021-04-27 LAB — COMPREHENSIVE METABOLIC PANEL
ALT: 42 U/L (ref 0–44)
AST: 75 U/L — ABNORMAL HIGH (ref 15–41)
Albumin: 4.6 g/dL (ref 3.5–5.0)
Alkaline Phosphatase: 94 U/L (ref 38–126)
Anion gap: 20 — ABNORMAL HIGH (ref 5–15)
BUN: 23 mg/dL — ABNORMAL HIGH (ref 6–20)
CO2: 21 mmol/L — ABNORMAL LOW (ref 22–32)
Calcium: 9.4 mg/dL (ref 8.9–10.3)
Chloride: 88 mmol/L — ABNORMAL LOW (ref 98–111)
Creatinine, Ser: 2.64 mg/dL — ABNORMAL HIGH (ref 0.61–1.24)
GFR, Estimated: 30 mL/min — ABNORMAL LOW (ref >60)
Glucose, Bld: 193 mg/dL — ABNORMAL HIGH (ref 70–99)
Potassium: 4.1 mmol/L (ref 3.5–5.1)
Sodium: 129 mmol/L — ABNORMAL LOW (ref 135–145)
Total Bilirubin: 1.2 mg/dL (ref 0.3–1.2)
Total Protein: 9.2 g/dL — ABNORMAL HIGH (ref 6.5–8.1)

## 2021-04-27 LAB — URINALYSIS, ROUTINE W REFLEX MICROSCOPIC
Bilirubin Urine: NEGATIVE
Glucose, UA: NEGATIVE mg/dL
Hgb urine dipstick: NEGATIVE
Ketones, ur: 5 mg/dL — AB
Leukocytes,Ua: NEGATIVE
Nitrite: NEGATIVE
Protein, ur: 30 mg/dL — AB
Specific Gravity, Urine: 1.019 (ref 1.005–1.030)
pH: 5 (ref 5.0–8.0)

## 2021-04-27 LAB — RAPID URINE DRUG SCREEN, HOSP PERFORMED
Amphetamines: NOT DETECTED
Barbiturates: NOT DETECTED
Benzodiazepines: NOT DETECTED
Cocaine: NOT DETECTED
Opiates: NOT DETECTED
Tetrahydrocannabinol: POSITIVE — AB

## 2021-04-27 LAB — CBC WITH DIFFERENTIAL/PLATELET
Abs Immature Granulocytes: 0.01 10*3/uL (ref 0.00–0.07)
Basophils Absolute: 0 10*3/uL (ref 0.0–0.1)
Basophils Relative: 0 %
Eosinophils Absolute: 0 10*3/uL (ref 0.0–0.5)
Eosinophils Relative: 0 %
HCT: 38.2 % — ABNORMAL LOW (ref 39.0–52.0)
Hemoglobin: 13.4 g/dL (ref 13.0–17.0)
Immature Granulocytes: 0 %
Lymphocytes Relative: 19 %
Lymphs Abs: 1 10*3/uL (ref 0.7–4.0)
MCH: 31.5 pg (ref 26.0–34.0)
MCHC: 35.1 g/dL (ref 30.0–36.0)
MCV: 89.9 fL (ref 80.0–100.0)
Monocytes Absolute: 0.9 10*3/uL (ref 0.1–1.0)
Monocytes Relative: 17 %
Neutro Abs: 3.3 10*3/uL (ref 1.7–7.7)
Neutrophils Relative %: 64 %
Platelets: 153 10*3/uL (ref 150–400)
RBC: 4.25 MIL/uL (ref 4.22–5.81)
RDW: 14.1 % (ref 11.5–15.5)
WBC: 5.2 10*3/uL (ref 4.0–10.5)
nRBC: 0 % (ref 0.0–0.2)

## 2021-04-27 LAB — LIPASE, BLOOD: Lipase: 34 U/L (ref 11–51)

## 2021-04-27 LAB — MAGNESIUM: Magnesium: 2.3 mg/dL (ref 1.7–2.4)

## 2021-04-27 LAB — ETHANOL: Alcohol, Ethyl (B): <10 mg/dL (ref <10)

## 2021-04-27 MED ORDER — ONDANSETRON HCL 4 MG PO TABS: 4.0000 mg | ORAL_TABLET | Freq: Four times a day (QID) | ORAL | Status: DC | PRN

## 2021-04-27 MED ORDER — SODIUM CHLORIDE 0.9 % IV BOLUS
1000.0000 mL | Freq: Once | INTRAVENOUS | Status: AC
  Administered 2021-04-27: 1000 mL via INTRAVENOUS

## 2021-04-27 MED ORDER — ONDANSETRON HCL 4 MG/2ML IJ SOLN
4.0000 mg | Freq: Four times a day (QID) | INTRAMUSCULAR | Status: DC | PRN
  Administered 2021-04-27 – 2021-04-28 (×2): 4 mg via INTRAVENOUS
  Filled 2021-04-27: qty 2

## 2021-04-27 MED ORDER — PANTOPRAZOLE SODIUM 40 MG PO TBEC
40.0000 mg | DELAYED_RELEASE_TABLET | Freq: Every day | ORAL | Status: DC
  Administered 2021-04-27 – 2021-04-29 (×3): 40 mg via ORAL
  Filled 2021-04-27 (×3): qty 1

## 2021-04-27 MED ORDER — HYDRALAZINE HCL 20 MG/ML IJ SOLN
10.0000 mg | Freq: Three times a day (TID) | INTRAMUSCULAR | Status: DC | PRN
Start: 2021-04-27 — End: 2021-04-29

## 2021-04-27 MED ORDER — ONDANSETRON HCL 4 MG/2ML IJ SOLN
4.0000 mg | Freq: Once | INTRAMUSCULAR | Status: DC
  Filled 2021-04-27: qty 2

## 2021-04-27 MED ORDER — AMLODIPINE BESYLATE 10 MG PO TABS
10.0000 mg | ORAL_TABLET | Freq: Every day | ORAL | Status: DC
  Administered 2021-04-27 – 2021-04-29 (×3): 10 mg via ORAL
  Filled 2021-04-27 (×3): qty 1

## 2021-04-27 MED ORDER — SODIUM CHLORIDE 0.9 % IV SOLN
12.5000 mg | Freq: Four times a day (QID) | INTRAVENOUS | Status: DC | PRN
  Administered 2021-04-27: 12.5 mg via INTRAVENOUS
  Filled 2021-04-27: qty 12.5

## 2021-04-27 MED ORDER — SODIUM CHLORIDE 0.9 % IV SOLN: INTRAVENOUS | Status: DC

## 2021-04-27 MED ORDER — LORAZEPAM 2 MG/ML IJ SOLN
1.0000 mg | Freq: Once | INTRAMUSCULAR | Status: AC
  Administered 2021-04-27: 1 mg via INTRAVENOUS
  Filled 2021-04-27: qty 1

## 2021-04-27 NOTE — ED Notes (Addendum)
Patient said he is unable to stand because his legs are cramping. Unable to complete orthostatic vitals. Patient made aware we need a urine sample. Urinal at bedside.

## 2021-04-27 NOTE — H&P (Addendum)
History and Physical    Richard Lopez BDZ:[id number] DOB: Jan 21, 1976 DOA: 04/27/2021  PCP: [name], NP  Patient coming from: Home  Chief Complaint: nausea and weakness  HPI: Richard Lopez is a 45 y.o. male with medical history significant of HTN. Presenting with nausea and weakness. Very odd affect and difficult to get information of out him on his condition. However, he states that nausea and vomiting started 3 days ago. He was having difficulty keeping food down. His intake has been poor during that time. He tried prilosec, but it didn't help. When his symptoms didn't improve, he decided to come to the ED. He denies any other aggravating or alleviating factors.   ED Course: Found to have AKI. Given fluids and anti-emetics. TRH was called for admission.   Review of Systems: Review of systems is otherwise negative for all not mentioned in HPI.   PMHx Past Medical History:  Diagnosis Date   Anxiety    Arthritis    hands and possibly knee   Depression    Diabetes mellitus    diet controlled   Essential hypertension    Gastritis    Gout    Normocytic anemia due to blood loss 08/21/2019   Pulmonary embolism (HCC)     PSHx Past Surgical History:  Procedure Laterality Date   ESOPHAGOGASTRODUODENOSCOPY (EGD) WITH PROPOFOL N/A 08/14/2019   Procedure: ESOPHAGOGASTRODUODENOSCOPY (EGD) WITH PROPOFOL;  Surgeon: Charlott Rakes, MD;  Location: [facility];  Service: Endoscopy;  Laterality: N/A;   EXTERNAL FIXATION LEG Left 11/07/2018   Procedure: EXTERNAL FIXATION LEFT LOWER LEG;  Surgeon: Samson Frederic, MD;  Location: [facility];  Service: Orthopedics;  Laterality: Left;   EXTERNAL FIXATION REMOVAL Left 11/08/2018   Procedure: REMOVAL EXTERNAL FIXATION LEG;  Surgeon: Roby Lofts, MD;  Location: [facility];  Service: Orthopedics;  Laterality: Left;   INTRAMEDULLARY (IM) NAIL INTERTROCHANTERIC Right 03/26/2019   Procedure: INTRAMEDULLARY (IM) NAIL INTERTROCHANTRIC;  Surgeon: Roby Lofts, MD;  Location: [facility];  Service: Orthopedics;  Laterality: Right;   INTRAMEDULLARY (IM) NAIL INTERTROCHANTERIC Right 05/17/2019   Procedure: Intramedullary (Im) Nail Intertroch with circlage wiring;  Surgeon: Myrene Galas, MD;  Location: [facility];  Service: Orthopedics;  Laterality: Right;   NO PAST SURGERIES     OPEN REDUCTION INTERNAL FIXATION (ORIF) TIBIA/FIBULA FRACTURE Left 11/08/2018   Procedure: OPEN REDUCTION INTERNAL FIXATION (ORIF) TIBIA/FIBULA FRACTURE;  Surgeon: Roby Lofts, MD;  Location: [facility];  Service: Orthopedics;  Laterality: Left;   ORIF FEMUR FRACTURE Right 05/17/2019   Procedure: REMOVAL  OF HARDWARE;  Surgeon: Myrene Galas, MD;  Location: [facility];  Service: Orthopedics;  Laterality: Right;    SocHx  reports that he has quit smoking. His smoking use included cigarettes. He has never used smokeless tobacco. He reports current alcohol use of about 200.0 standard drinks of alcohol per week. He reports current drug use. Drug: Marijuana.  No Known Allergies  FamHx Family History  Problem Relation Age of Onset   Diabetes Mellitus II Father    Diabetes Mellitus II Other    CAD Other     Prior to Admission medications   Medication Sig Start Date End Date Taking? Authorizing Provider  pantoprazole (PROTONIX) 40 MG tablet Take 1 tablet (40 mg total) by mouth daily. 02/14/21 05/15/21  Lorin Glass, MD    Physical Exam: Vitals:   04/27/21 1400 04/27/21 1454 04/27/21 1455 04/27/21 1540  BP: (!) 153/99 (!) 162/97  (!) 154/99  Pulse: 87 85  84 84  Resp: 18 (!) Temp:      TempSrc:      SpO2: 100% 100% 100% 100%    General: 45 y.o. male resting in bed in NAD Eyes: PERRL, normal sclera ENMT: Nares patent w/o discharge, orophaynx clear, dentition normal, ears w/o discharge/lesions/ulcers Neck: Supple, trachea midline Cardiovascular: RRR, +S1, S2, no m/g/r, equal pulses throughout Respiratory: CTABL, no w/r/r, normal WOB GI: BS+, NDNT, no masses noted, no  organomegaly noted MSK: No e/c/c Skin: No rashes, bruises, ulcerations noted Neuro: A&O x 3, no focal deficits Psyc: Odd affect, calm, intermittently cooperative, mumbling to himself  Labs on Admission: I have personally reviewed following labs and imaging studies  CBC: Recent Labs  Lab 04/27/21 1202  WBC 5.2  NEUTROABS 3.3  HGB 13.4  HCT 38.2*  MCV 89.9  PLT 153   Basic Metabolic Panel: Recent Labs  Lab 04/27/21 1146 04/27/21 1202  NA  --  129*  K  --  4.1  CL  --  88*  CO2  --  21*  GLUCOSE  --  193*  BUN  --  23*  CREATININE  --  2.64*  CALCIUM  --  9.4  MG 2.3  --    GFR: CrCl cannot be calculated (Unknown ideal weight.). Liver Function Tests: Recent Labs  Lab 04/27/21 1202  AST 75*  ALT 42  ALKPHOS 94  BILITOT 1.2  PROT 9.2*  ALBUMIN 4.6   Recent Labs  Lab 04/27/21 1202  LIPASE 34   No results for input(s): AMMONIA in the last 168 hours. Coagulation Profile: No results for input(s): INR, PROTIME in the last 168 hours. Cardiac Enzymes: No results for input(s): CKTOTAL, CKMB, CKMBINDEX, TROPONINI in the last 168 hours. BNP (last 3 results) No results for input(s): PROBNP in the last 8760 hours. HbA1C: No results for input(s): HGBA1C in the last 72 hours. CBG: No results for input(s): GLUCAP in the last 168 hours. Lipid Profile: No results for input(s): CHOL, HDL, LDLCALC, TRIG, CHOLHDL, LDLDIRECT in the last 72 hours. Thyroid Function Tests: No results for input(s): TSH, T4TOTAL, FREET4, T3FREE, THYROIDAB in the last 72 hours. Anemia Panel: No results for input(s): VITAMINB12, FOLATE, FERRITIN, TIBC, IRON, RETICCTPCT in the last 72 hours. Urine analysis:    Component Value Date/Time   COLORURINE YELLOW 02/12/2021 1449   APPEARANCEUR HAZY (A) 02/12/2021 1449   LABSPEC 1.019 02/12/2021 1449   PHURINE 5.0 02/12/2021 1449   GLUCOSEU 50 (A) 02/12/2021 1449   HGBUR NEGATIVE 02/12/2021 1449   BILIRUBINUR NEGATIVE 02/12/2021 1449   KETONESUR  NEGATIVE 02/12/2021 1449   PROTEINUR 30 (A) 02/12/2021 1449   UROBILINOGEN 1.0 07/29/2014 1837   NITRITE NEGATIVE 02/12/2021 1449   LEUKOCYTESUR NEGATIVE 02/12/2021 1449    Radiological Exams on Admission: DG Chest Portable 1 View  Result Date: 04/27/2021 CLINICAL DATA:  Patient called EMS out saying he was in someone's home yesterday with no A/C and he was concerned he got overheated. Patient drank Gatorade and Pedialyte yesterday but he has not been able to keep fluids down. Patient reporting he has gallstones and may possibly be having his gallbladder out.; hx HTN, PE; smoker EXAM: PORTABLE CHEST 1 VIEW COMPARISON:  06/18/2020 FINDINGS: Normal heart, mediastinum and hila. Lungs are clear. No convincing pleural effusion and no pneumothorax. Skeletal structures are grossly intact. IMPRESSION: No active disease. Electronically Signed   By: Amie Portland M.D.   On: 04/27/2021 12:50    EKG: Independently reviewed.  Sinus, no st elevations  Assessment/Plan AKI     - place in obs, med-surg     - fluids, check renal US  N/V     - zofran, phenergan     - CLD for now  Marijuana abuse     - admitted to EDP that he uses mj     - counsel against further use     - check drug screen  HTN     - PRN hydralazine     - start norvasc  Hyperglycemia Hx of DM?     - system shows diet control DM history, but no meds noted     - check A1c  Hyponatremia     - labs show he's chronically low; he's at baseline; follow  DVT prophylaxis: SCDs  Code Status: FULL  Family Communication: None at bedside.   Consults called: None   Status is: Observation  The patient remains OBS appropriate and will d/c before 2 midnights.  Dispo: The patient is from: Home              Anticipated d/c is to: Home              Patient currently is not medically stable to d/c.   Difficult to place patient No  Time spent coordinating admission: 45 minutes  Angelie Kram A Xianna Siverling DO [facility]  If 7PM-7AM, please  contact night-coverage [email or web address]  04/27/2021, 3:56 PM

## 2021-04-27 NOTE — ED Triage Notes (Signed)
Patient BIB [facility] from home.  Patient called EMS out saying he was in someone's home yesterday with no A/C and he was concerned he got overheated.  Patient drank Gatorade and Pedialyte yesterday but he has not been able to keep fluids down.  Patient reporting he has gallstones and may possibly be having his gallbladder out.  EMS placed 20 g left hand, thy  gave 4 mg IV zofran and 300 ml NS.  Girlfriend worried patient might have passed out yesterday but patient denies.  Vitals were 180/120 90-HR 995 212-CBG

## 2021-04-27 NOTE — ED Provider Notes (Addendum)
[facility]-EMERGENCY DEPT Provider Note   CSN: [id number] Arrival date & time: 04/27/21  1111     History Chief Complaint  Patient presents with   Vomiting    Richard Lopez is a 45 y.o. male.  The history is provided by the patient and medical records. No language interpreter was used.   45 year old male significant history diabetes, polysubstance abuse, uncontrolled hypertension, brought here via EMS from home with complaints of nausea and vomiting.  Patient states that he was at someone's homes yesterday that does not have any AC and he felt that he may have got overheated.  He report he feels lightheadedness, hot as well as persistent nausea and vomiting and coughing.  He tries taking Gatorade and Pedialyte yesterday but does not seem to be able to keep anything down.  He did admits to marijuana use yesterday but denies any recent alcohol use.  He endorsed abdominal pain from vomiting.  Report feeling very uncomfortable.  EMS gave patient Zofran as well as 300 mL of normal saline on route.  He still endorse nausea.  History is difficult to obtain as patient is actively vomiting and appears uncomfortable.  Past Medical History:  Diagnosis Date   Anxiety    Arthritis    hands and possibly knee   Depression    Diabetes mellitus    diet controlled   Essential hypertension    Gastritis    Gout    Normocytic anemia due to blood loss 08/21/2019   Pulmonary embolism [facility])     Patient Active Problem List   Diagnosis Date Noted   Intractable nausea and vomiting 02/12/2021   AF (paroxysmal atrial fibrillation) (HCC)    Transaminitis    Dehydration    AKI (acute kidney injury) (HCC) 06/18/2020   Atrial fibrillation with RVR (HCC) 04/22/2020   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   Hyponatremia 04/21/2020   Aspiration pneumonia (HCC) 04/21/2020   Esophagitis determined by endoscopy 08/21/2019   Anemia 08/21/2019   Hypokalemia 08/17/2019   Hypomagnesemia  08/17/2019   Hemoptysis 08/17/2019   Acute upper GI bleed 08/14/2019   Hematemesis 08/13/2019   Cavitary lesion of lung 08/01/2019   Gout 05/18/2019   Peri-prosthetic fracture of femur at tip of prosthesis 05/16/2019   Alcohol use 05/16/2019   Intertrochanteric fracture of right hip (HCC) 04/14/2019   Vitamin D deficiency 04/14/2019   DTs (delirium tremens) (HCC) 04/07/2019   Delirium tremens (HCC) 04/07/2019   Encephalopathy acute    Closed right hip fracture, initial encounter (HCC) 03/25/2019   Alcohol dependence with intoxication (HCC) 03/25/2019   Hypertensive urgency 03/25/2019   Thrombocytopenia (HCC) 03/25/2019   Effusion, right knee 03/25/2019   Closed fracture of right femur (HCC)    High anion gap metabolic acidosis    Prediabetes 04/02/2017   Gastritis 04/02/2017   Marijuana abuse 04/01/2017   Nausea with vomiting 07/29/2014   Type 2 diabetes mellitus with hyperlipidemia (HCC) 07/29/2014   Essential hypertension 07/29/2014   Nausea & vomiting 07/29/2014    Past Surgical History:  Procedure Laterality Date   ESOPHAGOGASTRODUODENOSCOPY (EGD) WITH PROPOFOL N/A 08/14/2019   Procedure: ESOPHAGOGASTRODUODENOSCOPY (EGD) WITH PROPOFOL;  Surgeon: Charlott Rakes, MD;  Location: [facility];  Service: Endoscopy;  Laterality: N/A;   EXTERNAL FIXATION LEG Left 11/07/2018   Procedure: EXTERNAL FIXATION LEFT LOWER LEG;  Surgeon: Samson Frederic, MD;  Location: [facility];  Service: Orthopedics;  Laterality: Left;   EXTERNAL FIXATION REMOVAL Left 11/08/2018   Procedure: REMOVAL EXTERNAL  FIXATION LEG;  Surgeon: Roby Lofts, MD;  Location: [facility];  Service: Orthopedics;  Laterality: Left;   INTRAMEDULLARY (IM) NAIL INTERTROCHANTERIC Right 03/26/2019   Procedure: INTRAMEDULLARY (IM) NAIL INTERTROCHANTRIC;  Surgeon: Roby Lofts, MD;  Location: [facility];  Service: Orthopedics;  Laterality: Right;   INTRAMEDULLARY (IM) NAIL INTERTROCHANTERIC Right 05/17/2019   Procedure: Intramedullary  (Im) Nail Intertroch with circlage wiring;  Surgeon: Myrene Galas, MD;  Location: [facility];  Service: Orthopedics;  Laterality: Right;   NO PAST SURGERIES     OPEN REDUCTION INTERNAL FIXATION (ORIF) TIBIA/FIBULA FRACTURE Left 11/08/2018   Procedure: OPEN REDUCTION INTERNAL FIXATION (ORIF) TIBIA/FIBULA FRACTURE;  Surgeon: Roby Lofts, MD;  [facility]: [facility];  Service: Orthopedics;  Laterality: Left;   ORIF FEMUR FRACTURE Right 05/17/2019   Procedure: REMOVAL  OF HARDWARE;  Surgeon: Myrene Galas, MD;  Location: [facility];  Service: Orthopedics;  Laterality: Right;       Family History  Problem Relation Age of Onset   Diabetes Mellitus II Father    Diabetes Mellitus II Other    CAD Other     Social History   Tobacco Use   Smoking status: Former    Packs/day: 0.00    Pack years: 0.00    Types: Cigarettes   Smokeless tobacco: Never   Tobacco comments:    About 1 cigarette per day or less  Vaping Use   Vaping Use: Never used  Substance Use Topics   Alcohol use: Yes    Alcohol/week: 200.0 standard drinks    Types: 200 Cans of beer per week   Drug use: Yes    Types: Marijuana    Home Medications Prior to Admission medications   Medication Sig Start Date End Date Taking? Authorizing Provider  pantoprazole (PROTONIX) 40 MG tablet Take 1 tablet (40 mg total) by mouth daily. 02/14/21 05/15/21  Lorin Glass, MD    Allergies    Patient has no known allergies.  Review of Systems   Review of Systems  All other systems reviewed and are negative.  Physical Exam Updated Vital Signs BP 98/67   Pulse 94   Temp 97.8 F (36.6 C) (Oral)   Resp 16   SpO2 100%   Physical Exam Vitals and nursing note reviewed.  Constitutional:      Appearance: He is well-developed. He is diaphoretic.     Comments: Rolling in bed, actively vomiting and coughing appears very uncomfortable  HENT:     Head: Atraumatic.  Eyes:     Conjunctiva/sclera: Conjunctivae normal.  Cardiovascular:     Rate and  Rhythm: Normal rate and regular rhythm.     Pulses: Normal pulses.     Heart sounds: Normal heart sounds.  Pulmonary:     Effort: Pulmonary effort is normal.     Breath sounds: Normal breath sounds. No wheezing, rhonchi or rales.  Abdominal:     Palpations: Abdomen is soft.  Musculoskeletal:     Cervical back: Neck supple.  Skin:    General: Skin is warm.     Findings: No rash.  Neurological:     Mental Status: He is alert. Mental status is at baseline.    ED Results / Procedures / Treatments   Labs (all labs ordered are listed, but only abnormal results are displayed) Labs Reviewed  CBC WITH DIFFERENTIAL/PLATELET - Abnormal; Notable for the following components:      Result Value   HCT 38.2 (*)    All other components within normal  limits  COMPREHENSIVE METABOLIC PANEL - Abnormal; Notable for the following components:   Sodium 129 (*)    Chloride 88 (*)    CO2 21 (*)    Glucose, Bld 193 (*)    BUN 23 (*)    Creatinine, Ser 2.64 (*)    Total Protein 9.2 (*)    AST 75 (*)    GFR, Estimated 30 (*)    Anion gap 20 (*)    All other components within normal limits  SARS CORONAVIRUS 2 (TAT 6-24 HRS)  LIPASE, BLOOD  ETHANOL  MAGNESIUM  URINALYSIS, ROUTINE W REFLEX MICROSCOPIC  RAPID URINE DRUG SCREEN, HOSP PERFORMED    EKG EKG Interpretation  Date/Time:  Saturday April 27 2021 11:53:20 EDT Ventricular Rate:  89 PR Interval:  147 QRS Duration: 100 QT Interval:  390 QTC Calculation: 475 R Axis:   62 Text Interpretation: Sinus rhythm Prominent P waves, nondiagnostic Anteroseptal infarct, old Confirmed by Lorre Nick ([id number]) on 04/27/2021 3:04:12 PM  Radiology DG Chest Portable 1 View  Result Date: 04/27/2021 CLINICAL DATA:  Patient called EMS out saying he was in someone's home yesterday with no A/C and he was concerned he got overheated. Patient drank Gatorade and Pedialyte yesterday but he has not been able to keep fluids down. Patient reporting he has gallstones  and may possibly be having his gallbladder out.; hx HTN, PE; smoker EXAM: PORTABLE CHEST 1 VIEW COMPARISON:  06/18/2020 FINDINGS: Normal heart, mediastinum and hila. Lungs are clear. No convincing pleural effusion and no pneumothorax. Skeletal structures are grossly intact. IMPRESSION: No active disease. Electronically Signed   By: Amie Portland M.D.   On: 04/27/2021 12:50    Procedures Procedures   Medications Ordered in ED Medications  promethazine (PHENERGAN) 12.5 mg in sodium chloride 0.9 % 50 mL IVPB ( Intravenous Infusion Verify 04/27/21 1513)  0.9 %  sodium chloride infusion ( Intravenous Stopped 04/27/21 1459)  ondansetron (ZOFRAN) injection 4 mg (has no administration in time range)  sodium chloride 0.9 % bolus 1,000 mL (0 mLs Intravenous Stopped 04/27/21 1454)  LORazepam (ATIVAN) injection 1 mg (1 mg Intravenous Given 04/27/21 1230)    ED Course  I have reviewed the triage vital signs and the nursing notes.  Pertinent labs & imaging results that were available during my care of the patient were reviewed by me and considered in my medical decision making (see chart for details).    MDM Rules/Calculators/A&P                          BP (!) 162/97   Pulse 84   Temp 97.8 F (36.6 C) (Oral)   Resp 18   SpO2 100%   Final Clinical Impression(s) / ED Diagnoses Final diagnoses:  AKI (acute kidney injury) (HCC)  Intractable vomiting with nausea, unspecified vomiting type    Rx / DC Orders ED Discharge Orders     None      11:50 AM Patient actively vomiting, coughing, appears uncomfortable, felt that he is overheated.  He did admits to marijuana use last use was yesterday.  His presentation is suggestive of cannabinoid hyperemesis syndrome.  Unfortunately last EKG demonstrated prolonged QT with a QTC greater than 500.  Will provide symptomatic treatment, check labs, and will monitor closely.  3:24 PM COVID test is currently pending, normal WBC, normal H&H, electrolyte  imbalance with low sodium of 129, low chloride of 88, CO2 of 21, and worsening renal function with  creatinine of 2.64.  GFR is 30.  Does have an elevated white anion gap of 20.  Normal lipase, normal magnesium level and normal alcohol level.  Chest x-ray without acute finding.  Patient received IV fluid as well as antiemetic, and now appears more comfortable.  However due to intractable nausea and vomiting and worsening renal function, will admit for further care.    3:32 PM Appreciate consultation from [facility], Dr. Ronaldo Miyamoto who agrees to admit patient for further care.   Fayrene Helper, PA-C 04/27/21 1539    Fayrene Helper, PA-C 04/27/21 1539    Lorre Nick, MD 04/30/21 1049

## 2021-04-28 DIAGNOSIS — N179 Acute kidney failure, unspecified: Secondary | ICD-10-CM

## 2021-04-28 LAB — BASIC METABOLIC PANEL
Anion gap: 11 (ref 5–15)
BUN: 22 mg/dL — ABNORMAL HIGH (ref 6–20)
CO2: 23 mmol/L (ref 22–32)
Calcium: 8.6 mg/dL — ABNORMAL LOW (ref 8.9–10.3)
Chloride: 94 mmol/L — ABNORMAL LOW (ref 98–111)
Creatinine, Ser: 1.15 mg/dL (ref 0.61–1.24)
GFR, Estimated: >60 mL/min (ref >60)
Glucose, Bld: 172 mg/dL — ABNORMAL HIGH (ref 70–99)
Potassium: 3 mmol/L — ABNORMAL LOW (ref 3.5–5.1)
Sodium: 128 mmol/L — ABNORMAL LOW (ref 135–145)

## 2021-04-28 LAB — SODIUM, URINE, RANDOM: Sodium, Ur: <10 mmol/L

## 2021-04-28 LAB — PHOSPHORUS: Phosphorus: 2.6 mg/dL (ref 2.5–4.6)

## 2021-04-28 LAB — MAGNESIUM: Magnesium: 2 mg/dL (ref 1.7–2.4)

## 2021-04-28 LAB — OSMOLALITY, URINE: Osmolality, Ur: 507 mOsm/kg (ref 300–900)

## 2021-04-28 LAB — SARS CORONAVIRUS 2 (TAT 6-24 HRS): SARS Coronavirus 2: NEGATIVE

## 2021-04-28 MED ORDER — POTASSIUM CHLORIDE 20 MEQ PO PACK
40.0000 meq | PACK | Freq: Once | ORAL | Status: AC
  Administered 2021-04-28: 40 meq via ORAL
  Filled 2021-04-28: qty 2

## 2021-04-28 MED ORDER — HYOSCYAMINE SULFATE 0.125 MG/5ML PO ELIX
0.2500 mg | ORAL_SOLUTION | Freq: Once | ORAL | Status: AC
  Administered 2021-04-28: 0.25 mg via ORAL
  Filled 2021-04-28: qty 10

## 2021-04-28 NOTE — Progress Notes (Addendum)
PROGRESS NOTE    Richard Lopez  ZOX:[id number] DOB: 01-20-1976 DOA: 04/27/2021 PCP: [name], NP   Brief Narrative:  This 45 years old male with PMH significant for HTN presented to the ED with complaints of generalized weakness and nausea.  Patient was unable to provide history at the time of presentation in the ED since he has very odd affect.  Patient reported nausea and vomiting for 3 days associated with generalized weakness.  He was unable to take food down.  He has tried Prilosec without any relief.  In the ED he is found to have acute kidney injury and admitted for observation.  Assessment & Plan:   Active Problems:   AKI (acute kidney injury) (HCC)  Acute kidney injury could be secondary to dehydration.: Patient reports nausea and vomiting for last 3 days associated with generalized weakness. Baseline creatinine normal, presented with a serum creatinine of 2.64 Continue gentle IV hydration, avoid nephrotoxic medications, continue to monitor renal functions Serum creatinine normalized with IV hydration. Renal ultrasound unremarkable.  Nausea and vomiting: Continue Zofran and Phenergan  Hypokalemia: Replaced, continue to monitor.  Hyponatremia: Patient does have chronic hyponatremia, Baseline serum sodium remains 126-130s. Continue normal saline,  continue to monitor.  Marijuana abuse.: Urine drug screen positive for marijuana. Counseled against further use.  Hypertension: Start Norvasc.  Hyperglycemia: questionable history of diabetes mellitus, Obtain hemoglobin A1c.  DVT prophylaxis: Lovenox Code Status: Full code Family Communication: No family at bedside Disposition Plan:    Status is: Inpatient  Remains inpatient appropriate because:Inpatient level of care appropriate due to severity of illness  Dispo: The patient is from: Home              Anticipated d/c is to: Home              Patient currently is not medically stable to d/c.    Difficult to place patient No  Consultants:  None  Procedures:  None Antimicrobials:  Anti-infectives (From admission, onward)    None        Subjective: Patient was seen and examined at bedside.  Overnight events noted.   He reports feeling better but he still has some nausea and vomiting.  Objective: Vitals:   04/27/21 2050 04/28/21 0159 04/28/21 0610 04/28/21 0700  BP: (!) 148/87 (!) 166/97 129/83   Pulse: 71 88 72   Resp: 18 18 18    Temp: 97.9 F (36.6 C) 99 F (37.2 C) 98.2 F (36.8 C)   TempSrc: Oral Oral Oral   SpO2: 100% 100% 97%   Weight:    97.4 kg  Height:    6\' 6"  (1.981 m)    Intake/Output Summary (Last 24 hours) at 04/28/2021 1127 Last data filed at 04/28/2021 0746 Gross per 24 hour  Intake 3427.61 ml  Output 1400 ml  Net 2027.61 ml   Filed Weights   04/28/21 0700  Weight: 97.4 kg    Examination:  General exam: Appears calm and comfortable, not in any acute distress. Respiratory system: Clear to auscultation. Respiratory effort normal. Cardiovascular system: S1 & S2 heard, RRR. No JVD, murmurs, rubs, gallops or clicks. No pedal edema. Gastrointestinal system: Abdomen is nondistended, soft and nontender. No organomegaly or masses felt. Normal bowel sounds heard. Central nervous system: Alert and oriented. No focal neurological deficits. Extremities: Symmetric 5 x 5 power.  No edema, no cyanosis, no clubbing. Skin: No rashes, lesions or ulcers Psychiatry: Judgement and insight appear normal. Mood & affect appropriate.  Data Reviewed: I have personally reviewed following labs and imaging studies  CBC: Recent Labs  Lab 04/27/21 1202  WBC 5.2  NEUTROABS 3.3  HGB 13.4  HCT 38.2*  MCV 89.9  PLT 153   Basic Metabolic Panel: Recent Labs  Lab 04/27/21 1146 04/27/21 1202 04/28/21 0946  NA  --  129* 128*  K  --  4.1 3.0*  CL  --  88* 94*  CO2  --  21* 23  GLUCOSE  --  193* 172*  BUN  --  23* 22*  CREATININE  --  2.64* 1.15   CALCIUM  --  9.4 8.6*  MG 2.3  --  2.0  PHOS  --   --  2.6   GFR: Estimated Creatinine Clearance: 107.1 mL/min (by C-G formula based on SCr of 1.15 mg/dL). Liver Function Tests: Recent Labs  Lab 04/27/21 1202  AST 75*  ALT 42  ALKPHOS 94  BILITOT 1.2  PROT 9.2*  ALBUMIN 4.6   Recent Labs  Lab 04/27/21 1202  LIPASE 34   No results for input(s): AMMONIA in the last 168 hours. Coagulation Profile: No results for input(s): INR, PROTIME in the last 168 hours. Cardiac Enzymes: No results for input(s): CKTOTAL, CKMB, CKMBINDEX, TROPONINI in the last 168 hours. BNP (last 3 results) No results for input(s): PROBNP in the last 8760 hours. HbA1C: No results for input(s): HGBA1C in the last 72 hours. CBG: No results for input(s): GLUCAP in the last 168 hours. Lipid Profile: No results for input(s): CHOL, HDL, LDLCALC, TRIG, CHOLHDL, LDLDIRECT in the last 72 hours. Thyroid Function Tests: No results for input(s): TSH, T4TOTAL, FREET4, T3FREE, THYROIDAB in the last 72 hours. Anemia Panel: No results for input(s): VITAMINB12, FOLATE, FERRITIN, TIBC, IRON, RETICCTPCT in the last 72 hours. Sepsis Labs: No results for input(s): PROCALCITON, LATICACIDVEN in the last 168 hours.  Recent Results (from the past 240 hour(s))  SARS CORONAVIRUS 2 (TAT 6-24 HRS) Nasopharyngeal Nasopharyngeal Swab     Status: None   Collection Time: 04/27/21  2:47 PM   Specimen: Nasopharyngeal Swab  Result Value Ref Range Status   SARS Coronavirus 2 NEGATIVE NEGATIVE Final    Comment: (NOTE) SARS-CoV-2 target nucleic acids are NOT DETECTED.  The SARS-CoV-2 RNA is generally detectable in upper and lower respiratory specimens during the acute phase of infection. Negative results do not preclude SARS-CoV-2 infection, do not rule out co-infections with other pathogens, and should not be used as the sole basis for treatment or other patient management decisions. Negative results must be combined with  clinical observations, patient history, and epidemiological information. The expected result is Negative.  Fact Sheet for Patients: HairSlick.no  Fact Sheet for Healthcare Providers: quierodirigir.com  This test is not yet approved or cleared by the [place] FDA and  has been authorized for detection and/or diagnosis of SARS-CoV-2 by FDA under an Emergency Use Authorization (EUA). This EUA will remain  in effect (meaning this test can be used) for the duration of the COVID-19 declaration under Se ction 564(b)(1) of the Act, 21 U.S.C. section 360bbb-3(b)(1), unless the authorization is terminated or revoked sooner.  Performed at [facility], [address], [place], [place] [address]     Radiology Studies: US RENAL  Result Date: 04/27/2021 CLINICAL DATA:  Acute kidney injury. EXAM: RENAL / URINARY TRACT ULTRASOUND COMPLETE COMPARISON:  10/22/2020 renal ultrasound. 06/21/2020 complete abdominal ultrasound. FINDINGS: Right Kidney: Renal measurements: 11.1 x 4.9 x 5.1 cm = volume: 143 mL.  Echogenicity within normal limits. No mass or hydronephrosis visualized. Left Kidney: Renal measurements: 11.0 x 6.3 x 5.7 cm = volume: 209 mL. Echogenicity within normal limits. No mass or hydronephrosis visualized. Bladder: Appears normal for degree of bladder distention. Other: Diffusely increased parenchymal echogenicity of the visualized portion of the liver, also present on the prior abdominal ultrasound. IMPRESSION: 1. Unremarkable ultrasound appearance of the kidneys. 2. Hepatic steatosis. Electronically Signed   By: Sebastian Ache M.D.   On: 04/27/2021 19:36   DG Chest Portable 1 View  Result Date: 04/27/2021 CLINICAL DATA:  Patient called EMS out saying he was in someone's home yesterday with no A/C and he was concerned he got overheated. Patient drank Gatorade and Pedialyte yesterday but he has not been able to keep fluids down.  Patient reporting he has gallstones and may possibly be having his gallbladder out.; hx HTN, PE; smoker EXAM: PORTABLE CHEST 1 VIEW COMPARISON:  06/18/2020 FINDINGS: Normal heart, mediastinum and hila. Lungs are clear. No convincing pleural effusion and no pneumothorax. Skeletal structures are grossly intact. IMPRESSION: No active disease. Electronically Signed   By: Amie Portland M.D.   On: 04/27/2021 12:50     Scheduled Meds:  amLODipine  10 mg Oral Daily   ondansetron (ZOFRAN) IV  4 mg Intravenous Once   pantoprazole  40 mg Oral Daily   potassium chloride  40 mEq Oral Once   potassium chloride  40 mEq Oral Once   Continuous Infusions:  sodium chloride 100 mL/hr at 04/28/21 0600   promethazine (PHENERGAN) injection (IM or IVPB) Stopped (04/27/21 1514)     LOS: 0 days    Time spent: 25 mins    Korryn Pancoast, MD [facility]   If 7PM-7AM, please contact night-coverage

## 2021-04-29 LAB — BASIC METABOLIC PANEL
Anion gap: 9 (ref 5–15)
BUN: 13 mg/dL (ref 6–20)
CO2: 24 mmol/L (ref 22–32)
Calcium: 8.8 mg/dL — ABNORMAL LOW (ref 8.9–10.3)
Chloride: 97 mmol/L — ABNORMAL LOW (ref 98–111)
Creatinine, Ser: 0.83 mg/dL (ref 0.61–1.24)
GFR, Estimated: >60 mL/min (ref >60)
Glucose, Bld: 118 mg/dL — ABNORMAL HIGH (ref 70–99)
Potassium: 3.2 mmol/L — ABNORMAL LOW (ref 3.5–5.1)
Sodium: 130 mmol/L — ABNORMAL LOW (ref 135–145)

## 2021-04-29 MED ORDER — ACETAMINOPHEN 325 MG PO TABS
650.0000 mg | ORAL_TABLET | Freq: Four times a day (QID) | ORAL | Status: DC | PRN
  Administered 2021-04-29: 650 mg via ORAL
  Filled 2021-04-29: qty 2

## 2021-04-29 MED ORDER — POTASSIUM CHLORIDE 20 MEQ PO PACK
40.0000 meq | PACK | Freq: Once | ORAL | Status: AC
  Administered 2021-04-29: 40 meq via ORAL
  Filled 2021-04-29: qty 2

## 2021-04-29 MED ORDER — AMLODIPINE BESYLATE 10 MG PO TABS: 10.0000 mg | ORAL_TABLET | Freq: Every day | ORAL | 1 refills | Status: DC

## 2021-04-29 MED ORDER — ALUM & MAG HYDROXIDE-SIMETH 200-200-20 MG/5ML PO SUSP
30.0000 mL | ORAL | Status: DC | PRN
  Administered 2021-04-29: 30 mL via ORAL
  Filled 2021-04-29: qty 30

## 2021-04-29 NOTE — Discharge Instructions (Signed)
  Advised to take amlodipine 10 mg daily for blood pressure control.

## 2021-04-29 NOTE — Discharge Summary (Signed)
Physician Discharge Summary  Richard Lopez GXQ:[id number] DOB: 1976/05/24 DOA: 04/27/2021  PCP: [name], NP  Admit date: 04/27/2021  Discharge date: 04/29/2021  Admitted From: Home.  Disposition:  Home.  Recommendations for Outpatient Follow-up:  Follow up with PCP in 1-2 weeks. Please obtain BMP/CBC in one week. 3.   Advised to take amlodipine 10 mg daily for blood pressure control.  Home Health: None Equipment/Devices:None  Discharge Condition: Good CODE STATUS:Full code Diet recommendation: Heart Healthy   Brief [facility] course: This 45 years old male with PMH significant for HTN presented in the ED with c/o: generalized weakness and nausea.  Patient was unable to provide history at the time of presentation in the ED since he has very odd affect.  Patient reported nausea and vomiting for 3 days associated with generalized weakness. He was unable to take food down.  He has tried Prilosec without any relief.  In the ED he was found to have acute kidney injury and admitted for observation. Patient was admitted for acute kidney injury secondary to dehydration from nausea and vomiting. Serum creatinine at baseline is normal but patient presented with serum creatinine of 2.64.  He was started on IV hydration, IV Zofran for nausea and vomiting.Patient has felt better, acute renal failure has resolved with IV hydration.  Patient has developed hypokalemia which was replaced and improved.  Patient was started on clear liquid diet and advanced to regular food, He has tolerated well, Patient feels better and want to be discharged.  He was managed for below problems.  Discharge Diagnoses:  Active Problems:   AKI (acute kidney injury) (HCC)  Acute kidney injury could be secondary to dehydration.:> Resolved. Patient reports nausea and vomiting for last 3 days associated with generalized weakness. Baseline creatinine normal, presented with a serum creatinine of 2.64 Continue  gentle IV hydration, avoid nephrotoxic medications, continue to monitor renal functions Serum creatinine normalized with IV hydration. Renal ultrasound unremarkable.   Nausea and vomiting: > Resolved. Continue Zofran and Phenergan   Hypokalemia: Improved. Replaced, continue to monitor.   Hyponatremia: Improved. Patient does have chronic hyponatremia, Baseline serum sodium remains 126-130s.  Marijuana abuse.: Urine drug screen positive for marijuana. Counseled against further use.   Hypertension: Continue Norvasc.   Hyperglycemia: questionable history of diabetes mellitus, Obtain hemoglobin A1c.  Discharge Instructions  Discharge Instructions     Call MD for:  difficulty breathing, headache or visual disturbances   Complete by: As directed    Call MD for:  persistant dizziness or light-headedness   Complete by: As directed    Call MD for:  persistant nausea and vomiting   Complete by: As directed    Diet - low sodium heart healthy   Complete by: As directed    Diet general   Complete by: As directed    Discharge instructions   Complete by: As directed    Advised to follow-up with primary care physician in 1 week. Advised to take amlodipine 10 mg daily for blood pressure control.   Increase activity slowly   Complete by: As directed       Allergies as of 04/29/2021   No Known Allergies      Medication List     STOP taking these medications    pantoprazole 40 MG tablet Commonly known as: PROTONIX       TAKE these medications    acetaminophen 500 MG tablet Commonly known as: TYLENOL Take 1,000 mg by mouth every 6 (six) hours  as needed for headache (pain).   amLODipine 10 MG tablet Commonly known as: NORVASC Take 1 tablet (10 mg total) by mouth daily. Start taking on: April 30, 2021   IRON PO Take 1 tablet by mouth daily.   MAGNESIUM PO Take 1 tablet by mouth daily.   omeprazole 20 MG tablet Commonly known as: PRILOSEC OTC Take 20 mg by  mouth 2 (two) times daily.   POTASSIUM PO Take 1 tablet by mouth daily.   VITAMIN D3 PO Take 1 tablet by mouth daily.        Follow-up Information     [name], NP Follow up in 1 week(s).   Specialty: Internal Medicine Contact information: [address] [place] [address] [phone]                No Known Allergies  Consultations: None   Procedures/Studies: US RENAL  Result Date: 04/27/2021 CLINICAL DATA:  Acute kidney injury. EXAM: RENAL / URINARY TRACT ULTRASOUND COMPLETE COMPARISON:  10/22/2020 renal ultrasound. 06/21/2020 complete abdominal ultrasound. FINDINGS: Right Kidney: Renal measurements: 11.1 x 4.9 x 5.1 cm = volume: 143 mL. Echogenicity within normal limits. No mass or hydronephrosis visualized. Left Kidney: Renal measurements: 11.0 x 6.3 x 5.7 cm = volume: 209 mL. Echogenicity within normal limits. No mass or hydronephrosis visualized. Bladder: Appears normal for degree of bladder distention. Other: Diffusely increased parenchymal echogenicity of the visualized portion of the liver, also present on the prior abdominal ultrasound. IMPRESSION: 1. Unremarkable ultrasound appearance of the kidneys. 2. Hepatic steatosis. Electronically Signed   By: Sebastian Ache M.D.   On: 04/27/2021 19:36   DG Chest Portable 1 View  Result Date: 04/27/2021 CLINICAL DATA:  Patient called EMS out saying he was in someone's home yesterday with no A/C and he was concerned he got overheated. Patient drank Gatorade and Pedialyte yesterday but he has not been able to keep fluids down. Patient reporting he has gallstones and may possibly be having his gallbladder out.; hx HTN, PE; smoker EXAM: PORTABLE CHEST 1 VIEW COMPARISON:  06/18/2020 FINDINGS: Normal heart, mediastinum and hila. Lungs are clear. No convincing pleural effusion and no pneumothorax. Skeletal structures are grossly intact. IMPRESSION: No active disease. Electronically Signed   By: Amie Portland M.D.    On: 04/27/2021 12:50      Subjective: Patient was seen and examined at bedside.  Overnight events noted.  Patient reports feeling much improved.   Blood pressure has improved.  Nausea and vomiting has resolved.  He has tolerated soft bland diet,  feels better and wants to be discharged.  Discharge Exam: Vitals:   04/28/21 2105 04/29/21 0522  BP: 126/84 (!) 154/88  Pulse: 77 76  Resp: (!) 21 15  Temp: (!) 97.3 F (36.3 C) 98.4 F (36.9 C)  SpO2: 100% 100%   Vitals:   04/28/21 0700 04/28/21 1303 04/28/21 2105 04/29/21 0522  BP:  (!) 147/70 126/84 (!) 154/88  Pulse:  76 77 76  Resp:  18 (!) 21 15  Temp:  98.4 F (36.9 C) (!) 97.3 F (36.3 C) 98.4 F (36.9 C)  TempSrc:  Oral Oral Oral  SpO2:  100% 100% 100%  Weight: 97.4 kg     Height: 6\' 6"  (1.981 m)       General: Pt is alert, awake, not in acute distress Cardiovascular: RRR, S1/S2 +, no rubs, no gallops Respiratory: CTA bilaterally, no wheezing, no rhonchi Abdominal: Soft, NT, ND, bowel sounds + Extremities: no edema, no  cyanosis    The results of significant diagnostics from this hospitalization (including imaging, microbiology, ancillary and laboratory) are listed below for reference.     Microbiology: Recent Results (from the past 240 hour(s))  SARS CORONAVIRUS 2 (TAT 6-24 HRS) Nasopharyngeal Nasopharyngeal Swab     Status: None   Collection Time: 04/27/21  2:47 PM   Specimen: Nasopharyngeal Swab  Result Value Ref Range Status   SARS Coronavirus 2 NEGATIVE NEGATIVE Final    Comment: (NOTE) SARS-CoV-2 target nucleic acids are NOT DETECTED.  The SARS-CoV-2 RNA is generally detectable in upper and lower respiratory specimens during the acute phase of infection. Negative results do not preclude SARS-CoV-2 infection, do not rule out co-infections with other pathogens, and should not be used as the sole basis for treatment or other patient management decisions. Negative results must be combined with clinical  observations, patient history, and epidemiological information. The expected result is Negative.  Fact Sheet for Patients: HairSlick.no  Fact Sheet for Healthcare Providers: quierodirigir.com  This test is not yet approved or cleared by the [place] FDA and  has been authorized for detection and/or diagnosis of SARS-CoV-2 by FDA under an Emergency Use Authorization (EUA). This EUA will remain  in effect (meaning this test can be used) for the duration of the COVID-19 declaration under Se ction 564(b)(1) of the Act, 21 U.S.C. section 360bbb-3(b)(1), unless the authorization is terminated or revoked sooner.  Performed at [facility], [address], [place], [place] [address]      Labs: BNP (last 3 results) No results for input(s): BNP in the last 8760 hours. Basic Metabolic Panel: Recent Labs  Lab 04/27/21 1146 04/27/21 1202 04/28/21 0946 04/29/21 0402  NA  --  129* 128* 130*  K  --  4.1 3.0* 3.2*  CL  --  88* 94* 97*  CO2  --  21* 23 24  GLUCOSE  --  193* 172* 118*  BUN  --  23* 22* 13  CREATININE  --  2.64* 1.15 0.83  CALCIUM  --  9.4 8.6* 8.8*  MG 2.3  --  2.0  --   PHOS  --   --  2.6  --    Liver Function Tests: Recent Labs  Lab 04/27/21 1202  AST 75*  ALT 42  ALKPHOS 94  BILITOT 1.2  PROT 9.2*  ALBUMIN 4.6   Recent Labs  Lab 04/27/21 1202  LIPASE 34   No results for input(s): AMMONIA in the last 168 hours. CBC: Recent Labs  Lab 04/27/21 1202  WBC 5.2  NEUTROABS 3.3  HGB 13.4  HCT 38.2*  MCV 89.9  PLT 153   Cardiac Enzymes: No results for input(s): CKTOTAL, CKMB, CKMBINDEX, TROPONINI in the last 168 hours. BNP: Invalid input(s): POCBNP CBG: No results for input(s): GLUCAP in the last 168 hours. D-Dimer No results for input(s): DDIMER in the last 72 hours. Hgb A1c No results for input(s): HGBA1C in the last 72 hours. Lipid Profile No results for input(s): CHOL, HDL,  LDLCALC, TRIG, CHOLHDL, LDLDIRECT in the last 72 hours. Thyroid function studies No results for input(s): TSH, T4TOTAL, T3FREE, THYROIDAB in the last 72 hours.  Invalid input(s): FREET3 Anemia work up No results for input(s): VITAMINB12, FOLATE, FERRITIN, TIBC, IRON, RETICCTPCT in the last 72 hours. Urinalysis    Component Value Date/Time   COLORURINE AMBER (A) 04/27/2021 1134   APPEARANCEUR HAZY (A) 04/27/2021 1134   LABSPEC 1.019 04/27/2021 1134   PHURINE 5.0 04/27/2021 1134  GLUCOSEU NEGATIVE 04/27/2021 1134   HGBUR NEGATIVE 04/27/2021 1134   BILIRUBINUR NEGATIVE 04/27/2021 1134   KETONESUR 5 (A) 04/27/2021 1134   PROTEINUR 30 (A) 04/27/2021 1134   UROBILINOGEN 1.0 07/29/2014 1837   NITRITE NEGATIVE 04/27/2021 1134   LEUKOCYTESUR NEGATIVE 04/27/2021 1134   Sepsis Labs Invalid input(s): PROCALCITONIN,  WBC,  LACTICIDVEN Microbiology Recent Results (from the past 240 hour(s))  SARS CORONAVIRUS 2 (TAT 6-24 HRS) Nasopharyngeal Nasopharyngeal Swab     Status: None   Collection Time: 04/27/21  2:47 PM   Specimen: Nasopharyngeal Swab  Result Value Ref Range Status   SARS Coronavirus 2 NEGATIVE NEGATIVE Final    Comment: (NOTE) SARS-CoV-2 target nucleic acids are NOT DETECTED.  The SARS-CoV-2 RNA is generally detectable in upper and lower respiratory specimens during the acute phase of infection. Negative results do not preclude SARS-CoV-2 infection, do not rule out co-infections with other pathogens, and should not be used as the sole basis for treatment or other patient management decisions. Negative results must be combined with clinical observations, patient history, and epidemiological information. The expected result is Negative.  Fact Sheet for Patients: HairSlick.no  Fact Sheet for Healthcare Providers: quierodirigir.com  This test is not yet approved or cleared by the [place] FDA and  has been  authorized for detection and/or diagnosis of SARS-CoV-2 by FDA under an Emergency Use Authorization (EUA). This EUA will remain  in effect (meaning this test can be used) for the duration of the COVID-19 declaration under Se ction 564(b)(1) of the Act, 21 U.S.C. section 360bbb-3(b)(1), unless the authorization is terminated or revoked sooner.  Performed at [facility], [address], [place], [place] [address]      Time coordinating discharge: Over 30 minutes  SIGNED:   Cipriano Bunker, MD  [facility] 04/29/2021, 9:36 AM Pager   If 7PM-7AM, please contact night-coverage [email or web address]

## 2021-04-29 NOTE — Progress Notes (Signed)
D/C instructions given to patient. Patient had no questions. NT or writer will wheel patient out once he is dressed  

## 2021-04-30 ENCOUNTER — Telehealth: Payer: Self-pay

## 2021-04-30 LAB — HEMOGLOBIN A1C
Hgb A1c MFr Bld: 5 % (ref 4.8–5.6)
Mean Plasma Glucose: 97 mg/dL

## 2021-04-30 NOTE — Telephone Encounter (Signed)
Transition Care Management Follow-up Telephone Call Date of discharge and from where: 04/29/2021, [facility]  How have you been since you were released from the hospital? He said he is feeling all right.  Any questions or concerns? Yes- noted below regarding medication   Items Reviewed: Did the pt receive and understand the discharge instructions provided? Yes  Medications obtained and verified?  He said that he has not picked up the amlodipine yet, he has no money and needs to check with [facility] about the cost.  Explained to him that he could have the prescription transferred to [facility] and there may be a program available for him to obtain the medication at little or no cost.  He said he will think about it.   When asked about scheduling a follow up appointment he was not sure.  He then stated that he would call this CM back later today or tomorrow to talk further. He has the phone number to call back.

## 2021-05-08 ENCOUNTER — Inpatient Hospital Stay (HOSPITAL_COMMUNITY)
Admission: EM | Admit: 2021-05-08 | Discharge: 2021-05-10 | DRG: 394 | Disposition: A | Discharge status: Home / Self Care | Payer: Self-pay | Attending: Family Medicine | Admitting: Family Medicine

## 2021-05-08 ENCOUNTER — Encounter (HOSPITAL_COMMUNITY): Payer: Self-pay

## 2021-05-08 ENCOUNTER — Other Ambulatory Visit: Payer: Self-pay

## 2021-05-08 ENCOUNTER — Observation Stay (HOSPITAL_COMMUNITY): Payer: Self-pay

## 2021-05-08 ENCOUNTER — Emergency Department (HOSPITAL_COMMUNITY): Payer: Self-pay

## 2021-05-08 DIAGNOSIS — N179 Acute kidney failure, unspecified: Secondary | ICD-10-CM | POA: Diagnosis present

## 2021-05-08 DIAGNOSIS — R1115 Cyclical vomiting syndrome unrelated to migraine: Principal | ICD-10-CM | POA: Diagnosis present

## 2021-05-08 DIAGNOSIS — Z86711 Personal history of pulmonary embolism: Secondary | ICD-10-CM

## 2021-05-08 DIAGNOSIS — Z79899 Other long term (current) drug therapy: Secondary | ICD-10-CM

## 2021-05-08 DIAGNOSIS — Z20822 Contact with and (suspected) exposure to covid-19: Secondary | ICD-10-CM | POA: Diagnosis present

## 2021-05-08 DIAGNOSIS — F1210 Cannabis abuse, uncomplicated: Secondary | ICD-10-CM | POA: Diagnosis present

## 2021-05-08 DIAGNOSIS — Z8249 Family history of ischemic heart disease and other diseases of the circulatory system: Secondary | ICD-10-CM

## 2021-05-08 DIAGNOSIS — M19041 Primary osteoarthritis, right hand: Secondary | ICD-10-CM | POA: Diagnosis present

## 2021-05-08 DIAGNOSIS — I480 Paroxysmal atrial fibrillation: Secondary | ICD-10-CM | POA: Diagnosis present

## 2021-05-08 DIAGNOSIS — Z833 Family history of diabetes mellitus: Secondary | ICD-10-CM

## 2021-05-08 DIAGNOSIS — M19042 Primary osteoarthritis, left hand: Secondary | ICD-10-CM | POA: Diagnosis present

## 2021-05-08 DIAGNOSIS — Z7982 Long term (current) use of aspirin: Secondary | ICD-10-CM

## 2021-05-08 DIAGNOSIS — Z87891 Personal history of nicotine dependence: Secondary | ICD-10-CM

## 2021-05-08 DIAGNOSIS — I10 Essential (primary) hypertension: Secondary | ICD-10-CM | POA: Diagnosis present

## 2021-05-08 DIAGNOSIS — E1165 Type 2 diabetes mellitus with hyperglycemia: Secondary | ICD-10-CM | POA: Diagnosis present

## 2021-05-08 DIAGNOSIS — E860 Dehydration: Secondary | ICD-10-CM | POA: Diagnosis present

## 2021-05-08 DIAGNOSIS — K8020 Calculus of gallbladder without cholecystitis without obstruction: Secondary | ICD-10-CM | POA: Diagnosis present

## 2021-05-08 DIAGNOSIS — E876 Hypokalemia: Secondary | ICD-10-CM | POA: Diagnosis present

## 2021-05-08 DIAGNOSIS — R112 Nausea with vomiting, unspecified: Secondary | ICD-10-CM | POA: Diagnosis present

## 2021-05-08 DIAGNOSIS — R059 Cough, unspecified: Secondary | ICD-10-CM

## 2021-05-08 DIAGNOSIS — E871 Hypo-osmolality and hyponatremia: Secondary | ICD-10-CM | POA: Diagnosis present

## 2021-05-08 LAB — CBC WITH DIFFERENTIAL/PLATELET
Abs Immature Granulocytes: 0.04 10*3/uL (ref 0.00–0.07)
Basophils Absolute: 0 10*3/uL (ref 0.0–0.1)
Basophils Relative: 0 %
Eosinophils Absolute: 0 10*3/uL (ref 0.0–0.5)
Eosinophils Relative: 0 %
HCT: 39.8 % (ref 39.0–52.0)
Hemoglobin: 13.7 g/dL (ref 13.0–17.0)
Immature Granulocytes: 0 %
Lymphocytes Relative: 16 %
Lymphs Abs: 1.5 10*3/uL (ref 0.7–4.0)
MCH: 31.1 pg (ref 26.0–34.0)
MCHC: 34.4 g/dL (ref 30.0–36.0)
MCV: 90.5 fL (ref 80.0–100.0)
Monocytes Absolute: 1 10*3/uL (ref 0.1–1.0)
Monocytes Relative: 11 %
Neutro Abs: 7 10*3/uL (ref 1.7–7.7)
Neutrophils Relative %: 73 %
Platelets: 504 10*3/uL — ABNORMAL HIGH (ref 150–400)
RBC: 4.4 MIL/uL (ref 4.22–5.81)
RDW: 13 % (ref 11.5–15.5)
WBC: 9.6 10*3/uL (ref 4.0–10.5)
nRBC: 0 % (ref 0.0–0.2)

## 2021-05-08 LAB — MAGNESIUM: Magnesium: 2.2 mg/dL (ref 1.7–2.4)

## 2021-05-08 LAB — COMPREHENSIVE METABOLIC PANEL
ALT: 23 U/L (ref 0–44)
AST: 34 U/L (ref 15–41)
Albumin: 4.9 g/dL (ref 3.5–5.0)
Alkaline Phosphatase: 88 U/L (ref 38–126)
Anion gap: 20 — ABNORMAL HIGH (ref 5–15)
BUN: 16 mg/dL (ref 6–20)
CO2: 22 mmol/L (ref 22–32)
Calcium: 10.1 mg/dL (ref 8.9–10.3)
Chloride: 85 mmol/L — ABNORMAL LOW (ref 98–111)
Creatinine, Ser: 2.63 mg/dL — ABNORMAL HIGH (ref 0.61–1.24)
GFR, Estimated: 30 mL/min — ABNORMAL LOW (ref >60)
Glucose, Bld: 251 mg/dL — ABNORMAL HIGH (ref 70–99)
Potassium: 3 mmol/L — ABNORMAL LOW (ref 3.5–5.1)
Sodium: 127 mmol/L — ABNORMAL LOW (ref 135–145)
Total Bilirubin: 0.7 mg/dL (ref 0.3–1.2)
Total Protein: 9.9 g/dL — ABNORMAL HIGH (ref 6.5–8.1)

## 2021-05-08 LAB — CBG MONITORING, ED
Glucose-Capillary: 261 mg/dL — ABNORMAL HIGH (ref 70–99)
Glucose-Capillary: 124 mg/dL — ABNORMAL HIGH (ref 70–99)

## 2021-05-08 LAB — LIPASE, BLOOD: Lipase: 40 U/L (ref 11–51)

## 2021-05-08 LAB — SARS CORONAVIRUS 2 (TAT 6-24 HRS): SARS Coronavirus 2: NEGATIVE

## 2021-05-08 LAB — ETHANOL: Alcohol, Ethyl (B): <10 mg/dL (ref <10)

## 2021-05-08 MED ORDER — ONDANSETRON HCL 4 MG/2ML IJ SOLN
4.0000 mg | Freq: Once | INTRAMUSCULAR | Status: AC
  Administered 2021-05-08: 4 mg via INTRAVENOUS
  Filled 2021-05-08: qty 2

## 2021-05-08 MED ORDER — SODIUM CHLORIDE 0.9 % IV BOLUS
1000.0000 mL | Freq: Once | INTRAVENOUS | Status: AC
  Administered 2021-05-08: 1000 mL via INTRAVENOUS

## 2021-05-08 MED ORDER — ACETAMINOPHEN 650 MG RE SUPP: 650.0000 mg | Freq: Four times a day (QID) | RECTAL | Status: DC | PRN

## 2021-05-08 MED ORDER — PANTOPRAZOLE SODIUM 40 MG IV SOLR
40.0000 mg | Freq: Once | INTRAVENOUS | Status: AC
  Administered 2021-05-08: 40 mg via INTRAVENOUS
  Filled 2021-05-08: qty 40

## 2021-05-08 MED ORDER — SODIUM CHLORIDE 0.9 % IV SOLN: INTRAVENOUS | Status: DC

## 2021-05-08 MED ORDER — AMLODIPINE BESYLATE 10 MG PO TABS
10.0000 mg | ORAL_TABLET | Freq: Every day | ORAL | Status: DC
  Administered 2021-05-08 – 2021-05-10 (×3): 10 mg via ORAL
  Filled 2021-05-08: qty 2
  Filled 2021-05-08 (×2): qty 1

## 2021-05-08 MED ORDER — HYDROCODONE-ACETAMINOPHEN 5-325 MG PO TABS
1.0000 | ORAL_TABLET | ORAL | Status: DC | PRN
  Administered 2021-05-09 (×2): 2 via ORAL
  Filled 2021-05-08 (×2): qty 2

## 2021-05-08 MED ORDER — HYDRALAZINE HCL 20 MG/ML IJ SOLN: 10.0000 mg | Freq: Three times a day (TID) | INTRAMUSCULAR | Status: DC | PRN

## 2021-05-08 MED ORDER — POTASSIUM CHLORIDE 10 MEQ/100ML IV SOLN
10.0000 meq | INTRAVENOUS | Status: AC
  Administered 2021-05-08 (×3): 10 meq via INTRAVENOUS
  Filled 2021-05-08 (×3): qty 100

## 2021-05-08 MED ORDER — PROCHLORPERAZINE EDISYLATE 10 MG/2ML IJ SOLN
10.0000 mg | Freq: Four times a day (QID) | INTRAMUSCULAR | Status: DC | PRN
  Administered 2021-05-09: 10 mg via INTRAVENOUS
  Filled 2021-05-08: qty 2

## 2021-05-08 MED ORDER — PROCHLORPERAZINE EDISYLATE 10 MG/2ML IJ SOLN: 10.0000 mg | Freq: Once | INTRAMUSCULAR | Status: DC

## 2021-05-08 MED ORDER — ACETAMINOPHEN 325 MG PO TABS: 650.0000 mg | ORAL_TABLET | Freq: Four times a day (QID) | ORAL | Status: DC | PRN

## 2021-05-08 NOTE — ED Triage Notes (Signed)
PT BIB EMS from home for vomiting since 5pm and unable to take BP meds. Patient used marijuana on 7/4. EMS administered 4mg  Zofran. [id number].

## 2021-05-08 NOTE — ED Provider Notes (Signed)
[facility]-EMERGENCY DEPT Provider Note   CSN: [id number] Arrival date & time: 05/08/21  0737     History Chief Complaint  Patient presents with   Emesis    Richard Lopez is a 45 y.o. male.  Patient presents to the emergency department for evaluation of vomiting which started at approximately 4 PM yesterday.  Patient has had generalized abdominal cramping he has a history of paroxysmal A. fib, intractable nausea and vomiting, AKI, diabetes.  Patient states that he takes aspirin on a daily basis.  Last drink alcohol on 7/3.  Last smoked a joint of marijuana on 7/4.  EMS was called for transport and patient received Zofran in route.  Patient denies fevers, chills, URI symptoms.  No chest pain or shortness of breath.  Denies associated diarrhea or urinary symptoms.  No treatments prior to arrival.  Patient has had similar episodes in the past but cannot tell me exactly why they occur.  He states that he thinks that the hot weather yesterday triggered his current symptoms.      Past Medical History:  Diagnosis Date   Anxiety    Arthritis    hands and possibly knee   Depression    Diabetes mellitus    diet controlled   Essential hypertension    Gastritis    Gout    Normocytic anemia due to blood loss 08/21/2019   Pulmonary embolism [facility])     Patient Active Problem List   Diagnosis Date Noted   Intractable nausea and vomiting 02/12/2021   AF (paroxysmal atrial fibrillation) (HCC)    Transaminitis    Dehydration    AKI (acute kidney injury) (HCC) 06/18/2020   Atrial fibrillation with RVR (HCC) 04/22/2020   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   Hyponatremia 04/21/2020   Aspiration pneumonia (HCC) 04/21/2020   Esophagitis determined by endoscopy 08/21/2019   Anemia 08/21/2019   Hypokalemia 08/17/2019   Hypomagnesemia 08/17/2019   Hemoptysis 08/17/2019   Acute upper GI bleed 08/14/2019   Hematemesis 08/13/2019   Cavitary lesion of lung 08/01/2019   Gout  05/18/2019   Peri-prosthetic fracture of femur at tip of prosthesis 05/16/2019   Alcohol use 05/16/2019   Intertrochanteric fracture of right hip (HCC) 04/14/2019   Vitamin D deficiency 04/14/2019   DTs (delirium tremens) (HCC) 04/07/2019   Delirium tremens (HCC) 04/07/2019   Encephalopathy acute    Closed right hip fracture, initial encounter (HCC) 03/25/2019   Alcohol dependence with intoxication (HCC) 03/25/2019   Hypertensive urgency 03/25/2019   Thrombocytopenia (HCC) 03/25/2019   Effusion, right knee 03/25/2019   Closed fracture of right femur (HCC)    High anion gap metabolic acidosis    Prediabetes 04/02/2017   Gastritis 04/02/2017   Marijuana abuse 04/01/2017   Nausea with vomiting 07/29/2014   Type 2 diabetes mellitus with hyperlipidemia (HCC) 07/29/2014   Essential hypertension 07/29/2014   Nausea & vomiting 07/29/2014    Past Surgical History:  Procedure Laterality Date   ESOPHAGOGASTRODUODENOSCOPY (EGD) WITH PROPOFOL N/A 08/14/2019   Procedure: ESOPHAGOGASTRODUODENOSCOPY (EGD) WITH PROPOFOL;  Surgeon: Charlott Rakes, MD;  Location: [facility];  Service: Endoscopy;  Laterality: N/A;   EXTERNAL FIXATION LEG Left 11/07/2018   Procedure: EXTERNAL FIXATION LEFT LOWER LEG;  Surgeon: Samson Frederic, MD;  Location: [facility];  Service: Orthopedics;  Laterality: Left;   EXTERNAL FIXATION REMOVAL Left 11/08/2018   Procedure: REMOVAL EXTERNAL FIXATION LEG;  Surgeon: Roby Lofts, MD;  Location: [facility] OR;  Service: Orthopedics;  Laterality: Left;  INTRAMEDULLARY (IM) NAIL INTERTROCHANTERIC Right 03/26/2019   Procedure: INTRAMEDULLARY (IM) NAIL INTERTROCHANTRIC;  Surgeon: Roby Lofts, MD;  Location: [facility] OR;  Service: Orthopedics;  Laterality: Right;   INTRAMEDULLARY (IM) NAIL INTERTROCHANTERIC Right 05/17/2019   Procedure: Intramedullary (Im) Nail Intertroch with circlage wiring;  Surgeon: Myrene Galas, MD;  Location: [facility] OR;  Service: Orthopedics;  Laterality: Right;   NO PAST  SURGERIES     OPEN REDUCTION INTERNAL FIXATION (ORIF) TIBIA/FIBULA FRACTURE Left 11/08/2018   Procedure: OPEN REDUCTION INTERNAL FIXATION (ORIF) TIBIA/FIBULA FRACTURE;  Surgeon: Roby Lofts, MD;  Location: [facility] OR;  Service: Orthopedics;  Laterality: Left;   ORIF FEMUR FRACTURE Right 05/17/2019   Procedure: REMOVAL  OF HARDWARE;  Surgeon: Myrene Galas, MD;  Location: [facility] OR;  Service: Orthopedics;  Laterality: Right;       Family History  Problem Relation Age of Onset   Diabetes Mellitus II Father    Diabetes Mellitus II Other    CAD Other     Social History   Tobacco Use   Smoking status: Former    Packs/day: 0.00    Pack years: 0.00    Types: Cigarettes   Smokeless tobacco: Never   Tobacco comments:    About 1 cigarette per day or less  Vaping Use   Vaping Use: Never used  Substance Use Topics   Alcohol use: Yes    Alcohol/week: 200.0 standard drinks    Types: 200 Cans of beer per week   Drug use: Yes    Types: Marijuana    Home Medications Prior to Admission medications   Medication Sig Start Date End Date Taking? Authorizing Provider  acetaminophen (TYLENOL) 500 MG tablet Take 1,000 mg by mouth every 6 (six) hours as needed for headache (pain).    [provider]  amLODipine (NORVASC) 10 MG tablet Take 1 tablet (10 mg total) by mouth daily. 04/30/21   Cipriano Bunker, MD  Cholecalciferol (VITAMIN D3 PO) Take 1 tablet by mouth daily.    [provider]  Ferrous Sulfate (IRON PO) Take 1 tablet by mouth daily.    [provider]  MAGNESIUM PO Take 1 tablet by mouth daily.    [provider]  omeprazole (PRILOSEC OTC) 20 MG tablet Take 20 mg by mouth 2 (two) times daily.    [provider]  POTASSIUM PO Take 1 tablet by mouth daily.    [provider]    Allergies    Patient has no known allergies.  Review of Systems   Review of Systems  Constitutional:  Negative for fever.  HENT:  Negative for rhinorrhea and  sore throat.   Eyes:  Negative for redness.  Respiratory:  Negative for cough.   Cardiovascular:  Negative for chest pain.  Gastrointestinal:  Positive for abdominal pain, nausea and vomiting. Negative for diarrhea.  Genitourinary:  Negative for dysuria and hematuria.  Musculoskeletal:  Negative for myalgias.  Skin:  Negative for rash.  Neurological:  Negative for headaches.   Physical Exam Updated Vital Signs BP (!) 179/113   Pulse 94   Temp 97.9 F (36.6 C)   Resp (!) 26   Ht 6\' 6"  (1.981 m)   Wt 97 kg   SpO2 99%   BMI 24.71 kg/m   Physical Exam Vitals and nursing note reviewed.  Constitutional:      General: He is not in acute distress.    Appearance: He is well-developed.  HENT:     Lopez: Normocephalic and atraumatic.  Mouth/Throat:     Mouth: Mucous membranes are dry.     Comments: Slightly dry mucous membranes. Eyes:     General:        Right eye: No discharge.        Left eye: No discharge.     Conjunctiva/sclera: Conjunctivae normal.  Cardiovascular:     Rate and Rhythm: Normal rate.     Heart sounds: Normal heart sounds.  Pulmonary:     Effort: Pulmonary effort is normal.     Breath sounds: Normal breath sounds.  Abdominal:     Palpations: Abdomen is soft.     Tenderness: There is no abdominal tenderness. There is no guarding or rebound.     Comments: No focal abdominal tenderness.  Musculoskeletal:     Cervical back: Normal range of motion and neck supple.     Right lower leg: No edema.     Left lower leg: No edema.  Skin:    General: Skin is warm and dry.  Neurological:     Mental Status: He is alert.    ED Results / Procedures / Treatments   Labs (all labs ordered are listed, but only abnormal results are displayed) Labs Reviewed  CBC WITH DIFFERENTIAL/PLATELET - Abnormal; Notable for the following components:      Result Value   Platelets 504 (*)    All other components within normal limits  COMPREHENSIVE METABOLIC PANEL - Abnormal;  Notable for the following components:   Sodium 127 (*)    Potassium 3.0 (*)    Chloride 85 (*)    Glucose, Bld 251 (*)    Creatinine, Ser 2.63 (*)    Total Protein 9.9 (*)    GFR, Estimated 30 (*)    Anion gap 20 (*)    All other components within normal limits  CBG MONITORING, ED - Abnormal; Notable for the following components:   Glucose-Capillary 261 (*)    All other components within normal limits  SARS CORONAVIRUS 2 (TAT 6-24 HRS)  LIPASE, BLOOD  ETHANOL    EKG EKG Interpretation  Date/Time:  Wednesday May 08 2021 07:57:25 EDT Ventricular Rate:  90 PR Interval:  140 QRS Duration: 93 QT Interval:  393 QTC Calculation: 481 R Axis:   68 Text Interpretation: Sinus rhythm Supraventricular bigeminy Right atrial enlargement Minimal ST depression, lateral leads Borderline prolonged QT interval No significant change since last tracing Confirmed by Susy Frizzle [phone]) on 05/08/2021 8:06:06 AM  Radiology DG Chest Port 1 View  Result Date: 05/08/2021 CLINICAL DATA:  45 year old male with history of cough and vomiting. EXAM: PORTABLE CHEST 1 VIEW COMPARISON:  Chest x-ray 04/27/2021. FINDINGS: Lung volumes are normal. No consolidative airspace disease. No pleural effusions. No pneumothorax. No pulmonary nodule or mass noted. Pulmonary vasculature and the cardiomediastinal silhouette are within normal limits. IMPRESSION: No radiographic evidence of acute cardiopulmonary disease. Electronically Signed   By: Trudie Reed M.D.   On: 05/08/2021 08:43    Procedures Procedures   Medications Ordered in ED Medications  potassium chloride 10 mEq in 100 mL IVPB (10 mEq Intravenous New Bag/Given 05/08/21 1116)  prochlorperazine (COMPAZINE) injection 10 mg (has no administration in time range)  sodium chloride 0.9 % bolus 1,000 mL (0 mLs Intravenous Stopped 05/08/21 1046)  ondansetron (ZOFRAN) injection 4 mg (4 mg Intravenous Given 05/08/21 0942)  sodium chloride 0.9 % bolus 1,000 mL (1,000  mLs Intravenous New Bag/Given 05/08/21 1116)  pantoprazole (PROTONIX) injection 40 mg (40 mg Intravenous Given 05/08/21 1116)  ED Course  I have reviewed the triage vital signs and the nursing notes.  Pertinent labs & imaging results that were available during my care of the patient were reviewed by me and considered in my medical decision making (see chart for details).  Patient seen and examined. Work-up initiated. Medications ordered. Reviewed previous visits.   Vital signs reviewed and are as follows: BP (!) 179/113   Pulse 94   Temp 97.9 F (36.6 C)   Resp (!) 26   Ht  (1.981 m)   Wt 97 kg   SpO2 99%   BMI 24.71 kg/m   Patient with acute kidney injury and hypokalemia.  He will need to be admitted to the hospital.  Patient updated.  Additional IV fluids ordered.  Patient has had vomiting.  11:23 AM Spoke with Dr. Ronaldo Miyamoto who will see patient. COVID pending.    MDM Rules/Calculators/A&P                          Admit.   Final Clinical Impression(s) / ED Diagnoses Final diagnoses:  AKI (acute kidney injury) (HCC)  Intractable vomiting with nausea, unspecified vomiting type  Cough    Rx / DC Orders ED Discharge Orders     None        Renne Crigler, PA-C 05/08/21 1123    Pollyann Savoy, MD 05/08/21 1322

## 2021-05-08 NOTE — Plan of Care (Signed)

## 2021-05-08 NOTE — H&P (Addendum)
History and Physical    Richard Lopez NIO:[id number] DOB: November 14, 1975 DOA: 05/08/2021  PCP: [name], NP  Patient coming from: Home  Chief Complaint: N/V  HPI: Richard Lopez is a 45 y.o. male with medical history significant of mj abuse, HTN. Presenting with nausea and vomiting for the last 5 days. He reports poor PO intake during that time. He denies any sick contacts. He denies any diet changes. He did not try any prescribed meds to make his symptoms better. He reports using marijuana throughout the weekend. When his symptoms didn't improve this morning, he called EMS for help. He denies any other aggravating or alleviating factors.   ED Course: Was given zofran and fluids. Did not respond. [facility] was called for admission.   Review of Systems:  Denies CP, dyspnea, palpitations, syncopal episodes, seizure, diarrhea, fevers. Review of systems is otherwise negative for all not mentioned in HPI.   PMHx Past Medical History:  Diagnosis Date   Anxiety    Arthritis    hands and possibly knee   Depression    Diabetes mellitus    diet controlled   Essential hypertension    Gastritis    Gout    Normocytic anemia due to blood loss 08/21/2019   Pulmonary embolism (HCC)     PSHx Past Surgical History:  Procedure Laterality Date   ESOPHAGOGASTRODUODENOSCOPY (EGD) WITH PROPOFOL N/A 08/14/2019   Procedure: ESOPHAGOGASTRODUODENOSCOPY (EGD) WITH PROPOFOL;  Surgeon: Charlott Rakes, MD;  Location: [facility];  Service: Endoscopy;  Laterality: N/A;   EXTERNAL FIXATION LEG Left 11/07/2018   Procedure: EXTERNAL FIXATION LEFT LOWER LEG;  Surgeon: Samson Frederic, MD;  Location: [facility];  Service: Orthopedics;  Laterality: Left;   EXTERNAL FIXATION REMOVAL Left 11/08/2018   Procedure: REMOVAL EXTERNAL FIXATION LEG;  Surgeon: Roby Lofts, MD;  Location: [facility];  Service: Orthopedics;  Laterality: Left;   INTRAMEDULLARY (IM) NAIL INTERTROCHANTERIC Right 03/26/2019   Procedure: INTRAMEDULLARY  (IM) NAIL INTERTROCHANTRIC;  Surgeon: Roby Lofts, MD;  Location: [facility];  Service: Orthopedics;  Laterality: Right;   INTRAMEDULLARY (IM) NAIL INTERTROCHANTERIC Right 05/17/2019   Procedure: Intramedullary (Im) Nail Intertroch with circlage wiring;  Surgeon: Myrene Galas, MD;  Location: [facility] OR;  Service: Orthopedics;  Laterality: Right;   NO PAST SURGERIES     OPEN REDUCTION INTERNAL FIXATION (ORIF) TIBIA/FIBULA FRACTURE Left 11/08/2018   Procedure: OPEN REDUCTION INTERNAL FIXATION (ORIF) TIBIA/FIBULA FRACTURE;  Surgeon: Roby Lofts, MD;  Location: [facility];  Service: Orthopedics;  Laterality: Left;   ORIF FEMUR FRACTURE Right 05/17/2019   Procedure: REMOVAL  OF HARDWARE;  Surgeon: Myrene Galas, MD;  Location: [facility];  Service: Orthopedics;  Laterality: Right;    SocHx  reports that he has quit smoking. His smoking use included cigarettes. He has never used smokeless tobacco. He reports current alcohol use of about 200.0 standard drinks of alcohol per week. He reports current drug use. Drug: Marijuana.  No Known Allergies  FamHx Family History  Problem Relation Age of Onset   Diabetes Mellitus II Father    Diabetes Mellitus II Other    CAD Other     Prior to Admission medications   Medication Sig Start Date End Date Taking? Authorizing Provider  acetaminophen (TYLENOL) 500 MG tablet Take 1,000 mg by mouth every 6 (six) hours as needed for headache (pain).    [provider]  amLODipine (NORVASC) 10 MG tablet Take 1 tablet (10 mg total) by mouth daily. 04/30/21   Lucianne Muss,  Pardeep, MD  Cholecalciferol (VITAMIN D3 PO) Take 1 tablet by mouth daily.    [provider]  Ferrous Sulfate (IRON PO) Take 1 tablet by mouth daily.    [provider]  MAGNESIUM PO Take 1 tablet by mouth daily.    [provider]  omeprazole (PRILOSEC OTC) 20 MG tablet Take 20 mg by mouth 2 (two) times daily.    [provider]  POTASSIUM PO Take 1 tablet by mouth daily.     [provider]    Physical Exam: Vitals:   05/08/21 0830 05/08/21 0900 05/08/21 0945 05/08/21 1000  BP:   (!) 162/118 (!) 171/114  Pulse: 94 86 89 95  Resp: (!) 26 11 19 18   Temp:      SpO2: 99% 93% 100% 100%  Weight:      Height:        General: 45 y.o. male resting in bed in NAD Eyes: PERRL, normal sclera ENMT: Nares patent w/o discharge, orophaynx clear, dentition normal, ears w/o discharge/lesions/ulcers Neck: Supple, trachea midline Cardiovascular: RRR, +S1, S2, no m/g/r, equal pulses throughout Respiratory: CTABL, no w/r/r, normal WOB GI: BS+, NDNT, no masses noted, no organomegaly noted MSK: No e/c/c Skin: No rashes, bruises, ulcerations noted Neuro: A&O x 3, no focal deficits Psyc: Appropriate interaction and affect, calm/cooperative  Labs on Admission: I have personally reviewed following labs and imaging studies  CBC: Recent Labs  Lab 05/08/21 0818  WBC 9.6  NEUTROABS 7.0  HGB 13.7  HCT 39.8  MCV 90.5  PLT 504*   Basic Metabolic Panel: Recent Labs  Lab 05/08/21 0818  NA 127*  K 3.0*  CL 85*  CO2 22  GLUCOSE 251*  BUN 16  CREATININE 2.63*  CALCIUM 10.1   GFR: Estimated Creatinine Clearance: 46.8 mL/min (A) (by C-G formula based on SCr of 2.63 mg/dL (H)). Liver Function Tests: Recent Labs  Lab 05/08/21 0818  AST 34  ALT 23  ALKPHOS 88  BILITOT 0.7  PROT 9.9*  ALBUMIN 4.9   Recent Labs  Lab 05/08/21 0818  LIPASE 40   No results for input(s): AMMONIA in the last 168 hours. Coagulation Profile: No results for input(s): INR, PROTIME in the last 168 hours. Cardiac Enzymes: No results for input(s): CKTOTAL, CKMB, CKMBINDEX, TROPONINI in the last 168 hours. BNP (last 3 results) No results for input(s): PROBNP in the last 8760 hours. HbA1C: No results for input(s): HGBA1C in the last 72 hours. CBG: Recent Labs  Lab 05/08/21 0758  GLUCAP 261*   Lipid Profile: No results for input(s): CHOL, HDL, LDLCALC, TRIG, CHOLHDL,  LDLDIRECT in the last 72 hours. Thyroid Function Tests: No results for input(s): TSH, T4TOTAL, FREET4, T3FREE, THYROIDAB in the last 72 hours. Anemia Panel: No results for input(s): VITAMINB12, FOLATE, FERRITIN, TIBC, IRON, RETICCTPCT in the last 72 hours. Urine analysis:    Component Value Date/Time   COLORURINE AMBER (A) 04/27/2021 1134   APPEARANCEUR HAZY (A) 04/27/2021 1134   LABSPEC 1.019 04/27/2021 1134   PHURINE 5.0 04/27/2021 1134   GLUCOSEU NEGATIVE 04/27/2021 1134   HGBUR NEGATIVE 04/27/2021 1134   BILIRUBINUR NEGATIVE 04/27/2021 1134   KETONESUR 5 (A) 04/27/2021 1134   PROTEINUR 30 (A) 04/27/2021 1134   UROBILINOGEN 1.0 07/29/2014 1837   NITRITE NEGATIVE 04/27/2021 1134   LEUKOCYTESUR NEGATIVE 04/27/2021 1134    Radiological Exams on Admission: DG Chest Port 1 View  Result Date: 05/08/2021 CLINICAL DATA:  45 year old male with history of cough and vomiting. EXAM:  PORTABLE CHEST 1 VIEW COMPARISON:  Chest x-ray 04/27/2021. FINDINGS: Lung volumes are normal. No consolidative airspace disease. No pleural effusions. No pneumothorax. No pulmonary nodule or mass noted. Pulmonary vasculature and the cardiomediastinal silhouette are within normal limits. IMPRESSION: No radiographic evidence of acute cardiopulmonary disease. Electronically Signed   By: Trudie Reed M.D.   On: 05/08/2021 08:43    EKG: Independently reviewed. 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     -  A1c 2 weeks ago was 5.0     - fluids, follow daily glucose for now     - he does have a gap, but is not acidotic; he's had 2L bolus since that check; rpt his glucose. If still significantly elevated, will give small OT dose lantus  DVT prophylaxis: SCDs  Code Status: FULL  Family Communication: None at bedside  Consults called: None   Status is: Observation  The patient remains OBS appropriate and will d/c before 2 midnights.  Dispo: The patient is from: Home              Anticipated d/c is to: Home              Patient currently is not medically stable to d/c.   Difficult to place patient No  Teddy Spike DO [facility]  If 7PM-7AM, please contact night-coverage [email or web address]  05/08/2021, 11:22 AM

## 2021-05-09 LAB — CBC
HCT: 35.2 % — ABNORMAL LOW (ref 39.0–52.0)
Hemoglobin: 12.2 g/dL — ABNORMAL LOW (ref 13.0–17.0)
MCH: 31.6 pg (ref 26.0–34.0)
MCHC: 34.7 g/dL (ref 30.0–36.0)
MCV: 91.2 fL (ref 80.0–100.0)
Platelets: 392 10*3/uL (ref 150–400)
RBC: 3.86 MIL/uL — ABNORMAL LOW (ref 4.22–5.81)
RDW: 13.2 % (ref 11.5–15.5)
WBC: 7.7 10*3/uL (ref 4.0–10.5)
nRBC: 0 % (ref 0.0–0.2)

## 2021-05-09 LAB — COMPREHENSIVE METABOLIC PANEL
ALT: 21 U/L (ref 0–44)
AST: 32 U/L (ref 15–41)
Albumin: 3.8 g/dL (ref 3.5–5.0)
Alkaline Phosphatase: 69 U/L (ref 38–126)
Anion gap: 14 (ref 5–15)
BUN: 17 mg/dL (ref 6–20)
CO2: 22 mmol/L (ref 22–32)
Calcium: 8.8 mg/dL — ABNORMAL LOW (ref 8.9–10.3)
Chloride: 93 mmol/L — ABNORMAL LOW (ref 98–111)
Creatinine, Ser: 1.3 mg/dL — ABNORMAL HIGH (ref 0.61–1.24)
GFR, Estimated: >60 mL/min (ref >60)
Glucose, Bld: 113 mg/dL — ABNORMAL HIGH (ref 70–99)
Potassium: 3.1 mmol/L — ABNORMAL LOW (ref 3.5–5.1)
Sodium: 129 mmol/L — ABNORMAL LOW (ref 135–145)
Total Bilirubin: 0.7 mg/dL (ref 0.3–1.2)
Total Protein: 7.6 g/dL (ref 6.5–8.1)

## 2021-05-09 MED ORDER — POTASSIUM CHLORIDE 10 MEQ/100ML IV SOLN
10.0000 meq | INTRAVENOUS | Status: AC
  Administered 2021-05-09 (×3): 10 meq via INTRAVENOUS
  Filled 2021-05-09 (×3): qty 100

## 2021-05-09 NOTE — Progress Notes (Signed)
Pt tolerating clear liquid diet without nausea and increase pain, request to advance diet, MD updated, soft diet ordered. SRP, RN

## 2021-05-09 NOTE — Plan of Care (Signed)
  Problem: Education: Goal: Knowledge of General Education information will improve Description Including pain rating scale, medication(s)/side effects and non-pharmacologic comfort measures Outcome: Progressing   Problem: Health Behavior/Discharge Planning: Goal: Ability to manage health-related needs will improve Outcome: Progressing   

## 2021-05-09 NOTE — Progress Notes (Signed)
PROGRESS NOTE    Richard Lopez  DYJ:[id number] DOB: August 18, 1976 DOA: 05/08/2021 PCP: [name], NP   Brief Narrative:  This 45 years old male with PMH significant for marijuana abuse, hypertension presented in the ED with complaints of nausea and vomiting for last 5 days.  Patient also reported decreased p.o. intake during that time,  he denies any sick contacts,  he denies any diet changes,  he did not use any prescribed medication to make his symptoms better.  he reports using marijuana throughout this weekend.  He denies any other symptoms.  Assessment & Plan:   Active Problems:   Intractable nausea and vomiting  Intractable Nausea and vomiting: This could be secondary to cyclical vomiting secondary to chronic marijuana use. Continue IV fluids, clear liquid diet.>  Advance as tolerated. Continue Compazine and Zofran for nausea vomiting. Patient reports slight improvement in symptoms.  Hypertension: Continue amlodipine, hydralazine as needed.  Marijuana abuse: Patient was counseled against further use.  Chronic hyponatremia: His sodium remains between 125- 131 in last 5 months. This could be due to persistent nausea and vomiting. Continue IV hydration, continue to monitor.  Hypokalemia : Replaced, continue to monitor  History of diabetes type 2; Well-controlled, last hemoglobin A1c 5.0  AKI:> Improving This could be secondary to persistent nausea and vomiting. Serum creatinine 2.63> trending down 1.30 Avoid nephrotoxic medications, continue to monitor.   DVT prophylaxis: Lovenox Code Status: Full code. Family Communication: No family at bed side. Disposition Plan:   Status is: Observation  The patient remains OBS appropriate and will d/c before 2 midnights.  Dispo: The patient is from: Home              Anticipated d/c is to: Home              Patient currently is not medically stable to d/c.   Difficult to place patient No  Consultants:   None  Procedures:  None  Antimicrobials:  Anti-infectives (From admission, onward)    None        Subjective: Patient was seen and examined at bedside.  Overnight events noted.  Patient reports feeling much improved.  He still has persistent nausea and vomiting.  Objective: Vitals:   05/09/21 0211 05/09/21 0601 05/09/21 1020 05/09/21 1159  BP: (!) 132/97 131/87 (!) 144/89 134/84  Pulse: 76 62 81 70  Resp:   (!) 22 18  Temp: 98.2 F (36.8 C) 98.4 F (36.9 C) 98.2 F (36.8 C) 97.6 F (36.4 C)  TempSrc: Oral Oral Oral Oral  SpO2: 100% 98% 99% 100%  Weight:      Height:        Intake/Output Summary (Last 24 hours) at 05/09/2021 1311 Last data filed at 05/09/2021 1205 Gross per 24 hour  Intake 1072.36 ml  Output 600 ml  Net 472.36 ml   Filed Weights   05/08/21 0750  Weight: 97 kg    Examination:  General exam: Appears calm and comfortable , not in any acute distress. Respiratory system: Clear to auscultation. Respiratory effort normal. Cardiovascular system: S1 & S2 heard, RRR. No JVD, murmurs, rubs, gallops or clicks. No pedal edema. Gastrointestinal system: Abdomen is nondistended, soft and nontender. No organomegaly or masses felt. Normal bowel sounds heard. Central nervous system: Alert and oriented. No focal neurological deficits. Extremities: Symmetric 5 x 5 power.  No edema, no cyanosis, no clubbing. Skin: No rashes, lesions or ulcers Psychiatry: Judgement and insight appear normal. Mood & affect appropriate.  Data Reviewed: I have personally reviewed following labs and imaging studies  CBC: Recent Labs  Lab 05/08/21 0818 05/09/21 0337  WBC 9.6 7.7  NEUTROABS 7.0  --   HGB 13.7 12.2*  HCT 39.8 35.2*  MCV 90.5 91.2  PLT 504* 392   Basic Metabolic Panel: Recent Labs  Lab 05/08/21 0818 05/08/21 1615 05/09/21 0337  NA 127*  --  129*  K 3.0*  --  3.1*  CL 85*  --  93*  CO2 22  --  22  GLUCOSE 251*  --  113*  BUN 16  --  17  CREATININE  2.63*  --  1.30*  CALCIUM 10.1  --  8.8*  MG  --  2.2  --    GFR: Estimated Creatinine Clearance: 94.7 mL/min (A) (by C-G formula based on SCr of 1.3 mg/dL (H)). Liver Function Tests: Recent Labs  Lab 05/08/21 0818 05/09/21 0337  AST 34 32  ALT 23 21  ALKPHOS 88 69  BILITOT 0.7 0.7  PROT 9.9* 7.6  ALBUMIN 4.9 3.8   Recent Labs  Lab 05/08/21 0818  LIPASE 40   No results for input(s): AMMONIA in the last 168 hours. Coagulation Profile: No results for input(s): INR, PROTIME in the last 168 hours. Cardiac Enzymes: No results for input(s): CKTOTAL, CKMB, CKMBINDEX, TROPONINI in the last 168 hours. BNP (last 3 results) No results for input(s): PROBNP in the last 8760 hours. HbA1C: No results for input(s): HGBA1C in the last 72 hours. CBG: Recent Labs  Lab 05/08/21 0758 05/08/21 1504  GLUCAP 261* 124*   Lipid Profile: No results for input(s): CHOL, HDL, LDLCALC, TRIG, CHOLHDL, LDLDIRECT in the last 72 hours. Thyroid Function Tests: No results for input(s): TSH, T4TOTAL, FREET4, T3FREE, THYROIDAB in the last 72 hours. Anemia Panel: No results for input(s): VITAMINB12, FOLATE, FERRITIN, TIBC, IRON, RETICCTPCT in the last 72 hours. Sepsis Labs: No results for input(s): PROCALCITON, LATICACIDVEN in the last 168 hours.  Recent Results (from the past 240 hour(s))  SARS CORONAVIRUS 2 (TAT 6-24 HRS) Nasopharyngeal Nasopharyngeal Swab     Status: None   Collection Time: 05/08/21  8:18 AM   Specimen: Nasopharyngeal Swab  Result Value Ref Range Status   SARS Coronavirus 2 NEGATIVE NEGATIVE Final    Comment: (NOTE) SARS-CoV-2 target nucleic acids are NOT DETECTED.  The SARS-CoV-2 RNA is generally detectable in upper and lower respiratory specimens during the acute phase of infection. Negative results do not preclude SARS-CoV-2 infection, do not rule out co-infections with other pathogens, and should not be used as the sole basis for treatment or other patient management  decisions. Negative results must be combined with clinical observations, patient history, and epidemiological information. The expected result is Negative.  Fact Sheet for Patients: HairSlick.no  Fact Sheet for Healthcare Providers: quierodirigir.com  This test is not yet approved or cleared by the [place] FDA and  has been authorized for detection and/or diagnosis of SARS-CoV-2 by FDA under an Emergency Use Authorization (EUA). This EUA will remain  in effect (meaning this test can be used) for the duration of the COVID-19 declaration under Se ction 564(b)(1) of the Act, 21 U.S.C. section 360bbb-3(b)(1), unless the authorization is terminated or revoked sooner.  Performed at [facility], [address] [place], [place] [address]     Radiology Studies: US RENAL  Result Date: 05/08/2021 CLINICAL DATA:  Acute kidney injury. EXAM: RENAL / URINARY TRACT ULTRASOUND COMPLETE COMPARISON:  Renal ultrasound 2 weeks ago  04/27/2021 FINDINGS: Right Kidney: Renal measurements: 11.1 x 4.9 x 5.1 cm = volume: 144 mL. Echogenicity within normal limits. No hydronephrosis. No visualized stone or focal lesion. Left Kidney: Renal measurements: 11.0 x 6.3 x 5.7 cm = volume: 2 9 mL. Echogenicity within normal limits. No hydronephrosis. No visualized stone or focal lesion. Bladder: Appears normal for degree of bladder distention. Other: Hepatic steatosis incidentally noted. IMPRESSION: Unremarkable sonographic appearance of the kidneys. Electronically Signed   By: Narda Rutherford M.D.   On: 05/08/2021 17:06   DG Chest Port 1 View  Result Date: 05/08/2021 CLINICAL DATA:  45 year old male with history of cough and vomiting. EXAM: PORTABLE CHEST 1 VIEW COMPARISON:  Chest x-ray 04/27/2021. FINDINGS: Lung volumes are normal. No consolidative airspace disease. No pleural effusions. No pneumothorax. No pulmonary nodule or mass noted. Pulmonary  vasculature and the cardiomediastinal silhouette are within normal limits. IMPRESSION: No radiographic evidence of acute cardiopulmonary disease. Electronically Signed   By: Trudie Reed M.D.   On: 05/08/2021 08:43     Scheduled Meds:  amLODipine  10 mg Oral Daily   prochlorperazine  10 mg Intravenous Once   Continuous Infusions:  sodium chloride 100 mL/hr at 05/09/21 0851     LOS: 0 days    Time spent: 35 mins    Almer Bushey, MD [facility]   If 7PM-7AM, please contact night-coverage

## 2021-05-10 LAB — CBC
HCT: 32.3 % — ABNORMAL LOW (ref 39.0–52.0)
Hemoglobin: 11 g/dL — ABNORMAL LOW (ref 13.0–17.0)
MCH: 31.5 pg (ref 26.0–34.0)
MCHC: 34.1 g/dL (ref 30.0–36.0)
MCV: 92.6 fL (ref 80.0–100.0)
Platelets: 364 10*3/uL (ref 150–400)
RBC: 3.49 MIL/uL — ABNORMAL LOW (ref 4.22–5.81)
RDW: 13.2 % (ref 11.5–15.5)
WBC: 5.6 10*3/uL (ref 4.0–10.5)
nRBC: 0 % (ref 0.0–0.2)

## 2021-05-10 LAB — BASIC METABOLIC PANEL
Anion gap: 8 (ref 5–15)
BUN: 11 mg/dL (ref 6–20)
CO2: 23 mmol/L (ref 22–32)
Calcium: 8.5 mg/dL — ABNORMAL LOW (ref 8.9–10.3)
Chloride: 97 mmol/L — ABNORMAL LOW (ref 98–111)
Creatinine, Ser: 0.86 mg/dL (ref 0.61–1.24)
GFR, Estimated: >60 mL/min (ref >60)
Glucose, Bld: 107 mg/dL — ABNORMAL HIGH (ref 70–99)
Potassium: 3.5 mmol/L (ref 3.5–5.1)
Sodium: 128 mmol/L — ABNORMAL LOW (ref 135–145)

## 2021-05-10 LAB — PHOSPHORUS: Phosphorus: 2.7 mg/dL (ref 2.5–4.6)

## 2021-05-10 LAB — MAGNESIUM: Magnesium: 1.7 mg/dL (ref 1.7–2.4)

## 2021-05-10 NOTE — Plan of Care (Signed)
  Problem: Activity: Goal: Risk for activity intolerance will decrease Outcome: Completed/Met   Problem: Nutrition: Goal: Adequate nutrition will be maintained Outcome: Completed/Met

## 2021-05-10 NOTE — Plan of Care (Signed)
  Problem: Education: Goal: Knowledge of General Education information will improve Description: Including pain rating scale, medication(s)/side effects and non-pharmacologic comfort measures 05/10/2021 1205 by Mariann Laster, RN Outcome: Adequate for Discharge 05/10/2021 1201 by Mariann Laster, RN Outcome: Progressing   Problem: Health Behavior/Discharge Planning: Goal: Ability to manage health-related needs will improve 05/10/2021 1205 by Mariann Laster, RN Outcome: Adequate for Discharge 05/10/2021 1201 by Mariann Laster, RN Outcome: Progressing   Problem: Clinical Measurements: Goal: Diagnostic test results will improve 05/10/2021 1205 by Mariann Laster, RN Outcome: Adequate for Discharge 05/10/2021 1201 by Mariann Laster, RN Outcome: Progressing

## 2021-05-10 NOTE — Plan of Care (Signed)
  Problem: Education: Goal: Knowledge of General Education information will improve Description: Including pain rating scale, medication(s)/side effects and non-pharmacologic comfort measures Outcome: Progressing   Problem: Health Behavior/Discharge Planning: Goal: Ability to manage health-related needs will improve Outcome: Progressing   Problem: Clinical Measurements: Goal: Diagnostic test results will improve Outcome: Progressing   

## 2021-05-10 NOTE — Plan of Care (Signed)
  Problem: Activity: Goal: Risk for activity intolerance will decrease Outcome: Completed/Met

## 2021-05-10 NOTE — Discharge Summary (Signed)
Physician Discharge Summary  Richard Lopez XBJ:[id number] DOB: 04-30-1976 DOA: 05/08/2021  PCP: [name], NP  Admit date: 05/08/2021  Discharge date: 05/10/2021  Admitted From: Home.  Disposition:  Home.  Recommendations for Outpatient Follow-up:  Follow up with PCP in 1-2 weeks Please obtain BMP/CBC in one week Advised to refrain from marijuana use.  Home Health: None Equipment/Devices: None  Discharge Condition: Good CODE STATUS:Full code Diet recommendation: Heart Healthy   Brief [facility] Course: This 44 years old male with PMH significant for marijuana abuse, hypertension presented in the ED with complaints of nausea and vomiting for last 5 days. Patient also reported decreased p.o. intake during that time, He denied any sick contacts, He denied any diet changes,  he did not use any prescribed medication to make his symptoms better. He reports using marijuana throughout this weekend.  He denies any other symptoms. Patient was admitted for intractable nausea and vomiting.  Patient was started on IV Zofran and continued on IV fluids.  He also has slightly elevated serum creatinine consistent with acute kidney injury secondary to dehydration from nausea and vomiting, hypokalemia.  He felt better with IV hydration,  Potassium has normalized, serum creatinine has normalized,  Nausea and vomiting has resolved.  Renal ultrasound unremarkable.  Patient feels better and wants to be discharged.  Patient was advised to refrain from using marijuana.  Patient is being discharged home.  He was managed for below problems.  Discharge Diagnoses:  Active Problems:   Intractable nausea and vomiting  Intractable Nausea and vomiting: > Resolved. This could be secondary to cyclical vomiting secondary to chronic marijuana use. Continue IV fluids, clear liquid diet.>  Advanced as tolerated. Continue Compazine and Zofran for nausea vomiting. Patient reports significant improvement in  symptoms.  Nausea and vomiting resolved.   Hypertension: Continue amlodipine, hydralazine as needed.   Marijuana abuse: Patient was counseled against further use.   Chronic hyponatremia: His sodium remains between 125- 131 in last 5 months. This could be due to persistent nausea and vomiting. Serum sodium at baseline.   Hypokalemia : Replaced, continue to monitor   History of diabetes type 2; Well-controlled, last hemoglobin A1c 5.0   AKI:> Improved This could be secondary to persistent nausea and vomiting. Serum creatinine 2.63> 1.30 > 0.89 Avoid nephrotoxic medications, continue to monitor.  Discharge Instructions  Discharge Instructions     Call MD for:  difficulty breathing, headache or visual disturbances   Complete by: As directed    Call MD for:  persistant dizziness or light-headedness   Complete by: As directed    Call MD for:  persistant nausea and vomiting   Complete by: As directed    Diet - low sodium heart healthy   Complete by: As directed    Diet Carb Modified   Complete by: As directed    Discharge instructions   Complete by: As directed    Advised to follow-up with primary care physician in 1 week. Advise to take regular diet. Advised to refrain from marijuana use.   Increase activity slowly   Complete by: As directed       Allergies as of 05/10/2021   No Known Allergies      Medication List     TAKE these medications    amLODipine 10 MG tablet Commonly known as: NORVASC Take 1 tablet (10 mg total) by mouth daily.        Follow-up Information     [name], NP Follow  up in 1 week(s).   Specialty: Internal Medicine Contact information: [address] [place] [phone]                No Known Allergies  Consultations: None   Procedures/Studies: US RENAL  Result Date: 05/08/2021 CLINICAL DATA:  Acute kidney injury. EXAM: RENAL / URINARY TRACT ULTRASOUND COMPLETE COMPARISON:  Renal  ultrasound 2 weeks ago 04/27/2021 FINDINGS: Right Kidney: Renal measurements: 11.1 x 4.9 x 5.1 cm = volume: 144 mL. Echogenicity within normal limits. No hydronephrosis. No visualized stone or focal lesion. Left Kidney: Renal measurements: 11.0 x 6.3 x 5.7 cm = volume: 2 9 mL. Echogenicity within normal limits. No hydronephrosis. No visualized stone or focal lesion. Bladder: Appears normal for degree of bladder distention. Other: Hepatic steatosis incidentally noted. IMPRESSION: Unremarkable sonographic appearance of the kidneys. Electronically Signed   By: Narda Rutherford M.D.   On: 05/08/2021 17:06   US RENAL  Result Date: 04/27/2021 CLINICAL DATA:  Acute kidney injury. EXAM: RENAL / URINARY TRACT ULTRASOUND COMPLETE COMPARISON:  10/22/2020 renal ultrasound. 06/21/2020 complete abdominal ultrasound. FINDINGS: Right Kidney: Renal measurements: 11.1 x 4.9 x 5.1 cm = volume: 143 mL. Echogenicity within normal limits. No mass or hydronephrosis visualized. Left Kidney: Renal measurements: 11.0 x 6.3 x 5.7 cm = volume: 209 mL. Echogenicity within normal limits. No mass or hydronephrosis visualized. Bladder: Appears normal for degree of bladder distention. Other: Diffusely increased parenchymal echogenicity of the visualized portion of the liver, also present on the prior abdominal ultrasound. IMPRESSION: 1. Unremarkable ultrasound appearance of the kidneys. 2. Hepatic steatosis. Electronically Signed   By: Sebastian Ache M.D.   On: 04/27/2021 19:36   DG Chest Port 1 View  Result Date: 05/08/2021 CLINICAL DATA:  45 year old male with history of cough and vomiting. EXAM: PORTABLE CHEST 1 VIEW COMPARISON:  Chest x-ray 04/27/2021. FINDINGS: Lung volumes are normal. No consolidative airspace disease. No pleural effusions. No pneumothorax. No pulmonary nodule or mass noted. Pulmonary vasculature and the cardiomediastinal silhouette are within normal limits. IMPRESSION: No radiographic evidence of acute cardiopulmonary  disease. Electronically Signed   By: Trudie Reed M.D.   On: 05/08/2021 08:43   DG Chest Portable 1 View  Result Date: 04/27/2021 CLINICAL DATA:  Patient called EMS out saying he was in someone's home yesterday with no A/C and he was concerned he got overheated. Patient drank Gatorade and Pedialyte yesterday but he has not been able to keep fluids down. Patient reporting he has gallstones and may possibly be having his gallbladder out.; hx HTN, PE; smoker EXAM: PORTABLE CHEST 1 VIEW COMPARISON:  06/18/2020 FINDINGS: Normal heart, mediastinum and hila. Lungs are clear. No convincing pleural effusion and no pneumothorax. Skeletal structures are grossly intact. IMPRESSION: No active disease. Electronically Signed   By: Amie Portland M.D.   On: 04/27/2021 12:50     Subjective: Patient was seen and examined at bedside.  Overnight events noted.  Patient reports feeling much improved, Nausea and vomiting has resolved. He wants to be discharged.  Discharge Exam: Vitals:   05/10/21 0412 05/10/21 0950  BP: 129/83 120/73  Pulse: (!) 58   Resp: 16   Temp: 98.4 F (36.9 C)   SpO2: 100%    Vitals:   05/09/21 1159 05/09/21 2030 05/10/21 0412 05/10/21 0950  BP: 134/84 117/78 129/83 120/73  Pulse: 70 71 (!) 58   Resp: 18  16   Temp: 97.6 F (36.4 C) 98.2 F (36.8 C) 98.4 F (36.9 C)  TempSrc: Oral Oral Oral   SpO2: 100% 100% 100%   Weight:      Height:        General: Pt is alert, awake, not in acute distress Cardiovascular: RRR, S1/S2 +, no rubs, no gallops Respiratory: CTA bilaterally, no wheezing, no rhonchi Abdominal: Soft, NT, ND, bowel sounds + Extremities: no edema, no cyanosis    The results of significant diagnostics from this hospitalization (including imaging, microbiology, ancillary and laboratory) are listed below for reference.     Microbiology: Recent Results (from the past 240 hour(s))  SARS CORONAVIRUS 2 (TAT 6-24 HRS) Nasopharyngeal Nasopharyngeal Swab      Status: None   Collection Time: 05/08/21  8:18 AM   Specimen: Nasopharyngeal Swab  Result Value Ref Range Status   SARS Coronavirus 2 NEGATIVE NEGATIVE Final    Comment: (NOTE) SARS-CoV-2 target nucleic acids are NOT DETECTED.  The SARS-CoV-2 RNA is generally detectable in upper and lower respiratory specimens during the acute phase of infection. Negative results do not preclude SARS-CoV-2 infection, do not rule out co-infections with other pathogens, and should not be used as the sole basis for treatment or other patient management decisions. Negative results must be combined with clinical observations, patient history, and epidemiological information. The expected result is Negative.  Fact Sheet for Patients: HairSlick.no  Fact Sheet for Healthcare Providers: quierodirigir.com  This test is not yet approved or cleared by the [place] FDA and  has been authorized for detection and/or diagnosis of SARS-CoV-2 by FDA under an Emergency Use Authorization (EUA). This EUA will remain  in effect (meaning this test can be used) for the duration of the COVID-19 declaration under Se ction 564(b)(1) of the Act, 21 U.S.C. section 360bbb-3(b)(1), unless the authorization is terminated or revoked sooner.  Performed at [facility], [address], [place], [place] [address]      Labs: BNP (last 3 results) No results for input(s): BNP in the last 8760 hours. Basic Metabolic Panel: Recent Labs  Lab 05/08/21 0818 05/08/21 1615 05/09/21 0337 05/10/21 0343  NA 127*  --  129* 128*  K 3.0*  --  3.1* 3.5  CL 85*  --  93* 97*  CO2 22  --  22 23  GLUCOSE 251*  --  113* 107*  BUN 16  --  17 11  CREATININE 2.63*  --  1.30* 0.86  CALCIUM 10.1  --  8.8* 8.5*  MG  --  2.2  --  1.7  PHOS  --   --   --  2.7   Liver Function Tests: Recent Labs  Lab 05/08/21 0818 05/09/21 0337  AST 34 32  ALT 23 21  ALKPHOS 88 69   BILITOT 0.7 0.7  PROT 9.9* 7.6  ALBUMIN 4.9 3.8   Recent Labs  Lab 05/08/21 0818  LIPASE 40   No results for input(s): AMMONIA in the last 168 hours. CBC: Recent Labs  Lab 05/08/21 0818 05/09/21 0337 05/10/21 0343  WBC 9.6 7.7 5.6  NEUTROABS 7.0  --   --   HGB 13.7 12.2* 11.0*  HCT 39.8 35.2* 32.3*  MCV 90.5 91.2 92.6  PLT 504* 392 364   Cardiac Enzymes: No results for input(s): CKTOTAL, CKMB, CKMBINDEX, TROPONINI in the last 168 hours. BNP: Invalid input(s): POCBNP CBG: Recent Labs  Lab 05/08/21 0758 05/08/21 1504  GLUCAP 261* 124*   D-Dimer No results for input(s): DDIMER in the last 72 hours. Hgb A1c No results for input(s): HGBA1C  in the last 72 hours. Lipid Profile No results for input(s): CHOL, HDL, LDLCALC, TRIG, CHOLHDL, LDLDIRECT in the last 72 hours. Thyroid function studies No results for input(s): TSH, T4TOTAL, T3FREE, THYROIDAB in the last 72 hours.  Invalid input(s): FREET3 Anemia work up No results for input(s): VITAMINB12, FOLATE, FERRITIN, TIBC, IRON, RETICCTPCT in the last 72 hours. Urinalysis    Component Value Date/Time   COLORURINE AMBER (A) 04/27/2021 1134   APPEARANCEUR HAZY (A) 04/27/2021 1134   LABSPEC 1.019 04/27/2021 1134   PHURINE 5.0 04/27/2021 1134   GLUCOSEU NEGATIVE 04/27/2021 1134   HGBUR NEGATIVE 04/27/2021 1134   BILIRUBINUR NEGATIVE 04/27/2021 1134   KETONESUR 5 (A) 04/27/2021 1134   PROTEINUR 30 (A) 04/27/2021 1134   UROBILINOGEN 1.0 07/29/2014 1837   NITRITE NEGATIVE 04/27/2021 1134   LEUKOCYTESUR NEGATIVE 04/27/2021 1134   Sepsis Labs Invalid input(s): PROCALCITONIN,  WBC,  LACTICIDVEN Microbiology Recent Results (from the past 240 hour(s))  SARS CORONAVIRUS 2 (TAT 6-24 HRS) Nasopharyngeal Nasopharyngeal Swab     Status: None   Collection Time: 05/08/21  8:18 AM   Specimen: Nasopharyngeal Swab  Result Value Ref Range Status   SARS Coronavirus 2 NEGATIVE NEGATIVE Final    Comment: (NOTE) SARS-CoV-2 target  nucleic acids are NOT DETECTED.  The SARS-CoV-2 RNA is generally detectable in upper and lower respiratory specimens during the acute phase of infection. Negative results do not preclude SARS-CoV-2 infection, do not rule out co-infections with other pathogens, and should not be used as the sole basis for treatment or other patient management decisions. Negative results must be combined with clinical observations, patient history, and epidemiological information. The expected result is Negative.  Fact Sheet for Patients: HairSlick.no  Fact Sheet for Healthcare Providers: quierodirigir.com  This test is not yet approved or cleared by the [place] FDA and  has been authorized for detection and/or diagnosis of SARS-CoV-2 by FDA under an Emergency Use Authorization (EUA). This EUA will remain  in effect (meaning this test can be used) for the duration of the COVID-19 declaration under Se ction 564(b)(1) of the Act, 21 U.S.C. section 360bbb-3(b)(1), unless the authorization is terminated or revoked sooner.  Performed at [facility], [address] [place], [place] [address]      Time coordinating discharge: Over 30 minutes  SIGNED:   Cipriano Bunker, MD  [facility] 05/10/2021, 11:19 AM   If 7PM-7AM, please contact night-coverage [email or web address]

## 2021-05-10 NOTE — Discharge Instructions (Signed)
Advised to refrain from marijuana use. Advised to take regular diet.

## 2021-05-13 ENCOUNTER — Telehealth: Payer: Self-pay

## 2021-05-13 NOTE — Telephone Encounter (Signed)
Transition Care Management Unsuccessful Follow-up Telephone Call  Date of discharge and from where:  05/10/2021, [facility]   Attempts:  1st Attempt  Reason for unsuccessful TCM follow-up call:  Unable to leave message, voicemail not set up # [phone].   Need to discuss scheduling a follow up appointment with PCP

## 2021-05-14 ENCOUNTER — Telehealth: Payer: Self-pay

## 2021-05-14 NOTE — Telephone Encounter (Signed)
Transition Care Management Unsuccessful Follow-up Telephone Call  Date of discharge and from where:  05/10/2021, [facility]   Attempts:  2nd Attempt  Reason for unsuccessful TCM follow-up call:  Unable to leave message - voicemail not set up on # [phone].     Need to discuss scheduling a follow up appointment with PCP

## 2021-05-15 ENCOUNTER — Telehealth: Payer: Self-pay

## 2021-05-15 NOTE — Telephone Encounter (Signed)
Transition Care Management Unsuccessful Follow-up Telephone Call  Date of discharge and from where:  05/10/2021, [facility]   Attempts:  3rd Attempt  Reason for unsuccessful TCM follow-up call:  Unable to leave message - voicemail not set up # [phone].  Letter sent to patient requesting he contact [facility] to schedule a hospital follow up appointment

## 2022-09-18 ENCOUNTER — Other Ambulatory Visit: Payer: Self-pay

## 2022-09-18 ENCOUNTER — Encounter (HOSPITAL_COMMUNITY): Payer: Self-pay

## 2022-09-18 ENCOUNTER — Emergency Department (HOSPITAL_COMMUNITY)
Admission: EM | Admit: 2022-09-18 | Discharge: 2022-09-19 | Disposition: A | Discharge status: Home / Self Care | Payer: Self-pay | Attending: Emergency Medicine | Admitting: Emergency Medicine

## 2022-09-18 DIAGNOSIS — Z79899 Other long term (current) drug therapy: Secondary | ICD-10-CM | POA: Insufficient documentation

## 2022-09-18 DIAGNOSIS — I10 Essential (primary) hypertension: Secondary | ICD-10-CM | POA: Insufficient documentation

## 2022-09-18 DIAGNOSIS — R000 Tachycardia, unspecified: Secondary | ICD-10-CM | POA: Insufficient documentation

## 2022-09-18 DIAGNOSIS — K2950 Unspecified chronic gastritis without bleeding: Secondary | ICD-10-CM | POA: Insufficient documentation

## 2022-09-18 DIAGNOSIS — E876 Hypokalemia: Secondary | ICD-10-CM | POA: Insufficient documentation

## 2022-09-18 NOTE — ED Triage Notes (Signed)
BIBA from home for hematemesis, vomited X 7, bright red and turned to dark red with spots in it, Lt side flank pain and headache, hx blood clots with same presentation, bilateral leg swelling, dehydrated.

## 2022-09-18 NOTE — ED Provider Notes (Signed)
[facility] DEPT Provider Note   CSN: [id number] Arrival date & time: 09/18/22  2343     History  Chief Complaint  Patient presents with   Hematemesis    Richard Lopez is a 46 y.o. male with history of GERD, gastritis, esophagitis, cannabis use/ cannabis hyperemesis syndrome, who presents with 3 days of nausea, vomiting, and epigastric pain x 3 days. Started today with 3 episodes of vomiting with small streaks of blood prompting ED visit. Also endorses darkening of urine x 1 day, fatigue, no PO intake x 72 hours. Patient endorses marijuana use over the last several days, which he states he uses primarily for chronic pain related to remote bilateral LE fracture. Endorses ETOH use x 3 beers/week on average.   I have personally reviewed his medical records.  Patient has history of PE, provoked following immobilization after bilateral lower extremity fractures in 2020.  Additionally is Hematemesis for which he underwent endoscopy in October 2020 which revealed esophagitis and gastritis.  States that he is on Prilosec daily at this time.  He states he is no longer anticoagulated.  In addition to the above listed history patient history of hypertension, diabetes, depression, and anxiety.  HPI     Home Medications Prior to Admission medications   Medication Sig Start Date End Date Taking? Authorizing Provider  pantoprazole (PROTONIX) 40 MG tablet Take 1 tablet (40 mg total) by mouth daily. 09/19/22 10/19/22 Yes Melesa Lecy R, PA-C  sucralfate (CARAFATE) 1 GM/10ML suspension Take 10 mLs (1 g total) by mouth 4 (four) times daily -  with meals and at bedtime. 09/19/22  Yes Modean Mccullum R, PA-C  amLODipine (NORVASC) 10 MG tablet Take 1 tablet (10 mg total) by mouth daily. 04/30/21   Shawna Clamp, MD      Allergies    Patient has no known allergies.    Review of Systems   Review of Systems  Constitutional:  Positive for appetite change, chills  and fatigue. Negative for fever.  HENT: Negative.    Respiratory: Negative.    Cardiovascular: Negative.   Gastrointestinal:  Positive for abdominal pain, nausea and vomiting. Negative for constipation and diarrhea.  Genitourinary: Negative.   Musculoskeletal: Negative.   Skin: Negative.   Neurological: Negative.     Physical Exam Updated Vital Signs BP (!) 173/103   Pulse (!) 102   Temp 98.4 F (36.9 C)   Resp 16   Ht 6\' 6"  (1.981 m)   Wt 108.9 kg   SpO2 99%   BMI 27.73 kg/m  Physical Exam Vitals and nursing note reviewed.  Constitutional:      Appearance: He is not ill-appearing or toxic-appearing.  HENT:     Head: Normocephalic and atraumatic.     Nose: Nose normal. No congestion.     Mouth/Throat:     Mouth: Mucous membranes are dry.     Dentition: Abnormal dentition.     Pharynx: Oropharynx is clear. Uvula midline. No oropharyngeal exudate or posterior oropharyngeal erythema.     Tonsils: No tonsillar exudate.  Eyes:     General: Lids are normal. Vision grossly intact.        Right eye: No discharge.        Left eye: No discharge.     Extraocular Movements: Extraocular movements intact.     Conjunctiva/sclera: Conjunctivae normal.     Pupils: Pupils are equal, round, and reactive to light.  Neck:     Trachea: Trachea and phonation normal.  Cardiovascular:     Rate and Rhythm: Regular rhythm. Tachycardia present.     Pulses: Normal pulses.     Heart sounds: Normal heart sounds. No murmur heard. Pulmonary:     Effort: Pulmonary effort is normal. No tachypnea, bradypnea, accessory muscle usage, prolonged expiration or respiratory distress.     Breath sounds: Normal breath sounds. No wheezing or rales.  Chest:     Chest wall: No mass, lacerations, deformity, swelling, tenderness, crepitus or edema.  Abdominal:     General: Bowel sounds are normal. There is no distension.     Palpations: Abdomen is soft.     Tenderness: There is no abdominal tenderness. There  is no right CVA tenderness, left CVA tenderness, guarding or rebound.  Musculoskeletal:        General: No deformity.     Cervical back: Normal range of motion and neck supple.     Right lower leg: No edema.     Left lower leg: No edema.  Lymphadenopathy:     Cervical: No cervical adenopathy.  Skin:    General: Skin is warm and dry.     Capillary Refill: Capillary refill takes less than 2 seconds.  Neurological:     General: No focal deficit present.     Mental Status: He is alert and oriented to person, place, and time. Mental status is at baseline.     GCS: GCS eye subscore is 4. GCS verbal subscore is 5. GCS motor subscore is 6.  Psychiatric:        Mood and Affect: Mood normal.     ED Results / Procedures / Treatments   Labs (all labs ordered are listed, but only abnormal results are displayed) Labs Reviewed  CBC WITH DIFFERENTIAL/PLATELET - Abnormal; Notable for the following components:      Result Value   RBC 3.96 (*)    Hemoglobin 12.1 (*)    HCT 35.2 (*)    RDW 15.6 (*)    All other components within normal limits  COMPREHENSIVE METABOLIC PANEL - Abnormal; Notable for the following components:   Potassium 3.3 (*)    Chloride 96 (*)    Glucose, Bld 116 (*)    AST 51 (*)    Total Bilirubin 1.3 (*)    Anion gap 17 (*)    All other components within normal limits  MAGNESIUM - Abnormal; Notable for the following components:   Magnesium 1.2 (*)    All other components within normal limits  RAPID URINE DRUG SCREEN, HOSP PERFORMED - Abnormal; Notable for the following components:   Tetrahydrocannabinol POSITIVE (*)    All other components within normal limits  CBG MONITORING, ED - Abnormal; Notable for the following components:   Glucose-Capillary 118 (*)    All other components within normal limits  LIPASE, BLOOD  ETHANOL  TYPE AND SCREEN  TROPONIN I (HIGH SENSITIVITY)  TROPONIN I (HIGH SENSITIVITY)    EKG None  Radiology US Abdomen Limited RUQ  (LIVER/GB)  Result Date: 09/19/2022 CLINICAL DATA:  Right upper quadrant pain EXAM: ULTRASOUND ABDOMEN LIMITED RIGHT UPPER QUADRANT COMPARISON:  06/21/2020 FINDINGS: Gallbladder: Full gallbladder with shadowing stones. No wall thickening or focal tenderness. Common bile duct: Diameter: 5 mm.  Where visualized, no filling defect. Liver: Diffusely echogenic liver with diminished acoustic penetration. Portal vein is patent on color Doppler imaging with normal direction of blood flow towards the liver. IMPRESSION: Cholelithiasis without findings of acute cholecystitis. Hepatic steatosis. Electronically Signed   By: Christiane Ha  Watts M.D.   On: 09/19/2022 05:03    Procedures Procedures   Medications Ordered in ED Medications  potassium chloride (KLOR-CON M) CR tablet 10 mEq (has no administration in time range)  magnesium oxide (MAG-OX) tablet 400 mg (has no administration in time range)  ondansetron (ZOFRAN) injection 4 mg (4 mg Intravenous Given 09/19/22 0030)  lactated ringers bolus 1,000 mL (0 mLs Intravenous Stopped 09/19/22 0222)  pantoprazole (PROTONIX) injection 40 mg (40 mg Intravenous Given 09/19/22 0031)  droperidol (INAPSINE) 2.5 MG/ML injection 2.5 mg (2.5 mg Intravenous Given 09/19/22 0205)  lactated ringers bolus 1,000 mL (0 mLs Intravenous Stopped 09/19/22 0339)  fentaNYL (SUBLIMAZE) injection 50 mcg (50 mcg Intravenous Given 09/19/22 0423)    ED Course/ Medical Decision Making/ A&P Clinical Course as of 09/19/22 0700  Fri Sep 19, 2022  0512 Reevaluated, resolution of nausea and tachycardia.  [RS]  [id number] Patient evaluated sleeping comfortably in his hospital bed.  He requires multiple verbal stimuli to wake.  Patient states he continues have some epigastric pain no appears far more comfortable than prior and has had complete resolution of his tachycardia at this time. [RS]  [id number] PO challenged successfully. Pain and nausea significantly improved.  [RS]    Clinical Course User  Index [RS] Faun Mcqueen, Gypsy Balsam, PA-C                           Medical Decision Making 46 year old male presents with 3 days of epigastric pain, nausea, vomiting now with small streaks of blood in his vomit.  Patient tachycardic on intake and tachypneic though dry heaving at time of my evaluation.  Hypertensive.  Cardiac exam at time my evaluation with mild tachycardia with heart rate of 108 with regular rhythm.  Pulmonary exam was unremarkable.  Patient with benign abdominal exam without any tenderness to palpation.  No CVAT.  Neurovascular tact extremities with dry mucous membranes and increased skin turgor.  Differential diagnosis includes notes to acute gastroenteritis, gastritis, esophagitis, biliary obstruction, cannabis hyperemesis syndrome.  We will proceed with IV antiemetic, Protonix and fluid bolus as well as laboratory studies and will reevaluate.  Amount and/or Complexity of Data Reviewed Labs: ordered.    Details: CBC without leukocytosis and with mild anemia with hemoglobin of 12 at patient's baseline.  CMP with mild hypokalemia of 3.3 repleted orally in the ED.  Mild elevation in total bilirubin 1.3 mild elevation in AST of 51.  Urine positive for THC.  Lipase is normal.  Mag mildly low at 1.2, repleted orally. Radiology: ordered.    Details: RUQ [place] with cholelithiasis.  Risk Prescription drug management.   Clinical picture most consistent with cannabinoid hyperemesis syndrome and patient's underlying gastritis.  Tolerating p.o. at this time following fluid resuscitation and IV antiemetics.  Tachycardia remains resolved at time of my repeat evaluation.  Clinical concern for emergent underlying etiology that warrant further degree of inpatient management is exceedingly low.  No further work-up warranted in the ER at this time.  Admission considered but felt not warranted at this time.  Richard Lopez  voiced understanding of his medical evaluation and treatment plan. Each of their  questions answered to their expressed satisfaction.  Return precautions were given.  Patient is well-appearing, stable, and was discharged in good condition.   This chart was dictated using voice recognition software, Dragon. Despite the best efforts of this provider to proofread and correct errors, errors may still occur which can change documentation meaning.  Final Clinical Impression(s) / ED Diagnoses Final diagnoses:  Chronic gastritis without bleeding, unspecified gastritis type    Rx / DC Orders ED Discharge Orders          Ordered    pantoprazole (PROTONIX) 40 MG tablet  Daily        09/19/22 0639    sucralfate (CARAFATE) 1 GM/10ML suspension  3 times daily with meals & bedtime        09/19/22 0639              Myria Steenbergen, Gypsy Balsam, PA-C 09/19/22 0700    Fatima Blank, MD 09/19/22 [phone]

## 2022-09-19 ENCOUNTER — Emergency Department (HOSPITAL_COMMUNITY): Payer: Self-pay

## 2022-09-19 LAB — ETHANOL: Alcohol, Ethyl (B): <10 mg/dL (ref <10)

## 2022-09-19 LAB — CBC WITH DIFFERENTIAL/PLATELET
Abs Immature Granulocytes: 0.04 10*3/uL (ref 0.00–0.07)
Basophils Absolute: 0 10*3/uL (ref 0.0–0.1)
Basophils Relative: 1 %
Eosinophils Absolute: 0.2 10*3/uL (ref 0.0–0.5)
Eosinophils Relative: 3 %
HCT: 35.2 % — ABNORMAL LOW (ref 39.0–52.0)
Hemoglobin: 12.1 g/dL — ABNORMAL LOW (ref 13.0–17.0)
Immature Granulocytes: 1 %
Lymphocytes Relative: 19 %
Lymphs Abs: 1.4 10*3/uL (ref 0.7–4.0)
MCH: 30.6 pg (ref 26.0–34.0)
MCHC: 34.4 g/dL (ref 30.0–36.0)
MCV: 88.9 fL (ref 80.0–100.0)
Monocytes Absolute: 1 10*3/uL (ref 0.1–1.0)
Monocytes Relative: 13 %
Neutro Abs: 4.8 10*3/uL (ref 1.7–7.7)
Neutrophils Relative %: 63 %
Platelets: 225 10*3/uL (ref 150–400)
RBC: 3.96 MIL/uL — ABNORMAL LOW (ref 4.22–5.81)
RDW: 15.6 % — ABNORMAL HIGH (ref 11.5–15.5)
WBC: 7.5 10*3/uL (ref 4.0–10.5)
nRBC: 0 % (ref 0.0–0.2)

## 2022-09-19 LAB — LIPASE, BLOOD: Lipase: 37 U/L (ref 11–51)

## 2022-09-19 LAB — COMPREHENSIVE METABOLIC PANEL
ALT: 42 U/L (ref 0–44)
AST: 51 U/L — ABNORMAL HIGH (ref 15–41)
Albumin: 3.7 g/dL (ref 3.5–5.0)
Alkaline Phosphatase: 58 U/L (ref 38–126)
Anion gap: 17 — ABNORMAL HIGH (ref 5–15)
BUN: 8 mg/dL (ref 6–20)
CO2: 23 mmol/L (ref 22–32)
Calcium: 9 mg/dL (ref 8.9–10.3)
Chloride: 96 mmol/L — ABNORMAL LOW (ref 98–111)
Creatinine, Ser: 0.96 mg/dL (ref 0.61–1.24)
GFR, Estimated: >60 mL/min (ref >60)
Glucose, Bld: 116 mg/dL — ABNORMAL HIGH (ref 70–99)
Potassium: 3.3 mmol/L — ABNORMAL LOW (ref 3.5–5.1)
Sodium: 136 mmol/L (ref 135–145)
Total Bilirubin: 1.3 mg/dL — ABNORMAL HIGH (ref 0.3–1.2)
Total Protein: 7.7 g/dL (ref 6.5–8.1)

## 2022-09-19 LAB — RAPID URINE DRUG SCREEN, HOSP PERFORMED
Amphetamines: NOT DETECTED
Barbiturates: NOT DETECTED
Benzodiazepines: NOT DETECTED
Cocaine: NOT DETECTED
Opiates: NOT DETECTED
Tetrahydrocannabinol: POSITIVE — AB

## 2022-09-19 LAB — TROPONIN I (HIGH SENSITIVITY)
Troponin I (High Sensitivity): 11 ng/L (ref <18)
Troponin I (High Sensitivity): 13 ng/L (ref <18)

## 2022-09-19 LAB — CBG MONITORING, ED: Glucose-Capillary: 118 mg/dL — ABNORMAL HIGH (ref 70–99)

## 2022-09-19 LAB — MAGNESIUM: Magnesium: 1.2 mg/dL — ABNORMAL LOW (ref 1.7–2.4)

## 2022-09-19 LAB — TYPE AND SCREEN
ABO/RH(D): O POS
Antibody Screen: NEGATIVE

## 2022-09-19 MED ORDER — FENTANYL CITRATE PF 50 MCG/ML IJ SOSY
50.0000 ug | PREFILLED_SYRINGE | Freq: Once | INTRAMUSCULAR | Status: AC
  Administered 2022-09-19: 50 ug via INTRAVENOUS
  Filled 2022-09-19: qty 1

## 2022-09-19 MED ORDER — MAGNESIUM SULFATE 50 % IJ SOLN: 1.0000 g | Freq: Once | INTRAMUSCULAR | Status: DC

## 2022-09-19 MED ORDER — PANTOPRAZOLE SODIUM 40 MG PO TBEC: 40.0000 mg | DELAYED_RELEASE_TABLET | Freq: Every day | ORAL | 0 refills | Status: DC

## 2022-09-19 MED ORDER — ONDANSETRON HCL 4 MG/2ML IJ SOLN
4.0000 mg | Freq: Once | INTRAMUSCULAR | Status: AC
  Administered 2022-09-19: 4 mg via INTRAVENOUS
  Filled 2022-09-19: qty 2

## 2022-09-19 MED ORDER — SUCRALFATE 1 GM/10ML PO SUSP: 1.0000 g | Freq: Three times a day (TID) | ORAL | 0 refills | Status: DC

## 2022-09-19 MED ORDER — DROPERIDOL 2.5 MG/ML IJ SOLN
2.5000 mg | Freq: Once | INTRAMUSCULAR | Status: AC
  Administered 2022-09-19: 2.5 mg via INTRAVENOUS
  Filled 2022-09-19: qty 2

## 2022-09-19 MED ORDER — MAGNESIUM OXIDE -MG SUPPLEMENT 400 (240 MG) MG PO TABS
400.0000 mg | ORAL_TABLET | Freq: Once | ORAL | Status: AC
  Administered 2022-09-19: 400 mg via ORAL
  Filled 2022-09-19: qty 1

## 2022-09-19 MED ORDER — POTASSIUM CHLORIDE CRYS ER 10 MEQ PO TBCR
10.0000 meq | EXTENDED_RELEASE_TABLET | Freq: Once | ORAL | Status: AC
  Administered 2022-09-19: 10 meq via ORAL
  Filled 2022-09-19: qty 1

## 2022-09-19 MED ORDER — PANTOPRAZOLE SODIUM 40 MG IV SOLR
40.0000 mg | Freq: Once | INTRAVENOUS | Status: AC
  Administered 2022-09-19: 40 mg via INTRAVENOUS
  Filled 2022-09-19: qty 10

## 2022-09-19 MED ORDER — LACTATED RINGERS IV BOLUS
1000.0000 mL | Freq: Once | INTRAVENOUS | Status: AC
  Administered 2022-09-19: 1000 mL via INTRAVENOUS

## 2022-09-19 NOTE — Discharge Instructions (Addendum)
Please take the prescribed dosage of Protonix daily for the next month, you may use the prescribed Carafate which is a medication that can help with the epigastric pain you are experiencing.  Please follow-up with your primary care doctor and return to the ER with any severe symptoms.

## 2022-11-08 ENCOUNTER — Inpatient Hospital Stay (HOSPITAL_COMMUNITY): Payer: Medicaid Other

## 2022-11-08 ENCOUNTER — Emergency Department (HOSPITAL_COMMUNITY): Payer: Medicaid Other

## 2022-11-08 ENCOUNTER — Inpatient Hospital Stay (HOSPITAL_COMMUNITY)
Admission: EM | Admit: 2022-11-08 | Discharge: 2022-11-17 | DRG: 381 | Disposition: A | Discharge status: Home / Self Care | Payer: Medicaid Other | Attending: Internal Medicine | Admitting: Internal Medicine

## 2022-11-08 ENCOUNTER — Other Ambulatory Visit: Payer: Self-pay

## 2022-11-08 DIAGNOSIS — R7989 Other specified abnormal findings of blood chemistry: Secondary | ICD-10-CM | POA: Insufficient documentation

## 2022-11-08 DIAGNOSIS — K920 Hematemesis: Secondary | ICD-10-CM | POA: Diagnosis present

## 2022-11-08 DIAGNOSIS — Z87891 Personal history of nicotine dependence: Secondary | ICD-10-CM | POA: Diagnosis not present

## 2022-11-08 DIAGNOSIS — E871 Hypo-osmolality and hyponatremia: Secondary | ICD-10-CM | POA: Diagnosis present

## 2022-11-08 DIAGNOSIS — Z6826 Body mass index (BMI) 26.0-26.9, adult: Secondary | ICD-10-CM | POA: Diagnosis not present

## 2022-11-08 DIAGNOSIS — E872 Acidosis, unspecified: Secondary | ICD-10-CM | POA: Diagnosis present

## 2022-11-08 DIAGNOSIS — N179 Acute kidney failure, unspecified: Secondary | ICD-10-CM | POA: Diagnosis present

## 2022-11-08 DIAGNOSIS — K760 Fatty (change of) liver, not elsewhere classified: Secondary | ICD-10-CM | POA: Diagnosis present

## 2022-11-08 DIAGNOSIS — Z86711 Personal history of pulmonary embolism: Secondary | ICD-10-CM

## 2022-11-08 DIAGNOSIS — E119 Type 2 diabetes mellitus without complications: Secondary | ICD-10-CM | POA: Diagnosis present

## 2022-11-08 DIAGNOSIS — M109 Gout, unspecified: Secondary | ICD-10-CM | POA: Diagnosis present

## 2022-11-08 DIAGNOSIS — K219 Gastro-esophageal reflux disease without esophagitis: Secondary | ICD-10-CM | POA: Diagnosis present

## 2022-11-08 DIAGNOSIS — R748 Abnormal levels of other serum enzymes: Secondary | ICD-10-CM | POA: Diagnosis not present

## 2022-11-08 DIAGNOSIS — I10 Essential (primary) hypertension: Secondary | ICD-10-CM | POA: Diagnosis not present

## 2022-11-08 DIAGNOSIS — K319 Disease of stomach and duodenum, unspecified: Secondary | ICD-10-CM | POA: Diagnosis present

## 2022-11-08 DIAGNOSIS — K3189 Other diseases of stomach and duodenum: Secondary | ICD-10-CM | POA: Diagnosis not present

## 2022-11-08 DIAGNOSIS — F419 Anxiety disorder, unspecified: Secondary | ICD-10-CM | POA: Diagnosis present

## 2022-11-08 DIAGNOSIS — R112 Nausea with vomiting, unspecified: Secondary | ICD-10-CM | POA: Diagnosis present

## 2022-11-08 DIAGNOSIS — F32A Depression, unspecified: Secondary | ICD-10-CM | POA: Diagnosis present

## 2022-11-08 DIAGNOSIS — E663 Overweight: Secondary | ICD-10-CM | POA: Diagnosis present

## 2022-11-08 DIAGNOSIS — K2289 Other specified disease of esophagus: Secondary | ICD-10-CM | POA: Diagnosis not present

## 2022-11-08 DIAGNOSIS — E8729 Other acidosis: Secondary | ICD-10-CM | POA: Diagnosis not present

## 2022-11-08 DIAGNOSIS — K208 Other esophagitis without bleeding: Secondary | ICD-10-CM | POA: Diagnosis not present

## 2022-11-08 DIAGNOSIS — K2211 Ulcer of esophagus with bleeding: Secondary | ICD-10-CM | POA: Diagnosis present

## 2022-11-08 DIAGNOSIS — R1310 Dysphagia, unspecified: Secondary | ICD-10-CM | POA: Diagnosis present

## 2022-11-08 DIAGNOSIS — F1210 Cannabis abuse, uncomplicated: Secondary | ICD-10-CM | POA: Diagnosis present

## 2022-11-08 DIAGNOSIS — K2210 Ulcer of esophagus without bleeding: Secondary | ICD-10-CM | POA: Diagnosis not present

## 2022-11-08 DIAGNOSIS — E876 Hypokalemia: Secondary | ICD-10-CM | POA: Diagnosis present

## 2022-11-08 DIAGNOSIS — E860 Dehydration: Secondary | ICD-10-CM | POA: Diagnosis present

## 2022-11-08 DIAGNOSIS — M1990 Unspecified osteoarthritis, unspecified site: Secondary | ICD-10-CM | POA: Diagnosis present

## 2022-11-08 DIAGNOSIS — Z1152 Encounter for screening for COVID-19: Secondary | ICD-10-CM | POA: Diagnosis not present

## 2022-11-08 DIAGNOSIS — Z833 Family history of diabetes mellitus: Secondary | ICD-10-CM

## 2022-11-08 DIAGNOSIS — Z8249 Family history of ischemic heart disease and other diseases of the circulatory system: Secondary | ICD-10-CM

## 2022-11-08 LAB — URINALYSIS, ROUTINE W REFLEX MICROSCOPIC
Bacteria, UA: NONE SEEN
Glucose, UA: NEGATIVE mg/dL
Ketones, ur: 5 mg/dL — AB
Nitrite: NEGATIVE
Protein, ur: >=300 mg/dL — AB
Specific Gravity, Urine: 1.025 (ref 1.005–1.030)
pH: 5 (ref 5.0–8.0)

## 2022-11-08 LAB — CBC
HCT: 40.8 % (ref 39.0–52.0)
Hemoglobin: 14.4 g/dL (ref 13.0–17.0)
MCH: 29.5 pg (ref 26.0–34.0)
MCHC: 35.3 g/dL (ref 30.0–36.0)
MCV: 83.6 fL (ref 80.0–100.0)
Platelets: 177 10*3/uL (ref 150–400)
RBC: 4.88 MIL/uL (ref 4.22–5.81)
RDW: 15.2 % (ref 11.5–15.5)
WBC: 13.7 10*3/uL — ABNORMAL HIGH (ref 4.0–10.5)
nRBC: 0 % (ref 0.0–0.2)

## 2022-11-08 LAB — COMPREHENSIVE METABOLIC PANEL
ALT: 21 U/L (ref 0–44)
AST: 50 U/L — ABNORMAL HIGH (ref 15–41)
Albumin: 5.2 g/dL — ABNORMAL HIGH (ref 3.5–5.0)
Alkaline Phosphatase: 78 U/L (ref 38–126)
Anion gap: >20 — ABNORMAL HIGH (ref 5–15)
BUN: 24 mg/dL — ABNORMAL HIGH (ref 6–20)
CO2: 27 mmol/L (ref 22–32)
Calcium: 9.3 mg/dL (ref 8.9–10.3)
Chloride: 76 mmol/L — ABNORMAL LOW (ref 98–111)
Creatinine, Ser: 3.17 mg/dL — ABNORMAL HIGH (ref 0.61–1.24)
GFR, Estimated: 24 mL/min — ABNORMAL LOW (ref >60)
Glucose, Bld: 233 mg/dL — ABNORMAL HIGH (ref 70–99)
Potassium: 3.2 mmol/L — ABNORMAL LOW (ref 3.5–5.1)
Sodium: 124 mmol/L — ABNORMAL LOW (ref 135–145)
Total Bilirubin: 1.4 mg/dL — ABNORMAL HIGH (ref 0.3–1.2)
Total Protein: 9.6 g/dL — ABNORMAL HIGH (ref 6.5–8.1)

## 2022-11-08 LAB — RAPID URINE DRUG SCREEN, HOSP PERFORMED
Amphetamines: NOT DETECTED
Barbiturates: NOT DETECTED
Benzodiazepines: NOT DETECTED
Cocaine: NOT DETECTED
Opiates: NOT DETECTED
Tetrahydrocannabinol: POSITIVE — AB

## 2022-11-08 LAB — TYPE AND SCREEN
ABO/RH(D): O POS
Antibody Screen: NEGATIVE

## 2022-11-08 LAB — CK: Total CK: 521 U/L — ABNORMAL HIGH (ref 49–397)

## 2022-11-08 LAB — MAGNESIUM: Magnesium: 1.4 mg/dL — ABNORMAL LOW (ref 1.7–2.4)

## 2022-11-08 LAB — LACTIC ACID, PLASMA: Lactic Acid, Venous: 1.3 mmol/L (ref 0.5–1.9)

## 2022-11-08 LAB — CBG MONITORING, ED: Glucose-Capillary: 250 mg/dL — ABNORMAL HIGH (ref 70–99)

## 2022-11-08 LAB — SODIUM, URINE, RANDOM: Sodium, Ur: <10 mmol/L

## 2022-11-08 LAB — LIPASE, BLOOD: Lipase: 38 U/L (ref 11–51)

## 2022-11-08 MED ORDER — POTASSIUM CHLORIDE 10 MEQ/100ML IV SOLN
10.0000 meq | INTRAVENOUS | Status: AC
  Administered 2022-11-08 (×4): 10 meq via INTRAVENOUS
  Filled 2022-11-08 (×4): qty 100

## 2022-11-08 MED ORDER — PROCHLORPERAZINE EDISYLATE 10 MG/2ML IJ SOLN
10.0000 mg | Freq: Four times a day (QID) | INTRAMUSCULAR | Status: DC | PRN
  Administered 2022-11-08: 10 mg via INTRAVENOUS
  Filled 2022-11-08: qty 2

## 2022-11-08 MED ORDER — SODIUM CHLORIDE 0.9 % IV BOLUS
1000.0000 mL | Freq: Once | INTRAVENOUS | Status: AC
  Administered 2022-11-08: 1000 mL via INTRAVENOUS

## 2022-11-08 MED ORDER — ONDANSETRON HCL 4 MG/2ML IJ SOLN
4.0000 mg | Freq: Once | INTRAMUSCULAR | Status: AC
  Administered 2022-11-08: 4 mg via INTRAVENOUS
  Filled 2022-11-08: qty 2

## 2022-11-08 MED ORDER — SODIUM CHLORIDE 0.9 % IV SOLN: INTRAVENOUS | Status: DC

## 2022-11-08 MED ORDER — MAGNESIUM SULFATE 2 GM/50ML IV SOLN
2.0000 g | Freq: Once | INTRAVENOUS | Status: AC
  Administered 2022-11-08: 2 g via INTRAVENOUS
  Filled 2022-11-08: qty 50

## 2022-11-08 MED ORDER — ONDANSETRON HCL 4 MG/2ML IJ SOLN
4.0000 mg | Freq: Once | INTRAMUSCULAR | Status: AC | PRN
  Administered 2022-11-08: 4 mg via INTRAVENOUS
  Filled 2022-11-08: qty 2

## 2022-11-08 MED ORDER — LORAZEPAM 2 MG/ML IJ SOLN
0.5000 mg | Freq: Once | INTRAMUSCULAR | Status: AC
  Administered 2022-11-08: 0.5 mg via INTRAVENOUS
  Filled 2022-11-08: qty 1

## 2022-11-08 MED ORDER — AMLODIPINE BESYLATE 10 MG PO TABS
10.0000 mg | ORAL_TABLET | Freq: Every day | ORAL | Status: DC
  Administered 2022-11-08 – 2022-11-17 (×10): 10 mg via ORAL
  Filled 2022-11-08 (×2): qty 1
  Filled 2022-11-08: qty 2
  Filled 2022-11-08 (×7): qty 1

## 2022-11-08 NOTE — H&P (Signed)
History and Physical    Patient: Richard Lopez OIN:[id number] DOB: [birth year] DOA: 11/08/2022 DOS: the patient was seen and examined on 11/08/2022 PCP: [name], NP  Patient coming from: Home  Chief Complaint:  Chief Complaint  Patient presents with   Emesis   Weakness   HPI: Richard Lopez is a 47 y.o. male with medical history significant of mj abuse, HTN, GERD. Presenting with N/V. His symptoms started 4 days ago. He's had poor PO intake since that time. He says he feels dehydrated. He's had crampy whole body pain over the last couple of days. He reports that he's had some streaky blood in a couple of his vomiting episodes, but none over the last 24 hours. He denies any fevers or sick contacts. He reports his last marijuana use was 5 days ago. When his symptoms did not improve today, he decided to come to the ED for evaluation. He denies any other aggravating or alleviating factors.   Review of Systems: As mentioned in the history of present illness. All other systems reviewed and are negative. Past Medical History:  Diagnosis Date   Anxiety    Arthritis    hands and possibly knee   Depression    Diabetes mellitus    diet controlled   Essential hypertension    Gastritis    Gout    Normocytic anemia due to blood loss 08/21/2019   Pulmonary embolism [facility])    Past Surgical History:  Procedure Laterality Date   ESOPHAGOGASTRODUODENOSCOPY (EGD) WITH PROPOFOL N/A 08/14/2019   Procedure: ESOPHAGOGASTRODUODENOSCOPY (EGD) WITH PROPOFOL;  Surgeon: Charlott Rakes, MD;  Location: [facility] ENDOSCOPY;  Service: Endoscopy;  Laterality: N/A;   EXTERNAL FIXATION LEG Left 11/07/2018   Procedure: EXTERNAL FIXATION LEFT LOWER LEG;  Surgeon: Samson Frederic, MD;  Location: [facility] ORS;  Service: Orthopedics;  Laterality: Left;   EXTERNAL FIXATION REMOVAL Left 11/08/2018   Procedure: REMOVAL EXTERNAL FIXATION LEG;  Surgeon: Roby Lofts, MD;  Location: [facility] OR;  Service: Orthopedics;  Laterality: Left;    INTRAMEDULLARY (IM) NAIL INTERTROCHANTERIC Right 03/26/2019   Procedure: INTRAMEDULLARY (IM) NAIL INTERTROCHANTRIC;  Surgeon: Roby Lofts, MD;  Location: [facility] OR;  Service: Orthopedics;  Laterality: Right;   INTRAMEDULLARY (IM) NAIL INTERTROCHANTERIC Right 05/17/2019   Procedure: Intramedullary (Im) Nail Intertroch with circlage wiring;  Surgeon: Myrene Galas, MD;  Location: [facility] OR;  Service: Orthopedics;  Laterality: Right;   NO PAST SURGERIES     OPEN REDUCTION INTERNAL FIXATION (ORIF) TIBIA/FIBULA FRACTURE Left 11/08/2018   Procedure: OPEN REDUCTION INTERNAL FIXATION (ORIF) TIBIA/FIBULA FRACTURE;  Surgeon: Roby Lofts, MD;  Location: [facility] OR;  Service: Orthopedics;  Laterality: Left;   ORIF FEMUR FRACTURE Right 05/17/2019   Procedure: REMOVAL  OF HARDWARE;  Surgeon: Myrene Galas, MD;  Location: [facility] OR;  Service: Orthopedics;  Laterality: Right;   Social History:  reports that he has quit smoking. His smoking use included cigarettes. He has never used smokeless tobacco. He reports current alcohol use of about 200.0 standard drinks of alcohol per week. He reports current drug use. Drug: Marijuana.  No Known Allergies  Family History  Problem Relation Age of Onset   Diabetes Mellitus II Father    Diabetes Mellitus II Other    CAD Other     Prior to Admission medications   Medication Sig Start Date End Date Taking? Authorizing Provider  acetaminophen (TYLENOL) 500 MG tablet Take 500 mg by mouth every 6 (six) hours as needed for mild pain.  Yes [provider]  cetirizine (ZYRTEC) 10 MG tablet Take 10 mg by mouth daily as needed for allergies.   Yes [provider]  amLODipine (NORVASC) 10 MG tablet Take 1 tablet (10 mg total) by mouth daily. Patient not taking: Reported on 11/08/2022 04/30/21   Cipriano Bunker, MD  pantoprazole (PROTONIX) 40 MG tablet Take 1 tablet (40 mg total) by mouth daily. Patient not taking: Reported on 11/08/2022 09/19/22 10/19/22  Sponseller,  Lupe Carney R, PA-C  sucralfate (CARAFATE) 1 GM/10ML suspension Take 10 mLs (1 g total) by mouth 4 (four) times daily -  with meals and at bedtime. Patient not taking: Reported on 11/08/2022 09/19/22   Paris Lore, PA-C    Physical Exam: Vitals:   11/08/22 1115 11/08/22 1130 11/08/22 1200 11/08/22 1245  BP: (!) 163/100 (!) 158/100 (!) 149/102 (!) 189/109  Pulse: (!) 104 61 (!) 58 (!) 39  Resp: (!) 23 20 (!) 23 20  Temp:      TempSrc:      SpO2: 96% 98% 98% 100%   General: 47 y.o. male resting in bed in NAD Eyes: PERRL, normal sclera ENMT: Nares patent w/o discharge, orophaynx clear, dentition normal, ears w/o discharge/lesions/ulcers, mucous membrains dry Neck: Supple, trachea midline Cardiovascular: tachy, +S1, S2, no m/g/r, equal pulses throughout Respiratory: CTABL, no w/r/r, normal WOB GI: BS+, NDNT, no masses noted, no organomegaly noted MSK: No e/c/c Neuro: A&O x 3, no focal deficits Psyc: Appropriate interaction and affect, calm/cooperative  Data Reviewed:  Results for orders placed or performed during the hospital encounter of 11/08/22 (from the past 24 hour(s))  CBG monitoring, ED     Status: Abnormal   Collection Time: 11/08/22 10:29 AM  Result Value Ref Range   Glucose-Capillary 250 (H) 70 - 99 mg/dL  Type and screen [facility]     Status: None   Collection Time: 11/08/22 10:38 AM  Result Value Ref Range   ABO/RH(D) O POS    Antibody Screen NEG    Sample Expiration      11/11/2022,2359 Performed at [facility], [address], [place], [place] [address]   Lipase, blood     Status: None   Collection Time: 11/08/22 10:49 AM  Result Value Ref Range   Lipase 38 11 - 51 U/L  Comprehensive metabolic panel     Status: Abnormal   Collection Time: 11/08/22 10:49 AM  Result Value Ref Range   Sodium 124 (L) 135 - 145 mmol/L   Potassium 3.2 (L) 3.5 - 5.1 mmol/L   Chloride 76 (L) 98 - 111 mmol/L   CO2 27 22 - 32 mmol/L    Glucose, Bld 233 (H) 70 - 99 mg/dL   BUN 24 (H) 6 - 20 mg/dL   Creatinine, Ser 6.04 (H) 0.61 - 1.24 mg/dL   Calcium 9.3 8.9 - 54.0 mg/dL   Total Protein 9.6 (H) 6.5 - 8.1 g/dL   Albumin 5.2 (H) 3.5 - 5.0 g/dL   AST 50 (H) 15 - 41 U/L   ALT 21 0 - 44 U/L   Alkaline Phosphatase 78 38 - 126 U/L   Total Bilirubin 1.4 (H) 0.3 - 1.2 mg/dL   GFR, Estimated 24 (L) >60 mL/min   Anion gap >20 (H) 5 - 15  CBC     Status: Abnormal   Collection Time: 11/08/22 10:49 AM  Result Value Ref Range   WBC 13.7 (H) 4.0 - 10.5 K/uL   RBC 4.88 4.22 -  5.81 MIL/uL   Hemoglobin 14.4 13.0 - 17.0 g/dL   HCT 40.8 39.0 - 52.0 %   MCV 83.6 80.0 - 100.0 fL   MCH 29.5 26.0 - 34.0 pg   MCHC 35.3 30.0 - 36.0 g/dL   RDW 15.2 11.5 - 15.5 %   Platelets 177 150 - 400 K/uL   nRBC 0.0 0.0 - 0.2 %  CK     Status: Abnormal   Collection Time: 11/08/22 10:49 AM  Result Value Ref Range   Total CK 521 (H) 49 - 397 U/L  Magnesium     Status: Abnormal   Collection Time: 11/08/22 10:49 AM  Result Value Ref Range   Magnesium 1.4 (L) 1.7 - 2.4 mg/dL   CXR: No active disease.   EKG: atrial tach, no st elevations  Assessment and Plan: N/V     - admit to inpt, progressive     - continue fluids, anti-emetics     - CLD, advance as tolerated  AKI Dehydration High anion gap acidosis     - [place] ab complete     - watch nephrotoxins     - check lactic acid     - fluids  Hyponatremia Hypokalemia Hypomagnesemia     - replace K+, Mg2+     - fluids, check UA, Uosm, Una+     - q6h renal function panels; limit Na+ rise to 8 - 10pts/day  Elevated LFTs     - [place] ab complete     - check hepatitis panel  Elevated CK     - fluids, trend  Marijuana abuse     - counsel against further use  Advance Care Planning:   Code Status: FULL  Consults: None  Family Communication: None at bedside  Severity of Illness: The appropriate patient status for this patient is INPATIENT. Inpatient status is judged to be reasonable and  necessary in order to provide the required intensity of service to ensure the patient's safety. The patient's presenting symptoms, physical exam findings, and initial radiographic and laboratory data in the context of their chronic comorbidities is felt to place them at high risk for further clinical deterioration. Furthermore, it is not anticipated that the patient will be medically stable for discharge from the hospital within 2 midnights of admission.   * I certify that at the point of admission it is my clinical judgment that the patient will require inpatient hospital care spanning beyond 2 midnights from the point of admission due to high intensity of service, high risk for further deterioration and high frequency of surveillance required.*  Author: Jonnie Finner, DO 11/08/2022 1:11 PM  For on call review [email or web address].

## 2022-11-08 NOTE — ED Provider Notes (Signed)
[facility]-EMERGENCY DEPT Provider Note   CSN: [id number] Arrival date & time: 11/08/22  1014     History  Chief Complaint  Patient presents with   Emesis   Weakness    Richard Lopez is a 47 y.o. male.  47 year old male with prior medical history as detailed below presents for evaluation.  Patient reports significant nausea and vomiting for the last 4 to 5 days.  Patient reports that he has not made any urine since Wednesday.  Patient reports multiple rounds of vomiting throughout every day.  He denies fever.  He denies specific pain.  He complains of crampy pain throughout all of his body.  He denies bloody stool.  He reports that approximately 3 days ago he noticed some bright red streaks in some of his vomit.  There has been no further bleeding despite the fact that he has had multiple rounds of emesis since.  He did not take anything at home for his symptoms.  He thinks that his last use of marijuana was approximately a week and a half ago.  The history is provided by the patient and medical records.       Home Medications Prior to Admission medications   Medication Sig Start Date End Date Taking? Authorizing Provider  amLODipine (NORVASC) 10 MG tablet Take 1 tablet (10 mg total) by mouth daily. 04/30/21   Cipriano Bunker, MD  pantoprazole (PROTONIX) 40 MG tablet Take 1 tablet (40 mg total) by mouth daily. 09/19/22 10/19/22  Sponseller, Lupe Carney R, PA-C  sucralfate (CARAFATE) 1 GM/10ML suspension Take 10 mLs (1 g total) by mouth 4 (four) times daily -  with meals and at bedtime. 09/19/22   Sponseller, Eugene Gavia, PA-C      Allergies    Patient has no known allergies.    Review of Systems   Review of Systems  All other systems reviewed and are negative.   Physical Exam Updated Vital Signs BP (!) 189/149 (BP Location: Right Arm)   Pulse 73   Temp 97.7 F (36.5 C) (Oral)   Resp 16   SpO2 98%  Physical Exam Vitals and nursing note reviewed.   Constitutional:      General: He is not in acute distress.    Appearance: Normal appearance. He is well-developed.  HENT:     Head: Normocephalic and atraumatic.     Mouth/Throat:     Mouth: Mucous membranes are dry.  Eyes:     Conjunctiva/sclera: Conjunctivae normal.     Pupils: Pupils are equal, round, and reactive to light.  Cardiovascular:     Rate and Rhythm: Regular rhythm. Tachycardia present.     Heart sounds: Normal heart sounds.  Pulmonary:     Effort: Pulmonary effort is normal. No respiratory distress.     Breath sounds: Normal breath sounds.  Abdominal:     General: There is no distension.     Palpations: Abdomen is soft.     Tenderness: There is no abdominal tenderness.  Musculoskeletal:        General: No deformity. Normal range of motion.     Cervical back: Normal range of motion and neck supple.  Skin:    General: Skin is warm and dry.  Neurological:     General: No focal deficit present.     Mental Status: He is alert and oriented to person, place, and time.     ED Results / Procedures / Treatments   Labs (all labs ordered are listed,  but only abnormal results are displayed) Labs Reviewed  CBG MONITORING, ED - Abnormal; Notable for the following components:      Result Value   Glucose-Capillary 250 (*)    All other components within normal limits  LIPASE, BLOOD  COMPREHENSIVE METABOLIC PANEL  CBC  URINALYSIS, ROUTINE W REFLEX MICROSCOPIC  CK  RAPID URINE DRUG SCREEN, HOSP PERFORMED  TYPE AND SCREEN    EKG EKG Interpretation  Date/Time:  Saturday November 08 2022 10:24:00 EST Ventricular Rate:  125 PR Interval:  106 QRS Duration: 97 QT Interval:  359 QTC Calculation: 477 R Axis:   65 Text Interpretation: Ectopic atrial tachycardia, unifocal Multiform ventricular premature complexes Repol abnrm suggests ischemia, diffuse leads Confirmed by Dene Gentry [id number]) on 11/08/2022 10:34:12 AM  Radiology No results found.  Procedures Procedures     Medications Ordered in ED Medications  ondansetron (ZOFRAN) injection 4 mg (has no administration in time range)  sodium chloride 0.9 % bolus 1,000 mL (has no administration in time range)    ED Course/ Medical Decision Making/ A&P                           Medical Decision Making Amount and/or Complexity of Data Reviewed Labs: ordered. Radiology: ordered.  Risk Prescription drug management.    Medical Screen Complete  This patient presented to the ED with complaint of nausea, vomiting, dehydration.  This complaint involves an extensive number of treatment options. The initial differential diagnosis includes, but is not limited to, AKI, metabolic abnormality, etc.  This presentation is: Acute, Self-Limited, Previously Undiagnosed, Uncertain Prognosis, Complicated, Systemic Symptoms, and Threat to Life/Bodily Function   Patient is presenting with reported constant nausea and vomiting for approximately 4 days.  Patient reports that he has not made any urine since 3 days ago.  Patient is clinically dehydrated on exam.  Labs are significant for elevated BUN and creatinine.  Potassium is 3.2.  White count of 13.7.  Hemoglobin is stable at 14.  Patient does have apparently have a history of marijuana associated nausea and vomiting. Patient will require admission.    Hospitalist service made aware of case and will evaluate for same.   Additional history obtained: External records from outside sources obtained and reviewed including prior ED visits and prior Inpatient records.    Lab Tests:  I ordered and personally interpreted labs.  The pertinent results include: CBC, CMP, lipase   Imaging Studies ordered:  I ordered imaging studies including plain film of chest I independently visualized and interpreted obtained imaging which showed NAD I agree with the radiologist interpretation.   Cardiac Monitoring:  The patient was maintained on a cardiac monitor.  I  personally viewed and interpreted the cardiac monitor which showed an underlying rhythm of: Sinus tach   Medicines ordered:  I ordered medication including IV fluids, antiemetics for dehydration Reevaluation of the patient after these medicines showed that the patient: improved    Problem List / ED Course:  Nausea, vomiting, AKI   Reevaluation:  After the interventions noted above, I reevaluated the patient and found that they have: improved  Disposition:  After consideration of the diagnostic results and the patients response to treatment, I feel that the patent would benefit from admission.          Final Clinical Impression(s) / ED Diagnoses Final diagnoses:  Dehydration    Rx / DC Orders ED Discharge Orders     None  Wynetta Fines, MD 11/08/22 1239

## 2022-11-08 NOTE — ED Triage Notes (Addendum)
Pt BIBA from home. C/o N/V for 3x days. Has not been able to keep most fluids down. Experienced severe muscle cramping this am.  Some bright red blood in emesis.  No diarrhea.   AOx4

## 2022-11-09 ENCOUNTER — Encounter (HOSPITAL_COMMUNITY): Payer: Self-pay | Admitting: Internal Medicine

## 2022-11-09 DIAGNOSIS — E876 Hypokalemia: Secondary | ICD-10-CM

## 2022-11-09 DIAGNOSIS — E871 Hypo-osmolality and hyponatremia: Secondary | ICD-10-CM

## 2022-11-09 DIAGNOSIS — F1210 Cannabis abuse, uncomplicated: Secondary | ICD-10-CM

## 2022-11-09 DIAGNOSIS — E860 Dehydration: Secondary | ICD-10-CM

## 2022-11-09 DIAGNOSIS — N179 Acute kidney failure, unspecified: Secondary | ICD-10-CM

## 2022-11-09 DIAGNOSIS — R7989 Other specified abnormal findings of blood chemistry: Secondary | ICD-10-CM

## 2022-11-09 DIAGNOSIS — I10 Essential (primary) hypertension: Secondary | ICD-10-CM

## 2022-11-09 LAB — CBC
HCT: 34 % — ABNORMAL LOW (ref 39.0–52.0)
Hemoglobin: 12.1 g/dL — ABNORMAL LOW (ref 13.0–17.0)
MCH: 30.9 pg (ref 26.0–34.0)
MCHC: 35.6 g/dL (ref 30.0–36.0)
MCV: 87 fL (ref 80.0–100.0)
Platelets: 133 10*3/uL — ABNORMAL LOW (ref 150–400)
RBC: 3.91 MIL/uL — ABNORMAL LOW (ref 4.22–5.81)
RDW: 15.5 % (ref 11.5–15.5)
WBC: 8.5 10*3/uL (ref 4.0–10.5)
nRBC: 0 % (ref 0.0–0.2)

## 2022-11-09 LAB — RENAL FUNCTION PANEL
Albumin: 3.8 g/dL (ref 3.5–5.0)
Albumin: 3.1 g/dL — ABNORMAL LOW (ref 3.5–5.0)
Albumin: 3.4 g/dL — ABNORMAL LOW (ref 3.5–5.0)
Anion gap: 15 (ref 5–15)
Anion gap: 11 (ref 5–15)
Anion gap: 12 (ref 5–15)
BUN: 25 mg/dL — ABNORMAL HIGH (ref 6–20)
BUN: 23 mg/dL — ABNORMAL HIGH (ref 6–20)
BUN: 20 mg/dL (ref 6–20)
CO2: 26 mmol/L (ref 22–32)
CO2: 26 mmol/L (ref 22–32)
CO2: 26 mmol/L (ref 22–32)
Calcium: 7.8 mg/dL — ABNORMAL LOW (ref 8.9–10.3)
Calcium: 7.5 mg/dL — ABNORMAL LOW (ref 8.9–10.3)
Calcium: 7.5 mg/dL — ABNORMAL LOW (ref 8.9–10.3)
Chloride: 85 mmol/L — ABNORMAL LOW (ref 98–111)
Chloride: 90 mmol/L — ABNORMAL LOW (ref 98–111)
Chloride: 88 mmol/L — ABNORMAL LOW (ref 98–111)
Creatinine, Ser: 1.95 mg/dL — ABNORMAL HIGH (ref 0.61–1.24)
Creatinine, Ser: 1.66 mg/dL — ABNORMAL HIGH (ref 0.61–1.24)
Creatinine, Ser: 1.29 mg/dL — ABNORMAL HIGH (ref 0.61–1.24)
GFR, Estimated: 42 mL/min — ABNORMAL LOW (ref >60)
GFR, Estimated: 51 mL/min — ABNORMAL LOW (ref >60)
GFR, Estimated: >60 mL/min (ref >60)
Glucose, Bld: 123 mg/dL — ABNORMAL HIGH (ref 70–99)
Glucose, Bld: 137 mg/dL — ABNORMAL HIGH (ref 70–99)
Glucose, Bld: 156 mg/dL — ABNORMAL HIGH (ref 70–99)
Phosphorus: 2.9 mg/dL (ref 2.5–4.6)
Phosphorus: 2.9 mg/dL (ref 2.5–4.6)
Phosphorus: 2.7 mg/dL (ref 2.5–4.6)
Potassium: 2.8 mmol/L — ABNORMAL LOW (ref 3.5–5.1)
Potassium: 3.1 mmol/L — ABNORMAL LOW (ref 3.5–5.1)
Potassium: 2.9 mmol/L — ABNORMAL LOW (ref 3.5–5.1)
Sodium: 126 mmol/L — ABNORMAL LOW (ref 135–145)
Sodium: 127 mmol/L — ABNORMAL LOW (ref 135–145)
Sodium: 126 mmol/L — ABNORMAL LOW (ref 135–145)

## 2022-11-09 LAB — HEPATITIS PANEL, ACUTE
HCV Ab: NONREACTIVE
Hep A IgM: NONREACTIVE
Hep B C IgM: NONREACTIVE
Hepatitis B Surface Ag: NONREACTIVE

## 2022-11-09 LAB — HIV ANTIBODY (ROUTINE TESTING W REFLEX): HIV Screen 4th Generation wRfx: NONREACTIVE

## 2022-11-09 LAB — LACTIC ACID, PLASMA: Lactic Acid, Venous: 1.8 mmol/L (ref 0.5–1.9)

## 2022-11-09 LAB — CK: Total CK: 704 U/L — ABNORMAL HIGH (ref 49–397)

## 2022-11-09 LAB — MAGNESIUM: Magnesium: 1.4 mg/dL — ABNORMAL LOW (ref 1.7–2.4)

## 2022-11-09 LAB — OSMOLALITY, URINE: Osmolality, Ur: 397 mOsm/kg (ref 300–900)

## 2022-11-09 MED ORDER — LIDOCAINE VISCOUS HCL 2 % MT SOLN
15.0000 mL | Freq: Four times a day (QID) | OROMUCOSAL | Status: DC | PRN
  Administered 2022-11-09 – 2022-11-15 (×9): 15 mL via ORAL
  Filled 2022-11-09 (×14): qty 15

## 2022-11-09 MED ORDER — LIDOCAINE VISCOUS HCL 2 % MT SOLN
15.0000 mL | Freq: Once | OROMUCOSAL | Status: AC
  Administered 2022-11-09: 15 mL via ORAL
  Filled 2022-11-09: qty 15

## 2022-11-09 MED ORDER — ORAL CARE MOUTH RINSE: 15.0000 mL | OROMUCOSAL | Status: DC | PRN

## 2022-11-09 MED ORDER — POTASSIUM CHLORIDE CRYS ER 10 MEQ PO TBCR
40.0000 meq | EXTENDED_RELEASE_TABLET | ORAL | Status: AC
  Administered 2022-11-09 (×2): 40 meq via ORAL
  Filled 2022-11-09 (×2): qty 4

## 2022-11-09 MED ORDER — MAGNESIUM SULFATE 4 GM/100ML IV SOLN
4.0000 g | Freq: Once | INTRAVENOUS | Status: AC
  Administered 2022-11-09: 4 g via INTRAVENOUS
  Filled 2022-11-09: qty 100

## 2022-11-09 MED ORDER — PANTOPRAZOLE SODIUM 40 MG PO TBEC
40.0000 mg | DELAYED_RELEASE_TABLET | Freq: Every day | ORAL | Status: DC
  Administered 2022-11-09 – 2022-11-10 (×2): 40 mg via ORAL
  Filled 2022-11-09 (×2): qty 1

## 2022-11-09 MED ORDER — POTASSIUM CHLORIDE CRYS ER 20 MEQ PO TBCR
40.0000 meq | EXTENDED_RELEASE_TABLET | Freq: Once | ORAL | Status: AC
  Administered 2022-11-09: 40 meq via ORAL
  Filled 2022-11-09: qty 2

## 2022-11-09 MED ORDER — ENOXAPARIN SODIUM 40 MG/0.4ML IJ SOSY
40.0000 mg | PREFILLED_SYRINGE | Freq: Every day | INTRAMUSCULAR | Status: DC
  Administered 2022-11-09 – 2022-11-16 (×8): 40 mg via SUBCUTANEOUS
  Filled 2022-11-09 (×8): qty 0.4

## 2022-11-09 MED ORDER — ALUM & MAG HYDROXIDE-SIMETH 200-200-20 MG/5ML PO SUSP
30.0000 mL | Freq: Once | ORAL | Status: AC
  Administered 2022-11-09: 30 mL via ORAL
  Filled 2022-11-09: qty 30

## 2022-11-09 MED ORDER — ALUM & MAG HYDROXIDE-SIMETH 200-200-20 MG/5ML PO SUSP
30.0000 mL | Freq: Four times a day (QID) | ORAL | Status: DC | PRN
  Administered 2022-11-09 – 2022-11-16 (×12): 30 mL via ORAL
  Filled 2022-11-09 (×13): qty 30

## 2022-11-09 NOTE — Progress Notes (Signed)
PROGRESS NOTE    Richard Lopez  HYQ:[id number] DOB: 12/05/1975 DOA: 11/08/2022 PCP: Kerin Perna, NP    Chief Complaint  Patient presents with   Emesis   Weakness    Brief Narrative:  Patient 46 year old gentleman history of marijuana use, tobacco abuse, hypertension, GERD presented with nausea vomiting x 4 days with decreased oral intake.  Felt dehydrated with complaints of cramping/myalgias all over.  Had a few streaks of blood with vomiting episodes none for 24 hours prior to presentation.  Seen in the ED had multiple electrolyte abnormalities, dehydrated and admitted.   Assessment & Plan:   Principal Problem:   Intractable nausea and vomiting Active Problems:   Essential hypertension   Marijuana abuse   High anion gap metabolic acidosis   Hypokalemia   Hypomagnesemia   Hyponatremia   AKI (acute kidney injury) ([place])   Dehydration   Elevated CPK   Elevated LFTs   Hypocalcemia   Acute renal failure (HCC)  #1 nausea/vomiting -??  Etiology -Patient admitted, improving clinically. -Tolerating clear liquids, no further nausea or vomiting. -Patient improving clinically. -Continue IV fluid resuscitation. -Advance diet to full liquid diet and subsequently to a soft diet in the morning. -IV antiemetics, pain medications.  2.  Hypokalemia/hypomagnesemia/hyponatremia -Likely secondary to GI losses from nausea and vomiting in the setting of dehydration. -Potassium noted at 3.2 on admission, magnesium at 1.4, sodium of 124. -Potassium at 3.1 this morning, magnesium at 1.4. -Magnesium sulfate 4 g IV x 1, K-Dur 40 mEq p.o. every 4 hours x 2 doses. -Phosphorus noted at 2.7. -Repeat labs in AM. -Follow.  3.  Hypocalcemia -Likely secondary to hypomagnesemia. -Corrected calcium at 8.22. -Replete magnesium, -Check a vitamin D 25-hydroxy level. -Repeat labs in the AM.  4.  GERD -Place on PPI. -GI cocktail as needed.  5.  Dehydration -Increase IV fluids to normal  saline at 125 cc an hour. -Repeat labs in the AM.  6.  Hypertension -Continue home regimen Norvasc.  7.  Acute renal failure -Likely secondary to prerenal azotemia in the setting of nausea vomiting. -Creatinine on admission was 3.17. -Urine sodium < 10. -Urinalysis turbid, 5 ketones, trace leukocytes, protein > 300, 6-10 WBCs, no bacteria seen. -Abdominal ultrasound negative for hydronephrosis. -Renal function improving with hydration. -Creatinine currently at 1.66 this morning. -Continue IV fluids, monitor urine output.  8.  Transaminitis -Likely secondary to fatty liver in the setting of dehydration. -Acute hepatitis panel negative. -HIV nonreactive. -Abdominal ultrasound with hepatic fatty infiltration, cholelithiasis. -Repeat labs in the AM.  9.  Elevated CK -IV fluids -Repeat labs in the AM.  10.  Marijuana abuse -Marijuana cessation stressed.    DVT prophylaxis: Lovenox Code Status: Full Family Communication: Updated patient.  No family at bedside. Disposition: Likely home when clinically improved, electrolyte abnormalities corrected.  Status is: Inpatient Remains inpatient appropriate because: Severity of illness   Consultants:  None  Procedures:  Chest x-ray 11/08/2022 Abdominal ultrasound 11/08/2022   Antimicrobials:  None   Subjective: Sitting up at the side of the bed.  States nausea and vomiting improved.  Tolerating clears.  No chest pain.  No shortness of breath.  Complain of an intense burning sensation in his esophagus and throat.  States has a history of GERD.  Objective: Vitals:   11/09/22 0809 11/09/22 1100 11/09/22 1250 11/09/22 1327  BP: (!) 142/95  (!) 155/73   Pulse:   (!) 52   Resp:   18   Temp:   97.8 F (  36.6 C)   TempSrc:   Oral   SpO2:   96%   Weight:    100 kg  Height:  6\' 6"  (1.981 m)      Intake/Output Summary (Last 24 hours) at 11/09/2022 1743 Last data filed at 11/09/2022 1515 Gross per 24 hour  Intake 3341.67 ml   Output 600 ml  Net 2741.67 ml   Filed Weights   11/09/22 1327  Weight: 100 kg    Examination:  General exam: Appears calm and comfortable  Respiratory system: Clear to auscultation. Respiratory effort normal. Cardiovascular system: S1 & S2 heard, RRR. No JVD, murmurs, rubs, gallops or clicks. No pedal edema. Gastrointestinal system: Abdomen is nondistended, soft and nontender. No organomegaly or masses felt. Normal bowel sounds heard. Central nervous system: Alert and oriented. No focal neurological deficits. Extremities: Symmetric 5 x 5 power. Skin: No rashes, lesions or ulcers Psychiatry: Judgement and insight appear normal. Mood & affect appropriate.     Data Reviewed: I have personally reviewed following labs and imaging studies  CBC: Recent Labs  Lab 11/08/22 1049 11/09/22 0121  WBC 13.7* 8.5  HGB 14.4 12.1*  HCT 40.8 34.0*  MCV 83.6 87.0  PLT 177 133*    Basic Metabolic Panel: Recent Labs  Lab 11/08/22 1049 11/09/22 0121 11/09/22 0457 11/09/22 1129  NA 124* 126* 127* 126*  K 3.2* 2.8* 3.1* 2.9*  CL 76* 85* 90* 88*  CO2 27 26 26 26   GLUCOSE 233* 123* 137* 156*  BUN 24* 25* 23* 20  CREATININE 3.17* 1.95* 1.66* 1.29*  CALCIUM 9.3 7.8* 7.5* 7.5*  MG 1.4*  --  1.4*  --   PHOS  --  2.9 2.9 2.7    GFR: Estimated Creatinine Clearance: 93.5 mL/min (A) (by C-G formula based on SCr of 1.29 mg/dL (H)).  Liver Function Tests: Recent Labs  Lab 11/08/22 1049 11/09/22 0121 11/09/22 0457 11/09/22 1129  AST 50*  --   --   --   ALT 21  --   --   --   ALKPHOS 78  --   --   --   BILITOT 1.4*  --   --   --   PROT 9.6*  --   --   --   ALBUMIN 5.2* 3.8 3.1* 3.4*    CBG: Recent Labs  Lab 11/08/22 1029  GLUCAP 250*     No results found for this or any previous visit (from the past 240 hour(s)).       Radiology Studies: US Abdomen Complete  Result Date: 11/08/2022 CLINICAL DATA:  221910 Elevated LFTs 221910 [id number] AKI (acute kidney injury) ([place])  [id number] EXAM: ABDOMEN ULTRASOUND COMPLETE COMPARISON:  09/19/2022 FINDINGS: Gallbladder: Numerous gallstones. No wall thickening or pericholecystic fluid Common bile duct: Diameter: 0.4cm. Liver: No focal lesion identified. Hyperechoic consistent with fatty infiltration. No intrahepatic ductal dilatation. Hepatopetal portal vein. IVC: No abnormality visualized. Pancreas: Visualized portion unremarkable. Spleen: Size and appearance within normal limits. Right Kidney: Length: 11.6cm. Echogenicity within normal limits. No mass or hydronephrosis visualized. Left Kidney: Length: 11.2cm. Echogenicity within normal limits. No mass or hydronephrosis visualized. Abdominal aorta: No aneurysm visualized. IMPRESSION: Hepatic fatty infiltration. Cholelithiasis. Electronically Signed   By: Sammie Bench M.D.   On: 11/08/2022 19:21   DG Chest Portable 1 View  Result Date: 11/08/2022 CLINICAL DATA:  Chest pain EXAM: PORTABLE CHEST 1 VIEW COMPARISON:  Chest x-ray May 08, 2021 FINDINGS: The heart size and mediastinal contours are within normal limits. Both lungs  are clear. The visualized skeletal structures are unremarkable. IMPRESSION: No active disease. Electronically Signed   By: Gerome Sam III M.D.   On: 11/08/2022 10:52        Scheduled Meds:  amLODipine  10 mg Oral Daily   enoxaparin (LOVENOX) injection  40 mg Subcutaneous QHS   pantoprazole  40 mg Oral Q0600   potassium chloride  40 mEq Oral Q4H   Continuous Infusions:  sodium chloride 125 mL/hr at 11/09/22 1515     LOS: 1 day    Time spent: 35 minutes    Ramiro Harvest, MD [facility]   To contact the attending provider between 7A-7P or the covering provider during after hours 7P-7A, please log into the web site [email or web address] and access using universal [place] password for that web site. If you do not have the password, please call the hospital operator.  11/09/2022, 5:43 PM

## 2022-11-10 LAB — COMPREHENSIVE METABOLIC PANEL
ALT: 26 U/L (ref 0–44)
AST: 60 U/L — ABNORMAL HIGH (ref 15–41)
Albumin: 3.4 g/dL — ABNORMAL LOW (ref 3.5–5.0)
Alkaline Phosphatase: 50 U/L (ref 38–126)
Anion gap: 9 (ref 5–15)
BUN: 15 mg/dL (ref 6–20)
CO2: 26 mmol/L (ref 22–32)
Calcium: 8.1 mg/dL — ABNORMAL LOW (ref 8.9–10.3)
Chloride: 93 mmol/L — ABNORMAL LOW (ref 98–111)
Creatinine, Ser: 0.84 mg/dL (ref 0.61–1.24)
GFR, Estimated: >60 mL/min (ref >60)
Glucose, Bld: 127 mg/dL — ABNORMAL HIGH (ref 70–99)
Potassium: 3.3 mmol/L — ABNORMAL LOW (ref 3.5–5.1)
Sodium: 128 mmol/L — ABNORMAL LOW (ref 135–145)
Total Bilirubin: 1 mg/dL (ref 0.3–1.2)
Total Protein: 6.7 g/dL (ref 6.5–8.1)

## 2022-11-10 LAB — MAGNESIUM: Magnesium: 2.2 mg/dL (ref 1.7–2.4)

## 2022-11-10 LAB — PHOSPHORUS: Phosphorus: 1.8 mg/dL — ABNORMAL LOW (ref 2.5–4.6)

## 2022-11-10 LAB — CK: Total CK: 744 U/L — ABNORMAL HIGH (ref 49–397)

## 2022-11-10 LAB — CBC
HCT: 32.7 % — ABNORMAL LOW (ref 39.0–52.0)
Hemoglobin: 11 g/dL — ABNORMAL LOW (ref 13.0–17.0)
MCH: 29.9 pg (ref 26.0–34.0)
MCHC: 33.6 g/dL (ref 30.0–36.0)
MCV: 88.9 fL (ref 80.0–100.0)
Platelets: 122 10*3/uL — ABNORMAL LOW (ref 150–400)
RBC: 3.68 MIL/uL — ABNORMAL LOW (ref 4.22–5.81)
RDW: 15.6 % — ABNORMAL HIGH (ref 11.5–15.5)
WBC: 5 10*3/uL (ref 4.0–10.5)
nRBC: 0 % (ref 0.0–0.2)

## 2022-11-10 LAB — HEMOGLOBIN A1C
Hgb A1c MFr Bld: 6 % — ABNORMAL HIGH (ref 4.8–5.6)
Mean Plasma Glucose: 126 mg/dL

## 2022-11-10 LAB — LIPASE, BLOOD: Lipase: 42 U/L (ref 11–51)

## 2022-11-10 MED ORDER — INFLUENZA VAC SPLIT QUAD 0.5 ML IM SUSY: 0.5000 mL | PREFILLED_SYRINGE | INTRAMUSCULAR | Status: DC

## 2022-11-10 MED ORDER — SUCRALFATE 1 GM/10ML PO SUSP
1.0000 g | Freq: Four times a day (QID) | ORAL | Status: DC
  Administered 2022-11-10 – 2022-11-17 (×25): 1 g via ORAL
  Filled 2022-11-10 (×27): qty 10

## 2022-11-10 MED ORDER — SODIUM CHLORIDE 0.9 % IV BOLUS
1000.0000 mL | Freq: Once | INTRAVENOUS | Status: AC
  Administered 2022-11-10: 1000 mL via INTRAVENOUS

## 2022-11-10 MED ORDER — POTASSIUM PHOSPHATES 15 MMOLE/5ML IV SOLN
30.0000 mmol | Freq: Once | INTRAVENOUS | Status: AC
  Administered 2022-11-10: 30 mmol via INTRAVENOUS
  Filled 2022-11-10: qty 10

## 2022-11-10 MED ORDER — SUCRALFATE 1 GM/10ML PO SUSP: 1.0000 g | Freq: Four times a day (QID) | ORAL | Status: DC

## 2022-11-10 MED ORDER — POTASSIUM CHLORIDE CRYS ER 10 MEQ PO TBCR
40.0000 meq | EXTENDED_RELEASE_TABLET | ORAL | Status: AC
  Administered 2022-11-10 (×2): 40 meq via ORAL
  Filled 2022-11-10 (×2): qty 4

## 2022-11-10 MED ORDER — PANTOPRAZOLE SODIUM 40 MG IV SOLR
40.0000 mg | Freq: Two times a day (BID) | INTRAVENOUS | Status: DC
  Administered 2022-11-10 – 2022-11-17 (×14): 40 mg via INTRAVENOUS
  Filled 2022-11-10 (×14): qty 10

## 2022-11-10 MED ORDER — ACETAMINOPHEN 325 MG PO TABS
650.0000 mg | ORAL_TABLET | Freq: Once | ORAL | Status: AC
  Administered 2022-11-10: 650 mg via ORAL
  Filled 2022-11-10: qty 2

## 2022-11-10 MED ORDER — PANTOPRAZOLE 80MG IVPB - SIMPLE MED
80.0000 mg | Freq: Once | INTRAVENOUS | Status: AC
  Administered 2022-11-10: 80 mg via INTRAVENOUS
  Filled 2022-11-10: qty 80

## 2022-11-10 MED ORDER — PANTOPRAZOLE SODIUM 40 MG PO TBEC: 40.0000 mg | DELAYED_RELEASE_TABLET | Freq: Two times a day (BID) | ORAL | Status: DC

## 2022-11-10 NOTE — Progress Notes (Signed)
Mobility Specialist - Progress Note   11/10/22 1104  Mobility  Activity Ambulated independently in hallway  Level of Assistance Independent  Assistive Device None  Distance Ambulated (ft) 1050 ft  Range of Motion/Exercises Active  Activity Response Tolerated well  Mobility Referral Yes  $Mobility charge 1 Mobility   Pt was found in bed and agreeable to ambulate. Stated having some knee pain and that it might be from gout. At EOS returned to room with RN.  Ferd Hibbs Mobility Specialist

## 2022-11-10 NOTE — Progress Notes (Signed)
Mobility Specialist - Progress Note   11/10/22 1502  Mobility  Activity Ambulated independently in hallway  Level of Assistance Independent  Assistive Device None  Distance Ambulated (ft) 350 ft  Range of Motion/Exercises Active  Activity Response Tolerated well  Mobility Referral Yes  $Mobility charge 1 Mobility   Pt was found in room and agreeable to ambulate. Had no complaints and was left in room with IV RN.  Ferd Hibbs Mobility Specialist

## 2022-11-10 NOTE — Progress Notes (Signed)
PROGRESS NOTE    Richard Lopez  WNI:[id number] DOB: Jun 28, 1976 DOA: 11/08/2022 PCP: [name], NP    Chief Complaint  Patient presents with   Emesis   Weakness    Brief Narrative:  Patient 47 year old gentleman history of marijuana use, tobacco abuse, hypertension, GERD presented with nausea vomiting x 4 days with decreased oral intake.  Felt dehydrated with complaints of cramping/myalgias all over.  Had a few streaks of blood with vomiting episodes none for 24 hours prior to presentation.  Seen in the ED had multiple electrolyte abnormalities, dehydrated and admitted.   Assessment & Plan:   Principal Problem:   Intractable nausea and vomiting Active Problems:   Essential hypertension   Marijuana abuse   High anion gap metabolic acidosis   Hypokalemia   Hypomagnesemia   Hyponatremia   AKI (acute kidney injury) (HCC)   Dehydration   Elevated CPK   Elevated LFTs   Hypocalcemia   Acute renal failure (HCC)  #1 nausea/vomiting -??  Etiology -Patient admitted, improving clinically. -Tolerating clear liquids and subsequently full liquids, no further nausea or vomiting. -Patient improving clinically. -Continue IV fluid resuscitation. -Advance diet to soft diet.   -IV antiemetics, supportive care, IV fluids.   2.  Hypokalemia/hypomagnesemia/hyponatremia/hypophosphatemia -Likely secondary to GI losses from nausea and vomiting in the setting of dehydration. -Potassium noted at 3.2 on admission, magnesium at 1.4, sodium of 124. -Potassium at 3.3 this morning, magnesium at 2.2, phosphorus level at 1.8. -K-Phos 30 mmol IV x 1.  K-Dur 40 mEq p.o. x 1, -Repeat labs in AM. -Follow.  3.  Hypocalcemia -Likely secondary to hypomagnesemia. -Corrected calcium at 8.22. -Magnesium at 2.2.  -Check a vitamin D 25-hydroxy level. -Repeat labs in the AM.  4.  GERD -Patient with complaints of significant throat burning/indigestion, states GI cocktail helped.   -Patient also  with some epigastric pain per RN this afternoon.   -Check a lipase.   -Change PPI to IV PPI twice daily.  Give Protonix 80 mg IV push x 1.   -Start Carafate.   -GI cocktail as needed.  5.  Dehydration -Continue IV fluid, normal saline at 125 cc an hour. -Normal saline bolus x 1. -Repeat labs in the AM.  6.  Hypertension -Norvasc.    7.  Acute renal failure -Likely secondary to prerenal azotemia in the setting of nausea vomiting. -Creatinine on admission was 3.17. -Urine sodium < 10. -Urinalysis turbid, 5 ketones, trace leukocytes, protein > 300, 6-10 WBCs, no bacteria seen. -Abdominal ultrasound negative for hydronephrosis. -Renal function improving with hydration. -Creatinine currently at 0.84.   -IV fluids, supportive care.    8.  Transaminitis -Likely secondary to fatty liver in the setting of dehydration. -Acute hepatitis panel negative. -HIV nonreactive. -Abdominal ultrasound with hepatic fatty infiltration, cholelithiasis. -Repeat labs in the AM.  9.  Elevated CK -IV fluids -Repeat labs in the AM.  10.  Marijuana abuse -Marijuana cessation stressed.    DVT prophylaxis: Lovenox Code Status: Full Family Communication: Updated patient.  No family at bedside. Disposition: Likely home when clinically improved, electrolyte abnormalities corrected.  Status is: Inpatient Remains inpatient appropriate because: Severity of illness   Consultants:  None  Procedures:  Chest x-ray 11/08/2022 Abdominal ultrasound 11/08/2022   Antimicrobials:  None   Subjective: Sitting up on the side of the bed.  Overall feeling better.  Complaining of significant indigestion/chest burning.  States that he sleeps too well yesterday.  States GI cocktail helped.  History of GERD.  Objective: Vitals:   11/10/22 0446 11/10/22 0449 11/10/22 1304 11/10/22 1324  BP: (!) 142/80   (!) 156/89  Pulse: 81  74   Resp: 18  18   Temp: 98.5 F (36.9 C)  98.2 F (36.8 C)   TempSrc: Oral   Oral   SpO2: 100%     Weight:  104 kg    Height:        Intake/Output Summary (Last 24 hours) at 11/10/2022 1325 Last data filed at 11/10/2022 1048 Gross per 24 hour  Intake 1942.46 ml  Output 830 ml  Net 1112.46 ml    Filed Weights   11/09/22 1327 11/10/22 0449  Weight: 100 kg 104 kg    Examination:  General exam: NAD. Respiratory system: No wheezes, no crackles, no rhonchi.  Fair air movement.  Speaking in full sentences.   Cardiovascular system: Regular rate rhythm no murmurs rubs or gallops.  No JVD.  No lower extremity edema.  Gastrointestinal system: Abdomen is soft, nontender, nondistended, positive bowel sounds.  No rebound.  No guarding.  Central nervous system: Alert and oriented. No focal neurological deficits. Extremities: Symmetric 5 x 5 power. Skin: No rashes, lesions or ulcers Psychiatry: Judgement and insight appear normal. Mood & affect appropriate.     Data Reviewed: I have personally reviewed following labs and imaging studies  CBC: Recent Labs  Lab 11/08/22 1049 11/09/22 0121 11/10/22 0439  WBC 13.7* 8.5 5.0  HGB 14.4 12.1* 11.0*  HCT 40.8 34.0* 32.7*  MCV 83.6 87.0 88.9  PLT 177 133* 122*     Basic Metabolic Panel: Recent Labs  Lab 11/08/22 1049 11/09/22 0121 11/09/22 0457 11/09/22 1129 11/10/22 0439  NA 124* 126* 127* 126* 128*  K 3.2* 2.8* 3.1* 2.9* 3.3*  CL 76* 85* 90* 88* 93*  CO2 27 26 26 26 26   GLUCOSE 233* 123* 137* 156* 127*  BUN 24* 25* 23* 20 15  CREATININE 3.17* 1.95* 1.66* 1.29* 0.84  CALCIUM 9.3 7.8* 7.5* 7.5* 8.1*  MG 1.4*  --  1.4*  --  2.2  PHOS  --  2.9 2.9 2.7 1.8*     GFR: Estimated Creatinine Clearance: 143.6 mL/min (by C-G formula based on SCr of 0.84 mg/dL).  Liver Function Tests: Recent Labs  Lab 11/08/22 1049 11/09/22 0121 11/09/22 0457 11/09/22 1129 11/10/22 0439  AST 50*  --   --   --  60*  ALT 21  --   --   --  26  ALKPHOS 78  --   --   --  50  BILITOT 1.4*  --   --   --  1.0  PROT 9.6*  --    --   --  6.7  ALBUMIN 5.2* 3.8 3.1* 3.4* 3.4*     CBG: Recent Labs  Lab 11/08/22 1029  GLUCAP 250*      No results found for this or any previous visit (from the past 240 hour(s)).       Radiology Studies: 01/07/23 Abdomen Complete  Result Date: 11/08/2022 CLINICAL DATA:  221910 Elevated LFTs 221910 07-25-1993 AKI (acute kidney injury) (HCC) [id number] EXAM: ABDOMEN ULTRASOUND COMPLETE COMPARISON:  09/19/2022 FINDINGS: Gallbladder: Numerous gallstones. No wall thickening or pericholecystic fluid Common bile duct: Diameter: 0.4cm. Liver: No focal lesion identified. Hyperechoic consistent with fatty infiltration. No intrahepatic ductal dilatation. Hepatopetal portal vein. IVC: No abnormality visualized. Pancreas: Visualized portion unremarkable. Spleen: Size and appearance within normal limits. Right Kidney: Length: 11.6cm. Echogenicity within normal limits. No mass  or hydronephrosis visualized. Left Kidney: Length: 11.2cm. Echogenicity within normal limits. No mass or hydronephrosis visualized. Abdominal aorta: No aneurysm visualized. IMPRESSION: Hepatic fatty infiltration. Cholelithiasis. Electronically Signed   By: Sammie Bench M.D.   On: 11/08/2022 19:21        Scheduled Meds:  amLODipine  10 mg Oral Daily   enoxaparin (LOVENOX) injection  40 mg Subcutaneous QHS   [START ON 11/11/2022] influenza vac split quadrivalent PF  0.5 mL Intramuscular Tomorrow-1000   pantoprazole  40 mg Oral Q0600   potassium chloride  40 mEq Oral Q4H   Continuous Infusions:  sodium chloride 125 mL/hr at 11/09/22 1841   potassium PHOSPHATE IVPB (in mmol)       LOS: 2 days    Time spent: 35 minutes    Irine Seal, MD [facility]   To contact the attending provider between 7A-7P or the covering provider during after hours 7P-7A, please log into the web site [email or web address] and access using universal [place] password for that web site. If you do not have the password, please call the  hospital operator.  11/10/2022, 1:25 PM

## 2022-11-11 LAB — CBC WITH DIFFERENTIAL/PLATELET
Abs Immature Granulocytes: 0.01 10*3/uL (ref 0.00–0.07)
Basophils Absolute: 0 10*3/uL (ref 0.0–0.1)
Basophils Relative: 1 %
Eosinophils Absolute: 0 10*3/uL (ref 0.0–0.5)
Eosinophils Relative: 1 %
HCT: 33 % — ABNORMAL LOW (ref 39.0–52.0)
Hemoglobin: 11.2 g/dL — ABNORMAL LOW (ref 13.0–17.0)
Immature Granulocytes: 0 %
Lymphocytes Relative: 50 %
Lymphs Abs: 2.4 10*3/uL (ref 0.7–4.0)
MCH: 30.1 pg (ref 26.0–34.0)
MCHC: 33.9 g/dL (ref 30.0–36.0)
MCV: 88.7 fL (ref 80.0–100.0)
Monocytes Absolute: 0.5 10*3/uL (ref 0.1–1.0)
Monocytes Relative: 12 %
Neutro Abs: 1.7 10*3/uL (ref 1.7–7.7)
Neutrophils Relative %: 36 %
Platelets: 142 10*3/uL — ABNORMAL LOW (ref 150–400)
RBC: 3.72 MIL/uL — ABNORMAL LOW (ref 4.22–5.81)
RDW: 15.5 % (ref 11.5–15.5)
WBC: 4.7 10*3/uL (ref 4.0–10.5)
nRBC: 0 % (ref 0.0–0.2)

## 2022-11-11 LAB — RENAL FUNCTION PANEL
Albumin: 3 g/dL — ABNORMAL LOW (ref 3.5–5.0)
Anion gap: 8 (ref 5–15)
BUN: 9 mg/dL (ref 6–20)
CO2: 27 mmol/L (ref 22–32)
Calcium: 8.5 mg/dL — ABNORMAL LOW (ref 8.9–10.3)
Chloride: 98 mmol/L (ref 98–111)
Creatinine, Ser: 0.9 mg/dL (ref 0.61–1.24)
GFR, Estimated: >60 mL/min (ref >60)
Glucose, Bld: 112 mg/dL — ABNORMAL HIGH (ref 70–99)
Phosphorus: 3.6 mg/dL (ref 2.5–4.6)
Potassium: 3.4 mmol/L — ABNORMAL LOW (ref 3.5–5.1)
Sodium: 133 mmol/L — ABNORMAL LOW (ref 135–145)

## 2022-11-11 LAB — MAGNESIUM: Magnesium: 1.7 mg/dL (ref 1.7–2.4)

## 2022-11-11 MED ORDER — POTASSIUM CHLORIDE CRYS ER 10 MEQ PO TBCR
40.0000 meq | EXTENDED_RELEASE_TABLET | Freq: Once | ORAL | Status: AC
  Administered 2022-11-11: 40 meq via ORAL
  Filled 2022-11-11: qty 4

## 2022-11-11 MED ORDER — SODIUM CHLORIDE 0.9 % IV SOLN: INTRAVENOUS | Status: DC

## 2022-11-11 MED ORDER — MAGNESIUM SULFATE 2 GM/50ML IV SOLN
2.0000 g | Freq: Once | INTRAVENOUS | Status: AC
  Administered 2022-11-11: 2 g via INTRAVENOUS
  Filled 2022-11-11: qty 50

## 2022-11-11 NOTE — Progress Notes (Signed)
PROGRESS NOTE    Richard Lopez  WNU:[id number] DOB: 05-24-1976 DOA: 11/08/2022 PCP: Kerin Perna, NP    Chief Complaint  Patient presents with   Emesis   Weakness    Brief Narrative:  Patient 47 year old gentleman history of marijuana use, tobacco abuse, hypertension, GERD presented with nausea vomiting x 4 days with decreased oral intake.  Felt dehydrated with complaints of cramping/myalgias all over.  Had a few streaks of blood with vomiting episodes none for 24 hours prior to presentation.  Seen in the ED had multiple electrolyte abnormalities, dehydrated and admitted.   Assessment & Plan:   Principal Problem:   Intractable nausea and vomiting Active Problems:   Essential hypertension   Marijuana abuse   High anion gap metabolic acidosis   Hypokalemia   Hypomagnesemia   Hyponatremia   AKI (acute kidney injury) ([place])   Dehydration   Elevated CPK   Elevated LFTs   Hypocalcemia   Acute renal failure (HCC)  #1 nausea/vomiting -??  Etiology -Patient admitted, improving clinically. -Tolerating clear liquids and subsequently full liquids, no further nausea or vomiting. -Patient improving clinically. -Patient advanced to soft diet however complain of significant indigestion/heartburn and epigastric pain and as such has been limiting himself to liquid diet. -Consult GI for further evaluation and management. -IV antiemetics, supportive care  2.  Hypokalemia/hypomagnesemia/hyponatremia/hypophosphatemia -Likely secondary to GI losses from nausea and vomiting in the setting of dehydration. -Potassium noted at 3.2 on admission, magnesium at 1.4, sodium of 124. -Potassium at 3.4 this morning, magnesium at 1.7, phosphorus level at 3.6. - K-Dur 40 mEq p.o. x 1, -Magnesium 2 g IV x 1. -Repeat labs in AM. -Follow.  3.  Hypocalcemia -Likely secondary to hypomagnesemia. -Corrected calcium at 9.3. -Magnesium at 2.2.  -Repeat labs in the AM.  4.  GERD -Patient with  complaints of significant throat burning/indigestion, states GI cocktail helped.   -Patient also with some epigastric pain with solid foods.  Patient and as such has just been on a liquid diet himself. -Lipase level at 42. -Continue IV PPI twice daily. -Continue Carafate. -Consult with GI for further evaluation and management, may need upper endoscopy for further evaluation versus empiric Diflucan.  No oral thrush noted. -GI cocktail as needed.  5.  Dehydration -Hydrated with IV fluids.   -Saline lock IV fluids.   6.  Hypertension -Continue Norvasc.   7.  Acute renal failure -Likely secondary to prerenal azotemia in the setting of nausea vomiting. -Creatinine on admission was 3.17. -Urine sodium < 10. -Urinalysis turbid, 5 ketones, trace leukocytes, protein > 300, 6-10 WBCs, no bacteria seen. -Abdominal ultrasound negative for hydronephrosis. -Renal function improving with hydration. -Creatinine currently at 0.9.   -IV fluids, supportive care.    8.  Transaminitis -Likely secondary to fatty liver in the setting of dehydration. -Acute hepatitis panel negative. -HIV nonreactive. -Abdominal ultrasound with hepatic fatty infiltration, cholelithiasis. -Repeat labs in the AM.  9.  Elevated CK -Saline lock IV fluids.   -Repeat labs in the AM.  10.  Marijuana abuse -Marijuana cessation stressed to patient.     DVT prophylaxis: Lovenox Code Status: Full Family Communication: Updated patient.  No family at bedside. Disposition: Likely home when clinically improved, electrolyte abnormalities corrected.  Status is: Inpatient Remains inpatient appropriate because: Severity of illness   Consultants:  None  Procedures:  Chest x-ray 11/08/2022 Abdominal ultrasound 11/08/2022   Antimicrobials:  None   Subjective: Sitting up on the side of the bed.  Noted to  have had a liquid breakfast.  Still with complaints of significant midsternal burning pain.  States when he tries to a  soft diet has significant worsening heartburn and worsening pain in the epigastrium that he is very hesitant to eat any solid food and has been limiting himself to liquids.  History of GERD.   Objective: Vitals:   11/10/22 2022 11/11/22 0500 11/11/22 0540 11/11/22 1217  BP: (!) 142/96  136/87 (!) 142/89  Pulse: 80  69 75  Resp: 18  18 18   Temp: 98.1 F (36.7 C)  98.2 F (36.8 C) (!) 97.4 F (36.3 C)  TempSrc: Oral  Oral Oral  SpO2: 100%  100% 100%  Weight:  102.6 kg    Height:        Intake/Output Summary (Last 24 hours) at 11/11/2022 1413 Last data filed at 11/11/2022 1218 Gross per 24 hour  Intake 1548.04 ml  Output 1750 ml  Net -201.96 ml    Filed Weights   11/09/22 1327 11/10/22 0449 11/11/22 0500  Weight: 100 kg 104 kg 102.6 kg    Examination:  General exam: NAD. Respiratory system: Lungs clear to auscultation bilaterally.  No wheezes, no crackles, no rhonchi.  Fair air movement.  Speaking in full sentences. Cardiovascular system: RRR no murmurs rubs or gallops.  No JVD.  No lower extremity edema.  Gastrointestinal system: Abdomen is soft, nontender, nondistended, positive bowel sounds.  No rebound.  No guarding.  Central nervous system: Alert and oriented. No focal neurological deficits. Extremities: Symmetric 5 x 5 power. Skin: No rashes, lesions or ulcers Psychiatry: Judgement and insight appear normal. Mood & affect appropriate.     Data Reviewed: I have personally reviewed following labs and imaging studies  CBC: Recent Labs  Lab 11/08/22 1049 11/09/22 0121 11/10/22 0439 11/11/22 0440  WBC 13.7* 8.5 5.0 4.7  NEUTROABS  --   --   --  1.7  HGB 14.4 12.1* 11.0* 11.2*  HCT 40.8 34.0* 32.7* 33.0*  MCV 83.6 87.0 88.9 88.7  PLT 177 133* 122* 142*     Basic Metabolic Panel: Recent Labs  Lab 11/08/22 1049 11/09/22 0121 11/09/22 0457 11/09/22 1129 11/10/22 0439 11/11/22 0440  NA 124* 126* 127* 126* 128* 133*  K 3.2* 2.8* 3.1* 2.9* 3.3* 3.4*  CL 76*  85* 90* 88* 93* 98  CO2 27 26 26 26 26 27   GLUCOSE 233* 123* 137* 156* 127* 112*  BUN 24* 25* 23* 20 15 9   CREATININE 3.17* 1.95* 1.66* 1.29* 0.84 0.90  CALCIUM 9.3 7.8* 7.5* 7.5* 8.1* 8.5*  MG 1.4*  --  1.4*  --  2.2 1.7  PHOS  --  2.9 2.9 2.7 1.8* 3.6     GFR: Estimated Creatinine Clearance: 134 mL/min (by C-G formula based on SCr of 0.9 mg/dL).  Liver Function Tests: Recent Labs  Lab 11/08/22 1049 11/09/22 0121 11/09/22 0457 11/09/22 1129 11/10/22 0439 11/11/22 0440  AST 50*  --   --   --  60*  --   ALT 21  --   --   --  26  --   ALKPHOS 78  --   --   --  50  --   BILITOT 1.4*  --   --   --  1.0  --   PROT 9.6*  --   --   --  6.7  --   ALBUMIN 5.2* 3.8 3.1* 3.4* 3.4* 3.0*     CBG: Recent Labs  Lab 11/08/22 1029  GLUCAP 250*      No results found for this or any previous visit (from the past 240 hour(s)).       Radiology Studies: No results found.      Scheduled Meds:  amLODipine  10 mg Oral Daily   enoxaparin (LOVENOX) injection  40 mg Subcutaneous QHS   influenza vac split quadrivalent PF  0.5 mL Intramuscular Tomorrow-1000   pantoprazole (PROTONIX) IV  40 mg Intravenous Q12H   sucralfate  1 g Oral Q6H   Continuous Infusions:     LOS: 3 days    Time spent: 35 minutes    Ramiro Harvest, MD [facility]   To contact the attending provider between 7A-7P or the covering provider during after hours 7P-7A, please log into the web site [email or web address] and access using universal [place] password for that web site. If you do not have the password, please call the hospital operator.  11/11/2022, 2:13 PM

## 2022-11-11 NOTE — Progress Notes (Signed)
Mobility Specialist - Progress Note   11/11/22 1126  Mobility  Activity Ambulated independently in hallway  Level of Assistance Independent  Assistive Device None  Distance Ambulated (ft) 1050 ft  Range of Motion/Exercises Active  Activity Response Tolerated well  Mobility Referral Yes  $Mobility charge 1 Mobility   Pt was found in bed and agreeable to ambulate. C/o knee pain and hip pain during ambulation. At EOS returned to bed with all necessities in reach.  Ferd Hibbs Mobility Specialist

## 2022-11-11 NOTE — Progress Notes (Signed)
Mobility Specialist - Progress Note   11/11/22 1536  Mobility  Activity Ambulated independently in hallway  Level of Assistance Independent  Assistive Device None  Distance Ambulated (ft) 1050 ft  Range of Motion/Exercises Active  Activity Response Tolerated well  Mobility Referral Yes  $Mobility charge 1 Mobility   Pt was found on recliner chair and agreeable to ambulate. Had no complaints and at EOS returned to recliner chair with all necessities in reach.  Ferd Hibbs Mobility Specialist

## 2022-11-11 NOTE — Anesthesia Preprocedure Evaluation (Signed)
Anesthesia Evaluation  Patient identified by MRN, date of birth, ID band Patient awake    Reviewed: Allergy & Precautions, NPO status , Patient's Chart, lab work & pertinent test results  Airway Mallampati: II  TM Distance: >3 FB Neck ROM: Full    Dental no notable dental hx. (+) Teeth Intact, Dental Advisory Given, Poor Dentition,    Pulmonary former smoker, PE (2021)   Pulmonary exam normal breath sounds clear to auscultation       Cardiovascular hypertension, Normal cardiovascular exam Rhythm:Regular Rate:Normal     Neuro/Psych  PSYCHIATRIC DISORDERS Anxiety Depression       GI/Hepatic negative GI ROS, Neg liver ROS,,,Lab Results      Component                Value               Date                      ALT                      26                  11/10/2022                AST                      60 (H)              11/10/2022                ALKPHOS                  50                  11/10/2022                BILITOT                  1.0                 11/10/2022              Endo/Other  diabetes    Renal/GU Renal diseaseLab Results      Component                Value               Date                      CREATININE               0.90                11/11/2022                BUN                      9                   11/11/2022                NA                       133 (L)             11/11/2022  K                        3.4 (L)             11/11/2022                     Musculoskeletal  (+) Arthritis ,    Abdominal   Peds  Hematology  (+) Blood dyscrasia, anemia Lab Results      Component                Value               Date                                HGB                      11.2 (L)            11/11/2022                HCT                      33.0 (L)            11/11/2022                 PLT                      142 (L)             11/11/2022              Anesthesia Other  Findings   Reproductive/Obstetrics                             Anesthesia Physical Anesthesia Plan  ASA: 3  Anesthesia Plan: MAC   Post-op Pain Management:    Induction: Intravenous  PONV Risk Score and Plan: Treatment may vary due to age or medical condition and Propofol infusion  Airway Management Planned: Natural Airway and Nasal Cannula  Additional Equipment: None  Intra-op Plan:   Post-operative Plan:   Informed Consent: I have reviewed the patients History and Physical, chart, labs and discussed the procedure including the risks, benefits and alternatives for the proposed anesthesia with the patient or authorized representative who has indicated his/her understanding and acceptance.     Dental advisory given  Plan Discussed with: CRNA and Anesthesiologist  Anesthesia Plan Comments: (EGD for Dysphagia)        Anesthesia Quick Evaluation

## 2022-11-12 ENCOUNTER — Inpatient Hospital Stay (HOSPITAL_COMMUNITY): Payer: Medicaid Other | Admitting: Anesthesiology

## 2022-11-12 ENCOUNTER — Encounter (HOSPITAL_COMMUNITY)
Admission: EM | Disposition: A | Discharge status: Home / Self Care | Payer: Self-pay | Source: Home / Self Care | Attending: Internal Medicine

## 2022-11-12 ENCOUNTER — Encounter (HOSPITAL_COMMUNITY): Payer: Self-pay | Admitting: Internal Medicine

## 2022-11-12 DIAGNOSIS — K3189 Other diseases of stomach and duodenum: Secondary | ICD-10-CM

## 2022-11-12 DIAGNOSIS — K2289 Other specified disease of esophagus: Secondary | ICD-10-CM

## 2022-11-12 DIAGNOSIS — K2210 Ulcer of esophagus without bleeding: Secondary | ICD-10-CM

## 2022-11-12 DIAGNOSIS — K208 Other esophagitis without bleeding: Secondary | ICD-10-CM

## 2022-11-12 DIAGNOSIS — I10 Essential (primary) hypertension: Secondary | ICD-10-CM

## 2022-11-12 DIAGNOSIS — R748 Abnormal levels of other serum enzymes: Secondary | ICD-10-CM

## 2022-11-12 DIAGNOSIS — Z87891 Personal history of nicotine dependence: Secondary | ICD-10-CM

## 2022-11-12 HISTORY — PX: BIOPSY: SHX5522

## 2022-11-12 HISTORY — PX: ESOPHAGOGASTRODUODENOSCOPY (EGD) WITH PROPOFOL: SHX5813

## 2022-11-12 LAB — CBC
HCT: 33.6 % — ABNORMAL LOW (ref 39.0–52.0)
Hemoglobin: 11.4 g/dL — ABNORMAL LOW (ref 13.0–17.0)
MCH: 30.2 pg (ref 26.0–34.0)
MCHC: 33.9 g/dL (ref 30.0–36.0)
MCV: 88.9 fL (ref 80.0–100.0)
Platelets: 137 10*3/uL — ABNORMAL LOW (ref 150–400)
RBC: 3.78 MIL/uL — ABNORMAL LOW (ref 4.22–5.81)
RDW: 15.8 % — ABNORMAL HIGH (ref 11.5–15.5)
WBC: 4.6 10*3/uL (ref 4.0–10.5)
nRBC: 0 % (ref 0.0–0.2)

## 2022-11-12 LAB — COMPREHENSIVE METABOLIC PANEL
ALT: 43 U/L (ref 0–44)
AST: 69 U/L — ABNORMAL HIGH (ref 15–41)
Albumin: 3.5 g/dL (ref 3.5–5.0)
Alkaline Phosphatase: 51 U/L (ref 38–126)
Anion gap: 8 (ref 5–15)
BUN: 8 mg/dL (ref 6–20)
CO2: 26 mmol/L (ref 22–32)
Calcium: 8.9 mg/dL (ref 8.9–10.3)
Chloride: 98 mmol/L (ref 98–111)
Creatinine, Ser: 0.95 mg/dL (ref 0.61–1.24)
GFR, Estimated: >60 mL/min (ref >60)
Glucose, Bld: 114 mg/dL — ABNORMAL HIGH (ref 70–99)
Potassium: 3.3 mmol/L — ABNORMAL LOW (ref 3.5–5.1)
Sodium: 132 mmol/L — ABNORMAL LOW (ref 135–145)
Total Bilirubin: 0.9 mg/dL (ref 0.3–1.2)
Total Protein: 7.1 g/dL (ref 6.5–8.1)

## 2022-11-12 LAB — CK: Total CK: 800 U/L — ABNORMAL HIGH (ref 49–397)

## 2022-11-12 LAB — PHOSPHORUS: Phosphorus: 3.7 mg/dL (ref 2.5–4.6)

## 2022-11-12 LAB — MAGNESIUM: Magnesium: 1.7 mg/dL (ref 1.7–2.4)

## 2022-11-12 SURGERY — ESOPHAGOGASTRODUODENOSCOPY (EGD) WITH PROPOFOL
Anesthesia: Monitor Anesthesia Care

## 2022-11-12 MED ORDER — PROPOFOL 500 MG/50ML IV EMUL
INTRAVENOUS | Status: AC
  Filled 2022-11-12: qty 50

## 2022-11-12 MED ORDER — COLCHICINE 0.6 MG PO TABS
0.6000 mg | ORAL_TABLET | Freq: Two times a day (BID) | ORAL | Status: AC
  Administered 2022-11-12 (×2): 0.6 mg via ORAL
  Filled 2022-11-12 (×2): qty 1

## 2022-11-12 MED ORDER — MAGNESIUM SULFATE 2 GM/50ML IV SOLN
2.0000 g | Freq: Once | INTRAVENOUS | Status: AC
  Administered 2022-11-12: 2 g via INTRAVENOUS
  Filled 2022-11-12: qty 50

## 2022-11-12 MED ORDER — LACTATED RINGERS IV SOLN: INTRAVENOUS | Status: DC | PRN

## 2022-11-12 MED ORDER — PREDNISONE 20 MG PO TABS
60.0000 mg | ORAL_TABLET | Freq: Every day | ORAL | Status: DC
  Administered 2022-11-13 – 2022-11-15 (×3): 60 mg via ORAL
  Filled 2022-11-12 (×3): qty 1

## 2022-11-12 MED ORDER — LIDOCAINE 2% (20 MG/ML) 5 ML SYRINGE
INTRAMUSCULAR | Status: DC | PRN
  Administered 2022-11-12: 100 mg via INTRAVENOUS
  Administered 2022-11-12: 60 mg via INTRAVENOUS

## 2022-11-12 MED ORDER — PROPOFOL 10 MG/ML IV BOLUS
INTRAVENOUS | Status: DC | PRN
  Administered 2022-11-12: 40 mg via INTRAVENOUS
  Administered 2022-11-12: 60 mg via INTRAVENOUS

## 2022-11-12 MED ORDER — POTASSIUM CHLORIDE 10 MEQ/100ML IV SOLN
10.0000 meq | INTRAVENOUS | Status: AC
  Administered 2022-11-12 (×4): 10 meq via INTRAVENOUS
  Filled 2022-11-12 (×4): qty 100

## 2022-11-12 MED ORDER — SODIUM CHLORIDE 0.9 % IV SOLN: INTRAVENOUS | Status: DC

## 2022-11-12 MED ORDER — PROPOFOL 500 MG/50ML IV EMUL
INTRAVENOUS | Status: DC | PRN
  Administered 2022-11-12: 150 ug/kg/min via INTRAVENOUS

## 2022-11-12 SURGICAL SUPPLY — 15 items

## 2022-11-12 NOTE — Progress Notes (Signed)
PROGRESS NOTE    Richard Lopez  TZG:[id number] DOB: 20-May-1976 DOA: 11/08/2022 PCP: [name], NP   Brief Narrative:  Patient is a 47 year old overweight male with a past medical history significant for but not limited to marijuana abuse, tobacco abuse, hypertension, GERD as well as other comorbidities who presented with nausea and vomiting for 4 days as well as decreased oral intake.  He is felt to be dehydrated with complaints of cramping myalgias all over.  He had a few streaks of blood with vomiting episodes but none 24 hours prior to presentation.  He was seen in the ED and had multiple electrolyte abnormalities and was noted to be dehydrated and was admitted for further workup.  His nausea vomiting is improving but given his inability to tolerate p.o. soft diet GI was consulted and he was taken for an EGD which showed severe erosive esophagitis and esophageal ulcers noted without evidence of any active bleeding.  She is now placed on PPI twice daily as well as sucralfate 1 g 4 times daily.  Now he is complaining of pain in his foot likely secondary to gout flare.  Assessment and Plan:  Nausea/vomiting likely in the setting of severe erosive esophagitis and complicated by marijuana abuse -??  Etiology -Patient admitted, improving clinically. -Tolerating clear liquids and subsequently full liquids, no further nausea or vomiting. -Patient improving clinically and will be on a soft diet. -Patient advanced to soft diet however complain of significant indigestion/heartburn and epigastric pain and as such has been limiting himself to liquid diet. -Consult GI for further evaluation and management and is found to have severe erosive esophagitis with esophageal ulcers noted. -IV antiemetics, supportive care -Will be placed back on a soft diet and GI recommending continuing present medications as well as PPI twice daily for 2 months and sucralfate 1 g 4 times daily for 2 weeks and awaiting  pathology results   Hypokalemia Hypomagnesemia Hyponatremia Hypophosphatemia -Likely secondary to GI losses from nausea and vomiting in the setting of dehydration. -Electrolyte Trend: Recent Labs  Lab 11/08/22 1049 11/08/22 1049 11/09/22 0121 11/09/22 0457 11/09/22 1129 11/10/22 0439 11/11/22 0440 11/12/22 0747  NA 124*  --  126* 127* 126* 128* 133* 132*  K 3.2*  --  2.8* 3.1* 2.9* 3.3* 3.4* 3.3*  MG 1.4*  --   --  1.4*  --  2.2 1.7 1.7  PHOS  --    < > 2.9 2.9 2.7 1.8* 3.6 3.7   < > = values in this interval not displayed.  -Replete with IV KCl 40 mill colons as well as IV mag sulfate 2 g and started on IV fluid hydration with normal saline at 75 MLS per hour -Continue to monitor and repeat labs in the a.m.   Hypocalcemia -Likely secondary to hypomagnesemia. -Repeat calcium level this morning was normal -Magnesium at 1.7 and will replete -Continue monitor and replete as necessary and repeat mag level in the a.m.   GERD/Reflux the setting of severe erosive esophagitis and esophageal ulcers with no bleeding -Patient with complaints of significant throat burning/indigestion, states GI cocktail helped.   -Patient also with some epigastric pain with solid foods.  Patient and as such has just been on a liquid diet himself. -Lipase level at 42. -Continue IV PPI twice daily. -Continue Carafate. -Consulted with GI for further evaluation and management, may need upper endoscopy for further evaluation versus empiric Diflucan.  No oral thrush noted. -EGD done and showed severe esophagitis with no  bleeding found 35 to 42 cm from the incisors he had many superficial esophageal ulcers with no bleeding and no stigmata of recent bleeding found.  The largest lesion was 6 mm in diameter and biopsies were taken with cold forceps for histology and the Z-line was irregular the mucosa appeared nodular and was found 42 cm from incisors.  Biopsies were taken and patient did also have erythematous  mucosa without bleeding found in the cardia and gastric fundus as well as in the gastric body and gastric antrum.  The examined duodenum was normal -GI cocktail as needed. -Appreciate GI Assistance and further evaluation   Dehydration and Elevated CK -Hydrated with IV fluids and will renew at 75 mL/hr  -CK Trend: 704 -> 744 -> 800 -Continue to Monitor and Trend   Hypertension -Continue Amlodipine 10 mg po Daily -Continue to Monitor BP per Protocol -Last BP Reading was 134/90   AKI -Likely secondary to prerenal azotemia in the setting of nausea vomiting. -Creatinine on admission was 3.17. -Urine sodium < 10. --BUN/Cr Trend: Recent Labs  Lab 11/08/22 1049 11/09/22 0121 11/09/22 0457 11/09/22 1129 11/10/22 0439 11/11/22 0440 11/12/22 0747  BUN 24* 25* 23* 20 15 9 8   CREATININE 3.17* 1.95* 1.66* 1.29* 0.84 0.90 0.95  -Urinalysis turbid, 5 ketones, trace leukocytes, protein > 300, 6-10 WBCs, no bacteria seen. -Abdominal ultrasound negative for hydronephrosis. -Renal function improving with hydration. -IV fluids with NS at 75 mL/hr being continued, supportive care given -Avoid Nephrotoxic Medications, Contrast Dyes, Hypotension and Dehydration to Ensure Adequate Renal Perfusion and will need to Renally Adjust Meds -Continue to Monitor and Trend Renal Function carefully and repeat CMP in the AM    Abnormal LFTs/Transaminitis -Likely secondary to fatty liver in the setting of dehydration. -Acute hepatitis panel negative. -HIV nonreactive. Recent Labs  Lab 11/08/22 1049 11/10/22 0439 11/12/22 0747  AST 50* 60* 69*  ALT 21 26 43  -Abdominal ultrasound with hepatic fatty infiltration, cholelithiasis. -Continue to Monitor and Trend Hepatic Fxn carefully and repeat CMP in the AM    Elevated CK -Saline lock IV fluids.   -Repeat labs in the AM.   Marijuana Abuse -Marijuana cessation stressed to patient.    Concern for Gout Flare -Check uric acid level and I am -Start  prednisone 60 mg p.o. daily short-term given his erosive esophagitis as well as colchicine 0.6 twice daily today  DVT prophylaxis: enoxaparin (LOVENOX) injection 40 mg Start: 11/09/22 2200 SCDs Start: 11/08/22 1819    Code Status: Full Code Family Communication: No family currently at bedside  Disposition Plan:  Level of care: Med-Surg Status is: Inpatient Remains inpatient appropriate because: Needs tolerance of a diet prior to safe discharge disposition   Consultants:  Gastroenterology  Procedures:  EGD Findings:      Severe esophagitis with no bleeding was found 35 to 42 cm from the       incisors.      Many superficial esophageal ulcers with no bleeding and no stigmata of       recent bleeding were found 35 to 42 cm from the incisors. The largest       lesion was 6 mm in largest dimension. Biopsies were taken with a cold       forceps for histology.      The Z-line was irregular, mucosa appeared nodular and was found 42 cm       from the incisors. Biopsies were taken with a cold forceps for histology.  Patchy erythematous mucosa without bleeding was found in the cardia, in       the gastric fundus, in the gastric body and in the gastric antrum.       Biopsies were taken with a cold forceps for Helicobacter pylori testing.      The examined duodenum was normal. Impression:               - Severe erosive esophagitis with no bleeding.                            Esophageal ulcers with no bleeding and no stigmata                            of recent bleeding. Biopsied.                           - Z-line irregular and nodular, 42 cm from the                            incisors. Biopsied.                           - Erythematous mucosa in the cardia, gastric                            fundus, gastric body and antrum. Biopsied.                           - Normal examined duodenum.  Antimicrobials:  Anti-infectives (From admission, onward)    None       Subjective: Seen  and examined at bedside was complaining of some pain in his knee and his toe and thinks his gout is flaring up.  Understands that he will be going for an EGD later this afternoon.  Was a little agitated but had no abdominal discomfort.  No other concerns or complaints at this time.  Objective: Vitals:   11/12/22 1229 11/12/22 1337 11/12/22 1350 11/12/22 1400  BP: (!) 148/91 (!) 160/78 (!) 153/74 (!) 141/70  Pulse:  86 66 64  Resp: 17 16 16  (!) 23  Temp: 97.8 F (36.6 C)     TempSrc: Tympanic     SpO2: 100% 100% 100% 100%  Weight:      Height:        Intake/Output Summary (Last 24 hours) at 11/12/2022 1700 Last data filed at 11/12/2022 1600 Gross per 24 hour  Intake 662.67 ml  Output 700 ml  Net -37.33 ml   Filed Weights   11/10/22 0449 11/11/22 0500 11/12/22 0652  Weight: 104 kg 102.6 kg 102.8 kg   Examination: Physical Exam:  Constitutional: WN/WD overweight male in no acute distress appears slightly agitated Respiratory: Diminished to auscultation bilaterally with coarse breath sounds, no wheezing, rales, rhonchi or crackles. Normal respiratory effort and patient is not tachypenic. No accessory muscle use.  Unlabored breathing Cardiovascular: RRR, no murmurs / rubs / gallops. S1 and S2 auscultated. No extremity edema Abdomen: Soft, non-tender, slightly distended secondary to body habitus. Bowel sounds positive.  GU: Deferred. Musculoskeletal: No clubbing / cyanosis of digits/nails. No joint deformity upper and lower extremities.  Skin: No rashes, lesions, ulcers limited skin evaluation.  No induration; Warm and dry.  Neurologic: CN 2-12 grossly intact with no focal deficits. Romberg sign and cerebellar reflexes not assessed.  Psychiatric: Normal judgment and insight. Alert and oriented x 3. Normal mood and appropriate affect.   Data Reviewed: I have personally reviewed following labs and imaging studies  CBC: Recent Labs  Lab 11/08/22 1049 11/09/22 0121 11/10/22 0439  11/11/22 0440 11/12/22 0747  WBC 13.7* 8.5 5.0 4.7 4.6  NEUTROABS  --   --   --  1.7  --   HGB 14.4 12.1* 11.0* 11.2* 11.4*  HCT 40.8 34.0* 32.7* 33.0* 33.6*  MCV 83.6 87.0 88.9 88.7 88.9  PLT 177 133* 122* 142* 137*   Basic Metabolic Panel: Recent Labs  Lab 11/08/22 1049 11/09/22 0121 11/09/22 0457 11/09/22 1129 11/10/22 0439 11/11/22 0440 11/12/22 0747  NA 124*   < > 127* 126* 128* 133* 132*  K 3.2*   < > 3.1* 2.9* 3.3* 3.4* 3.3*  CL 76*   < > 90* 88* 93* 98 98  CO2 27   < > 26 26 26 27 26   GLUCOSE 233*   < > 137* 156* 127* 112* 114*  BUN 24*   < > 23* 20 15 9 8   CREATININE 3.17*   < > 1.66* 1.29* 0.84 0.90 0.95  CALCIUM 9.3   < > 7.5* 7.5* 8.1* 8.5* 8.9  MG 1.4*  --  1.4*  --  2.2 1.7 1.7  PHOS  --    < > 2.9 2.7 1.8* 3.6 3.7   < > = values in this interval not displayed.   GFR: Estimated Creatinine Clearance: 126.9 mL/min (by C-G formula based on SCr of 0.95 mg/dL). Liver Function Tests: Recent Labs  Lab 11/08/22 1049 11/09/22 0121 11/09/22 0457 11/09/22 1129 11/10/22 0439 11/11/22 0440 11/12/22 0747  AST 50*  --   --   --  60*  --  69*  ALT 21  --   --   --  26  --  43  ALKPHOS 78  --   --   --  50  --  51  BILITOT 1.4*  --   --   --  1.0  --  0.9  PROT 9.6*  --   --   --  6.7  --  7.1  ALBUMIN 5.2*   < > 3.1* 3.4* 3.4* 3.0* 3.5   < > = values in this interval not displayed.   Recent Labs  Lab 11/08/22 1049 11/10/22 1510  LIPASE 38 42   No results for input(s): "AMMONIA" in the last 168 hours. Coagulation Profile: No results for input(s): "INR", "PROTIME" in the last 168 hours. Cardiac Enzymes: Recent Labs  Lab 11/08/22 1049 11/09/22 0121 11/10/22 0439 11/12/22 0747  CKTOTAL 521* 704* 744* 800*   BNP (last 3 results) No results for input(s): "PROBNP" in the last 8760 hours. HbA1C: No results for input(s): "HGBA1C" in the last 72 hours. CBG: Recent Labs  Lab 11/08/22 1029  GLUCAP 250*   Lipid Profile: No results for input(s):  "CHOL", "HDL", "LDLCALC", "TRIG", "CHOLHDL", "LDLDIRECT" in the last 72 hours. Thyroid Function Tests: No results for input(s): "TSH", "T4TOTAL", "FREET4", "T3FREE", "THYROIDAB" in the last 72 hours. Anemia Panel: No results for input(s): "VITAMINB12", "FOLATE", "FERRITIN", "TIBC", "IRON", "RETICCTPCT" in the last 72 hours. Sepsis Labs: Recent Labs  Lab 11/08/22 2004 11/09/22 0121  LATICACIDVEN 1.3 1.8    No results found for this or any previous visit (from the past  240 hour(s)).   Radiology Studies: No results found.  Scheduled Meds:  amLODipine  10 mg Oral Daily   colchicine  0.6 mg Oral BID   enoxaparin (LOVENOX) injection  40 mg Subcutaneous QHS   influenza vac split quadrivalent PF  0.5 mL Intramuscular Tomorrow-1000   pantoprazole (PROTONIX) IV  40 mg Intravenous Q12H   [START ON 11/13/2022] predniSONE  60 mg Oral Q breakfast   sucralfate  1 g Oral Q6H   Continuous Infusions:  sodium chloride 75 mL/hr at 11/12/22 1600    LOS: 4 days   Raiford Noble, DO [facility] Available via Epic secure chat 7am-7pm After these hours, please refer to coverage provider listed on [email or web address] 11/12/2022, 5:00 PM

## 2022-11-12 NOTE — Anesthesia Postprocedure Evaluation (Signed)
Anesthesia Post Note  Patient: Richard Lopez  Procedure(s) Performed: ESOPHAGOGASTRODUODENOSCOPY (EGD) WITH PROPOFOL BIOPSY     Patient location during evaluation: Endoscopy Anesthesia Type: MAC Level of consciousness: awake and alert Pain management: pain level controlled Vital Signs Assessment: post-procedure vital signs reviewed and stable Respiratory status: spontaneous breathing, nonlabored ventilation, respiratory function stable and patient connected to nasal cannula oxygen Cardiovascular status: blood pressure returned to baseline and stable Postop Assessment: no apparent nausea or vomiting Anesthetic complications: no  No notable events documented.  Last Vitals:  Vitals:   11/12/22 1350 11/12/22 1400  BP: (!) 153/74 (!) 141/70  Pulse: 66 64  Resp: 16 (!) 23  Temp:    SpO2: 100% 100%    Last Pain:  Vitals:   11/12/22 1400  TempSrc:   PainSc: 0-No pain                 Barnet Glasgow

## 2022-11-12 NOTE — Consult Note (Signed)
[facility]ology Consult  Referring Provider: No ref. provider found Primary Care Physician:  Kerin Perna, NP Primary Gastroenterologist: Althia Forts  Reason for Consultation: Hematemesis, dysphagia, odynophagia  HPI: Richard Lopez is a 47 y.o. male with history of marijuana use, significant alcohol use until 3 months ago(multiple glasses of vodka daily for 5 to 10 years) and GERD, presented to the ER with complaints of intractable nausea, vomiting. Patient reports noticing fresh blood as well as coffee-ground material on the vomitus, multiple episodes.  He developed weakness, generalized cramping and generalized muscle ache which prompted him to come to the ER where he was admitted with dehydration and electrolyte abnormalities. Patient reports of burning in his entire chest and epigastrium with eating as well as difficulty swallowing solids and liquids which are associated with painful swallowing. He has not had black stools or blood in his stools. Prior GI workup includes: EGD 08/2019: Esophagitis, reflux, possible EOE, biopsies not taken   Past Medical History:  Diagnosis Date   Anxiety    Arthritis    hands and possibly knee   Depression    Diabetes mellitus    diet controlled   Essential hypertension    Gastritis    Gout    Normocytic anemia due to blood loss 08/21/2019   Pulmonary embolism [facility])     Past Surgical History:  Procedure Laterality Date   ESOPHAGOGASTRODUODENOSCOPY (EGD) WITH PROPOFOL N/A 08/14/2019   Procedure: ESOPHAGOGASTRODUODENOSCOPY (EGD) WITH PROPOFOL;  Surgeon: Wilford Corner, MD;  Location: [facility];  Service: Endoscopy;  Laterality: N/A;   EXTERNAL FIXATION LEG Left 11/07/2018   Procedure: EXTERNAL FIXATION LEFT LOWER LEG;  Surgeon: Rod Can, MD;  Location: [facility];  Service: Orthopedics;  Laterality: Left;   EXTERNAL FIXATION REMOVAL Left 11/08/2018   Procedure: REMOVAL EXTERNAL FIXATION LEG;  Surgeon: Shona Needles, MD;   Location: [place];  Service: Orthopedics;  Laterality: Left;   INTRAMEDULLARY (IM) NAIL INTERTROCHANTERIC Right 03/26/2019   Procedure: INTRAMEDULLARY (IM) NAIL INTERTROCHANTRIC;  Surgeon: Shona Needles, MD;  Location: [place];  Service: Orthopedics;  Laterality: Right;   INTRAMEDULLARY (IM) NAIL INTERTROCHANTERIC Right 05/17/2019   Procedure: Intramedullary (Im) Nail Intertroch with circlage wiring;  Surgeon: Altamese Steele, MD;  Location: [place];  Service: Orthopedics;  Laterality: Right;   NO PAST SURGERIES     OPEN REDUCTION INTERNAL FIXATION (ORIF) TIBIA/FIBULA FRACTURE Left 11/08/2018   Procedure: OPEN REDUCTION INTERNAL FIXATION (ORIF) TIBIA/FIBULA FRACTURE;  Surgeon: Shona Needles, MD;  Location: [place];  Service: Orthopedics;  Laterality: Left;   ORIF FEMUR FRACTURE Right 05/17/2019   Procedure: REMOVAL  OF HARDWARE;  Surgeon: Altamese Williamsburg, MD;  Location: [place];  Service: Orthopedics;  Laterality: Right;    Prior to Admission medications   Medication Sig Start Date End Date Taking? Authorizing Provider  acetaminophen (TYLENOL) 500 MG tablet Take 500 mg by mouth every 6 (six) hours as needed for mild pain.   Yes [provider]  cetirizine (ZYRTEC) 10 MG tablet Take 10 mg by mouth daily as needed for allergies.   Yes [provider]  amLODipine (NORVASC) 10 MG tablet Take 1 tablet (10 mg total) by mouth daily. Patient not taking: Reported on 11/08/2022 04/30/21   Shawna Clamp, MD  pantoprazole (PROTONIX) 40 MG tablet Take 1 tablet (40 mg total) by mouth daily. Patient not taking: Reported on 11/08/2022 09/19/22 10/19/22  Sponseller, Eugene Garnet R, PA-C  sucralfate (CARAFATE) 1 GM/10ML suspension Take 10 mLs (1 g total) by mouth 4 (  four) times daily -  with meals and at bedtime. Patient not taking: Reported on 11/08/2022 09/19/22   Sponseller, Gypsy Balsam, PA-C    Current Facility-Administered Medications  Medication Dose Route Frequency Provider Last Rate Last Admin   0.9 %   sodium chloride infusion   Intravenous Continuous Ronnette Juniper, MD       0.9 %  sodium chloride infusion   Intravenous Continuous Raiford Noble Kiron, DO 75 mL/hr at 11/12/22 1024 New Bag at 11/12/22 1024   alum & mag hydroxide-simeth (MAALOX/MYLANTA) 200-200-20 MG/5ML suspension 30 mL  30 mL Oral Q6H PRN Eugenie Filler, MD   30 mL at 11/11/22 1859   And   lidocaine (XYLOCAINE) 2 % viscous mouth solution 15 mL  15 mL Oral Q6H PRN Eugenie Filler, MD   15 mL at 11/11/22 1859   amLODipine (NORVASC) tablet 10 mg  10 mg Oral Daily Marylyn Ishihara, Tyrone A, DO   10 mg at 11/12/22 4008   colchicine tablet 0.6 mg  0.6 mg Oral BID Sheikh, Omair Latif, DO       enoxaparin (LOVENOX) injection 40 mg  40 mg Subcutaneous QHS Eugenie Filler, MD   40 mg at 11/11/22 2154   influenza vac split quadrivalent PF (FLUARIX) injection 0.5 mL  0.5 mL Intramuscular Tomorrow-1000 Eugenie Filler, MD       Oral care mouth rinse  15 mL Mouth Rinse PRN Marylyn Ishihara, Tyrone A, DO       pantoprazole (PROTONIX) injection 40 mg  40 mg Intravenous Q12H Eugenie Filler, MD   40 mg at 11/12/22 6761   potassium chloride 10 mEq in 100 mL IVPB  10 mEq Intravenous Q1 Hr x 4 Raiford Noble Almond, DO 100 mL/hr at 11/12/22 1027 10 mEq at 11/12/22 1027   [START ON 11/13/2022] predniSONE (DELTASONE) tablet 60 mg  60 mg Oral Q breakfast Sheikh, Georgina Quint Latif, DO       prochlorperazine (COMPAZINE) injection 10 mg  10 mg Intravenous Q6H PRN Marylyn Ishihara, Tyrone A, DO   10 mg at 11/08/22 2141   sucralfate (CARAFATE) 1 GM/10ML suspension 1 g  1 g Oral Q6H Eugenie Filler, MD   1 g at 11/11/22 2357    Allergies as of 11/08/2022   (No Known Allergies)    Family History  Problem Relation Age of Onset   Diabetes Mellitus II Father    Diabetes Mellitus II Other    CAD Other     Social History   Socioeconomic History   Marital status: Single    Spouse name: Not on file   Number of children: Not on file   Years of education: Not on file   Highest  education level: Some college, no degree  Occupational History   Not on file  Tobacco Use   Smoking status: Former    Packs/day: 0.00    Types: Cigarettes   Smokeless tobacco: Never   Tobacco comments:    About 1 cigarette per day or less  Vaping Use   Vaping Use: Never used  Substance and Sexual Activity   Alcohol use: Yes    Alcohol/week: 200.0 standard drinks of alcohol    Types: 200 Cans of beer per week   Drug use: Yes    Types: Marijuana   Sexual activity: Yes  Other Topics Concern   Not on file  Social History Narrative   Not on file   Social Determinants of Health   Financial Resource Strain: Medium  Risk (11/07/2018)   Overall Financial Resource Strain (CARDIA)    Difficulty of Paying Living Expenses: Somewhat hard  Food Insecurity: No Food Insecurity (11/09/2022)   Hunger Vital Sign    Worried About Running Out of Food in the Last Year: Never true    Ran Out of Food in the Last Year: Never true  Transportation Needs: No Transportation Needs (11/09/2022)   PRAPARE - Administrator, Civil Service (Medical): No    Lack of Transportation (Non-Medical): No  Physical Activity: Unknown (11/07/2018)   Exercise Vital Sign    Days of Exercise per Week: Patient refused    Minutes of Exercise per Session: Patient refused  Stress: Stress Concern Present (11/07/2018)   [employer] of Occupational Health - Occupational Stress Questionnaire    Feeling of Stress : Rather much  Social Connections: Unknown (11/07/2018)   Social Connection and Isolation Panel [NHANES]    Frequency of Communication with Friends and Family: Patient refused    Frequency of Social Gatherings with Friends and Family: Patient refused    Attends Religious Services: Patient refused    Active Member of Clubs or Organizations: Patient refused    Attends Banker Meetings: Patient refused    Marital Status: Patient refused  Intimate Partner Violence: Unknown (11/09/2022)   Humiliation,  Afraid, Rape, and Kick questionnaire    Fear of Current or Ex-Partner: Patient refused    Emotionally Abused: Patient refused    Physically Abused: Patient refused    Sexually Abused: Patient refused    Review of Systems: Positive for: GI: Described in detail in HPI.    Gen: fatigue, weakness, malaise,  denies any fever, chills, rigors, night sweats, anorexia, involuntary weight loss and sleep disorder CV: chest pain, denies  angina, palpitations, syncope, orthopnea, PND, peripheral edema and claudication. Resp: Denies dyspnea, cough, sputum, wheezing, coughing up blood. GU : Denies urinary burning, blood in urine, urinary frequency, urinary hesitancy, nocturnal urination, and urinary incontinence. MS: muscle weakness, denies joint pain or swelling.  Denies cramps, atrophy.  Derm: Denies rash, itching, oral ulcerations, hives, unhealing ulcers.  Psych: Denies depression, anxiety, memory loss, suicidal ideation, hallucinations,  and confusion. Heme: Denies bruising, bleeding, and enlarged lymph nodes. Neuro:  Denies any headaches, dizziness, paresthesias. Endo:  Denies any problems with DM, thyroid, adrenal function.  Physical Exam: Vital signs in last 24 hours: Temp:  [97.4 F (36.3 C)-98.6 F (37 C)] 98.6 F (37 C) (01/10 1610) Pulse Rate:  [63-82] 82 (01/10 0652) Resp:  [18-20] 19 (01/10 0652) BP: (141-146)/(89-94) 146/90 (01/10 0652) SpO2:  [100 %] 100 % (01/10 9604) Weight:  [102.8 kg] 102.8 kg (01/10 0652) Last BM Date : 11/09/22  General:   Alert,  Well-developed, well-nourished, pleasant and cooperative in NAD Head:  Normocephalic and atraumatic. Eyes:  Sclera clear, no icterus.   Mild pallor  Ears:  Normal auditory acuity. Nose:  No deformity, discharge,  or lesions. Mouth:  No deformity or lesions.  Oropharynx pink & moist. Neck:  Supple; no masses or thyromegaly. Lungs:  Clear throughout to auscultation.   No wheezes, crackles, or rhonchi. No acute distress. Heart:   Regular rate and rhythm; no murmurs, clicks, rubs,  or gallops. Extremities:  Without clubbing or edema. Neurologic:  Alert and  oriented x4;  grossly normal neurologically. Skin:  Intact without significant lesions or rashes. Psych:  Alert and cooperative. Normal mood and affect. Abdomen:  Soft, nontender and nondistended. No masses, hepatosplenomegaly or hernias noted. Normal bowel  sounds, without guarding, and without rebound.         Lab Results: Recent Labs    11/10/22 0439 11/11/22 0440 11/12/22 0747  WBC 5.0 4.7 4.6  HGB 11.0* 11.2* 11.4*  HCT 32.7* 33.0* 33.6*  PLT 122* 142* 137*   BMET Recent Labs    11/10/22 0439 11/11/22 0440 11/12/22 0747  NA 128* 133* 132*  K 3.3* 3.4* 3.3*  CL 93* 98 98  CO2 26 27 26   GLUCOSE 127* 112* 114*  BUN 15 9 8   CREATININE 0.84 0.90 0.95  CALCIUM 8.1* 8.5* 8.9   LFT Recent Labs    11/12/22 0747  PROT 7.1  ALBUMIN 3.5  AST 69*  ALT 43  ALKPHOS 51  BILITOT 0.9   PT/INR No results for input(s): "LABPROT", "INR" in the last 72 hours.  Studies/Results: No results found.  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  Related to alcohol use, normal T. bili, ALT and ALP Hepatitis B and hepatitis C serology negative, HIV negative Ultrasound showed fatty infiltration, cholelithiasis, normal CBD  Plan: Diagnostic EGD today, depending on findings we will plan esophageal biopsies, possible balloon dilation if needed. The risks and the benefits of the procedure were discussed with the patient in details. He understands and verbalizes consent. He is on pain Tobrasol 40 mg every 12 hours and Carafate 1 g / 10 mL suspension every 6 hours.   LOS: 4 days    07-30-1977, MD  11/12/2022, 10:38 AM

## 2022-11-12 NOTE — Transfer of Care (Signed)
Immediate Anesthesia Transfer of Care Note  Patient: Richard Lopez  Procedure(s) Performed: ESOPHAGOGASTRODUODENOSCOPY (EGD) WITH PROPOFOL BIOPSY  Patient Location: PACU  Anesthesia Type:MAC  Level of Consciousness: awake, alert , and oriented  Airway & Oxygen Therapy: Patient Spontanous Breathing and Patient connected to face mask oxygen  Post-op Assessment: Report given to RN and Post -op Vital signs reviewed and stable  Post vital signs: Reviewed and stable  Last Vitals:  Vitals Value Taken Time  BP    Temp    Pulse 46 11/12/22 1336  Resp 19 11/12/22 1336  SpO2 100 % 11/12/22 1336  Vitals shown include unvalidated device data.  Last Pain:  Vitals:   11/12/22 1229  TempSrc: Tympanic  PainSc: 0-No pain      Patients Stated Pain Goal: 0 (74/14/23 9532)  Complications: No notable events documented.

## 2022-11-12 NOTE — Progress Notes (Signed)
.   Transition of Care [facility]) Screening Note   Patient Details  Name: Richard Lopez Date of Birth: 26-Nov-1975   [facility]) CM/SW Contact:    Illene Regulus, LCSW Phone Number: 11/12/2022, 12:53 PM    Transition of Care Department [facility]) has reviewed patient and no TOC needs have been identified at this time. We will continue to monitor patient advancement through interdisciplinary progression rounds. If new patient transition needs arise, please place a TOC consult.

## 2022-11-12 NOTE — Op Note (Signed)
[facility] Patient Name: Richard Lopez Procedure Date: 11/12/2022 MRN: [id number] Attending MD: Kerin Salen , MD, [id number] Date of Birth: [birth year] CSN: [id number] Age: 47 Admit Type: Outpatient Procedure:                Upper GI endoscopy Indications:              Dysphagia, Odynophagia, Hematemesis Providers:                Kerin Salen, MD, Almeta Monas,                            Technician, [facility], CRNA Referring MD:             [facility] Medicines:                Monitored Anesthesia Care Complications:            No immediate complications. Estimated blood loss:                            Minimal. Estimated Blood Loss:     Estimated blood loss was minimal. Procedure:                Pre-Anesthesia Assessment:                           - Prior to the procedure, a History and Physical                            was performed, and patient medications and                            allergies were reviewed. The patient's tolerance of                            previous anesthesia was also reviewed. The risks                            and benefits of the procedure and the sedation                            options and risks were discussed with the patient.                            All questions were answered, and informed consent                            was obtained. Prior Anticoagulants: The patient has                            taken no anticoagulant or antiplatelet agents. ASA                            Grade Assessment: III - A patient with severe  systemic disease. After reviewing the risks and                            benefits, the patient was deemed in satisfactory                            condition to undergo the procedure.                           After obtaining informed consent, the endoscope was                            passed under direct vision. Throughout the                             procedure, the patient's blood pressure, pulse, and                            oxygen saturations were monitored continuously. The                            [id number]-[id number] ([id number]) Olympus endoscope was introduced                            through the mouth, and advanced to the second part                            of duodenum. The upper GI endoscopy was                            accomplished without difficulty. The patient                            tolerated the procedure well. Scope In: Scope Out: Findings:      Severe esophagitis with no bleeding was found 35 to 42 cm from the       incisors.      Many superficial esophageal ulcers with no bleeding and no stigmata of       recent bleeding were found 35 to 42 cm from the incisors. The largest       lesion was 6 mm in largest dimension. Biopsies were taken with a cold       forceps for histology.      The Z-line was irregular, mucosa appeared nodular and was found 42 cm       from the incisors. Biopsies were taken with a cold forceps for histology.      Patchy erythematous mucosa without bleeding was found in the cardia, in       the gastric fundus, in the gastric body and in the gastric antrum.       Biopsies were taken with a cold forceps for Helicobacter pylori testing.      The examined duodenum was normal. Impression:               - Severe erosive esophagitis with no bleeding.  Esophageal ulcers with no bleeding and no stigmata                            of recent bleeding. Biopsied.                           - Z-line irregular and nodular, 42 cm from the                            incisors. Biopsied.                           - Erythematous mucosa in the cardia, gastric                            fundus, gastric body and antrum. Biopsied.                           - Normal examined duodenum. Moderate Sedation:      Patient did not receive moderate sedation for this procedure, but        instead received monitored anesthesia care. Recommendation:           - Mechanical soft diet.                           - Continue present medications. PPI BID for 2                            months and Sucralfate 1 gm QID for 2 weeks.                           - Await pathology results. Procedure Code(s):        --- Professional ---                           [phone], Esophagogastroduodenoscopy, flexible,                            transoral; with biopsy, single or multiple Diagnosis Code(s):        --- Professional ---                           K20.80, Other esophagitis without bleeding                           K22.10, Ulcer of esophagus without bleeding                           K22.89, Other specified disease of esophagus                           K31.89, Other diseases of stomach and duodenum                           R13.10, Dysphagia, unspecified  K92.0, Hematemesis CPT copyright 2022 American Medical Association. All rights reserved. The codes documented in this report are preliminary and upon coder review may  be revised to meet current compliance requirements. Ronnette Juniper, MD 11/12/2022 1:34:05 PM This report has been signed electronically. Number of Addenda: 0

## 2022-11-13 LAB — COMPREHENSIVE METABOLIC PANEL
ALT: 40 U/L (ref 0–44)
AST: 53 U/L — ABNORMAL HIGH (ref 15–41)
Albumin: 3.5 g/dL (ref 3.5–5.0)
Alkaline Phosphatase: 48 U/L (ref 38–126)
Anion gap: 7 (ref 5–15)
BUN: 10 mg/dL (ref 6–20)
CO2: 27 mmol/L (ref 22–32)
Calcium: 8.8 mg/dL — ABNORMAL LOW (ref 8.9–10.3)
Chloride: 100 mmol/L (ref 98–111)
Creatinine, Ser: 0.99 mg/dL (ref 0.61–1.24)
GFR, Estimated: >60 mL/min (ref >60)
Glucose, Bld: 117 mg/dL — ABNORMAL HIGH (ref 70–99)
Potassium: 3.7 mmol/L (ref 3.5–5.1)
Sodium: 134 mmol/L — ABNORMAL LOW (ref 135–145)
Total Bilirubin: 0.6 mg/dL (ref 0.3–1.2)
Total Protein: 6.4 g/dL — ABNORMAL LOW (ref 6.5–8.1)

## 2022-11-13 LAB — CBC WITH DIFFERENTIAL/PLATELET
Abs Immature Granulocytes: 0.01 10*3/uL (ref 0.00–0.07)
Basophils Absolute: 0 10*3/uL (ref 0.0–0.1)
Basophils Relative: 1 %
Eosinophils Absolute: 0.1 10*3/uL (ref 0.0–0.5)
Eosinophils Relative: 1 %
HCT: 33.3 % — ABNORMAL LOW (ref 39.0–52.0)
Hemoglobin: 11.3 g/dL — ABNORMAL LOW (ref 13.0–17.0)
Immature Granulocytes: 0 %
Lymphocytes Relative: 46 %
Lymphs Abs: 2 10*3/uL (ref 0.7–4.0)
MCH: 30 pg (ref 26.0–34.0)
MCHC: 33.9 g/dL (ref 30.0–36.0)
MCV: 88.3 fL (ref 80.0–100.0)
Monocytes Absolute: 0.6 10*3/uL (ref 0.1–1.0)
Monocytes Relative: 15 %
Neutro Abs: 1.6 10*3/uL — ABNORMAL LOW (ref 1.7–7.7)
Neutrophils Relative %: 37 %
Platelets: 166 10*3/uL (ref 150–400)
RBC: 3.77 MIL/uL — ABNORMAL LOW (ref 4.22–5.81)
RDW: 15.7 % — ABNORMAL HIGH (ref 11.5–15.5)
WBC: 4.3 10*3/uL (ref 4.0–10.5)
nRBC: 0 % (ref 0.0–0.2)

## 2022-11-13 LAB — MAGNESIUM: Magnesium: 1.8 mg/dL (ref 1.7–2.4)

## 2022-11-13 LAB — CK: Total CK: 397 U/L (ref 49–397)

## 2022-11-13 LAB — PHOSPHORUS: Phosphorus: 3.8 mg/dL (ref 2.5–4.6)

## 2022-11-13 LAB — URIC ACID: Uric Acid, Serum: 6.3 mg/dL (ref 3.7–8.6)

## 2022-11-13 MED ORDER — COLCHICINE 0.6 MG PO TABS
0.6000 mg | ORAL_TABLET | Freq: Every day | ORAL | Status: AC
  Administered 2022-11-13: 0.6 mg via ORAL
  Filled 2022-11-13: qty 1

## 2022-11-13 MED ORDER — MAGNESIUM SULFATE 2 GM/50ML IV SOLN
2.0000 g | Freq: Once | INTRAVENOUS | Status: AC
  Administered 2022-11-13: 2 g via INTRAVENOUS
  Filled 2022-11-13: qty 50

## 2022-11-13 MED ORDER — KETOROLAC TROMETHAMINE 15 MG/ML IJ SOLN
15.0000 mg | Freq: Once | INTRAMUSCULAR | Status: AC
  Administered 2022-11-13: 15 mg via INTRAVENOUS
  Filled 2022-11-13: qty 1

## 2022-11-13 NOTE — Progress Notes (Signed)
PT Cancellation Note  Patient Details Name: Richard Lopez MRN: [id number] DOB: 1976-04-20   Cancelled Treatment:    Reason Eval/Treat Not Completed: PT screened, no needs identified, will sign off RN reports pt is independent and no PT needs.  PT to sign off.   Kati L Payson 11/13/2022, 10:14 AM Arlyce Dice, DPT Physical Therapist Acute Rehabilitation Services Preferred contact method: Secure Chat Weekend Pager Only: [phone] Office: [phone]

## 2022-11-13 NOTE — Progress Notes (Signed)
PROGRESS NOTE    Richard Lopez  KZS:[id number] DOB: 03/20/1976 DOA: 11/08/2022 PCP: [name], NP   Brief Narrative:  Patient is a 47 year old overweight male with a past medical history significant for but not limited to marijuana abuse, tobacco abuse, hypertension, GERD as well as other comorbidities who presented with nausea and vomiting for 4 days as well as decreased oral intake. He is felt to be dehydrated with complaints of cramping myalgias all over. He had a few streaks of blood with vomiting episodes but none 24 hours prior to presentation. He was seen in the ED and had multiple electrolyte abnormalities and was noted to be dehydrated and was admitted for further workup. His nausea vomiting is improving but given his inability to tolerate p.o. soft diet GI was consulted and he was taken for an EGD which showed severe erosive esophagitis and esophageal ulcers noted without evidence of any active bleeding. She is now placed on PPI twice daily as well as sucralfate 1 g 4 times daily. Now he is complaining of pain in his foot likely secondary to gout flare.  Patient continues to have a very difficult time tolerating a meal and eating given recurrent pain symptoms upon eating.  Assessment and Plan: Nausea/vomiting likely in the setting of severe erosive esophagitis and complicated by marijuana abuse, improving Dysphagia from Above -??  Etiology -Patient admitted, improving clinically. -Tolerating clear liquids and subsequently full liquids, no further nausea or vomiting. -Patient improving clinically and will be on a soft diet. -Patient advanced to soft diet however complain of significant indigestion/heartburn and epigastric pain and as such has been limiting himself to liquid diet. -Consult GI for further evaluation and management and is found to have severe erosive esophagitis with esophageal ulcers noted. -IV antiemetics, supportive care -Will be placed back on a soft diet and GI  recommending continuing present medications as well as PPI twice daily for 2 months and sucralfate 1 g 4 times daily for 2 weeks and awaiting pathology results -Continues to have a very difficult time eating and feels that the food is very difficulty and burns when he goes down.  Will get SLP to further evaluate and treat to see what type of diet he needs but he currently remains on soft -Also will obtain dietary consult to see if patient will require supplementation   Hypokalemia Hypomagnesemia Hyponatremia Hypophosphatemia -Likely secondary to GI losses from nausea and vomiting in the setting of dehydration. -Electrolyte Trend: Recent Labs  Lab 11/09/22 0121 11/09/22 0457 11/09/22 1129 11/10/22 0439 11/11/22 0440 11/12/22 0747 11/13/22 0434  NA 126* 127* 126* 128* 133* 132* 134*  K 2.8* 3.1* 2.9* 3.3* 3.4* 3.3* 3.7  MG  --  1.4*  --  2.2 1.7 1.7 1.8  PHOS 2.9 2.9 2.7 1.8* 3.6 3.7 3.8  -IVF now to stopped  -Continue to monitor and repeat labs in the a.m.   Hypocalcemia -Likely secondary to hypomagnesemia. -Repeat calcium level this morning was low normal at 8.8 -Magnesium at 1.8 and will replete -Continue monitor and replete as necessary and repeat mag level in the a.m.   GERD/Reflux the setting of severe erosive esophagitis and esophageal ulcers with no bleeding -Patient with complaints of significant throat burning/indigestion, states GI cocktail helped.   -Patient also with some epigastric pain with solid foods.  Patient and as such has just been on a liquid diet himself. -Lipase level at 42. -Continue IV PPI twice daily. -Continue Carafate. -Consulted with GI for further evaluation  and management, may need upper endoscopy for further evaluation versus empiric Diflucan.  No oral thrush noted. -EGD done and showed severe esophagitis with no bleeding found 35 to 42 cm from the incisors he had many superficial esophageal ulcers with no bleeding and no stigmata of recent  bleeding found.  The largest lesion was 6 mm in diameter and biopsies were taken with cold forceps for histology and the Z-line was irregular the mucosa appeared nodular and was found 42 cm from incisors.  Biopsies were taken and patient did also have erythematous mucosa without bleeding found in the cardia and gastric fundus as well as in the gastric body and gastric antrum.  The examined duodenum was normal -GI cocktail as needed. -Appreciate GI Assistance and further evaluation   Dehydration and Elevated CK -Hydrated with IV fluids and was at 75 mL/hr but will now stop  -CK Trend: 704 -> 744 -> 800 -> 397 -Continue to Monitor and Trend   Hypertension -Continue Amlodipine 10 mg po Daily -Continue to Monitor BP per Protocol -Last BP Reading was 134/90   AKI, improved  -Likely secondary to prerenal azotemia in the setting of nausea vomiting. -Creatinine on admission was 3.17. -Urine sodium < 10. -BUN/Cr Trend: Recent Labs  Lab 11/09/22 0121 11/09/22 0457 11/09/22 1129 11/10/22 0439 11/11/22 0440 11/12/22 0747 11/13/22 0434  BUN 25* 23* 20 15 9 8 10   CREATININE 1.95* 1.66* 1.29* 0.84 0.90 0.95 0.99  -Urinalysis turbid, 5 ketones, trace leukocytes, protein > 300, 6-10 WBCs, no bacteria seen. -Abdominal ultrasound negative for hydronephrosis. -Renal function improving with hydration. -IV fluids with NS at 75 mL/hr being continued, supportive care given -Avoid Nephrotoxic Medications, Contrast Dyes, Hypotension and Dehydration to Ensure Adequate Renal Perfusion and will need to Renally Adjust Meds -Continue to Monitor and Trend Renal Function carefully and repeat CMP in the AM    Abnormal LFTs/Transaminitis -Likely secondary to fatty liver in the setting of dehydration. -Acute hepatitis panel negative. -HIV nonreactive. Recent Labs  Lab 11/08/22 1049 11/10/22 0439 11/12/22 0747 11/13/22 0434  AST 50* 60* 69* 53*  ALT 21 26 43 40  -Abdominal ultrasound with hepatic fatty  infiltration, cholelithiasis. -Continue to Monitor and Trend Hepatic Fxn carefully and repeat CMP in the AM     Marijuana Abuse -Marijuana cessation stressed to patient.    Suspected Gout Flare -Checked uric acid level and was 6.3 -Start prednisone 60 mg p.o. daily short-term given his erosive esophagitis as well as colchicine 0.6 twice daily yesterday and daily starting today -Given significant pain will give a 1x Dose of IV Ketorolac   DVT prophylaxis: enoxaparin (LOVENOX) injection 40 mg Start: 11/09/22 2200 SCDs Start: 11/08/22 1819    Code Status: Full Code Family Communication: No family currently at bedside  Disposition Plan:  Level of care: Med-Surg Status is: Inpatient Remains inpatient appropriate because: Needs to tolerate a diet prior to safe discharge disposition   Consultants:  Gastroenterology   Procedures:  EGD Findings:      Severe esophagitis with no bleeding was found 35 to 42 cm from the       incisors.      Many superficial esophageal ulcers with no bleeding and no stigmata of       recent bleeding were found 35 to 42 cm from the incisors. The largest       lesion was 6 mm in largest dimension. Biopsies were taken with a cold       forceps for histology.  The Z-line was irregular, mucosa appeared nodular and was found 42 cm       from the incisors. Biopsies were taken with a cold forceps for histology.      Patchy erythematous mucosa without bleeding was found in the cardia, in       the gastric fundus, in the gastric body and in the gastric antrum.       Biopsies were taken with a cold forceps for Helicobacter pylori testing.      The examined duodenum was normal. Impression:               - Severe erosive esophagitis with no bleeding.                            Esophageal ulcers with no bleeding and no stigmata                            of recent bleeding. Biopsied.                           - Z-line irregular and nodular, 42 cm from the                             incisors. Biopsied.                           - Erythematous mucosa in the cardia, gastric                            fundus, gastric body and antrum. Biopsied.                           - Normal examined duodenum.  Antimicrobials:  Anti-infectives (From admission, onward)    None       Subjective: Seen and examined at bedside and the patient was doing okay but still complaining of significant and painful swallowing.  States that he was only able to tolerate half of his mashed potatoes due to the pain and burning in his chest.  No lightheadedness or dizziness.  No complaint of pain in his hands and his feet and thinks his gout is getting worse.  No nausea or vomiting now but still has very difficult time tolerating his diet due to symptoms.  Objective: Vitals:   11/12/22 1707 11/12/22 2048 11/13/22 0453 11/13/22 1319  BP: (!) 134/90 116/73 138/87 130/79  Pulse: 64 79 72 71  Resp: 18 20 18 18   Temp: 97.9 F (36.6 C) 98.5 F (36.9 C) 98.6 F (37 C) 97.9 F (36.6 C)  TempSrc: Oral Oral Oral Oral  SpO2: 100% 100% 100% 100%  Weight:   101.2 kg   Height:        Intake/Output Summary (Last 24 hours) at 11/13/2022 1610 Last data filed at 11/13/2022 1512 Gross per 24 hour  Intake 2658.44 ml  Output 2000 ml  Net 658.44 ml   Filed Weights   11/11/22 0500 11/12/22 0652 11/13/22 0453  Weight: 102.6 kg 102.8 kg 101.2 kg   Examination: Physical Exam:  Constitutional: WN/WD overweight male in no acute distress Respiratory: Diminished to auscultation bilaterally, no wheezing, rales, rhonchi or crackles. Normal respiratory effort and patient is  not tachypenic. No accessory muscle use.  Unlabored breathing Cardiovascular: RRR, no murmurs / rubs / gallops. S1 and S2 auscultated. No extremity edema.  Abdomen: Soft, non-tender, mildly distended secondary to body habitus. Bowel sounds positive.  GU: Deferred. Musculoskeletal: No clubbing / cyanosis of digits/nails. No  joint deformity upper and lower extremities.  Skin: No rashes, lesions, ulcers on limited skin evaluation. No induration; Warm and dry.  Neurologic: CN 2-12 grossly intact with no focal deficits. Romberg sign and cerebellar reflexes not assessed.  Psychiatric: Normal judgment and insight. Alert and oriented x 3. Normal mood and appropriate affect.   Data Reviewed: I have personally reviewed following labs and imaging studies  CBC: Recent Labs  Lab 11/09/22 0121 11/10/22 0439 11/11/22 0440 11/12/22 0747 11/13/22 0434  WBC 8.5 5.0 4.7 4.6 4.3  NEUTROABS  --   --  1.7  --  1.6*  HGB 12.1* 11.0* 11.2* 11.4* 11.3*  HCT 34.0* 32.7* 33.0* 33.6* 33.3*  MCV 87.0 88.9 88.7 88.9 88.3  PLT 133* 122* 142* 137* 166   Basic Metabolic Panel: Recent Labs  Lab 11/09/22 0457 11/09/22 1129 11/10/22 0439 11/11/22 0440 11/12/22 0747 11/13/22 0434  NA 127* 126* 128* 133* 132* 134*  K 3.1* 2.9* 3.3* 3.4* 3.3* 3.7  CL 90* 88* 93* 98 98 100  CO2 26 26 26 27 26 27   GLUCOSE 137* 156* 127* 112* 114* 117*  BUN 23* 20 15 9 8 10   CREATININE 1.66* 1.29* 0.84 0.90 0.95 0.99  CALCIUM 7.5* 7.5* 8.1* 8.5* 8.9 8.8*  MG 1.4*  --  2.2 1.7 1.7 1.8  PHOS 2.9 2.7 1.8* 3.6 3.7 3.8   GFR: Estimated Creatinine Clearance: 121.8 mL/min (by C-G formula based on SCr of 0.99 mg/dL). Liver Function Tests: Recent Labs  Lab 11/08/22 1049 11/09/22 0121 11/09/22 1129 11/10/22 0439 11/11/22 0440 11/12/22 0747 11/13/22 0434  AST 50*  --   --  60*  --  69* 53*  ALT 21  --   --  26  --  43 40  ALKPHOS 78  --   --  50  --  51 48  BILITOT 1.4*  --   --  1.0  --  0.9 0.6  PROT 9.6*  --   --  6.7  --  7.1 6.4*  ALBUMIN 5.2*   < > 3.4* 3.4* 3.0* 3.5 3.5   < > = values in this interval not displayed.   Recent Labs  Lab 11/08/22 1049 11/10/22 1510  LIPASE 38 42   No results for input(s): "AMMONIA" in the last 168 hours. Coagulation Profile: No results for input(s): "INR", "PROTIME" in the last 168 hours. Cardiac  Enzymes: Recent Labs  Lab 11/08/22 1049 11/09/22 0121 11/10/22 0439 11/12/22 0747 11/13/22 1027  CKTOTAL 521* 704* 744* 800* 397   BNP (last 3 results) No results for input(s): "PROBNP" in the last 8760 hours. HbA1C: No results for input(s): "HGBA1C" in the last 72 hours. CBG: Recent Labs  Lab 11/08/22 1029  GLUCAP 250*   Lipid Profile: No results for input(s): "CHOL", "HDL", "LDLCALC", "TRIG", "CHOLHDL", "LDLDIRECT" in the last 72 hours. Thyroid Function Tests: No results for input(s): "TSH", "T4TOTAL", "FREET4", "T3FREE", "THYROIDAB" in the last 72 hours. Anemia Panel: No results for input(s): "VITAMINB12", "FOLATE", "FERRITIN", "TIBC", "IRON", "RETICCTPCT" in the last 72 hours. Sepsis Labs: Recent Labs  Lab 11/08/22 2004 11/09/22 0121  LATICACIDVEN 1.3 1.8    No results found for this or any previous visit (from the  past 240 hour(s)).   Radiology Studies: No results found.  Scheduled Meds:  amLODipine  10 mg Oral Daily   enoxaparin (LOVENOX) injection  40 mg Subcutaneous QHS   influenza vac split quadrivalent PF  0.5 mL Intramuscular Tomorrow-1000   pantoprazole (PROTONIX) IV  40 mg Intravenous Q12H   predniSONE  60 mg Oral Q breakfast   sucralfate  1 g Oral Q6H   Continuous Infusions:   LOS: 5 days   Raiford Noble, DO [facility] Available via Epic secure chat 7am-7pm After these hours, please refer to coverage provider listed on [email or web address] 11/13/2022, 4:10 PM

## 2022-11-13 NOTE — Progress Notes (Signed)
Mobility Specialist - Progress Note   11/13/22 1058  Mobility  Activity Ambulated independently in hallway  Level of Assistance Independent  Assistive Device None  Distance Ambulated (ft) 1050 ft  Range of Motion/Exercises Active  Activity Response Tolerated well  Mobility Referral Yes  $Mobility charge 1 Mobility   Pt was found in bed and agreeable to ambulate. C/o knee pain during ambulation and at EOS returned to recliner chair with all necessities.  Ferd Hibbs Mobility Specialist

## 2022-11-13 NOTE — Progress Notes (Signed)
OT Cancellation Note  Patient Details Name: Richard Lopez MRN: [id number] DOB: December 10, 1975   Cancelled Treatment:    Reason Eval/Treat Not Completed: OT screened, no needs identified, will sign off  Vertis Scheib L Roscoe Witts 11/13/2022, 1:07 PM

## 2022-11-13 NOTE — TOC Initial Note (Signed)
Transition of Care [facility]) - Initial/Assessment Note    Patient Details  Name: Richard Lopez MRN: [id number] Date of Birth: 16-Mar-1976  [facility]) CM/SW Contact:    Illene Regulus, LCSW Phone Number: 11/13/2022, 10:41 AM  Clinical Narrative:                 CSW spoke with pt, who reported he spoke with someone in the ED, and they told him that he could apply for Medicaid at the hospital. CSW informed pt he cannot apply for Medicaid here at the hospital, and that he would need to speak with social services to apply for this. Pt discussed that he is waiting for his disability to be approved. CSW added social services to pt's AVS. No additional TOC needs.    Expected Discharge Plan: Home/Self Care Barriers to Discharge: Continued Medical Work up   Patient Goals and CMS Choice Patient states their goals for this hospitalization and ongoing recovery are:: return home          Expected Discharge Plan and Services                                              Prior Living Arrangements/Services   Lives with:: Self Patient language and need for interpreter reviewed:: Yes Do you feel safe going back to the place where you live?: Yes      Need for Family Participation in Patient Care: No (Comment) Care giver support system in place?: No (comment)   Criminal Activity/Legal Involvement Pertinent to Current Situation/Hospitalization: No - Comment as needed  Activities of Daily Living Home Assistive Devices/Equipment: Blood pressure cuff, Bedside commode/3-in-1, Crutches ADL Screening (condition at time of admission) Patient's cognitive ability adequate to safely complete daily activities?: Yes Is the patient deaf or have difficulty hearing?: No Does the patient have difficulty seeing, even when wearing glasses/contacts?: No Does the patient have difficulty concentrating, remembering, or making decisions?: Yes Patient able to express need for assistance  with ADLs?: Yes Does the patient have difficulty dressing or bathing?: Yes Independently performs ADLs?: Yes (appropriate for developmental age) Does the patient have difficulty walking or climbing stairs?: Yes Weakness of Legs: Both Weakness of Arms/Hands: None  Permission Sought/Granted                  Emotional Assessment Appearance:: Appears stated age Attitude/Demeanor/Rapport: Gracious Affect (typically observed): Accepting Orientation: : Oriented to Self, Oriented to Place, Oriented to  Time, Oriented to Situation   Psych Involvement: No (comment)  Admission diagnosis:  Dehydration [E86.0] Intractable nausea and vomiting [R11.2] Patient Active Problem List   Diagnosis Date Noted   Hypocalcemia 11/09/2022   Acute renal failure ([place]) 11/09/2022   Elevated CPK 11/08/2022   Elevated LFTs 11/08/2022   Intractable nausea and vomiting 02/12/2021   AF (paroxysmal atrial fibrillation) (HCC)    Transaminitis    Dehydration    AKI (acute kidney injury) ([place]) 06/18/2020   Atrial fibrillation with RVR ([place]) 04/22/2020   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   Hyponatremia 04/21/2020   Aspiration pneumonia ([place]) 04/21/2020   Esophagitis determined by endoscopy 08/21/2019   Anemia 08/21/2019   Hypokalemia 08/17/2019   Hypomagnesemia 08/17/2019   Hemoptysis 08/17/2019   Acute upper GI bleed 08/14/2019   Hematemesis 08/13/2019   Cavitary lesion of lung 08/01/2019   Gout 05/18/2019  Peri-prosthetic fracture of femur at tip of prosthesis 05/16/2019   Alcohol use 05/16/2019   Intertrochanteric fracture of right hip ([place]) 04/14/2019   Vitamin D deficiency 04/14/2019   DTs (delirium tremens) ([place]) 04/07/2019   Delirium tremens ([place]) 04/07/2019   Encephalopathy acute    Closed right hip fracture, initial encounter ([place]) 03/25/2019   Alcohol dependence with intoxication ([place]) 03/25/2019   Hypertensive urgency 03/25/2019   Thrombocytopenia ([place]) 03/25/2019   Effusion, right  knee 03/25/2019   Closed fracture of right femur (HCC)    High anion gap metabolic acidosis    Prediabetes 04/02/2017   Gastritis 04/02/2017   Marijuana abuse 04/01/2017   Nausea with vomiting 07/29/2014   Type 2 diabetes mellitus with hyperlipidemia ([place]) 07/29/2014   Essential hypertension 07/29/2014   Nausea & vomiting 07/29/2014   PCP:  Kerin Perna, NP Pharmacy:   [facility], [address] [place] [address] Phone: [phone] Fax: [place]), [place] - [address] [place] ([place]) [place] [address] Phone: [phone] Fax: [phone]     Social Determinants of Health (SDOH) Social History: SDOH Screenings   Food Insecurity: No Food Insecurity (11/09/2022)  Housing: Low Risk  (11/09/2022)  Transportation Needs: No Transportation Needs (11/09/2022)  Utilities: Not At Risk (11/09/2022)  Depression (PHQ2-9): Low Risk  (11/07/2020)  Financial Resource Strain: Medium Risk (11/07/2018)  Physical Activity: Unknown (11/07/2018)  Social Connections: Unknown (11/07/2018)  Stress: Stress Concern Present (11/07/2018)  Tobacco Use: Medium Risk (11/12/2022)   SDOH Interventions:     Readmission Risk Interventions     No data to display

## 2022-11-13 NOTE — Progress Notes (Signed)
Mobility Specialist - Progress Note   11/13/22 1517  Mobility  Activity Ambulated independently in hallway  Level of Assistance Independent  Assistive Device None  Distance Ambulated (ft) 1400 ft  Range of Motion/Exercises Active  Activity Response Tolerated well  Mobility Referral Yes  $Mobility charge 1 Mobility   Pt was found on recliner chair and agreeable to ambulate. Had no complaints and at EOS returned to room with all necessities.   Ferd Hibbs Mobility Specialist

## 2022-11-14 ENCOUNTER — Encounter (HOSPITAL_COMMUNITY): Payer: Self-pay | Admitting: Gastroenterology

## 2022-11-14 LAB — COMPREHENSIVE METABOLIC PANEL
ALT: 32 U/L (ref 0–44)
AST: 38 U/L (ref 15–41)
Albumin: 3.2 g/dL — ABNORMAL LOW (ref 3.5–5.0)
Alkaline Phosphatase: 44 U/L (ref 38–126)
Anion gap: 9 (ref 5–15)
BUN: 11 mg/dL (ref 6–20)
CO2: 24 mmol/L (ref 22–32)
Calcium: 8.7 mg/dL — ABNORMAL LOW (ref 8.9–10.3)
Chloride: 99 mmol/L (ref 98–111)
Creatinine, Ser: 1.01 mg/dL (ref 0.61–1.24)
GFR, Estimated: >60 mL/min (ref >60)
Glucose, Bld: 122 mg/dL — ABNORMAL HIGH (ref 70–99)
Potassium: 3.6 mmol/L (ref 3.5–5.1)
Sodium: 132 mmol/L — ABNORMAL LOW (ref 135–145)
Total Bilirubin: 0.5 mg/dL (ref 0.3–1.2)
Total Protein: 6.1 g/dL — ABNORMAL LOW (ref 6.5–8.1)

## 2022-11-14 LAB — CBC WITH DIFFERENTIAL/PLATELET
Abs Immature Granulocytes: 0.01 10*3/uL (ref 0.00–0.07)
Basophils Absolute: 0 10*3/uL (ref 0.0–0.1)
Basophils Relative: 0 %
Eosinophils Absolute: 0 10*3/uL (ref 0.0–0.5)
Eosinophils Relative: 0 %
HCT: 31.1 % — ABNORMAL LOW (ref 39.0–52.0)
Hemoglobin: 10.4 g/dL — ABNORMAL LOW (ref 13.0–17.0)
Immature Granulocytes: 0 %
Lymphocytes Relative: 41 %
Lymphs Abs: 2.1 10*3/uL (ref 0.7–4.0)
MCH: 30.2 pg (ref 26.0–34.0)
MCHC: 33.4 g/dL (ref 30.0–36.0)
MCV: 90.4 fL (ref 80.0–100.0)
Monocytes Absolute: 0.7 10*3/uL (ref 0.1–1.0)
Monocytes Relative: 14 %
Neutro Abs: 2.3 10*3/uL (ref 1.7–7.7)
Neutrophils Relative %: 45 %
Platelets: 191 10*3/uL (ref 150–400)
RBC: 3.44 MIL/uL — ABNORMAL LOW (ref 4.22–5.81)
RDW: 15.8 % — ABNORMAL HIGH (ref 11.5–15.5)
WBC: 5.1 10*3/uL (ref 4.0–10.5)
nRBC: 0 % (ref 0.0–0.2)

## 2022-11-14 LAB — MAGNESIUM: Magnesium: 1.7 mg/dL (ref 1.7–2.4)

## 2022-11-14 LAB — SURGICAL PATHOLOGY

## 2022-11-14 LAB — PHOSPHORUS: Phosphorus: 2.8 mg/dL (ref 2.5–4.6)

## 2022-11-14 MED ORDER — ADULT MULTIVITAMIN W/MINERALS CH
1.0000 | ORAL_TABLET | Freq: Every day | ORAL | Status: DC
  Administered 2022-11-14 – 2022-11-17 (×4): 1 via ORAL
  Filled 2022-11-14 (×4): qty 1

## 2022-11-14 MED ORDER — ENSURE ENLIVE PO LIQD
237.0000 mL | Freq: Three times a day (TID) | ORAL | Status: DC
  Administered 2022-11-14 – 2022-11-17 (×9): 237 mL via ORAL

## 2022-11-14 MED ORDER — MAGNESIUM SULFATE 2 GM/50ML IV SOLN
2.0000 g | Freq: Once | INTRAVENOUS | Status: AC
  Administered 2022-11-14: 2 g via INTRAVENOUS
  Filled 2022-11-14: qty 50

## 2022-11-14 NOTE — Progress Notes (Signed)
Initial Nutrition Assessment  DOCUMENTATION CODES:   Not applicable  INTERVENTION:  - DYS 1 diet per MD, advance as able to Regular.  - Ensure Plus High Protein po BID, each supplement provides 350 kcal and 20 grams of protein. - Encourage intake as tolerated.  - Daily multivitamin to support micronutrient needs. - Monitor weight trends.    NUTRITION DIAGNOSIS:   Inadequate oral intake related to acute illness (severe erosive esophagitis and esophageal ulcers) as evidenced by meal completion < 25%.  GOAL:   Patient will meet greater than or equal to 90% of their needs  MONITOR:   PO intake, Diet advancement, Supplement acceptance, Weight trends, Labs  REASON FOR ASSESSMENT:   Consult Assessment of nutrition requirement/status  ASSESSMENT:     47 year old male with a PMH significant for but not limited to marijuana abuse, tobacco abuse, HTN, GERD as well as other comorbidities who presented with nausea and vomiting for 4 days as well as decreased oral intake. Found to have severe erosive esophagitis and esophageal ulcers.  Patient reports UBW of 225-230# and weight loss over the past week since N/V started. Per EMR, patient weighed in November at 240# and initially weighed at 220# this admission but weight since that time has fluctuated between 220-230#.   Patient reports he usually eats 2 meals a day but since January 2nd he wasn't able to keep anything down before presenting to the hospital. Since admission, his diet has slowly been advancing, currently on DYS 1 and patient reports tolerating well. Still endorses throat pain and that the food seems to cause burning when it enters the middle of his chest. He is noted to have esophageal ulcers and esophagitis. Admits he hasn't been taking in a lot of solids but notes water and [place] ice seems to go down well. He is documented to be consuming only 0-20% of meals. Patient is agreeable to try Ensure to support intake as he notes he  will do what he can to start getting more nutrition in.    Medications reviewed and include: Carafate, Zofran and Compazine prn  Labs reviewed:  Na 132 HA1C 6.0   NUTRITION - FOCUSED PHYSICAL EXAM:  Flowsheet Row Most Recent Value  Orbital Region Mild depletion  Upper Arm Region Mild depletion  Thoracic and Lumbar Region No depletion  Buccal Region No depletion  Temple Region Mild depletion  Clavicle Bone Region Mild depletion  Clavicle and Acromion Bone Region Mild depletion  Scapular Bone Region Unable to assess  Dorsal Hand No depletion  Patellar Region No depletion  Anterior Thigh Region No depletion  Posterior Calf Region Mild depletion  Edema (RD Assessment) None  Hair Reviewed  Eyes Reviewed  Mouth Reviewed  Skin Reviewed  Nails Reviewed       Diet Order:   Diet Order             DIET - DYS 1 Room service appropriate? Yes; Fluid consistency: Thin  Diet effective now                   EDUCATION NEEDS:  Education needs have been addressed  Skin:  Skin Assessment: Reviewed RN Assessment  Last BM:  1/10  Height:  Ht Readings from Last 1 Encounters:  11/09/22 6\' 6"  (1.981 m)   Weight:  Wt Readings from Last 1 Encounters:  11/14/22 104.4 kg    BMI:  Body mass index is 26.6 kg/m.  Estimated Nutritional Needs:  Kcal:  2500-2700 kcals Protein:  115-135 grams Fluid:  >/= 2.5L    Samson Frederic RD, LDN For contact information, refer to [facility].

## 2022-11-14 NOTE — Progress Notes (Signed)
FINAL MICROSCOPIC DIAGNOSIS:   A. STOMACH, ANTRUM AND BODY, BIOPSY:  - Gastric antral and oxyntic mucosa with features of reactive  gastropathy  - Negative for H. pylori on HE stain  - Negative for intestinal metaplasia or malignancy   B. GE JUNCTION, NODULAR MUCOSAL, BIOPSY:  - Gastroesophageal junctional mucosa with no specific pathologic changes   C. ESOPHAGUS, BIOPSY:  - Acute esophagitis with ulceration  - Negative for fungal organisms and viral cytopathic effect on HE (see  comment)  - Negative for dysplasia or malignancy   Discussed about biopsies results with the patient over the phone. He will need to be on PPI BID for 2 months and sucralfate 1 gm four times a day for 2 weeks. Ok for mechanical soft diet and d/c home form GI standpoint.

## 2022-11-14 NOTE — Progress Notes (Signed)
PROGRESS NOTE    Richard Lopez  NIO:[id number] DOB: 1976-02-14 DOA: 11/08/2022 PCP: [name], NP   Brief Narrative:  Patient is a 47 year old overweight male with a past medical history significant for but not limited to marijuana abuse, tobacco abuse, hypertension, GERD as well as other comorbidities who presented with nausea and vomiting for 4 days as well as decreased oral intake. He is felt to be dehydrated with complaints of cramping myalgias all over. He had a few streaks of blood with vomiting episodes but none 24 hours prior to presentation. He was seen in the ED and had multiple electrolyte abnormalities and was noted to be dehydrated and was admitted for further workup. His nausea vomiting is improving but given his inability to tolerate p.o. soft diet GI was consulted and he was taken for an EGD which showed severe erosive esophagitis and esophageal ulcers noted without evidence of any active bleeding. She is now placed on PPI twice daily as well as sucralfate 1 g 4 times daily. Now he is complaining of pain in his foot likely secondary to gout flare.  Patient continues to have a very difficult time tolerating a meal and eating given recurrent pain symptoms upon eating so SLP is evaluating for what type of diet he should be on.  GI feels he can be on a soft diet but patient continues to have symptoms.  Nutritionist consulted for further evaluation and recommending Ensure Plus high-protein p.o. twice daily as well as daily multivitamin.  Biopsy results from the stomach antrum and body as well as GE junction as well as esophagus and resulted in and showed features of reactive gastropathy and acute esophagitis with ulceration with no fungal organisms.  GI spoke with the patient and they are recommending continue PPI twice daily for 2 months and sucralfate 4 times a day for 2 weeks.  Patient continues to have pain so we will continue to monitor to see if we can discharge him in the next 24  to 48 hours.  Assessment and Plan:  Nausea/vomiting likely in the setting of severe erosive esophagitis and complicated by marijuana abuse, improving Dysphagia from Above -??  Etiology and because of his esophagitis with ulcerations -Patient admitted, improving clinically. -Tolerating clear liquids and subsequently full liquids, no further nausea or vomiting. -Patient improving clinically and will be on a soft diet. -Patient advanced to soft diet however complain of significant indigestion/heartburn and epigastric pain and as such has been limiting himself to liquid diet. -Consult GI for further evaluation and management and is found to have severe erosive esophagitis with esophageal ulcers noted. -IV antiemetics, supportive care -Will be placed back on a soft diet and GI recommending continuing present medications as well as PPI twice daily for 2 months and sucralfate 1 g 4 times daily for 2 weeks and awaiting pathology results -Continues to have a very difficult time eating and feels that the food is very difficulty and burns when he goes down.  Will get SLP to further evaluate and treat to see what type of diet he needs but he currently remains on soft and this is still pending to be done -Also will obtain dietary consult to see if patient will require supplementation and they have seen the patient.   Hypokalemia Hypomagnesemia Hyponatremia Hypophosphatemia -Likely secondary to GI losses from nausea and vomiting in the setting of dehydration. -Electrolyte Trend: Recent Labs  Lab 11/09/22 0457 11/09/22 1129 11/10/22 8182 11/11/22 0440 11/12/22 0747 11/13/22 0434 11/14/22 0416  NA 127* 126* 128* 133* 132* 134* 132*  K 3.1* 2.9* 3.3* 3.4* 3.3* 3.7 3.6  MG 1.4*  --  2.2 1.7 1.7 1.8 1.7  PHOS 2.9 2.7 1.8* 3.6 3.7 3.8 2.8  -IVF now to stopped  -Continue to monitor and repeat labs in the a.m.   Hypocalcemia -Likely secondary to hypomagnesemia. -Repeat calcium level this morning  was low normal at 8.7 -Magnesium at 1.7 and will replete -Continue monitor and replete as necessary and repeat mag level in the a.m.   GERD/Reflux the setting of severe erosive esophagitis and esophageal ulcers with no bleeding -Patient with complaints of significant throat burning/indigestion, states GI cocktail helped.   -Patient also with some epigastric pain with solid foods.  Patient and as such has just been on a liquid diet himself. -Lipase level at 42. -Continue IV PPI twice daily. -Continue Carafate. -Consulted with GI for further evaluation and management, may need upper endoscopy for further evaluation versus empiric Diflucan.  No oral thrush noted. -EGD done and showed severe esophagitis with no bleeding found 35 to 42 cm from the incisors he had many superficial esophageal ulcers with no bleeding and no stigmata of recent bleeding found.  The largest lesion was 6 mm in diameter and biopsies were taken with cold forceps for histology and the Z-line was irregular the mucosa appeared nodular and was found 42 cm from incisors.  Biopsies were taken and patient did also have erythematous mucosa without bleeding found in the cardia and gastric fundus as well as in the gastric body and gastric antrum.  The examined duodenum was normal -Biopsy results have resulted and the GI physician has spoken with the patient about the results -GI cocktail as needed. -Appreciate GI Assistance and further evaluation and they feel that he can be discharged from their standpoint but patient continues to have significant symptoms we will continue to monitor to see if he can get his symptoms little bit better before discharge   Dehydration and Elevated CK -Hydrated with IV fluids and was at 75 mL/hr but will now stop  -CK Trend: 704 -> 744 -> 800 -> 397 -Continue to Monitor and Trend   Hypertension -Continue Amlodipine 10 mg po Daily -Continue to Monitor BP per Protocol -Last BP Reading was evaded at  164/99   AKI, improved  -Likely secondary to prerenal azotemia in the setting of nausea vomiting. -Creatinine on admission was 3.17. -Urine sodium < 10. -BUN/Cr Trend: Recent Labs  Lab 11/09/22 0457 11/09/22 1129 11/10/22 0439 11/11/22 0440 11/12/22 0747 11/13/22 0434 11/14/22 0416  BUN 23* 20 15 9 8 10 11   CREATININE 1.66* 1.29* 0.84 0.90 0.95 0.99 1.01  -Urinalysis turbid, 5 ketones, trace leukocytes, protein > 300, 6-10 WBCs, no bacteria seen. -Abdominal ultrasound negative for hydronephrosis. -Renal function improving with hydration. -IV fluids with NS at 75 mL/hr being continued, supportive care given -Avoid Nephrotoxic Medications, Contrast Dyes, Hypotension and Dehydration to Ensure Adequate Renal Perfusion and will need to Renally Adjust Meds -Continue to Monitor and Trend Renal Function carefully and repeat CMP in the AM    Abnormal LFTs/Transaminitis -Likely secondary to fatty liver in the setting of dehydration. -Acute hepatitis panel negative. -HIV nonreactive. Recent Labs  Lab 11/08/22 1049 11/10/22 0439 11/12/22 0747 11/13/22 0434 11/14/22 0416  AST 50* 60* 69* 53* 38  ALT 21 26 43 40 32  -Abdominal ultrasound with hepatic fatty infiltration, cholelithiasis. -Continue to Monitor and Trend Hepatic Fxn carefully and repeat CMP in the AM  Marijuana Abuse -Marijuana cessation stressed to patient.   Normocytic Anemia -Hgb/Hct Trend: Recent Labs  Lab 11/08/22 1049 11/09/22 0121 11/10/22 0439 11/11/22 0440 11/12/22 0747 11/13/22 0434 11/14/22 0416  HGB 14.4 12.1* 11.0* 11.2* 11.4* 11.3* 10.4*  HCT 40.8 34.0* 32.7* 33.0* 33.6* 33.3* 31.1*  MCV 83.6 87.0 88.9 88.7 88.9 88.3 90.4  -Check Anemia Panel in the AM -Continue to Monitor for S/Sx of Bleeding; No overt bleeding noted -Repeat CBC in the AM    Suspected Gout Flare -Checked uric acid level and was 6.3 -Start prednisone 60 mg p.o. daily short-term given his erosive esophagitis as well as  colchicine 0.6 daily -Given significant pain will give a 1x Dose of IV Ketorolac -Will start weaning prednisone tomorrow  DVT prophylaxis: enoxaparin (LOVENOX) injection 40 mg Start: 11/09/22 2200 SCDs Start: 11/08/22 1819    Code Status: Full Code Family Communication: No family currently at bedside  Disposition Plan:  Level of care: Med-Surg Status is: Inpatient Remains inpatient appropriate because: Will get SLP to evaluate prior to discharging given that he continues to have significant symptoms and pain upon eating   Consultants:  Gastroenterology  Procedures:  EGD Findings:      Severe esophagitis with no bleeding was found 35 to 42 cm from the       incisors.      Many superficial esophageal ulcers with no bleeding and no stigmata of       recent bleeding were found 35 to 42 cm from the incisors. The largest       lesion was 6 mm in largest dimension. Biopsies were taken with a cold       forceps for histology.      The Z-line was irregular, mucosa appeared nodular and was found 42 cm       from the incisors. Biopsies were taken with a cold forceps for histology.      Patchy erythematous mucosa without bleeding was found in the cardia, in       the gastric fundus, in the gastric body and in the gastric antrum.       Biopsies were taken with a cold forceps for Helicobacter pylori testing.      The examined duodenum was normal. Impression:               - Severe erosive esophagitis with no bleeding.                            Esophageal ulcers with no bleeding and no stigmata                            of recent bleeding. Biopsied.                           - Z-line irregular and nodular, 42 cm from the                            incisors. Biopsied.                           - Erythematous mucosa in the cardia, gastric                            fundus, gastric  body and antrum. Biopsied.                           - Normal examined duodenum.    Antimicrobials:   Anti-infectives (From admission, onward)    None       Subjective: Richard Lopez at bedside and states that he was able to eat some of his dinner last night but not very much but continues to have symptoms and had pain.  States that he just wanted to rest given that he had some abdominal discomfort and continues to have difficulty swallowing food.  He is ambulating quite well.  SLP to further evaluate today.  No other concerns or complaints at this time.  Objective: Vitals:   11/13/22 2151 11/14/22 0500 11/14/22 0614 11/14/22 1327  BP: 125/80  115/64 (!) 164/99  Pulse: 61  60 77  Resp: 17  18 (!) 21  Temp: 98.7 F (37.1 C)  97.9 F (36.6 C) 98.2 F (36.8 C)  TempSrc: Oral  Oral Oral  SpO2: 100%  100% 100%  Weight:  104.4 kg    Height:        Intake/Output Summary (Last 24 hours) at 11/14/2022 1506 Last data filed at 11/14/2022 0830 Gross per 24 hour  Intake 563.66 ml  Output 425 ml  Net 138.66 ml   Filed Weights   11/12/22 0652 11/13/22 0453 11/14/22 0500  Weight: 102.8 kg 101.2 kg 104.4 kg   Examination: Physical Exam:  Constitutional: WN/WD overweight male in no acute dress wanting to rest Respiratory: Diminished to auscultation bilaterally, no wheezing, rales, rhonchi or crackles. Normal respiratory effort and patient is not tachypenic. No accessory muscle use.  Unlabored breathing Cardiovascular: RRR, no murmurs / rubs / gallops. S1 and S2 auscultated. No extremity edema.  Abdomen: Soft, mildly-tender, slightly-distended. Bowel sounds positive.  GU: Deferred. Musculoskeletal: No clubbing / cyanosis of digits/nails. No joint deformity upper and lower extremities.  Skin: No rashes, lesions, ulcers on limited skin evaluation. No induration; Warm and dry.  Neurologic: CN 2-12 grossly intact with no focal deficits. Romberg sign and cerebellar reflexes not assessed.  Psychiatric: Normal judgment and insight. Alert and oriented x 3. Normal mood and appropriate affect.   Data  Reviewed: I have personally reviewed following labs and imaging studies  CBC: Recent Labs  Lab 11/10/22 0439 11/11/22 0440 11/12/22 0747 11/13/22 0434 11/14/22 0416  WBC 5.0 4.7 4.6 4.3 5.1  NEUTROABS  --  1.7  --  1.6* 2.3  HGB 11.0* 11.2* 11.4* 11.3* 10.4*  HCT 32.7* 33.0* 33.6* 33.3* 31.1*  MCV 88.9 88.7 88.9 88.3 90.4  PLT 122* 142* 137* 166 [id number]   Basic Metabolic Panel: Recent Labs  Lab 11/10/22 0439 11/11/22 0440 11/12/22 0747 11/13/22 0434 11/14/22 0416  NA 128* 133* 132* 134* 132*  K 3.3* 3.4* 3.3* 3.7 3.6  CL 93* 98 98 100 99  CO2 26 27 26 27 24   GLUCOSE 127* 112* 114* 117* 122*  BUN 15 9 8 10 11   CREATININE 0.84 0.90 0.95 0.99 1.01  CALCIUM 8.1* 8.5* 8.9 8.8* 8.7*  MG 2.2 1.7 1.7 1.8 1.7  PHOS 1.8* 3.6 3.7 3.8 2.8   GFR: Estimated Creatinine Clearance: 119.4 mL/min (by C-G formula based on SCr of 1.01 mg/dL). Liver Function Tests: Recent Labs  Lab 11/08/22 1049 11/09/22 0121 11/10/22 0439 11/11/22 0440 11/12/22 0747 11/13/22 0434 11/14/22 0416  AST 50*  --  60*  --  69* 53* 38  ALT 21  --  26  --  43 40 32  ALKPHOS 78  --  50  --  51 48 44  BILITOT 1.4*  --  1.0  --  0.9 0.6 0.5  PROT 9.6*  --  6.7  --  7.1 6.4* 6.1*  ALBUMIN 5.2*   < > 3.4* 3.0* 3.5 3.5 3.2*   < > = values in this interval not displayed.   Recent Labs  Lab 11/08/22 1049 11/10/22 1510  LIPASE 38 42   No results for input(s): "AMMONIA" in the last 168 hours. Coagulation Profile: No results for input(s): "INR", "PROTIME" in the last 168 hours. Cardiac Enzymes: Recent Labs  Lab 11/08/22 1049 11/09/22 0121 11/10/22 0439 11/12/22 0747 11/13/22 1027  CKTOTAL 521* 704* 744* 800* 397   BNP (last 3 results) No results for input(s): "PROBNP" in the last 8760 hours. HbA1C: No results for input(s): "HGBA1C" in the last 72 hours. CBG: Recent Labs  Lab 11/08/22 1029  GLUCAP 250*   Lipid Profile: No results for input(s): "CHOL", "HDL", "LDLCALC", "TRIG", "CHOLHDL",  "LDLDIRECT" in the last 72 hours. Thyroid Function Tests: No results for input(s): "TSH", "T4TOTAL", "FREET4", "T3FREE", "THYROIDAB" in the last 72 hours. Anemia Panel: No results for input(s): "VITAMINB12", "FOLATE", "FERRITIN", "TIBC", "IRON", "RETICCTPCT" in the last 72 hours. Sepsis Labs: Recent Labs  Lab 11/08/22 2004 11/09/22 0121  LATICACIDVEN 1.3 1.8   No results found for this or any previous visit (from the past 240 hour(s)).   Radiology Studies: No results found.  Scheduled Meds:  amLODipine  10 mg Oral Daily   enoxaparin (LOVENOX) injection  40 mg Subcutaneous QHS   feeding supplement  237 mL Oral TID BM   influenza vac split quadrivalent PF  0.5 mL Intramuscular Tomorrow-1000   multivitamin with minerals  1 tablet Oral Daily   pantoprazole (PROTONIX) IV  40 mg Intravenous Q12H   predniSONE  60 mg Oral Q breakfast   sucralfate  1 g Oral Q6H   Continuous Infusions:   LOS: 6 days   Raiford Noble, DO [facility] Available via Epic secure chat 7am-7pm After these hours, please refer to coverage provider listed on [email or web address] 11/14/2022, 3:06 PM
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
## 2022-11-14 NOTE — Progress Notes (Signed)
Mobility Specialist - Progress Note   11/14/22 1135  Mobility  Activity Ambulated independently in hallway  Level of Assistance Independent  Assistive Device None  Distance Ambulated (ft) 1400 ft  Range of Motion/Exercises Active  Activity Response Tolerated well  Mobility Referral Yes  $Mobility charge 1 Mobility   Pt was found in bed and agreeable to ambulate. Stated that his knees where hurting prior to ambulation and also stated having some chest pain. At EOS returned to room with all necessities.  Ferd Hibbs Mobility Specialist

## 2022-11-14 NOTE — Progress Notes (Signed)
Mobility Specialist - Progress Note   11/14/22 1519  Mobility  Activity Ambulated independently in hallway  Level of Assistance Independent  Assistive Device None  Distance Ambulated (ft) 1750 ft  Range of Motion/Exercises Active  Activity Response Tolerated well  Mobility Referral Yes  $Mobility charge 1 Mobility   Pt was found on recliner chair and agreeable to ambulate. Had no complaints and at EOS returned to room with all necessities.  Ferd Hibbs Mobility Specialist

## 2022-11-15 ENCOUNTER — Other Ambulatory Visit (HOSPITAL_COMMUNITY): Payer: Self-pay

## 2022-11-15 LAB — CBC WITH DIFFERENTIAL/PLATELET
Abs Immature Granulocytes: 0.02 10*3/uL (ref 0.00–0.07)
Basophils Absolute: 0 10*3/uL (ref 0.0–0.1)
Basophils Relative: 0 %
Eosinophils Absolute: 0.1 10*3/uL (ref 0.0–0.5)
Eosinophils Relative: 1 %
HCT: 31.6 % — ABNORMAL LOW (ref 39.0–52.0)
Hemoglobin: 10.5 g/dL — ABNORMAL LOW (ref 13.0–17.0)
Immature Granulocytes: 0 %
Lymphocytes Relative: 45 %
Lymphs Abs: 2.5 10*3/uL (ref 0.7–4.0)
MCH: 30 pg (ref 26.0–34.0)
MCHC: 33.2 g/dL (ref 30.0–36.0)
MCV: 90.3 fL (ref 80.0–100.0)
Monocytes Absolute: 0.8 10*3/uL (ref 0.1–1.0)
Monocytes Relative: 14 %
Neutro Abs: 2.2 10*3/uL (ref 1.7–7.7)
Neutrophils Relative %: 40 %
Platelets: 242 10*3/uL (ref 150–400)
RBC: 3.5 MIL/uL — ABNORMAL LOW (ref 4.22–5.81)
RDW: 15.5 % (ref 11.5–15.5)
WBC: 5.6 10*3/uL (ref 4.0–10.5)
nRBC: 0 % (ref 0.0–0.2)

## 2022-11-15 LAB — COMPREHENSIVE METABOLIC PANEL
ALT: 33 U/L (ref 0–44)
AST: 39 U/L (ref 15–41)
Albumin: 3.1 g/dL — ABNORMAL LOW (ref 3.5–5.0)
Alkaline Phosphatase: 46 U/L (ref 38–126)
Anion gap: 9 (ref 5–15)
BUN: 12 mg/dL (ref 6–20)
CO2: 25 mmol/L (ref 22–32)
Calcium: 8.8 mg/dL — ABNORMAL LOW (ref 8.9–10.3)
Chloride: 100 mmol/L (ref 98–111)
Creatinine, Ser: 0.99 mg/dL (ref 0.61–1.24)
GFR, Estimated: >60 mL/min (ref >60)
Glucose, Bld: 172 mg/dL — ABNORMAL HIGH (ref 70–99)
Potassium: 3.4 mmol/L — ABNORMAL LOW (ref 3.5–5.1)
Sodium: 134 mmol/L — ABNORMAL LOW (ref 135–145)
Total Bilirubin: 0.4 mg/dL (ref 0.3–1.2)
Total Protein: 6 g/dL — ABNORMAL LOW (ref 6.5–8.1)

## 2022-11-15 LAB — RETICULOCYTES
Immature Retic Fract: 5.8 % (ref 2.3–15.9)
RBC.: 4 MIL/uL — ABNORMAL LOW (ref 4.22–5.81)
Retic Count, Absolute: 74.4 10*3/uL (ref 19.0–186.0)
Retic Ct Pct: 1.9 % (ref 0.4–3.1)

## 2022-11-15 LAB — PHOSPHORUS: Phosphorus: 2.9 mg/dL (ref 2.5–4.6)

## 2022-11-15 LAB — MAGNESIUM: Magnesium: 1.7 mg/dL (ref 1.7–2.4)

## 2022-11-15 MED ORDER — PREDNISONE 10 MG PO TABS
ORAL_TABLET | ORAL | 0 refills | Status: DC
  Filled 2022-11-15: qty 21, 6d supply, fill #0

## 2022-11-15 MED ORDER — ADULT MULTIVITAMIN W/MINERALS CH
1.0000 | ORAL_TABLET | Freq: Every day | ORAL | 0 refills | Status: DC
  Filled 2022-11-15: qty 30, 30d supply, fill #0

## 2022-11-15 MED ORDER — ENSURE ENLIVE PO LIQD
237.0000 mL | Freq: Three times a day (TID) | ORAL | 12 refills | Status: DC
  Filled 2022-11-15: qty 237, 1d supply, fill #0

## 2022-11-15 MED ORDER — PREDNISONE 50 MG PO TABS
50.0000 mg | ORAL_TABLET | Freq: Every day | ORAL | Status: DC
  Administered 2022-11-16: 50 mg via ORAL
  Filled 2022-11-15: qty 1

## 2022-11-15 MED ORDER — SUCRALFATE 1 G PO TABS
1.0000 g | ORAL_TABLET | Freq: Four times a day (QID) | ORAL | 0 refills | Status: DC
  Filled 2022-11-15: qty 56, 14d supply, fill #0
  Filled 2022-11-15: qty 120, 3d supply, fill #0

## 2022-11-15 MED ORDER — AMLODIPINE BESYLATE 10 MG PO TABS
10.0000 mg | ORAL_TABLET | Freq: Every day | ORAL | 1 refills | Status: DC
  Filled 2022-11-15: qty 30, 30d supply, fill #0

## 2022-11-15 MED ORDER — ALUM & MAG HYDROXIDE-SIMETH 200-200-20 MG/5ML PO SUSP
30.0000 mL | Freq: Four times a day (QID) | ORAL | 0 refills | Status: DC | PRN
  Filled 2022-11-15: qty 355, 3d supply, fill #0

## 2022-11-15 MED ORDER — MAGNESIUM SULFATE 2 GM/50ML IV SOLN
2.0000 g | Freq: Once | INTRAVENOUS | Status: AC
  Administered 2022-11-15: 2 g via INTRAVENOUS
  Filled 2022-11-15: qty 50

## 2022-11-15 MED ORDER — LIDOCAINE VISCOUS HCL 2 % MT SOLN
15.0000 mL | Freq: Four times a day (QID) | OROMUCOSAL | 0 refills | Status: DC | PRN
  Filled 2022-11-15: qty 100, 2d supply, fill #0

## 2022-11-15 MED ORDER — PANTOPRAZOLE SODIUM 40 MG PO TBEC
40.0000 mg | DELAYED_RELEASE_TABLET | Freq: Two times a day (BID) | ORAL | 1 refills | Status: DC
  Filled 2022-11-15: qty 60, 30d supply, fill #0

## 2022-11-15 MED ORDER — POTASSIUM CHLORIDE CRYS ER 20 MEQ PO TBCR
40.0000 meq | EXTENDED_RELEASE_TABLET | Freq: Two times a day (BID) | ORAL | Status: AC
  Administered 2022-11-15 (×2): 40 meq via ORAL
  Filled 2022-11-15 (×2): qty 2

## 2022-11-15 NOTE — TOC Progression Note (Signed)
Transition of Care [facility]) - Progression Note    Patient Details  Name: Richard Lopez MRN: [id number] Date of Birth: February 06, 1976  Transition of Care [facility]) CM/SW Contact  Henrietta Dine, RN Phone Number: 11/15/2022, 2:49 PM  Clinical Narrative:    TOC notified of pt's need for ? MATCH; spoke w/ patient in room; the pt says he does not work and his medicaid is pending; he says can afford to pay $3 per med; explained to pt [place] service allowed once per year; pt's medication total per pharmacy $41.26 (spoke w/ Murray Hodgkins at [place]); pt says he would rather pay out of pocket in case he needs assistance at a later time; no TOC needs.  Expected Discharge Plan: Home/Self Care Barriers to Discharge: Continued Medical Work up  Expected Discharge Plan and Services                                               Social Determinants of Health (SDOH) Interventions SDOH Screenings   Food Insecurity: No Food Insecurity (11/09/2022)  Housing: Low Risk  (11/09/2022)  Transportation Needs: No Transportation Needs (11/09/2022)  Utilities: Not At Risk (11/09/2022)  Depression (PHQ2-9): Low Risk  (11/07/2020)  Financial Resource Strain: Medium Risk (11/07/2018)  Physical Activity: Unknown (11/07/2018)  Social Connections: Unknown (11/07/2018)  Stress: Stress Concern Present (11/07/2018)  Tobacco Use: Medium Risk (11/14/2022)    Readmission Risk Interventions     No data to display

## 2022-11-15 NOTE — Progress Notes (Signed)
Mobility Specialist - Progress Note   11/15/22 1132  Mobility  Activity Ambulated independently in hallway  Level of Assistance Independent  Assistive Device None  Distance Ambulated (ft) 1050 ft  Range of Motion/Exercises Active  Activity Response Tolerated well  Mobility Referral Yes  $Mobility charge 1 Mobility   Pt was found on recliner chair wanting to ambulate. Had no complaints and at EOS returned to room with all necessities in reach.  Ferd Hibbs Mobility Specialist

## 2022-11-15 NOTE — Plan of Care (Signed)
  Problem: Education: Goal: Knowledge of General Education information will improve Description: Including pain rating scale, medication(s)/side effects and non-pharmacologic comfort measures Outcome: Progressing   Problem: Health Behavior/Discharge Planning: Goal: Ability to manage health-related needs will improve Outcome: Progressing

## 2022-11-15 NOTE — Progress Notes (Signed)
PROGRESS NOTE    Richard Lopez  WJX:[id number] DOB: 11-Aug-1976 DOA: 11/08/2022 PCP: Kerin Perna, NP   Brief Narrative:  Patient is a 47 year old overweight male with a past medical history significant for but not limited to marijuana abuse, tobacco abuse, hypertension, GERD as well as other comorbidities who presented with nausea and vomiting for 4 days as well as decreased oral intake. He is felt to be dehydrated with complaints of cramping myalgias all over. He had a few streaks of blood with vomiting episodes but none 24 hours prior to presentation. He was seen in the ED and had multiple electrolyte abnormalities and was noted to be dehydrated and was admitted for further workup. His nausea vomiting is improving but given his inability to tolerate p.o. soft diet GI was consulted and he was taken for an EGD which showed severe erosive esophagitis and esophageal ulcers noted without evidence of any active bleeding. She is now placed on PPI twice daily as well as sucralfate 1 g 4 times daily. Now he is complaining of pain in his foot likely secondary to gout flare.  Patient continues to have a very difficult time tolerating a meal and eating given recurrent pain symptoms upon eating so SLP is evaluating for what type of diet he should be on.  GI feels he can be on a soft diet but patient continues to have symptoms.  Nutritionist consulted for further evaluation and recommending Ensure Plus high-protein p.o. twice daily as well as daily multivitamin.   Biopsy results from the stomach antrum and body as well as GE junction as well as esophagus and resulted in and showed features of reactive gastropathy and acute esophagitis with ulceration with no fungal organisms.  GI spoke with the patient and they are recommending continue PPI twice daily for 2 months and sucralfate 4 times a day for 2 weeks.  Patient continues to have pain so we will continue to monitor to see if we can discharge him in the next 24  to 48 hours.  Assessment and Plan:  Nausea/vomiting likely in the setting of severe erosive esophagitis and complicated by marijuana abuse, improving Dysphagia from Above -??  Etiology and because of his esophagitis with ulcerations -Patient admitted, improving clinically. -Tolerating clear liquids and subsequently full liquids, no further nausea or vomiting. -Patient improving clinically and will be on a soft diet. -Patient advanced to soft diet however complain of significant indigestion/heartburn and epigastric pain and as such has been limiting himself to liquid diet. -Consult GI for further evaluation and management and is found to have severe erosive esophagitis with esophageal ulcers noted. -IV antiemetics, supportive care -Will be placed back on a soft diet and GI recommending continuing present medications as well as PPI twice daily for 2 months and sucralfate 1 g 4 times daily for 2 weeks and awaiting pathology results -Continues to have a very difficult time eating and feels that the food is very difficulty and burns when he goes down.  Will get SLP to further evaluate and treat to see what type of diet he and they recommending continuing D3 Diet and they are recommending the patient try supplements as needed as well as try to eat small frequent meals and they have given him written strategies to try and combat his oral discomfort -Also will obtain dietary consult to see if patient will require supplementation and they have seen the patient.   Hypokalemia Hypomagnesemia Hyponatremia Hypophosphatemia -Likely secondary to GI losses from nausea and vomiting  in the setting of dehydration. -Electrolyte Trend: Recent Labs  Lab 11/09/22 1129 11/10/22 0439 11/11/22 0440 11/12/22 0747 11/13/22 0434 11/14/22 0416 11/15/22 0549  NA 126* 128* 133* 132* 134* 132* 134*  K 2.9* 3.3* 3.4* 3.3* 3.7 3.6 3.4*  MG  --  2.2 1.7 1.7 1.8 1.7 1.7  PHOS 2.7 1.8* 3.6 3.7 3.8 2.8 2.9  -Replete  Potassium and Mag today  -IVF now to stopped  -Continue to monitor and repeat labs in the a.m.   Hypocalcemia -Likely secondary to hypomagnesemia. -Repeat calcium level this morning was low normal at 8.8 -Magnesium at 1.7 and will replete -Continue monitor and replete as necessary and repeat mag level in the a.m.   GERD/Reflux the setting of severe erosive esophagitis and esophageal ulcers with no bleeding -Patient with complaints of significant throat burning/indigestion, states GI cocktail helped.   -Patient also with some epigastric pain with solid foods.  Patient and as such has just been on a liquid diet himself. -Lipase level at 42. -Continue IV PPI twice daily. -Continue Carafate. -Consulted with GI for further evaluation and management, may need upper endoscopy for further evaluation versus empiric Diflucan.  No oral thrush noted. -EGD done and showed severe esophagitis with no bleeding found 35 to 42 cm from the incisors he had many superficial esophageal ulcers with no bleeding and no stigmata of recent bleeding found.  The largest lesion was 6 mm in diameter and biopsies were taken with cold forceps for histology and the Z-line was irregular the mucosa appeared nodular and was found 42 cm from incisors.  Biopsies were taken and patient did also have erythematous mucosa without bleeding found in the cardia and gastric fundus as well as in the gastric body and gastric antrum.  The examined duodenum was normal -Biopsy results have resulted and the GI physician has spoken with the patient about the results -GI cocktail as needed. -Appreciate GI Assistance and further evaluation and they feel that he can be discharged from their standpoint but patient continues to have significant symptoms we will continue to monitor to see if he can get his symptoms little bit better before discharge -Continues to have symptoms so have added Xyloxaine   Dehydration and Elevated CK -Hydrated with IV  fluids and was at 75 mL/hr but will now stop  -CK Trend: 704 -> 744 -> 800 -> 397 -Continue to Monitor and Trend   Hypertension -Continue Amlodipine 10 mg po Daily -Continue to Monitor BP per Protocol -Last BP Reading was improved to 111/84   AKI, improved  -Likely secondary to prerenal azotemia in the setting of nausea vomiting. -Creatinine on admission was 3.17. -Urine sodium < 10. -BUN/Cr Trend: Recent Labs  Lab 11/09/22 1129 11/10/22 0439 11/11/22 0440 11/12/22 0747 11/13/22 0434 11/14/22 0416 11/15/22 0549  BUN 20 15 9 8 10 11 12   CREATININE 1.29* 0.84 0.90 0.95 0.99 1.01 0.99  -Urinalysis turbid, 5 ketones, trace leukocytes, protein > 300, 6-10 WBCs, no bacteria seen. -Abdominal ultrasound negative for hydronephrosis. -Renal function improving with hydration. -IV fluids with NS at 75 mL/hr being continued, supportive care given -Avoid Nephrotoxic Medications, Contrast Dyes, Hypotension and Dehydration to Ensure Adequate Renal Perfusion and will need to Renally Adjust Meds -Continue to Monitor and Trend Renal Function carefully and repeat CMP in the AM    Abnormal LFTs/Transaminitis -Likely secondary to fatty liver in the setting of dehydration. -Acute hepatitis panel negative. -HIV nonreactive. Recent Labs  Lab 11/08/22 1049 11/10/22 0439 11/12/22 0747  11/13/22 0434 11/14/22 0416 11/15/22 0549  AST 50* 60* 69* 53* 38 39  ALT 21 26 43 40 32 33  -Abdominal ultrasound with hepatic fatty infiltration, cholelithiasis. -Continue to Monitor and Trend Hepatic Fxn carefully and repeat CMP in the AM     Marijuana Abuse -Marijuana cessation stressed to patient.   Hypoalbuminemia -Patient's Albumin Level went from 3.4 -> 3.0 -> 3.5 x2 -> 3.2 -> 3.1 -Continue to Monitor and Trend and repeat CMP in the AM   Normocytic Anemia -Hgb/Hct Trend: Recent Labs  Lab 11/09/22 0121 11/10/22 0439 11/11/22 0440 11/12/22 0747 11/13/22 0434 11/14/22 0416 11/15/22 0549  HGB  12.1* 11.0* 11.2* 11.4* 11.3* 10.4* 10.5*  HCT 34.0* 32.7* 33.0* 33.6* 33.3* 31.1* 31.6*  MCV 87.0 88.9 88.7 88.9 88.3 90.4 90.3  -Check Anemia Panel in the AM -Continue to Monitor for S/Sx of Bleeding; No overt bleeding noted -Repeat CBC in the AM    Suspected Gout Flare, improving  -Checked uric acid level and was 6.3 -Start prednisone 60 mg p.o. daily short-term given his erosive esophagitis as well as colchicine 0.6 daily -Given significant pain will give a 1x Dose of IV Ketorolac -Will start weaning prednisone tomorrow to 50 mg daily  DVT prophylaxis: enoxaparin (LOVENOX) injection 40 mg Start: 11/09/22 2200 SCDs Start: 11/08/22 1819    Code Status: Full Code Family Communication: Discussed with Girlfriend at bedside   Disposition Plan:  Level of care: Med-Surg Status is: Inpatient Remains inpatient appropriate because: Continues to Have Symptoms and Abdominal Discomfort and Pain   Consultants:  Gastroenterology   Procedures:  EGD Findings:      Severe esophagitis with no bleeding was found 35 to 42 cm from the       incisors.      Many superficial esophageal ulcers with no bleeding and no stigmata of       recent bleeding were found 35 to 42 cm from the incisors. The largest       lesion was 6 mm in largest dimension. Biopsies were taken with a cold       forceps for histology.      The Z-line was irregular, mucosa appeared nodular and was found 42 cm       from the incisors. Biopsies were taken with a cold forceps for histology.      Patchy erythematous mucosa without bleeding was found in the cardia, in       the gastric fundus, in the gastric body and in the gastric antrum.       Biopsies were taken with a cold forceps for Helicobacter pylori testing.      The examined duodenum was normal. Impression:               - Severe erosive esophagitis with no bleeding.                            Esophageal ulcers with no bleeding and no stigmata                             of recent bleeding. Biopsied.                           - Z-line irregular and nodular, 42 cm from the  incisors. Biopsied.                           - Erythematous mucosa in the cardia, gastric                            fundus, gastric body and antrum. Biopsied.                           - Normal examined duodenum.  Antimicrobials:  Anti-infectives (From admission, onward)    None       Subjective: Seen and Examined at bedside he is ambulating the halls but that continues to still have significant symptoms and states that he barely can tolerate anything to eat given the pain and discomfort.  And also having some abdominal discomfort.  No nausea or vomiting but still does not feel as well.  No other concerns or complaints at this time.  Objective: Vitals:   11/15/22 0500 11/15/22 0617 11/15/22 0917 11/15/22 1333  BP:  (!) 148/78 123/85 111/84  Pulse:  62  63  Resp:  18  18  Temp:  97.7 F (36.5 C)  98.1 F (36.7 C)  TempSrc:  Oral  Oral  SpO2:  100%  100%  Weight: 101.9 kg     Height:        Intake/Output Summary (Last 24 hours) at 11/15/2022 1835 Last data filed at 11/15/2022 8469 Gross per 24 hour  Intake 240 ml  Output 400 ml  Net -160 ml   Filed Weights   11/13/22 0453 11/14/22 0500 11/15/22 0500  Weight: 101.2 kg 104.4 kg 101.9 kg   Examination: Physical Exam:  Constitutional: WN/WD overweight male in NAD Respiratory: Diminished to auscultation bilaterally, no wheezing, rales, rhonchi or crackles. Normal respiratory effort and patient is not tachypenic. No accessory muscle use. Unlabored breathing   Cardiovascular: RRR, no murmurs / rubs / gallops. S1 and S2 auscultated. No extremity edema.  Abdomen: Soft, somewhat-tender, distended coronary body habitus. Bowel sounds positive.  GU: Deferred. Musculoskeletal: No clubbing / cyanosis of digits/nails. No joint deformity upper and lower extremities.  Skin: No rashes, lesions, ulcers on a  limited skin evaluation. No induration; Warm and dry.  Neurologic: CN 2-12 grossly intact with no focal deficits. Romberg sign and cerebellar reflexes not assessed.  Psychiatric: Normal judgment and insight. Alert and oriented x 3. Normal mood and appropriate affect.   Data Reviewed: I have personally reviewed following labs and imaging studies  CBC: Recent Labs  Lab 11/11/22 0440 11/12/22 0747 11/13/22 0434 11/14/22 0416 11/15/22 0549  WBC 4.7 4.6 4.3 5.1 5.6  NEUTROABS 1.7  --  1.6* 2.3 2.2  HGB 11.2* 11.4* 11.3* 10.4* 10.5*  HCT 33.0* 33.6* 33.3* 31.1* 31.6*  MCV 88.7 88.9 88.3 90.4 90.3  PLT 142* 137* 166 191 242   Basic Metabolic Panel: Recent Labs  Lab 11/11/22 0440 11/12/22 0747 11/13/22 0434 11/14/22 0416 11/15/22 0549  NA 133* 132* 134* 132* 134*  K 3.4* 3.3* 3.7 3.6 3.4*  CL 98 98 100 99 100  CO2 27 26 27 24 25   GLUCOSE 112* 114* 117* 122* 172*  BUN 9 8 10 11 12   CREATININE 0.90 0.95 0.99 1.01 0.99  CALCIUM 8.5* 8.9 8.8* 8.7* 8.8*  MG 1.7 1.7 1.8 1.7 1.7  PHOS 3.6 3.7 3.8 2.8 2.9   GFR: Estimated Creatinine Clearance: 121.8  mL/min (by C-G formula based on SCr of 0.99 mg/dL). Liver Function Tests: Recent Labs  Lab 11/10/22 0439 11/11/22 0440 11/12/22 0747 11/13/22 0434 11/14/22 0416 11/15/22 0549  AST 60*  --  69* 53* 38 39  ALT 26  --  43 40 32 33  ALKPHOS 50  --  51 48 44 46  BILITOT 1.0  --  0.9 0.6 0.5 0.4  PROT 6.7  --  7.1 6.4* 6.1* 6.0*  ALBUMIN 3.4* 3.0* 3.5 3.5 3.2* 3.1*   Recent Labs  Lab 11/10/22 1510  LIPASE 42   No results for input(s): "AMMONIA" in the last 168 hours. Coagulation Profile: No results for input(s): "INR", "PROTIME" in the last 168 hours. Cardiac Enzymes: Recent Labs  Lab 11/09/22 0121 11/10/22 0439 11/12/22 0747 11/13/22 1027  CKTOTAL 704* 744* 800* 397   BNP (last 3 results) No results for input(s): "PROBNP" in the last 8760 hours. HbA1C: No results for input(s): "HGBA1C" in the last 72  hours. CBG: No results for input(s): "GLUCAP" in the last 168 hours. Lipid Profile: No results for input(s): "CHOL", "HDL", "LDLCALC", "TRIG", "CHOLHDL", "LDLDIRECT" in the last 72 hours. Thyroid Function Tests: No results for input(s): "TSH", "T4TOTAL", "FREET4", "T3FREE", "THYROIDAB" in the last 72 hours. Anemia Panel: No results for input(s): "VITAMINB12", "FOLATE", "FERRITIN", "TIBC", "IRON", "RETICCTPCT" in the last 72 hours. Sepsis Labs: Recent Labs  Lab 11/08/22 2004 11/09/22 0121  LATICACIDVEN 1.3 1.8    No results found for this or any previous visit (from the past 240 hour(s)).   Radiology Studies: No results found.  Scheduled Meds:  amLODipine  10 mg Oral Daily   enoxaparin (LOVENOX) injection  40 mg Subcutaneous QHS   feeding supplement  237 mL Oral TID BM   influenza vac split quadrivalent PF  0.5 mL Intramuscular Tomorrow-1000   multivitamin with minerals  1 tablet Oral Daily   pantoprazole (PROTONIX) IV  40 mg Intravenous Q12H   potassium chloride  40 mEq Oral BID   predniSONE  60 mg Oral Q breakfast   sucralfate  1 g Oral Q6H   Continuous Infusions:   LOS: 7 days   Marguerita Merles, DO [facility] Available via Epic secure chat 7am-7pm After these hours, please refer to coverage provider listed on [email or web address] 11/15/2022, 6:35 PM

## 2022-11-16 ENCOUNTER — Inpatient Hospital Stay (HOSPITAL_COMMUNITY): Payer: Medicaid Other

## 2022-11-16 DIAGNOSIS — R1013 Epigastric pain: Secondary | ICD-10-CM

## 2022-11-16 LAB — CBC WITH DIFFERENTIAL/PLATELET
Abs Immature Granulocytes: 0.02 10*3/uL (ref 0.00–0.07)
Basophils Absolute: 0 10*3/uL (ref 0.0–0.1)
Basophils Relative: 0 %
Eosinophils Absolute: 0 10*3/uL (ref 0.0–0.5)
Eosinophils Relative: 0 %
HCT: 32.2 % — ABNORMAL LOW (ref 39.0–52.0)
Hemoglobin: 10.8 g/dL — ABNORMAL LOW (ref 13.0–17.0)
Immature Granulocytes: 0 %
Lymphocytes Relative: 48 %
Lymphs Abs: 3.3 10*3/uL (ref 0.7–4.0)
MCH: 30.2 pg (ref 26.0–34.0)
MCHC: 33.5 g/dL (ref 30.0–36.0)
MCV: 89.9 fL (ref 80.0–100.0)
Monocytes Absolute: 0.6 10*3/uL (ref 0.1–1.0)
Monocytes Relative: 9 %
Neutro Abs: 3 10*3/uL (ref 1.7–7.7)
Neutrophils Relative %: 43 %
Platelets: 266 10*3/uL (ref 150–400)
RBC: 3.58 MIL/uL — ABNORMAL LOW (ref 4.22–5.81)
RDW: 15.4 % (ref 11.5–15.5)
WBC: 7 10*3/uL (ref 4.0–10.5)
nRBC: 0 % (ref 0.0–0.2)

## 2022-11-16 LAB — COMPREHENSIVE METABOLIC PANEL
ALT: 37 U/L (ref 0–44)
AST: 34 U/L (ref 15–41)
Albumin: 3.4 g/dL — ABNORMAL LOW (ref 3.5–5.0)
Alkaline Phosphatase: 55 U/L (ref 38–126)
Anion gap: 9 (ref 5–15)
BUN: 16 mg/dL (ref 6–20)
CO2: 26 mmol/L (ref 22–32)
Calcium: 9.1 mg/dL (ref 8.9–10.3)
Chloride: 100 mmol/L (ref 98–111)
Creatinine, Ser: 1.04 mg/dL (ref 0.61–1.24)
GFR, Estimated: >60 mL/min (ref >60)
Glucose, Bld: 115 mg/dL — ABNORMAL HIGH (ref 70–99)
Potassium: 3.9 mmol/L (ref 3.5–5.1)
Sodium: 135 mmol/L (ref 135–145)
Total Bilirubin: 0.3 mg/dL (ref 0.3–1.2)
Total Protein: 6.9 g/dL (ref 6.5–8.1)

## 2022-11-16 LAB — VITAMIN B12: Vitamin B-12: 546 pg/mL (ref 180–914)

## 2022-11-16 LAB — PHOSPHORUS: Phosphorus: 3.1 mg/dL (ref 2.5–4.6)

## 2022-11-16 LAB — IRON AND TIBC
Iron: 60 ug/dL (ref 45–182)
Saturation Ratios: 23 % (ref 17.9–39.5)
TIBC: 256 ug/dL (ref 250–450)
UIBC: 196 ug/dL

## 2022-11-16 LAB — FOLATE: Folate: 6.5 ng/mL (ref >5.9)

## 2022-11-16 LAB — FERRITIN: Ferritin: 316 ng/mL (ref 24–336)

## 2022-11-16 LAB — MAGNESIUM: Magnesium: 1.7 mg/dL (ref 1.7–2.4)

## 2022-11-16 LAB — LIPASE, BLOOD: Lipase: 48 U/L (ref 11–51)

## 2022-11-16 MED ORDER — IOHEXOL 300 MG/ML  SOLN
100.0000 mL | Freq: Once | INTRAMUSCULAR | Status: AC | PRN
  Administered 2022-11-16: 100 mL via INTRAVENOUS

## 2022-11-16 MED ORDER — PREDNISONE 20 MG PO TABS
40.0000 mg | ORAL_TABLET | Freq: Every day | ORAL | Status: DC
  Administered 2022-11-17: 40 mg via ORAL
  Filled 2022-11-16: qty 2

## 2022-11-16 NOTE — Progress Notes (Signed)
PROGRESS NOTE    Richard Lopez  ATF:[id number] DOB: 23-Jul-1976 DOA: 11/08/2022 PCP: [name], NP   Brief Narrative:  Patient is a 47 year old overweight male with a past medical history significant for but not limited to marijuana abuse, tobacco abuse, hypertension, GERD as well as other comorbidities who presented with nausea and vomiting for 4 days as well as decreased oral intake. He is felt to be dehydrated with complaints of cramping myalgias all over. He had a few streaks of blood with vomiting episodes but none 24 hours prior to presentation. He was seen in the ED and had multiple electrolyte abnormalities and was noted to be dehydrated and was admitted for further workup. His nausea vomiting is improving but given his inability to tolerate p.o. soft diet GI was consulted and he was taken for an EGD which showed severe erosive esophagitis and esophageal ulcers noted without evidence of any active bleeding. She is now placed on PPI twice daily as well as sucralfate 1 g 4 times daily. Now he is complaining of pain in his foot likely secondary to gout flare.  Patient continues to have a very difficult time tolerating a meal and eating given recurrent pain symptoms upon eating so SLP is evaluating for what type of diet he should be on.  GI feels he can be on a soft diet but patient continues to have symptoms.  Nutritionist consulted for further evaluation and recommending Ensure Plus high-protein p.o. twice daily as well as daily multivitamin.   Biopsy results from the stomach antrum and body as well as GE junction as well as esophagus and resulted in and showed features of reactive gastropathy and acute esophagitis with ulceration with no fungal organisms.  GI spoke with the patient and they are recommending continue PPI twice daily for 2 months and sucralfate 4 times a day for 2 weeks.  Patient continues to have pain so we given his persistence we obtain a CT scan of the abdomen pelvis with  contrast and check a lipase level.  Assessment and Plan:  Nausea/vomiting likely in the setting of severe erosive esophagitis and complicated by marijuana abuse, improving Dysphagia from Above -??  Etiology and because of his esophagitis with ulcerations -Patient admitted, improving clinically. -Tolerating clear liquids and subsequently full liquids, no further nausea or vomiting. -Patient improving clinically and will be on a soft diet. -Patient advanced to soft diet however complain of significant indigestion/heartburn and epigastric pain and as such has been limiting himself to liquid diet. -Consult GI for further evaluation and management and is found to have severe erosive esophagitis with esophageal ulcers noted. -IV antiemetics, supportive care -Will be placed back on a soft diet and GI recommending continuing present medications as well as PPI twice daily for 2 months and sucralfate 1 g 4 times daily for 2 weeks and awaiting pathology results -Continues to have a very difficult time eating and feels that the food is very difficulty and burns when he goes down.  Will get SLP to further evaluate and treat to see what type of diet he and they recommending continuing D3 Diet and they are recommending the patient try supplements as needed as well as try to eat small frequent meals and they have given him written strategies to try and combat his oral discomfort; Will obtain CT Scan of Abdomen and Pelvis  -Also will obtain dietary consult to see if patient will require supplementation and they have seen the patient.   Hypokalemia Hypomagnesemia Hyponatremia  Hypophosphatemia -Likely secondary to GI losses from nausea and vomiting in the setting of dehydration. -Electrolyte Trend: Recent Labs  Lab 11/10/22 0439 11/11/22 0440 11/12/22 0747 11/13/22 0434 11/14/22 0416 11/15/22 0549 11/16/22 0702  NA 128* 133* 132* 134* 132* 134* 135  K 3.3* 3.4* 3.3* 3.7 3.6 3.4* 3.9  MG 2.2 1.7 1.7  1.8 1.7 1.7 1.7  PHOS 1.8* 3.6 3.7 3.8 2.8 2.9 3.1  -IVF now to stopped  -Continue to monitor and repeat labs in the a.m.   Hypocalcemia -Likely secondary to hypomagnesemia. -Repeat calcium level this morning was low normal at 9.1 -Magnesium at 1.7 again  -Continue monitor and replete as necessary and repeat mag level in the a.m.   GERD/Reflux the setting of severe erosive esophagitis and esophageal ulcers with no bleeding -Patient with complaints of significant throat burning/indigestion, states GI cocktail helped.   -Patient also with some epigastric pain with solid foods.  Patient and as such has just been on a liquid diet himself. -Lipase level at 42. -Continue IV PPI twice daily. -Continue Carafate. -Consulted with GI for further evaluation and management, may need upper endoscopy for further evaluation versus empiric Diflucan.  No oral thrush noted. -EGD done and showed severe esophagitis with no bleeding found 35 to 42 cm from the incisors he had many superficial esophageal ulcers with no bleeding and no stigmata of recent bleeding found.  The largest lesion was 6 mm in diameter and biopsies were taken with cold forceps for histology and the Z-line was irregular the mucosa appeared nodular and was found 42 cm from incisors.  Biopsies were taken and patient did also have erythematous mucosa without bleeding found in the cardia and gastric fundus as well as in the gastric body and gastric antrum.  The examined duodenum was normal -Biopsy results have resulted and the GI physician has spoken with the patient about the results -GI cocktail as needed. -Appreciate GI Assistance and further evaluation and they feel that he can be discharged from their standpoint but patient continues to have significant symptoms we will continue to monitor to see if he can get his symptoms little bit better before discharge -Continues to have symptoms so have added Xyloxaine; See below for further  workup  Abdominal Pain -Obtain CT Abd/Pelvis with and without Contrast -Check Lipase Level    Dehydration and Elevated CK -Hydrated with IV fluids and was at 75 mL/hr but will now stop  -CK Trend: 704 -> 744 -> 800 -> 397 -Continue to Monitor and Trend   Hypertension -Continue Amlodipine 10 mg po Daily -Continue to Monitor BP per Protocol -Last BP Reading was improved to 154/75   AKI, improved  -Likely secondary to prerenal azotemia in the setting of nausea vomiting. -Creatinine on admission was 3.17. -Urine sodium < 10. -BUN/Cr Trend: Recent Labs  Lab 11/10/22 0439 11/11/22 0440 11/12/22 0747 11/13/22 0434 11/14/22 0416 11/15/22 0549 11/16/22 0702  BUN 15 9 8 10 11 12 16   CREATININE 0.84 0.90 0.95 0.99 1.01 0.99 1.04  -Urinalysis turbid, 5 ketones, trace leukocytes, protein > 300, 6-10 WBCs, no bacteria seen. -Abdominal ultrasound negative for hydronephrosis. -Renal function improving with hydration. -IV fluids with NS at 75 mL/hr being continued, supportive care given -Avoid Nephrotoxic Medications, Contrast Dyes, Hypotension and Dehydration to Ensure Adequate Renal Perfusion and will need to Renally Adjust Meds -Continue to Monitor and Trend Renal Function carefully and repeat CMP in the AM    Abnormal LFTs/Transaminitis -Likely secondary to fatty liver in  the setting of dehydration. -Acute hepatitis panel negative. -HIV nonreactive. Recent Labs  Lab 11/08/22 1049 11/10/22 0439 11/12/22 0747 11/13/22 0434 11/14/22 0416 11/15/22 0549 11/16/22 0702  AST 50* 60* 69* 53* 38 39 34  ALT 21 26 43 40 32 33 37  -Abdominal ultrasound with hepatic fatty infiltration, cholelithiasis. -Continue to Monitor and Trend Hepatic Fxn carefully and repeat CMP in the AM     Marijuana Abuse -Marijuana cessation stressed to patient.    Hypoalbuminemia -Patient's Albumin Level went from 3.4 -> 3.0 -> 3.5 x2 -> 3.2 -> 3.1 -> 3.4 -Continue to Monitor and Trend and repeat CMP in  the AM   Normocytic Anemia -Hgb/Hct Trend: Recent Labs  Lab 11/10/22 0439 11/11/22 0440 11/12/22 0747 11/13/22 0434 11/14/22 0416 11/15/22 0549 11/16/22 0702  HGB 11.0* 11.2* 11.4* 11.3* 10.4* 10.5* 10.8*  HCT 32.7* 33.0* 33.6* 33.3* 31.1* 31.6* 32.2*  MCV 88.9 88.7 88.9 88.3 90.4 90.3 89.9  -Anemia panel was checked and showed an iron level of 60, UIBC 196, TIBC of 256, saturation ratios of 23%, ferritin level 316, folate level of 6.5, vitamin B12 546 -Continue to Monitor for S/Sx of Bleeding; No overt bleeding noted -Repeat CBC in the AM    Suspected Gout Flare, improving  -Checked uric acid level and was 6.3 -Start prednisone 60 mg p.o. daily short-term given his erosive esophagitis as well as colchicine 0.6 daily -Given significant pain will give a 1x Dose of IV Ketorolac -Will start weaning prednisone and go from 50 mg to 40 mg tomorrow  DVT prophylaxis: enoxaparin (LOVENOX) injection 40 mg Start: 11/09/22 2200 SCDs Start: 11/08/22 1819    Code Status: Full Code Family Communication: No family present at bedside  Disposition Plan:  Level of care: Med-Surg Status is: Inpatient Remains inpatient appropriate because: Because he continues to have significant abdominal discomfort and pain will obtain a CT scan of the abdomen pelvis with and without contrast for further workup   Consultants:  Gastroenterology  Procedures:  EGD Findings:      Severe esophagitis with no bleeding was found 35 to 42 cm from the       incisors.      Many superficial esophageal ulcers with no bleeding and no stigmata of       recent bleeding were found 35 to 42 cm from the incisors. The largest       lesion was 6 mm in largest dimension. Biopsies were taken with a cold       forceps for histology.      The Z-line was irregular, mucosa appeared nodular and was found 42 cm       from the incisors. Biopsies were taken with a cold forceps for histology.      Patchy erythematous mucosa without  bleeding was found in the cardia, in       the gastric fundus, in the gastric body and in the gastric antrum.       Biopsies were taken with a cold forceps for Helicobacter pylori testing.      The examined duodenum was normal. Impression:               - Severe erosive esophagitis with no bleeding.                            Esophageal ulcers with no bleeding and no stigmata  of recent bleeding. Biopsied.                           - Z-line irregular and nodular, 42 cm from the                            incisors. Biopsied.                           - Erythematous mucosa in the cardia, gastric                            fundus, gastric body and antrum. Biopsied.                           - Normal examined duodenum.  Antimicrobials:  Anti-infectives (From admission, onward)    None       Subjective: Seen and examined at bedside and he was continued to have some abdominal pain on the left side and mid epigastric area.  Will obtain a CT scan of the abdomen pelvis given that he is still not eating very much and only was able to tolerate 1 piece of bacon but could not eat the rest due to symptoms of esophageal discomfort and abdominal discomfort.  No other concerns or plaints at this time.  Objective: Vitals:   11/16/22 0241 11/16/22 0500 11/16/22 0627 11/16/22 1428  BP: 106/62  133/87 (!) 154/75  Pulse: (!) 59  73 83  Resp: 16  18 18   Temp: 98.5 F (36.9 C)  98.1 F (36.7 C) 98.2 F (36.8 C)  TempSrc: Oral  Oral Oral  SpO2: 100%  100% 100%  Weight:  100.8 kg    Height:        Intake/Output Summary (Last 24 hours) at 11/16/2022 1451 Last data filed at 11/16/2022 0800 Gross per 24 hour  Intake 600 ml  Output --  Net 600 ml   Filed Weights   11/14/22 0500 11/15/22 0500 11/16/22 0500  Weight: 104.4 kg 101.9 kg 100.8 kg   Examination: Physical Exam:  Constitutional: WN/WD overweight African-American male currently no acute distress Respiratory:  Diminished to auscultation bilaterally, no wheezing, rales, rhonchi or crackles. Normal respiratory effort and patient is not tachypenic. No accessory muscle use.  Unlabored breathing Cardiovascular: RRR, no murmurs / rubs / gallops. S1 and S2 auscultated. No extremity edema. Abdomen: Soft, slightly tender on the left side and mid epigastric area, distended secondary to body habitus bowel sounds positive.  GU: Deferred. Musculoskeletal: No clubbing / cyanosis of digits/nails. No joint deformity upper and lower extremities.  Skin: No rashes, lesions, ulcers on limited skin evaluation. No induration; Warm and dry.  Neurologic: CN 2-12 grossly intact with no focal deficits. Romberg sign and cerebellar reflexes not assessed.  Psychiatric: Normal judgment and insight. Alert and oriented x 3. Normal mood and appropriate affect.   Data Reviewed: I have personally reviewed following labs and imaging studies  CBC: Recent Labs  Lab 11/11/22 0440 11/12/22 0747 11/13/22 0434 11/14/22 0416 11/15/22 0549 11/16/22 0702  WBC 4.7 4.6 4.3 5.1 5.6 7.0  NEUTROABS 1.7  --  1.6* 2.3 2.2 3.0  HGB 11.2* 11.4* 11.3* 10.4* 10.5* 10.8*  HCT 33.0* 33.6* 33.3* 31.1* 31.6* 32.2*  MCV 88.7 88.9 88.3 90.4 90.3 89.9  PLT 142*  137* 166 191 242 266   Basic Metabolic Panel: Recent Labs  Lab 11/12/22 0747 11/13/22 0434 11/14/22 0416 11/15/22 0549 11/16/22 0702  NA 132* 134* 132* 134* 135  K 3.3* 3.7 3.6 3.4* 3.9  CL 98 100 99 100 100  CO2 26 27 24 25 26   GLUCOSE 114* 117* 122* 172* 115*  BUN 8 10 11 12 16   CREATININE 0.95 0.99 1.01 0.99 1.04  CALCIUM 8.9 8.8* 8.7* 8.8* 9.1  MG 1.7 1.8 1.7 1.7 1.7  PHOS 3.7 3.8 2.8 2.9 3.1   GFR: Estimated Creatinine Clearance: 116 mL/min (by C-G formula based on SCr of 1.04 mg/dL). Liver Function Tests: Recent Labs  Lab 11/12/22 0747 11/13/22 0434 11/14/22 0416 11/15/22 0549 11/16/22 0702  AST 69* 53* 38 39 34  ALT 43 40 32 33 37  ALKPHOS 51 48 44 46 55   BILITOT 0.9 0.6 0.5 0.4 0.3  PROT 7.1 6.4* 6.1* 6.0* 6.9  ALBUMIN 3.5 3.5 3.2* 3.1* 3.4*   Recent Labs  Lab 11/10/22 1510 11/16/22 0702  LIPASE 42 48   No results for input(s): "AMMONIA" in the last 168 hours. Coagulation Profile: No results for input(s): "INR", "PROTIME" in the last 168 hours. Cardiac Enzymes: Recent Labs  Lab 11/10/22 0439 11/12/22 0747 11/13/22 1027  CKTOTAL 744* 800* 397   BNP (last 3 results) No results for input(s): "PROBNP" in the last 8760 hours. HbA1C: No results for input(s): "HGBA1C" in the last 72 hours. CBG: No results for input(s): "GLUCAP" in the last 168 hours. Lipid Profile: No results for input(s): "CHOL", "HDL", "LDLCALC", "TRIG", "CHOLHDL", "LDLDIRECT" in the last 72 hours. Thyroid Function Tests: No results for input(s): "TSH", "T4TOTAL", "FREET4", "T3FREE", "THYROIDAB" in the last 72 hours. Anemia Panel: Recent Labs    11/15/22 1858 11/16/22 0702  VITAMINB12  --  546  FOLATE  --  6.5  FERRITIN  --  316  TIBC  --  256  IRON  --  60  RETICCTPCT 1.9  --    Sepsis Labs: No results for input(s): "PROCALCITON", "LATICACIDVEN" in the last 168 hours.  No results found for this or any previous visit (from the past 240 hour(s)).   Radiology Studies: No results found.  Scheduled Meds:  amLODipine  10 mg Oral Daily   enoxaparin (LOVENOX) injection  40 mg Subcutaneous QHS   feeding supplement  237 mL Oral TID BM   influenza vac split quadrivalent PF  0.5 mL Intramuscular Tomorrow-1000   multivitamin with minerals  1 tablet Oral Daily   pantoprazole (PROTONIX) IV  40 mg Intravenous Q12H   predniSONE  50 mg Oral Q breakfast   sucralfate  1 g Oral Q6H   Continuous Infusions:   LOS: 8 days   11/17/22, DO [facility] Available via Epic secure chat 7am-7pm After these hours, please refer to coverage provider listed on [email or web address] 11/16/2022, 2:51 PM

## 2022-11-17 ENCOUNTER — Other Ambulatory Visit (HOSPITAL_COMMUNITY): Payer: Self-pay

## 2022-11-17 DIAGNOSIS — E8729 Other acidosis: Secondary | ICD-10-CM

## 2022-11-17 LAB — COMPREHENSIVE METABOLIC PANEL
ALT: 36 U/L (ref 0–44)
AST: 34 U/L (ref 15–41)
Albumin: 3.1 g/dL — ABNORMAL LOW (ref 3.5–5.0)
Alkaline Phosphatase: 46 U/L (ref 38–126)
Anion gap: 9 (ref 5–15)
BUN: 15 mg/dL (ref 6–20)
CO2: 28 mmol/L (ref 22–32)
Calcium: 8.9 mg/dL (ref 8.9–10.3)
Chloride: 97 mmol/L — ABNORMAL LOW (ref 98–111)
Creatinine, Ser: 1.01 mg/dL (ref 0.61–1.24)
GFR, Estimated: >60 mL/min (ref >60)
Glucose, Bld: 103 mg/dL — ABNORMAL HIGH (ref 70–99)
Potassium: 3.6 mmol/L (ref 3.5–5.1)
Sodium: 134 mmol/L — ABNORMAL LOW (ref 135–145)
Total Bilirubin: 0.2 mg/dL — ABNORMAL LOW (ref 0.3–1.2)
Total Protein: 6.4 g/dL — ABNORMAL LOW (ref 6.5–8.1)

## 2022-11-17 LAB — CBC WITH DIFFERENTIAL/PLATELET
Abs Immature Granulocytes: 0.02 10*3/uL (ref 0.00–0.07)
Basophils Absolute: 0 10*3/uL (ref 0.0–0.1)
Basophils Relative: 0 %
Eosinophils Absolute: 0.1 10*3/uL (ref 0.0–0.5)
Eosinophils Relative: 1 %
HCT: 33.3 % — ABNORMAL LOW (ref 39.0–52.0)
Hemoglobin: 11.2 g/dL — ABNORMAL LOW (ref 13.0–17.0)
Immature Granulocytes: 0 %
Lymphocytes Relative: 54 %
Lymphs Abs: 4.2 10*3/uL — ABNORMAL HIGH (ref 0.7–4.0)
MCH: 29.9 pg (ref 26.0–34.0)
MCHC: 33.6 g/dL (ref 30.0–36.0)
MCV: 89 fL (ref 80.0–100.0)
Monocytes Absolute: 0.6 10*3/uL (ref 0.1–1.0)
Monocytes Relative: 7 %
Neutro Abs: 3 10*3/uL (ref 1.7–7.7)
Neutrophils Relative %: 38 %
Platelets: 283 10*3/uL (ref 150–400)
RBC: 3.74 MIL/uL — ABNORMAL LOW (ref 4.22–5.81)
RDW: 15.5 % (ref 11.5–15.5)
WBC: 7.9 10*3/uL (ref 4.0–10.5)
nRBC: 0 % (ref 0.0–0.2)

## 2022-11-17 LAB — PHOSPHORUS: Phosphorus: 3.7 mg/dL (ref 2.5–4.6)

## 2022-11-17 LAB — MAGNESIUM: Magnesium: 1.7 mg/dL (ref 1.7–2.4)

## 2022-11-17 LAB — LIPASE, BLOOD: Lipase: 49 U/L (ref 11–51)

## 2022-11-17 MED ORDER — SENNOSIDES-DOCUSATE SODIUM 8.6-50 MG PO TABS
1.0000 | ORAL_TABLET | Freq: Every day | ORAL | 0 refills | Status: DC
  Filled 2022-11-17: qty 30, 30d supply, fill #0

## 2022-11-17 MED ORDER — POLYETHYLENE GLYCOL 3350 17 G PO PACK
17.0000 g | PACK | Freq: Every day | ORAL | 0 refills | Status: DC
  Filled 2022-11-17: qty 14, 14d supply, fill #0

## 2022-11-17 MED ORDER — SENNOSIDES-DOCUSATE SODIUM 8.6-50 MG PO TABS
1.0000 | ORAL_TABLET | Freq: Two times a day (BID) | ORAL | Status: DC
  Administered 2022-11-17: 1 via ORAL
  Filled 2022-11-17: qty 1

## 2022-11-17 MED ORDER — POLYETHYLENE GLYCOL 3350 17 G PO PACK
17.0000 g | PACK | Freq: Two times a day (BID) | ORAL | Status: DC
  Administered 2022-11-17: 17 g via ORAL
  Filled 2022-11-17: qty 1

## 2022-11-17 NOTE — Discharge Summary (Signed)
Physician Discharge Summary   Patient: Richard Lopez MRN: [id number] DOB: 06-27-1976  Admit date:     11/08/2022  Discharge date: 11/17/2022  Discharge Physician: Raiford Noble, DO   PCP: Kerin Perna, NP   Recommendations at discharge:   Follow-up and establish with PCP within 1 to 2 weeks and repeat CBC, CMP, mag, Phos within 1 week Follow-up with gastroenterology within 1 to 2 weeks and continue PPI twice daily as well as Carafate for 2 weeks  Discharge Diagnoses: Principal Problem:   Intractable nausea and vomiting Active Problems:   Essential hypertension   Marijuana abuse   High anion gap metabolic acidosis   Hypokalemia   Hypomagnesemia   Hyponatremia   AKI (acute kidney injury) ([place])   Dehydration   Elevated CPK   Elevated LFTs   Hypocalcemia   Acute renal failure ([place])  Resolved Problems:   * No resolved hospital problems. [facility] Course: Patient is a 47 year old overweight male with a past medical history significant for but not limited to marijuana abuse, tobacco abuse, hypertension, GERD as well as other comorbidities who presented with nausea and vomiting for 4 days as well as decreased oral intake. He is felt to be dehydrated with complaints of cramping myalgias all over. He had a few streaks of blood with vomiting episodes but none 24 hours prior to presentation. He was seen in the ED and had multiple electrolyte abnormalities and was noted to be dehydrated and was admitted for further workup. His nausea vomiting is improving but given his inability to tolerate p.o. soft diet GI was consulted and he was taken for an EGD which showed severe erosive esophagitis and esophageal ulcers noted without evidence of any active bleeding. She is now placed on PPI twice daily as well as sucralfate 1 g 4 times daily. Now he is complaining of pain in his foot likely secondary to gout flare.  Patient continues to have a very difficult time tolerating a meal and eating given  recurrent pain symptoms upon eating so SLP is evaluating for what type of diet he should be on.  GI feels he can be on a soft diet but patient continues to have symptoms.  Nutritionist consulted for further evaluation and recommending Ensure Plus high-protein p.o. twice daily as well as daily multivitamin.   Biopsy results from the stomach antrum and body as well as GE junction as well as esophagus and resulted in and showed features of reactive gastropathy and acute esophagitis with ulceration with no fungal organisms.  GI spoke with the patient and they are recommending continue PPI twice daily for 2 months and sucralfate 4 times a day for 2 weeks.  Patient continues to have pain so we given his persistence we obtain a CT scan of the abdomen pelvis with contrast and check a lipase level.  CT scan of the abdomen pelvis was done and showed no acute abnormality and no evidence of bowel obstruction or acute bowel inflammation but there is minimal sigmoid diverticulosis without evidence of acute diverticulitis.  There is also cholelithiasis but no CT evidence of acute cholecystitis and coronary atherosclerosis of the aortic atherosclerosis.  Lipase level was normal.  His symptoms improved significantly he is being medically stable for discharge and will follow-up with respective physicians in the outpatient setting as above  Assessment and Plan:  Nausea/vomiting likely in the setting of severe erosive esophagitis and complicated by marijuana abuse, improving Dysphagia from Above, improving -??  Etiology and because of  his esophagitis with ulcerations -Patient admitted, improving clinically. -Tolerating clear liquids and subsequently full liquids, no further nausea or vomiting. -Patient improving clinically and will be on a soft diet. -Patient advanced to soft diet however complain of significant indigestion/heartburn and epigastric pain and as such has been limiting himself to liquid diet. -Consult GI  for further evaluation and management and is found to have severe erosive esophagitis with esophageal ulcers noted. -IV antiemetics, supportive care -Will be placed back on a soft diet and GI recommending continuing present medications as well as PPI twice daily for 2 months and sucralfate 1 g 4 times daily for 2 weeks and awaiting pathology results -Continues to have a very difficult time eating and feels that the food is very difficulty and burns when he goes down.  Will get SLP to further evaluate and treat to see what type of diet he and they recommending continuing D3 Diet and they are recommending the patient try supplements as needed as well as try to eat small frequent meals and they have given him written strategies to try and combat his oral discomfort; Will obtain CT Scan of Abdomen and Pelvis: See below -Also will obtain dietary consult to see if patient will require supplementation and they have seen the patient. -Patient is improved and will need close monitoring outpatient setting Hypokalemia Hypomagnesemia Hyponatremia Hypophosphatemia -Likely secondary to GI losses from nausea and vomiting in the setting of dehydration. -Electrolyte Trend: Recent Labs  Lab 11/11/22 0440 11/12/22 0747 11/13/22 0434 11/14/22 0416 11/15/22 0549 11/16/22 0702 11/17/22 0851  NA 133* 132* 134* 132* 134* 135 134*  K 3.4* 3.3* 3.7 3.6 3.4* 3.9 3.6  MG 1.7 1.7 1.8 1.7 1.7 1.7 1.7  PHOS 3.6 3.7 3.8 2.8 2.9 3.1 3.7  -IVF now to stopped  -Continue to monitor and repeat labs within 1 to 2 weeks   Hypocalcemia -Likely secondary to hypomagnesemia. -Repeat calcium level this morning was low normal at 8.9 -Magnesium at 1.7 again  -Continue monitor and replete as necessary and repeat mag level in the a.m.   GERD/Reflux the setting of severe erosive esophagitis and esophageal ulcers with no bleeding -Patient with complaints of significant throat burning/indigestion, states GI cocktail helped.    -Patient also with some epigastric pain with solid foods.  Patient and as such has just been on a liquid diet himself. -Lipase level at 42. -Continue IV PPI twice daily while hospitalized and then changed to oral PPI twice daily. -Continue Carafate. -Consulted with GI for further evaluation and management, may need upper endoscopy for further evaluation versus empiric Diflucan.  No oral thrush noted. -EGD done and showed severe esophagitis with no bleeding found 35 to 42 cm from the incisors he had many superficial esophageal ulcers with no bleeding and no stigmata of recent bleeding found.  The largest lesion was 6 mm in diameter and biopsies were taken with cold forceps for histology and the Z-line was irregular the mucosa appeared nodular and was found 42 cm from incisors.  Biopsies were taken and patient did also have erythematous mucosa without bleeding found in the cardia and gastric fundus as well as in the gastric body and gastric antrum.  The examined duodenum was normal -Biopsy results have resulted and the GI physician has spoken with the patient about the results -GI cocktail as needed. -Appreciate GI Assistance and further evaluation and they feel that he can be discharged from their standpoint but patient continues to have significant symptoms we will continue  to monitor to see if he can get his symptoms little bit better before discharge -Continues to have symptoms so have added Xyloxaine; See below for further workup and patient's symptoms have improved so he is stable for discharge and will need to follow-up with GI outpatient setting   Abdominal Pain, improved  -Obtained CT Abd/Pelvis with and without Contrast and showed "no acute abnormality and no evidence of bowel obstruction or acute bowel inflammation but there is minimal sigmoid diverticulosis without evidence of acute diverticulitis.  There is also cholelithiasis but no CT evidence of acute cholecystitis and coronary  atherosclerosis of the aortic atherosclerosis" -Check Lipase Level and was normal   Dehydration and Elevated CK -Hydrated with IV fluids and was at 75 mL/hr but will now stop  -CK Trend: 704 -> 744 -> 800 -> 397 -Continue to Monitor and Trend   Hypertension -Continue Amlodipine 10 mg po Daily -Continue to Monitor BP per Protocol -Last BP Reading was improved to 124/62   AKI, improved  -Likely secondary to prerenal azotemia in the setting of nausea vomiting. -Creatinine on admission was 3.17. -Urine sodium < 10. -BUN/Cr Trend: Recent Labs  Lab 11/11/22 0440 11/12/22 0747 11/13/22 0434 11/14/22 0416 11/15/22 0549 11/16/22 0702 11/17/22 0851  BUN 9 8 10 11 12 16 15   CREATININE 0.90 0.95 0.99 1.01 0.99 1.04 1.01  -Urinalysis turbid, 5 ketones, trace leukocytes, protein > 300, 6-10 WBCs, no bacteria seen. -Abdominal ultrasound negative for hydronephrosis. -Renal function improving with hydration. -IV fluids with NS at 75 mL/hr being continued, supportive care given -Avoid Nephrotoxic Medications, Contrast Dyes, Hypotension and Dehydration to Ensure Adequate Renal Perfusion and will need to Renally Adjust Meds -Continue to Monitor and Trend Renal Function carefully and repeat CMP within 1 week    Abnormal LFTs/Transaminitis -Likely secondary to fatty liver in the setting of dehydration. -Acute hepatitis panel negative. -HIV nonreactive. Recent Labs  Lab 11/10/22 0439 11/12/22 0747 11/13/22 0434 11/14/22 0416 11/15/22 0549 11/16/22 0702 11/17/22 0851  AST 60* 69* 53* 38 39 34 34  ALT 26 43 40 32 33 37 36  -Abdominal ultrasound with hepatic fatty infiltration, cholelithiasis. -Continue to Monitor and Trend Hepatic Fxn carefully and repeat CMP within 1 week     Marijuana Abuse -Marijuana cessation stressed to patient.    Hypoalbuminemia -Patient's Albumin Level Trend: Recent Labs  Lab 11/11/22 0440 11/12/22 0747 11/13/22 0434 11/14/22 0416 11/15/22 0549  11/16/22 0702 11/17/22 0851  ALBUMIN 3.0* 3.5 3.5 3.2* 3.1* 3.4* 3.1*  -Continue to Monitor and Trend and repeat CMP in the AM   Normocytic Anemia -Hgb/Hct Trend: Recent Labs  Lab 11/11/22 0440 11/12/22 0747 11/13/22 0434 11/14/22 0416 11/15/22 0549 11/16/22 0702 11/17/22 0851  HGB 11.2* 11.4* 11.3* 10.4* 10.5* 10.8* 11.2*  HCT 33.0* 33.6* 33.3* 31.1* 31.6* 32.2* 33.3*  MCV 88.7 88.9 88.3 90.4 90.3 89.9 89.0  -Anemia panel was checked and showed an iron level of 60, UIBC 196, TIBC of 256, saturation ratios of 23%, ferritin level 316, folate level of 6.5, vitamin B12 546 -Continue to Monitor for S/Sx of Bleeding; No overt bleeding noted -Repeat CBC in the AM   Constipation -Started bowel regimen and will continue in the outpatient setting   Suspected Gout Flare, improving  -Checked uric acid level and was 6.3 -Start prednisone 60 mg p.o. daily short-term given his erosive esophagitis as well as colchicine 0.6 daily -Given significant pain will give a 1x Dose of IV Ketorolac -Will start weaning prednisone and will  place on a Sterapred taper at discharge  Nutrition Documentation    Flowsheet Row ED to Hosp-Admission (Discharged) from 11/08/2022 in [facility]  Nutrition Problem Inadequate oral intake  Etiology acute illness  [severe erosive esophagitis and esophageal ulcers]  Nutrition Goal Patient will meet greater than or equal to 90% of their needs  Interventions Ensure Enlive (each supplement provides 350kcal and 20 grams of protein), MVI, Refer to RD note for recommendations      Consultants: Gastroenterology  Procedures performed: EGD  Disposition: Home Diet recommendation:  Discharge Diet Orders (From admission, onward)     Start     Ordered   11/17/22 0000  Diet - low sodium heart healthy        11/17/22 1200           Regular diet - SOFT Dysphagia 3  DISCHARGE MEDICATION: Allergies as of 11/17/2022   No Known Allergies       Medication List     STOP taking these medications    sucralfate 1 GM/10ML suspension Commonly known as: Carafate Replaced by: sucralfate 1 g tablet       TAKE these medications    acetaminophen 500 MG tablet Commonly known as: TYLENOL Take 500 mg by mouth every 6 (six) hours as needed for mild pain.   alum & mag hydroxide-simeth 200-200-20 MG/5ML suspension Commonly known as: MAALOX/MYLANTA Take 30 mLs by mouth every 6 (six) hours as needed for indigestion or heartburn.   amLODipine 10 MG tablet Commonly known as: NORVASC Take 1 tablet (10 mg total) by mouth daily.   cetirizine 10 MG tablet Commonly known as: ZYRTEC Take 10 mg by mouth daily as needed for allergies.   feeding supplement Liqd Take 237 mLs by mouth 3 (three) times daily between meals.   lidocaine 2 % solution Commonly known as: XYLOCAINE Use as directed 15 mLs in the mouth or throat every 6 (six) hours as needed for mouth pain.   multivitamin with minerals Tabs tablet Take 1 tablet by mouth daily.   pantoprazole 40 MG tablet Commonly known as: Protonix Take 1 tablet (40 mg total) by mouth 2 (two) times daily. What changed: when to take this   polyethylene glycol 17 g packet Commonly known as: MIRALAX / GLYCOLAX Take 17 g by mouth daily.   predniSONE 10 MG tablet Commonly known as: DELTASONE take as directed in tapering daily dose 6,5,4,3,2,1   senna-docusate 8.6-50 MG tablet Commonly known as: Senokot-S Take 1 tablet by mouth at bedtime.   sucralfate 1 g tablet Commonly known as: CARAFATE Take 1 tablet (1 g total) by mouth every 6 (six) hours for 14 days. edit per md chat Replaces: sucralfate 1 GM/10ML suspension        Discharge Exam: Filed Weights   11/15/22 0500 11/16/22 0500 11/17/22 0500  Weight: 101.9 kg 100.8 kg 103.4 kg   Vitals:   11/17/22 0621 11/17/22 0854  BP: (!) 113/59 124/62  Pulse: (!) 59   Resp: 18   Temp: 98 F (36.7 C)   SpO2: 99%     Examination: Physical Exam:  Constitutional: WN/WD overweight African-American male currently no acute distress appears calm Respiratory: Diminished to auscultation bilaterally, no wheezing, rales, rhonchi or crackles. Normal respiratory effort and patient is not tachypenic. No accessory muscle use.  Cardiovascular: RRR, no murmurs / rubs / gallops. S1 and S2 auscultated. No extremity edema. .  Abdomen: Soft, not appreciably-tender today, distended secondary body habitus.  Bowel sounds  positive.  GU: Deferred. Musculoskeletal: No clubbing / cyanosis of digits/nails. No joint deformity upper and lower extremities.  Skin: No rashes, lesions, ulcers on a limited skin evaluation. No induration; Warm and dry.  Neurologic: CN 2-12 grossly intact with no focal deficits. Romberg sign on cerebellar reflexes not assessed.  Psychiatric: Normal judgment and insight. Alert and oriented x 3. Normal mood and appropriate affect.   Condition at discharge: stable  The results of significant diagnostics from this hospitalization (including imaging, microbiology, ancillary and laboratory) are listed below for reference.   Imaging Studies: CT ABDOMEN PELVIS W WO CONTRAST  Result Date: 11/16/2022 CLINICAL DATA:  Inpatient. Generalized abdominal pain, vomiting and weakness. EXAM: CT ABDOMEN AND PELVIS WITHOUT AND WITH CONTRAST TECHNIQUE: Multidetector CT imaging of the abdomen and pelvis was performed following the standard protocol before and following the bolus administration of intravenous contrast. RADIATION DOSE REDUCTION: This exam was performed according to the departmental dose-optimization program which includes automated exposure control, adjustment of the mA and/or kV according to patient size and/or use of iterative reconstruction technique. CONTRAST:  OMNIPAQUE IOHEXOL 300 MG/ML  SOLN COMPARISON:  04/12/2019 CT abdomen/pelvis. 11/08/2022 abdominal sonogram. FINDINGS: Lower chest: No significant  pulmonary nodules or acute consolidative airspace disease. Coronary atherosclerosis. Hepatobiliary: Normal liver size. Subcentimeter hypodense right liver lesion is too small to characterize and is unchanged, presumably benign. No new liver lesions. Cholelithiasis. Nondistended gallbladder. No definite gallbladder wall thickening. No pericholecystic fluid. No biliary ductal dilatation. Pancreas: Normal, with no mass or duct dilation. Spleen: Normal size. No mass. Adrenals/Urinary Tract: Normal adrenals. No hydronephrosis. No contour deforming renal masses. Normal nondistended bladder. Stomach/Bowel: Normal non-distended stomach. Normal caliber small bowel with no small bowel wall thickening. Oral contrast transits to the cecum. Normal appendix. Minimal sigmoid diverticulosis without large bowel wall thickening or significant pericolonic fat stranding. Vascular/Lymphatic: Atherosclerotic nonaneurysmal abdominal aorta. Patent portal, splenic and renal veins. No pathologically enlarged lymph nodes in the abdomen or pelvis. Reproductive: Normal size prostate. Other: No pneumoperitoneum, ascites or focal fluid collection. Musculoskeletal: No aggressive appearing focal osseous lesions. Mild thoracolumbar spondylosis. Healed deformity in the proximal right femur status post transfixation by intact intramedullary rod with interlocking and non interlocking right femoral neck pins. IMPRESSION: 1. No acute abnormality. No evidence of bowel obstruction or acute bowel inflammation. Minimal sigmoid diverticulosis, with no evidence of acute diverticulitis. 2. Cholelithiasis. No CT evidence of acute cholecystitis. 3. Coronary atherosclerosis. 4.  Aortic Atherosclerosis (ICD10-I70.0). Electronically Signed   By: Delbert Phenix M.D.   On: 11/16/2022 20:38   US Abdomen Complete  Result Date: 11/08/2022 CLINICAL DATA:  221910 Elevated LFTs [id number] AKI (acute kidney injury) ([facility]) [id number] EXAM: ABDOMEN ULTRASOUND COMPLETE  COMPARISON:  09/19/2022 FINDINGS: Gallbladder: Numerous gallstones. No wall thickening or pericholecystic fluid Common bile duct: Diameter: 0.4cm. Liver: No focal lesion identified. Hyperechoic consistent with fatty infiltration. No intrahepatic ductal dilatation. Hepatopetal portal vein. IVC: No abnormality visualized. Pancreas: Visualized portion unremarkable. Spleen: Size and appearance within normal limits. Right Kidney: Length: 11.6cm. Echogenicity within normal limits. No mass or hydronephrosis visualized. Left Kidney: Length: 11.2cm. Echogenicity within normal limits. No mass or hydronephrosis visualized. Abdominal aorta: No aneurysm visualized. IMPRESSION: Hepatic fatty infiltration. Cholelithiasis. Electronically Signed   By: Layla Maw M.D.   On: 11/08/2022 19:21   DG Chest Portable 1 View  Result Date: 11/08/2022 CLINICAL DATA:  Chest pain EXAM: PORTABLE CHEST 1 VIEW COMPARISON:  Chest x-ray May 08, 2021 FINDINGS: The heart size and mediastinal contours are within  normal limits. Both lungs are clear. The visualized skeletal structures are unremarkable. IMPRESSION: No active disease. Electronically Signed   By: Gerome Sam III M.D.   On: 11/08/2022 10:52    Microbiology: Results for orders placed or performed during the hospital encounter of 05/08/21  SARS CORONAVIRUS 2 (TAT 6-24 HRS) Nasopharyngeal Nasopharyngeal Swab     Status: None   Collection Time: 05/08/21  8:18 AM   Specimen: Nasopharyngeal Swab  Result Value Ref Range Status   SARS Coronavirus 2 NEGATIVE NEGATIVE Final    Comment: (NOTE) SARS-CoV-2 target nucleic acids are NOT DETECTED.  The SARS-CoV-2 RNA is generally detectable in upper and lower respiratory specimens during the acute phase of infection. Negative results do not preclude SARS-CoV-2 infection, do not rule out co-infections with other pathogens, and should not be used as the sole basis for treatment or other patient management decisions. Negative  results must be combined with clinical observations, patient history, and epidemiological information. The expected result is Negative.  Fact Sheet for Patients: HairSlick.no  Fact Sheet for Healthcare Providers: quierodirigir.com  This test is not yet approved or cleared by the [place] FDA and  has been authorized for detection and/or diagnosis of SARS-CoV-2 by FDA under an Emergency Use Authorization (EUA). This EUA will remain  in effect (meaning this test can be used) for the duration of the COVID-19 declaration under Se ction 564(b)(1) of the Act, 21 U.S.C. section 360bbb-3(b)(1), unless the authorization is terminated or revoked sooner.  Performed at [facility], [address], [place], [place] [address]    Labs: CBC: Recent Labs  Lab 11/13/22 0434 11/14/22 0416 11/15/22 0549 11/16/22 0702 11/17/22 0851  WBC 4.3 5.1 5.6 7.0 7.9  NEUTROABS 1.6* 2.3 2.2 3.0 3.0  HGB 11.3* 10.4* 10.5* 10.8* 11.2*  HCT 33.3* 31.1* 31.6* 32.2* 33.3*  MCV 88.3 90.4 90.3 89.9 89.0  PLT 166 191 242 266 283   Basic Metabolic Panel: Recent Labs  Lab 11/13/22 0434 11/14/22 0416 11/15/22 0549 11/16/22 0702 11/17/22 0851  NA 134* 132* 134* 135 134*  K 3.7 3.6 3.4* 3.9 3.6  CL 100 99 100 100 97*  CO2 27 24 25 26 28   GLUCOSE 117* 122* 172* 115* 103*  BUN 10 11 12 16 15   CREATININE 0.99 1.01 0.99 1.04 1.01  CALCIUM 8.8* 8.7* 8.8* 9.1 8.9  MG 1.8 1.7 1.7 1.7 1.7  PHOS 3.8 2.8 2.9 3.1 3.7   Liver Function Tests: Recent Labs  Lab 11/13/22 0434 11/14/22 0416 11/15/22 0549 11/16/22 0702 11/17/22 0851  AST 53* 38 39 34 34  ALT 40 32 33 37 36  ALKPHOS 48 44 46 55 46  BILITOT 0.6 0.5 0.4 0.3 0.2*  PROT 6.4* 6.1* 6.0* 6.9 6.4*  ALBUMIN 3.5 3.2* 3.1* 3.4* 3.1*   CBG: No results for input(s): "GLUCAP" in the last 168 hours.  Discharge time spent: greater than 30 minutes.  Signed: 11/18/22, DO [facility] 11/17/2022

## 2022-11-17 NOTE — Progress Notes (Signed)
Richard Lopez to be D/C'd per MD order. Discussed with the patient and all questions fully answered. ? VSS, Skin clean, dry and intact without evidence of skin break down, no evidence of skin tears noted. ? IV catheter discontinued intact. Site without signs and symptoms of complications. Dressing and pressure applied. ? An After Visit Summary was printed and given to the patient. Patient informed where to pickup prescriptions. ? D/c education completed with patient/family including follow up instructions, medication list, d/c activities limitations if indicated, with other d/c instructions as indicated by MD - patient able to verbalize understanding, all questions fully answered.  ? Patient instructed to return to ED, call 911, or call MD for any changes in condition.   Patient satisfied that all belongings have been returned including cell phone and charger, backpack and clothing. ? Patient to be escorted via [place], and D/C home via private auto.

## 2022-11-18 ENCOUNTER — Telehealth (INDEPENDENT_AMBULATORY_CARE_PROVIDER_SITE_OTHER): Payer: Self-pay

## 2022-11-18 ENCOUNTER — Telehealth: Payer: Self-pay

## 2022-11-18 NOTE — Telephone Encounter (Signed)
Transition Care Management Follow-up Telephone Call Date of discharge and from where: [facility] 11/17/2022 How have you been since you were released from the hospital? better Any questions or concerns? No  Items Reviewed: Did the pt receive and understand the discharge instructions provided? Yes  Medications obtained and verified? Yes  Other? No  Any new allergies since your discharge? No  Dietary orders reviewed? Yes Do you have support at home? Yes   Home Care and Equipment/Supplies: Were home health services ordered? no If so, what is the name of the agency? no  Has the agency set up a time to come to the patient's home? not applicable Were any new equipment or medical supplies ordered?  No What is the name of the medical supply agency? N/a Were you able to get the supplies/equipment? not applicable Do you have any questions related to the use of the equipment or supplies? No  Functional Questionnaire: (I = Independent and D = Dependent) ADLs: I  Bathing/Dressing- I  Meal Prep- I  Eating- I  Maintaining continence- I  Transferring/Ambulation- I  Managing Meds- I  Follow up appointments reviewed:  PCP Hospital f/u appt confirmed? No  no avail appts. Sent message to staff to schedule Specialist Hospital f/u appt confirmed? No   Are transportation arrangements needed? No  If their condition worsens, is the pt aware to call PCP or go to the Emergency Dept.? Yes Was the patient provided with contact information for the PCP's office or ED? Yes Was to pt encouraged to call back with questions or concerns? Yes Juanda Crumble, LPN [place] Direct Dial [phone]

## 2022-11-18 NOTE — Telephone Encounter (Signed)
Transition Care Management Unsuccessful Follow-up Telephone Call  Date of discharge and from where:  11/17/2022, [facility]  Attempts:  1st Attempt  Reason for unsuccessful TCM follow-up call:  Unable to reach patient- voicemail not set up [phone].

## 2023-04-20 ENCOUNTER — Encounter (HOSPITAL_COMMUNITY): Payer: Self-pay

## 2023-04-20 ENCOUNTER — Inpatient Hospital Stay (HOSPITAL_COMMUNITY)
Admission: EM | Admit: 2023-04-20 | Discharge: 2023-04-23 | DRG: 683 | Disposition: A | Discharge status: Home / Self Care | Payer: Medicaid Other | Attending: Internal Medicine | Admitting: Internal Medicine

## 2023-04-20 ENCOUNTER — Other Ambulatory Visit: Payer: Self-pay

## 2023-04-20 ENCOUNTER — Emergency Department (HOSPITAL_COMMUNITY): Payer: Medicaid Other

## 2023-04-20 DIAGNOSIS — E871 Hypo-osmolality and hyponatremia: Secondary | ICD-10-CM | POA: Diagnosis present

## 2023-04-20 DIAGNOSIS — Z87891 Personal history of nicotine dependence: Secondary | ICD-10-CM | POA: Diagnosis not present

## 2023-04-20 DIAGNOSIS — Z7952 Long term (current) use of systemic steroids: Secondary | ICD-10-CM

## 2023-04-20 DIAGNOSIS — Z8249 Family history of ischemic heart disease and other diseases of the circulatory system: Secondary | ICD-10-CM | POA: Diagnosis not present

## 2023-04-20 DIAGNOSIS — E876 Hypokalemia: Secondary | ICD-10-CM | POA: Diagnosis present

## 2023-04-20 DIAGNOSIS — Z91199 Patient's noncompliance with other medical treatment and regimen due to unspecified reason: Secondary | ICD-10-CM | POA: Diagnosis not present

## 2023-04-20 DIAGNOSIS — M1A9XX1 Chronic gout, unspecified, with tophus (tophi): Secondary | ICD-10-CM | POA: Diagnosis present

## 2023-04-20 DIAGNOSIS — E860 Dehydration: Secondary | ICD-10-CM | POA: Diagnosis present

## 2023-04-20 DIAGNOSIS — E785 Hyperlipidemia, unspecified: Secondary | ICD-10-CM | POA: Diagnosis present

## 2023-04-20 DIAGNOSIS — Z86711 Personal history of pulmonary embolism: Secondary | ICD-10-CM

## 2023-04-20 DIAGNOSIS — N179 Acute kidney failure, unspecified: Principal | ICD-10-CM | POA: Diagnosis present

## 2023-04-20 DIAGNOSIS — Z79899 Other long term (current) drug therapy: Secondary | ICD-10-CM | POA: Diagnosis not present

## 2023-04-20 DIAGNOSIS — E8721 Acute metabolic acidosis: Secondary | ICD-10-CM | POA: Diagnosis not present

## 2023-04-20 DIAGNOSIS — F32A Depression, unspecified: Secondary | ICD-10-CM | POA: Diagnosis present

## 2023-04-20 DIAGNOSIS — E1169 Type 2 diabetes mellitus with other specified complication: Secondary | ICD-10-CM | POA: Diagnosis present

## 2023-04-20 DIAGNOSIS — Z86718 Personal history of other venous thrombosis and embolism: Secondary | ICD-10-CM | POA: Diagnosis not present

## 2023-04-20 DIAGNOSIS — I480 Paroxysmal atrial fibrillation: Secondary | ICD-10-CM | POA: Diagnosis present

## 2023-04-20 DIAGNOSIS — I10 Essential (primary) hypertension: Secondary | ICD-10-CM | POA: Diagnosis present

## 2023-04-20 DIAGNOSIS — K209 Esophagitis, unspecified without bleeding: Secondary | ICD-10-CM | POA: Diagnosis present

## 2023-04-20 DIAGNOSIS — R7989 Other specified abnormal findings of blood chemistry: Secondary | ICD-10-CM | POA: Diagnosis present

## 2023-04-20 DIAGNOSIS — F419 Anxiety disorder, unspecified: Secondary | ICD-10-CM | POA: Diagnosis present

## 2023-04-20 DIAGNOSIS — Z833 Family history of diabetes mellitus: Secondary | ICD-10-CM

## 2023-04-20 LAB — CBC
HCT: 42.3 % (ref 39.0–52.0)
Hemoglobin: 14.6 g/dL (ref 13.0–17.0)
MCH: 29.7 pg (ref 26.0–34.0)
MCHC: 34.5 g/dL (ref 30.0–36.0)
MCV: 86 fL (ref 80.0–100.0)
Platelets: 241 10*3/uL (ref 150–400)
RBC: 4.92 MIL/uL (ref 4.22–5.81)
RDW: 13.8 % (ref 11.5–15.5)
WBC: 9.5 10*3/uL (ref 4.0–10.5)
nRBC: 0 % (ref 0.0–0.2)

## 2023-04-20 LAB — HEPATIC FUNCTION PANEL
ALT: 27 U/L (ref 0–44)
AST: 59 U/L — ABNORMAL HIGH (ref 15–41)
Albumin: 5.4 g/dL — ABNORMAL HIGH (ref 3.5–5.0)
Alkaline Phosphatase: 100 U/L (ref 38–126)
Bilirubin, Direct: 0.1 mg/dL (ref 0.0–0.2)
Indirect Bilirubin: 1.3 mg/dL — ABNORMAL HIGH (ref 0.3–0.9)
Total Bilirubin: 1.4 mg/dL — ABNORMAL HIGH (ref 0.3–1.2)
Total Protein: 10.9 g/dL — ABNORMAL HIGH (ref 6.5–8.1)

## 2023-04-20 LAB — RAPID URINE DRUG SCREEN, HOSP PERFORMED
Amphetamines: NOT DETECTED
Barbiturates: NOT DETECTED
Benzodiazepines: NOT DETECTED
Cocaine: NOT DETECTED
Opiates: NOT DETECTED
Tetrahydrocannabinol: POSITIVE — AB

## 2023-04-20 LAB — BASIC METABOLIC PANEL
Anion gap: 22 — ABNORMAL HIGH (ref 5–15)
Anion gap: 16 — ABNORMAL HIGH (ref 5–15)
BUN: 25 mg/dL — ABNORMAL HIGH (ref 6–20)
BUN: 25 mg/dL — ABNORMAL HIGH (ref 6–20)
CO2: 18 mmol/L — ABNORMAL LOW (ref 22–32)
CO2: 17 mmol/L — ABNORMAL LOW (ref 22–32)
Calcium: 10.4 mg/dL — ABNORMAL HIGH (ref 8.9–10.3)
Calcium: 8.6 mg/dL — ABNORMAL LOW (ref 8.9–10.3)
Chloride: 83 mmol/L — ABNORMAL LOW (ref 98–111)
Chloride: 91 mmol/L — ABNORMAL LOW (ref 98–111)
Creatinine, Ser: 4.29 mg/dL — ABNORMAL HIGH (ref 0.61–1.24)
Creatinine, Ser: 3.27 mg/dL — ABNORMAL HIGH (ref 0.61–1.24)
GFR, Estimated: 16 mL/min — ABNORMAL LOW (ref >60)
GFR, Estimated: 23 mL/min — ABNORMAL LOW (ref >60)
Glucose, Bld: 188 mg/dL — ABNORMAL HIGH (ref 70–99)
Glucose, Bld: 231 mg/dL — ABNORMAL HIGH (ref 70–99)
Potassium: 3.2 mmol/L — ABNORMAL LOW (ref 3.5–5.1)
Potassium: 3.4 mmol/L — ABNORMAL LOW (ref 3.5–5.1)
Sodium: 123 mmol/L — ABNORMAL LOW (ref 135–145)
Sodium: 124 mmol/L — ABNORMAL LOW (ref 135–145)

## 2023-04-20 LAB — CK: Total CK: 699 U/L — ABNORMAL HIGH (ref 49–397)

## 2023-04-20 LAB — URINALYSIS, ROUTINE W REFLEX MICROSCOPIC
Bilirubin Urine: NEGATIVE
Glucose, UA: 50 mg/dL — AB
Ketones, ur: NEGATIVE mg/dL
Leukocytes,Ua: NEGATIVE
Nitrite: NEGATIVE
Protein, ur: 30 mg/dL — AB
Specific Gravity, Urine: 1.016 (ref 1.005–1.030)
pH: 5 (ref 5.0–8.0)

## 2023-04-20 LAB — GLUCOSE, CAPILLARY: Glucose-Capillary: 152 mg/dL — ABNORMAL HIGH (ref 70–99)

## 2023-04-20 MED ORDER — ENOXAPARIN SODIUM 30 MG/0.3ML IJ SOSY
30.0000 mg | PREFILLED_SYRINGE | INTRAMUSCULAR | Status: DC
  Administered 2023-04-20: 30 mg via SUBCUTANEOUS
  Filled 2023-04-20: qty 0.3

## 2023-04-20 MED ORDER — SODIUM CHLORIDE 0.9 % IV BOLUS
1000.0000 mL | Freq: Once | INTRAVENOUS | Status: AC
  Administered 2023-04-20: 1000 mL via INTRAVENOUS

## 2023-04-20 MED ORDER — PANTOPRAZOLE SODIUM 40 MG PO TBEC
40.0000 mg | DELAYED_RELEASE_TABLET | Freq: Every day | ORAL | Status: DC
  Administered 2023-04-20 – 2023-04-23 (×4): 40 mg via ORAL
  Filled 2023-04-20 (×4): qty 1

## 2023-04-20 MED ORDER — ACETAMINOPHEN 650 MG RE SUPP: 650.0000 mg | Freq: Four times a day (QID) | RECTAL | Status: DC | PRN

## 2023-04-20 MED ORDER — HYDRALAZINE HCL 20 MG/ML IJ SOLN
10.0000 mg | INTRAMUSCULAR | Status: DC | PRN
  Administered 2023-04-20: 10 mg via INTRAVENOUS
  Filled 2023-04-20: qty 1

## 2023-04-20 MED ORDER — SODIUM CHLORIDE 0.9 % IV SOLN: INTRAVENOUS | Status: DC

## 2023-04-20 MED ORDER — ONDANSETRON HCL 4 MG PO TABS
4.0000 mg | ORAL_TABLET | Freq: Four times a day (QID) | ORAL | Status: DC | PRN
  Administered 2023-04-22 (×2): 4 mg via ORAL
  Filled 2023-04-20 (×2): qty 1

## 2023-04-20 MED ORDER — AMLODIPINE BESYLATE 5 MG PO TABS
10.0000 mg | ORAL_TABLET | Freq: Every day | ORAL | Status: DC
  Administered 2023-04-20 – 2023-04-23 (×4): 10 mg via ORAL
  Filled 2023-04-20 (×4): qty 2

## 2023-04-20 MED ORDER — POTASSIUM CHLORIDE CRYS ER 20 MEQ PO TBCR
40.0000 meq | EXTENDED_RELEASE_TABLET | Freq: Once | ORAL | Status: AC
  Administered 2023-04-20: 40 meq via ORAL
  Filled 2023-04-20: qty 2

## 2023-04-20 MED ORDER — ACETAMINOPHEN 325 MG PO TABS
650.0000 mg | ORAL_TABLET | Freq: Four times a day (QID) | ORAL | Status: DC | PRN
  Administered 2023-04-21 (×2): 650 mg via ORAL
  Filled 2023-04-20 (×2): qty 2

## 2023-04-20 MED ORDER — METOCLOPRAMIDE HCL 5 MG/ML IJ SOLN
10.0000 mg | Freq: Once | INTRAMUSCULAR | Status: AC
  Administered 2023-04-20: 10 mg via INTRAVENOUS
  Filled 2023-04-20: qty 2

## 2023-04-20 MED ORDER — SODIUM CHLORIDE 0.9 % IV BOLUS
2000.0000 mL | Freq: Once | INTRAVENOUS | Status: AC
  Administered 2023-04-20: 2000 mL via INTRAVENOUS

## 2023-04-20 MED ORDER — ONDANSETRON HCL 4 MG/2ML IJ SOLN
4.0000 mg | Freq: Four times a day (QID) | INTRAMUSCULAR | Status: DC | PRN
  Administered 2023-04-20 – 2023-04-22 (×2): 4 mg via INTRAVENOUS
  Filled 2023-04-20 (×2): qty 2

## 2023-04-20 NOTE — ED Notes (Signed)
Patient transported to CT 

## 2023-04-20 NOTE — ED Notes (Signed)
Unable to give urine at this time

## 2023-04-20 NOTE — H&P (Signed)
History and Physical    Patient: Richard Lopez:[id number] DOB: Jul 27, 1976 DOA: 04/20/2023 DOS: the patient was seen and examined on 04/20/2023 PCP: [name], NP  Patient coming from: Home  Chief Complaint:  Chief Complaint  Patient presents with   Muscle Pain   HPI: ROEN TEBEAU is a 47 y.o. male with medical history significant of anxiety, osteoarthritis, depression, diet-controlled type 2 diabetes, essential hypertension, gastritis, gout, history of acute blood loss anemia, pulmonary embolism who presented to the emergency department with complaints of progressively worse bilateral cramping of his extremities for a few days.  He has no AC at his house and last week was exposed to very warm temperatures for several hours for 5 to 6 days. He has not been urinating as much.  He denied fever, chills, rhinorrhea, sore throat, wheezing or hemoptysis.  No chest pain, palpitations, diaphoresis, PND, orthopnea or pitting edema of the lower extremities.  No abdominal pain, nausea, emesis, diarrhea, constipation, melena or hematochezia.  No flank pain, dysuria, frequency or hematuria.  No polyuria, polydipsia, polyphagia or blurred vision.   Lab work: CBC was normal.  Total CK was 699.  BMP showed a sodium 123 (corrected to glucose 125), potassium 3.2, chloride 83 and CO2 18 mmol/L with an anion gap of 22.  Glucose 188, BUN 25, creatinine 4.29 and calcium 10.4 mg/dL.  Total protein is 10.9, albumin 5.4 g/dL.  AST was 59 units/L, normal ALT and alkaline phosphatase.  Total bilirubin was 1.4 mg/dL.  Imaging: CT abdomen/pelvis without contrast showing cholelithiasis, hepatic steatosis, aortic atherosclerosis, but no acute abnormality seen in the abdomen or abdomen.  ED course: Initial vital signs were temperature 98.3 F, pulse 97, respiration 18, BP 186/100 mmHg O2 sat 100% on room air.  The patient received 2000 mL of NS bolus and metoclopramide 10 mg IVP.  I added another 2000 mL of normal  saline.   Review of Systems: As mentioned in the history of present illness. All other systems reviewed and are negative.  Past Medical History:  Diagnosis Date   Anxiety    Arthritis    hands and possibly knee   Depression    Diabetes mellitus    diet controlled   Essential hypertension    Gastritis    Gout    Normocytic anemia due to blood loss 08/21/2019   Pulmonary embolism [facility])    Past Surgical History:  Procedure Laterality Date   BIOPSY  11/12/2022   Procedure: BIOPSY;  Surgeon: Kerin Salen, MD;  Location: [facility];  Service: Gastroenterology;;   ESOPHAGOGASTRODUODENOSCOPY (EGD) WITH PROPOFOL N/A 08/14/2019   Procedure: ESOPHAGOGASTRODUODENOSCOPY (EGD) WITH PROPOFOL;  Surgeon: Charlott Rakes, MD;  Location: [facility];  Service: Endoscopy;  Laterality: N/A;   ESOPHAGOGASTRODUODENOSCOPY (EGD) WITH PROPOFOL N/A 11/12/2022   Procedure: ESOPHAGOGASTRODUODENOSCOPY (EGD) WITH PROPOFOL;  Surgeon: Kerin Salen, MD;  Location: [facility];  Service: Gastroenterology;  Laterality: N/A;   EXTERNAL FIXATION LEG Left 11/07/2018   Procedure: EXTERNAL FIXATION LEFT LOWER LEG;  Surgeon: Samson Frederic, MD;  Location: [facility];  Service: Orthopedics;  Laterality: Left;   EXTERNAL FIXATION REMOVAL Left 11/08/2018   Procedure: REMOVAL EXTERNAL FIXATION LEG;  Surgeon: Roby Lofts, MD;  Location: [facility];  Service: Orthopedics;  Laterality: Left;   INTRAMEDULLARY (IM) NAIL INTERTROCHANTERIC Right 03/26/2019   Procedure: INTRAMEDULLARY (IM) NAIL INTERTROCHANTRIC;  Surgeon: Roby Lofts, MD;  Location: [facility];  Service: Orthopedics;  Laterality: Right;   INTRAMEDULLARY (IM) NAIL INTERTROCHANTERIC Right 05/17/2019  Procedure: Intramedullary (Im) Nail Intertroch with circlage wiring;  Surgeon: Myrene Galas, MD;  Location: [facility];  Service: Orthopedics;  Laterality: Right;   NO PAST SURGERIES     OPEN REDUCTION INTERNAL FIXATION (ORIF) TIBIA/FIBULA FRACTURE Left 11/08/2018   Procedure: OPEN  REDUCTION INTERNAL FIXATION (ORIF) TIBIA/FIBULA FRACTURE;  Surgeon: Roby Lofts, MD;  Location: [facility];  Service: Orthopedics;  Laterality: Left;   ORIF FEMUR FRACTURE Right 05/17/2019   Procedure: REMOVAL  OF HARDWARE;  Surgeon: Myrene Galas, MD;  Location: [facility];  Service: Orthopedics;  Laterality: Right;   Social History:  reports that he has quit smoking. His smoking use included cigarettes. He has never used smokeless tobacco. He reports current alcohol use of about 200.0 standard drinks of alcohol per week. He reports current drug use. Drug: Marijuana.  No Known Allergies  Family History  Problem Relation Age of Onset   Diabetes Mellitus II Father    Diabetes Mellitus II Other    CAD Other     Prior to Admission medications   Medication Sig Start Date End Date Taking? Authorizing Provider  acetaminophen (TYLENOL) 500 MG tablet Take 500 mg by mouth every 6 (six) hours as needed for mild pain.    [provider]  alum & mag hydroxide-simeth (MAALOX/MYLANTA) 200-200-20 MG/5ML suspension Take 30 mLs by mouth every 6 (six) hours as needed for indigestion or heartburn. 11/15/22   Marguerita Merles Latif, DO  amLODipine (NORVASC) 10 MG tablet Take 1 tablet (10 mg total) by mouth daily. 11/15/22   Marguerita Merles Latif, DO  cetirizine (ZYRTEC) 10 MG tablet Take 10 mg by mouth daily as needed for allergies.    [provider]  feeding supplement (ENSURE ENLIVE / ENSURE PLUS) LIQD Take 237 mLs by mouth 3 (three) times daily between meals. 11/15/22   Marguerita Merles Latif, DO  lidocaine (XYLOCAINE) 2 % solution Use as directed 15 mLs in the mouth or throat every 6 (six) hours as needed for mouth pain. 11/15/22   Marguerita Merles Latif, DO  Multiple Vitamin (MULTIVITAMIN WITH MINERALS) TABS tablet Take 1 tablet by mouth daily. 11/16/22   Marguerita Merles Latif, DO  pantoprazole (PROTONIX) 40 MG tablet Take 1 tablet (40 mg total) by mouth 2 (two) times daily. 11/15/22 01/14/23  Marguerita Merles Latif,  DO  polyethylene glycol (MIRALAX / GLYCOLAX) 17 g packet Take 17 g by mouth daily. 11/17/22   Marguerita Merles Latif, DO  predniSONE (DELTASONE) 10 MG tablet take as directed in tapering daily dose 6,5,4,3,2,1 11/15/22   Sheikh, Omair Latif, DO  senna-docusate (SENOKOT-S) 8.6-50 MG tablet Take 1 tablet by mouth at bedtime. 11/17/22   Marguerita Merles Latif, DO  sucralfate (CARAFATE) 1 g tablet Take 1 tablet (1 g total) by mouth every 6 (six) hours for 14 days. edit per md chat 11/15/22 11/29/22  Marguerita Merles Shelton, DO    Physical Exam: Vitals:   04/20/23 0925 04/20/23 0929 04/20/23 1135 04/20/23 1230  BP:  (!) 186/100 (!) 189/111 (!) 167/64  Pulse: 97  91 95  Resp: 18  15 (!) 23  Temp: 98.3 F (36.8 C)     TempSrc: Oral     SpO2: 100%  100% 98%   Physical Exam Constitutional:      General: He is awake. He is not in acute distress. HENT:     Head: Normocephalic and atraumatic.     Nose: No rhinorrhea.     Mouth/Throat:     Mouth: Mucous membranes are  dry.  Eyes:     General: No scleral icterus.    Pupils: Pupils are equal, round, and reactive to light.  Neck:     Vascular: No JVD.  Cardiovascular:     Rate and Rhythm: Normal rate and regular rhythm.     Heart sounds: S1 normal and S2 normal.  Pulmonary:     Effort: Pulmonary effort is normal.     Breath sounds: Normal breath sounds.  Abdominal:     General: Bowel sounds are normal. There is no distension.     Palpations: Abdomen is soft.     Tenderness: There is no abdominal tenderness. There is no right CVA tenderness, left CVA tenderness or guarding.  Musculoskeletal:     Cervical back: Neck supple.     Right lower leg: No edema.     Left lower leg: No edema.  Skin:    General: Skin is warm and dry.  Neurological:     General: No focal deficit present.     Mental Status: He is alert and oriented to person, place, and time.  Psychiatric:        Mood and Affect: Mood normal.        Behavior: Behavior normal. Behavior is  cooperative.     Data Reviewed:  Results are pending, will review when available.  Assessment and Plan: Principal Problem:   AKI (acute kidney injury) (HCC) In the setting of:   Dehydration Observation/telemetry. Continue IV fluids. Avoid hypotension. Avoid nephrotoxins. Monitor intake and output. Monitor renal function and electrolytes.  Active Problems:   Hyponatremia Secondary to perspiration. Continue normal saline infusion. Follow-up sodium level this evening. Follow-up sodium level in the morning.    Abnormal LFTs   Hypercalcemia Secondary to hemoconcentration. Recheck CMP in a.m. after hydration.    Hypokalemia K-Lor 40 mEq p.o. x 1. Follow-up potassium level in AM.    AF (paroxysmal atrial fibrillation) (HCC) CHA2DS2-VASc Score of at least 3. Currently not on anticoagulation.    Type 2 diabetes mellitus with hyperlipidemia (HCC) Carbohydrate modified diet. CBG monitoring with RI SS.    Essential hypertension Resume amlodipine 10 mg p.o. daily. Monitor blood pressure and heart rate.    Advance Care Planning:   Code Status: Full Code   Consults:   Family Communication:   Severity of Illness: The appropriate patient status for this patient is INPATIENT. Inpatient status is judged to be reasonable and necessary in order to provide the required intensity of service to ensure the patient's safety. The patient's presenting symptoms, physical exam findings, and initial radiographic and laboratory data in the context of their chronic comorbidities is felt to place them at high risk for further clinical deterioration. Furthermore, it is not anticipated that the patient will be medically stable for discharge from the hospital within 2 midnights of admission.   * I certify that at the point of admission it is my clinical judgment that the patient will require inpatient hospital care spanning beyond 2 midnights from the point of admission due to high intensity of  service, high risk for further deterioration and high frequency of surveillance required.*  Author: Bobette Mo, MD 04/20/2023 1:07 PM  For on call review [email or web address].   This document was prepared using Dragon voice recognition software and may contain some unintended transcription errors.

## 2023-04-20 NOTE — ED Notes (Signed)
ED TO INPATIENT HANDOFF REPORT  Name/Age/Gender Richard Lopez 47 y.o. male  Code Status    Code Status Orders  (From admission, onward)           Start     Ordered   04/20/23 1312  Full code  Continuous       Question:  By:  Answer:  Consent: discussion documented in EHR   04/20/23 1312           Code Status History     Date Active Date Inactive Code Status Order ID Comments User Context   11/08/2022 1430 11/17/2022 1813 Full Code [id number]  Teddy Spike, DO ED   05/08/2021 1915 05/10/2021 1738 Full Code [id number]  Teddy Spike, DO ED   04/27/2021 1649 04/29/2021 1824 Full Code [id number]  Teddy Spike, DO Inpatient   02/12/2021 1722 02/14/2021 1826 Full Code [id number]  Jae Dire, MD Inpatient   10/22/2020 1521 10/24/2020 1755 Full Code [id number]  Jae Dire, MD Inpatient   06/18/2020 2133 06/21/2020 1709 Full Code [id number]  Marinda Elk, MD ED   04/17/2020 0754 04/23/2020 2030 Full Code [id number]  Coralyn Helling, MD ED   08/13/2019 0844 08/18/2019 1709 Full Code [id number]  Rolly Salter, MD ED   08/02/2019 0007 08/05/2019 1524 Full Code [id number]  Rolly Salter, MD ED   05/17/2019 0009 05/19/2019 1953 Full Code [id number]  Charlsie Quest, MD ED   04/07/2019 1152 04/15/2019 1743 Full Code [id number]  Jonah Blue, MD ED   03/25/2019 2153 04/01/2019 1705 Full Code [id number]  Briscoe Deutscher, MD ED   11/07/2018 0738 11/10/2018 2123 Full Code [id number]  Samson Frederic, MD Inpatient   03/31/2018 1734 04/03/2018 2223 Full Code [id number]  Narda Bonds, MD ED   04/01/2017 0902 04/02/2017 2121 Full Code [id number]  Renae Fickle, MD Inpatient   07/29/2014 2034 07/31/2014 1601 Full Code [id number]  Eduard Clos, MD Inpatient       Home/SNF/Other Home  Chief Complaint AKI (acute kidney injury) (HCC) [N17.9]  Level of Care/Admitting Diagnosis ED Disposition     ED Disposition  Admit   Condition  --   Comment  Hospital Area: [facility]  [[id number]]  Level of Care: Telemetry [5]  Admit to tele based on following criteria: Other see comments  Comments: AKI, hypokalemia  May admit patient to [facility] or [facility] if equivalent level of care is available:: No  Covid Evaluation: Asymptomatic - no recent exposure (last 10 days) testing not required  Diagnosis: AKI (acute kidney injury) [facility]) [[id number]]  Admitting Physician: Bobette Mo [[id number]]  Attending Physician: Bobette Mo [phone]  Certification:: I certify this patient will need inpatient services for at least 2 midnights  Estimated Length of Stay: 2          Medical History Past Medical History:  Diagnosis Date   Anxiety    Arthritis    hands and possibly knee   Depression    Diabetes mellitus    diet controlled   Essential hypertension    Gastritis    Gout    Normocytic anemia due to blood loss 08/21/2019   Pulmonary embolism (HCC)     Allergies No Known Allergies  IV Location/Drains/Wounds Patient Lines/Drains/Airways Status     Active Line/Drains/Airways     Name Placement date Placement time Site Days   Peripheral IV 04/20/23 20 G Anterior;Distal;Right;Upper Arm 04/20/23  0947  Arm  less than  1            Labs/Imaging Results for orders placed or performed during the hospital encounter of 04/20/23 (from the past 48 hour(s))  CBC     Status: None   Collection Time: 04/20/23  9:44 AM  Result Value Ref Range   WBC 9.5 4.0 - 10.5 K/uL   RBC 4.92 4.22 - 5.81 MIL/uL   Hemoglobin 14.6 13.0 - 17.0 g/dL   HCT 16.1 09.6 - 04.5 %   MCV 86.0 80.0 - 100.0 fL   MCH 29.7 26.0 - 34.0 pg   MCHC 34.5 30.0 - 36.0 g/dL   RDW 40.9 81.1 - 91.4 %   Platelets 241 150 - 400 K/uL   nRBC 0.0 0.0 - 0.2 %    Comment: Performed at [facility], [address] [place], [place] [address]  Basic metabolic panel     Status: Abnormal   Collection Time: 04/20/23  9:44 AM  Result Value Ref Range   Sodium 123 (L) 135 - 145  mmol/L    Comment: ELECTROLYTES REPEATED TO VERIFY   Potassium 3.2 (L) 3.5 - 5.1 mmol/L    Comment: ELECTROLYTES REPEATED TO VERIFY   Chloride 83 (L) 98 - 111 mmol/L    Comment: ELECTROLYTES REPEATED TO VERIFY   CO2 18 (L) 22 - 32 mmol/L    Comment: ELECTROLYTES REPEATED TO VERIFY   Glucose, Bld 188 (H) 70 - 99 mg/dL    Comment: Glucose reference range applies only to samples taken after fasting for at least 8 hours.   BUN 25 (H) 6 - 20 mg/dL   Creatinine, Ser 6.21 (H) 0.61 - 1.24 mg/dL   Calcium 30.8 (H) 8.9 - 10.3 mg/dL    Comment: ELECTROLYTES REPEATED TO VERIFY   GFR, Estimated 16 (L) >60 mL/min    Comment: (NOTE) Calculated using the CKD-EPI Creatinine Equation (2021)    Anion gap 22 (H) 5 - 15    Comment: ELECTROLYTES REPEATED TO VERIFY Performed at [facility], [address] [place], [place] [address]   CK     Status: Abnormal   Collection Time: 04/20/23  9:44 AM  Result Value Ref Range   Total CK 699 (H) 49 - 397 U/L    Comment: Performed at [facility], [address] [place], [place] [address]  Hepatic function panel     Status: Abnormal   Collection Time: 04/20/23  9:44 AM  Result Value Ref Range   Total Protein 10.9 (H) 6.5 - 8.1 g/dL   Albumin 5.4 (H) 3.5 - 5.0 g/dL   AST 59 (H) 15 - 41 U/L   ALT 27 0 - 44 U/L   Alkaline Phosphatase 100 38 - 126 U/L   Total Bilirubin 1.4 (H) 0.3 - 1.2 mg/dL   Bilirubin, Direct 0.1 0.0 - 0.2 mg/dL   Indirect Bilirubin 1.3 (H) 0.3 - 0.9 mg/dL    Comment: Performed at [facility], [address] [place], [place] [address]   CT ABDOMEN PELVIS WO CONTRAST  Result Date: 04/20/2023 CLINICAL DATA:  Acute generalized abdominal pain. EXAM: CT ABDOMEN AND PELVIS WITHOUT CONTRAST TECHNIQUE: Multidetector CT imaging of the abdomen and pelvis was performed following the standard protocol without IV contrast. RADIATION DOSE REDUCTION: This exam was performed according to the  departmental dose-optimization program which includes automated exposure control, adjustment of the mA and/or kV according to patient size and/or use of iterative reconstruction technique. COMPARISON:  November 16, 2022. FINDINGS: Lower chest: No acute abnormality. Hepatobiliary: Hepatic steatosis. Cholelithiasis is noted without biliary dilatation. Pancreas: Unremarkable. No pancreatic ductal dilatation or surrounding inflammatory changes. Spleen: Normal in size without focal abnormality. Adrenals/Urinary Tract: Adrenal glands are unremarkable. Kidneys are normal, without renal calculi, focal lesion, or hydronephrosis. Bladder is unremarkable. Stomach/Bowel: Stomach is within normal limits. Appendix appears normal. No evidence of bowel wall thickening, distention, or inflammatory changes. Vascular/Lymphatic: Aortic atherosclerosis. No enlarged abdominal or pelvic lymph nodes. Reproductive: Prostate is unremarkable. Other: No abdominal wall hernia or abnormality. No abdominopelvic ascites. Musculoskeletal: Postsurgical changes are seen involving proximal right femur. No acute osseous abnormality is noted. IMPRESSION: Cholelithiasis. Hepatic steatosis. No acute abnormality seen in the abdomen or pelvis. Aortic Atherosclerosis (ICD10-I70.0). Electronically Signed   By: Lupita Raider M.D.   On: 04/20/2023 12:26    Pending Labs Unresulted Labs (From admission, onward)     Start     Ordered   04/21/23 0500  CBC  Tomorrow morning,   R        04/20/23 1312   04/21/23 0500  Comprehensive metabolic panel  Tomorrow morning,   R        04/20/23 1312   04/20/23 1035  Rapid urine drug screen (hospital performed)  ONCE - STAT,   STAT        04/20/23 1034   04/20/23 0929  Urinalysis, Routine w reflex microscopic -Urine, Clean Catch  Once,   URGENT       Question:  Specimen Source  Answer:  Urine, Clean Catch   04/20/23 0928            Vitals/Pain Today's Vitals   04/20/23 0929 04/20/23 1135 04/20/23 1230  04/20/23 1312  BP: (!) 186/100 (!) 189/111 (!) 167/64   Pulse:  91 95 93  Resp:  15 (!) 23 20  Temp:    98.5 F (36.9 C)  TempSrc:      SpO2:  100% 98% 100%  PainSc:        Isolation Precautions No active isolations  Medications Medications  0.9 %  sodium chloride infusion (has no administration in time range)  sodium chloride 0.9 % bolus 2,000 mL (has no administration in time range)  enoxaparin (LOVENOX) injection 30 mg (has no administration in time range)  acetaminophen (TYLENOL) tablet 650 mg (has no administration in time range)    Or  acetaminophen (TYLENOL) suppository 650 mg (has no administration in time range)  ondansetron (ZOFRAN) tablet 4 mg (has no administration in time range)    Or  ondansetron (ZOFRAN) injection 4 mg (has no administration in time range)  sodium chloride 0.9 % bolus 1,000 mL (0 mLs Intravenous Stopped 04/20/23 1148)  metoCLOPramide (REGLAN) injection 10 mg (10 mg Intravenous Given 04/20/23 1138)  sodium chloride 0.9 % bolus 1,000 mL (1,000 mLs Intravenous New Bag/Given 04/20/23 1228)    Mobility walks

## 2023-04-20 NOTE — ED Provider Notes (Signed)
[place] EMERGENCY DEPARTMENT AT [facility] Provider Note   CSN: [id number] Arrival date & time: 04/20/23  8295     History  Chief Complaint  Patient presents with   Muscle Pain    Richard Lopez is a 47 y.o. male with history of gout, hypertension, diabetes, depression, pulmonary embolism, who presents the emergency department complaining of extremity cramping. Patient states that his house has had no AC for the past 4 days, with increasing temperatures. He has been spending a lot of time outside as well, and reports significant sweating. He has been trying to hydrate, but does not think he has been doing so adequately. Did have a couple episodes of emesis. Most recently had some broth around 3 AM this morning and was able to keep that down. Complaining of stomach cramping, cramping in his bilateral hands, and bilateral calves.    Muscle Pain Associated symptoms include abdominal pain.       Home Medications Prior to Admission medications   Medication Sig Start Date End Date Taking? Authorizing Provider  acetaminophen (TYLENOL) 500 MG tablet Take 500 mg by mouth every 6 (six) hours as needed for mild pain.    [provider]  alum & mag hydroxide-simeth (MAALOX/MYLANTA) 200-200-20 MG/5ML suspension Take 30 mLs by mouth every 6 (six) hours as needed for indigestion or heartburn. 11/15/22   Marguerita Merles Latif, DO  amLODipine (NORVASC) 10 MG tablet Take 1 tablet (10 mg total) by mouth daily. 11/15/22   Marguerita Merles Latif, DO  cetirizine (ZYRTEC) 10 MG tablet Take 10 mg by mouth daily as needed for allergies.    [provider]  feeding supplement (ENSURE ENLIVE / ENSURE PLUS) LIQD Take 237 mLs by mouth 3 (three) times daily between meals. 11/15/22   Marguerita Merles Latif, DO  lidocaine (XYLOCAINE) 2 % solution Use as directed 15 mLs in the mouth or throat every 6 (six) hours as needed for mouth pain. 11/15/22   Marguerita Merles Latif, DO  Multiple Vitamin  (MULTIVITAMIN WITH MINERALS) TABS tablet Take 1 tablet by mouth daily. 11/16/22   Marguerita Merles Latif, DO  pantoprazole (PROTONIX) 40 MG tablet Take 1 tablet (40 mg total) by mouth 2 (two) times daily. 11/15/22 01/14/23  Marguerita Merles Latif, DO  polyethylene glycol (MIRALAX / GLYCOLAX) 17 g packet Take 17 g by mouth daily. 11/17/22   Marguerita Merles Latif, DO  predniSONE (DELTASONE) 10 MG tablet take as directed in tapering daily dose 6,5,4,3,2,1 11/15/22   Sheikh, Omair Latif, DO  senna-docusate (SENOKOT-S) 8.6-50 MG tablet Take 1 tablet by mouth at bedtime. 11/17/22   Marguerita Merles Latif, DO  sucralfate (CARAFATE) 1 g tablet Take 1 tablet (1 g total) by mouth every 6 (six) hours for 14 days. edit per md chat 11/15/22 11/29/22  Merlene Laughter, DO      Allergies    Patient has no known allergies.    Review of Systems   Review of Systems  Constitutional:  Positive for diaphoresis.  Gastrointestinal:  Positive for abdominal pain, nausea and vomiting.  Musculoskeletal:  Positive for myalgias.  All other systems reviewed and are negative.   Physical Exam Updated Vital Signs BP (!) 167/64   Pulse 93   Temp 98.5 F (36.9 C)   Resp 20   SpO2 100%  Physical Exam Vitals and nursing note reviewed.  Constitutional:      Appearance: Normal appearance.  HENT:     Head: Normocephalic and atraumatic.  Eyes:  Conjunctiva/sclera: Conjunctivae normal.  Cardiovascular:     Rate and Rhythm: Normal rate and regular rhythm.     Pulses:          Posterior tibial pulses are 2+ on the right side and 2+ on the left side.  Pulmonary:     Effort: Pulmonary effort is normal. No respiratory distress.     Breath sounds: Normal breath sounds.  Abdominal:     General: There is no distension.     Palpations: Abdomen is soft.     Tenderness: There is generalized abdominal tenderness. There is no guarding or rebound.  Musculoskeletal:     Comments: Compartments of all extremities are soft  Skin:     General: Skin is warm and dry.  Neurological:     General: No focal deficit present.     Mental Status: He is alert.     ED Results / Procedures / Treatments   Labs (all labs ordered are listed, but only abnormal results are displayed) Labs Reviewed  BASIC METABOLIC PANEL - Abnormal; Notable for the following components:      Result Value   Sodium 123 (*)    Potassium 3.2 (*)    Chloride 83 (*)    CO2 18 (*)    Glucose, Bld 188 (*)    BUN 25 (*)    Creatinine, Ser 4.29 (*)    Calcium 10.4 (*)    GFR, Estimated 16 (*)    Anion gap 22 (*)    All other components within normal limits  CK - Abnormal; Notable for the following components:   Total CK 699 (*)    All other components within normal limits  HEPATIC FUNCTION PANEL - Abnormal; Notable for the following components:   Total Protein 10.9 (*)    Albumin 5.4 (*)    AST 59 (*)    Total Bilirubin 1.4 (*)    Indirect Bilirubin 1.3 (*)    All other components within normal limits  CBC  URINALYSIS, ROUTINE W REFLEX MICROSCOPIC  RAPID URINE DRUG SCREEN, HOSP PERFORMED    EKG None  Radiology CT ABDOMEN PELVIS WO CONTRAST  Result Date: 04/20/2023 CLINICAL DATA:  Acute generalized abdominal pain. EXAM: CT ABDOMEN AND PELVIS WITHOUT CONTRAST TECHNIQUE: Multidetector CT imaging of the abdomen and pelvis was performed following the standard protocol without IV contrast. RADIATION DOSE REDUCTION: This exam was performed according to the departmental dose-optimization program which includes automated exposure control, adjustment of the mA and/or kV according to patient size and/or use of iterative reconstruction technique. COMPARISON:  November 16, 2022. FINDINGS: Lower chest: No acute abnormality. Hepatobiliary: Hepatic steatosis. Cholelithiasis is noted without biliary dilatation. Pancreas: Unremarkable. No pancreatic ductal dilatation or surrounding inflammatory changes. Spleen: Normal in size without focal abnormality.  Adrenals/Urinary Tract: Adrenal glands are unremarkable. Kidneys are normal, without renal calculi, focal lesion, or hydronephrosis. Bladder is unremarkable. Stomach/Bowel: Stomach is within normal limits. Appendix appears normal. No evidence of bowel wall thickening, distention, or inflammatory changes. Vascular/Lymphatic: Aortic atherosclerosis. No enlarged abdominal or pelvic lymph nodes. Reproductive: Prostate is unremarkable. Other: No abdominal wall hernia or abnormality. No abdominopelvic ascites. Musculoskeletal: Postsurgical changes are seen involving proximal right femur. No acute osseous abnormality is noted. IMPRESSION: Cholelithiasis. Hepatic steatosis. No acute abnormality seen in the abdomen or pelvis. Aortic Atherosclerosis (ICD10-I70.0). Electronically Signed   By: Lupita Raider M.D.   On: 04/20/2023 12:26    Procedures Procedures    Medications Ordered in ED Medications  0.9 %  sodium chloride infusion (has no administration in time range)  sodium chloride 0.9 % bolus 2,000 mL (has no administration in time range)  enoxaparin (LOVENOX) injection 30 mg (has no administration in time range)  acetaminophen (TYLENOL) tablet 650 mg (has no administration in time range)    Or  acetaminophen (TYLENOL) suppository 650 mg (has no administration in time range)  ondansetron (ZOFRAN) tablet 4 mg (has no administration in time range)    Or  ondansetron (ZOFRAN) injection 4 mg (has no administration in time range)  sodium chloride 0.9 % bolus 1,000 mL (0 mLs Intravenous Stopped 04/20/23 1148)  metoCLOPramide (REGLAN) injection 10 mg (10 mg Intravenous Given 04/20/23 1138)  sodium chloride 0.9 % bolus 1,000 mL (1,000 mLs Intravenous New Bag/Given 04/20/23 1228)    ED Course/ Medical Decision Making/ A&P                             Medical Decision Making Amount and/or Complexity of Data Reviewed Labs: ordered. Radiology: ordered.  Risk Prescription drug management. Decision  regarding hospitalization.   This patient is a 47 y.o. male  who presents to the ED for concern of cramping and vomiting.    Differential diagnoses prior to evaluation: The emergent differential diagnosis includes, but is not limited to,  ACS/MI, Boerhaave's, DKA, elevated ICP, Ischemic bowel, Sepsis, Drug-related (toxicity, THC hyperemesis, ETOH, withdrawal), Appendicitis, Bowel obstruction, Electrolyte abnormalities, Pancreatitis, Biliary colic, Gastroenteritis, Gastroparesis, Hepatitis, Thyroid disease, Renal colic. This is not an exhaustive differential.    Past Medical History / Co-morbidities / Social History: gout, hypertension, diabetes, depression, pulmonary embolism   Physical Exam: Physical exam performed. The pertinent findings include: Hypertensive, otherwise normal vital signs.  No acute distress.  Compartments of the extremities are soft.  Abdomen soft with some soreness, no focal tenderness or guarding.   Lab Tests/Imaging studies: I personally interpreted labs/imaging and the pertinent results include: CBC unremarkable.  Creatinine of 4.29, compared to baseline of around 1.  BUN 25, GFR 16.  Consistent with acute renal failure.  Urinalysis pending.  CK 699.  Hepatic function panel mildly elevated, nonspecific.    CT abdomen pelvis with cholelithiasis and hepatic steatosis, no other acute abdominal findings.  I agree with the radiologist interpretation.   Cardiac monitoring: EKG obtained and interpreted by my attending physician which shows: Sinus rhythm, no ischemic findings   Medications: I ordered medication including IV fluids and phenergran.  I have reviewed the patients home medicines and have made adjustments as needed.  Consultations obtained: I consulted with hospitalist Dr. Robb Matar who will admit the patient.   Disposition: After consideration of the diagnostic results and the patients response to treatment, I feel that patient would benefit from medical admission  for ongoing IV fluids and reevaluation in the setting of acute renal failure, likely dehydration due to poor p.o. intake and volume loss from heat exposure.   I discussed this case with my attending physician Dr. Jeraldine Loots who cosigned this note including patient's presenting symptoms, physical exam, and planned diagnostics and interventions. Attending physician stated agreement with plan or made changes to plan which were implemented.   Final Clinical Impression(s) / ED Diagnoses Final diagnoses:  Acute renal failure, unspecified acute renal failure type (HCC)  Dehydration    Rx / DC Orders ED Discharge Orders     None      Portions of this report may have been transcribed using voice recognition software. Every effort  was made to ensure accuracy; however, inadvertent computerized transcription errors may be present.    Jeanella Flattery 04/20/23 1326    Gerhard Munch, MD 04/20/23 1350

## 2023-04-20 NOTE — ED Triage Notes (Signed)
BIB [facility] c/o bilateral extremities cramping for few days. House has no AC he feels cramps are heat related. Admits to few episodes of emesis

## 2023-04-21 DIAGNOSIS — N179 Acute kidney failure, unspecified: Secondary | ICD-10-CM | POA: Diagnosis not present

## 2023-04-21 LAB — GLUCOSE, CAPILLARY
Glucose-Capillary: 145 mg/dL — ABNORMAL HIGH (ref 70–99)
Glucose-Capillary: 164 mg/dL — ABNORMAL HIGH (ref 70–99)
Glucose-Capillary: 153 mg/dL — ABNORMAL HIGH (ref 70–99)
Glucose-Capillary: 167 mg/dL — ABNORMAL HIGH (ref 70–99)

## 2023-04-21 LAB — COMPREHENSIVE METABOLIC PANEL
ALT: 25 U/L (ref 0–44)
AST: 62 U/L — ABNORMAL HIGH (ref 15–41)
Albumin: 3.9 g/dL (ref 3.5–5.0)
Alkaline Phosphatase: 58 U/L (ref 38–126)
Anion gap: 11 (ref 5–15)
BUN: 22 mg/dL — ABNORMAL HIGH (ref 6–20)
CO2: 19 mmol/L — ABNORMAL LOW (ref 22–32)
Calcium: 8.5 mg/dL — ABNORMAL LOW (ref 8.9–10.3)
Chloride: 97 mmol/L — ABNORMAL LOW (ref 98–111)
Creatinine, Ser: 2.24 mg/dL — ABNORMAL HIGH (ref 0.61–1.24)
GFR, Estimated: 36 mL/min — ABNORMAL LOW (ref >60)
Glucose, Bld: 139 mg/dL — ABNORMAL HIGH (ref 70–99)
Potassium: 3 mmol/L — ABNORMAL LOW (ref 3.5–5.1)
Sodium: 127 mmol/L — ABNORMAL LOW (ref 135–145)
Total Bilirubin: 0.9 mg/dL (ref 0.3–1.2)
Total Protein: 7.7 g/dL (ref 6.5–8.1)

## 2023-04-21 LAB — CBC
HCT: 33.4 % — ABNORMAL LOW (ref 39.0–52.0)
Hemoglobin: 11.6 g/dL — ABNORMAL LOW (ref 13.0–17.0)
MCH: 30.1 pg (ref 26.0–34.0)
MCHC: 34.7 g/dL (ref 30.0–36.0)
MCV: 86.5 fL (ref 80.0–100.0)
Platelets: 208 10*3/uL (ref 150–400)
RBC: 3.86 MIL/uL — ABNORMAL LOW (ref 4.22–5.81)
RDW: 13.8 % (ref 11.5–15.5)
WBC: 7.1 10*3/uL (ref 4.0–10.5)
nRBC: 0 % (ref 0.0–0.2)

## 2023-04-21 MED ORDER — POTASSIUM CHLORIDE CRYS ER 20 MEQ PO TBCR
40.0000 meq | EXTENDED_RELEASE_TABLET | ORAL | Status: AC
  Administered 2023-04-21 (×2): 40 meq via ORAL
  Filled 2023-04-21 (×2): qty 2

## 2023-04-21 MED ORDER — ENOXAPARIN SODIUM 40 MG/0.4ML IJ SOSY
40.0000 mg | PREFILLED_SYRINGE | INTRAMUSCULAR | Status: DC
  Administered 2023-04-21 – 2023-04-22 (×2): 40 mg via SUBCUTANEOUS
  Filled 2023-04-21 (×2): qty 0.4

## 2023-04-21 NOTE — Progress Notes (Signed)
PROGRESS NOTE  Richard Lopez  DOB: 28-Dec-1975  PCP: [name], NP WUJ:[id number]  DOA: 04/20/2023  LOS: 1 day  Hospital Day: 2  Brief narrative: Richard Lopez is a 47 y.o. male with PMH significant for DM2, HTN, history of PE, anxiety, depression, anemia C/17, patient presented to the ED with complaint of grossly worsening bilateral leg cramping. He has no AC at his house and last week was exposed to very warm temperatures for several hours for 5 to 6 days. He feels dehydrated, has not been urinating as much.    In the ED, patient is afebrile, blood pressure elevated 186/100, breathing on room air Labs showed normal CBC, BMP with sodium 123, potassium 3.2, bicarb 18, glucose 188, BUN/creatinine 25/4.29, total CK6 99 Urinalysis with clear yellow urine, large amount of hemoglobin, rare bacteria Urine drug screen with THC positive CT abdomen pelvis did not show any acute abnormality, showed cholelithiasis, hepatic steatosis. Patient was given IV fluid, IV Reglan Admitted to [facility]  Subjective: Patient was seen and examined this morning.  Middle-aged African-American male.  Not in distress.  Feels better than at presentation. Chart reviewed Blood pressure elevated to 150s this morning Most recent labs this morning showed WBC count 7.1, hemoglobin 11.6, sodium 127, potassium 3, BUN/creatinine improving to 22/2.24.  Assessment and plan: AKI (acute kidney injury) Acute metabolic acidosis Presented with muscle cramping, dehydration, reportedly had no AC at home Clinically dehydrated.  Labs with elevated creatinine and low bicarb below. Creatinine improving with IV fluid. Currently on normal saline at 150 mill per hour.  I will continue the same.  If bicarb fails to improve, plan to switch to bicarb drip tomorrow.  Recent Labs    11/11/22 0440 11/12/22 0747 11/13/22 0434 11/14/22 0416 11/15/22 0549 11/16/22 0702 11/17/22 0851 04/20/23 0944 04/20/23 1852 04/21/23 0451  BUN  9 8 10 11 12 16 15  25* 25* 22*  CREATININE 0.90 0.95 0.99 1.01 0.99 1.04 1.01 4.29* 3.27* 2.24*  CO2 27 26 27 24 25 26 28  18* 17* 19*   Hyponatremia Likely secondary to perspiration. Improving gradually. Continue to monitor with IV fluid Recent Labs  Lab 04/20/23 0944 04/20/23 1852 04/21/23 0451  NA 123* 124* 127*   Hypokalemia Potassium level low at 3 this morning.  Replacement ordered.  Also obtain magnesium and phosphorus level Recent Labs  Lab 04/20/23 0944 04/20/23 1852 04/21/23 0451  K 3.2* 3.4* 3.0*   Hypercalcemia Baseline calcium level normal. Calcium level was elevated to 10.4.  Likely due to dehydration.  Improved to normal with IV fluid.   Recent Labs    11/13/22 0434 11/14/22 0416 11/15/22 0549 11/16/22 0702 11/17/22 0851 04/20/23 0944 04/20/23 1852 04/21/23 0451  CALCIUM 8.8* 8.7* 8.8* 9.1 8.9 10.4* 8.6* 8.5*  PHOS 3.8 2.8 2.9 3.1 3.7  --   --   --    Essential hypertension Supposed to be hemoglobin 10 mg daily at home.  Seems non-compliant. Blood pressure was elevated 180s at presentation. Amlodipine resumed.  Continue to monitor  History of PE Currently not on anticoagulation.  Type 2 diabetes mellitus A1c 6 in Jan 2024 PTA not on meds Currently on sliding scale insulin with Accu-Cheks Recent Labs  Lab 04/20/23 2138 04/21/23 0720  GLUCAP 152* 145*   Gouty arthritis and tophus Patient seems to have significant gouty arthritis with swelling of small joints and multiple areas of tophi present.  He states he was on allopurinol before but could not continue because of  nausea.  Not on any medicines currently for gout.   Mobility: Encourage ambulation  Goals of care   Code Status: Full Code     DVT prophylaxis:  enoxaparin (LOVENOX) injection 40 mg Start: 04/21/23 2200   Antimicrobials: None Fluid: NS at 150 mill per hour Consultants: None Family Communication: None at bedside  Status: Inpatient Level of care:  Telemetry    Patient from: Home Anticipated d/c to: Hopefully home in 1 to 2 days Needs to continue in-hospital care:  Needs IV hydration. Needs renal function monitoring  Diet:  Diet Order             Diet Heart Room service appropriate? Yes; Fluid consistency: Thin  Diet effective now                   Scheduled Meds:  amLODipine  10 mg Oral Daily   enoxaparin (LOVENOX) injection  40 mg Subcutaneous Q24H   pantoprazole  40 mg Oral Daily   potassium chloride  40 mEq Oral Q2H    PRN meds: acetaminophen **OR** acetaminophen, hydrALAZINE, ondansetron **OR** ondansetron (ZOFRAN) IV   Infusions:   sodium chloride 150 mL/hr at 04/21/23 0557    Antimicrobials: Anti-infectives (From admission, onward)    None       Nutritional status:  Body mass index is 25.86 kg/m.          Objective: Vitals:   04/20/23 2135 04/21/23 0528  BP: (!) 164/97 (!) 152/91  Pulse: 91 89  Resp: 18 12  Temp: 98.5 F (36.9 C) 98.2 F (36.8 C)  SpO2: 100% 100%    Intake/Output Summary (Last 24 hours) at 04/21/2023 0922 Last data filed at 04/21/2023 0400 Gross per 24 hour  Intake 4781.43 ml  Output 1100 ml  Net 3681.43 ml   Filed Weights   04/20/23 1433  Weight: 101.5 kg   Weight change:  Body mass index is 25.86 kg/m.   Physical Exam: General exam: Pleasant, middle-aged African-American male.  Not in distress Skin: No rashes, lesions or ulcers. HEENT: Atraumatic, normocephalic, no obvious bleeding Lungs: Clear to auscultation bilaterally CVS: Regular rate and rhythm, no murmur GI/Abd soft, nontender, nondistended, bowel sound present CNS: Alert, awake, oriented x 3 Psychiatry: Mood appropriate Extremities: No pedal edema, no calf tenderness.  Multiple small joints with tophaceous swellings  Data Review: I have personally reviewed the laboratory data and studies available.  F/u labs ordered Unresulted Labs (From admission, onward)     Start     Ordered   04/22/23 0500   CBC with Differential/Platelet  Daily,   R      04/21/23 0921   04/22/23 0500  Basic metabolic panel  Daily,   R      04/21/23 0921   04/22/23 0500  Magnesium  Tomorrow morning,   R        04/21/23 0921   04/22/23 0500  Phosphorus  Tomorrow morning,   R        04/21/23 0921            Total time spent in review of labs and imaging, patient evaluation, formulation of plan, documentation and communication with family: 45 minutes  Signed, Lorin Glass, MD [facility] 04/21/2023

## 2023-04-21 NOTE — TOC Initial Note (Signed)
Transition of Care [facility]) - Initial/Assessment Note    Patient Details  Name: Richard Lopez MRN: [id number] Date of Birth: Dec 17, 1975  Transition [facility]) CM/SW Contact:    Howell Rucks, RN Phone Number: 04/21/2023, 2:41 PM  Clinical Narrative:   Met with pt at bedside to introduce role of TOC/NCM and review for dc needs. Pt reports he has PCP and pharmacy in place, no home care services, has a cane and walker that he uses as needed for functional mobility, pt reports he may need transportation assistance at discharge.  Pt had no questions/concerns. TOC will continue to follow.                 Expected Discharge Plan: Home/Self Care Barriers to Discharge: Continued Medical Work up   Patient Goals and CMS Choice Patient states their goals for this hospitalization and ongoing recovery are:: Home with family support          Expected Discharge Plan and Services       Living arrangements for the past 2 months: Single Family Home                                      Prior Living Arrangements/Services Living arrangements for the past 2 months: Single Family Home Lives with:: Spouse Patient language and need for interpreter reviewed:: Yes Do you feel safe going back to the place where you live?: Yes      Need for Family Participation in Patient Care: Yes (Comment) Care giver support system in place?: Yes (comment) Current home services: DME (walker, cane) Criminal Activity/Legal Involvement Pertinent to Current Situation/Hospitalization: No - Comment as needed  Activities of Daily Living Home Assistive Devices/Equipment: None ADL Screening (condition at time of admission) Patient's cognitive ability adequate to safely complete daily activities?: Yes Is the patient deaf or have difficulty hearing?: No Does the patient have difficulty seeing, even when wearing glasses/contacts?: No Does the patient have difficulty concentrating, remembering, or making  decisions?: No Patient able to express need for assistance with ADLs?: Yes Does the patient have difficulty dressing or bathing?: No Independently performs ADLs?: Yes (appropriate for developmental age) Does the patient have difficulty walking or climbing stairs?: No Weakness of Legs: None Weakness of Arms/Hands: None  Permission Sought/Granted Permission sought to share information with : Case Manager Permission granted to share information with : Yes, Verbal Permission Granted  Share Information with NAME: Fannie Knee, RN           Emotional Assessment Appearance:: Appears stated age Attitude/Demeanor/Rapport: Gracious Affect (typically observed): Accepting Orientation: : Oriented to Self, Oriented to Place, Oriented to  Time, Oriented to Situation Alcohol / Substance Use: Not Applicable Psych Involvement: No (comment)  Admission diagnosis:  Dehydration [E86.0] AKI (acute kidney injury) (HCC) [N17.9] Acute renal failure, unspecified acute renal failure type (HCC) [N17.9] Patient Active Problem List   Diagnosis Date Noted   Abnormal LFTs 04/20/2023   Hypercalcemia 04/20/2023   Hypocalcemia 11/09/2022   Acute renal failure (HCC) 11/09/2022   Elevated CPK 11/08/2022   Elevated LFTs 11/08/2022   Intractable nausea and vomiting 02/12/2021   AF (paroxysmal atrial fibrillation) (HCC)    Transaminitis    Dehydration    AKI (acute kidney injury) (HCC) 06/18/2020   Atrial fibrillation with RVR (HCC) 04/22/2020   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   Hyponatremia 04/21/2020   Aspiration pneumonia (HCC) 04/21/2020  Esophagitis determined by endoscopy 08/21/2019   Anemia 08/21/2019   Hypokalemia 08/17/2019   Hypomagnesemia 08/17/2019   Hemoptysis 08/17/2019   Acute upper GI bleed 08/14/2019   Hematemesis 08/13/2019   Cavitary lesion of lung 08/01/2019   Gout 05/18/2019   Peri-prosthetic fracture of femur at tip of prosthesis 05/16/2019   Alcohol use 05/16/2019    Intertrochanteric fracture of right hip (HCC) 04/14/2019   Vitamin D deficiency 04/14/2019   DTs (delirium tremens) (HCC) 04/07/2019   Delirium tremens (HCC) 04/07/2019   Encephalopathy acute    Closed right hip fracture, initial encounter (HCC) 03/25/2019   Alcohol dependence with intoxication (HCC) 03/25/2019   Hypertensive urgency 03/25/2019   Thrombocytopenia (HCC) 03/25/2019   Effusion, right knee 03/25/2019   Closed fracture of right femur (HCC)    High anion gap metabolic acidosis    Prediabetes 04/02/2017   Gastritis 04/02/2017   Marijuana abuse 04/01/2017   Nausea with vomiting 07/29/2014   Type 2 diabetes mellitus with hyperlipidemia (HCC) 07/29/2014   Essential hypertension 07/29/2014   Nausea & vomiting 07/29/2014   PCP:  [name], NP Pharmacy:   [facility] [place], [place] - [address] [place] [address] Phone: [phone] Fax: [phone]  [facility] [address] [place] - [address] [place] [address] Phone: [phone] Fax: [phone]  [facility] [address] [place] [address] Phone: [phone] Fax: [phone]     Social Determinants of Health (SDOH) Social History: SDOH Screenings   Food Insecurity: No Food Insecurity (04/20/2023)  Housing: Low Risk  (04/20/2023)  Transportation Needs: No Transportation Needs (04/20/2023)  Utilities: Not At Risk (04/20/2023)  Depression (PHQ2-9): Low Risk  (11/07/2020)  Financial Resource Strain: Medium Risk (11/07/2018)  Physical Activity: Unknown (11/07/2018)  Social Connections: Unknown (11/07/2018)  Stress: Stress Concern Present (11/07/2018)  Tobacco Use: Medium Risk (04/20/2023)   SDOH Interventions:     Readmission Risk Interventions    04/21/2023    2:37 PM  Readmission Risk Prevention Plan  Transportation Screening Complete  PCP or Specialist Appt within  3-5 Days Complete  HRI or Home Care Consult Complete  Social Work Consult for Recovery Care Planning/Counseling Complete  Palliative Care Screening Not Applicable  Medication Review Oceanographer) Complete

## 2023-04-22 DIAGNOSIS — N179 Acute kidney failure, unspecified: Secondary | ICD-10-CM | POA: Diagnosis not present

## 2023-04-22 LAB — BASIC METABOLIC PANEL
Anion gap: 10 (ref 5–15)
BUN: 11 mg/dL (ref 6–20)
CO2: 20 mmol/L — ABNORMAL LOW (ref 22–32)
Calcium: 8.3 mg/dL — ABNORMAL LOW (ref 8.9–10.3)
Chloride: 98 mmol/L (ref 98–111)
Creatinine, Ser: 1.07 mg/dL (ref 0.61–1.24)
GFR, Estimated: >60 mL/min (ref >60)
Glucose, Bld: 194 mg/dL — ABNORMAL HIGH (ref 70–99)
Potassium: 3 mmol/L — ABNORMAL LOW (ref 3.5–5.1)
Sodium: 128 mmol/L — ABNORMAL LOW (ref 135–145)

## 2023-04-22 LAB — CBC WITH DIFFERENTIAL/PLATELET
Abs Immature Granulocytes: 0.02 10*3/uL (ref 0.00–0.07)
Basophils Absolute: 0 10*3/uL (ref 0.0–0.1)
Basophils Relative: 0 %
Eosinophils Absolute: 0 10*3/uL (ref 0.0–0.5)
Eosinophils Relative: 0 %
HCT: 33.3 % — ABNORMAL LOW (ref 39.0–52.0)
Hemoglobin: 11.4 g/dL — ABNORMAL LOW (ref 13.0–17.0)
Immature Granulocytes: 0 %
Lymphocytes Relative: 34 %
Lymphs Abs: 1.6 10*3/uL (ref 0.7–4.0)
MCH: 30.2 pg (ref 26.0–34.0)
MCHC: 34.2 g/dL (ref 30.0–36.0)
MCV: 88.1 fL (ref 80.0–100.0)
Monocytes Absolute: 0.7 10*3/uL (ref 0.1–1.0)
Monocytes Relative: 15 %
Neutro Abs: 2.4 10*3/uL (ref 1.7–7.7)
Neutrophils Relative %: 51 %
Platelets: 226 10*3/uL (ref 150–400)
RBC: 3.78 MIL/uL — ABNORMAL LOW (ref 4.22–5.81)
RDW: 13.9 % (ref 11.5–15.5)
WBC: 4.8 10*3/uL (ref 4.0–10.5)
nRBC: 0 % (ref 0.0–0.2)

## 2023-04-22 LAB — PHOSPHORUS: Phosphorus: 2.3 mg/dL — ABNORMAL LOW (ref 2.5–4.6)

## 2023-04-22 LAB — GLUCOSE, CAPILLARY
Glucose-Capillary: 154 mg/dL — ABNORMAL HIGH (ref 70–99)
Glucose-Capillary: 175 mg/dL — ABNORMAL HIGH (ref 70–99)
Glucose-Capillary: 155 mg/dL — ABNORMAL HIGH (ref 70–99)
Glucose-Capillary: 162 mg/dL — ABNORMAL HIGH (ref 70–99)

## 2023-04-22 LAB — MAGNESIUM: Magnesium: 1.5 mg/dL — ABNORMAL LOW (ref 1.7–2.4)

## 2023-04-22 MED ORDER — HYDRALAZINE HCL 25 MG PO TABS
25.0000 mg | ORAL_TABLET | Freq: Three times a day (TID) | ORAL | Status: DC
  Administered 2023-04-22 – 2023-04-23 (×3): 25 mg via ORAL
  Filled 2023-04-22 (×3): qty 1

## 2023-04-22 MED ORDER — POTASSIUM CHLORIDE CRYS ER 20 MEQ PO TBCR
40.0000 meq | EXTENDED_RELEASE_TABLET | Freq: Once | ORAL | Status: AC
  Administered 2023-04-22: 40 meq via ORAL
  Filled 2023-04-22: qty 2

## 2023-04-22 MED ORDER — ALUM & MAG HYDROXIDE-SIMETH 200-200-20 MG/5ML PO SUSP
15.0000 mL | Freq: Three times a day (TID) | ORAL | Status: AC
  Administered 2023-04-22 (×3): 15 mL via ORAL
  Filled 2023-04-22 (×3): qty 30

## 2023-04-22 MED ORDER — MAGNESIUM SULFATE 2 GM/50ML IV SOLN
2.0000 g | Freq: Once | INTRAVENOUS | Status: AC
  Administered 2023-04-22: 2 g via INTRAVENOUS
  Filled 2023-04-22: qty 50

## 2023-04-22 MED ORDER — HYDRALAZINE HCL 25 MG PO TABS: 25.0000 mg | ORAL_TABLET | Freq: Three times a day (TID) | ORAL | Status: DC

## 2023-04-22 MED ORDER — K PHOS MONO-SOD PHOS DI & MONO 155-852-130 MG PO TABS
500.0000 mg | ORAL_TABLET | Freq: Once | ORAL | Status: AC
  Administered 2023-04-22: 500 mg via ORAL
  Filled 2023-04-22: qty 2

## 2023-04-22 NOTE — Progress Notes (Signed)
PROGRESS NOTE  Richard Lopez  DOB: 05-28-1976  PCP: [name], NP ZOX:[id number]  DOA: 04/20/2023  LOS: 2 days  Hospital Day: 3  Brief narrative: Richard Lopez is a 47 y.o. male with PMH significant for DM2, HTN, history of PE, anxiety, depression, anemia C/17, patient presented to the ED with complaint of grossly worsening bilateral leg cramping. He has no AC at his house and last week was exposed to very warm temperatures for several hours for 5 to 6 days. He feels dehydrated, has not been urinating as much.    In the ED, patient is afebrile, blood pressure elevated 186/100, breathing on room air Labs showed normal CBC, BMP with sodium 123, potassium 3.2, bicarb 18, glucose 188, BUN/creatinine 25/4.29, total CK6 99 Urinalysis with clear yellow urine, large amount of hemoglobin, rare bacteria Urine drug screen with THC positive CT abdomen pelvis did not show any acute abnormality, showed cholelithiasis, hepatic steatosis. Patient was given IV fluid, IV Reglan Admitted to [facility]  Subjective: Patient was seen and examined this morning.  Lying on bed.  Complains of heartburn and epigastric pain.  Has history of gastritis and on PPI.    Assessment and plan: AKI (acute kidney injury) Acute metabolic acidosis Presented with muscle cramping, dehydration, reportedly had no AC at home Clinically dehydrated.  Labs with elevated creatinine and low bicarb below. Creatinine significantly improving with IV fluid.   Recent Labs    11/12/22 0747 11/13/22 0434 11/14/22 0416 11/15/22 0549 11/16/22 0702 11/17/22 0851 04/20/23 0944 04/20/23 1852 04/21/23 0451 04/22/23 1035  BUN 8 10 11 12 16 15  25* 25* 22* 11  CREATININE 0.95 0.99 1.01 0.99 1.04 1.01 4.29* 3.27* 2.24* 1.07  CO2 26 27 24 25 26 28  18* 17* 19* 20*   Hyponatremia Likely secondary to perspiration. Improving gradually. Continue to monitor with IV fluid Recent Labs  Lab 04/20/23 0944 04/20/23 1852 04/21/23 0451  04/22/23 1035  NA 123* 124* 127* 128*   Hypokalemia/hypomagnesemia/hypophosphatemia Potassium, magnesium phosphorus levels are all low this morning.  Replacement ordered. Recent Labs  Lab 04/20/23 0944 04/20/23 1852 04/21/23 0451 04/22/23 1035  K 3.2* 3.4* 3.0* 3.0*  MG  --   --   --  1.5*  PHOS  --   --   --  2.3*   Hypercalcemia Baseline calcium level normal. Calcium level was elevated to 10.4.  Likely due to dehydration.  Improved to normal with IV fluid. Recent Labs    11/14/22 0416 11/15/22 0549 11/16/22 0702 11/17/22 0851 04/20/23 0944 04/20/23 1852 04/21/23 0451 04/22/23 1035  CALCIUM 8.7* 8.8* 9.1 8.9 10.4* 8.6* 8.5* 8.3*  PHOS 2.8 2.9 3.1 3.7  --   --   --  2.3*   Essential hypertension Supposed to be amlodipine 10 mg daily at home.  Seems non-compliant. Blood pressure was elevated 180s at presentation. Amlodipine resumed.  Blood pressure continues to remain elevated. Avoid ACE/ARB because of AKI.  I will add hydralazine 25 mg 3 times daily.  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  He was on anticoagulation for certain duration.    Epigastric pain H/o severe esophagitis Chronic anemia Patient complains of epigastric pain since last night.  Chart reviewed.  In January 2024, patient had EGD done which showed severe esophagitis, gastritis. He was placed on a course of PPI and sucralfate. Currently continued on Protonix.  Add Mylanta as needed.  No evidence of bleeding. Hemoglobin stable above 11. Continue to monitor.  Recent Labs    11/15/22 1858 11/16/22 0702 11/17/22 0851 04/20/23 0944 04/21/23 0451 04/22/23 1035  HGB  --  10.8* 11.2* 14.6 11.6* 11.4*  MCV  --  89.9 89.0 86.0 86.5 88.1  VITAMINB12  --  546  --   --   --   --   FOLATE  --  6.5  --   --   --   --   FERRITIN  --  316  --   --   --   --   TIBC  --  256  --   --   --   --   IRON  --  60  --   --   --   --   RETICCTPCT 1.9  --   --   --   --   --     Type 2 diabetes mellitus A1c 6 in Jan 2024 PTA not on meds Currently on sliding scale insulin with Accu-Cheks Recent Labs  Lab 04/21/23 0720 04/21/23 1144 04/21/23 1650 04/21/23 2122 04/22/23 0728  GLUCAP 145* 164* 153* 167* 154*   Gouty arthritis and tophus Patient seems to have significant gouty arthritis with swelling of small joints and multiple areas of tophi present.  He states he was on allopurinol before but could not continue because of nausea.  Not on any medicines currently for gout.  Mobility: Encourage ambulation  Goals of care   Code Status: Full Code     DVT prophylaxis:  enoxaparin (LOVENOX) injection 40 mg Start: 04/21/23 2200   Antimicrobials: None Fluid: NS at 150 mill per hour to continue Consultants: None Family Communication: None at bedside  Status: Inpatient Level of care:  Telemetry   Patient from: Home Anticipated d/c to: Hopefully home in 1 to 2 days Needs to continue in-hospital care:  Needs IV hydration. Needs renal function monitoring  Diet:  Diet Order             Diet Heart Room service appropriate? Yes; Fluid consistency: Thin  Diet effective now                   Scheduled Meds:  alum & mag hydroxide-simeth  15 mL Oral TID WC   amLODipine  10 mg Oral Daily   enoxaparin (LOVENOX) injection  40 mg Subcutaneous Q24H   hydrALAZINE  25 mg Oral Q8H   pantoprazole  40 mg Oral Daily    PRN meds: acetaminophen **OR** acetaminophen, hydrALAZINE, ondansetron **OR** ondansetron (ZOFRAN) IV   Infusions:   sodium chloride 150 mL/hr at 04/22/23 0533    Antimicrobials: Anti-infectives (From admission, onward)    None       Nutritional status:  Body mass index is 25.86 kg/m.          Objective: Vitals:   04/21/23 2120 04/22/23 0651  BP: (!) 158/97 (!) 166/95  Pulse: 63 63  Resp: 18 18  Temp: 98.5 F (36.9 C) 98 F (36.7 C)  SpO2: 100% 100%    Intake/Output Summary (Last 24 hours) at 04/22/2023  1206 Last data filed at 04/22/2023 0900 Gross per 24 hour  Intake 1080 ml  Output 675 ml  Net 405 ml   Filed Weights   04/20/23 1433  Weight: 101.5 kg   Weight change:  Body mass index is 25.86 kg/m.   Physical Exam: General exam: Pleasant, middle-aged African-American male.  Not in distress Skin: No rashes, lesions or ulcers. HEENT: Atraumatic, normocephalic, no obvious bleeding Lungs: Clear to  auscultation bilaterally CVS: Regular rate and rhythm, no murmur GI/Abd soft, mild epigastric tenderness, nondistended, bowel sound present CNS: Alert, awake, oriented x 3 Psychiatry: Mood appropriate Extremities: No pedal edema, no calf tenderness.  Multiple small joints with tophaceous swellings  Data Review: I have personally reviewed the laboratory data and studies available.  F/u labs ordered Unresulted Labs (From admission, onward)     Start     Ordered   04/22/23 0500  CBC with Differential/Platelet  Daily,   R      04/21/23 0921   04/22/23 0500  Basic metabolic panel  Daily,   R      04/21/23 0921            Total time spent in review of labs and imaging, patient evaluation, formulation of plan, documentation and communication with family: 45 minutes  Signed, Lorin Glass, MD [facility] 04/22/2023

## 2023-04-23 ENCOUNTER — Other Ambulatory Visit (HOSPITAL_COMMUNITY): Payer: Self-pay

## 2023-04-23 DIAGNOSIS — N179 Acute kidney failure, unspecified: Secondary | ICD-10-CM | POA: Diagnosis not present

## 2023-04-23 LAB — CBC WITH DIFFERENTIAL/PLATELET
Abs Immature Granulocytes: 0.01 10*3/uL (ref 0.00–0.07)
Basophils Absolute: 0 10*3/uL (ref 0.0–0.1)
Basophils Relative: 0 %
Eosinophils Absolute: 0.1 10*3/uL (ref 0.0–0.5)
Eosinophils Relative: 1 %
HCT: 32.9 % — ABNORMAL LOW (ref 39.0–52.0)
Hemoglobin: 11.3 g/dL — ABNORMAL LOW (ref 13.0–17.0)
Immature Granulocytes: 0 %
Lymphocytes Relative: 37 %
Lymphs Abs: 1.8 10*3/uL (ref 0.7–4.0)
MCH: 30.1 pg (ref 26.0–34.0)
MCHC: 34.3 g/dL (ref 30.0–36.0)
MCV: 87.5 fL (ref 80.0–100.0)
Monocytes Absolute: 0.7 10*3/uL (ref 0.1–1.0)
Monocytes Relative: 15 %
Neutro Abs: 2.3 10*3/uL (ref 1.7–7.7)
Neutrophils Relative %: 47 %
Platelets: 241 10*3/uL (ref 150–400)
RBC: 3.76 MIL/uL — ABNORMAL LOW (ref 4.22–5.81)
RDW: 13.8 % (ref 11.5–15.5)
WBC: 4.9 10*3/uL (ref 4.0–10.5)
nRBC: 0 % (ref 0.0–0.2)

## 2023-04-23 LAB — BASIC METABOLIC PANEL
Anion gap: 9 (ref 5–15)
BUN: 8 mg/dL (ref 6–20)
CO2: 22 mmol/L (ref 22–32)
Calcium: 8.2 mg/dL — ABNORMAL LOW (ref 8.9–10.3)
Chloride: 100 mmol/L (ref 98–111)
Creatinine, Ser: 0.82 mg/dL (ref 0.61–1.24)
GFR, Estimated: >60 mL/min (ref >60)
Glucose, Bld: 133 mg/dL — ABNORMAL HIGH (ref 70–99)
Potassium: 2.8 mmol/L — ABNORMAL LOW (ref 3.5–5.1)
Sodium: 131 mmol/L — ABNORMAL LOW (ref 135–145)

## 2023-04-23 LAB — GLUCOSE, CAPILLARY
Glucose-Capillary: 163 mg/dL — ABNORMAL HIGH (ref 70–99)
Glucose-Capillary: 110 mg/dL — ABNORMAL HIGH (ref 70–99)

## 2023-04-23 MED ORDER — AMLODIPINE BESYLATE 10 MG PO TABS
10.0000 mg | ORAL_TABLET | Freq: Every day | ORAL | 1 refills | Status: DC
  Filled 2023-04-23: qty 30, 30d supply, fill #0

## 2023-04-23 MED ORDER — POTASSIUM CHLORIDE CRYS ER 20 MEQ PO TBCR
40.0000 meq | EXTENDED_RELEASE_TABLET | Freq: Every day | ORAL | 0 refills | Status: DC
  Filled 2023-04-23: qty 3, 1d supply, fill #0

## 2023-04-23 MED ORDER — POTASSIUM CHLORIDE CRYS ER 20 MEQ PO TBCR
40.0000 meq | EXTENDED_RELEASE_TABLET | ORAL | Status: AC
  Administered 2023-04-23: 40 meq via ORAL
  Filled 2023-04-23: qty 2

## 2023-04-23 MED ORDER — PANTOPRAZOLE SODIUM 40 MG PO TBEC
40.0000 mg | DELAYED_RELEASE_TABLET | Freq: Every day | ORAL | 0 refills | Status: DC
  Filled 2023-04-23: qty 28, 28d supply, fill #0

## 2023-04-23 NOTE — Discharge Summary (Signed)
Physician Discharge Summary  Richard Lopez ZOX:[id number] DOB: 05-Jun-1976 DOA: 04/20/2023  PCP: [name], NP  Admit date: 04/20/2023 Discharge date: 04/23/2023  Admitted From: Home Discharge disposition: Home  Recommendations at discharge:  Stay hydrated Ensure compliance with Protonix, amlodipine Continue potassium chloride 40 mEq for 3 days.   Brief narrative: Richard Lopez is a 47 y.o. male with PMH significant for DM2, HTN, history of PE, anxiety, depression, anemia C/17, patient presented to the ED with complaint of grossly worsening bilateral leg cramping. He has no AC at his house and last week was exposed to very warm temperatures for several hours for 5 to 6 days. He feels dehydrated, has not been urinating as much.    In the ED, patient is afebrile, blood pressure elevated 186/100, breathing on room air Labs showed normal CBC, BMP with sodium 123, potassium 3.2, bicarb 18, glucose 188, BUN/creatinine 25/4.29, total CK6 99 Urinalysis with clear yellow urine, large amount of hemoglobin, rare bacteria Urine drug screen with THC positive CT abdomen pelvis did not show any acute abnormality, showed cholelithiasis, hepatic steatosis. Patient was given IV fluid, IV Reglan Admitted to [facility]  Subjective: Patient was seen and examined this morning.  Probably bed.  Not in distress.  Abdominal pain has improved.  Adequately hydrated.  Feels ready for discharge today.  Hospital course: AKI (acute kidney injury) Acute metabolic acidosis Presented with muscle cramping, dehydration, reportedly had no AC at home.  He looks dehydrated. Clinically dehydrated.  Labs with elevated creatinine and low bicarb below. Creatinine significantly improving with IV fluid.  Recent Labs    11/13/22 0434 11/14/22 0416 11/15/22 0549 11/16/22 0702 11/17/22 0851 04/20/23 0944 04/20/23 1852 04/21/23 0451 04/22/23 1035 04/23/23 0439  BUN 10 11 12 16 15  25* 25* 22* 11 8  CREATININE 0.99  1.01 0.99 1.04 1.01 4.29* 3.27* 2.24* 1.07 0.82  CO2 27 24 25 26 28  18* 17* 19* 20* 22   Hyponatremia Likely secondary to perspiration. Improving steadily with IV hydration. Recent Labs  Lab 04/20/23 0944 04/20/23 1852 04/21/23 0451 04/22/23 1035 04/23/23 0439  NA 123* 124* 127* 128* 131*   Hypokalemia/hypomagnesemia/hypophosphatemia Potassium, magnesium phosphorus levels were all low yesterday.  Replaced.  Potassium level is still low this morning.  Further replacement given. Discharge with 3 more days of oral potassium. Recent Labs  Lab 04/20/23 0944 04/20/23 1852 04/21/23 0451 04/22/23 1035 04/23/23 0439  K 3.2* 3.4* 3.0* 3.0* 2.8*  MG  --   --   --  1.5*  --   PHOS  --   --   --  2.3*  --    Hypercalcemia Baseline calcium level normal. Calcium level was elevated to 10.4.  Likely due to dehydration.  Improved to normal with IV fluid. Recent Labs    11/14/22 0416 11/15/22 0549 11/16/22 0702 11/17/22 0851 04/20/23 0944 04/20/23 1852 04/21/23 0451 04/22/23 1035 04/23/23 0439  CALCIUM 8.7* 8.8* 9.1 8.9 10.4* 8.6* 8.5* 8.3* 8.2*  PHOS 2.8 2.9 3.1 3.7  --   --   --  2.3*  --    Essential hypertension Supposed to be amlodipine 10 mg daily at home.  Seems non-compliant. Blood pressure was elevated 180s at presentation. Amlodipine 10 mg daily resumed.  Gradually improving pressure.  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  He was on anticoagulation for certain duration.    Epigastric pain H/o severe esophagitis Chronic anemia Patient complains of epigastric pain since last night.  Chart reviewed.  In January 2024, patient had EGD done which showed severe esophagitis, gastritis. He was placed on a course of PPI and sucralfate.  Patient states he completed the course. But he remains symptomatic.  I would start him on oral course of Protonix.  Also continue Mylanta as needed. Hemoglobin stable above 11. Continue to monitor. Recent  Labs    11/15/22 1858 11/16/22 0702 11/17/22 0851 04/20/23 0944 04/21/23 0451 04/22/23 1035 04/23/23 0439  HGB  --  10.8* 11.2* 14.6 11.6* 11.4* 11.3*  MCV  --  89.9 89.0 86.0 86.5 88.1 87.5  VITAMINB12  --  546  --   --   --   --   --   FOLATE  --  6.5  --   --   --   --   --   FERRITIN  --  316  --   --   --   --   --   TIBC  --  256  --   --   --   --   --   IRON  --  60  --   --   --   --   --   RETICCTPCT 1.9  --   --   --   --   --   --    Type 2 diabetes mellitus A1c 6 in Jan 2024 PTA not on meds Currently on sliding scale insulin with Accu-Cheks Recent Labs  Lab 04/22/23 1235 04/22/23 1642 04/22/23 2217 04/23/23 0739 04/23/23 1136  GLUCAP 175* 155* 162* 163* 110*   Gouty arthritis and tophus Patient seems to have significant gouty arthritis with swelling of small joints and multiple areas of tophi present.  He states he was on allopurinol before but could not continue because of nausea.  Not on any medicines currently for gout.  Mobility: Encourage ambulation  Goals of care   Code Status: Full Code   Wounds:  -    Discharge Exam:   Vitals:   04/22/23 1406 04/22/23 2214 04/23/23 0611 04/23/23 1223  BP: (!) 137/90 (!) 153/95 135/85 (!) 153/96  Pulse: 61 65 62 (!) 59  Resp: 18 19 18 17   Temp: 97.6 F (36.4 C) 98.2 F (36.8 C) 98.4 F (36.9 C) 97.6 F (36.4 C)  TempSrc: Oral Oral Oral Oral  SpO2: 100% 100% 100% 100%  Weight:      Height:        Body mass index is 25.86 kg/m.   General exam: Pleasant, middle-aged African-American male.  Not in distress Skin: No rashes, lesions or ulcers. HEENT: Atraumatic, normocephalic, no obvious bleeding Lungs: Clear to auscultation bilaterally CVS: Regular rate and rhythm, no murmur GI/Abd soft, mild epigastric tenderness, nondistended, bowel sound present CNS: Alert, awake, oriented x 3 Psychiatry: Mood appropriate Extremities: No pedal edema, no calf tenderness.  Multiple small joints with tophaceous  swellings  Follow ups:    Follow-up Information     [name], NP Follow up.   Specialty: Internal Medicine Contact information: [address] [place] [address] [phone]                 Discharge Instructions:   Discharge Instructions     Call MD for:  difficulty breathing, headache or visual disturbances   Complete by: As directed    Call MD for:  extreme fatigue   Complete by: As directed    Call MD for:  hives   Complete by: As directed  Call MD for:  persistant dizziness or light-headedness   Complete by: As directed    Call MD for:  persistant nausea and vomiting   Complete by: As directed    Call MD for:  severe uncontrolled pain   Complete by: As directed    Call MD for:  temperature >100.4   Complete by: As directed    Diet general   Complete by: As directed    Discharge instructions   Complete by: As directed    Recommendations at discharge:   Stay hydrated  Ensure compliance with Protonix, amlodipine  Continue potassium chloride 40 mEq for 3 days.  General discharge instructions: Follow with Primary MD [name], NP in 7 days  Please request your PCP  to go over your hospital tests, procedures, radiology results at the follow up. Please get your medicines reviewed and adjusted.  Your PCP may decide to repeat certain labs or tests as needed. Do not drive, operate heavy machinery, perform activities at heights, swimming or participation in water activities or provide baby sitting services if your were admitted for syncope or siezures until you have seen by Primary MD or a Neurologist and advised to do so again. [place] Controlled Substance Reporting System database was reviewed. Do not drive, operate heavy machinery, perform activities at heights, swim, participate in water activities or provide baby-sitting services while on medications for pain, sleep and mood until your outpatient physician has reevaluated  you and advised to do so again.  You are strongly recommended to comply with the dose, frequency and duration of prescribed medications. Activity: As tolerated with Full fall precautions use walker/cane & assistance as needed Avoid using any recreational substances like cigarette, tobacco, alcohol, or non-prescribed drug. If you experience worsening of your admission symptoms, develop shortness of breath, life threatening emergency, suicidal or homicidal thoughts you must seek medical attention immediately by calling 911 or calling your MD immediately  if symptoms less severe. You must read complete instructions/literature along with all the possible adverse reactions/side effects for all the medicines you take and that have been prescribed to you. Take any new medicine only after you have completely understood and accepted all the possible adverse reactions/side effects.  Wear Seat belts while driving. You were cared for by a hospitalist during your hospital stay. If you have any questions about your discharge medications or the care you received while you were in the hospital after you are discharged, you can call the unit and ask to speak with the hospitalist or the covering physician. Once you are discharged, your primary care physician will handle any further medical issues. Please note that NO REFILLS for any discharge medications will be authorized once you are discharged, as it is imperative that you return to your primary care physician (or establish a relationship with a primary care physician if you do not have one).   Increase activity slowly   Complete by: As directed        Discharge Medications:   Allergies as of 04/23/2023   No Known Allergies      Medication List     STOP taking these medications    omeprazole 20 MG capsule Commonly known as: PRILOSEC   polyethylene glycol 17 g packet Commonly known as: MIRALAX / GLYCOLAX   predniSONE 10 MG tablet Commonly known as:  DELTASONE   senna-docusate 8.6-50 MG tablet Commonly known as: Senokot-S   sucralfate 1 g tablet Commonly known as: CARAFATE  TAKE these medications    acetaminophen 500 MG tablet Commonly known as: TYLENOL Take 1,000 mg by mouth daily as needed for mild pain.   amLODipine 10 MG tablet Commonly known as: NORVASC Take 1 tablet (10 mg total) by mouth daily.   feeding supplement Liqd Take 237 mLs by mouth 3 (three) times daily between meals. What changed: when to take this   multivitamin with minerals Tabs tablet Take 1 tablet by mouth daily.   pantoprazole 40 MG tablet Commonly known as: Protonix Take 1 tablet (40 mg total) by mouth daily for 28 days. What changed: when to take this   potassium chloride SA 20 MEQ tablet Commonly known as: KLOR-CON M Take 2 tablets (40 mEq total) by mouth daily for 3 days.   Voltaren 1 % Gel Generic drug: diclofenac Sodium Apply 1 Application topically as needed (pain).         The results of significant diagnostics from this hospitalization (including imaging, microbiology, ancillary and laboratory) are listed below for reference.    Procedures and Diagnostic Studies:   CT ABDOMEN PELVIS WO CONTRAST  Result Date: 04/20/2023 CLINICAL DATA:  Acute generalized abdominal pain. EXAM: CT ABDOMEN AND PELVIS WITHOUT CONTRAST TECHNIQUE: Multidetector CT imaging of the abdomen and pelvis was performed following the standard protocol without IV contrast. RADIATION DOSE REDUCTION: This exam was performed according to the departmental dose-optimization program which includes automated exposure control, adjustment of the mA and/or kV according to patient size and/or use of iterative reconstruction technique. COMPARISON:  November 16, 2022. FINDINGS: Lower chest: No acute abnormality. Hepatobiliary: Hepatic steatosis. Cholelithiasis is noted without biliary dilatation. Pancreas: Unremarkable. No pancreatic ductal dilatation or surrounding  inflammatory changes. Spleen: Normal in size without focal abnormality. Adrenals/Urinary Tract: Adrenal glands are unremarkable. Kidneys are normal, without renal calculi, focal lesion, or hydronephrosis. Bladder is unremarkable. Stomach/Bowel: Stomach is within normal limits. Appendix appears normal. No evidence of bowel wall thickening, distention, or inflammatory changes. Vascular/Lymphatic: Aortic atherosclerosis. No enlarged abdominal or pelvic lymph nodes. Reproductive: Prostate is unremarkable. Other: No abdominal wall hernia or abnormality. No abdominopelvic ascites. Musculoskeletal: Postsurgical changes are seen involving proximal right femur. No acute osseous abnormality is noted. IMPRESSION: Cholelithiasis. Hepatic steatosis. No acute abnormality seen in the abdomen or pelvis. Aortic Atherosclerosis (ICD10-I70.0). Electronically Signed   By: Lupita Raider M.D.   On: 04/20/2023 12:26     Labs:   Basic Metabolic Panel: Recent Labs  Lab 04/20/23 0944 04/20/23 1852 04/21/23 0451 04/22/23 1035 04/23/23 0439  NA 123* 124* 127* 128* 131*  K 3.2* 3.4* 3.0* 3.0* 2.8*  CL 83* 91* 97* 98 100  CO2 18* 17* 19* 20* 22  GLUCOSE 188* 231* 139* 194* 133*  BUN 25* 25* 22* 11 8  CREATININE 4.29* 3.27* 2.24* 1.07 0.82  CALCIUM 10.4* 8.6* 8.5* 8.3* 8.2*  MG  --   --   --  1.5*  --   PHOS  --   --   --  2.3*  --    GFR Estimated Creatinine Clearance: 147.1 mL/min (by C-G formula based on SCr of 0.82 mg/dL). Liver Function Tests: Recent Labs  Lab 04/20/23 0944 04/21/23 0451  AST 59* 62*  ALT 27 25  ALKPHOS 100 58  BILITOT 1.4* 0.9  PROT 10.9* 7.7  ALBUMIN 5.4* 3.9   No results for input(s): "LIPASE", "AMYLASE" in the last 168 hours. No results for input(s): "AMMONIA" in the last 168 hours. Coagulation profile No results for input(s): "INR", "PROTIME" in  the last 168 hours.  CBC: Recent Labs  Lab 04/20/23 0944 04/21/23 0451 04/22/23 1035 04/23/23 0439  WBC 9.5 7.1 4.8 4.9   NEUTROABS  --   --  2.4 2.3  HGB 14.6 11.6* 11.4* 11.3*  HCT 42.3 33.4* 33.3* 32.9*  MCV 86.0 86.5 88.1 87.5  PLT 241 208 226 241   Cardiac Enzymes: Recent Labs  Lab 04/20/23 0944  CKTOTAL 699*   BNP: Invalid input(s): "POCBNP" CBG: Recent Labs  Lab 04/22/23 1235 04/22/23 1642 04/22/23 2217 04/23/23 0739 04/23/23 1136  GLUCAP 175* 155* 162* 163* 110*   D-Dimer No results for input(s): "DDIMER" in the last 72 hours. Hgb A1c No results for input(s): "HGBA1C" in the last 72 hours. Lipid Profile No results for input(s): "CHOL", "HDL", "LDLCALC", "TRIG", "CHOLHDL", "LDLDIRECT" in the last 72 hours. Thyroid function studies No results for input(s): "TSH", "T4TOTAL", "T3FREE", "THYROIDAB" in the last 72 hours.  Invalid input(s): "FREET3" Anemia work up No results for input(s): "VITAMINB12", "FOLATE", "FERRITIN", "TIBC", "IRON", "RETICCTPCT" in the last 72 hours. Microbiology No results found for this or any previous visit (from the past 240 hour(s)).  Time coordinating discharge: 45 minutes  Signed: Mica Ramdass  [facility] 04/23/2023, 1:04 PM

## 2023-04-23 NOTE — TOC Transition Note (Addendum)
Transition of Care [facility]) - CM/SW Discharge Note   Patient Details  Name: Richard Lopez MRN: [id number] Date of Birth: [birth year]  [facility] CM/SW Contact:  Howell Rucks, RN Phone Number: 04/23/2023, 1:33 PM   Clinical Narrative:   Dc to home order, met with pt at bedside, pt reports he does not have transportation. NCM will arrange [employer] for transportation home. Mental Health Resources added to AVS per pt's request. No further TOC needs identified.   -1:51pm [facility] [employer] called for transportation.     Final next level of care: Home/Self Care Barriers to Discharge: Barriers Resolved   Patient Goals and CMS Choice      Discharge Placement                         Discharge Plan and Services Additional resources added to the After Visit Summary for                                       Social Determinants of Health (SDOH) Interventions SDOH Screenings   Food Insecurity: No Food Insecurity (04/20/2023)  Housing: Low Risk  (04/20/2023)  Transportation Needs: No Transportation Needs (04/20/2023)  Utilities: Not At Risk (04/20/2023)  Depression (PHQ2-9): Low Risk  (11/07/2020)  Financial Resource Strain: Medium Risk (11/07/2018)  Physical Activity: Unknown (11/07/2018)  Social Connections: Unknown (11/07/2018)  Stress: Stress Concern Present (11/07/2018)  Tobacco Use: Medium Risk (04/20/2023)     Readmission Risk Interventions    04/21/2023    2:37 PM  Readmission Risk Prevention Plan  Transportation Screening Complete  PCP or Specialist Appt within 3-5 Days Complete  HRI or Home Care Consult Complete  Social Work Consult for Recovery Care Planning/Counseling Complete  Palliative Care Screening Not Applicable  Medication Review [employer]) Complete

## 2023-04-24 ENCOUNTER — Telehealth (INDEPENDENT_AMBULATORY_CARE_PROVIDER_SITE_OTHER): Payer: Self-pay

## 2023-04-24 NOTE — Transitions of Care (Post Inpatient/ED Visit) (Signed)
04/24/2023  Name: Richard Lopez MRN: [id number] DOB: 11-01-1976  Today's TOC FU Call Status: Today's TOC FU Call Status:: Successful TOC FU Call Competed TOC FU Call Complete Date: 04/24/23  Transition Care Management Follow-up Telephone Call Date of Discharge: 04/23/23 Discharge Facility: [facility]) Type of Discharge: Inpatient Admission Primary Inpatient Discharge Diagnosis:: Acute Renal Failure How have you been since you were released from the hospital?: Better Any questions or concerns?: No  Items Reviewed: Did you receive and understand the discharge instructions provided?: Yes Medications obtained,verified, and reconciled?: Yes (Medications Reviewed) Any new allergies since your discharge?: No Dietary orders reviewed?: NA Do you have support at home?: Yes People in Home: alone  Medications Reviewed Today: Medications Reviewed Today     Reviewed by Melton Krebs, RN (Registered Nurse) on 04/20/23 at 1520  Med List Status: Complete   Medication Order Taking? Sig Documenting Provider Last Dose Status Informant  acetaminophen (TYLENOL) 500 MG tablet [id number] Yes Take 1,000 mg by mouth daily as needed for mild pain. [provider] 04/19/2023 Active Self, Pharmacy Records  amLODipine (NORVASC) 10 MG tablet [id number] No Take 1 tablet (10 mg total) by mouth daily.  Patient not taking: Reported on 04/20/2023   Merlene Laughter, DO Not Taking Active Self, Pharmacy Records  diclofenac Sodium (VOLTAREN) 1 % GEL [id number] Yes Apply 1 Application topically as needed (pain). [provider] 04/18/2023 Active Self, Pharmacy Records  feeding supplement (ENSURE ENLIVE / ENSURE PLUS) LIQD [id number] Yes Take 237 mLs by mouth 3 (three) times daily between meals.  Patient taking differently: Take 237 mLs by mouth daily.   Marguerita Merles [place], [place] 04/18/2023 Active Self, Pharmacy Records  Multiple Vitamin (MULTIVITAMIN WITH MINERALS) TABS tablet [id number] Yes Take 1  tablet by mouth daily. Marguerita Merles [place], [place] 04/18/2023 Active Self, Pharmacy Records  omeprazole (PRILOSEC) 20 MG capsule [id number] Yes Take 20 mg by mouth daily as needed (reflux). [provider] 04/19/2023 Active Self, Pharmacy Records  pantoprazole (PROTONIX) 40 MG tablet [id number] No Take 1 tablet (40 mg total) by mouth 2 (two) times daily.  Patient not taking: Reported on 04/20/2023   Merlene Laughter, DO Completed Course Active   polyethylene glycol (MIRALAX / GLYCOLAX) 17 g packet [id number] No Take 17 g by mouth daily.  Patient not taking: Reported on 04/20/2023   Merlene Laughter, DO Not Taking Active Self, Pharmacy Records  predniSONE (DELTASONE) 10 MG tablet [id number] No take as directed in tapering daily dose 6,5,4,3,2,1  Patient not taking: Reported on 04/20/2023   Merlene Laughter, DO Completed Course Active Self, Pharmacy Records  senna-docusate (SENOKOT-S) 8.6-50 MG tablet [id number] No Take 1 tablet by mouth at bedtime.  Patient not taking: Reported on 04/20/2023   Merlene Laughter, DO Not Taking Active Self, Pharmacy Records  sucralfate (CARAFATE) 1 g tablet [id number] No Take 1 tablet (1 g total) by mouth every 6 (six) hours for 14 days. edit per md chat  Patient not taking: Reported on 04/20/2023   Merlene Laughter, DO Completed Course Active             Home Care and Equipment/Supplies: Were Home Health Services Ordered?: No Any new equipment or medical supplies ordered?: No  Functional Questionnaire: Do you need assistance with bathing/showering or dressing?: No Do you need assistance with meal preparation?: No Do you need assistance with eating?: No Do you have difficulty maintaining continence: No Do you need assistance with getting out of bed/getting out of  a chair/moving?: No Do you have difficulty managing or taking your medications?: No  Follow up appointments reviewed: PCP Follow-up appointment confirmed?: Yes Date of PCP follow-up  appointment?: 05/04/23 Follow-up Provider: Gwinda Passe NP [facility] Follow-up appointment confirmed?: NA Do you need transportation to your follow-up appointment?: No Do you understand care options if your condition(s) worsen?: Yes-patient verbalized understanding    SIGNATURE Kandis Fantasia, LPN [facility] Advisor [facility] You Are. We Are. [employer] # [phone]

## 2023-05-04 ENCOUNTER — Encounter (INDEPENDENT_AMBULATORY_CARE_PROVIDER_SITE_OTHER): Payer: Self-pay | Admitting: Primary Care

## 2023-05-04 ENCOUNTER — Ambulatory Visit (INDEPENDENT_AMBULATORY_CARE_PROVIDER_SITE_OTHER): Payer: Medicaid Other | Admitting: Primary Care

## 2023-05-04 VITALS — BP 152/88 | HR 59 | Resp 16 | Ht 78.0 in | Wt 240.6 lb

## 2023-05-04 DIAGNOSIS — E785 Hyperlipidemia, unspecified: Secondary | ICD-10-CM

## 2023-05-04 DIAGNOSIS — F32A Depression, unspecified: Secondary | ICD-10-CM | POA: Diagnosis not present

## 2023-05-04 DIAGNOSIS — Z09 Encounter for follow-up examination after completed treatment for conditions other than malignant neoplasm: Secondary | ICD-10-CM

## 2023-05-04 DIAGNOSIS — I10 Essential (primary) hypertension: Secondary | ICD-10-CM

## 2023-05-04 DIAGNOSIS — M868X9 Other osteomyelitis, unspecified sites: Secondary | ICD-10-CM

## 2023-05-04 DIAGNOSIS — E1169 Type 2 diabetes mellitus with other specified complication: Secondary | ICD-10-CM | POA: Diagnosis not present

## 2023-05-04 DIAGNOSIS — M069 Rheumatoid arthritis, unspecified: Secondary | ICD-10-CM

## 2023-05-04 LAB — POCT GLYCOSYLATED HEMOGLOBIN (HGB A1C): HbA1c, POC (controlled diabetic range): 6.2 % (ref 0.0–7.0)

## 2023-05-04 NOTE — Progress Notes (Signed)
[facility]   Subjective:  [name] is a 47 y.o. male presents for hospital follow up and establish care.  He presented to the emergency department with complaints of progressively worse bilateral cramping of his extremities for a few days. Admit date to the hospital was 04/20/23, patient was discharged from the hospital on 04/23/23, patient was admitted for: Muscle Pain ,Type 2 diabetes mellitus with hyperlipidemia (HCC),Essential hypertension, Hypokalemia, Hyponatremia, AKI (acute kidney injury) ,Dehydration ,paroxysmal atrial fibrillation, Abnormal LFTs and Hypercalcemia. No abdominal pain, nausea, emesis, diarrhea, constipation, melena or hematochezia. No flank pain, dysuria, frequency or hematuria. No polyuria, polydipsia, polyphagia or blurred vision. He is staying with his girl friend air condition remains broken. He is requesting referral to RA and psyche. Past Medical History:  Diagnosis Date   Anxiety    Arthritis    hands and possibly knee   Depression    Diabetes mellitus    diet controlled   Essential hypertension    Gastritis    Gout    Normocytic anemia due to blood loss 08/21/2019   Pulmonary embolism (HCC)      No Known Allergies    Current Outpatient Medications on File Prior to Visit  Medication Sig Dispense Refill   acetaminophen (TYLENOL) 500 MG tablet Take 1,000 mg by mouth daily as needed for mild pain.     amLODipine (NORVASC) 10 MG tablet Take 1 tablet (10 mg total) by mouth daily. 30 tablet 1   diclofenac Sodium (VOLTAREN) 1 % GEL Apply 1 Application topically as needed (pain).     feeding supplement (ENSURE ENLIVE / ENSURE PLUS) LIQD Take 237 mLs by mouth 3 (three) times daily between meals. (Patient taking differently: Take 237 mLs by mouth daily.) 237 mL 12   Multiple Vitamin (MULTIVITAMIN WITH MINERALS) TABS tablet Take 1 tablet by mouth daily. 30 tablet 0   pantoprazole (PROTONIX) 40 MG tablet Take 1 tablet (40 mg total) by mouth  daily for 28 days. 28 tablet 0   potassium chloride SA (KLOR-CON M) 20 MEQ tablet Take 2 tablets (40 mEq total) by mouth daily for 3 days. 3 tablet 0   No current facility-administered medications on file prior to visit.   Review of System: Comprehensive ROS Pertinent positive and negative noted in HPI    Objective:  Blood Pressure (Abnormal) 152/88 (BP Location: Right Arm, Patient Position: Sitting, Cuff Size: Large)   Pulse (Abnormal) 59   Respiration 16   Height 6\' 6"  (1.981 m)   Weight 240 lb 9.6 oz (109.1 kg)   Oxygen Saturation 100%   Body Mass Index 27.80 kg/m   Filed Weights   05/04/23 0946  Weight: 240 lb 9.6 oz (109.1 kg)    Physical Exam: General Appearance: Well nourished, in no apparent distress. Eyes: PERRLA, EOMs, conjunctiva no swelling or erythema Sinuses: No Frontal/maxillary tenderness ENT/Mouth: Ext aud canals clear, TMs without erythema, bulging. No erythema, swelling, or exudate on post pharynx.  Tonsils not swollen or erythematous. Hearing normal.  Neck: Supple, thyroid normal.  Respiratory: Respiratory effort normal, BS equal bilaterally without rales, rhonchi, wheezing or stridor.  Cardio: RRR with no MRGs. Brisk peripheral pulses without edema.  Abdomen: Soft, + BS.  Non tender, no guarding, rebound, hernias, masses. Lymphatics: Non tender without lymphadenopathy.  Musculoskeletal:  normal gait see picture  Skin: Warm, dry without rashes, lesions, ecchymosis.  Neuro: Cranial nerves intact. Normal muscle tone, no cerebellar symptoms. Sensation intact.  Psych: Awake and oriented X 3,  normal affect, Insight and Judgment appropriate.    Assessment:  Richard Lopez was seen today for hospitalization follow-up.  Diagnoses and all orders for this visit: Richard Lopez was seen today for hospitalization follow-up.  Diagnoses and all orders for this visit:  Type 2 diabetes mellitus with hyperlipidemia (HCC) -     POCT glycosylated hemoglobin (Hb A1C) 6.2 Prediabetes is  5.7-6.4 monitor carbohydrates -rice, potatoes, tortillas, breads, pasta, sweets, sodas.  Increase exercising to help maintain appropriate weight.   Rheumatoid arthritis with osteoperiostitis [facility])   Hospital discharge follow-up Stay hydrated Ensure compliance with Protonix, amlodipine Continue potassium chloride 40 mEq for 3 days.   Depression, unspecified depression type Referred to [facility] -     Ambulatory referral to Psychiatry  Essential hypertension BP goal - < 130/80 Explained that having normal blood pressure is the goal and medications are helping to get to goal and maintain normal blood pressure. DIET: Limit salt intake, read nutrition labels to check salt content, limit fried and high fatty foods  Avoid using multisymptom OTC cold preparations that generally contain sudafed which can rise BP. Consult with pharmacist on best cold relief products to use for persons with HTN EXERCISE Discussed incorporating exercise such as walking - 30 minutes most days of the week and can do in 10 minute intervals       This note has been created with Education officer, environmental. Any transcriptional errors are unintentional.   [name], NP 05/04/2023, 1:45 PM

## 2023-05-04 NOTE — Patient Instructions (Signed)
Hypertension, Adult High blood pressure (hypertension) is when the force of blood pumping through the arteries is too strong. The arteries are the blood vessels that carry blood from the heart throughout the body. Hypertension forces the heart to work harder to pump blood and may cause arteries to become narrow or stiff. Untreated or uncontrolled hypertension can lead to a heart attack, heart failure, a stroke, kidney disease, and other problems. A blood pressure reading consists of a higher number over a lower number. Ideally, your blood pressure should be below 120/80. The first ("top") number is called the systolic pressure. It is a measure of the pressure in your arteries as your heart beats. The second ("bottom") number is called the diastolic pressure. It is a measure of the pressure in your arteries as the heart relaxes. What are the causes? The exact cause of this condition is not known. There are some conditions that result in high blood pressure. What increases the risk? Certain factors may make you more likely to develop high blood pressure. Some of these risk factors are under your control, including: Smoking. Not getting enough exercise or physical activity. Being overweight. Having too much fat, sugar, calories, or salt (sodium) in your diet. Drinking too much alcohol. Other risk factors include: Having a personal history of heart disease, diabetes, high cholesterol, or kidney disease. Stress. Having a family history of high blood pressure and high cholesterol. Having obstructive sleep apnea. Age. The risk increases with age. What are the signs or symptoms? High blood pressure may not cause symptoms. Very high blood pressure (hypertensive crisis) may cause: Headache. Fast or irregular heartbeats (palpitations). Shortness of breath. Nosebleed. Nausea and vomiting. Vision changes. Severe chest pain, dizziness, and seizures. How is this diagnosed? This condition is diagnosed by  measuring your blood pressure while you are seated, with your arm resting on a flat surface, your legs uncrossed, and your feet flat on the floor. The cuff of the blood pressure monitor will be placed directly against the skin of your upper arm at the level of your heart. Blood pressure should be measured at least twice using the same arm. Certain conditions can cause a difference in blood pressure between your right and left arms. If you have a high blood pressure reading during one visit or you have normal blood pressure with other risk factors, you may be asked to: Return on a different day to have your blood pressure checked again. Monitor your blood pressure at home for 1 week or longer. If you are diagnosed with hypertension, you may have other blood or imaging tests to help your health care provider understand your overall risk for other conditions. How is this treated? This condition is treated by making healthy lifestyle changes, such as eating healthy foods, exercising more, and reducing your alcohol intake. You may be referred for counseling on a healthy diet and physical activity. Your health care provider may prescribe medicine if lifestyle changes are not enough to get your blood pressure under control and if: Your systolic blood pressure is above 130. Your diastolic blood pressure is above 80. Your personal target blood pressure may vary depending on your medical conditions, your age, and other factors. Follow these instructions at home: Eating and drinking  Eat a diet that is high in fiber and potassium, and low in sodium, added sugar, and fat. An example of this eating plan is called the DASH diet. DASH stands for Dietary Approaches to Stop Hypertension. To eat this way: Eat   plenty of fresh fruits and vegetables. Try to fill one half of your plate at each meal with fruits and vegetables. Eat whole grains, such as whole-wheat pasta, brown rice, or whole-grain bread. Fill about one  fourth of your plate with whole grains. Eat or drink low-fat dairy products, such as skim milk or low-fat yogurt. Avoid fatty cuts of meat, processed or cured meats, and poultry with skin. Fill about one fourth of your plate with lean proteins, such as fish, chicken without skin, beans, eggs, or tofu. Avoid pre-made and processed foods. These tend to be higher in sodium, added sugar, and fat. Reduce your daily sodium intake. Many people with hypertension should eat less than 1,500 mg of sodium a day. Do not drink alcohol if: Your health care provider tells you not to drink. You are pregnant, may be pregnant, or are planning to become pregnant. If you drink alcohol: Limit how much you have to: 0-1 drink a day for women. 0-2 drinks a day for men. Know how much alcohol is in your drink. In the [place] one drink equals one 12 oz bottle of beer (355 mL), one 5 oz glass of wine (148 mL), or one 1 oz glass of hard liquor (44 mL). Lifestyle  Work with your health care provider to maintain a healthy body weight or to lose weight. Ask what an ideal weight is for you. Get at least 30 minutes of exercise that causes your heart to beat faster (aerobic exercise) most days of the week. Activities may include walking, swimming, or biking. Include exercise to strengthen your muscles (resistance exercise), such as Pilates or lifting weights, as part of your weekly exercise routine. Try to do these types of exercises for 30 minutes at least 3 days a week. Do not use any products that contain nicotine or tobacco. These products include cigarettes, chewing tobacco, and vaping devices, such as e-cigarettes. If you need help quitting, ask your health care provider. Monitor your blood pressure at home as told by your health care provider. Keep all follow-up visits. This is important. Medicines Take over-the-counter and prescription medicines only as told by your health care provider. Follow directions carefully. Blood  pressure medicines must be taken as prescribed. Do not skip doses of blood pressure medicine. Doing this puts you at risk for problems and can make the medicine less effective. Ask your health care provider about side effects or reactions to medicines that you should watch for. Contact a health care provider if you: Think you are having a reaction to a medicine you are taking. Have headaches that keep coming back (recurring). Feel dizzy. Have swelling in your ankles. Have trouble with your vision. Get help right away if you: Develop a severe headache or confusion. Have unusual weakness or numbness. Feel faint. Have severe pain in your chest or abdomen. Vomit repeatedly. Have trouble breathing. These symptoms may be an emergency. Get help right away. Call 911. Do not wait to see if the symptoms will go away. Do not drive yourself to the hospital. Summary Hypertension is when the force of blood pumping through your arteries is too strong. If this condition is not controlled, it may put you at risk for serious complications. Your personal target blood pressure may vary depending on your medical conditions, your age, and other factors. For most people, a normal blood pressure is less than 120/80. Hypertension is treated with lifestyle changes, medicines, or a combination of both. Lifestyle changes include losing weight, eating a healthy,   low-sodium diet, exercising more, and limiting alcohol. This information is not intended to replace advice given to you by your health care provider. Make sure you discuss any questions you have with your health care provider. Document Revised: 08/27/2021 Document Reviewed: 08/27/2021 Elsevier Patient Education  2024 [employer]  

## 2023-05-11 ENCOUNTER — Telehealth (INDEPENDENT_AMBULATORY_CARE_PROVIDER_SITE_OTHER): Payer: Self-pay | Admitting: Licensed Clinical Social Worker

## 2023-05-11 NOTE — Telephone Encounter (Signed)
LCSWA called patient today to introduce herself and to assess patients' mental health needs. Patient did not answer the phone. LCSWA was able to leave a brief message with the patient asking them to return the call. Patient was referred by PCP for Depression, unspecified depression type.  Counseling Resources   [email or web address]  [facility] [address], [place], [place] [address] [phone] or [phone] Walk-in urgent care 24/7 for anyone  For [name] ONLY New patient assessments and therapy walk-ins: Monday and Wednesday 8am-11am First and second Friday 1pm-5pm New patient psychiatry and medication management walk-ins: Mondays, Wednesdays, Thursdays, Fridays 8am-11am No psychiatry walk-ins on Tuesday   *Accepts all insurance and uninsured for Urgent Care needs *Accepts Medicaid and uninsured for outpatient treatment   [facility] (Therapy and psychiatry) [facility] (near [employer]) [address], [address] [place], [place] [address] [phone] Fax: [phone] (INSURANCE REQUIRED-MEDICAID ACCEPTED)   [facility] Address: Four Locations  -[address] [place], [place], [place]                                 Phone: [phone] -[address], [place], [place]         Phone: [phone] -[address], [place], [place]                      Phone: [phone] -[address], [place], [place]         Phone: [phone] Age Range: Children, Adolescents, and Adults Specialty Areas: Depression, Anxiety, ADHD, Substance Abuse, Bipolar Disorder, etc.  [facility] Address: [address] [place], [place] Phone: [phone] Age Range: Children, Adults, and Elders Specialty Areas: Couple, Family, Group, Individual     [facility] Address: [address] [place],  [place] Phone: [phone] Age Range: Children, Adolescents, and Adults Specialty Areas: Individual, Family and Couples Therapy, and Substance Abuse  [facility] Address: [address] [place], [place] [address] Phone: [phone] Age Range: Adolescents/Teenagers and Adults Specialty Areas: Individual, Family, Couples, and Group Counseling   [facility] Address: [address] [place], [place] Phone: [phone] Age Range: Children and Adults Specialty Areas: Individual, Group, and Family counseling to people who have experienced domestic violence or sexual assault  [facility] Address: [address] [place], [place] [address] Phone: [phone] Age Range: Adults & Children (Ages 3+) Specialty Areas: Mental Health and Substance Abuse Counseling Services  [facility] Address: [address] [place], [place], [place] [address] Phone: [phone]             Age Range: All Ages  Specialty Areas: Common mental health diagnoses such as Anxiety, Depression, ADD/ADHD, Bipolar, and PTSD, Substance Abuse Evaluations and Counseling   [facility] at [facility] [address] [place] [address] [place],  [place]  [address] [phone] Call for appointment  [facility] (Therapy only)  [facility] [address] [place], [place] [address] Monday - Friday: 8:30 a.m.-12 p.m. / 1 p.m.-2:30 p.m.  [facility] [address] [place], [place] [address] Monday-Friday: 8:30 a.m.-12 p.m. / 2-3:30 p.m. (INSURANCE REQUIRED -MEDICAID ACCEPTED) They do offer a sliding fee scale $20-$30/session   [facility] [address] [place], [place] [address] Phone: [phone]  [facility] [address] [place] [address]  Phone: [phone] (Does not accept Medicaid) (only one provider accepts Medicare)  [facility] [address] (at [employer], [place] [phone] (Accepts Medicaid and Medicare)  [facility]) [address]  [place], [place] [address]  Phone: [phone]  [address] [place], [place] [address] Phone: [phone] [facility] Medicaid) [facility] (Therapy only)  [address], [place], [place] [address] Phone: [phone]   [facility] (Therapy only)  [address] [place], [place] [address] Phone: [phone] [facility] & Medicare)   [facility] [address], [place], [place] [address] Phone: [phone] (Accepts [place] Health Choice) [employer] 704 280 [address] [place], [place] [address] Phone: [phone] [facility]) [facility] (Psychiatry only)  [phone] [address], [place], [place] [address] (Accepts Medicaid and Medicare) [facility] (Psychiatry and therapy)  [address] Lonell Grandchild [place], [place] [address] [phone] [facility] Medicare) [facility] (psychiatry and therapy) [address], [place], [place] [address] [phone]  [facility] at [phone], [place], [place] [address] [phone] Accept [address] [place], [place], [place], [place],  and the following types of Medicaid; [place], [place], Partners, [place], [place] Health Choice, [employer], Healthy [place], [place], and [place], as well as offering a Careers adviser and private payment options. Provides In-Office Appointments, Virtual Appointments, and Phone Consultations Offers medication management for ages 89 years old and up, including,  Medication Management for Suboxone and [facility] [phone] [address], [place], [place] [address] (Accepts Medicaid and Medicare)         19.  [facility] (therapy only)  [address] [place], [place] [address]            [phone] (Accepts medicare) 20. [facility]  (Suboxone and methodone) [phone] [address], [place], [place] [address] To Be Eligible for Opioid Treatment at [facility] you must be at least 46 years of age you have already tried other interventions that were not successful such as opioid detox, inpatient rehab for opioids, or outpatient counseling specifically for opioid dependency your [facility] drug test must be completely free of benzodiazepines (klonopin, xanax, valium, ativan, or other benz) you have reliable transportation to the [facility] clinic in [place] you recognize that counseling is a critical component of [facility]' Opioid Program and you agree to attend all required counseling sessions you are committed to total drug abstinence and will conscientiously strive to remain free of alcohol, marijuana, and other illicit substances while in treatment you desire a peaceful treatment atmosphere in which personal responsibility and respect toward staff and clients is the norm   21. [facility] [address] [place], [place], [place] [address] Offers SAIOP (Substance Abuse Intensive Outpatient Program) [phone] 22. [facility] [address] [place], [place] [address] [phone] (Accepts medicare)  For those who are tech savvy, go on psychology today, type in your local city (i.e. [place]. [place]) and specify your insurance at the top of the screen after you search. (Medicaid if needed). You can also specify whether you are interested in therapy and psychiatry.  www.psychologytoday.com/us

## 2023-05-13 ENCOUNTER — Emergency Department (HOSPITAL_COMMUNITY)
Admission: EM | Admit: 2023-05-13 | Discharge: 2023-05-14 | Disposition: A | Discharge status: Home / Self Care | Payer: Medicaid Other | Attending: Emergency Medicine | Admitting: Emergency Medicine

## 2023-05-13 ENCOUNTER — Encounter (HOSPITAL_COMMUNITY): Payer: Self-pay | Admitting: Emergency Medicine

## 2023-05-13 DIAGNOSIS — Z79899 Other long term (current) drug therapy: Secondary | ICD-10-CM | POA: Insufficient documentation

## 2023-05-13 DIAGNOSIS — R252 Cramp and spasm: Secondary | ICD-10-CM | POA: Diagnosis not present

## 2023-05-13 DIAGNOSIS — I10 Essential (primary) hypertension: Secondary | ICD-10-CM | POA: Insufficient documentation

## 2023-05-13 DIAGNOSIS — R197 Diarrhea, unspecified: Secondary | ICD-10-CM | POA: Insufficient documentation

## 2023-05-13 DIAGNOSIS — E860 Dehydration: Secondary | ICD-10-CM | POA: Insufficient documentation

## 2023-05-13 DIAGNOSIS — R5383 Other fatigue: Secondary | ICD-10-CM | POA: Diagnosis present

## 2023-05-13 DIAGNOSIS — E119 Type 2 diabetes mellitus without complications: Secondary | ICD-10-CM | POA: Diagnosis not present

## 2023-05-13 LAB — CBC WITH DIFFERENTIAL/PLATELET
Abs Immature Granulocytes: 0.01 10*3/uL (ref 0.00–0.07)
Basophils Absolute: 0 10*3/uL (ref 0.0–0.1)
Basophils Relative: 0 %
Eosinophils Absolute: 0 10*3/uL (ref 0.0–0.5)
Eosinophils Relative: 1 %
HCT: 37 % — ABNORMAL LOW (ref 39.0–52.0)
Hemoglobin: 12.6 g/dL — ABNORMAL LOW (ref 13.0–17.0)
Immature Granulocytes: 0 %
Lymphocytes Relative: 23 %
Lymphs Abs: 1.3 10*3/uL (ref 0.7–4.0)
MCH: 29.9 pg (ref 26.0–34.0)
MCHC: 34.1 g/dL (ref 30.0–36.0)
MCV: 87.7 fL (ref 80.0–100.0)
Monocytes Absolute: 0.3 10*3/uL (ref 0.1–1.0)
Monocytes Relative: 5 %
Neutro Abs: 4 10*3/uL (ref 1.7–7.7)
Neutrophils Relative %: 71 %
Platelets: 118 10*3/uL — ABNORMAL LOW (ref 150–400)
RBC: 4.22 MIL/uL (ref 4.22–5.81)
RDW: 14.3 % (ref 11.5–15.5)
WBC: 5.6 10*3/uL (ref 4.0–10.5)
nRBC: 0 % (ref 0.0–0.2)

## 2023-05-13 LAB — BASIC METABOLIC PANEL
Anion gap: 14 (ref 5–15)
BUN: 9 mg/dL (ref 6–20)
CO2: 25 mmol/L (ref 22–32)
Calcium: 8.4 mg/dL — ABNORMAL LOW (ref 8.9–10.3)
Chloride: 90 mmol/L — ABNORMAL LOW (ref 98–111)
Creatinine, Ser: 1.01 mg/dL (ref 0.61–1.24)
GFR, Estimated: >60 mL/min (ref >60)
Glucose, Bld: 99 mg/dL (ref 70–99)
Potassium: 3 mmol/L — ABNORMAL LOW (ref 3.5–5.1)
Sodium: 129 mmol/L — ABNORMAL LOW (ref 135–145)

## 2023-05-13 LAB — URINALYSIS, ROUTINE W REFLEX MICROSCOPIC
Bacteria, UA: NONE SEEN
Bilirubin Urine: NEGATIVE
Glucose, UA: NEGATIVE mg/dL
Hgb urine dipstick: NEGATIVE
Ketones, ur: 5 mg/dL — AB
Nitrite: NEGATIVE
Protein, ur: NEGATIVE mg/dL
Specific Gravity, Urine: 1.004 — ABNORMAL LOW (ref 1.005–1.030)
pH: 7 (ref 5.0–8.0)

## 2023-05-13 MED ORDER — SODIUM CHLORIDE 0.9 % IV BOLUS
1000.0000 mL | Freq: Once | INTRAVENOUS | Status: AC
  Administered 2023-05-13: 1000 mL via INTRAVENOUS

## 2023-05-13 MED ORDER — ONDANSETRON HCL 4 MG/2ML IJ SOLN
4.0000 mg | Freq: Once | INTRAMUSCULAR | Status: AC
  Administered 2023-05-13: 4 mg via INTRAVENOUS
  Filled 2023-05-13: qty 2

## 2023-05-13 MED ORDER — POTASSIUM CHLORIDE 10 MEQ/100ML IV SOLN
10.0000 meq | Freq: Once | INTRAVENOUS | Status: AC
  Administered 2023-05-13: 10 meq via INTRAVENOUS
  Filled 2023-05-13: qty 100

## 2023-05-13 NOTE — ED Provider Notes (Signed)
[place] EMERGENCY DEPARTMENT AT [facility] Provider Note   CSN: [id number] Arrival date & time: 05/13/23  1951     History  Chief Complaint  Patient presents with   Dehydration    Richard Lopez is a 47 y.o. male with past medical history significant for diabetes, hypertension, polysubstance use, pulmonary embolism, depression presents to the ED complaining of dehydration and extremity cramping.  Patient states he has been staying in his house without [facility] for the past week.  Patient reports only urinating once in the past 2 days.  He has had vomiting since yesterday and is unable to keep down solids.  He has been drinking water and reports increased intake, but constant sweating due to being in a hot environment.  He has generalized body aches, fatigue, and a couple episode of diarrhea as well.        Home Medications Prior to Admission medications   Medication Sig Start Date End Date Taking? Authorizing Provider  acetaminophen (TYLENOL) 500 MG tablet Take 1,000 mg by mouth daily as needed for mild pain.    [provider]  amLODipine (NORVASC) 10 MG tablet Take 1 tablet (10 mg total) by mouth daily. 04/23/23 07/22/23  Lorin Glass, MD  diclofenac Sodium (VOLTAREN) 1 % GEL Apply 1 Application topically as needed (pain).    [provider]  feeding supplement (ENSURE ENLIVE / ENSURE PLUS) LIQD Take 237 mLs by mouth 3 (three) times daily between meals. Patient taking differently: Take 237 mLs by mouth daily. 11/15/22   Marguerita Merles Latif, DO  Multiple Vitamin (MULTIVITAMIN WITH MINERALS) TABS tablet Take 1 tablet by mouth daily. 11/16/22   Marguerita Merles Latif, DO  pantoprazole (PROTONIX) 40 MG tablet Take 1 tablet (40 mg total) by mouth daily for 28 days. 04/23/23 05/21/23  Lorin Glass, MD  potassium chloride SA (KLOR-CON M) 20 MEQ tablet Take 2 tablets (40 mEq total) by mouth daily for 3 days. 04/23/23 04/26/23  Lorin Glass, MD      Allergies    Patient  has no known allergies.    Review of Systems   Review of Systems  Constitutional:  Positive for fatigue.  Gastrointestinal:  Positive for diarrhea, nausea and vomiting.  Musculoskeletal:  Positive for myalgias.  Neurological:  Negative for dizziness, syncope, weakness and light-headedness.    Physical Exam Updated Vital Signs BP (!) 160/100 (BP Location: Right Arm)   Pulse 93   Temp 98.3 F (36.8 C) (Oral)   Resp 17   SpO2 100%  Physical Exam Vitals and nursing note reviewed.  Constitutional:      General: He is not in acute distress.    Appearance: Normal appearance. He is not ill-appearing or diaphoretic.  HENT:     Mouth/Throat:     Mouth: Mucous membranes are moist.  Cardiovascular:     Rate and Rhythm: Normal rate and regular rhythm.     Heart sounds: Normal heart sounds.  Pulmonary:     Effort: Pulmonary effort is normal.  Abdominal:     General: Abdomen is flat.     Palpations: Abdomen is soft.     Tenderness: There is no abdominal tenderness.  Musculoskeletal:     Right lower leg: No edema.     Left lower leg: No edema.  Skin:    General: Skin is warm and dry.     Capillary Refill: Capillary refill takes less than 2 seconds.  Neurological:     Mental Status: He is  alert. Mental status is at baseline.  Psychiatric:        Mood and Affect: Mood normal.        Behavior: Behavior normal.     ED Results / Procedures / Treatments   Labs (all labs ordered are listed, but only abnormal results are displayed) Labs Reviewed  BASIC METABOLIC PANEL - Abnormal; Notable for the following components:      Result Value   Sodium 129 (*)    Potassium 3.0 (*)    Chloride 90 (*)    Calcium 8.4 (*)    All other components within normal limits  CBC WITH DIFFERENTIAL/PLATELET - Abnormal; Notable for the following components:   Hemoglobin 12.6 (*)    HCT 37.0 (*)    Platelets 118 (*)    All other components within normal limits  URINALYSIS, ROUTINE W REFLEX  MICROSCOPIC    EKG None  Radiology No results found.  Procedures Procedures    Medications Ordered in ED Medications  potassium chloride 10 mEq in 100 mL IVPB (has no administration in time range)  sodium chloride 0.9 % bolus 1,000 mL (1,000 mLs Intravenous New Bag/Given 05/13/23 2044)  ondansetron (ZOFRAN) injection 4 mg (4 mg Intravenous Given 05/13/23 2200)    ED Course/ Medical Decision Making/ A&P                             Medical Decision Making Amount and/or Complexity of Data Reviewed Labs: ordered.   This patient presents to the ED with chief complaint(s) of vomiting, cramps, dehydration with pertinent past medical history of diabetes, hypertension.  The complaint involves an extensive differential diagnosis and also carries with it a high risk of complications and morbidity.    The differential diagnosis includes dehydration, AKI, metabolic derangement, electrolyte disturbance, acute gastroenteritis   The initial plan is to obtain basic labs, UA  Additional history obtained: Records reviewed previous admission documents - patient was admitted on 04/20/23 for dehydration and acute renal failure.  Patient had similar circumstances of being in a hot house without AC.    Initial Assessment:   Exam significant for overall well appearing patient who is not in acute distress.  Skin is warm and dry.  Mucus membranes are moist.    Independent ECG/labs interpretation:  The following labs were independently interpreted:  CBC with anemia, appears chronic.  Metabolic panel with hyponatremia, hypokalemia, and hypocalcemia.  Anion gap normal.  Renal function normal.    Treatment and Reassessment: Patient given IV fluids for rehydration and Zofran for nausea.  Will also give potassium for hypokalemia.    Disposition:   10:03 PM Care of transferred to [facility], PA-C at the end of my shift as the patient will require reassessment once labs/imaging have resulted. Patient  presentation, ED course, and plan of care discussed with review of all pertinent labs and imaging. Please see his/her note for further details regarding further ED course and disposition. Plan at time of handoff is rehydrate patient, give potassium, ensure patient is able to urinate, eat and drink.  If patient improves and is able to tolerate PO intake, he may be discharged. This may be altered or completely changed at the discretion of the oncoming team pending results of further workup.   Social Determinants of Health:   Patient's  marijuana abuse   increases the complexity of managing their presentation         Final Clinical Impression(s) /  ED Diagnoses Final diagnoses:  Dehydration  Muscle cramps    Rx / DC Orders ED Discharge Orders     None         Lenard Simmer, PA-C 05/13/23 2203    Lonell Grandchild, MD 05/14/23 1928

## 2023-05-13 NOTE — ED Triage Notes (Signed)
Pt been staying in home without AC a week. States he is dehydrated. Only urinated once in 2 days. Leg cramps and back hurting. Vomited yesterday.

## 2023-05-14 MED ORDER — LACTATED RINGERS IV BOLUS
1000.0000 mL | Freq: Once | INTRAVENOUS | Status: AC
  Administered 2023-05-14: 1000 mL via INTRAVENOUS

## 2023-05-14 MED ORDER — POTASSIUM CHLORIDE 10 MEQ/100ML IV SOLN
10.0000 meq | INTRAVENOUS | Status: AC
  Administered 2023-05-14 (×3): 10 meq via INTRAVENOUS
  Filled 2023-05-14: qty 100

## 2023-05-14 NOTE — ED Provider Notes (Signed)
Signout received on this 47 year old male who presented for concern of dehydration.  At this time of signout patient is awaiting p.o. challenge.  If he is able to tolerate p.o. challenge he is appropriate for discharge. Physical Exam  BP (!) 142/68 (BP Location: Right Arm)   Pulse 91   Temp 99.3 F (37.4 C) (Oral)   Resp 17   SpO2 99%    Procedures  Procedures  ED Course / MDM    Medical Decision Making Amount and/or Complexity of Data Reviewed Labs: ordered.  Risk Prescription drug management.   Patient able to tolerate p.o. challenge.  No additional fluid bolus given.  Additional IV potassium repletion given.  Please see full HPI from prior provider for complete details.       Marita [place], PA-C 05/14/23 0221    Tilden Fossa, MD 05/14/23 Emeline Darling

## 2023-05-14 NOTE — ED Notes (Signed)
Pt drank fluids without any issues but stated he had pain eating the sandwich provided.

## 2023-05-14 NOTE — ED Notes (Signed)
Po challenge passed. 

## 2023-05-14 NOTE — Discharge Instructions (Signed)
Drink plenty of fluids.  Follow-up with your primary care provider.  For any concerning symptoms return to the emergency room.  Your potassium was low but was repleted in the emergency department.  This will need to be rechecked in 48 hours to ensure that this remains stable.

## 2023-05-20 ENCOUNTER — Ambulatory Visit (INDEPENDENT_AMBULATORY_CARE_PROVIDER_SITE_OTHER): Payer: Medicaid Other | Admitting: Primary Care

## 2023-05-21 ENCOUNTER — Encounter (INDEPENDENT_AMBULATORY_CARE_PROVIDER_SITE_OTHER): Payer: Self-pay | Admitting: Primary Care

## 2023-05-21 ENCOUNTER — Ambulatory Visit (INDEPENDENT_AMBULATORY_CARE_PROVIDER_SITE_OTHER): Payer: Medicaid Other | Admitting: Primary Care

## 2023-05-21 VITALS — BP 145/84 | HR 80 | Resp 14 | Ht 78.0 in | Wt 223.4 lb

## 2023-05-21 DIAGNOSIS — F32A Depression, unspecified: Secondary | ICD-10-CM

## 2023-05-21 DIAGNOSIS — M868X9 Other osteomyelitis, unspecified sites: Secondary | ICD-10-CM | POA: Diagnosis not present

## 2023-05-21 DIAGNOSIS — I10 Essential (primary) hypertension: Secondary | ICD-10-CM

## 2023-05-21 DIAGNOSIS — M069 Rheumatoid arthritis, unspecified: Secondary | ICD-10-CM

## 2023-05-21 MED ORDER — PREDNISONE 10 MG (21) PO TBPK: ORAL_TABLET | ORAL | 0 refills | Status: DC

## 2023-05-21 NOTE — Progress Notes (Signed)
Pt is here for referral   Also complaining of left elbow pain/abscess   [facility]  Richard Lopez, is a 47 y.o. male  ZOX:[id number]  [id number]  DOB - [birth year]  Chief Complaint  Patient presents with   Pain       Subjective:   Richard Lopez is a 47 y.o. male here today for a follow up visit for increase pain in his left elbow and decrease use of arm. . Patient has No headache, No chest pain, No abdominal pain - No Nausea, No new weakness tingling or numbness, No Cough - shortness of breath  No problems updated.  No Known Allergies  Past Medical History:  Diagnosis Date   Anxiety    Arthritis    hands and possibly knee   Depression    Diabetes mellitus    diet controlled   Essential hypertension    Gastritis    Gout    Normocytic anemia due to blood loss 08/21/2019   Pulmonary embolism (HCC)     Current Outpatient Medications on File Prior to Visit  Medication Sig Dispense Refill   acetaminophen (TYLENOL) 500 MG tablet Take 1,000 mg by mouth daily as needed for mild pain.     amLODipine (NORVASC) 10 MG tablet Take 1 tablet (10 mg total) by mouth daily. 30 tablet 1   diclofenac Sodium (VOLTAREN) 1 % GEL Apply 1 Application topically as needed (pain).     feeding supplement (ENSURE ENLIVE / ENSURE PLUS) LIQD Take 237 mLs by mouth 3 (three) times daily between meals. (Patient taking differently: Take 237 mLs by mouth daily.) 237 mL 12   Multiple Vitamin (MULTIVITAMIN WITH MINERALS) TABS tablet Take 1 tablet by mouth daily. 30 tablet 0   pantoprazole (PROTONIX) 40 MG tablet Take 1 tablet (40 mg total) by mouth daily for 28 days. 28 tablet 0   potassium chloride SA (KLOR-CON M) 20 MEQ tablet Take 2 tablets (40 mEq total) by mouth daily for 3 days. (Patient not taking: Reported on 05/13/2023) 3 tablet 0   No current facility-administered medications on file prior to visit.    Objective:   Vitals:   05/21/23 1032  BP: (Abnormal) 145/84  Pulse: 80   Resp: 14  SpO2: 99%  Weight: 223 lb 6.4 oz (101.3 kg)  Height: 6\' 6"  (1.981 m)    Comprehensive ROS Pertinent positive and negative noted in HPI   Exam General appearance : Awake, alert, not in any distress. Speech Clear. Not toxic looking HEENT: Atraumatic and Normocephalic, pupils equally reactive to light and accomodation Neck: Supple, no JVD. No cervical lymphadenopathy.  Chest: Good air entry bilaterally, no added sounds  CVS: S1 S2 regular, no murmurs.  Abdomen: Bowel sounds present, Non tender and not distended with no gaurding, rigidity or rebound. Extremities left mass firm elbow Neurology: Awake alert, and oriented X 3, CN II-XII intact, Non focal Skin: No Rash  Data Review Lab Results  Component Value Date   HGBA1C 6.2 05/04/2023   HGBA1C 6.0 (H) 11/08/2022   HGBA1C 5.0 04/28/2021    Assessment & Plan  Richard Lopez was seen today for pain.  Diagnoses and all orders for this visit:  Rheumatoid arthritis with osteoperiostitis [facility]) -     Ambulatory referral to Rheumatology  Depression, unspecified depression type Flowsheet Row Office Visit [facility]/20[facility] in [facility]  PHQ-9 Total Score 16      Refer to CSW    Essential hypertension  Admits to not  taking Bp this morning woke up and straight to his appt BP goal - < 130/80 Explained that having normal blood pressure is the goal and medications are helping to get to goal and maintain normal blood pressure. DIET: Limit salt intake, read nutrition labels to check salt content, limit fried and high fatty foods  Avoid using multisymptom OTC cold preparations that generally contain sudafed which can rise BP. Consult with pharmacist on best cold relief products to use for persons with HTN EXERCISE Discussed incorporating exercise such as walking - 30 minutes most days of the week and can do in 10 minute intervals     Patient have been counseled extensively about nutrition and exercise. Other  issues discussed during this visit include: low cholesterol diet, weight control and daily exercise, foot care, annual eye examinations at Ophthalmology, importance of adherence with medications and regular follow-up. We also discussed long term complications of uncontrolled diabetes and hypertension.   No follow-ups on file.  The patient was given clear instructions to go to ER or return to medical center if symptoms don't improve, worsen or new problems develop. The patient verbalized understanding. The patient was told to call to get lab results if they haven't heard anything in the next week.   This note has been created with Education officer, environmental. Any transcriptional errors are unintentional.   [name], NP 05/21/2023, 10:45 AM

## 2023-05-25 ENCOUNTER — Telehealth (INDEPENDENT_AMBULATORY_CARE_PROVIDER_SITE_OTHER): Payer: Self-pay | Admitting: Licensed Clinical Social Worker

## 2023-05-25 NOTE — Telephone Encounter (Signed)
LCSWA called patient today to introduce herself and to assess patients' mental health needs. LCSWA was able to schedule pt an appointment for 06/04/23 Patient was referred by PCP for Depression, unspecified depression type.

## 2023-06-04 ENCOUNTER — Encounter (INDEPENDENT_AMBULATORY_CARE_PROVIDER_SITE_OTHER): Payer: Medicaid Other | Admitting: Licensed Clinical Social Worker

## 2023-06-08 ENCOUNTER — Telehealth (INDEPENDENT_AMBULATORY_CARE_PROVIDER_SITE_OTHER): Payer: Self-pay | Admitting: Licensed Clinical Social Worker

## 2023-06-08 NOTE — Telephone Encounter (Signed)
Called pt to reschedule his appt with LCSWA, pt did not answer and I was unable to leave a voicemail.

## 2023-06-11 ENCOUNTER — Ambulatory Visit (INDEPENDENT_AMBULATORY_CARE_PROVIDER_SITE_OTHER): Payer: Medicaid Other | Admitting: Licensed Clinical Social Worker

## 2023-06-11 DIAGNOSIS — F32A Depression, unspecified: Secondary | ICD-10-CM

## 2023-06-17 DIAGNOSIS — F32A Depression, unspecified: Secondary | ICD-10-CM | POA: Insufficient documentation

## 2023-06-17 NOTE — BH Specialist Note (Signed)
Integrated Behavioral Health via Telemedicine Visit  06/11/2023 Richard Lopez [id number]  Number of Integrated Behavioral Health Clinician visits: 1- Initial Visit  Session Start time: 0940   Session End time: 1010  Total time in minutes: 30   Referring Provider: PCP Patient/Family location:In car -parked [facility] Provider location: [facility] All persons participating in visit: Patient and LCSWA Types of Service: Individual psychotherapy, Video visit, and Introduction only  I connected with Richard Lopez and/or via  Telephone or Video Enabled Telemedicine Application  (Video is Caregility application) and verified that I am speaking with the correct person using two identifiers. Discussed confidentiality: Yes   I discussed the limitations of telemedicine and the availability of in person appointments.  Discussed there is a possibility of technology failure and discussed alternative modes of communication if that failure occurs.  I discussed that engaging in this telemedicine visit, they consent to the provision of behavioral healthcare and the services will be billed under their insurance.  Patient and/or legal guardian expressed understanding and consented to Telemedicine visit: Yes   Presenting Concerns: Patient and/or family reports the following symptoms/concerns: depression, feeling down often, often arguing with his girlfriend and lack of sleep.  Duration of problem: past few years but more so lately; Severity of problem: moderate  Patient and/or Family's Strengths/Protective Factors: Concrete supports in place (healthy food, safe environments, etc.)  Goals Addressed: Patient will:  Reduce symptoms of: depression and insomnia   Increase knowledge and/or ability of: coping skills and healthy habits   Demonstrate ability to: Increase healthy adjustment to current life circumstances and Increase adequate support systems for patient/family  Progress towards  Goals: Ongoing  Interventions: Interventions utilized:  CBT Cognitive Behavioral Therapy and Supportive Counseling Standardized Assessments completed: Not Needed Set Small, Achievable Goals: Break down tasks into smaller, manageable steps. Achieving these small goals can provide a sense of accomplishment and build momentum.  Incorporate Physical Activity: Regular exercise has been shown to improve mood and reduce symptoms of depression. Aim for activities you enjoy, such as walking, dancing, or yoga.  Maintain a Healthy Diet: Eating a balanced diet can impact your mood and energy levels. Focus on consuming a variety of nutritious foods and staying hydrated.  Patient and/or Family Response: Patient shared that he has some physical issues, he struggles with hand and bone issues, reports that he often feels down and depressed, he states that he take him a while to get out of bed in the mornings and he is not sleep much at all at night. Patient did share that he has a healthy relationships with his family and friends and enjoys having his own space. Patient is open to developing some skills and journaling to reduce his depressive systems. Patient is not interested in medications right now due to some psychotic features in the past from taking them.  Assessment: Patient currently experiencing depression, feeling down often, often arguing with his girlfriend and lack of sleep. .   Patient may benefit from creating a routine schedule and continued support from [facility].  Plan: Follow up with behavioral health clinician on : 4 weeks Behavioral recommendations: Set Small, Achievable Goals: Break down tasks into smaller, manageable steps. Achieving these small goals can provide a sense of accomplishment and build momentum.  Incorporate Physical Activity: Regular exercise has been shown to improve mood and reduce symptoms of depression. Aim for activities you enjoy, such as walking, dancing, or  yoga.  Maintain a Healthy Diet: Eating a balanced diet can impact your  mood and energy levels. Focus on consuming a variety of nutritious foods and staying hydrated. Referral(s): Integrated [employer] (In Clinic)  I discussed the assessment and treatment plan with the patient and/or parent/guardian. They were provided an opportunity to ask questions and all were answered. They agreed with the plan and demonstrated an understanding of the instructions.   They were advised to call back or seek an in-person evaluation if the symptoms worsen or if the condition fails to improve as anticipated.  Vassie Loll, LCSWA

## 2023-07-02 ENCOUNTER — Ambulatory Visit (INDEPENDENT_AMBULATORY_CARE_PROVIDER_SITE_OTHER): Payer: Medicaid Other | Admitting: Licensed Clinical Social Worker

## 2023-07-02 ENCOUNTER — Encounter (INDEPENDENT_AMBULATORY_CARE_PROVIDER_SITE_OTHER): Payer: Self-pay

## 2023-07-02 DIAGNOSIS — F32A Depression, unspecified: Secondary | ICD-10-CM

## 2023-07-27 ENCOUNTER — Ambulatory Visit (INDEPENDENT_AMBULATORY_CARE_PROVIDER_SITE_OTHER): Payer: Medicaid Other

## 2023-07-28 ENCOUNTER — Ambulatory Visit (INDEPENDENT_AMBULATORY_CARE_PROVIDER_SITE_OTHER): Payer: Medicaid Other

## 2023-07-28 VITALS — BP 139/91

## 2023-07-28 DIAGNOSIS — Z0130 Encounter for examination of blood pressure without abnormal findings: Secondary | ICD-10-CM

## 2023-07-28 NOTE — Progress Notes (Signed)
Pt didn't take bp medication today because he woke up late and he hasn't ate

## 2023-07-29 NOTE — BH Specialist Note (Signed)
I       Integrated Behavioral Health via Telemedicine Visit  07/29/2023 AMPARO Lopez [id number]  Number of Integrated Behavioral Health Clinician visits: 2- Second Visit  Session Start time: 0932   Session End time: 0958  Total time in minutes: 26   Referring Provider: PCP Patient/Family location: In car-parked [facility] Provider location: [facility] All persons participating in visit: Pt, LCSWA and LCSWA Intern Types of Service: Individual psychotherapy and Video visit  I connected with Richard Lopez via  Telephone or Video Enabled Telemedicine Application  (Video is Caregility application) and verified that I am speaking with the correct person using two identifiers. Discussed confidentiality: Yes   I discussed the limitations of telemedicine and the availability of in person appointments.  Discussed there is a possibility of technology failure and discussed alternative modes of communication if that failure occurs.  I discussed that engaging in this telemedicine visit, they consent to the provision of behavioral healthcare and the services will be billed under their insurance.  Patient and/or legal guardian expressed understanding and consented to Telemedicine visit: Yes   Presenting Concerns: Patient and/or family reports the following symptoms/concerns: depression, feeling down often, often arguing with his girlfriend and lack of sleep.  Duration of problem: past few years but more so lately; Severity of problem: moderate  Patient and/or Family's Strengths/Protective Factors: Concrete supports in place (healthy food, safe environments, etc.)  Goals Addressed: Patient will:  Reduce symptoms of: depression and insomnia   Increase knowledge and/or ability of: coping skills and healthy habits   Demonstrate ability to: Increase healthy adjustment to current life circumstances and Increase adequate support systems for patient/family  Progress towards  Goals: Ongoing  Interventions: Interventions utilized:  CBT Cognitive Behavioral Therapy Standardized Assessments completed: Not Needed  Patient and/or Family Response: pt shared that he has been trying to be a little more mobile, but it still often in pain.  Patient did share that he had a vulnerable moment when where he had a relapse and started to drink on the anniversary of his best friend death.  Patient states that he feels really bad at how he relapsed and showed a lot of anger towards his love ones and states that he plans to never put hisself back in that situation and would like to identify ways to cope with the anniversary of close ones deaths.  Assessment: Patient currently experiencing reduced arguments with girlfriend, still feeling down and lack of sleep and pain.   Patient may benefit from [facility]  follow ups and attending medical appts.  Plan: Follow up with behavioral health clinician on : 4 weeks Behavioral recommendations: regular activity and changing diet. Referral(s): Integrated [employer] (In Clinic)  I discussed the assessment and treatment plan with the patient and/or parent/guardian. They were provided an opportunity to ask questions and all were answered. They agreed with the plan and demonstrated an understanding of the instructions.   They were advised to call back or seek an in-person evaluation if the symptoms worsen or if the condition fails to improve as anticipated.  Vassie Loll, LCSWA

## 2023-07-30 ENCOUNTER — Encounter (INDEPENDENT_AMBULATORY_CARE_PROVIDER_SITE_OTHER): Payer: Self-pay

## 2023-07-30 ENCOUNTER — Ambulatory Visit (INDEPENDENT_AMBULATORY_CARE_PROVIDER_SITE_OTHER): Payer: Medicaid Other | Admitting: Licensed Clinical Social Worker

## 2023-07-30 DIAGNOSIS — F32A Depression, unspecified: Secondary | ICD-10-CM

## 2023-07-30 NOTE — BH Specialist Note (Signed)
Integrated Behavioral Health via Telemedicine Visit  07/30/2023 Richard Lopez [id number]  Number of Integrated Behavioral Health Clinician visits: 3- Third Visit  Session Start time: 0933   Session End time: 0958  Total time in minutes: 25   Referring Provider: PCP Patient/Family location: In car-parked [facility] Provider location: [facility] All persons participating in visit: pt, LCSWA, and LCSWA intern Types of Service: Individual psychotherapy and Video visit  I connected with Richard Lopez via  Telephone or Video Enabled Telemedicine Application  (Video is Caregility application) and verified that I am speaking with the correct person using two identifiers. Discussed confidentiality: Yes   I discussed the limitations of telemedicine and the availability of in person appointments.  Discussed there is a possibility of technology failure and discussed alternative modes of communication if that failure occurs.  I discussed that engaging in this telemedicine visit, they consent to the provision of behavioral healthcare and the services will be billed under their insurance.  Patient and/or legal guardian expressed understanding and consented to Telemedicine visit: Yes   Presenting Concerns: Patient and/or family reports the following symptoms/concerns: depression, feeling down often, often arguing with his girlfriend and lack of sleep.  Duration of problem: past few years but more so lately; Severity of problem: moderate  Patient and/or Family's Strengths/Protective Factors: Concrete supports in place (healthy food, safe environments, etc.)  Goals Addressed: Patient will:  Reduce symptoms of: depression and insomnia   Increase knowledge and/or ability of: coping skills and healthy habits   Demonstrate ability to: Increase healthy adjustment to current life circumstances and Increase adequate support systems for patient/family  Progress towards  Goals: Ongoing  Interventions: Interventions utilized:  CBT Cognitive Behavioral Therapy and Supportive Counseling Standardized Assessments completed: Not Needed  Patient and/or Family Response: Pt discussed his increase in alcohol use and how he recently experienced another death in his family. Pt is struggling to cope with difficulty situations without drinking.  Assessment: Patient currently experiencing increase in alcohol drinking.   Patient may benefit from ADS and other Free support groups to reduce alcohol use..  Plan: Follow up with behavioral health clinician on : 4 weeks Behavioral recommendations: ADS and other Free support groups to reduce alcohol use. Referral(s): [facility] (In Clinic) and   https://www.[employer]/ [employer] [facility] [address], [place] [address] [phone] Mon-Fri 6:30-10am   I discussed the assessment and treatment plan with the patient and/or parent/guardian. They were provided an opportunity to ask questions and all were answered. They agreed with the plan and demonstrated an understanding of the instructions.   They were advised to call back or seek an in-person evaluation if the symptoms worsen or if the condition fails to improve as anticipated.  Vassie Loll, LCSWA

## 2023-08-05 ENCOUNTER — Ambulatory Visit (INDEPENDENT_AMBULATORY_CARE_PROVIDER_SITE_OTHER): Payer: Medicaid Other | Admitting: Primary Care

## 2023-08-05 DIAGNOSIS — F1010 Alcohol abuse, uncomplicated: Secondary | ICD-10-CM | POA: Insufficient documentation

## 2023-08-05 DIAGNOSIS — M1A09X1 Idiopathic chronic gout, multiple sites, with tophus (tophi): Secondary | ICD-10-CM | POA: Insufficient documentation

## 2023-08-05 DIAGNOSIS — K219 Gastro-esophageal reflux disease without esophagitis: Secondary | ICD-10-CM | POA: Diagnosis present

## 2023-08-17 ENCOUNTER — Other Ambulatory Visit: Payer: Self-pay

## 2023-08-17 ENCOUNTER — Inpatient Hospital Stay (HOSPITAL_COMMUNITY)
Admission: EM | Admit: 2023-08-17 | Discharge: 2023-08-20 | DRG: 444 | Disposition: A | Discharge status: Home / Self Care | Payer: Medicaid Other | Attending: Internal Medicine | Admitting: Internal Medicine

## 2023-08-17 ENCOUNTER — Inpatient Hospital Stay (HOSPITAL_COMMUNITY): Payer: Medicaid Other

## 2023-08-17 DIAGNOSIS — Z8719 Personal history of other diseases of the digestive system: Secondary | ICD-10-CM

## 2023-08-17 DIAGNOSIS — F32A Depression, unspecified: Secondary | ICD-10-CM | POA: Diagnosis present

## 2023-08-17 DIAGNOSIS — K859 Acute pancreatitis without necrosis or infection, unspecified: Secondary | ICD-10-CM | POA: Diagnosis present

## 2023-08-17 DIAGNOSIS — Z886 Allergy status to analgesic agent status: Secondary | ICD-10-CM | POA: Diagnosis not present

## 2023-08-17 DIAGNOSIS — Z833 Family history of diabetes mellitus: Secondary | ICD-10-CM | POA: Diagnosis not present

## 2023-08-17 DIAGNOSIS — R17 Unspecified jaundice: Secondary | ICD-10-CM

## 2023-08-17 DIAGNOSIS — K209 Esophagitis, unspecified without bleeding: Secondary | ICD-10-CM

## 2023-08-17 DIAGNOSIS — N179 Acute kidney failure, unspecified: Principal | ICD-10-CM

## 2023-08-17 DIAGNOSIS — K2970 Gastritis, unspecified, without bleeding: Secondary | ICD-10-CM | POA: Diagnosis present

## 2023-08-17 DIAGNOSIS — E878 Other disorders of electrolyte and fluid balance, not elsewhere classified: Secondary | ICD-10-CM | POA: Diagnosis present

## 2023-08-17 DIAGNOSIS — R109 Unspecified abdominal pain: Secondary | ICD-10-CM | POA: Diagnosis not present

## 2023-08-17 DIAGNOSIS — R7989 Other specified abnormal findings of blood chemistry: Secondary | ICD-10-CM | POA: Diagnosis present

## 2023-08-17 DIAGNOSIS — Z86711 Personal history of pulmonary embolism: Secondary | ICD-10-CM | POA: Diagnosis not present

## 2023-08-17 DIAGNOSIS — K828 Other specified diseases of gallbladder: Secondary | ICD-10-CM | POA: Diagnosis present

## 2023-08-17 DIAGNOSIS — F17210 Nicotine dependence, cigarettes, uncomplicated: Secondary | ICD-10-CM | POA: Diagnosis present

## 2023-08-17 DIAGNOSIS — F10931 Alcohol use, unspecified with withdrawal delirium: Secondary | ICD-10-CM

## 2023-08-17 DIAGNOSIS — Z79899 Other long term (current) drug therapy: Secondary | ICD-10-CM | POA: Diagnosis not present

## 2023-08-17 DIAGNOSIS — K810 Acute cholecystitis: Secondary | ICD-10-CM | POA: Diagnosis present

## 2023-08-17 DIAGNOSIS — I10 Essential (primary) hypertension: Secondary | ICD-10-CM | POA: Diagnosis present

## 2023-08-17 DIAGNOSIS — E876 Hypokalemia: Secondary | ICD-10-CM | POA: Diagnosis not present

## 2023-08-17 DIAGNOSIS — E861 Hypovolemia: Secondary | ICD-10-CM | POA: Diagnosis present

## 2023-08-17 DIAGNOSIS — M2540 Effusion, unspecified joint: Secondary | ICD-10-CM | POA: Diagnosis present

## 2023-08-17 DIAGNOSIS — Z888 Allergy status to other drugs, medicaments and biological substances status: Secondary | ICD-10-CM | POA: Diagnosis not present

## 2023-08-17 DIAGNOSIS — M109 Gout, unspecified: Secondary | ICD-10-CM | POA: Diagnosis present

## 2023-08-17 DIAGNOSIS — Z8249 Family history of ischemic heart disease and other diseases of the circulatory system: Secondary | ICD-10-CM | POA: Diagnosis not present

## 2023-08-17 DIAGNOSIS — E119 Type 2 diabetes mellitus without complications: Secondary | ICD-10-CM | POA: Diagnosis present

## 2023-08-17 DIAGNOSIS — F10231 Alcohol dependence with withdrawal delirium: Secondary | ICD-10-CM | POA: Diagnosis not present

## 2023-08-17 DIAGNOSIS — R748 Abnormal levels of other serum enzymes: Secondary | ICD-10-CM

## 2023-08-17 DIAGNOSIS — K8020 Calculus of gallbladder without cholecystitis without obstruction: Secondary | ICD-10-CM | POA: Diagnosis present

## 2023-08-17 DIAGNOSIS — R7401 Elevation of levels of liver transaminase levels: Secondary | ICD-10-CM | POA: Diagnosis not present

## 2023-08-17 DIAGNOSIS — K2210 Ulcer of esophagus without bleeding: Secondary | ICD-10-CM | POA: Diagnosis present

## 2023-08-17 DIAGNOSIS — E860 Dehydration: Secondary | ICD-10-CM | POA: Diagnosis present

## 2023-08-17 DIAGNOSIS — E1169 Type 2 diabetes mellitus with other specified complication: Secondary | ICD-10-CM | POA: Diagnosis present

## 2023-08-17 DIAGNOSIS — E871 Hypo-osmolality and hyponatremia: Secondary | ICD-10-CM | POA: Diagnosis present

## 2023-08-17 DIAGNOSIS — E872 Acidosis, unspecified: Secondary | ICD-10-CM

## 2023-08-17 DIAGNOSIS — F419 Anxiety disorder, unspecified: Secondary | ICD-10-CM | POA: Diagnosis present

## 2023-08-17 LAB — CBC WITH DIFFERENTIAL/PLATELET
Abs Immature Granulocytes: 0.02 10*3/uL (ref 0.00–0.07)
Basophils Absolute: 0.1 10*3/uL (ref 0.0–0.1)
Basophils Relative: 1 %
Eosinophils Absolute: 0.1 10*3/uL (ref 0.0–0.5)
Eosinophils Relative: 1 %
HCT: 38.4 % — ABNORMAL LOW (ref 39.0–52.0)
Hemoglobin: 13.6 g/dL (ref 13.0–17.0)
Immature Granulocytes: 0 %
Lymphocytes Relative: 38 %
Lymphs Abs: 2.5 10*3/uL (ref 0.7–4.0)
MCH: 30.8 pg (ref 26.0–34.0)
MCHC: 35.4 g/dL (ref 30.0–36.0)
MCV: 86.9 fL (ref 80.0–100.0)
Monocytes Absolute: 1 10*3/uL (ref 0.1–1.0)
Monocytes Relative: 15 %
Neutro Abs: 3 10*3/uL (ref 1.7–7.7)
Neutrophils Relative %: 45 %
Platelets: 141 10*3/uL — ABNORMAL LOW (ref 150–400)
RBC: 4.42 MIL/uL (ref 4.22–5.81)
RDW: 14.6 % (ref 11.5–15.5)
WBC: 6.7 10*3/uL (ref 4.0–10.5)
nRBC: 0 % (ref 0.0–0.2)

## 2023-08-17 LAB — BLOOD GAS, VENOUS
Acid-base deficit: 8.6 mmol/L — ABNORMAL HIGH (ref 0.0–2.0)
Bicarbonate: 14.2 mmol/L — ABNORMAL LOW (ref 20.0–28.0)
O2 Saturation: 47.4 %
Patient temperature: 37
pCO2, Ven: 23 mm[Hg] — ABNORMAL LOW (ref 44–60)
pH, Ven: 7.4 (ref 7.25–7.43)
pO2, Ven: <31 mm[Hg] — CL (ref 32–45)

## 2023-08-17 LAB — LACTIC ACID, PLASMA: Lactic Acid, Venous: 1.4 mmol/L (ref 0.5–1.9)

## 2023-08-17 LAB — COMPREHENSIVE METABOLIC PANEL
ALT: 101 U/L — ABNORMAL HIGH (ref 0–44)
AST: 183 U/L — ABNORMAL HIGH (ref 15–41)
Albumin: 4.9 g/dL (ref 3.5–5.0)
Alkaline Phosphatase: 79 U/L (ref 38–126)
Anion gap: >20 — ABNORMAL HIGH (ref 5–15)
BUN: 20 mg/dL (ref 6–20)
CO2: 16 mmol/L — ABNORMAL LOW (ref 22–32)
Calcium: 9.3 mg/dL (ref 8.9–10.3)
Chloride: 92 mmol/L — ABNORMAL LOW (ref 98–111)
Creatinine, Ser: 2.18 mg/dL — ABNORMAL HIGH (ref 0.61–1.24)
GFR, Estimated: 37 mL/min — ABNORMAL LOW (ref >60)
Glucose, Bld: 123 mg/dL — ABNORMAL HIGH (ref 70–99)
Potassium: 3.5 mmol/L (ref 3.5–5.1)
Sodium: 131 mmol/L — ABNORMAL LOW (ref 135–145)
Total Bilirubin: 1.7 mg/dL — ABNORMAL HIGH (ref 0.3–1.2)
Total Protein: 8.5 g/dL — ABNORMAL HIGH (ref 6.5–8.1)

## 2023-08-17 LAB — BASIC METABOLIC PANEL
Anion gap: 17 — ABNORMAL HIGH (ref 5–15)
BUN: 20 mg/dL (ref 6–20)
CO2: 21 mmol/L — ABNORMAL LOW (ref 22–32)
Calcium: 8.8 mg/dL — ABNORMAL LOW (ref 8.9–10.3)
Chloride: 94 mmol/L — ABNORMAL LOW (ref 98–111)
Creatinine, Ser: 1.39 mg/dL — ABNORMAL HIGH (ref 0.61–1.24)
GFR, Estimated: >60 mL/min (ref >60)
Glucose, Bld: 89 mg/dL (ref 70–99)
Potassium: 3.6 mmol/L (ref 3.5–5.1)
Sodium: 132 mmol/L — ABNORMAL LOW (ref 135–145)

## 2023-08-17 LAB — HEPATITIS PANEL, ACUTE
HCV Ab: NONREACTIVE
Hep A IgM: NONREACTIVE
Hep B C IgM: NONREACTIVE
Hepatitis B Surface Ag: NONREACTIVE

## 2023-08-17 LAB — GLUCOSE, CAPILLARY
Glucose-Capillary: 157 mg/dL — ABNORMAL HIGH (ref 70–99)
Glucose-Capillary: 156 mg/dL — ABNORMAL HIGH (ref 70–99)

## 2023-08-17 LAB — I-STAT CG4 LACTIC ACID, ED: Lactic Acid, Venous: 2 mmol/L (ref 0.5–1.9)

## 2023-08-17 LAB — CBG MONITORING, ED
Glucose-Capillary: 158 mg/dL — ABNORMAL HIGH (ref 70–99)
Glucose-Capillary: 113 mg/dL — ABNORMAL HIGH (ref 70–99)

## 2023-08-17 LAB — BETA-HYDROXYBUTYRIC ACID: Beta-Hydroxybutyric Acid: 3.58 mmol/L — ABNORMAL HIGH (ref 0.05–0.27)

## 2023-08-17 LAB — ACETAMINOPHEN LEVEL: Acetaminophen (Tylenol), Serum: <10 ug/mL — ABNORMAL LOW (ref 10–30)

## 2023-08-17 LAB — LIPASE, BLOOD: Lipase: 72 U/L — ABNORMAL HIGH (ref 11–51)

## 2023-08-17 LAB — HEMOGLOBIN A1C
Hgb A1c MFr Bld: 5.5 % (ref 4.8–5.6)
Mean Plasma Glucose: 111.15 mg/dL

## 2023-08-17 LAB — MAGNESIUM: Magnesium: 1.4 mg/dL — ABNORMAL LOW (ref 1.7–2.4)

## 2023-08-17 MED ORDER — INSULIN ASPART 100 UNIT/ML IJ SOLN
0.0000 [IU] | Freq: Three times a day (TID) | INTRAMUSCULAR | Status: DC
  Administered 2023-08-17: 3 [IU] via SUBCUTANEOUS
  Administered 2023-08-18 – 2023-08-19 (×2): 2 [IU] via SUBCUTANEOUS
  Administered 2023-08-20: 3 [IU] via SUBCUTANEOUS
  Filled 2023-08-17: qty 0.15

## 2023-08-17 MED ORDER — ALBUTEROL SULFATE (2.5 MG/3ML) 0.083% IN NEBU: 2.5000 mg | INHALATION_SOLUTION | RESPIRATORY_TRACT | Status: DC | PRN

## 2023-08-17 MED ORDER — PANTOPRAZOLE SODIUM 40 MG IV SOLR
40.0000 mg | Freq: Every day | INTRAVENOUS | Status: DC
  Administered 2023-08-17: 40 mg via INTRAVENOUS
  Filled 2023-08-17: qty 10

## 2023-08-17 MED ORDER — MAGNESIUM SULFATE 2 GM/50ML IV SOLN
2.0000 g | Freq: Once | INTRAVENOUS | Status: AC
  Administered 2023-08-17: 2 g via INTRAVENOUS
  Filled 2023-08-17: qty 50

## 2023-08-17 MED ORDER — SODIUM CHLORIDE 0.9 % IV BOLUS
1000.0000 mL | Freq: Once | INTRAVENOUS | Status: AC
  Administered 2023-08-17: 1000 mL via INTRAVENOUS

## 2023-08-17 MED ORDER — IBUPROFEN 200 MG PO TABS: 400.0000 mg | ORAL_TABLET | Freq: Four times a day (QID) | ORAL | Status: DC | PRN

## 2023-08-17 MED ORDER — TRAZODONE HCL 50 MG PO TABS
25.0000 mg | ORAL_TABLET | Freq: Every evening | ORAL | Status: DC | PRN
  Administered 2023-08-18: 25 mg via ORAL
  Filled 2023-08-17: qty 1

## 2023-08-17 MED ORDER — SUCRALFATE 1 GM/10ML PO SUSP
1.0000 g | Freq: Three times a day (TID) | ORAL | Status: DC
  Administered 2023-08-18 – 2023-08-20 (×6): 1 g via ORAL
  Filled 2023-08-17 (×8): qty 10

## 2023-08-17 MED ORDER — AMLODIPINE BESYLATE 10 MG PO TABS
10.0000 mg | ORAL_TABLET | Freq: Every day | ORAL | Status: DC
  Administered 2023-08-17 – 2023-08-20 (×3): 10 mg via ORAL
  Filled 2023-08-17 (×3): qty 1

## 2023-08-17 MED ORDER — INSULIN ASPART 100 UNIT/ML IJ SOLN
0.0000 [IU] | Freq: Every day | INTRAMUSCULAR | Status: DC
  Filled 2023-08-17: qty 0.05

## 2023-08-17 MED ORDER — HYDRALAZINE HCL 20 MG/ML IJ SOLN: 5.0000 mg | Freq: Four times a day (QID) | INTRAMUSCULAR | Status: DC | PRN

## 2023-08-17 MED ORDER — ONDANSETRON HCL 4 MG PO TABS
4.0000 mg | ORAL_TABLET | Freq: Four times a day (QID) | ORAL | Status: DC | PRN
  Administered 2023-08-17: 4 mg via ORAL
  Filled 2023-08-17: qty 1

## 2023-08-17 MED ORDER — SODIUM CHLORIDE 0.9 % IV SOLN: INTRAVENOUS | Status: AC

## 2023-08-17 MED ORDER — ENOXAPARIN SODIUM 40 MG/0.4ML IJ SOSY
40.0000 mg | PREFILLED_SYRINGE | INTRAMUSCULAR | Status: DC
  Administered 2023-08-17 – 2023-08-20 (×4): 40 mg via SUBCUTANEOUS
  Filled 2023-08-17 (×4): qty 0.4

## 2023-08-17 MED ORDER — ONDANSETRON HCL 4 MG/2ML IJ SOLN
4.0000 mg | Freq: Once | INTRAMUSCULAR | Status: AC
  Administered 2023-08-17: 4 mg via INTRAVENOUS
  Filled 2023-08-17: qty 2

## 2023-08-17 MED ORDER — ONDANSETRON HCL 4 MG/2ML IJ SOLN: 4.0000 mg | Freq: Four times a day (QID) | INTRAMUSCULAR | Status: DC | PRN

## 2023-08-17 NOTE — ED Notes (Addendum)
ED TO INPATIENT HANDOFF REPORT  Name/Age/Gender Richard Lopez 47 y.o. male  Code Status    Code Status Orders  (From admission, onward)           Start     Ordered   08/17/23 0747  Full code  Continuous       Question:  By:  Answer:  Consent: discussion documented in EHR   08/17/23 0747           Code Status History     Date Active Date Inactive Code Status Order ID Comments User Context   04/20/2023 1312 04/23/2023 1933 Full Code [id number]  Bobette Mo, MD ED   11/08/2022 1430 11/17/2022 1813 Full Code [id number]  Teddy Spike, DO ED   05/08/2021 1915 05/10/2021 1738 Full Code [id number]  Teddy Spike, DO ED   04/27/2021 1649 04/29/2021 1824 Full Code [id number]  Teddy Spike, DO Inpatient   02/12/2021 1722 02/14/2021 1826 Full Code [id number]  Jae Dire, MD Inpatient   10/22/2020 1521 10/24/2020 1755 Full Code [id number]  Jae Dire, MD Inpatient   06/18/2020 2133 06/21/2020 1709 Full Code [id number]  Marinda Elk, MD ED   04/17/2020 0754 04/23/2020 2030 Full Code [id number]  Coralyn Helling, MD ED   08/13/2019 0844 08/18/2019 1709 Full Code [id number]  Rolly Salter, MD ED   08/02/2019 0007 08/05/2019 1524 Full Code [id number]  Rolly Salter, MD ED   05/17/2019 0009 05/19/2019 1953 Full Code [id number]  Charlsie Quest, MD ED   04/07/2019 1152 04/15/2019 1743 Full Code [id number]  Jonah Blue, MD ED   03/25/2019 2153 04/01/2019 1705 Full Code [id number]  Briscoe Deutscher, MD ED   11/07/2018 0738 11/10/2018 2123 Full Code [id number]  Samson Frederic, MD Inpatient   03/31/2018 1734 04/03/2018 2223 Full Code [id number]  Narda Bonds, MD ED   04/01/2017 0902 04/02/2017 2121 Full Code [id number]  Renae Fickle, MD Inpatient   07/29/2014 2034 07/31/2014 1601 Full Code [id number]  Eduard Clos, MD Inpatient       Home/SNF/Other Home  Chief Complaint Dehydration with hyponatremia [E86.0, E87.1]  Level of Care/Admitting Diagnosis ED Disposition     ED Disposition   Admit   Condition  --   Comment  [facility]ospital [facility] [[id number]]  Level of Care: Progressive [102]  Admit to Progressive based on following criteria: MULTISYSTEM THREATS such as stable sepsis, metabolic/electrolyte imbalance with or without encephalopathy that is responding to early treatment.  May admit patient to [facility] or [facility] if equivalent level of care is available:: Yes  Covid Evaluation: Asymptomatic - no recent exposure (last 10 days) testing not required  Diagnosis: Dehydration with hyponatremia [967893]  Admitting Physician: Maryln Gottron [[id number]]  Attending Physician: Kirby Crigler, Parks Neptune [[id number]]  Certification:: I certify this patient will need inpatient services for at least 2 midnights  Expected Medical Readiness: 08/19/2023          Medical History Past Medical History:  Diagnosis Date   Anxiety    Arthritis    hands and possibly knee   Depression    Diabetes mellitus    diet controlled   Essential hypertension    Gastritis    Gout    Normocytic anemia due to blood loss 08/21/2019   Pulmonary embolism (HCC)     Allergies No Known Allergies  IV Location/Drains/Wounds Patient Lines/Drains/Airways Status     Active Line/Drains/Airways     Name Placement  date Placement time Site Days   Peripheral IV 08/17/23 20 G Anterior;Distal;Right;Upper Arm 08/17/23  0418  Arm  less than 1            Labs/Imaging Results for orders placed or performed during the hospital encounter of 08/17/23 (from the past 48 hour(s))  CBC with Differential     Status: Abnormal   Collection Time: 08/17/23  4:00 AM  Result Value Ref Range   WBC 6.7 4.0 - 10.5 K/uL   RBC 4.42 4.22 - 5.81 MIL/uL   Hemoglobin 13.6 13.0 - 17.0 g/dL   HCT 28.4 (L) 13.2 - 44.0 %   MCV 86.9 80.0 - 100.0 fL   MCH 30.8 26.0 - 34.0 pg   MCHC 35.4 30.0 - 36.0 g/dL   RDW 10.2 72.5 - 36.6 %   Platelets 141 (L) 150 - 400 K/uL   nRBC 0.0 0.0 - 0.2 %    Neutrophils Relative % 45 %   Neutro Abs 3.0 1.7 - 7.7 K/uL   Lymphocytes Relative 38 %   Lymphs Abs 2.5 0.7 - 4.0 K/uL   Monocytes Relative 15 %   Monocytes Absolute 1.0 0.1 - 1.0 K/uL   Eosinophils Relative 1 %   Eosinophils Absolute 0.1 0.0 - 0.5 K/uL   Basophils Relative 1 %   Basophils Absolute 0.1 0.0 - 0.1 K/uL   Immature Granulocytes 0 %   Abs Immature Granulocytes 0.02 0.00 - 0.07 K/uL    Comment: Performed at [facility], [address], [place], [place] [address]  Blood gas, venous     Status: Abnormal   Collection Time: 08/17/23  4:13 AM  Result Value Ref Range   pH, Ven 7.4 7.25 - 7.43   pCO2, Ven 23 (L) 44 - 60 mmHg   pO2, Ven <31 (LL) 32 - 45 mmHg    Comment: CRITICAL RESULT CALLED TO, READ BACK BY AND VERIFIED WITH: ABREU B. @0427  08/17/2023 MCLEAN K.    Bicarbonate 14.2 (L) 20.0 - 28.0 mmol/L   Acid-base deficit 8.6 (H) 0.0 - 2.0 mmol/L   O2 Saturation 47.4 %   Patient temperature 37.0     Comment: Performed at [facility], [address], [place], [place] [address]  POC CBG, ED     Status: Abnormal   Collection Time: 08/17/23  4:15 AM  Result Value Ref Range   Glucose-Capillary 158 (H) 70 - 99 mg/dL    Comment: Glucose reference range applies only to samples taken after fasting for at least 8 hours.  Comprehensive metabolic panel     Status: Abnormal   Collection Time: 08/17/23  5:42 AM  Result Value Ref Range   Sodium 131 (L) 135 - 145 mmol/L    Comment: ELECTROLYTES REPEATED TO VERIFY   Potassium 3.5 3.5 - 5.1 mmol/L   Chloride 92 (L) 98 - 111 mmol/L    Comment: ELECTROLYTES REPEATED TO VERIFY   CO2 16 (L) 22 - 32 mmol/L    Comment: ELECTROLYTES REPEATED TO VERIFY   Glucose, Bld 123 (H) 70 - 99 mg/dL    Comment: Glucose reference range applies only to samples taken after fasting for at least 8 hours.   BUN 20 6 - 20 mg/dL   Creatinine, Ser 5.63 (H) 0.61 - 1.24 mg/dL   Calcium 9.3 8.9 - 87.5 mg/dL   Total  Protein 8.5 (H) 6.5 - 8.1 g/dL   Albumin 4.9 3.5 - 5.0 g/dL   AST 643 (H) 15 -  41 U/L   ALT 101 (H) 0 - 44 U/L   Alkaline Phosphatase 79 38 - 126 U/L   Total Bilirubin 1.7 (H) 0.3 - 1.2 mg/dL   GFR, Estimated 37 (L) >60 mL/min    Comment: (NOTE) Calculated using the CKD-EPI Creatinine Equation (2021)    Anion gap >20 (H) 5 - 15    Comment: ELECTROLYTES REPEATED TO VERIFY Performed at [facility], [address], [place], [place] [address]   Lipase, blood     Status: Abnormal   Collection Time: 08/17/23  5:42 AM  Result Value Ref Range   Lipase 72 (H) 11 - 51 U/L    Comment: Performed at [facility], [address] [place], [place] [address]  Magnesium     Status: Abnormal   Collection Time: 08/17/23  5:42 AM  Result Value Ref Range   Magnesium 1.4 (L) 1.7 - 2.4 mg/dL    Comment: Performed at [facility], [address], [place], [place] [address]  Acetaminophen level     Status: Abnormal   Collection Time: 08/17/23  7:25 AM  Result Value Ref Range   Acetaminophen (Tylenol), Serum <10 (L) 10 - 30 ug/mL    Comment: (NOTE) Therapeutic concentrations vary significantly. A range of 10-30 ug/mL  may be an effective concentration for many patients. However, some  are best treated at concentrations outside of this range. Acetaminophen concentrations >150 ug/mL at 4 hours after ingestion  and >50 ug/mL at 12 hours after ingestion are often associated with  toxic reactions.  Performed at [facility], [address] [place], [place] [address]   I-Stat Lactic Acid     Status: Abnormal   Collection Time: 08/17/23  7:32 AM  Result Value Ref Range   Lactic Acid, Venous 2.0 (HH) 0.5 - 1.9 mmol/L   No results found.  Pending Labs Unresulted Labs (From admission, onward)     Start     Ordered   08/18/23 0500  Basic metabolic panel  Tomorrow morning,   R        08/17/23 0747   08/18/23 0500  CBC   Tomorrow morning,   R        08/17/23 0747   08/17/23 1700  Basic metabolic panel  Once,   R        08/17/23 0747   08/17/23 0747  Hemoglobin A1c  (Glycemic Control (SSI)  Q 4 Hours / Glycemic Control (SSI)  AC +/- HS)  Once,   R       Comments: To assess prior glycemic control    08/17/23 0747   08/17/23 0732  Hepatitis panel, acute  Once,   URGENT        08/17/23 0734   08/17/23 0704  Beta-hydroxybutyric acid  Once,   URGENT        08/17/23 0704   08/17/23 0406  Urinalysis, Routine w reflex microscopic -Urine, Clean Catch  Once,   URGENT       Question:  Specimen Source  Answer:  Urine, Clean Catch   08/17/23 0406            Vitals/Pain Today's Vitals   08/17/23 0645 08/17/23 0715 08/17/23 0730 08/17/23 0756  BP: (!) 174/119 (!) 176/108 (!) 160/108   Pulse: 88 84 88   Resp: 18 20 16    Temp:    98.1 F (36.7 C)  TempSrc:    Oral  SpO2: 100% 100% 100%  PainSc:        Isolation Precautions No active isolations  Medications Medications  magnesium sulfate IVPB 2 g 50 mL (2 g Intravenous New Bag/Given 08/17/23 0800)  pantoprazole (PROTONIX) injection 40 mg (40 mg Intravenous Given 08/17/23 0754)  0.9 %  sodium chloride infusion (has no administration in time range)  amLODipine (NORVASC) tablet 10 mg (has no administration in time range)  enoxaparin (LOVENOX) injection 40 mg (has no administration in time range)  insulin aspart (novoLOG) injection 0-15 Units (has no administration in time range)  insulin aspart (novoLOG) injection 0-5 Units (has no administration in time range)  ibuprofen (ADVIL) tablet 400 mg (has no administration in time range)  traZODone (DESYREL) tablet 25 mg (has no administration in time range)  ondansetron (ZOFRAN) tablet 4 mg (has no administration in time range)    Or  ondansetron (ZOFRAN) injection 4 mg (has no administration in time range)  albuterol (PROVENTIL) (2.5 MG/3ML) 0.083% nebulizer solution 2.5 mg (has no administration in time  range)  hydrALAZINE (APRESOLINE) injection 5 mg (has no administration in time range)  sucralfate (CARAFATE) 1 GM/10ML suspension 1 g (has no administration in time range)  sodium chloride 0.9 % bolus 1,000 mL (0 mLs Intravenous Stopped 08/17/23 0536)  ondansetron (ZOFRAN) injection 4 mg (4 mg Intravenous Given 08/17/23 0419)  sodium chloride 0.9 % bolus 1,000 mL (1,000 mLs Intravenous New Bag/Given 08/17/23 0754)    Mobility walks

## 2023-08-17 NOTE — ED Triage Notes (Signed)
Pt arrives via POV c/o severe abdominal pain ongoing since Thursday worsening last night. Pt increasingly weak. Requiring assistance getting out of the car and from wheelchair to stretcher. Pt diaphoretic and vomiting while in triage.

## 2023-08-17 NOTE — ED Provider Notes (Signed)
[place] EMERGENCY DEPARTMENT AT [facility] Provider Note   CSN: [id number] Arrival date & time: 08/17/23  0347     History  Chief Complaint  Patient presents with   Abdominal Pain   Weakness    Richard Lopez is a 47 y.o. male.  The history is provided by the patient.  Abdominal Pain Weakness Associated symptoms: abdominal pain   He has history of hypertension, diabetes (diet-controlled) gastritis, gout, pulmonary embolism not on anticoagulation and comes in because of vomiting over the last 4 days.  He is also complaining of some vague abdominal pain.  He is worried that he is getting dehydrated.  He is complaining of his arms and legs cramping and feels like he is going to pass out.  He denies fever or chills but has had sweats.   Home Medications Prior to Admission medications   Medication Sig Start Date End Date Taking? Authorizing Provider  acetaminophen (TYLENOL) 500 MG tablet Take 1,000 mg by mouth daily as needed for mild pain.    [provider]  amLODipine (NORVASC) 10 MG tablet Take 1 tablet (10 mg total) by mouth daily. 04/23/23 07/22/23  Lorin Glass, MD  diclofenac Sodium (VOLTAREN) 1 % GEL Apply 1 Application topically as needed (pain).    [provider]  feeding supplement (ENSURE ENLIVE / ENSURE PLUS) LIQD Take 237 mLs by mouth 3 (three) times daily between meals. Patient taking differently: Take 237 mLs by mouth daily. 11/15/22   Marguerita Merles Latif, DO  Multiple Vitamin (MULTIVITAMIN WITH MINERALS) TABS tablet Take 1 tablet by mouth daily. 11/16/22   Marguerita Merles Latif, DO  pantoprazole (PROTONIX) 40 MG tablet Take 1 tablet (40 mg total) by mouth daily for 28 days. 04/23/23 05/21/23  Lorin Glass, MD  potassium chloride SA (KLOR-CON M) 20 MEQ tablet Take 2 tablets (40 mEq total) by mouth daily for 3 days. Patient not taking: Reported on 05/13/2023 04/23/23 04/26/23  Lorin Glass, MD  predniSONE (STERAPRED UNI-PAK 21 TAB) 10 MG (21)  TBPK tablet Prednisone dose pack directions Day 1 take 6 tablets Day 2 take 5 tablets Day 3 take 4 tablets Day 4 take 3 tablets Day 3 take 2 tablets Day 4 take 1 tablet Dispense 21 tablets 05/21/23   [name], NP      Allergies    Patient has no known allergies.    Review of Systems   Review of Systems  Gastrointestinal:  Positive for abdominal pain.  Neurological:  Positive for weakness.  All other systems reviewed and are negative.   Physical Exam Updated Vital Signs BP (!) 164/102 (BP Location: Right Arm)   Pulse (!) 122   Temp 98.1 F (36.7 C) (Oral)   Resp (!) 24   SpO2 100%  Physical Exam Vitals and nursing note reviewed.   47 year old male, hyperventilating and appears uncomfortable. Vital signs are significant for elevated heart rate, respiratory rate, blood pressure. Oxygen saturation is 100%, which is normal. Head is normocephalic and atraumatic. PERRLA, EOMI. Oropharynx is clear. Neck is nontender and supple. Lungs are clear without rales, wheezes, or rhonchi. Chest is nontender. Heart has regular rate and rhythm without murmur. Abdomen is soft, flat, with minimal tenderness to deep palpation in the left mid abdomen. Extremities have no cyanosis or edema, full range of motion is present. Skin is mildly diaphoretic. Neurologic: Anxious and hyperventilating, cranial nerves are intact, moves all extremities equally.  ED Results / Procedures / Treatments  Labs (all labs ordered are listed, but only abnormal results are displayed) Labs Reviewed  BLOOD GAS, VENOUS  COMPREHENSIVE METABOLIC PANEL  LIPASE, BLOOD  CBC WITH DIFFERENTIAL/PLATELET  URINALYSIS, ROUTINE W REFLEX MICROSCOPIC  MAGNESIUM  CBG MONITORING, ED    EKG EKG Interpretation Date/Time:  Monday August 17 2023 03:58:37 EDT Ventricular Rate:  120 PR Interval:  138 QRS Duration:  82 QT Interval:  311 QTC Calculation: 440 R Axis:   57  Text Interpretation: Fast sinus arrhythmia  Aberrant conduction of SV complex(es) Minimal ST depression, diffuse leads When compared with ECG of 04/20/2023, HEART RATE has increased Premature atrial complexes are now present Reconfirmed by Dione Booze ([id number]) on 08/17/2023 4:24:36 AM  Radiology No results found.  Procedures Procedures  Cardiac monitor shows sinus tachycardia, per my interpretation.  Medications Ordered in ED Medications  sodium chloride 0.9 % bolus 1,000 mL (1,000 mLs Intravenous New Bag/Given 08/17/23 0419)  ondansetron (ZOFRAN) injection 4 mg (4 mg Intravenous Given 08/17/23 0419)    ED Course/ Medical Decision Making/ A&P                                 Medical Decision Making Amount and/or Complexity of Data Reviewed Labs: ordered.  Risk Prescription drug management. Decision regarding hospitalization.   Nausea and vomiting.  Hyperventilating, concern for possible Kussmaul respirations and diabetic ketoacidosis.  Doubt bowel obstruction.  Possible viral gastritis.  Vague abdominal pain with benign exam.  I have reviewed his past records, and he was admitted to the hospital 05/08/2021-05/10/2021 for intractable vomiting with acute kidney injury.  Also similar hospital admissions on 04/27/2021, 02/12/2021, 10/22/2020, 06/18/2020.  I have ordered IV fluids, ondansetron for nausea.  I have reviewed his electrocardiogram and my interpretation is sinus tachycardia with PACs.  I have ordered capillary blood glucose be checked.  I have ordered laboratory testing of CBC, comprehensive metabolic panel, lipase, venous blood gas.  I have reviewed his laboratory test, and my interpretation is acute kidney injury with creatinine 2.18 compared with 1.01 on 7/10, metabolic acidosis with increased anion gap of 23, mild hyponatremia which is not felt to be clinically significant, mild elevation of lipase not felt to be clinically significant, moderate elevation of transaminases which is new compared with 04/21/2023, hypomagnesemia and  they have ordered a dose of intravenous magnesium, elevated total bilirubin which is new compared with 04/21/2023, mild thrombocytopenia improved compared with 05/13/2023.  I have reevaluated the patient and he is no longer hyperventilating and states that he is feeling somewhat better.  I have ordered additional IV fluids.  Because of elevated anion gap, I have ordered a single lactic acid level and beta hydroxybutyrate level.  It is unclear why he has these recurrent episodes of dehydration and acute kidney injury.  I have discussed the case with Dr. Erenest Blank of [facility], who agrees to admit the patient.  Final Clinical Impression(s) / ED Diagnoses Final diagnoses:  Acute kidney injury (nontraumatic) (HCC)  Metabolic acidosis  Elevated transaminase level  Serum total bilirubin elevated  Elevated lipase  Hypomagnesemia    Rx / DC Orders ED Discharge Orders     None         Dione Booze, MD 08/17/23 [phone]

## 2023-08-17 NOTE — ED Notes (Signed)
Called Lab for status of CMP and lipase. Lab stated they had called previously for a recollect of lab. No one had informed writer that lab had called. Lab was recollected and sent

## 2023-08-17 NOTE — H&P (Addendum)
History and Physical  DAMARIE BREIG FTD:[id number] DOB: 1976/03/05 DOA: 08/17/2023  PCP: [name], NP   Chief Complaint: Abdominal pain, vomiting  HPI: Richard Lopez is a 47 y.o. male with medical history significant for prior PE treated, hypertension, severe esophagitis, diet-controlled diabetes, depression and anxiety being admitted to the hospital with abdominal pain, vomiting, acute renal failure and abnormal LFTs.  He has a history of severe esophagitis found on upper endoscopy January 2024, as well as PE after surgery in 2020, after which coagulation.  He completed a course of says that he takes Protonix for his gastritis.  He was in his usual state of health until the last couple of weeks, when he had a very rough time, with multiple family members as well as a couple of friends passing away.  He became incredibly depressed and anxious, he describes how over the last couple weeks he has barely eaten.  In the last 3 to 4 days, he has had severe worsening epigastric abdominal pain and nausea, which are reminiscent of his gastritis pain.  In the last 24 hours, he has also had a lot of nausea and vomiting, now his abdomen is diffusely tender, feels like a cramping pain.  He denies any fevers or chills, he has been urinating but not very much.  Denies any seeing blood in his urine, any blood in his emesis, or any coffee grounds in his emesis.  Denies any chest pain.  Of note, he was hospitalized in June of this year with acute renal failure, hyponatremia, hypokalemia and this all resolved with rehydration.  This morning in the emergency department he has been afebrile, tachycardic and hypertensive.  He is still actively vomiting.  Lab work shows unremarkable CBC, CMP with sodium 131, CO2 16, glucose 123, creatinine 2.18 up from baseline of 1.0, magnesium 1.4, lipase 72, AST 183, ALT 101, total bilirubin 1.7, lactic acid 2.0.  He has been given 2 L normal saline bolus and hospitalist was  contacted for admission.  Review of Systems: Please see HPI for pertinent positives and negatives. A complete 10 system review of systems are otherwise negative.  Past Medical History:  Diagnosis Date   Anxiety    Arthritis    hands and possibly knee   Depression    Diabetes mellitus    diet controlled   Essential hypertension    Gastritis    Gout    Normocytic anemia due to blood loss 08/21/2019   Pulmonary embolism [facility])    Past Surgical History:  Procedure Laterality Date   BIOPSY  11/12/2022   Procedure: BIOPSY;  Surgeon: Kerin Salen, MD;  Location: [facility];  Service: Gastroenterology;;   ESOPHAGOGASTRODUODENOSCOPY (EGD) WITH PROPOFOL N/A 08/14/2019   Procedure: ESOPHAGOGASTRODUODENOSCOPY (EGD) WITH PROPOFOL;  Surgeon: Charlott Rakes, MD;  Location: [facility];  Service: Endoscopy;  Laterality: N/A;   ESOPHAGOGASTRODUODENOSCOPY (EGD) WITH PROPOFOL N/A 11/12/2022   Procedure: ESOPHAGOGASTRODUODENOSCOPY (EGD) WITH PROPOFOL;  Surgeon: Kerin Salen, MD;  Location: [facility];  Service: Gastroenterology;  Laterality: N/A;   EXTERNAL FIXATION LEG Left 11/07/2018   Procedure: EXTERNAL FIXATION LEFT LOWER LEG;  Surgeon: Samson Frederic, MD;  Location: [facility] ORS;  Service: Orthopedics;  Laterality: Left;   EXTERNAL FIXATION REMOVAL Left 11/08/2018   Procedure: REMOVAL EXTERNAL FIXATION LEG;  Surgeon: Roby Lofts, MD;  Location: [facility] OR;  Service: Orthopedics;  Laterality: Left;   INTRAMEDULLARY (IM) NAIL INTERTROCHANTERIC Right 03/26/2019   Procedure: INTRAMEDULLARY (IM) NAIL INTERTROCHANTRIC;  Surgeon: Roby Lofts,  MD;  Location: [facility] OR;  Service: Orthopedics;  Laterality: Right;   INTRAMEDULLARY (IM) NAIL INTERTROCHANTERIC Right 05/17/2019   Procedure: Intramedullary (Im) Nail Intertroch with circlage wiring;  Surgeon: Myrene Galas, MD;  Location: [facility] OR;  Service: Orthopedics;  Laterality: Right;   NO PAST SURGERIES     OPEN REDUCTION INTERNAL FIXATION (ORIF) TIBIA/FIBULA  FRACTURE Left 11/08/2018   Procedure: OPEN REDUCTION INTERNAL FIXATION (ORIF) TIBIA/FIBULA FRACTURE;  Surgeon: Roby Lofts, MD;  Location: [facility] OR;  Service: Orthopedics;  Laterality: Left;   ORIF FEMUR FRACTURE Right 05/17/2019   Procedure: REMOVAL  OF HARDWARE;  Surgeon: Myrene Galas, MD;  Location: [facility] OR;  Service: Orthopedics;  Laterality: Right;    Social History:  reports that he has quit smoking. His smoking use included cigarettes. He has never used smokeless tobacco. He reports current alcohol use of about 200.0 standard drinks of alcohol per week. He reports current drug use. Drug: Marijuana.   No Known Allergies  Family History  Problem Relation Age of Onset   Diabetes Mellitus II Father    Diabetes Mellitus II Other    CAD Other      Prior to Admission medications   Medication Sig Start Date End Date Taking? Authorizing Provider  acetaminophen (TYLENOL) 500 MG tablet Take 1,000 mg by mouth daily as needed for mild pain.    [provider]  amLODipine (NORVASC) 10 MG tablet Take 1 tablet (10 mg total) by mouth daily. 04/23/23 07/22/23  Lorin Glass, MD  diclofenac Sodium (VOLTAREN) 1 % GEL Apply 1 Application topically as needed (pain).    [provider]  feeding supplement (ENSURE ENLIVE / ENSURE PLUS) LIQD Take 237 mLs by mouth 3 (three) times daily between meals. Patient taking differently: Take 237 mLs by mouth daily. 11/15/22   Marguerita Merles Latif, DO  Multiple Vitamin (MULTIVITAMIN WITH MINERALS) TABS tablet Take 1 tablet by mouth daily. 11/16/22   Marguerita Merles Latif, DO  pantoprazole (PROTONIX) 40 MG tablet Take 1 tablet (40 mg total) by mouth daily for 28 days. 04/23/23 05/21/23  Lorin Glass, MD  potassium chloride SA (KLOR-CON M) 20 MEQ tablet Take 2 tablets (40 mEq total) by mouth daily for 3 days. Patient not taking: Reported on 05/13/2023 04/23/23 04/26/23  Lorin Glass, MD  predniSONE (STERAPRED UNI-PAK 21 TAB) 10 MG (21) TBPK tablet Prednisone  dose pack directions Day 1 take 6 tablets Day 2 take 5 tablets Day 3 take 4 tablets Day 4 take 3 tablets Day 3 take 2 tablets Day 4 take 1 tablet Dispense 21 tablets 05/21/23   [name], NP    Physical Exam: BP (!) 174/119   Pulse 88   Temp 98.1 F (36.7 C) (Oral)   Resp 18   SpO2 100%   General:  Alert, oriented, calm, good historian, pleasant and cooperative, looks nontoxic and not in acute distress, though he looks uncomfortable from nausea.  Overall he does look quite dehydrated. Cardiovascular: RRR, no murmurs or rubs, no peripheral edema  Respiratory: clear to auscultation bilaterally, no wheezes, no crackles  Abdomen: soft, voluntary guarding, no focal tenderness, nondistended, normal bowel tones heard  Skin: dry, no rashes  Musculoskeletal: no joint effusions, normal range of motion  Psychiatric: appropriate affect, normal speech  Neurologic: extraocular muscles intact, clear speech, moving all extremities with intact sensorium         Labs on Admission:  Basic Metabolic Panel: Recent Labs  Lab 08/17/23 0542  NA 131*  K 3.5  CL 92*  CO2 16*  GLUCOSE 123*  BUN 20  CREATININE 2.18*  CALCIUM 9.3  MG 1.4*   Liver Function Tests: Recent Labs  Lab 08/17/23 0542  AST 183*  ALT 101*  ALKPHOS 79  BILITOT 1.7*  PROT 8.5*  ALBUMIN 4.9   Recent Labs  Lab 08/17/23 0542  LIPASE 72*   No results for input(s): "AMMONIA" in the last 168 hours. CBC: Recent Labs  Lab 08/17/23 0400  WBC 6.7  NEUTROABS 3.0  HGB 13.6  HCT 38.4*  MCV 86.9  PLT 141*   Cardiac Enzymes: No results for input(s): "CKTOTAL", "CKMB", "CKMBINDEX", "TROPONINI" in the last 168 hours.  BNP (last 3 results) No results for input(s): "BNP" in the last 8760 hours.  ProBNP (last 3 results) No results for input(s): "PROBNP" in the last 8760 hours.  CBG: Recent Labs  Lab 08/17/23 0415  GLUCAP 158*    Radiological Exams on Admission: No results found.  Assessment/Plan MAXIMILLION AVENI is a 47 y.o. male with medical history significant for prior PE treated, hypertension, severe esophagitis, diet-controlled diabetes, depression and anxiety being admitted to the hospital with abdominal pain, vomiting, acute renal failure and abnormal LFTs.  Acute renal failure-I suspect this is all prerenal from significant dehydration from his recent lack of oral intake, and vomiting due to his gastritis/esophagitis.  Baseline renal function is normal. -Inpatient admission -Restive hydration with a total 2 L normal saline bolus, and then another 1 L normal saline over the next 10 hours. -After this, continue to encourage oral fluid intake -In the meantime we will avoid nephrotoxins, and renally dose medications -Renal obstructive uropathy is not highly suspected, can obtain renal ultrasound if kidney function not improving in the next 24 hours  Dehydration-due to lack of oral intake and vomiting as above.  Abnormal LFTs-unclear etiology, could be due to degree of dehydration and vomiting -Check Tylenol level -Check acute hepatitis panel -Check right upper quadrant ultrasound  Lactic acidosis-patient is not meeting sepsis criteria, no signs or symptoms of acute infection.  I suspect his lactic acidosis is due to dehydration and vomiting. -Treat dehydration as above, trend lactate  History severe erosive esophagitis and esophageal ulcers-seen on upper endoscopy January 2024.  After that hospital stay, he was discharged on twice daily PPI as well as sucralfate. -IV PPI -Sucralfate 3 times daily with meals and at bedtime -If severe symptoms persist, may consider repeat endoscopy with GI  Anion gap metabolic acidosis-I suspect this is due to dehydration and GI losses from vomiting -Hydrate as above -Will recheck a BMP this afternoon  Hyponatremia-with hypochloremia, suspect this is hypovolemic hyponatremia due to clinical dehydration as above -Will recheck a BMP this  afternoon  Hypertension-currently uncontrolled with tachycardia and hypertension due to his discomfort and active vomiting -Will continue home medications once reconciled -IV hydralazine as needed for SBP greater than 160  Well-controlled type 2 diabetes-diet controlled, last hemoglobin A1c 6 earlier this year. -Check hemoglobin A1c -Carb controlled diet -Moderate dose sliding scale  Depression and anxiety-with recent life stressors including death of multiple family members and friends.  Patient states he does have a therapist, but is interested in finding a different therapist to may be more effective for him. -TOC consult  DVT prophylaxis: Lovenox     Code Status: Full Code  Consults called: None  Admission status: The appropriate patient status for this patient is INPATIENT. Inpatient status is judged to be reasonable and necessary in order to provide  the required intensity of service to ensure the patient's safety. The patient's presenting symptoms, physical exam findings, and initial radiographic and laboratory data in the context of their chronic comorbidities is felt to place them at high risk for further clinical deterioration. Furthermore, it is not anticipated that the patient will be medically stable for discharge from the hospital within 2 midnights of admission.    I certify that at the point of admission it is my clinical judgment that the patient will require inpatient hospital care spanning beyond 2 midnights from the point of admission due to high intensity of service, high risk for further deterioration and high frequency of surveillance required  Time spent: 59 minutes  Kieana Livesay Sharlette Dense MD [facility] Pager [phone]  If 7PM-7AM, please contact night-coverage [email or web address] Password [facility]  08/17/2023, 7:47 AM

## 2023-08-18 ENCOUNTER — Encounter (HOSPITAL_COMMUNITY): Payer: Self-pay | Admitting: Internal Medicine

## 2023-08-18 ENCOUNTER — Ambulatory Visit (INDEPENDENT_AMBULATORY_CARE_PROVIDER_SITE_OTHER): Payer: Medicaid Other | Admitting: Primary Care

## 2023-08-18 ENCOUNTER — Inpatient Hospital Stay (HOSPITAL_COMMUNITY): Payer: Medicaid Other

## 2023-08-18 DIAGNOSIS — R7401 Elevation of levels of liver transaminase levels: Secondary | ICD-10-CM | POA: Diagnosis not present

## 2023-08-18 DIAGNOSIS — R748 Abnormal levels of other serum enzymes: Secondary | ICD-10-CM | POA: Diagnosis not present

## 2023-08-18 DIAGNOSIS — R17 Unspecified jaundice: Secondary | ICD-10-CM

## 2023-08-18 DIAGNOSIS — N179 Acute kidney failure, unspecified: Secondary | ICD-10-CM | POA: Diagnosis not present

## 2023-08-18 DIAGNOSIS — K8020 Calculus of gallbladder without cholecystitis without obstruction: Secondary | ICD-10-CM | POA: Diagnosis present

## 2023-08-18 DIAGNOSIS — E872 Acidosis, unspecified: Secondary | ICD-10-CM

## 2023-08-18 DIAGNOSIS — R109 Unspecified abdominal pain: Secondary | ICD-10-CM | POA: Diagnosis present

## 2023-08-18 DIAGNOSIS — K209 Esophagitis, unspecified without bleeding: Secondary | ICD-10-CM | POA: Diagnosis present

## 2023-08-18 LAB — CBC
HCT: 33.2 % — ABNORMAL LOW (ref 39.0–52.0)
Hemoglobin: 11.5 g/dL — ABNORMAL LOW (ref 13.0–17.0)
MCH: 30.5 pg (ref 26.0–34.0)
MCHC: 34.6 g/dL (ref 30.0–36.0)
MCV: 88.1 fL (ref 80.0–100.0)
Platelets: 123 10*3/uL — ABNORMAL LOW (ref 150–400)
RBC: 3.77 MIL/uL — ABNORMAL LOW (ref 4.22–5.81)
RDW: 14.5 % (ref 11.5–15.5)
WBC: 4.6 10*3/uL (ref 4.0–10.5)
nRBC: 0 % (ref 0.0–0.2)

## 2023-08-18 LAB — BASIC METABOLIC PANEL
Anion gap: 11 (ref 5–15)
BUN: 17 mg/dL (ref 6–20)
CO2: 23 mmol/L (ref 22–32)
Calcium: 8.5 mg/dL — ABNORMAL LOW (ref 8.9–10.3)
Chloride: 95 mmol/L — ABNORMAL LOW (ref 98–111)
Creatinine, Ser: 1.07 mg/dL (ref 0.61–1.24)
GFR, Estimated: >60 mL/min (ref >60)
Glucose, Bld: 101 mg/dL — ABNORMAL HIGH (ref 70–99)
Potassium: 3.4 mmol/L — ABNORMAL LOW (ref 3.5–5.1)
Sodium: 129 mmol/L — ABNORMAL LOW (ref 135–145)

## 2023-08-18 LAB — OSMOLALITY: Osmolality: 286 mosm/kg (ref 275–295)

## 2023-08-18 LAB — HEPATIC FUNCTION PANEL
ALT: 76 U/L — ABNORMAL HIGH (ref 0–44)
AST: 108 U/L — ABNORMAL HIGH (ref 15–41)
Albumin: 3.8 g/dL (ref 3.5–5.0)
Alkaline Phosphatase: 61 U/L (ref 38–126)
Bilirubin, Direct: 0.3 mg/dL — ABNORMAL HIGH (ref 0.0–0.2)
Indirect Bilirubin: 1.6 mg/dL — ABNORMAL HIGH (ref 0.3–0.9)
Total Bilirubin: 1.9 mg/dL — ABNORMAL HIGH (ref 0.3–1.2)
Total Protein: 7.5 g/dL (ref 6.5–8.1)

## 2023-08-18 LAB — GLUCOSE, CAPILLARY
Glucose-Capillary: 122 mg/dL — ABNORMAL HIGH (ref 70–99)
Glucose-Capillary: 136 mg/dL — ABNORMAL HIGH (ref 70–99)
Glucose-Capillary: 86 mg/dL (ref 70–99)
Glucose-Capillary: 89 mg/dL (ref 70–99)
Glucose-Capillary: 97 mg/dL (ref 70–99)

## 2023-08-18 LAB — LIPASE, BLOOD: Lipase: 60 U/L — ABNORMAL HIGH (ref 11–51)

## 2023-08-18 MED ORDER — TECHNETIUM TC 99M MEBROFENIN IV KIT
5.4000 | PACK | Freq: Once | INTRAVENOUS | Status: AC | PRN
  Administered 2023-08-18: 5.4 via INTRAVENOUS

## 2023-08-18 MED ORDER — PNEUMOCOCCAL 20-VAL CONJ VACC 0.5 ML IM SUSY: 0.5000 mL | PREFILLED_SYRINGE | INTRAMUSCULAR | Status: DC | PRN

## 2023-08-18 MED ORDER — LORAZEPAM 1 MG PO TABS
1.0000 mg | ORAL_TABLET | Freq: Four times a day (QID) | ORAL | Status: DC | PRN
  Administered 2023-08-18: 1 mg via ORAL
  Filled 2023-08-18: qty 1

## 2023-08-18 MED ORDER — SODIUM CHLORIDE 0.9 % IV SOLN: INTRAVENOUS | Status: DC

## 2023-08-18 MED ORDER — POTASSIUM CHLORIDE CRYS ER 20 MEQ PO TBCR
40.0000 meq | EXTENDED_RELEASE_TABLET | Freq: Once | ORAL | Status: AC
  Administered 2023-08-18: 40 meq via ORAL
  Filled 2023-08-18: qty 2

## 2023-08-18 MED ORDER — FOLIC ACID 1 MG PO TABS
1.0000 mg | ORAL_TABLET | Freq: Every day | ORAL | Status: DC
  Administered 2023-08-18 – 2023-08-20 (×2): 1 mg via ORAL
  Filled 2023-08-18 (×2): qty 1

## 2023-08-18 MED ORDER — THIAMINE MONONITRATE 100 MG PO TABS
100.0000 mg | ORAL_TABLET | Freq: Every day | ORAL | Status: DC
  Administered 2023-08-18 – 2023-08-20 (×2): 100 mg via ORAL
  Filled 2023-08-18 (×2): qty 1

## 2023-08-18 MED ORDER — PANTOPRAZOLE SODIUM 40 MG PO TBEC
40.0000 mg | DELAYED_RELEASE_TABLET | Freq: Every day | ORAL | Status: DC
  Administered 2023-08-18 – 2023-08-20 (×2): 40 mg via ORAL
  Filled 2023-08-18 (×2): qty 1

## 2023-08-18 MED ORDER — THIAMINE HCL 100 MG/ML IJ SOLN
100.0000 mg | Freq: Every day | INTRAMUSCULAR | Status: DC
  Administered 2023-08-19: 100 mg via INTRAVENOUS
  Filled 2023-08-18: qty 2

## 2023-08-18 MED ORDER — LORAZEPAM 1 MG PO TABS
1.0000 mg | ORAL_TABLET | ORAL | Status: DC | PRN
  Administered 2023-08-19 (×2): 4 mg via ORAL
  Administered 2023-08-19: 3 mg via ORAL
  Administered 2023-08-19: 2 mg via ORAL
  Filled 2023-08-18 (×2): qty 4
  Filled 2023-08-18: qty 2
  Filled 2023-08-18: qty 3

## 2023-08-18 MED ORDER — ADULT MULTIVITAMIN W/MINERALS CH
1.0000 | ORAL_TABLET | Freq: Every day | ORAL | Status: DC
  Administered 2023-08-18 – 2023-08-20 (×2): 1 via ORAL
  Filled 2023-08-18 (×2): qty 1

## 2023-08-18 NOTE — Progress Notes (Signed)
[facility]                                                                              Richard Lopez, is a 47 y.o. male, DOB - 1976/06/22, NWG:[id number] Admit date - 08/17/2023    Outpatient Primary MD for the patient is Randa Evens, Kinnie Scales, NP  LOS - 1  days  Chief Complaint  Patient presents with   Abdominal Pain   Weakness       Brief summary   Patient is a 47 year old male with prior PE, treated, HTN, severe esophagitis, DM, depression, anxiety presented with abdominal pain, nausea vomiting, dehydration.  He has a history of severe erosive esophagitis, esophageal ulcers, gastritis found on EGD 11/2022, was placed on PPI and Carafate.  Patient reported that he takes Protonix, had been in usual state of health until the last couple of weeks when he started having abdominal pain.  It worsened in the last week due to stress (multiple deaths in the family/friends).  He also barely ate in the last week, had a severe worsening epigastric pain, nausea and vomiting.  In the last 24 hours prior to admission, felt diffusely tender.  No melena, hematochezia or hematemesis.  No prior history of pancreatitis. In ED, temp 98.1 F RR 42, pulse 117, BP 161/109 Sodium 131, 16, BUN 20, creatinine 2.18.  Baseline creatinine 1.0 on 05/23/2023  LFTs showed AST 183, ALT 101, lipase 72, total bilirubin 1.7  Assessment & Plan    Principal Problem: Acute abdominal pain, nausea and vomiting likely due to esophagitis, possible acute cholecystitis, mild pancreatitis -Patient has a history of severe erosive esophagitis and gastritis, continue Protonix, Carafate -Placed on IV fluid hydration, tolerated diet this morning -Also noted elevated LFTs, lipase on admission -Right upper quadrant ultrasound showed distended gallbladder with gallstones, slight wall thickening, no biliary duct dilation -General Surgery consulted, n.p.o. plan for HIDA scan today -Continue pain control, IV fluids -LFTs  improving, lipase 72 on admission-> 60   Active Problems:   AKI (acute kidney injury) (HCC) -Suspect prerenal due to significant dehydration, poor oral intake in the last 1 week, nausea and vomiting -Continue IV fluid hydration, encourage oral fluid intake -Creatinine 2.18 on admission, baseline 1.0 on 05/23/23 -Continue gentle hydration today, creatinine improving  Mild hyponatremia -Likely due to hypovolemia, poor oral intake, continue gentle hydration  Hypokalemia -Replaced    Type 2 diabetes mellitus, diet controlled -Continue sliding scale insulin while inpatient -Hemoglobin A1c 5.5 on 08/17/2023  Depression, anxiety -Due to recent life stressors, death in the family/friends -TOC consult  Hypertension -Continue amlodipine  History of alcohol use -Placed on CIWA with Ativan PRN, thiamine, folate -Currently stable, not in any acute withdrawals   Estimated body mass index is 25.82 kg/m as calculated from the following:   Height as of 05/21/23: 6\' 6"  (1.981 m).   Weight as of 05/21/23: 101.3 kg.  Code Status: Full CODE STATUS DVT Prophylaxis:  enoxaparin (LOVENOX) injection 40 mg Start: 08/17/23 1000 SCDs Start: 08/17/23 0746   Level of Care: Level of care: Progressive Family Communication: Updated patient Disposition Plan:      Remains inpatient  appropriate:      Procedures:    Consultants:   General surgery  Antimicrobials:   Anti-infectives (From admission, onward)    None          Medications  amLODipine  10 mg Oral Daily   enoxaparin (LOVENOX) injection  40 mg Subcutaneous Q24H   insulin aspart  0-15 Units Subcutaneous TID WC   insulin aspart  0-5 Units Subcutaneous QHS   pantoprazole  40 mg Oral Daily   sucralfate  1 g Oral TID WC & HS      Subjective:   Richard Lopez was seen and examined today.  Still complaining of generalized abdominal pain, was able to tolerate breakfast this morning however did have postprandial abdominal pain,  nausea during the last 2 weeks.  Patient denies dizziness, chest pain, shortness of breath, fevers.  No acute events overnight.    Objective:   Vitals:   08/17/23 0935 08/17/23 2051 08/18/23 0635 08/18/23 0825  BP: (!) 151/113 131/82 123/84 125/85  Pulse: 90 98 74   Resp:  20 18   Temp: 98.8 F (37.1 C) 98.7 F (37.1 C) 97.6 F (36.4 C)   TempSrc: Oral Oral Oral   SpO2: 100% 100% 100%     Intake/Output Summary (Last 24 hours) at 08/18/2023 1202 Last data filed at 08/18/2023 0900 Gross per 24 hour  Intake 2545.6 ml  Output 260 ml  Net 2285.6 ml     Wt Readings from Last 3 Encounters:  05/21/23 101.3 kg  05/04/23 109.1 kg  04/20/23 101.5 kg     Exam General: Alert and oriented x 3, NAD Cardiovascular: S1 S2 auscultated,  RRR Respiratory: Clear to auscultation bilaterally, no wheezing, rales or rhonchi Gastrointestinal: Soft, mild generalized TTP, worse in epigastric and RUQ, ND, NBS  Ext: no pedal edema bilaterally Neuro: no neurodeficits, ambulating in the room Psych: Normal affect     Data Reviewed:  I have personally reviewed following labs    CBC Lab Results  Component Value Date   WBC 4.6 08/18/2023   RBC 3.77 (L) 08/18/2023   HGB 11.5 (L) 08/18/2023   HCT 33.2 (L) 08/18/2023   MCV 88.1 08/18/2023   MCH 30.5 08/18/2023   PLT 123 (L) 08/18/2023   MCHC 34.6 08/18/2023   RDW 14.5 08/18/2023   LYMPHSABS 2.5 08/17/2023   MONOABS 1.0 08/17/2023   EOSABS 0.1 08/17/2023   BASOSABS 0.1 08/17/2023     Last metabolic panel Lab Results  Component Value Date   NA 129 (L) 08/18/2023   K 3.4 (L) 08/18/2023   CL 95 (L) 08/18/2023   CO2 23 08/18/2023   BUN 17 08/18/2023   CREATININE 1.07 08/18/2023   GLUCOSE 101 (H) 08/18/2023   GFRNONAA >60 08/18/2023   GFRAA 132 07/16/2020   CALCIUM 8.5 (L) 08/18/2023   PHOS 2.3 (L) 04/22/2023   PROT 7.5 08/18/2023   ALBUMIN 3.8 08/18/2023   LABGLOB 3.3 07/16/2020   AGRATIO 1.3 07/16/2020   BILITOT 1.9 (H)  08/18/2023   ALKPHOS 61 08/18/2023   AST 108 (H) 08/18/2023   ALT 76 (H) 08/18/2023   ANIONGAP 11 08/18/2023    CBG (last 3)  Recent Labs    08/17/23 2342 08/18/23 0724 08/18/23 1128  GLUCAP 122* 86 97      Coagulation Profile: No results for input(s): "INR", "PROTIME" in the last 168 hours.   Radiology Studies: I have personally reviewed the imaging studies  US Abdomen Limited RUQ (LIVER/GB)  Result Date: 08/17/2023  CLINICAL DATA:  LFT elevation. EXAM: ULTRASOUND ABDOMEN LIMITED RIGHT UPPER QUADRANT COMPARISON:  CT 04/20/2023.  Ultrasound 11/08/2022 FINDINGS: Gallbladder: Distended gallbladder with multiple shadowing stones. Wall thickness of up to 4 mm, slightly thickened. No adjacent fluid. No reported sonographic Murphy's sign Common bile duct: Diameter: 2 mm Liver: Diffusely echogenic hepatic parenchyma consistent with fatty liver infiltration. Portal vein is patent on color Doppler imaging with normal direction of blood flow towards the liver. Other: None. IMPRESSION: Distended gallbladder with stones. Slight wall thickening. Please correlate for other clinical signs of acute cholecystitis and if needed HIDA scan. No biliary ductal dilatation. Fatty liver infiltration Electronically Signed   By: Karen Kays M.D.   On: 08/17/2023 09:45       Pristine Gladhill M.D. [facility] 08/18/2023, 12:02 PM  Available via Epic secure chat 7am-7pm After 7 pm, please refer to night coverage provider listed on amion.

## 2023-08-18 NOTE — Plan of Care (Signed)
  Problem: Education: Goal: Ability to describe self-care measures that may prevent or decrease complications (Diabetes Survival Skills Education) will improve Outcome: Progressing Goal: Individualized Educational Video(s) Outcome: Progressing   Problem: Coping: Goal: Ability to adjust to condition or change in health will improve Outcome: Progressing   Problem: Fluid Volume: Goal: Ability to maintain a balanced intake and output will improve Outcome: Progressing   Problem: Skin Integrity: Goal: Risk for impaired skin integrity will decrease Outcome: Progressing   Problem: Tissue Perfusion: Goal: Adequacy of tissue perfusion will improve Outcome: Progressing

## 2023-08-18 NOTE — Consult Note (Addendum)
Richard Lopez [birth year]  [id number].    Requesting MD: Dr. Isidoro Donning Chief Complaint/Reason for Consult: abnormal LFTs, cholelithiasis  HPI: Richard Lopez is a 47 y.o. male who presented to [facility] long ED on 10/14 with abdominal pain and vomiting.  Patient reports longstanding history of intermittent abdominal pain for several years.  His most recent episode started 5 days ago after p.o. intake that was followed shortly thereafter by nausea, vomiting and diffuse generalized abdominal pain.  He reports the abdominal pain is worse before vomiting and then improves after episode of emesis.  Since admission his pain has improved and now he is just "sore" throughout his entire abdomen. He tolerated eggs for breakfast this morning without worsening abdominal pain, nausea or vomiting.  He is passing flatus.  BM this morning. He denies melena or hematochezia. No fever or urinary symptoms. Denies hx of pancreatitis. Reports this feels similar to when he was admitted in Jan but at that time he was also having throat pain.   Workup with: WBC 6.7 > 4.6, Lactic wnl, Lipase 72 > 60, Alk phos wnl, AST 183 > 108, ALT 101 > 76, T. Bili 1.7 > 1.9. Hepatitis panel negative. RUQ [place] w/ Distended gallbladder with cholelithiasis. Slight wall thickening at 4mm. No pericholecystitis fluid. CBD 2mm. In review patient did have elevated AST and ALT on labs earlier this month.   To note he was also found to have AKI that resolved with IVF and Hyponatremia with Na of 129 on today's labs.   Hx of esophagitis, esophageal ulcers and erythematous mucosa in the cardia, gastric fundus, gastric body + antrum on EGD in Jan 2024. Bx w/o H. Pylori. He has been taking his PPI.  He denies any NSAID use since his diagnosis in January.  He reports heavy alcohol use in the past with 1/5 of liquor plus a case of beer daily.  He has since cut down to a beer every other day.  Smokes 2 cigarettes daily.  Past Medical History: HTN, DM, PE after  surgery - no longer on anticoagulation. Esophagitis/esophageal ulcers/gastritis as above.  Prior Abdominal Surgeries: None Blood Thinners: None Allergies: NKDA Tobacco Use: 2 cigarettes daily Alcohol Use: Prior heavy alcohol use.  Now drinks 1 beer every other day Substance use: Marijuana.  No other illicit drug use  ROS: ROS As above, see hpi  Family History  Problem Relation Age of Onset   Diabetes Mellitus II Father    Diabetes Mellitus II Other    CAD Other     Past Medical History:  Diagnosis Date   Anxiety    Arthritis    hands and possibly knee   Depression    Diabetes mellitus    diet controlled   Essential hypertension    Gastritis    Gout    Normocytic anemia due to blood loss 08/21/2019   Pulmonary embolism [facility])     Past Surgical History:  Procedure Laterality Date   BIOPSY  11/12/2022   Procedure: BIOPSY;  Surgeon: Kerin Salen, MD;  Location: [facility];  Service: Gastroenterology;;   ESOPHAGOGASTRODUODENOSCOPY (EGD) WITH PROPOFOL N/A 08/14/2019   Procedure: ESOPHAGOGASTRODUODENOSCOPY (EGD) WITH PROPOFOL;  Surgeon: Charlott Rakes, MD;  Location: [facility];  Service: Endoscopy;  Laterality: N/A;   ESOPHAGOGASTRODUODENOSCOPY (EGD) WITH PROPOFOL N/A 11/12/2022   Procedure: ESOPHAGOGASTRODUODENOSCOPY (EGD) WITH PROPOFOL;  Surgeon: Kerin Salen, MD;  Location: [facility];  Service: Gastroenterology;  Laterality: N/A;   EXTERNAL FIXATION LEG Left 11/07/2018  Procedure: EXTERNAL FIXATION LEFT LOWER LEG;  Surgeon: Samson Frederic, MD;  Location: [facility];  Service: Orthopedics;  Laterality: Left;   EXTERNAL FIXATION REMOVAL Left 11/08/2018   Procedure: REMOVAL EXTERNAL FIXATION LEG;  Surgeon: Roby Lofts, MD;  Location: [facility] OR;  Service: Orthopedics;  Laterality: Left;   INTRAMEDULLARY (IM) NAIL INTERTROCHANTERIC Right 03/26/2019   Procedure: INTRAMEDULLARY (IM) NAIL INTERTROCHANTRIC;  Surgeon: Roby Lofts, MD;  Location: [facility] OR;  Service: Orthopedics;   Laterality: Right;   INTRAMEDULLARY (IM) NAIL INTERTROCHANTERIC Right 05/17/2019   Procedure: Intramedullary (Im) Nail Intertroch with circlage wiring;  Surgeon: Myrene Galas, MD;  Location: [facility] OR;  Service: Orthopedics;  Laterality: Right;   NO PAST SURGERIES     OPEN REDUCTION INTERNAL FIXATION (ORIF) TIBIA/FIBULA FRACTURE Left 11/08/2018   Procedure: OPEN REDUCTION INTERNAL FIXATION (ORIF) TIBIA/FIBULA FRACTURE;  Surgeon: Roby Lofts, MD;  Location: [facility];  Service: Orthopedics;  Laterality: Left;   ORIF FEMUR FRACTURE Right 05/17/2019   Procedure: REMOVAL  OF HARDWARE;  Surgeon: Myrene Galas, MD;  Location: [facility];  Service: Orthopedics;  Laterality: Right;    Social History:  reports that he has quit smoking. His smoking use included cigarettes. He has never used smokeless tobacco. He reports current alcohol use of about 200.0 standard drinks of alcohol per week. He reports current drug use. Drug: Marijuana.  Allergies: No Known Allergies  Medications Prior to Admission  Medication Sig Dispense Refill   acetaminophen (TYLENOL) 500 MG tablet Take 1,000 mg by mouth daily as needed for mild pain.     amLODipine (NORVASC) 10 MG tablet Take 1 tablet (10 mg total) by mouth daily. 30 tablet 1   diclofenac Sodium (VOLTAREN) 1 % GEL Apply 1 Application topically as needed (pain).     feeding supplement (ENSURE ENLIVE / ENSURE PLUS) LIQD Take 237 mLs by mouth 3 (three) times daily between meals. (Patient taking differently: Take 237 mLs by mouth daily.) 237 mL 12   Multiple Vitamin (MULTIVITAMIN WITH MINERALS) TABS tablet Take 1 tablet by mouth daily. 30 tablet 0   pantoprazole (PROTONIX) 40 MG tablet Take 1 tablet (40 mg total) by mouth daily for 28 days. (Patient not taking: Reported on 08/17/2023) 28 tablet 0   potassium chloride SA (KLOR-CON M) 20 MEQ tablet Take 2 tablets (40 mEq total) by mouth daily for 3 days. (Patient not taking: Reported on 05/13/2023) 3 tablet 0   predniSONE (STERAPRED  UNI-PAK 21 TAB) 10 MG (21) TBPK tablet Prednisone dose pack directions Day 1 take 6 tablets Day 2 take 5 tablets Day 3 take 4 tablets Day 4 take 3 tablets Day 3 take 2 tablets Day 4 take 1 tablet Dispense 21 tablets (Patient not taking: Reported on 08/17/2023) 21 tablet 0     Physical Exam: Blood pressure 125/85, pulse 74, temperature 97.6 F (36.4 C), temperature source Oral, resp. rate 18, SpO2 100%. General: pleasant, WD/WN male who is laying in bed in NAD HEENT: head is normocephalic, atraumatic.  Heart: regular, rate, and rhythm.   Lungs:  Respiratory effort nonlabored Abd: Soft, ND, mild generalized ttp without rigidity or guarding, +BS. No masses, hernias, or organomegaly Skin: warm and dry  Psych: A&Ox4 with an appropriate affect Neuro: normal speech, thought process intact, moves all extremities, gait not assessed   Results for orders placed or performed during the hospital encounter of 08/17/23 (from the past 48 hour(s))  CBC with Differential     Status: Abnormal   Collection Time: 08/17/23  4:00 AM  Result Value Ref Range   WBC 6.7 4.0 - 10.5 K/uL   RBC 4.42 4.22 - 5.81 MIL/uL   Hemoglobin 13.6 13.0 - 17.0 g/dL   HCT 40.9 (L) 81.1 - 91.4 %   MCV 86.9 80.0 - 100.0 fL   MCH 30.8 26.0 - 34.0 pg   MCHC 35.4 30.0 - 36.0 g/dL   RDW 78.2 95.6 - 21.3 %   Platelets 141 (L) 150 - 400 K/uL   nRBC 0.0 0.0 - 0.2 %   Neutrophils Relative % 45 %   Neutro Abs 3.0 1.7 - 7.7 K/uL   Lymphocytes Relative 38 %   Lymphs Abs 2.5 0.7 - 4.0 K/uL   Monocytes Relative 15 %   Monocytes Absolute 1.0 0.1 - 1.0 K/uL   Eosinophils Relative 1 %   Eosinophils Absolute 0.1 0.0 - 0.5 K/uL   Basophils Relative 1 %   Basophils Absolute 0.1 0.0 - 0.1 K/uL   Immature Granulocytes 0 %   Abs Immature Granulocytes 0.02 0.00 - 0.07 K/uL    Comment: Performed at [facility], [address] [place], [place] [address]  Hepatitis panel, acute     Status: None   Collection Time: 08/17/23   4:09 AM  Result Value Ref Range   Hepatitis B Surface Ag NON REACTIVE NON REACTIVE   HCV Ab NON REACTIVE NON REACTIVE    Comment: (NOTE) Nonreactive HCV antibody screen is consistent with no HCV infections,  unless recent infection is suspected or other evidence exists to indicate HCV infection.     Hep A IgM NON REACTIVE NON REACTIVE   Hep B C IgM NON REACTIVE NON REACTIVE    Comment: Performed at [facility], [address] [place], [place] [address]  Hemoglobin A1c     Status: None   Collection Time: 08/17/23  4:09 AM  Result Value Ref Range   Hgb A1c MFr Bld 5.5 4.8 - 5.6 %    Comment: (NOTE) Pre diabetes:          5.7%-6.4%  Diabetes:              >6.4%  Glycemic control for   <7.0% adults with diabetes    Mean Plasma Glucose 111.15 mg/dL    Comment: Performed at [facility], [address], [place], [place] [address]  Blood gas, venous     Status: Abnormal   Collection Time: 08/17/23  4:13 AM  Result Value Ref Range   pH, Ven 7.4 7.25 - 7.43   pCO2, Ven 23 (L) 44 - 60 mmHg   pO2, Ven <31 (LL) 32 - 45 mmHg    Comment: CRITICAL RESULT CALLED TO, READ BACK BY AND VERIFIED WITH: ABREU B. @0427  08/17/2023 MCLEAN K.    Bicarbonate 14.2 (L) 20.0 - 28.0 mmol/L   Acid-base deficit 8.6 (H) 0.0 - 2.0 mmol/L   O2 Saturation 47.4 %   Patient temperature 37.0     Comment: Performed at [facility], [address], [place], [place] [address]  POC CBG, ED     Status: Abnormal   Collection Time: 08/17/23  4:15 AM  Result Value Ref Range   Glucose-Capillary 158 (H) 70 - 99 mg/dL    Comment: Glucose reference range applies only to samples taken after fasting for at least 8 hours.  Comprehensive metabolic panel     Status: Abnormal   Collection Time: 08/17/23  5:42 AM  Result Value  Ref Range   Sodium 131 (L) 135 - 145 mmol/L    Comment: ELECTROLYTES REPEATED TO VERIFY   Potassium 3.5 3.5 - 5.1 mmol/L   Chloride 92 (L) 98 - 111 mmol/L     Comment: ELECTROLYTES REPEATED TO VERIFY   CO2 16 (L) 22 - 32 mmol/L    Comment: ELECTROLYTES REPEATED TO VERIFY   Glucose, Bld 123 (H) 70 - 99 mg/dL    Comment: Glucose reference range applies only to samples taken after fasting for at least 8 hours.   BUN 20 6 - 20 mg/dL   Creatinine, Ser 4.09 (H) 0.61 - 1.24 mg/dL   Calcium 9.3 8.9 - 81.1 mg/dL   Total Protein 8.5 (H) 6.5 - 8.1 g/dL   Albumin 4.9 3.5 - 5.0 g/dL   AST 914 (H) 15 - 41 U/L   ALT 101 (H) 0 - 44 U/L   Alkaline Phosphatase 79 38 - 126 U/L   Total Bilirubin 1.7 (H) 0.3 - 1.2 mg/dL   GFR, Estimated 37 (L) >60 mL/min    Comment: (NOTE) Calculated using the CKD-EPI Creatinine Equation (2021)    Anion gap >20 (H) 5 - 15    Comment: ELECTROLYTES REPEATED TO VERIFY Performed at [facility], [address], [place], [place] [address]   Lipase, blood     Status: Abnormal   Collection Time: 08/17/23  5:42 AM  Result Value Ref Range   Lipase 72 (H) 11 - 51 U/L    Comment: Performed at [facility], [address], [place], [place] [address]  Magnesium     Status: Abnormal   Collection Time: 08/17/23  5:42 AM  Result Value Ref Range   Magnesium 1.4 (L) 1.7 - 2.4 mg/dL    Comment: Performed at [facility], [address], [place], [place] [address]  Beta-hydroxybutyric acid     Status: Abnormal   Collection Time: 08/17/23  7:25 AM  Result Value Ref Range   Beta-Hydroxybutyric Acid 3.58 (H) 0.05 - 0.27 mmol/L    Comment: Performed at [facility], [address] [place], [place] [address]  Acetaminophen level     Status: Abnormal   Collection Time: 08/17/23  7:25 AM  Result Value Ref Range   Acetaminophen (Tylenol), Serum <10 (L) 10 - 30 ug/mL    Comment: (NOTE) Therapeutic concentrations vary significantly. A range of 10-30 ug/mL  may be an effective concentration for many patients. However, some  are best treated at concentrations outside of  this range. Acetaminophen concentrations >150 ug/mL at 4 hours after ingestion  and >50 ug/mL at 12 hours after ingestion are often associated with  toxic reactions.  Performed at [facility], [address] [place], [place] [address]   I-Stat Lactic Acid     Status: Abnormal   Collection Time: 08/17/23  7:32 AM  Result Value Ref Range   Lactic Acid, Venous 2.0 (HH) 0.5 - 1.9 mmol/L  CBG monitoring, ED     Status: Abnormal   Collection Time: 08/17/23  8:54 AM  Result Value Ref Range   Glucose-Capillary 113 (H) 70 - 99 mg/dL    Comment: Glucose reference range applies only to samples taken after fasting for at least 8 hours.  Glucose, capillary     Status: Abnormal   Collection Time: 08/17/23  9:35 AM  Result Value Ref Range   Glucose-Capillary 157 (H) 70 - 99 mg/dL    Comment: Glucose reference range  applies only to samples taken after fasting for at least 8 hours.   Comment 1 Notify RN    Comment 2 Document in Chart   Lactic acid, plasma     Status: None   Collection Time: 08/17/23 11:36 AM  Result Value Ref Range   Lactic Acid, Venous 1.4 0.5 - 1.9 mmol/L    Comment: Performed at [facility], [address] [place], [place] [address]  Basic metabolic panel     Status: Abnormal   Collection Time: 08/17/23  5:36 PM  Result Value Ref Range   Sodium 132 (L) 135 - 145 mmol/L   Potassium 3.6 3.5 - 5.1 mmol/L   Chloride 94 (L) 98 - 111 mmol/L   CO2 21 (L) 22 - 32 mmol/L   Glucose, Bld 89 70 - 99 mg/dL    Comment: Glucose reference range applies only to samples taken after fasting for at least 8 hours.   BUN 20 6 - 20 mg/dL   Creatinine, Ser 6.44 (H) 0.61 - 1.24 mg/dL   Calcium 8.8 (L) 8.9 - 10.3 mg/dL   GFR, Estimated >03 >47 mL/min    Comment: (NOTE) Calculated using the CKD-EPI Creatinine Equation (2021)    Anion gap 17 (H) 5 - 15    Comment: Performed at [facility], [address] [place], [place] [address]   Glucose, capillary     Status: Abnormal   Collection Time: 08/17/23  6:19 PM  Result Value Ref Range   Glucose-Capillary 156 (H) 70 - 99 mg/dL    Comment: Glucose reference range applies only to samples taken after fasting for at least 8 hours.  Glucose, capillary     Status: Abnormal   Collection Time: 08/17/23 11:42 PM  Result Value Ref Range   Glucose-Capillary 122 (H) 70 - 99 mg/dL    Comment: Glucose reference range applies only to samples taken after fasting for at least 8 hours.  Basic metabolic panel     Status: Abnormal   Collection Time: 08/18/23  4:14 AM  Result Value Ref Range   Sodium 129 (L) 135 - 145 mmol/L   Potassium 3.4 (L) 3.5 - 5.1 mmol/L   Chloride 95 (L) 98 - 111 mmol/L   CO2 23 22 - 32 mmol/L   Glucose, Bld 101 (H) 70 - 99 mg/dL    Comment: Glucose reference range applies only to samples taken after fasting for at least 8 hours.   BUN 17 6 - 20 mg/dL   Creatinine, Ser 6.38 0.61 - 1.24 mg/dL   Calcium 8.5 (L) 8.9 - 10.3 mg/dL   GFR, Estimated >75 >64 mL/min    Comment: (NOTE) Calculated using the CKD-EPI Creatinine Equation (2021)    Anion gap 11 5 - 15    Comment: Performed at [facility], [address], [place], [place] [address]  CBC     Status: Abnormal   Collection Time: 08/18/23  4:14 AM  Result Value Ref Range   WBC 4.6 4.0 - 10.5 K/uL   RBC 3.77 (L) 4.22 - 5.81 MIL/uL   Hemoglobin 11.5 (L) 13.0 - 17.0 g/dL   HCT 18.8 (L) 41.6 - 60.6 %   MCV 88.1 80.0 - 100.0 fL   MCH 30.5 26.0 - 34.0 pg   MCHC 34.6 30.0 - 36.0 g/dL   RDW 30.1 60.1 - 09.3 %   Platelets 123 (L) 150 - 400 K/uL   nRBC 0.0 0.0 - 0.2 %  Comment: Performed at [facility], [address] [place], [place] [address]  Hepatic function panel     Status: Abnormal   Collection Time: 08/18/23  4:14 AM  Result Value Ref Range   Total Protein 7.5 6.5 - 8.1 g/dL   Albumin 3.8 3.5 - 5.0 g/dL   AST 272 (H) 15 - 41 U/L   ALT 76 (H) 0 - 44 U/L    Alkaline Phosphatase 61 38 - 126 U/L   Total Bilirubin 1.9 (H) 0.3 - 1.2 mg/dL   Bilirubin, Direct 0.3 (H) 0.0 - 0.2 mg/dL   Indirect Bilirubin 1.6 (H) 0.3 - 0.9 mg/dL    Comment: Performed at [facility], [address] [place], [place] [address]  Lipase, blood     Status: Abnormal   Collection Time: 08/18/23  4:14 AM  Result Value Ref Range   Lipase 60 (H) 11 - 51 U/L    Comment: Performed at [facility], [address], [place], [place] [address]  Glucose, capillary     Status: None   Collection Time: 08/18/23  7:24 AM  Result Value Ref Range   Glucose-Capillary 86 70 - 99 mg/dL    Comment: Glucose reference range applies only to samples taken after fasting for at least 8 hours.   US Abdomen Limited RUQ (LIVER/GB)  Result Date: 08/17/2023 CLINICAL DATA:  LFT elevation. EXAM: ULTRASOUND ABDOMEN LIMITED RIGHT UPPER QUADRANT COMPARISON:  CT 04/20/2023.  Ultrasound 11/08/2022 FINDINGS: Gallbladder: Distended gallbladder with multiple shadowing stones. Wall thickness of up to 4 mm, slightly thickened. No adjacent fluid. No reported sonographic Murphy's sign Common bile duct: Diameter: 2 mm Liver: Diffusely echogenic hepatic parenchyma consistent with fatty liver infiltration. Portal vein is patent on color Doppler imaging with normal direction of blood flow towards the liver. Other: None. IMPRESSION: Distended gallbladder with stones. Slight wall thickening. Please correlate for other clinical signs of acute cholecystitis and if needed HIDA scan. No biliary ductal dilatation. Fatty liver infiltration Electronically Signed   By: Karen Kays M.D.   On: 08/17/2023 09:45    Anti-infectives (From admission, onward)    None       Assessment/Plan Cholelithiasis with elevated LFT's  This is a 47 year old male who we are asked to see for abdominal pain and elevated LFTs.  Patient does report history of postprandial nausea and vomiting.  RUQ [place] with  cholelithiasis.  There is also very mild gallbladder wall thickening of 4 mm.  On exam patient has mild generalized tenderness to palpation of the abdomen.   He is currently HDS without fever, tachycardia or hypotension. WBC wnl. No peritonitis on exam. No indication for emergency surgery at this time.   Reviewed case with my attending.  Will get HIDA with EF to determine if this is truly from cholecystitis.  If HIDA is positive would recommend cholecystectomy during mission.  If HIDA is negative would recommend GI consultation for further workup given patient's history of esophageal ulcers, esophagitis, and gastritis with ongoing alcohol and tobacco use.   Continue to trend labs.  We will follow with you. NPO for HIDA.   I reviewed nursing notes, last 24 h vitals and pain scores, last 48 h intake and output, last 24 h labs and trends, and last 24 h imaging results.   Jacinto Halim, [facility] 08/18/2023, 10:21 AM Please see Amion for pager number during day hours 7:00am-4:30pm

## 2023-08-18 NOTE — TOC Initial Note (Signed)
Transition of Care [facility]) - Initial/Assessment Note    Patient Details  Name: Richard Lopez MRN: [id number] Date of Birth: Jan 29, 1976  [facility] of [facility]) CM/SW Contact:    Larrie Kass, LCSW Phone Number: 08/18/2023, 1:52 PM  Clinical Narrative:                 CSW received consult for substance use resource. CSW spoke with pt and he has agreed to receive resources. Resources was added to AVS. Pt stated he will need transportation assistance. Pt can receive a bus pass for transportation. TOC to follow for d/c needs.  Patient Goals and CMS Choice Patient states their goals for this hospitalization and ongoing recovery are:: retrun home          Expected Discharge Plan and Services                                              Prior Living Arrangements/Services     Patient language and need for interpreter reviewed:: Yes Do you feel safe going back to the place where you live?: Yes      Need for Family Participation in Patient Care: No (Comment) Care giver support system in place?: No (comment)   Criminal Activity/Legal Involvement Pertinent to Current Situation/Hospitalization: Yes - Comment as needed  Activities of Daily Living   ADL Screening (condition at time of admission) Independently performs ADLs?: Yes (appropriate for developmental age) Is the patient deaf or have difficulty hearing?: No Does the patient have difficulty seeing, even when wearing glasses/contacts?: No Does the patient have difficulty concentrating, remembering, or making decisions?: No  Permission Sought/Granted                  Emotional Assessment   Attitude/Demeanor/Rapport: Gracious, Charismatic Affect (typically observed): Accepting Orientation: : Oriented to Place, Oriented to Self, Oriented to  Time, Oriented to Situation   Psych Involvement: No (comment)  Admission diagnosis:  Hypomagnesemia [E83.42] Metabolic acidosis [E87.20] Dehydration with  hyponatremia [E86.0, E87.1] Serum total bilirubin elevated [R17] Acute kidney injury (nontraumatic) (HCC) [N17.9] Elevated transaminase level [R74.01] Elevated lipase [R74.8] Patient Active Problem List   Diagnosis Date Noted   Esophagitis 08/18/2023   Cholelithiasis 08/18/2023   Abdominal pain 08/18/2023   Dehydration with hyponatremia 08/17/2023   Depression 06/17/2023   Abnormal LFTs 04/20/2023   Hypercalcemia 04/20/2023   Hypocalcemia 11/09/2022   Acute renal failure (HCC) 11/09/2022   Elevated CPK 11/08/2022   Elevated LFTs 11/08/2022   Intractable nausea and vomiting 02/12/2021   AF (paroxysmal atrial fibrillation) (HCC)    Transaminitis    Dehydration    AKI (acute kidney injury) (HCC) 06/18/2020   Atrial fibrillation with RVR (HCC) 04/22/2020   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   Hyponatremia 04/21/2020   Aspiration pneumonia (HCC) 04/21/2020   Esophagitis determined by endoscopy 08/21/2019   Anemia 08/21/2019   Hypokalemia 08/17/2019   Hypomagnesemia 08/17/2019   Hemoptysis 08/17/2019   Acute upper GI bleed 08/14/2019   Hematemesis 08/13/2019   Cavitary lesion of lung 08/01/2019   Gout 05/18/2019   Peri-prosthetic fracture of femur at tip of prosthesis 05/16/2019   Alcohol use 05/16/2019   Intertrochanteric fracture of right hip (HCC) 04/14/2019   Vitamin D deficiency 04/14/2019   DTs (delirium tremens) (HCC) 04/07/2019   Delirium tremens (HCC) 04/07/2019   Encephalopathy acute    Closed  right hip fracture, initial encounter (HCC) 03/25/2019   Alcohol dependence with intoxication (HCC) 03/25/2019   Hypertensive urgency 03/25/2019   Thrombocytopenia (HCC) 03/25/2019   Effusion, right knee 03/25/2019   Closed fracture of right femur (HCC)    High anion gap metabolic acidosis    Prediabetes 04/02/2017   Gastritis 04/02/2017   Marijuana abuse 04/01/2017   Nausea with vomiting 07/29/2014   Type 2 diabetes mellitus with hyperlipidemia (HCC) 07/29/2014    Essential hypertension 07/29/2014   Nausea & vomiting 07/29/2014   PCP:  [name], NP Pharmacy:   [facility] [place], [place] - [address] [place] [address] Phone: [phone] Fax: [phone]  [facility] [address] [place]), [place] - [address] [place] [address] Phone: [phone] Fax: [phone]  [facility] [address] [place] [address] Phone: [phone] Fax: [phone]     Social Determinants of Health (SDOH) Social History: SDOH Screenings   Food Insecurity: No Food Insecurity (08/17/2023)  Housing: Patient Unable To Answer (08/17/2023)  Transportation Needs: No Transportation Needs (08/17/2023)  Utilities: Not At Risk (08/17/2023)  Depression (PHQ2-9): High Risk (05/21/2023)  Financial Resource Strain: Medium Risk (11/07/2018)  Physical Activity: Unknown (11/07/2018)  Social Connections: Unknown (11/07/2018)  Stress: Stress Concern Present (11/07/2018)  Tobacco Use: Medium Risk (08/18/2023)   SDOH Interventions:     Readmission Risk Interventions    04/21/2023    2:37 PM  Readmission Risk Prevention Plan  Transportation Screening Complete  PCP or Specialist Appt within 3-5 Days Complete  HRI or Home Care Consult Complete  Social Work Consult for Recovery Care Planning/Counseling Complete  Palliative Care Screening Not Applicable  Medication Review Oceanographer) Complete

## 2023-08-18 NOTE — Plan of Care (Signed)
  Problem: Coping: Goal: Ability to adjust to condition or change in health will improve Outcome: Progressing   Problem: Elimination: Goal: Will not experience complications related to bowel motility Outcome: Progressing Goal: Will not experience complications related to urinary retention Outcome: Progressing   Problem: Safety: Goal: Ability to remain free from injury will improve Outcome: Progressing

## 2023-08-19 DIAGNOSIS — F10931 Alcohol use, unspecified with withdrawal delirium: Secondary | ICD-10-CM | POA: Diagnosis not present

## 2023-08-19 DIAGNOSIS — R109 Unspecified abdominal pain: Secondary | ICD-10-CM | POA: Diagnosis not present

## 2023-08-19 LAB — COMPREHENSIVE METABOLIC PANEL
ALT: 72 U/L — ABNORMAL HIGH (ref 0–44)
ALT: 76 U/L — ABNORMAL HIGH (ref 0–44)
AST: 90 U/L — ABNORMAL HIGH (ref 15–41)
AST: 105 U/L — ABNORMAL HIGH (ref 15–41)
Albumin: 3.9 g/dL (ref 3.5–5.0)
Albumin: 3.6 g/dL (ref 3.5–5.0)
Alkaline Phosphatase: 63 U/L (ref 38–126)
Alkaline Phosphatase: 55 U/L (ref 38–126)
Anion gap: 12 (ref 5–15)
Anion gap: 10 (ref 5–15)
BUN: 12 mg/dL (ref 6–20)
BUN: 14 mg/dL (ref 6–20)
CO2: 22 mmol/L (ref 22–32)
CO2: 22 mmol/L (ref 22–32)
Calcium: 8.5 mg/dL — ABNORMAL LOW (ref 8.9–10.3)
Calcium: 8.5 mg/dL — ABNORMAL LOW (ref 8.9–10.3)
Chloride: 95 mmol/L — ABNORMAL LOW (ref 98–111)
Chloride: 99 mmol/L (ref 98–111)
Creatinine, Ser: 0.85 mg/dL (ref 0.61–1.24)
Creatinine, Ser: 0.75 mg/dL (ref 0.61–1.24)
GFR, Estimated: >60 mL/min (ref >60)
GFR, Estimated: >60 mL/min (ref >60)
Glucose, Bld: 91 mg/dL (ref 70–99)
Glucose, Bld: 80 mg/dL (ref 70–99)
Potassium: 3.3 mmol/L — ABNORMAL LOW (ref 3.5–5.1)
Potassium: 3.4 mmol/L — ABNORMAL LOW (ref 3.5–5.1)
Sodium: 129 mmol/L — ABNORMAL LOW (ref 135–145)
Sodium: 131 mmol/L — ABNORMAL LOW (ref 135–145)
Total Bilirubin: 1.3 mg/dL — ABNORMAL HIGH (ref 0.3–1.2)
Total Bilirubin: 1.2 mg/dL (ref 0.3–1.2)
Total Protein: 7.1 g/dL (ref 6.5–8.1)
Total Protein: 6.6 g/dL (ref 6.5–8.1)

## 2023-08-19 LAB — CBC
HCT: 33.1 % — ABNORMAL LOW (ref 39.0–52.0)
Hemoglobin: 11.3 g/dL — ABNORMAL LOW (ref 13.0–17.0)
MCH: 30.4 pg (ref 26.0–34.0)
MCHC: 34.1 g/dL (ref 30.0–36.0)
MCV: 89 fL (ref 80.0–100.0)
Platelets: 131 10*3/uL — ABNORMAL LOW (ref 150–400)
RBC: 3.72 MIL/uL — ABNORMAL LOW (ref 4.22–5.81)
RDW: 14.6 % (ref 11.5–15.5)
WBC: 4.5 10*3/uL (ref 4.0–10.5)
nRBC: 0 % (ref 0.0–0.2)

## 2023-08-19 LAB — BLOOD GAS, ARTERIAL
Acid-Base Excess: 0.9 mmol/L (ref 0.0–2.0)
Bicarbonate: 26.6 mmol/L (ref 20.0–28.0)
Drawn by: 560031
O2 Content: 2 L/min
O2 Saturation: 100 %
Patient temperature: 34.9
pCO2 arterial: 42 mm[Hg] (ref 32–48)
pH, Arterial: 7.4 (ref 7.35–7.45)
pO2, Arterial: 99 mm[Hg] (ref 83–108)

## 2023-08-19 LAB — GLUCOSE, CAPILLARY
Glucose-Capillary: 113 mg/dL — ABNORMAL HIGH (ref 70–99)
Glucose-Capillary: 130 mg/dL — ABNORMAL HIGH (ref 70–99)
Glucose-Capillary: 79 mg/dL (ref 70–99)
Glucose-Capillary: 86 mg/dL (ref 70–99)

## 2023-08-19 LAB — PROTIME-INR
INR: 1.1 (ref 0.8–1.2)
Prothrombin Time: 14.5 s (ref 11.4–15.2)

## 2023-08-19 LAB — APTT: aPTT: 27 s (ref 24–36)

## 2023-08-19 LAB — AMMONIA: Ammonia: 31 umol/L (ref 9–35)

## 2023-08-19 LAB — LIPASE, BLOOD: Lipase: 43 U/L (ref 11–51)

## 2023-08-19 LAB — MRSA NEXT GEN BY PCR, NASAL: MRSA by PCR Next Gen: NOT DETECTED

## 2023-08-19 LAB — PHOSPHORUS: Phosphorus: 2.4 mg/dL — ABNORMAL LOW (ref 2.5–4.6)

## 2023-08-19 MED ORDER — MIDAZOLAM HCL 2 MG/2ML IJ SOLN: 1.0000 mg | INTRAMUSCULAR | Status: DC | PRN

## 2023-08-19 MED ORDER — SODIUM CHLORIDE 0.9 % IV SOLN
260.0000 mg | Freq: Once | INTRAVENOUS | Status: AC
  Administered 2023-08-19: 260 mg via INTRAVENOUS
  Filled 2023-08-19: qty 2

## 2023-08-19 MED ORDER — NICOTINE 14 MG/24HR TD PT24
14.0000 mg | MEDICATED_PATCH | Freq: Once | TRANSDERMAL | Status: AC
  Administered 2023-08-19: 14 mg via TRANSDERMAL
  Filled 2023-08-19: qty 1

## 2023-08-19 MED ORDER — PHENOBARBITAL SODIUM 65 MG/ML IJ SOLN: 32.5000 mg | Freq: Three times a day (TID) | INTRAMUSCULAR | Status: DC

## 2023-08-19 MED ORDER — CHLORHEXIDINE GLUCONATE CLOTH 2 % EX PADS: 6.0000 | MEDICATED_PAD | Freq: Every day | CUTANEOUS | Status: DC

## 2023-08-19 MED ORDER — PHENOBARBITAL SODIUM 130 MG/ML IJ SOLN
97.5000 mg | Freq: Three times a day (TID) | INTRAMUSCULAR | Status: DC
  Administered 2023-08-19 – 2023-08-20 (×2): 97.5 mg via INTRAVENOUS
  Filled 2023-08-19 (×3): qty 1

## 2023-08-19 MED ORDER — POTASSIUM CHLORIDE CRYS ER 20 MEQ PO TBCR
20.0000 meq | EXTENDED_RELEASE_TABLET | Freq: Once | ORAL | Status: AC
  Administered 2023-08-19: 20 meq via ORAL
  Filled 2023-08-19: qty 1

## 2023-08-19 MED ORDER — DEXMEDETOMIDINE HCL IN NACL 200 MCG/50ML IV SOLN
INTRAVENOUS | Status: AC
  Filled 2023-08-19: qty 50

## 2023-08-19 MED ORDER — DIAZEPAM 5 MG/ML IJ SOLN: 5.0000 mg | Freq: Once | INTRAMUSCULAR | Status: AC

## 2023-08-19 MED ORDER — LORAZEPAM 2 MG/ML IJ SOLN
2.0000 mg | Freq: Once | INTRAMUSCULAR | Status: AC
  Administered 2023-08-19: 2 mg via INTRAVENOUS
  Filled 2023-08-19: qty 1

## 2023-08-19 MED ORDER — DIAZEPAM 5 MG/ML IJ SOLN
INTRAMUSCULAR | Status: AC
  Administered 2023-08-19: 5 mg
  Filled 2023-08-19: qty 2

## 2023-08-19 MED ORDER — PHENOBARBITAL SODIUM 65 MG/ML IJ SOLN: 65.0000 mg | Freq: Three times a day (TID) | INTRAMUSCULAR | Status: DC

## 2023-08-19 MED ORDER — DEXMEDETOMIDINE HCL IN NACL 200 MCG/50ML IV SOLN
0.2000 ug/kg/h | INTRAVENOUS | Status: DC
  Administered 2023-08-19: 0.5 ug/kg/h via INTRAVENOUS
  Administered 2023-08-19: 0.3 ug/kg/h via INTRAVENOUS
  Administered 2023-08-19: 1.2 ug/kg/h via INTRAVENOUS
  Administered 2023-08-20: 0.3 ug/kg/h via INTRAVENOUS
  Filled 2023-08-19 (×3): qty 50

## 2023-08-19 NOTE — Consult Note (Signed)
NAME:  Richard Lopez, MRN:  [id number], DOB:  1976/07/08, LOS: 2 ADMISSION DATE:  08/17/2023, CONSULTATION DATE: 08/18/2023 REFERRING MD: Lynden Oxford, MD, CHIEF COMPLAINT: Alcohol withdrawal  History of Present Illness:   47 year old with prior PE, hypertension, esophagitis, diabetes, anxiety, erosive esophagitis, gastritis, alcohol abuse presenting with abdominal pain, nausea, vomiting.  Being treated for mild pancreatitis, possible acute cholecystitis, AKI.  He developed severe alcohol withdrawal overnight requiring initiation of Precedex and PCCM consulted for help with management.  Pertinent  Medical History   Past Medical History:  Diagnosis Date   Anxiety    Arthritis    hands and possibly knee   Depression    Diabetes mellitus    diet controlled   Essential hypertension    Gastritis    Gout    Normocytic anemia due to blood loss 08/21/2019   Pulmonary embolism (HCC)      Significant Hospital Events: Including procedures, antibiotic start and stop dates in addition to other pertinent events     Interim History / Subjective:    Objective   Blood pressure (!) 135/93, pulse 85, temperature 98.3 F (36.8 C), temperature source Oral, resp. rate 17, weight 99.9 kg, SpO2 97%.        Intake/Output Summary (Last 24 hours) at 08/19/2023 0758 Last data filed at 08/19/2023 0500 Gross per 24 hour  Intake 3204.17 ml  Output 750 ml  Net 2454.17 ml   Filed Weights   08/19/23 0700  Weight: 99.9 kg    Examination: Blood pressure 118/76, pulse 69, temperature 97.6 F (36.4 C), temperature source Axillary, resp. rate (!) 29, weight 99.9 kg, SpO2 98%. Gen:      No acute distress HEENT:  EOMI, sclera anicteric Neck:     No masses; no thyromegaly Lungs:    Clear to auscultation bilaterally; normal respiratory effort CV:         Regular rate and rhythm; no murmurs Abd:      + bowel sounds; soft, non-tender; no palpable masses, no distension Ext:    No edema; adequate  peripheral perfusion Skin:      Warm and dry; no rash Neuro: Sedated  Lab/imaging reviewed Significant for sodium 129, potassium 3.3, BUN/creatinine 12/0.85 AST 90, ALT 72, total bilirubin 1.3 Hemoglobin 11.3, platelets 131  Right upper quadrant ultrasound with distended bladder with stones, slight wall thickening  Resolved Hospital Problem list     Assessment & Plan:  Acute alcohol withdrawal, delirium tremens Continue on CIWA protocol, Precedex drip Start phenobarb taper Multivitamin, thiamine, folic acid  Abdominal pain, possible cholecystitis, mild pancreatitis History of esophagitis General Surgery consulted, HIDA scan today Keep n.p.o. Pain control PPI  Diabetes SSI coverage  Best Practice (right click and "Reselect all SmartList Selections" daily)   Diet/type: NPO DVT prophylaxis: LMWH GI prophylaxis: PPI Lines: N/A Foley:  N/A Code Status:  full code Last date of multidisciplinary goals of care discussion []   Critical care time:    The patient is critically ill with multiple organ system failure and requires high complexity decision making for assessment and support, frequent evaluation and titration of therapies, advanced monitoring, review of radiographic studies and interpretation of complex data.   Critical Care Time devoted to patient care services, exclusive of separately billable procedures, described in this note is 35 minutes.   Chilton Greathouse MD [facility] See Amion for pager  If no response to pager , please call [phone] until 7pm After 7:00 pm call  Elink  [phone] 08/19/2023, 8:14 AM

## 2023-08-19 NOTE — Progress Notes (Signed)
Patient at beginning of shift was Aox4, able to bathe self independently. Patient c/o a little anxiety regarding if he would have to have a procedure. Patient give prn ativan x1 for anxiety. CIWA <5 at beginning of shift. Later in the shift anxiety and agitation increased with increasing doses of Ativan administered. Patient disconnected IV himself and had wandered out of room. Safety sitter placed with patient. NP made aware, x1 dose of 2mg  Ativan order, agitation unresolved. Patient now only oriented to self, hallucinating, hearing things, unable to consistently follow commands, kissing the side rails, and talking to IV pumps in the room. LOC changed by NP and patient transferred to SDU for closer monitoring, communicated with charge RN and left with primary RN after doing bedside report. See flowsheets for more info.

## 2023-08-19 NOTE — TOC Progression Note (Signed)
Transition of Care [facility]) - Progression Note    Patient Details  Name: Richard Lopez MRN: [id number] Date of Birth: 1976-07-17  Transition of Care [facility]) CM/SW Contact  Darleene Cleaver, [place] Phone Number: 08/19/2023, 6:23 PM  Clinical Narrative:     TOC has put substance abuse resources on patient's AVS.  Patient is aware, TOC to continue to follow.  Expected Discharge Plan: Home/Self Care Barriers to Discharge: Continued Medical Work up  Expected Discharge Plan and Services                                               Social Determinants of Health (SDOH) Interventions SDOH Screenings   Food Insecurity: No Food Insecurity (08/17/2023)  Housing: Patient Unable To Answer (08/17/2023)  Transportation Needs: No Transportation Needs (08/17/2023)  Utilities: Not At Risk (08/17/2023)  Depression (PHQ2-9): High Risk (05/21/2023)  Financial Resource Strain: Medium Risk (11/07/2018)  Physical Activity: Unknown (11/07/2018)  Social Connections: Unknown (11/07/2018)  Stress: Stress Concern Present (11/07/2018)  Tobacco Use: Medium Risk (08/18/2023)    Readmission Risk Interventions    04/21/2023    2:37 PM  Readmission Risk Prevention Plan  Transportation Screening Complete  PCP or Specialist Appt within 3-5 Days Complete  HRI or Home Care Consult Complete  Social Work Consult for Recovery Care Planning/Counseling Complete  Palliative Care Screening Not Applicable  Medication Review Oceanographer) Complete

## 2023-08-19 NOTE — Progress Notes (Signed)
Subjective: CC: Patient went into withdraw overnight and now on precedex.  Discussed with TRH in person. Seen with RN.  Afebrile. No tachycardia or hypotension. WBC wnl. Alk phos wnl. AST/ALT/T. Bili downtrending.   Objective: Vital signs in last 24 hours: Temp:  [98.3 F (36.8 C)-98.4 F (36.9 C)] 98.3 F (36.8 C) (10/16 0417) Pulse Rate:  [73-87] 85 (10/16 0638) Resp:  [17-18] 17 (10/16 0638) BP: (115-146)/(72-93) 135/93 (10/16 0638) SpO2:  [97 %-100 %] 97 % (10/16 0417) Weight:  [99.9 kg] 99.9 kg (10/16 0700) Last BM Date : 08/18/23  Intake/Output from previous day: 10/15 0701 - 10/16 0700 In: 3444.2 [P.O.:1342; I.V.:2102.2] Out: 750 [Urine:750] Intake/Output this shift: No intake/output data recorded.  PE: Gen:   Abd: Soft, ND, NT. No rigidity, guarding or grimacing with palpation. +BS  Lab Results:  Recent Labs    08/18/23 0414 08/19/23 0358  WBC 4.6 4.5  HGB 11.5* 11.3*  HCT 33.2* 33.1*  PLT 123* 131*   BMET Recent Labs    08/18/23 0414 08/19/23 0358  NA 129* 129*  K 3.4* 3.3*  CL 95* 95*  CO2 23 22  GLUCOSE 101* 91  BUN 17 12  CREATININE 1.07 0.85  CALCIUM 8.5* 8.5*   PT/INR No results for input(s): "LABPROT", "INR" in the last 72 hours. CMP     Component Value Date/Time   NA 129 (L) 08/19/2023 0358   NA 140 07/16/2020 1553   K 3.3 (L) 08/19/2023 0358   CL 95 (L) 08/19/2023 0358   CO2 22 08/19/2023 0358   GLUCOSE 91 08/19/2023 0358   BUN 12 08/19/2023 0358   BUN 5 (L) 07/16/2020 1553   CREATININE 0.85 08/19/2023 0358   CALCIUM 8.5 (L) 08/19/2023 0358   PROT 7.1 08/19/2023 0358   PROT 7.7 07/16/2020 1553   ALBUMIN 3.9 08/19/2023 0358   ALBUMIN 4.4 07/16/2020 1553   AST 90 (H) 08/19/2023 0358   ALT 72 (H) 08/19/2023 0358   ALKPHOS 63 08/19/2023 0358   BILITOT 1.3 (H) 08/19/2023 0358   BILITOT <0.2 07/16/2020 1553   GFRNONAA >60 08/19/2023 0358   GFRAA 132 07/16/2020 1553   Lipase     Component Value Date/Time   LIPASE  60 (H) 08/18/2023 0414    Studies/Results: NM Hepato W/EF  Result Date: 08/18/2023 CLINICAL DATA:  Cholelithiasis. EXAM: NUCLEAR MEDICINE HEPATOBILIARY IMAGING WITH GALLBLADDER EF TECHNIQUE: Sequential images of the abdomen were obtained out to 60 minutes following intravenous administration of radiopharmaceutical. After oral ingestion of Ensure, gallbladder ejection fraction was determined. At 60 min, normal ejection fraction is greater than 33%. RADIOPHARMACEUTICALS:  5.4 mCi Tc-48m  Choletec IV COMPARISON:  Ultrasound 08/17/2019 FINDINGS: Prompt uptake and biliary excretion of activity by the liver is seen. Gallbladder activity is visualized, consistent with patency of cystic duct. Biliary activity passes into small bowel, consistent with patent common bile duct. Calculated gallbladder ejection fraction is 66%. (Normal gallbladder ejection fraction with Ensure is greater than 33% and less than 80%.) IMPRESSION: Ejection fraction of the gallbladder normal at 66%. No common duct or cystic duct obstruction. Electronically Signed   By: Karen Kays M.D.   On: 08/18/2023 17:37   US Abdomen Limited RUQ (LIVER/GB)  Result Date: 08/17/2023 CLINICAL DATA:  LFT elevation. EXAM: ULTRASOUND ABDOMEN LIMITED RIGHT UPPER QUADRANT COMPARISON:  CT 04/20/2023.  Ultrasound 11/08/2022 FINDINGS: Gallbladder: Distended gallbladder with multiple shadowing stones. Wall thickness of up to 4 mm, slightly thickened. No adjacent fluid. No reported  sonographic Murphy's sign Common bile duct: Diameter: 2 mm Liver: Diffusely echogenic hepatic parenchyma consistent with fatty liver infiltration. Portal vein is patent on color Doppler imaging with normal direction of blood flow towards the liver. Other: None. IMPRESSION: Distended gallbladder with stones. Slight wall thickening. Please correlate for other clinical signs of acute cholecystitis and if needed HIDA scan. No biliary ductal dilatation. Fatty liver infiltration  Electronically Signed   By: Karen Kays M.D.   On: 08/17/2023 09:45    Anti-infectives: Anti-infectives (From admission, onward)    None        Assessment/Plan Cholelithiasis with elevated LFT's   This is a 47 year old male who we are asked to see for abdominal pain and elevated LFTs.  Patient does report history of postprandial nausea and vomiting.  RUQ [place] with cholelithiasis.  There is also very mild gallbladder wall thickening of 4 mm. We were asked to see for possible cholecystitis.   HIDA with no common duct or cystic duct obstruction. No evidence of cholecystitis. EF wnl at 66%. LFT's downtrending. Lipase pending.   He is currently HDS without fever, tachycardia or hypotension. WBC wnl. No peritonitis on exam. HIDA negative for acute cholecystitis. He is currently in alcohol withdraw on precedex. Do not recommend laparoscopic cholecystectomy at this time.    Discussed with primary recommendation for GI consultation for further workup of patients post prandial n/v and elevated lft's. Also consider CT A/P to r/o other etiologies of n/v and lab derangements.   We will follow along peripherally while further w/u is ongoing.   I reviewed nursing notes, last 24 h vitals and pain scores, last 48 h intake and output, last 24 h labs and trends, and last 24 h imaging results.    LOS: 2 days    Jacinto Halim , [facility] 08/19/2023, 8:22 AM Please see Amion for pager number during day hours 7:00am-4:30pm

## 2023-08-19 NOTE — Progress Notes (Signed)
At approximately 0700, patient was verbally aggressive as well as agitated this morning. Patient only oriented to self this morning, and disoriented x 3. Patient's CIWA score >20-showing signs of diaphoresis, tremors, agitation, hallucinations, and delusional thinking, and becoming not redirectable. Kelly tech at beside trying to prevent patient from falling out of bed. Tried to deescalate situation with patient but patient not able to direct or calm down. Patient was becoming more agitated and security was called with [employer] to beside. Also, MD Allena Katz called to beside. MD Allena Katz ordered 5mg  of valium as well as precedex. Both given. MD Allena Katz ordered for precedex to be initiated at 1.2 mcg/hr and placed CCM consult.

## 2023-08-19 NOTE — Progress Notes (Signed)
Patient's girlfriend's number Leverne Humbles)  [phone].

## 2023-08-19 NOTE — Progress Notes (Addendum)
Overnight   NAME: Richard Lopez MRN: [id number] DOB : 05-Feb-1976    Date of Service   08/19/2023   HPI/Events of Note    Notified by RN for increasing CIWA score unrelieved by multiple Ativan doses in succession.  Patient is at a minimum of 2-3 days of no ETOH  Unclear use amount. Has been wandering into other rooms at times     Interventions/ Plan   Transfer to SDU  Additional Ativan doses Consider Precedex      Chinita Greenland BSN MSNA MSN ACNPC-AG Acute Care Nurse Practitioner [facility]

## 2023-08-19 NOTE — Progress Notes (Signed)
eLink Physician-Brief Progress Note Patient Name: Richard Lopez DOB: [birth year] MRN: [id number]   Date of Service  08/19/2023  HPI/Events of Note  Asking for AM labs.  reviewed  eICU Interventions  CBC, CMP ordered     Intervention Category Minor Interventions: Routine modifications to care plan (e.g. PRN medications for pain, fever)  Ranee Gosselin 08/19/2023, 8:55 PM  04:42 potassium 3.4, sodium 128, cret 0.77, GFR >60. Pt is taking po and has PIVs  Kcl ordered

## 2023-08-19 NOTE — Progress Notes (Signed)
Upon turning patient at 1400, patient appears to have a large abscess on left elbow with a small white induration in the center of the wound. Notified MD Allena Katz of the abscess.  Also, talked to patient's Mom and girlfriend in regards to elbow. According to them, patient has been dealing with elbow draining and enlarging over the past year. Patient has also noticed significant drainage in July/August of this year. Per girlfriend, patient followed up with rheumatologist on Oct. 2nd but never received prescription for possible antibiotic.

## 2023-08-19 NOTE — Progress Notes (Signed)
[facility]                                                                              Richard Lopez, is a 47 y.o. male, DOB - 1976/04/17, WUX:[id number] Admit date - 08/17/2023    Outpatient Primary MD for the patient is Randa Evens, Kinnie Scales, NP  LOS - 2  days  Chief Complaint  Patient presents with   Abdominal Pain   Weakness       Brief summary   Patient is a 47 year old male with prior PE, treated, HTN, severe esophagitis, DM, depression, anxiety presented with abdominal pain, nausea vomiting, dehydration.  He has a history of severe erosive esophagitis, esophageal ulcers, gastritis found on EGD 11/2022, was placed on PPI and Carafate.  Patient reported that he takes Protonix, had been in usual state of health until the last couple of weeks when he started having abdominal pain.  It worsened in the last week due to stress (multiple deaths in the family/friends).  He also barely ate in the last week, had a severe worsening epigastric pain, nausea and vomiting.  In the last 24 hours prior to admission, felt diffusely tender.  No melena, hematochezia or hematemesis.  No prior history of pancreatitis. In ED, temp 98.1 F RR 42, pulse 117, BP 161/109 Sodium 131, 16, BUN 20, creatinine 2.18.  Baseline creatinine 1.0 on 05/23/2023  LFTs showed AST 183, ALT 101, lipase 72, total bilirubin 1.7  Assessment & Plan    Principal Problem: Acute abdominal pain, nausea and vomiting likely due to esophagitis, possible acute cholecystitis, mild pancreatitis -Patient has a history of severe erosive esophagitis and gastritis, continue Protonix, Carafate -Placed on IV fluid hydration, tolerated diet this morning -Also noted elevated LFTs, lipase on admission -Right upper quadrant ultrasound showed distended gallbladder with gallstones, slight wall thickening, no biliary duct dilation -General Surgery consulted, n.p.o. plan for HIDA scan today -Continue pain control, IV fluids -LFTs  improving, lipase 72 on admission-> 60  Severe alcohol withdrawal. Potential delirium tremens. Patient becomes severely agitated. Required transfer to the ICU. Received IV Valium without any improvement. Started on Precedex. Shows some improvement. Also started on phenobarb after discussion with ICU. ICU will be following the patient. Monitor for now.    AKI (acute kidney injury) (HCC) -Suspect prerenal due to significant dehydration, poor oral intake in the last 1 week, nausea and vomiting -Continue IV fluid hydration, encourage oral fluid intake -Creatinine 2.18 on admission, baseline 1.0 on 05/23/23 -Continue gentle hydration today, creatinine improving  Mild hyponatremia -Likely due to hypovolemia, poor oral intake, continue gentle hydration  Hypokalemia -Replaced    Type 2 diabetes mellitus, diet controlled -Continue sliding scale insulin while inpatient -Hemoglobin A1c 5.5 on 08/17/2023  Depression, anxiety -Due to recent life stressors, death in the family/friends -TOC consult  Hypertension -Continue amlodipine  Multiple joint swelling. Does not appear to have any active infection. For now we will monitor.  Code Status: Full CODE STATUS DVT Prophylaxis:  enoxaparin (LOVENOX) injection 40 mg Start: 08/17/23 1000 SCDs Start: 08/17/23 0746   Level of Care: Level of care: ICU Family Communication: Updated patient Disposition Plan:  Remains inpatient appropriate:      Procedures:    Consultants:   General surgery  Antimicrobials:   Anti-infectives (From admission, onward)    None          Medications  amLODipine  10 mg Oral Daily   Chlorhexidine Gluconate Cloth  6 each Topical Daily   enoxaparin (LOVENOX) injection  40 mg Subcutaneous Q24H   folic acid  1 mg Oral Daily   insulin aspart  0-15 Units Subcutaneous TID WC   insulin aspart  0-5 Units Subcutaneous QHS   multivitamin with minerals  1 tablet Oral Daily   nicotine  14 mg  Transdermal Once   pantoprazole  40 mg Oral Daily   PHENObarbital  97.5 mg Intravenous Q8H   Followed by   [START ON 08/21/2023] PHENObarbital  65 mg Intravenous Q8H   Followed by   Melene Muller ON 08/23/2023] PHENObarbital  32.5 mg Intravenous Q8H   sucralfate  1 g Oral TID WC & HS   thiamine  100 mg Oral Daily   Or   thiamine  100 mg Intravenous Daily      Subjective:   Agitated early this morning.  No nausea vomiting fever no chills.  Denies any other acute complaint.  Objective:   Vitals:   08/19/23 1400 08/19/23 1500 08/19/23 1600 08/19/23 1700  BP: 115/66 (!) 92/57 99/61 108/73  Pulse: (!) 57 66 65 66  Resp: (!) 21 (!) 25 (!) 30 (!) 30  Temp: (!) 94.8 F (34.9 C) (!) 95.2 F (35.1 C) (!) 96.3 F (35.7 C) (!) 97 F (36.1 C)  TempSrc: Rectal Rectal Rectal   SpO2: 97% 96% 91% 91%  Weight:        Intake/Output Summary (Last 24 hours) at 08/19/2023 1915 Last data filed at 08/19/2023 1400 Gross per 24 hour  Intake 1562.99 ml  Output 750 ml  Net 812.99 ml     Wt Readings from Last 3 Encounters:  08/19/23 99.9 kg  05/21/23 101.3 kg  05/04/23 109.1 kg     Exam Clear to auscultation.  Send S1-S2 present.  Bowel sound present. Multiple joints involving tophaceous gout. Agitated although oriented to self.  Data Reviewed:  I have personally reviewed following labs    CBC Lab Results  Component Value Date   WBC 4.5 08/19/2023   RBC 3.72 (L) 08/19/2023   HGB 11.3 (L) 08/19/2023   HCT 33.1 (L) 08/19/2023   MCV 89.0 08/19/2023   MCH 30.4 08/19/2023   PLT 131 (L) 08/19/2023   MCHC 34.1 08/19/2023   RDW 14.6 08/19/2023   LYMPHSABS 2.5 08/17/2023   MONOABS 1.0 08/17/2023   EOSABS 0.1 08/17/2023   BASOSABS 0.1 08/17/2023     Last metabolic panel Lab Results  Component Value Date   NA 129 (L) 08/19/2023   K 3.3 (L) 08/19/2023   CL 95 (L) 08/19/2023   CO2 22 08/19/2023   BUN 12 08/19/2023   CREATININE 0.85 08/19/2023   GLUCOSE 91 08/19/2023   GFRNONAA  >60 08/19/2023   GFRAA 132 07/16/2020   CALCIUM 8.5 (L) 08/19/2023   PHOS 2.4 (L) 08/19/2023   PROT 7.1 08/19/2023   ALBUMIN 3.9 08/19/2023   LABGLOB 3.3 07/16/2020   AGRATIO 1.3 07/16/2020   BILITOT 1.3 (H) 08/19/2023   ALKPHOS 63 08/19/2023   AST 90 (H) 08/19/2023   ALT 72 (H) 08/19/2023   ANIONGAP 12 08/19/2023    CBG (last 3)  Recent Labs    08/19/23 0835 08/19/23  1210 08/19/23 1642  GLUCAP 113* 130* 79      Coagulation Profile: Recent Labs  Lab 08/19/23 1023  INR 1.1     Radiology Studies: I have personally reviewed the imaging studies  NM Hepato W/EF  Result Date: 08/18/2023 CLINICAL DATA:  Cholelithiasis. EXAM: NUCLEAR MEDICINE HEPATOBILIARY IMAGING WITH GALLBLADDER EF TECHNIQUE: Sequential images of the abdomen were obtained out to 60 minutes following intravenous administration of radiopharmaceutical. After oral ingestion of Ensure, gallbladder ejection fraction was determined. At 60 min, normal ejection fraction is greater than 33%. RADIOPHARMACEUTICALS:  5.4 mCi Tc-69m  Choletec IV COMPARISON:  Ultrasound 08/17/2019 FINDINGS: Prompt uptake and biliary excretion of activity by the liver is seen. Gallbladder activity is visualized, consistent with patency of cystic duct. Biliary activity passes into small bowel, consistent with patent common bile duct. Calculated gallbladder ejection fraction is 66%. (Normal gallbladder ejection fraction with Ensure is greater than 33% and less than 80%.) IMPRESSION: Ejection fraction of the gallbladder normal at 66%. No common duct or cystic duct obstruction. Electronically Signed   By: Karen Kays M.D.   On: 08/18/2023 17:37       Lynden Oxford M.D. [facility] 08/19/2023, 7:15 PM  Available via Epic secure chat 7am-7pm After 7 pm, please refer to night coverage provider listed on amion.

## 2023-08-19 NOTE — Plan of Care (Signed)
  Problem: Education: Goal: Ability to describe self-care measures that may prevent or decrease complications (Diabetes Survival Skills Education) will improve Outcome: Progressing Goal: Individualized Educational Video(s) Outcome: Progressing   Problem: Coping: Goal: Ability to adjust to condition or change in health will improve Outcome: Progressing   Problem: Health Behavior/Discharge Planning: Goal: Ability to identify and utilize available resources and services will improve Outcome: Progressing Goal: Ability to manage health-related needs will improve Outcome: Progressing   Problem: Metabolic: Goal: Ability to maintain appropriate glucose levels will improve Outcome: Progressing   Problem: Nutritional: Goal: Maintenance of adequate nutrition will improve Outcome: Progressing

## 2023-08-20 DIAGNOSIS — K209 Esophagitis, unspecified without bleeding: Secondary | ICD-10-CM | POA: Diagnosis not present

## 2023-08-20 LAB — CBC WITH DIFFERENTIAL/PLATELET
Abs Immature Granulocytes: 0.01 10*3/uL (ref 0.00–0.07)
Basophils Absolute: 0 10*3/uL (ref 0.0–0.1)
Basophils Relative: 1 %
Eosinophils Absolute: 0.1 10*3/uL (ref 0.0–0.5)
Eosinophils Relative: 2 %
HCT: 31.2 % — ABNORMAL LOW (ref 39.0–52.0)
Hemoglobin: 10.5 g/dL — ABNORMAL LOW (ref 13.0–17.0)
Immature Granulocytes: 0 %
Lymphocytes Relative: 45 %
Lymphs Abs: 1.6 10*3/uL (ref 0.7–4.0)
MCH: 30.8 pg (ref 26.0–34.0)
MCHC: 33.7 g/dL (ref 30.0–36.0)
MCV: 91.5 fL (ref 80.0–100.0)
Monocytes Absolute: 0.4 10*3/uL (ref 0.1–1.0)
Monocytes Relative: 12 %
Neutro Abs: 1.5 10*3/uL — ABNORMAL LOW (ref 1.7–7.7)
Neutrophils Relative %: 40 %
Platelets: 121 10*3/uL — ABNORMAL LOW (ref 150–400)
RBC: 3.41 MIL/uL — ABNORMAL LOW (ref 4.22–5.81)
RDW: 14.8 % (ref 11.5–15.5)
WBC: 3.6 10*3/uL — ABNORMAL LOW (ref 4.0–10.5)
nRBC: 0 % (ref 0.0–0.2)

## 2023-08-20 LAB — GLUCOSE, CAPILLARY
Glucose-Capillary: 159 mg/dL — ABNORMAL HIGH (ref 70–99)
Glucose-Capillary: 107 mg/dL — ABNORMAL HIGH (ref 70–99)

## 2023-08-20 LAB — COMPREHENSIVE METABOLIC PANEL
ALT: 74 U/L — ABNORMAL HIGH (ref 0–44)
AST: 98 U/L — ABNORMAL HIGH (ref 15–41)
Albumin: 3.5 g/dL (ref 3.5–5.0)
Alkaline Phosphatase: 54 U/L (ref 38–126)
Anion gap: 11 (ref 5–15)
BUN: 13 mg/dL (ref 6–20)
CO2: 20 mmol/L — ABNORMAL LOW (ref 22–32)
Calcium: 8.3 mg/dL — ABNORMAL LOW (ref 8.9–10.3)
Chloride: 97 mmol/L — ABNORMAL LOW (ref 98–111)
Creatinine, Ser: 0.77 mg/dL (ref 0.61–1.24)
GFR, Estimated: >60 mL/min (ref >60)
Glucose, Bld: 72 mg/dL (ref 70–99)
Potassium: 3.4 mmol/L — ABNORMAL LOW (ref 3.5–5.1)
Sodium: 128 mmol/L — ABNORMAL LOW (ref 135–145)
Total Bilirubin: 1.3 mg/dL — ABNORMAL HIGH (ref 0.3–1.2)
Total Protein: 6.5 g/dL (ref 6.5–8.1)

## 2023-08-20 LAB — PREALBUMIN: Prealbumin: 16 mg/dL — ABNORMAL LOW (ref 18–38)

## 2023-08-20 MED ORDER — GABAPENTIN 300 MG PO CAPS: ORAL_CAPSULE | ORAL | 0 refills | Status: DC

## 2023-08-20 MED ORDER — FOLIC ACID 1 MG PO TABS: 1.0000 mg | ORAL_TABLET | Freq: Every day | ORAL | 0 refills | Status: DC

## 2023-08-20 MED ORDER — VITAMIN B-1 100 MG PO TABS: 100.0000 mg | ORAL_TABLET | Freq: Every day | ORAL | 0 refills | Status: DC

## 2023-08-20 MED ORDER — SUCRALFATE 1 G PO TABS: 1.0000 g | ORAL_TABLET | Freq: Two times a day (BID) | ORAL | 0 refills | Status: DC

## 2023-08-20 MED ORDER — MUSCLE RUB 10-15 % EX CREA: TOPICAL_CREAM | Freq: Four times a day (QID) | CUTANEOUS | Status: DC | PRN

## 2023-08-20 MED ORDER — MUSCLE RUB 10-15 % EX CREA: 1.0000 | TOPICAL_CREAM | Freq: Four times a day (QID) | CUTANEOUS | 0 refills | Status: DC | PRN

## 2023-08-20 MED ORDER — ACETAMINOPHEN 500 MG PO TABS: 500.0000 mg | ORAL_TABLET | Freq: Every day | ORAL | Status: DC | PRN

## 2023-08-20 MED ORDER — PANTOPRAZOLE SODIUM 40 MG PO TBEC: 40.0000 mg | DELAYED_RELEASE_TABLET | Freq: Two times a day (BID) | ORAL | 0 refills | Status: DC

## 2023-08-20 MED ORDER — POTASSIUM CHLORIDE CRYS ER 20 MEQ PO TBCR
40.0000 meq | EXTENDED_RELEASE_TABLET | Freq: Once | ORAL | Status: AC
  Administered 2023-08-20: 40 meq via ORAL
  Filled 2023-08-20: qty 2

## 2023-08-20 MED ORDER — GABAPENTIN 300 MG PO CAPS
300.0000 mg | ORAL_CAPSULE | Freq: Once | ORAL | Status: AC
  Administered 2023-08-20: 300 mg via ORAL
  Filled 2023-08-20: qty 1

## 2023-08-20 MED ORDER — ENSURE ENLIVE PO LIQD: 237.0000 mL | Freq: Three times a day (TID) | ORAL | 0 refills | Status: DC

## 2023-08-20 NOTE — Plan of Care (Signed)
Problem: Education: Goal: Ability to describe self-care measures that may prevent or decrease complications (Diabetes Survival Skills Education) will improve 08/20/2023 1119 by Elmer Bales, RN Outcome: Adequate for Discharge 08/20/2023 1118 by Elmer Bales, RN Outcome: Adequate for Discharge Goal: Individualized Educational Video(s) 08/20/2023 1119 by Elmer Bales, RN Outcome: Adequate for Discharge 08/20/2023 1118 by Elmer Bales, RN Outcome: Adequate for Discharge   Problem: Coping: Goal: Ability to adjust to condition or change in health will improve 08/20/2023 1119 by Elmer Bales, RN Outcome: Adequate for Discharge 08/20/2023 1118 by Elmer Bales, RN Outcome: Adequate for Discharge   Problem: Fluid Volume: Goal: Ability to maintain a balanced intake and output will improve 08/20/2023 1119 by Elmer Bales, RN Outcome: Adequate for Discharge 08/20/2023 1118 by Elmer Bales, RN Outcome: Adequate for Discharge   Problem: Health Behavior/Discharge Planning: Goal: Ability to identify and utilize available resources and services will improve 08/20/2023 1119 by Elmer Bales, RN Outcome: Adequate for Discharge 08/20/2023 1118 by Elmer Bales, RN Outcome: Adequate for Discharge Goal: Ability to manage health-related needs will improve 08/20/2023 1119 by Elmer Bales, RN Outcome: Adequate for Discharge 08/20/2023 1118 by Elmer Bales, RN Outcome: Adequate for Discharge   Problem: Metabolic: Goal: Ability to maintain appropriate glucose levels will improve 08/20/2023 1119 by Elmer Bales, RN Outcome: Adequate for Discharge 08/20/2023 1118 by Elmer Bales, RN Outcome: Adequate for Discharge   Problem: Nutritional: Goal: Maintenance of adequate nutrition will improve 08/20/2023 1119 by Elmer Bales, RN Outcome: Adequate for Discharge 08/20/2023 1118 by Elmer Bales, RN Outcome: Adequate for Discharge Goal: Progress toward achieving an  optimal weight will improve 08/20/2023 1119 by Elmer Bales, RN Outcome: Adequate for Discharge 08/20/2023 1118 by Elmer Bales, RN Outcome: Adequate for Discharge   Problem: Skin Integrity: Goal: Risk for impaired skin integrity will decrease 08/20/2023 1119 by Elmer Bales, RN Outcome: Adequate for Discharge 08/20/2023 1118 by Elmer Bales, RN Outcome: Adequate for Discharge   Problem: Tissue Perfusion: Goal: Adequacy of tissue perfusion will improve 08/20/2023 1119 by Elmer Bales, RN Outcome: Adequate for Discharge 08/20/2023 1118 by Elmer Bales, RN Outcome: Adequate for Discharge   Problem: Education: Goal: Knowledge of General Education information will improve Description: Including pain rating scale, medication(s)/side effects and non-pharmacologic comfort measures 08/20/2023 1119 by Elmer Bales, RN Outcome: Adequate for Discharge 08/20/2023 1118 by Elmer Bales, RN Outcome: Adequate for Discharge   Problem: Health Behavior/Discharge Planning: Goal: Ability to manage health-related needs will improve 08/20/2023 1119 by Elmer Bales, RN Outcome: Adequate for Discharge 08/20/2023 1118 by Elmer Bales, RN Outcome: Adequate for Discharge   Problem: Clinical Measurements: Goal: Ability to maintain clinical measurements within normal limits will improve 08/20/2023 1119 by Elmer Bales, RN Outcome: Adequate for Discharge 08/20/2023 1118 by Elmer Bales, RN Outcome: Adequate for Discharge Goal: Will remain free from infection 08/20/2023 1119 by Elmer Bales, RN Outcome: Adequate for Discharge 08/20/2023 1118 by Elmer Bales, RN Outcome: Adequate for Discharge Goal: Diagnostic test results will improve 08/20/2023 1119 by Elmer Bales, RN Outcome: Adequate for Discharge 08/20/2023 1118 by Elmer Bales, RN Outcome: Adequate for Discharge Goal: Respiratory complications will improve 08/20/2023 1119 by Elmer Bales, RN Outcome:  Adequate for Discharge 08/20/2023 1118 by Elmer Bales, RN Outcome: Adequate for Discharge Goal: Cardiovascular complication will be avoided 08/20/2023 1119 by Elmer Bales, RN Outcome:  Adequate for Discharge 08/20/2023 1118 by Elmer Bales, RN Outcome: Adequate for Discharge   Problem: Activity: Goal: Risk for activity intolerance will decrease 08/20/2023 1119 by Elmer Bales, RN Outcome: Adequate for Discharge 08/20/2023 1118 by Elmer Bales, RN Outcome: Adequate for Discharge   Problem: Nutrition: Goal: Adequate nutrition will be maintained 08/20/2023 1119 by Elmer Bales, RN Outcome: Adequate for Discharge 08/20/2023 1118 by Elmer Bales, RN Outcome: Adequate for Discharge   Problem: Coping: Goal: Level of anxiety will decrease 08/20/2023 1119 by Elmer Bales, RN Outcome: Adequate for Discharge 08/20/2023 1118 by Elmer Bales, RN Outcome: Adequate for Discharge   Problem: Elimination: Goal: Will not experience complications related to bowel motility 08/20/2023 1119 by Elmer Bales, RN Outcome: Adequate for Discharge 08/20/2023 1118 by Elmer Bales, RN Outcome: Adequate for Discharge Goal: Will not experience complications related to urinary retention 08/20/2023 1119 by Elmer Bales, RN Outcome: Adequate for Discharge 08/20/2023 1118 by Elmer Bales, RN Outcome: Adequate for Discharge   Problem: Pain Managment: Goal: General experience of comfort will improve 08/20/2023 1119 by Elmer Bales, RN Outcome: Adequate for Discharge 08/20/2023 1118 by Elmer Bales, RN Outcome: Adequate for Discharge   Problem: Safety: Goal: Ability to remain free from injury will improve 08/20/2023 1119 by Elmer Bales, RN Outcome: Adequate for Discharge 08/20/2023 1118 by Elmer Bales, RN Outcome: Adequate for Discharge   Problem: Skin Integrity: Goal: Risk for impaired skin integrity will decrease 08/20/2023 1119 by Elmer Bales, RN Outcome:  Adequate for Discharge 08/20/2023 1118 by Elmer Bales, RN Outcome: Adequate for Discharge   Problem: Education: Goal: Understanding of post-operative needs will improve 08/20/2023 1119 by Elmer Bales, RN Outcome: Adequate for Discharge 08/20/2023 1118 by Elmer Bales, RN Outcome: Adequate for Discharge Goal: Individualized Educational Video(s) 08/20/2023 1119 by Elmer Bales, RN Outcome: Adequate for Discharge 08/20/2023 1118 by Elmer Bales, RN Outcome: Adequate for Discharge   Problem: Clinical Measurements: Goal: Postoperative complications will be avoided or minimized 08/20/2023 1119 by Elmer Bales, RN Outcome: Adequate for Discharge 08/20/2023 1118 by Elmer Bales, RN Outcome: Adequate for Discharge   Problem: Respiratory: Goal: Will regain and/or maintain adequate ventilation 08/20/2023 1119 by Elmer Bales, RN Outcome: Adequate for Discharge 08/20/2023 1118 by Elmer Bales, RN Outcome: Adequate for Discharge

## 2023-08-20 NOTE — Plan of Care (Signed)
  Problem: Education: Goal: Ability to describe self-care measures that may prevent or decrease complications (Diabetes Survival Skills Education) will improve Outcome: Progressing   Problem: Metabolic: Goal: Ability to maintain appropriate glucose levels will improve Outcome: Progressing   Problem: Skin Integrity: Goal: Risk for impaired skin integrity will decrease Outcome: Progressing   Problem: Coping: Goal: Ability to adjust to condition or change in health will improve Outcome: Not Progressing   Problem: Health Behavior/Discharge Planning: Goal: Ability to identify and utilize available resources and services will improve Outcome: Not Progressing

## 2023-08-20 NOTE — Progress Notes (Signed)
Patient being aggressive with staff, de-escalation attempted with Charge RN as witness on multiple occasions. I reoriented patient about the importance of following the doctors orders. Patient states that he wants to stay in the hospital for treatment, but is yelling, cussing, and being aggressive with staff. Patient is upset because he has to stay in the hospital. I explained to the patient that while we do not suggest for him to leave the hospital we cannot force him to stay. Patient continues being aggressive with staff. Charge RN came into the room and expressed the importance of the patient not being verbally aggressive with staff. A/C called because of patients unwillingness to be polite with staff. Advised staff that if patient continues to be verbally aggressive to walk out of room. He also said he would get unit director to come speak with patient in the AM. Charge nurse called 4th floor to check to see If patients charger was there. Nothing has been found at this moment.

## 2023-08-20 NOTE — TOC Progression Note (Signed)
Transition of Care [facility]) - Progression Note    Patient Details  Name: Richard Lopez MRN: [id number] Date of Birth: [birth year]  Transition [facility]) CM/SW Contact  Beckie Busing, RN Phone Number:[phone]  08/20/2023, 11:37 AM  Clinical Narrative:    Cm received message that patient needs transport assistance. . CM at bedside to deliver bus pass. Bedside nurse asked CM not to go in with bus pass because patient has refused.    Expected Discharge Plan: Home/Self Care Barriers to Discharge: Continued Medical Work up  Expected Discharge Plan and Services         Expected Discharge Date: 08/20/23                                     Social Determinants of Health (SDOH) Interventions SDOH Screenings   Food Insecurity: No Food Insecurity (08/17/2023)  Housing: Patient Unable To Answer (08/17/2023)  Transportation Needs: No Transportation Needs (08/17/2023)  Utilities: Not At Risk (08/17/2023)  Depression (PHQ2-9): High Risk (05/21/2023)  Financial Resource Strain: Medium Risk (11/07/2018)  Physical Activity: Unknown (11/07/2018)  Social Connections: Unknown (11/07/2018)  Stress: Stress Concern Present (11/07/2018)  Tobacco Use: Medium Risk (08/18/2023)    Readmission Risk Interventions    04/21/2023    2:37 PM  Readmission Risk Prevention Plan  Transportation Screening Complete  PCP or Specialist Appt within 3-5 Days Complete  HRI or Home Care Consult Complete  Social Work Consult for Recovery Care Planning/Counseling Complete  Palliative Care Screening Not Applicable  Medication Review Oceanographer) Complete

## 2023-08-24 ENCOUNTER — Inpatient Hospital Stay (HOSPITAL_COMMUNITY)
Admission: EM | Admit: 2023-08-24 | Discharge: 2023-08-28 | DRG: 683 | Disposition: A | Discharge status: Home / Self Care | Payer: Medicaid Other | Attending: Internal Medicine | Admitting: Internal Medicine

## 2023-08-24 ENCOUNTER — Ambulatory Visit (INDEPENDENT_AMBULATORY_CARE_PROVIDER_SITE_OTHER): Payer: Medicaid Other | Admitting: Primary Care

## 2023-08-24 ENCOUNTER — Encounter (HOSPITAL_COMMUNITY): Payer: Self-pay

## 2023-08-24 ENCOUNTER — Other Ambulatory Visit: Payer: Self-pay

## 2023-08-24 ENCOUNTER — Emergency Department (HOSPITAL_COMMUNITY): Payer: Medicaid Other

## 2023-08-24 DIAGNOSIS — K8020 Calculus of gallbladder without cholecystitis without obstruction: Secondary | ICD-10-CM | POA: Diagnosis present

## 2023-08-24 DIAGNOSIS — E871 Hypo-osmolality and hyponatremia: Secondary | ICD-10-CM | POA: Diagnosis present

## 2023-08-24 DIAGNOSIS — E860 Dehydration: Secondary | ICD-10-CM | POA: Diagnosis present

## 2023-08-24 DIAGNOSIS — Z79899 Other long term (current) drug therapy: Secondary | ICD-10-CM

## 2023-08-24 DIAGNOSIS — Z888 Allergy status to other drugs, medicaments and biological substances status: Secondary | ICD-10-CM | POA: Diagnosis not present

## 2023-08-24 DIAGNOSIS — E119 Type 2 diabetes mellitus without complications: Secondary | ICD-10-CM | POA: Diagnosis present

## 2023-08-24 DIAGNOSIS — F1290 Cannabis use, unspecified, uncomplicated: Secondary | ICD-10-CM | POA: Diagnosis present

## 2023-08-24 DIAGNOSIS — Z886 Allergy status to analgesic agent status: Secondary | ICD-10-CM | POA: Diagnosis not present

## 2023-08-24 DIAGNOSIS — Z8249 Family history of ischemic heart disease and other diseases of the circulatory system: Secondary | ICD-10-CM

## 2023-08-24 DIAGNOSIS — N179 Acute kidney failure, unspecified: Secondary | ICD-10-CM | POA: Diagnosis present

## 2023-08-24 DIAGNOSIS — M1A49X1 Other secondary chronic gout, multiple sites, with tophus (tophi): Secondary | ICD-10-CM

## 2023-08-24 DIAGNOSIS — Z833 Family history of diabetes mellitus: Secondary | ICD-10-CM | POA: Diagnosis not present

## 2023-08-24 DIAGNOSIS — Z8711 Personal history of peptic ulcer disease: Secondary | ICD-10-CM

## 2023-08-24 DIAGNOSIS — Z86711 Personal history of pulmonary embolism: Secondary | ICD-10-CM

## 2023-08-24 DIAGNOSIS — I10 Essential (primary) hypertension: Secondary | ICD-10-CM | POA: Diagnosis present

## 2023-08-24 DIAGNOSIS — F17200 Nicotine dependence, unspecified, uncomplicated: Secondary | ICD-10-CM | POA: Diagnosis present

## 2023-08-24 DIAGNOSIS — Z8719 Personal history of other diseases of the digestive system: Secondary | ICD-10-CM

## 2023-08-24 DIAGNOSIS — R7401 Elevation of levels of liver transaminase levels: Secondary | ICD-10-CM | POA: Diagnosis present

## 2023-08-24 DIAGNOSIS — R112 Nausea with vomiting, unspecified: Secondary | ICD-10-CM | POA: Diagnosis not present

## 2023-08-24 DIAGNOSIS — R1116 Cannabis hyperemesis syndrome: Secondary | ICD-10-CM | POA: Diagnosis present

## 2023-08-24 DIAGNOSIS — R1084 Generalized abdominal pain: Principal | ICD-10-CM

## 2023-08-24 LAB — CBC
HCT: 39 % (ref 39.0–52.0)
HCT: 36.8 % — ABNORMAL LOW (ref 39.0–52.0)
Hemoglobin: 13.9 g/dL (ref 13.0–17.0)
Hemoglobin: 12.7 g/dL — ABNORMAL LOW (ref 13.0–17.0)
MCH: 31.2 pg (ref 26.0–34.0)
MCH: 30.8 pg (ref 26.0–34.0)
MCHC: 35.6 g/dL (ref 30.0–36.0)
MCHC: 34.5 g/dL (ref 30.0–36.0)
MCV: 87.6 fL (ref 80.0–100.0)
MCV: 89.1 fL (ref 80.0–100.0)
Platelets: 354 10*3/uL (ref 150–400)
Platelets: 329 10*3/uL (ref 150–400)
RBC: 4.45 MIL/uL (ref 4.22–5.81)
RBC: 4.13 MIL/uL — ABNORMAL LOW (ref 4.22–5.81)
RDW: 13.5 % (ref 11.5–15.5)
RDW: 13.7 % (ref 11.5–15.5)
WBC: 5.9 10*3/uL (ref 4.0–10.5)
WBC: 6 10*3/uL (ref 4.0–10.5)
nRBC: 0 % (ref 0.0–0.2)
nRBC: 0 % (ref 0.0–0.2)

## 2023-08-24 LAB — COMPREHENSIVE METABOLIC PANEL
ALT: 49 U/L — ABNORMAL HIGH (ref 0–44)
AST: 47 U/L — ABNORMAL HIGH (ref 15–41)
Albumin: 4.8 g/dL (ref 3.5–5.0)
Alkaline Phosphatase: 67 U/L (ref 38–126)
Anion gap: 19 — ABNORMAL HIGH (ref 5–15)
BUN: 17 mg/dL (ref 6–20)
CO2: 20 mmol/L — ABNORMAL LOW (ref 22–32)
Calcium: 10.1 mg/dL (ref 8.9–10.3)
Chloride: 87 mmol/L — ABNORMAL LOW (ref 98–111)
Creatinine, Ser: 2.82 mg/dL — ABNORMAL HIGH (ref 0.61–1.24)
GFR, Estimated: 27 mL/min — ABNORMAL LOW (ref >60)
Glucose, Bld: 158 mg/dL — ABNORMAL HIGH (ref 70–99)
Potassium: 3.3 mmol/L — ABNORMAL LOW (ref 3.5–5.1)
Sodium: 126 mmol/L — ABNORMAL LOW (ref 135–145)
Total Bilirubin: 0.8 mg/dL (ref 0.3–1.2)
Total Protein: 9.7 g/dL — ABNORMAL HIGH (ref 6.5–8.1)

## 2023-08-24 LAB — URINALYSIS, W/ REFLEX TO CULTURE (INFECTION SUSPECTED)
Bacteria, UA: NONE SEEN
Glucose, UA: NEGATIVE mg/dL
Hgb urine dipstick: NEGATIVE
Ketones, ur: NEGATIVE mg/dL
Nitrite: NEGATIVE
Protein, ur: 100 mg/dL — AB
Specific Gravity, Urine: 1.024 (ref 1.005–1.030)
pH: 5 (ref 5.0–8.0)

## 2023-08-24 LAB — CREATININE, SERUM
Creatinine, Ser: 2.36 mg/dL — ABNORMAL HIGH (ref 0.61–1.24)
GFR, Estimated: 34 mL/min — ABNORMAL LOW (ref >60)

## 2023-08-24 LAB — TROPONIN I (HIGH SENSITIVITY): Troponin I (High Sensitivity): 20 ng/L — ABNORMAL HIGH (ref <18)

## 2023-08-24 LAB — CREATININE, URINE, RANDOM: Creatinine, Urine: >4000 mg/dL

## 2023-08-24 LAB — SODIUM, URINE, RANDOM: Sodium, Ur: <10 mmol/L

## 2023-08-24 LAB — LIPASE, BLOOD: Lipase: 56 U/L — ABNORMAL HIGH (ref 11–51)

## 2023-08-24 LAB — ETHANOL: Alcohol, Ethyl (B): <10 mg/dL (ref <10)

## 2023-08-24 MED ORDER — LACTATED RINGERS IV SOLN: INTRAVENOUS | Status: AC

## 2023-08-24 MED ORDER — METOCLOPRAMIDE HCL 5 MG/ML IJ SOLN
5.0000 mg | Freq: Once | INTRAMUSCULAR | Status: AC
  Administered 2023-08-24: 5 mg via INTRAVENOUS
  Filled 2023-08-24: qty 2

## 2023-08-24 MED ORDER — HEPARIN SODIUM (PORCINE) 5000 UNIT/ML IJ SOLN
5000.0000 [IU] | Freq: Three times a day (TID) | INTRAMUSCULAR | Status: DC
  Administered 2023-08-25 – 2023-08-28 (×9): 5000 [IU] via SUBCUTANEOUS
  Filled 2023-08-24 (×8): qty 1

## 2023-08-24 MED ORDER — HALOPERIDOL LACTATE 5 MG/ML IJ SOLN: 1.0000 mg | Freq: Four times a day (QID) | INTRAMUSCULAR | Status: DC | PRN

## 2023-08-24 MED ORDER — ACETAMINOPHEN 325 MG PO TABS
650.0000 mg | ORAL_TABLET | Freq: Four times a day (QID) | ORAL | Status: DC | PRN
  Administered 2023-08-26 – 2023-08-27 (×2): 650 mg via ORAL
  Filled 2023-08-24 (×2): qty 2

## 2023-08-24 MED ORDER — HALOPERIDOL LACTATE 5 MG/ML IJ SOLN
1.0000 mg | Freq: Four times a day (QID) | INTRAMUSCULAR | Status: AC | PRN
Start: 2023-08-24 — End: 2023-08-25
  Administered 2023-08-25: 1 mg via INTRAVENOUS
  Filled 2023-08-24: qty 1

## 2023-08-24 MED ORDER — DIPHENHYDRAMINE HCL 50 MG/ML IJ SOLN
12.5000 mg | Freq: Once | INTRAMUSCULAR | Status: AC
  Administered 2023-08-24: 12.5 mg via INTRAVENOUS
  Filled 2023-08-24: qty 1

## 2023-08-24 MED ORDER — PANTOPRAZOLE SODIUM 40 MG IV SOLR
40.0000 mg | Freq: Two times a day (BID) | INTRAVENOUS | Status: DC
  Administered 2023-08-24 – 2023-08-28 (×8): 40 mg via INTRAVENOUS
  Filled 2023-08-24 (×8): qty 10

## 2023-08-24 MED ORDER — SUCRALFATE 1 G PO TABS
1.0000 g | ORAL_TABLET | Freq: Three times a day (TID) | ORAL | Status: DC
Start: 2023-08-24 — End: 2023-08-28
  Administered 2023-08-24 – 2023-08-28 (×13): 1 g via ORAL
  Filled 2023-08-24 (×13): qty 1

## 2023-08-24 MED ORDER — DICLOFENAC SODIUM 1 % EX GEL
2.0000 g | Freq: Four times a day (QID) | CUTANEOUS | Status: DC | PRN
  Administered 2023-08-25 – 2023-08-27 (×3): 2 g via TOPICAL
  Filled 2023-08-24: qty 100

## 2023-08-24 MED ORDER — POLYETHYLENE GLYCOL 3350 17 G PO PACK: 17.0000 g | PACK | Freq: Every day | ORAL | Status: DC | PRN

## 2023-08-24 MED ORDER — AMLODIPINE BESYLATE 10 MG PO TABS
10.0000 mg | ORAL_TABLET | Freq: Every day | ORAL | Status: DC
  Administered 2023-08-24 – 2023-08-28 (×5): 10 mg via ORAL
  Filled 2023-08-24 (×5): qty 1

## 2023-08-24 MED ORDER — LACTATED RINGERS IV BOLUS
1000.0000 mL | Freq: Once | INTRAVENOUS | Status: AC
  Administered 2023-08-24: 1000 mL via INTRAVENOUS

## 2023-08-24 MED ORDER — SODIUM CHLORIDE 0.9% FLUSH
3.0000 mL | Freq: Two times a day (BID) | INTRAVENOUS | Status: DC
  Administered 2023-08-24 – 2023-08-28 (×8): 3 mL via INTRAVENOUS

## 2023-08-24 MED ORDER — ACETAMINOPHEN 650 MG RE SUPP: 650.0000 mg | Freq: Four times a day (QID) | RECTAL | Status: DC | PRN

## 2023-08-24 MED ORDER — ONDANSETRON HCL 4 MG/2ML IJ SOLN
4.0000 mg | Freq: Four times a day (QID) | INTRAMUSCULAR | Status: DC | PRN
  Administered 2023-08-25: 4 mg via INTRAVENOUS
  Filled 2023-08-24: qty 2

## 2023-08-24 MED ORDER — KCL-LACTATED RINGERS-D5W 20 MEQ/L IV SOLN
INTRAVENOUS | Status: AC
  Filled 2023-08-24 (×4): qty 1000

## 2023-08-24 MED ORDER — HYDROMORPHONE HCL 1 MG/ML IJ SOLN
0.5000 mg | Freq: Once | INTRAMUSCULAR | Status: AC
  Administered 2023-08-24: 0.5 mg via INTRAVENOUS
  Filled 2023-08-24: qty 1

## 2023-08-24 MED ORDER — POTASSIUM CHLORIDE 10 MEQ/100ML IV SOLN
10.0000 meq | Freq: Once | INTRAVENOUS | Status: AC
  Administered 2023-08-24: 10 meq via INTRAVENOUS
  Filled 2023-08-24: qty 100

## 2023-08-24 NOTE — ED Provider Notes (Addendum)
[place] EMERGENCY DEPARTMENT AT [facility] Provider Note   CSN: [id number] Arrival date & time: 08/24/23  1112     History  Chief Complaint  Patient presents with   Abdominal Pain   Vomiting    Richard Lopez is a 47 y.o. male.  47 year old male presents with persistent emesis times several days.  Recent hospitalization for same.  Patient states that for the last few days he has had nonbilious emesis.  Denies any fever or chills.  No dark or bloody stools.  Continues to have right upper quadrant and epigastric pain.  Was diagnosed with having possible biliary disease.  Endorses increased weakness at this time.       Home Medications Prior to Admission medications   Medication Sig Start Date End Date Taking? Authorizing Provider  acetaminophen (TYLENOL) 500 MG tablet Take 1 tablet (500 mg total) by mouth daily as needed for mild pain (pain score 1-3), moderate pain (pain score 4-6), fever or headache. 08/20/23   Rolly Salter, MD  amLODipine (NORVASC) 10 MG tablet Take 1 tablet (10 mg total) by mouth daily. 04/23/23 08/17/23  Lorin Glass, MD  feeding supplement (ENSURE ENLIVE / ENSURE PLUS) LIQD Take 237 mLs by mouth 3 (three) times daily between meals. 08/20/23   Rolly Salter, MD  folic acid (FOLVITE) 1 MG tablet Take 1 tablet (1 mg total) by mouth daily. 08/21/23   Rolly Salter, MD  gabapentin (NEURONTIN) 300 MG capsule Day 1: 300 mg orally every 6 hours (1200 mg total daily dose), Day 2: 300 mg orally every 8 hours (900 mg total daily dose), Day 3: 300 mg orally every 12 hours (600 mg total daily dose),, Day 4: 300 mg once at night (300 mg total daily dose) 08/20/23   Rolly Salter, MD  Menthol-Methyl Salicylate (MUSCLE RUB) 10-15 % CREA Apply 1 Application topically 4 (four) times daily as needed for muscle pain. 08/20/23   Rolly Salter, MD  Multiple Vitamin (MULTIVITAMIN WITH MINERALS) TABS tablet Take 1 tablet by mouth daily. 11/16/22   Marguerita Merles  Latif, DO  pantoprazole (PROTONIX) 40 MG tablet Take 1 tablet (40 mg total) by mouth 2 (two) times daily before a meal. 08/20/23   Rolly Salter, MD  sucralfate (CARAFATE) 1 g tablet Take 1 tablet (1 g total) by mouth 2 (two) times daily. 08/20/23 08/19/24  Rolly Salter, MD  thiamine (VITAMIN B-1) 100 MG tablet Take 1 tablet (100 mg total) by mouth daily. 08/21/23   Rolly Salter, MD      Allergies    Ativan [lorazepam] and Nsaids    Review of Systems   Review of Systems  All other systems reviewed and are negative.   Physical Exam Updated Vital Signs BP (!) 139/102 (BP Location: Right Arm)   Pulse (!) 112   Temp 98 F (36.7 C) (Oral)   Resp 18   Ht 1.981 m (6\' 6" )   Wt 99 kg   SpO2 98%   BMI 25.22 kg/m  Physical Exam Vitals and nursing note reviewed.  Constitutional:      General: He is not in acute distress.    Appearance: Normal appearance. He is well-developed. He is not toxic-appearing.  HENT:     Head: Normocephalic and atraumatic.  Eyes:     General: Lids are normal.     Conjunctiva/sclera: Conjunctivae normal.     Pupils: Pupils are equal, round, and reactive to light.  Neck:  Thyroid: No thyroid mass.     Trachea: No tracheal deviation.  Cardiovascular:     Rate and Rhythm: Regular rhythm. Tachycardia present.     Heart sounds: Normal heart sounds. No murmur heard.    No gallop.  Pulmonary:     Effort: Pulmonary effort is normal. No respiratory distress.     Breath sounds: Normal breath sounds. No stridor. No decreased breath sounds, wheezing, rhonchi or rales.  Abdominal:     General: There is no distension.     Palpations: Abdomen is soft.     Tenderness: There is abdominal tenderness in the right upper quadrant, epigastric area and left upper quadrant. There is no rebound.    Musculoskeletal:        General: No tenderness. Normal range of motion.     Cervical back: Normal range of motion and neck supple.  Skin:    General: Skin is warm  and dry.     Findings: No abrasion or rash.  Neurological:     Mental Status: He is alert and oriented to person, place, and time. Mental status is at baseline.     GCS: GCS eye subscore is 4. GCS verbal subscore is 5. GCS motor subscore is 6.     Cranial Nerves: Cranial nerves are intact. No cranial nerve deficit.     Sensory: No sensory deficit.     Motor: Motor function is intact.  Psychiatric:        Attention and Perception: Attention normal.        Speech: Speech normal.        Behavior: Behavior normal.     ED Results / Procedures / Treatments   Labs (all labs ordered are listed, but only abnormal results are displayed) Labs Reviewed  LIPASE, BLOOD  COMPREHENSIVE METABOLIC PANEL  CBC  URINALYSIS, ROUTINE W REFLEX MICROSCOPIC  ETHANOL  URINALYSIS, W/ REFLEX TO CULTURE (INFECTION SUSPECTED)    EKG None  Radiology No results found.  Procedures Procedures    Medications Ordered in ED Medications  lactated ringers infusion (has no administration in time range)  lactated ringers bolus 1,000 mL (has no administration in time range)    ED Course/ Medical Decision Making/ A&P                                 Medical Decision Making Amount and/or Complexity of Data Reviewed Labs: ordered. Radiology: ordered.  Risk Prescription drug management.  Patient given IV fluids here as well as pain medication antiemetics.  Electrolytes significant for acute kidney injury with a creatinine of 2.8.  Due to patient's abdominal pain, he had abdominal CT which did not show any acute findings.  Gallstones also noted as before but without evidence of biliary dilatation.  Patient's lipase not significant elevated.  Acute kidney injury likely from dehydration.  Patient will require inpatient hospitalization.          Final Clinical Impression(s) / ED Diagnoses Final diagnoses:  None    Rx / DC Orders ED Discharge Orders     None         Lorre Nick,  MD 08/24/23 1651    Lorre Nick, MD 08/24/23 1652

## 2023-08-24 NOTE — ED Notes (Signed)
ED TO INPATIENT HANDOFF REPORT  ED Nurse Name and Phone #: Suann Larry Name/Age/Gender Richard Lopez 47 y.o. male Room/Bed: WA13/WA13  Code Status   Code Status: Full Code  Home/SNF/Other Home Patient oriented to: self, place, time, and situation Is this baseline? Yes   Triage Complete: Triage complete  Chief Complaint AKI (acute kidney injury) (HCC) [N17.9]  Triage Note Pt arrived reporting vomiting since yesterday, unable to keep anything down. Also states body cramping all over. Endorses last week was d/c on Thursday. Prior to d/c was told he needs to have gallbladder removed due to stones. However, no follow up was initiated. No other symptoms reports   Allergies Allergies  Allergen Reactions   Ativan [Lorazepam] Anxiety and Other (See Comments)    Hallucinations   Nsaids Other (See Comments)    Hx of esophageal ulcer    Level of Care/Admitting Diagnosis ED Disposition     ED Disposition  Admit   Condition  --   Comment  Hospital Area: [facility] [[id number]]  Level of Care: Telemetry [5]  Admit to tele based on following criteria: Monitor QTC interval  May admit patient to [facility] or [facility] if equivalent level of care is available:: No  Covid Evaluation: Asymptomatic - no recent exposure (last 10 days) testing not required  Diagnosis: AKI (acute kidney injury) [facility]) [[id number]]  Admitting Physician: Nolberto Hanlon [[id number]]  Attending Physician: Nolberto Hanlon [[id number]]  Certification:: I certify this patient will need inpatient services for at least 2 midnights          B Medical/Surgery History Past Medical History:  Diagnosis Date   Anxiety    Arthritis    hands and possibly knee   Depression    Diabetes mellitus    diet controlled   Essential hypertension    Gastritis    Gout    Normocytic anemia due to blood loss 08/21/2019   Pulmonary embolism [facility])    Past Surgical History:  Procedure Laterality Date   BIOPSY   11/12/2022   Procedure: BIOPSY;  Surgeon: Kerin Salen, MD;  Location: [facility];  Service: Gastroenterology;;   ESOPHAGOGASTRODUODENOSCOPY (EGD) WITH PROPOFOL N/A 08/14/2019   Procedure: ESOPHAGOGASTRODUODENOSCOPY (EGD) WITH PROPOFOL;  Surgeon: Charlott Rakes, MD;  Location: [facility];  Service: Endoscopy;  Laterality: N/A;   ESOPHAGOGASTRODUODENOSCOPY (EGD) WITH PROPOFOL N/A 11/12/2022   Procedure: ESOPHAGOGASTRODUODENOSCOPY (EGD) WITH PROPOFOL;  Surgeon: Kerin Salen, MD;  Location: [facility];  Service: Gastroenterology;  Laterality: N/A;   EXTERNAL FIXATION LEG Left 11/07/2018   Procedure: EXTERNAL FIXATION LEFT LOWER LEG;  Surgeon: Samson Frederic, MD;  Location: [facility];  Service: Orthopedics;  Laterality: Left;   EXTERNAL FIXATION REMOVAL Left 11/08/2018   Procedure: REMOVAL EXTERNAL FIXATION LEG;  Surgeon: Roby Lofts, MD;  Location: [facility] OR;  Service: Orthopedics;  Laterality: Left;   INTRAMEDULLARY (IM) NAIL INTERTROCHANTERIC Right 03/26/2019   Procedure: INTRAMEDULLARY (IM) NAIL INTERTROCHANTRIC;  Surgeon: Roby Lofts, MD;  Location: [facility] OR;  Service: Orthopedics;  Laterality: Right;   INTRAMEDULLARY (IM) NAIL INTERTROCHANTERIC Right 05/17/2019   Procedure: Intramedullary (Im) Nail Intertroch with circlage wiring;  Surgeon: Myrene Galas, MD;  Location: [facility] OR;  Service: Orthopedics;  Laterality: Right;   NO PAST SURGERIES     OPEN REDUCTION INTERNAL FIXATION (ORIF) TIBIA/FIBULA FRACTURE Left 11/08/2018   Procedure: OPEN REDUCTION INTERNAL FIXATION (ORIF) TIBIA/FIBULA FRACTURE;  Surgeon: Roby Lofts, MD;  Location: [facility] OR;  Service: Orthopedics;  Laterality: Left;   ORIF FEMUR  FRACTURE Right 05/17/2019   Procedure: REMOVAL  OF HARDWARE;  Surgeon: Myrene Galas, MD;  Location: [facility] OR;  Service: Orthopedics;  Laterality: Right;     A IV Location/Drains/Wounds Patient Lines/Drains/Airways Status     Active Line/Drains/Airways     Name Placement date Placement time Site Days    Peripheral IV 08/24/23 20 G Anterior;Right Forearm 08/24/23  1246  Forearm  less than 1   Wound / Incision (Open or Dehisced) 08/19/23 Other (Comment) Elbow Left;Posterior Abscess-fluid filed with pus induration 08/19/23  1400  Elbow  5            Intake/Output Last 24 hours No intake or output data in the 24 hours ending 08/24/23 1902  Labs/Imaging Results for orders placed or performed during the hospital encounter of 08/24/23 (from the past 48 hour(s))  Lipase, blood     Status: Abnormal   Collection Time: 08/24/23 11:30 AM  Result Value Ref Range   Lipase 56 (H) 11 - 51 U/L    Comment: Performed at [facility], [address], [place], [place] [address]  Comprehensive metabolic panel     Status: Abnormal   Collection Time: 08/24/23 11:30 AM  Result Value Ref Range   Sodium 126 (L) 135 - 145 mmol/L   Potassium 3.3 (L) 3.5 - 5.1 mmol/L   Chloride 87 (L) 98 - 111 mmol/L   CO2 20 (L) 22 - 32 mmol/L   Glucose, Bld 158 (H) 70 - 99 mg/dL    Comment: Glucose reference range applies only to samples taken after fasting for at least 8 hours.   BUN 17 6 - 20 mg/dL   Creatinine, Ser 5.78 (H) 0.61 - 1.24 mg/dL   Calcium 46.9 8.9 - 62.9 mg/dL   Total Protein 9.7 (H) 6.5 - 8.1 g/dL   Albumin 4.8 3.5 - 5.0 g/dL   AST 47 (H) 15 - 41 U/L   ALT 49 (H) 0 - 44 U/L   Alkaline Phosphatase 67 38 - 126 U/L   Total Bilirubin 0.8 0.3 - 1.2 mg/dL   GFR, Estimated 27 (L) >60 mL/min    Comment: (NOTE) Calculated using the CKD-EPI Creatinine Equation (2021)    Anion gap 19 (H) 5 - 15    Comment: Performed at [facility], [address], [place], [place] [address]  CBC     Status: None   Collection Time: 08/24/23 11:30 AM  Result Value Ref Range   WBC 5.9 4.0 - 10.5 K/uL    Comment: WHITE COUNT CONFIRMED ON SMEAR   RBC 4.45 4.22 - 5.81 MIL/uL   Hemoglobin 13.9 13.0 - 17.0 g/dL   HCT 32.4 40.1 - 02.7 %   MCV 87.6 80.0 - 100.0 fL   MCH 31.2 26.0 - 34.0  pg   MCHC 35.6 30.0 - 36.0 g/dL   RDW 25.3 66.4 - 40.3 %   Platelets 354 150 - 400 K/uL   nRBC 0.0 0.0 - 0.2 %    Comment: Performed at [facility], [address], [place], [place] [address]  Ethanol     Status: None   Collection Time: 08/24/23 12:13 PM  Result Value Ref Range   Alcohol, Ethyl (B) <10 <10 mg/dL    Comment: (NOTE) Lowest detectable limit for serum alcohol is 10 mg/dL.  For medical purposes only. Performed at [facility], [address] [place], [place] [address]   Urinalysis, w/ Reflex to Culture (Infection Suspected) -Urine,  Clean Catch     Status: Abnormal   Collection Time: 08/24/23  4:47 PM  Result Value Ref Range   Specimen Source URINE, CLEAN CATCH    Color, Urine AMBER (A) YELLOW    Comment: BIOCHEMICALS MAY BE AFFECTED BY COLOR   APPearance CLOUDY (A) CLEAR   Specific Gravity, Urine 1.024 1.005 - 1.030   pH 5.0 5.0 - 8.0   Glucose, UA NEGATIVE NEGATIVE mg/dL   Hgb urine dipstick NEGATIVE NEGATIVE   Bilirubin Urine SMALL (A) NEGATIVE   Ketones, ur NEGATIVE NEGATIVE mg/dL   Protein, ur 295 (A) NEGATIVE mg/dL   Nitrite NEGATIVE NEGATIVE   Leukocytes,Ua TRACE (A) NEGATIVE   RBC / HPF 0-5 0 - 5 RBC/hpf   WBC, UA 11-20 0 - 5 WBC/hpf    Comment:        Reflex urine culture not performed if WBC <=10, OR if Squamous epithelial cells >5. If Squamous epithelial cells >5 suggest recollection.    Bacteria, UA NONE SEEN NONE SEEN   Squamous Epithelial / HPF 0-5 0 - 5 /HPF   Mucus PRESENT    Hyaline Casts, UA PRESENT     Comment: Performed at [facility], [address], [place], [place] [address]   CT ABDOMEN PELVIS WO CONTRAST  Result Date: 08/24/2023 CLINICAL DATA:  Abdomen pain vomiting EXAM: CT ABDOMEN AND PELVIS WITHOUT CONTRAST TECHNIQUE: Multidetector CT imaging of the abdomen and pelvis was performed following the standard protocol without IV contrast. RADIATION DOSE REDUCTION: This exam  was performed according to the departmental dose-optimization program which includes automated exposure control, adjustment of the mA and/or kV according to patient size and/or use of iterative reconstruction technique. COMPARISON:  08/18/2023 nuclear medicine exam, ultrasound 08/17/2023, CT 04/20/2023 FINDINGS: Lower chest: Lung bases demonstrate scarring and cysts at the posterior right base as before. Stable punctate 2 mm anterior right lung base pulmonary nodule, no imaging follow-up is recommended. Coronary vascular calcification. Hepatobiliary: Gallstones.  No biliary dilatation Pancreas: Unremarkable. No pancreatic ductal dilatation or surrounding inflammatory changes. Spleen: Normal in size without focal abnormality. Adrenals/Urinary Tract: Adrenal glands are normal. Kidneys show no hydronephrosis. The bladder is unremarkable Stomach/Bowel: Stomach is within normal limits. Appendix appears normal. No evidence of bowel wall thickening, distention, or inflammatory changes. Vascular/Lymphatic: Moderate aortic atherosclerosis. No aneurysm. No suspicious lymph nodes Reproductive: Prostate is unremarkable. Other: Negative for pelvic effusion or free air. Musculoskeletal: Hardware in the right femur with artifact. No acute or suspicious osseous abnormality. IMPRESSION: 1. No CT evidence for acute intra-abdominal or pelvic abnormality. 2. Gallstones. 3. Aortic atherosclerosis. Aortic Atherosclerosis (ICD10-I70.0). Electronically Signed   By: Jasmine Pang M.D.   On: 08/24/2023 16:45    Pending Labs Unresulted Labs (From admission, onward)     Start     Ordered   08/25/23 0500  APTT  Tomorrow morning,   R        08/24/23 1835   08/25/23 0500  Protime-INR  Tomorrow morning,   R        08/24/23 1835   08/25/23 0500  Basic metabolic panel  Tomorrow morning,   R        08/24/23 1835   08/25/23 0500  CBC  Tomorrow morning,   R        08/24/23 1835   08/24/23 1835  CBC  (heparin)  Once,   R       Comments:  Baseline for heparin therapy IF NOT ALREADY DRAWN.  Notify MD if  PLT < 100 K.    08/24/23 1835   08/24/23 1835  Creatinine, serum  (heparin)  Once,   R       Comments: Baseline for heparin therapy IF NOT ALREADY DRAWN.    08/24/23 1835   08/24/23 1829  Creatinine, urine, random  Add-on,   AD        08/24/23 1828   08/24/23 1827  Sodium, urine, random  Add-on,   AD        08/24/23 1826   08/24/23 1647  Urine Culture  Once,   R        08/24/23 1647            Vitals/Pain Today's Vitals   08/24/23 1700 08/24/23 1800 08/24/23 1814 08/24/23 1820  BP: (!) 147/103 (!) 142/125  (!) 162/104  Pulse: 86 86  83  Resp: 17 11  11   Temp:   98.2 F (36.8 C)   TempSrc:      SpO2: 100% 100%  100%  Weight:      Height:      PainSc:        Isolation Precautions No active isolations  Medications Medications  lactated ringers infusion ( Intravenous New Bag/Given 08/24/23 1428)  dextrose 5% in lactated ringers with KCl 20 mEq/L infusion (has no administration in time range)  acetaminophen (TYLENOL) tablet 650 mg (has no administration in time range)    Or  acetaminophen (TYLENOL) suppository 650 mg (has no administration in time range)  polyethylene glycol (MIRALAX / GLYCOLAX) packet 17 g (has no administration in time range)  heparin injection 5,000 Units (has no administration in time range)  sodium chloride flush (NS) 0.9 % injection 3 mL (has no administration in time range)  ondansetron (ZOFRAN) injection 4 mg (has no administration in time range)  haloperidol lactate (HALDOL) injection 1 mg (has no administration in time range)  amLODipine (NORVASC) tablet 10 mg (has no administration in time range)  pantoprazole (PROTONIX) injection 40 mg (has no administration in time range)  sucralfate (CARAFATE) tablet 1 g (has no administration in time range)  lactated ringers bolus 1,000 mL (0 mLs Intravenous Stopped 08/24/23 1424)  HYDROmorphone (DILAUDID) injection 0.5 mg (0.5 mg  Intravenous Given 08/24/23 1301)  metoCLOPramide (REGLAN) injection 5 mg (5 mg Intravenous Given 08/24/23 1304)  diphenhydrAMINE (BENADRYL) injection 12.5 mg (12.5 mg Intravenous Given 08/24/23 1305)  potassium chloride 10 mEq in 100 mL IVPB (0 mEq Intravenous Stopped 08/24/23 1650)    Mobility walks with person assist     Focused Assessments Abdominal Pain   R Recommendations: See Admitting Provider Note  Report given to:   Additional Notes: .

## 2023-08-24 NOTE — H&P (Addendum)
History and Physical    Patient: Richard Lopez UEA:[id number] DOB: Sep 29, 1976 DOA: 08/24/2023 DOS: the patient was seen and examined on 08/24/2023 PCP: [name], NP  Patient coming from: Home  Chief Complaint:  Chief Complaint  Patient presents with   Abdominal Pain   Vomiting   HPI: Richard Lopez is a 47 y.o. male with medical history significant of prior PE, treated, HTN, severe esophagitis, DM, depression. Was discharged from [facility] on 08/20/2023 after Rx for  abdominal pain, nausea vomiting, dehydration.  On the day of discharge, patient has been n.p.o. for anticipated cholecystectomy, this was not ultimately deferred after his HIDA scan came back negative and surgery opined that a  cholecystectomy was not needed.   However on the day of discharge, patient apparently received some benzodiazepine which caused confusion.  And after returning home patient did not really engage in any activities and was in bed pretty much all day.  Starting couple of days after discharge on Saturday, patient reports that attempts to eat were followed by vomiting within an hour or so with copious amounts of clear gastric fluid.  Patient's attempt to drink fluids were also limited as he was having nausea and would occasionally throw that up.  Therefore patient has for all practical purposes not been eating or drinking much since his discharge, is reporting a generalized abdominal discomfort no fever no diarrhea.  And multiple episodes of vomiting typically associated with attempts to eat or drink.  Patient has smoked both tobacco as well as marijuana since discharge.  There is no history of any fever no presyncope loss of consciousness.  Patient did noticed that he was not urinating as much.  Patient finally came to the ER because of not getting better in terms of his symptoms as above.  ER workup is notable for patient found to have acute kidney injury compared to baseline labs from a few days  ago.  Wife is at the bedside, medical evaluation is sought  Review of Systems: As mentioned in the history of present illness. All other systems reviewed and are negative. Past Medical History:  Diagnosis Date   Anxiety    Arthritis    hands and possibly knee   Depression    Diabetes mellitus    diet controlled   Essential hypertension    Gastritis    Gout    Normocytic anemia due to blood loss 08/21/2019   Pulmonary embolism [facility])    Past Surgical History:  Procedure Laterality Date   BIOPSY  11/12/2022   Procedure: BIOPSY;  Surgeon: Kerin Salen, MD;  Location: [facility];  Service: Gastroenterology;;   ESOPHAGOGASTRODUODENOSCOPY (EGD) WITH PROPOFOL N/A 08/14/2019   Procedure: ESOPHAGOGASTRODUODENOSCOPY (EGD) WITH PROPOFOL;  Surgeon: Charlott Rakes, MD;  Location: [facility];  Service: Endoscopy;  Laterality: N/A;   ESOPHAGOGASTRODUODENOSCOPY (EGD) WITH PROPOFOL N/A 11/12/2022   Procedure: ESOPHAGOGASTRODUODENOSCOPY (EGD) WITH PROPOFOL;  Surgeon: Kerin Salen, MD;  Location: [facility];  Service: Gastroenterology;  Laterality: N/A;   EXTERNAL FIXATION LEG Left 11/07/2018   Procedure: EXTERNAL FIXATION LEFT LOWER LEG;  Surgeon: Samson Frederic, MD;  Location: [facility];  Service: Orthopedics;  Laterality: Left;   EXTERNAL FIXATION REMOVAL Left 11/08/2018   Procedure: REMOVAL EXTERNAL FIXATION LEG;  Surgeon: Roby Lofts, MD;  Location: [facility] OR;  Service: Orthopedics;  Laterality: Left;   INTRAMEDULLARY (IM) NAIL INTERTROCHANTERIC Right 03/26/2019   Procedure: INTRAMEDULLARY (IM) NAIL INTERTROCHANTRIC;  Surgeon: Roby Lofts, MD;  Location: [facility] OR;  Service:  Orthopedics;  Laterality: Right;   INTRAMEDULLARY (IM) NAIL INTERTROCHANTERIC Right 05/17/2019   Procedure: Intramedullary (Im) Nail Intertroch with circlage wiring;  Surgeon: Myrene Galas, MD;  Location: [facility] OR;  Service: Orthopedics;  Laterality: Right;   NO PAST SURGERIES     OPEN REDUCTION INTERNAL FIXATION (ORIF)  TIBIA/FIBULA FRACTURE Left 11/08/2018   Procedure: OPEN REDUCTION INTERNAL FIXATION (ORIF) TIBIA/FIBULA FRACTURE;  Surgeon: Roby Lofts, MD;  Location: [facility];  Service: Orthopedics;  Laterality: Left;   ORIF FEMUR FRACTURE Right 05/17/2019   Procedure: REMOVAL  OF HARDWARE;  Surgeon: Myrene Galas, MD;  Location: [facility] OR;  Service: Orthopedics;  Laterality: Right;   Social History:  reports that he has quit smoking. His smoking use included cigarettes. He has never used smokeless tobacco. He reports current alcohol use of about 200.0 standard drinks of alcohol per week. He reports current drug use. Drug: Marijuana.  Allergies  Allergen Reactions   Ativan [Lorazepam] Anxiety    Gives patient hallucinations   Nsaids     Hx of esophageal ulcer    Family History  Problem Relation Age of Onset   Diabetes Mellitus II Father    Diabetes Mellitus II Other    CAD Other     Prior to Admission medications   Medication Sig Start Date End Date Taking? Authorizing Provider  acetaminophen (TYLENOL) 500 MG tablet Take 1 tablet (500 mg total) by mouth daily as needed for mild pain (pain score 1-3), moderate pain (pain score 4-6), fever or headache. 08/20/23   Rolly Salter, MD  amLODipine (NORVASC) 10 MG tablet Take 1 tablet (10 mg total) by mouth daily. 04/23/23 08/24/23  Lorin Glass, MD  feeding supplement (ENSURE ENLIVE / ENSURE PLUS) LIQD Take 237 mLs by mouth 3 (three) times daily between meals. 08/20/23   Rolly Salter, MD  folic acid (FOLVITE) 1 MG tablet Take 1 tablet (1 mg total) by mouth daily. 08/21/23   Rolly Salter, MD  gabapentin (NEURONTIN) 300 MG capsule Day 1: 300 mg orally every 6 hours (1200 mg total daily dose), Day 2: 300 mg orally every 8 hours (900 mg total daily dose), Day 3: 300 mg orally every 12 hours (600 mg total daily dose),, Day 4: 300 mg once at night (300 mg total daily dose) 08/20/23   Rolly Salter, MD  Menthol-Methyl Salicylate (MUSCLE RUB) 10-15 % CREA Apply  1 Application topically 4 (four) times daily as needed for muscle pain. 08/20/23   Rolly Salter, MD  Multiple Vitamin (MULTIVITAMIN WITH MINERALS) TABS tablet Take 1 tablet by mouth daily. 11/16/22   Marguerita Merles Latif, DO  pantoprazole (PROTONIX) 40 MG tablet Take 1 tablet (40 mg total) by mouth 2 (two) times daily before a meal. 08/20/23   Rolly Salter, MD  sucralfate (CARAFATE) 1 g tablet Take 1 tablet (1 g total) by mouth 2 (two) times daily. 08/20/23 08/19/24  Rolly Salter, MD  thiamine (VITAMIN B-1) 100 MG tablet Take 1 tablet (100 mg total) by mouth daily. 08/21/23   Rolly Salter, MD    Physical Exam: Vitals:   08/24/23 1430 08/24/23 1700 08/24/23 1800 08/24/23 1814  BP:  (!) 147/103 (!) 142/125   Pulse:  86 86   Resp:  17 11   Temp: 98.4 F (36.9 C)   98.2 F (36.8 C)  TempSrc: Oral     SpO2:  100% 100%   Weight:      Height:  General: Patient is alert and awake, gives a coherent account of his symptoms, does not appear to be immediately distressed. Respiratory exam: Bilateral intravesicular Cardiovascular exam S1-S2 normal Abdomen all quadrants are soft nontender Extremities warm without edema Wife at the bedside Data Reviewed:  Labs on Admission:  Results for orders placed or performed during the hospital encounter of 08/24/23 (from the past 24 hour(s))  Lipase, blood     Status: Abnormal   Collection Time: 08/24/23 11:30 AM  Result Value Ref Range   Lipase 56 (H) 11 - 51 U/L  Comprehensive metabolic panel     Status: Abnormal   Collection Time: 08/24/23 11:30 AM  Result Value Ref Range   Sodium 126 (L) 135 - 145 mmol/L   Potassium 3.3 (L) 3.5 - 5.1 mmol/L   Chloride 87 (L) 98 - 111 mmol/L   CO2 20 (L) 22 - 32 mmol/L   Glucose, Bld 158 (H) 70 - 99 mg/dL   BUN 17 6 - 20 mg/dL   Creatinine, Ser 6.64 (H) 0.61 - 1.24 mg/dL   Calcium 40.3 8.9 - 47.4 mg/dL   Total Protein 9.7 (H) 6.5 - 8.1 g/dL   Albumin 4.8 3.5 - 5.0 g/dL   AST 47 (H) 15 - 41 U/L    ALT 49 (H) 0 - 44 U/L   Alkaline Phosphatase 67 38 - 126 U/L   Total Bilirubin 0.8 0.3 - 1.2 mg/dL   GFR, Estimated 27 (L) >60 mL/min   Anion gap 19 (H) 5 - 15  CBC     Status: None   Collection Time: 08/24/23 11:30 AM  Result Value Ref Range   WBC 5.9 4.0 - 10.5 K/uL   RBC 4.45 4.22 - 5.81 MIL/uL   Hemoglobin 13.9 13.0 - 17.0 g/dL   HCT 25.9 56.3 - 87.5 %   MCV 87.6 80.0 - 100.0 fL   MCH 31.2 26.0 - 34.0 pg   MCHC 35.6 30.0 - 36.0 g/dL   RDW 64.3 32.9 - 51.8 %   Platelets 354 150 - 400 K/uL   nRBC 0.0 0.0 - 0.2 %  Ethanol     Status: None   Collection Time: 08/24/23 12:13 PM  Result Value Ref Range   Alcohol, Ethyl (B) <10 <10 mg/dL  Urinalysis, w/ Reflex to Culture (Infection Suspected) -Urine, Clean Catch     Status: Abnormal   Collection Time: 08/24/23  4:47 PM  Result Value Ref Range   Specimen Source URINE, CLEAN CATCH    Color, Urine AMBER (A) YELLOW   APPearance CLOUDY (A) CLEAR   Specific Gravity, Urine 1.024 1.005 - 1.030   pH 5.0 5.0 - 8.0   Glucose, UA NEGATIVE NEGATIVE mg/dL   Hgb urine dipstick NEGATIVE NEGATIVE   Bilirubin Urine SMALL (A) NEGATIVE   Ketones, ur NEGATIVE NEGATIVE mg/dL   Protein, ur 841 (A) NEGATIVE mg/dL   Nitrite NEGATIVE NEGATIVE   Leukocytes,Ua TRACE (A) NEGATIVE   RBC / HPF 0-5 0 - 5 RBC/hpf   WBC, UA 11-20 0 - 5 WBC/hpf   Bacteria, UA NONE SEEN NONE SEEN   Squamous Epithelial / HPF 0-5 0 - 5 /HPF   Mucus PRESENT    Hyaline Casts, UA PRESENT    Basic Metabolic Panel: Recent Labs  Lab 08/18/23 0414 08/19/23 0358 08/19/23 2133 08/20/23 0018 08/24/23 1130  NA 129* 129* 131* 128* 126*  K 3.4* 3.3* 3.4* 3.4* 3.3*  CL 95* 95* 99 97* 87*  CO2 23 22 22  20*  20*  GLUCOSE 101* 91 80 72 158*  BUN 17 12 14 13 17   CREATININE 1.07 0.85 0.75 0.77 2.82*  CALCIUM 8.5* 8.5* 8.5* 8.3* 10.1  PHOS  --  2.4*  --   --   --    Liver Function Tests: Recent Labs  Lab 08/18/23 0414 08/19/23 0358 08/19/23 2133 08/20/23 0018 08/24/23 1130   AST 108* 90* 105* 98* 47*  ALT 76* 72* 76* 74* 49*  ALKPHOS 61 63 55 54 67  BILITOT 1.9* 1.3* 1.2 1.3* 0.8  PROT 7.5 7.1 6.6 6.5 9.7*  ALBUMIN 3.8 3.9 3.6 3.5 4.8   Recent Labs  Lab 08/18/23 0414 08/19/23 0358 08/24/23 1130  LIPASE 60* 43 56*   Recent Labs  Lab 08/19/23 1023  AMMONIA 31   CBC: Recent Labs  Lab 08/18/23 0414 08/19/23 0358 08/20/23 0018 08/24/23 1130  WBC 4.6 4.5 3.6* 5.9  NEUTROABS  --   --  1.5*  --   HGB 11.5* 11.3* 10.5* 13.9  HCT 33.2* 33.1* 31.2* 39.0  MCV 88.1 89.0 91.5 87.6  PLT 123* 131* 121* 354   Cardiac Enzymes: No results for input(s): "CKTOTAL", "CKMB", "CKMBINDEX", "TROPONINIHS" in the last 168 hours.  BNP (last 3 results) No results for input(s): "PROBNP" in the last 8760 hours. CBG: Recent Labs  Lab 08/19/23 1210 08/19/23 1642 08/19/23 2155 08/20/23 0753 08/20/23 1135  GLUCAP 130* 79 86 159* 107*    Radiological Exams on Admission:  CT ABDOMEN PELVIS WO CONTRAST  Result Date: 08/24/2023 CLINICAL DATA:  Abdomen pain vomiting EXAM: CT ABDOMEN AND PELVIS WITHOUT CONTRAST TECHNIQUE: Multidetector CT imaging of the abdomen and pelvis was performed following the standard protocol without IV contrast. RADIATION DOSE REDUCTION: This exam was performed according to the departmental dose-optimization program which includes automated exposure control, adjustment of the mA and/or kV according to patient size and/or use of iterative reconstruction technique. COMPARISON:  08/18/2023 nuclear medicine exam, ultrasound 08/17/2023, CT 04/20/2023 FINDINGS: Lower chest: Lung bases demonstrate scarring and cysts at the posterior right base as before. Stable punctate 2 mm anterior right lung base pulmonary nodule, no imaging follow-up is recommended. Coronary vascular calcification. Hepatobiliary: Gallstones.  No biliary dilatation Pancreas: Unremarkable. No pancreatic ductal dilatation or surrounding inflammatory changes. Spleen: Normal in size  without focal abnormality. Adrenals/Urinary Tract: Adrenal glands are normal. Kidneys show no hydronephrosis. The bladder is unremarkable Stomach/Bowel: Stomach is within normal limits. Appendix appears normal. No evidence of bowel wall thickening, distention, or inflammatory changes. Vascular/Lymphatic: Moderate aortic atherosclerosis. No aneurysm. No suspicious lymph nodes Reproductive: Prostate is unremarkable. Other: Negative for pelvic effusion or free air. Musculoskeletal: Hardware in the right femur with artifact. No acute or suspicious osseous abnormality. IMPRESSION: 1. No CT evidence for acute intra-abdominal or pelvic abnormality. 2. Gallstones. 3. Aortic atherosclerosis. Aortic Atherosclerosis (ICD10-I70.0). Electronically Signed   By: Jasmine Pang M.D.   On: 08/24/2023 16:45       Assessment and Plan: * AKI (acute kidney injury) (HCC) Urine sodium and creatinine pending, CT abdomen nonactionable.  This seems to be most likely a prerenal versus ATN related acute kidney injury.  We will trend patient's intake output, and creatinine and response of fluids  Transaminitis Chronic, stable. Consider outpatient GI eval. Recent viral serologies in chart.  Hyponatremia This is moderate and, on review of prior labs, mostly chronic.  I suspect this is prerenal given patient's intolerance of p.o. diet recently as well as nausea and vomiting.  Urine sodium level is pending, patient  does need fluid regardless of urine sodium finding.  However if urine sodium is markedly elevated, patient will need some hydrochlorothiazide like agent to disrupt sodium excretion in the urine  Nausea with vomiting history of severe erosive esophagitis, esophageal ulcers, gastritis found on EGD 11/2022, was placed on PPI and Carafate.   However patient has not been taking Carafate since his discharge.  Patient actually has been taking Prilosec twice daily.  I advised patient to refrain completely from using tobacco or  marijuana as this can exacerbate peptic ulcer disease as well as potentially nausea and vomiting.  I think patient would also benefit from alcohol abstinence given his elevated LFTs, which was advised to the patient, wife at the bedside.  I will check stool H. pylori pylori just to make sure it is not complicating the clinical picture in the situation  Patient has known gallbladder with gallstones, however recent HIDA scan on chart was negative for any obstruction.  Consider GI evaluation in this regard.  Patient s.p metoclopramide in ER. Requesting water. Seems comfortable. Will give low dose haloperidol given concern of cannabinoid hyperemesis. C.w. zofran thereafter.   Home med collection pending pharm input. Some meds ordered based on last DC summary.   Advance Care Planning:   Code Status: Prior full code  Consults: none called at this time.  Family Communication: wife at bedside, all questions answered.  Severity of Illness: The appropriate patient status for this patient is INPATIENT. Inpatient status is judged to be reasonable and necessary in order to provide the required intensity of service to ensure the patient's safety. The patient's presenting symptoms, physical exam findings, and initial radiographic and laboratory data in the context of their chronic comorbidities is felt to place them at high risk for further clinical deterioration. Furthermore, it is not anticipated that the patient will be medically stable for discharge from the hospital within 2 midnights of admission.   * I certify that at the point of admission it is my clinical judgment that the patient will require inpatient hospital care spanning beyond 2 midnights from the point of admission due to high intensity of service, high risk for further deterioration and high frequency of surveillance required.*  Author: Nolberto Hanlon, MD 08/24/2023 6:19 PM  For on call review [email or web address].

## 2023-08-24 NOTE — Assessment & Plan Note (Addendum)
Chronic, stable. Consider outpatient GI eval. Recent viral serologies in chart.

## 2023-08-24 NOTE — Plan of Care (Signed)

## 2023-08-24 NOTE — Assessment & Plan Note (Addendum)
history of severe erosive esophagitis, esophageal ulcers, gastritis found on EGD 11/2022, was placed on PPI and Carafate.   However patient has not been taking Carafate since his discharge.  Patient actually has been taking Prilosec twice daily.  I advised patient to refrain completely from using tobacco or marijuana as this can exacerbate peptic ulcer disease as well as potentially nausea and vomiting.  I think patient would also benefit from alcohol abstinence given his elevated LFTs, which was advised to the patient, wife at the bedside.  I will check stool H. pylori pylori just to make sure it is not complicating the clinical picture in the situation  Patient has known gallbladder with gallstones, however recent HIDA scan on chart was negative for any obstruction.  Consider GI evaluation in this regard.  Patient s.p metoclopramide in ER. Requesting water. Seems comfortable. Will give low dose haloperidol given concern of cannabinoid hyperemesis. C.w. zofran thereafter.

## 2023-08-24 NOTE — Assessment & Plan Note (Signed)
This is moderate and, on review of prior labs, mostly chronic.  I suspect this is prerenal given patient's intolerance of p.o. diet recently as well as nausea and vomiting.  Urine sodium level is pending, patient does need fluid regardless of urine sodium finding.  However if urine sodium is markedly elevated, patient will need some hydrochlorothiazide like agent to disrupt sodium excretion in the urine

## 2023-08-24 NOTE — Discharge Summary (Signed)
Physician Discharge Summary   Patient: Richard Lopez MRN: [id number] DOB: 04-03-1976  Admit date:     08/17/2023  Discharge date: 08/20/2023  Discharge Physician: Lynden Oxford  PCP: [name], NP  Recommendations at discharge: Follow-up with PCP in 1 week.   Follow-up Information     [name], NP. Schedule an appointment as soon as possible for a visit in 1 week(s).   Specialty: Internal Medicine Contact information: [address] [place] [address] [phone]                Discharge Diagnoses: Brief summary    Patient is a 47 year old male with prior PE, treated, HTN, severe esophagitis, DM, depression, anxiety presented with abdominal pain, nausea vomiting, dehydration.  He has a history of severe erosive esophagitis, esophageal ulcers, gastritis found on EGD 11/2022, was placed on PPI and Carafate.  Patient reported that he takes Protonix, had been in usual state of health until the last couple of weeks when he started having abdominal pain.  It worsened in the last week due to stress (multiple deaths in the family/friends).  He also barely ate in the last week, had a severe worsening epigastric pain, nausea and vomiting.  In the last 24 hours prior to admission, felt diffusely tender.  No melena, hematochezia or hematemesis.  No prior history of pancreatitis. In ED, temp 98.1 F RR 42, pulse 117, BP 161/109 Sodium 131, 16, BUN 20, creatinine 2.18.  Baseline creatinine 1.0 on 05/23/2023  LFTs showed AST 183, ALT 101, lipase 72, total bilirubin 1.7   Assessment & Plan  Acute abdominal pain, nausea and vomiting likely due to esophagitis, possible acute cholecystitis, mild pancreatitis -Patient has a history of severe erosive esophagitis and gastritis, continue Protonix, Carafate -Placed on IV fluid hydration, tolerated diet -Also noted elevated LFTs, lipase on admission -Right upper quadrant ultrasound showed distended gallbladder with  gallstones, slight wall thickening, no biliary duct dilation -General Surgery consulted, underwent HIDA scan which was unremarkable. General surgery recommended outpatient follow-up in GI evaluation if patient continues to have symptoms. -LFTs improving,   Severe alcohol withdrawal. Potential delirium tremens. Patient becomes severely agitated. Required transfer to the ICU. Received IV Valium without any improvement. Started on Precedex. Also started on phenobarb after discussion with ICU. Agitation improved with intervention.  Patient currently has no desire of stopping alcohol intake but tells me that he will try. Will provide low-dose gabapentin taper.   AKI (acute kidney injury) (HCC) -Suspect prerenal due to significant dehydration, poor oral intake in the last 1 week, nausea and vomiting Treated with IV hydration with resolution.  Mild hyponatremia -Likely due to hypovolemia, poor oral intake Resolved.   Hypokalemia -Replaced     Type 2 diabetes mellitus, diet controlled -Continue sliding scale insulin while inpatient -Hemoglobin A1c 5.5 on 08/17/2023   Depression, anxiety -Due to recent life stressors, death in the family/friends -TOC consult   Hypertension -Continue amlodipine   Multiple joint swelling. Does not appear to have any active infection. For now we will monitor.  Consultants:  PCCM  General surgery   Procedures performed:  none  DISCHARGE MEDICATION: Allergies as of 08/20/2023       Reactions   Ativan [lorazepam] Anxiety   Gives patient hallucinations   Nsaids    Hx of esophageal ulcer        Medication List     STOP taking these medications    potassium chloride SA 20 MEQ tablet Commonly known as:  KLOR-CON M   predniSONE 10 MG (21) Tbpk tablet Commonly known as: STERAPRED UNI-PAK 21 TAB       TAKE these medications    acetaminophen 500 MG tablet Commonly known as: TYLENOL Take 1 tablet (500 mg total) by mouth daily as  needed for mild pain (pain score 1-3), moderate pain (pain score 4-6), fever or headache. What changed:  how much to take reasons to take this   amLODipine 10 MG tablet Commonly known as: NORVASC Take 1 tablet (10 mg total) by mouth daily.   feeding supplement Liqd Take 237 mLs by mouth 3 (three) times daily between meals. What changed: when to take this   folic acid 1 MG tablet Commonly known as: FOLVITE Take 1 tablet (1 mg total) by mouth daily.   gabapentin 300 MG capsule Commonly known as: Neurontin Day 1: 300 mg orally every 6 hours (1200 mg total daily dose), Day 2: 300 mg orally every 8 hours (900 mg total daily dose), Day 3: 300 mg orally every 12 hours (600 mg total daily dose),, Day 4: 300 mg once at night (300 mg total daily dose)   multivitamin with minerals Tabs tablet Take 1 tablet by mouth daily.   Muscle Rub 10-15 % Crea Apply 1 Application topically 4 (four) times daily as needed for muscle pain.   pantoprazole 40 MG tablet Commonly known as: Protonix Take 1 tablet (40 mg total) by mouth 2 (two) times daily before a meal. What changed: when to take this   sucralfate 1 g tablet Commonly known as: Carafate Take 1 tablet (1 g total) by mouth 2 (two) times daily.   thiamine 100 MG tablet Commonly known as: Vitamin B-1 Take 1 tablet (100 mg total) by mouth daily.       Disposition: Home Diet recommendation: Cardiac diet  Discharge Exam: Vitals:   08/20/23 0700 08/20/23 0800 08/20/23 0900 08/20/23 1011  BP:   116/75 127/66  Pulse:   72   Resp:   (!) 21   Temp:  (!) 97.5 F (36.4 C)    TempSrc:  Oral    SpO2:   100%   Weight: 99.9 kg     Height: 6\' 6"  (1.981 m)      General: Appear in no distress; no visible Abnormal Neck Mass Or lumps, Conjunctiva normal Cardiovascular: S1 and S2 Present, no Murmur, Respiratory: good respiratory effort, Bilateral Air entry present and CTA, no Crackles, no wheezes Abdomen: Bowel Sound present, Non tender   Extremities: no Pedal edema, chronic appearing tophaceous nodules on multiple joints Neurology: alert and oriented to time, place, and person  Filed Weights   08/19/23 0700 08/20/23 0700  Weight: 99.9 kg 99.9 kg   Condition at discharge: stable  The results of significant diagnostics from this hospitalization (including imaging, microbiology, ancillary and laboratory) are listed below for reference.   Imaging Studies: NM Hepato W/EF  Result Date: 08/18/2023 CLINICAL DATA:  Cholelithiasis. EXAM: NUCLEAR MEDICINE HEPATOBILIARY IMAGING WITH GALLBLADDER EF TECHNIQUE: Sequential images of the abdomen were obtained out to 60 minutes following intravenous administration of radiopharmaceutical. After oral ingestion of Ensure, gallbladder ejection fraction was determined. At 60 min, normal ejection fraction is greater than 33%. RADIOPHARMACEUTICALS:  5.4 mCi Tc-71m  Choletec IV COMPARISON:  Ultrasound 08/17/2019 FINDINGS: Prompt uptake and biliary excretion of activity by the liver is seen. Gallbladder activity is visualized, consistent with patency of cystic duct. Biliary activity passes into small bowel, consistent with patent common bile duct. Calculated gallbladder  ejection fraction is 66%. (Normal gallbladder ejection fraction with Ensure is greater than 33% and less than 80%.) IMPRESSION: Ejection fraction of the gallbladder normal at 66%. No common duct or cystic duct obstruction. Electronically Signed   By: Karen Kays M.D.   On: 08/18/2023 17:37   US Abdomen Limited RUQ (LIVER/GB)  Result Date: 08/17/2023 CLINICAL DATA:  LFT elevation. EXAM: ULTRASOUND ABDOMEN LIMITED RIGHT UPPER QUADRANT COMPARISON:  CT 04/20/2023.  Ultrasound 11/08/2022 FINDINGS: Gallbladder: Distended gallbladder with multiple shadowing stones. Wall thickness of up to 4 mm, slightly thickened. No adjacent fluid. No reported sonographic Murphy's sign Common bile duct: Diameter: 2 mm Liver: Diffusely echogenic hepatic  parenchyma consistent with fatty liver infiltration. Portal vein is patent on color Doppler imaging with normal direction of blood flow towards the liver. Other: None. IMPRESSION: Distended gallbladder with stones. Slight wall thickening. Please correlate for other clinical signs of acute cholecystitis and if needed HIDA scan. No biliary ductal dilatation. Fatty liver infiltration Electronically Signed   By: Karen Kays M.D.   On: 08/17/2023 09:45    Microbiology: Results for orders placed or performed during the hospital encounter of 08/17/23  MRSA Next Gen by PCR, Nasal     Status: None   Collection Time: 08/19/23  8:52 AM   Specimen: Nasal Mucosa; Nasal Swab  Result Value Ref Range Status   MRSA by PCR Next Gen NOT DETECTED NOT DETECTED Final    Comment: (NOTE) The GeneXpert MRSA Assay (FDA approved for NASAL specimens only), is one component of a comprehensive MRSA colonization surveillance program. It is not intended to diagnose MRSA infection nor to guide or monitor treatment for MRSA infections. Test performance is not FDA approved in patients less than 71 years old. Performed at [facility], [address] [place], [place] [address]    Labs: CBC: Recent Labs  Lab 08/18/23 0414 08/19/23 0358 08/20/23 0018  WBC 4.6 4.5 3.6*  NEUTROABS  --   --  1.5*  HGB 11.5* 11.3* 10.5*  HCT 33.2* 33.1* 31.2*  MCV 88.1 89.0 91.5  PLT 123* 131* 121*   Basic Metabolic Panel: Recent Labs  Lab 08/17/23 1736 08/18/23 0414 08/19/23 0358 08/19/23 2133 08/20/23 0018  NA 132* 129* 129* 131* 128*  K 3.6 3.4* 3.3* 3.4* 3.4*  CL 94* 95* 95* 99 97*  CO2 21* 23 22 22  20*  GLUCOSE 89 101* 91 80 72  BUN 20 17 12 14 13   CREATININE 1.39* 1.07 0.85 0.75 0.77  CALCIUM 8.8* 8.5* 8.5* 8.5* 8.3*  PHOS  --   --  2.4*  --   --    Liver Function Tests: Recent Labs  Lab 08/18/23 0414 08/19/23 0358 08/19/23 2133 08/20/23 0018  AST 108* 90* 105* 98*  ALT 76* 72* 76* 74*   ALKPHOS 61 63 55 54  BILITOT 1.9* 1.3* 1.2 1.3*  PROT 7.5 7.1 6.6 6.5  ALBUMIN 3.8 3.9 3.6 3.5   CBG: Recent Labs  Lab 08/19/23 1210 08/19/23 1642 08/19/23 2155 08/20/23 0753 08/20/23 1135  GLUCAP 130* 79 86 159* 107*    Discharge time spent: greater than 30 minutes.  Author: Lynden Oxford, MD  [facility] 08/20/2023

## 2023-08-24 NOTE — ED Provider Notes (Signed)
Pt signed out by Dr. Freida Busman pending hospitalist call back.  Pt d/w Dr. Charmayne Sheer ([facility]) for admission.    Jacalyn Lefevre, MD 08/24/23 [phone]

## 2023-08-24 NOTE — ED Triage Notes (Signed)
Pt arrived reporting vomiting since yesterday, unable to keep anything down. Also states body cramping all over. Endorses last week was d/c on Thursday. Prior to d/c was told he needs to have gallbladder removed due to stones. However, no follow up was initiated. No other symptoms reports

## 2023-08-24 NOTE — Assessment & Plan Note (Signed)
Urine sodium and creatinine pending, CT abdomen nonactionable.  This seems to be most likely a prerenal versus ATN related acute kidney injury.  We will trend patient's intake output, and creatinine and response of fluids

## 2023-08-25 ENCOUNTER — Inpatient Hospital Stay (HOSPITAL_COMMUNITY): Payer: Medicaid Other

## 2023-08-25 DIAGNOSIS — R112 Nausea with vomiting, unspecified: Secondary | ICD-10-CM | POA: Diagnosis not present

## 2023-08-25 DIAGNOSIS — R1084 Generalized abdominal pain: Secondary | ICD-10-CM | POA: Diagnosis not present

## 2023-08-25 DIAGNOSIS — N179 Acute kidney failure, unspecified: Secondary | ICD-10-CM | POA: Diagnosis not present

## 2023-08-25 LAB — CBC
HCT: 35.8 % — ABNORMAL LOW (ref 39.0–52.0)
Hemoglobin: 12.4 g/dL — ABNORMAL LOW (ref 13.0–17.0)
MCH: 30.2 pg (ref 26.0–34.0)
MCHC: 34.6 g/dL (ref 30.0–36.0)
MCV: 87.3 fL (ref 80.0–100.0)
Platelets: 355 10*3/uL (ref 150–400)
RBC: 4.1 MIL/uL — ABNORMAL LOW (ref 4.22–5.81)
RDW: 13.3 % (ref 11.5–15.5)
WBC: 5.1 10*3/uL (ref 4.0–10.5)
nRBC: 0 % (ref 0.0–0.2)

## 2023-08-25 LAB — RAPID URINE DRUG SCREEN, HOSP PERFORMED
Amphetamines: NOT DETECTED
Barbiturates: POSITIVE — AB
Benzodiazepines: POSITIVE — AB
Cocaine: NOT DETECTED
Opiates: NOT DETECTED
Tetrahydrocannabinol: POSITIVE — AB

## 2023-08-25 LAB — BASIC METABOLIC PANEL
Anion gap: 14 (ref 5–15)
BUN: 20 mg/dL (ref 6–20)
CO2: 21 mmol/L — ABNORMAL LOW (ref 22–32)
Calcium: 9.2 mg/dL (ref 8.9–10.3)
Chloride: 91 mmol/L — ABNORMAL LOW (ref 98–111)
Creatinine, Ser: 1.41 mg/dL — ABNORMAL HIGH (ref 0.61–1.24)
GFR, Estimated: >60 mL/min (ref >60)
Glucose, Bld: 176 mg/dL — ABNORMAL HIGH (ref 70–99)
Potassium: 3.2 mmol/L — ABNORMAL LOW (ref 3.5–5.1)
Sodium: 126 mmol/L — ABNORMAL LOW (ref 135–145)

## 2023-08-25 LAB — APTT: aPTT: 36 s (ref 24–36)

## 2023-08-25 LAB — PROTIME-INR
INR: 1.1 (ref 0.8–1.2)
Prothrombin Time: 14.2 s (ref 11.4–15.2)

## 2023-08-25 LAB — URINE CULTURE: Culture: <10000 — AB

## 2023-08-25 MED ORDER — HYDRALAZINE HCL 20 MG/ML IJ SOLN
10.0000 mg | Freq: Once | INTRAMUSCULAR | Status: AC
Start: 2023-08-25 — End: 2023-08-25
  Administered 2023-08-25: 10 mg via INTRAVENOUS
  Filled 2023-08-25: qty 1

## 2023-08-25 MED ORDER — HYDRALAZINE HCL 20 MG/ML IJ SOLN
5.0000 mg | Freq: Once | INTRAMUSCULAR | Status: AC
  Administered 2023-08-25: 5 mg via INTRAVENOUS
  Filled 2023-08-25: qty 1

## 2023-08-25 NOTE — Hospital Course (Signed)
47 y.o. male with medical history significant of prior PE, treated, HTN, severe esophagitis, DM, depression. Was discharged from [facility] on 08/20/2023 after Rx for  abdominal pain, nausea vomiting, dehydration.   On the day of discharge, patient has been n.p.o. for anticipated cholecystectomy, this was not ultimately deferred after his HIDA scan came back negative and surgery opined that a  cholecystectomy was not needed.    However on the day of discharge, patient apparently received some benzodiazepine which caused confusion.  And after returning home patient did not really engage in any activities and was in bed pretty much all day.  Starting couple of days after discharge on Saturday, patient reports that attempts to eat were followed by vomiting within an hour or so with copious amounts of clear gastric fluid.  Patient's attempt to drink fluids were also limited as he was having nausea and would occasionally throw that up.  Therefore patient has for all practical purposes not been eating or drinking much since his discharge, is reporting a generalized abdominal discomfort no fever no diarrhea.  And multiple episodes of vomiting typically associated with attempts to eat or drink.   Patient has smoked both tobacco as well as marijuana since discharge.  There is no history of any fever no presyncope loss of consciousness.  Patient did noticed that he was not urinating as much.  Patient finally came to the ER because of not getting better in terms of his symptoms as above.   ER workup is notable for patient found to have acute kidney injury compared to baseline labs from a few days ago.  Wife is at the bedside, medical evaluation is sought

## 2023-08-25 NOTE — Progress Notes (Signed)
Progress Note   Patient: Richard Lopez:[id number] DOB: 04/07/1976 DOA: 08/24/2023     1 DOS: the patient was seen and examined on 08/25/2023   Brief hospital course: 47 y.o. male with medical history significant of prior PE, treated, HTN, severe esophagitis, DM, depression. Was discharged from [facility] on 08/20/2023 after Rx for  abdominal pain, nausea vomiting, dehydration.   On the day of discharge, patient has been n.p.o. for anticipated cholecystectomy, this was not ultimately deferred after his HIDA scan came back negative and surgery opined that a  cholecystectomy was not needed.    However on the day of discharge, patient apparently received some benzodiazepine which caused confusion.  And after returning home patient did not really engage in any activities and was in bed pretty much all day.  Starting couple of days after discharge on Saturday, patient reports that attempts to eat were followed by vomiting within an hour or so with copious amounts of clear gastric fluid.  Patient's attempt to drink fluids were also limited as he was having nausea and would occasionally throw that up.  Therefore patient has for all practical purposes not been eating or drinking much since his discharge, is reporting a generalized abdominal discomfort no fever no diarrhea.  And multiple episodes of vomiting typically associated with attempts to eat or drink.   Patient has smoked both tobacco as well as marijuana since discharge.  There is no history of any fever no presyncope loss of consciousness.  Patient did noticed that he was not urinating as much.  Patient finally came to the ER because of not getting better in terms of his symptoms as above.   ER workup is notable for patient found to have acute kidney injury compared to baseline labs from a few days ago.  Wife is at the bedside, medical evaluation is sought  Assessment and Plan: AKI (acute kidney injury) (HCC) -Urine sodium low, suggesting  dehydration -Cont hydration as tolerated -Recheck bmet in AM   Transaminitis Chronic, stable. Consider outpatient GI eval. Recent viral serologies in chart.   Hyponatremia Likely secondary to dehydration -Cont hydration as tolerated -Recheck bmet in A:M   Nausea with vomiting history of severe erosive esophagitis, esophageal ulcers, gastritis found on EGD 11/2022, was placed on PPI and Carafate.  Recent HIDA neg at previous admit This AM, pt reports feeling much improved, wanting to advance diet Of note, UDS was pos for barbituates, benzo, and marijuana -Cont with antiemetic and advance diet as tolerated  . Subjective: Feeling much better today. Wanting to advance diet  Physical Exam: Vitals:   08/25/23 0432 08/25/23 0647 08/25/23 0831 08/25/23 1431  BP: (!) 184/103 (!) 145/97 (!) 157/100 136/81  Pulse: (!) 107 (!) 101 (!) 105 81  Resp: 18     Temp: 98.9 F (37.2 C)  98.1 F (36.7 C) 98.2 F (36.8 C)  TempSrc: Oral  Oral Oral  SpO2: 100%  100% 100%  Weight:      Height:       General exam: Awake, laying in bed, in nad Respiratory system: Normal respiratory effort, no wheezing Cardiovascular system: regular rate, s1, s2 Gastrointestinal system: Soft, nondistended, positive BS Central nervous system: CN2-12 grossly intact, strength intact Extremities: Perfused, no clubbing Skin: Normal skin turgor, no notable skin lesions seen Psychiatry: Mood normal // no visual hallucinations   Data Reviewed:  Labs reviewed: Na 126, K 3.2, Cr 1.41, WBC 5.1  Family Communication: Pt in room, family not at bedside  Disposition: Status is: Inpatient Remains inpatient appropriate because: Severity of illness  Planned Discharge Destination: Home     Author: Rickey Barbara, MD 08/25/2023 7:02 PM  For on call review [email or web address].

## 2023-08-25 NOTE — Consult Note (Addendum)
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   If L elbow is not draining ok to leave open to air.   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 Has follow-up in 6 weeks.   POC discussed with bedside nurse.   Thank you,    Priscella Mann MSN, RN-BC, [employer] [phone]

## 2023-08-25 NOTE — Plan of Care (Signed)

## 2023-08-25 NOTE — TOC CM/SW Note (Signed)
Transition of Care [facility]) - Inpatient Brief Assessment   Patient Details  Name: Richard Lopez MRN: [id number] Date of Birth: 06/20/1976  Transition of Care [facility]) CM/SW Contact:    Otelia Santee, LCSW Phone Number: 08/25/2023, 12:27 PM   Clinical Narrative: Pt recently discharged from hospital on 08/20/23. SA resources were provided to pt at discharge on 10/17.  No TOC needs identified at this time.   Transition of Care Asessment: Insurance and Status: Insurance coverage has been reviewed Patient has primary care physician: Yes Home environment has been reviewed: Single family home Prior level of function:: Independent Prior/Current Home Services: No current home services Social Determinants of Health Reivew: SDOH reviewed no interventions necessary Readmission risk has been reviewed: Yes Transition of care needs: no transition of care needs at this time

## 2023-08-26 DIAGNOSIS — R1084 Generalized abdominal pain: Secondary | ICD-10-CM | POA: Diagnosis not present

## 2023-08-26 DIAGNOSIS — N179 Acute kidney failure, unspecified: Secondary | ICD-10-CM | POA: Diagnosis not present

## 2023-08-26 LAB — COMPREHENSIVE METABOLIC PANEL
ALT: 35 U/L (ref 0–44)
AST: 34 U/L (ref 15–41)
Albumin: 3.8 g/dL (ref 3.5–5.0)
Alkaline Phosphatase: 52 U/L (ref 38–126)
Anion gap: 13 (ref 5–15)
BUN: 14 mg/dL (ref 6–20)
CO2: 22 mmol/L (ref 22–32)
Calcium: 9.2 mg/dL (ref 8.9–10.3)
Chloride: 96 mmol/L — ABNORMAL LOW (ref 98–111)
Creatinine, Ser: 0.89 mg/dL (ref 0.61–1.24)
GFR, Estimated: >60 mL/min (ref >60)
Glucose, Bld: 125 mg/dL — ABNORMAL HIGH (ref 70–99)
Potassium: 3.3 mmol/L — ABNORMAL LOW (ref 3.5–5.1)
Sodium: 131 mmol/L — ABNORMAL LOW (ref 135–145)
Total Bilirubin: 0.9 mg/dL (ref 0.3–1.2)
Total Protein: 7.9 g/dL (ref 6.5–8.1)

## 2023-08-26 LAB — CBC
HCT: 35 % — ABNORMAL LOW (ref 39.0–52.0)
Hemoglobin: 12.2 g/dL — ABNORMAL LOW (ref 13.0–17.0)
MCH: 30.3 pg (ref 26.0–34.0)
MCHC: 34.9 g/dL (ref 30.0–36.0)
MCV: 87.1 fL (ref 80.0–100.0)
Platelets: 370 10*3/uL (ref 150–400)
RBC: 4.02 MIL/uL — ABNORMAL LOW (ref 4.22–5.81)
RDW: 13.2 % (ref 11.5–15.5)
WBC: 4.2 10*3/uL (ref 4.0–10.5)
nRBC: 0 % (ref 0.0–0.2)

## 2023-08-26 MED ORDER — OXYCODONE HCL 5 MG PO TABS
5.0000 mg | ORAL_TABLET | Freq: Four times a day (QID) | ORAL | Status: AC | PRN
  Administered 2023-08-26: 5 mg via ORAL
  Filled 2023-08-26: qty 1

## 2023-08-26 MED ORDER — POTASSIUM CHLORIDE CRYS ER 20 MEQ PO TBCR
40.0000 meq | EXTENDED_RELEASE_TABLET | Freq: Once | ORAL | Status: AC
  Administered 2023-08-26: 40 meq via ORAL
  Filled 2023-08-26: qty 2

## 2023-08-26 NOTE — Evaluation (Signed)
Physical Therapy Evaluation Patient Details Name: Richard Lopez MRN: [id number] DOB: 1976-04-10 Today's Date: 08/26/2023  History of Present Illness  Pt is a 47 y/o M admitted on 08/24/23. Pt recently d/c from [facility] on 08/20/23 after being treated for abdominal pain, N&V, & dehydration. Pt returned to hospital with c/o vomiting with any attempts to eat/drink. Pt found to have AKI. PMH: PE, HTN, severe esophagitis, DM, depression  Clinical Impression  Pt seen for PT evaluation with pt agreeable. Pt reports prior to admission he was living alone in a 1 level home with 2 steps to enter but his significant other is often present. Pt reports he ambulates without AD/furniture walks in the home, or uses QC in the community. Pt with hx of BLE injuries that impact mobility. On this date, pt c/o R knee stiffness that improves with mobility. Pt is able to ambulate with RW & mod I, or without AD & supervision. Feel pt is close to baseline in regards to mobility; no acute PT needs at this time. Recommend pt continue mobilizing with mobility specialists or nursing staff. PT to complete current orders, please re-consult if new needs arise.        If plan is discharge home, recommend the following: Assistance with cooking/housework   Can travel by private vehicle        Equipment Recommendations None recommended by PT (pt has all necessary DME)  Recommendations for Other Services       Functional Status Assessment Patient has not had a recent decline in their functional status     Precautions / Restrictions Precautions Precautions: Fall Restrictions Weight Bearing Restrictions: No      Mobility  Bed Mobility Overal bed mobility: Modified Independent Bed Mobility: Supine to Sit, Sit to Supine     Supine to sit: Modified independent (Device/Increase time), HOB elevated Sit to supine: Modified independent (Device/Increase time), HOB elevated        Transfers Overall transfer level:  Needs assistance Equipment used: None Transfers: Sit to/from Stand Sit to Stand: Modified independent (Device/Increase time)           General transfer comment: STS from EOB without assistance/AD    Ambulation/Gait Ambulation/Gait assistance: Supervision, Modified independent (Device/Increase time) Gait Distance (Feet):  (>150 ft) Assistive device: None, Rolling walker (2 wheels) Gait Pattern/deviations: Decreased stride length, Decreased weight shift to right, Decreased stance time - right Gait velocity: decreased     General Gait Details: Pt ambulates bed>bathroom without AD but holding to furniture/objects for support 2/2 R knee stiffness. Pt ambulates into hallway with RW & progresses to mod I but then progresses to ambulating in the hallway without AD with supervision without LOB. Pt with decreased weight shifting to RLE in stance phase, decreased R terminal knee extension in stance phase.  Stairs            Wheelchair Mobility     Tilt Bed    Modified Schrupp (Stroke Patients Only)       Balance     Sitting balance-Leahy Scale: Good     Standing balance support: No upper extremity supported Standing balance-Leahy Scale: Fair                               Pertinent Vitals/Pain Pain Assessment Pain Assessment: 0-10 Pain Score: 7  Pain Location: R knee Pain Descriptors / Indicators: Discomfort Pain Intervention(s): Monitored during session    Home Living Family/patient  expects to be discharged to:: Private residence Living Arrangements: Alone Available Help at Discharge: Family;Available PRN/intermittently Type of Home: House Home Access: Stairs to enter Entrance Stairs-Rails: Right;Left;Can reach both;None (no rails at back, B rails at front) Entrance Stairs-Number of Steps: 2   Home Layout: One level Home Equipment: Agricultural consultant (2 wheels);Cane - quad;Cane - single point      Prior Function               Mobility  Comments: notes 2 falls in the past 6 months, walks in the home either without AD or furniture walking, uses QC for longer distances in the community, has hx of BLE injuries that impact mobility       Extremity/Trunk Assessment   Upper Extremity Assessment Upper Extremity Assessment: Overall WFL for tasks assessed    Lower Extremity Assessment Lower Extremity Assessment: Generalized weakness;RLE deficits/detail RLE Deficits / Details: R knee deformity, R knee edema       Communication   Communication Communication: No apparent difficulties  Cognition Arousal: Alert Behavior During Therapy: WFL for tasks assessed/performed Overall Cognitive Status: Within Functional Limits for tasks assessed                                          General Comments General comments (skin integrity, edema, etc.): Pt with continent void standing in bathroom without assistance.    Exercises     Assessment/Plan    PT Assessment Patient does not need any further PT services  PT Problem List         PT Treatment Interventions      PT Goals (Current goals can be found in the Care Plan section)  Acute Rehab PT Goals Patient Stated Goal: decreased pain PT Goal Formulation: With patient Time For Goal Achievement: 09/09/23 Potential to Achieve Goals: Good    Frequency       Co-evaluation               AM-PAC PT "6 Clicks" Mobility  Outcome Measure Help needed turning from your back to your side while in a flat bed without using bedrails?: None Help needed moving from lying on your back to sitting on the side of a flat bed without using bedrails?: None Help needed moving to and from a bed to a chair (including a wheelchair)?: None Help needed standing up from a chair using your arms (e.g., wheelchair or bedside chair)?: None Help needed to walk in hospital room?: None Help needed climbing 3-5 steps with a railing? : None 6 Click Score: 24    End of Session    Activity Tolerance: Patient tolerated treatment well Patient left: in bed;with call bell/phone within reach Nurse Communication: Mobility status      Time: 2725-3664 PT Time Calculation (min) (ACUTE ONLY): 13 min   Charges:   PT Evaluation $PT Eval Low Complexity: 1 Low   PT General Charges $$ ACUTE PT VISIT: 1 Visit         Aleda Grana, PT, DPT 08/26/23, 9:08 AM   Sandi Mariscal 08/26/2023, 9:07 AM

## 2023-08-26 NOTE — Progress Notes (Signed)
[facility]                                                                              Richard Lopez, is a 47 y.o. male, DOB - Aug 17, 1976, XLK:[id number] Admit date - 08/24/2023    Outpatient Primary MD for the patient is Richard Sessions, NP  LOS - 2  days  Chief Complaint  Patient presents with   Abdominal Pain   Vomiting       Brief summary   Patient is a 47 year old male with history of prior PE, treated, HTN, severe esophagitis per EGD in 11/2022, DM, depression was discharged from [facility] on 08/20/2023 after treatment for abdominal pain, nausea vomiting and dehydration.   Per patient since the day of discharge he had been in bed pretty much all day, still continued to have vomiting on attempting to eat.  Patient had smoked both tobacco and marijuana since discharge. Presented with acute kidney injury from dehydration.  Assessment & Plan    Principal Problem:   AKI (acute kidney injury) (HCC) -Likely from dehydration, poor oral intake -Creatinine 2.36 on admission, placed on IV fluids, improved to 0.89   Active Problems:   Nausea with vomiting -Patient reported feeling better, tolerating clear liquid diet, will advance to full liquid diet and reassess -If he is not able to tolerate full liquid diet, will consult with gastroenterology, may need repeat EGD -Patient has history of severe erosive esophagitis, esophageal ulcers, gastritis found on EGD 11/2022 -Recent HIDA scan negative for acute cholecystitis -Continue PPI, Carafate    Hyponatremia -Secondary to dehydration -Presented with sodium of 126 -Improving to 131    Transaminitis -Resolved  Estimated body mass index is 25.22 kg/m as calculated from the following:   Height as of this encounter: 6\' 6"  (1.981 m).   Weight as of this encounter: 99 kg.  Code Status: Full code DVT Prophylaxis:  heparin injection 5,000 Units Start: 08/25/23 0600 SCDs Start: 08/24/23 1835   Level of Care:  Level of care: Telemetry Family Communication: Updated patient' Disposition Plan:      Remains inpatient appropriate:      Procedures:    Consultants:     Antimicrobials:   Anti-infectives (From admission, onward)    None          Medications  amLODipine  10 mg Oral Daily   heparin  5,000 Units Subcutaneous Q8H   pantoprazole (PROTONIX) IV  40 mg Intravenous Q12H   sodium chloride flush  3 mL Intravenous Q12H   sucralfate  1 g Oral TID WC & HS      Subjective:   [name] was seen and examined today.  States tolerating clear liquid diet, wants to advance to full liquid diet.  No ongoing vomiting, acute abdominal pain, chest pain, shortness of breath  Objective:   Vitals:   08/25/23 0831 08/25/23 1431 08/25/23 1941 08/26/23 0443  BP: (!) 157/100 136/81 (!) 165/102 (!) 155/87  Pulse: (!) 105 81 96 87  Resp:   18 18  Temp: 98.1 F (36.7 C) 98.2 F (36.8 C) (!) 97.5 F (36.4 C) 97.9 F (36.6 C)  TempSrc: Oral Oral Oral   SpO2: 100% 100% 100% 100%  Weight:      Height:        Intake/Output Summary (Last 24 hours) at 08/26/2023 1345 Last data filed at 08/25/2023 2316 Gross per 24 hour  Intake 2072.94 ml  Output 400 ml  Net 1672.94 ml     Wt Readings from Last 3 Encounters:  08/24/23 99 kg  08/20/23 99.9 kg  05/21/23 101.3 kg     Exam General: Alert and oriented x 3, NAD Cardiovascular: S1 S2 auscultated,  RRR Respiratory: Clear to auscultation bilaterally, no wheezing, rales or rhonchi Gastrointestinal: Soft, nontender, nondistended, + bowel sounds Ext: no pedal edema bilaterally Neuro: Strength 5/5 upper and lower extremities bilaterally Psych: Normal affect     Data Reviewed:  I have personally reviewed following labs    CBC Lab Results  Component Value Date   WBC 4.2 08/26/2023   RBC 4.02 (L) 08/26/2023   HGB 12.2 (L) 08/26/2023   HCT 35.0 (L) 08/26/2023   MCV 87.1 08/26/2023   MCH 30.3 08/26/2023   PLT 370 08/26/2023    MCHC 34.9 08/26/2023   RDW 13.2 08/26/2023   LYMPHSABS 1.6 08/20/2023   MONOABS 0.4 08/20/2023   EOSABS 0.1 08/20/2023   BASOSABS 0.0 08/20/2023     Last metabolic panel Lab Results  Component Value Date   NA 131 (L) 08/26/2023   K 3.3 (L) 08/26/2023   CL 96 (L) 08/26/2023   CO2 22 08/26/2023   BUN 14 08/26/2023   CREATININE 0.89 08/26/2023   GLUCOSE 125 (H) 08/26/2023   GFRNONAA >60 08/26/2023   GFRAA 132 07/16/2020   CALCIUM 9.2 08/26/2023   PHOS 2.4 (L) 08/19/2023   PROT 7.9 08/26/2023   ALBUMIN 3.8 08/26/2023   LABGLOB 3.3 07/16/2020   AGRATIO 1.3 07/16/2020   BILITOT 0.9 08/26/2023   ALKPHOS 52 08/26/2023   AST 34 08/26/2023   ALT 35 08/26/2023   ANIONGAP 13 08/26/2023    CBG (last 3)  No results for input(s): "GLUCAP" in the last 72 hours.    Coagulation Profile: Recent Labs  Lab 08/25/23 0618  INR 1.1     Radiology Studies: I have personally reviewed the imaging studies  DG Knee 1-2 Views Left  Result Date: 08/25/2023 CLINICAL DATA:  Chronic bilateral knee pain. EXAM: LEFT KNEE - 1-2 VIEW COMPARISON:  Left knee radiographs 08/05/2023 and 04/17/2020 FINDINGS: Severe lateral compartment joint space narrowing bone-on-bone contact. Moderate peripheral mediolateral compartment degenerative osteophytes. Moderate patellofemoral joint space narrowing with mild peripheral osteophytosis. Moderate joint effusion. Serpiginous centrally lucent and peripherally sclerotic bone infarct again seen within the proximal tibial metaphysis. Mild-to-moderate atherosclerotic calcifications. IMPRESSION: 1. 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 2. Moderate joint effusion. 3. 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 Electronically Signed   By: Neita Garnet M.D.   On: 08/25/2023 16:17   DG Knee 1-2 Views Right  Result Date: 08/25/2023 CLINICAL DATA:  Bilateral knee pain, chronic. EXAM: RIGHT KNEE - 1-2 VIEW  COMPARISON:  Right knee radiographs 08/05/2023 FINDINGS: Redemonstration of partial visualization of the distal aspect of femoral intramedullary nail with two distal interlocking screws. Severe lateral compartment joint space narrowing with bone-on-bone contact, subchondral sclerosis, and large peripheral osteophytes. Moderate medial compartment joint space narrowing and peripheral osteophytosis. Moderate patellofemoral joint space narrowing and osteophytosis. Large joint effusion. There is serpiginous centrally lucent and peripherally sclerotic densities throughout the visualized proximal tibial metaphysis and diaphysis, suggesting bone  infarcts. Moderate atherosclerotic calcifications. IMPRESSION: 1. Severe lateral compartment and moderate medial and patellofemoral compartment osteoarthritis. 2. Large joint effusion, similar to prior. 3. 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 Electronically Signed   By: Neita Garnet M.D.   On: 08/25/2023 16:16   CT ABDOMEN PELVIS WO CONTRAST  Result Date: 08/24/2023 CLINICAL DATA:  Abdomen pain vomiting EXAM: CT ABDOMEN AND PELVIS WITHOUT CONTRAST TECHNIQUE: Multidetector CT imaging of the abdomen and pelvis was performed following the standard protocol without IV contrast. RADIATION DOSE REDUCTION: This exam was performed according to the departmental dose-optimization program which includes automated exposure control, adjustment of the mA and/or kV according to patient size and/or use of iterative reconstruction technique. COMPARISON:  08/18/2023 nuclear medicine exam, ultrasound 08/17/2023, CT 04/20/2023 FINDINGS: Lower chest: Lung bases demonstrate scarring and cysts at the posterior right base as before. Stable punctate 2 mm anterior right lung base pulmonary nodule, no imaging follow-up is recommended. Coronary vascular calcification. Hepatobiliary: Gallstones.  No biliary dilatation Pancreas: Unremarkable. No  pancreatic ductal dilatation or surrounding inflammatory changes. Spleen: Normal in size without focal abnormality. Adrenals/Urinary Tract: Adrenal glands are normal. Kidneys show no hydronephrosis. The bladder is unremarkable Stomach/Bowel: Stomach is within normal limits. Appendix appears normal. No evidence of bowel wall thickening, distention, or inflammatory changes. Vascular/Lymphatic: Moderate aortic atherosclerosis. No aneurysm. No suspicious lymph nodes Reproductive: Prostate is unremarkable. Other: Negative for pelvic effusion or free air. Musculoskeletal: Hardware in the right femur with artifact. No acute or suspicious osseous abnormality. IMPRESSION: 1. No CT evidence for acute intra-abdominal or pelvic abnormality. 2. Gallstones. 3. Aortic atherosclerosis. Aortic Atherosclerosis (ICD10-I70.0). Electronically Signed   By: Jasmine Pang M.D.   On: 08/24/2023 16:45       Mikal Blasdell M.D. [facility] 08/26/2023, 1:45 PM  Available via Epic secure chat 7am-7pm After 7 pm, please refer to night coverage provider listed on amion.

## 2023-08-26 NOTE — Evaluation (Signed)
Occupational Therapy Evaluation Patient Details Name: Richard Lopez MRN: [id number] DOB: 01/17/1976 Today's Date: 08/26/2023   History of Present Illness Pt is a 47 yr old male admitted on 08/24/23. Pt recently discharged from [facility] on 08/20/23 after being treated for abdominal pain, nausea, vomiting & dehydration. Pt returned to hospital with reports of vomiting, nausea, and blurred vision. Pt found to have AKI and hyponatremia. PMH: PE, HTN, esophagitis, DM, depression, RA   Clinical Impression   The pt was able to perform all implemented tasks with supervision or better, including lower body dressing, bed mobility, sit to stand, and ambulating in his room using a RW. He does present with some deficits relative to a history of reported rheumatoid arthritis, prior injuries to both of his legs, and gout. As such, OT made appropriate equipment recommendations and education on ADLs as needed (see ADL section below). He does not require further OT services in the acute care setting, however he may benefit from outpatient therapy for his hands/upper extremities, given his decreased bilateral grip strength, fine motor coordination deficits and discomfort related to rheumatoid arthritis and gout. OT will sign off and recommend he return home at discharge.       If plan is discharge home, recommend the following: Assistance with cooking/housework    Functional Status Assessment  Patient has not had a recent decline in their functional status  Equipment Recommendations  Tub/shower seat;Toilet riser;Other (comment) (reacher, sock aid, and long-handled sponge)    Recommendations for Other Services       Precautions / Restrictions Restrictions Weight Bearing Restrictions: No      Mobility Bed Mobility Overal bed mobility: Independent Bed Mobility: Supine to Sit     Supine to sit: Independent          Transfers Overall transfer level: Needs assistance Equipment used: Rolling  walker (2 wheels) Transfers: Sit to/from Stand Sit to Stand: Modified independent (Device/Increase time)                  Balance     Sitting balance-Leahy Scale: Good       Standing balance-Leahy Scale: Fair                             ADL either performed or assessed with clinical judgement   ADL Overall ADL's : At baseline   Eating/Feeding Details (indicate cue type and reason): Pt reported being modified independent with feeding, having occasional difficulty with cutting food, opening containers, and removing lids, given arthritic changes and gout of hands, as well as associated discomfort and impaired fine motor coordination. As such, OT educated the pt on the use of built-up feeding utensils to facilitate improved grasp of utensils and overall improved ability to perform self-feeding.           Lower Body Bathing Details (indicate cue type and reason): Pt reported baseline difficulty with accessing his distal BLE, including feet, in order to perform lower body bathing tasks, given prior BLE injuries, with associated pain and R hip and knee ROM limitations. As such, OT recommended a long-handled sponge, shower chair, and hand-held shower hose to assist with bathing tasks.       Lower Body Dressing Details (indicate cue type and reason): The pt required intermittent assist for sock and shoe management at his baseline, requiring assist from his significant other at times. As such, OT instructed him on using a reacher for doffing socks,  sock aid for donning socks, and shoe horn for donning shoes.   Toilet Transfer Details (indicate cue type and reason): Pt reported intermittent difficulty with transferring onto and off his standard toilet at home, with OT subsequently educating him on the option for use of a toilet riser to facilitate improved ability to perform toilet transfers.                  Pertinent Vitals/Pain Pain Assessment Pain Assessment:  0-10 Pain Location: R knee (chronic pain) Pain Intervention(s): Limited activity within patient's tolerance, Monitored during session     Extremity/Trunk Assessment Upper Extremity Assessment Upper Extremity Assessment: Right hand dominant;RUE deficits/detail;LUE deficits/detail RUE Deficits / Details: Chronic arthritic changes of hands, with pt reporting history of RA, as well as gout. Elbow and shoulder AROM WFL. Difficulty forming fully closed fist, however gross grasp noted.  Pt also reported a history of difficulty with fine motor coordination tasks, such as writing and opening containers, due to deficits LUE Deficits / Details: Chronic arthritic changes of hands, with pt reporting history of RA, as well as gout. Elbow and shoulder AROM WFL. Difficulty forming fully closed fist, however gross grasp noted. Pt also reported a history of difficulty with fine motor coordination tasks, such as writing and opening containers, due to deficits   Lower Extremity Assessment RLE Deficits / Details: R knee deformity, R knee edema       Communication Communication Communication: No apparent difficulties   Cognition Arousal: Alert Behavior During Therapy: WFL for tasks assessed/performed Overall Cognitive Status: Within Functional Limits for tasks assessed      General Comments: Oriented x4, able to follow commands without difficulty                Home Living Family/patient expects to be discharged to:: Private residence Living Arrangements: Alone   Type of Home: House Home Access: Stairs to enter [employer] of Steps: 2   Home Layout: One level     Bathroom Shower/Tub: Tub/shower unit         Home Equipment: Agricultural consultant (2 wheels);Cane - quad;Cane - single point   Additional Comments: grab bar by toilet      Prior Functioning/Environment Prior Level of Function : Other (comment)             Mobility Comments: Ambulated without an assistive device  inside the home, however used a cane or RW for ambulating longer distances.  ADLs Comments: He was modified independent to independent with ADLs, except for needing occasional assist from his significant other for sock and shoe management, due to prior injuries to his BLE. His significant other also assisted with cooking and cleaning as needed.             OT Treatment/Interventions:   No further treatment needs identified for the acute care setting       OT Frequency:  N/A       AM-PAC OT "6 Clicks" Daily Activity     Outcome Measure Help from another person eating meals?: None Help from another person taking care of personal grooming?: None Help from another person toileting, which includes using toliet, bedpan, or urinal?: None Help from another person bathing (including washing, rinsing, drying)?: None Help from another person to put on and taking off regular upper body clothing?: None Help from another person to put on and taking off regular lower body clothing?: A Little 6 Click Score: 23   End of Session Equipment Utilized During Treatment:  Rolling walker (2 wheels) Nurse Communication: Other (comment)  Activity Tolerance: Patient tolerated treatment well Patient left: in bed;with call bell/phone within reach  OT Visit Diagnosis: Pain;Muscle weakness (generalized) (M62.81) Pain - Right/Left: Right Pain - part of body: Knee                Time: 0102-7253 OT Time Calculation (min): 45 min Charges:  OT General Charges $OT Visit: 1 Visit OT Evaluation $OT Eval Low Complexity: 1 Low OT Treatments $Self Care/Home Management : 8-22 mins   Reuben Likes, OTR/L 08/26/2023, 1:26 PM

## 2023-08-26 NOTE — Plan of Care (Signed)

## 2023-08-27 ENCOUNTER — Ambulatory Visit (INDEPENDENT_AMBULATORY_CARE_PROVIDER_SITE_OTHER): Payer: Medicaid Other | Admitting: Licensed Clinical Social Worker

## 2023-08-27 ENCOUNTER — Encounter (INDEPENDENT_AMBULATORY_CARE_PROVIDER_SITE_OTHER): Payer: Self-pay

## 2023-08-27 DIAGNOSIS — M1A49X1 Other secondary chronic gout, multiple sites, with tophus (tophi): Secondary | ICD-10-CM

## 2023-08-27 DIAGNOSIS — R112 Nausea with vomiting, unspecified: Secondary | ICD-10-CM | POA: Diagnosis not present

## 2023-08-27 DIAGNOSIS — F32A Depression, unspecified: Secondary | ICD-10-CM

## 2023-08-27 DIAGNOSIS — N179 Acute kidney failure, unspecified: Secondary | ICD-10-CM | POA: Diagnosis not present

## 2023-08-27 DIAGNOSIS — R1084 Generalized abdominal pain: Secondary | ICD-10-CM | POA: Diagnosis not present

## 2023-08-27 LAB — BASIC METABOLIC PANEL
Anion gap: 12 (ref 5–15)
BUN: 10 mg/dL (ref 6–20)
CO2: 19 mmol/L — ABNORMAL LOW (ref 22–32)
Calcium: 8.8 mg/dL — ABNORMAL LOW (ref 8.9–10.3)
Chloride: 94 mmol/L — ABNORMAL LOW (ref 98–111)
Creatinine, Ser: 0.98 mg/dL (ref 0.61–1.24)
GFR, Estimated: >60 mL/min (ref >60)
Glucose, Bld: 186 mg/dL — ABNORMAL HIGH (ref 70–99)
Potassium: 4.1 mmol/L (ref 3.5–5.1)
Sodium: 125 mmol/L — ABNORMAL LOW (ref 135–145)

## 2023-08-27 LAB — TSH: TSH: 1.288 u[IU]/mL (ref 0.350–4.500)

## 2023-08-27 LAB — SODIUM: Sodium: 129 mmol/L — ABNORMAL LOW (ref 135–145)

## 2023-08-27 LAB — SODIUM, URINE, RANDOM: Sodium, Ur: <10 mmol/L

## 2023-08-27 LAB — OSMOLALITY: Osmolality: 277 mosm/kg (ref 275–295)

## 2023-08-27 MED ORDER — NALOXONE HCL 0.4 MG/ML IJ SOLN: 0.4000 mg | Freq: Once | INTRAMUSCULAR | Status: DC

## 2023-08-27 MED ORDER — SODIUM CHLORIDE 0.9 % IV BOLUS
1000.0000 mL | Freq: Once | INTRAVENOUS | Status: AC
  Administered 2023-08-27: 1000 mL via INTRAVENOUS

## 2023-08-27 MED ORDER — PANTOPRAZOLE SODIUM 40 MG PO TBEC: 40.0000 mg | DELAYED_RELEASE_TABLET | Freq: Two times a day (BID) | ORAL | 3 refills | Status: DC

## 2023-08-27 MED ORDER — POTASSIUM CHLORIDE CRYS ER 20 MEQ PO TBCR
40.0000 meq | EXTENDED_RELEASE_TABLET | Freq: Once | ORAL | Status: AC
  Administered 2023-08-27: 40 meq via ORAL
  Filled 2023-08-27: qty 2

## 2023-08-27 MED ORDER — OXYCODONE HCL 5 MG PO TABS
5.0000 mg | ORAL_TABLET | Freq: Once | ORAL | Status: AC
  Administered 2023-08-27: 5 mg via ORAL
  Filled 2023-08-27: qty 1

## 2023-08-27 MED ORDER — SUCRALFATE 1 G PO TABS: 1.0000 g | ORAL_TABLET | Freq: Three times a day (TID) | ORAL | 0 refills | Status: DC

## 2023-08-27 MED ORDER — OXYCODONE HCL 5 MG PO TABS: 5.0000 mg | ORAL_TABLET | Freq: Four times a day (QID) | ORAL | 0 refills | Status: DC | PRN

## 2023-08-27 MED ORDER — SODIUM CHLORIDE 1 G PO TABS
2.0000 g | ORAL_TABLET | Freq: Three times a day (TID) | ORAL | Status: DC
  Administered 2023-08-27 – 2023-08-28 (×2): 2 g via ORAL
  Filled 2023-08-27 (×2): qty 2

## 2023-08-27 MED ORDER — AMLODIPINE BESYLATE 10 MG PO TABS: 10.0000 mg | ORAL_TABLET | Freq: Every day | ORAL | 1 refills | Status: DC

## 2023-08-27 MED ORDER — ONDANSETRON HCL 4 MG PO TABS: 4.0000 mg | ORAL_TABLET | Freq: Four times a day (QID) | ORAL | 1 refills | Status: DC | PRN

## 2023-08-27 NOTE — Progress Notes (Addendum)
[facility]                                                                              Richard Lopez, is a 47 y.o. male, DOB - [birth year], WUJ:[id number] Admit date - 08/24/2023    Outpatient Primary MD for the patient is [name], NP  LOS - 3  days  Chief Complaint  Patient presents with   Abdominal Pain   Vomiting       Brief summary   Patient is a 47 year old male with history of prior PE, treated, HTN, severe esophagitis per EGD in 11/2022, DM, depression was discharged from [facility] on 08/20/2023 after treatment for abdominal pain, nausea vomiting and dehydration.   Per patient since the day of discharge he had been in bed pretty much all day, still continued to have vomiting on attempting to eat.  Patient had smoked both tobacco and marijuana since discharge. Presented with acute kidney injury from dehydration.  Assessment & Plan    Principal Problem:   AKI (acute kidney injury) (HCC) -Likely from dehydration, poor oral intake -Creatinine 2.36 on admission, placed on IV fluids, improved to 0.89   Active Problems:   Nausea with vomiting -Patient has history of severe erosive esophagitis, esophageal ulcers, gastritis found on EGD 11/2022 -Recent HIDA scan negative for acute cholecystitis -Continue PPI, Carafate -Tolerated full liquid diet, was advanced to heart healthy diet this morning -If not able to tolerate, will consult GI.  I recommended him yesterday about gastroenterology consultation however patient declined stating that they will recommend repeat EGD and he has dental caries and he is worried about his teeth.    Hyponatremia -Secondary to dehydration -Presented with sodium of 126 -Improving to 131 Addendum - Recheck labs today showed sodium dropped to 125 -Will give fluid bolus and recheck sodium at 7PM -Obtain serum osmolality, urine osmolality, UNA, TSH     Transaminitis -Resolved  Estimated body mass index is 25.22  kg/m as calculated from the following:   Height as of this encounter: 6\' 6"  (1.981 m).   Weight as of this encounter: 99 kg.  Code Status: Full code DVT Prophylaxis:  heparin injection 5,000 Units Start: 08/25/23 0600 SCDs Start: 08/24/23 1835   Level of Care: Level of care: Telemetry Family Communication: Updated patient' Disposition Plan:      Remains inpatient appropriate:   If tolerated lunch, will discharge home   Procedures:    Consultants:     Antimicrobials:   Anti-infectives (From admission, onward)    None          Medications  amLODipine  10 mg Oral Daily   heparin  5,000 Units Subcutaneous Q8H   naLOXone (NARCAN)  injection  0.4 mg Intravenous Once   pantoprazole (PROTONIX) IV  40 mg Intravenous Q12H   sodium chloride flush  3 mL Intravenous Q12H   sucralfate  1 g Oral TID WC & HS      Subjective:   Richard Lopez was seen and examined today am.  Patient tolerating full liquid diet and looking forward to eating solid diet.  No nausea vomiting, chest pain or shortness  of breath, no abdominal pain.  Objective:   Vitals:   08/26/23 1519 08/26/23 1943 08/27/23 0504 08/27/23 1201  BP: (!) 140/96 (!) 145/92 (!) 168/100 (!) 156/95  Pulse: 76 79 92 94  Resp:  19 17 18   Temp: 98.7 F (37.1 C) 98.2 F (36.8 C) 98.1 F (36.7 C) 98.5 F (36.9 C)  TempSrc:  Oral Oral Oral  SpO2: 100% 100% 100% 100%  Weight:      Height:        Intake/Output Summary (Last 24 hours) at 08/27/2023 1303 Last data filed at 08/26/2023 2300 Gross per 24 hour  Intake --  Output 700 ml  Net -700 ml     Wt Readings from Last 3 Encounters:  08/24/23 99 kg  08/20/23 99.9 kg  05/21/23 101.3 kg   Physical Exam General: Alert and oriented x 3, NAD Cardiovascular: S1 S2 clear, RRR.  Respiratory: CTAB, no wheezing, rales or rhonchi Gastrointestinal: Soft, nontender, nondistended, NBS Ext: no pedal edema bilaterally Neuro: no new deficits Psych: Normal affect     Data Reviewed:  I have personally reviewed following labs    CBC Lab Results  Component Value Date   WBC 4.2 08/26/2023   RBC 4.02 (L) 08/26/2023   HGB 12.2 (L) 08/26/2023   HCT 35.0 (L) 08/26/2023   MCV 87.1 08/26/2023   MCH 30.3 08/26/2023   PLT 370 08/26/2023   MCHC 34.9 08/26/2023   RDW 13.2 08/26/2023   LYMPHSABS 1.6 08/20/2023   MONOABS 0.4 08/20/2023   EOSABS 0.1 08/20/2023   BASOSABS 0.0 08/20/2023     Last metabolic panel Lab Results  Component Value Date   NA 131 (L) 08/26/2023   K 3.3 (L) 08/26/2023   CL 96 (L) 08/26/2023   CO2 22 08/26/2023   BUN 14 08/26/2023   CREATININE 0.89 08/26/2023   GLUCOSE 125 (H) 08/26/2023   GFRNONAA >60 08/26/2023   GFRAA 132 07/16/2020   CALCIUM 9.2 08/26/2023   PHOS 2.4 (L) 08/19/2023   PROT 7.9 08/26/2023   ALBUMIN 3.8 08/26/2023   LABGLOB 3.3 07/16/2020   AGRATIO 1.3 07/16/2020   BILITOT 0.9 08/26/2023   ALKPHOS 52 08/26/2023   AST 34 08/26/2023   ALT 35 08/26/2023   ANIONGAP 13 08/26/2023    CBG (last 3)  No results for input(s): "GLUCAP" in the last 72 hours.    Coagulation Profile: Recent Labs  Lab 08/25/23 0618  INR 1.1     Radiology Studies: I have personally reviewed the imaging studies  DG Knee 1-2 Views Left  Result Date: 08/25/2023 CLINICAL DATA:  Chronic bilateral knee pain. EXAM: LEFT KNEE - 1-2 VIEW COMPARISON:  Left knee radiographs 08/05/2023 and 04/17/2020 FINDINGS: Severe lateral compartment joint space narrowing bone-on-bone contact. Moderate peripheral mediolateral compartment degenerative osteophytes. Moderate patellofemoral joint space narrowing with mild peripheral osteophytosis. Moderate joint effusion. Serpiginous centrally lucent and peripherally sclerotic bone infarct again seen within the proximal tibial metaphysis. Mild-to-moderate atherosclerotic calcifications. IMPRESSION: 1. 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 2. Moderate joint  effusion. 3. 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 Electronically Signed   By: Neita Garnet M.D.   On: 08/25/2023 16:17   DG Knee 1-2 Views Right  Result Date: 08/25/2023 CLINICAL DATA:  Bilateral knee pain, chronic. EXAM: RIGHT KNEE - 1-2 VIEW COMPARISON:  Right knee radiographs 08/05/2023 FINDINGS: Redemonstration of partial visualization of the distal aspect of femoral intramedullary nail with two distal interlocking screws. Severe lateral compartment joint  space narrowing with bone-on-bone contact, subchondral sclerosis, and large peripheral osteophytes. Moderate medial compartment joint space narrowing and peripheral osteophytosis. Moderate patellofemoral joint space narrowing and osteophytosis. Large joint effusion. There is serpiginous centrally lucent and peripherally sclerotic densities throughout the visualized proximal tibial metaphysis and diaphysis, suggesting bone infarcts. Moderate atherosclerotic calcifications. IMPRESSION: 1. Severe lateral compartment and moderate medial and patellofemoral compartment osteoarthritis. 2. Large joint effusion, similar to prior. 3. 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 Electronically Signed   By: Neita Garnet M.D.   On: 08/25/2023 16:16       Lafawn Lenoir M.D. [facility] 08/27/2023, 1:03 PM  Available via Epic secure chat 7am-7pm After 7 pm, please refer to night coverage provider listed on amion.

## 2023-08-27 NOTE — TOC Progression Note (Signed)
Transition of Care [facility]) - Progression Note    Patient Details  Name: Richard Lopez MRN: [id number] Date of Birth: [birth year]  Transition of Care [facility]) CM/SW Contact  Otelia Santee, LCSW Phone Number: 08/27/2023, 9:48 AM  Clinical Narrative:    Referral made for outpatient OT at discharge.         Expected Discharge Plan and Services                                               Social Determinants of Health (SDOH) Interventions SDOH Screenings   Food Insecurity: No Food Insecurity (08/24/2023)  Housing: Patient Unable To Answer (08/24/2023)  Transportation Needs: No Transportation Needs (08/24/2023)  Utilities: Not At Risk (08/24/2023)  Depression (PHQ2-9): High Risk (05/21/2023)  Financial Resource Strain: Medium Risk (11/07/2018)  Physical Activity: Unknown (11/07/2018)  Social Connections: Unknown (11/07/2018)  Stress: Stress Concern Present (11/07/2018)  Tobacco Use: Medium Risk (08/24/2023)    Readmission Risk Interventions    08/25/2023   12:27 PM 04/21/2023    2:37 PM  Readmission Risk Prevention Plan  Transportation Screening Complete Complete  PCP or Specialist Appt within 3-5 Days Complete Complete  HRI or Home Care Consult Complete Complete  Social Work Consult for Recovery Care Planning/Counseling Complete Complete  Palliative Care Screening Not Applicable Not Applicable  Medication Review Oceanographer) Complete Complete

## 2023-08-27 NOTE — Plan of Care (Signed)
  Problem: Clinical Measurements: Goal: Diagnostic test results will improve Outcome: Progressing Goal: Respiratory complications will improve Outcome: Progressing   Problem: Activity: Goal: Risk for activity intolerance will decrease Outcome: Progressing   Problem: Elimination: Goal: Will not experience complications related to urinary retention Outcome: Progressing   Problem: Skin Integrity: Goal: Risk for impaired skin integrity will decrease Outcome: Progressing

## 2023-08-27 NOTE — Discharge Summary (Signed)
Physician Discharge Summary   Patient: Richard Lopez MRN: [id number] DOB: 01-17-1976  Admit date:     08/24/2023  Discharge date: 08/27/23  Discharge Physician: Thad Ranger, MD    PCP: [name], NP   Recommendations at discharge:   Patient recommended outpatient GI reevaluation for consideration of repeat EGD due to recurrent admissions for intractable nausea and vomiting Continue Protonix 40 mg twice daily Carafate increased to 1 g 3 times daily before meals and at bedtime Salt tabs 2 g 3 times daily for 3 days  Discharge Diagnoses:    AKI (acute kidney injury) (HCC) Nausea with vomiting Acute on chronic hyponatremia   Transaminitis History of severe erosive esophagitis, esophageal ulcers, gastric ulcers    Hospital Course: Patient is a 47 year old male with history of prior PE, treated, HTN, severe esophagitis per EGD in 11/2022, DM, depression was discharged from [facility] on 08/20/2023 after treatment for abdominal pain, nausea vomiting and dehydration.   Per patient since the day of discharge he had been in bed pretty much all day, still continued to have vomiting on attempting to eat.  Patient had smoked both tobacco and marijuana since discharge. Presented with acute kidney injury from dehydration.  Assessment and Plan:   AKI (acute kidney injury) (HCC) -Likely from dehydration, poor oral intake -Creatinine 2.36 on admission,  -Patient was placed on IV fluids, creatinine improved to 0.6      Nausea with vomiting in the setting of PUD, severe erosive esophagitis -Patient has history of severe erosive esophagitis, esophageal ulcers, gastritis found on EGD 11/2022 -Recent HIDA scan negative for acute cholecystitis -Tolerating regular diet without any difficulty -  Strongly recommended to follow-up outpatient with GI for consideration of repeat EGD due to recurrent admissions for intractable nausea and vomiting Continue Protonix 40 mg twice daily, Carafate    Acute on chronic hyponatremia -Secondary to dehydration -Presented with sodium of 126 -Sodium was improving until noted to have decreased to 125 on 10/24. -Patient was given IV fluid bolus, serum osmolarity 277, urine osmolality 349, urine sodium less than 10 -Placed on salt tabs 2 g 3 times daily for 3 days -Repeat labs in 1 week     Transaminitis -Resolved   Estimated body mass index is 25.22 kg/m as calculated from the following:   Height as of this encounter: 6\' 6"  (1.981 m).   Weight as of this encounter: 99 kg.     Pain control - [employer] Controlled Substance Reporting System database was reviewed. and patient was instructed, not to drive, operate heavy machinery, perform activities at heights, swimming or participation in water activities or provide baby-sitting services while on Pain, Sleep and Anxiety Medications; until their outpatient Physician has advised to do so again. Also recommended to not to take more than prescribed Pain, Sleep and Anxiety Medications.  Consultants: None Procedures performed: None Disposition: Home Diet recommendation: Regular diet  DISCHARGE MEDICATION: Allergies as of 08/27/2023       Reactions   Ativan [lorazepam] Anxiety, Other (See Comments)   Hallucinations   Nsaids Other (See Comments)   Hx of esophageal ulcer        Medication List     STOP taking these medications    aspirin EC 81 MG tablet   POTASSIUM PO       TAKE these medications    acetaminophen 500 MG tablet Commonly known as: TYLENOL Take 1 tablet (500 mg total) by mouth daily as needed for mild pain (pain score  1-3), moderate pain (pain score 4-6), fever or headache.   amLODipine 10 MG tablet Commonly known as: NORVASC Take 1 tablet (10 mg total) by mouth daily.   feeding supplement Liqd Take 237 mLs by mouth 3 (three) times daily between meals.   multivitamin with minerals Tabs tablet Take 1 tablet by mouth daily.   ondansetron 4 MG  tablet Commonly known as: Zofran Take 1 tablet (4 mg total) by mouth every 6 (six) hours as needed for nausea or vomiting.   oxyCODONE 5 MG immediate release tablet Commonly known as: Oxy IR/ROXICODONE Take 1 tablet (5 mg total) by mouth every 6 (six) hours as needed for severe pain (pain score 7-10).   pantoprazole 40 MG tablet Commonly known as: Protonix Take 1 tablet (40 mg total) by mouth 2 (two) times daily before a meal.   sucralfate 1 g tablet Commonly known as: Carafate Take 1 tablet (1 g total) by mouth 4 (four) times daily -  with meals and at bedtime. What changed: when to take this   Voltaren 1 % Gel Generic drug: diclofenac Sodium Apply 2 g topically See admin instructions. Apply 2 grams to affected areas of legs up to 4 times a day as needed for pain               Discharge Care Instructions  (From admission, onward)           Start     Ordered   08/27/23 0000  Discharge wound care:       Comments: : If L elbow begins to drain can place Xeroform over area daily then wrap with Kerlix roll gauze and Ace bandage.    If L elbow is not draining ok to leave open to air.   08/27/23 1608            Follow-up Information     [name], NP. Schedule an appointment as soon as possible for a visit in 2 week(s).   Specialty: Internal Medicine Why: for hospital follow-up Contact information: [address] [place] [address] [phone]         Kerin Salen, MD. Schedule an appointment as soon as possible for a visit in 2 week(s).   Specialty: Gastroenterology Why: for hospital follow-up Contact information: [address] [place] [address] [phone]                Discharge Exam: Ceasar Mons Weights   08/24/23 1127  Weight: 99 kg   S:  Tolerating regular diet out any difficulty.  Looking forward to go home today.  BP 122/88 (BP Location: Right Arm)   Pulse 77   Temp 99.2 F (37.3 C) (Oral)   Resp 19    Ht 6\' 6"  (1.981 m)   Wt 99 kg   SpO2 100%   BMI 25.22 kg/m    Physical Exam General: Alert and oriented x 3, NAD Cardiovascular: S1 S2 clear, RRR.  Respiratory: CTAB, no wheezing, rales or rhonchi Gastrointestinal: Soft, nontender, nondistended, NBS Ext: no pedal edema bilaterally Neuro: no new deficits Psych: Normal affect   Condition at discharge: fair  The results of significant diagnostics from this hospitalization (including imaging, microbiology, ancillary and laboratory) are listed below for reference.   Imaging Studies: DG Knee 1-2 Views Left  Result Date: 08/25/2023 CLINICAL DATA:  Chronic bilateral knee pain. EXAM: LEFT KNEE - 1-2 VIEW COMPARISON:  Left knee radiographs 08/05/2023 and 04/17/2020 FINDINGS: Severe lateral compartment joint space  narrowing bone-on-bone contact. Moderate peripheral mediolateral compartment degenerative osteophytes. Moderate patellofemoral joint space narrowing with mild peripheral osteophytosis. Moderate joint effusion. Serpiginous centrally lucent and peripherally sclerotic bone infarct again seen within the proximal tibial metaphysis. Mild-to-moderate atherosclerotic calcifications. IMPRESSION: 1. 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 2. Moderate joint effusion. 3. 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 Electronically Signed   By: Neita Garnet M.D.   On: 08/25/2023 16:17   DG Knee 1-2 Views Right  Result Date: 08/25/2023 CLINICAL DATA:  Bilateral knee pain, chronic. EXAM: RIGHT KNEE - 1-2 VIEW COMPARISON:  Right knee radiographs 08/05/2023 FINDINGS: Redemonstration of partial visualization of the distal aspect of femoral intramedullary nail with two distal interlocking screws. Severe lateral compartment joint space narrowing with bone-on-bone contact, subchondral sclerosis, and large peripheral osteophytes. Moderate medial compartment joint space narrowing and  peripheral osteophytosis. Moderate patellofemoral joint space narrowing and osteophytosis. Large joint effusion. There is serpiginous centrally lucent and peripherally sclerotic densities throughout the visualized proximal tibial metaphysis and diaphysis, suggesting bone infarcts. Moderate atherosclerotic calcifications. IMPRESSION: 1. Severe lateral compartment and moderate medial and patellofemoral compartment osteoarthritis. 2. Large joint effusion, similar to prior. 3. 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 Electronically Signed   By: Neita Garnet M.D.   On: 08/25/2023 16:16   CT ABDOMEN PELVIS WO CONTRAST  Result Date: 08/24/2023 CLINICAL DATA:  Abdomen pain vomiting EXAM: CT ABDOMEN AND PELVIS WITHOUT CONTRAST TECHNIQUE: Multidetector CT imaging of the abdomen and pelvis was performed following the standard protocol without IV contrast. RADIATION DOSE REDUCTION: This exam was performed according to the departmental dose-optimization program which includes automated exposure control, adjustment of the mA and/or kV according to patient size and/or use of iterative reconstruction technique. COMPARISON:  08/18/2023 nuclear medicine exam, ultrasound 08/17/2023, CT 04/20/2023 FINDINGS: Lower chest: Lung bases demonstrate scarring and cysts at the posterior right base as before. Stable punctate 2 mm anterior right lung base pulmonary nodule, no imaging follow-up is recommended. Coronary vascular calcification. Hepatobiliary: Gallstones.  No biliary dilatation Pancreas: Unremarkable. No pancreatic ductal dilatation or surrounding inflammatory changes. Spleen: Normal in size without focal abnormality. Adrenals/Urinary Tract: Adrenal glands are normal. Kidneys show no hydronephrosis. The bladder is unremarkable Stomach/Bowel: Stomach is within normal limits. Appendix appears normal. No evidence of bowel wall thickening, distention, or inflammatory changes.  Vascular/Lymphatic: Moderate aortic atherosclerosis. No aneurysm. No suspicious lymph nodes Reproductive: Prostate is unremarkable. Other: Negative for pelvic effusion or free air. Musculoskeletal: Hardware in the right femur with artifact. No acute or suspicious osseous abnormality. IMPRESSION: 1. No CT evidence for acute intra-abdominal or pelvic abnormality. 2. Gallstones. 3. Aortic atherosclerosis. Aortic Atherosclerosis (ICD10-I70.0). Electronically Signed   By: Jasmine Pang M.D.   On: 08/24/2023 16:45   NM Hepato W/EF  Result Date: 08/18/2023 CLINICAL DATA:  Cholelithiasis. EXAM: NUCLEAR MEDICINE HEPATOBILIARY IMAGING WITH GALLBLADDER EF TECHNIQUE: Sequential images of the abdomen were obtained out to 60 minutes following intravenous administration of radiopharmaceutical. After oral ingestion of Ensure, gallbladder ejection fraction was determined. At 60 min, normal ejection fraction is greater than 33%. RADIOPHARMACEUTICALS:  5.4 mCi Tc-20m  Choletec IV COMPARISON:  Ultrasound 08/17/2019 FINDINGS: Prompt uptake and biliary excretion of activity by the liver is seen. Gallbladder activity is visualized, consistent with patency of cystic duct. Biliary activity passes into small bowel, consistent with patent common bile duct. Calculated gallbladder ejection fraction is 66%. (Normal gallbladder ejection fraction with Ensure is greater than 33% and less than 80%.)  IMPRESSION: Ejection fraction of the gallbladder normal at 66%. No common duct or cystic duct obstruction. Electronically Signed   By: Karen Kays M.D.   On: 08/18/2023 17:37   US Abdomen Limited RUQ (LIVER/GB)  Result Date: 08/17/2023 CLINICAL DATA:  LFT elevation. EXAM: ULTRASOUND ABDOMEN LIMITED RIGHT UPPER QUADRANT COMPARISON:  CT 04/20/2023.  Ultrasound 11/08/2022 FINDINGS: Gallbladder: Distended gallbladder with multiple shadowing stones. Wall thickness of up to 4 mm, slightly thickened. No adjacent fluid. No reported sonographic  Murphy's sign Common bile duct: Diameter: 2 mm Liver: Diffusely echogenic hepatic parenchyma consistent with fatty liver infiltration. Portal vein is patent on color Doppler imaging with normal direction of blood flow towards the liver. Other: None. IMPRESSION: Distended gallbladder with stones. Slight wall thickening. Please correlate for other clinical signs of acute cholecystitis and if needed HIDA scan. No biliary ductal dilatation. Fatty liver infiltration Electronically Signed   By: Karen Kays M.D.   On: 08/17/2023 09:45    Microbiology: Results for orders placed or performed during the hospital encounter of 08/24/23  Urine Culture     Status: Abnormal   Collection Time: 08/24/23  4:47 PM   Specimen: Urine, Random  Result Value Ref Range Status   Specimen Description   Final    URINE, RANDOM Performed at [facility], [address] [place], [place] [address]    Special Requests   Final    NONE Reflexed from [id number] Performed at [facility], [address] [place], [place] [address]    Culture (A)  Final    <10,000 COLONIES/mL INSIGNIFICANT GROWTH Performed at [facility], [address] [place], [place] [address]    Report Status 08/25/2023 FINAL  Final    Labs: CBC: Recent Labs  Lab 08/24/23 1130 08/24/23 2036 08/25/23 0618 08/26/23 0539  WBC 5.9 6.0 5.1 4.2  HGB 13.9 12.7* 12.4* 12.2*  HCT 39.0 36.8* 35.8* 35.0*  MCV 87.6 89.1 87.3 87.1  PLT 354 329 355 370   Basic Metabolic Panel: Recent Labs  Lab 08/24/23 1130 08/24/23 2036 08/25/23 0618 08/26/23 0539 08/27/23 1349  NA 126*  --  126* 131* 125*  K 3.3*  --  3.2* 3.3* 4.1  CL 87*  --  91* 96* 94*  CO2 20*  --  21* 22 19*  GLUCOSE 158*  --  176* 125* 186*  BUN 17  --  20 14 10   CREATININE 2.82* 2.36* 1.41* 0.89 0.98  CALCIUM 10.1  --  9.2 9.2 8.8*   Liver Function Tests: Recent Labs  Lab 08/24/23 1130 08/26/23 0539  AST 47* 34  ALT 49* 35  ALKPHOS  67 52  BILITOT 0.8 0.9  PROT 9.7* 7.9  ALBUMIN 4.8 3.8   CBG: No results for input(s): "GLUCAP" in the last 168 hours.  Discharge time spent: greater than 30 minutes.  Signed: Thad Ranger, MD [facility] 08/27/2023

## 2023-08-27 NOTE — Progress Notes (Signed)
Mobility Specialist - Progress Note   08/27/23 1334  Mobility  Activity Ambulated with assistance in hallway  Level of Assistance Modified independent, requires aide device or extra time  Assistive Device Front wheel walker  Distance Ambulated (ft) 500 ft  Activity Response Tolerated well  Mobility Referral Yes  $Mobility charge 1 Mobility  Mobility Specialist Start Time (ACUTE ONLY) 0125  Mobility Specialist Stop Time (ACUTE ONLY) 0133  Mobility Specialist Time Calculation (min) (ACUTE ONLY) 8 min   Pt received EOB and agreeable to mobility. No complaints during session. Pt to bed after session with all needs met.    [facility]

## 2023-08-27 NOTE — Plan of Care (Signed)

## 2023-08-28 DIAGNOSIS — M1A49X1 Other secondary chronic gout, multiple sites, with tophus (tophi): Secondary | ICD-10-CM | POA: Diagnosis not present

## 2023-08-28 DIAGNOSIS — R112 Nausea with vomiting, unspecified: Secondary | ICD-10-CM | POA: Diagnosis not present

## 2023-08-28 DIAGNOSIS — R1084 Generalized abdominal pain: Secondary | ICD-10-CM | POA: Diagnosis not present

## 2023-08-28 DIAGNOSIS — N179 Acute kidney failure, unspecified: Secondary | ICD-10-CM | POA: Diagnosis not present

## 2023-08-28 LAB — BASIC METABOLIC PANEL
Anion gap: 11 (ref 5–15)
BUN: 6 mg/dL (ref 6–20)
CO2: 22 mmol/L (ref 22–32)
Calcium: 9 mg/dL (ref 8.9–10.3)
Chloride: 97 mmol/L — ABNORMAL LOW (ref 98–111)
Creatinine, Ser: 0.67 mg/dL (ref 0.61–1.24)
GFR, Estimated: >60 mL/min (ref >60)
Glucose, Bld: 106 mg/dL — ABNORMAL HIGH (ref 70–99)
Potassium: 3.7 mmol/L (ref 3.5–5.1)
Sodium: 130 mmol/L — ABNORMAL LOW (ref 135–145)

## 2023-08-28 LAB — CBC
HCT: 34 % — ABNORMAL LOW (ref 39.0–52.0)
Hemoglobin: 11.7 g/dL — ABNORMAL LOW (ref 13.0–17.0)
MCH: 30.6 pg (ref 26.0–34.0)
MCHC: 34.4 g/dL (ref 30.0–36.0)
MCV: 89 fL (ref 80.0–100.0)
Platelets: 374 10*3/uL (ref 150–400)
RBC: 3.82 MIL/uL — ABNORMAL LOW (ref 4.22–5.81)
RDW: 13.2 % (ref 11.5–15.5)
WBC: 5.1 10*3/uL (ref 4.0–10.5)
nRBC: 0 % (ref 0.0–0.2)

## 2023-08-28 LAB — OSMOLALITY, URINE: Osmolality, Ur: 349 mosm/kg (ref 300–900)

## 2023-08-28 MED ORDER — SODIUM CHLORIDE 1 G PO TABS: 2.0000 g | ORAL_TABLET | Freq: Three times a day (TID) | ORAL | 0 refills | Status: AC

## 2023-08-28 NOTE — Progress Notes (Signed)
AVS reviewed w/ pt who verbalized an understanding. PIV removed as documented. Pt does not have a ride available today. TOC will provide a cab voucher for pt due to fall risk. Pt has a car at home & will be able to pick up meds once he is home. Pt to main lobby - home via cab

## 2023-08-31 ENCOUNTER — Telehealth: Payer: Self-pay

## 2023-08-31 NOTE — Transitions of Care (Post Inpatient/ED Visit) (Signed)
08/31/2023  Name: Richard Lopez MRN: [id number] DOB: 16-Nov-1975  Today's TOC FU Call Status: Today's TOC FU Call Status:: Unsuccessful Call (1st Attempt) Unsuccessful Call (1st Attempt) Date: 08/31/23  Attempted to reach the patient regarding the most recent Inpatient/ED visit.  Follow Up Plan: Additional outreach attempts will be made to reach the patient to complete the Transitions of Care (Post Inpatient/ED visit) call.   Signature  Robyne Peers, RN

## 2023-09-01 ENCOUNTER — Telehealth: Payer: Self-pay

## 2023-09-01 NOTE — Transitions of Care (Post Inpatient/ED Visit) (Signed)
09/01/2023  Name: Richard Lopez MRN: [id number] DOB: 1976/03/04  Today's TOC FU Call Status: Today's TOC FU Call Status:: Successful TOC FU Call Completed Unsuccessful Call (1st Attempt) Date: 08/31/23 [facility] FU Call Complete Date: 09/01/23 Patient's Name and Date of Birth confirmed.  Transition Care Management Follow-up Telephone Call Date of Discharge: 08/28/23 Discharge Facility: [facility]) Type of Discharge: Inpatient Admission Primary Inpatient Discharge Diagnosis:: AKI How have you been since you were released from the hospital?: Better Any questions or concerns?: No  Items Reviewed: Did you receive and understand the discharge instructions provided?: Yes Medications obtained,verified, and reconciled?: Partial Review Completed Reason for Partial Mediation Review: He said he has all medications and did not have any questions about the med regime and did not need to review the med list Any new allergies since your discharge?: No Dietary orders reviewed?: Yes Type of Diet Ordered:: heart healthy, low sodium Do you have support at home?: Yes People in Home: alone  Medications Reviewed Today: Medications Reviewed Today   Medications were not reviewed in this encounter     Home Care and Equipment/Supplies: Were Home Health Services Ordered?: No Any new equipment or medical supplies ordered?: No  Functional Questionnaire: Do you need assistance with bathing/showering or dressing?: No Do you need assistance with meal preparation?: No Do you need assistance with eating?: No Do you have difficulty maintaining continence: No Do you need assistance with getting out of bed/getting out of a chair/moving?: Yes (ambulates with a cane) Do you have difficulty managing or taking your medications?: No  Follow up appointments reviewed: PCP Follow-up appointment confirmed?: Yes Date of PCP follow-up appointment?: 09/16/23 Follow-up Provider: Gwinda Passe, NP Specialist  Hospital Follow-up appointment confirmed?: Yes Date of Specialist follow-up appointment?: 09/08/23 Follow-Up Specialty Provider:: orthopedics. He also said that he rceived a call from his rheumatologist with [facility] and they want to schedule him for a CT of his right knee Do you need transportation to your follow-up appointment?: No Do you understand care options if your condition(s) worsen?: Yes-patient verbalized understanding    SIGNATURE Robyne Peers, RN

## 2023-09-08 ENCOUNTER — Ambulatory Visit: Payer: Medicaid Other | Attending: Primary Care | Admitting: Occupational Therapy

## 2023-09-08 DIAGNOSIS — M25622 Stiffness of left elbow, not elsewhere classified: Secondary | ICD-10-CM | POA: Insufficient documentation

## 2023-09-08 DIAGNOSIS — M1A49X1 Other secondary chronic gout, multiple sites, with tophus (tophi): Secondary | ICD-10-CM | POA: Insufficient documentation

## 2023-09-08 DIAGNOSIS — R600 Localized edema: Secondary | ICD-10-CM | POA: Diagnosis present

## 2023-09-08 DIAGNOSIS — M6281 Muscle weakness (generalized): Secondary | ICD-10-CM | POA: Diagnosis present

## 2023-09-08 DIAGNOSIS — M25631 Stiffness of right wrist, not elsewhere classified: Secondary | ICD-10-CM | POA: Diagnosis present

## 2023-09-08 DIAGNOSIS — M25632 Stiffness of left wrist, not elsewhere classified: Secondary | ICD-10-CM | POA: Insufficient documentation

## 2023-09-08 DIAGNOSIS — M25642 Stiffness of left hand, not elsewhere classified: Secondary | ICD-10-CM | POA: Diagnosis present

## 2023-09-08 DIAGNOSIS — M25621 Stiffness of right elbow, not elsewhere classified: Secondary | ICD-10-CM | POA: Insufficient documentation

## 2023-09-08 DIAGNOSIS — M25641 Stiffness of right hand, not elsewhere classified: Secondary | ICD-10-CM | POA: Diagnosis present

## 2023-09-08 DIAGNOSIS — R278 Other lack of coordination: Secondary | ICD-10-CM | POA: Diagnosis present

## 2023-09-08 NOTE — Therapy (Unsigned)
OUTPATIENT OCCUPATIONAL THERAPY ORTHO EVALUATION  Patient Name: Richard Lopez MRN: [id number] DOB:March 01, 1976, 47 y.o., male Today's Date: 09/09/2023  PCP: Gwinda Passe, NP REFERRING PROVIDER: Gwinda Passe, NP  END OF SESSION:  OT End of Session - 09/08/23 1540     Visit Number 1    Number of Visits 13    Date for OT Re-Evaluation 12/02/23    Authorization Type [employer] Medicaid    OT Start Time 1535    OT Stop Time 1613    OT Time Calculation (min) 38 min    Activity Tolerance Patient tolerated treatment well    Behavior During Therapy WFL for tasks assessed/performed             Past Medical History:  Diagnosis Date   Anxiety    Arthritis    hands and possibly knee   Depression    Diabetes mellitus    diet controlled   Essential hypertension    Gastritis    Gout    Normocytic anemia due to blood loss 08/21/2019   Pulmonary embolism [facility])    Past Surgical History:  Procedure Laterality Date   BIOPSY  11/12/2022   Procedure: BIOPSY;  Surgeon: Kerin Salen, MD;  Location: [facility];  Service: Gastroenterology;;   ESOPHAGOGASTRODUODENOSCOPY (EGD) WITH PROPOFOL N/A 08/14/2019   Procedure: ESOPHAGOGASTRODUODENOSCOPY (EGD) WITH PROPOFOL;  Surgeon: Charlott Rakes, MD;  Location: [facility];  Service: Endoscopy;  Laterality: N/A;   ESOPHAGOGASTRODUODENOSCOPY (EGD) WITH PROPOFOL N/A 11/12/2022   Procedure: ESOPHAGOGASTRODUODENOSCOPY (EGD) WITH PROPOFOL;  Surgeon: Kerin Salen, MD;  Location: [facility];  Service: Gastroenterology;  Laterality: N/A;   EXTERNAL FIXATION LEG Left 11/07/2018   Procedure: EXTERNAL FIXATION LEFT LOWER LEG;  Surgeon: Samson Frederic, MD;  Location: [facility];  Service: Orthopedics;  Laterality: Left;   EXTERNAL FIXATION REMOVAL Left 11/08/2018   Procedure: REMOVAL EXTERNAL FIXATION LEG;  Surgeon: Roby Lofts, MD;  Location: [facility] OR;  Service: Orthopedics;  Laterality: Left;   INTRAMEDULLARY (IM) NAIL INTERTROCHANTERIC Right 03/26/2019    Procedure: INTRAMEDULLARY (IM) NAIL INTERTROCHANTRIC;  Surgeon: Roby Lofts, MD;  Location: [facility] OR;  Service: Orthopedics;  Laterality: Right;   INTRAMEDULLARY (IM) NAIL INTERTROCHANTERIC Right 05/17/2019   Procedure: Intramedullary (Im) Nail Intertroch with circlage wiring;  Surgeon: Myrene Galas, MD;  Location: [facility] OR;  Service: Orthopedics;  Laterality: Right;   NO PAST SURGERIES     OPEN REDUCTION INTERNAL FIXATION (ORIF) TIBIA/FIBULA FRACTURE Left 11/08/2018   Procedure: OPEN REDUCTION INTERNAL FIXATION (ORIF) TIBIA/FIBULA FRACTURE;  Surgeon: Roby Lofts, MD;  Location: [facility] OR;  Service: Orthopedics;  Laterality: Left;   ORIF FEMUR FRACTURE Right 05/17/2019   Procedure: REMOVAL  OF HARDWARE;  Surgeon: Myrene Galas, MD;  Location: [facility] OR;  Service: Orthopedics;  Laterality: Right;   Patient Active Problem List   Diagnosis Date Noted   Delirium, withdrawal, alcoholic (HCC) 08/19/2023   Esophagitis 08/18/2023   Cholelithiasis 08/18/2023   Abdominal pain 08/18/2023   Dehydration with hyponatremia 08/17/2023   Depression 06/17/2023   Abnormal LFTs 04/20/2023   Hypercalcemia 04/20/2023   Hypocalcemia 11/09/2022   Acute renal failure (HCC) 11/09/2022   Elevated CPK 11/08/2022   Elevated LFTs 11/08/2022   Intractable nausea and vomiting 02/12/2021   AF (paroxysmal atrial fibrillation) (HCC)    Transaminitis    Dehydration    AKI (acute kidney injury) (HCC) 06/18/2020   Atrial fibrillation with RVR (HCC) 04/22/2020   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   Hyponatremia 04/21/2020   Aspiration pneumonia (HCC)  04/21/2020   Esophagitis determined by endoscopy 08/21/2019   Anemia 08/21/2019   Hypokalemia 08/17/2019   Hypomagnesemia 08/17/2019   Hemoptysis 08/17/2019   Acute upper GI bleed 08/14/2019   Hematemesis 08/13/2019   Cavitary lesion of lung 08/01/2019   Gout 05/18/2019   Peri-prosthetic fracture of femur at tip of prosthesis 05/16/2019   Alcohol use 05/16/2019    Intertrochanteric fracture of right hip (HCC) 04/14/2019   Vitamin D deficiency 04/14/2019   DTs (delirium tremens) (HCC) 04/07/2019   Delirium tremens (HCC) 04/07/2019   Encephalopathy acute    Closed right hip fracture, initial encounter (HCC) 03/25/2019   Alcohol dependence with intoxication (HCC) 03/25/2019   Hypertensive urgency 03/25/2019   Thrombocytopenia (HCC) 03/25/2019   Effusion, right knee 03/25/2019   Closed fracture of right femur (HCC)    High anion gap metabolic acidosis    Prediabetes 04/02/2017   Gastritis 04/02/2017   Marijuana abuse 04/01/2017   Nausea with vomiting 07/29/2014   Type 2 diabetes mellitus with hyperlipidemia (HCC) 07/29/2014   Essential hypertension 07/29/2014   Nausea & vomiting 07/29/2014    ONSET DATE: 08/27/23  REFERRING DIAG:  Diagnosis  M1A.49X1 (ICD-10-CM) - 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    THERAPY DIAG:  Localized edema - Plan: Ot plan of care cert/re-cert  Other lack of coordination - Plan: Ot plan of care cert/re-cert  Stiffness of left hand, not elsewhere classified - Plan: Ot plan of care cert/re-cert  Stiffness of right hand, not elsewhere classified - Plan: Ot plan of care cert/re-cert  Stiffness of left wrist, not elsewhere classified - Plan: Ot plan of care cert/re-cert  Stiffness of right wrist, not elsewhere classified - Plan: Ot plan of care cert/re-cert  Stiffness of right elbow, not elsewhere classified - Plan: Ot plan of care cert/re-cert  Stiffness of left elbow, not elsewhere classified - Plan: Ot plan of care cert/re-cert  Muscle weakness (generalized) - Plan: Ot plan of care cert/re-cert  Rationale for Evaluation and Treatment: Rehabilitation  SUBJECTIVE:   SUBJECTIVE STATEMENT: Pt reports he gets gout after being hospitalized Pt accompanied by: self  PERTINENT HISTORY: 47 yo with gout. Medical history of alcohol abuse, marijuana use, GERD, DM, HTN, PAF, depression. Long  history of gout in multiple joints, knees, elbows, hands, arthritis  PRECAUTIONS: Fall   PAIN:  Are you having pain? Yes: NPRS scale: 5/10 Pain location: bilateral hands  Pain description: aching Aggravating factors: movement Relieving factors: voltaren  FALLS: Has patient fallen in last 6 months? No  LIVING ENVIRONMENT: Lives with: lives alone   PLOF: Independent  PATIENT GOALS: improve function of bilateral hands  NEXT MD VISIT: 09/13/23  OBJECTIVE:  Note: Objective measures were completed at Evaluation unless otherwise noted.  HAND DOMINANCE: Right  ADLs: Overall ADLs: increased time required Transfers/ambulation related to ADLs: Eating: difficulty cutting food, needs assist Grooming: difficulty with shaving at times UB Dressing: donns shirt mod I, pt sometimes needs assist with coat LB Dressing: needs assist tying shoes Toileting: mod I with toileting Bathing: min A Tub Shower transfers: needs assist, currently does not have shower seat Equipment:  has cane  FUNCTIONAL OUTCOME MEASURES: Quick Dash: 75% disability  UPPER EXTREMITY ROM:   RUE 75% composite finger flexion, LUE grossly 40% composite finger flexion  Pt with flexion contracture at PIP/ DIP joint of L 5th digit  Active ROM Right eval Left eval  Shoulder flexion    Shoulder abduction    Shoulder adduction  Shoulder extension    Shoulder internal rotation    Shoulder external rotation    Elbow flexion    Elbow extension -35 -35  Wrist flexion 70 50  Wrist extension 35 30  Wrist ulnar deviation    Wrist radial deviation    Wrist pronation    Wrist supination    (Blank rows = not tested)  Active ROM Right eval Left eval  Thumb MCP (0-60)    Thumb IP (0-80)    Thumb Radial abd/add (0-55)     Thumb Palmar abd/add (0-45)     Thumb Opposition to Small Finger Unable to oppose ring and small  Difficult but able to perform   Index MCP (0-90) 55  60  Index PIP (0-100)     Index DIP  (0-70)      Long MCP (0-90) 65  80   Long PIP (0-100)      Long DIP (0-70)      Ring MCP (0-90) 50  70   Ring PIP (0-100)      Ring DIP (0-70)      Little MCP (0-90)  30    Little PIP (0-100)      Little DIP (0-70)      (Blank rows = not tested)     HAND FUNCTION: Grip strength: Right: 10 lbs; Left: 3 lbs  COORDINATION: 9 Hole Peg test: Right: 1 min 40 secs sec; Left: 1 min 25 secs sec   SENSATION: Light touch: Impaired mainly L hand   EDEMA: Pt with severe edema in LUE, nodules or cysts present at index, middle and small fingers of LUE and thumb and ring for right hand.   COGNITION: Overall cognitive status: Within functional limits for tasks assessed   OBSERVATIONS: Pt has severe swelling at left elbow that pt reports drains at times. Flexion contracture at PIP and DIP of left small finger   TODAY'S TREATMENT:                                                                                                                              DATE: 09/08/23- eval only    PATIENT EDUCATION: Education details: role of OT, potential goals Person educated: Patient Education method: Explanation Education comprehension: verbalized understanding  HOME EXERCISE PROGRAM: 11/5-Edema control with exercises above level of heart-see pt instructions  GOALS: Goals reviewed with patient? Yes  SHORT TERM GOALS: Target date: 10/07/23  I with HEP Baseline:dependent Goal status: INITIAL  2.  I with strategies to minimize pain and edema Baseline: dependent Goal status: INITIAL  3.  Pt will increase RUE composite finger flexion to 90% for increased functional use Baseline: 75% Goal status: INITIAL  4.  Pt will increase RUE composite finger flexion to 60% for increased functional use Baseline: 40% Goal status: INITIAL  5.  I with adapted strategies, equipment and DME to increase ease with ADLS/IADLs. Baseline: dependent Goal status: INITIAL    LONG TERM  GOALS: Target date:  12/01/22  I with updated HEP. Baseline: dependent Goal status: INITIAL  2.  Pt will improve Quick dash score to 70% or better Baseline: 75% disability Goal status: INITIAL  3.  Pt will increase RUE grip strength to at least 20 lbs for increased functional use. Baseline: 10 lbs Goal status: INITIAL  4.   Pt will increase RUE grip strength to at least 13 lbs for increased functional use. Baseline: 3 lbs Goal status: INITIAL  5.  Pt will demonstrate improved fine motor coordination as evidenced by decreasing 9 hole peg test score to 95 secs or better. Baseline: 1 min 40 secs Goal status: INITIAL  6.  Pt will demonstrate improved fine motor coordination as evidenced by decreasing 9 hole peg test score to 80 secs or better. Baseline: 1 min 25 secs Goal status: INITIAL  ASSESSMENT:  CLINICAL IMPRESSION: Patient is a 47 y.o. male who was seen today for occupational therapy evaluation for gout. Medical history of alcohol abuse, marijuana use, GERD, DM, HTN, PAF, depression. Long history of gout in multiple joints, knees, elbows, hands. Pt was hospitalized 08/24/23- 08/27/23 with esophagitis. Pt presents with the following deficits: decreased ROM, decreased coordination, pain, decreased UE functional use, and decreased strength which impedes performance of ADLs/IADLs. Pt can benefit from skilled occupational therapy to address these deficits in order to maximize pt's safety and I with daily activities.  PERFORMANCE DEFICITS: in functional skills including ADLs, IADLs, coordination, dexterity, sensation, edema, tone, ROM, strength, pain, flexibility, Fine motor control, Gross motor control, mobility, balance, endurance, decreased knowledge of precautions, decreased knowledge of use of DME, skin integrity, and UE functional use,  and psychosocial skills including coping strategies, environmental adaptation, habits, interpersonal interactions, and routines and behaviors.   IMPAIRMENTS: are  limiting patient from ADLs, IADLs, rest and sleep, education, work, play, leisure, social participation, and   COMORBIDITIES: may have co-morbidities  that affects occupational performance. Patient will benefit from skilled OT to address above impairments and improve overall function.  MODIFICATION OR ASSISTANCE TO COMPLETE EVALUATION: No modification of tasks or assist necessary to complete an evaluation.  OT OCCUPATIONAL PROFILE AND HISTORY: Detailed assessment: Review of records and additional review of physical, cognitive, psychosocial history related to current functional performance.  CLINICAL DECISION MAKING: LOW - limited treatment options, no task modification necessary  REHAB POTENTIAL: Good  EVALUATION COMPLEXITY: Low      PLAN:  OT FREQUENCY: 1x/week  OT DURATION: 12 weeks plus eval  PLANNED INTERVENTIONS: 97168 OT Re-evaluation, 97535 self care/ADL training, 09811 therapeutic exercise, 97530 therapeutic activity, 97112 neuromuscular re-education, 97140 manual therapy, 97113 aquatic therapy, 97035 ultrasound, 97018 paraffin, 91478 moist heat, 97010 cryotherapy, 97014 electrical stimulation unattended, scar mobilization, passive range of motion, compression bandaging, energy conservation, coping strategies training, patient/family education, and DME and/or AE instructions  RECOMMENDED OTHER SERVICES: n/a  CONSULTED AND AGREED WITH PLAN OF CARE: Patient  PLAN FOR NEXT SESSION: initial HEP, edema control   Aeva Posey, OT 09/09/2023, 8:37 AM

## 2023-09-08 NOTE — Patient Instructions (Signed)
Edema Reduction (Pumping Exercises)    Hold hand overhead. Squeeze fingers together, making a fist. Repeat _10___ times. Spread fingers apart then press together. Repeat __10__ times. Do _3__ sessions per day.  Copyright  VHI. All rights reserved.

## 2023-09-08 NOTE — Therapy (Incomplete)
OUTPATIENT OCCUPATIONAL THERAPY ORTHO EVALUATION  Patient Name: Richard Lopez MRN: [id number] DOB:[birth year], 47 y.o., male Today's Date: 09/08/2023  PCP: *** REFERRING PROVIDER: ***  END OF SESSION:   Past Medical History:  Diagnosis Date   Anxiety    Arthritis    hands and possibly knee   Depression    Diabetes mellitus    diet controlled   Essential hypertension    Gastritis    Gout    Normocytic anemia due to blood loss 08/21/2019   Pulmonary embolism [facility])    Past Surgical History:  Procedure Laterality Date   BIOPSY  11/12/2022   Procedure: BIOPSY;  Surgeon: Kerin Salen, MD;  Location: [facility];  Service: Gastroenterology;;   ESOPHAGOGASTRODUODENOSCOPY (EGD) WITH PROPOFOL N/A 08/14/2019   Procedure: ESOPHAGOGASTRODUODENOSCOPY (EGD) WITH PROPOFOL;  Surgeon: Charlott Rakes, MD;  [facility]: [facility];  Service: Endoscopy;  Laterality: N/A;   ESOPHAGOGASTRODUODENOSCOPY (EGD) WITH PROPOFOL N/A 11/12/2022   Procedure: ESOPHAGOGASTRODUODENOSCOPY (EGD) WITH PROPOFOL;  Surgeon: Kerin Salen, MD;  [facility];  Service: Gastroenterology;  Laterality: N/A;   EXTERNAL FIXATION LEG Left 11/07/2018   Procedure: EXTERNAL FIXATION LEFT LOWER LEG;  Surgeon: Samson Frederic, MD;  [facility]: [facility];  Service: Orthopedics;  Laterality: Left;   EXTERNAL FIXATION REMOVAL Left 11/08/2018   Procedure: REMOVAL EXTERNAL FIXATION LEG;  Surgeon: Roby Lofts, MD;  [facility];  Service: Orthopedics;  Laterality: Left;   INTRAMEDULLARY (IM) NAIL INTERTROCHANTERIC Right 03/26/2019   Procedure: INTRAMEDULLARY (IM) NAIL INTERTROCHANTRIC;  Surgeon: Roby Lofts, MD;  [facility];  Service: Orthopedics;  Laterality: Right;   INTRAMEDULLARY (IM) NAIL INTERTROCHANTERIC Right 05/17/2019   Procedure: Intramedullary (Im) Nail Intertroch with circlage wiring;  Surgeon: Myrene Galas, MD;  [facility];  Service: Orthopedics;  Laterality: Right;   NO PAST SURGERIES     OPEN  REDUCTION INTERNAL FIXATION (ORIF) TIBIA/FIBULA FRACTURE Left 11/08/2018   Procedure: OPEN REDUCTION INTERNAL FIXATION (ORIF) TIBIA/FIBULA FRACTURE;  Surgeon: Roby Lofts, MD;  [facility];  Service: Orthopedics;  Laterality: Left;   ORIF FEMUR FRACTURE Right 05/17/2019   Procedure: REMOVAL  OF HARDWARE;  Surgeon: Myrene Galas, MD;  [facility];  Service: Orthopedics;  Laterality: Right;   Patient Active Problem List   Diagnosis Date Noted   Delirium, withdrawal, alcoholic (HCC) 08/19/2023   Esophagitis 08/18/2023   Cholelithiasis 08/18/2023   Abdominal pain 08/18/2023   Dehydration with hyponatremia 08/17/2023   Depression 06/17/2023   Abnormal LFTs 04/20/2023   Hypercalcemia 04/20/2023   Hypocalcemia 11/09/2022   Acute renal failure (HCC) 11/09/2022   Elevated CPK 11/08/2022   Elevated LFTs 11/08/2022   Intractable nausea and vomiting 02/12/2021   AF (paroxysmal atrial fibrillation) (HCC)    Transaminitis    Dehydration    AKI (acute kidney injury) (HCC) 06/18/2020   Atrial fibrillation with RVR (HCC) 04/22/2020   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   Hyponatremia 04/21/2020   Aspiration pneumonia (HCC) 04/21/2020   Esophagitis determined by endoscopy 08/21/2019   Anemia 08/21/2019   Hypokalemia 08/17/2019   Hypomagnesemia 08/17/2019   Hemoptysis 08/17/2019   Acute upper GI bleed 08/14/2019   Hematemesis 08/13/2019   Cavitary lesion of lung 08/01/2019   Gout 05/18/2019   Peri-prosthetic fracture of femur at tip of prosthesis 05/16/2019   Alcohol use 05/16/2019   Intertrochanteric fracture of right hip (HCC) 04/14/2019   Vitamin D deficiency 04/14/2019   DTs (delirium tremens) (HCC) 04/07/2019   Delirium tremens (HCC) 04/07/2019   Encephalopathy  acute    Closed right hip fracture, initial encounter (HCC) 03/25/2019   Alcohol dependence with intoxication (HCC) 03/25/2019   Hypertensive urgency 03/25/2019   Thrombocytopenia (HCC) 03/25/2019   Effusion, right  knee 03/25/2019   Closed fracture of right femur (HCC)    High anion gap metabolic acidosis    Prediabetes 04/02/2017   Gastritis 04/02/2017   Marijuana abuse 04/01/2017   Nausea with vomiting 07/29/2014   Type 2 diabetes mellitus with hyperlipidemia (HCC) 07/29/2014   Essential hypertension 07/29/2014   Nausea & vomiting 07/29/2014    ONSET DATE: ***  REFERRING DIAG: ***  THERAPY DIAG:  No diagnosis found.  Rationale for Evaluation and Treatment: {HABREHAB:27488}  SUBJECTIVE:   SUBJECTIVE STATEMENT: *** Pt accompanied by: {accompnied:27141}  PERTINENT HISTORY: ***  PRECAUTIONS: {Therapy precautions:24002}  RED FLAGS: {PT Red Flags:29287}   WEIGHT BEARING RESTRICTIONS: {Yes ***/No:24003}  PAIN:  Are you having pain? {OPRCPAIN:27236}  FALLS: Has patient fallen in last 6 months? {fallsyesno:27318}  LIVING ENVIRONMENT: Lives with: {OPRC lives with:25569::"lives with their family"} Lives in: {Lives in:25570} Stairs: {opstairs:27293} Has following equipment at home: {Assistive devices:23999}  PLOF: {PLOF:24004}  PATIENT GOALS: ***  NEXT MD VISIT: ***  OBJECTIVE:  Note: Objective measures were completed at Evaluation unless otherwise noted.  HAND DOMINANCE: {MISC; OT HAND DOMINANCE:3187432326}  ADLs: Overall ADLs: *** Transfers/ambulation related to ADLs: Eating: *** Grooming: *** UB Dressing: *** LB Dressing: *** Toileting: *** Bathing: *** Tub Shower transfers: *** Equipment: {equipment:25573}  FUNCTIONAL OUTCOME MEASURES: {OTFUNCTIONALMEASURES:27238}  UPPER EXTREMITY ROM:     {AROM/PROM:27142} ROM Right eval Left eval  Shoulder flexion    Shoulder abduction    Shoulder adduction    Shoulder extension    Shoulder internal rotation    Shoulder external rotation    Elbow flexion    Elbow extension    Wrist flexion    Wrist extension    Wrist ulnar deviation    Wrist radial deviation    Wrist pronation    Wrist supination    (Blank  rows = not tested)  {AROM/PROM:27142} ROM Right eval Left eval  Thumb MCP (0-60)    Thumb IP (0-80)    Thumb Radial abd/add (0-55)     Thumb Palmar abd/add (0-45)     Thumb Opposition to Small Finger     Index MCP (0-90)     Index PIP (0-100)     Index DIP (0-70)      Long MCP (0-90)      Long PIP (0-100)      Long DIP (0-70)      Ring MCP (0-90)      Ring PIP (0-100)      Ring DIP (0-70)      Little MCP (0-90)      Little PIP (0-100)      Little DIP (0-70)      (Blank rows = not tested)   UPPER EXTREMITY MMT:     MMT Right eval Left eval  Shoulder flexion    Shoulder abduction    Shoulder adduction    Shoulder extension    Shoulder internal rotation    Shoulder external rotation    Middle trapezius    Lower trapezius    Elbow flexion    Elbow extension    Wrist flexion    Wrist extension    Wrist ulnar deviation    Wrist radial deviation    Wrist pronation    Wrist supination    (Blank rows = not  tested)  HAND FUNCTION: {handfunction:27230}  COORDINATION: {otcoordination:27237}  SENSATION: {sensation:27233}  EDEMA: ***  COGNITION: Overall cognitive status: {cognition:24006} Areas of impairment: {impairedcognition:27234}  OBSERVATIONS: ***   TODAY'S TREATMENT:                                                                                                                              DATE: ***    PATIENT EDUCATION: Education details: *** Person educated: {Person educated:25204} Education method: {Education Method:25205} Education comprehension: {Education Comprehension:25206}  HOME EXERCISE PROGRAM: ***  GOALS: Goals reviewed with patient? {yes/no:20286}  SHORT TERM GOALS: Target date: ***  *** Baseline: Goal status: INITIAL  2.  *** Baseline:  Goal status: INITIAL  3.  *** Baseline:  Goal status: INITIAL  4.  *** Baseline:  Goal status: INITIAL  5.  *** Baseline:  Goal status: INITIAL  6.  *** Baseline:  Goal  status: INITIAL  LONG TERM GOALS: Target date: ***  *** Baseline:  Goal status: INITIAL  2.  *** Baseline:  Goal status: INITIAL  3.  *** Baseline:  Goal status: INITIAL  4.  *** Baseline:  Goal status: INITIAL  5.  *** Baseline:  Goal status: INITIAL  6.  *** Baseline:  Goal status: INITIAL  ASSESSMENT:  CLINICAL IMPRESSION: Patient is a *** y.o. *** who was seen today for occupational therapy evaluation for ***.   PERFORMANCE DEFICITS: in functional skills including {OT physical skills:25468}, cognitive skills including {OT cognitive skills:25469}, and psychosocial skills including {OT psychosocial skills:25470}.   IMPAIRMENTS: are limiting patient from {OT performance deficits:25471}.   COMORBIDITIES: {Comorbidities:25485} that affects occupational performance. Patient will benefit from skilled OT to address above impairments and improve overall function.  MODIFICATION OR ASSISTANCE TO COMPLETE EVALUATION: {OT modification:25474}  OT OCCUPATIONAL PROFILE AND HISTORY: {OT PROFILE AND HISTORY:25484}  CLINICAL DECISION MAKING: {OT CDM:25475}  REHAB POTENTIAL: {rehabpotential:25112}  EVALUATION COMPLEXITY: {Evaluation complexity:25115}      PLAN:  OT FREQUENCY: {rehab frequency:25116}  OT DURATION: {rehab duration:25117}  PLANNED INTERVENTIONS: {OT Interventions:25467}  RECOMMENDED OTHER SERVICES: ***  CONSULTED AND AGREED WITH PLAN OF CARE: {QIO:96295}  PLAN FOR NEXT SESSION: Keene Breath, OT 09/08/2023, 12:25 PM

## 2023-09-10 ENCOUNTER — Ambulatory Visit (INDEPENDENT_AMBULATORY_CARE_PROVIDER_SITE_OTHER): Payer: Medicaid Other | Admitting: Licensed Clinical Social Worker

## 2023-09-10 DIAGNOSIS — F32A Depression, unspecified: Secondary | ICD-10-CM

## 2023-09-15 ENCOUNTER — Ambulatory Visit: Payer: Medicaid Other | Admitting: Occupational Therapy

## 2023-09-15 ENCOUNTER — Telehealth (INDEPENDENT_AMBULATORY_CARE_PROVIDER_SITE_OTHER): Payer: Self-pay | Admitting: Primary Care

## 2023-09-15 DIAGNOSIS — R278 Other lack of coordination: Secondary | ICD-10-CM

## 2023-09-15 DIAGNOSIS — M6281 Muscle weakness (generalized): Secondary | ICD-10-CM

## 2023-09-15 DIAGNOSIS — M25631 Stiffness of right wrist, not elsewhere classified: Secondary | ICD-10-CM

## 2023-09-15 DIAGNOSIS — M25622 Stiffness of left elbow, not elsewhere classified: Secondary | ICD-10-CM

## 2023-09-15 DIAGNOSIS — R600 Localized edema: Secondary | ICD-10-CM | POA: Diagnosis not present

## 2023-09-15 DIAGNOSIS — M25621 Stiffness of right elbow, not elsewhere classified: Secondary | ICD-10-CM

## 2023-09-15 DIAGNOSIS — M25642 Stiffness of left hand, not elsewhere classified: Secondary | ICD-10-CM

## 2023-09-15 DIAGNOSIS — M25632 Stiffness of left wrist, not elsewhere classified: Secondary | ICD-10-CM

## 2023-09-15 DIAGNOSIS — M25641 Stiffness of right hand, not elsewhere classified: Secondary | ICD-10-CM

## 2023-09-15 NOTE — Telephone Encounter (Signed)
 Called to confirm pt will be at apt.

## 2023-09-15 NOTE — Therapy (Addendum)
OUTPATIENT OCCUPATIONAL THERAPY ORTHO treatment  Patient Name: Richard Lopez MRN: [id number] DOB:06/26/1976, 47 y.o., male Today's Date: 09/15/2023  PCP: Gwinda Passe, NP REFERRING PROVIDER: Gwinda Passe, NP  END OF SESSION:  OT End of Session - 09/15/23 1540     Visit Number 2    Number of Visits 13    Date for OT Re-Evaluation 12/02/23    Authorization Type [employer]    OT Start Time 1533    OT Stop Time 1613    OT Time Calculation (min) 40 min    Activity Tolerance Patient tolerated treatment well    Behavior During Therapy [facility] for tasks assessed/performed             Past Medical History:  Diagnosis Date   Anxiety    Arthritis    hands and possibly knee   Depression    Diabetes mellitus    diet controlled   Essential hypertension    Gastritis    Gout    Normocytic anemia due to blood loss 08/21/2019   Pulmonary embolism [facility])    Past Surgical History:  Procedure Laterality Date   BIOPSY  11/12/2022   Procedure: BIOPSY;  Surgeon: Kerin Salen, MD;  [facility];  Service: Gastroenterology;;   ESOPHAGOGASTRODUODENOSCOPY (EGD) WITH PROPOFOL N/A 08/14/2019   Procedure: ESOPHAGOGASTRODUODENOSCOPY (EGD) WITH PROPOFOL;  Surgeon: Charlott Rakes, MD;  [facility];  Service: Endoscopy;  Laterality: N/A;   ESOPHAGOGASTRODUODENOSCOPY (EGD) WITH PROPOFOL N/A 11/12/2022   Procedure: ESOPHAGOGASTRODUODENOSCOPY (EGD) WITH PROPOFOL;  Surgeon: Kerin Salen, MD;  [facility];  Service: Gastroenterology;  Laterality: N/A;   EXTERNAL FIXATION LEG Left 11/07/2018   Procedure: EXTERNAL FIXATION LEFT LOWER LEG;  Surgeon: Samson Frederic, MD;  [facility];  Service: Orthopedics;  Laterality: Left;   EXTERNAL FIXATION REMOVAL Left 11/08/2018   Procedure: REMOVAL EXTERNAL FIXATION LEG;  Surgeon: Roby Lofts, MD;  [facility];  Service: Orthopedics;  Laterality: Left;   INTRAMEDULLARY (IM) NAIL INTERTROCHANTERIC Right 03/26/2019    Procedure: INTRAMEDULLARY (IM) NAIL INTERTROCHANTRIC;  Surgeon: Roby Lofts, MD;  [facility];  Service: Orthopedics;  Laterality: Right;   INTRAMEDULLARY (IM) NAIL INTERTROCHANTERIC Right 05/17/2019   Procedure: Intramedullary (Im) Nail Intertroch with circlage wiring;  Surgeon: Myrene Galas, MD;  [facility];  Service: Orthopedics;  Laterality: Right;   NO PAST SURGERIES     OPEN REDUCTION INTERNAL FIXATION (ORIF) TIBIA/FIBULA FRACTURE Left 11/08/2018   Procedure: OPEN REDUCTION INTERNAL FIXATION (ORIF) TIBIA/FIBULA FRACTURE;  Surgeon: Roby Lofts, MD;  [facility];  Service: Orthopedics;  Laterality: Left;   ORIF FEMUR FRACTURE Right 05/17/2019   Procedure: REMOVAL  OF HARDWARE;  Surgeon: Myrene Galas, MD;  [facility];  Service: Orthopedics;  Laterality: Right;   Patient Active Problem List   Diagnosis Date Noted   Delirium, withdrawal, alcoholic (HCC) 08/19/2023   Esophagitis 08/18/2023   Cholelithiasis 08/18/2023   Abdominal pain 08/18/2023   Dehydration with hyponatremia 08/17/2023   Depression 06/17/2023   Abnormal LFTs 04/20/2023   Hypercalcemia 04/20/2023   Hypocalcemia 11/09/2022   Acute renal failure (HCC) 11/09/2022   Elevated CPK 11/08/2022   Elevated LFTs 11/08/2022   Intractable nausea and vomiting 02/12/2021   AF (paroxysmal atrial fibrillation) (HCC)    Transaminitis    Dehydration    AKI (acute kidney injury) (HCC) 06/18/2020   Atrial fibrillation with RVR (HCC) 04/22/2020   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   Hyponatremia 04/21/2020   Aspiration pneumonia (HCC)  04/21/2020   Esophagitis determined by endoscopy 08/21/2019   Anemia 08/21/2019   Hypokalemia 08/17/2019   Hypomagnesemia 08/17/2019   Hemoptysis 08/17/2019   Acute upper GI bleed 08/14/2019   Hematemesis 08/13/2019   Cavitary lesion of lung 08/01/2019   Gout 05/18/2019   Peri-prosthetic fracture of femur at tip of prosthesis 05/16/2019   Alcohol use 05/16/2019    Intertrochanteric fracture of right hip (HCC) 04/14/2019   Vitamin D deficiency 04/14/2019   DTs (delirium tremens) (HCC) 04/07/2019   Delirium tremens (HCC) 04/07/2019   Encephalopathy acute    Closed right hip fracture, initial encounter (HCC) 03/25/2019   Alcohol dependence with intoxication (HCC) 03/25/2019   Hypertensive urgency 03/25/2019   Thrombocytopenia (HCC) 03/25/2019   Effusion, right knee 03/25/2019   Closed fracture of right femur (HCC)    High anion gap metabolic acidosis    Prediabetes 04/02/2017   Gastritis 04/02/2017   Marijuana abuse 04/01/2017   Nausea with vomiting 07/29/2014   Type 2 diabetes mellitus with hyperlipidemia (HCC) 07/29/2014   Essential hypertension 07/29/2014   Nausea & vomiting 07/29/2014    ONSET DATE: 08/27/23  REFERRING DIAG:  Diagnosis  [id number].[id number] (ICD-10-CM) - 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 Medical history of alcohol abuse, marijuana use, GERD, DM, HTN, PAF, depression. Long history of gout in multiple joints, knees, elbows, hands, arthritis  PRECAUTIONS: Fall   PAIN:  Are you having pain? Yes: NPRS scale: 7/10 Pain location: bilateral hands  Pain description: aching Aggravating factors: movement Relieving factors: voltaren  FALLS: Has patient fallen in last 6 months? No  LIVING ENVIRONMENT: Lives with:  lives alone   PLOF: Independent  PATIENT GOALS: improve function of bilateral hands  NEXT MD VISIT: 09/13/23  OBJECTIVE:  Note: Objective measures were completed at Evaluation unless otherwise noted.  HAND DOMINANCE: Right  ADLs: Overall ADLs: increased time required Transfers/ambulation related to ADLs: Eating: difficulty cutting food, needs assist Grooming: difficulty with shaving at times UB Dressing: donns shirt mod I, pt sometimes needs assist with coat LB Dressing: needs assist tying shoes Toileting: mod I with toileting Bathing: min A Tub Shower transfers: needs assist, currently does not have shower seat Equipment:  has cane  FUNCTIONAL OUTCOME MEASURES: Quick Dash: 75% disability  UPPER EXTREMITY ROM:   RUE 75% composite finger flexion, LUE grossly 40% composite finger flexion  Pt with flexion contracture at PIP/ DIP joint of L 5th digit  Active ROM Right eval Left eval  Shoulder flexion    Shoulder abduction    Shoulder adduction    Shoulder extension    Shoulder internal rotation    Shoulder external rotation    Elbow flexion    Elbow extension -35 -35  Wrist flexion 70 50  Wrist extension 35 30  Wrist ulnar deviation    Wrist radial deviation    Wrist pronation    Wrist supination    (Blank rows = not tested)  Active ROM Right eval Left eval  Thumb MCP (0-60)    Thumb IP (0-80)    Thumb Radial abd/add (0-55)     Thumb Palmar abd/add (0-45)     Thumb Opposition to Small Finger Unable to oppose ring and small  Difficult but able to perform   Index MCP (0-90) 55  60  Index PIP (0-100)     Index DIP (0-70)      Long MCP (0-90) 65  80   Long PIP (0-100)      Long DIP (0-70)      Ring MCP (0-90) 50  70   Ring PIP (0-100)      Ring DIP (0-70)      Little MCP (0-90)  30    Little PIP (0-100)      Little DIP (0-70)      (Blank rows = not tested)     HAND FUNCTION: Grip strength: Right: 10 lbs; Left: 3 lbs  COORDINATION: 9 Hole Peg  test: Right: 1 min 40 secs sec; Left: 1 min 25 secs sec   SENSATION: Light touch: Impaired mainly L hand   EDEMA: Pt with severe edema in LUE, nodules or cysts present at index, middle and small fingers of LUE and thumb and ring for right hand.   COGNITION: Overall cognitive status: Within functional limits for tasks assessed   OBSERVATIONS: Pt has severe swelling at left elbow that pt reports drains at times. Flexion contracture at PIP and DIP of left small finger   TODAY'S TREATMENT:                                                                                                                              DATE: 09/15/23- A/ROM composite flexion, MP flexion, wrist flexion/ extension finger thumb opposition, min v.c  for bilateral UE's as tolerated 10-20 reps each with frequent rest breaks Flipping playing cards then dealing cards with thumb for bilateral UE's min v.c Ice pack to bilateral hands x 5 mins for pain and swelling  09/08/23- eval only    PATIENT EDUCATION: Education details: A/ROM exercises, flipping cards, dealing cards with thumb for functional use. Person educated: Patient Education method: Explanation, demonstration Education comprehension: verbalized understanding, returned demonstration  HOME EXERCISE PROGRAM: 11/5-Edema control with exercises above level of heart-see pt instructions  GOALS: Goals reviewed with patient? Yes  SHORT TERM GOALS: Target date: 10/07/23  I with HEP Baseline:dependent Goal status:  ongoing, initiated 09/15/23  2.  I with strategies to minimize pain and edema Baseline: dependent Goal status:  ongoing, education initiated 09/15/23  3.  Pt will increase RUE composite finger flexion to 90% for increased functional use Baseline: 75% Goal status: ongoing, 09/15/23  4.  Pt will increase LUE composite finger flexion to 60% for increased functional use (corrected/ revised- this should be LUE) Baseline: 40% Goal status:  INITIAL  5.  I with adapted strategies, equipment and DME to increase ease  with ADLS/IADLs. Baseline: dependent Goal status: INITIAL    LONG TERM GOALS: Target date: 12/01/22  I with updated HEP. Baseline: dependent Goal status: INITIAL  2.  Pt will improve Quick dash score to 70% or better Baseline: 75% disability Goal status: INITIAL  3.  Pt will increase RUE grip strength to at least 20 lbs for increased functional use. Baseline: 10 lbs Goal status: INITIAL  4.   Pt will increase LUE grip strength to at least 13 lbs for increased functional use.(Corrected/revised  - this should be LUE) Baseline: 3 lbs Goal status: INITIAL  5.  Pt will demonstrate improved fine motor coordination as evidenced by decreasing 9 hole peg test score to 95 secs or better.(RUE) Baseline: 1 min 40 secs Goal status: INITIAL  6.  Pt will demonstrate improved fine motor coordination as evidenced by decreasing 9 hole peg test score to 80 secs or better.(LUE) Baseline: 1 min 25 secs Goal status: INITIAL  ASSESSMENT:  CLINICAL IMPRESSION: Patient is progressing towards goals, He demonstrates improved ROM today and mildly decreased swelling.  PERFORMANCE DEFICITS: in functional skills including ADLs, IADLs, coordination, dexterity, sensation, edema, tone, ROM, strength, pain, flexibility, Fine motor control, Gross motor control, mobility, balance, endurance, decreased knowledge of precautions, decreased knowledge of use of DME, skin integrity, and UE functional use,  and psychosocial skills including coping strategies, environmental adaptation, habits, interpersonal interactions, and routines and behaviors.   IMPAIRMENTS: are limiting patient from ADLs, IADLs, rest and sleep, education, work, play, leisure, social participation, and   COMORBIDITIES: may have co-morbidities  that affects occupational performance. Patient will benefit from skilled OT to address above impairments and improve overall  function.  MODIFICATION OR ASSISTANCE TO COMPLETE EVALUATION: No modification of tasks or assist necessary to complete an evaluation.  OT OCCUPATIONAL PROFILE AND HISTORY: Detailed assessment: Review of records and additional review of physical, cognitive, psychosocial history related to current functional performance.  CLINICAL DECISION MAKING: LOW - limited treatment options, no task modification necessary  REHAB POTENTIAL: Good  EVALUATION COMPLEXITY: Low      PLAN:  OT FREQUENCY: 1x/week  OT DURATION: 12 weeks plus eval  PLANNED INTERVENTIONS: 97168 OT Re-evaluation, 97535 self care/ADL training, 16109 therapeutic exercise, 97530 therapeutic activity, 97112 neuromuscular re-education, 97140 manual therapy, 97113 aquatic therapy, 97035 ultrasound, 97018 paraffin, 60454 moist heat, 97010 cryotherapy, 97014 electrical stimulation unattended, scar mobilization, passive range of motion, compression bandaging, energy conservation, coping strategies training, patient/family education, and DME and/or AE instructions  RECOMMENDED OTHER SERVICES: n/a  CONSULTED AND AGREED WITH PLAN OF CARE: Patient  PLAN FOR NEXT SESSION: issue HEP, progress HEP, ROM, ice   Rotunda Worden, OT 09/15/2023, 3:43 PM

## 2023-09-16 ENCOUNTER — Ambulatory Visit (INDEPENDENT_AMBULATORY_CARE_PROVIDER_SITE_OTHER): Payer: Medicaid Other | Admitting: Primary Care

## 2023-09-16 ENCOUNTER — Encounter (INDEPENDENT_AMBULATORY_CARE_PROVIDER_SITE_OTHER): Payer: Self-pay | Admitting: Primary Care

## 2023-09-16 VITALS — BP 144/88 | HR 106 | Resp 16 | Wt 226.6 lb

## 2023-09-16 DIAGNOSIS — I10 Essential (primary) hypertension: Secondary | ICD-10-CM | POA: Diagnosis not present

## 2023-09-16 DIAGNOSIS — Z09 Encounter for follow-up examination after completed treatment for conditions other than malignant neoplasm: Secondary | ICD-10-CM | POA: Diagnosis not present

## 2023-09-16 NOTE — Patient Instructions (Signed)
Schedule an appointment with Kerin Salen, MD (Gastroenterology) in 2 weeks (09/10/2023); for hospital follow-up

## 2023-09-16 NOTE — Progress Notes (Signed)
[facility]   Subjective:   Richard Lopez is a 47 y.o. male presents for hospital follow up . Presented in the ED with complaints of nausea and vomiting for last 5 days with decrease intake. .  Admit date to the hospital was 08/24/23, patient was discharged from the hospital on 08/28/23, patient was admitted for:    AKI (acute kidney injury) (HCC), Nausea with vomiting, Acute on chronic hyponatremia and   Transaminitis. Today he feels pretty good eating better. Patient has No headache, No chest pain, No abdominal pain - No Nausea, No new weakness tingling or numbness, No Cough - shortness of breath   Past Medical History:  Diagnosis Date   Anxiety    Arthritis    hands and possibly knee   Depression    Diabetes mellitus    diet controlled   Essential hypertension    Gastritis    Gout    Normocytic anemia due to blood loss 08/21/2019   Pulmonary embolism (HCC)      Allergies  Allergen Reactions   Ativan [Lorazepam] Anxiety and Other (See Comments)    Hallucinations   Nsaids Other (See Comments)    Hx of esophageal ulcer      Current Outpatient Medications on File Prior to Visit  Medication Sig Dispense Refill   acetaminophen (TYLENOL) 500 MG tablet Take 1 tablet (500 mg total) by mouth daily as needed for mild pain (pain score 1-3), moderate pain (pain score 4-6), fever or headache.     amLODipine (NORVASC) 10 MG tablet Take 1 tablet (10 mg total) by mouth daily. 30 tablet 1   feeding supplement (ENSURE ENLIVE / ENSURE PLUS) LIQD Take 237 mLs by mouth 3 (three) times daily between meals. 10000 mL 0   Multiple Vitamin (MULTIVITAMIN WITH MINERALS) TABS tablet Take 1 tablet by mouth daily. 30 tablet 0   ondansetron (ZOFRAN) 4 MG tablet Take 1 tablet (4 mg total) by mouth every 6 (six) hours as needed for nausea or vomiting. 30 tablet 1   oxyCODONE (OXY IR/ROXICODONE) 5 MG immediate release tablet Take 1 tablet (5 mg total) by mouth every 6 (six) hours as needed  for severe pain (pain score 7-10). 28 tablet 0   pantoprazole (PROTONIX) 40 MG tablet Take 1 tablet (40 mg total) by mouth 2 (two) times daily before a meal. 60 tablet 3   sucralfate (CARAFATE) 1 g tablet Take 1 tablet (1 g total) by mouth 4 (four) times daily -  with meals and at bedtime. 120 tablet 0   VOLTAREN 1 % GEL Apply 2 g topically See admin instructions. Apply 2 grams to affected areas of legs up to 4 times a day as needed for pain     No current facility-administered medications on file prior to visit.     Review of System: Comprehensive ROS Pertinent positive and negative noted in HPI    Objective:  BP (!) 142/88   Pulse (!) 106   Resp 16   Wt 226 lb 9.6 oz (102.8 kg)   SpO2 100%   BMI 26.19 kg/m   Filed Weights   09/16/23 1509  Weight: 226 lb 9.6 oz (102.8 kg)    Physical Exam: General Appearance: Well nourished, in no apparent distress. Eyes: PERRLA, EOMs, conjunctiva no swelling or erythema Sinuses: No Frontal/maxillary tenderness ENT/Mouth: Ext aud canals clear, TMs without erythema, bulging. No erythema, swelling, or exudate on post pharynx.  Tonsils not swollen or erythematous. Hearing normal.  Neck:  Supple, thyroid normal.  Respiratory: Respiratory effort normal, BS equal bilaterally without rales, rhonchi, wheezing or stridor.  Cardio: RRR with no MRGs. Brisk peripheral pulses without edema.  Abdomen: Soft, + BS.  Non tender, no guarding, rebound, hernias, masses. Lymphatics: Non tender without lymphadenopathy.  Musculoskeletal: Full ROM, 5/5 strength, normal gait.  Skin: Warm, dry without rashes, lesions, ecchymosis.  Neuro: Cranial nerves intact. Normal muscle tone, no cerebellar symptoms. Sensation intact.  Psych: Awake and oriented X 3, normal affect, Insight and Judgment appropriate.    Assessment:  Silver was seen today for hospitalization follow-up.  Diagnoses and all orders for this visit:  Hospital discharge follow-up Placed on AVS call and  schedule appt with GI   Essential hypertension  Slightly elevated admits to missing a few doses.  BP goal - < 130/80 Explained that having normal blood pressure is the goal and medications are helping to get to goal and maintain normal blood pressure. DIET: Limit salt intake, read nutrition labels to check salt content, limit fried and high fatty foods  Avoid using multisymptom OTC cold preparations that generally contain sudafed which can rise BP. Consult with pharmacist on best cold relief products to use for persons with HTN EXERCISE Discussed incorporating exercise such as walking - 30 minutes most days of the week and can do in 10 minute intervals    This note has been created with Education officer, environmental. Any transcriptional errors are unintentional.   [name], NP 09/16/2023, 3:12 PM

## 2023-09-17 NOTE — BH Specialist Note (Addendum)
Integrated Behavioral Health via Telemedicine Visit  08/27/2023 Richard Lopez [id number]  Number of Integrated Behavioral Health Clinician visits: 4- Fourth Visit  Session Start time: 0930   Session End time: 0958 Total time in minutes: 28   Referring Provider: PCP Patient/Family location: [facility]  [facility] Provider location: [facility] All persons participating in visit: Pt, LCSWA, Intern Types of Service: Individual psychotherapy and Video visit  I connected with Richard Lopez via  Telephone or Video Enabled Telemedicine Application  (Video is Caregility application) and verified that I am speaking with the correct person using two identifiers. Discussed confidentiality: Yes   I discussed the limitations of telemedicine and the availability of in person appointments.  Discussed there is a possibility of technology failure and discussed alternative modes of communication if that failure occurs.  I discussed that engaging in this telemedicine visit, they consent to the provision of behavioral healthcare and the services will be billed under their insurance.  Patient and/or legal guardian expressed understanding and consented to Telemedicine visit: Yes   Presenting Concerns: Patient and/or family reports the following symptoms/concerns: anger, sx of depression  Duration of problem: years; Severity of problem: moderate  Patient and/or Family's Strengths/Protective Factors: Willingness to seek help.  Goals Addressed: Patient will:  Reduce symptoms of: depression and stress   Increase knowledge and/or ability of: coping skills and healthy habits   Demonstrate ability to: Increase healthy adjustment to current life circumstances and Increase adequate support systems for patient/family  Progress towards Goals: Ongoing  Interventions: Interventions utilized:  Supportive Counseling Standardized Assessments completed: Not Needed  Patient and/or Family Response: Patient shared that  he was in the hospital due to some kidney issues or kidney stones.  Patient reported that he was administered medication that he previously reported he was allergic to.  Patient was extremely frustrated and was trying to get a clear plan on his health.  Assessment: Patient currently experiencing frustration and unclear about health.   Patient may benefit from follow up with PCP and specialist.  Plan: Follow up with behavioral health clinician on : 2 weeks Behavioral recommendations: make appts. Referral(s): Integrated [employer] (In Clinic)  I discussed the assessment and treatment plan with the patient and/or parent/guardian. They were provided an opportunity to ask questions and all were answered. They agreed with the plan and demonstrated an understanding of the instructions.   They were advised to call back or seek an in-person evaluation if the symptoms worsen or if the condition fails to improve as anticipated.  Vassie Loll, LCSWA
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
## 2023-09-22 ENCOUNTER — Ambulatory Visit (INDEPENDENT_AMBULATORY_CARE_PROVIDER_SITE_OTHER): Payer: Self-pay | Admitting: *Deleted

## 2023-09-22 ENCOUNTER — Ambulatory Visit: Payer: Medicaid Other | Admitting: Occupational Therapy

## 2023-09-22 ENCOUNTER — Encounter (INDEPENDENT_AMBULATORY_CARE_PROVIDER_SITE_OTHER): Payer: Self-pay | Admitting: Primary Care

## 2023-09-22 DIAGNOSIS — M25621 Stiffness of right elbow, not elsewhere classified: Secondary | ICD-10-CM

## 2023-09-22 DIAGNOSIS — M6281 Muscle weakness (generalized): Secondary | ICD-10-CM

## 2023-09-22 DIAGNOSIS — M25642 Stiffness of left hand, not elsewhere classified: Secondary | ICD-10-CM

## 2023-09-22 DIAGNOSIS — M25641 Stiffness of right hand, not elsewhere classified: Secondary | ICD-10-CM

## 2023-09-22 DIAGNOSIS — R278 Other lack of coordination: Secondary | ICD-10-CM

## 2023-09-22 DIAGNOSIS — M25632 Stiffness of left wrist, not elsewhere classified: Secondary | ICD-10-CM

## 2023-09-22 DIAGNOSIS — M25622 Stiffness of left elbow, not elsewhere classified: Secondary | ICD-10-CM

## 2023-09-22 DIAGNOSIS — M25631 Stiffness of right wrist, not elsewhere classified: Secondary | ICD-10-CM

## 2023-09-22 DIAGNOSIS — R600 Localized edema: Secondary | ICD-10-CM | POA: Diagnosis not present

## 2023-09-22 NOTE — Addendum Note (Signed)
Addended by: Keene Breath B on: 09/22/2023 02:42 PM   Modules accepted: Orders

## 2023-09-22 NOTE — Telephone Encounter (Signed)
Message from [place] C sent at 09/22/2023  9:20 AM EST  Summary: joint discomfort / rx req   The patient shares that they have a history of rheumatoid arthritis and gout concerns in their hands, elbow and knee  The patient shares that they have experienced discomfort since 09/18/23  The patient would like to be prescribed something to help with their discomfort  Please contact the patient further when possible          Call History  Contact Date/Time Type Contact Phone/Fax User  09/22/2023 09:19 AM EST Phone (Incoming) Bagent, Nautica Kreutzer (Self) [phone] Rexene Edison) Coley, Everette A   Reason for Disposition  Knee pain is a chronic symptom (recurrent or ongoing AND present > 4 weeks)    Has gout and arthritis  Answer Assessment - Initial Assessment Questions 1. LOCATION and RADIATION: "Where is the pain located?"      I returned his call.   I'm having bad arthritis and gout pain in both hands, both elbows but right elbow is much worse, and my right knee.   I broke my right knee in the past and it is really hurting.    Tylenol is not helping.    She put me on colchicine but I can't take it because it makes me sick. Can she call in something for the pain? 2. QUALITY: "What does the pain feel like?"  (e.g., sharp, dull, aching, burning)     "It's bad in my right elbow and right knee but both my hands and elbows hurt. 3. SEVERITY: "How bad is the pain?" "What does it keep you from doing?"   (Scale 1-10; or mild, moderate, severe)   -  MILD (1-3): doesn't interfere with normal activities    -  MODERATE (4-7): interferes with normal activities (e.g., work or school) or awakens from sleep, limping    -  SEVERE (8-10): excruciating pain, unable to do any normal activities, unable to walk     Severe.   I haven't been able to sleep since Friday.   Tylenol is not helping. 4. ONSET: "When did the pain start?" "Does it come and go, or is it there all the time?"     This has been a chronic problem but  lately the pain is worse. 5. RECURRENT: "Have you had this pain before?" If Yes, ask: "When, and what happened then?"     Yes   I have gout and arthritis. 6. SETTING: "Has there been any recent work, exercise or other activity that involved that part of the body?"      Not asked 7. AGGRAVATING FACTORS: "What makes the knee pain worse?" (e.g., walking, climbing stairs, running)     Daily activities.   Unable to sleep due to the pain. 8. ASSOCIATED SYMPTOMS: "Is there any swelling or redness of the knee?"     Just very painful from the arthritis and gout 9. OTHER SYMPTOMS: "Do you have any other symptoms?" (e.g., chest pain, difficulty breathing, fever, calf pain)     No 10. PREGNANCY: "Is there any chance you are pregnant?" "When was your last menstrual period?"       N/A  Protocols used: Knee Pain-A-AH

## 2023-09-22 NOTE — Telephone Encounter (Signed)
  Chief Complaint: gout and arthritis pain.   Requesting something for pain be called in. Symptoms: Pain in both hands, both elbows but right is much worse, and right knee.   Tylenol is not helping. Frequency: Daily.    Unable to sleep since Friday due to the pain. Pertinent Negatives: Patient denies Tylenol being helpful.   Colchicine makes him sick when he has taken it in the past.   Disposition: [] ED /[] Urgent Care (no appt availability in office) / [] Appointment(In office/virtual)/ []  [facility]/ [] Home Care/ [] Refused Recommended Disposition /[] [facility]/ [x]  Follow-up with PCP Additional Notes: Message sent to Gwinda Passe, NP with his request for pain medication.   Last office visit 09/16/2023.   Please send to [facility] on [address]

## 2023-09-22 NOTE — Therapy (Signed)
OUTPATIENT OCCUPATIONAL THERAPY ORTHO treatment  Patient Name: Richard Lopez MRN: [id number] DOB:1976/05/16, 47 y.o., male Today's Date: 09/22/2023  PCP: Gwinda Passe, NP REFERRING PROVIDER: Gwinda Passe, NP  END OF SESSION:  OT End of Session - 09/22/23 1622     Visit Number 3    Number of Visits 13    Date for OT Re-Evaluation 12/02/23    Authorization Type [employer]    OT Start Time 1535    OT Stop Time 1607    OT Time Calculation (min) 32 min    Activity Tolerance Patient limited by pain             Past Medical History:  Diagnosis Date   Anxiety    Arthritis    hands and possibly knee   Depression    Diabetes mellitus    diet controlled   Essential hypertension    Gastritis    Gout    Normocytic anemia due to blood loss 08/21/2019   Pulmonary embolism [facility])    Past Surgical History:  Procedure Laterality Date   BIOPSY  11/12/2022   Procedure: BIOPSY;  Surgeon: Kerin Salen, MD;  Location: [facility];  Service: Gastroenterology;;   ESOPHAGOGASTRODUODENOSCOPY (EGD) WITH PROPOFOL N/A 08/14/2019   Procedure: ESOPHAGOGASTRODUODENOSCOPY (EGD) WITH PROPOFOL;  Surgeon: Charlott Rakes, MD;  Location: [facility];  Service: Endoscopy;  Laterality: N/A;   ESOPHAGOGASTRODUODENOSCOPY (EGD) WITH PROPOFOL N/A 11/12/2022   Procedure: ESOPHAGOGASTRODUODENOSCOPY (EGD) WITH PROPOFOL;  Surgeon: Kerin Salen, MD;  Location: [facility];  Service: Gastroenterology;  Laterality: N/A;   EXTERNAL FIXATION LEG Left 11/07/2018   Procedure: EXTERNAL FIXATION LEFT LOWER LEG;  Surgeon: Samson Frederic, MD;  Location: [facility];  Service: Orthopedics;  Laterality: Left;   EXTERNAL FIXATION REMOVAL Left 11/08/2018   Procedure: REMOVAL EXTERNAL FIXATION LEG;  Surgeon: Roby Lofts, MD;  Location: [facility];  Service: Orthopedics;  Laterality: Left;   INTRAMEDULLARY (IM) NAIL INTERTROCHANTERIC Right 03/26/2019   Procedure: INTRAMEDULLARY (IM) NAIL INTERTROCHANTRIC;  Surgeon: Roby Lofts, MD;  Location: [facility];  Service: Orthopedics;  Laterality: Right;   INTRAMEDULLARY (IM) NAIL INTERTROCHANTERIC Right 05/17/2019   Procedure: Intramedullary (Im) Nail Intertroch with circlage wiring;  Surgeon: Myrene Galas, MD;  Location: [facility];  Service: Orthopedics;  Laterality: Right;   NO PAST SURGERIES     OPEN REDUCTION INTERNAL FIXATION (ORIF) TIBIA/FIBULA FRACTURE Left 11/08/2018   Procedure: OPEN REDUCTION INTERNAL FIXATION (ORIF) TIBIA/FIBULA FRACTURE;  Surgeon: Roby Lofts, MD;  Location: [facility];  Service: Orthopedics;  Laterality: Left;   ORIF FEMUR FRACTURE Right 05/17/2019   Procedure: REMOVAL  OF HARDWARE;  Surgeon: Myrene Galas, MD;  Location: [facility];  Service: Orthopedics;  Laterality: Right;   Patient Active Problem List   Diagnosis Date Noted   Delirium, withdrawal, alcoholic (HCC) 08/19/2023   Esophagitis 08/18/2023   Cholelithiasis 08/18/2023   Abdominal pain 08/18/2023   Dehydration with hyponatremia 08/17/2023   Depression 06/17/2023   Abnormal LFTs 04/20/2023   Hypercalcemia 04/20/2023   Hypocalcemia 11/09/2022   Acute renal failure (HCC) 11/09/2022   Elevated CPK 11/08/2022   Elevated LFTs 11/08/2022   Intractable nausea and vomiting 02/12/2021   AF (paroxysmal atrial fibrillation) (HCC)    Transaminitis    Dehydration    AKI (acute kidney injury) (HCC) 06/18/2020   Atrial fibrillation with RVR (HCC) 04/22/2020   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   Hyponatremia 04/21/2020   Aspiration pneumonia (HCC) 04/21/2020   Esophagitis determined by endoscopy 08/21/2019  Anemia 08/21/2019   Hypokalemia 08/17/2019   Hypomagnesemia 08/17/2019   Hemoptysis 08/17/2019   Acute upper GI bleed 08/14/2019   Hematemesis 08/13/2019   Cavitary lesion of lung 08/01/2019   Gout 05/18/2019   Peri-prosthetic fracture of femur at tip of prosthesis 05/16/2019   Alcohol use 05/16/2019   Intertrochanteric fracture of right hip (HCC) 04/14/2019   Vitamin D deficiency  04/14/2019   DTs (delirium tremens) (HCC) 04/07/2019   Delirium tremens (HCC) 04/07/2019   Encephalopathy acute    Closed right hip fracture, initial encounter (HCC) 03/25/2019   Alcohol dependence with intoxication (HCC) 03/25/2019   Hypertensive urgency 03/25/2019   Thrombocytopenia (HCC) 03/25/2019   Effusion, right knee 03/25/2019   Closed fracture of right femur (HCC)    High anion gap metabolic acidosis    Prediabetes 04/02/2017   Gastritis 04/02/2017   Marijuana abuse 04/01/2017   Nausea with vomiting 07/29/2014   Type 2 diabetes mellitus with hyperlipidemia (HCC) 07/29/2014   Essential hypertension 07/29/2014   Nausea & vomiting 07/29/2014    ONSET DATE: 08/27/23  REFERRING DIAG:  Diagnosis  M1A.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 (ICD-10-CM) - 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 Medical history of alcohol abuse, marijuana use, GERD, DM, HTN, PAF, depression. Long history of gout in multiple joints, knees, elbows, hands, arthritis  PRECAUTIONS: Fall   PAIN:  Are you having pain? Yes: NPRS scale: 8-10/10 Pain location: bilateral hands  Pain description: aching Aggravating factors: movement Relieving factors: voltaren  FALLS: Has patient fallen in last 6 months? No  LIVING ENVIRONMENT: Lives with: lives alone   PLOF: Independent  PATIENT GOALS: improve function of bilateral hands  NEXT MD  VISIT: 09/13/23  OBJECTIVE:  Note: Objective measures were completed at Evaluation unless otherwise noted.  HAND DOMINANCE: Right  ADLs: Overall ADLs: increased time required Transfers/ambulation related to ADLs: Eating: difficulty cutting food, needs assist Grooming: difficulty with shaving at times UB Dressing: donns shirt mod I, pt sometimes needs assist with coat LB Dressing: needs assist tying shoes Toileting: mod I with toileting Bathing: min A Tub Shower transfers: needs assist, currently does not have shower seat Equipment:  has cane  FUNCTIONAL OUTCOME MEASURES: Quick Dash: 75% disability  UPPER EXTREMITY ROM:   RUE 75% composite finger flexion, LUE grossly 40% composite finger flexion  Pt with flexion contracture at PIP/ DIP joint of L 5th digit  Active ROM Right eval Left eval  Shoulder flexion    Shoulder abduction    Shoulder adduction    Shoulder extension    Shoulder internal rotation    Shoulder external rotation    Elbow flexion    Elbow extension -35 -35  Wrist flexion 70 50  Wrist extension 35 30  Wrist ulnar deviation    Wrist radial deviation    Wrist pronation    Wrist supination    (Blank rows = not tested)  Active ROM Right eval Left eval  Thumb MCP (0-60)    Thumb  IP (0-80)    Thumb Radial abd/add (0-55)     Thumb Palmar abd/add (0-45)     Thumb Opposition to Small Finger Unable to oppose ring and small  Difficult but able to perform   Index MCP (0-90) 55  60  Index PIP (0-100)     Index DIP (0-70)      Long MCP (0-90) 65  80   Long PIP (0-100)      Long DIP (0-70)      Ring MCP (0-90) 50  70   Ring PIP (0-100)      Ring DIP (0-70)      Little MCP (0-90)  30    Little PIP (0-100)      Little DIP (0-70)      (Blank rows = not tested)     HAND FUNCTION: Grip strength: Right: 10 lbs; Left: 3 lbs  COORDINATION: 9 Hole Peg test: Right: 1 min 40 secs sec; Left: 1 min 25 secs sec   SENSATION: Light touch: Impaired mainly L  hand   EDEMA: Pt with severe edema in LUE, nodules or cysts present at index, middle and small fingers of LUE and thumb and ring for right hand.   COGNITION: Overall cognitive status: Within functional limits for tasks assessed   OBSERVATIONS: Pt has severe swelling at left elbow that pt reports drains at times. Flexion contracture at PIP and DIP of left small finger   TODAY'S TREATMENT:                                                                                                                              DATE: 09/22/23- Pt arrives reporting severe pain and pt's swelling in both hands and elbows is worse.[name] He has been in contact with PCP and rheumatology seeking pain management and medical management of gout. Pt perfromed the following exercises within tolerated ROM A/ROM wrist flexion/ extension, finger flexion/ extension, finger thumb opposition and elbow flexion/ extension, bilateral UE's 5-10 rps each. Ice packs applied to R elbow and hand x 6 mins and ice packs applied to L hand and elbow x 8 mins, no adverse reacitons. Pt was instructed to call to cancel next OT appointment if his pain is not better next week as we are limited in whart we can do in therapy.  09/15/23- A/ROM composite flexion, MP flexion, wrist flexion/ extension finger thumb opposition, min v.c  for bilateral UE's as tolerated 10-20 reps each with frequent rest breaks Flipping playing cards then dealing cards with thumb for bilateral UE's min v.c Ice pack to bilateral hands x 5 mins for pain and swelling  09/08/23- eval only    PATIENT EDUCATION: Education details: A/ROM exercises, flipping cards, dealing cards with thumb for functional use. Person educated: Patient Education method: Explanation, demonstration Education comprehension: verbalized understanding, returned demonstration  HOME EXERCISE PROGRAM: 11/5-Edema control with exercises above level of heart-see pt instructions  GOALS: Goals reviewed  with  patient? Yes  SHORT TERM GOALS: Target date: 10/07/23  I with HEP Baseline:dependent Goal status:  ongoing, initiated 09/15/23  2.  I with strategies to minimize pain and edema Baseline: dependent Goal status:  ongoing, education initiated 09/15/23  3.  Pt will increase RUE composite finger flexion to 90% for increased functional use Baseline: 75% Goal status: ongoing, 09/15/23  4.  Pt will increase LUE composite finger flexion to 60% for increased functional use (corrected/ revised- this should be LUE) Baseline: 40% Goal status: INITIAL  5.  I with adapted strategies, equipment and DME to increase ease with ADLS/IADLs. Baseline: dependent Goal status: INITIAL    LONG TERM GOALS: Target date: 12/01/22  I with updated HEP. Baseline: dependent Goal status: INITIAL  2.  Pt will improve Quick dash score to 70% or better Baseline: 75% disability Goal status: INITIAL  3.  Pt will increase RUE grip strength to at least 20 lbs for increased functional use. Baseline: 10 lbs Goal status: INITIAL  4.   Pt will increase LUE grip strength to at least 13 lbs for increased functional use.(Corrected/revised  - this should be LUE) Baseline: 3 lbs Goal status: INITIAL  5.  Pt will demonstrate improved fine motor coordination as evidenced by decreasing 9 hole peg test score to 95 secs or better.(RUE) Baseline: 1 min 40 secs Goal status: INITIAL  6.  Pt will demonstrate improved fine motor coordination as evidenced by decreasing 9 hole peg test score to 80 secs or better.(LUE) Baseline: 1 min 25 secs Goal status: INITIAL  ASSESSMENT:  CLINICAL IMPRESSION: Patient  progress is limited today by pain and swelling. Pt has reached out to MD for medical management.  PERFORMANCE DEFICITS: in functional skills including ADLs, IADLs, coordination, dexterity, sensation, edema, tone, ROM, strength, pain, flexibility, Fine motor control, Gross motor control, mobility, balance, endurance,  decreased knowledge of precautions, decreased knowledge of use of DME, skin integrity, and UE functional use,  and psychosocial skills including coping strategies, environmental adaptation, habits, interpersonal interactions, and routines and behaviors.   IMPAIRMENTS: are limiting patient from ADLs, IADLs, rest and sleep, education, work, play, leisure, social participation, and   COMORBIDITIES: may have co-morbidities  that affects occupational performance. Patient will benefit from skilled OT to address above impairments and improve overall function.  MODIFICATION OR ASSISTANCE TO COMPLETE EVALUATION: No modification of tasks or assist necessary to complete an evaluation.  OT OCCUPATIONAL PROFILE AND HISTORY: Detailed assessment: Review of records and additional review of physical, cognitive, psychosocial history related to current functional performance.  CLINICAL DECISION MAKING: LOW - limited treatment options, no task modification necessary  REHAB POTENTIAL: Good  EVALUATION COMPLEXITY: Low      PLAN:  OT FREQUENCY: 1x/week  OT DURATION: 12 weeks plus eval  PLANNED INTERVENTIONS: 97168 OT Re-evaluation, 97535 self care/ADL training, 25366 therapeutic exercise, 97530 therapeutic activity, 97112 neuromuscular re-education, 97140 manual therapy, 97113 aquatic therapy, 97035 ultrasound, 97018 paraffin, 44034 moist heat, 97010 cryotherapy, 97014 electrical stimulation unattended, scar mobilization, passive range of motion, compression bandaging, energy conservation, coping strategies training, patient/family education, and DME and/or AE instructions  RECOMMENDED OTHER SERVICES: n/a  CONSULTED AND AGREED WITH PLAN OF CARE: Patient  PLAN FOR NEXT SESSION: iprogress HEP, ROM, ice   Timmie Dugue, OT 09/22/2023, 4:23 PM

## 2023-09-22 NOTE — Telephone Encounter (Signed)
Will forward to provider  

## 2023-09-24 ENCOUNTER — Telehealth (INDEPENDENT_AMBULATORY_CARE_PROVIDER_SITE_OTHER): Payer: Self-pay | Admitting: Licensed Clinical Social Worker

## 2023-09-24 ENCOUNTER — Ambulatory Visit (INDEPENDENT_AMBULATORY_CARE_PROVIDER_SITE_OTHER): Payer: Medicaid Other | Admitting: Licensed Clinical Social Worker

## 2023-09-24 DIAGNOSIS — F32A Depression, unspecified: Secondary | ICD-10-CM

## 2023-09-24 NOTE — Telephone Encounter (Signed)
Please contact pt for insomnia due to pain, I have also referred him back to his rheumatologist for his current flare up. Pt has appt in December but will still call.

## 2023-09-25 NOTE — Telephone Encounter (Signed)
Please contact pt and schedule a virtual appt for insomina

## 2023-09-29 ENCOUNTER — Ambulatory Visit: Payer: Medicaid Other | Admitting: Occupational Therapy

## 2023-09-29 DIAGNOSIS — M25642 Stiffness of left hand, not elsewhere classified: Secondary | ICD-10-CM

## 2023-09-29 DIAGNOSIS — M25632 Stiffness of left wrist, not elsewhere classified: Secondary | ICD-10-CM

## 2023-09-29 DIAGNOSIS — M25641 Stiffness of right hand, not elsewhere classified: Secondary | ICD-10-CM

## 2023-09-29 DIAGNOSIS — M6281 Muscle weakness (generalized): Secondary | ICD-10-CM

## 2023-09-29 DIAGNOSIS — R600 Localized edema: Secondary | ICD-10-CM | POA: Diagnosis not present

## 2023-09-29 DIAGNOSIS — R278 Other lack of coordination: Secondary | ICD-10-CM

## 2023-09-29 DIAGNOSIS — M25621 Stiffness of right elbow, not elsewhere classified: Secondary | ICD-10-CM

## 2023-09-29 DIAGNOSIS — M25631 Stiffness of right wrist, not elsewhere classified: Secondary | ICD-10-CM

## 2023-09-29 NOTE — Patient Instructions (Addendum)
09/29/23  Dr. Fredderick Phenix,   Richard Lopez(DOB [birth year]) is being seen in occupational therapy.  We have been performing gentle A/ROM within his tolerated ROM and perfroming light functional UE  use. We have used ice pack for pain. Pain, swelling and decreased ROM remain limiting factors, however he reports significant improvement in edema following use of prednisone. Please make any additional recommendations.   Best regards,    Keene Breath, OTR/L                                                                                                                  Flexor Tendon Gliding (Active Hook Fist)   With fingers and knuckles straight, bend middle and tip joints. Do not bend large knuckles. Repeat _10-15___ times. Do _2__ sessions per day.  MP Flexion (Active)   With back of hand on table, bend large knuckles as far as they will go, keeping small joints straight. Repeat _10-15___ times. Do __2_ sessions per day. Activity: Reach into a narrow container.*      Finger Flexion / Extension   With palm up, bend fingers of left hand toward palm, making a  fist. Straighten fingers, opening fist. Repeat sequence _10-15___ times per session. Do _2_ sessions per day. Hand Variation: Palm down     Opposition (Active)   Touch tip of thumb to nail tip of each finger in turn, making an "O" shape. Repeat __10__ times. Do _2___ sessions per day.     MP Flexion (Active)   Bend thumb to touch base of little finger, keeping tip joint straight. Repeat __10-15__ times. Do _2___ sessions per day       AROM: Wrist Extension   .  With _one___ palm down, bend wrist up. Repeat __15__ times per set.  Do __2_ sessions per day.

## 2023-09-29 NOTE — Therapy (Signed)
OUTPATIENT OCCUPATIONAL THERAPY ORTHO treatment  Patient Name: Richard Lopez MRN: [id number] DOB:1976-04-19, 47 y.o., male Today's Date: 09/29/2023  PCP: Gwinda Passe, NP REFERRING PROVIDER: Gwinda Passe, NP  END OF SESSION:  OT End of Session - 09/29/23 1534     Visit Number 4    Number of Visits 13    Date for OT Re-Evaluation 12/02/23    Authorization Type [employer]    OT Start Time 1534    OT Stop Time 1615    OT Time Calculation (min) 41 min    Activity Tolerance Patient limited by pain    Behavior During Therapy [facility] for tasks assessed/performed              Past Medical History:  Diagnosis Date   Anxiety    Arthritis    hands and possibly knee   Depression    Diabetes mellitus    diet controlled   Essential hypertension    Gastritis    Gout    Normocytic anemia due to blood loss 08/21/2019   Pulmonary embolism [facility])    Past Surgical History:  Procedure Laterality Date   BIOPSY  11/12/2022   Procedure: BIOPSY;  Surgeon: Kerin Salen, MD;  Location: [facility];  Service: Gastroenterology;;   ESOPHAGOGASTRODUODENOSCOPY (EGD) WITH PROPOFOL N/A 08/14/2019   Procedure: ESOPHAGOGASTRODUODENOSCOPY (EGD) WITH PROPOFOL;  Surgeon: Charlott Rakes, MD;  Location: [facility];  Service: Endoscopy;  Laterality: N/A;   ESOPHAGOGASTRODUODENOSCOPY (EGD) WITH PROPOFOL N/A 11/12/2022   Procedure: ESOPHAGOGASTRODUODENOSCOPY (EGD) WITH PROPOFOL;  Surgeon: Kerin Salen, MD;  Location: [facility];  Service: Gastroenterology;  Laterality: N/A;   EXTERNAL FIXATION LEG Left 11/07/2018   Procedure: EXTERNAL FIXATION LEFT LOWER LEG;  Surgeon: Samson Frederic, MD;  Location: [facility];  Service: Orthopedics;  Laterality: Left;   EXTERNAL FIXATION REMOVAL Left 11/08/2018   Procedure: REMOVAL EXTERNAL FIXATION LEG;  Surgeon: Roby Lofts, MD;  Location: [facility] OR;  Service: Orthopedics;  Laterality: Left;   INTRAMEDULLARY (IM) NAIL INTERTROCHANTERIC Right 03/26/2019   Procedure:  INTRAMEDULLARY (IM) NAIL INTERTROCHANTRIC;  Surgeon: Roby Lofts, MD;  Location: [facility] OR;  Service: Orthopedics;  Laterality: Right;   INTRAMEDULLARY (IM) NAIL INTERTROCHANTERIC Right 05/17/2019   Procedure: Intramedullary (Im) Nail Intertroch with circlage wiring;  Surgeon: Myrene Galas, MD;  Location: [facility] OR;  Service: Orthopedics;  Laterality: Right;   NO PAST SURGERIES     OPEN REDUCTION INTERNAL FIXATION (ORIF) TIBIA/FIBULA FRACTURE Left 11/08/2018   Procedure: OPEN REDUCTION INTERNAL FIXATION (ORIF) TIBIA/FIBULA FRACTURE;  Surgeon: Roby Lofts, MD;  Location: [facility] OR;  Service: Orthopedics;  Laterality: Left;   ORIF FEMUR FRACTURE Right 05/17/2019   Procedure: REMOVAL  OF HARDWARE;  Surgeon: Myrene Galas, MD;  Location: [facility] OR;  Service: Orthopedics;  Laterality: Right;   Patient Active Problem List   Diagnosis Date Noted   Delirium, withdrawal, alcoholic (HCC) 08/19/2023   Esophagitis 08/18/2023   Cholelithiasis 08/18/2023   Abdominal pain 08/18/2023   Dehydration with hyponatremia 08/17/2023   Depression 06/17/2023   Abnormal LFTs 04/20/2023   Hypercalcemia 04/20/2023   Hypocalcemia 11/09/2022   Acute renal failure (HCC) 11/09/2022   Elevated CPK 11/08/2022   Elevated LFTs 11/08/2022   Intractable nausea and vomiting 02/12/2021   AF (paroxysmal atrial fibrillation) (HCC)    Transaminitis    Dehydration    AKI (acute kidney injury) (HCC) 06/18/2020   Atrial fibrillation with RVR (HCC) 04/22/2020   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   Hyponatremia 04/21/2020   Aspiration pneumonia (  HCC) 04/21/2020   Esophagitis determined by endoscopy 08/21/2019   Anemia 08/21/2019   Hypokalemia 08/17/2019   Hypomagnesemia 08/17/2019   Hemoptysis 08/17/2019   Acute upper GI bleed 08/14/2019   Hematemesis 08/13/2019   Cavitary lesion of lung 08/01/2019   Gout 05/18/2019   Peri-prosthetic fracture of femur at tip of prosthesis 05/16/2019   Alcohol use 05/16/2019   Intertrochanteric  fracture of right hip (HCC) 04/14/2019   Vitamin D deficiency 04/14/2019   DTs (delirium tremens) (HCC) 04/07/2019   Delirium tremens (HCC) 04/07/2019   Encephalopathy acute    Closed right hip fracture, initial encounter (HCC) 03/25/2019   Alcohol dependence with intoxication (HCC) 03/25/2019   Hypertensive urgency 03/25/2019   Thrombocytopenia (HCC) 03/25/2019   Effusion, right knee 03/25/2019   Closed fracture of right femur (HCC)    High anion gap metabolic acidosis    Prediabetes 04/02/2017   Gastritis 04/02/2017   Marijuana abuse 04/01/2017   Nausea with vomiting 07/29/2014   Type 2 diabetes mellitus with hyperlipidemia (HCC) 07/29/2014   Essential hypertension 07/29/2014   Nausea & vomiting 07/29/2014    ONSET DATE: 08/27/23  REFERRING DIAG:  Diagnosis  M1A.49X1 (ICD-10-CM) - 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  Rationale for Evaluation and Treatment: Rehabilitation  SUBJECTIVE:   SUBJECTIVE STATEMENT: Pt reports that his swelling is much better after starting prednisone Pt accompanied by: self  PERTINENT HISTORY: 47 yo with gout. Medical history of alcohol abuse, marijuana use, GERD, DM, HTN, PAF, depression. Long history of gout in multiple joints, knees, elbows, hands, arthritis  PRECAUTIONS: Fall   PAIN:  Are you having pain? Yes: NPRS scale: 6/10 Pain location: bilateral hands  Pain description: aching Aggravating factors: movement Relieving factors: voltaren  FALLS: Has patient fallen in last 6 months? No  LIVING ENVIRONMENT: Lives with: lives alone   PLOF: Independent  PATIENT GOALS: improve function of bilateral hands  NEXT MD VISIT: 09/13/23  OBJECTIVE:  Note: Objective measures were completed at Evaluation unless otherwise noted.  HAND DOMINANCE: Right  ADLs: Overall ADLs: increased time required Transfers/ambulation related to ADLs: Eating: difficulty cutting food,  needs assist Grooming: difficulty with shaving at times UB Dressing: donns shirt mod I, pt sometimes needs assist with coat LB Dressing: needs assist tying shoes Toileting: mod I with toileting Bathing: min A Tub Shower transfers: needs assist, currently does not have shower seat Equipment:  has cane  FUNCTIONAL OUTCOME MEASURES: Quick Dash: 75% disability  UPPER EXTREMITY ROM:   RUE 75% composite finger flexion, LUE grossly 40% composite finger flexion  Pt with flexion contracture at PIP/ DIP joint of L 5th digit  Active ROM Right eval Left eval  Shoulder flexion    Shoulder abduction    Shoulder adduction    Shoulder extension    Shoulder internal rotation    Shoulder external rotation    Elbow flexion    Elbow extension -35 -35  Wrist flexion 70 50  Wrist extension 35 30  Wrist ulnar deviation    Wrist radial deviation    Wrist pronation    Wrist supination    (Blank rows = not tested)  Active ROM Right eval Left eval  Thumb MCP (0-60)    Thumb IP (0-80)    Thumb Radial abd/add (0-55)     Thumb Palmar abd/add (0-45)     Thumb Opposition to Small Finger Unable to oppose ring and small  Difficult but able  to perform   Index MCP (0-90) 55  60  Index PIP (0-100)     Index DIP (0-70)      Long MCP (0-90) 65  80   Long PIP (0-100)      Long DIP (0-70)      Ring MCP (0-90) 50  70   Ring PIP (0-100)      Ring DIP (0-70)      Little MCP (0-90)  30    Little PIP (0-100)      Little DIP (0-70)      (Blank rows = not tested)     HAND FUNCTION: Grip strength: Right: 10 lbs; Left: 3 lbs  COORDINATION: 9 Hole Peg test: Right: 1 min 40 secs sec; Left: 1 min 25 secs sec   SENSATION: Light touch: Impaired mainly L hand   EDEMA: Pt with severe edema in LUE, nodules or cysts present at index, middle and small fingers of LUE and thumb and ring for right hand.   COGNITION: Overall cognitive status: Within functional limits for tasks assessed   OBSERVATIONS: Pt  has severe swelling at left elbow that pt reports drains at times. Flexion contracture at PIP and DIP of left small finger   TODAY'S TREATMENT:                                                                                                                              DATE: 09/29/23- Pt arrives today with less edema  A/ROM wrist flexion/ extension, finger flexion/ extension, hook fist , MP flexion, finger thumb opposition and 10 reps each. Placing and removing large pegs from pegboard with right and left UE's, min difficulty. Note given to pt. to take to MD.  09/22/23- Pt arrives reporting severe pain and pt's swelling in both hands and elbows is worse.[name] He has been in contact with PCP and rheumatology seeking pain management and medical management of gout. Pt perforemd the following exercises within tolerated ROM A/ROM wrist flexion/ extension, finger flexion/ extension, finger thumb opposition and elbow flexion/ extension, bilateral UE's 5-10 rps each. Ice packs applied to R elbow and hand x 6 mins and ice packs applied to L hand and elbow x 8 mins, no adverse reacitons. Pt was instructed to call to cancel next OT appointment if his pain is not better next week as we are limited in whart we can do in therapy.  09/15/23- A/ROM composite flexion, MP flexion, wrist flexion/ extension finger thumb opposition, min v.c  for bilateral UE's as tolerated 10-20 reps each with frequent rest breaks Flipping playing cards then dealing cards with thumb for bilateral UE's min v.c Ice pack to bilateral hands x 5 mins for pain and swelling  09/08/23- eval only    PATIENT EDUCATION: Education details: A/ROM, see pt instructions. note sent with pt. to take to MD Person educated: Patient Education method: Explanation, demonstration, handout Education comprehension: verbalized understanding, returned demonstration  HOME EXERCISE PROGRAM:  11/5-Edema control with exercises above level of heart-see pt  instructions, A/ROM  GOALS: Goals reviewed with patient? Yes  SHORT TERM GOALS: Target date: 10/07/23  I with HEP Baseline:dependent Goal status: met,09/29/23  2.  I with strategies to minimize pain and edema Baseline: dependent Goal status:  ongoing, education initiated 09/15/23  3.  Pt will increase RUE composite finger flexion to 90% for increased functional use Baseline: 75% Goal status: ongoing, 09/15/23  4.  Pt will increase LUE composite finger flexion to 60% for increased functional use (corrected/ revised- this should be LUE) Baseline: 40% Goal status: INITIAL  5.  I with adapted strategies, equipment and DME to increase ease with ADLS/IADLs. Baseline: dependent Goal status: ongoing 09/29/23    LONG TERM GOALS: Target date: 12/01/22  I with updated HEP. Baseline: dependent Goal status: INITIAL  2.  Pt will improve Quick dash score to 70% or better Baseline: 75% disability Goal status: INITIAL  3.  Pt will increase RUE grip strength to at least 20 lbs for increased functional use. Baseline: 10 lbs Goal status: INITIAL  4.   Pt will increase LUE grip strength to at least 13 lbs for increased functional use.(Corrected/revised  - this should be LUE) Baseline: 3 lbs Goal status: INITIAL  5.  Pt will demonstrate improved fine motor coordination as evidenced by decreasing 9 hole peg test score to 95 secs or better.(RUE) Baseline: 1 min 40 secs Goal status: INITIAL  6.  Pt will demonstrate improved fine motor coordination as evidenced by decreasing 9 hole peg test score to 80 secs or better.(LUE) Baseline: 1 min 25 secs Goal status: INITIAL  ASSESSMENT:  CLINICAL IMPRESSION: Patient is progressing towards goals. Pain and ROM are imrpoved today. Pt sees MD on Friday. Note sent with pt. to take to MD.  PERFORMANCE DEFICITS: in functional skills including ADLs, IADLs, coordination, dexterity, sensation, edema, tone, ROM, strength, pain, flexibility, Fine motor  control, Gross motor control, mobility, balance, endurance, decreased knowledge of precautions, decreased knowledge of use of DME, skin integrity, and UE functional use,  and psychosocial skills including coping strategies, environmental adaptation, habits, interpersonal interactions, and routines and behaviors.   IMPAIRMENTS: are limiting patient from ADLs, IADLs, rest and sleep, education, work, play, leisure, social participation, and   COMORBIDITIES: may have co-morbidities  that affects occupational performance. Patient will benefit from skilled OT to address above impairments and improve overall function.  MODIFICATION OR ASSISTANCE TO COMPLETE EVALUATION: No modification of tasks or assist necessary to complete an evaluation.  OT OCCUPATIONAL PROFILE AND HISTORY: Detailed assessment: Review of records and additional review of physical, cognitive, psychosocial history related to current functional performance.  CLINICAL DECISION MAKING: LOW - limited treatment options, no task modification necessary  REHAB POTENTIAL: Good  EVALUATION COMPLEXITY: Low      PLAN:  OT FREQUENCY: 1x/week  OT DURATION: 12 weeks plus eval  PLANNED INTERVENTIONS: 97168 OT Re-evaluation, 97535 self care/ADL training, 29562 therapeutic exercise, 97530 therapeutic activity, 97112 neuromuscular re-education, 97140 manual therapy, 97113 aquatic therapy, 97035 ultrasound, 97018 paraffin, 13086 moist heat, 97010 cryotherapy, 97014 electrical stimulation unattended, scar mobilization, passive range of motion, compression bandaging, energy conservation, coping strategies training, patient/family education, and DME and/or AE instructions  RECOMMENDED OTHER SERVICES: n/a  CONSULTED AND AGREED WITH PLAN OF CARE: Patient  PLAN FOR NEXT SESSION: progress HEP, ROM, ice, light functional use   Mardy Lucier, OT 09/29/2023, 3:43 PM

## 2023-10-07 NOTE — BH Specialist Note (Signed)
Integrated Behavioral Health via Telemedicine Visit  09/24/2023 Richard Lopez [id number]  Number of Integrated Behavioral Health Clinician visits: 6-Sixth Visit  Session Start time: 0930   Session End time: 0946  Total time in minutes: 16   Referring Provider: PCP Patient/Family location: In the car Richard Lopez Provider location: [facility] All persons participating in visit: PT,INTERN,LCSWA Types of Service: Video visit  I connected with Richard Lopez via  Telephone or Video Enabled Telemedicine Application  (Video is Caregility application) and verified that I am speaking with the correct person using two identifiers. Discussed confidentiality: Yes   I discussed the limitations of telemedicine and the availability of in person appointments.  Discussed there is a possibility of technology failure and discussed alternative modes of communication if that failure occurs.  I discussed that engaging in this telemedicine visit, they consent to the provision of behavioral healthcare and the services will be billed under their insurance.  Patient and/or legal guardian expressed understanding and consented to Telemedicine visit: Yes   Presenting Concerns: Patient and/or family reports the following symptoms/concerns: Depression,Irritability,Frustration Duration of problem: Few weeks; Severity of problem: mild  Patient and/or Family's Strengths/Protective Factors: Willingness to seek help.  Goals Addressed: Patient will:  Reduce symptoms of: depression,Stress   Increase knowledge and/or ability of: coping skills and healthy habits   Demonstrate ability to: Increase healthy adjustment to current life circumstances and Increase motivation to adhere to plan of care  Progress towards Goals: Ongoing  Interventions: Interventions utilized:  Supportive Counseling and Supportive Reflection Standardized Assessments completed: Not Needed  Patient and/or Family Response: Patient stated that he is  doing well.  Patient just wanted to check in with LCSWA and give her a life update.  Patient's next appointment is set for 2 weeks.  Assessment: Patient currently experiencing depression,irritability,frustration.  Patient may benefit from contacting legal aid for more help with housing situation.  Plan: Follow up with behavioral health clinician on : 2 weeks Behavioral recommendations: Pt session was very quick today he just wanted to give some quick life updates . He is doing well in regards to his anger and irritability. Pt stated he is just going with the flow of his living situation currently and does let people get him upset. Referral(s): [employer]:  Housing  I discussed the assessment and treatment plan with the patient and/or parent/guardian. They were provided an opportunity to ask questions and all were answered. They agreed with the plan and demonstrated an understanding of the instructions.   They were advised to call back or seek an in-person evaluation if the symptoms worsen or if the condition fails to improve as anticipated.  Richard Lopez, LCSWA

## 2023-10-09 ENCOUNTER — Telehealth (HOSPITAL_COMMUNITY): Payer: Self-pay

## 2023-10-09 NOTE — Telephone Encounter (Signed)
This therapist receives a referral from [facility] for CD-IOP for Richard Lopez, Richard Lopez.  This therapist calls Richard Lopez and obtains two identifiers to verify his identify.  Therapist explains the reason she is calling and [name] says he is not interested in any substance use services.  [name], MS, LMFT, LCAS 10/09/23

## 2023-10-15 ENCOUNTER — Ambulatory Visit (INDEPENDENT_AMBULATORY_CARE_PROVIDER_SITE_OTHER): Payer: Medicaid Other | Admitting: Licensed Clinical Social Worker

## 2023-10-19 ENCOUNTER — Encounter: Payer: Self-pay | Admitting: Hematology and Oncology

## 2023-10-19 NOTE — BH Specialist Note (Signed)
A user error has taken place: encounter opened in error, closed for administrative reasons.

## 2023-12-11 ENCOUNTER — Encounter (HOSPITAL_COMMUNITY): Payer: Self-pay

## 2023-12-11 ENCOUNTER — Observation Stay (HOSPITAL_COMMUNITY)
Admission: EM | Admit: 2023-12-11 | Discharge: 2023-12-13 | Disposition: A | Discharge status: Home / Self Care | Payer: Medicaid Other | Attending: Internal Medicine | Admitting: Internal Medicine

## 2023-12-11 ENCOUNTER — Other Ambulatory Visit: Payer: Self-pay

## 2023-12-11 DIAGNOSIS — E119 Type 2 diabetes mellitus without complications: Secondary | ICD-10-CM | POA: Insufficient documentation

## 2023-12-11 DIAGNOSIS — I10 Essential (primary) hypertension: Secondary | ICD-10-CM | POA: Insufficient documentation

## 2023-12-11 DIAGNOSIS — E860 Dehydration: Secondary | ICD-10-CM | POA: Diagnosis not present

## 2023-12-11 DIAGNOSIS — K209 Esophagitis, unspecified without bleeding: Secondary | ICD-10-CM | POA: Insufficient documentation

## 2023-12-11 DIAGNOSIS — R112 Nausea with vomiting, unspecified: Secondary | ICD-10-CM | POA: Insufficient documentation

## 2023-12-11 DIAGNOSIS — N179 Acute kidney failure, unspecified: Principal | ICD-10-CM | POA: Diagnosis present

## 2023-12-11 DIAGNOSIS — F1920 Other psychoactive substance dependence, uncomplicated: Secondary | ICD-10-CM | POA: Insufficient documentation

## 2023-12-11 DIAGNOSIS — F419 Anxiety disorder, unspecified: Secondary | ICD-10-CM | POA: Insufficient documentation

## 2023-12-11 LAB — URINALYSIS, ROUTINE W REFLEX MICROSCOPIC
Bilirubin Urine: NEGATIVE
Glucose, UA: NEGATIVE mg/dL
Ketones, ur: 5 mg/dL — AB
Nitrite: NEGATIVE
Protein, ur: 100 mg/dL — AB
Specific Gravity, Urine: 1.016 (ref 1.005–1.030)
pH: 5 (ref 5.0–8.0)

## 2023-12-11 LAB — HEMOGLOBIN A1C
Hgb A1c MFr Bld: 5.7 % — ABNORMAL HIGH (ref 4.8–5.6)
Mean Plasma Glucose: 116.89 mg/dL

## 2023-12-11 LAB — CBC
HCT: 40.1 % (ref 39.0–52.0)
Hemoglobin: 13.4 g/dL (ref 13.0–17.0)
MCH: 29.1 pg (ref 26.0–34.0)
MCHC: 33.4 g/dL (ref 30.0–36.0)
MCV: 87.2 fL (ref 80.0–100.0)
Platelets: 179 10*3/uL (ref 150–400)
RBC: 4.6 MIL/uL (ref 4.22–5.81)
RDW: 14.1 % (ref 11.5–15.5)
WBC: 5.6 10*3/uL (ref 4.0–10.5)
nRBC: 0 % (ref 0.0–0.2)

## 2023-12-11 LAB — RESP PANEL BY RT-PCR (RSV, FLU A&B, COVID)  RVPGX2
Influenza A by PCR: NEGATIVE
Influenza B by PCR: NEGATIVE
Resp Syncytial Virus by PCR: NEGATIVE
SARS Coronavirus 2 by RT PCR: NEGATIVE

## 2023-12-11 LAB — COMPREHENSIVE METABOLIC PANEL
ALT: 34 U/L (ref 0–44)
AST: 64 U/L — ABNORMAL HIGH (ref 15–41)
Albumin: 4.8 g/dL (ref 3.5–5.0)
Alkaline Phosphatase: 64 U/L (ref 38–126)
Anion gap: 21 — ABNORMAL HIGH (ref 5–15)
BUN: 22 mg/dL — ABNORMAL HIGH (ref 6–20)
CO2: 23 mmol/L (ref 22–32)
Calcium: 9.5 mg/dL (ref 8.9–10.3)
Chloride: 83 mmol/L — ABNORMAL LOW (ref 98–111)
Creatinine, Ser: 2.38 mg/dL — ABNORMAL HIGH (ref 0.61–1.24)
GFR, Estimated: 33 mL/min — ABNORMAL LOW (ref >60)
Glucose, Bld: 155 mg/dL — ABNORMAL HIGH (ref 70–99)
Potassium: 4.3 mmol/L (ref 3.5–5.1)
Sodium: 127 mmol/L — ABNORMAL LOW (ref 135–145)
Total Bilirubin: 1.1 mg/dL (ref 0.0–1.2)
Total Protein: 9.5 g/dL — ABNORMAL HIGH (ref 6.5–8.1)

## 2023-12-11 LAB — PHOSPHORUS: Phosphorus: 4.6 mg/dL (ref 2.5–4.6)

## 2023-12-11 LAB — LIPASE, BLOOD: Lipase: 34 U/L (ref 11–51)

## 2023-12-11 LAB — MAGNESIUM: Magnesium: 1.6 mg/dL — ABNORMAL LOW (ref 1.7–2.4)

## 2023-12-11 MED ORDER — ENOXAPARIN SODIUM 40 MG/0.4ML IJ SOSY
40.0000 mg | PREFILLED_SYRINGE | INTRAMUSCULAR | Status: DC
  Administered 2023-12-11 – 2023-12-12 (×2): 40 mg via SUBCUTANEOUS
  Filled 2023-12-11 (×2): qty 0.4

## 2023-12-11 MED ORDER — MAGNESIUM SULFATE 2 GM/50ML IV SOLN
2.0000 g | Freq: Once | INTRAVENOUS | Status: AC
  Administered 2023-12-12: 2 g via INTRAVENOUS
  Filled 2023-12-11: qty 50

## 2023-12-11 MED ORDER — PANTOPRAZOLE SODIUM 40 MG PO TBEC
40.0000 mg | DELAYED_RELEASE_TABLET | Freq: Two times a day (BID) | ORAL | Status: DC
  Administered 2023-12-11 – 2023-12-13 (×3): 40 mg via ORAL
  Filled 2023-12-11 (×4): qty 1

## 2023-12-11 MED ORDER — ACETAMINOPHEN 650 MG RE SUPP
650.0000 mg | Freq: Four times a day (QID) | RECTAL | Status: DC | PRN
Start: 2023-12-11 — End: 2023-12-13

## 2023-12-11 MED ORDER — ONDANSETRON HCL 4 MG/2ML IJ SOLN
4.0000 mg | Freq: Once | INTRAMUSCULAR | Status: AC
Start: 2023-12-11 — End: 2023-12-11
  Administered 2023-12-11: 4 mg via INTRAVENOUS
  Filled 2023-12-11: qty 2

## 2023-12-11 MED ORDER — AMLODIPINE BESYLATE 10 MG PO TABS
10.0000 mg | ORAL_TABLET | Freq: Every day | ORAL | Status: DC
  Administered 2023-12-11 – 2023-12-13 (×3): 10 mg via ORAL
  Filled 2023-12-11 (×2): qty 2
  Filled 2023-12-11: qty 1

## 2023-12-11 MED ORDER — SODIUM CHLORIDE 0.9 % IV SOLN: INTRAVENOUS | Status: AC

## 2023-12-11 MED ORDER — LACTATED RINGERS IV BOLUS
2000.0000 mL | Freq: Once | INTRAVENOUS | Status: AC
  Administered 2023-12-11: 2000 mL via INTRAVENOUS

## 2023-12-11 MED ORDER — ONDANSETRON HCL 4 MG PO TABS: 4.0000 mg | ORAL_TABLET | Freq: Four times a day (QID) | ORAL | Status: DC | PRN

## 2023-12-11 MED ORDER — SENNOSIDES-DOCUSATE SODIUM 8.6-50 MG PO TABS: 1.0000 | ORAL_TABLET | Freq: Every evening | ORAL | Status: DC | PRN

## 2023-12-11 MED ORDER — ACETAMINOPHEN 325 MG PO TABS
650.0000 mg | ORAL_TABLET | Freq: Four times a day (QID) | ORAL | Status: DC | PRN
  Administered 2023-12-12 – 2023-12-13 (×2): 650 mg via ORAL
  Filled 2023-12-11 (×2): qty 2

## 2023-12-11 MED ORDER — ADULT MULTIVITAMIN W/MINERALS CH
1.0000 | ORAL_TABLET | Freq: Every day | ORAL | Status: DC
  Administered 2023-12-12 – 2023-12-13 (×2): 1 via ORAL
  Filled 2023-12-11 (×2): qty 1

## 2023-12-11 MED ORDER — ONDANSETRON HCL 4 MG/2ML IJ SOLN: 4.0000 mg | Freq: Four times a day (QID) | INTRAMUSCULAR | Status: DC | PRN

## 2023-12-11 NOTE — H&P (Signed)
 History and Physical  Richard Lopez FMW:[id number] DOB: May 16, 1976 DOA: 12/11/2023  PCP: Celestia Rosaline SQUIBB, NP   Chief Complaint: Dehydration  HPI: Richard Lopez is a 48 y.o. male with medical history significant for anxiety and depression, treated PE, well-controlled T2DM, HTN, esophagitis, substance use disorder, gastritis, and gout with tophi who presents to the ED for evaluation of dehydration.  He reports that over the last week, she has been very depressed due to a personal issue. He has not been eating or drinking much water over the last week.  He reports vomiting last Saturday then again yesterday with mostly stomach acid.  He also reports having muscle cramps that started last night.  He endorsed feeling weak but denies any dizziness, chest pain, shortness of breath, abdominal pain, diarrhea or constipation. He denies any SI or HI.  ED Course: Initial vitals showed temp 98, RR 18, HR 102, BP 153/96, SpO2 98% on room air.  Labs significant for sodium 127, K+ 4.3, glucose 155, BUN/creatinine 22/2.38, normal CBC, normal lipase.  Patient received 2 L of IV LR and IV Zofran  4 mg x 1 in the ED. TR[facility] was consulted for admission  Review of Systems: Please see HPI for pertinent positives and negatives. A complete 10 system review of systems are otherwise negative.  Past Medical History:  Diagnosis Date   Anxiety    Arthritis    hands and possibly knee   Depression    Diabetes mellitus    diet controlled   Essential hypertension    Gastritis    Gout    Normocytic anemia due to blood loss 08/21/2019   Pulmonary embolism [facility])    Past Surgical History:  Procedure Laterality Date   BIOPSY  11/12/2022   Procedure: BIOPSY;  Surgeon: Saintclair Jasper, MD;  Location: [facility];  Service: Gastroenterology;;   ESOPHAGOGASTRODUODENOSCOPY (EGD) WITH PROPOFOL  N/A 08/14/2019   Procedure: ESOPHAGOGASTRODUODENOSCOPY (EGD) WITH PROPOFOL ;  Surgeon: Dianna Specking, MD;  Location: [facility];   Service: Endoscopy;  Laterality: N/A;   ESOPHAGOGASTRODUODENOSCOPY (EGD) WITH PROPOFOL  N/A 11/12/2022   Procedure: ESOPHAGOGASTRODUODENOSCOPY (EGD) WITH PROPOFOL ;  Surgeon: Saintclair Jasper, MD;  Location: [facility];  Service: Gastroenterology;  Laterality: N/A;   EXTERNAL FIXATION LEG Left 11/07/2018   Procedure: EXTERNAL FIXATION LEFT LOWER LEG;  Surgeon: Fidel Rogue, MD;  Location: [facility];  Service: Orthopedics;  Laterality: Left;   EXTERNAL FIXATION REMOVAL Left 11/08/2018   Procedure: REMOVAL EXTERNAL FIXATION LEG;  Surgeon: Kendal Franky SQUIBB, MD;  Location: [facility];  Service: Orthopedics;  Laterality: Left;   INTRAMEDULLARY (IM) NAIL INTERTROCHANTERIC Right 03/26/2019   Procedure: INTRAMEDULLARY (IM) NAIL INTERTROCHANTRIC;  Surgeon: Kendal Franky SQUIBB, MD;  Location: [facility];  Service: Orthopedics;  Laterality: Right;   INTRAMEDULLARY (IM) NAIL INTERTROCHANTERIC Right 05/17/2019   Procedure: Intramedullary (Im) Nail Intertroch with circlage wiring;  Surgeon: Celena Sharper, MD;  Location: [facility];  Service: Orthopedics;  Laterality: Right;   NO PAST SURGERIES     OPEN REDUCTION INTERNAL FIXATION (ORIF) TIBIA/FIBULA FRACTURE Left 11/08/2018   Procedure: OPEN REDUCTION INTERNAL FIXATION (ORIF) TIBIA/FIBULA FRACTURE;  Surgeon: Kendal Franky SQUIBB, MD;  Location: [facility];  Service: Orthopedics;  Laterality: Left;   ORIF FEMUR FRACTURE Right 05/17/2019   Procedure: REMOVAL  OF HARDWARE;  Surgeon: Celena Sharper, MD;  Location: [facility];  Service: Orthopedics;  Laterality: Right;   Social History:  reports that he has quit smoking. His smoking use included cigarettes. He has never used smokeless tobacco. He reports current alcohol  use of about 200.0 standard drinks of alcohol per week. He reports current drug use. Drug: Marijuana.  Allergies  Allergen Reactions   Ativan  [Lorazepam ] Anxiety and Other (See Comments)    Hallucinations   Nsaids Other (See Comments)    Hx of esophageal ulcer    Family History  Problem  Relation Age of Onset   Diabetes Mellitus II Father    Diabetes Mellitus II Other    CAD Other      Prior to Admission medications   Medication Sig Start Date End Date Taking? Authorizing Provider  acetaminophen  (TYLENOL ) 500 MG tablet Take 1 tablet (500 mg total) by mouth daily as needed for mild pain (pain score 1-3), moderate pain (pain score 4-6), fever or headache. 08/20/23   Tobie Yetta HERO, MD  amLODipine  (NORVASC ) 10 MG tablet Take 1 tablet (10 mg total) by mouth daily. 08/27/23 11/25/23  Rai, Nydia POUR, MD  feeding supplement (ENSURE ENLIVE / ENSURE PLUS) LIQD Take 237 mLs by mouth 3 (three) times daily between meals. 08/20/23   Tobie Yetta HERO, MD  Multiple Vitamin (MULTIVITAMIN WITH MINERALS) TABS tablet Take 1 tablet by mouth daily. 11/16/22   Sherrill Cable Latif, DO  ondansetron  (ZOFRAN ) 4 MG tablet Take 1 tablet (4 mg total) by mouth every 6 (six) hours as needed for nausea or vomiting. 08/27/23 08/26/24  Rai, Nydia POUR, MD  oxyCODONE  (OXY IR/ROXICODONE ) 5 MG immediate release tablet Take 1 tablet (5 mg total) by mouth every 6 (six) hours as needed for severe pain (pain score 7-10). 08/27/23   Rai, Nydia POUR, MD  pantoprazole  (PROTONIX ) 40 MG tablet Take 1 tablet (40 mg total) by mouth 2 (two) times daily before a meal. 08/27/23   Rai, Ripudeep K, MD  sucralfate  (CARAFATE ) 1 g tablet Take 1 tablet (1 g total) by mouth 4 (four) times daily -  with meals and at bedtime. 08/27/23 09/26/23  Rai, Nydia POUR, MD  VOLTAREN  1 % GEL Apply 2 g topically See admin instructions. Apply 2 grams to affected areas of legs up to 4 times a day as needed for pain    [provider]    Physical Exam: BP (!) 149/107   Pulse 87   Temp 98 F (36.7 C) (Oral)   Resp 18   Ht 6' 6 (1.981 m)   Wt 108.9 kg   SpO2 99%   BMI 27.73 kg/m  General: Pleasant, well-appearing middle-age man laying in bed. No acute distress. HEENT: [place]/AT. Anicteric sclera.  Dry mucous membrane. CV: Tachycardic.   Regular rhythm. No murmurs, rubs, or gallops. No LE edema Pulmonary: Lungs CTAB. Normal effort. No wheezing or rales. Abdominal: Soft, nontender, nondistended. Normal bowel sounds. Extremities: Multiple tophi on the finger joints with decreased ROM of the fingers. Tophi of the left elbow. Skin: Warm and dry. No obvious rash or lesions. Decreased skin turgor. Neuro: A&Ox3. Moves all extremities. Normal sensation to light touch. No focal deficit. Psych: Normal mood and affect          Labs on Admission:  Basic Metabolic Panel: Recent Labs  Lab 12/11/23 1317  NA 127*  K 4.3  CL 83*  CO2 23  GLUCOSE 155*  BUN 22*  CREATININE 2.38*  CALCIUM  9.5   Liver Function Tests: Recent Labs  Lab 12/11/23 1317  AST 64*  ALT 34  ALKPHOS 64  BILITOT 1.1  PROT 9.5*  ALBUMIN  4.8   Recent Labs  Lab 12/11/23 1317  LIPASE 34  No results for input(s): AMMONIA in the last 168 hours. CBC: Recent Labs  Lab 12/11/23 1317  WBC 5.6  HGB 13.4  HCT 40.1  MCV 87.2  PLT 179   Cardiac Enzymes: No results for input(s): CKTOTAL, CKMB, CKMBINDEX, TROPONINI in the last 168 hours. BNP (last 3 results) No results for input(s): BNP in the last 8760 hours.  ProBNP (last 3 results) No results for input(s): PROBNP in the last 8760 hours.  CBG: No results for input(s): GLUCAP in the last 168 hours.  Radiological Exams on Admission: No results found. Assessment/Plan DAIVON RAYOS is a 48 y.o. male with medical history significant for anxiety and depression, treated PE, well-controlled T2DM, HTN, esophagitis, substance use disorder, gastritis, and gout with tophi who presents to the ED for evaluation of dehydration and admitted for acute kidney injury.  # AKI # Chronic hyponatremia # Dehydration Patient with history of depression presenting with dehydration in the setting of decreased food and water intake over the last week.  Labs show significant rise in creatinine from normal  to 2.38 and no electrolyte abnormalities. Sodium slightly down to 127 from 130 3 months ago.  Patient dry on exam but hemodynamically stable. AKI likely prerenal in the setting of dehydration and decreased p.o. intake. Status post 2 L IV LR in the ED. -Start IV NS 125 cc/h -Avoid nephrotoxic's agents -Follow-up mag, Phos and UA -Trend renal function  # HTN BP elevated with SBP in the 140s to 170s. -Continue amlodipine  10 mg daily  # Gastritis # Esophagitis Reports intermittent nausea and vomiting but denies any abdominal pain. -Continue Protonix  40 mg daily  # Anxiety and depression Reports recent depressive episode causing him to stop drinking and eating.  Denies SI/HI. Not on any medications but currently doing CBT.  States he plans to change his therapist -Continue CBT in the outpatient  # Gout with tophi History of gout with multiple tophi in his fingers and left elbow. Followed up with rheumatology in November 2024 and was given prednisone  pack with some improvement in the tophi.  He has persistent discomfort in the fingers with restricted ROM of the fingers but no recent gout flares. He has significant nausea and vomiting from colchicine  and unable to take NSAIDs due to history of esophageal ulcers. -Follow-up with rheumatology in the outpatient  # T2DM, diet controlled Last A1c 5.5% 3 months ago -Follow-up repeat A1c   DVT prophylaxis: Lovenox      Code Status: Full Code  Consults called: None  Family Communication: No family at bedside  Severity of Illness: The appropriate patient status for this patient is OBSERVATION. Observation status is judged to be reasonable and necessary in order to provide the required intensity of service to ensure the patient's safety. The patient's presenting symptoms, physical exam findings, and initial radiographic and laboratory data in the context of their medical condition is felt to place them at decreased risk for further clinical  deterioration. Furthermore, it is anticipated that the patient will be medically stable for discharge from the hospital within 2 midnights of admission.   Level of care: Med-Surg   Lou Claretta HERO, MD 12/11/2023, 5:00 PM [facility] Pager: [phone] Isaiah 41:10   If 7PM-7AM, please contact night-coverage [email or web address] Password [email or web address]

## 2023-12-11 NOTE — ED Triage Notes (Signed)
 Patient said he feels dehydrated. Has been feeling off for a week. Vomited last Saturday and then again yesterday. Has not been drinking much water. Denies cramping anywhere.

## 2023-12-11 NOTE — ED Provider Notes (Signed)
 [place] EMERGENCY DEPARTMENT AT [facility] Provider Note   CSN: [id number] Arrival date & time: 12/11/23  1237     History  Chief Complaint  Patient presents with   Dehydration    Richard Lopez is a 48 y.o. male.  48 year old male with a history of gout, marijuana use, alcohol use, diabetes, and hypertension who presents emergency department due to concerns for dehydration.  Patient reports that since Saturday he has been feeling poorly.  Did throw up then and then threw up yesterday.  Says he has been nauseous in between.  No abdominal pain.  Smokes marijuana a few times a week but not daily.  Drinks alcohol approximately twice a week.  Says he has not peed in 24 hours and is starting to have muscle aches and so decided to come into the emergency department for evaluation.       Home Medications Prior to Admission medications   Medication Sig Start Date End Date Taking? Authorizing Provider  amLODipine  (NORVASC ) 10 MG tablet Take 1 tablet (10 mg total) by mouth daily. 08/27/23 12/11/23 Yes Rai, Ripudeep K, MD  feeding supplement (ENSURE ENLIVE / ENSURE PLUS) LIQD Take 237 mLs by mouth 3 (three) times daily between meals. Patient taking differently: Take 237 mLs by mouth See admin instructions. Drink 237 ml's by mouth one to two times a day between meals 08/20/23  Yes Tobie Yetta HERO, MD  Multiple Vitamin (MULTIVITAMIN WITH MINERALS) TABS tablet Take 1 tablet by mouth daily. 11/16/22  Yes Sheikh, Omair Latif, DO  pantoprazole  (PROTONIX ) 40 MG tablet Take 1 tablet (40 mg total) by mouth 2 (two) times daily before a meal. Patient taking differently: Take 40 mg by mouth See admin instructions. Take 40 mg by mouth one to two times a day 30 minutes prior to a meal 08/27/23  Yes Rai, Ripudeep K, MD  TYLENOL  8 HOUR ARTHRITIS PAIN 650 MG CR tablet Take 1,300 mg by mouth every 8 (eight) hours as needed (for arthritic pain).   Yes [provider]  acetaminophen  (TYLENOL )  500 MG tablet Take 1 tablet (500 mg total) by mouth daily as needed for mild pain (pain score 1-3), moderate pain (pain score 4-6), fever or headache. Patient not taking: Reported on 12/11/2023 08/20/23   Tobie Yetta HERO, MD  ondansetron  (ZOFRAN ) 4 MG tablet Take 1 tablet (4 mg total) by mouth every 6 (six) hours as needed for nausea or vomiting. Patient not taking: Reported on 12/11/2023 08/27/23 08/26/24  Rai, Nydia POUR, MD  oxyCODONE  (OXY IR/ROXICODONE ) 5 MG immediate release tablet Take 1 tablet (5 mg total) by mouth every 6 (six) hours as needed for severe pain (pain score 7-10). Patient not taking: Reported on 12/11/2023 08/27/23   Rai, Nydia POUR, MD  sucralfate  (CARAFATE ) 1 g tablet Take 1 tablet (1 g total) by mouth 4 (four) times daily -  with meals and at bedtime. Patient not taking: Reported on 12/11/2023 08/27/23 12/11/23  Davia Nydia POUR, MD      Allergies    Ativan  [lorazepam ] and Nsaids    Review of Systems   Review of Systems  Physical Exam Updated Vital Signs BP (!) 144/81   Pulse 69   Temp 97.9 F (36.6 C) (Oral)   Resp 16   Ht 6' 6 (1.981 m)   Wt 108.9 kg   SpO2 96%   BMI 27.73 kg/m  Physical Exam Vitals and nursing note reviewed.  Constitutional:      General:  He is not in acute distress.    Appearance: He is well-developed.  HENT:     Head: Normocephalic and atraumatic.     Right Ear: External ear normal.     Left Ear: External ear normal.     Nose: Nose normal.  Eyes:     Extraocular Movements: Extraocular movements intact.     Conjunctiva/sclera: Conjunctivae normal.     Pupils: Pupils are equal, round, and reactive to light.  Cardiovascular:     Rate and Rhythm: Normal rate and regular rhythm.     Heart sounds: Normal heart sounds.  Pulmonary:     Effort: Pulmonary effort is normal. No respiratory distress.  Abdominal:     General: There is no distension.     Palpations: Abdomen is soft. There is no mass.     Tenderness: There is no abdominal  tenderness. There is no guarding.  Musculoskeletal:     Cervical back: Normal range of motion and neck supple.     Right lower leg: No edema.     Left lower leg: No edema.  Skin:    General: Skin is warm and dry.  Neurological:     Mental Status: He is alert. Mental status is at baseline.  Psychiatric:        Mood and Affect: Mood normal.        Behavior: Behavior normal.     ED Results / Procedures / Treatments   Labs (all labs ordered are listed, but only abnormal results are displayed) Labs Reviewed  COMPREHENSIVE METABOLIC PANEL - Abnormal; Notable for the following components:      Result Value   Sodium 127 (*)    Chloride 83 (*)    Glucose, Bld 155 (*)    BUN 22 (*)    Creatinine, Ser 2.38 (*)    Total Protein 9.5 (*)    AST 64 (*)    GFR, Estimated 33 (*)    Anion gap 21 (*)    All other components within normal limits  URINALYSIS, ROUTINE W REFLEX MICROSCOPIC - Abnormal; Notable for the following components:   APPearance CLOUDY (*)    Hgb urine dipstick SMALL (*)    Ketones, ur 5 (*)    Protein, ur 100 (*)    Leukocytes,Ua TRACE (*)    Bacteria, UA RARE (*)    All other components within normal limits  MAGNESIUM  - Abnormal; Notable for the following components:   Magnesium  1.6 (*)    All other components within normal limits  HEMOGLOBIN A1C - Abnormal; Notable for the following components:   Hgb A1c MFr Bld 5.7 (*)    All other components within normal limits  BASIC METABOLIC PANEL - Abnormal; Notable for the following components:   Sodium 129 (*)    Potassium 2.8 (*)    Chloride 90 (*)    Glucose, Bld 109 (*)    Creatinine, Ser 1.38 (*)    Calcium  8.6 (*)    All other components within normal limits  CBC - Abnormal; Notable for the following components:   RBC 3.69 (*)    Hemoglobin 10.9 (*)    HCT 31.8 (*)    Platelets 146 (*)    All other components within normal limits  RESP PANEL BY RT-PCR (RSV, FLU A&B, COVID)  RVPGX2  LIPASE, BLOOD  CBC   PHOSPHORUS  HIV ANTIBODY (ROUTINE TESTING W REFLEX)  MAGNESIUM     EKG None  Radiology No results found.  Procedures Procedures  Medications Ordered in ED Medications  amLODipine  (NORVASC ) tablet 10 mg (10 mg Oral Given 12/12/23 0848)  pantoprazole  (PROTONIX ) EC tablet 40 mg (40 mg Oral Given 12/12/23 0853)  multivitamin with minerals tablet 1 tablet (1 tablet Oral Given 12/12/23 0853)  enoxaparin  (LOVENOX ) injection 40 mg (40 mg Subcutaneous Given 12/11/23 2206)  acetaminophen  (TYLENOL ) tablet 650 mg (has no administration in time range)    Or  acetaminophen  (TYLENOL ) suppository 650 mg (has no administration in time range)  senna-docusate (Senokot-S) tablet 1 tablet (has no administration in time range)  ondansetron  (ZOFRAN ) tablet 4 mg (has no administration in time range)    Or  ondansetron  (ZOFRAN ) injection 4 mg (has no administration in time range)  0.9 %  sodium chloride  infusion ( Intravenous New Bag/Given 12/12/23 0328)  potassium chloride  SA (KLOR-CON  M) CR tablet 40 mEq (40 mEq Oral Given 12/12/23 0853)  lactated ringers  bolus 2,000 mL (0 mLs Intravenous Stopped 12/11/23 2309)  ondansetron  (ZOFRAN ) injection 4 mg (4 mg Intravenous Given 12/11/23 1609)  magnesium  sulfate IVPB 2 g 50 mL (0 g Intravenous Stopped 12/12/23 0326)    ED Course/ Medical Decision Making/ A&P Clinical Course as of 12/12/23 0926  Fri Dec 11, 2023  1514 Sodium(!): 127 At baseline [RP]  1514 Creatinine(!): 2.38 Baseline of 0.9 [RP]  1601 Bladder scan with 50 ml of urine [RP]  1615 Dr Lou from hospitalist to admit [RP]    Clinical Course User Index [RP] Yolande Lamar BROCKS, MD                                 Medical Decision Making Amount and/or Complexity of Data Reviewed Labs: ordered. Decision-making details documented in ED Course.  Risk Prescription drug management. Decision regarding hospitalization.   Richard Lopez is a 48 y.o. male with comorbidities that complicate the patient  evaluation including gout, marijuana use, alcohol use, diabetes, and hypertension who presents emergency department due to concerns for dehydration.    Initial Ddx:  Electrolyte abnormality, AKI, gastroenteritis, cannabinoid hyperemesis  MDM/Course:  Patient presents emergency department with nausea and vomiting as well as concerns for dehydration.  Has had low urine output over the past 24 hours.  Bladder scan showed that he had 50 mL of urine in his bladder making urinary retention highly unlikely.  He had lab work that was drawn that showed that he is hyponatremic which is chronic for him.  Also showed that he had an AKI and was ordered for 2 L of fluids.  Given the degree of his AKI and the fact that he is still nauseous feel that he would benefit from admission to hospitalist for additional fluids and repeat lab work.  Upon re-evaluation remained stable.  Given some Zofran  for his nausea as well.  Did consider cannabinoid hyperemesis and alcohol use as the cause of his symptoms but based on his report does not appear to be using the substances enough to be causing his symptoms.  This patient presents to the ED for concern of complaints listed in HPI, this involves an extensive number of treatment options, and is a complaint that carries with it a high risk of complications and morbidity. Disposition including potential need for admission considered.   Dispo: Admit to Floor  Records reviewed Admission Notes The following labs were independently interpreted: Chemistry and show AKI I personally reviewed and interpreted cardiac monitoring: normal sinus rhythm  I personally reviewed  and interpreted the pt's EKG: see above for interpretation  I have reviewed the patients home medications and made adjustments as needed Consults: Hospitalist  Portions of this note were generated with Scientist, clinical (histocompatibility and immunogenetics). Dictation errors may occur despite best attempts at proofreading.      Final Clinical  Impression(s) / ED Diagnoses Final diagnoses:  Dehydration  AKI (acute kidney injury) (HCC)  Nausea and vomiting, unspecified vomiting type  Hypomagnesemia    Rx / DC Orders ED Discharge Orders     None         Yolande Lamar BROCKS, MD 12/12/23 [phone]

## 2023-12-11 NOTE — ED Notes (Signed)
Asked patient to provide urine sample. Patient said "I cant."

## 2023-12-12 DIAGNOSIS — N179 Acute kidney failure, unspecified: Secondary | ICD-10-CM | POA: Diagnosis not present

## 2023-12-12 LAB — BASIC METABOLIC PANEL
Anion gap: 14 (ref 5–15)
Anion gap: 13 (ref 5–15)
BUN: 20 mg/dL (ref 6–20)
BUN: 15 mg/dL (ref 6–20)
CO2: 25 mmol/L (ref 22–32)
CO2: 27 mmol/L (ref 22–32)
Calcium: 8.6 mg/dL — ABNORMAL LOW (ref 8.9–10.3)
Calcium: 9 mg/dL (ref 8.9–10.3)
Chloride: 90 mmol/L — ABNORMAL LOW (ref 98–111)
Chloride: 91 mmol/L — ABNORMAL LOW (ref 98–111)
Creatinine, Ser: 1.38 mg/dL — ABNORMAL HIGH (ref 0.61–1.24)
Creatinine, Ser: 0.92 mg/dL (ref 0.61–1.24)
GFR, Estimated: >60 mL/min (ref >60)
GFR, Estimated: >60 mL/min (ref >60)
Glucose, Bld: 109 mg/dL — ABNORMAL HIGH (ref 70–99)
Glucose, Bld: 156 mg/dL — ABNORMAL HIGH (ref 70–99)
Potassium: 2.8 mmol/L — ABNORMAL LOW (ref 3.5–5.1)
Potassium: 3.9 mmol/L (ref 3.5–5.1)
Sodium: 129 mmol/L — ABNORMAL LOW (ref 135–145)
Sodium: 131 mmol/L — ABNORMAL LOW (ref 135–145)

## 2023-12-12 LAB — HIV ANTIBODY (ROUTINE TESTING W REFLEX): HIV Screen 4th Generation wRfx: NONREACTIVE

## 2023-12-12 LAB — CBC
HCT: 31.8 % — ABNORMAL LOW (ref 39.0–52.0)
Hemoglobin: 10.9 g/dL — ABNORMAL LOW (ref 13.0–17.0)
MCH: 29.5 pg (ref 26.0–34.0)
MCHC: 34.3 g/dL (ref 30.0–36.0)
MCV: 86.2 fL (ref 80.0–100.0)
Platelets: 146 10*3/uL — ABNORMAL LOW (ref 150–400)
RBC: 3.69 MIL/uL — ABNORMAL LOW (ref 4.22–5.81)
RDW: 13.6 % (ref 11.5–15.5)
WBC: 4.4 10*3/uL (ref 4.0–10.5)
nRBC: 0 % (ref 0.0–0.2)

## 2023-12-12 LAB — MAGNESIUM: Magnesium: 2.1 mg/dL (ref 1.7–2.4)

## 2023-12-12 MED ORDER — SERTRALINE HCL 50 MG PO TABS
50.0000 mg | ORAL_TABLET | Freq: Every day | ORAL | Status: DC
  Administered 2023-12-13: 50 mg via ORAL
  Filled 2023-12-12 (×2): qty 1

## 2023-12-12 MED ORDER — POTASSIUM CHLORIDE CRYS ER 20 MEQ PO TBCR
40.0000 meq | EXTENDED_RELEASE_TABLET | ORAL | Status: AC
  Administered 2023-12-12 (×2): 40 meq via ORAL
  Filled 2023-12-12 (×2): qty 2

## 2023-12-12 MED ORDER — MIRTAZAPINE 15 MG PO TABS
7.5000 mg | ORAL_TABLET | Freq: Every day | ORAL | Status: DC
  Administered 2023-12-12: 7.5 mg via ORAL
  Filled 2023-12-12: qty 1

## 2023-12-12 MED ORDER — INFLUENZA VIRUS VACC SPLIT PF (FLUZONE) 0.5 ML IM SUSY
0.5000 mL | PREFILLED_SYRINGE | INTRAMUSCULAR | Status: DC
  Filled 2023-12-12: qty 0.5

## 2023-12-12 MED ORDER — PRAZOSIN HCL 1 MG PO CAPS
1.0000 mg | ORAL_CAPSULE | Freq: Every day | ORAL | Status: DC
  Administered 2023-12-12: 1 mg via ORAL
  Filled 2023-12-12: qty 1

## 2023-12-12 NOTE — Consult Note (Signed)
 [facility] Psychiatric Consult Initial  Patient Name: .Richard Lopez  MRN: [id number]  DOB: 05/16/1976  Consult Order details:  Orders (From admission, onward)     Start     Ordered   12/12/23 1021  IP CONSULT TO PSYCHIATRY       Comments: Patient admitted for not eating for several days and AKI, says he is too anxious to eat.  Not suicidal.  Not on any meds.  Does not have a therapist.  Are you able to see and perhaps start meds or come up with outpatient follow-up?  Would like to discharge today if you can make recommendations.  Ordering Provider: Dana Ivan Standing, MD  Provider:  (Not yet assigned)  Question Answer Comment  Location [facility]   Reason for Consult? Depression      12/12/23 1021             Mode of Visit: In person    Psychiatry Consult Evaluation  Service Date: December 12, 2023 LOS:  LOS: 0 days  Chief Complaint I feel depressed and overwhelmed as a result of my chronic medical problem.   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  Adjustment disorder with depressed mood.   Assessment  Richard Lopez is a 48 y.o. male admitted: Medicallyfor 12/11/2023 12:42 PM for evaluation of dehydration. He carries the psychiatric diagnoses of depression, anxiety and Cannabis use disorder, has a past medical history of treated PE, well-controlled T2DM, HTN, esophagitis, gastritis, Rheumatoid Arthritis, and gout with tophi   His current presentation of depressed mood, lack of motivation, low energy level, fatigue, excessive tiredness, problem sleeping , and feeling overwhelmed in the context of chronic medical issues (Rheumatoid Arthritis) is most consistent with major depressive disorder due to medical condition. A diagnosis of adjustment disorder with depressed mood can also be entertained. He does not meet criteria for inpatient admission based on lack of active suicidal or homicidal ideation,  intent or plan. He is able to contract for safety. Patient is not currently on any psychotropic medications, but was prescribed Ativan  in the past for anxiety for which he developed side effects (hallucinations). On initial examination, patient was awake, alert, and oriented x 4. He was calm and cooperative with evaluation. Mood was depressed with constricted affect. Please see plan below for detailed recommendations.   Diagnoses:  Active Hospital problems: Active Problems:   AKI (acute kidney injury) (HCC)    Plan   ## Psychiatric Medication Recommendations:  Start Zoloft  50 mg daily for depression/anxiety, and up titrate as needed Start Prazosin  1 mg at bedtime for nightmares Consider Remeron  7.5 mg at bedtime for sleep/appetite stimulation.  Please correct electrolyte abnormalities as per primary team-Please note that electrolyte abnormalities could explain excessive fatigue and depression the patient is experiencing.  Consider TOC/Social worker consult to facilitate follow up with outpatient psychiatrist/therapy upon hospital discharge if interested.  Note: Please avoid Benzodiazepine in patients with PTSD to prevent disinhibition and worsening of symptoms.   ## Medical Decision Making Capacity: Not specifically addressed in this encounter  ## Further Work-up:  -- TSH, T4 level, B12 AND folate --Please obtain EKG level (potassium of-2.8).  -- most recent EKG on 08/17/23 had QtC of 440 -- Pertinent labwork reviewed earlier this admission includes: Na-129, K-2.8, Cl-90, Cr-1.38, Ca-8.6, Hb-10.9   ## Disposition:-- There are no psychiatric contraindications to discharge at this time  ## Behavioral / Environmental: - No specific recommendations at this time.     ##  Safety and Observation Level:  - Based on my clinical evaluation, I estimate the patient to be at low risk of self harm in the current setting. - At this time, we recommend  routine. This decision is based on my review  of the chart including patient's history and current presentation, interview of the patient, mental status examination, and consideration of suicide risk including evaluating suicidal ideation, plan, intent, suicidal or self-harm behaviors, risk factors, and protective factors. This judgment is based on our ability to directly address suicide risk, implement suicide prevention strategies, and develop a safety plan while the patient is in the clinical setting. Please contact our team if there is a concern that risk level has changed.  CSSR Risk Category:C-SSRS RISK CATEGORY: No Risk  Suicide Risk Assessment: Patient has following modifiable risk factors for suicide: untreated depression and lack of access to outpatient mental health resources, which we are addressing by prescribing medications and referral to follow up with outpatient psychiatrist. Patient has following non-modifiable or demographic risk factors for suicide: male gender Patient has the following protective factors against suicide: Frustration tolerance and no history of suicide attempts  Thank you for this consult request. Recommendations have been communicated to the primary team.  We will sign off at this time. Please re-consult as needed.   Jan DELENA Donath, MD       History of Present Illness  Relevant Aspects of Hospital ED Course:  Admitted on 12/11/2023 for evaluation of dehydration.   HPI: Richard Lopez is a 48 y.o. male with medical history significant for anxiety and depression, treated PE, well-controlled T2DM, HTN, esophagitis, substance use disorder, gastritis, and gout with tophi who presents to the ED for evaluation of dehydration.  He reports that over the last week, he has been very depressed and has not been eating or drinking much over the last week.  He reports vomiting last Saturday then again yesterday with mostly stomach acid.  He also reports having muscle cramps that started last night.  He endorsed feeling  weak but denies any dizziness, chest pain, shortness of breath, abdominal pain, diarrhea or constipation. He is currently being treated for dehydration with electrolyte abnormalities.     Patient Report:  Patient seen face to face in his hospital room. He is awake, alert and oriented x 4. He is calm and cooperative with evaluation. Patient reports ongoing depressed mood, lack of motivation, low energy level, fatigue, excessive tiredness and problem sleeping because he is overwhelmed as a result of his chronic medical issues (Rheumatoid Arthritis). 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 However, patient denies auditory or visual hallucinations, suicidal or homicidal ideation, intent or plan. He denies any prior suicide attempts.   Patient indicates he has never seen a psychiatrist in his life but was prescribed Ativan  in the past which was causing hallucination. Patient reports prior use of cannabis and alcohol but stopped few months ago. He is interested in medication for depression/anxiety and nightmares. He currently sees a therapists but states its not helping.   Psych ROS:  Depression: He reports depressed mood, lack of motivation, low energy level, fatigue, excessive tiredness and problem sleeping because he is overwhelmed as a result of his chronic medical issues Anxiety:H reports intrusive thoughts, nightmares, worry and being apprehensive.  Mania (lifetime and current): denies  Psychosis: (  lifetime and current): denies AH/VH but reports feeling paranoid which may be related to PTSD.   Collateral information:  Patient did not give any collateral.   ROS   Psychiatric and Social History  Psychiatric History:  Information collected from patient   Prev Dx/Sx: denies formal diagnosis  Current Psych  Provider: none  Home Meds (current): none  Previous Med Trials: Ativan  but developed side effects.  Therapy: yes, currently seeing a therapist but does not benefit from it.   Prior Psych Hospitalization:denies   Prior Self Harm: denies  Prior Violence: denies   Family Psych History: denies  Family Hx suicide: denies   Social History:  Educational Hx: denies  Occupational Hx: unemployed  [employer] Hx: denies  Living Situation: lives alone  Spiritual Hx: denies  Access to weapons/lethal means: denies    Substance History Alcohol: Used to drink alcohol in the past  Type of alcohol: beer Last Drink more than 6 months ago Number of drinks per day 1-2 beers  History of alcohol withdrawal seizures denies  History of DT's denies  Tobacco: denies  Illicit drugs: THC Prescription drug abuse: denies  Rehab hx: denies   Exam Findings  Physical Exam:  General: Pleasant, well-appearing middle-age man laying in bed. No acute distress. HEENT: /AT. Anicteric sclera.  Dry mucous membrane. CV: Tachycardic.  Regular rhythm. No murmurs, rubs, or gallops. No LE edema Pulmonary: Lungs CTAB. Normal effort. No wheezing or rales. Abdominal: Soft, nontender, nondistended. Normal bowel sounds. Extremities: Multiple tophi on the finger joints with decreased ROM of the fingers. Tophi of the left elbow. Skin: Warm and dry. No obvious rash or lesions. Decreased skin turgor. Neuro: A&Ox3. Moves all extremities. Normal sensation to light touch. No focal deficit.   Vital Signs:  Temp:  [97.9 F (36.6 C)-98.1 F (36.7 C)] 98 F (36.7 C) (02/08 1016) Pulse Rate:  [65-87] 74 (02/08 1016) Resp:  [16-18] 18 (02/08 1016) BP: (134-174)/(74-107) 135/83 (02/08 1016) SpO2:  [94 %-100 %] 98 % (02/08 1016) Blood pressure 135/83, pulse 74, temperature 98 F (36.7 C), temperature source Oral, resp. rate 18, height 6' 6 (1.981 m), weight 108.9 kg, SpO2 98%. Body mass index is 27.73 kg/m.  Physical  Exam  Mental Status Exam: General Appearance: Casual  Orientation:  Full (Time, Place, and Person)  Memory:  Immediate;   Good  Concentration:  Concentration: Good  Recall:  Good  Attention  Fair  Eye Contact:  Minimal  Speech:  Slow  Language:  Good  Volume:  Decreased  Mood: I feel depressed  Affect:  Constricted  Thought Process:  Linear  Thought Content:  Paranoid Ideation  Suicidal Thoughts:  No  Homicidal Thoughts:  No  Judgement:  Fair  Insight:  Fair  Psychomotor Activity:  Decreased  Akathisia:  No  Fund of Knowledge:  Fair      Assets:  Communication Skills  Cognition:  WNL  ADL's:  Impaired  AIMS (if indicated):        Other History   These have been pulled in through the EMR, reviewed, and updated if appropriate.  Family History:  The patient's family history includes CAD in an other family member; Diabetes Mellitus II in his father and another family member.  Medical History: Past Medical History:  Diagnosis Date  . Anxiety   . 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 Depression   . Diabetes mellitus    diet controlled  . Essential hypertension   . Gastritis   .  Gout   . Normocytic anemia due to blood loss 08/21/2019  . Pulmonary embolism [facility])     Surgical History: Past Surgical History:  Procedure Laterality Date  . BIOPSY  11/12/2022   Procedure: BIOPSY;  Surgeon: Saintclair Jasper, MD;  Location: [facility];  Service: Gastroenterology;;  . ESOPHAGOGASTRODUODENOSCOPY (EGD) WITH PROPOFOL  N/A 08/14/2019   Procedure: ESOPHAGOGASTRODUODENOSCOPY (EGD) WITH PROPOFOL ;  Surgeon: Dianna Specking, MD;  Location: [facility];  Service: Endoscopy;  Laterality: N/A;  . ESOPHAGOGASTRODUODENOSCOPY (EGD) WITH PROPOFOL  N/A 11/12/2022   Procedure: ESOPHAGOGASTRODUODENOSCOPY (EGD) WITH PROPOFOL ;  Surgeon: Saintclair Jasper, MD;  Location: [facility];  Service: Gastroenterology;  Laterality: N/A;  . EXTERNAL FIXATION LEG Left 11/07/2018   Procedure: EXTERNAL  FIXATION LEFT LOWER LEG;  Surgeon: Fidel Rogue, MD;  Location: [facility];  Service: Orthopedics;  Laterality: Left;  . EXTERNAL FIXATION REMOVAL Left 11/08/2018   Procedure: REMOVAL EXTERNAL FIXATION LEG;  Surgeon: Kendal Franky SQUIBB, MD;  Location: [facility] OR;  Service: Orthopedics;  Laterality: Left;  . INTRAMEDULLARY (IM) NAIL INTERTROCHANTERIC Right 03/26/2019   Procedure: INTRAMEDULLARY (IM) NAIL INTERTROCHANTRIC;  Surgeon: Kendal Franky SQUIBB, MD;  Location: [facility] OR;  Service: Orthopedics;  Laterality: Right;  . INTRAMEDULLARY (IM) NAIL INTERTROCHANTERIC Right 05/17/2019   Procedure: Intramedullary (Im) Nail Intertroch with circlage wiring;  Surgeon: Celena Sharper, MD;  Location: [facility] OR;  Service: Orthopedics;  Laterality: Right;  . NO PAST SURGERIES    . OPEN REDUCTION INTERNAL FIXATION (ORIF) TIBIA/FIBULA FRACTURE Left 11/08/2018   Procedure: OPEN REDUCTION INTERNAL FIXATION (ORIF) TIBIA/FIBULA FRACTURE;  Surgeon: Kendal Franky SQUIBB, MD;  Location: [facility];  Service: Orthopedics;  Laterality: Left;  . ORIF FEMUR FRACTURE Right 05/17/2019   Procedure: REMOVAL  OF HARDWARE;  Surgeon: Celena Sharper, MD;  Location: [facility] OR;  Service: Orthopedics;  Laterality: Right;     Medications:   Current Facility-Administered Medications:  .  0.9 %  sodium chloride  infusion, , Intravenous, Continuous, Amponsah, Claretta HERO, MD, Last Rate: 125 mL/hr at 12/12/23 1327, New Bag at 12/12/23 1327 .  acetaminophen  (TYLENOL ) tablet 650 mg, 650 mg, Oral, Q6H PRN **OR** acetaminophen  (TYLENOL ) suppository 650 mg, 650 mg, Rectal, Q6H PRN, Lou Claretta HERO, MD .  amLODipine  (NORVASC ) tablet 10 mg, 10 mg, Oral, Daily, Lou Claretta HERO, MD, 10 mg at 12/12/23 0848 .  enoxaparin  (LOVENOX ) injection 40 mg, 40 mg, Subcutaneous, Q24H, Lou Claretta HERO, MD, 40 mg at 12/11/23 2206 .  multivitamin with minerals tablet 1 tablet, 1 tablet, Oral, Daily, Lou Claretta HERO, MD, 1 tablet at 12/12/23 0853 .  ondansetron  (ZOFRAN ) tablet 4 mg, 4  mg, Oral, Q6H PRN **OR** ondansetron  (ZOFRAN ) injection 4 mg, 4 mg, Intravenous, Q6H PRN, Lou Claretta HERO, MD .  pantoprazole  (PROTONIX ) EC tablet 40 mg, 40 mg, Oral, BID AC, Amponsah, Prosper M, MD, 40 mg at 12/12/23 0853 .  senna-docusate (Senokot-S) tablet 1 tablet, 1 tablet, Oral, QHS PRN, Lou Claretta HERO, MD  Current Outpatient Medications:  .  amLODipine  (NORVASC ) 10 MG tablet, Take 1 tablet (10 mg total) by mouth daily., Disp: 30 tablet, Rfl: 1 .  feeding supplement (ENSURE ENLIVE / ENSURE PLUS) LIQD, Take 237 mLs by mouth 3 (three) times daily between meals. (Patient taking differently: Take 237 mLs by mouth See admin instructions. Drink 237 ml's by mouth one to two times a day between meals), Disp: 10000 mL, Rfl: 0 .  Multiple Vitamin (MULTIVITAMIN WITH MINERALS) TABS tablet, Take 1 tablet by mouth daily., Disp: 30 tablet, Rfl: 0 .  pantoprazole  (PROTONIX )  40 MG tablet, Take 1 tablet (40 mg total) by mouth 2 (two) times daily before a meal. (Patient taking differently: Take 40 mg by mouth See admin instructions. Take 40 mg by mouth one to two times a day 30 minutes prior to a meal), Disp: 60 tablet, Rfl: 3 .  TYLENOL  8 HOUR ARTHRITIS PAIN 650 MG CR tablet, Take 1,300 mg by mouth every 8 (eight) hours as needed (for arthritic pain)., Disp: , Rfl:  .  acetaminophen  (TYLENOL ) 500 MG tablet, Take 1 tablet (500 mg total) by mouth daily as needed for mild pain (pain score 1-3), moderate pain (pain score 4-6), fever or headache. (Patient not taking: Reported on 12/11/2023), Disp: , Rfl:  .  ondansetron  (ZOFRAN ) 4 MG tablet, Take 1 tablet (4 mg total) by mouth every 6 (six) hours as needed for nausea or vomiting. (Patient not taking: Reported on 12/11/2023), Disp: 30 tablet, Rfl: 1 .  oxyCODONE  (OXY IR/ROXICODONE ) 5 MG immediate release tablet, Take 1 tablet (5 mg total) by mouth every 6 (six) hours as needed for severe pain (pain score 7-10). (Patient not taking: Reported on 12/11/2023), Disp: 28  tablet, Rfl: 0 .  sucralfate  (CARAFATE ) 1 g tablet, Take 1 tablet (1 g total) by mouth 4 (four) times daily -  with meals and at bedtime. (Patient not taking: Reported on 12/11/2023), Disp: 120 tablet, Rfl: 0  Allergies: Allergies  Allergen Reactions  . Ativan  [Lorazepam ] Anxiety and Other (See Comments)    Hallucinations  . Nsaids Other (See Comments)    Hx of esophageal ulcer    Jesaiah Fabiano DELENA Donath, MD

## 2023-12-12 NOTE — ED Notes (Signed)
 ED TO INPATIENT HANDOFF REPORT  ED Nurse Name and Phone #: Mayre Bury RN   S Name/Age/Gender Richard Lopez 48 y.o. male Room/Bed: WA01/WA01  Code Status   Code Status: Full Code  Home/SNF/Other Home Patient oriented to: self, place, time, and situation Is this baseline? Yes   Triage Complete: Triage complete  Chief Complaint AKI (acute kidney injury) [facility]) [N17.9]  Triage Note Patient said he feels dehydrated. Has been feeling off for a week. Vomited last Saturday and then again yesterday. Has not been drinking much water. Denies cramping anywhere.    Allergies Allergies  Allergen Reactions   Ativan  [Lorazepam ] Anxiety and Other (See Comments)    Hallucinations   Nsaids Other (See Comments)    Hx of esophageal ulcer    Level of Care/Admitting Diagnosis ED Disposition     ED Disposition  Admit   Condition  --   Comment  Hospital [facility]: [facility] [[id number]]  Level of Care: Med-Surg [16]  May place patient in observation at [facility] or [facility] if equivalent level of care is available:: No  Covid Evaluation: Asymptomatic - no recent exposure (last 10 days) testing not required  Diagnosis: AKI (acute kidney injury) [facility]) [[id number]]  Admitting Physician: LOU CLARETTA HERO [[id number]]  Attending Physician: LOU CLARETTA HERO [[id number]]          B Medical/Surgery History Past Medical History:  Diagnosis Date   Anxiety    Arthritis    hands and possibly knee   Depression    Diabetes mellitus    diet controlled   Essential hypertension    Gastritis    Gout    Normocytic anemia due to blood loss 08/21/2019   Pulmonary embolism [facility])    Past Surgical History:  Procedure Laterality Date   BIOPSY  11/12/2022   Procedure: BIOPSY;  Surgeon: Saintclair Jasper, MD;  Location: [facility] ENDOSCOPY;  Service: Gastroenterology;;   ESOPHAGOGASTRODUODENOSCOPY (EGD) WITH PROPOFOL  N/A 08/14/2019   Procedure: ESOPHAGOGASTRODUODENOSCOPY (EGD) WITH PROPOFOL ;   Surgeon: Dianna Specking, MD;  Location: [facility];  Service: Endoscopy;  Laterality: N/A;   ESOPHAGOGASTRODUODENOSCOPY (EGD) WITH PROPOFOL  N/A 11/12/2022   Procedure: ESOPHAGOGASTRODUODENOSCOPY (EGD) WITH PROPOFOL ;  Surgeon: Saintclair Jasper, MD;  Location: [facility];  Service: Gastroenterology;  Laterality: N/A;   EXTERNAL FIXATION LEG Left 11/07/2018   Procedure: EXTERNAL FIXATION LEFT LOWER LEG;  Surgeon: Fidel Rogue, MD;  Location: [facility] ORS;  Service: Orthopedics;  Laterality: Left;   EXTERNAL FIXATION REMOVAL Left 11/08/2018   Procedure: REMOVAL EXTERNAL FIXATION LEG;  Surgeon: Kendal Franky SQUIBB, MD;  Location: [facility] OR;  Service: Orthopedics;  Laterality: Left;   INTRAMEDULLARY (IM) NAIL INTERTROCHANTERIC Right 03/26/2019   Procedure: INTRAMEDULLARY (IM) NAIL INTERTROCHANTRIC;  Surgeon: Kendal Franky SQUIBB, MD;  Location: [facility] OR;  Service: Orthopedics;  Laterality: Right;   INTRAMEDULLARY (IM) NAIL INTERTROCHANTERIC Right 05/17/2019   Procedure: Intramedullary (Im) Nail Intertroch with circlage wiring;  Surgeon: Celena Sharper, MD;  Location: [facility] OR;  Service: Orthopedics;  Laterality: Right;   NO PAST SURGERIES     OPEN REDUCTION INTERNAL FIXATION (ORIF) TIBIA/FIBULA FRACTURE Left 11/08/2018   Procedure: OPEN REDUCTION INTERNAL FIXATION (ORIF) TIBIA/FIBULA FRACTURE;  Surgeon: Kendal Franky SQUIBB, MD;  Location: [facility] OR;  Service: Orthopedics;  Laterality: Left;   ORIF FEMUR FRACTURE Right 05/17/2019   Procedure: REMOVAL  OF HARDWARE;  Surgeon: Celena Sharper, MD;  Location: [facility] OR;  Service: Orthopedics;  Laterality: Right;     A IV Location/Drains/Wounds Patient Lines/Drains/Airways Status  Active Line/Drains/Airways     Name Placement date Placement time Site Days   Peripheral IV 12/11/23 20 G 1 Posterior;Right Forearm 12/11/23  1315  Forearm  1   Wound / Incision (Open or Dehisced) 08/19/23 Other (Comment) Elbow Left;Posterior Abscess-fluid filed with pus induration 08/19/23  1400  Elbow  115             Intake/Output Last 24 hours  Intake/Output Summary (Last 24 hours) at 12/12/2023 1633 Last data filed at 12/12/2023 9673 Gross per 24 hour  Intake 2050 ml  Output --  Net 2050 ml    Labs/Imaging Results for orders placed or performed during the hospital encounter of 12/11/23 (from the past 48 hours)  Lipase, blood     Status: None   Collection Time: 12/11/23  1:17 PM  Result Value Ref Range   Lipase 34 11 - 51 U/L    Comment: Performed at [facility], [address], [place], [place] [address]  Comprehensive metabolic panel     Status: Abnormal   Collection Time: 12/11/23  1:17 PM  Result Value Ref Range   Sodium 127 (L) 135 - 145 mmol/L    Comment: ELECTROLYTES REPEATED TO VERIFY   Potassium 4.3 3.5 - 5.1 mmol/L   Chloride 83 (L) 98 - 111 mmol/L    Comment: ELECTROLYTES REPEATED TO VERIFY   CO2 23 22 - 32 mmol/L    Comment: ELECTROLYTES REPEATED TO VERIFY   Glucose, Bld 155 (H) 70 - 99 mg/dL    Comment: Glucose reference range applies only to samples taken after fasting for at least 8 hours.   BUN 22 (H) 6 - 20 mg/dL   Creatinine, Ser 7.61 (H) 0.61 - 1.24 mg/dL   Calcium  9.5 8.9 - 10.3 mg/dL   Total Protein 9.5 (H) 6.5 - 8.1 g/dL   Albumin  4.8 3.5 - 5.0 g/dL   AST 64 (H) 15 - 41 U/L   ALT 34 0 - 44 U/L   Alkaline Phosphatase 64 38 - 126 U/L   Total Bilirubin 1.1 0.0 - 1.2 mg/dL   GFR, Estimated 33 (L) >60 mL/min    Comment: (NOTE) Calculated using the CKD-EPI Creatinine Equation (2021)    Anion gap 21 (H) 5 - 15    Comment: ELECTROLYTES REPEATED TO VERIFY Performed at [facility], [address] [place], [place] [address]   CBC     Status: None   Collection Time: 12/11/23  1:17 PM  Result Value Ref Range   WBC 5.6 4.0 - 10.5 K/uL    Comment: WHITE COUNT CONFIRMED ON SMEAR   RBC 4.60 4.22 - 5.81 MIL/uL   Hemoglobin 13.4 13.0 - 17.0 g/dL   HCT 59.8 60.9 - 47.9 %   MCV 87.2 80.0 - 100.0 fL   MCH 29.1 26.0 - 34.0  pg   MCHC 33.4 30.0 - 36.0 g/dL   RDW 85.8 88.4 - 84.4 %   Platelets 179 150 - 400 K/uL   nRBC 0.0 0.0 - 0.2 %    Comment: Performed at [facility], [address] [place], [place] [address]  Magnesium      Status: Abnormal   Collection Time: 12/11/23  1:17 PM  Result Value Ref Range   Magnesium  1.6 (L) 1.7 - 2.4 mg/dL    Comment: Performed at [facility], [address] [place], [place] [address]  Phosphorus     Status: None   Collection Time: 12/11/23  1:17 PM  Result Value Ref Range   Phosphorus 4.6 2.5 - 4.6 mg/dL    Comment: Performed at [facility], [address] [place], [place] [address]  Hemoglobin A1c     Status: Abnormal   Collection Time: 12/11/23  1:17 PM  Result Value Ref Range   Hgb A1c MFr Bld 5.7 (H) 4.8 - 5.6 %    Comment: (NOTE) Pre diabetes:          5.7%-6.4%  Diabetes:              >6.4%  Glycemic control for   <7.0% adults with diabetes    Mean Plasma Glucose 116.89 mg/dL    Comment: Performed at [facility], [address], [place], [place] [address]  Resp panel by RT-PCR (RSV, Flu A&B, Covid) Anterior Nasal Swab     Status: None   Collection Time: 12/11/23  4:12 PM   Specimen: Anterior Nasal Swab  Result Value Ref Range   SARS Coronavirus 2 by RT PCR NEGATIVE NEGATIVE    Comment: (NOTE) SARS-CoV-2 target nucleic acids are NOT DETECTED.  The SARS-CoV-2 RNA is generally detectable in upper respiratory specimens during the acute phase of infection. The lowest concentration of SARS-CoV-2 viral copies this assay can detect is 138 copies/mL. A negative result does not preclude SARS-Cov-2 infection and should not be used as the sole basis for treatment or other patient management decisions. A negative result may occur with  improper specimen collection/handling, submission of specimen other than nasopharyngeal swab, presence of viral mutation(s) within the areas targeted by this  assay, and inadequate number of viral copies(<138 copies/mL). A negative result must be combined with clinical observations, patient history, and epidemiological information. The expected result is Negative.  Fact Sheet for Patients:  bloggercourse.com  Fact Sheet for Healthcare Providers:  seriousbroker.it  This test is no t yet approved or cleared by the [place]  FDA and  has been authorized for detection and/or diagnosis of SARS-CoV-2 by FDA under an Emergency Use Authorization (EUA). This EUA will remain  in effect (meaning this test can be used) for the duration of the COVID-19 declaration under Section 564(b)(1) of the Act, 21 U.S.C.section 360bbb-3(b)(1), unless the authorization is terminated  or revoked sooner.       Influenza A by PCR NEGATIVE NEGATIVE   Influenza B by PCR NEGATIVE NEGATIVE    Comment: (NOTE) The Xpert Xpress SARS-CoV-2/FLU/RSV plus assay is intended as an aid in the diagnosis of influenza from Nasopharyngeal swab specimens and should not be used as a sole basis for treatment. Nasal washings and aspirates are unacceptable for Xpert Xpress SARS-CoV-2/FLU/RSV testing.  Fact Sheet for Patients: bloggercourse.com  Fact Sheet for Healthcare Providers: seriousbroker.it  This test is not yet approved or cleared by the [place]  FDA and has been authorized for detection and/or diagnosis of SARS-CoV-2 by FDA under an Emergency Use Authorization (EUA). This EUA will remain in effect (meaning this test can be used) for the duration of the COVID-19 declaration under Section 564(b)(1) of the Act, 21 U.S.C. section 360bbb-3(b)(1), unless the authorization is terminated or revoked.     Resp Syncytial Virus by PCR NEGATIVE NEGATIVE    Comment: (NOTE) Fact Sheet for Patients: bloggercourse.com  Fact Sheet for Healthcare  Providers: seriousbroker.it  This test is not yet approved or cleared by the [place]  FDA and has been authorized for detection and/or diagnosis of SARS-CoV-2 by FDA under an Emergency Use Authorization (EUA). This  EUA will remain in effect (meaning this test can be used) for the duration of the COVID-19 declaration under Section 564(b)(1) of the Act, 21 U.S.C. section 360bbb-3(b)(1), unless the authorization is terminated or revoked.  Performed at [facility], [address] [place], [place] [address]   HIV Antibody (routine testing w rflx)     Status: None   Collection Time: 12/11/23  5:55 PM  Result Value Ref Range   HIV Screen 4th Generation wRfx Non Reactive Non Reactive    Comment: Performed at [facility], [address] [place], [place] [address]  Urinalysis, Routine w reflex microscopic -Urine, Clean Catch     Status: Abnormal   Collection Time: 12/11/23  7:50 PM  Result Value Ref Range   Color, Urine YELLOW YELLOW   APPearance CLOUDY (A) CLEAR   Specific Gravity, Urine 1.016 1.005 - 1.030   pH 5.0 5.0 - 8.0   Glucose, UA NEGATIVE NEGATIVE mg/dL   Hgb urine dipstick SMALL (A) NEGATIVE   Bilirubin Urine NEGATIVE NEGATIVE   Ketones, ur 5 (A) NEGATIVE mg/dL   Protein, ur 899 (A) NEGATIVE mg/dL   Nitrite NEGATIVE NEGATIVE   Leukocytes,Ua TRACE (A) NEGATIVE   RBC / HPF 6-10 0 - 5 RBC/hpf   WBC, UA 6-10 0 - 5 WBC/hpf   Bacteria, UA RARE (A) NONE SEEN   Squamous Epithelial / HPF 0-5 0 - 5 /HPF   Mucus PRESENT    Hyaline Casts, UA PRESENT     Comment: Performed at [facility], [address], [place], [place] [address]  Basic metabolic panel     Status: Abnormal   Collection Time: 12/12/23  4:40 AM  Result Value Ref Range   Sodium 129 (L) 135 - 145 mmol/L   Potassium 2.8 (L) 3.5 - 5.1 mmol/L    Comment: DELTA CHECK NOTED   Chloride 90 (L) 98 - 111 mmol/L   CO2 25 22 - 32 mmol/L   Glucose,  Bld 109 (H) 70 - 99 mg/dL    Comment: Glucose reference range applies only to samples taken after fasting for at least 8 hours.   BUN 20 6 - 20 mg/dL   Creatinine, Ser 8.61 (H) 0.61 - 1.24 mg/dL    Comment: DELTA CHECK NOTED   Calcium  8.6 (L) 8.9 - 10.3 mg/dL   GFR, Estimated >39 >39 mL/min    Comment: (NOTE) Calculated using the CKD-EPI Creatinine Equation (2021)    Anion gap 14 5 - 15    Comment: Performed at [facility], [address] [place], [place] [address]  Magnesium      Status: None   Collection Time: 12/12/23  4:40 AM  Result Value Ref Range   Magnesium  2.1 1.7 - 2.4 mg/dL    Comment: Performed at [facility], [address], [place], [place] [address]  CBC     Status: Abnormal   Collection Time: 12/12/23  4:40 AM  Result Value Ref Range   WBC 4.4 4.0 - 10.5 K/uL   RBC 3.69 (L) 4.22 - 5.81 MIL/uL   Hemoglobin 10.9 (L) 13.0 - 17.0 g/dL   HCT 68.1 (L) 60.9 - 47.9 %   MCV 86.2 80.0 - 100.0 fL   MCH 29.5 26.0 - 34.0 pg   MCHC 34.3 30.0 - 36.0 g/dL   RDW 86.3 88.4 - 84.4 %   Platelets 146 (L) 150 - 400 K/uL   nRBC 0.0 0.0 - 0.2 %  Comment: Performed at [facility], [address] [place], [place] [address]   No results found.  Pending Labs Unresulted Labs (From admission, onward)     Start     Ordered   12/12/23 1530  Basic metabolic panel  Once,   R        12/12/23 1023            Vitals/Pain Today's Vitals   12/12/23 0848 12/12/23 1016 12/12/23 1500 12/12/23 1600  BP: (!) 144/81 135/83 (!) 143/79 (!) 149/83  Pulse:  74 77 65  Resp:  18 17   Temp:  98 F (36.7 C)    TempSrc:  Oral    SpO2:  98% 100% 100%  Weight:      Height:      PainSc:        Isolation Precautions No active isolations  Medications Medications  amLODipine  (NORVASC ) tablet 10 mg (10 mg Oral Given 12/12/23 0848)  pantoprazole  (PROTONIX ) EC tablet 40 mg (40 mg Oral Given 12/12/23 0853)  multivitamin with minerals  tablet 1 tablet (1 tablet Oral Given 12/12/23 0853)  enoxaparin  (LOVENOX ) injection 40 mg (40 mg Subcutaneous Given 12/11/23 2206)  acetaminophen  (TYLENOL ) tablet 650 mg (has no administration in time range)    Or  acetaminophen  (TYLENOL ) suppository 650 mg (has no administration in time range)  senna-docusate (Senokot-S) tablet 1 tablet (has no administration in time range)  ondansetron  (ZOFRAN ) tablet 4 mg (has no administration in time range)    Or  ondansetron  (ZOFRAN ) injection 4 mg (has no administration in time range)  0.9 %  sodium chloride  infusion ( Intravenous New Bag/Given 12/12/23 1327)  sertraline  (ZOLOFT ) tablet 50 mg (has no administration in time range)  prazosin  (MINIPRESS ) capsule 1 mg (has no administration in time range)  mirtazapine  (REMERON ) tablet 7.5 mg (has no administration in time range)  lactated ringers  bolus 2,000 mL (0 mLs Intravenous Stopped 12/11/23 2309)  ondansetron  (ZOFRAN ) injection 4 mg (4 mg Intravenous Given 12/11/23 1609)  magnesium  sulfate IVPB 2 g 50 mL (0 g Intravenous Stopped 12/12/23 0326)  potassium chloride  SA (KLOR-CON  M) CR tablet 40 mEq (40 mEq Oral Given 12/12/23 1325)    Mobility walks     Focused Assessments Pt is alert and oriented. Independent.    R Recommendations: See Admitting Provider Note  Report given to:   Additional Notes: Pleasant alert and oriented.

## 2023-12-13 DIAGNOSIS — N179 Acute kidney failure, unspecified: Secondary | ICD-10-CM | POA: Diagnosis not present

## 2023-12-13 LAB — CBC
HCT: 33.1 % — ABNORMAL LOW (ref 39.0–52.0)
Hemoglobin: 11.2 g/dL — ABNORMAL LOW (ref 13.0–17.0)
MCH: 30 pg (ref 26.0–34.0)
MCHC: 33.8 g/dL (ref 30.0–36.0)
MCV: 88.7 fL (ref 80.0–100.0)
Platelets: 167 10*3/uL (ref 150–400)
RBC: 3.73 MIL/uL — ABNORMAL LOW (ref 4.22–5.81)
RDW: 13.7 % (ref 11.5–15.5)
WBC: 4.1 10*3/uL (ref 4.0–10.5)
nRBC: 0 % (ref 0.0–0.2)

## 2023-12-13 LAB — BASIC METABOLIC PANEL
Anion gap: 12 (ref 5–15)
BUN: 12 mg/dL (ref 6–20)
CO2: 25 mmol/L (ref 22–32)
Calcium: 8.9 mg/dL (ref 8.9–10.3)
Chloride: 98 mmol/L (ref 98–111)
Creatinine, Ser: 0.85 mg/dL (ref 0.61–1.24)
GFR, Estimated: >60 mL/min (ref >60)
Glucose, Bld: 110 mg/dL — ABNORMAL HIGH (ref 70–99)
Potassium: 3.4 mmol/L — ABNORMAL LOW (ref 3.5–5.1)
Sodium: 135 mmol/L (ref 135–145)

## 2023-12-13 MED ORDER — SERTRALINE HCL 50 MG PO TABS
50.0000 mg | ORAL_TABLET | Freq: Every day | ORAL | 0 refills | Status: DC
Start: 2023-12-14 — End: 2024-03-02

## 2023-12-13 MED ORDER — PRAZOSIN HCL 1 MG PO CAPS: 1.0000 mg | ORAL_CAPSULE | Freq: Every day | ORAL | 0 refills | Status: DC

## 2023-12-13 MED ORDER — MIRTAZAPINE 7.5 MG PO TABS: 7.5000 mg | ORAL_TABLET | Freq: Every day | ORAL | 0 refills | Status: DC

## 2023-12-13 NOTE — Progress Notes (Signed)
 Patient very upset and venting his frustration concerning his care that he has received. Patient stated that we are not addressing his pain, or his dehydration/hydration. Patient voiced his concern of the care that he received in the ED. This nurse listen to patient concerns and did notify NP. Will continue to monitor pt for concerns.

## 2023-12-13 NOTE — Discharge Summary (Signed)
 Richard Lopez FMW:[id number] DOB: January 12, 1976 DOA: 12/11/2023  PCP: Celestia Rosaline SQUIBB, NP  Admit date: 12/11/2023  Discharge date: 12/13/2023  Admitted From: Home   disposition: Home  Recommendations for Outpatient Follow-up:   Follow up with PCP within 1 to 2 weeks to get medication refills and to get further workup of chronic anemia. Follow-up with outpatient mental health to continue CBT  Home Health: N/A Equipment/Devices: N/A Consultations: Psychiatry Discharge Condition: Improved CODE STATUS: Full Diet Recommendation: Heart Healthy   Diet Order             Diet regular Room service appropriate? Yes; Fluid consistency: Thin  Diet effective now                    Chief Complaint  Patient presents with   Dehydration     Brief history of present illness from the day of admission and additional interim summary     Richard Lopez is a 48 y.o. male with medical history significant for anxiety and depression, treated PE, well-controlled T2DM, HTN, esophagitis, substance use disorder, gastritis, and gout with tophi who presents to the ED for evaluation of dehydration.  He reports that over the last week, she has been very depressed due to a personal issue. He has not been eating or drinking much water over the last week.  He reports vomiting last Saturday then again yesterday with mostly stomach acid.  He also reports having muscle cramps that started last night.  He endorsed feeling weak but denies any dizziness, chest pain, shortness of breath, abdominal pain, diarrhea or constipation. He denies any SI or HI.   ED Course: Initial vitals showed temp 98, RR 18, HR 102, BP 153/96, SpO2 98% on room air.  Labs significant for sodium 127, K+ 4.3, glucose 155, BUN/creatinine 22/2.38, normal CBC, normal lipase.  Patient  received 2 L of IV LR and IV Zofran  4 mg x 1 in the ED.                                                                  Hospital Course   Patient was treated with aggressive fluid resuscitation as well as supplementation of potassium.  Patient had improvement and normalization of his kidney function with fluid resuscitation.   Was seen by psychiatry and was started on mirtazapine  and Zoloft  and prazosin  for nightmares.  He was recommended to follow-up with outpatient psychiatry.  TOC gave him a list of possibilities for him, notes he already has a counselor but is not happy with the care he has been getting there.  Denies any SI or HI.  Patient was informed that his PCP can also renew his mirtazapine , Zoloft  and prazosin  if he is unable to find a psychiatrist within the month.  Was noted to have chronic anemia for at least 1 year.  He will need to be worked up as an outpatient by his PCP.  On day of discharge patient was eating up big breakfast and had no difficulty with either appetite or nausea or vomiting.    Discharge diagnosis     Active Problems:   AKI (acute kidney injury) [facility])    Discharge instructions    Discharge Instructions     Discharge instructions   Complete by: As directed    You have been started on 3 medications for your anxiety which should help you a lot.  I am giving you a 1 month supply of each of these medications.  Your primary care doctor can continue to prescribe them after that.  The social worker will give you a list of places that you can call to follow-up with a mental health provider as well.  Make sure to continue to eat and drink well.   Increase activity slowly   Complete by: As directed        Discharge Medications   Allergies as of 12/13/2023       Reactions   Ativan  [lorazepam ] Anxiety, Other (See Comments)   Hallucinations   Nsaids Other (See Comments)   Hx of esophageal ulcer        Medication List     STOP taking these  medications    ondansetron  4 MG tablet Commonly known as: Zofran    oxyCODONE  5 MG immediate release tablet Commonly known as: Oxy IR/ROXICODONE    sucralfate  1 g tablet Commonly known as: Carafate        TAKE these medications    amLODipine  10 MG tablet Commonly known as: NORVASC  Take 1 tablet (10 mg total) by mouth daily.   feeding supplement Liqd Take 237 mLs by mouth 3 (three) times daily between meals. What changed:  when to take this additional instructions   mirtazapine  7.5 MG tablet Commonly known as: REMERON  Take 1 tablet (7.5 mg total) by mouth at bedtime. For anxiety and to help sleep   multivitamin with minerals Tabs tablet Take 1 tablet by mouth daily.   pantoprazole  40 MG tablet Commonly known as: Protonix  Take 1 tablet (40 mg total) by mouth 2 (two) times daily before a meal. What changed:  when to take this additional instructions   prazosin  1 MG capsule Commonly known as: MINIPRESS  Take 1 capsule (1 mg total) by mouth at bedtime. To prevent nightmares   sertraline  50 MG tablet Commonly known as: ZOLOFT  Take 1 tablet (50 mg total) by mouth daily. For anxiety and depression Start taking on: December 14, 2023   Tylenol  8 Hour Arthritis Pain 650 MG CR tablet Generic drug: acetaminophen  Take 1,300 mg by mouth every 8 (eight) hours as needed (for arthritic pain). What changed: Another medication with the same name was removed. Continue taking this medication, and follow the directions you see here.          Major procedures and Radiology Reports - PLEASE review detailed and final reports thoroughly  -        No results found.  Micro Results    Recent Results (from the past 240 hours)  Resp panel by RT-PCR (RSV, Flu A&B, Covid) Anterior Nasal Swab     Status: None   Collection Time: 12/11/23  4:12 PM   Specimen: Anterior Nasal Swab  Result Value Ref Range Status   SARS Coronavirus 2 by RT PCR NEGATIVE NEGATIVE Final  Comment:  (NOTE) SARS-CoV-2 target nucleic acids are NOT DETECTED.  The SARS-CoV-2 RNA is generally detectable in upper respiratory specimens during the acute phase of infection. The lowest concentration of SARS-CoV-2 viral copies this assay can detect is 138 copies/mL. A negative result does not preclude SARS-Cov-2 infection and should not be used as the sole basis for treatment or other patient management decisions. A negative result may occur with  improper specimen collection/handling, submission of specimen other than nasopharyngeal swab, presence of viral mutation(s) within the areas targeted by this assay, and inadequate number of viral copies(<138 copies/mL). A negative result must be combined with clinical observations, patient history, and epidemiological information. The expected result is Negative.  Fact Sheet for Patients:  bloggercourse.com  Fact Sheet for Healthcare Providers:  seriousbroker.it  This test is no t yet approved or cleared by the [place]  FDA and  has been authorized for detection and/or diagnosis of SARS-CoV-2 by FDA under an Emergency Use Authorization (EUA). This EUA will remain  in effect (meaning this test can be used) for the duration of the COVID-19 declaration under Section 564(b)(1) of the Act, 21 U.S.C.section 360bbb-3(b)(1), unless the authorization is terminated  or revoked sooner.       Influenza A by PCR NEGATIVE NEGATIVE Final   Influenza B by PCR NEGATIVE NEGATIVE Final    Comment: (NOTE) The Xpert Xpress SARS-CoV-2/FLU/RSV plus assay is intended as an aid in the diagnosis of influenza from Nasopharyngeal swab specimens and should not be used as a sole basis for treatment. Nasal washings and aspirates are unacceptable for Xpert Xpress SARS-CoV-2/FLU/RSV testing.  Fact Sheet for Patients: bloggercourse.com  Fact Sheet for Healthcare  Providers: seriousbroker.it  This test is not yet approved or cleared by the [place]  FDA and has been authorized for detection and/or diagnosis of SARS-CoV-2 by FDA under an Emergency Use Authorization (EUA). This EUA will remain in effect (meaning this test can be used) for the duration of the COVID-19 declaration under Section 564(b)(1) of the Act, 21 U.S.C. section 360bbb-3(b)(1), unless the authorization is terminated or revoked.     Resp Syncytial Virus by PCR NEGATIVE NEGATIVE Final    Comment: (NOTE) Fact Sheet for Patients: bloggercourse.com  Fact Sheet for Healthcare Providers: seriousbroker.it  This test is not yet approved or cleared by the [place]  FDA and has been authorized for detection and/or diagnosis of SARS-CoV-2 by FDA under an Emergency Use Authorization (EUA). This EUA will remain in effect (meaning this test can be used) for the duration of the COVID-19 declaration under Section 564(b)(1) of the Act, 21 U.S.C. section 360bbb-3(b)(1), unless the authorization is terminated or revoked.  Performed at [facility], [address], [place], [place] [address]     Today   Subjective    Richard Lopez feels much improved since admission.  Feels ready to go home.  Denies chest pain, shortness of breath or abdominal pain.  Feels they can take care of themselves with the resources they have at home.  Objective   Blood pressure (!) 118/59, pulse 77, temperature 98.3 F (36.8 C), temperature source Oral, resp. rate 16, height 6' 6 (1.981 m), weight 108.9 kg, SpO2 100%.   Intake/Output Summary (Last 24 hours) at 12/13/2023 1054 Last data filed at 12/13/2023 0914 Gross per 24 hour  Intake 520 ml  Output --  Net 520 ml    Exam General: Patient appears well and in good spirits sitting up in bed in no acute distress.  Eyes: sclera anicteric, conjuctiva mild  injection bilaterally CVS: S1-S2, regular  Respiratory:  decreased air entry bilaterally secondary to decreased inspiratory effort, rales at bases  GI: NABS, soft, NT  LE: No edema.  Neuro: A/O x 3, Moving all extremities equally with normal strength, CN 3-12 intact, grossly nonfocal.  Psych: patient is logical and coherent, judgement and insight appear normal, mood and affect appropriate to situation.    Data Review   CBC w Diff:  Lab Results  Component Value Date   WBC 4.1 12/13/2023   HGB 11.2 (L) 12/13/2023   HGB 10.2 (L) 08/22/2019   HCT 33.1 (L) 12/13/2023   HCT 30.7 (L) 08/22/2019   PLT 167 12/13/2023   PLT 383 08/22/2019   LYMPHOPCT 45 08/20/2023   MONOPCT 12 08/20/2023   EOSPCT 2 08/20/2023   BASOPCT 1 08/20/2023    CMP:  Lab Results  Component Value Date   NA 135 12/13/2023   NA 140 07/16/2020   K 3.4 (L) 12/13/2023   CL 98 12/13/2023   CO2 25 12/13/2023   BUN 12 12/13/2023   BUN 5 (L) 07/16/2020   CREATININE 0.85 12/13/2023   PROT 9.5 (H) 12/11/2023   PROT 7.7 07/16/2020   ALBUMIN  4.8 12/11/2023   ALBUMIN  4.4 07/16/2020   BILITOT 1.1 12/11/2023   BILITOT <0.2 07/16/2020   ALKPHOS 64 12/11/2023   AST 64 (H) 12/11/2023   ALT 34 12/11/2023  .   Total Time in preparing paper work, data evaluation and todays exam - 35 minutes  Brent Noto Tublu Ivylynn Hoppes M.D on 12/13/2023 at 10:54 AM  [facility]

## 2023-12-13 NOTE — Plan of Care (Signed)
 Pt discharge to home independently. Discharge education done at bedside. Pt verbalized understanding of follow up appointments and medication instructions. Pt noted to be leaving facility with all of his belongings. Vitals stable. IV removed.  Problem: Education: Goal: Knowledge of General Education information will improve Description: Including pain rating scale, medication(s)/side effects and non-pharmacologic comfort measures Outcome: Adequate for Discharge   Problem: Health Behavior/Discharge Planning: Goal: Ability to manage health-related needs will improve Outcome: Adequate for Discharge   Problem: Clinical Measurements: Goal: Ability to maintain clinical measurements within normal limits will improve Outcome: Adequate for Discharge Goal: Will remain free from infection Outcome: Adequate for Discharge Goal: Diagnostic test results will improve Outcome: Adequate for Discharge Goal: Respiratory complications will improve Outcome: Adequate for Discharge Goal: Cardiovascular complication will be avoided Outcome: Adequate for Discharge   Problem: Activity: Goal: Risk for activity intolerance will decrease Outcome: Adequate for Discharge   Problem: Nutrition: Goal: Adequate nutrition will be maintained Outcome: Adequate for Discharge   Problem: Coping: Goal: Level of anxiety will decrease Outcome: Adequate for Discharge   Problem: Elimination: Goal: Will not experience complications related to bowel motility Outcome: Adequate for Discharge Goal: Will not experience complications related to urinary retention Outcome: Adequate for Discharge   Problem: Pain Managment: Goal: General experience of comfort will improve and/or be controlled Outcome: Adequate for Discharge   Problem: Safety: Goal: Ability to remain free from injury will improve Outcome: Adequate for Discharge   Problem: Skin Integrity: Goal: Risk for impaired skin integrity will decrease Outcome: Adequate  for Discharge

## 2023-12-13 NOTE — Progress Notes (Signed)
 PROGRESS NOTE    Richard Lopez  FMW:[id number]  DOB: 1975-11-10  DOA: 12/11/2023 PCP: Celestia Rosaline SQUIBB, NP Outpatient Specialists:   Hospital course:  48 year old man with anxiety and depression followed by an outpatient therapist, possible PTSD, PE VTE, h/o substance abuse disorder was admitted for weakness and fatigue.  Workup revealed AKI and patient admits to not eating or drinking very much over the past week.  He admits to some vomiting the prior week but basically states that he is so anxious that he forgets to eat.   Subjective:  Patient states that he is tired but a little better since yesterday.  Notes he has no appetite.   Is agreeable to see a psychiatrist.  Admits that he has a lot of anxiety.  Objective: Vitals:   12/12/23 1600 12/12/23 1720 12/12/23 2038 12/13/23 0401  BP: (!) 149/83 (!) 140/86 139/87 (!) 145/88  Pulse: 65 93 66 77  Resp:  20 16 16   Temp:  98.8 F (37.1 C) 98.7 F (37.1 C) 98.3 F (36.8 C)  TempSrc:  Oral Oral Oral  SpO2: 100% 100% 98% 100%  Weight:      Height:        Intake/Output Summary (Last 24 hours) at 12/13/2023 0943 Last data filed at 12/13/2023 0914 Gross per 24 hour  Intake 520 ml  Output --  Net 520 ml   Filed Weights   12/11/23 1256  Weight: 108.9 kg     Exam:  General: Tired appearing man sitting up in bed in NAD Eyes: sclera anicteric, conjuctiva mild injection bilaterally CVS: S1-S2, regular  Respiratory:  decreased air entry bilaterally secondary to decreased inspiratory effort, rales at bases  GI: NABS, soft, NT  LE: Warm and well-perfused Neuro: A/O x 3,  grossly nonfocal.    Data Reviewed:  Basic Metabolic Panel: Recent Labs  Lab 12/11/23 1317 12/12/23 0440 12/12/23 1713 12/13/23 0350  NA 127* 129* 131* 135  K 4.3 2.8* 3.9 3.4*  CL 83* 90* 91* 98  CO2 23 25 27 25   GLUCOSE 155* 109* 156* 110*  BUN 22* 20 15 12   CREATININE 2.38* 1.38* 0.92 0.85  CALCIUM  9.5 8.6* 9.0 8.9  MG 1.6* 2.1  --    --   PHOS 4.6  --   --   --     CBC: Recent Labs  Lab 12/11/23 1317 12/12/23 0440 12/13/23 0350  WBC 5.6 4.4 4.1  HGB 13.4 10.9* 11.2*  HCT 40.1 31.8* 33.1*  MCV 87.2 86.2 88.7  PLT 179 146* 167     Scheduled Meds:  amLODipine   10 mg Oral Daily   enoxaparin  (LOVENOX ) injection  40 mg Subcutaneous Q24H   influenza vac split trivalent PF  0.5 mL Intramuscular Tomorrow-1000   mirtazapine   7.5 mg Oral QHS   multivitamin with minerals  1 tablet Oral Daily   pantoprazole   40 mg Oral BID AC   prazosin   1 mg Oral QHS   sertraline   50 mg Oral Daily   Continuous Infusions:   Assessment & Plan:   AKI Electrolyte abnormalities Chronic hyponatremia Patient with decreased p.o. intake for the past week Potassium has been aggressively repleted Creatinine improving with IV fluid resuscitation  Anxiety and depression Decreased p.o. intake Patient agreeable to see psychiatrist while he is here, states he does not like his outpatient therapist Denies any suicidal ideation Inpatient psychiatry consult placed.  Tophaceous gout Rheumatology follow-up as an outpatient    DVT prophylaxis: Lovenox  Code  Status: Full Family Communication: None today     Studies: No results found.  Active Problems:   AKI (acute kidney injury) (HCC)     Monti Villers Vangie Pike, [facility]  If 7PM-7AM, please contact night-coverage [email or web address]   LOS: 1 day

## 2023-12-13 NOTE — TOC Transition Note (Addendum)
 Transition of Care [facility]) - Discharge Note   Patient Details  Name: Richard Lopez MRN: [id number] Date of Birth: 06/21/1976  Transition of Care [facility]) CM/SW Contact:  Rosalva Jon Bloch, RN Phone Number: 12/13/2023, 12:20 PM   Clinical Narrative:    Patient will DC to: home  Anticipated DC date: 12/13/2023 Family notified: yes Transport by: Venia  Presented with dehydration, hx of anxiety and depression, PE, T2DM, HTN, esophagitis, substance use disorder, gastritis, and gout with tophi.  Per MD patient ready for DC today. RN, and patient aware of DC. Pt states resides alone, has good family/ friend support. Post hospital f/u noted on AVS. Pt without  RX med concerns. States will get a taxi for transportation to home. Psych and counseling resources provided to pt ( see below). Pt states not happy with current counselor. Requested resource list.  Psychiatry and Counseling  [facility]  [address]  [place], [place]  [phone]   Psychiatrists  [facility]  [address]  [place], [place] [address] [place], [place] [address]  [phone]   Dr. Elna Lo [facility]  [address] [place] [address] [place] [address]  [phone]   Ima Anon, MD [facility]  [address] [place] [address] [place] [address]  [phone]   Therapists  [facility]  [address] [place], [place]  [phone]   [facility]  [address] [place] [address] [place], [place]  [phone]   [facility]  [address]  [place], [place] [address] [place], [place] [address]  [phone]   [facility]  [address]  [place], [place] [address] [place], [place] [address]  [phone]   RNCM will sign off for now as intervention is no longer needed. Please consult us  again if new needs arise.   Final next level of care: Home/Self Care (pt states he will get taxi for transportation home, family currently @ church.) Barriers to Discharge: No Barriers Identified   Patient Goals and CMS Choice            Discharge Placement                       Discharge Plan and Services Additional resources added to the After Visit Summary for                                       Social Drivers of Health (SDOH) Interventions SDOH Screenings   Food Insecurity: Food Insecurity Present (12/12/2023)  Housing: Low Risk  (12/12/2023)  Transportation Needs: Patient Declined (12/12/2023)  Utilities: Patient Declined (12/12/2023)  Recent Concern: Utilities - At Risk (09/16/2023)  Alcohol Screen: Low Risk  (09/16/2023)  Depression (PHQ2-9): High Risk (09/16/2023)  Financial Resource Strain: Medium Risk (09/16/2023)  Physical Activity: Unknown (11/07/2018)  Social Connections: Moderately Isolated (09/16/2023)  Stress: Stress Concern Present (09/16/2023)  Tobacco Use: Medium Risk (12/11/2023)  Health  Literacy: Inadequate Health Literacy (09/16/2023)     Readmission Risk Interventions    08/25/2023   12:27 PM 04/21/2023    2:37 PM  Readmission Risk Prevention Plan  Transportation Screening Complete Complete  PCP or Specialist Appt within 3-5 Days Complete Complete  HRI or Home Care Consult Complete Complete  Social Work Consult for Recovery Care Planning/Counseling Complete Complete  Palliative Care Screening Not  Applicable Not Applicable  Medication Review Oceanographer) Complete Complete

## 2023-12-14 ENCOUNTER — Telehealth: Payer: Self-pay

## 2023-12-14 ENCOUNTER — Encounter (HOSPITAL_COMMUNITY): Payer: Self-pay | Admitting: *Deleted

## 2023-12-14 ENCOUNTER — Inpatient Hospital Stay (HOSPITAL_COMMUNITY)
Admission: EM | Admit: 2023-12-14 | Discharge: 2023-12-16 | DRG: 682 | Disposition: A | Discharge status: Home / Self Care | Payer: Medicaid Other | Attending: Internal Medicine | Admitting: Internal Medicine

## 2023-12-14 ENCOUNTER — Other Ambulatory Visit: Payer: Self-pay

## 2023-12-14 ENCOUNTER — Ambulatory Visit (INDEPENDENT_AMBULATORY_CARE_PROVIDER_SITE_OTHER): Payer: Self-pay | Admitting: Primary Care

## 2023-12-14 DIAGNOSIS — I10 Essential (primary) hypertension: Secondary | ICD-10-CM | POA: Diagnosis present

## 2023-12-14 DIAGNOSIS — Z886 Allergy status to analgesic agent status: Secondary | ICD-10-CM

## 2023-12-14 DIAGNOSIS — M109 Gout, unspecified: Secondary | ICD-10-CM | POA: Diagnosis present

## 2023-12-14 DIAGNOSIS — K2900 Acute gastritis without bleeding: Secondary | ICD-10-CM | POA: Diagnosis present

## 2023-12-14 DIAGNOSIS — M1990 Unspecified osteoarthritis, unspecified site: Secondary | ICD-10-CM | POA: Diagnosis present

## 2023-12-14 DIAGNOSIS — E785 Hyperlipidemia, unspecified: Secondary | ICD-10-CM | POA: Diagnosis present

## 2023-12-14 DIAGNOSIS — Z86711 Personal history of pulmonary embolism: Secondary | ICD-10-CM | POA: Diagnosis not present

## 2023-12-14 DIAGNOSIS — I160 Hypertensive urgency: Secondary | ICD-10-CM | POA: Diagnosis present

## 2023-12-14 DIAGNOSIS — R1116 Cannabis hyperemesis syndrome: Secondary | ICD-10-CM | POA: Diagnosis present

## 2023-12-14 DIAGNOSIS — Z79899 Other long term (current) drug therapy: Secondary | ICD-10-CM | POA: Diagnosis not present

## 2023-12-14 DIAGNOSIS — Z87891 Personal history of nicotine dependence: Secondary | ICD-10-CM | POA: Diagnosis not present

## 2023-12-14 DIAGNOSIS — Z888 Allergy status to other drugs, medicaments and biological substances status: Secondary | ICD-10-CM | POA: Diagnosis not present

## 2023-12-14 DIAGNOSIS — M1A9XX1 Chronic gout, unspecified, with tophus (tophi): Secondary | ICD-10-CM | POA: Diagnosis present

## 2023-12-14 DIAGNOSIS — E872 Acidosis, unspecified: Secondary | ICD-10-CM | POA: Diagnosis present

## 2023-12-14 DIAGNOSIS — I480 Paroxysmal atrial fibrillation: Secondary | ICD-10-CM | POA: Diagnosis present

## 2023-12-14 DIAGNOSIS — Z8249 Family history of ischemic heart disease and other diseases of the circulatory system: Secondary | ICD-10-CM | POA: Diagnosis not present

## 2023-12-14 DIAGNOSIS — F419 Anxiety disorder, unspecified: Secondary | ICD-10-CM | POA: Diagnosis present

## 2023-12-14 DIAGNOSIS — Z1152 Encounter for screening for COVID-19: Secondary | ICD-10-CM

## 2023-12-14 DIAGNOSIS — F1210 Cannabis abuse, uncomplicated: Secondary | ICD-10-CM | POA: Diagnosis present

## 2023-12-14 DIAGNOSIS — E871 Hypo-osmolality and hyponatremia: Secondary | ICD-10-CM | POA: Diagnosis present

## 2023-12-14 DIAGNOSIS — R112 Nausea with vomiting, unspecified: Secondary | ICD-10-CM | POA: Diagnosis not present

## 2023-12-14 DIAGNOSIS — F32A Depression, unspecified: Secondary | ICD-10-CM

## 2023-12-14 DIAGNOSIS — N179 Acute kidney failure, unspecified: Principal | ICD-10-CM | POA: Diagnosis present

## 2023-12-14 DIAGNOSIS — K2091 Esophagitis, unspecified with bleeding: Secondary | ICD-10-CM | POA: Diagnosis present

## 2023-12-14 DIAGNOSIS — E1169 Type 2 diabetes mellitus with other specified complication: Secondary | ICD-10-CM | POA: Diagnosis present

## 2023-12-14 DIAGNOSIS — E876 Hypokalemia: Secondary | ICD-10-CM | POA: Diagnosis present

## 2023-12-14 DIAGNOSIS — F1290 Cannabis use, unspecified, uncomplicated: Secondary | ICD-10-CM | POA: Diagnosis present

## 2023-12-14 DIAGNOSIS — Z833 Family history of diabetes mellitus: Secondary | ICD-10-CM

## 2023-12-14 LAB — GLUCOSE, CAPILLARY: Glucose-Capillary: 200 mg/dL — ABNORMAL HIGH (ref 70–99)

## 2023-12-14 LAB — COMPREHENSIVE METABOLIC PANEL
ALT: 46 U/L — ABNORMAL HIGH (ref 0–44)
AST: 60 U/L — ABNORMAL HIGH (ref 15–41)
Albumin: 5.2 g/dL — ABNORMAL HIGH (ref 3.5–5.0)
Alkaline Phosphatase: 66 U/L (ref 38–126)
Anion gap: 18 — ABNORMAL HIGH (ref 5–15)
BUN: 16 mg/dL (ref 6–20)
CO2: 22 mmol/L (ref 22–32)
Calcium: 10.6 mg/dL — ABNORMAL HIGH (ref 8.9–10.3)
Chloride: 88 mmol/L — ABNORMAL LOW (ref 98–111)
Creatinine, Ser: 2.18 mg/dL — ABNORMAL HIGH (ref 0.61–1.24)
GFR, Estimated: 37 mL/min — ABNORMAL LOW (ref >60)
Glucose, Bld: 181 mg/dL — ABNORMAL HIGH (ref 70–99)
Potassium: 4.3 mmol/L (ref 3.5–5.1)
Sodium: 128 mmol/L — ABNORMAL LOW (ref 135–145)
Total Bilirubin: 0.8 mg/dL (ref 0.0–1.2)
Total Protein: 9.8 g/dL — ABNORMAL HIGH (ref 6.5–8.1)

## 2023-12-14 LAB — CBC WITH DIFFERENTIAL/PLATELET
Abs Immature Granulocytes: 0.01 10*3/uL (ref 0.00–0.07)
Basophils Absolute: 0 10*3/uL (ref 0.0–0.1)
Basophils Relative: 1 %
Eosinophils Absolute: 0 10*3/uL (ref 0.0–0.5)
Eosinophils Relative: 0 %
HCT: 39.3 % (ref 39.0–52.0)
Hemoglobin: 13.6 g/dL (ref 13.0–17.0)
Immature Granulocytes: 0 %
Lymphocytes Relative: 28 %
Lymphs Abs: 1.8 10*3/uL (ref 0.7–4.0)
MCH: 30 pg (ref 26.0–34.0)
MCHC: 34.6 g/dL (ref 30.0–36.0)
MCV: 86.6 fL (ref 80.0–100.0)
Monocytes Absolute: 1.2 10*3/uL — ABNORMAL HIGH (ref 0.1–1.0)
Monocytes Relative: 18 %
Neutro Abs: 3.5 10*3/uL (ref 1.7–7.7)
Neutrophils Relative %: 53 %
Platelets: 325 10*3/uL (ref 150–400)
RBC: 4.54 MIL/uL (ref 4.22–5.81)
RDW: 13.6 % (ref 11.5–15.5)
WBC: 6.5 10*3/uL (ref 4.0–10.5)
nRBC: 0 % (ref 0.0–0.2)

## 2023-12-14 LAB — CBG MONITORING, ED: Glucose-Capillary: 181 mg/dL — ABNORMAL HIGH (ref 70–99)

## 2023-12-14 LAB — LIPASE, BLOOD: Lipase: 36 U/L (ref 11–51)

## 2023-12-14 MED ORDER — ACETAMINOPHEN 650 MG RE SUPP: 650.0000 mg | Freq: Four times a day (QID) | RECTAL | Status: DC | PRN

## 2023-12-14 MED ORDER — INSULIN ASPART 100 UNIT/ML IJ SOLN
0.0000 [IU] | Freq: Every day | INTRAMUSCULAR | Status: DC
Start: 2023-12-15 — End: 2023-12-16

## 2023-12-14 MED ORDER — SORBITOL 70 % SOLN: 30.0000 mL | Status: DC | PRN

## 2023-12-14 MED ORDER — CAMPHOR-MENTHOL 0.5-0.5 % EX LOTN: 1.0000 | TOPICAL_LOTION | Freq: Three times a day (TID) | CUTANEOUS | Status: DC | PRN

## 2023-12-14 MED ORDER — INSULIN ASPART 100 UNIT/ML IJ SOLN
0.0000 [IU] | Freq: Three times a day (TID) | INTRAMUSCULAR | Status: DC
  Administered 2023-12-15 (×2): 3 [IU] via SUBCUTANEOUS

## 2023-12-14 MED ORDER — SERTRALINE HCL 50 MG PO TABS
50.0000 mg | ORAL_TABLET | Freq: Every day | ORAL | Status: DC
  Administered 2023-12-15 – 2023-12-16 (×2): 50 mg via ORAL
  Filled 2023-12-14 (×2): qty 1

## 2023-12-14 MED ORDER — ACETAMINOPHEN 325 MG PO TABS
650.0000 mg | ORAL_TABLET | Freq: Four times a day (QID) | ORAL | Status: DC | PRN
  Administered 2023-12-15: 650 mg via ORAL
  Filled 2023-12-14: qty 2

## 2023-12-14 MED ORDER — DOCUSATE SODIUM 283 MG RE ENEM: 1.0000 | ENEMA | RECTAL | Status: DC | PRN

## 2023-12-14 MED ORDER — ONDANSETRON HCL 4 MG/2ML IJ SOLN
4.0000 mg | Freq: Once | INTRAMUSCULAR | Status: AC
Start: 2023-12-14 — End: 2023-12-14
  Administered 2023-12-14: 4 mg via INTRAVENOUS
  Filled 2023-12-14: qty 2

## 2023-12-14 MED ORDER — PANTOPRAZOLE SODIUM 40 MG PO TBEC
40.0000 mg | DELAYED_RELEASE_TABLET | Freq: Two times a day (BID) | ORAL | Status: DC
  Administered 2023-12-15 – 2023-12-16 (×3): 40 mg via ORAL
  Filled 2023-12-14 (×3): qty 1

## 2023-12-14 MED ORDER — HEPARIN SODIUM (PORCINE) 5000 UNIT/ML IJ SOLN
5000.0000 [IU] | Freq: Three times a day (TID) | INTRAMUSCULAR | Status: DC
  Administered 2023-12-15 – 2023-12-16 (×4): 5000 [IU] via SUBCUTANEOUS
  Filled 2023-12-14 (×4): qty 1

## 2023-12-14 MED ORDER — ONDANSETRON HCL 4 MG/2ML IJ SOLN
4.0000 mg | Freq: Four times a day (QID) | INTRAMUSCULAR | Status: DC | PRN
  Filled 2023-12-14: qty 2

## 2023-12-14 MED ORDER — SODIUM CHLORIDE 0.9 % IV BOLUS
1000.0000 mL | Freq: Once | INTRAVENOUS | Status: AC
  Administered 2023-12-14: 1000 mL via INTRAVENOUS

## 2023-12-14 MED ORDER — SODIUM CHLORIDE 0.9 % IV SOLN
INTRAVENOUS | Status: AC
Start: 2023-12-15 — End: 2023-12-16

## 2023-12-14 MED ORDER — HYDROXYZINE HCL 25 MG PO TABS: 25.0000 mg | ORAL_TABLET | Freq: Three times a day (TID) | ORAL | Status: DC | PRN

## 2023-12-14 MED ORDER — NEPRO/CARBSTEADY PO LIQD: 237.0000 mL | Freq: Three times a day (TID) | ORAL | Status: DC | PRN

## 2023-12-14 MED ORDER — PRAZOSIN HCL 1 MG PO CAPS
1.0000 mg | ORAL_CAPSULE | Freq: Every day | ORAL | Status: DC
  Administered 2023-12-14 – 2023-12-15 (×2): 1 mg via ORAL
  Filled 2023-12-14 (×2): qty 1

## 2023-12-14 MED ORDER — CALCIUM CARBONATE ANTACID 1250 MG/5ML PO SUSP: 500.0000 mg | Freq: Four times a day (QID) | ORAL | Status: DC | PRN

## 2023-12-14 MED ORDER — ONDANSETRON HCL 4 MG PO TABS
4.0000 mg | ORAL_TABLET | Freq: Four times a day (QID) | ORAL | Status: DC | PRN
  Administered 2023-12-15: 4 mg via ORAL
  Filled 2023-12-14: qty 1

## 2023-12-14 MED ORDER — MIRTAZAPINE 15 MG PO TABS
7.5000 mg | ORAL_TABLET | Freq: Every day | ORAL | Status: DC
  Administered 2023-12-14 – 2023-12-15 (×2): 7.5 mg via ORAL
  Filled 2023-12-14 (×2): qty 1

## 2023-12-14 NOTE — ED Triage Notes (Addendum)
 Here by POV from home for abd pain, cramping. Also reports NV, and generalized cramping. "Cramping in abd, hands, legs, feet every time I move". Onset Thursday. Seen Friday in ED for the same, admitted and subsequently d/c'd yesterday. D/c'd with Dx r/t dehydration. Returns for continued/ worsening sx. Endorses hasn't been eating or drinking much 2/2 depression. Alert, NAD, calm, interactive. Frequent yawning. States, "haven't been able to sleep". Last emesis this am. Occurs Q4-5 hrs.

## 2023-12-14 NOTE — H&P (Signed)
 History and Physical    Patient: Richard Lopez WGN:[id number] DOB: May 21, 1976 DOA: 12/14/2023 DOS: the patient was seen and examined on 12/14/2023 PCP: Marius Siemens, NP  Patient coming from: Home  Chief Complaint:  Chief Complaint  Patient presents with   Abdominal Pain   HPI: Richard Lopez is a 48 y.o. male with medical history significant of diabetes, previous history of pulmonary embolism, anxiety disorder, essential hypertension, history of gout, depression, history of esophagitis, history of alcohol abuse and substance use disorder who was just discharged yesterday after admission with AKI.  Patient has had nausea vomiting recently.  He was having that leading to AKI.  Was admitted.  Aggressively hydrated.  Creatinine dropped to 0.9 and was discharged home yesterday.  Patient returned today with complaint of another bouts of nausea vomiting.  He vomited at least twice.  Sodium was found to be 128 chloride 88 glucose 181.  Creatinine went to 2.18 from 0.85.  Patient has a gap of 18.  LFTs also notably elevated with AST of 60 ALT of 46.  CBC within normal.  Patient has been readmitted with AKI.  He does have history of alcohol abuse and not sure if he drank alcohol.  Review of Systems: As mentioned in the history of present illness. All other systems reviewed and are negative. Past Medical History:  Diagnosis Date   Anxiety    Arthritis    hands and possibly knee   Depression    Diabetes mellitus    diet controlled   Essential hypertension    Gastritis    Gout    Normocytic anemia due to blood loss 08/21/2019   Pulmonary embolism [facility])    Past Surgical History:  Procedure Laterality Date   BIOPSY  11/12/2022   Procedure: BIOPSY;  Surgeon: Genell Ken, MD;  Location: [facility];  Service: Gastroenterology;;   ESOPHAGOGASTRODUODENOSCOPY (EGD) WITH PROPOFOL  N/A 08/14/2019   Procedure: ESOPHAGOGASTRODUODENOSCOPY (EGD) WITH PROPOFOL ;  Surgeon: Baldo Bonds, MD;  Location:  [facility]OS[facility];  Service: Endoscopy;  Laterality: N/A;   ESOPHAGOGASTRODUODENOSCOPY (EGD) WITH PROPOFOL  N/A 11/12/2022   Procedure: ESOPHAGOGASTRODUODENOSCOPY (EGD) WITH PROPOFOL ;  Surgeon: Genell Ken, MD;  Location: [facility];  Service: Gastroenterology;  Laterality: N/A;   EXTERNAL FIXATION LEG Left 11/07/2018   Procedure: EXTERNAL FIXATION LEFT LOWER LEG;  Surgeon: Adonica Hoose, MD;  Location: [facility] ORS;  Service: Orthopedics;  Laterality: Left;   EXTERNAL FIXATION REMOVAL Left 11/08/2018   Procedure: REMOVAL EXTERNAL FIXATION LEG;  Surgeon: Laneta Pintos, MD;  Location: [facility] OR;  Service: Orthopedics;  Laterality: Left;   INTRAMEDULLARY (IM) NAIL INTERTROCHANTERIC Right 03/26/2019   Procedure: INTRAMEDULLARY (IM) NAIL INTERTROCHANTRIC;  Surgeon: Laneta Pintos, MD;  Location: [facility] OR;  Service: Orthopedics;  Laterality: Right;   INTRAMEDULLARY (IM) NAIL INTERTROCHANTERIC Right 05/17/2019   Procedure: Intramedullary (Im) Nail Intertroch with circlage wiring;  Surgeon: Hardy Lia, MD;  Location: [facility] OR;  Service: Orthopedics;  Laterality: Right;   NO PAST SURGERIES     OPEN REDUCTION INTERNAL FIXATION (ORIF) TIBIA/FIBULA FRACTURE Left 11/08/2018   Procedure: OPEN REDUCTION INTERNAL FIXATION (ORIF) TIBIA/FIBULA FRACTURE;  Surgeon: Laneta Pintos, MD;  Location: [facility] OR;  Service: Orthopedics;  Laterality: Left;   ORIF FEMUR FRACTURE Right 05/17/2019   Procedure: REMOVAL  OF HARDWARE;  Surgeon: Hardy Lia, MD;  Location: [facility] OR;  Service: Orthopedics;  Laterality: Right;   Social History:  reports that he has quit smoking. His smoking use included cigarettes. He has never used smokeless tobacco.  He reports current alcohol use of about 200.0 standard drinks of alcohol per week. He reports current drug use. Drug: Marijuana.  Allergies  Allergen Reactions   Ativan  [Lorazepam ] Anxiety and Other (See Comments)    Hallucinations   Nsaids Other (See Comments)    Hx of esophageal ulcer    Family  History  Problem Relation Age of Onset   Diabetes Mellitus II Father    Diabetes Mellitus II Other    CAD Other     Prior to Admission medications   Medication Sig Start Date End Date Taking? Authorizing Provider  amLODipine  (NORVASC ) 10 MG tablet Take 1 tablet (10 mg total) by mouth daily. 08/27/23 12/11/23  Rai, Hurman Maiden, MD  feeding supplement (ENSURE ENLIVE / ENSURE PLUS) LIQD Take 237 mLs by mouth 3 (three) times daily between meals. Patient taking differently: Take 237 mLs by mouth See admin instructions. Drink 237 ml's by mouth one to two times a day between meals 08/20/23   Kraig Peru, MD  mirtazapine  (REMERON ) 7.5 MG tablet Take 1 tablet (7.5 mg total) by mouth at bedtime. For anxiety and to help sleep 12/13/23   Chatterjee, Srobona Tublu, MD  Multiple Vitamin (MULTIVITAMIN WITH MINERALS) TABS tablet Take 1 tablet by mouth daily. 11/16/22   Aura Leeds Latif, DO  pantoprazole  (PROTONIX ) 40 MG tablet Take 1 tablet (40 mg total) by mouth 2 (two) times daily before a meal. Patient taking differently: Take 40 mg by mouth See admin instructions. Take 40 mg by mouth one to two times a day 30 minutes prior to a meal 08/27/23   Rai, Ripudeep K, MD  prazosin  (MINIPRESS ) 1 MG capsule Take 1 capsule (1 mg total) by mouth at bedtime. To prevent nightmares 12/13/23   Chatterjee, Srobona Tublu, MD  sertraline  (ZOLOFT ) 50 MG tablet Take 1 tablet (50 mg total) by mouth daily. For anxiety and depression 12/14/23   Chatterjee, Srobona Tublu, MD  TYLENOL  8 HOUR ARTHRITIS PAIN 650 MG CR tablet Take 1,300 mg by mouth every 8 (eight) hours as needed (for arthritic pain).    [provider]    Physical Exam: Vitals:   12/14/23 1514 12/14/23 1607 12/14/23 2043  BP: (!) 147/96  (!) 152/110  Pulse: (!) 116  92  Resp: 16  (!) 21  Temp: 97.8 F (36.6 C)  99 F (37.2 C)  TempSrc: Oral  Oral  SpO2: 100%  100%  Weight: 96.3 kg 96.2 kg   Height: 6\' 6"  (1.981 m)     Constitutional: Obese,  acutely ill looking, no distress NAD, calm, comfortable Eyes: PERRL, lids and conjunctivae normal ENMT: Mucous membranes are dry. Posterior pharynx clear of any exudate or lesions.Normal dentition.  Neck: normal, supple, no masses, no thyromegaly Respiratory: clear to auscultation bilaterally, no wheezing, no crackles. Normal respiratory effort. No accessory muscle use.  Cardiovascular: Sinus tachycardia, no murmurs / rubs / gallops. No extremity edema. 2+ pedal pulses. No carotid bruits.  Abdomen: no tenderness, no masses palpated. No hepatosplenomegaly. Bowel sounds positive.  Musculoskeletal: Good range of motion, no joint swelling or tenderness, Skin: no rashes, lesions, ulcers. No induration Neurologic: CN 2-12 grossly intact. Sensation intact, DTR normal. Strength 5/5 in all 4.  Psychiatric: Normal judgment and insight. Alert and oriented x 3. Normal mood  Data Reviewed:  Blood pressure 147/96, pulse 116, sodium 128, chloride 88, glucose 118, BUN is 16 creatinine 2.18.  Calcium  10.6 and gap of 18.  Albumin  is 5.2.  AST 60 ALT 46.  Assessment and Plan:  #1 recurrent AKI: Appears to be prerenal.  Patient has had some nausea with vomiting.  Patient just got discharged yesterday.  Is not clear what happened that patient will come back within 24 hours with the same symptoms.  May have taken some medications that should not be taken.  Will get drug screen.  Check alcohol level.  Worrisome about the use of Methanol and ethenyl Alcohol.  Especially with the anion gap.  Continue to hydrate.  #2 essential hypertension: Patient is on amlodipine  from home which we will continue with.  Continue  #3 gout: Continue home regimen  #4 acute gastritis and esophagitis: Will continue PPIs  #5 anxiety with depression: Was seen by psychiatry the last time.  New medications started this include mirtazapine , Zoloft .  #6 type 2 diabetes: Has been diet controlled.  Sliding scale insulin .    Advance Care  Planning:   Code Status: Full Code   Consults: None  Family Communication: No family bedside  Severity of Illness: The appropriate patient status for this patient is INPATIENT. Inpatient status is judged to be reasonable and necessary in order to provide the required intensity of service to ensure the patient's safety. The patient's presenting symptoms, physical exam findings, and initial radiographic and laboratory data in the context of their chronic comorbidities is felt to place them at high risk for further clinical deterioration. Furthermore, it is not anticipated that the patient will be medically stable for discharge from the hospital within 2 midnights of admission.   * I certify that at the point of admission it is my clinical judgment that the patient will require inpatient hospital care spanning beyond 2 midnights from the point of admission due to high intensity of service, high risk for further deterioration and high frequency of surveillance required.*  AuthorCarolin Chyle, MD 12/14/2023 9:04 PM  For on call review [email or web address].

## 2023-12-14 NOTE — Transitions of Care (Post Inpatient/ED Visit) (Signed)
   12/14/2023  Name: Richard Lopez MRN: [id number] DOB: [birth year]  Today's TOC FU Call Status: Today's TOC FU Call Status:: Successful TOC FU Call Completed TOC FU Call Complete Date: 12/14/23 Patient's Name and Date of Birth confirmed.  Transition Care Management Follow-up Telephone Call Date of Discharge: 12/13/23 Discharge Facility: [facility]) Type of Discharge: Inpatient Admission Primary Inpatient Discharge Diagnosis:: dehydration How have you been since you were released from the hospital?: Worse (He said he feels dehydrated and is cramping and is on his way back to the ED) Any questions or concerns?: Yes Patient Questions/Concerns:: yes, noted above regarding how he is currently feeling. Patient Questions/Concerns Addressed: Other: (on way back ot ED)  Items Reviewed: Did you receive and understand the discharge instructions provided?: Yes Medications obtained,verified, and reconciled?: No Medications Not Reviewed Reasons:: Other: (He said he has all medications and did not have any questions about the med regime.  he is on his way back to the hospital) Any new allergies since your discharge?: No Dietary orders reviewed?: No Do you have support at home?: Yes People in Home: parent(s) Name of Support/Comfort Primary Source: his mother and his girlfriend  Medications Reviewed Today: Medications Reviewed Today   Medications were not reviewed in this encounter     Home Care and Equipment/Supplies: Were Home Health Services Ordered?: No Any new equipment or medical supplies ordered?: No  Functional Questionnaire: Do you need assistance with bathing/showering or dressing?: No Do you need assistance with meal preparation?: Yes (his mother and girlfriend assist) Do you need assistance with eating?: No Do you have difficulty maintaining continence: No Do you need assistance with getting out of bed/getting out of a chair/moving?: Yes (has cane for ambulation) Do you have  difficulty managing or taking your medications?: No  Follow up appointments reviewed: PCP Follow-up appointment confirmed?: Yes Date of PCP follow-up appointment?: 12/17/23 Follow-up Provider: Madelyn Schick, NP Specialist Hospital Follow-up appointment confirmed?: No Reason Specialist Follow-Up Not Confirmed:  (I asked about follow up with beh. health and he said he needs to do that; but is not sure what to do. I offered to refer him back to C. Hall, LCSWA to assist  and he said he did not want to be referred back to her. He agreed to a [facility] referral.) Do you need transportation to your follow-up appointment?: No Do you understand care options if your condition(s) worsen?: Yes-patient verbalized understanding (currently on way back to ED)  VBCI referral placed,     SIGNATURE Burnett Carson, RN

## 2023-12-14 NOTE — ED Provider Triage Note (Signed)
Emergency Medicine Provider Triage Evaluation Note  Richard Lopez , a 48 y.o. male  was evaluated in triage.  Pt complains of epigastric cramping / pain, leg cramps starting since last night. Was previously admitted for dehydration causing cramps discharged 12/11/2023. States he vomited this AM, nausea. States he has been depressed lately and has "not been eating or drinking as supposed to." Also reports vomiting blood 2 days ago.   Denies alcohol use, drug use.   Denies fevers, headaches, vision changes, chest pain, shortness of breath, diarrhea, dysuria, hematuria, melena, hematochezia  Review of Systems  Positive: N/a Negative: N/a  Physical Exam  BP (!) 147/96   Pulse (!) 116   Temp 97.8 F (36.6 C) (Oral)   Resp 16   Ht 6\' 6"  (1.981 m)   Wt 96.2 kg   SpO2 100%   BMI 24.50 kg/m  Gen:   Awake, no distress   Resp:  Normal effort  MSK:   Moves extremities without difficulty  Other:    Medical Decision Making  Medically screening exam initiated at 4:38 PM.  Appropriate orders placed.  Richard Lopez was informed that the remainder of the evaluation will be completed by another provider, this initial triage assessment does not replace that evaluation, and the importance of remaining in the ED until their evaluation is complete.     Lunette Stands, [place] 12/14/23 [phone]

## 2023-12-14 NOTE — Telephone Encounter (Signed)
 Copied from CRM [id number]. Topic: Clinical - Red Word Triage >> Dec 14, 2023  2:19 PM Ivette P wrote: Red Word that prompted transfer to Nurse Triage: doct appt wednesday, been in the hospital all weekend discharged yesterday, still feeling the same. Dehydration.   Chief Complaint: muscle cramps Symptoms: dry mouth, vomiting  Frequency: ongoing since yesterday Pertinent Negatives: Patient denies fever Disposition: [x] ED /[] Urgent Care (no appt availability in office) / [] Appointment(In office/virtual)/ []  [facility]/ [] Home Care/ [] Refused Recommended Disposition /[] [facility]/ []  Follow-up with PCP Additional Notes: The patient reported muscle cramps in his legs, feet, and abdomen 7-8/10.  He urinated once this morning at 6 am.  He has been drinking fluids but has not eaten.  He is afraid to eat anything due to vomiting three times last night hours after eating.  He stated that he was hospitalized 2 days for dehydration and he stated that he still feels dehydrated.  His mouth is very dry and he does not feel well.  He was advised to go back to the ED for further assessment.   Reason for Disposition  [1] Drinking very little AND [2] dehydration suspected (e.g., no urine > 12 hours, very dry mouth, very lightheaded)  Answer Assessment - Initial Assessment Questions 1. ONSET: "When did the muscle aches or body pains start?"      Last night 2. LOCATION: "What part of your body is hurting?" (e.g., entire body, arms, legs)      Legs, feet, stomach, sides  3. SEVERITY: "How bad is the pain?" (Scale 1-10; or mild, moderate, severe)   - MILD (1-3): doesn't interfere with normal activities    - MODERATE (4-7): interferes with normal activities or awakens from sleep    - SEVERE (8-10):  excruciating pain, unable to do any normal activities      7-8/10  4. CAUSE: "What do you think is causing the pains?"     Dehydration  5. FEVER: "Have you been having fever?"     Has not  checked  6. OTHER SYMPTOMS: "Do you have any other symptoms?" (e.g., chest pain, weakness, rash, cold or flu symptoms, weight loss)     Abdominal pain, has not eaten because he's scared to eat, vomited three times small amount hours after eating  Protocols used: Muscle Aches and Body Pain-A-AH

## 2023-12-14 NOTE — ED Provider Notes (Signed)
[place] EMERGENCY DEPARTMENT AT [facility] Provider Note   CSN: [id number] Arrival date & time: 12/14/23  1506     History  Chief Complaint  Patient presents with   Abdominal Pain    Richard Lopez is a 48 y.o. male.  Patient complains of weakness and vomiting.  He was just in the hospital for an AKI.  The history is provided by the patient and medical records. No language interpreter was used.  Weakness Severity:  Moderate Onset quality:  Sudden Timing:  Constant Progression:  Worsening Chronicity:  Recurrent Context: not alcohol use   Relieved by:  Nothing Worsened by:  Nothing Ineffective treatments:  None tried Associated symptoms: no abdominal pain, no chest pain, no cough, no diarrhea, no frequency, no headaches and no seizures        Home Medications Prior to Admission medications   Medication Sig Start Date End Date Taking? Authorizing Provider  amLODipine (NORVASC) 10 MG tablet Take 1 tablet (10 mg total) by mouth daily. 08/27/23 12/11/23  Rai, Delene Ruffini, MD  feeding supplement (ENSURE ENLIVE / ENSURE PLUS) LIQD Take 237 mLs by mouth 3 (three) times daily between meals. Patient taking differently: Take 237 mLs by mouth See admin instructions. Drink 237 ml's by mouth one to two times a day between meals 08/20/23   Rolly Salter, MD  mirtazapine (REMERON) 7.5 MG tablet Take 1 tablet (7.5 mg total) by mouth at bedtime. For anxiety and to help sleep 12/13/23   Pieter Partridge, MD  Multiple Vitamin (MULTIVITAMIN WITH MINERALS) TABS tablet Take 1 tablet by mouth daily. 11/16/22   Marguerita Merles Latif, DO  pantoprazole (PROTONIX) 40 MG tablet Take 1 tablet (40 mg total) by mouth 2 (two) times daily before a meal. Patient taking differently: Take 40 mg by mouth See admin instructions. Take 40 mg by mouth one to two times a day 30 minutes prior to a meal 08/27/23   Rai, Ripudeep K, MD  prazosin (MINIPRESS) 1 MG capsule Take 1 capsule (1 mg total) by  mouth at bedtime. To prevent nightmares 12/13/23   Pieter Partridge, MD  sertraline (ZOLOFT) 50 MG tablet Take 1 tablet (50 mg total) by mouth daily. For anxiety and depression 12/14/23   Pieter Partridge, MD  TYLENOL 8 HOUR ARTHRITIS PAIN 650 MG CR tablet Take 1,300 mg by mouth every 8 (eight) hours as needed (for arthritic pain).    [provider]      Allergies    Ativan [lorazepam] and Nsaids    Review of Systems   Review of Systems  Constitutional:  Negative for appetite change and fatigue.  HENT:  Negative for congestion, ear discharge and sinus pressure.   Eyes:  Negative for discharge.  Respiratory:  Negative for cough.   Cardiovascular:  Negative for chest pain.  Gastrointestinal:  Negative for abdominal pain and diarrhea.  Genitourinary:  Negative for frequency and hematuria.  Musculoskeletal:  Negative for back pain.  Skin:  Negative for rash.  Neurological:  Positive for weakness. Negative for seizures and headaches.  Psychiatric/Behavioral:  Negative for hallucinations.     Physical Exam Updated Vital Signs BP (!) 152/110   Pulse 92   Temp 99 F (37.2 C) (Oral)   Resp (!) 21   Ht 6\' 6"  (1.981 m)   Wt 96.2 kg   SpO2 100%   BMI 24.50 kg/m  Physical Exam Vitals and nursing note reviewed.  Constitutional:  Appearance: He is well-developed.  HENT:     Head: Normocephalic.     Nose: Nose normal.  Eyes:     General: No scleral icterus.    Conjunctiva/sclera: Conjunctivae normal.  Neck:     Thyroid: No thyromegaly.  Cardiovascular:     Rate and Rhythm: Normal rate and regular rhythm.     Heart sounds: No murmur heard.    No friction rub. No gallop.  Pulmonary:     Breath sounds: No stridor. No wheezing or rales.  Chest:     Chest wall: No tenderness.  Abdominal:     General: There is no distension.     Tenderness: There is no abdominal tenderness. There is no rebound.  Musculoskeletal:        General: Normal range of motion.      Cervical back: Neck supple.  Lymphadenopathy:     Cervical: No cervical adenopathy.  Skin:    Findings: No erythema or rash.  Neurological:     Mental Status: He is alert and oriented to person, place, and time.     Motor: No abnormal muscle tone.     Coordination: Coordination normal.  Psychiatric:        Behavior: Behavior normal.     ED Results / Procedures / Treatments   Labs (all labs ordered are listed, but only abnormal results are displayed) Labs Reviewed  COMPREHENSIVE METABOLIC PANEL - Abnormal; Notable for the following components:      Result Value   Sodium 128 (*)    Chloride 88 (*)    Glucose, Bld 181 (*)    Creatinine, Ser 2.18 (*)    Calcium 10.6 (*)    Total Protein 9.8 (*)    Albumin 5.2 (*)    AST 60 (*)    ALT 46 (*)    GFR, Estimated 37 (*)    Anion gap 18 (*)    All other components within normal limits  CBC WITH DIFFERENTIAL/PLATELET - Abnormal; Notable for the following components:   Monocytes Absolute 1.2 (*)    All other components within normal limits  CBG MONITORING, ED - Abnormal; Notable for the following components:   Glucose-Capillary 181 (*)    All other components within normal limits  LIPASE, BLOOD  URINALYSIS, ROUTINE W REFLEX MICROSCOPIC  ETHANOL    EKG None  Radiology No results found.  Procedures Procedures    Medications Ordered in ED Medications  ondansetron (ZOFRAN) injection 4 mg (4 mg Intravenous Given 12/14/23 1629)  sodium chloride 0.9 % bolus 1,000 mL (1,000 mLs Intravenous New Bag/Given 12/14/23 2056)    ED Course/ Medical Decision Making/ A&P                                 Medical Decision Making Amount and/or Complexity of Data Reviewed Labs: ordered.  Risk Prescription drug management. Decision regarding hospitalization.   Patient with AKI.  He will be hydrated and admitted to the hospitalist        Final Clinical Impression(s) / ED Diagnoses Final diagnoses:  AKI (acute kidney  injury) [facility])    Rx / DC Orders ED Discharge Orders     None         Bethann Berkshire, MD 12/16/23 1427

## 2023-12-15 DIAGNOSIS — R112 Nausea with vomiting, unspecified: Secondary | ICD-10-CM | POA: Diagnosis not present

## 2023-12-15 DIAGNOSIS — N179 Acute kidney failure, unspecified: Secondary | ICD-10-CM | POA: Diagnosis not present

## 2023-12-15 DIAGNOSIS — E871 Hypo-osmolality and hyponatremia: Secondary | ICD-10-CM | POA: Diagnosis not present

## 2023-12-15 LAB — COMPREHENSIVE METABOLIC PANEL
ALT: 39 U/L (ref 0–44)
AST: 50 U/L — ABNORMAL HIGH (ref 15–41)
Albumin: 3.9 g/dL (ref 3.5–5.0)
Alkaline Phosphatase: 54 U/L (ref 38–126)
Anion gap: 14 (ref 5–15)
BUN: 19 mg/dL (ref 6–20)
CO2: 23 mmol/L (ref 22–32)
Calcium: 9.1 mg/dL (ref 8.9–10.3)
Chloride: 91 mmol/L — ABNORMAL LOW (ref 98–111)
Creatinine, Ser: 1.85 mg/dL — ABNORMAL HIGH (ref 0.61–1.24)
GFR, Estimated: 45 mL/min — ABNORMAL LOW (ref >60)
Glucose, Bld: 144 mg/dL — ABNORMAL HIGH (ref 70–99)
Potassium: 3.4 mmol/L — ABNORMAL LOW (ref 3.5–5.1)
Sodium: 128 mmol/L — ABNORMAL LOW (ref 135–145)
Total Bilirubin: 0.7 mg/dL (ref 0.0–1.2)
Total Protein: 7.8 g/dL (ref 6.5–8.1)

## 2023-12-15 LAB — CBC
HCT: 35.7 % — ABNORMAL LOW (ref 39.0–52.0)
Hemoglobin: 11.8 g/dL — ABNORMAL LOW (ref 13.0–17.0)
MCH: 29.1 pg (ref 26.0–34.0)
MCHC: 33.1 g/dL (ref 30.0–36.0)
MCV: 88.1 fL (ref 80.0–100.0)
Platelets: 273 10*3/uL (ref 150–400)
RBC: 4.05 MIL/uL — ABNORMAL LOW (ref 4.22–5.81)
RDW: 13.6 % (ref 11.5–15.5)
WBC: 6.6 10*3/uL (ref 4.0–10.5)
nRBC: 0 % (ref 0.0–0.2)

## 2023-12-15 LAB — VOLATILES,BLD-ACETONE,ETHANOL,ISOPROP,METHANOL
Acetone, blood: <0.01 g/dL (ref 0.000–0.010)
Ethanol, blood: <0.01 g/dL (ref 0.000–0.010)
Isopropanol, blood: <0.01 g/dL (ref 0.000–0.010)
Methanol, blood: <0.01 g/dL (ref 0.000–0.010)

## 2023-12-15 LAB — GLUCOSE, CAPILLARY
Glucose-Capillary: 165 mg/dL — ABNORMAL HIGH (ref 70–99)
Glucose-Capillary: 81 mg/dL (ref 70–99)
Glucose-Capillary: 158 mg/dL — ABNORMAL HIGH (ref 70–99)
Glucose-Capillary: 109 mg/dL — ABNORMAL HIGH (ref 70–99)

## 2023-12-15 NOTE — TOC Initial Note (Signed)
Transition of Care [facility]) - Initial/Assessment Note    Patient Details  Name: Richard Lopez MRN: [id number] Date of Birth: 11/27/1975  Transition of Care [facility]) CM/SW Contact:    Larrie Kass, LCSW Phone Number: 12/15/2023, 2:36 PM  Clinical Narrative:                  The pt was recently discharged on 2/9. Pt has a PCP and was given SDOH resources on 2/9. Pt can receive a bus pass upon discharge if transportation assistance is needed. TOC to follow for discharge needs.  Expected Discharge Plan: Home/Self Care Barriers to Discharge: Continued Medical Work up   Patient Goals and CMS Choice            Expected Discharge Plan and Services                                              Prior Living Arrangements/Services                       Activities of Daily Living   ADL Screening (condition at time of admission) Independently performs ADLs?: Yes (appropriate for developmental age) Is the patient deaf or have difficulty hearing?: No Does the patient have difficulty seeing, even when wearing glasses/contacts?: No Does the patient have difficulty concentrating, remembering, or making decisions?: No  Permission Sought/Granted                  Emotional Assessment              Admission diagnosis:  AKI (acute kidney injury) (HCC) [N17.9] Patient Active Problem List   Diagnosis Date Noted   Delirium, withdrawal, alcoholic (HCC) 08/19/2023   Esophagitis 08/18/2023   Cholelithiasis 08/18/2023   Abdominal pain 08/18/2023   Dehydration with hyponatremia 08/17/2023   Depression 06/17/2023   Abnormal LFTs 04/20/2023   Hypercalcemia 04/20/2023   Hypocalcemia 11/09/2022   Acute renal failure (HCC) 11/09/2022   Elevated CPK 11/08/2022   Elevated LFTs 11/08/2022   Intractable nausea and vomiting 02/12/2021   AF (paroxysmal atrial fibrillation) (HCC)    Transaminitis    Dehydration    AKI (acute kidney injury) (HCC) 06/18/2020    Atrial fibrillation with RVR (HCC) 04/22/2020   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   Hyponatremia 04/21/2020   Aspiration pneumonia (HCC) 04/21/2020   Esophagitis determined by endoscopy 08/21/2019   Anemia 08/21/2019   Hypokalemia 08/17/2019   Hypomagnesemia 08/17/2019   Hemoptysis 08/17/2019   Acute upper GI bleed 08/14/2019   Hematemesis 08/13/2019   Cavitary lesion of lung 08/01/2019   Gout 05/18/2019   Peri-prosthetic fracture of femur at tip of prosthesis 05/16/2019   Alcohol use 05/16/2019   Intertrochanteric fracture of right hip (HCC) 04/14/2019   Vitamin D deficiency 04/14/2019   DTs (delirium tremens) (HCC) 04/07/2019   Delirium tremens (HCC) 04/07/2019   Encephalopathy acute    Closed right hip fracture, initial encounter (HCC) 03/25/2019   Alcohol dependence with intoxication (HCC) 03/25/2019   Hypertensive urgency 03/25/2019   Thrombocytopenia (HCC) 03/25/2019   Effusion, right knee 03/25/2019   Closed fracture of right femur (HCC)    High anion gap metabolic acidosis    Prediabetes 04/02/2017   Gastritis 04/02/2017   Marijuana abuse 04/01/2017   Nausea with vomiting 07/29/2014   Type 2 diabetes mellitus with hyperlipidemia (HCC)  07/29/2014   Essential hypertension 07/29/2014   Nausea & vomiting 07/29/2014   PCP:  [name], NP Pharmacy:   [facility] [address] [place]), [place] - [address] [place] [address] Phone: [phone] Fax: [phone]     Social Drivers of Health (SDOH) Social History: SDOH Screenings   Food Insecurity: Food Insecurity Present (12/15/2023)  Housing: Low Risk  (12/15/2023)  Transportation Needs: No Transportation Needs (12/15/2023)  Utilities: Not At Risk (12/15/2023)  Recent Concern: Utilities - At Risk (09/16/2023)  Alcohol Screen: Low Risk  (09/16/2023)  Depression (PHQ2-9): High Risk (09/16/2023)  Financial Resource Strain: Medium Risk (09/16/2023)  Physical Activity:  Unknown (11/07/2018)  Social Connections: Moderately Isolated (09/16/2023)  Stress: Stress Concern Present (09/16/2023)  Tobacco Use: Medium Risk (12/14/2023)  Health Literacy: Inadequate Health Literacy (09/16/2023)   SDOH Interventions:     Readmission Risk Interventions    08/25/2023   12:27 PM 04/21/2023    2:37 PM  Readmission Risk Prevention Plan  Transportation Screening Complete Complete  PCP or Specialist Appt within 3-5 Days Complete Complete  HRI or Home Care Consult Complete Complete  Social Work Consult for Recovery Care Planning/Counseling Complete Complete  Palliative Care Screening Not Applicable Not Applicable  Medication Review Oceanographer) Complete Complete

## 2023-12-15 NOTE — Plan of Care (Signed)

## 2023-12-15 NOTE — Progress Notes (Signed)
Progress Note   Patient: Richard Lopez GEX:[id number] DOB: 16-Jun-1976 DOA: 12/14/2023     1 DOS: the patient was seen and examined on 12/15/2023   Brief hospital course: The patient is a 48 yr old man who presented to [facility] ED on 12/14/2023 with complaints of recurrent severe nausea and vomiting. The patient had just been discharged to home following a short admission for the sane indication that led to an AKI. He was aggressively rehydrated. He was discharged to home on with a creatinine of 0.9. He states that he vomited twice his creatinine in the ED was 2.18. He had an anion gap of 18. LFT's were also elevated. His CBC was normal.   The patient had a medical history significant for DM, PE, anxiety disorder, hypertension, gout, depression, esophagitis, history of substance use disorder and alcohol use disorder.   Assessment and Plan: recurrent AKI: Appears to be prerenal.  Patient has had some nausea with vomiting.  Patient just got discharged yesterday.  Is not clear what happened that patient will come back within 24 hours with the same symptoms.  May have taken some medications that should not be taken.  Will get drug screen.  Check alcohol level.  Worrisome about the use of Methanol and ethenyl Alcohol.  Especially with the anion gap.  Continue to hydrate.  hyponatremia   essential hypertension: Patient is on amlodipine from home which we will continue with.  Continue   gout: Continue home regimen   acute gastritis and esophagitis: Will continue PPIs   anxiety with depression: Was seen by psychiatry the last time.  New medications started this include mirtazapine, Zoloft.   type 2 diabetes: Has been diet controlled.  Sliding scale insulin. Will check a beta hydroxybutyrate.      Advance Care Planning:   Code Status: Full Code    Consults: None   Family Communication: No family bedside       Subjective: The patient appears acutely ill. He is awake, alert, and oriented x  3.  Physical Exam: Vitals:   12/15/23 0346 12/15/23 0500 12/15/23 0830 12/15/23 1547  BP: 131/69  (!) 154/91 (!) 139/94  Pulse: 92  (!) 103 72  Resp: 16  18 18   Temp: 97.9 F (36.6 C)  98.2 F (36.8 C) 97.9 F (36.6 C)  TempSrc:    Oral  SpO2: 98%  98% 100%  Weight:  100 kg    Height:       Exam:  Constitutional:  The patient is awake, alert, and oriented x 3. No acute distress. Respiratory:  No increased work of breathing. No wheezes, rales, or rhonchi No tactile fremitus Cardiovascular:  Regular rate and rhythm No murmurs, ectopy, or gallups. No lateral PMI. No thrills. Abdomen:  Abdomen is soft, non-tender, non-distended No hernias, masses, or organomegaly Normoactive bowel sounds.  Musculoskeletal:  No cyanosis, clubbing, or edema Skin:  No rashes, lesions, ulcers palpation of skin: no induration or nodules Neurologic:  CN 2-12 intact Sensation all 4 extremities intact Psychiatric:  Mental status Mood, affect appropriate Orientation to person, place, time  judgment and insight appear intact  Data Reviewed:  CBC BMP  Family Communication: None available  Disposition: Status is: Inpatient Remains inpatient appropriate because: Pt has metabolic acidosis of unknown source. Pt requires close monitoring of his electrolytes and IV hydration.   Planned Discharge Destination: Home    Time spent: 34 minutes  Author: Aarna Mihalko, DO 12/15/2023 8:09 PM  For on call review [email or web address].

## 2023-12-16 ENCOUNTER — Encounter (HOSPITAL_COMMUNITY): Payer: Self-pay | Admitting: Internal Medicine

## 2023-12-16 DIAGNOSIS — N179 Acute kidney failure, unspecified: Secondary | ICD-10-CM | POA: Diagnosis not present

## 2023-12-16 DIAGNOSIS — F1210 Cannabis abuse, uncomplicated: Secondary | ICD-10-CM

## 2023-12-16 DIAGNOSIS — R112 Nausea with vomiting, unspecified: Secondary | ICD-10-CM | POA: Diagnosis not present

## 2023-12-16 DIAGNOSIS — E876 Hypokalemia: Secondary | ICD-10-CM

## 2023-12-16 LAB — CBC WITH DIFFERENTIAL/PLATELET
Abs Immature Granulocytes: 0.01 10*3/uL (ref 0.00–0.07)
Basophils Absolute: 0 10*3/uL (ref 0.0–0.1)
Basophils Relative: 1 %
Eosinophils Absolute: 0.1 10*3/uL (ref 0.0–0.5)
Eosinophils Relative: 2 %
HCT: 35.1 % — ABNORMAL LOW (ref 39.0–52.0)
Hemoglobin: 11.9 g/dL — ABNORMAL LOW (ref 13.0–17.0)
Immature Granulocytes: 0 %
Lymphocytes Relative: 57 %
Lymphs Abs: 2.6 10*3/uL (ref 0.7–4.0)
MCH: 30.1 pg (ref 26.0–34.0)
MCHC: 33.9 g/dL (ref 30.0–36.0)
MCV: 88.6 fL (ref 80.0–100.0)
Monocytes Absolute: 0.6 10*3/uL (ref 0.1–1.0)
Monocytes Relative: 14 %
Neutro Abs: 1.2 10*3/uL — ABNORMAL LOW (ref 1.7–7.7)
Neutrophils Relative %: 26 %
Platelets: 282 10*3/uL (ref 150–400)
RBC: 3.96 MIL/uL — ABNORMAL LOW (ref 4.22–5.81)
RDW: 13.6 % (ref 11.5–15.5)
WBC: 4.5 10*3/uL (ref 4.0–10.5)
nRBC: 0 % (ref 0.0–0.2)

## 2023-12-16 LAB — RAPID URINE DRUG SCREEN, HOSP PERFORMED
Amphetamines: NOT DETECTED
Barbiturates: NOT DETECTED
Benzodiazepines: NOT DETECTED
Cocaine: NOT DETECTED
Opiates: NOT DETECTED
Tetrahydrocannabinol: POSITIVE — AB

## 2023-12-16 LAB — BASIC METABOLIC PANEL
Anion gap: 13 (ref 5–15)
BUN: 13 mg/dL (ref 6–20)
CO2: 22 mmol/L (ref 22–32)
Calcium: 8.6 mg/dL — ABNORMAL LOW (ref 8.9–10.3)
Chloride: 96 mmol/L — ABNORMAL LOW (ref 98–111)
Creatinine, Ser: 0.98 mg/dL (ref 0.61–1.24)
GFR, Estimated: >60 mL/min (ref >60)
Glucose, Bld: 136 mg/dL — ABNORMAL HIGH (ref 70–99)
Potassium: 3.4 mmol/L — ABNORMAL LOW (ref 3.5–5.1)
Sodium: 131 mmol/L — ABNORMAL LOW (ref 135–145)

## 2023-12-16 LAB — HEPATIC FUNCTION PANEL
ALT: 43 U/L (ref 0–44)
AST: 60 U/L — ABNORMAL HIGH (ref 15–41)
Albumin: 3.5 g/dL (ref 3.5–5.0)
Alkaline Phosphatase: 54 U/L (ref 38–126)
Bilirubin, Direct: 0.1 mg/dL (ref 0.0–0.2)
Indirect Bilirubin: 0.6 mg/dL (ref 0.3–0.9)
Total Bilirubin: 0.7 mg/dL (ref 0.0–1.2)
Total Protein: 7.1 g/dL (ref 6.5–8.1)

## 2023-12-16 LAB — GLUCOSE, CAPILLARY
Glucose-Capillary: 123 mg/dL — ABNORMAL HIGH (ref 70–99)
Glucose-Capillary: 193 mg/dL — ABNORMAL HIGH (ref 70–99)

## 2023-12-16 MED ORDER — OXYCODONE HCL 5 MG PO TABS
5.0000 mg | ORAL_TABLET | Freq: Once | ORAL | Status: AC | PRN
  Administered 2023-12-16: 5 mg via ORAL
  Filled 2023-12-16: qty 1

## 2023-12-16 MED ORDER — POTASSIUM CHLORIDE CRYS ER 20 MEQ PO TBCR
40.0000 meq | EXTENDED_RELEASE_TABLET | Freq: Once | ORAL | Status: AC
  Administered 2023-12-16: 40 meq via ORAL
  Filled 2023-12-16: qty 2

## 2023-12-16 MED ORDER — ONDANSETRON HCL 4 MG PO TABS: 4.0000 mg | ORAL_TABLET | Freq: Four times a day (QID) | ORAL | 0 refills | Status: AC | PRN

## 2023-12-16 MED ORDER — AMLODIPINE BESYLATE 10 MG PO TABS: 10.0000 mg | ORAL_TABLET | Freq: Every day | ORAL | 0 refills | Status: DC

## 2023-12-16 NOTE — Progress Notes (Signed)
PROGRESS NOTE    Richard Lopez  HCW:[id number] DOB: 08-26-1976 DOA: 12/14/2023 PCP: [name], NP  Subjective: Pt seen and examined. No further vomiting this AM. Doesn't like the food at the hospital. BUN 13, Scr 0.98 this AM.   UDS positive for THC. UDS repeatedly positive for [facility] over the last 5 years   Hospital Course: HPI: Richard Lopez is a 48 y.o. male with medical history significant of diabetes, previous history of pulmonary embolism, anxiety disorder, essential hypertension, history of gout, depression, history of esophagitis, history of alcohol abuse and substance use disorder who was just discharged yesterday after admission with AKI.  Patient has had nausea vomiting recently.  He was having that leading to AKI.  Was admitted.  Aggressively hydrated.  Creatinine dropped to 0.9 and was discharged home yesterday.  Patient returned today with complaint of another bouts of nausea vomiting.  He vomited at least twice.  Sodium was found to be 128 chloride 88 glucose 181.  Creatinine went to 2.18 from 0.85.  Patient has a gap of 18.  LFTs also notably elevated with AST of 60 ALT of 46.  CBC within normal.  Patient has been readmitted with AKI.  He does have history of alcohol abuse and not sure if he drank alcohol.   Significant Events: Admitted 12/14/2023 AKI   Significant Labs: WBC 6.5, HgB 13.6, plt 325 Na 128, K 4.3, BUN 16, Scr 2.1, Ca 10.6, TP 9.8, alb 5.2  Significant Imaging Studies:   Antibiotic Therapy: Anti-infectives (From admission, onward)    None       Procedures:   Consultants:    Assessment and Plan: * AKI (acute kidney injury) (HCC) Pt hydrated with Ivf. AKI resolved. BUN/Scr 13 and 0.98 today.  Nausea with vomiting - Could be related to cannabis hyperemesis syndrome given pt's repeated use of marijuana. Could be related to cannabis hyperemesis syndrome given pt's repeated use of marijuana.  Depression Continue zoloft that was suggested  by psych consult on 12-12-2023.  Hypercalcemia Resolved with IVF.  AF (paroxysmal atrial fibrillation) (HCC) Stable.  Hyponatremia Improved from yesterday with IVF. Na 131 today.  Gout Multiple tophaceous gout of hands/fingers bilaterally  Marijuana abuse Chronic.  Essential hypertension Stable.  Type 2 diabetes mellitus with hyperlipidemia (HCC) Stable.       DVT prophylaxis: heparin injection 5,000 Units Start: 12/15/23 0600    Code Status: Full Code Family Communication: no family at bedside. Pt is decisional. Disposition Plan: return home Reason for continuing need for hospitalization: medically stable.  Objective: Vitals:   12/15/23 2023 12/15/23 2108 12/16/23 0342 12/16/23 0500  BP: (!) 166/87 (!) 166/87 136/88   Pulse: 76  68   Resp: 20  16   Temp: 99 F (37.2 C)  98 F (36.7 C)   TempSrc:      SpO2: 100%  100%   Weight:    103.6 kg  Height:        Intake/Output Summary (Last 24 hours) at 12/16/2023 1236 Last data filed at 12/16/2023 0500 Gross per 24 hour  Intake 2807.5 ml  Output 875 ml  Net 1932.5 ml   Filed Weights   12/14/23 2343 12/15/23 0500 12/16/23 0500  Weight: 97.8 kg 100 kg 103.6 kg    Examination:  Physical Exam Vitals and nursing note reviewed.  Constitutional:      General: He is not in acute distress.    Appearance: He is not toxic-appearing or diaphoretic.  HENT:  Head: Normocephalic and atraumatic.  Eyes:     General: No scleral icterus. Cardiovascular:     Rate and Rhythm: Normal rate and regular rhythm.  Pulmonary:     Effort: Pulmonary effort is normal. No respiratory distress.     Breath sounds: No wheezing.  Abdominal:     General: Bowel sounds are normal. There is no distension.     Palpations: Abdomen is soft.  Musculoskeletal:     Comments: Bilateral hands/finger with tophaceous gout.  Skin:    General: Skin is warm and dry.     Capillary Refill: Capillary refill takes less than 2 seconds.   Neurological:     General: No focal deficit present.     Mental Status: He is alert and oriented to person, place, and time.     Data Reviewed: I have personally reviewed following labs and imaging studies  CBC: Recent Labs  Lab 12/12/23 0440 12/13/23 0350 12/14/23 1620 12/15/23 0455 12/16/23 0433  WBC 4.4 4.1 6.5 6.6 4.5  NEUTROABS  --   --  3.5  --  1.2*  HGB 10.9* 11.2* 13.6 11.8* 11.9*  HCT 31.8* 33.1* 39.3 35.7* 35.1*  MCV 86.2 88.7 86.6 88.1 88.6  PLT 146* 167 325 273 282   Basic Metabolic Panel: Recent Labs  Lab 12/11/23 1317 12/12/23 0440 12/12/23 1713 12/13/23 0350 12/14/23 1620 12/15/23 0455 12/16/23 0433  NA 127* 129* 131* 135 128* 128* 131*  K 4.3 2.8* 3.9 3.4* 4.3 3.4* 3.4*  CL 83* 90* 91* 98 88* 91* 96*  CO2 23 25 27 25 22 23 22   GLUCOSE 155* 109* 156* 110* 181* 144* 136*  BUN 22* 20 15 12 16 19 13   CREATININE 2.38* 1.38* 0.92 0.85 2.18* 1.85* 0.98  CALCIUM 9.5 8.6* 9.0 8.9 10.6* 9.1 8.6*  MG 1.6* 2.1  --   --   --   --   --   PHOS 4.6  --   --   --   --   --   --    GFR: Estimated Creatinine Clearance: 121.8 mL/min (by C-G formula based on SCr of 0.98 mg/dL). Liver Function Tests: Recent Labs  Lab 12/11/23 1317 12/14/23 1620 12/15/23 0455 12/16/23 0433  AST 64* 60* 50* 60*  ALT 34 46* 39 43  ALKPHOS 64 66 54 54  BILITOT 1.1 0.8 0.7 0.7  PROT 9.5* 9.8* 7.8 7.1  ALBUMIN 4.8 5.2* 3.9 3.5   Recent Labs  Lab 12/11/23 1317 12/14/23 1620  LIPASE 34 36   CBG: Recent Labs  Lab 12/15/23 1058 12/15/23 1658 12/15/23 2021 12/16/23 0728 12/16/23 1159  GLUCAP 81 158* 109* 123* 193*    Recent Results (from the past 240 hours)  Resp panel by RT-PCR (RSV, Flu A&B, Covid) Anterior Nasal Swab     Status: None   Collection Time: 12/11/23  4:12 PM   Specimen: Anterior Nasal Swab  Result Value Ref Range Status   SARS Coronavirus 2 by RT PCR NEGATIVE NEGATIVE Final    Comment: (NOTE) SARS-CoV-2 target nucleic acids are NOT DETECTED.  The  SARS-CoV-2 RNA is generally detectable in upper respiratory specimens during the acute phase of infection. The lowest concentration of SARS-CoV-2 viral copies this assay can detect is 138 copies/mL. A negative result does not preclude SARS-Cov-2 infection and should not be used as the sole basis for treatment or other patient management decisions. A negative result may occur with  improper specimen collection/handling, submission of specimen other than nasopharyngeal swab,  presence of viral mutation(s) within the areas targeted by this assay, and inadequate number of viral copies(<138 copies/mL). A negative result must be combined with clinical observations, patient history, and epidemiological information. The expected result is Negative.  Fact Sheet for Patients:  BloggerCourse.com  Fact Sheet for Healthcare Providers:  SeriousBroker.it  This test is no t yet approved or cleared by the [place] FDA and  has been authorized for detection and/or diagnosis of SARS-CoV-2 by FDA under an Emergency Use Authorization (EUA). This EUA will remain  in effect (meaning this test can be used) for the duration of the COVID-19 declaration under Section 564(b)(1) of the Act, 21 U.S.C.section 360bbb-3(b)(1), unless the authorization is terminated  or revoked sooner.       Influenza A by PCR NEGATIVE NEGATIVE Final   Influenza B by PCR NEGATIVE NEGATIVE Final    Comment: (NOTE) The Xpert Xpress SARS-CoV-2/FLU/RSV plus assay is intended as an aid in the diagnosis of influenza from Nasopharyngeal swab specimens and should not be used as a sole basis for treatment. Nasal washings and aspirates are unacceptable for Xpert Xpress SARS-CoV-2/FLU/RSV testing.  Fact Sheet for Patients: BloggerCourse.com  Fact Sheet for Healthcare Providers: SeriousBroker.it  This test is not yet approved or  cleared by the [place] FDA and has been authorized for detection and/or diagnosis of SARS-CoV-2 by FDA under an Emergency Use Authorization (EUA). This EUA will remain in effect (meaning this test can be used) for the duration of the COVID-19 declaration under Section 564(b)(1) of the Act, 21 U.S.C. section 360bbb-3(b)(1), unless the authorization is terminated or revoked.     Resp Syncytial Virus by PCR NEGATIVE NEGATIVE Final    Comment: (NOTE) Fact Sheet for Patients: BloggerCourse.com  Fact Sheet for Healthcare Providers: SeriousBroker.it  This test is not yet approved or cleared by the [place] FDA and has been authorized for detection and/or diagnosis of SARS-CoV-2 by FDA under an Emergency Use Authorization (EUA). This EUA will remain in effect (meaning this test can be used) for the duration of the COVID-19 declaration under Section 564(b)(1) of the Act, 21 U.S.C. section 360bbb-3(b)(1), unless the authorization is terminated or revoked.  Performed at [facility], [address], [place], [place] [address]      Radiology Studies: No results found.  Scheduled Meds:  heparin  5,000 Units Subcutaneous Q8H   insulin aspart  0-15 Units Subcutaneous TID WC   insulin aspart  0-5 Units Subcutaneous QHS   mirtazapine  7.5 mg Oral QHS   pantoprazole  40 mg Oral BID AC   prazosin  1 mg Oral QHS   sertraline  50 mg Oral Daily   Continuous Infusions:   LOS: 2 days   Time spent: 40 minutes  Carollee Herter, DO  [facility]  12/16/2023, 12:36 PM

## 2023-12-16 NOTE — Assessment & Plan Note (Signed)
Stable

## 2023-12-16 NOTE — Assessment & Plan Note (Signed)
Give 40 meq kcl prior to discharge.

## 2023-12-16 NOTE — Assessment & Plan Note (Addendum)
Could be related to cannabis hyperemesis syndrome given pt's repeated use of marijuana. DC to home with zofran. Advise to stop using marijuana.

## 2023-12-16 NOTE — Assessment & Plan Note (Signed)
Pt hydrated with Ivf. AKI resolved. BUN/Scr 13 and 0.98 today.

## 2023-12-16 NOTE — Assessment & Plan Note (Signed)
Chronic.

## 2023-12-16 NOTE — Assessment & Plan Note (Signed)
Improved from yesterday with IVF. Na 131 today.

## 2023-12-16 NOTE — Hospital Course (Addendum)
HPI: Richard Lopez is a 48 y.o. male with medical history significant of diabetes, previous history of pulmonary embolism, anxiety disorder, essential hypertension, history of gout, depression, history of esophagitis, history of alcohol abuse and substance use disorder who was just discharged yesterday after admission with AKI.  Patient has had nausea vomiting recently.  He was having that leading to AKI.  Was admitted.  Aggressively hydrated.  Creatinine dropped to 0.9 and was discharged home yesterday.  Patient returned today with complaint of another bouts of nausea vomiting.  He vomited at least twice.  Sodium was found to be 128 chloride 88 glucose 181.  Creatinine went to 2.18 from 0.85.  Patient has a gap of 18.  LFTs also notably elevated with AST of 60 ALT of 46.  CBC within normal.  Patient has been readmitted with AKI.  He does have history of alcohol abuse and not sure if he drank alcohol.   Significant Events: Admitted 12/14/2023 AKI   Significant Labs: WBC 6.5, HgB 13.6, plt 325 Na 128, K 4.3, BUN 16, Scr 2.1, Ca 10.6, TP 9.8, alb 5.2  Significant Imaging Studies:   Antibiotic Therapy: Anti-infectives (From admission, onward)    None       Procedures:   Consultants:

## 2023-12-16 NOTE — Subjective & Objective (Signed)
Pt seen and examined. No further vomiting this AM. Doesn't like the food at the hospital. BUN 13, Scr 0.98 this AM.   UDS positive for THC. UDS repeatedly positive for [facility] over the last 5 years

## 2023-12-16 NOTE — Discharge Summary (Signed)
[facility] Physician Discharge Summary   Patient name: Richard Lopez  Admit date:     12/14/2023  Discharge date: 12/16/2023  Attending Physician: Rometta Emery [[id number]]  Discharge Physician: Carollee Herter   PCP: [name], NP  Admitted From: Home  Disposition:  Home  Recommendations for Outpatient Follow-up:  Follow up with PCP in 1-2 weeks Stop using marijuana and all products that contain THC  Home Health:No Equipment/Devices: None  Discharge Condition:Stable CODE STATUS:FULL Diet recommendation: Diabetic Fluid Restriction: None  Hospital Summary: HPI: Richard Lopez is a 48 y.o. male with medical history significant of diabetes, previous history of pulmonary embolism, anxiety disorder, essential hypertension, history of gout, depression, history of esophagitis, history of alcohol abuse and substance use disorder who was just discharged yesterday after admission with AKI.  Patient has had nausea vomiting recently.  He was having that leading to AKI.  Was admitted.  Aggressively hydrated.  Creatinine dropped to 0.9 and was discharged home yesterday.  Patient returned today with complaint of another bouts of nausea vomiting.  He vomited at least twice.  Sodium was found to be 128 chloride 88 glucose 181.  Creatinine went to 2.18 from 0.85.  Patient has a gap of 18.  LFTs also notably elevated with AST of 60 ALT of 46.  CBC within normal.  Patient has been readmitted with AKI.  He does have history of alcohol abuse and not sure if he drank alcohol.   Significant Events: Admitted 12/14/2023 AKI   Significant Labs: WBC 6.5, HgB 13.6, plt 325 Na 128, K 4.3, BUN 16, Scr 2.1, Ca 10.6, TP 9.8, alb 5.2  Significant Imaging Studies:   Antibiotic Therapy: Anti-infectives (From admission, onward)    None       Procedures:   Consultants:   Hospital Course by Problem: * AKI (acute kidney injury) (HCC) Pt hydrated with Ivf. AKI resolved. BUN/Scr 13 and 0.98  today.  Nausea with vomiting - Could be related to cannabis hyperemesis syndrome given pt's repeated use of marijuana. Could be related to cannabis hyperemesis syndrome given pt's repeated use of marijuana. DC to home with zofran. Advise to stop using marijuana.  Depression Continue zoloft that was suggested by psych consult on 12-12-2023.  Hypercalcemia Resolved with IVF.  AF (paroxysmal atrial fibrillation) (HCC) Stable.  Hyponatremia Improved from yesterday with IVF. Na 131 today.  Hypokalemia Give 40 meq kcl prior to discharge.  Gout Multiple tophaceous gout of hands/fingers bilaterally  Marijuana abuse Chronic.  Essential hypertension Stable.  Type 2 diabetes mellitus with hyperlipidemia (HCC) Stable.    Discharge Diagnoses:  Principal Problem:   AKI (acute kidney injury) (HCC) Active Problems:   Nausea with vomiting - Could be related to cannabis hyperemesis syndrome given pt's repeated use of marijuana.   Type 2 diabetes mellitus with hyperlipidemia (HCC)   Essential hypertension   Marijuana abuse   Gout   Hypokalemia   Hyponatremia   AF (paroxysmal atrial fibrillation) (HCC)   Hypercalcemia   Depression   Discharge Instructions  Discharge Instructions     Call MD for:  extreme fatigue   Complete by: As directed    Call MD for:  persistant dizziness or light-headedness   Complete by: As directed    Call MD for:  temperature >100.4   Complete by: As directed    Diet - low sodium heart healthy   Complete by: As directed    Diet Carb Modified   Complete by: As directed  Discharge instructions   Complete by: As directed    1. Follow up with primary care provider in 1-2 weeks following discharge. 2. Stop using marijuana and all products that contain THC. This could be the cause of your repeated bouts of nausea and vomiting.   Increase activity slowly   Complete by: As directed       Allergies as of 12/16/2023       Reactions   Ativan  [lorazepam] Anxiety, Other (See Comments)   Hallucinations   Nsaids Other (See Comments)   Hx of esophageal ulcer        Medication List     TAKE these medications    amLODipine 10 MG tablet Commonly known as: NORVASC Take 1 tablet (10 mg total) by mouth daily.   feeding supplement Liqd Take 237 mLs by mouth 3 (three) times daily between meals.   mirtazapine 7.5 MG tablet Commonly known as: REMERON Take 1 tablet (7.5 mg total) by mouth at bedtime. For anxiety and to help sleep   multivitamin with minerals Tabs tablet Take 1 tablet by mouth daily.   ondansetron 4 MG tablet Commonly known as: ZOFRAN Take 1 tablet (4 mg total) by mouth every 6 (six) hours as needed for nausea or vomiting.   pantoprazole 40 MG tablet Commonly known as: Protonix Take 1 tablet (40 mg total) by mouth 2 (two) times daily before a meal. What changed:  when to take this additional instructions   prazosin 1 MG capsule Commonly known as: MINIPRESS Take 1 capsule (1 mg total) by mouth at bedtime. To prevent nightmares   sertraline 50 MG tablet Commonly known as: ZOLOFT Take 1 tablet (50 mg total) by mouth daily. For anxiety and depression   Tylenol 8 Hour Arthritis Pain 650 MG CR tablet Generic drug: acetaminophen Take 1,300 mg by mouth every 8 (eight) hours as needed (for arthritic pain).        Allergies  Allergen Reactions   Ativan [Lorazepam] Anxiety and Other (See Comments)    Hallucinations   Nsaids Other (See Comments)    Hx of esophageal ulcer    Discharge Exam: Vitals:   12/15/23 2108 12/16/23 0342  BP: (!) 166/87 136/88  Pulse:  68  Resp:  16  Temp:  98 F (36.7 C)  SpO2:  100%    Physical Exam Vitals and nursing note reviewed.  Constitutional:      General: He is not in acute distress.    Appearance: He is not toxic-appearing or diaphoretic.  HENT:     Head: Normocephalic and atraumatic.  Eyes:     General: No scleral icterus. Cardiovascular:     Rate  and Rhythm: Normal rate and regular rhythm.  Pulmonary:     Effort: Pulmonary effort is normal. No respiratory distress.     Breath sounds: No wheezing.  Abdominal:     General: Bowel sounds are normal. There is no distension.     Palpations: Abdomen is soft.  Musculoskeletal:     Comments: Bilateral hands/finger with tophaceous gout.  Skin:    General: Skin is warm and dry.     Capillary Refill: Capillary refill takes less than 2 seconds.  Neurological:     General: No focal deficit present.     Mental Status: He is alert and oriented to person, place, and time.     The results of significant diagnostics from this hospitalization (including imaging, microbiology, ancillary and laboratory) are listed below for reference.  Microbiology: Recent Results (from the past 240 hours)  Resp panel by RT-PCR (RSV, Flu A&B, Covid) Anterior Nasal Swab     Status: None   Collection Time: 12/11/23  4:12 PM   Specimen: Anterior Nasal Swab  Result Value Ref Range Status   SARS Coronavirus 2 by RT PCR NEGATIVE NEGATIVE Final    Comment: (NOTE) SARS-CoV-2 target nucleic acids are NOT DETECTED.  The SARS-CoV-2 RNA is generally detectable in upper respiratory specimens during the acute phase of infection. The lowest concentration of SARS-CoV-2 viral copies this assay can detect is 138 copies/mL. A negative result does not preclude SARS-Cov-2 infection and should not be used as the sole basis for treatment or other patient management decisions. A negative result may occur with  improper specimen collection/handling, submission of specimen other than nasopharyngeal swab, presence of viral mutation(s) within the areas targeted by this assay, and inadequate number of viral copies(<138 copies/mL). A negative result must be combined with clinical observations, patient history, and epidemiological information. The expected result is Negative.  Fact Sheet for Patients:   BloggerCourse.com  Fact Sheet for Healthcare Providers:  SeriousBroker.it  This test is no t yet approved or cleared by the [place] FDA and  has been authorized for detection and/or diagnosis of SARS-CoV-2 by FDA under an Emergency Use Authorization (EUA). This EUA will remain  in effect (meaning this test can be used) for the duration of the COVID-19 declaration under Section 564(b)(1) of the Act, 21 U.S.C.section 360bbb-3(b)(1), unless the authorization is terminated  or revoked sooner.       Influenza A by PCR NEGATIVE NEGATIVE Final   Influenza B by PCR NEGATIVE NEGATIVE Final    Comment: (NOTE) The Xpert Xpress SARS-CoV-2/FLU/RSV plus assay is intended as an aid in the diagnosis of influenza from Nasopharyngeal swab specimens and should not be used as a sole basis for treatment. Nasal washings and aspirates are unacceptable for Xpert Xpress SARS-CoV-2/FLU/RSV testing.  Fact Sheet for Patients: BloggerCourse.com  Fact Sheet for Healthcare Providers: SeriousBroker.it  This test is not yet approved or cleared by the [place] FDA and has been authorized for detection and/or diagnosis of SARS-CoV-2 by FDA under an Emergency Use Authorization (EUA). This EUA will remain in effect (meaning this test can be used) for the duration of the COVID-19 declaration under Section 564(b)(1) of the Act, 21 U.S.C. section 360bbb-3(b)(1), unless the authorization is terminated or revoked.     Resp Syncytial Virus by PCR NEGATIVE NEGATIVE Final    Comment: (NOTE) Fact Sheet for Patients: BloggerCourse.com  Fact Sheet for Healthcare Providers: SeriousBroker.it  This test is not yet approved or cleared by the [place] FDA and has been authorized for detection and/or diagnosis of SARS-CoV-2 by FDA under an Emergency Use  Authorization (EUA). This EUA will remain in effect (meaning this test can be used) for the duration of the COVID-19 declaration under Section 564(b)(1) of the Act, 21 U.S.C. section 360bbb-3(b)(1), unless the authorization is terminated or revoked.  Performed at [facility], [address], [place], [place] [address]      Labs: Basic Metabolic Panel: Recent Labs  Lab 12/11/23 1317 12/12/23 0440 12/12/23 1713 12/13/23 0350 12/14/23 1620 12/15/23 0455 12/16/23 0433  NA 127* 129* 131* 135 128* 128* 131*  K 4.3 2.8* 3.9 3.4* 4.3 3.4* 3.4*  CL 83* 90* 91* 98 88* 91* 96*  CO2 23 25 27 25 22 23 22   GLUCOSE 155* 109* 156* 110* 181* 144* 136*  BUN 22* 20 15 12 16 19 13   CREATININE 2.38* 1.38* 0.92 0.85 2.18* 1.85* 0.98  CALCIUM 9.5 8.6* 9.0 8.9 10.6* 9.1 8.6*  MG 1.6* 2.1  --   --   --   --   --   PHOS 4.6  --   --   --   --   --   --    Liver Function Tests: Recent Labs  Lab 12/11/23 1317 12/14/23 1620 12/15/23 0455 12/16/23 0433  AST 64* 60* 50* 60*  ALT 34 46* 39 43  ALKPHOS 64 66 54 54  BILITOT 1.1 0.8 0.7 0.7  PROT 9.5* 9.8* 7.8 7.1  ALBUMIN 4.8 5.2* 3.9 3.5   Recent Labs  Lab 12/11/23 1317 12/14/23 1620  LIPASE 34 36   CBC: Recent Labs  Lab 12/12/23 0440 12/13/23 0350 12/14/23 1620 12/15/23 0455 12/16/23 0433  WBC 4.4 4.1 6.5 6.6 4.5  NEUTROABS  --   --  3.5  --  1.2*  HGB 10.9* 11.2* 13.6 11.8* 11.9*  HCT 31.8* 33.1* 39.3 35.7* 35.1*  MCV 86.2 88.7 86.6 88.1 88.6  PLT 146* 167 325 273 282   CBG: Recent Labs  Lab 12/15/23 1058 12/15/23 1658 12/15/23 2021 12/16/23 0728 12/16/23 1159  GLUCAP 81 158* 109* 123* 193*   Sepsis Labs Recent Labs  Lab 12/13/23 0350 12/14/23 1620 12/15/23 0455 12/16/23 0433  WBC 4.1 6.5 6.6 4.5   Microbiology Recent Results (from the past 240 hours)  Resp panel by RT-PCR (RSV, Flu A&B, Covid) Anterior Nasal Swab     Status: None   Collection Time: 12/11/23  4:12 PM   Specimen: Anterior  Nasal Swab  Result Value Ref Range Status   SARS Coronavirus 2 by RT PCR NEGATIVE NEGATIVE Final    Comment: (NOTE) SARS-CoV-2 target nucleic acids are NOT DETECTED.  The SARS-CoV-2 RNA is generally detectable in upper respiratory specimens during the acute phase of infection. The lowest concentration of SARS-CoV-2 viral copies this assay can detect is 138 copies/mL. A negative result does not preclude SARS-Cov-2 infection and should not be used as the sole basis for treatment or other patient management decisions. A negative result may occur with  improper specimen collection/handling, submission of specimen other than nasopharyngeal swab, presence of viral mutation(s) within the areas targeted by this assay, and inadequate number of viral copies(<138 copies/mL). A negative result must be combined with clinical observations, patient history, and epidemiological information. The expected result is Negative.  Fact Sheet for Patients:  BloggerCourse.com  Fact Sheet for Healthcare Providers:  SeriousBroker.it  This test is no t yet approved or cleared by the [place] FDA and  has been authorized for detection and/or diagnosis of SARS-CoV-2 by FDA under an Emergency Use Authorization (EUA). This EUA will remain  in effect (meaning this test can be used) for the duration of the COVID-19 declaration under Section 564(b)(1) of the Act, 21 U.S.C.section 360bbb-3(b)(1), unless the authorization is terminated  or revoked sooner.       Influenza A by PCR NEGATIVE NEGATIVE Final   Influenza B by PCR NEGATIVE NEGATIVE Final    Comment: (NOTE) The Xpert Xpress SARS-CoV-2/FLU/RSV plus assay is intended as an aid in the diagnosis of influenza from Nasopharyngeal swab specimens and should not be used as a sole basis for treatment. Nasal washings and aspirates are unacceptable for Xpert Xpress SARS-CoV-2/FLU/RSV testing.  Fact Sheet for  Patients: BloggerCourse.com  Fact Sheet for Healthcare Providers: SeriousBroker.it  This test is not  yet approved or cleared by the [place] and has been authorized for detection and/or diagnosis of SARS-CoV-2 by FDA under an Emergency Use Authorization (EUA). This EUA will remain in effect (meaning this test can be used) for the duration of the COVID-19 declaration under Section 564(b)(1) of the Act, 21 U.S.C. section 360bbb-3(b)(1), unless the authorization is terminated or revoked.     Resp Syncytial Virus by PCR NEGATIVE NEGATIVE Final    Comment: (NOTE) Fact Sheet for Patients: BloggerCourse.com  Fact Sheet for Healthcare Providers: SeriousBroker.it  This test is not yet approved or cleared by the [place] FDA and has been authorized for detection and/or diagnosis of SARS-CoV-2 by FDA under an Emergency Use Authorization (EUA). This EUA will remain in effect (meaning this test can be used) for the duration of the COVID-19 declaration under Section 564(b)(1) of the Act, 21 U.S.C. section 360bbb-3(b)(1), unless the authorization is terminated or revoked.  Performed at [facility], [address] [place], [place] [address]     Procedures/Studies: No results found.  Time coordinating discharge: 40 mins  SIGNED:  Carollee Herter, DO [facility] 12/16/23, 12:41 PM

## 2023-12-16 NOTE — Plan of Care (Signed)
Problem: Education: Goal: Knowledge of General Education information will improve Description: Including pain rating scale, medication(s)/side effects and non-pharmacologic comfort measures Outcome: Progressing   Problem: Health Behavior/Discharge Planning: Goal: Ability to manage health-related needs will improve Outcome: Progressing   Problem: Clinical Measurements: Goal: Ability to maintain clinical measurements within normal limits will improve Outcome: Progressing Goal: Will remain free from infection Outcome: Progressing Goal: Diagnostic test results will improve Outcome: Progressing Goal: Respiratory complications will improve Outcome: Progressing Goal: Cardiovascular complication will be avoided Outcome: Progressing   Problem: Activity: Goal: Risk for activity intolerance will decrease Outcome: Progressing   Problem: Nutrition: Goal: Adequate nutrition will be maintained Outcome: Progressing   Problem: Coping: Goal: Level of anxiety will decrease Outcome: Progressing   Problem: Elimination: Goal: Will not experience complications related to bowel motility Outcome: Progressing Goal: Will not experience complications related to urinary retention Outcome: Progressing   Problem: Pain Managment: Goal: General experience of comfort will improve and/or be controlled Outcome: Progressing   Problem: Safety: Goal: Ability to remain free from injury will improve Outcome: Progressing   Problem: Skin Integrity: Goal: Risk for impaired skin integrity will decrease Outcome: Progressing   Problem: Education: Goal: Ability to describe self-care measures that may prevent or decrease complications (Diabetes Survival Skills Education) will improve Outcome: Progressing Goal: Individualized Educational Video(s) Outcome: Progressing   Problem: Coping: Goal: Ability to adjust to condition or change in health will improve Outcome: Progressing   Problem: Fluid  Volume: Goal: Ability to maintain a balanced intake and output will improve Outcome: Progressing   Problem: Health Behavior/Discharge Planning: Goal: Ability to identify and utilize available resources and services will improve Outcome: Progressing Goal: Ability to manage health-related needs will improve Outcome: Progressing   Problem: Metabolic: Goal: Ability to maintain appropriate glucose levels will improve Outcome: Progressing   Problem: Nutritional: Goal: Maintenance of adequate nutrition will improve Outcome: Progressing Goal: Progress toward achieving an optimal weight will improve Outcome: Progressing   Problem: Skin Integrity: Goal: Risk for impaired skin integrity will decrease Outcome: Progressing   Problem: Tissue Perfusion: Goal: Adequacy of tissue perfusion will improve Outcome: Progressing   Problem: Education: Goal: Knowledge of General Education information will improve Description: Including pain rating scale, medication(s)/side effects and non-pharmacologic comfort measures Outcome: Progressing   Problem: Health Behavior/Discharge Planning: Goal: Ability to manage health-related needs will improve Outcome: Progressing   Problem: Clinical Measurements: Goal: Ability to maintain clinical measurements within normal limits will improve Outcome: Progressing Goal: Will remain free from infection Outcome: Progressing Goal: Diagnostic test results will improve Outcome: Progressing Goal: Respiratory complications will improve Outcome: Progressing Goal: Cardiovascular complication will be avoided Outcome: Progressing   Problem: Activity: Goal: Risk for activity intolerance will decrease Outcome: Progressing   Problem: Nutrition: Goal: Adequate nutrition will be maintained Outcome: Progressing   Problem: Coping: Goal: Level of anxiety will decrease Outcome: Progressing   Problem: Elimination: Goal: Will not experience complications related to  bowel motility Outcome: Progressing Goal: Will not experience complications related to urinary retention Outcome: Progressing   Problem: Pain Managment: Goal: General experience of comfort will improve and/or be controlled Outcome: Progressing   Problem: Safety: Goal: Ability to remain free from injury will improve Outcome: Progressing   Problem: Skin Integrity: Goal: Risk for impaired skin integrity will decrease Outcome: Progressing   Problem: Education: Goal: Ability to describe self-care measures that may prevent or decrease complications (Diabetes Survival Skills Education) will improve Outcome: Progressing Goal: Individualized Educational Video(s) Outcome: Progressing   Problem: Coping: Goal: Ability  to adjust to condition or change in health will improve Outcome: Progressing   Problem: Fluid Volume: Goal: Ability to maintain a balanced intake and output will improve Outcome: Progressing   Problem: Health Behavior/Discharge Planning: Goal: Ability to identify and utilize available resources and services will improve Outcome: Progressing Goal: Ability to manage health-related needs will improve Outcome: Progressing   Problem: Metabolic: Goal: Ability to maintain appropriate glucose levels will improve Outcome: Progressing   Problem: Nutritional: Goal: Maintenance of adequate nutrition will improve Outcome: Progressing Goal: Progress toward achieving an optimal weight will improve Outcome: Progressing   Problem: Skin Integrity: Goal: Risk for impaired skin integrity will decrease Outcome: Progressing   Problem: Tissue Perfusion: Goal: Adequacy of tissue perfusion will improve Outcome: Progressing   Problem: Education: Goal: Knowledge of General Education information will improve Description: Including pain rating scale, medication(s)/side effects and non-pharmacologic comfort measures Outcome: Progressing   Problem: Health Behavior/Discharge  Planning: Goal: Ability to manage health-related needs will improve Outcome: Progressing   Problem: Clinical Measurements: Goal: Ability to maintain clinical measurements within normal limits will improve Outcome: Progressing Goal: Will remain free from infection Outcome: Progressing Goal: Diagnostic test results will improve Outcome: Progressing Goal: Respiratory complications will improve Outcome: Progressing Goal: Cardiovascular complication will be avoided Outcome: Progressing   Problem: Activity: Goal: Risk for activity intolerance will decrease Outcome: Progressing   Problem: Nutrition: Goal: Adequate nutrition will be maintained Outcome: Progressing   Problem: Coping: Goal: Level of anxiety will decrease Outcome: Progressing   Problem: Elimination: Goal: Will not experience complications related to bowel motility Outcome: Progressing Goal: Will not experience complications related to urinary retention Outcome: Progressing   Problem: Pain Managment: Goal: General experience of comfort will improve and/or be controlled Outcome: Progressing   Problem: Safety: Goal: Ability to remain free from injury will improve Outcome: Progressing   Problem: Skin Integrity: Goal: Risk for impaired skin integrity will decrease Outcome: Progressing   Problem: Education: Goal: Ability to describe self-care measures that may prevent or decrease complications (Diabetes Survival Skills Education) will improve Outcome: Progressing Goal: Individualized Educational Video(s) Outcome: Progressing   Problem: Coping: Goal: Ability to adjust to condition or change in health will improve Outcome: Progressing   Problem: Fluid Volume: Goal: Ability to maintain a balanced intake and output will improve Outcome: Progressing   Problem: Health Behavior/Discharge Planning: Goal: Ability to identify and utilize available resources and services will improve Outcome: Progressing Goal:  Ability to manage health-related needs will improve Outcome: Progressing   Problem: Metabolic: Goal: Ability to maintain appropriate glucose levels will improve Outcome: Progressing   Problem: Nutritional: Goal: Maintenance of adequate nutrition will improve Outcome: Progressing Goal: Progress toward achieving an optimal weight will improve Outcome: Progressing   Problem: Skin Integrity: Goal: Risk for impaired skin integrity will decrease Outcome: Progressing   Problem: Tissue Perfusion: Goal: Adequacy of tissue perfusion will improve Outcome: Progressing

## 2023-12-16 NOTE — Assessment & Plan Note (Addendum)
Continue zoloft that was suggested by psych consult on 12-12-2023.

## 2023-12-16 NOTE — Assessment & Plan Note (Signed)
Multiple tophaceous gout of hands/fingers bilaterally

## 2023-12-16 NOTE — Assessment & Plan Note (Signed)
Resolved with IVF

## 2023-12-17 ENCOUNTER — Telehealth: Payer: Self-pay

## 2023-12-17 ENCOUNTER — Ambulatory Visit (INDEPENDENT_AMBULATORY_CARE_PROVIDER_SITE_OTHER): Payer: Medicaid Other | Admitting: Primary Care

## 2023-12-17 NOTE — Transitions of Care (Post Inpatient/ED Visit) (Signed)
12/17/2023  Name: Richard Lopez MRN: [id number] DOB: [birth year]  Today's TOC FU Call Status: Today's TOC FU Call Status:: Successful TOC FU Call Completed TOC FU Call Complete Date: 12/17/23 Patient's Name and Date of Birth confirmed.  Transition Care Management Follow-up Telephone Call Date of Discharge: 12/16/23 Discharge Facility: [facility]) Type of Discharge: Inpatient Admission Primary Inpatient Discharge Diagnosis:: AKI How have you been since you were released from the hospital?: Better Any questions or concerns?: No  Items Reviewed: Did you receive and understand the discharge instructions provided?: Yes Medications obtained,verified, and reconciled?: Yes (Medications Reviewed) (He said he has all medications expcept for the zofran which he plans to pick up today. He also stated that he has the feeding supplement. He didnt have any questions about the med regime) Any new allergies since your discharge?: No Dietary orders reviewed?: No Do you have support at home?: Yes People in Home: significant other Name of Support/Comfort Primary Source: his  girlfriend and his mother  Medications Reviewed Today: Medications Reviewed Today     Reviewed by Robyne Peers, RN (Case Manager) on 12/17/23 at 1311  Med List Status: <None>   Medication Order Taking? Sig Documenting Provider Last Dose Status Informant  amLODipine (NORVASC) 10 MG tablet [id number]  Take 1 tablet (10 mg total) by mouth daily. Carollee Herter, DO  Active   feeding supplement (ENSURE ENLIVE / ENSURE PLUS) LIQD [id number] No Take 237 mLs by mouth 3 (three) times daily between meals.  Patient taking differently: Take 237 mLs by mouth See admin instructions. Drink 237 ml's by mouth one to two times a day between meals   Rolly Salter, MD 12/10/2023 Active Self, Pharmacy Records  mirtazapine (REMERON) 7.5 MG tablet [id number] No Take 1 tablet (7.5 mg total) by mouth at bedtime. For anxiety and to help sleep Pieter Partridge, MD 12/13/2023 Bedtime Active Self, Pharmacy Records  Multiple Vitamin (MULTIVITAMIN WITH MINERALS) TABS tablet [id number] No Take 1 tablet by mouth daily. Marguerita Merles [place], [place] 12/13/2023 Active Self, Pharmacy Records  ondansetron [facility]) 4 MG tablet [id number]  Take 1 tablet (4 mg total) by mouth every 6 (six) hours as needed for nausea or vomiting. Carollee Herter, DO  Active   pantoprazole (PROTONIX) 40 MG tablet [id number] No Take 1 tablet (40 mg total) by mouth 2 (two) times daily before a meal.  Patient taking differently: Take 40 mg by mouth See admin instructions. Take 40 mg by mouth one to two times a day 30 minutes prior to a meal   Rai, Ripudeep K, MD 12/13/2023 Active Self, Pharmacy Records  prazosin (MINIPRESS) 1 MG capsule [id number] No Take 1 capsule (1 mg total) by mouth at bedtime. To prevent nightmares Pieter Partridge, MD 12/13/2023 Active Self, Pharmacy Records  sertraline (ZOLOFT) 50 MG tablet [id number] No Take 1 tablet (50 mg total) by mouth daily. For anxiety and depression Pieter Partridge, MD 12/13/2023 Active Self, Pharmacy Records  TYLENOL 8 HOUR ARTHRITIS PAIN 650 MG CR tablet [id number]  Take 1,300 mg by mouth every 8 (eight) hours as needed (for arthritic pain). [provider]  Active Self, Pharmacy Records            Home Care and Equipment/Supplies: Were Home Health Services Ordered?: No Any new equipment or medical supplies ordered?: No  Functional Questionnaire: Do you need assistance with bathing/showering or dressing?: No Do you need assistance with meal preparation?: Yes (his girlfriend assists as needed) Do you need assistance with  eating?: No Do you have difficulty maintaining continence: No Do you need assistance with getting out of bed/getting out of a chair/moving?: Yes (He has a cane to use if needed) Do you have difficulty managing or taking your medications?: No  Follow up appointments reviewed: PCP Follow-up  appointment confirmed?: Yes Date of PCP follow-up appointment?: 12/23/23 Follow-up Provider: Gwinda Passe, NP Specialist Hospital Follow-up appointment confirmed?: NA Do you need transportation to your follow-up appointment?: No (I explained to him that he can contact his insurance company for rides to medical appointments.  He also understands that he can contact [employer] if he has no ride and they will arrrange a cab ride for him) Do you understand care options if your condition(s) worsen?: Yes-patient verbalized understanding    SIGNATURE Robyne Peers, RN

## 2023-12-22 ENCOUNTER — Telehealth (INDEPENDENT_AMBULATORY_CARE_PROVIDER_SITE_OTHER): Payer: Self-pay | Admitting: Primary Care

## 2023-12-22 ENCOUNTER — Telehealth: Payer: Self-pay

## 2023-12-22 NOTE — Telephone Encounter (Signed)
 Opened in error

## 2023-12-22 NOTE — Telephone Encounter (Signed)
 Called pt to remind them about atp. Pt will be present

## 2023-12-23 ENCOUNTER — Telehealth (INDEPENDENT_AMBULATORY_CARE_PROVIDER_SITE_OTHER): Payer: Medicaid Other | Admitting: Primary Care

## 2023-12-23 ENCOUNTER — Telehealth (INDEPENDENT_AMBULATORY_CARE_PROVIDER_SITE_OTHER): Payer: Self-pay | Admitting: Primary Care

## 2023-12-23 NOTE — Telephone Encounter (Signed)
Called pt to see if interested in coming in sooner today because provider will be leaving office at 10 am 2/19.

## 2023-12-25 ENCOUNTER — Other Ambulatory Visit: Payer: Self-pay | Admitting: Licensed Clinical Social Worker

## 2023-12-25 NOTE — Patient Instructions (Signed)
Visit Information  Richard Lopez was given information about Medicaid Managed Care team care coordination services as a part of their [facility] Medicaid benefit. Richard Lopez verbally consented to engagement with the [facility] Managed Care team.   If you are experiencing a medical emergency, please call 911 or report to your local emergency department or urgent care.   If you have a non-emergency medical problem during routine business hours, please contact your provider's office and ask to speak with a nurse.   For questions related to your [facility] health plan, please call: [phone] or go here:[email or web address]  If you would like to schedule transportation through your [facility] plan, please call the following number at least 2 days in advance of your appointment: [phone].   You can also use the MTM portal or MTM mobile app to manage your rides. Reimbursement for transportation is available through [facility] Richard Lopez! For the portal, please go to [email or web address]  Call the [facility] Crisis Line at [phone], at any time, 24 hours a day, 7 days a week. If you are in danger or need immediate medical attention call 911.  If you would like help to quit smoking, call 1-800-QUIT-NOW (8075013754) OR Espaol: 1-855-Djelo-Ya (9-563-875-6433) o para ms informacin haga clic aqu or Text READY to 295-188 to register via text  Following is a copy of your plan of care:  Care Plan : LCSW Plan of Care  Updates made by Richard Bryant, LCSW since 12/25/2023 12:00 AM     Problem: Coping Skills (General Plan of Care)      Goal: Coping Skills Enhanced   Start Date: 12/25/2023  Priority: High  Note:   Timeframe:  Short-Range Goal Priority:  High Start Date:   12/25/23         Expected End Date:  ongoing                     Follow Up Date--01/08/24 at 11 am  - keep 90 percent of scheduled appointments -consider counseling or psychiatry -consider bumping up your  self-care  -consider creating a stronger support network   Why is this important?             Combating depression may take some time.            If you don't feel better right away, don't give up on your treatment plan.    Current barriers:   Chronic Mental Health needs related to symptoms of depression and stress. Patient requires Support, Education, Resources, Referrals, Advocacy, and Care Coordination, in order to meet Unmet Mental Health Needs and to find a long term therapist. Mental Health Concerns and Social Isolation Patient lacks knowledge of local and available community resources and counseling agencies.   Clinical Goal(s): verbalize understanding of plan for management of Depression and Stress symptoms and demonstrate a reduction in symptoms. Patient will connect with a provider for ongoing mental health treatment, increase coping skills, healthy habits, self-management skills, and stress reduction      Patient will implement clinical interventions discussed today to decrease symptoms of depression and increase knowledge and/or ability of: coping skills.    Patient Goals/Self-Care Activities: Take medications as prescribed   Attend all scheduled provider appointments Call pharmacy for medication refills 3-7 days in advance of running out of medications Perform all self care activities independently  Perform IADL's (shopping, preparing meals, housekeeping, managing finances) independently Call provider office for new concerns or questions Work with the social worker to  address care coordination needs and will continue to work with the clinical team to address health care and disease management related needs call 1-800-273-TALK (toll free, 24 hour hotline) If in a crisis, CALL 988 or go to [facility] [address] [place] [phone]) Utilize healthy coping skills and supportive resources discussed Contact PCP with any questions or  concerns Keep 90 percent of counseling appointments Call your insurance provider for more information about your Enhanced Benefits  Check out counseling resources provided  Begin personal counseling with LCSW, to reduce and manage symptoms of Depression and Stress, until well-established with mental health provider Accept all calls from representative with Behavioral Health Providers in an effort to establish ongoing mental health counseling and supportive services. Incorporate into daily practice - relaxation techniques, deep breathing exercises, and mindfulness meditation strategies. Talk about feelings with friends, family members, spiritual advisor, etc. Contact LCSW directly [phone]), if you have questions, need assistance, or if additional social work needs are identified between now and our next scheduled telephone outreach call. Call 988 for mental health hotline/crisis line if needed (24/7 available) Try techniques to reduce symptoms of anxiety/negative thinking (deep breathing, distraction, positive self talk, etc)   Follow Up Plan:  The patient has been provided with contact information for the care management team and has been advised to call with any mental health or health related questions or concerns.  The care management team will reach out to the patient again over the next 30 business  days.   If you are experiencing a Mental Health or Behavioral Health Crisis or need someone to talk to, please call the Suicide and Crisis Lifeline: 988    Patient Goals: Initial goal  Richard Lopez, BSW, MSW, LCSW Licensed Clinical Social Worker [employer]   [facility] Richard Lopez Direct Dial: [phone]

## 2023-12-25 NOTE — Patient Outreach (Signed)
Medicaid Managed Care Social Work Note  12/25/2023 Name:  Richard Lopez MRN:  [id number] DOB:  [birth year]  Richard Lopez is an 48 y.o. year old male who is a primary patient of [name], NP.  The Medicaid Managed Care Coordination team was consulted for assistance with:  Mental Health Counseling and Resources  Richard Lopez was given information about Medicaid Managed Care Coordination team services today. Richard Lopez Patient agreed to services and verbal consent obtained.  Engaged with patient  for by telephone forinitial visit in response to referral for case management and/or care coordination services.   Patient is participating in a Managed Medicaid Plan:  Yes  Assessments/Interventions:  Review of past medical history, allergies, medications, health status, including review of consultants reports, laboratory and other test data, was performed as part of comprehensive evaluation and provision of chronic care management services.  SDOH: (Social Drivers of Health) assessments and interventions performed: SDOH Interventions    Flowsheet Row Patient Outreach from 12/25/2023 in [place] [facility] to Hosp-Admission (Discharged) from 12/14/2023 in [facility] Office Visit from 05/21/2023 in [facility]icine from 11/07/2020 in [facility]  SDOH Interventions      Food Insecurity Interventions -- [employer] Provided, Inpatient TOC -- --  Housing Interventions Intervention Not Indicated -- -- --  Catering manager, Patient Resources Dietitian) -- -- --  Depression Interventions/Treatment  Programmer, applications Provided -- Scientist, research (medical) Interventions [employer] Provided -- -- --  Stress Interventions Provide Counseling, [employer] Provided -- -- --       Advanced Directives  Status:  See Care Plan for related entries.  Care Plan                 Allergies  Allergen Reactions   Ativan [Lorazepam] Anxiety and Other (See Comments)    Hallucinations   Nsaids Other (See Comments)    Hx of esophageal ulcer    Medications Reviewed Today     Reviewed by Gustavus Bryant, LCSW (Social Worker) on 12/25/23 at [phone]  Med List Status: <None>   Medication Order Taking? Sig Documenting Provider Last Dose Status Informant  amLODipine (NORVASC) 10 MG tablet [id number]  Take 1 tablet (10 mg total) by mouth daily. Carollee Herter, DO  Active   feeding supplement (ENSURE ENLIVE / ENSURE PLUS) LIQD [id number] No Take 237 mLs by mouth 3 (three) times daily between meals.  Patient taking differently: Take 237 mLs by mouth See admin instructions. Drink 237 ml's by mouth one to two times a day between meals   Rolly Salter, MD 12/10/2023 Active Self, Pharmacy Records  mirtazapine (REMERON) 7.5 MG tablet [id number] No Take 1 tablet (7.5 mg total) by mouth at bedtime. For anxiety and to help sleep Pieter Partridge, MD 12/13/2023 Bedtime Active Self, Pharmacy Records  Multiple Vitamin (MULTIVITAMIN WITH MINERALS) TABS tablet [id number] No Take 1 tablet by mouth daily. Marguerita Merles [place], [place] 12/13/2023 Active Self, Pharmacy Records  ondansetron [facility]) 4 MG tablet [id number]  Take 1 tablet (4 mg total) by mouth every 6 (six) hours as needed for nausea or vomiting. Carollee Herter, DO  Active   pantoprazole (PROTONIX) 40 MG tablet [id number] No Take 1 tablet (40 mg total) by mouth 2 (two) times daily before a meal.  Patient taking differently: Take 40 mg by mouth See admin instructions. Take 40 mg by mouth one  to two times a day 30 minutes prior to a meal   Rai, Ripudeep K, MD 12/13/2023 Active Self, Pharmacy Records  prazosin (MINIPRESS) 1 MG capsule [id number] No Take 1 capsule (1 mg total) by mouth at bedtime. To prevent nightmares Pieter Partridge, MD 12/13/2023 Active Self, Pharmacy Records   sertraline (ZOLOFT) 50 MG tablet [id number] No Take 1 tablet (50 mg total) by mouth daily. For anxiety and depression Pieter Partridge, MD 12/13/2023 Active Self, Pharmacy Records  TYLENOL 8 HOUR ARTHRITIS PAIN 650 MG CR tablet [id number]  Take 1,300 mg by mouth every 8 (eight) hours as needed (for arthritic pain). 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 07/29/2014   Type 2 diabetes mellitus with hyperlipidemia (HCC) 07/29/2014   Essential hypertension 07/29/2014    Conditions to be addressed/monitored per PCP order:  Depression  Care Plan : LCSW Plan of Care  Updates made by Gustavus Bryant, LCSW since 12/25/2023 12:00 AM     Problem: Coping Skills (General Plan of Care)      Goal: Coping Skills Enhanced   Start Date: 12/25/2023  Priority: High  Note:   Timeframe:  Short-Range Goal Priority:  High Start Date:   12/25/23         Expected End Date:  ongoing                     Follow Up Date--01/08/24 at 11 am  - keep 90 percent of scheduled appointments -consider counseling or psychiatry -consider bumping up your self-care  -consider creating a stronger support network   Why is this important?             Combating depression may take some time.             If you don't feel better right away, don't give up on your treatment plan.    Current barriers:   Chronic Mental Health needs related to symptoms of depression and stress. Patient requires Support, Education, Resources, Referrals, Advocacy, and Care Coordination, in order to meet Unmet Mental Health Needs and to find a long term therapist. Mental Health Concerns and Social Isolation Patient lacks knowledge of local and available community resources and counseling agencies.   Clinical Goal(s): verbalize understanding of plan for management of Depression and Stress symptoms and demonstrate a reduction in symptoms. Patient will connect with a provider for ongoing mental health treatment, increase coping skills, healthy habits, self-management skills, and stress reduction      Patient will implement clinical interventions discussed today to decrease symptoms of depression and increase knowledge and/or ability of: coping skills.    Clinical Interventions:  Assessed patient's previous and current treatment, coping skills, support system and barriers  to care. Patient provided hx  Verbalization of feelings encouraged, motivational interviewing employed Emotional support provided, positive coping strategies explored. Establishing healthy boundaries emphasized and healthy self-care education provided Patient was educated on available mental health resources within their area that accept Medicaid and offer counseling and psychiatry. Patient was advised to contact the back of his insurance card for assistance with benefits as well. Patient educated on the difference between therapy and psychiatry per patient request. Email sent to patient today with available mental health resources within his area that accept Medicaid and offer the services that he is interested in. Patient is interested I'm only therapy at this time. Email included instructions for scheduling at [facility] as well as some crisis  support resources and [facility]'s walk in clinic hours. Patient will review resources over the next two-four weeks and will make a decision regarding where he wishes to gain MH treatment at. Emotional support provided. CBT intervention implemented regarding "being mentally fit" by combating negative thinking and replacing it with uplifting support, hope and positivity. Assessed social determinant of health barriers Patient receives strong support from girlfriend and mother.  Patient will work on implementing appropriate self-care habits into their daily routine such as: staying positive, writing a gratitude list, drinking water, staying active around the house, taking their medications as prescribed, combating negative thoughts or emotions and staying connected with their family and friends. Positive reinforcement provided for this decision to work on this.  Motivational Interviewing employed Depression screen reviewed  PHQ2/ PHQ9 completed or reviewed  Mindfulness or Relaxation training provided Active listening / Reflection utilized  Advance Care and HCPOA education provided Emotional Support Provided Problem Solving /Task Center strategies reviewed Provided psychoeducation for mental health needs  Provided brief CBT  Reviewed mental health medications and discussed importance of compliance:  Quality of sleep assessed & Sleep Hygiene techniques promoted  Participation in counseling encouraged  Verbalization of feelings encouraged  Suicidal Ideation/Homicidal Ideation assessed: Patient denies SI/HI  Review resources, discussed options and provided patient information about  Mental Health Resources Inter-disciplinary care team collaboration (see longitudinal plan of care) Dental resources included in email that was sent out by [facility] LCSW  Patient Goals/Self-Care Activities: Take medications as prescribed   Attend all scheduled provider appointments Call pharmacy for medication refills 3-7 days  in advance of running out of medications Perform all self care activities independently  Perform IADL's (shopping, preparing meals, housekeeping, managing finances) independently Call provider office for new concerns or questions Work with the social worker to address care coordination needs and will continue to work with the clinical team to address health care and disease management related needs call 1-800-273-TALK (toll free, 24 hour hotline) If in a crisis, CALL 988 or go to [facility] [address] [place] [phone]) Utilize healthy coping skills and supportive resources discussed Contact PCP with any questions or concerns Keep 90 percent of counseling appointments Call your insurance provider for more information about your Enhanced Benefits  Check out counseling resources provided  Begin personal counseling with LCSW, to reduce and manage symptoms of Depression and Stress, until well-established with mental health provider Accept all calls from representative with Behavioral Health Providers in an effort to establish ongoing mental health counseling and supportive services. Incorporate into daily practice - relaxation techniques, deep breathing exercises, and mindfulness meditation strategies. Talk about feelings with friends, family members, spiritual advisor, etc. Contact LCSW directly [phone]), if you have questions, need assistance, or if additional social work needs  are identified between now and our next scheduled telephone outreach call. Call 988 for mental health hotline/crisis line if needed (24/7 available) Try techniques to reduce symptoms of anxiety/negative thinking (deep breathing, distraction, positive self talk, etc)   Follow Up Plan:  The patient has been provided with contact information for the care management team and has been advised to call with any mental health or health related questions or concerns.  The care  management team will reach out to the patient again over the next 30 business  days.   If you are experiencing a Mental Health or Behavioral Health Crisis or need someone to talk to, please call the Suicide and Crisis Lifeline: 988    Patient Goals: Initial goal      Follow up:  Patient agrees to Care Plan and Follow-up.  Plan: The Managed Medicaid care management team will reach out to the patient again over the next 30 days.  Dickie La, BSW, MSW, LCSW Licensed Clinical Social Worker [employer]   [facility] [email or web address] Direct Dial: [phone]

## 2024-01-08 ENCOUNTER — Other Ambulatory Visit: Payer: Self-pay | Admitting: Licensed Clinical Social Worker

## 2024-01-08 NOTE — Patient Instructions (Signed)
 Visit Information  Thank you for taking time to visit with me today. Please don't hesitate to contact me if I can be of assistance to you.   Following are the goals we discussed today:   Goals Addressed             This Visit's Progress    Care Coordination       Activities and task to complete in order to accomplish goals.   Keep schedule appt with pcp 01/15/2024 Scheduled counseling appt with [facility] or [facility].  Continue taking prescribed medication as directed by physician Engage in self care techniques to reduce stress.          Our next appointment is by telephone on 02/01/2024 at 10am  Please call the care guide team at [phone] if you need to cancel or reschedule your appointment.   If you are experiencing a Mental Health or Behavioral Health Crisis or need someone to talk to, please call 911   Patient verbalizes understanding of instructions and care plan provided today and agrees to view in MyChart. Active MyChart status and patient understanding of how to access instructions and care plan via MyChart confirmed with patient.     Telephone follow up appointment with care management team member scheduled for:02/01/2024  Gwyndolyn Saxon MSW, LCSW Licensed Clinical Social Worker  [facility], Population Health Direct Dial: [phone]  Fax: [phone]

## 2024-01-08 NOTE — Patient Outreach (Signed)
 Care Coordination   Initial Visit Note   01/08/2024 Name: Richard Lopez MRN: [id number] DOB: 07-08-1976  Richard Lopez is a 48 y.o. year old male who sees [name], NP for primary care. I spoke with  Richard Huger Rawe by phone today.  What matters to the patients health and wellness today?  Pt requested assistance with scheduling follow up appt, and locating mental health resources.     Goals Addressed             This Visit's Progress    Care Coordination       Activities and task to complete in order to accomplish goals.   Keep schedule appt with pcp 01/15/2024 Scheduled counseling appt with [facility] or [facility].  Continue taking prescribed medication as directed by physician Engage in self care techniques to reduce stress.          SDOH assessments and interventions completed:  Yes  SDOH Interventions Today    Flowsheet Row Most Recent Value  SDOH Interventions   Housing Interventions Intervention Not Indicated  Transportation Interventions Intervention Not Indicated        Care Coordination Interventions:  Yes, provided  Interventions Today    Flowsheet Row Most Recent Value  Education Interventions   Education Provided Provided Education  Provided Verbal Education On Mental Health/Coping with Illness  [Pt given information for mental health providers.]  Mental Health Interventions   Mental Health Discussed/Reviewed Mental Health Reviewed, Coping Strategies  [Encourage to engage in self care and deep breathing to alleviate stress.]       Follow up plan: Follow up call scheduled for 02/01/2024 10am    Encounter Outcome:  Patient Visit Completed   Gwyndolyn Saxon MSW, LCSW Licensed Clinical Social Worker  [facility], Population Health Direct Dial: [phone]  Fax: [phone]

## 2024-01-14 ENCOUNTER — Telehealth (INDEPENDENT_AMBULATORY_CARE_PROVIDER_SITE_OTHER): Payer: Self-pay | Admitting: Primary Care

## 2024-01-14 NOTE — Telephone Encounter (Signed)
 Spoke to pt about appt.. Will be present

## 2024-01-15 ENCOUNTER — Encounter (INDEPENDENT_AMBULATORY_CARE_PROVIDER_SITE_OTHER): Payer: Self-pay | Admitting: Primary Care

## 2024-01-15 ENCOUNTER — Ambulatory Visit (INDEPENDENT_AMBULATORY_CARE_PROVIDER_SITE_OTHER): Admitting: Primary Care

## 2024-01-15 VITALS — BP 148/78 | HR 82 | Ht 78.0 in | Wt 236.2 lb

## 2024-01-15 DIAGNOSIS — Z09 Encounter for follow-up examination after completed treatment for conditions other than malignant neoplasm: Secondary | ICD-10-CM

## 2024-01-15 DIAGNOSIS — I10 Essential (primary) hypertension: Secondary | ICD-10-CM

## 2024-01-15 NOTE — Progress Notes (Signed)
 Subjective:   Richard Lopez is a 48 y.o. male presents for hospital follow up.  Patient presented to the emergency room on 12/14/23 with abdominal pain.[name] Discharged from the hospital on 12/16/23,   Patient was admit for acute kidney injury -he had some nausea and vomiting prior to, essential hypertension, gout, acute gastritis and esophagitis, anxiety and depression, type 2 diabetes. Chronic pain right hip (hx of 2 fx) sometimes unable to sit or lay due to the pain. Past Medical History:  Diagnosis Date   Alcohol dependence with intoxication (HCC) 03/25/2019   Anxiety    Arthritis    hands and possibly knee   Atrial fibrillation with RVR (HCC) 04/22/2020   Closed right hip fracture, initial encounter (HCC) 03/25/2019   Depression    Diabetes mellitus    diet controlled   Esophagitis determined by endoscopy 08/21/2019   Essential hypertension    Gastritis    Gout    Intertrochanteric fracture of right hip (HCC) 04/14/2019   Normocytic anemia due to blood loss 08/21/2019   Pulmonary embolism (HCC)      Allergies  Allergen Reactions   Ativan [Lorazepam] Anxiety and Other (See Comments)    Hallucinations   Nsaids Other (See Comments)    Hx of esophageal ulcer    Current Outpatient Medications on File Prior to Visit  Medication Sig Dispense Refill   amLODipine (NORVASC) 10 MG tablet Take 1 tablet (10 mg total) by mouth daily. 90 tablet 0   feeding supplement (ENSURE ENLIVE / ENSURE PLUS) LIQD Take 237 mLs by mouth 3 (three) times daily between meals. (Patient not taking: Reported on 01/15/2024) 10000 mL 0   mirtazapine (REMERON) 7.5 MG tablet Take 1 tablet (7.5 mg total) by mouth at bedtime. For anxiety and to help sleep (Patient not taking: Reported on 01/15/2024) 30 tablet 0   Multiple Vitamin (MULTIVITAMIN WITH MINERALS) TABS tablet Take 1 tablet by mouth daily. (Patient not taking: Reported on 01/15/2024) 30 tablet 0   pantoprazole (PROTONIX) 40 MG tablet Take 1 tablet (40 mg total)  by mouth 2 (two) times daily before a meal. (Patient not taking: Reported on 01/15/2024) 60 tablet 3   prazosin (MINIPRESS) 1 MG capsule Take 1 capsule (1 mg total) by mouth at bedtime. To prevent nightmares (Patient not taking: Reported on 01/15/2024) 30 capsule 0   sertraline (ZOLOFT) 50 MG tablet Take 1 tablet (50 mg total) by mouth daily. For anxiety and depression (Patient not taking: Reported on 01/15/2024) 30 tablet 0   TYLENOL 8 HOUR ARTHRITIS PAIN 650 MG CR tablet Take 1,300 mg by mouth every 8 (eight) hours as needed (for arthritic pain). (Patient not taking: Reported on 01/15/2024)     No current facility-administered medications on file prior to visit.    Review of System: ROS Comprehensive ROS Pertinent positive and negative noted in HPI   Objective:  BP (!) 148/78 (Cuff Size: Normal)   Pulse 82   Ht 6\' 6"  (1.981 m)   Wt 236 lb 3.2 oz (107.1 kg)   SpO2 100%   BMI 27.30 kg/m   Filed Weights   01/15/24 1036  Weight: 236 lb 3.2 oz (107.1 kg)    Physical Exam Vitals reviewed.  HENT:     Head: Normocephalic.     Right Ear: Tympanic membrane and external ear normal.     Left Ear: Tympanic membrane and external ear normal.     Nose: Nose normal.  Eyes:     Extraocular Movements:  Extraocular movements intact.     Pupils: Pupils are equal, round, and reactive to light.  Cardiovascular:     Rate and Rhythm: Normal rate and regular rhythm.  Pulmonary:     Effort: Pulmonary effort is normal.     Breath sounds: Normal breath sounds.  Abdominal:     General: Bowel sounds are normal. There is distension.     Palpations: Abdomen is soft.  Musculoskeletal:        General: Normal range of motion.  Skin:    General: Skin is warm and dry.  Neurological:     Mental Status: He is oriented to person, place, and time.  Psychiatric:        Mood and Affect: Mood normal.        Behavior: Behavior normal.        Thought Content: Thought content normal.        Judgment: Judgment  normal.      Assessment:   [name] was seen today for hospitalization follow-up.  Diagnoses and all orders for this visit:  Hospital discharge follow-up See HPI  Essential hypertension BP goal - < 130/80 Explained that having normal blood pressure is the goal and medications are helping to get to goal and maintain normal blood pressure. DIET: Limit salt intake, read nutrition labels to check salt content, limit fried and high fatty foods  Avoid using multisymptom OTC cold preparations that generally contain sudafed which can rise BP. Consult with pharmacist on best cold relief products to use for persons with HTN EXERCISE Discussed incorporating exercise such as walking - 30 minutes most days of the week and can do in 10 minute intervals        This note has been created with Education officer, environmental. Any transcriptional errors are unintentional.   Return in about 6 weeks (around 02/26/2024) for new meds / Bp .  [name], NP 01/23/2024, 8:02 PM

## 2024-02-01 ENCOUNTER — Ambulatory Visit: Payer: Self-pay | Admitting: Licensed Clinical Social Worker

## 2024-02-01 NOTE — Patient Instructions (Signed)
 Visit Information  Thank you for taking time to visit with me today. Please don't hesitate to contact me if I can be of assistance to you.   Following are the goals we discussed today:   Goals Addressed             This Visit's Progress    COMPLETED: Care Coordination       Activities and task to complete in order to accomplish goals.   Keep schedule appt with pcp 01/15/2024 Scheduled counseling appt with [facility] or [facility].  Continue taking prescribed medication as directed by physician Engage in self care techniques to reduce stress.           Please call the care guide team at [phone] if you need to cancel or reschedule your appointment.   If you are experiencing a Mental Health or Behavioral Health Crisis or need someone to talk to, please call 911   Patient verbalizes understanding of instructions and care plan provided today and agrees to view in MyChart. Active MyChart status and patient understanding of how to access instructions and care plan via MyChart confirmed with patient.     The patient has been provided with contact information for the care management team and has been advised to call with any health related questions or concerns.   Gwyndolyn Saxon MSW, LCSW Licensed Clinical Social Worker  [facility], Population Health Direct Dial: [phone]  Fax: [phone]

## 2024-02-01 NOTE — Patient Outreach (Signed)
 Care Coordination   Follow Up Visit Note   02/01/2024 Name: Richard Lopez MRN: [id number] DOB: 12-25-1975  Richard Lopez is a 48 y.o. year old male who sees [name], NP for primary care. I spoke with  Richard Huger Wiberg by phone today.  What matters to the patients health and wellness today?  Richard Lopez attended follow up visit with pcp and reports he does not have any additional needs.     Goals Addressed             This Visit's Progress    COMPLETED: Care Coordination       Activities and task to complete in order to accomplish goals.   Keep schedule appt with pcp 01/15/2024 Scheduled counseling appt with [facility] or [facility].  Continue taking prescribed medication as directed by physician Engage in self care techniques to reduce stress.          SDOH assessments and interventions completed:  Yes  SDOH Interventions Today    Flowsheet Row Most Recent Value  SDOH Interventions   Food Insecurity Interventions Intervention Not Indicated  Utilities Interventions Intervention Not Indicated        Care Coordination Interventions:  Yes, provided  Interventions Today    Flowsheet Row Most Recent Value  Education Interventions   Education Provided Provided Education  Provided Verbal Education On Other  [Pt completed follow up appt with pcp]  Mental Health Interventions   Mental Health Discussed/Reviewed Mental Health Reviewed, Other  [Pt reports he has counseling resources and will call to  schedule pt]       Follow up plan: No further intervention required.   Encounter Outcome:  Patient Visit Completed   Gwyndolyn Saxon MSW, LCSW Licensed Clinical Social Worker  [facility], Population Health Direct Dial: [phone]  Fax: [phone]

## 2024-02-29 ENCOUNTER — Ambulatory Visit (INDEPENDENT_AMBULATORY_CARE_PROVIDER_SITE_OTHER)

## 2024-03-02 ENCOUNTER — Ambulatory Visit (INDEPENDENT_AMBULATORY_CARE_PROVIDER_SITE_OTHER): Admitting: Primary Care

## 2024-03-02 VITALS — BP 127/80 | HR 69 | Resp 16 | Wt 239.8 lb

## 2024-03-02 DIAGNOSIS — R112 Nausea with vomiting, unspecified: Secondary | ICD-10-CM

## 2024-03-02 DIAGNOSIS — K219 Gastro-esophageal reflux disease without esophagitis: Secondary | ICD-10-CM | POA: Diagnosis not present

## 2024-03-02 DIAGNOSIS — I10 Essential (primary) hypertension: Secondary | ICD-10-CM | POA: Diagnosis not present

## 2024-03-02 MED ORDER — ONDANSETRON HCL 4 MG PO TABS: 4.0000 mg | ORAL_TABLET | Freq: Three times a day (TID) | ORAL | 1 refills | Status: DC | PRN

## 2024-03-02 MED ORDER — PANTOPRAZOLE SODIUM 40 MG PO TBEC: 80.0000 mg | DELAYED_RELEASE_TABLET | Freq: Every day | ORAL | 1 refills | Status: DC

## 2024-03-02 MED ORDER — ADULT MULTIVITAMIN W/MINERALS CH: 1.0000 | ORAL_TABLET | Freq: Every day | ORAL | 3 refills | Status: AC

## 2024-03-02 NOTE — Progress Notes (Addendum)
 [facility]   Richard Lopez is a 48 y.o. male presents for hypertension evaluation, Denies shortness of breath, headaches, chest pain or lower extremity edema, sudden onset, vision changes, unilateral weakness, dizziness, paresthesias   Patient reports adherence with medications.  Exercise habits include:yes Family / Social history: father diabetes and CVA   Past Medical History:  Diagnosis Date   Alcohol dependence with intoxication (HCC) 03/25/2019   Anxiety    Arthritis    hands and possibly knee   Atrial fibrillation with RVR (HCC) 04/22/2020   Closed right hip fracture, initial encounter (HCC) 03/25/2019   Depression    Diabetes mellitus    diet controlled   Esophagitis determined by endoscopy 08/21/2019   Essential hypertension    Gastritis    Gout    Intertrochanteric fracture of right hip (HCC) 04/14/2019   Normocytic anemia due to blood loss 08/21/2019   Pulmonary embolism [facility])    Past Surgical History:  Procedure Laterality Date   BIOPSY  11/12/2022   Procedure: BIOPSY;  Surgeon: Genell Ken, MD;  Location: [facility];  Service: Gastroenterology;;   ESOPHAGOGASTRODUODENOSCOPY (EGD) WITH PROPOFOL  N/A 08/14/2019   Procedure: ESOPHAGOGASTRODUODENOSCOPY (EGD) WITH PROPOFOL ;  Surgeon: Baldo Bonds, MD;  Location: [facility];  Service: Endoscopy;  Laterality: N/A;   ESOPHAGOGASTRODUODENOSCOPY (EGD) WITH PROPOFOL  N/A 11/12/2022   Procedure: ESOPHAGOGASTRODUODENOSCOPY (EGD) WITH PROPOFOL ;  Surgeon: Genell Ken, MD;  Location: [facility];  Service: Gastroenterology;  Laterality: N/A;   EXTERNAL FIXATION LEG Left 11/07/2018   Procedure: EXTERNAL FIXATION LEFT LOWER LEG;  Surgeon: Adonica Hoose, MD;  Location: [facility];  Service: Orthopedics;  Laterality: Left;   EXTERNAL FIXATION REMOVAL Left 11/08/2018   Procedure: REMOVAL EXTERNAL FIXATION LEG;  Surgeon: Laneta Pintos, MD;  Location: [facility] OR;  Service: Orthopedics;  Laterality: Left;    INTRAMEDULLARY (IM) NAIL INTERTROCHANTERIC Right 03/26/2019   Procedure: INTRAMEDULLARY (IM) NAIL INTERTROCHANTRIC;  Surgeon: Laneta Pintos, MD;  Location: [facility] OR;  Service: Orthopedics;  Laterality: Right;   INTRAMEDULLARY (IM) NAIL INTERTROCHANTERIC Right 05/17/2019   Procedure: Intramedullary (Im) Nail Intertroch with circlage wiring;  Surgeon: Hardy Lia, MD;  Location: [facility] OR;  Service: Orthopedics;  Laterality: Right;   NO PAST SURGERIES     OPEN REDUCTION INTERNAL FIXATION (ORIF) TIBIA/FIBULA FRACTURE Left 11/08/2018   Procedure: OPEN REDUCTION INTERNAL FIXATION (ORIF) TIBIA/FIBULA FRACTURE;  Surgeon: Laneta Pintos, MD;  Location: [facility];  Service: Orthopedics;  Laterality: Left;   ORIF FEMUR FRACTURE Right 05/17/2019   Procedure: REMOVAL  OF HARDWARE;  Surgeon: Hardy Lia, MD;  Location: [facility] OR;  Service: Orthopedics;  Laterality: Right;   Allergies  Allergen Reactions   Ativan  [Lorazepam ] Anxiety and Other (See Comments)    Hallucinations   Nsaids Other (See Comments)    Hx of esophageal ulcer   Current Outpatient Medications on File Prior to Visit  Medication Sig Dispense Refill   amLODipine  (NORVASC ) 10 MG tablet Take 1 tablet (10 mg total) by mouth daily. 90 tablet 0   feeding supplement (ENSURE ENLIVE / ENSURE PLUS) LIQD Take 237 mLs by mouth 3 (three) times daily between meals. (Patient not taking: Reported on 01/15/2024) 10000 mL 0   mirtazapine  (REMERON ) 7.5 MG tablet Take 1 tablet (7.5 mg total) by mouth at bedtime. For anxiety and to help sleep (Patient not taking: Reported on 01/15/2024) 30 tablet 0   Multiple Vitamin (MULTIVITAMIN WITH MINERALS) TABS tablet Take 1 tablet by mouth daily. (Patient not taking: Reported on 01/15/2024) 30 tablet 0  pantoprazole  (PROTONIX ) 40 MG tablet Take 1 tablet (40 mg total) by mouth 2 (two) times daily before a meal. (Patient not taking: Reported on 01/15/2024) 60 tablet 3   prazosin  (MINIPRESS ) 1 MG capsule Take 1 capsule (1 mg total) by  mouth at bedtime. To prevent nightmares (Patient not taking: Reported on 01/15/2024) 30 capsule 0   sertraline  (ZOLOFT ) 50 MG tablet Take 1 tablet (50 mg total) by mouth daily. For anxiety and depression (Patient not taking: Reported on 01/15/2024) 30 tablet 0   TYLENOL  8 HOUR ARTHRITIS PAIN 650 MG CR tablet Take 1,300 mg by mouth every 8 (eight) hours as needed (for arthritic pain). (Patient not taking: Reported on 01/15/2024)     No current facility-administered medications on file prior to visit.   Social History   Socioeconomic History   Marital status: Single    Spouse name: Not on file   Number of children: Not on file   Years of education: Not on file   Highest education level: Some college, no degree  Occupational History   Not on file  Tobacco Use   Smoking status: Former    Types: Cigarettes   Smokeless tobacco: Never   Tobacco comments:    About 1 cigarette per day or less  Vaping Use   Vaping status: Never Used  Substance and Sexual Activity   Alcohol use: Yes    Alcohol/week: 200.0 standard drinks of alcohol    Types: 200 Cans of beer per week   Drug use: Yes    Types: Marijuana   Sexual activity: Yes  Other Topics Concern   Not on file  Social History Narrative   Not on file   Social Drivers of Health   Financial Resource Strain: Medium Risk (12/25/2023)   Overall Financial Resource Strain (CARDIA)    Difficulty of Paying Living Expenses: Somewhat hard  Food Insecurity: No Food Insecurity (02/01/2024)   Hunger Vital Sign    Worried About Running Out of Food in the Last Year: Never true    Ran Out of Food in the Last Year: Never true  Recent Concern: Food Insecurity - Food Insecurity Present (12/15/2023)   Hunger Vital Sign    Worried About Running Out of Food in the Last Year: Never true    Ran Out of Food in the Last Year: Sometimes true  Transportation Needs: No Transportation Needs (01/08/2024)   PRAPARE - Administrator, Civil Service (Medical):  No    Lack of Transportation (Non-Medical): No  Physical Activity: Unknown (11/07/2018)   Exercise Vital Sign    Days of Exercise per Week: Patient declined    Minutes of Exercise per Session: Patient declined  Stress: Stress Concern Present (12/25/2023)   [employer] of Occupational Health - Occupational Stress Questionnaire    Feeling of Stress : Rather much  Social Connections: Moderately Isolated (09/16/2023)   Social Connection and Isolation Panel [NHANES]    Frequency of Communication with Friends and Family: More than three times a week    Frequency of Social Gatherings with Friends and Family: Never    Attends Religious Services: More than 4 times per year    Active Member of [employer] or Organizations: No    Attends Banker Meetings: Never    Marital Status: Never married  Intimate Partner Violence: Not At Risk (01/08/2024)   Humiliation, Afraid, Rape, and Kick questionnaire    Fear of Current or Ex-Partner: No    Emotionally Abused: No  Physically Abused: No    Sexually Abused: No   Family History  Problem Relation Age of Onset   Diabetes Mellitus II Father    Diabetes Mellitus II Other    CAD Other    Health Maintenance  Topic Date Due   OPHTHALMOLOGY EXAM  Never done   Diabetic kidney evaluation - Urine ACR  Never done   DTaP/Tdap/Td (1 - Tdap) Never done   Pneumococcal Vaccine 32-75 Years old (1 of 2 - PCV) Never done   FOOT EXAM  08/21/2020   Colonoscopy  Never done   COVID-19 Vaccine (3 - 2024-25 season) 07/05/2023   INFLUENZA VACCINE  06/03/2024   HEMOGLOBIN A1C  06/09/2024   Diabetic kidney evaluation - eGFR measurement  12/15/2024   Hepatitis C Screening  Completed   HIV Screening  Completed   HPV VACCINES  Aged Out   Meningococcal B Vaccine  Aged Out     OBJECTIVE:  Vitals:   03/02/24 1334  BP: 127/80  Pulse: 69  Resp: 16  SpO2: 100%  Weight: 239 lb 12.8 oz (108.8 kg)    Physical Exam Vitals reviewed.  Constitutional:       Appearance: Normal appearance.  HENT:     Head: Normocephalic.     Right Ear: Tympanic membrane and external ear normal.     Left Ear: Tympanic membrane and external ear normal.     Nose: Nose normal.  Eyes:     Extraocular Movements: Extraocular movements intact.     Pupils: Pupils are equal, round, and reactive to light.  Cardiovascular:     Rate and Rhythm: Normal rate and regular rhythm.  Pulmonary:     Effort: Pulmonary effort is normal.     Breath sounds: Normal breath sounds.  Abdominal:     General: Bowel sounds are normal. There is distension.     Palpations: Abdomen is soft.  Musculoskeletal:        General: Normal range of motion.     Cervical back: Normal range of motion.     Comments: Gouty arthritis elbows and fingers  Skin:    General: Skin is warm and dry.  Neurological:     Mental Status: He is alert and oriented to person, place, and time.  Psychiatric:        Mood and Affect: Mood normal.        Behavior: Behavior normal.        Thought Content: Thought content normal.        Judgment: Judgment normal.      ROS  Last 3 Office BP readings: BP Readings from Last 3 Encounters:  03/02/24 127/80  01/15/24 (!) 148/78  12/16/23 136/88    BMET    Component Value Date/Time   NA 131 (L) 12/16/2023 0433   NA 140 07/16/2020 1553   K 3.4 (L) 12/16/2023 0433   CL 96 (L) 12/16/2023 0433   CO2 22 12/16/2023 0433   GLUCOSE 136 (H) 12/16/2023 0433   BUN 13 12/16/2023 0433   BUN 5 (L) 07/16/2020 1553   CREATININE 0.98 12/16/2023 0433   CALCIUM  8.6 (L) 12/16/2023 0433   GFRNONAA >60 12/16/2023 0433   GFRAA 132 07/16/2020 1553    Renal function: CrCl cannot be calculated (Patient's most recent lab result is older than the maximum 21 days allowed.).  Clinical ASCVD: No  The ASCVD Risk score (Arnett DK, et al., 2019) failed to calculate for the following reasons:   Cannot find a previous HDL  lab   Cannot find a previous total cholesterol lab  ASCVD risk  factors include- [place]   ASSESSMENT & PLAN: Richard Lopez was seen today for blood pressure check.  Diagnoses and all orders for this visit:  Nausea and vomiting, unspecified vomiting type -     ondansetron  (ZOFRAN ) 4 MG tablet; Take 1 tablet (4 mg total) by mouth every 8 (eight) hours as needed for nausea or vomiting.  Other orders -     Multiple Vitamin (MULTIVITAMIN WITH MINERALS) TABS tablet; Take 1 tablet by mouth daily. -     pantoprazole  (PROTONIX ) 40 MG tablet; Take 2 tablets (80 mg total) by mouth daily. -     ondansetron  (ZOFRAN ) 4 MG tablet; Take 1 tablet (4 mg total) by mouth every 8 (eight) hours as needed for nausea or vomiting.   Essential hypertension -Counseled on lifestyle modifications for blood pressure control including reduced dietary sodium, increased exercise, weight reduction and adequate sleep. Also, educated patient about the risk for cardiovascular events, stroke and heart attack. Also counseled patient about the importance of medication adherence. If you participate in smoking, it is important to stop using tobacco as this will increase the risks associated with uncontrolled blood pressure.  Goal BP:  For patients younger than 60: Goal BP < 130/80. For patients 60 and older: Goal BP < 140/90. For patients with diabetes: Goal BP < 130/80. Your most recent BP: 127/80  Minimize salt intake. Minimize alcohol intake  Other orders -     Multiple Vitamin (MULTIVITAMIN WITH MINERALS) TABS tablet; Take 1 tablet by mouth daily. -     ondansetron  (ZOFRAN ) 4 MG tablet; Take 1 tablet (4 mg total) by mouth every 8 (eight) hours as needed for nausea or vomiting.     This note has been created with Education officer, environmental. Any transcriptional errors are unintentional.   Marius Siemens, NP 03/02/2024, 1:57 PM

## 2024-03-16 ENCOUNTER — Encounter (HOSPITAL_COMMUNITY): Payer: Self-pay | Admitting: Emergency Medicine

## 2024-03-16 ENCOUNTER — Inpatient Hospital Stay (HOSPITAL_COMMUNITY)
Admission: EM | Admit: 2024-03-16 | Discharge: 2024-03-18 | DRG: 392 | Disposition: A | Discharge status: Home / Self Care | Attending: Internal Medicine | Admitting: Internal Medicine

## 2024-03-16 ENCOUNTER — Other Ambulatory Visit: Payer: Self-pay

## 2024-03-16 ENCOUNTER — Inpatient Hospital Stay (HOSPITAL_COMMUNITY)

## 2024-03-16 DIAGNOSIS — E871 Hypo-osmolality and hyponatremia: Secondary | ICD-10-CM | POA: Diagnosis present

## 2024-03-16 DIAGNOSIS — E878 Other disorders of electrolyte and fluid balance, not elsewhere classified: Secondary | ICD-10-CM | POA: Diagnosis not present

## 2024-03-16 DIAGNOSIS — F1210 Cannabis abuse, uncomplicated: Secondary | ICD-10-CM | POA: Diagnosis present

## 2024-03-16 DIAGNOSIS — Z833 Family history of diabetes mellitus: Secondary | ICD-10-CM | POA: Diagnosis not present

## 2024-03-16 DIAGNOSIS — N179 Acute kidney failure, unspecified: Secondary | ICD-10-CM | POA: Diagnosis present

## 2024-03-16 DIAGNOSIS — F1290 Cannabis use, unspecified, uncomplicated: Secondary | ICD-10-CM | POA: Diagnosis not present

## 2024-03-16 DIAGNOSIS — R112 Nausea with vomiting, unspecified: Secondary | ICD-10-CM | POA: Diagnosis not present

## 2024-03-16 DIAGNOSIS — G8929 Other chronic pain: Secondary | ICD-10-CM | POA: Diagnosis present

## 2024-03-16 DIAGNOSIS — M1990 Unspecified osteoarthritis, unspecified site: Secondary | ICD-10-CM | POA: Diagnosis present

## 2024-03-16 DIAGNOSIS — D696 Thrombocytopenia, unspecified: Secondary | ICD-10-CM | POA: Diagnosis present

## 2024-03-16 DIAGNOSIS — I10 Essential (primary) hypertension: Secondary | ICD-10-CM | POA: Diagnosis present

## 2024-03-16 DIAGNOSIS — E872 Acidosis, unspecified: Secondary | ICD-10-CM | POA: Diagnosis not present

## 2024-03-16 DIAGNOSIS — Z87891 Personal history of nicotine dependence: Secondary | ICD-10-CM | POA: Diagnosis not present

## 2024-03-16 DIAGNOSIS — E876 Hypokalemia: Secondary | ICD-10-CM | POA: Diagnosis present

## 2024-03-16 DIAGNOSIS — F1090 Alcohol use, unspecified, uncomplicated: Secondary | ICD-10-CM | POA: Diagnosis not present

## 2024-03-16 DIAGNOSIS — E119 Type 2 diabetes mellitus without complications: Secondary | ICD-10-CM | POA: Diagnosis present

## 2024-03-16 DIAGNOSIS — R1116 Cannabis hyperemesis syndrome: Secondary | ICD-10-CM | POA: Diagnosis present

## 2024-03-16 DIAGNOSIS — E860 Dehydration: Secondary | ICD-10-CM | POA: Diagnosis present

## 2024-03-16 DIAGNOSIS — E861 Hypovolemia: Secondary | ICD-10-CM | POA: Diagnosis present

## 2024-03-16 DIAGNOSIS — Z8249 Family history of ischemic heart disease and other diseases of the circulatory system: Secondary | ICD-10-CM

## 2024-03-16 LAB — CBC
HCT: 38.8 % — ABNORMAL LOW (ref 39.0–52.0)
Hemoglobin: 13.5 g/dL (ref 13.0–17.0)
MCH: 29.9 pg (ref 26.0–34.0)
MCHC: 34.8 g/dL (ref 30.0–36.0)
MCV: 86 fL (ref 80.0–100.0)
Platelets: 95 10*3/uL — ABNORMAL LOW (ref 150–400)
RBC: 4.51 MIL/uL (ref 4.22–5.81)
RDW: 15.3 % (ref 11.5–15.5)
WBC: 8.4 10*3/uL (ref 4.0–10.5)
nRBC: 0 % (ref 0.0–0.2)

## 2024-03-16 LAB — URINALYSIS, ROUTINE W REFLEX MICROSCOPIC
Bacteria, UA: NONE SEEN
Bilirubin Urine: NEGATIVE
Glucose, UA: NEGATIVE mg/dL
Ketones, ur: 5 mg/dL — AB
Leukocytes,Ua: NEGATIVE
Nitrite: NEGATIVE
Protein, ur: 100 mg/dL — AB
Specific Gravity, Urine: 1.008 (ref 1.005–1.030)
pH: 7 (ref 5.0–8.0)

## 2024-03-16 LAB — TROPONIN I (HIGH SENSITIVITY): Troponin I (High Sensitivity): 16 ng/L (ref <18)

## 2024-03-16 LAB — COMPREHENSIVE METABOLIC PANEL WITH GFR
ALT: 39 U/L (ref 0–44)
AST: 58 U/L — ABNORMAL HIGH (ref 15–41)
Albumin: 4.3 g/dL (ref 3.5–5.0)
Alkaline Phosphatase: 67 U/L (ref 38–126)
Anion gap: 17 — ABNORMAL HIGH (ref 5–15)
BUN: 16 mg/dL (ref 6–20)
CO2: 29 mmol/L (ref 22–32)
Calcium: 8.4 mg/dL — ABNORMAL LOW (ref 8.9–10.3)
Chloride: 82 mmol/L — ABNORMAL LOW (ref 98–111)
Creatinine, Ser: 1.09 mg/dL (ref 0.61–1.24)
GFR, Estimated: >60 mL/min (ref >60)
Glucose, Bld: 151 mg/dL — ABNORMAL HIGH (ref 70–99)
Potassium: 2.7 mmol/L — CL (ref 3.5–5.1)
Sodium: 128 mmol/L — ABNORMAL LOW (ref 135–145)
Total Bilirubin: 2.1 mg/dL — ABNORMAL HIGH (ref 0.0–1.2)
Total Protein: 8 g/dL (ref 6.5–8.1)

## 2024-03-16 LAB — BASIC METABOLIC PANEL WITH GFR
Anion gap: 14 (ref 5–15)
BUN: 14 mg/dL (ref 6–20)
CO2: 28 mmol/L (ref 22–32)
Calcium: 7.9 mg/dL — ABNORMAL LOW (ref 8.9–10.3)
Chloride: 85 mmol/L — ABNORMAL LOW (ref 98–111)
Creatinine, Ser: 0.81 mg/dL (ref 0.61–1.24)
GFR, Estimated: >60 mL/min (ref >60)
Glucose, Bld: 142 mg/dL — ABNORMAL HIGH (ref 70–99)
Potassium: 3 mmol/L — ABNORMAL LOW (ref 3.5–5.1)
Sodium: 127 mmol/L — ABNORMAL LOW (ref 135–145)

## 2024-03-16 LAB — LIPASE, BLOOD: Lipase: 27 U/L (ref 11–51)

## 2024-03-16 LAB — MAGNESIUM: Magnesium: 1.2 mg/dL — ABNORMAL LOW (ref 1.7–2.4)

## 2024-03-16 LAB — SODIUM, URINE, RANDOM: Sodium, Ur: 27 mmol/L

## 2024-03-16 LAB — ETHANOL: Alcohol, Ethyl (B): <15 mg/dL (ref <15)

## 2024-03-16 MED ORDER — POLYETHYLENE GLYCOL 3350 17 G PO PACK: 17.0000 g | PACK | Freq: Every day | ORAL | Status: DC | PRN

## 2024-03-16 MED ORDER — DEXTROSE IN LACTATED RINGERS 5 % IV SOLN: INTRAVENOUS | Status: AC

## 2024-03-16 MED ORDER — PANTOPRAZOLE SODIUM 40 MG IV SOLR
40.0000 mg | INTRAVENOUS | Status: DC
  Administered 2024-03-16 – 2024-03-17 (×2): 40 mg via INTRAVENOUS
  Filled 2024-03-16: qty 10

## 2024-03-16 MED ORDER — SODIUM CHLORIDE 0.9 % IV BOLUS
1000.0000 mL | Freq: Once | INTRAVENOUS | Status: AC
  Administered 2024-03-16: 1000 mL via INTRAVENOUS

## 2024-03-16 MED ORDER — ACETAMINOPHEN 325 MG PO TABS: 650.0000 mg | ORAL_TABLET | Freq: Four times a day (QID) | ORAL | Status: DC | PRN

## 2024-03-16 MED ORDER — POTASSIUM CHLORIDE CRYS ER 20 MEQ PO TBCR
40.0000 meq | EXTENDED_RELEASE_TABLET | Freq: Once | ORAL | Status: AC
  Administered 2024-03-16: 40 meq via ORAL
  Filled 2024-03-16: qty 2

## 2024-03-16 MED ORDER — METOCLOPRAMIDE HCL 5 MG/ML IJ SOLN
10.0000 mg | Freq: Once | INTRAMUSCULAR | Status: AC
  Administered 2024-03-16: 10 mg via INTRAVENOUS
  Filled 2024-03-16: qty 2

## 2024-03-16 MED ORDER — ACETAMINOPHEN 650 MG RE SUPP: 650.0000 mg | Freq: Four times a day (QID) | RECTAL | Status: DC | PRN

## 2024-03-16 MED ORDER — ONDANSETRON HCL 4 MG/2ML IJ SOLN
4.0000 mg | Freq: Four times a day (QID) | INTRAMUSCULAR | Status: DC | PRN
Start: 2024-03-16 — End: 2024-03-18
  Administered 2024-03-17: 4 mg via INTRAVENOUS
  Filled 2024-03-16: qty 2

## 2024-03-16 MED ORDER — ENOXAPARIN SODIUM 40 MG/0.4ML IJ SOSY
40.0000 mg | PREFILLED_SYRINGE | INTRAMUSCULAR | Status: DC
  Administered 2024-03-17: 40 mg via SUBCUTANEOUS
  Filled 2024-03-16: qty 0.4

## 2024-03-16 MED ORDER — HYDROXYZINE HCL 25 MG PO TABS: 25.0000 mg | ORAL_TABLET | Freq: Once | ORAL | Status: DC

## 2024-03-16 MED ORDER — DIPHENHYDRAMINE HCL 50 MG/ML IJ SOLN
12.5000 mg | Freq: Once | INTRAMUSCULAR | Status: AC
  Administered 2024-03-16: 12.5 mg via INTRAVENOUS
  Filled 2024-03-16: qty 1

## 2024-03-16 MED ORDER — POTASSIUM CHLORIDE 20 MEQ PO PACK
60.0000 meq | PACK | Freq: Once | ORAL | Status: AC
  Administered 2024-03-16: 60 meq via ORAL
  Filled 2024-03-16: qty 3

## 2024-03-16 MED ORDER — MAGNESIUM SULFATE 2 GM/50ML IV SOLN
2.0000 g | Freq: Once | INTRAVENOUS | Status: AC
  Administered 2024-03-16: 2 g via INTRAVENOUS
  Filled 2024-03-16: qty 50

## 2024-03-16 MED ORDER — POTASSIUM CHLORIDE 10 MEQ/100ML IV SOLN
10.0000 meq | INTRAVENOUS | Status: AC
  Administered 2024-03-16 (×4): 10 meq via INTRAVENOUS
  Filled 2024-03-16 (×4): qty 100

## 2024-03-16 MED ORDER — TRAZODONE HCL 50 MG PO TABS
50.0000 mg | ORAL_TABLET | Freq: Once | ORAL | Status: AC
  Administered 2024-03-16: 50 mg via ORAL
  Filled 2024-03-16: qty 1

## 2024-03-16 MED ORDER — SODIUM CHLORIDE 0.9 % IV SOLN: INTRAVENOUS | Status: DC

## 2024-03-16 MED ORDER — AMLODIPINE BESYLATE 10 MG PO TABS
10.0000 mg | ORAL_TABLET | Freq: Every day | ORAL | Status: DC
  Administered 2024-03-16 – 2024-03-18 (×3): 10 mg via ORAL
  Filled 2024-03-16 (×3): qty 1

## 2024-03-16 MED ORDER — SODIUM CHLORIDE 0.9 % IV SOLN
12.5000 mg | Freq: Four times a day (QID) | INTRAVENOUS | Status: DC | PRN
  Administered 2024-03-16: 12.5 mg via INTRAVENOUS
  Filled 2024-03-16: qty 0.5

## 2024-03-16 MED ORDER — SODIUM CHLORIDE 0.9% FLUSH
3.0000 mL | Freq: Two times a day (BID) | INTRAVENOUS | Status: DC
  Administered 2024-03-16 (×2): 3 mL via INTRAVENOUS

## 2024-03-16 NOTE — Assessment & Plan Note (Signed)
 Patient advises that he is currently not using alcohol anymore.  Will monitor clinically for any withdrawal symptoms

## 2024-03-16 NOTE — ED Notes (Signed)
 Pt started vomiting again, one episode, and hiccups. Pt report throat pain and LUQ abd pain.

## 2024-03-16 NOTE — ED Provider Notes (Cosign Needed Addendum)
 [place] EMERGENCY DEPARTMENT AT [facility] Provider Note   CSN: [id number] Arrival date & time: 03/16/24  0548     History  Chief Complaint  Patient presents with   Emesis    Richard Lopez is a 48 y.o. male.  Patient complains of vomiting and nausea.  Patient reports that he has been vomiting since Sunday.  Patient reports he has been told that he is type II diabetic but he is not on any medications.  Patient admits to marijuana use.  Patient reports that he has severe arthritis and pain secondary to femur fracture and multiple surgeries.  Patient states that the marijuana is the only thing that helps with his pain.  Patient reports he has high blood pressure and chronic stomach discomfort.  He is currently on Protonix .  He reports no relief with Protonix .  Patient denies any blood in the vomit.  He has not had any black or tarry looking stools.  Patient reports he does not drink he has not been around anyone who has been sick.  Patient denies any questionable food intake.  The history is provided by the patient. No language interpreter was used.  Emesis      Home Medications Prior to Admission medications   Medication Sig Start Date End Date Taking? Authorizing Provider  amLODipine  (NORVASC ) 10 MG tablet Take 1 tablet (10 mg total) by mouth daily. 12/16/23 03/16/24 Yes Unk Garb, DO  Multiple Vitamin (MULTIVITAMIN WITH MINERALS) TABS tablet Take 1 tablet by mouth daily. 03/02/24  Yes Marius Siemens, NP  ondansetron  (ZOFRAN ) 4 MG tablet Take 1 tablet (4 mg total) by mouth every 8 (eight) hours as needed for nausea or vomiting. 03/02/24  Yes Marius Siemens, NP  pantoprazole  (PROTONIX ) 40 MG tablet Take 2 tablets (80 mg total) by mouth daily. Patient taking differently: Take 40 mg by mouth daily. 03/02/24  Yes Marius Siemens, NP      Allergies    Ativan  [lorazepam ] and Nsaids    Review of Systems   Review of Systems  Gastrointestinal:  Positive for  vomiting.  All other systems reviewed and are negative.   Physical Exam Updated Vital Signs BP (!) 139/90 (BP Location: Right Arm)   Pulse 96   Temp 98.3 F (36.8 C) (Oral)   Resp (!) 21   SpO2 100%  Physical Exam Vitals and nursing note reviewed.  Constitutional:      Appearance: He is well-developed.  HENT:     Head: Normocephalic.     Mouth/Throat:     Mouth: Mucous membranes are moist.  Cardiovascular:     Rate and Rhythm: Normal rate and regular rhythm.  Pulmonary:     Effort: Pulmonary effort is normal.  Abdominal:     General: There is no distension.  Musculoskeletal:        General: Normal range of motion.     Cervical back: Normal range of motion and neck supple.  Skin:    General: Skin is warm.  Neurological:     General: No focal deficit present.     Mental Status: He is alert and oriented to person, place, and time.     ED Results / Procedures / Treatments   Labs (all labs ordered are listed, but only abnormal results are displayed) Labs Reviewed  COMPREHENSIVE METABOLIC PANEL WITH GFR - Abnormal; Notable for the following components:      Result Value   Sodium 128 (*)    Potassium  2.7 (*)    Chloride 82 (*)    Glucose, Bld 151 (*)    Calcium  8.4 (*)    AST 58 (*)    Total Bilirubin 2.1 (*)    Anion gap 17 (*)    All other components within normal limits  CBC - Abnormal; Notable for the following components:   HCT 38.8 (*)    Platelets 95 (*)    All other components within normal limits  URINALYSIS, ROUTINE W REFLEX MICROSCOPIC - Abnormal; Notable for the following components:   Hgb urine dipstick MODERATE (*)    Ketones, ur 5 (*)    Protein, ur 100 (*)    All other components within normal limits  LIPASE, BLOOD  ETHANOL    EKG EKG Interpretation Date/Time:  Wednesday Mar 16 2024 07:39:17 EDT Ventricular Rate:  91 PR Interval:  131 QRS Duration:  94 QT Interval:  408 QTC Calculation: 502 R Axis:   67  Text Interpretation: Sinus  tachycardia Paired ventricular premature complexes Anterior infarct, old Prolonged QT interval similar to prior earlier today Confirmed by Russella Courts ([id number]) on 03/16/2024 11:29:12 AM  Radiology No results found.  Procedures .Critical Care  Performed by: Sandi Crosby, PA-C Authorized by: Sandi Crosby, PA-C   Critical care provider statement:    Critical care time (minutes):  30   Critical care start time:  03/16/2024 7:00 AM   Critical care end time:  03/16/2024 4:08 PM   Critical care time was exclusive of:  Separately billable procedures and treating other patients   Critical care was time spent personally by me on the following activities:  Blood draw for specimens, discussions with consultants, ordering and performing treatments and interventions, ordering and review of laboratory studies, ordering and review of radiographic studies and re-evaluation of patient's condition   Care discussed with: admitting provider       Medications Ordered in ED Medications  sodium chloride  0.9 % bolus 1,000 mL (0 mLs Intravenous Stopped 03/16/24 1500)  metoCLOPramide  (REGLAN ) injection 10 mg (10 mg Intravenous Given 03/16/24 0656)  diphenhydrAMINE  (BENADRYL ) injection 12.5 mg (12.5 mg Intravenous Given 03/16/24 0659)  potassium chloride  10 mEq in 100 mL IVPB (0 mEq Intravenous Stopped 03/16/24 1500)  potassium chloride  SA (KLOR-CON  M) CR tablet 40 mEq (40 mEq Oral Given 03/16/24 0749)  magnesium  sulfate IVPB 2 g 50 mL (0 g Intravenous Stopped 03/16/24 1500)    ED Course/ Medical Decision Making/ A&P Clinical Course as of 03/16/24 1521  Wed Mar 16, 2024  0823 Potassium(!!): 2.7 replace [SG]    Clinical Course User Index [SG] [name], DO                                 Medical Decision Making Patient complains of nausea and vomiting.  He has been unable to eat since Sunday  Amount and/or Complexity of Data Reviewed Labs: ordered. Decision-making details documented in ED Course.     Details: Labs ordered reviewed and interpreted.  Patient has a sodium of 128 potassium is 2.7.  Total bilirubin is 2.1.  CBC patient has a normal hemoglobin normal white blood cell count. Discussion of management or test interpretation with external provider(s): Hospitalist will see for admission.  Risk Prescription drug management. Decision regarding hospitalization. Risk Details: Patient given IV fluids.  Potassium is low patient is given 4 rounds of potassium.  Patient is having less frequent vomiting.  Patient given fluid challenge she is able to drink a small amount of fluid and eat a cracker.  Patient reports instant nausea and gagging.  Unable to tolerate further fluids.  Patient reexamined abdomen is minimally tender.  Patient does not feel well enough to go home.  Hospitalist consulted for admission           Final Clinical Impression(s) / ED Diagnoses Final diagnoses:  Nausea and vomiting, unspecified vomiting type  Hypokalemia    Rx / DC Orders ED Discharge Orders     None         Sandi Crosby, PA-C 03/16/24 1545    Sandi Crosby, PA-C 03/16/24 1608    [name], DO 03/17/24 [phone]

## 2024-03-16 NOTE — Assessment & Plan Note (Signed)
 This is severe, hypomangesium,  iv and PO replacement ordered. telmetry

## 2024-03-16 NOTE — Assessment & Plan Note (Signed)
 This is moderate, chronic, asymptomatic.  Likely prerenal.  Check urine sodium.  At this time continue with IV hydration to replace any volume loss and vomiting.trend

## 2024-03-16 NOTE — Assessment & Plan Note (Addendum)
 This has been a known issue in the past.  Patient aware of relationship of marijuana with hyperemesis, trying to quit.  At this time patient x-ray abdomen is within normal limits abdominal exam is benign.  Will check H. pylori for gastritis, empiric pantoprazole  treatment patient does not use alcohol or otherwise smoke besides marijuana.  Will treat with IV fluids, Zofran .  Patient is also having some hiccups will treat with Thorazine. Advance diet in AM as above. Check trop

## 2024-03-16 NOTE — ED Notes (Signed)
 Pt reports no n/v since meds were given

## 2024-03-16 NOTE — Assessment & Plan Note (Signed)
 With amlodipine 

## 2024-03-16 NOTE — ED Notes (Signed)
Pt vomited after drinking water 

## 2024-03-16 NOTE — ED Triage Notes (Signed)
 Pt reports emesis since Sunday night. Hx gastritis. Can't keep water down any more, throat burns from vomit. Having cramps in legs now.

## 2024-03-16 NOTE — Assessment & Plan Note (Signed)
 New and incidental not associated with anemia or leukopenia.  Check B12 folate level.  Check HIV in the morning.  Check immature platelet fraction.  Patient noted to have slight LFT elevation, will check liver ultrasound

## 2024-03-16 NOTE — H&P (Addendum)
 History and Physical    Patient: Richard Lopez ZOX:[id number] DOB: 1976-05-16 DOA: 03/16/2024 DOS: the patient was seen and examined on 03/16/2024 PCP: Marius Siemens, NP  Patient coming from: Home  Chief Complaint:  Chief Complaint  Patient presents with   Emesis   HPI: Richard Lopez is a 48 y.o. male with medical history significant of recall issues as listed below including patient having chronic pain due to prior orthopedic procedures for which patient uses occasional marijuana.  Patient was in his usual state of health till approximately 3 days ago when he reports a new onset of nausea and vomiting.  Patient does not report any diarrhea or any abdominal pain except for when he would vomit.  No fever.  But patient's inability to tolerate p.o. diet and liquid was complete.  Any inability/attempt to drink fluid was followed by vomiting.  Patient reports not having had anything to eat or drink since Sunday which was 3 days ago.  Patient came to the ER due to above complaints, attempts at p.o. challenge in the ER failed.  Medical evaluation is sought.  Patient is transferred [facility] inpatient unit.  Patient is still not feeling hungry and is nauseous.  Has not yet tolerated liquids.  Patient has been started on IV fluids. Found to have severe hypokalemia Review of Systems: As mentioned in the history of present illness. All other systems reviewed and are negative. Past Medical History:  Diagnosis Date   Alcohol dependence with intoxication (HCC) 03/25/2019   Anxiety    Arthritis    hands and possibly knee   Atrial fibrillation with RVR (HCC) 04/22/2020   Closed right hip fracture, initial encounter (HCC) 03/25/2019   Depression    Diabetes mellitus    diet controlled   Esophagitis determined by endoscopy 08/21/2019   Essential hypertension    Gastritis    Gout    Intertrochanteric fracture of right hip (HCC) 04/14/2019   Normocytic anemia due to blood loss 08/21/2019    Pulmonary embolism [facility])    Past Surgical History:  Procedure Laterality Date   BIOPSY  11/12/2022   Procedure: BIOPSY;  Surgeon: Genell Ken, MD;  Location: [facility];  Service: Gastroenterology;;   ESOPHAGOGASTRODUODENOSCOPY (EGD) WITH PROPOFOL  N/A 08/14/2019   Procedure: ESOPHAGOGASTRODUODENOSCOPY (EGD) WITH PROPOFOL ;  Surgeon: Baldo Bonds, MD;  Location: [facility];  Service: Endoscopy;  Laterality: N/A;   ESOPHAGOGASTRODUODENOSCOPY (EGD) WITH PROPOFOL  N/A 11/12/2022   Procedure: ESOPHAGOGASTRODUODENOSCOPY (EGD) WITH PROPOFOL ;  Surgeon: Genell Ken, MD;  Location: [facility];  Service: Gastroenterology;  Laterality: N/A;   EXTERNAL FIXATION LEG Left 11/07/2018   Procedure: EXTERNAL FIXATION LEFT LOWER LEG;  Surgeon: Adonica Hoose, MD;  Location: [facility] ORS;  Service: Orthopedics;  Laterality: Left;   EXTERNAL FIXATION REMOVAL Left 11/08/2018   Procedure: REMOVAL EXTERNAL FIXATION LEG;  Surgeon: Laneta Pintos, MD;  Location: [facility] OR;  Service: Orthopedics;  Laterality: Left;   INTRAMEDULLARY (IM) NAIL INTERTROCHANTERIC Right 03/26/2019   Procedure: INTRAMEDULLARY (IM) NAIL INTERTROCHANTRIC;  Surgeon: Laneta Pintos, MD;  Location: [facility] OR;  Service: Orthopedics;  Laterality: Right;   INTRAMEDULLARY (IM) NAIL INTERTROCHANTERIC Right 05/17/2019   Procedure: Intramedullary (Im) Nail Intertroch with circlage wiring;  Surgeon: Hardy Lia, MD;  Location: [facility] OR;  Service: Orthopedics;  Laterality: Right;   NO PAST SURGERIES     OPEN REDUCTION INTERNAL FIXATION (ORIF) TIBIA/FIBULA FRACTURE Left 11/08/2018   Procedure: OPEN REDUCTION INTERNAL FIXATION (ORIF) TIBIA/FIBULA FRACTURE;  Surgeon: Laneta Pintos, MD;  Location: [facility] OR;  Service: Orthopedics;  Laterality: Left;   ORIF FEMUR FRACTURE Right 05/17/2019   Procedure: REMOVAL  OF HARDWARE;  Surgeon: Hardy Lia, MD;  Location: [facility] OR;  Service: Orthopedics;  Laterality: Right;   Social History:  reports that he has quit smoking. His smoking  use included cigarettes. He has never used smokeless tobacco. He reports current alcohol use of about 200.0 standard drinks of alcohol per week. He reports current drug use. Drug: Marijuana.  Allergies  Allergen Reactions   Ativan  [Lorazepam ] Anxiety and Other (See Comments)    Hallucinations   Nsaids Other (See Comments)    Hx of esophageal ulcer    Family History  Problem Relation Age of Onset   Diabetes Mellitus II Father    Diabetes Mellitus II Other    CAD Other     Prior to Admission medications   Medication Sig Start Date End Date Taking? Authorizing Provider  amLODipine  (NORVASC ) 10 MG tablet Take 1 tablet (10 mg total) by mouth daily. 12/16/23 03/16/24 Yes Unk Garb, DO  Multiple Vitamin (MULTIVITAMIN WITH MINERALS) TABS tablet Take 1 tablet by mouth daily. 03/02/24  Yes Marius Siemens, NP  ondansetron  (ZOFRAN ) 4 MG tablet Take 1 tablet (4 mg total) by mouth every 8 (eight) hours as needed for nausea or vomiting. 03/02/24  Yes Marius Siemens, NP  pantoprazole  (PROTONIX ) 40 MG tablet Take 2 tablets (80 mg total) by mouth daily. Patient taking differently: Take 40 mg by mouth daily. 03/02/24  Yes Marius Siemens, NP    Physical Exam: Vitals:   03/16/24 0600 03/16/24 1040 03/16/24 1444 03/16/24 1636  BP: (!) 139/114 (!) 165/98 (!) 139/90 (!) 133/92  Pulse: 90 81 96 90  Resp: 18 19 (!) 21 16  Temp: 97.9 F (36.6 C) 98.3 F (36.8 C) 98.3 F (36.8 C) 98 F (36.7 C)  TempSrc: Oral Oral Oral Oral  SpO2: 100% 97% 100% 99%  Weight:    101.2 kg   General: Patient is alert and awake, appears to be in no immediate distress.  Gives a coherent account of his symptoms Respiratory exam: Bilateral intravesicular Cardiovascular exam S1-S2 normal Abdomen all quadrants soft nontender Extremities warm without edema no focal deficit Data Reviewed:  Labs on Admission:  Results for orders placed or performed during the hospital encounter of 03/16/24 (from the past 24 hours)   Lipase, blood     Status: None   Collection Time: 03/16/24  6:00 AM  Result Value Ref Range   Lipase 27 11 - 51 U/L  Comprehensive metabolic panel     Status: Abnormal   Collection Time: 03/16/24  6:00 AM  Result Value Ref Range   Sodium 128 (L) 135 - 145 mmol/L   Potassium 2.7 (LL) 3.5 - 5.1 mmol/L   Chloride 82 (L) 98 - 111 mmol/L   CO2 29 22 - 32 mmol/L   Glucose, Bld 151 (H) 70 - 99 mg/dL   BUN 16 6 - 20 mg/dL   Creatinine, Ser 6.04 0.61 - 1.24 mg/dL   Calcium  8.4 (L) 8.9 - 10.3 mg/dL   Total Protein 8.0 6.5 - 8.1 g/dL   Albumin  4.3 3.5 - 5.0 g/dL   AST 58 (H) 15 - 41 U/L   ALT 39 0 - 44 U/L   Alkaline Phosphatase 67 38 - 126 U/L   Total Bilirubin 2.1 (H) 0.0 - 1.2 mg/dL   GFR, Estimated >54 >09 mL/min   Anion gap 17 (H) 5 -  15  CBC     Status: Abnormal   Collection Time: 03/16/24  6:00 AM  Result Value Ref Range   WBC 8.4 4.0 - 10.5 K/uL   RBC 4.51 4.22 - 5.81 MIL/uL   Hemoglobin 13.5 13.0 - 17.0 g/dL   HCT 40.9 (L) 81.1 - 91.4 %   MCV 86.0 80.0 - 100.0 fL   MCH 29.9 26.0 - 34.0 pg   MCHC 34.8 30.0 - 36.0 g/dL   RDW 78.2 95.6 - 21.3 %   Platelets 95 (L) 150 - 400 K/uL   nRBC 0.0 0.0 - 0.2 %  Urinalysis, Routine w reflex microscopic -Urine, Clean Catch     Status: Abnormal   Collection Time: 03/16/24  8:43 AM  Result Value Ref Range   Color, Urine YELLOW YELLOW   APPearance CLEAR CLEAR   Specific Gravity, Urine 1.008 1.005 - 1.030   pH 7.0 5.0 - 8.0   Glucose, UA NEGATIVE NEGATIVE mg/dL   Hgb urine dipstick MODERATE (A) NEGATIVE   Bilirubin Urine NEGATIVE NEGATIVE   Ketones, ur 5 (A) NEGATIVE mg/dL   Protein, ur 086 (A) NEGATIVE mg/dL   Nitrite NEGATIVE NEGATIVE   Leukocytes,Ua NEGATIVE NEGATIVE   RBC / HPF 0-5 0 - 5 RBC/hpf   WBC, UA 0-5 0 - 5 WBC/hpf   Bacteria, UA NONE SEEN NONE SEEN   Squamous Epithelial / HPF 0-5 0 - 5 /HPF   Mucus PRESENT   Ethanol     Status: None   Collection Time: 03/16/24  4:35 PM  Result Value Ref Range   Alcohol, Ethyl (B)  <15 <15 mg/dL  Basic metabolic panel with GFR     Status: Abnormal   Collection Time: 03/16/24  4:35 PM  Result Value Ref Range   Sodium 127 (L) 135 - 145 mmol/L   Potassium 3.0 (L) 3.5 - 5.1 mmol/L   Chloride 85 (L) 98 - 111 mmol/L   CO2 28 22 - 32 mmol/L   Glucose, Bld 142 (H) 70 - 99 mg/dL   BUN 14 6 - 20 mg/dL   Creatinine, Ser 5.78 0.61 - 1.24 mg/dL   Calcium  7.9 (L) 8.9 - 10.3 mg/dL   GFR, Estimated >46 >96 mL/min   Anion gap 14 5 - 15  Magnesium      Status: Abnormal   Collection Time: 03/16/24  4:35 PM  Result Value Ref Range   Magnesium  1.2 (L) 1.7 - 2.4 mg/dL   Basic Metabolic Panel: Recent Labs  Lab 03/16/24 0600 03/16/24 1635  NA 128* 127*  K 2.7* 3.0*  CL 82* 85*  CO2 29 28  GLUCOSE 151* 142*  BUN 16 14  CREATININE 1.09 0.81  CALCIUM  8.4* 7.9*  MG  --  1.2*   Liver Function Tests: Recent Labs  Lab 03/16/24 0600  AST 58*  ALT 39  ALKPHOS 67  BILITOT 2.1*  PROT 8.0  ALBUMIN  4.3   Recent Labs  Lab 03/16/24 0600  LIPASE 27   No results for input(s): "AMMONIA" in the last 168 hours. CBC: Recent Labs  Lab 03/16/24 0600  WBC 8.4  HGB 13.5  HCT 38.8*  MCV 86.0  PLT 95*   Cardiac Enzymes: No results for input(s): "CKTOTAL", "CKMB", "CKMBINDEX", "TROPONINIHS" in the last 168 hours.  BNP (last 3 results) No results for input(s): "PROBNP" in the last 8760 hours. CBG: No results for input(s): "GLUCAP" in the last 168 hours.  Radiological Exams on Admission:  DG Abd 2 Views Result Date:  03/16/2024 CLINICAL DATA:  Vomiting. EXAM: ABDOMEN - 2 VIEW COMPARISON:  October 22, 2020. FINDINGS: The bowel gas pattern is normal. There is no evidence of free air. No radio-opaque calculi or other significant radiographic abnormality is seen. IMPRESSION: Negative. Electronically Signed   By: Rosalene Colon M.D.   On: 03/16/2024 16:06      No intake/output data recorded. No intake/output data recorded.     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 This has been a known issue in the past.  Patient aware of relationship of marijuana with hyperemesis, trying to quit.  At this time patient x-ray abdomen is within normal limits abdominal exam is benign.  Will check H. pylori for gastritis, empiric pantoprazole  treatment patient does not use alcohol or otherwise smoke besides marijuana.  Will treat with IV fluids, Zofran .  Patient is also having some hiccups will treat with Thorazine. Advance diet in AM as above. Check trop  Hyponatremia This is moderate, chronic, asymptomatic.  Likely prerenal.  Check urine sodium.  At this time continue with IV hydration to replace any volume loss and vomiting.trend  Hypokalemia This is severe, hypomangesium,  iv and PO replacement ordered. telmetry  Alcohol use Patient advises that he is currently not using alcohol anymore.  Will monitor clinically for any withdrawal symptoms  Thrombocytopenia (HCC) New and incidental not associated with anemia or leukopenia.  Check B12 folate level.  Check HIV in the morning.  Check immature platelet fraction.  Patient noted to have slight LFT elevation, will check liver ultrasound  Essential hypertension With amlodipine        Advance Care Planning:   Code Status: Full Code   Consults: none at this time.  Family Communication: per pateint.  Severity of Illness: The appropriate patient status for this patient is INPATIENT. Inpatient status is judged to be reasonable and necessary in order to provide the required intensity of service to ensure the patient's safety. The patient's presenting symptoms, physical exam findings, and initial radiographic and laboratory data in the context of their chronic comorbidities is felt to place them at high risk for further clinical deterioration. Furthermore, it is not anticipated that the patient will be medically stable for discharge  from the hospital within 2 midnights of admission.   * I certify that at the point of admission it is my clinical judgment that the patient will require inpatient hospital care spanning beyond 2 midnights from the point of admission due to high intensity of service, high risk for further deterioration and high frequency of surveillance required.*  Author: Bennie Brave, MD 03/16/2024 6:34 PM  For on call review [email or web address].

## 2024-03-16 NOTE — ED Notes (Signed)
 Asked patient if he could give urine sample and he is unable to urinate at this time. Will try again in 30 minutes.

## 2024-03-17 DIAGNOSIS — E876 Hypokalemia: Secondary | ICD-10-CM | POA: Diagnosis not present

## 2024-03-17 DIAGNOSIS — F1290 Cannabis use, unspecified, uncomplicated: Secondary | ICD-10-CM

## 2024-03-17 DIAGNOSIS — E871 Hypo-osmolality and hyponatremia: Secondary | ICD-10-CM | POA: Diagnosis not present

## 2024-03-17 DIAGNOSIS — R112 Nausea with vomiting, unspecified: Secondary | ICD-10-CM | POA: Diagnosis not present

## 2024-03-17 DIAGNOSIS — F1090 Alcohol use, unspecified, uncomplicated: Secondary | ICD-10-CM | POA: Diagnosis not present

## 2024-03-17 LAB — CBC
HCT: 34.6 % — ABNORMAL LOW (ref 39.0–52.0)
Hemoglobin: 12 g/dL — ABNORMAL LOW (ref 13.0–17.0)
MCH: 30 pg (ref 26.0–34.0)
MCHC: 34.7 g/dL (ref 30.0–36.0)
MCV: 86.5 fL (ref 80.0–100.0)
Platelets: 75 10*3/uL — ABNORMAL LOW (ref 150–400)
RBC: 4 MIL/uL — ABNORMAL LOW (ref 4.22–5.81)
RDW: 15 % (ref 11.5–15.5)
WBC: 4.5 10*3/uL (ref 4.0–10.5)
nRBC: 0 % (ref 0.0–0.2)

## 2024-03-17 LAB — BASIC METABOLIC PANEL WITH GFR
Anion gap: 13 (ref 5–15)
Anion gap: 13 (ref 5–15)
BUN: 12 mg/dL (ref 6–20)
BUN: 9 mg/dL (ref 6–20)
CO2: 26 mmol/L (ref 22–32)
CO2: 25 mmol/L (ref 22–32)
Calcium: 7.9 mg/dL — ABNORMAL LOW (ref 8.9–10.3)
Calcium: 8.1 mg/dL — ABNORMAL LOW (ref 8.9–10.3)
Chloride: 90 mmol/L — ABNORMAL LOW (ref 98–111)
Chloride: 91 mmol/L — ABNORMAL LOW (ref 98–111)
Creatinine, Ser: 0.74 mg/dL (ref 0.61–1.24)
Creatinine, Ser: 0.59 mg/dL — ABNORMAL LOW (ref 0.61–1.24)
GFR, Estimated: >60 mL/min (ref >60)
GFR, Estimated: >60 mL/min (ref >60)
Glucose, Bld: 143 mg/dL — ABNORMAL HIGH (ref 70–99)
Glucose, Bld: 149 mg/dL — ABNORMAL HIGH (ref 70–99)
Potassium: 3 mmol/L — ABNORMAL LOW (ref 3.5–5.1)
Potassium: 3.2 mmol/L — ABNORMAL LOW (ref 3.5–5.1)
Sodium: 129 mmol/L — ABNORMAL LOW (ref 135–145)
Sodium: 129 mmol/L — ABNORMAL LOW (ref 135–145)

## 2024-03-17 LAB — PROTIME-INR
INR: 1.1 (ref 0.8–1.2)
Prothrombin Time: 14.2 s (ref 11.4–15.2)

## 2024-03-17 LAB — FOLATE: Folate: 12.6 ng/mL (ref >5.9)

## 2024-03-17 LAB — APTT: aPTT: 29 s (ref 24–36)

## 2024-03-17 LAB — VITAMIN B12: Vitamin B-12: 430 pg/mL (ref 180–914)

## 2024-03-17 LAB — MAGNESIUM: Magnesium: 1.3 mg/dL — ABNORMAL LOW (ref 1.7–2.4)

## 2024-03-17 LAB — IMMATURE PLATELET FRACTION: Immature Platelet Fraction: 10.3 % — ABNORMAL HIGH (ref 1.2–8.6)

## 2024-03-17 MED ORDER — MAGNESIUM SULFATE 2 GM/50ML IV SOLN: 2.0000 g | Freq: Once | INTRAVENOUS | Status: DC

## 2024-03-17 MED ORDER — POTASSIUM CHLORIDE 20 MEQ PO PACK
40.0000 meq | PACK | ORAL | Status: DC
  Filled 2024-03-17: qty 2

## 2024-03-17 MED ORDER — POTASSIUM CHLORIDE 10 MEQ/100ML IV SOLN
10.0000 meq | INTRAVENOUS | Status: AC
  Administered 2024-03-17 (×4): 10 meq via INTRAVENOUS
  Filled 2024-03-17 (×4): qty 100

## 2024-03-17 MED ORDER — MAGNESIUM SULFATE 4 GM/100ML IV SOLN
4.0000 g | Freq: Once | INTRAVENOUS | Status: AC
  Administered 2024-03-17: 4 g via INTRAVENOUS
  Filled 2024-03-17: qty 100

## 2024-03-17 MED ORDER — POTASSIUM CHLORIDE CRYS ER 20 MEQ PO TBCR
40.0000 meq | EXTENDED_RELEASE_TABLET | ORAL | Status: AC
  Administered 2024-03-17 (×2): 40 meq via ORAL
  Filled 2024-03-17 (×2): qty 2

## 2024-03-17 MED ORDER — SODIUM CHLORIDE 0.9 % IV BOLUS
500.0000 mL | Freq: Once | INTRAVENOUS | Status: AC
  Administered 2024-03-17: 500 mL via INTRAVENOUS

## 2024-03-17 NOTE — Plan of Care (Signed)

## 2024-03-17 NOTE — Progress Notes (Signed)
 PROGRESS NOTE    Richard Lopez  ZOX:[id number] DOB: [birth year] DOA: 03/16/2024 PCP: Marius Siemens, NP   Brief Narrative:  Richard Lopez is a 48 y.o. male with medical history including chronic pain due to prior orthopedic procedure, and ongoing chronic marijuana use/abuse and cannabinoid hyperemesis syndrome. Patient presents with intractable nausea vomiting consistent with his cannabinoid hyperemesis syndrome with ongoing use and abuse of marijuana.  Hospitalist called for admission given patient's inability to tolerate p.o. with profound electrolyte abnormalities.  Assessment & Plan:   Principal Problem:   Nausea with vomiting - Could be related to cannabis hyperemesis syndrome given pt's repeated use of marijuana. Active Problems:   Thrombocytopenia (HCC)   Alcohol use   Hypokalemia   Hyponatremia   Acute on chronic cannabis hyperemesis syndrome Intractable nausea with vomiting - Well documented in our system since 2022 - Imaging unremarkable - Continue IV fluids until p.o. intake is more appropriate, liberalize diet to encourage p.o. intake - In the interim symptomatic control with Zofran   AKI  - Baseline creatinine around 0.6 -creatinine at intake 1.1 -Resolved with IV fluids, see below - FeNa < 1% consistent with prerenal etiology consistent with hypovolemia as below  Hyponatremia, hypovolemic Hypokalemia Hypomagnesemia Anion gap metabolic acidosis - Hyponatremia appears to be somewhat chronic, remains asymptomatic - Hypokalemia and hypomagnesemia likely secondary to poor p.o. intake, replete as appropriate -Acidosis likely multifactorial in the setting of renal dysfunction, dehydration, hypochloremia - lactic not obtained prior to fluid resuscitation   Alcohol use/abuse Reports no ongoing use at this time -AST elevated in isolation   Thrombocytopenia (HCC) New and incidental not associated with anemia or leukopenia.  Check B12 folate level.  Check HIV in the  morning.  Check immature platelet fraction.  Patient noted to have slight LFT elevation, will check liver ultrasound   Essential hypertension With amlodipine   DVT prophylaxis: enoxaparin  (LOVENOX ) injection 40 mg Start: 03/17/24 1000 SCDs Start: 03/16/24 1543   Code Status:   Code Status: Full Code  Family Communication: None present  Status is: Inpatient  Dispo: The patient is from: Home              Anticipated d/c is to: Home              Anticipated d/c date is: 24 to 48 hours              Patient currently not medically stable for discharge  Consultants:  None  Procedures:  None  Antimicrobials:  None indicated  Subjective: No acute issues or events overnight, patient reports ongoing nausea but vomiting has improved.  Otherwise denies headache fever chills chest pain shortness of breath  Objective: Vitals:   03/16/24 1636 03/16/24 1932 03/16/24 2345 03/17/24 0352  BP: (!) 133/92 (!) 149/97 138/75 (!) 173/85  Pulse: 90 90 89 90  Resp: 16 20 18 20   Temp: 98 F (36.7 C) 98.2 F (36.8 C) 97.9 F (36.6 C) (!) 97.4 F (36.3 C)  TempSrc: Oral  Oral Oral  SpO2: 99% 100% 98% 96%  Weight: 101.2 kg       Intake/Output Summary (Last 24 hours) at 03/17/2024 0730 Last data filed at 03/17/2024 0700 Gross per 24 hour  Intake --  Output 2950 ml  Net -2950 ml   Filed Weights   03/16/24 1636  Weight: 101.2 kg    Examination:  General:  Pleasantly resting in bed, No acute distress. HEENT:  Normocephalic atraumatic.  Sclerae nonicteric, noninjected.  Extraocular  movements intact bilaterally. Neck:  Without mass or deformity.  Trachea is midline. Lungs:  Clear to auscultate bilaterally without rhonchi, wheeze, or rales. Heart: Tachycardic regular rhythm.  Without murmurs, rubs, or gallops. Abdomen: Mild tenderness to palpate diffusely without PMI, nondistended. Extremities: Without cyanosis, clubbing, edema, or obvious deformity.  Data Reviewed: I have personally  reviewed following labs and imaging studies  CBC: Recent Labs  Lab 03/16/24 0600 03/17/24 0544  WBC 8.4 4.5  HGB 13.5 12.0*  HCT 38.8* 34.6*  MCV 86.0 86.5  PLT 95* 75*   Basic Metabolic Panel: Recent Labs  Lab 03/16/24 0600 03/16/24 1635 03/17/24 0025 03/17/24 0544  NA 128* 127* 129* 129*  K 2.7* 3.0* 3.0* 3.2*  CL 82* 85* 90* 91*  CO2 29 28 26 25   GLUCOSE 151* 142* 143* 149*  BUN 16 14 12 9   CREATININE 1.09 0.81 0.74 0.59*  CALCIUM  8.4* 7.9* 7.9* 8.1*  MG  --  1.2*  --   --    GFR: Estimated Creatinine Clearance: 149.2 mL/min (A) (by C-G formula based on SCr of 0.59 mg/dL (L)). Liver Function Tests: Recent Labs  Lab 03/16/24 0600  AST 58*  ALT 39  ALKPHOS 67  BILITOT 2.1*  PROT 8.0  ALBUMIN  4.3   Recent Labs  Lab 03/16/24 0600  LIPASE 27   No results for input(s): "AMMONIA" in the last 168 hours. Coagulation Profile: Recent Labs  Lab 03/17/24 0544  INR 1.1   Cardiac Enzymes: No results for input(s): "CKTOTAL", "CKMB", "CKMBINDEX", "TROPONINI" in the last 168 hours. BNP (last 3 results) No results for input(s): "PROBNP" in the last 8760 hours. HbA1C: No results for input(s): "HGBA1C" in the last 72 hours. CBG: No results for input(s): "GLUCAP" in the last 168 hours. Lipid Profile: No results for input(s): "CHOL", "HDL", "LDLCALC", "TRIG", "CHOLHDL", "LDLDIRECT" in the last 72 hours. Thyroid  Function Tests: No results for input(s): "TSH", "T4TOTAL", "FREET4", "T3FREE", "THYROIDAB" in the last 72 hours. Anemia Panel: Recent Labs    03/17/24 0544  VITAMINB12 430  FOLATE 12.6   Sepsis Labs: No results for input(s): "PROCALCITON", "LATICACIDVEN" in the last 168 hours.  No results found for this or any previous visit (from the past 240 hours).       Radiology Studies: DG Abd 2 Views Result Date: 03/16/2024 CLINICAL DATA:  Vomiting. EXAM: ABDOMEN - 2 VIEW COMPARISON:  October 22, 2020. FINDINGS: The bowel gas pattern is normal. There is  no evidence of free air. No radio-opaque calculi or other significant radiographic abnormality is seen. IMPRESSION: Negative. Electronically Signed   By: Rosalene Colon M.D.   On: 03/16/2024 16:06        Scheduled Meds:  amLODipine   10 mg Oral Daily   enoxaparin  (LOVENOX ) injection  40 mg Subcutaneous Q24H   pantoprazole  (PROTONIX ) IV  40 mg Intravenous Q24H   sodium chloride  flush  3 mL Intravenous Q12H   Continuous Infusions:  chlorproMAZINE (THORAZINE) 12.5 mg in sodium chloride  0.9 % 25 mL IVPB 12.5 mg (03/16/24 2028)   dextrose  5% lactated ringers  100 mL/hr at 03/16/24 1750     LOS: 1 day   Time spent:  Haydee Lipa, DO [facility]  If 7PM-7AM, please contact night-coverage [email or web address]  03/17/2024, 7:30 AM

## 2024-03-17 NOTE — Progress Notes (Signed)
   03/17/24 1043  TOC Brief Assessment  Insurance and Status Reviewed  Home environment has been reviewed Pt lives in a single family home  Prior level of function: Independent  Prior/Current Home Services No current home services  Social Drivers of Health Review SDOH reviewed no interventions necessary  Readmission risk has been reviewed Yes (25%)  Transition of care needs no transition of care needs at this time

## 2024-03-18 ENCOUNTER — Other Ambulatory Visit (HOSPITAL_COMMUNITY): Payer: Self-pay

## 2024-03-18 DIAGNOSIS — R112 Nausea with vomiting, unspecified: Secondary | ICD-10-CM | POA: Diagnosis not present

## 2024-03-18 DIAGNOSIS — F1290 Cannabis use, unspecified, uncomplicated: Secondary | ICD-10-CM | POA: Diagnosis not present

## 2024-03-18 LAB — COMPREHENSIVE METABOLIC PANEL WITH GFR
ALT: 36 U/L (ref 0–44)
AST: 55 U/L — ABNORMAL HIGH (ref 15–41)
Albumin: 3.5 g/dL (ref 3.5–5.0)
Alkaline Phosphatase: 60 U/L (ref 38–126)
Anion gap: 9 (ref 5–15)
BUN: 8 mg/dL (ref 6–20)
CO2: 25 mmol/L (ref 22–32)
Calcium: 8.3 mg/dL — ABNORMAL LOW (ref 8.9–10.3)
Chloride: 92 mmol/L — ABNORMAL LOW (ref 98–111)
Creatinine, Ser: 0.76 mg/dL (ref 0.61–1.24)
GFR, Estimated: >60 mL/min (ref >60)
Glucose, Bld: 135 mg/dL — ABNORMAL HIGH (ref 70–99)
Potassium: 3.2 mmol/L — ABNORMAL LOW (ref 3.5–5.1)
Sodium: 126 mmol/L — ABNORMAL LOW (ref 135–145)
Total Bilirubin: 1.5 mg/dL — ABNORMAL HIGH (ref 0.0–1.2)
Total Protein: 7.1 g/dL (ref 6.5–8.1)

## 2024-03-18 LAB — CBC
HCT: 34.6 % — ABNORMAL LOW (ref 39.0–52.0)
Hemoglobin: 11.7 g/dL — ABNORMAL LOW (ref 13.0–17.0)
MCH: 29.5 pg (ref 26.0–34.0)
MCHC: 33.8 g/dL (ref 30.0–36.0)
MCV: 87.4 fL (ref 80.0–100.0)
Platelets: 82 10*3/uL — ABNORMAL LOW (ref 150–400)
RBC: 3.96 MIL/uL — ABNORMAL LOW (ref 4.22–5.81)
RDW: 14.9 % (ref 11.5–15.5)
WBC: 4.4 10*3/uL (ref 4.0–10.5)
nRBC: 0 % (ref 0.0–0.2)

## 2024-03-18 LAB — MAGNESIUM: Magnesium: 1.6 mg/dL — ABNORMAL LOW (ref 1.7–2.4)

## 2024-03-18 MED ORDER — LACTATED RINGERS IV BOLUS
500.0000 mL | Freq: Once | INTRAVENOUS | Status: AC
  Administered 2024-03-18: 500 mL via INTRAVENOUS

## 2024-03-18 MED ORDER — ONDANSETRON HCL 4 MG PO TABS
4.0000 mg | ORAL_TABLET | Freq: Three times a day (TID) | ORAL | 0 refills | Status: DC | PRN
  Filled 2024-03-18 (×2): qty 90, 30d supply, fill #0

## 2024-03-18 MED ORDER — LOPERAMIDE HCL 2 MG PO CAPS
2.0000 mg | ORAL_CAPSULE | ORAL | Status: AC | PRN
  Administered 2024-03-18: 2 mg via ORAL
  Filled 2024-03-18: qty 1

## 2024-03-18 NOTE — Plan of Care (Signed)

## 2024-03-18 NOTE — Progress Notes (Signed)
 Pt verbalized concern regarding swollen red area on side of L leg.  To notify MD at this time

## 2024-03-18 NOTE — Plan of Care (Signed)
  Problem: Health Behavior/Discharge Planning: Goal: Ability to manage health-related needs will improve 03/18/2024 1111 by Suzi Essex, RN Outcome: Adequate for Discharge 03/18/2024 0903 by Suzi Essex, RN Outcome: Progressing   Problem: Clinical Measurements: Goal: Ability to maintain clinical measurements within normal limits will improve 03/18/2024 1111 by Suzi Essex, RN Outcome: Adequate for Discharge 03/18/2024 0903 by Suzi Essex, RN Outcome: Progressing Goal: Will remain free from infection 03/18/2024 1111 by Suzi Essex, RN Outcome: Adequate for Discharge 03/18/2024 0903 by Suzi Essex, RN Outcome: Progressing Goal: Diagnostic test results will improve 03/18/2024 1111 by Suzi Essex, RN Outcome: Adequate for Discharge 03/18/2024 0903 by Suzi Essex, RN Outcome: Progressing Goal: Respiratory complications will improve 03/18/2024 1111 by Suzi Essex, RN Outcome: Adequate for Discharge 03/18/2024 0903 by Suzi Essex, RN Outcome: Progressing Goal: Cardiovascular complication will be avoided 03/18/2024 1111 by Suzi Essex, RN Outcome: Adequate for Discharge 03/18/2024 0903 by Suzi Essex, RN Outcome: Progressing   Problem: Activity: Goal: Risk for activity intolerance will decrease 03/18/2024 1111 by Suzi Essex, RN Outcome: Adequate for Discharge 03/18/2024 0903 by Suzi Essex, RN Outcome: Progressing   Problem: Nutrition: Goal: Adequate nutrition will be maintained 03/18/2024 1111 by Suzi Essex, RN Outcome: Adequate for Discharge 03/18/2024 0903 by Suzi Essex, RN Outcome: Progressing   Problem: Coping: Goal: Level of anxiety will decrease 03/18/2024 1111 by Suzi Essex, RN Outcome: Adequate for Discharge 03/18/2024 0903 by Suzi Essex, RN Outcome: Progressing   Problem: Elimination: Goal: Will not experience complications related to bowel motility 03/18/2024 1111 by Suzi Essex, RN Outcome: Adequate for  Discharge 03/18/2024 0903 by Suzi Essex, RN Outcome: Progressing Goal: Will not experience complications related to urinary retention 03/18/2024 1111 by Suzi Essex, RN Outcome: Adequate for Discharge 03/18/2024 0903 by Suzi Essex, RN Outcome: Progressing

## 2024-03-18 NOTE — Discharge Summary (Signed)
 Physician Discharge Summary  SABASTION DEAN Lopez:[id number] DOB: Mar 03, 1976 DOA: 03/16/2024  PCP: Marius Siemens, NP  Admit date: 03/16/2024 Discharge date: 03/18/2024  Admitted From: Home Disposition: Home  Recommendations for Outpatient Follow-up:  Follow up with PCP in 1-2 weeks  Home Health: None Equipment/Devices: None  Discharge Condition: Stable CODE STATUS: Full Diet recommendation: Low-salt low-fat bland diet  Brief/Interim Summary: Richard Lopez is a 48 y.o. male with medical history including chronic pain due to prior orthopedic procedure, and ongoing chronic marijuana use/abuse and cannabinoid hyperemesis syndrome. Patient presents with intractable nausea vomiting consistent with his cannabinoid hyperemesis syndrome with ongoing use and abuse of marijuana.  Hospitalist called for admission given patient's inability to tolerate p.o. with profound electrolyte abnormalities.  Patient admitted as above with intractable nausea vomiting and inability to tolerate p.o. nutrition, fluids or medications.  Patient has well-documented history of cannabinoid hyperemesis syndrome with ongoing marijuana use, patient admits to ongoing use which subsequently resulted in recurrent admission and intractable symptoms.  Patient now tolerating p.o. quite well, lengthy discussion at bedside about need for cessation of cannabinoids as he continues to run the risk of recurrent episodes/admissions.  Patient understands the risk factors of ongoing cannabinoid use.  Patient's labs corrected with IV fluids, supportive care and increase p.o. intake.  He is now back to baseline and requesting discharge.   Discharge Diagnoses:  Principal Problem:   Cannabinoid hyperemesis syndrome Active Problems:   Thrombocytopenia (HCC)   Alcohol use   Hypokalemia   Hyponatremia   Discharge Instructions  Discharge Instructions     Call MD for:  difficulty breathing, headache or visual disturbances   Complete by:  As directed    Call MD for:  extreme fatigue   Complete by: As directed    Call MD for:  hives   Complete by: As directed    Call MD for:  persistant dizziness or light-headedness   Complete by: As directed    Call MD for:  persistant nausea and vomiting   Complete by: As directed    Call MD for:  severe uncontrolled pain   Complete by: As directed    Call MD for:  temperature >100.4   Complete by: As directed    Diet - low sodium heart healthy   Complete by: As directed    Discharge instructions   Complete by: As directed    Stop using marijuana as this is the likely cause of your ongoing symptoms   Increase activity slowly   Complete by: As directed       Allergies as of 03/18/2024       Reactions   Ativan  [lorazepam ] Anxiety, Other (See Comments)   Hallucinations   Nsaids Other (See Comments)   Hx of esophageal ulcer        Medication List     TAKE these medications    amLODipine  10 MG tablet Commonly known as: NORVASC  Take 1 tablet (10 mg total) by mouth daily.   multivitamin with minerals Tabs tablet Take 1 tablet by mouth daily.   ondansetron  4 MG tablet Commonly known as: Zofran  Take 1 tablet (4 mg total) by mouth every 8 (eight) hours as needed for nausea or vomiting.   pantoprazole  40 MG tablet Commonly known as: Protonix  Take 2 tablets (80 mg total) by mouth daily. What changed: how much to take        Allergies  Allergen Reactions   Ativan  [Lorazepam ] Anxiety and Other (See Comments)  Hallucinations   Nsaids Other (See Comments)    Hx of esophageal ulcer    Consultations: None  Procedures/Studies: DG Abd 2 Views Result Date: 03/16/2024 CLINICAL DATA:  Vomiting. EXAM: ABDOMEN - 2 VIEW COMPARISON:  October 22, 2020. FINDINGS: The bowel gas pattern is normal. There is no evidence of free air. No radio-opaque calculi or other significant radiographic abnormality is seen. IMPRESSION: Negative. Electronically Signed   By: Rosalene Colon  M.D.   On: 03/16/2024 16:06     Subjective: No acute issues or events overnight   Discharge Exam: Vitals:   03/17/24 2000 03/18/24 0412  BP: (!) 148/78 128/88  Pulse: 90 85  Resp: 18 20  Temp: 98.3 F (36.8 C) 98.7 F (37.1 C)  SpO2: 98% 97%   Vitals:   03/17/24 0352 03/17/24 1245 03/17/24 2000 03/18/24 0412  BP: (!) 173/85 (!) 150/99 (!) 148/78 128/88  Pulse: 90 93 90 85  Resp: 20 17 18 20   Temp: (!) 97.4 F (36.3 C) 97.7 F (36.5 C) 98.3 F (36.8 C) 98.7 F (37.1 C)  TempSrc: Oral Oral Oral   SpO2: 96% 98% 98% 97%  Weight:        General: Pt is alert, awake, not in acute distress Cardiovascular: RRR, S1/S2 +, no rubs, no gallops Respiratory: CTA bilaterally, no wheezing, no rhonchi Abdominal: Soft, NT, ND, bowel sounds + Extremities: no edema, no cyanosis    The results of significant diagnostics from this hospitalization (including imaging, microbiology, ancillary and laboratory) are listed below for reference.     Microbiology: No results found for this or any previous visit (from the past 240 hours).   Labs: BNP (last 3 results) No results for input(s): "BNP" in the last 8760 hours. Basic Metabolic Panel: Recent Labs  Lab 03/16/24 0600 03/16/24 1635 03/17/24 0025 03/17/24 0544 03/18/24 0514  NA 128* 127* 129* 129* 126*  K 2.7* 3.0* 3.0* 3.2* 3.2*  CL 82* 85* 90* 91* 92*  CO2 29 28 26 25 25   GLUCOSE 151* 142* 143* 149* 135*  BUN 16 14 12 9 8   CREATININE 1.09 0.81 0.74 0.59* 0.76  CALCIUM  8.4* 7.9* 7.9* 8.1* 8.3*  MG  --  1.2*  --  1.3* 1.6*   Liver Function Tests: Recent Labs  Lab 03/16/24 0600 03/18/24 0514  AST 58* 55*  ALT 39 36  ALKPHOS 67 60  BILITOT 2.1* 1.5*  PROT 8.0 7.1  ALBUMIN  4.3 3.5   Recent Labs  Lab 03/16/24 0600  LIPASE 27   No results for input(s): "AMMONIA" in the last 168 hours. CBC: Recent Labs  Lab 03/16/24 0600 03/17/24 0544 03/18/24 0514  WBC 8.4 4.5 4.4  HGB 13.5 12.0* 11.7*  HCT 38.8* 34.6*  34.6*  MCV 86.0 86.5 87.4  PLT 95* 75* 82*   Cardiac Enzymes: No results for input(s): "CKTOTAL", "CKMB", "CKMBINDEX", "TROPONINI" in the last 168 hours. BNP: Invalid input(s): "POCBNP" CBG: No results for input(s): "GLUCAP" in the last 168 hours. D-Dimer No results for input(s): "DDIMER" in the last 72 hours. Hgb A1c No results for input(s): "HGBA1C" in the last 72 hours. Lipid Profile No results for input(s): "CHOL", "HDL", "LDLCALC", "TRIG", "CHOLHDL", "LDLDIRECT" in the last 72 hours. Thyroid  function studies No results for input(s): "TSH", "T4TOTAL", "T3FREE", "THYROIDAB" in the last 72 hours.  Invalid input(s): "FREET3" Anemia work up Recent Labs    03/17/24 0544  VITAMINB12 430  FOLATE 12.6   Urinalysis    Component Value Date/Time  COLORURINE YELLOW 03/16/2024 0843   APPEARANCEUR CLEAR 03/16/2024 0843   LABSPEC 1.008 03/16/2024 0843   PHURINE 7.0 03/16/2024 0843   GLUCOSEU NEGATIVE 03/16/2024 0843   HGBUR MODERATE (A) 03/16/2024 0843   BILIRUBINUR NEGATIVE 03/16/2024 0843   KETONESUR 5 (A) 03/16/2024 0843   PROTEINUR 100 (A) 03/16/2024 0843   UROBILINOGEN 1.0 07/29/2014 1837   NITRITE NEGATIVE 03/16/2024 0843   LEUKOCYTESUR NEGATIVE 03/16/2024 0843   Sepsis Labs Recent Labs  Lab 03/16/24 0600 03/17/24 0544 03/18/24 0514  WBC 8.4 4.5 4.4   Microbiology No results found for this or any previous visit (from the past 240 hours).   Time coordinating discharge: Over 30 minutes  SIGNED:   Haydee Lipa, DO [facility] 03/18/2024, 10:42 AM Pager   If 7PM-7AM, please contact night-coverage [email or web address]

## 2024-03-18 NOTE — Progress Notes (Signed)
 AVS reviewed w/ pt who verbalized an understanding- no other questions- TOC med to be delivered in d/c lounge. PIV removed by primary RN- Pt dressed fo rd/c to home. Pt ambulated to d/c lounge

## 2024-03-21 ENCOUNTER — Telehealth: Payer: Self-pay | Admitting: *Deleted

## 2024-03-21 NOTE — Transitions of Care (Post Inpatient/ED Visit) (Signed)
 03/21/2024  Name: Richard Lopez MRN: [id number] DOB: 01-20-1976  Today's TOC FU Call Status: Today's TOC FU Call Status:: Successful TOC FU Call Completed TOC FU Call Complete Date: 03/21/24 Patient's Name and Date of Birth confirmed.  Transition Care Management Follow-up Telephone Call Date of Discharge: 03/18/24 Discharge Facility: [facility]) Type of Discharge: Inpatient Admission Primary Inpatient Discharge Diagnosis:: Cannabinoid hyperemesis syndrome How have you been since you were released from the hospital?: Better Any questions or concerns?: No  Items Reviewed: Did you receive and understand the discharge instructions provided?: Yes Medications obtained,verified, and reconciled?: Yes (Medications Reviewed) Any new allergies since your discharge?: No Dietary orders reviewed?: Yes Type of Diet Ordered:: Low-salt low-fat bland diet Do you have support at home?: No  Medications Reviewed Today: Medications Reviewed Today     Reviewed by Aura Leeds, RN (Registered Nurse) on 03/21/24 at 1628  Med List Status: <None>   Medication Order Taking? Sig Documenting Provider Last Dose Status Informant  amLODipine  (NORVASC ) 10 MG tablet [id number] Yes Take 1 tablet (10 mg total) by mouth daily. Unk Garb, DO Taking Active Self, Pharmacy Records, Multiple Informants           Med Note [name]   Wed Mar 16, 2024 10:21 AM) Recent Dispenses 01/06/2024 10 MG TABS (disp 30, 30d supply) 08/27/2023 10 MG TABS (disp 30, 30d supply) 04/23/2023 10 MG TABS (disp 30, 30d supply)    Multiple Vitamin (MULTIVITAMIN WITH MINERALS) TABS tablet [id number] Yes Take 1 tablet by mouth daily. Marius Siemens, NP Taking Active Self, Pharmacy Records, Multiple Informants  ondansetron  (ZOFRAN ) 4 MG tablet [id number] Yes Take 1 tablet (4 mg total) by mouth every 8 (eight) hours as needed for nausea or vomiting. Haydee Lipa, MD Taking Active   pantoprazole  (PROTONIX ) 40 MG  tablet [id number] Yes Take 2 tablets (80 mg total) by mouth daily.  Patient taking differently: Take 40 mg by mouth daily.   Marius Siemens, NP Taking Active Self, Pharmacy Records, Multiple Informants           Med Note [name]   Wed Mar 16, 2024 10:22 AM) Recent Dispenses 03/02/2024 40 MG TBEC (disp 90, 45d supply) 08/27/2023 40 MG TBEC (disp 60, 30d supply) 04/23/2023 40 MG TBEC (disp 28, 28d supply)     Med List Note [name], [name] 03/16/24 1231): History of intermittent compliance             Home Care and Equipment/Supplies: Were Home Health Services Ordered?: No Any new equipment or medical supplies ordered?: No  Functional Questionnaire: Do you need assistance with bathing/showering or dressing?: No Do you need assistance with meal preparation?: No Do you need assistance with eating?: No Do you have difficulty maintaining continence: No Do you need assistance with getting out of bed/getting out of a chair/moving?: No Do you have difficulty managing or taking your medications?: No  Follow up appointments reviewed: PCP Follow-up appointment confirmed?: No (Patient will call to schedule hospital follow up) MD Provider Line [phone] Given: No Specialist Hospital Follow-up appointment confirmed?: NA Do you need transportation to your follow-up appointment?: Yes Transportation Need Intervention Addressed By:: Other: (Provided patient with information transportation provided by [facility] [phone]) Do you understand care options if your condition(s) worsen?: Yes-patient verbalized understanding  SDOH Interventions Today    Flowsheet Row Most Recent Value  SDOH Interventions   Food Insecurity Interventions Intervention Not Indicated  Housing Interventions Intervention Not Indicated  Transportation Interventions Other (  Comment), Payor Benefit  [Provided with transportation provided by [employer]]  Utilities Interventions Intervention Not  Indicated       Arna Better RN, BSN [place]  [facility] RN Care Manager [phone]

## 2024-03-21 NOTE — Transitions of Care (Post Inpatient/ED Visit) (Signed)
   03/21/2024  Name: Richard Lopez MRN: [id number] DOB: 08-17-1976  Today's TOC FU Call Status: Today's TOC FU Call Status:: Unsuccessful Call (1st Attempt) Unsuccessful Call (1st Attempt) Date: 03/21/24  Attempted to reach the patient regarding the most recent Inpatient/ED visit.  Follow Up Plan: Additional outreach attempts will be made to reach the patient to complete the Transitions of Care (Post Inpatient/ED visit) call.   Arna Better RN, BSN [place]  [facility] RN Care Manager [phone]

## 2024-04-10 ENCOUNTER — Inpatient Hospital Stay (HOSPITAL_COMMUNITY)
Admission: EM | Admit: 2024-04-10 | Discharge: 2024-04-13 | DRG: 391 | Disposition: A | Discharge status: Home / Self Care | Attending: Internal Medicine | Admitting: Internal Medicine

## 2024-04-10 ENCOUNTER — Other Ambulatory Visit: Payer: Self-pay

## 2024-04-10 DIAGNOSIS — Z86711 Personal history of pulmonary embolism: Secondary | ICD-10-CM

## 2024-04-10 DIAGNOSIS — E871 Hypo-osmolality and hyponatremia: Secondary | ICD-10-CM | POA: Diagnosis present

## 2024-04-10 DIAGNOSIS — M1A9XX1 Chronic gout, unspecified, with tophus (tophi): Secondary | ICD-10-CM | POA: Diagnosis present

## 2024-04-10 DIAGNOSIS — Z833 Family history of diabetes mellitus: Secondary | ICD-10-CM

## 2024-04-10 DIAGNOSIS — Z5982 Transportation insecurity: Secondary | ICD-10-CM

## 2024-04-10 DIAGNOSIS — F1210 Cannabis abuse, uncomplicated: Secondary | ICD-10-CM | POA: Diagnosis present

## 2024-04-10 DIAGNOSIS — K7010 Alcoholic hepatitis without ascites: Secondary | ICD-10-CM | POA: Diagnosis present

## 2024-04-10 DIAGNOSIS — F1010 Alcohol abuse, uncomplicated: Secondary | ICD-10-CM | POA: Diagnosis present

## 2024-04-10 DIAGNOSIS — E876 Hypokalemia: Secondary | ICD-10-CM | POA: Diagnosis present

## 2024-04-10 DIAGNOSIS — N179 Acute kidney failure, unspecified: Principal | ICD-10-CM | POA: Diagnosis present

## 2024-04-10 DIAGNOSIS — K2211 Ulcer of esophagus with bleeding: Secondary | ICD-10-CM | POA: Diagnosis present

## 2024-04-10 DIAGNOSIS — Z79899 Other long term (current) drug therapy: Secondary | ICD-10-CM

## 2024-04-10 DIAGNOSIS — Z5986 Financial insecurity: Secondary | ICD-10-CM

## 2024-04-10 DIAGNOSIS — Z8719 Personal history of other diseases of the digestive system: Secondary | ICD-10-CM

## 2024-04-10 DIAGNOSIS — F39 Unspecified mood [affective] disorder: Secondary | ICD-10-CM | POA: Diagnosis present

## 2024-04-10 DIAGNOSIS — K3189 Other diseases of stomach and duodenum: Secondary | ICD-10-CM | POA: Diagnosis present

## 2024-04-10 DIAGNOSIS — K2100 Gastro-esophageal reflux disease with esophagitis, without bleeding: Secondary | ICD-10-CM | POA: Diagnosis present

## 2024-04-10 DIAGNOSIS — K219 Gastro-esophageal reflux disease without esophagitis: Secondary | ICD-10-CM

## 2024-04-10 DIAGNOSIS — Z8249 Family history of ischemic heart disease and other diseases of the circulatory system: Secondary | ICD-10-CM

## 2024-04-10 DIAGNOSIS — K449 Diaphragmatic hernia without obstruction or gangrene: Secondary | ICD-10-CM | POA: Diagnosis present

## 2024-04-10 DIAGNOSIS — F12188 Cannabis abuse with other cannabis-induced disorder: Secondary | ICD-10-CM | POA: Insufficient documentation

## 2024-04-10 DIAGNOSIS — F1290 Cannabis use, unspecified, uncomplicated: Secondary | ICD-10-CM

## 2024-04-10 DIAGNOSIS — Z87891 Personal history of nicotine dependence: Secondary | ICD-10-CM

## 2024-04-10 DIAGNOSIS — N1831 Chronic kidney disease, stage 3a: Secondary | ICD-10-CM | POA: Diagnosis present

## 2024-04-10 DIAGNOSIS — E873 Alkalosis: Secondary | ICD-10-CM | POA: Diagnosis present

## 2024-04-10 DIAGNOSIS — K5900 Constipation, unspecified: Secondary | ICD-10-CM | POA: Diagnosis present

## 2024-04-10 DIAGNOSIS — E119 Type 2 diabetes mellitus without complications: Secondary | ICD-10-CM | POA: Diagnosis present

## 2024-04-10 DIAGNOSIS — E869 Volume depletion, unspecified: Secondary | ICD-10-CM | POA: Diagnosis present

## 2024-04-10 DIAGNOSIS — K209 Esophagitis, unspecified without bleeding: Secondary | ICD-10-CM | POA: Insufficient documentation

## 2024-04-10 DIAGNOSIS — Z86718 Personal history of other venous thrombosis and embolism: Secondary | ICD-10-CM

## 2024-04-10 DIAGNOSIS — K8020 Calculus of gallbladder without cholecystitis without obstruction: Secondary | ICD-10-CM | POA: Diagnosis present

## 2024-04-10 DIAGNOSIS — D696 Thrombocytopenia, unspecified: Secondary | ICD-10-CM | POA: Diagnosis present

## 2024-04-10 DIAGNOSIS — I480 Paroxysmal atrial fibrillation: Secondary | ICD-10-CM | POA: Diagnosis present

## 2024-04-10 DIAGNOSIS — I10 Essential (primary) hypertension: Secondary | ICD-10-CM | POA: Diagnosis present

## 2024-04-10 DIAGNOSIS — Z888 Allergy status to other drugs, medicaments and biological substances status: Secondary | ICD-10-CM

## 2024-04-10 DIAGNOSIS — K760 Fatty (change of) liver, not elsewhere classified: Secondary | ICD-10-CM | POA: Diagnosis present

## 2024-04-10 DIAGNOSIS — R112 Nausea with vomiting, unspecified: Principal | ICD-10-CM | POA: Diagnosis present

## 2024-04-10 DIAGNOSIS — K922 Gastrointestinal hemorrhage, unspecified: Secondary | ICD-10-CM | POA: Diagnosis present

## 2024-04-10 DIAGNOSIS — R000 Tachycardia, unspecified: Secondary | ICD-10-CM | POA: Diagnosis present

## 2024-04-10 LAB — COMPREHENSIVE METABOLIC PANEL WITH GFR
ALT: 28 U/L (ref 0–44)
AST: 55 U/L — ABNORMAL HIGH (ref 15–41)
Albumin: 5 g/dL (ref 3.5–5.0)
Alkaline Phosphatase: 78 U/L (ref 38–126)
Anion gap: 20 — ABNORMAL HIGH (ref 5–15)
BUN: 22 mg/dL — ABNORMAL HIGH (ref 6–20)
CO2: 31 mmol/L (ref 22–32)
Calcium: 9.6 mg/dL (ref 8.9–10.3)
Chloride: 83 mmol/L — ABNORMAL LOW (ref 98–111)
Creatinine, Ser: 2.23 mg/dL — ABNORMAL HIGH (ref 0.61–1.24)
GFR, Estimated: 36 mL/min — ABNORMAL LOW (ref >60)
Glucose, Bld: 194 mg/dL — ABNORMAL HIGH (ref 70–99)
Potassium: 3.5 mmol/L (ref 3.5–5.1)
Sodium: 134 mmol/L — ABNORMAL LOW (ref 135–145)
Total Bilirubin: 1.5 mg/dL — ABNORMAL HIGH (ref 0.0–1.2)
Total Protein: 9.6 g/dL — ABNORMAL HIGH (ref 6.5–8.1)

## 2024-04-10 LAB — CBC
HCT: 36.9 % — ABNORMAL LOW (ref 39.0–52.0)
Hemoglobin: 12.8 g/dL — ABNORMAL LOW (ref 13.0–17.0)
MCH: 29.7 pg (ref 26.0–34.0)
MCHC: 34.7 g/dL (ref 30.0–36.0)
MCV: 85.6 fL (ref 80.0–100.0)
Platelets: 182 10*3/uL (ref 150–400)
RBC: 4.31 MIL/uL (ref 4.22–5.81)
RDW: 15.8 % — ABNORMAL HIGH (ref 11.5–15.5)
WBC: 7.3 10*3/uL (ref 4.0–10.5)
nRBC: 0 % (ref 0.0–0.2)

## 2024-04-10 LAB — BLOOD GAS, VENOUS
Acid-Base Excess: 15 mmol/L — ABNORMAL HIGH (ref 0.0–2.0)
Bicarbonate: 38.4 mmol/L — ABNORMAL HIGH (ref 20.0–28.0)
O2 Saturation: 90.7 %
Patient temperature: 37
pCO2, Ven: 41 mmHg — ABNORMAL LOW (ref 44–60)
pH, Ven: 7.58 — ABNORMAL HIGH (ref 7.25–7.43)
pO2, Ven: 57 mmHg — ABNORMAL HIGH (ref 32–45)

## 2024-04-10 LAB — I-STAT CG4 LACTIC ACID, ED: Lactic Acid, Venous: 2.8 mmol/L (ref 0.5–1.9)

## 2024-04-10 LAB — LIPASE, BLOOD: Lipase: 37 U/L (ref 11–51)

## 2024-04-10 MED ORDER — SODIUM CHLORIDE 0.9 % IV BOLUS
1000.0000 mL | Freq: Once | INTRAVENOUS | Status: AC
  Administered 2024-04-11: 1000 mL via INTRAVENOUS

## 2024-04-10 MED ORDER — ONDANSETRON HCL 4 MG/2ML IJ SOLN
4.0000 mg | Freq: Once | INTRAMUSCULAR | Status: AC
  Administered 2024-04-10: 4 mg via INTRAVENOUS
  Filled 2024-04-10: qty 2

## 2024-04-10 MED ORDER — SODIUM CHLORIDE 0.9 % IV BOLUS
1000.0000 mL | Freq: Once | INTRAVENOUS | Status: AC
  Administered 2024-04-10: 1000 mL via INTRAVENOUS

## 2024-04-10 NOTE — ED Provider Notes (Signed)
 [facility] Provider Note   CSN: [id number] Arrival date & time: 04/10/24  2103     History  No chief complaint on file.   Richard Lopez is a 48 y.o. male with past medical history significant for diabetes, cannabis hyperemesis syndrome, hypertension, alcohol abuse who presents with concern for nausea, vomiting since around 3 AM this morning.  He does report smoking some marijuana with his family yesterday as well as drinking some.  He reports that his given in some irritation in the past.  He reports he has not had any sleep secondary to the vomiting and he is falling asleep during our exam.  Reports some mild blood-streaked emesis but no large-volume hematemesis.  HPI     Home Medications Prior to Admission medications   Medication Sig Start Date End Date Taking? Authorizing Provider  amLODipine  (NORVASC ) 10 MG tablet Take 1 tablet (10 mg total) by mouth daily. 12/16/23 03/21/24  Unk Garb, DO  Multiple Vitamin (MULTIVITAMIN WITH MINERALS) TABS tablet Take 1 tablet by mouth daily. 03/02/24   Marius Siemens, NP  ondansetron  (ZOFRAN ) 4 MG tablet Take 1 tablet (4 mg total) by mouth every 8 (eight) hours as needed for nausea or vomiting. 03/18/24   Haydee Lipa, MD  pantoprazole  (PROTONIX ) 40 MG tablet Take 2 tablets (80 mg total) by mouth daily. Patient taking differently: Take 40 mg by mouth daily. 03/02/24   Marius Siemens, NP      Allergies    Ativan  [lorazepam ] and Nsaids    Review of Systems   Review of Systems  All other systems reviewed and are negative.   Physical Exam Updated Vital Signs BP (!) 145/95   Pulse (!) 101   Temp 99.4 F (37.4 C) (Oral)   Resp (!) 21   SpO2 93%  Physical Exam Vitals and nursing note reviewed.  Constitutional:      General: He is not in acute distress.    Appearance: Normal appearance.  HENT:     Head: Normocephalic and atraumatic.  Eyes:     General:        Right eye:  No discharge.        Left eye: No discharge.  Cardiovascular:     Rate and Rhythm: Normal rate and regular rhythm.     Heart sounds: No murmur heard.    No friction rub. No gallop.  Pulmonary:     Effort: Pulmonary effort is normal.     Breath sounds: Normal breath sounds.  Abdominal:     General: Bowel sounds are normal.     Palpations: Abdomen is soft.     Comments: No significant tenderness to palpation of the epigastric region  Skin:    General: Skin is warm and dry.     Capillary Refill: Capillary refill takes less than 2 seconds.  Neurological:     Mental Status: He is alert and oriented to person, place, and time.  Psychiatric:        Mood and Affect: Mood normal.        Behavior: Behavior normal.     ED Results / Procedures / Treatments   Labs (all labs ordered are listed, but only abnormal results are displayed) Labs Reviewed  CBC - Abnormal; Notable for the following components:      Result Value   Hemoglobin 12.8 (*)    HCT 36.9 (*)    RDW 15.8 (*)    All other  components within normal limits  COMPREHENSIVE METABOLIC PANEL WITH GFR - Abnormal; Notable for the following components:   Sodium 134 (*)    Chloride 83 (*)    Glucose, Bld 194 (*)    BUN 22 (*)    Creatinine, Ser 2.23 (*)    Total Protein 9.6 (*)    AST 55 (*)    Total Bilirubin 1.5 (*)    GFR, Estimated 36 (*)    Anion gap 20 (*)    All other components within normal limits  BLOOD GAS, VENOUS - Abnormal; Notable for the following components:   pH, Ven 7.58 (*)    pCO2, Ven 41 (*)    pO2, Ven 57 (*)    Bicarbonate 38.4 (*)    Acid-Base Excess 15.0 (*)    All other components within normal limits  I-STAT CG4 LACTIC ACID, ED - Abnormal; Notable for the following components:   Lactic Acid, Venous 2.8 (*)    All other components within normal limits  LIPASE, BLOOD  URINALYSIS, ROUTINE W REFLEX MICROSCOPIC  RAPID URINE DRUG SCREEN, HOSP PERFORMED  ACETAMINOPHEN  LEVEL  ETHANOL  SALICYLATE  LEVEL    EKG EKG Interpretation Date/Time:  Sunday April 10 2024 21:41:04 EDT Ventricular Rate:  89 PR Interval:  112 QRS Duration:  92 QT Interval:  397 QTC Calculation: 484 R Axis:   36  Text Interpretation: Sinus rhythm Borderline short PR interval Borderline prolonged QT interval PVCs resolved, otherwise no significant change from last EKG Confirmed by Celesta Coke ([id number]) on 04/10/2024 10:45:05 PM  Radiology No results found.  Procedures Procedures    Medications Ordered in ED Medications  sodium chloride  0.9 % bolus 1,000 mL (has no administration in time range)  sodium chloride  0.9 % bolus 1,000 mL (1,000 mLs Intravenous New Bag/Given 04/10/24 2206)  ondansetron  (ZOFRAN ) injection 4 mg (4 mg Intravenous Given 04/10/24 2221)    ED Course/ Medical Decision Making/ A&P                                 Medical Decision Making  This patient is a 48 y.o. male  who presents to the ED for concern of nausea, vomiting, dehydration.   Differential diagnoses prior to evaluation: The emergent differential diagnosis includes, but is not limited to, cyclic vomiting syndrome, cannabinoid hyperemesis, versus acute abdominal pathology including The causes of generalized abdominal pain include but are not limited to AAA, mesenteric ischemia, appendicitis, diverticulitis, DKA, gastritis, gastroenteritis, AMI, nephrolithiasis, pancreatitis, peritonitis, adrenal insufficiency,lead poisoning, iron toxicity, intestinal ischemia, constipation, UTI,SBO/LBO, splenic rupture, biliary disease, IBD, IBS, PUD, or hepatitis. This is not an exhaustive differential.   Past Medical History / Co-morbidities / Social History: diabetes, cannabis hyperemesis syndrome, hypertension, alcohol abuse  Additional history: Chart reviewed. Pertinent results include: Reviewed lab work, imaging from patient's previous emergency department visits for similar  Physical Exam: Physical exam performed. The pertinent  findings include: Somewhat lethargic, somewhat hypertensive, blood pressure 145/95, tachycardia pulse 101, temp 99.4.  Vital signs otherwise stable.    Lab Tests/Imaging studies: I personally interpreted labs/imaging and the pertinent results include: CBC overall unremarkable other than mild anemia, hemoglobin 12.8.  CMP notable for mild hyponatremia, sodium 134, profound hypochloremia, chloride 83, elevated glucose at 194, elevated BUN 22, creatinine 2.23, bilirubin 1.5, anion gap 20 .   Lactic acid 2.8, VBG alkalotic  Cardiac monitoring: EKG obtained and interpreted by myself and attending physician which shows:  NSR, borderline prolonged QT   Medications: I ordered medication including zofran , fluids for nausea, dehydration.  I have reviewed the patients home medicines and have made adjustments as needed.  11:46 PM Care of Raequan Vanschaick Bartl transferred to PA Atlee Leach and Dr. Monique Ano at the end of my shift as the patient will require reassessment once labs/imaging have resulted. Patient presentation, ED course, and plan of care discussed with review of all pertinent labs and imaging. Please see his/her note for further details regarding further ED course and disposition. Plan at time of handoff is admission for AKI in context of cannabinoid hyperemesis, severe vomiting, dehydration. This may be altered or completely changed at the discretion of the oncoming team pending results of further workup.  Final Clinical Impression(s) / ED Diagnoses Final diagnoses:  None    Rx / DC Orders ED Discharge Orders     None         Nelly Banco, PA-C 04/10/24 2346    Kingsley, Victoria K, DO 04/13/24 [phone]

## 2024-04-10 NOTE — ED Triage Notes (Addendum)
 Pt came in via EMS from home w/ c/o of N/V since about 0300 this morning. Pt reports smoking cannabis and drinking beer with family the day before. States smoking often causes him some irritation in the past. Pt reports chest pain when he sneezes and/or vomits.

## 2024-04-11 ENCOUNTER — Inpatient Hospital Stay (HOSPITAL_COMMUNITY)

## 2024-04-11 DIAGNOSIS — K2100 Gastro-esophageal reflux disease with esophagitis, without bleeding: Secondary | ICD-10-CM | POA: Diagnosis present

## 2024-04-11 DIAGNOSIS — E873 Alkalosis: Secondary | ICD-10-CM | POA: Insufficient documentation

## 2024-04-11 DIAGNOSIS — Z86711 Personal history of pulmonary embolism: Secondary | ICD-10-CM | POA: Diagnosis not present

## 2024-04-11 DIAGNOSIS — E869 Volume depletion, unspecified: Secondary | ICD-10-CM | POA: Diagnosis present

## 2024-04-11 DIAGNOSIS — F1210 Cannabis abuse, uncomplicated: Secondary | ICD-10-CM | POA: Diagnosis present

## 2024-04-11 DIAGNOSIS — Z888 Allergy status to other drugs, medicaments and biological substances status: Secondary | ICD-10-CM | POA: Diagnosis not present

## 2024-04-11 DIAGNOSIS — R112 Nausea with vomiting, unspecified: Secondary | ICD-10-CM | POA: Diagnosis present

## 2024-04-11 DIAGNOSIS — F12188 Cannabis abuse with other cannabis-induced disorder: Secondary | ICD-10-CM | POA: Insufficient documentation

## 2024-04-11 DIAGNOSIS — K2210 Ulcer of esophagus without bleeding: Secondary | ICD-10-CM | POA: Diagnosis not present

## 2024-04-11 DIAGNOSIS — N1831 Chronic kidney disease, stage 3a: Secondary | ICD-10-CM | POA: Diagnosis present

## 2024-04-11 DIAGNOSIS — K2211 Ulcer of esophagus with bleeding: Secondary | ICD-10-CM | POA: Diagnosis present

## 2024-04-11 DIAGNOSIS — Z8249 Family history of ischemic heart disease and other diseases of the circulatory system: Secondary | ICD-10-CM | POA: Diagnosis not present

## 2024-04-11 DIAGNOSIS — F39 Unspecified mood [affective] disorder: Secondary | ICD-10-CM | POA: Diagnosis present

## 2024-04-11 DIAGNOSIS — Z79899 Other long term (current) drug therapy: Secondary | ICD-10-CM | POA: Diagnosis not present

## 2024-04-11 DIAGNOSIS — K3189 Other diseases of stomach and duodenum: Secondary | ICD-10-CM | POA: Diagnosis present

## 2024-04-11 DIAGNOSIS — N179 Acute kidney failure, unspecified: Secondary | ICD-10-CM

## 2024-04-11 DIAGNOSIS — K7010 Alcoholic hepatitis without ascites: Secondary | ICD-10-CM | POA: Diagnosis present

## 2024-04-11 DIAGNOSIS — E119 Type 2 diabetes mellitus without complications: Secondary | ICD-10-CM | POA: Diagnosis present

## 2024-04-11 DIAGNOSIS — E871 Hypo-osmolality and hyponatremia: Secondary | ICD-10-CM | POA: Diagnosis present

## 2024-04-11 DIAGNOSIS — Z86718 Personal history of other venous thrombosis and embolism: Secondary | ICD-10-CM | POA: Diagnosis not present

## 2024-04-11 DIAGNOSIS — I10 Essential (primary) hypertension: Secondary | ICD-10-CM | POA: Diagnosis present

## 2024-04-11 DIAGNOSIS — E876 Hypokalemia: Secondary | ICD-10-CM | POA: Diagnosis present

## 2024-04-11 DIAGNOSIS — F1010 Alcohol abuse, uncomplicated: Secondary | ICD-10-CM | POA: Diagnosis present

## 2024-04-11 DIAGNOSIS — K922 Gastrointestinal hemorrhage, unspecified: Secondary | ICD-10-CM

## 2024-04-11 DIAGNOSIS — D696 Thrombocytopenia, unspecified: Secondary | ICD-10-CM | POA: Diagnosis present

## 2024-04-11 DIAGNOSIS — K449 Diaphragmatic hernia without obstruction or gangrene: Secondary | ICD-10-CM | POA: Diagnosis present

## 2024-04-11 DIAGNOSIS — I480 Paroxysmal atrial fibrillation: Secondary | ICD-10-CM | POA: Diagnosis present

## 2024-04-11 LAB — BLOOD GAS, VENOUS
Acid-Base Excess: 11.3 mmol/L — ABNORMAL HIGH (ref 0.0–2.0)
Bicarbonate: 35.1 mmol/L — ABNORMAL HIGH (ref 20.0–28.0)
O2 Saturation: 93.2 %
Patient temperature: 37
pCO2, Ven: 42 mmHg — ABNORMAL LOW (ref 44–60)
pH, Ven: 7.53 — ABNORMAL HIGH (ref 7.25–7.43)
pO2, Ven: 62 mmHg — ABNORMAL HIGH (ref 32–45)

## 2024-04-11 LAB — HEPATIC FUNCTION PANEL
ALT: 21 U/L (ref 0–44)
AST: 45 U/L — ABNORMAL HIGH (ref 15–41)
Albumin: 3.9 g/dL (ref 3.5–5.0)
Alkaline Phosphatase: 62 U/L (ref 38–126)
Bilirubin, Direct: 0.3 mg/dL — ABNORMAL HIGH (ref 0.0–0.2)
Indirect Bilirubin: 0.9 mg/dL (ref 0.3–0.9)
Total Bilirubin: 1.2 mg/dL (ref 0.0–1.2)
Total Protein: 7.8 g/dL (ref 6.5–8.1)

## 2024-04-11 LAB — CBC
HCT: 34.2 % — ABNORMAL LOW (ref 39.0–52.0)
Hemoglobin: 11.6 g/dL — ABNORMAL LOW (ref 13.0–17.0)
MCH: 30 pg (ref 26.0–34.0)
MCHC: 33.9 g/dL (ref 30.0–36.0)
MCV: 88.4 fL (ref 80.0–100.0)
Platelets: 128 10*3/uL — ABNORMAL LOW (ref 150–400)
RBC: 3.87 MIL/uL — ABNORMAL LOW (ref 4.22–5.81)
RDW: 16 % — ABNORMAL HIGH (ref 11.5–15.5)
WBC: 7.2 10*3/uL (ref 4.0–10.5)
nRBC: 0 % (ref 0.0–0.2)

## 2024-04-11 LAB — URINALYSIS, ROUTINE W REFLEX MICROSCOPIC
Bacteria, UA: NONE SEEN
Bilirubin Urine: NEGATIVE
Glucose, UA: 50 mg/dL — AB
Ketones, ur: NEGATIVE mg/dL
Nitrite: NEGATIVE
Protein, ur: 100 mg/dL — AB
Specific Gravity, Urine: 1.021 (ref 1.005–1.030)
pH: 5 (ref 5.0–8.0)

## 2024-04-11 LAB — BASIC METABOLIC PANEL WITH GFR
Anion gap: 15 (ref 5–15)
BUN: 19 mg/dL (ref 6–20)
CO2: 26 mmol/L (ref 22–32)
Calcium: 8.2 mg/dL — ABNORMAL LOW (ref 8.9–10.3)
Chloride: 91 mmol/L — ABNORMAL LOW (ref 98–111)
Creatinine, Ser: 1.57 mg/dL — ABNORMAL HIGH (ref 0.61–1.24)
GFR, Estimated: 55 mL/min — ABNORMAL LOW (ref >60)
Glucose, Bld: 132 mg/dL — ABNORMAL HIGH (ref 70–99)
Potassium: 3.8 mmol/L (ref 3.5–5.1)
Sodium: 132 mmol/L — ABNORMAL LOW (ref 135–145)

## 2024-04-11 LAB — I-STAT CG4 LACTIC ACID, ED: Lactic Acid, Venous: 1.8 mmol/L (ref 0.5–1.9)

## 2024-04-11 LAB — RAPID URINE DRUG SCREEN, HOSP PERFORMED
Amphetamines: NOT DETECTED
Barbiturates: NOT DETECTED
Benzodiazepines: NOT DETECTED
Cocaine: NOT DETECTED
Opiates: NOT DETECTED
Tetrahydrocannabinol: POSITIVE — AB

## 2024-04-11 LAB — RESP PANEL BY RT-PCR (RSV, FLU A&B, COVID)  RVPGX2
Influenza A by PCR: NEGATIVE
Influenza B by PCR: NEGATIVE
Resp Syncytial Virus by PCR: NEGATIVE
SARS Coronavirus 2 by RT PCR: NEGATIVE

## 2024-04-11 LAB — MAGNESIUM: Magnesium: 1.8 mg/dL (ref 1.7–2.4)

## 2024-04-11 LAB — PHOSPHORUS: Phosphorus: 2.8 mg/dL (ref 2.5–4.6)

## 2024-04-11 LAB — ETHANOL: Alcohol, Ethyl (B): <15 mg/dL (ref <15)

## 2024-04-11 LAB — PROTIME-INR
INR: 1.1 (ref 0.8–1.2)
Prothrombin Time: 14.6 s (ref 11.4–15.2)

## 2024-04-11 LAB — ACETAMINOPHEN LEVEL: Acetaminophen (Tylenol), Serum: <10 ug/mL — ABNORMAL LOW (ref 10–30)

## 2024-04-11 LAB — SALICYLATE LEVEL: Salicylate Lvl: <7 mg/dL — ABNORMAL LOW (ref 7.0–30.0)

## 2024-04-11 MED ORDER — SUCRALFATE 1 GM/10ML PO SUSP
1.0000 g | Freq: Three times a day (TID) | ORAL | Status: DC
  Administered 2024-04-11 – 2024-04-13 (×8): 1 g via ORAL
  Filled 2024-04-11 (×8): qty 10

## 2024-04-11 MED ORDER — ACETAMINOPHEN 500 MG PO TABS: 1000.0000 mg | ORAL_TABLET | Freq: Four times a day (QID) | ORAL | Status: DC | PRN

## 2024-04-11 MED ORDER — SODIUM CHLORIDE 0.9% FLUSH
3.0000 mL | Freq: Two times a day (BID) | INTRAVENOUS | Status: DC
  Administered 2024-04-11 – 2024-04-13 (×5): 3 mL via INTRAVENOUS

## 2024-04-11 MED ORDER — PANTOPRAZOLE SODIUM 40 MG IV SOLR
40.0000 mg | Freq: Two times a day (BID) | INTRAVENOUS | Status: DC
  Administered 2024-04-11 – 2024-04-13 (×6): 40 mg via INTRAVENOUS
  Filled 2024-04-11 (×6): qty 10

## 2024-04-11 MED ORDER — ONDANSETRON HCL 4 MG/2ML IJ SOLN
4.0000 mg | Freq: Four times a day (QID) | INTRAMUSCULAR | Status: DC | PRN
  Administered 2024-04-11 – 2024-04-12 (×2): 4 mg via INTRAVENOUS
  Filled 2024-04-11 (×2): qty 2

## 2024-04-11 MED ORDER — SODIUM CHLORIDE 0.9 % IV SOLN: INTRAVENOUS | Status: DC

## 2024-04-11 MED ORDER — SODIUM CHLORIDE 0.9 % IV SOLN
12.5000 mg | Freq: Four times a day (QID) | INTRAVENOUS | Status: DC | PRN
  Administered 2024-04-11 – 2024-04-12 (×3): 12.5 mg via INTRAVENOUS
  Filled 2024-04-11 (×3): qty 12.5

## 2024-04-11 MED ORDER — MELATONIN 3 MG PO TABS
6.0000 mg | ORAL_TABLET | Freq: Every evening | ORAL | Status: DC | PRN
  Administered 2024-04-12 (×2): 6 mg via ORAL
  Filled 2024-04-11 (×2): qty 2

## 2024-04-11 MED ORDER — BENZONATATE 100 MG PO CAPS
200.0000 mg | ORAL_CAPSULE | Freq: Three times a day (TID) | ORAL | Status: DC | PRN
  Administered 2024-04-11: 200 mg via ORAL
  Filled 2024-04-11: qty 2

## 2024-04-11 MED ORDER — ALUM & MAG HYDROXIDE-SIMETH 200-200-20 MG/5ML PO SUSP
30.0000 mL | Freq: Once | ORAL | Status: AC
  Administered 2024-04-11: 30 mL via ORAL
  Filled 2024-04-11: qty 30

## 2024-04-11 MED ORDER — SODIUM CHLORIDE 0.9 % IV SOLN: INTRAVENOUS | Status: AC

## 2024-04-11 MED ORDER — LORAZEPAM 2 MG/ML IJ SOLN: 1.0000 mg | INTRAMUSCULAR | Status: DC | PRN

## 2024-04-11 MED ORDER — HYDROXYZINE HCL 10 MG PO TABS
10.0000 mg | ORAL_TABLET | Freq: Four times a day (QID) | ORAL | Status: AC | PRN
  Administered 2024-04-12 (×2): 10 mg via ORAL
  Filled 2024-04-11 (×3): qty 1

## 2024-04-11 MED ORDER — LORAZEPAM 1 MG PO TABS: 1.0000 mg | ORAL_TABLET | ORAL | Status: DC | PRN

## 2024-04-11 MED ORDER — THIAMINE MONONITRATE 100 MG PO TABS
100.0000 mg | ORAL_TABLET | Freq: Every day | ORAL | Status: DC
  Administered 2024-04-11 – 2024-04-13 (×2): 100 mg via ORAL
  Filled 2024-04-11 (×2): qty 1

## 2024-04-11 MED ORDER — ADULT MULTIVITAMIN W/MINERALS CH
1.0000 | ORAL_TABLET | Freq: Every day | ORAL | Status: DC
  Administered 2024-04-11 – 2024-04-13 (×3): 1 via ORAL
  Filled 2024-04-11 (×3): qty 1

## 2024-04-11 MED ORDER — MAGNESIUM SULFATE 2 GM/50ML IV SOLN
2.0000 g | Freq: Once | INTRAVENOUS | Status: AC
  Administered 2024-04-11: 2 g via INTRAVENOUS
  Filled 2024-04-11: qty 50

## 2024-04-11 MED ORDER — FLUTICASONE PROPIONATE 50 MCG/ACT NA SUSP
2.0000 | Freq: Every day | NASAL | Status: DC
  Administered 2024-04-11 – 2024-04-13 (×3): 2 via NASAL
  Filled 2024-04-11: qty 16

## 2024-04-11 MED ORDER — POTASSIUM CHLORIDE 10 MEQ/100ML IV SOLN
10.0000 meq | INTRAVENOUS | Status: AC
  Administered 2024-04-11 (×3): 10 meq via INTRAVENOUS
  Filled 2024-04-11 (×3): qty 100

## 2024-04-11 MED ORDER — THIAMINE HCL 100 MG/ML IJ SOLN
100.0000 mg | Freq: Every day | INTRAMUSCULAR | Status: DC
  Administered 2024-04-12: 100 mg via INTRAVENOUS
  Filled 2024-04-11 (×2): qty 2

## 2024-04-11 MED ORDER — FOLIC ACID 1 MG PO TABS
1.0000 mg | ORAL_TABLET | Freq: Every day | ORAL | Status: DC
  Administered 2024-04-11 – 2024-04-13 (×3): 1 mg via ORAL
  Filled 2024-04-11 (×3): qty 1

## 2024-04-11 NOTE — H&P (View-Only) (Signed)
 [facility] Consult  Referring Provider: Hospitalist/Dr. Arnulfo Larch Primary Care Physician:  Marius Siemens, NP Primary Gastroenterologist: Para Bold  Reason for Consultation: Hematemesis, coffee-ground emesis  HPI: Richard Lopez is a 48 y.o. male was in his usual state of health until Friday evening, when he woke up from sleep with nausea and vomiting and noticed vomiting bright red blood as well as coffee-ground material.  This was associated with epigastric abdominal pain and cramping.  Discontinued on Saturday and also on Sunday which prompted him to come to the ER yesterday evening. Patient states he drinks alcohol, 1-2 beers on a regular basis and also smokes marijuana, last use of both were on Friday evening when his symptoms started. Patient had an EGD in 1/24 with me for odynophagia, dysphagia and hematemesis which showed severe esophagitis from 35 to 42 cm, multiple superficial ulcers biopsy showed acute esophagitis with ulcerations, no evidence of H. pylori or intestinal metaplasia EGD 08/2019, hematemesis, Dr. Honey Lusty: Grade C esophagitis, grade B esophagitis, small hiatal hernia and acute gastritis.   Past Medical History:  Diagnosis Date   Alcohol dependence with intoxication (HCC) 03/25/2019   Anxiety    Arthritis    hands and possibly knee   Atrial fibrillation with RVR (HCC) 04/22/2020   Closed right hip fracture, initial encounter (HCC) 03/25/2019   Depression    Diabetes mellitus    diet controlled   Esophagitis determined by endoscopy 08/21/2019   Essential hypertension    Gastritis    Gout    Intertrochanteric fracture of right hip (HCC) 04/14/2019   Normocytic anemia due to blood loss 08/21/2019   Pulmonary embolism [facility])     Past Surgical History:  Procedure Laterality Date   BIOPSY  11/12/2022   Procedure: BIOPSY;  Surgeon: Genell Ken, MD;  Location: [facility];  Service: Gastroenterology;;   ESOPHAGOGASTRODUODENOSCOPY (EGD) WITH  PROPOFOL  N/A 08/14/2019   Procedure: ESOPHAGOGASTRODUODENOSCOPY (EGD) WITH PROPOFOL ;  Surgeon: Baldo Bonds, MD;  Location: [facility];  Service: Endoscopy;  Laterality: N/A;   ESOPHAGOGASTRODUODENOSCOPY (EGD) WITH PROPOFOL  N/A 11/12/2022   Procedure: ESOPHAGOGASTRODUODENOSCOPY (EGD) WITH PROPOFOL ;  Surgeon: Genell Ken, MD;  Location: [facility];  Service: Gastroenterology;  Laterality: N/A;   EXTERNAL FIXATION LEG Left 11/07/2018   Procedure: EXTERNAL FIXATION LEFT LOWER LEG;  Surgeon: Adonica Hoose, MD;  Location: [facility] OR[facility];  Service: Orthopedics;  Laterality: Left;   EXTERNAL FIXATION REMOVAL Left 11/08/2018   Procedure: REMOVAL EXTERNAL FIXATION LEG;  Surgeon: Laneta Pintos, MD;  Location: [facility] OR;  Service: Orthopedics;  Laterality: Left;   INTRAMEDULLARY (IM) NAIL INTERTROCHANTERIC Right 03/26/2019   Procedure: INTRAMEDULLARY (IM) NAIL INTERTROCHANTRIC;  Surgeon: Laneta Pintos, MD;  Location: [facility] OR;  Service: Orthopedics;  Laterality: Right;   INTRAMEDULLARY (IM) NAIL INTERTROCHANTERIC Right 05/17/2019   Procedure: Intramedullary (Im) Nail Intertroch with circlage wiring;  Surgeon: Hardy Lia, MD;  Location: [facility] OR;  Service: Orthopedics;  Laterality: Right;   NO PAST SURGERIES     OPEN REDUCTION INTERNAL FIXATION (ORIF) TIBIA/FIBULA FRACTURE Left 11/08/2018   Procedure: OPEN REDUCTION INTERNAL FIXATION (ORIF) TIBIA/FIBULA FRACTURE;  Surgeon: Laneta Pintos, MD;  Location: [facility] OR;  Service: Orthopedics;  Laterality: Left;   ORIF FEMUR FRACTURE Right 05/17/2019   Procedure: REMOVAL  OF HARDWARE;  Surgeon: Hardy Lia, MD;  Location: [facility] OR;  Service: Orthopedics;  Laterality: Right;    Prior to Admission medications   Medication Sig Start Date End Date Taking? Authorizing Provider  amLODipine  (NORVASC ) 10 MG tablet Take 10 mg  by mouth daily.   Yes [provider]  Multiple Vitamin (MULTIVITAMIN WITH MINERALS) TABS tablet Take 1 tablet by mouth daily. 03/02/24  Yes Marius Siemens, NP  ondansetron  (ZOFRAN ) 4 MG tablet Take 1 tablet (4 mg total) by mouth every 8 (eight) hours as needed for nausea or vomiting. 03/18/24  Yes Haydee Lipa, MD  pantoprazole  (PROTONIX ) 40 MG tablet Take 2 tablets (80 mg total) by mouth daily. 03/02/24  Yes Marius Siemens, NP  Potassium 99 MG TABS Take 1 tablet by mouth daily.   Yes [provider]  amLODipine  (NORVASC ) 10 MG tablet Take 1 tablet (10 mg total) by mouth daily. 12/16/23 03/21/24  Unk Garb, DO    Current Facility-Administered Medications  Medication Dose Route Frequency Provider Last Rate Last Admin   0.9 %  sodium chloride  infusion   Intravenous Continuous Segars, Jonathan, MD 100 mL/hr at 04/11/24 0151 New Bag at 04/11/24 0151   acetaminophen  (TYLENOL ) tablet 1,000 mg  1,000 mg Oral Q6H PRN Segars, Jonathan, MD       benzonatate (TESSALON) capsule 200 mg  200 mg Oral TID PRN Segars, Jonathan, MD   200 mg at 04/11/24 0505   fluticasone  (FLONASE ) 50 MCG/ACT nasal spray 2 spray  2 spray Each Nare Daily Segars, Arlyce Lambert, MD       folic acid  (FOLVITE ) tablet 1 mg  1 mg Oral Daily Segars, Jonathan, MD   1 mg at 04/11/24 1000   LORazepam  (ATIVAN ) tablet 1-4 mg  1-4 mg Oral Q1H PRN Arnulfo Larch, MD       Or   LORazepam  (ATIVAN ) injection 1-4 mg  1-4 mg Intravenous Q1H PRN Segars, Jonathan, MD       melatonin tablet 6 mg  6 mg Oral QHS PRN Segars, Jonathan, MD       multivitamin with minerals tablet 1 tablet  1 tablet Oral Daily Segars, Arlyce Lambert, MD   1 tablet at 04/11/24 1000   ondansetron  (ZOFRAN ) injection 4 mg  4 mg Intravenous Q6H PRN Segars, Jonathan, MD       pantoprazole  (PROTONIX ) injection 40 mg  40 mg Intravenous BID Segars, Jonathan, MD   40 mg at 04/11/24 1000   sodium chloride  flush (NS) 0.9 % injection 3 mL  3 mL Intravenous Q12H Segars, Jonathan, MD   3 mL at 04/11/24 1008   thiamine  (VITAMIN B1) tablet 100 mg  100 mg Oral Daily Segars, Arlyce Lambert, MD   100 mg at 04/11/24 1000   Or    thiamine  (VITAMIN B1) injection 100 mg  100 mg Intravenous Daily Segars, Arlyce Lambert, MD       Current Outpatient Medications  Medication Sig Dispense Refill   amLODipine  (NORVASC ) 10 MG tablet Take 10 mg by mouth daily.     Multiple Vitamin (MULTIVITAMIN WITH MINERALS) TABS tablet Take 1 tablet by mouth daily. 90 tablet 3   ondansetron  (ZOFRAN ) 4 MG tablet Take 1 tablet (4 mg total) by mouth every 8 (eight) hours as needed for nausea or vomiting. 90 tablet 0   pantoprazole  (PROTONIX ) 40 MG tablet Take 2 tablets (80 mg total) by mouth daily. 90 tablet 1   Potassium 99 MG TABS Take 1 tablet by mouth daily.     amLODipine  (NORVASC ) 10 MG tablet Take 1 tablet (10 mg total) by mouth daily. 90 tablet 0    Allergies as of 04/10/2024 - Review Complete 04/10/2024  Allergen Reaction Noted   Ativan  [lorazepam ] Anxiety and Other (See Comments) 08/20/2023  Nsaids Other (See Comments) 08/18/2023    Family History  Problem Relation Age of Onset   Diabetes Mellitus II Father    Diabetes Mellitus II Other    CAD Other     Social History   Socioeconomic History   Marital status: Single    Spouse name: Not on file   Number of children: Not on file   Years of education: Not on file   Highest education level: Some college, no degree  Occupational History   Not on file  Tobacco Use   Smoking status: Former    Types: Cigarettes   Smokeless tobacco: Never   Tobacco comments:    About 1 cigarette per day or less  Vaping Use   Vaping status: Never Used  Substance and Sexual Activity   Alcohol use: Yes    Alcohol/week: 200.0 standard drinks of alcohol    Types: 200 Cans of beer per week   Drug use: Yes    Types: Marijuana   Sexual activity: Yes  Other Topics Concern   Not on file  Social History Narrative   Not on file   Social Drivers of Health   Financial Resource Strain: Medium Risk (12/25/2023)   Overall Financial Resource Strain (CARDIA)    Difficulty of Paying Living Expenses:  Somewhat hard  Food Insecurity: No Food Insecurity (03/21/2024)   Hunger Vital Sign    Worried About Running Out of Food in the Last Year: Never true    Ran Out of Food in the Last Year: Never true  Transportation Needs: Unmet Transportation Needs (03/21/2024)   PRAPARE - Administrator, Civil Service (Medical): Yes    Lack of Transportation (Non-Medical): No  Physical Activity: Unknown (11/07/2018)   Exercise Vital Sign    Days of Exercise per Week: Patient declined    Minutes of Exercise per Session: Patient declined  Stress: Stress Concern Present (12/25/2023)   [employer] of Occupational Health - Occupational Stress Questionnaire    Feeling of Stress : Rather much  Social Connections: Moderately Isolated (09/16/2023)   Social Connection and Isolation Panel [NHANES]    Frequency of Communication with Friends and Family: More than three times a week    Frequency of Social Gatherings with Friends and Family: Never    Attends Religious Services: More than 4 times per year    Active Member of [employer] or Organizations: No    Attends Banker Meetings: Never    Marital Status: Never married  Intimate Partner Violence: Not At Risk (03/21/2024)   Humiliation, Afraid, Rape, and Kick questionnaire    Fear of Current or Ex-Partner: No    Emotionally Abused: No    Physically Abused: No    Sexually Abused: No    Review of Systems: As per HPI  Physical Exam: Vital signs in last 24 hours: Temp:  [98 F (36.7 C)-99.4 F (37.4 C)] 98.6 F (37 C) (06/09 1000) Pulse Rate:  [81-101] 95 (06/09 1000) Resp:  [14-21] 14 (06/09 1000) BP: (137-163)/(83-106) 156/89 (06/09 1000) SpO2:  [93 %-100 %] 97 % (06/09 1000)    General:   Alert,  Well-developed, well-nourished, pleasant and cooperative in NAD Head:  Normocephalic and atraumatic. Eyes:  Sclera clear, no icterus.   Mild pallor  Ears:  Normal auditory acuity. Nose:  No deformity, discharge,  or lesions. Mouth:  Superficial mouth ulcers and prominent erythema of mucosa Neck:  Supple; no masses or thyromegaly. Lungs:  Clear throughout to auscultation.  No wheezes, crackles, or rhonchi. No acute distress. Heart:  Regular rate and rhythm; no murmurs, clicks, rubs,  or gallops. Extremities:  Without clubbing or edema. Neurologic:  Alert and  oriented x4;  grossly normal neurologically. Skin:  Intact without significant lesions or rashes. Psych:  Alert and cooperative. Normal mood and affect. Abdomen:  Soft, nontender and nondistended. No masses, hepatosplenomegaly or hernias noted. Normal bowel sounds, without guarding, and without rebound.         Lab Results: Recent Labs    04/10/24 2207 04/11/24 0520  WBC 7.3 7.2  HGB 12.8* 11.6*  HCT 36.9* 34.2*  PLT 182 128*   BMET Recent Labs    04/10/24 2207 04/11/24 0520  NA 134* 132*  K 3.5 3.8  CL 83* 91*  CO2 31 26  GLUCOSE 194* 132*  BUN 22* 19  CREATININE 2.23* 1.57*  CALCIUM  9.6 8.2*   LFT Recent Labs    04/11/24 0520  PROT 7.8  ALBUMIN  3.9  AST 45*  ALT 21  ALKPHOS 62  BILITOT 1.2  BILIDIR 0.3*  IBILI 0.9   PT/INR Recent Labs    04/11/24 0520  LABPROT 14.6  INR 1.1    Studies/Results: US  Abdomen Limited RUQ (LIVER/GB) Result Date: 04/11/2024 CLINICAL DATA:  Right upper quadrant pain.  History of gallstones. EXAM: ULTRASOUND ABDOMEN LIMITED RIGHT UPPER QUADRANT COMPARISON:  CT scan 08/24/2023 FINDINGS: Gallbladder: Multiple dependent gallstones evident measuring up to 11 mm. No gallbladder wall thickening or pericholecystic fluid. Sonographer reports no sonographic Murphy sign. Common bile duct: Diameter: 4-5 mm. Liver: No focal lesion identified. Liver parenchyma shows increased echogenicity. Portal vein is patent on color Doppler imaging with normal direction of blood flow towards the liver. Other: None. IMPRESSION: 1. Cholelithiasis without gallbladder wall thickening or pericholecystic fluid. 2. Increased hepatic  parenchymal echogenicity suggests fatty infiltration. Electronically Signed   By: Donnal Fusi M.D.   On: 04/11/2024 07:21    Impression: Hematemesis and coffee-ground emesis, hemoglobin 11.6 with platelet 128 and MCV 88.4 Alcohol abuse, AST 45 with normal T. bili, ALT and ALP, normal lipase, PT 14.6, INR 1.1, normal APTT, acetaminophen  and salicylate level unremarkable Marijuana abuse, positive U tox  Ultrasound shows cholelithiasis  Plan: Clear liquid diet, n.p.o. postmidnight for EGD in a.m. tomorrow Patient has been started on pantoprazole  40 mg IV twice a day Will add sucralfate  1 g suspension 4 times a day Patient has been started on folic acid , multivitamin and thiamine  Patient has been advised about stopping marijuana use and alcohol use.   LOS: 0 days   Genell Ken, MD  04/11/2024, 10:17 AM  ee

## 2024-04-11 NOTE — ED Notes (Signed)
 Pt ambulated to bathroom

## 2024-04-11 NOTE — Consult Note (Signed)
 [facility] Consult  Referring Provider: Hospitalist/Dr. Arnulfo Larch Primary Care Physician:  Marius Siemens, NP Primary Gastroenterologist: Para Bold  Reason for Consultation: Hematemesis, coffee-ground emesis  HPI: Richard Lopez is a 48 y.o. male was in his usual state of health until Friday evening, when he woke up from sleep with nausea and vomiting and noticed vomiting bright red blood as well as coffee-ground material.  This was associated with epigastric abdominal pain and cramping.  Discontinued on Saturday and also on Sunday which prompted him to come to the ER yesterday evening. Patient states he drinks alcohol, 1-2 beers on a regular basis and also smokes marijuana, last use of both were on Friday evening when his symptoms started. Patient had an EGD in 1/24 with me for odynophagia, dysphagia and hematemesis which showed severe esophagitis from 35 to 42 cm, multiple superficial ulcers biopsy showed acute esophagitis with ulcerations, no evidence of H. pylori or intestinal metaplasia EGD 08/2019, hematemesis, Dr. Honey Lusty: Grade C esophagitis, grade B esophagitis, small hiatal hernia and acute gastritis.   Past Medical History:  Diagnosis Date   Alcohol dependence with intoxication (HCC) 03/25/2019   Anxiety    Arthritis    hands and possibly knee   Atrial fibrillation with RVR (HCC) 04/22/2020   Closed right hip fracture, initial encounter (HCC) 03/25/2019   Depression    Diabetes mellitus    diet controlled   Esophagitis determined by endoscopy 08/21/2019   Essential hypertension    Gastritis    Gout    Intertrochanteric fracture of right hip (HCC) 04/14/2019   Normocytic anemia due to blood loss 08/21/2019   Pulmonary embolism [facility])     Past Surgical History:  Procedure Laterality Date   BIOPSY  11/12/2022   Procedure: BIOPSY;  Surgeon: Genell Ken, MD;  Location: [facility];  Service: Gastroenterology;;   ESOPHAGOGASTRODUODENOSCOPY (EGD) WITH  PROPOFOL  N/A 08/14/2019   Procedure: ESOPHAGOGASTRODUODENOSCOPY (EGD) WITH PROPOFOL ;  Surgeon: Baldo Bonds, MD;  Location: [facility];  Service: Endoscopy;  Laterality: N/A;   ESOPHAGOGASTRODUODENOSCOPY (EGD) WITH PROPOFOL  N/A 11/12/2022   Procedure: ESOPHAGOGASTRODUODENOSCOPY (EGD) WITH PROPOFOL ;  Surgeon: Genell Ken, MD;  Location: [facility];  Service: Gastroenterology;  Laterality: N/A;   EXTERNAL FIXATION LEG Left 11/07/2018   Procedure: EXTERNAL FIXATION LEFT LOWER LEG;  Surgeon: Adonica Hoose, MD;  Location: [facility];  Service: Orthopedics;  Laterality: Left;   EXTERNAL FIXATION REMOVAL Left 11/08/2018   Procedure: REMOVAL EXTERNAL FIXATION LEG;  Surgeon: Laneta Pintos, MD;  Location: [facility];  Service: Orthopedics;  Laterality: Left;   INTRAMEDULLARY (IM) NAIL INTERTROCHANTERIC Right 03/26/2019   Procedure: INTRAMEDULLARY (IM) NAIL INTERTROCHANTRIC;  Surgeon: Laneta Pintos, MD;  Location: [facility];  Service: Orthopedics;  Laterality: Right;   INTRAMEDULLARY (IM) NAIL INTERTROCHANTERIC Right 05/17/2019   Procedure: Intramedullary (Im) Nail Intertroch with circlage wiring;  Surgeon: Hardy Lia, MD;  Location: [facility];  Service: Orthopedics;  Laterality: Right;   NO PAST SURGERIES     OPEN REDUCTION INTERNAL FIXATION (ORIF) TIBIA/FIBULA FRACTURE Left 11/08/2018   Procedure: OPEN REDUCTION INTERNAL FIXATION (ORIF) TIBIA/FIBULA FRACTURE;  Surgeon: Laneta Pintos, MD;  Location: [facility];  Service: Orthopedics;  Laterality: Left;   ORIF FEMUR FRACTURE Right 05/17/2019   Procedure: REMOVAL  OF HARDWARE;  Surgeon: Hardy Lia, MD;  Location: [facility];  Service: Orthopedics;  Laterality: Right;    Prior to Admission medications   Medication Sig Start Date End Date Taking? Authorizing Provider  amLODipine  (NORVASC ) 10 MG tablet Take 10 mg  by mouth daily.   Yes [provider]  Multiple Vitamin (MULTIVITAMIN WITH MINERALS) TABS tablet Take 1 tablet by mouth daily. 03/02/24  Yes Marius Siemens, NP  ondansetron  (ZOFRAN ) 4 MG tablet Take 1 tablet (4 mg total) by mouth every 8 (eight) hours as needed for nausea or vomiting. 03/18/24  Yes Haydee Lipa, MD  pantoprazole  (PROTONIX ) 40 MG tablet Take 2 tablets (80 mg total) by mouth daily. 03/02/24  Yes Marius Siemens, NP  Potassium 99 MG TABS Take 1 tablet by mouth daily.   Yes [provider]  amLODipine  (NORVASC ) 10 MG tablet Take 1 tablet (10 mg total) by mouth daily. 12/16/23 03/21/24  Unk Garb, DO    Current Facility-Administered Medications  Medication Dose Route Frequency Provider Last Rate Last Admin   0.9 %  sodium chloride  infusion   Intravenous Continuous Segars, Jonathan, MD 100 mL/hr at 04/11/24 0151 New Bag at 04/11/24 0151   acetaminophen  (TYLENOL ) tablet 1,000 mg  1,000 mg Oral Q6H PRN Segars, Jonathan, MD       benzonatate (TESSALON) capsule 200 mg  200 mg Oral TID PRN Segars, Jonathan, MD   200 mg at 04/11/24 0505   fluticasone  (FLONASE ) 50 MCG/ACT nasal spray 2 spray  2 spray Each Nare Daily Segars, Arlyce Lambert, MD       folic acid  (FOLVITE ) tablet 1 mg  1 mg Oral Daily Segars, Jonathan, MD   1 mg at 04/11/24 1000   LORazepam  (ATIVAN ) tablet 1-4 mg  1-4 mg Oral Q1H PRN Arnulfo Larch, MD       Or   LORazepam  (ATIVAN ) injection 1-4 mg  1-4 mg Intravenous Q1H PRN Segars, Jonathan, MD       melatonin tablet 6 mg  6 mg Oral QHS PRN Segars, Jonathan, MD       multivitamin with minerals tablet 1 tablet  1 tablet Oral Daily Segars, Arlyce Lambert, MD   1 tablet at 04/11/24 1000   ondansetron  (ZOFRAN ) injection 4 mg  4 mg Intravenous Q6H PRN Segars, Jonathan, MD       pantoprazole  (PROTONIX ) injection 40 mg  40 mg Intravenous BID Segars, Jonathan, MD   40 mg at 04/11/24 1000   sodium chloride  flush (NS) 0.9 % injection 3 mL  3 mL Intravenous Q12H Segars, Jonathan, MD   3 mL at 04/11/24 1008   thiamine  (VITAMIN B1) tablet 100 mg  100 mg Oral Daily Segars, Arlyce Lambert, MD   100 mg at 04/11/24 1000   Or    thiamine  (VITAMIN B1) injection 100 mg  100 mg Intravenous Daily Segars, Arlyce Lambert, MD       Current Outpatient Medications  Medication Sig Dispense Refill   amLODipine  (NORVASC ) 10 MG tablet Take 10 mg by mouth daily.     Multiple Vitamin (MULTIVITAMIN WITH MINERALS) TABS tablet Take 1 tablet by mouth daily. 90 tablet 3   ondansetron  (ZOFRAN ) 4 MG tablet Take 1 tablet (4 mg total) by mouth every 8 (eight) hours as needed for nausea or vomiting. 90 tablet 0   pantoprazole  (PROTONIX ) 40 MG tablet Take 2 tablets (80 mg total) by mouth daily. 90 tablet 1   Potassium 99 MG TABS Take 1 tablet by mouth daily.     amLODipine  (NORVASC ) 10 MG tablet Take 1 tablet (10 mg total) by mouth daily. 90 tablet 0    Allergies as of 04/10/2024 - Review Complete 04/10/2024  Allergen Reaction Noted   Ativan  [lorazepam ] Anxiety and Other (See Comments) 08/20/2023  Nsaids Other (See Comments) 08/18/2023    Family History  Problem Relation Age of Onset   Diabetes Mellitus II Father    Diabetes Mellitus II Other    CAD Other     Social History   Socioeconomic History   Marital status: Single    Spouse name: Not on file   Number of children: Not on file   Years of education: Not on file   Highest education level: Some college, no degree  Occupational History   Not on file  Tobacco Use   Smoking status: Former    Types: Cigarettes   Smokeless tobacco: Never   Tobacco comments:    About 1 cigarette per day or less  Vaping Use   Vaping status: Never Used  Substance and Sexual Activity   Alcohol use: Yes    Alcohol/week: 200.0 standard drinks of alcohol    Types: 200 Cans of beer per week   Drug use: Yes    Types: Marijuana   Sexual activity: Yes  Other Topics Concern   Not on file  Social History Narrative   Not on file   Social Drivers of Health   Financial Resource Strain: Medium Risk (12/25/2023)   Overall Financial Resource Strain (CARDIA)    Difficulty of Paying Living Expenses:  Somewhat hard  Food Insecurity: No Food Insecurity (03/21/2024)   Hunger Vital Sign    Worried About Running Out of Food in the Last Year: Never true    Ran Out of Food in the Last Year: Never true  Transportation Needs: Unmet Transportation Needs (03/21/2024)   PRAPARE - Administrator, Civil Service (Medical): Yes    Lack of Transportation (Non-Medical): No  Physical Activity: Unknown (11/07/2018)   Exercise Vital Sign    Days of Exercise per Week: Patient declined    Minutes of Exercise per Session: Patient declined  Stress: Stress Concern Present (12/25/2023)   [employer] of Occupational Health - Occupational Stress Questionnaire    Feeling of Stress : Rather much  Social Connections: Moderately Isolated (09/16/2023)   Social Connection and Isolation Panel [NHANES]    Frequency of Communication with Friends and Family: More than three times a week    Frequency of Social Gatherings with Friends and Family: Never    Attends Religious Services: More than 4 times per year    Active Member of [employer] or Organizations: No    Attends Banker Meetings: Never    Marital Status: Never married  Intimate Partner Violence: Not At Risk (03/21/2024)   Humiliation, Afraid, Rape, and Kick questionnaire    Fear of Current or Ex-Partner: No    Emotionally Abused: No    Physically Abused: No    Sexually Abused: No    Review of Systems: As per HPI  Physical Exam: Vital signs in last 24 hours: Temp:  [98 F (36.7 C)-99.4 F (37.4 C)] 98.6 F (37 C) (06/09 1000) Pulse Rate:  [81-101] 95 (06/09 1000) Resp:  [14-21] 14 (06/09 1000) BP: (137-163)/(83-106) 156/89 (06/09 1000) SpO2:  [93 %-100 %] 97 % (06/09 1000)    General:   Alert,  Well-developed, well-nourished, pleasant and cooperative in NAD Head:  Normocephalic and atraumatic. Eyes:  Sclera clear, no icterus.   Mild pallor  Ears:  Normal auditory acuity. Nose:  No deformity, discharge,  or lesions. Mouth:  Superficial mouth ulcers and prominent erythema of mucosa Neck:  Supple; no masses or thyromegaly. Lungs:  Clear throughout to auscultation.  No wheezes, crackles, or rhonchi. No acute distress. Heart:  Regular rate and rhythm; no murmurs, clicks, rubs,  or gallops. Extremities:  Without clubbing or edema. Neurologic:  Alert and  oriented x4;  grossly normal neurologically. Skin:  Intact without significant lesions or rashes. Psych:  Alert and cooperative. Normal mood and affect. Abdomen:  Soft, nontender and nondistended. No masses, hepatosplenomegaly or hernias noted. Normal bowel sounds, without guarding, and without rebound.         Lab Results: Recent Labs    04/10/24 2207 04/11/24 0520  WBC 7.3 7.2  HGB 12.8* 11.6*  HCT 36.9* 34.2*  PLT 182 128*   BMET Recent Labs    04/10/24 2207 04/11/24 0520  NA 134* 132*  K 3.5 3.8  CL 83* 91*  CO2 31 26  GLUCOSE 194* 132*  BUN 22* 19  CREATININE 2.23* 1.57*  CALCIUM  9.6 8.2*   LFT Recent Labs    04/11/24 0520  PROT 7.8  ALBUMIN  3.9  AST 45*  ALT 21  ALKPHOS 62  BILITOT 1.2  BILIDIR 0.3*  IBILI 0.9   PT/INR Recent Labs    04/11/24 0520  LABPROT 14.6  INR 1.1    Studies/Results: US  Abdomen Limited RUQ (LIVER/GB) Result Date: 04/11/2024 CLINICAL DATA:  Right upper quadrant pain.  History of gallstones. EXAM: ULTRASOUND ABDOMEN LIMITED RIGHT UPPER QUADRANT COMPARISON:  CT scan 08/24/2023 FINDINGS: Gallbladder: Multiple dependent gallstones evident measuring up to 11 mm. No gallbladder wall thickening or pericholecystic fluid. Sonographer reports no sonographic Murphy sign. Common bile duct: Diameter: 4-5 mm. Liver: No focal lesion identified. Liver parenchyma shows increased echogenicity. Portal vein is patent on color Doppler imaging with normal direction of blood flow towards the liver. Other: None. IMPRESSION: 1. Cholelithiasis without gallbladder wall thickening or pericholecystic fluid. 2. Increased hepatic  parenchymal echogenicity suggests fatty infiltration. Electronically Signed   By: Donnal Fusi M.D.   On: 04/11/2024 07:21    Impression: Hematemesis and coffee-ground emesis, hemoglobin 11.6 with platelet 128 and MCV 88.4 Alcohol abuse, AST 45 with normal T. bili, ALT and ALP, normal lipase, PT 14.6, INR 1.1, normal APTT, acetaminophen  and salicylate level unremarkable Marijuana abuse, positive U tox  Ultrasound shows cholelithiasis  Plan: Clear liquid diet, n.p.o. postmidnight for EGD in a.m. tomorrow Patient has been started on pantoprazole  40 mg IV twice a day Will add sucralfate  1 g suspension 4 times a day Patient has been started on folic acid , multivitamin and thiamine  Patient has been advised about stopping marijuana use and alcohol use.   LOS: 0 days   Genell Ken, MD  04/11/2024, 10:17 AM  ee

## 2024-04-11 NOTE — Progress Notes (Signed)
 PROGRESS NOTE Richard Lopez  EAV:[id number] DOB: [birth year] DOA: 04/10/2024 PCP: Marius Siemens, NP  Brief Narrative/Hospital Course: 48 y.o. male with hx of gastritis and esophagitis, esophageal ulcers, suspected cannabis hyperemesis, with continued THC use, alcohol abuse, gallstones, hypertension, prediabetes, fatty liver, paroxysmal Afib, history of PE completed anticoagulation, mood disorder, recent admission from 5/14-16 with nausea and vomiting felt to be related to cannabis hyperemesis presented with acute onset of abdominal pain, nausea, vomiting with multiple episodes of vomiting, some with mixed bright red blood. In the ED: Tachycardic BP stable afebrile  labs: lactate 2.8 -> 1.8 VBG 7.58/41 Bicarb 31, AG 20 Creatinine 2.2 baseline ~0.6-0.7  T. bili 1.5 not fractionated, AST 55, lipase within normal limits Patient  Subjective: Patient seen and examined this morning Complains of some nausea, central abdominal pain No BM or diarrhea Vital stable, labs shows creatinine further down 1.5 mild hyponatremia CBC with hemoglobin 11.5  Assessment and plan:  Intractable nausea vomiting Recurrent admission for similar issues Cannabis hyperemesis syndrome Upper GI bleeding:  has significant dyspepsia and reflux symptoms.  Few episodes of emesis w/ mixed bright red blood. GI has been consulted, continue IV fluid hydration, PPI twice daily, Carafate -clear liquid diet, npo past Mn for EGD. Trend hemoglobin. Last endoscopy in 1/' 24 with severe esophagitis, esophageal ulcers, abnormal nodular GE junction, gastritis pathology send was negative for H. Pylori, or fungal / viral changes of esophageal ulcer, gastritis and esophagitis seen without other pathologic change.  Advise marijuana cessation  Fatty liver/ mild alcoholic hepatitis Cholelithiasis without gallbladder thickening: RUQ us  done. Monitor lfts.  Continue alcohol cessation  AKI stage IIIa Metabolic alkalosis, likely secondary to  contraction and gastric fluid loss B/l creat ~0.6-.7.Suspect prerenal in the setting of GI losses. Improving.  Continue IV fluids. Recent Labs    12/14/23 1620 12/15/23 0455 12/16/23 0433 03/16/24 0600 03/16/24 1635 03/17/24 0025 03/17/24 0544 03/18/24 0514 04/10/24 2207 04/11/24 0520  BUN 16 19 13 16 14 12 9 8  22* 19  CREATININE 2.18* 1.85* 0.98 1.09 0.81 0.74 0.59* 0.76 2.23* 1.57*  CO2 22 23 22 29 28 26 25 25 31  26  K 4.3 3.4* 3.4* 2.7* 3.0* 3.0* 3.2* 3.2* 3.5 3.8     Alcohol use disorder THC use disorder: denies heavy alcohol use has a history of severe alcohol withdrawal requiring ICU admission in the past. Cont ciwa, thiamine  folate MV Counseled on THC cessation Fu Drug screen pending   Hypokalemia Resolved  Hypertension: No recent fill of amlodipine -holding  Prediabetes: Last A1c 5.7.  Diet controlled  Hx paroxysmal Afib: NSR. not on [name] outpatient.   History of PE/DVT: in 10/'20, setting of hip fracture. completed anticoagulation  Mood disorder: Not on medication  .DVT prophylaxis: SCDs Start: 04/11/24 0118 Code Status:   Code Status: Full Code Family Communication: plan of care discussed with patient at bedside. Patient status is: Remains hospitalized because of severity of illness Level of care: Telemetry   Dispo: The patient is from: home            Anticipated disposition: TBD Objective: Vitals last 24 hrs: Vitals:   04/11/24 0300 04/11/24 0600 04/11/24 0900 04/11/24 1000  BP: (!) 159/97 137/89 (!) 145/83 (!) 156/89  Pulse: 81 86 81 95  Resp: 20 18 19 14   Temp: 98 F (36.7 C) 98 F (36.7 C)  98.6 F (37 C)  TempSrc:    Oral  SpO2: 98% 100% 99% 97%    Physical Examination: General exam: alert awake,  older than stated age HEENT:Oral mucosa moist, Ear/Nose WNL grossly Respiratory system: Bilaterally clear BS, no use of accessory muscle Cardiovascular system: S1 & S2 +. Gastrointestinal system: Abdomen soft, mildly tender in the central  abdomen Nervous System: Alert, awake,  following commands. Extremities: LE edema neg, warm extremities Skin: No rashes,warm. MSK: Normal muscle bulk/tone.   Data Reviewed: I have personally reviewed following labs and imaging studies ( see epic result tab) CBC: Recent Labs  Lab 04/10/24 2207 04/11/24 0520  WBC 7.3 7.2  HGB 12.8* 11.6*  HCT 36.9* 34.2*  MCV 85.6 88.4  PLT 182 128*   CMP: Recent Labs  Lab 04/10/24 2207 04/11/24 0520  NA 134* 132*  K 3.5 3.8  CL 83* 91*  CO2 31 26  GLUCOSE 194* 132*  BUN 22* 19  CREATININE 2.23* 1.57*  CALCIUM  9.6 8.2*  MG  --  1.8  PHOS  --  2.8   GFR: CrCl cannot be calculated (Unknown ideal weight.). Recent Labs  Lab 04/10/24 2207 04/11/24 0520  AST 55* 45*  ALT 28 21  ALKPHOS 78 62  BILITOT 1.5* 1.2  PROT 9.6* 7.8  ALBUMIN  5.0 3.9    Recent Labs  Lab 04/10/24 2207  LIPASE 37   No results for input(s): "AMMONIA" in the last 168 hours. Coagulation Profile:  Recent Labs  Lab 04/11/24 0520  INR 1.1   Unresulted Labs (From admission, onward)     Start     Ordered   04/12/24 0500  Type and screen  Once,   R        04/11/24 0228           Antimicrobials/Microbiology: Anti-infectives (From admission, onward)    None         Component Value Date/Time   SDES  08/24/2023 1647    URINE, RANDOM Performed at [facility], [address] [place], [place] [address]    SPECREQUEST  08/24/2023 1647    NONE Reflexed from [id number] Performed at [facility], [address], [place], [place] [address]    CULT (A) 08/24/2023 1647    <10,000 COLONIES/mL INSIGNIFICANT GROWTH Performed at [facility], [address] [place], [place] [address]    REPTSTATUS 08/25/2023 FINAL 08/24/2023 1647    Procedures: Procedure(s) (LRB): EGD (ESOPHAGOGASTRODUODENOSCOPY) (N/A) Medications reviewed:  Scheduled Meds:  fluticasone   2 spray Each Nare Daily   folic acid   1 mg Oral Daily    multivitamin with minerals  1 tablet Oral Daily   pantoprazole  (PROTONIX ) IV  40 mg Intravenous BID   sodium chloride  flush  3 mL Intravenous Q12H   sucralfate   1 g Oral TID WC & HS   thiamine   100 mg Oral Daily   Or   thiamine   100 mg Intravenous Daily   Continuous Infusions:  sodium chloride  100 mL/hr at 04/11/24 0151    Lesa Rape, MD [facility] 04/11/2024, 12:07 PM

## 2024-04-11 NOTE — Hospital Course (Addendum)
 48 y.o. male with hx of gastritis and esophagitis, esophageal ulcers, suspected cannabis hyperemesis, with continued THC use, alcohol abuse, gallstones, hypertension, prediabetes, fatty liver, paroxysmal Afib, history of PE completed anticoagulation, mood disorder, recent admission from 5/14-16 with nausea and vomiting felt to be related to cannabis hyperemesis presented with acute onset of abdominal pain, nausea, vomiting with multiple episodes of vomiting, some with mixed bright red blood. In the ED: Tachycardic BP stable afebrile  labs: lactate 2.8 -> 1.8 VBG 7.58/41 Bicarb 31, AG 20 Creatinine 2.2 baseline ~0.6-0.7  T. bili 1.5 not fractionated, AST 55, lipase within normal limits. Patient was admitted manage IV fluids antiemetics pain control, seen by GI Underwent EGD see finding below diet is being advanced Tolerating po altho some burning while eating. Agreeable for dc home today  Subjective: Seen examined Tolerating po altho some burning while eating Wants one more inj of solumedrol before dc for his gout Overnight events afebrile BP stable on room air Labs reviewed mild hypokalemia otherwise stable with chronic thrombocytopenia  Discharge Diagnoses Intractable nausea vomiting Recurrent admission for similar issues Cannabis hyperemesis syndrome Severe esophagitis/esophageal ulcers gastritis on last EGD and negative negative for H. Pylori:  has significant dyspepsia and reflux symptoms.  Few episodes of emesis w/ mixed bright red blood. GI following closely-treated with IVF PPI Carafate  pain management and antiemetics - HB Stable EGD 6/10>EGD showed severe esophagitis, esophageal ulcers, severe erythema in gastric cardia likely from retching. Gi Have taken biopsies from esophagus to rule out HSV/CMV, biopsies from stomach to rule out H. pylori, biopsies from small bowel to rule out celiac. He will need to be compliant with PPI twice a day for 2 months and sucralfate  4 times a day for 1  month. Biopsies can be followed as outpatient. I have advised him to stop marijuana use and alcohol use.  Tolerating diet and okay for discharge today  Fatty liver/ mild alcoholic hepatitis Cholelithiasis without gallbladder thickening: RUQ shows cholelithiasis without gallbladder wall thickening or pericholecystic fluid.lfts improved. Continue alcohol cessation  AKI stage IIIa w/ peak to 2.2 Metabolic alkalosis, likely secondary to contraction and gastric fluid loss B/l creat ~0.6-.7.Suspect prerenal in the setting of GI losses.  It has resolved.  Encourage oral intake Recent Labs    12/16/23 0433 03/16/24 0600 03/16/24 1635 03/17/24 0025 03/17/24 0544 03/18/24 0514 04/10/24 2207 04/11/24 0520 04/12/24 0531 04/13/24 0545  BUN 13 16 14 12 9 8  22* 19 11 7   CREATININE 0.98 1.09 0.81 0.74 0.59* 0.76 2.23* 1.57* 0.83 0.69  CO2 22 29 28 26 25 25 31 26 27  27  K 3.4* 2.7* 3.0* 3.0* 3.2* 3.2* 3.5 3.8 3.6 3.2*     Alcohol use disorder THC use disorder: denies heavy alcohol use has a history of severe alcohol withdrawal requiring ICU admission in the past. Cont ciwa, thiamine  folate MV Counseled on THC cessation   Hypokalemia Replaced.  Gout with tophi: Given Solu-Medrol  with improvement, will redose with Solu-Medrol  x 1 for discharge discussed about minimizing NSAIDs as well as steroid in the setting of his severe esophagitis and risk of ulceration and perforation  Hypertension: No recent fill of amlodipine -holding  Prediabetes: Last A1c 5.7.  Diet controlled  Hx paroxysmal Afib: NSR. not on [facility] outpatient.   History of PE/DVT: in 10/'20, setting of hip fracture. completed anticoagulation  Mood disorder: Not on medication

## 2024-04-11 NOTE — ED Notes (Signed)
 Md at bedside

## 2024-04-11 NOTE — Plan of Care (Signed)

## 2024-04-11 NOTE — Plan of Care (Signed)
  Problem: Clinical Measurements: Goal: Will remain free from infection Outcome: Progressing Goal: Cardiovascular complication will be avoided Outcome: Progressing   Problem: Nutrition: Goal: Adequate nutrition will be maintained Outcome: Progressing

## 2024-04-11 NOTE — Anesthesia Preprocedure Evaluation (Signed)
 Anesthesia Evaluation  Patient identified by MRN, date of birth, ID band Patient awake    Reviewed: Allergy & Precautions, NPO status , Patient's Chart, lab work & pertinent test results  History of Anesthesia Complications Negative for: history of anesthetic complications  Airway Mallampati: II  TM Distance: >3 FB Neck ROM: Full    Dental no notable dental hx. (+) Teeth Intact, Dental Advisory Given, Poor Dentition,    Pulmonary former smoker, PE (2021)   Pulmonary exam normal breath sounds clear to auscultation       Cardiovascular hypertension, (-) angina (-) Past MI Normal cardiovascular exam+ dysrhythmias Atrial Fibrillation  Rhythm:Regular Rate:Normal     Neuro/Psych  PSYCHIATRIC DISORDERS Anxiety Depression       GI/Hepatic negative GI ROS, Neg liver ROS,GERD  Medicated and Controlled,,Lab Results      Component                Value               Date                      ALT                      26                  11/10/2022                AST                      60 (H)              11/10/2022                ALKPHOS                  50                  11/10/2022                BILITOT                  1.0                 11/10/2022              Endo/Other  diabetes    Renal/GU Renal InsufficiencyRenal diseaseLab Results      Component                Value               Date                      NA                       132 (L)             04/11/2024                CL                       91 (L)              04/11/2024                K  3.8                 04/11/2024                         CREATININE               1.57 (H)            04/11/2024                GFRNONAA                 55 (L)              04/11/2024                   Musculoskeletal  (+) Arthritis , Rheumatoid disorders,    Abdominal   Peds  Hematology  (+) Blood dyscrasia, anemia Lab Results      Component                 Value               Date                      WBC                      7.2                 04/11/2024                HGB                      11.6 (L)            04/11/2024                HCT                      34.2 (L)            04/11/2024                MCV                      88.4                04/11/2024                PLT                      128 (L)             04/11/2024              Anesthesia Other Findings All: ativan  NSAIDS  Reproductive/Obstetrics                             Anesthesia Physical Anesthesia Plan  ASA: 3  Anesthesia Plan: MAC   Post-op Pain Management: Minimal or no pain anticipated   Induction: Intravenous  PONV Risk Score and Plan: Propofol  infusion and Treatment may vary due to age or medical condition  Airway Management Planned: Nasal Cannula and Natural Airway  Additional Equipment: None  Intra-op Plan:   Post-operative Plan:   Informed Consent: I have reviewed the patients History and Physical, chart, labs and discussed the procedure including the risks,  benefits and alternatives for the proposed anesthesia with the patient or authorized representative who has indicated his/her understanding and acceptance.     Dental advisory given  Plan Discussed with: CRNA and Surgeon  Anesthesia Plan Comments: (hematemesis, coffee ground emesis)        Anesthesia Quick Evaluation

## 2024-04-11 NOTE — H&P (Addendum)
 History and Physical    Richard Lopez ZOX:[id number] DOB: 1976-02-15 DOA: 04/10/2024  PCP: Richard Lopez   Patient coming from: Home   Chief Complaint: Nausea / vomiting    HPI:  Richard Lopez is a 48 y.o. male with hx of gastritis and esophagitis, esophageal ulcers, suspected cannabis hyperemesis, with continued THC use, alcohol abuse, gallstones, hypertension, prediabetes, fatty liver, paroxysmal Afib, history of PE completed anticoagulation, mood disorder, recent admission from 5/14-16 with nausea and vomiting felt to be related to cannabis hyperemesis.  Presents with acute onset of abdominal pain, nausea, vomiting.  Reports onset Saturday evening.  Since onset and multiple episodes of vomiting, some with mixed bright red blood.  Constipated denies melena or hematochezia.  Has epigastric abdominal pain described as a burning which radiates up through his chest.  Reports recent alcohol use but none in the past few days denies any ongoing heavy alcohol use.  Did smoke weed on Saturday, reports ongoing use on a regular basis.  Review of Systems:  ROS complete and negative except as marked above   Allergies  Allergen Reactions   Ativan  [Lorazepam ] Anxiety and Other (See Comments)    Hallucinations   Nsaids Other (See Comments)    Hx of esophageal ulcer    Prior to Admission medications   Medication Sig Start Date End Date Taking? Authorizing Provider  amLODipine  (NORVASC ) 10 MG tablet Take 10 mg by mouth daily.   Yes [provider]  Multiple Vitamin (MULTIVITAMIN WITH MINERALS) TABS tablet Take 1 tablet by mouth daily. 03/02/24  Yes Richard Lopez  ondansetron  (ZOFRAN ) 4 MG tablet Take 1 tablet (4 mg total) by mouth every 8 (eight) hours as needed for nausea or vomiting. 03/18/24  Yes Richard Lipa, MD  pantoprazole  (PROTONIX ) 40 MG tablet Take 2 tablets (80 mg total) by mouth daily. 03/02/24  Yes Richard Lopez  Potassium 99 MG TABS Take 1  tablet by mouth daily.   Yes [provider]  amLODipine  (NORVASC ) 10 MG tablet Take 1 tablet (10 mg total) by mouth daily. 12/16/23 03/21/24  Richard Garb, DO    Past Medical History:  Diagnosis Date   Alcohol dependence with intoxication (HCC) 03/25/2019   Anxiety    Arthritis    hands and possibly knee   Atrial fibrillation with RVR (HCC) 04/22/2020   Closed right hip fracture, initial encounter (HCC) 03/25/2019   Depression    Diabetes mellitus    diet controlled   Esophagitis determined by endoscopy 08/21/2019   Essential hypertension    Gastritis    Gout    Intertrochanteric fracture of right hip (HCC) 04/14/2019   Normocytic anemia due to blood loss 08/21/2019   Pulmonary embolism [facility])     Past Surgical History:  Procedure Laterality Date   BIOPSY  11/12/2022   Procedure: BIOPSY;  Surgeon: Richard Ken, MD;  Location: [facility] ENDOS[facility];  Service: Gastroenterology;;   ESOPHAGOGASTRODUODENOSCOPY (EGD) WITH PROPOFOL  N/A 08/14/2019   Procedure: ESOPHAGOGASTRODUODENOSCOPY (EGD) WITH PROPOFOL ;  Surgeon: Richard Bonds, MD;  Location: [facility] ENDOS[facility];  Service: Endoscopy;  Laterality: N/A;   ESOPHAGOGASTRODUODENOSCOPY (EGD) WITH PROPOFOL  N/A 11/12/2022   Procedure: ESOPHAGOGASTRODUODENOSCOPY (EGD) WITH PROPOFOL ;  Surgeon: Richard Ken, MD;  Location: [facility];  Service: Gastroenterology;  Laterality: N/A;   EXTERNAL FIXATION LEG Left 11/07/2018   Procedure: EXTERNAL FIXATION LEFT LOWER LEG;  Surgeon: Richard Hoose, MD;  Location: [facility] ORS;  Service: Orthopedics;  Laterality: Left;   EXTERNAL FIXATION REMOVAL Left  11/08/2018   Procedure: REMOVAL EXTERNAL FIXATION LEG;  Surgeon: Richard Pintos, MD;  Location: [facility] OR;  Service: Orthopedics;  Laterality: Left;   INTRAMEDULLARY (IM) NAIL INTERTROCHANTERIC Right 03/26/2019   Procedure: INTRAMEDULLARY (IM) NAIL INTERTROCHANTRIC;  Surgeon: Richard Pintos, MD;  Location: [facility] OR;  Service: Orthopedics;  Laterality: Right;   INTRAMEDULLARY  (IM) NAIL INTERTROCHANTERIC Right 05/17/2019   Procedure: Intramedullary (Im) Nail Intertroch with circlage wiring;  Surgeon: Richard Lia, MD;  Location: [facility] OR;  Service: Orthopedics;  Laterality: Right;   NO PAST SURGERIES     OPEN REDUCTION INTERNAL FIXATION (ORIF) TIBIA/FIBULA FRACTURE Left 11/08/2018   Procedure: OPEN REDUCTION INTERNAL FIXATION (ORIF) TIBIA/FIBULA FRACTURE;  Surgeon: Richard Pintos, MD;  Location: [facility] OR;  Service: Orthopedics;  Laterality: Left;   ORIF FEMUR FRACTURE Right 05/17/2019   Procedure: REMOVAL  OF HARDWARE;  Surgeon: Richard Lia, MD;  Location: [facility] OR;  Service: Orthopedics;  Laterality: Right;     reports that he has quit smoking. His smoking use included cigarettes. He has never used smokeless tobacco. He reports current alcohol use of about 200.0 standard drinks of alcohol per week. He reports current drug use. Drug: Marijuana.  Family History  Problem Relation Age of Onset   Diabetes Mellitus II Father    Diabetes Mellitus II Other    CAD Other      Physical Exam: Vitals:   04/10/24 2112 04/10/24 2254 04/11/24 0137  BP:  (!) 145/95 (!) 163/106  Pulse:  (!) 101 93  Resp:  (!) 21   Temp: 99.4 F (37.4 C)  98 F (36.7 C)  TempSrc: Oral    SpO2:  93%     Gen: Awake, alert, acutely ill-appearing CV: Regular, normal S1, S2, no murmurs  Resp: Normal WOB, CTAB  Abd: Flat, normoactive, mild epigastric tenderness, no rebound, guarding, rigidity MSK: Symmetric, no edema  Skin: No rashes or lesions to exposed skin  Neuro: Alert and interactive  Psych: euthymic, appropriate    Data review:   Labs reviewed, notable for:   Lactate 2.8 -> 1.8 VBG 7.58/41 Bicarb 31, AG 20 Creatinine 2.2 baseline ~0.6-0.7  T. bili 1.5 not fractionated, AST 55, lipase within normal limits   Micro:  Results for orders placed or performed during the hospital encounter of 12/11/23  Resp panel by RT-PCR (RSV, Flu A&B, Covid) Anterior Nasal Swab     Status: None    Collection Time: 12/11/23  4:12 PM   Specimen: Anterior Nasal Swab  Result Value Ref Range Status   SARS Coronavirus 2 by RT PCR NEGATIVE NEGATIVE Final    Comment: (NOTE) SARS-CoV-2 target nucleic acids are NOT DETECTED.  The SARS-CoV-2 RNA is generally detectable in upper respiratory specimens during the acute phase of infection. The lowest concentration of SARS-CoV-2 viral copies this assay can detect is 138 copies/mL. A negative result does not preclude SARS-Cov-2 infection and should not be used as the sole basis for treatment or other patient management decisions. A negative result may occur with  improper specimen collection/handling, submission of specimen other than nasopharyngeal swab, presence of viral mutation(s) within the areas targeted by this assay, and inadequate number of viral copies(<138 copies/mL). A negative result must be combined with clinical observations, patient history, and epidemiological information. The expected result is Negative.  Fact Sheet for Patients:  BloggerCourse.com  Fact Sheet for Healthcare Providers:  SeriousBroker.it  This test is no t yet approved or cleared by the [place]  FDA and  has been  authorized for detection and/or diagnosis of SARS-CoV-2 by FDA under an Emergency Use Authorization (EUA). This EUA will remain  in effect (meaning this test can be used) for the duration of the COVID-19 declaration under Section 564(b)(1) of the Act, 21 U.S.C.section 360bbb-3(b)(1), unless the authorization is terminated  or revoked sooner.       Influenza A by PCR NEGATIVE NEGATIVE Final   Influenza B by PCR NEGATIVE NEGATIVE Final    Comment: (NOTE) The Xpert Xpress SARS-CoV-2/FLU/RSV plus assay is intended as an aid in the diagnosis of influenza from Nasopharyngeal swab specimens and should not be used as a sole basis for treatment. Nasal washings and aspirates are unacceptable for  Xpert Xpress SARS-CoV-2/FLU/RSV testing.  Fact Sheet for Patients: BloggerCourse.com  Fact Sheet for Healthcare Providers: SeriousBroker.it  This test is not yet approved or cleared by the [place]  FDA and has been authorized for detection and/or diagnosis of SARS-CoV-2 by FDA under an Emergency Use Authorization (EUA). This EUA will remain in effect (meaning this test can be used) for the duration of the COVID-19 declaration under Section 564(b)(1) of the Act, 21 U.S.C. section 360bbb-3(b)(1), unless the authorization is terminated or revoked.     Resp Syncytial Virus by PCR NEGATIVE NEGATIVE Final    Comment: (NOTE) Fact Sheet for Patients: BloggerCourse.com  Fact Sheet for Healthcare Providers: SeriousBroker.it  This test is not yet approved or cleared by the [place]  FDA and has been authorized for detection and/or diagnosis of SARS-CoV-2 by FDA under an Emergency Use Authorization (EUA). This EUA will remain in effect (meaning this test can be used) for the duration of the COVID-19 declaration under Section 564(b)(1) of the Act, 21 U.S.C. section 360bbb-3(b)(1), unless the authorization is terminated or revoked.  Performed at [facility], [address], [place], [place] [address]     Imaging reviewed:  No results found.   EKG: Personally reviewed, sinus rhythm, QTc 484, no acute ischemic changes  ED Course:  Treated with 2 L IV fluid, Zofran    Assessment/Plan:  48 y.o. male with hx gastritis and esophagitis, esophageal ulcers, suspected cannabis hyperemesis, with continued THC use, alcohol abuse, gallstones, hypertension, prediabetes, fatty liver, paroxysmal Afib, history of PE completed anticoagulation, mood disorder, recent admission from 5/14-16 with nausea and vomiting felt to be related to cannabis hyperemesis.,  Who presents  with recurrent abdominal pain, nausea, vomiting associated with stage III AKI.  Abdominal pain, intractable nausea and vomiting Upper GI bleeding  Recurrent admissions for the same last 5/14-16 which was attributed to cannabis hyperemesis syndrome.  He has significant dyspepsia and reflux symptoms.  Few episodes of emesis described as mixed with bright red blood.  On initial evaluation tachycardic in the low 100s.  Labs demonstrating alkalosis likely 2/2 contraction and gastric fluid loss. VBG 7.58/41, bicarb 31, AG 20, lactic acid 2.8 -> 1.8 with IV fluids.  Associated AKI stage III.  Etiology of his symptoms suspect ongoing gastritis and esophagitis and likely component cannabis hyperemesis, considering his recurrent admissions question if may benefit from repeat EGD.  Last endoscopy in 1/' 24 with severe esophagitis, esophageal ulcers, abnormal nodular GE junction, gastritis; pathology send was negative for H. Pylori, or fungal / viral changes of esophageal ulcer, gastritis and esophagitis seen without other pathologic change.  - Messaged Dr. Earlyne [facility] for routine consult in [place] re: possible repeat endoscopy.  - N.p.o. with ice chips, sips with meds for now - S/p 2 L IV fluid, continue NS  MIVF at 100 cc an hour - Pantoprazole  40 mg IV BID  - Trend blood counts, check coags, type and screen in a.m. - Check right upper quadrant ultrasound with history of cholelithiasis - Advised on cessation of marijuana use  AKI stage III Baseline creatinine approximately 0.6-3.7.  Elevated to 2.2 on admission.  Suspect prerenal in the setting of GI losses. - IV fluids per above - Check UA, PVR  Metabolic alkalosis, likely secondary to contraction and gastric fluid loss VBG 7.58/41, bicarb 31, AG 20 -Normal saline per above, K repletion -Repeat VBG in a.m.  Mild liver injury, suspect alcohol related Minimal elevation AST 55, T. bili 1.5 - Trend LFT  Alcohol use disorder THC use  disorder -Although denies heavy alcohol use has a history of severe alcohol withdrawal requiring ICU admission in the past.  Monitor on CIWA with Ativan  as needed. -Thiamine , multivitamin, folate - Counseled on THC cessation -Drug screen pending  Hypokalemia - K repletion, empiric magnesium   Chronic medical problems: Hypertension: No recent fill of amlodipine -taking intended admission.  Hold for now in setting of volume depletion, may restart if remains hypertensive Prediabetes: Last A1c 5.7.  Diet controlled Fatty liver: Noted on imaging, dietary management, follow-up outpatient Hx paroxysmal Afib: SR on admission, not on [facility] outpatient.  History of PE/DVT: in 10/'20, setting of hip fracture. completed anticoagulation Mood disorder: Not on medication   There is no height or weight on file to calculate BMI.    DVT prophylaxis:  SCDs Code Status:  Full Code Diet:  Diet Orders (From admission, onward)     Start     Ordered   04/11/24 0118  Diet NPO time specified Except for: Sips with Meds, Ice Chips  Diet effective now       Question Answer Comment  Except for Sips with Meds   Except for Ice Chips      04/11/24 0120           Family Communication:  None   Consults:  [facility]   Admission status:   Inpatient, Telemetry bed  Severity of Illness: The appropriate patient status for this patient is INPATIENT. Inpatient status is judged to be reasonable and necessary in order to provide the required intensity of service to ensure the patient's safety. The patient's presenting symptoms, physical exam findings, and initial radiographic and laboratory data in the context of their chronic comorbidities is felt to place them at high risk for further clinical deterioration. Furthermore, it is not anticipated that the patient will be medically stable for discharge from the hospital within 2 midnights of admission.   * I certify that at the point of admission it is my clinical judgment  that the patient will require inpatient hospital care spanning beyond 2 midnights from the point of admission due to high intensity of service, high risk for further deterioration and high frequency of surveillance required.*   Arnulfo Larch, MD [facility]  How to contact the [facility] Attending or Consulting provider 7A - 7P or covering provider during after hours 7P -7A, for this patient.  Check the care team in [facility] and look for a) attending/consulting [facility] provider listed and b) the [facility] team listed Log into [email or web address] and use [place]'s universal password to access. If you do not have the password, please contact the hospital operator. Locate the [facility] provider you are looking for under [facility] and page to a number that you can be directly reached. If you still have difficulty  reaching the provider, please page the [facility] (Director on Call) for the Hospitalists listed on amion for assistance.  04/11/2024, 2:13 AM

## 2024-04-11 NOTE — Progress Notes (Signed)
   04/11/24 1534  TOC Brief Assessment  Insurance and Status Reviewed  Patient has primary care physician Yes  Home environment has been reviewed Single family home  Prior level of function: Independent  Prior/Current Home Services No current home services  Social Drivers of Health Review SDOH reviewed no interventions necessary  Readmission risk has been reviewed Yes  Transition of care needs transition of care needs identified, TOC will continue to follow   TOC consulted for substance use resources. Outpatient and residential resources have been placed on AVS.

## 2024-04-12 ENCOUNTER — Inpatient Hospital Stay (HOSPITAL_COMMUNITY): Payer: Self-pay | Admitting: Anesthesiology

## 2024-04-12 ENCOUNTER — Encounter (HOSPITAL_COMMUNITY)
Admission: EM | Disposition: A | Discharge status: Home / Self Care | Payer: Self-pay | Source: Home / Self Care | Attending: Internal Medicine

## 2024-04-12 ENCOUNTER — Encounter (HOSPITAL_COMMUNITY): Payer: Self-pay | Admitting: Internal Medicine

## 2024-04-12 DIAGNOSIS — K2210 Ulcer of esophagus without bleeding: Secondary | ICD-10-CM

## 2024-04-12 DIAGNOSIS — K449 Diaphragmatic hernia without obstruction or gangrene: Secondary | ICD-10-CM | POA: Diagnosis not present

## 2024-04-12 DIAGNOSIS — K209 Esophagitis, unspecified without bleeding: Secondary | ICD-10-CM | POA: Insufficient documentation

## 2024-04-12 DIAGNOSIS — K922 Gastrointestinal hemorrhage, unspecified: Secondary | ICD-10-CM | POA: Diagnosis not present

## 2024-04-12 DIAGNOSIS — I10 Essential (primary) hypertension: Secondary | ICD-10-CM

## 2024-04-12 DIAGNOSIS — I480 Paroxysmal atrial fibrillation: Secondary | ICD-10-CM

## 2024-04-12 HISTORY — PX: ESOPHAGOGASTRODUODENOSCOPY: SHX5428

## 2024-04-12 LAB — CBC
HCT: 38.2 % — ABNORMAL LOW (ref 39.0–52.0)
Hemoglobin: 13 g/dL (ref 13.0–17.0)
MCH: 29.7 pg (ref 26.0–34.0)
MCHC: 34 g/dL (ref 30.0–36.0)
MCV: 87.2 fL (ref 80.0–100.0)
Platelets: 115 10*3/uL — ABNORMAL LOW (ref 150–400)
RBC: 4.38 MIL/uL (ref 4.22–5.81)
RDW: 15.8 % — ABNORMAL HIGH (ref 11.5–15.5)
WBC: 8.3 10*3/uL (ref 4.0–10.5)
nRBC: 0 % (ref 0.0–0.2)

## 2024-04-12 LAB — COMPREHENSIVE METABOLIC PANEL WITH GFR
ALT: 19 U/L (ref 0–44)
AST: 40 U/L (ref 15–41)
Albumin: 3.9 g/dL (ref 3.5–5.0)
Alkaline Phosphatase: 64 U/L (ref 38–126)
Anion gap: 11 (ref 5–15)
BUN: 11 mg/dL (ref 6–20)
CO2: 27 mmol/L (ref 22–32)
Calcium: 8.1 mg/dL — ABNORMAL LOW (ref 8.9–10.3)
Chloride: 93 mmol/L — ABNORMAL LOW (ref 98–111)
Creatinine, Ser: 0.83 mg/dL (ref 0.61–1.24)
GFR, Estimated: >60 mL/min (ref >60)
Glucose, Bld: 140 mg/dL — ABNORMAL HIGH (ref 70–99)
Potassium: 3.6 mmol/L (ref 3.5–5.1)
Sodium: 131 mmol/L — ABNORMAL LOW (ref 135–145)
Total Bilirubin: 1.2 mg/dL (ref 0.0–1.2)
Total Protein: 8.1 g/dL (ref 6.5–8.1)

## 2024-04-12 LAB — TYPE AND SCREEN
ABO/RH(D): O POS
Antibody Screen: NEGATIVE

## 2024-04-12 SURGERY — EGD (ESOPHAGOGASTRODUODENOSCOPY)
Anesthesia: Monitor Anesthesia Care

## 2024-04-12 MED ORDER — PROPOFOL 500 MG/50ML IV EMUL
INTRAVENOUS | Status: DC | PRN
  Administered 2024-04-12: 175 ug/kg/min via INTRAVENOUS

## 2024-04-12 MED ORDER — DEXMEDETOMIDINE HCL IN NACL 80 MCG/20ML IV SOLN
INTRAVENOUS | Status: DC | PRN
  Administered 2024-04-12: 8 ug via INTRAVENOUS

## 2024-04-12 MED ORDER — PROPOFOL 10 MG/ML IV BOLUS
INTRAVENOUS | Status: DC | PRN
Start: 2024-04-12 — End: 2024-04-12
  Administered 2024-04-12: 50 mg via INTRAVENOUS
  Administered 2024-04-12 (×2): 30 mg via INTRAVENOUS
  Administered 2024-04-12: 40 mg via INTRAVENOUS

## 2024-04-12 MED ORDER — METHYLPREDNISOLONE SODIUM SUCC 40 MG IJ SOLR
20.0000 mg | Freq: Once | INTRAMUSCULAR | Status: AC
  Administered 2024-04-12: 20 mg via INTRAVENOUS
  Filled 2024-04-12: qty 1

## 2024-04-12 MED ORDER — LIDOCAINE 2% (20 MG/ML) 5 ML SYRINGE
INTRAMUSCULAR | Status: DC | PRN
  Administered 2024-04-12: 60 mg via INTRAVENOUS

## 2024-04-12 MED ORDER — SODIUM CHLORIDE 0.9 % IV SOLN: INTRAVENOUS | Status: DC

## 2024-04-12 MED ORDER — PROPOFOL 500 MG/50ML IV EMUL
INTRAVENOUS | Status: AC
  Filled 2024-04-12: qty 50

## 2024-04-12 NOTE — Plan of Care (Signed)

## 2024-04-12 NOTE — Anesthesia Postprocedure Evaluation (Signed)
 Anesthesia Post Note  Patient: Richard Lopez  Procedure(s) Performed: EGD (ESOPHAGOGASTRODUODENOSCOPY)     Patient location during evaluation: Endoscopy Anesthesia Type: MAC Level of consciousness: awake and alert Pain management: pain level controlled Vital Signs Assessment: post-procedure vital signs reviewed and stable Respiratory status: spontaneous breathing, nonlabored ventilation, respiratory function stable and patient connected to nasal cannula oxygen Cardiovascular status: blood pressure returned to baseline and stable Postop Assessment: no apparent nausea or vomiting Anesthetic complications: no  No notable events documented.  Last Vitals:  Vitals:   04/12/24 1145 04/12/24 1207  BP: 129/78 (!) 153/81  Pulse: 99 100  Resp: (!) 29 16  Temp:  36.8 C  SpO2: 96% 99%    Last Pain:  Vitals:   04/12/24 1145  TempSrc:   PainSc: 0-No pain                 Rosalita Combe

## 2024-04-12 NOTE — Anesthesia Procedure Notes (Signed)
 Procedure Name: MAC Date/Time: 04/12/2024 11:15 AM  Performed by: Ulanda Gambles, CRNAPre-anesthesia Checklist: Emergency Drugs available, Patient identified, Suction available and Patient being monitored Oxygen Delivery Method: Simple face mask

## 2024-04-12 NOTE — Interval H&P Note (Signed)
 History and Physical Interval Note: 47/male with hematemesis and coffee-ground emesis for EGD with propofol .   04/12/2024 11:02 AM  Richard Lopez  has presented today for EGD with propofol  with the diagnosis of hematemesis, coffee ground emesis.  The various methods of treatment have been discussed with the patient and family. After consideration of risks, benefits and other options for treatment, the patient has consented to  Procedure(s): EGD (ESOPHAGOGASTRODUODENOSCOPY) (N/A) as a surgical intervention.  The patient's history has been reviewed, patient examined, no change in status, stable for surgery.  I have reviewed the patient's chart and labs.  Questions were answered to the patient's satisfaction.     Genell Ken

## 2024-04-12 NOTE — Progress Notes (Signed)
 PROGRESS NOTE Richard Lopez  QIO:[id number] DOB: 11-Jan-1976 DOA: 04/10/2024 PCP: Marius Siemens, NP  Brief Narrative/Hospital Course: 48 y.o. male with hx of gastritis and esophagitis, esophageal ulcers, suspected cannabis hyperemesis, with continued THC use, alcohol abuse, gallstones, hypertension, prediabetes, fatty liver, paroxysmal Afib, history of PE completed anticoagulation, mood disorder, recent admission from 5/14-16 with nausea and vomiting felt to be related to cannabis hyperemesis presented with acute onset of abdominal pain, nausea, vomiting with multiple episodes of vomiting, some with mixed bright red blood. In the ED: Tachycardic BP stable afebrile  labs: lactate 2.8 -> 1.8 VBG 7.58/41 Bicarb 31, AG 20 Creatinine 2.2 baseline ~0.6-0.7  T. bili 1.5 not fractionated, AST 55, lipase within normal limits Patient  Subjective: Patient seen and examined Has been needing prn iv  Zofran  and Phenergan  for his nausea Overnight patient is afebrile BP 130s to 160s, on room air  Labs reviewed stable hyponatremia 131 renal function potassium LFTs normal CBC with thrombocytopenia  Assessment and plan:  Intractable nausea vomiting Recurrent admission for similar issues Cannabis hyperemesis syndrome Upper GI bleeding Severe esophagitis/esophageal ulcers gastritis on last EGD and negative negative for H. Pylori:  has significant dyspepsia and reflux symptoms.  Few episodes of emesis w/ mixed bright red blood. GI following closely-plan for EGD 6/10, continue IVF PPI Carafate  pain management and antiemetics  rend hemoglobin.  Encouraged marijuana cessation  Fatty liver/ mild alcoholic hepatitis Cholelithiasis without gallbladder thickening: RUQ shows cholelithiasis without gallbladder wall thickening or pericholecystic fluid.lfts improved. Continue alcohol cessation  AKI stage IIIa w/ peak to 2.2 Metabolic alkalosis, likely secondary to contraction and gastric fluid loss B/l creat  ~0.6-.7.Suspect prerenal in the setting of GI losses.  It has resolved.  Encourage oral intake Recent Labs    12/15/23 0455 12/16/23 0433 03/16/24 0600 03/16/24 1635 03/17/24 0025 03/17/24 0544 03/18/24 0514 04/10/24 2207 04/11/24 0520 04/12/24 0531  BUN 19 13 16 14 12 9 8  22* 19 11  CREATININE 1.85* 0.98 1.09 0.81 0.74 0.59* 0.76 2.23* 1.57* 0.83  CO2 23 22 29 28 26 25 25 31 26  27  K 3.4* 3.4* 2.7* 3.0* 3.0* 3.2* 3.2* 3.5 3.8 3.6     Alcohol use disorder THC use disorder: denies heavy alcohol use has a history of severe alcohol withdrawal requiring ICU admission in the past. Cont ciwa, thiamine  folate MV Counseled on THC cessation   Hypokalemia Resolved  Hypertension: No recent fill of amlodipine -holding  Prediabetes: Last A1c 5.7.  Diet controlled  Hx paroxysmal Afib: NSR. not on [facility] outpatient.   History of PE/DVT: in 10/'20, setting of hip fracture. completed anticoagulation  Mood disorder: Not on medication   Gouty arthritis with pain: Concern for acute flareup, unable to take colchicine  in the past due to vomiting.  Steroids not a great idea but can consider for few dosese.  DVT prophylaxis: SCDs Start: 04/11/24 0118 Code Status:   Code Status: Full Code Family Communication: plan of care discussed with patient at bedside. Patient status is: Remains hospitalized because of severity of illness Level of care: Telemetry   Dispo: The patient is from: home            Anticipated disposition: TBD Objective: Vitals last 24 hrs: Vitals:   04/11/24 1526 04/11/24 1945 04/11/24 2329 04/12/24 0410  BP: (!) 141/88 134/80 (!) 159/91 (!) 165/96  Pulse: 86  86 81  Resp:    18  Temp: 98.3 F (36.8 C) 99.2 F (37.3 C) 98.8 F (37.1 C) 98.4  F (36.9 C)  TempSrc: Oral Tympanic Oral   SpO2: 91% 97% 97% 100%    Physical Examination: General exam: alert awake, oriented at baseline, older than stated age HEENT:Oral mucosa moist, Ear/Nose WNL grossly Respiratory  system: Bilaterally clear BS,no use of accessory muscle Cardiovascular system: S1 & S2 +, No JVD. Gastrointestinal system: Abdomen soft, tender mid and epigastric area, ND, BS+ Nervous System: Alert, awake, moving all extremities,and following commands. Extremities: LE edema neg,distal peripheral pulses palpable and warm.  Skin: No rashes,no icterus. MSK: Normal muscle bulk,tone, power  Data Reviewed: I have personally reviewed following labs and imaging studies ( see epic result tab) CBC: Recent Labs  Lab 04/10/24 2207 04/11/24 0520 04/12/24 0531  WBC 7.3 7.2 8.3  HGB 12.8* 11.6* 13.0  HCT 36.9* 34.2* 38.2*  MCV 85.6 88.4 87.2  PLT 182 128* 115*   CMP: Recent Labs  Lab 04/10/24 2207 04/11/24 0520 04/12/24 0531  NA 134* 132* 131*  K 3.5 3.8 3.6  CL 83* 91* 93*  CO2 31 26 27   GLUCOSE 194* 132* 140*  BUN 22* 19 11  CREATININE 2.23* 1.57* 0.83  CALCIUM  9.6 8.2* 8.1*  MG  --  1.8  --   PHOS  --  2.8  --    GFR: CrCl cannot be calculated (Unknown ideal weight.). Recent Labs  Lab 04/10/24 2207 04/11/24 0520 04/12/24 0531  AST 55* 45* 40  ALT 28 21 19   ALKPHOS 78 62 64  BILITOT 1.5* 1.2 1.2  PROT 9.6* 7.8 8.1  ALBUMIN  5.0 3.9 3.9    Recent Labs  Lab 04/10/24 2207  LIPASE 37   No results for input(s): "AMMONIA" in the last 168 hours. Coagulation Profile:  Recent Labs  Lab 04/11/24 0520  INR 1.1   Unresulted Labs (From admission, onward)     Start     Ordered   04/12/24 0500  CBC  Daily,   R      04/11/24 1208   04/12/24 0500  Comprehensive metabolic panel with GFR  Daily,   R      04/11/24 1208           Antimicrobials/Microbiology: Anti-infectives (From admission, onward)    None         Component Value Date/Time   SDES  08/24/2023 1647    URINE, RANDOM Performed at [facility], [address], [place], [place] [address]    SPECREQUEST  08/24/2023 1647    NONE Reflexed from [id number] Performed at [facility], [address], [place], [place] [address]    CULT (A) 08/24/2023 1647    <10,000 COLONIES/mL INSIGNIFICANT GROWTH Performed at [facility], [address], [place], [place] [address]    REPTSTATUS 08/25/2023 FINAL 08/24/2023 1647    Procedures: Procedure(s) (LRB): EGD (ESOPHAGOGASTRODUODENOSCOPY) (N/A) Medications reviewed:  Scheduled Meds:  fluticasone   2 spray Each Nare Daily   folic acid   1 mg Oral Daily   multivitamin with minerals  1 tablet Oral Daily   pantoprazole  (PROTONIX ) IV  40 mg Intravenous BID   sodium chloride  flush  3 mL Intravenous Q12H   sucralfate   1 g Oral TID WC & HS   thiamine   100 mg Oral Daily   Or   thiamine   100 mg Intravenous Daily   Continuous Infusions:  sodium chloride  20 mL/hr at 04/12/24 0828   sodium chloride  100 mL/hr at 04/11/24 2123   promethazine  (PHENERGAN ) injection (IM or IVPB) 12.5 mg (  04/12/24 0636)    Lesa Rape, MD [facility] 04/12/2024, 9:48 AM

## 2024-04-12 NOTE — Progress Notes (Signed)
 Patient actively sweating profusely, offered ativan  prn for withdrawal symptoms. Patient refused offer of ativan  due to complaints of hallucinations. Given prn atarax  instead for anxiety.

## 2024-04-12 NOTE — Op Note (Signed)
 [facility] Patient Name: Richard Lopez Procedure Date: 04/12/2024 MRN: [id number] Attending MD: Genell Ken , MD, [id number] Date of Birth: 07/17/1976 CSN: [id number] Age: 48 Admit Type: Inpatient Procedure:                Upper GI endoscopy Indications:              Epigastric abdominal pain, Coffee-ground emesis,                            Hematemesis, Nausea with vomiting Providers:                Genell Ken, MD, Golda Latch, RN Referring MD:             [facility] Medicines:                Monitored Anesthesia Care Complications:            No immediate complications. Estimated blood loss:                            Minimal. Estimated Blood Loss:     Estimated blood loss was minimal. Procedure:                Pre-Anesthesia Assessment:                           - Prior to the procedure, a History and Physical                            was performed, and patient medications and                            allergies were reviewed. The patient's tolerance of                            previous anesthesia was also reviewed. The risks                            and benefits of the procedure and the sedation                            options and risks were discussed with the patient.                            All questions were answered, and informed consent                            was obtained. Prior Anticoagulants: The patient has                            taken no anticoagulant or antiplatelet agents. ASA                            Grade Assessment: III - A patient with severe  systemic disease. After reviewing the risks and                            benefits, the patient was deemed in satisfactory                            condition to undergo the procedure.                           After obtaining informed consent, the endoscope was                            passed under direct vision. Throughout the                             procedure, the patient's blood pressure, pulse, and                            oxygen saturations were monitored continuously. The                            [id number]-H190 ( [id number] ) Olympus Endoscope was                            introduced through the mouth, and advanced to the                            second part of duodenum. The upper GI endoscopy was                            accomplished without difficulty. The patient                            tolerated the procedure well. Scope In: Scope Out: Findings:      LA Grade D (one or more mucosal breaks involving at least 75% of       esophageal circumference) esophagitis with no bleeding was found 20 to       40 cm from the incisors. Biopsies were taken with a cold forceps for       histology.      Few superficial esophageal ulcers were found 20 to 40 cm from the       incisors.      A 3 cm hiatal hernia was present.      Localized severely erythematous mucosa without bleeding was found in the       cardia ,likely related to retching.      Patchy mildly erythematous mucosa without bleeding was found in the       gastric body and in the gastric antrum. Biopsies were taken with a cold       forceps for Helicobacter pylori testing.      The examined duodenum was normal. Biopsies for histology were taken with       a cold forceps for evaluation of celiac disease. Impression:               - LA Grade D reflux esophagitis with no  bleeding.                            Biopsied.                           - Esophageal ulcers.                           - 3 cm hiatal hernia.                           - Erythematous mucosa in the cardia.                           - Erythematous mucosa in the gastric body and                            antrum. Biopsied.                           - Normal examined duodenum. Biopsied. Moderate Sedation:      Patient did not receive moderate sedation for this procedure, but       instead received monitored  anesthesia care. Recommendation:           - Mechanical soft diet.                           - Continue present medications.                           - Recommend PPI twice a day for 2 months and                            sucralfate  4 times a day for 1 month.                           - Recommend avoiding marijuana and alcohol use. Procedure Code(s):        --- Professional ---                           [phone], Esophagogastroduodenoscopy, flexible,                            transoral; with biopsy, single or multiple Diagnosis Code(s):        --- Professional ---                           K21.00, Gastro-esophageal reflux disease with                            esophagitis, without bleeding                           K22.10, Ulcer of esophagus without bleeding                           K44.9, Diaphragmatic  hernia without obstruction or                            gangrene                           K31.89, Other diseases of stomach and duodenum                           R10.13, Epigastric pain                           K92.0, Hematemesis                           R11.2, Nausea with vomiting, unspecified CPT copyright 2022 [employer]. All rights reserved. The codes documented in this report are preliminary and upon coder review may  be revised to meet current compliance requirements. Genell Ken, MD 04/12/2024 11:39:55 AM This report has been signed electronically. Number of Addenda: 0

## 2024-04-12 NOTE — Plan of Care (Signed)
   Problem: Activity: Goal: Risk for activity intolerance will decrease Outcome: Progressing   Problem: Nutrition: Goal: Adequate nutrition will be maintained Outcome: Progressing   Problem: Pain Managment: Goal: General experience of comfort will improve and/or be controlled Outcome: Progressing

## 2024-04-12 NOTE — Transfer of Care (Signed)
 Immediate Anesthesia Transfer of Care Note  Patient: ZACARIAS KRAUTER  Procedure(s) Performed: EGD (ESOPHAGOGASTRODUODENOSCOPY)  Patient Location: PACU  Anesthesia Type:MAC  Level of Consciousness: awake, alert , and oriented  Airway & Oxygen Therapy: Patient Spontanous Breathing and Patient connected to face mask oxygen  Post-op Assessment: Report given to RN, Post -op Vital signs reviewed and stable, and Patient moving all extremities X 4  Post vital signs: Reviewed and stable  Last Vitals:  Vitals Value Taken Time  BP 135/55   Temp    Pulse 125   Resp 18   SpO2 100     Last Pain:  Vitals:   04/12/24 1009  TempSrc: Temporal  PainSc: 8       Patients Stated Pain Goal: 3 (04/12/24 1009)  Complications: No notable events documented.

## 2024-04-13 ENCOUNTER — Other Ambulatory Visit (HOSPITAL_COMMUNITY): Payer: Self-pay

## 2024-04-13 DIAGNOSIS — N179 Acute kidney failure, unspecified: Secondary | ICD-10-CM | POA: Diagnosis not present

## 2024-04-13 LAB — COMPREHENSIVE METABOLIC PANEL WITH GFR
ALT: 18 U/L (ref 0–44)
AST: 31 U/L (ref 15–41)
Albumin: 3.7 g/dL (ref 3.5–5.0)
Alkaline Phosphatase: 61 U/L (ref 38–126)
Anion gap: 12 (ref 5–15)
BUN: 7 mg/dL (ref 6–20)
CO2: 27 mmol/L (ref 22–32)
Calcium: 8.6 mg/dL — ABNORMAL LOW (ref 8.9–10.3)
Chloride: 94 mmol/L — ABNORMAL LOW (ref 98–111)
Creatinine, Ser: 0.69 mg/dL (ref 0.61–1.24)
GFR, Estimated: >60 mL/min (ref >60)
Glucose, Bld: 155 mg/dL — ABNORMAL HIGH (ref 70–99)
Potassium: 3.2 mmol/L — ABNORMAL LOW (ref 3.5–5.1)
Sodium: 133 mmol/L — ABNORMAL LOW (ref 135–145)
Total Bilirubin: 1.4 mg/dL — ABNORMAL HIGH (ref 0.0–1.2)
Total Protein: 7.8 g/dL (ref 6.5–8.1)

## 2024-04-13 LAB — CBC
HCT: 38.8 % — ABNORMAL LOW (ref 39.0–52.0)
Hemoglobin: 13 g/dL (ref 13.0–17.0)
MCH: 30.4 pg (ref 26.0–34.0)
MCHC: 33.5 g/dL (ref 30.0–36.0)
MCV: 90.9 fL (ref 80.0–100.0)
Platelets: 107 10*3/uL — ABNORMAL LOW (ref 150–400)
RBC: 4.27 MIL/uL (ref 4.22–5.81)
RDW: 15.7 % — ABNORMAL HIGH (ref 11.5–15.5)
WBC: 7.8 10*3/uL (ref 4.0–10.5)
nRBC: 0 % (ref 0.0–0.2)

## 2024-04-13 MED ORDER — POTASSIUM CHLORIDE 10 MEQ/100ML IV SOLN
10.0000 meq | INTRAVENOUS | Status: AC
  Administered 2024-04-13 (×2): 10 meq via INTRAVENOUS
  Filled 2024-04-13 (×2): qty 100

## 2024-04-13 MED ORDER — SUCRALFATE 1 GM/10ML PO SUSP
1.0000 g | Freq: Three times a day (TID) | ORAL | 0 refills | Status: DC
  Filled 2024-04-13: qty 473, 12d supply, fill #0

## 2024-04-13 MED ORDER — PANTOPRAZOLE SODIUM 40 MG PO TBEC
80.0000 mg | DELAYED_RELEASE_TABLET | Freq: Every day | ORAL | 1 refills | Status: DC
  Filled 2024-04-13: qty 60, 30d supply, fill #0

## 2024-04-13 MED ORDER — METHYLPREDNISOLONE SODIUM SUCC 40 MG IJ SOLR
20.0000 mg | Freq: Once | INTRAMUSCULAR | Status: AC
  Administered 2024-04-13: 20 mg via INTRAVENOUS
  Filled 2024-04-13: qty 1

## 2024-04-13 NOTE — Discharge Summary (Signed)
 Physician Discharge Summary  Richard Lopez:[id number] DOB: 1976-02-11 DOA: 04/10/2024  PCP: Marius Siemens, NP  Admit date: 04/10/2024 Discharge date: 04/13/2024 Recommendations for Outpatient Follow-up:  Follow up with PCP in 1 weeks, GI in 2-3 wks -call for appointment Please obtain BMP/CBC in one week   Discharge Dispo: Home Discharge Condition: Stable Code Status:   Code Status: Full Code Diet recommendation:  Diet Order             DIET DYS 3 Room service appropriate? Yes; Fluid consistency: Thin  Diet effective now                    Brief/Interim Summary: 48 y.o. male with hx of gastritis and esophagitis, esophageal ulcers, suspected cannabis hyperemesis, with continued THC use, alcohol abuse, gallstones, hypertension, prediabetes, fatty liver, paroxysmal Afib, history of PE completed anticoagulation, mood disorder, recent admission from 5/14-16 with nausea and vomiting felt to be related to cannabis hyperemesis presented with acute onset of abdominal pain, nausea, vomiting with multiple episodes of vomiting, some with mixed bright red blood. In the ED: Tachycardic BP stable afebrile  labs: lactate 2.8 -> 1.8 VBG 7.58/41 Bicarb 31, AG 20 Creatinine 2.2 baseline ~0.6-0.7  T. bili 1.5 not fractionated, AST 55, lipase within normal limits. Patient was admitted manage IV fluids antiemetics pain control, seen by GI Underwent EGD see finding below diet is being advanced Tolerating po altho some burning while eating. Agreeable for dc home today  Subjective: Seen examined Tolerating po altho some burning while eating Wants one more inj of solumedrol before dc for his gout Overnight events afebrile BP stable on room air Labs reviewed mild hypokalemia otherwise stable with chronic thrombocytopenia  Discharge Diagnoses Intractable nausea vomiting Recurrent admission for similar issues Cannabis hyperemesis syndrome Severe esophagitis/esophageal ulcers gastritis on last EGD  and negative negative for H. Pylori:  has significant dyspepsia and reflux symptoms.  Few episodes of emesis w/ mixed bright red blood. GI following closely-treated with IVF PPI Carafate  pain management and antiemetics - HB Stable EGD 6/10>EGD showed severe esophagitis, esophageal ulcers, severe erythema in gastric cardia likely from retching. Gi Have taken biopsies from esophagus to rule out HSV/CMV, biopsies from stomach to rule out H. pylori, biopsies from small bowel to rule out celiac. He will need to be compliant with PPI twice a day for 2 months and sucralfate  4 times a day for 1 month. Biopsies can be followed as outpatient. I have advised him to stop marijuana use and alcohol use.  Tolerating diet and okay for discharge today  Fatty liver/ mild alcoholic hepatitis Cholelithiasis without gallbladder thickening: RUQ shows cholelithiasis without gallbladder wall thickening or pericholecystic fluid.lfts improved. Continue alcohol cessation  AKI stage IIIa w/ peak to 2.2 Metabolic alkalosis, likely secondary to contraction and gastric fluid loss B/l creat ~0.6-.7.Suspect prerenal in the setting of GI losses.  It has resolved.  Encourage oral intake Recent Labs    12/16/23 0433 03/16/24 0600 03/16/24 1635 03/17/24 0025 03/17/24 0544 03/18/24 0514 04/10/24 2207 04/11/24 0520 04/12/24 0531 04/13/24 0545  BUN 13 16 14 12 9 8  22* 19 11 7   CREATININE 0.98 1.09 0.81 0.74 0.59* 0.76 2.23* 1.57* 0.83 0.69  CO2 22 29 28 26 25 25 31 26 27  27  K 3.4* 2.7* 3.0* 3.0* 3.2* 3.2* 3.5 3.8 3.6 3.2*     Alcohol use disorder THC use disorder: denies heavy alcohol use has a history of severe alcohol withdrawal requiring ICU admission in  the past. Cont ciwa, thiamine  folate MV Counseled on THC cessation   Hypokalemia Replaced.  Gout with tophi: Given Solu-Medrol  with improvement, will redose with Solu-Medrol  x 1 for discharge discussed about minimizing NSAIDs as well as steroid in the setting of  his severe esophagitis and risk of ulceration and perforation  Hypertension: No recent fill of amlodipine -holding  Prediabetes: Last A1c 5.7.  Diet controlled  Hx paroxysmal Afib: NSR. not on [facility] outpatient.   History of PE/DVT: in 10/'20, setting of hip fracture. completed anticoagulation  Mood disorder: Not on medication   Consults: Gastroenterology Procedure(s) (LRB): EGD (ESOPHAGOGASTRODUODENOSCOPY) (N/A)   Discharge Exam: Vitals:   04/12/24 1944 04/13/24 0409  BP: (!) 153/94 (!) 154/92  Pulse: 90 75  Resp: 19 16  Temp: 98.9 F (37.2 C) (!) 97.4 F (36.3 C)  SpO2: 99% 99%   General: Pt is alert, awake, not in acute distress Cardiovascular: RRR, S1/S2 +, no rubs, no gallops Respiratory: CTA bilaterally, no wheezing, no rhonchi Abdominal: Soft, NT, ND, bowel sounds + Extremities: no edema, no cyanosis  Discharge Instructions  Discharge Instructions     Discharge instructions   Complete by: As directed    Please call call MD or return to ER for similar or worsening recurring problem that brought you to hospital or if any fever,nausea/vomiting,abdominal pain, uncontrolled pain, chest pain,  shortness of breath or any other alarming symptoms.  Please follow-up your doctor as instructed in a week time and call the office for appointment.  Please avoid alcohol, smoking, or any other illicit substance and maintain healthy habits including taking your regular medications as prescribed.  You were cared for by a hospitalist during your hospital stay. If you have any questions about your discharge medications or the care you received while you were in the hospital after you are discharged, you can call the unit and ask to speak with the hospitalist on call if the hospitalist that took care of you is not available.  Once you are discharged, your primary care physician will handle any further medical issues. Please note that NO REFILLS for any discharge medications will be  authorized once you are discharged, as it is imperative that you return to your primary care physician (or establish a relationship with a primary care physician if you do not have one) for your aftercare needs so that they can reassess your need for medications and monitor your lab values   Increase activity slowly   Complete by: As directed       Allergies as of 04/13/2024       Reactions   Ativan  [lorazepam ] Anxiety, Other (See Comments)   Hallucinations   Nsaids Other (See Comments)   Hx of esophageal ulcer        Medication List     TAKE these medications    amLODipine  10 MG tablet Commonly known as: NORVASC  Take 1 tablet (10 mg total) by mouth daily. What changed: Another medication with the same name was removed. Continue taking this medication, and follow the directions you see here.   multivitamin with minerals Tabs tablet Take 1 tablet by mouth daily.   ondansetron  4 MG tablet Commonly known as: Zofran  Take 1 tablet (4 mg total) by mouth every 8 (eight) hours as needed for nausea or vomiting.   pantoprazole  40 MG tablet Commonly known as: Protonix  Take 2 tablets (80 mg total) by mouth daily.   Potassium 99 MG Tabs Take 1 tablet by mouth daily.   sucralfate   1 GM/10ML suspension Commonly known as: CARAFATE  Take 10 mLs (1 g total) by mouth 4 (four) times daily -  with meals and at bedtime.        Follow-up Information     Marius Siemens, NP Follow up in 1 week(s).   Specialty: Internal Medicine Contact information: [address] [place] [address] [phone]         Genell Ken, MD Follow up in 1 week(s).   Specialty: Gastroenterology Contact information: [address] [place] [address] [phone]                Allergies  Allergen Reactions   Ativan  [Lorazepam ] Anxiety and Other (See Comments)    Hallucinations   Nsaids Other (See Comments)    Hx of esophageal ulcer    The results of  significant diagnostics from this hospitalization (including imaging, microbiology, ancillary and laboratory) are listed below for reference.    Microbiology: Recent Results (from the past 240 hours)  Resp panel by RT-PCR (RSV, Flu A&B, Covid) Anterior Nasal Swab     Status: None   Collection Time: 04/11/24  4:44 AM   Specimen: Anterior Nasal Swab  Result Value Ref Range Status   SARS Coronavirus 2 by RT PCR NEGATIVE NEGATIVE Final    Comment: (NOTE) SARS-CoV-2 target nucleic acids are NOT DETECTED.  The SARS-CoV-2 RNA is generally detectable in upper respiratory specimens during the acute phase of infection. The lowest concentration of SARS-CoV-2 viral copies this assay can detect is 138 copies/mL. A negative result does not preclude SARS-Cov-2 infection and should not be used as the sole basis for treatment or other patient management decisions. A negative result may occur with  improper specimen collection/handling, submission of specimen other than nasopharyngeal swab, presence of viral mutation(s) within the areas targeted by this assay, and inadequate number of viral copies(<138 copies/mL). A negative result must be combined with clinical observations, patient history, and epidemiological information. The expected result is Negative.  Fact Sheet for Patients:  BloggerCourse.com  Fact Sheet for Healthcare Providers:  SeriousBroker.it  This test is no t yet approved or cleared by the [place]  FDA and  has been authorized for detection and/or diagnosis of SARS-CoV-2 by FDA under an Emergency Use Authorization (EUA). This EUA will remain  in effect (meaning this test can be used) for the duration of the COVID-19 declaration under Section 564(b)(1) of the Act, 21 U.S.C.section 360bbb-3(b)(1), unless the authorization is terminated  or revoked sooner.       Influenza A by PCR NEGATIVE NEGATIVE Final   Influenza B by PCR  NEGATIVE NEGATIVE Final    Comment: (NOTE) The Xpert Xpress SARS-CoV-2/FLU/RSV plus assay is intended as an aid in the diagnosis of influenza from Nasopharyngeal swab specimens and should not be used as a sole basis for treatment. Nasal washings and aspirates are unacceptable for Xpert Xpress SARS-CoV-2/FLU/RSV testing.  Fact Sheet for Patients: BloggerCourse.com  Fact Sheet for Healthcare Providers: SeriousBroker.it  This test is not yet approved or cleared by the [place]  FDA and has been authorized for detection and/or diagnosis of SARS-CoV-2 by FDA under an Emergency Use Authorization (EUA). This EUA will remain in effect (meaning this test can be used) for the duration of the COVID-19 declaration under Section 564(b)(1) of the Act, 21 U.S.C. section 360bbb-3(b)(1), unless the authorization is terminated or revoked.     Resp Syncytial Virus by PCR NEGATIVE NEGATIVE Final    Comment: (NOTE) Fact Sheet  for Patients: BloggerCourse.com  Fact Sheet for Healthcare Providers: SeriousBroker.it  This test is not yet approved or cleared by the [place]  FDA and has been authorized for detection and/or diagnosis of SARS-CoV-2 by FDA under an Emergency Use Authorization (EUA). This EUA will remain in effect (meaning this test can be used) for the duration of the COVID-19 declaration under Section 564(b)(1) of the Act, 21 U.S.C. section 360bbb-3(b)(1), unless the authorization is terminated or revoked.  Performed at [facility], [address] [place], [place] [address]     Procedures/Studies: US  Abdomen Limited RUQ (LIVER/GB) Result Date: 04/11/2024 CLINICAL DATA:  Right upper quadrant pain.  History of gallstones. EXAM: ULTRASOUND ABDOMEN LIMITED RIGHT UPPER QUADRANT COMPARISON:  CT scan 08/24/2023 FINDINGS: Gallbladder: Multiple dependent gallstones  evident measuring up to 11 mm. No gallbladder wall thickening or pericholecystic fluid. Sonographer reports no sonographic Murphy sign. Common bile duct: Diameter: 4-5 mm. Liver: No focal lesion identified. Liver parenchyma shows increased echogenicity. Portal vein is patent on color Doppler imaging with normal direction of blood flow towards the liver. Other: None. IMPRESSION: 1. Cholelithiasis without gallbladder wall thickening or pericholecystic fluid. 2. Increased hepatic parenchymal echogenicity suggests fatty infiltration. Electronically Signed   By: Donnal Fusi M.D.   On: 04/11/2024 07:21   DG Abd 2 Views Result Date: 03/16/2024 CLINICAL DATA:  Vomiting. EXAM: ABDOMEN - 2 VIEW COMPARISON:  October 22, 2020. FINDINGS: The bowel gas pattern is normal. There is no evidence of free air. No radio-opaque calculi or other significant radiographic abnormality is seen. IMPRESSION: Negative. Electronically Signed   By: Rosalene Colon M.D.   On: 03/16/2024 16:06    Labs: BNP (last 3 results) No results for input(s): BNP in the last 8760 hours. Basic Metabolic Panel: Recent Labs  Lab 04/10/24 2207 04/11/24 0520 04/12/24 0531 04/13/24 0545  NA 134* 132* 131* 133*  K 3.5 3.8 3.6 3.2*  CL 83* 91* 93* 94*  CO2 31 26 27 27   GLUCOSE 194* 132* 140* 155*  BUN 22* 19 11 7   CREATININE 2.23* 1.57* 0.83 0.69  CALCIUM  9.6 8.2* 8.1* 8.6*  MG  --  1.8  --   --   PHOS  --  2.8  --   --    Liver Function Tests: Recent Labs  Lab 04/10/24 2207 04/11/24 0520 04/12/24 0531 04/13/24 0545  AST 55* 45* 40 31  ALT 28 21 19 18   ALKPHOS 78 62 64 61  BILITOT 1.5* 1.2 1.2 1.4*  PROT 9.6* 7.8 8.1 7.8  ALBUMIN  5.0 3.9 3.9 3.7   Recent Labs  Lab 04/10/24 2207  LIPASE 37   No results for input(s): AMMONIA in the last 168 hours. CBC: Recent Labs  Lab 04/10/24 2207 04/11/24 0520 04/12/24 0531 04/13/24 0545  WBC 7.3 7.2 8.3 7.8  HGB 12.8* 11.6* 13.0 13.0  HCT 36.9* 34.2* 38.2* 38.8*  MCV  85.6 88.4 87.2 90.9  PLT 182 128* 115* 107*   Cardiac Enzymes: No results for input(s): CKTOTAL, CKMB, CKMBINDEX, TROPONINI in the last 168 hours. BNP: Invalid input(s): POCBNP CBG: No results for input(s): GLUCAP in the last 168 hours. D-Dimer No results for input(s): DDIMER in the last 72 hours. Hgb A1c No results for input(s): HGBA1C in the last 72 hours. Lipid Profile No results for input(s): CHOL, HDL, LDLCALC, TRIG, CHOLHDL, LDLDIRECT in the last 72 hours. Thyroid  function studies No results for input(s): TSH, T4TOTAL, T3FREE, THYROIDAB in the last 72 hours.  Invalid input(s):  FREET3 Anemia work up No results for input(s): VITAMINB12, FOLATE, FERRITIN, TIBC, IRON, RETICCTPCT in the last 72 hours. Urinalysis    Component Value Date/Time   COLORURINE AMBER (A) 04/11/2024 0154   APPEARANCEUR CLOUDY (A) 04/11/2024 0154   LABSPEC 1.021 04/11/2024 0154   PHURINE 5.0 04/11/2024 0154   GLUCOSEU 50 (A) 04/11/2024 0154   HGBUR SMALL (A) 04/11/2024 0154   BILIRUBINUR NEGATIVE 04/11/2024 0154   KETONESUR NEGATIVE 04/11/2024 0154   PROTEINUR 100 (A) 04/11/2024 0154   UROBILINOGEN 1.0 07/29/2014 1837   NITRITE NEGATIVE 04/11/2024 0154   LEUKOCYTESUR TRACE (A) 04/11/2024 0154   Sepsis Labs Recent Labs  Lab 04/10/24 2207 04/11/24 0520 04/12/24 0531 04/13/24 0545  WBC 7.3 7.2 8.3 7.8   Microbiology Recent Results (from the past 240 hours)  Resp panel by RT-PCR (RSV, Flu A&B, Covid) Anterior Nasal Swab     Status: None   Collection Time: 04/11/24  4:44 AM   Specimen: Anterior Nasal Swab  Result Value Ref Range Status   SARS Coronavirus 2 by RT PCR NEGATIVE NEGATIVE Final    Comment: (NOTE) SARS-CoV-2 target nucleic acids are NOT DETECTED.  The SARS-CoV-2 RNA is generally detectable in upper respiratory specimens during the acute phase of infection. The lowest concentration of SARS-CoV-2 viral copies this assay can detect  is 138 copies/mL. A negative result does not preclude SARS-Cov-2 infection and should not be used as the sole basis for treatment or other patient management decisions. A negative result may occur with  improper specimen collection/handling, submission of specimen other than nasopharyngeal swab, presence of viral mutation(s) within the areas targeted by this assay, and inadequate number of viral copies(<138 copies/mL). A negative result must be combined with clinical observations, patient history, and epidemiological information. The expected result is Negative.  Fact Sheet for Patients:  BloggerCourse.com  Fact Sheet for Healthcare Providers:  SeriousBroker.it  This test is no t yet approved or cleared by the [place]  FDA and  has been authorized for detection and/or diagnosis of SARS-CoV-2 by FDA under an Emergency Use Authorization (EUA). This EUA will remain  in effect (meaning this test can be used) for the duration of the COVID-19 declaration under Section 564(b)(1) of the Act, 21 U.S.C.section 360bbb-3(b)(1), unless the authorization is terminated  or revoked sooner.       Influenza A by PCR NEGATIVE NEGATIVE Final   Influenza B by PCR NEGATIVE NEGATIVE Final    Comment: (NOTE) The Xpert Xpress SARS-CoV-2/FLU/RSV plus assay is intended as an aid in the diagnosis of influenza from Nasopharyngeal swab specimens and should not be used as a sole basis for treatment. Nasal washings and aspirates are unacceptable for Xpert Xpress SARS-CoV-2/FLU/RSV testing.  Fact Sheet for Patients: BloggerCourse.com  Fact Sheet for Healthcare Providers: SeriousBroker.it  This test is not yet approved or cleared by the [place]  FDA and has been authorized for detection and/or diagnosis of SARS-CoV-2 by FDA under an Emergency Use Authorization (EUA). This EUA will remain in effect  (meaning this test can be used) for the duration of the COVID-19 declaration under Section 564(b)(1) of the Act, 21 U.S.C. section 360bbb-3(b)(1), unless the authorization is terminated or revoked.     Resp Syncytial Virus by PCR NEGATIVE NEGATIVE Final    Comment: (NOTE) Fact Sheet for Patients: BloggerCourse.com  Fact Sheet for Healthcare Providers: SeriousBroker.it  This test is not yet approved or cleared by the [place]  FDA and has been authorized for detection and/or diagnosis of SARS-CoV-2 by FDA  under an Emergency Use Authorization (EUA). This EUA will remain in effect (meaning this test can be used) for the duration of the COVID-19 declaration under Section 564(b)(1) of the Act, 21 U.S.C. section 360bbb-3(b)(1), unless the authorization is terminated or revoked.  Performed at [facility], [address], [place], [place] [address]      Time coordinating discharge: 25 minutes  SIGNED: Lesa Rape, MD  [facility] 04/13/2024, 11:55 AM  If 7PM-7AM, please contact night-coverage [email or web address]

## 2024-04-13 NOTE — TOC Progression Note (Signed)
 Transition of Care [facility]) - Progression Note    Patient Details  Name: Richard Lopez MRN: [id number] Date of Birth: 1976-03-25  Transition of [facility]) CM/SW Contact  Marty Sleet, LCSW Phone Number: 04/13/2024, 1:17 PM  Clinical Narrative:    Bus pass provided to pt. Educated pt on how to use bus system and showed how to put in directions to [employer].    Expected Discharge Plan: Home/Self Care    Expected Discharge Plan and Services         Expected Discharge Date: 04/13/24                                     Social Determinants of Health (SDOH) Interventions SDOH Screenings   Food Insecurity: No Food Insecurity (04/11/2024)  Housing: Low Risk  (04/11/2024)  Transportation Needs: Unmet Transportation Needs (04/11/2024)  Utilities: Not At Risk (04/11/2024)  Alcohol Screen: Low Risk  (09/16/2023)  Depression (PHQ2-9): Low Risk  (03/21/2024)  Recent Concern: Depression (PHQ2-9) - High Risk (01/15/2024)  Financial Resource Strain: Medium Risk (12/25/2023)  Physical Activity: Unknown (11/07/2018)  Social Connections: Moderately Isolated (09/16/2023)  Stress: Stress Concern Present (12/25/2023)  Tobacco Use: Medium Risk (04/12/2024)  Health Literacy: Inadequate Health Literacy (09/16/2023)    Readmission Risk Interventions    04/11/2024    3:34 PM 03/17/2024   10:42 AM 08/25/2023   12:27 PM  Readmission Risk Prevention Plan  Transportation Screening Complete Complete Complete  PCP or Specialist Appt within 3-5 Days  Complete Complete  HRI or Home Care Consult  Complete Complete  Social Work Consult for Recovery Care Planning/Counseling  Complete Complete  Palliative Care Screening  Not Applicable Not Applicable  Medication Review Oceanographer) Complete Complete Complete  PCP or Specialist appointment within 3-5 days of discharge Complete    HRI or Home Care Consult Complete    SW Recovery Care/Counseling Consult Complete    Palliative Care Screening Not  Applicable    Skilled Nursing Facility Not Applicable

## 2024-04-14 ENCOUNTER — Telehealth: Payer: Self-pay | Admitting: *Deleted

## 2024-04-14 LAB — SURGICAL PATHOLOGY

## 2024-04-14 NOTE — Transitions of Care (Post Inpatient/ED Visit) (Signed)
 04/14/2024  Name: Richard Lopez MRN: [id number] DOB: 28-Dec-1975  Today's TOC FU Call Status: Today's TOC FU Call Status:: Successful TOC FU Call Completed TOC FU Call Complete Date: 04/14/24 Patient's Name and Date of Birth confirmed.  Transition Care Management Follow-up Telephone Call Date of Discharge: 04/13/24 Discharge Facility: [facility]) Type of Discharge: Inpatient Admission Primary Inpatient Discharge Diagnosis:: Nausea and vomiting How have you been since you were released from the hospital?: Better Any questions or concerns?: No  Items Reviewed: Did you receive and understand the discharge instructions provided?: Yes Medications obtained,verified, and reconciled?: Yes (Medications Reviewed) Any new allergies since your discharge?: No Dietary orders reviewed?: Yes Type of Diet Ordered:: soft diet, avoid smoking and alcohol Do you have support at home?: Yes People in Home [RPT]: friend(s) Name of Support/Comfort Primary Source: 2 best friends live very close  Medications Reviewed Today: Medications Reviewed Today     Reviewed by Aura Leeds, RN (Registered Nurse) on 04/14/24 at 1237  Med List Status: <None>   Medication Order Taking? Sig Documenting Provider Last Dose Status Informant  amLODipine  (NORVASC ) 10 MG tablet [id number] Yes Take 1 tablet (10 mg total) by mouth daily. Unk Garb, DO  Active Self, Pharmacy Records, Multiple Informants           Med Note [name]   Wed Mar 16, 2024 10:21 AM) Recent Dispenses 01/06/2024 10 MG TABS (disp 30, 30d supply) 08/27/2023 10 MG TABS (disp 30, 30d supply) 04/23/2023 10 MG TABS (disp 30, 30d supply)    Multiple Vitamin (MULTIVITAMIN WITH MINERALS) TABS tablet [id number] Yes Take 1 tablet by mouth daily. Marius Siemens, NP  Active Self  ondansetron  (ZOFRAN ) 4 MG tablet [id number] Yes Take 1 tablet (4 mg total) by mouth every 8 (eight) hours as needed for nausea or vomiting. Haydee Lipa, MD   Active Self  pantoprazole  (PROTONIX ) 40 MG tablet [id number] Yes Take 2 tablets (80 mg total) by mouth daily.  Patient taking differently: Take 2 tablets (80 mg total) by mouth daily.   Lesa Rape, MD  Active   Potassium 99 MG TABS [id number] Yes Take 1 tablet by mouth daily. [provider]  Active Self  sucralfate  (CARAFATE ) 1 GM/10ML suspension [id number] Yes Take 10 mLs (1 g total) by mouth 4 (four) times daily -  with meals and at bedtime. Lesa Rape, MD  Active   Med List Note [name], [name] 03/16/24 1231): History of intermittent compliance             Home Care and Equipment/Supplies: Were Home Health Services Ordered?: No Any new equipment or medical supplies ordered?: No  Functional Questionnaire: Do you need assistance with bathing/showering or dressing?: No Do you need assistance with meal preparation?: No Do you need assistance with eating?: No Do you have difficulty maintaining continence: No Do you need assistance with getting out of bed/getting out of a chair/moving?: No Do you have difficulty managing or taking your medications?: No  Follow up appointments reviewed: PCP Follow-up appointment confirmed?: No (Patient prefers to keep upcoming appointment on 06/01/24) MD Provider Line Number:[phone] Given: No Specialist Hospital Follow-up appointment confirmed?: No Reason Specialist Follow-Up Not Confirmed: Patient has Specialist Provider Number and will Call for Appointment Do you need transportation to your follow-up appointment?: No (Patient will schedule with transportation provided by [facility]) Do you understand care options if your condition(s) worsen?: Yes-patient verbalized understanding  SDOH Interventions Today    Flowsheet Row Most Recent Value  SDOH Interventions   Food Insecurity Interventions Intervention Not Indicated  Housing Interventions Intervention Not Indicated  Transportation Interventions Payor Benefit  [Patient will  schedule with medical transportation provided by [employer]]  Utilities Interventions Intervention Not Indicated    Goals Addressed             This Visit's Progress    VBCI Transitions of Care (TOC) Care Plan       Problems:  Recent Hospitalization for treatment of Intractable Nausea and Vomiting Knowledge Deficit Related to GI issues  Goal:  Over the next 30 days, the patient will not experience hospital readmission  Interventions:  Transitions of Care: Doctor Visits  - discussed the importance of doctor visits Post discharge activity limitations prescribed by provider reviewed Offered to schedule hospital follow up with PCP and GI providers-patient has contact numbers and will call to schedule Reviewed discharge instructions and discussed the importance of diet modification, medication compliance, stopping alcohol and smoking Medication review Collaborated with PCP to request a BP monitor  Patient Self Care Activities:  Call pharmacy for medication refills 3-7 days in advance of running out of medications Call provider office for new concerns or questions  Participate in Transition of Care Program/Attend TOC scheduled calls Take medications as prescribed   Schedule follow up visits  Plan:  Telephone follow up appointment with care management team member scheduled for:  04/21/24 at 10:30am        Arna Better RN, BSN [place]  [facility]Based [facility] RN Care Manager [phone]

## 2024-04-15 ENCOUNTER — Encounter (HOSPITAL_COMMUNITY): Payer: Self-pay | Admitting: Gastroenterology

## 2024-04-21 ENCOUNTER — Other Ambulatory Visit: Payer: Self-pay | Admitting: *Deleted

## 2024-04-21 NOTE — Patient Instructions (Signed)
 Visit Information  Thank you for taking time to visit with me today. Please don't hesitate to contact me if I can be of assistance to you before our next scheduled telephone appointment.   Following is a copy of your care plan:   Goals Addressed             This Visit's Progress    VBCI Transitions of Care (TOC) Care Plan       Problems:  Recent Hospitalization for treatment of Intractable Nausea and Vomiting Knowledge Deficit Related to GI issues  Goal:  Over the next 30 days, the patient will not experience hospital readmission  Interventions:  Transitions of Care: Doctor Visits  - discussed the importance of doctor visits Post discharge activity limitations prescribed by provider reviewed Advised patient to schedule hospital follow up with PCP Reviewed discharge instructions and discussed the importance of diet modification, medication compliance, stopping alcohol and smoking Medication review Collaborated with PCP to request a BP monitor-resent secure communication Provided encouragement to continue having no alcohol Discussed GI follow up-patient reports scheduling for 05/02/24 and has arranged transportation Provided information for Ortho referral placed by [facility] [phone]  Patient Self Care Activities:  Call pharmacy for medication refills 3-7 days in advance of running out of medications Call provider office for new concerns or questions  Participate in Transition of Care Program/Attend TOC scheduled calls Take medications as prescribed   Schedule follow up visits  Plan:  Telephone follow up appointment with care management team member scheduled for:  04/28/24 at 10:30am        Patient verbalizes understanding of instructions and care plan provided today and agrees to view in MyChart. Active MyChart status and patient understanding of how to access instructions and care plan via MyChart confirmed with patient.     Telephone follow up appointment with  care management team member scheduled for:04/28/24 at 10:30am  Please call the care guide team at [phone] if you need to cancel or reschedule your appointment.   Please call 1-800-273-TALK (toll free, 24 hour hotline) go to [facility]gent [facility] [address], [place] [phone]) call 911 if you are experiencing a Mental Health or Behavioral Health Crisis or need someone to talk to.  Arna Better RN, BSN [place]  Value-Based Care Institute [facility] Health RN Care Manager [phone]

## 2024-04-21 NOTE — Transitions of Care (Post Inpatient/ED Visit) (Signed)
 Transition of Care week 2  Visit Note  04/21/2024  Name: Richard Lopez MRN: [id number]          DOB: [birth year]  Situation: Patient enrolled in [facility] 30-day program. Visit completed with Richard Lopez by telephone.   Background:   Initial Transition Care Management Follow-up Telephone Call    Past Medical History:  Diagnosis Date   Alcohol dependence with intoxication (HCC) 03/25/2019   Anxiety    Arthritis    hands and possibly knee   Atrial fibrillation with RVR (HCC) 04/22/2020   Closed right hip fracture, initial encounter (HCC) 03/25/2019   Depression    Diabetes mellitus    diet controlled   Esophagitis determined by endoscopy 08/21/2019   Essential hypertension    Gastritis    Gout    Intertrochanteric fracture of right hip (HCC) 04/14/2019   Normocytic anemia due to blood loss 08/21/2019   Pulmonary embolism (HCC)     Assessment: Patient Reported Symptoms: Cognitive Cognitive Status: Able to follow simple commands, Alert and oriented to person, place, and time, Normal speech and language skills, Insightful and able to interpret abstract concepts   Health Maintenance Behaviors: Annual physical exam Healing Pattern: Average  Neurological Neurological Review of Symptoms: No symptoms reported    HEENT HEENT Symptoms Reported: No symptoms reported      Cardiovascular Cardiovascular Symptoms Reported: No symptoms reported    Respiratory Respiratory Symptoms Reported: No symptoms reported    Endocrine Patient reports the following symptoms related to hypoglycemia or hyperglycemia : No symptoms reported Is patient diabetic?: No    Gastrointestinal Gastrointestinal Symptoms Reported: No symptoms reported Gastrointestinal Self-Management Outcome: 4 (good) Gastrointestinal Comment: Patient was able to eat a sandwich yesterday. Follow up with [facility] 05/02/24 at 8:30am    Genitourinary Genitourinary Symptoms Reported: No symptoms reported    Integumentary  Integumentary Symptoms Reported: No symptoms reported    Musculoskeletal Musculoskelatal Symptoms Reviewed: Weakness Additional Musculoskeletal Details: Patient relates weakness to not having alcohol for one week. Musculoskeletal Self-Management Outcome: 4 (good) Falls in the past year?: No    Psychosocial Psychosocial Symptoms Reported: No symptoms reported         There were no vitals filed for this visit.  Medications Reviewed Today     Reviewed by Aura Leeds, RN (Registered Nurse) on 04/21/24 at 1045  Med List Status: <None>   Medication Order Taking? Sig Documenting Provider Last Dose Status Informant  amLODipine  (NORVASC ) 10 MG tablet [id number] Yes Take 1 tablet (10 mg total) by mouth daily. Unk Garb, DO  Active Self, Pharmacy Records, Multiple Informants           Med Note [name]   Wed Mar 16, 2024 10:21 AM) Recent Dispenses 01/06/2024 10 MG TABS (disp 30, 30d supply) 08/27/2023 10 MG TABS (disp 30, 30d supply) 04/23/2023 10 MG TABS (disp 30, 30d supply)    Multiple Vitamin (MULTIVITAMIN WITH MINERALS) TABS tablet [id number] Yes Take 1 tablet by mouth daily. Marius Siemens, NP  Active Self  ondansetron  (ZOFRAN ) 4 MG tablet [id number] Yes Take 1 tablet (4 mg total) by mouth every 8 (eight) hours as needed for nausea or vomiting. Haydee Lipa, MD  Active Self  pantoprazole  (PROTONIX ) 40 MG tablet [id number] Yes Take 2 tablets (80 mg total) by mouth daily.  Patient taking differently: Take 2 tablets (80 mg total) by mouth daily.   Lesa Rape, MD  Active   Potassium 99 MG TABS [id number] Yes Take 1 tablet  by mouth daily. [provider]  Active Self  sucralfate  (CARAFATE ) 1 GM/10ML suspension [id number] Yes Take 10 mLs (1 g total) by mouth 4 (four) times daily -  with meals and at bedtime. Lesa Rape, MD  Active   Med List Note [name], [name] 03/16/24 1231): History of intermittent compliance             Recommendation:    DME requests:  other BP monitor  Follow Up Plan:   Telephone follow-up in 1 week  Arna Better RN, BSN [place]  Value-Based Care Institute [facility] Health RN Care Manager [phone]

## 2024-04-24 ENCOUNTER — Other Ambulatory Visit (INDEPENDENT_AMBULATORY_CARE_PROVIDER_SITE_OTHER): Payer: Self-pay | Admitting: Primary Care

## 2024-04-24 DIAGNOSIS — I10 Essential (primary) hypertension: Secondary | ICD-10-CM

## 2024-04-24 MED ORDER — BLOOD GLUCOSE-BP MONITOR DEVI
1.0000 | Freq: Three times a day (TID) | 0 refills | Status: AC
Start: 2024-04-24 — End: ?

## 2024-04-28 ENCOUNTER — Other Ambulatory Visit: Payer: Self-pay | Admitting: *Deleted

## 2024-04-28 NOTE — Patient Instructions (Signed)
 Visit Information  Thank you for taking time to visit with me today. Please don't hesitate to contact me if I can be of assistance to you before our next scheduled telephone appointment.  Following is a copy of your care plan:   Goals Addressed             This Visit's Progress    VBCI Transitions of Care (TOC) Care Plan       Problems:  Recent Hospitalization for treatment of Intractable Nausea and Vomiting Knowledge Deficit Related to GI issues  Goal:  Over the next 30 days, the patient will not experience hospital readmission  Interventions:  Transitions of Care: Doctor Visits  - discussed the importance of doctor visits Post discharge activity limitations prescribed by provider reviewed Advised patient to schedule hospital follow up with PCP-patient prefers to keep previously scheduled visit on 06/01/24 Medication review Collaborated with PCP to request a BP monitor-advised patient to contact pharmacy for ordered BP monitor Provided encouragement to continue having no alcohol Discussed GI follow up-patient reports scheduling for 05/02/24 and has arranged transportation Provided information for Ortho referral placed by [facility] [phone] not scheduled Discussed the importance of staying cool, increase hydration with water and fruits, avoiding activities outdoors during 10am-4pm while the temperature is so hot  Patient Self Care Activities:  Call pharmacy for medication refills 3-7 days in advance of running out of medications Call provider office for new concerns or questions  Participate in Transition of Care Program/Attend TOC scheduled calls Take medications as prescribed   Schedule follow up visits  Plan:  Telephone follow up appointment with care management team member scheduled for:  05/05/24 at 11:15am        Patient verbalizes understanding of instructions and care plan provided today and agrees to view in MyChart. Active MyChart status and patient  understanding of how to access instructions and care plan via MyChart confirmed with patient.     Telephone follow up appointment with care management team member scheduled for:05/05/24 at 11:15am  Please call the care guide team at [phone] if you need to cancel or reschedule your appointment.   Please call 1-800-273-TALK (toll free, 24 hour hotline) go to [facility] Urgent [facility] [address], [place] [phone]) call 911 if you are experiencing a Mental Health or Behavioral Health Crisis or need someone to talk to.  Andrea Dimes RN, BSN [place]  Value-Based Care Institute [facility] Health RN Care Manager [phone]

## 2024-04-28 NOTE — Transitions of Care (Post Inpatient/ED Visit) (Signed)
 Transition of Care week 3  Visit Note  04/28/2024  Name: Richard Lopez MRN: [id number]          DOB: 07-09-1976  Situation: Patient enrolled in [facility] 30-day program. Visit completed with Richard Lopez by telephone.   Background:   Initial Transition Care Management Follow-up Telephone Call    Past Medical History:  Diagnosis Date   Alcohol dependence with intoxication (HCC) 03/25/2019   Anxiety    Arthritis    hands and possibly knee   Atrial fibrillation with RVR (HCC) 04/22/2020   Closed right hip fracture, initial encounter (HCC) 03/25/2019   Depression    Diabetes mellitus    diet controlled   Esophagitis determined by endoscopy 08/21/2019   Essential hypertension    Gastritis    Gout    Intertrochanteric fracture of right hip (HCC) 04/14/2019   Normocytic anemia due to blood loss 08/21/2019   Pulmonary embolism (HCC)     Assessment: Patient Reported Symptoms: Cognitive Cognitive Status: Able to follow simple commands, Alert and oriented to person, place, and time, Normal speech and language skills, Insightful and able to interpret abstract concepts      Neurological Neurological Review of Symptoms: No symptoms reported    HEENT HEENT Symptoms Reported: No symptoms reported      Cardiovascular Cardiovascular Symptoms Reported: No symptoms reported    Respiratory Respiratory Symptoms Reported: No symptoms reported    Endocrine Patient reports the following symptoms related to hypoglycemia or hyperglycemia : No symptoms reported    Gastrointestinal Gastrointestinal Symptoms Reported: No symptoms reported      Genitourinary Genitourinary Symptoms Reported: No symptoms reported    Integumentary Integumentary Symptoms Reported: No symptoms reported    Musculoskeletal Musculoskelatal Symptoms Reviewed: No symptoms reported        Psychosocial Psychosocial Symptoms Reported: No symptoms reported         There were no vitals filed for this  visit.  Medications Reviewed Today     Reviewed by Lucky Andrea LABOR, RN (Registered Nurse) on 04/28/24 at 1051  Med List Status: <None>   Medication Order Taking? Sig Documenting Provider Last Dose Status Informant  amLODipine  (NORVASC ) 10 MG tablet [id number] Yes Take 1 tablet (10 mg total) by mouth daily. Laurence Locus, DO  Active Self, Pharmacy Records, Multiple Informants           Med Note (Jennet Scroggin A   Thu Apr 28, 2024 10:49 AM)    Blood Glucose-BP Monitor (BLOOD GLUCOSE-WRIST BP MONITOR) DEVI [id number]  1 Bag by Does not apply route in the morning, at noon, and at bedtime.  Patient not taking: Reported on 04/28/2024   Celestia Rosaline SQUIBB, NP  Active   Multiple Vitamin (MULTIVITAMIN WITH MINERALS) TABS tablet [id number]  Take 1 tablet by mouth daily.  Patient not taking: Reported on 04/28/2024   Celestia Rosaline SQUIBB, NP  Active Self  ondansetron  (ZOFRAN ) 4 MG tablet [id number] Yes Take 1 tablet (4 mg total) by mouth every 8 (eight) hours as needed for nausea or vomiting. Lue Elsie BROCKS, MD  Active Self  pantoprazole  (PROTONIX ) 40 MG tablet [id number] Yes Take 2 tablets (80 mg total) by mouth daily. Christobal Guadalajara, MD  Active   Potassium 99 MG TABS [id number] Yes Take 1 tablet by mouth daily. [provider]  Active Self  sucralfate  (CARAFATE ) 1 GM/10ML suspension [id number] Yes Take 10 mLs (1 g total) by mouth 4 (four) times daily -  with meals and at bedtime. Christobal Guadalajara, MD  Active   Med List Note Steffi Nian, [name] 03/16/24 1231): History of intermittent compliance             Recommendation:   Continue Current Plan of Care  Follow Up Plan:   Telephone follow-up in 1 week  Andrea Dimes RN, BSN [place]  Value-Based Care Institute [facility] RN Care Manager [phone]

## 2024-05-05 ENCOUNTER — Other Ambulatory Visit: Payer: Self-pay | Admitting: *Deleted

## 2024-05-05 NOTE — Transitions of Care (Post Inpatient/ED Visit) (Signed)
 Transition of Care week 4  Visit Note  05/05/2024  Name: Richard Lopez MRN: [id number]          DOB: 01/28/1976  Situation: Patient enrolled in [facility] 30-day program. Visit completed with Mr. Brogden by telephone.   Background:   Initial Transition Care Management Follow-up Telephone Call    Past Medical History:  Diagnosis Date   Alcohol dependence with intoxication (HCC) 03/25/2019   Anxiety    Arthritis    hands and possibly knee   Atrial fibrillation with RVR (HCC) 04/22/2020   Closed right hip fracture, initial encounter (HCC) 03/25/2019   Depression    Diabetes mellitus    diet controlled   Esophagitis determined by endoscopy 08/21/2019   Essential hypertension    Gastritis    Gout    Intertrochanteric fracture of right hip (HCC) 04/14/2019   Normocytic anemia due to blood loss 08/21/2019   Pulmonary embolism (HCC)     Assessment: Patient Reported Symptoms: Cognitive Cognitive Status: No symptoms reported      Neurological Neurological Review of Symptoms: No symptoms reported    HEENT HEENT Symptoms Reported: No symptoms reported      Cardiovascular Cardiovascular Symptoms Reported: No symptoms reported    Respiratory Respiratory Symptoms Reported: No symptoms reported    Endocrine Endocrine Symptoms Reported: Not assessed    Gastrointestinal Gastrointestinal Symptoms Reported: No symptoms reported Gastrointestinal Self-Management Outcome: 4 (good) Gastrointestinal Comment: Follow up with GI on 05/02/24, scheduled for Endoscopy and Colonoscopy on 06/13/24    Genitourinary Genitourinary Symptoms Reported: No symptoms reported    Integumentary Integumentary Symptoms Reported: Not assessed    Musculoskeletal Musculoskelatal Symptoms Reviewed: No symptoms reported        Psychosocial Psychosocial Symptoms Reported: Not assessed         There were no vitals filed for this visit.  Medications Reviewed Today     Reviewed by Lucky Andrea LABOR, RN  (Registered Nurse) on 05/05/24 at 1116  Med List Status: <None>   Medication Order Taking? Sig Documenting Provider Last Dose Status Informant  amLODipine  (NORVASC ) 10 MG tablet [id number] Yes Take 1 tablet (10 mg total) by mouth daily. Laurence Locus, DO  Active Self, Pharmacy Records, Multiple Informants           Med Note (Glyn Gerads A   Thu Apr 28, 2024 10:49 AM)    Blood Glucose-BP Monitor (BLOOD GLUCOSE-WRIST BP MONITOR) DEVI [id number]  1 Bag by Does not apply route in the morning, at noon, and at bedtime.  Patient not taking: Reported on 05/05/2024   Celestia Rosaline SQUIBB, NP  Active   Multiple Vitamin (MULTIVITAMIN WITH MINERALS) TABS tablet [id number]  Take 1 tablet by mouth daily.  Patient not taking: Reported on 05/05/2024   Celestia Rosaline SQUIBB, NP  Active Self  ondansetron  (ZOFRAN ) 4 MG tablet [id number] Yes Take 1 tablet (4 mg total) by mouth every 8 (eight) hours as needed for nausea or vomiting. Lue Elsie BROCKS, MD  Active Self  pantoprazole  (PROTONIX ) 40 MG tablet [id number] Yes Take 2 tablets (80 mg total) by mouth daily. Christobal Guadalajara, MD  Active   Potassium 99 MG TABS [id number] Yes Take 1 tablet by mouth daily. [provider]  Active Self  sucralfate  (CARAFATE ) 1 GM/10ML suspension [id number] Yes Take 10 mLs (1 g total) by mouth 4 (four) times daily -  with meals and at bedtime. Christobal Guadalajara, MD  Active   Med List Note Steffi Nian, [name] 03/16/24 1231): History of intermittent  compliance             Recommendation:   Continue Current Plan of Care  Follow Up Plan:   Telephone follow-up in 1 week  Andrea Dimes RN, BSN [place]  Value-Based Care Institute [facility] Health RN Care Manager [phone]

## 2024-05-05 NOTE — Patient Instructions (Signed)
 Visit Information  Thank you for taking time to visit with me today. Please don't hesitate to contact me if I can be of assistance to you before our next scheduled telephone appointment.   Following is a copy of your care plan:   Goals Addressed             This Visit's Progress    VBCI Transitions of Care (TOC) Care Plan       Problems:  Recent Hospitalization for treatment of Intractable Nausea and Vomiting Knowledge Deficit Related to GI issues  Goal:  Over the next 30 days, the patient will not experience hospital readmission  Interventions:  Transitions of Care: Doctor Visits  - discussed the importance of doctor visits Post discharge activity limitations prescribed by provider reviewed Advised patient to schedule hospital follow up with PCP-patient prefers to keep previously scheduled visit on 06/01/24 Medication review Provided encouragement to continue having no alcohol-revisited Discussed GI follow up on 05/02/24, scheduled for Endoscopy/Colonoscopy on 06/13/24 Provided information for Ortho referral placed by [facility] [phone] not scheduled  Patient Self Care Activities:  Call pharmacy for medication refills 3-7 days in advance of running out of medications Call provider office for new concerns or questions  Participate in Transition of Care Program/Attend TOC scheduled calls Take medications as prescribed   Schedule follow up visits  Plan:  Telephone follow up appointment with care management team member scheduled for:  05/11/24 at 2:30pm        Patient verbalizes understanding of instructions and care plan provided today and agrees to view in MyChart. Active MyChart status and patient understanding of how to access instructions and care plan via MyChart confirmed with patient.     Telephone follow up appointment with care management team member scheduled for:05/11/24 at 2:30pm  Please call the care guide team at [phone] if you need to cancel or  reschedule your appointment.   Please call 1-800-273-TALK (toll free, 24 hour hotline) go to [facility] [address], [place] [phone]) call 911 if you are experiencing a Mental Health or Behavioral Health Crisis or need someone to talk to.  Andrea Dimes RN, BSN [place]  [facility] RN Care Manager [phone]

## 2024-05-11 ENCOUNTER — Other Ambulatory Visit: Payer: Self-pay | Admitting: *Deleted

## 2024-05-11 DIAGNOSIS — I10 Essential (primary) hypertension: Secondary | ICD-10-CM

## 2024-05-11 NOTE — Transitions of Care (Post Inpatient/ED Visit) (Signed)
 Transition of Care week 5  Visit Note  05/11/2024  Name: Richard Lopez MRN: [id number]          DOB: Aug 30, 1976  Situation: Patient enrolled in [facility] 30-day program. Visit completed with Mr. Kovaleski by telephone.   Background:   Initial Transition Care Management Follow-up Telephone Call    Past Medical History:  Diagnosis Date   Alcohol dependence with intoxication (HCC) 03/25/2019   Anxiety    Arthritis    hands and possibly knee   Atrial fibrillation with RVR (HCC) 04/22/2020   Closed right hip fracture, initial encounter (HCC) 03/25/2019   Depression    Diabetes mellitus    diet controlled   Esophagitis determined by endoscopy 08/21/2019   Essential hypertension    Gastritis    Gout    Intertrochanteric fracture of right hip (HCC) 04/14/2019   Normocytic anemia due to blood loss 08/21/2019   Pulmonary embolism (HCC)     Assessment: Patient Reported Symptoms: Cognitive Cognitive Status: No symptoms reported      Neurological Neurological Review of Symptoms: No symptoms reported    HEENT HEENT Symptoms Reported: No symptoms reported      Cardiovascular Cardiovascular Symptoms Reported: No symptoms reported Does patient have uncontrolled Hypertension?: Yes Is patient checking Blood Pressure at home?: No (Patient has not received ordered BP monitor) Patient's Recent BP reading at home: Patient reports normal BP at GI appointment on 05/02/24-unable to provide exact reading, RNCM unable to see note Cardiovascular Management Strategies: Medication therapy Cardiovascular Self-Management Outcome: 3 (uncertain)  Respiratory Respiratory Symptoms Reported: No symptoms reported    Endocrine Endocrine Symptoms Reported: Not assessed    Gastrointestinal Gastrointestinal Symptoms Reported: No symptoms reported Gastrointestinal Self-Management Outcome: 4 (good)    Genitourinary Genitourinary Symptoms Reported: Not assessed    Integumentary Integumentary Symptoms Reported: Not  assessed    Musculoskeletal Musculoskelatal Symptoms Reviewed: Joint pain Additional Musculoskeletal Details: Right hip pain, 7-8/10. Patient has not scheduled follow up with Orthopedic. He has contact information and will call to schedule Musculoskeletal Self-Management Outcome: 3 (uncertain)      Psychosocial Psychosocial Symptoms Reported: Not assessed   Major Change/Loss/Stressor/Fears (CP):  (financial concerns) Quality of Family Relationships: supportive, helpful, involved   There were no vitals filed for this visit.  Medications Reviewed Today     Reviewed by Lucky Andrea LABOR, RN (Registered Nurse) on 05/11/24 at 1457  Med List Status: <None>   Medication Order Taking? Sig Documenting Provider Last Dose Status Informant  amLODipine  (NORVASC ) 10 MG tablet [id number] Yes Take 1 tablet (10 mg total) by mouth daily. Laurence Locus, DO  Active Self, Pharmacy Records, Multiple Informants           Med Note (Sherlin Sonier A   Thu Apr 28, 2024 10:49 AM)    Blood Glucose-BP Monitor (BLOOD GLUCOSE-WRIST BP MONITOR) DEVI [id number]  1 Bag by Does not apply route in the morning, at noon, and at bedtime.  Patient not taking: Reported on 05/11/2024   Celestia Rosaline SQUIBB, NP  Active   Multiple Vitamin (MULTIVITAMIN WITH MINERALS) TABS tablet [id number]  Take 1 tablet by mouth daily.  Patient not taking: Reported on 05/11/2024   Celestia Rosaline SQUIBB, NP  Active Self  ondansetron  (ZOFRAN ) 4 MG tablet [id number] Yes Take 1 tablet (4 mg total) by mouth every 8 (eight) hours as needed for nausea or vomiting. Lue Elsie BROCKS, MD  Active Self  pantoprazole  (PROTONIX ) 40 MG tablet [id number] Yes Take 2 tablets (80 mg total) by  mouth daily. Christobal Guadalajara, MD  Active   Potassium 99 MG TABS [id number] Yes Take 1 tablet by mouth daily. [provider]  Active Self  sucralfate  (CARAFATE ) 1 GM/10ML suspension [id number] Yes Take 10 mLs (1 g total) by mouth 4 (four) times daily -  with meals and at bedtime. Christobal Guadalajara, MD  Active   Med List Note Steffi Nian, [name] 03/16/24 1231): History of intermittent compliance             Recommendation:   Continue Current Plan of Care  Follow Up Plan:   Referral to RN Case Manager BSW Closing From:  Transitions of Care Program  Andrea Dimes RN, BSN [place]  [facility] Institute [facility] RN Care Manager [phone]

## 2024-05-16 ENCOUNTER — Other Ambulatory Visit: Payer: Self-pay | Admitting: Primary Care

## 2024-05-16 DIAGNOSIS — I10 Essential (primary) hypertension: Secondary | ICD-10-CM

## 2024-05-16 MED ORDER — AMLODIPINE BESYLATE 10 MG PO TABS: 10.0000 mg | ORAL_TABLET | Freq: Every day | ORAL | 0 refills | Status: DC

## 2024-05-16 MED ORDER — BLOOD PRESSURE MONITOR AUTOMAT DEVI: 1.0000 | Freq: Three times a day (TID) | 0 refills | Status: AC

## 2024-05-18 ENCOUNTER — Telehealth: Payer: Self-pay

## 2024-05-18 NOTE — Progress Notes (Signed)
 Complex Care Management Note  Care Guide Note 05/18/2024 Name: Richard Lopez MRN: [id number] DOB: 1976/10/11  Richard Lopez is a 48 y.o. year old male who sees Celestia Rosaline SQUIBB, NP for primary care. I reached out to Richard Lopez by phone today to offer complex care management services.  Richard Lopez was given information about Complex Care Management services today including:   The Complex Care Management services include support from the care team which includes your Nurse Care Manager, Clinical Social Worker, or Pharmacist.  The Complex Care Management team is here to help remove barriers to the health concerns and goals most important to you. Complex Care Management services are voluntary, and the patient may decline or stop services at any time by request to their care team member.   Complex Care Management Consent Status: Patient agreed to services and verbal consent obtained.   Follow up plan:  Telephone appointment with complex care management team member scheduled for:  BSW 05/27/2024 RNCM 05/31/2024  Encounter Outcome:  Patient Scheduled  Jeoffrey Buffalo , RMA     [place]  [facility], [facility] Guide  Direct Dial: [phone]  Website: [email or web address]

## 2024-05-19 ENCOUNTER — Telehealth (INDEPENDENT_AMBULATORY_CARE_PROVIDER_SITE_OTHER): Payer: Self-pay

## 2024-05-19 NOTE — Telephone Encounter (Signed)
 Received staff message. RX was faxed today

## 2024-05-19 NOTE — Telephone Encounter (Signed)
 Celestia Rosaline SQUIBB, NP  Casimir Juvenal SAUNDERS, RMA Was this faxed?       Previous Messages    ----- Message ----- From: Lucky Andrea LABOR, RN Sent: 05/11/2024   2:52 PM EDT To: Rosaline SQUIBB Celestia, NP Subject: DME                                            Hi Rosaline Harpin is unable to fill ordered BP monitor for Mr. Mcfayden. Please fax a prescription for BP monitor to [facility] [phone]. Thank you

## 2024-05-27 ENCOUNTER — Other Ambulatory Visit: Payer: Self-pay
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
## 2024-05-27 NOTE — Patient Instructions (Signed)
 Visit Information  Mr. Hatlestad was given information about Medicaid Managed Care team care coordination services as a part of their [facility] Community Plan Medicaid benefit. Reyaan Thoma Deckard verbally consented to engagement with the [facility] Managed Care team.   If you are experiencing a medical emergency, please call 911 or report to your local emergency department or urgent care.   If you have a non-emergency medical problem during routine business hours, please contact your provider's office and ask to speak with a nurse.   For questions related to your [facility], please call: [phone] or visit the homepage here: [email or web address]  If you would like to schedule transportation through your [facility], please call the following number at least 2 days in advance of your appointment: [phone]   Rides for urgent appointments can also be made after hours by calling Member Services.  Call the Behavioral Health Crisis Line at [phone], at any time, 24 hours a day, 7 days a week. If you are in danger or need immediate medical attention call 911.  If you would like help to quit smoking, call 1-800-QUIT-NOW (732-004-6660) OR Espaol: 1-855-Djelo-Ya (8-144-664-6430) o para ms informacin haga clic aqu or Text READY to 799-599 to register via text  Mr. Kluver - following are the goals we discussed in your visit today:   Goals Addressed             This Visit's Progress    BSW VBCI Social Work Care Plan   On track    Problems:   Assistance with housing: home repairs   CSW Clinical Goal(s):   Over the next 2 weeks the Patient will work with Child psychotherapist to address concerns related to housing: home repairs.  Interventions:  BSW will refer patient to [facility] for possible assistance.  Patient Goals/Self-Care Activities:  Reach out to [facility] to inquire about  assistance.  Plan:   Telephone follow up appointment with care management team member scheduled for:  06/10/2024 at 2pm       Patient verbalizes understanding of instructions and care plan provided today and agrees to view in MyChart. Active MyChart status and patient understanding of how to access instructions and care plan via MyChart confirmed with patient.     Telephone follow up appointment with Managed Medicaid care management team member scheduled for: 06/10/2024 at [facility]  [place], [place]/VBCI - [facility] Social Worker [phone]   Following is a copy of your plan of care:  There are no care plans that you recently modified to display for this patient.

## 2024-05-31 ENCOUNTER — Other Ambulatory Visit: Payer: Self-pay

## 2024-05-31 ENCOUNTER — Telehealth (INDEPENDENT_AMBULATORY_CARE_PROVIDER_SITE_OTHER): Payer: Self-pay | Admitting: Primary Care

## 2024-05-31 NOTE — Telephone Encounter (Signed)
 Called pt to confirm appt. Pt will call and reschedule when ready to make appt.

## 2024-05-31 NOTE — Patient Outreach (Signed)
 Complex Care Management   Visit Note  05/31/2024  Name:  Richard Lopez MRN: [id number] DOB: [birth year]  Situation: Referral received for Complex Care Management related to HTN I obtained verbal consent from Patient.  Visit completed with [name]  on the phone  Background:   Past Medical History:  Diagnosis Date   Alcohol dependence with intoxication (HCC) 03/25/2019   Anxiety    Arthritis    hands and possibly knee   Atrial fibrillation with RVR (HCC) 04/22/2020   Closed right hip fracture, initial encounter (HCC) 03/25/2019   Depression    Diabetes mellitus    diet controlled   Esophagitis determined by endoscopy 08/21/2019   Essential hypertension    Gastritis    Gout    Intertrochanteric fracture of right hip (HCC) 04/14/2019   Normocytic anemia due to blood loss 08/21/2019   Pulmonary embolism (HCC)     Assessment: Patient Reported Symptoms:  Cognitive Cognitive Status: Able to follow simple commands, Alert and oriented to person, place, and time, Normal speech and language skills Cognitive/Intellectual Conditions Management [RPT]: None reported or documented in medical history or problem list      Neurological Neurological Review of Symptoms: No symptoms reported    HEENT HEENT Symptoms Reported: Other:      Cardiovascular Cardiovascular Symptoms Reported: No symptoms reported Does patient have uncontrolled Hypertension?: Yes Is patient checking Blood Pressure at home?: No Patient's Recent BP reading at home: Patient reports that he just received BP monitor in the mail yesterday. He plans to open it today and start using. Advised to check BP every 1-2 days and keep a log of readings for review at f/u visit. Cardiovascular Management Strategies: Medication therapy  Respiratory Respiratory Symptoms Reported: No symptoms reported Additional Respiratory Details: Patient reports he is still smoking cigarettes occasionally. He estimates he smokes 2 cigarettes a day.  Patient reports every now and then marijuana use, last use about 4 days ago.    Endocrine Endocrine Symptoms Reported: No symptoms reported Is patient diabetic?: Yes Is patient checking blood sugars at home?: No    Gastrointestinal Gastrointestinal Symptoms Reported: No symptoms reported Additional Gastrointestinal Details: Patient reports that issues with nausea and vomiting have resolved. He reports regular BMs, last BM today. Patient reports his appetite is starting to get better. Gastrointestinal Management Strategies: Medication therapy Gastrointestinal Comment: Patient reports he has an endoscopy scheduled on 06/13/24. His next f/u with GI 06/13/24    Genitourinary Genitourinary Symptoms Reported: No symptoms reported    Integumentary Integumentary Symptoms Reported: No symptoms reported    Musculoskeletal Musculoskelatal Symptoms Reviewed: Joint pain Additional Musculoskeletal Details: Patient reports constant R hip pain due to breaking hip twice. He only takes Ibuprofen  but reports it does not work. Patient reports joint pain effects him greatly day-to-day. He does not have an orthopedic doctor. Message sent to PCP. Musculoskeletal Management Strategies: Medication therapy Falls in the past year?: Yes Number of falls in past year: 1 or less Was there an injury with Fall?: No Fall Risk Category Calculator: 1 Patient Fall Risk Level: Low Fall Risk Patient at Risk for Falls Due to: History of fall(s), Impaired balance/gait Fall risk Follow up: Falls evaluation completed, Education provided  Psychosocial Psychosocial Symptoms Reported: Anxiety - if selected complete GAD, Depression - if selected complete PHQ 2-9 Additional Psychological Details: Patient reports he previously saw a therapist for depression and anxiety. He did not feel that it was helpful. Patient declines referral for counseling. PHQ-9 and GAD-7 scores reported  to provider.     Quality of Family Relationships:  helpful, involved, supportive Do you feel physically threatened by others?: No      05/31/2024    2:48 PM  Depression screen PHQ 2/9  Decreased Interest 3  Down, Depressed, Hopeless 3  PHQ - 2 Score 6  Altered sleeping 0  Tired, decreased energy 2  Change in appetite 1  Feeling bad or failure about yourself  1  Trouble concentrating 0  Moving slowly or fidgety/restless 0  Suicidal thoughts 0  PHQ-9 Score 10  Difficult doing work/chores Somewhat difficult    There were no vitals filed for this visit.  Medications Reviewed Today     Reviewed by Arno Rosaline SQUIBB, RN (Registered Nurse) on 05/31/24 at 1452  Med List Status: <None>   Medication Order Taking? Sig Documenting Provider Last Dose Status Informant  amLODipine  (NORVASC ) 10 MG tablet [id number] Yes Take 1 tablet (10 mg total) by mouth daily. Celestia Rosaline SQUIBB, NP  Active   Blood Glucose-BP Monitor (BLOOD GLUCOSE-WRIST BP MONITOR) DEVI [id number]  1 Bag by Does not apply route in the morning, at noon, and at bedtime.  Patient not taking: Reported on 05/11/2024   Celestia Rosaline SQUIBB, NP  Active   Blood Pressure Monitoring (BLOOD PRESSURE MONITOR AUTOMAT) DEVI [id number]  1 kit by Does not apply route in the morning, at noon, and at bedtime. Celestia Rosaline SQUIBB, NP  Active   Multiple Vitamin (MULTIVITAMIN WITH MINERALS) TABS tablet [id number]  Take 1 tablet by mouth daily.  Patient not taking: Reported on 05/11/2024   Celestia Rosaline SQUIBB, NP  Active Self  ondansetron  (ZOFRAN ) 4 MG tablet [id number] Yes Take 1 tablet (4 mg total) by mouth every 8 (eight) hours as needed for nausea or vomiting. Lue Elsie BROCKS, MD  Active Self  pantoprazole  (PROTONIX ) 40 MG tablet [id number]  Take 2 tablets (80 mg total) by mouth daily. Christobal Guadalajara, MD  Active   Potassium 99 MG TABS [id number] Yes Take 1 tablet by mouth daily. [provider]  Active Self  sucralfate  (CARAFATE ) 1 GM/10ML suspension [id number]  Take 10 mLs (1 g total) by  mouth 4 (four) times daily -  with meals and at bedtime. Christobal Guadalajara, MD  Expired 05/13/24 2359   Med List Note Steffi, [place], [name] 03/16/24 1231): History of intermittent compliance             Recommendation:   Specialty provider follow-up GI 06/13/24 Referral to: requested referral for ortho to evaluate chronic joint pain Continue Current Plan of Care  Follow Up Plan:   Telephone follow up appointment date/time:  06/17/24 at 1:30 PM  Rosaline Arno, RN MSN [place]  VBCI Population Health RN Care Manager Direct Dial: [phone]  Fax: [phone]

## 2024-05-31 NOTE — Patient Instructions (Signed)
 Visit Information  Richard Lopez was given information about Medicaid Managed Care team care coordination services as a part of their [facility] Community Plan Medicaid benefit.   If you would like to schedule transportation through your [facility], please call the following number at least 2 days in advance of your appointment: [phone]   Rides for urgent appointments can also be made after hours by calling Member Services.  Call the Behavioral Health Crisis Line at [phone], at any time, 24 hours a day, 7 days a week. If you are in danger or need immediate medical attention call 911.   Richard Lopez - following are the goals we discussed in your visit today:   Goals Addressed             This Visit's Progress    VBCI RN Care Plan   On track    Problems:  Chronic Disease Management support and education needs related to HTN  Goal: Over the next 30 days the Patient will attend all scheduled medical appointments: GI 06/13/24 as evidenced by completed visit notes uploaded to EMR        demonstrate a decrease nausea/vomiting in exacerbations as evidenced by patient report of decreased symptoms, compliance with medications demonstrate Improved health management independence as evidenced by checking BP regularly at home and recording readings in a log        not experience hospital admission as evidenced by review of electronic medical record. Hospital Admissions in last 6 months = 4 work with Child psychotherapist to address SDOH needs/home improvement needs related to the management of chronic conditions as evidenced by review of electronic medical record and patient or social worker report     Continue to work on cutting back on alcohol, marijuana, and tobacco use  Interventions:   Evaluation of current treatment plan related to multiple hospitalizations within the past 6 months self-management and patient's adherence to plan as established by provider. Discussed  plans with patient for ongoing care management follow up and provided patient with direct contact information for care management team Evaluation of current treatment plan related to nausea/vomiting and patient's adherence to plan as established by provider Reviewed medications with patient and discussed importance of medication compliance Provided patient and/or caregiver with verbal information about [facility] (community resource) Reviewed scheduled/upcoming provider appointments including GI 06/13/24 Screening for signs and symptoms of depression related to chronic disease state  Assessed social determinant of health barriers Message sent to provider regarding issues with chronic pain and no orthopedic involvement Discussed elevated PHQ-9 and GAD-7 scores. Patient declines referral for counseling at this time  Hypertension Interventions: Last practice recorded BP readings:  BP Readings from Last 3 Encounters:  04/13/24 (!) 147/101  03/18/24 128/88  03/02/24 127/80   Most recent eGFR/CrCl: No results found for: EGFR  No components found for: CRCL  Evaluation of current treatment plan related to hypertension self management and patient's adherence to plan as established by provider Reviewed medications with patient and discussed importance of compliance Counseled on adverse effects of illicit drug and excessive alcohol use in patients with high blood pressure  Discussed plans with patient for ongoing care management follow up and provided patient with direct contact information for care management team Advised patient, providing education and rationale, to monitor blood pressure daily and record, calling PCP for findings outside established parameters  Patient Self-Care Activities:  Attend all scheduled provider appointments Call provider office for new concerns or questions  Take medications as prescribed   Work with the social worker to address care coordination needs and  will continue to work with the clinical team to address health care and disease management related needs check blood pressure 3 times per week keep a blood pressure log take blood pressure log to all doctor appointments call doctor for signs and symptoms of high blood pressure take medications for blood pressure exactly as prescribed  Plan:  Telephone follow up appointment with care management team member scheduled for:  06/17/24 at 1:30 PM             Please see education materials related to how to take your blood pressure provided by MyChart link.  Patient verbalizes understanding of instructions and care plan provided today and agrees to view in MyChart. Active MyChart status and patient understanding of how to access instructions and care plan via MyChart confirmed with patient.     RN Care Manager will message PCP regarding orthopedic referral Telephone follow up appointment with Managed Medicaid care management team member scheduled for: 06/17/24 at 1:30 PM  Rosaline Finlay, RN MSN [place]  VBCI Population Health RN Care Manager Direct Dial: [phone]  Fax: [phone]   Following is a copy of your plan of care:  There are no care plans that you recently modified to display for this patient.   How to Take Your Blood Pressure Blood pressure is a measurement of how strongly your blood is pressing against the walls of your arteries. Arteries are blood vessels that carry blood from your heart throughout your body. Your health care provider takes your blood pressure at each office visit. You can also take your own blood pressure at home with a blood pressure monitor. You may need to take your own blood pressure to: Confirm a diagnosis of high blood pressure (hypertension). Monitor your blood pressure over time. Make sure your blood pressure medicine is working. Supplies needed: Blood pressure monitor. A chair to sit in. This should be a chair where you can sit  upright with your back supported. Do not sit on a soft couch or an armchair. Table or desk. Small notebook and pencil or pen. How to prepare To get the most accurate reading, avoid the following for 30 minutes before you check your blood pressure: Drinking caffeine. Drinking alcohol. Eating. Smoking. Exercising. Five minutes before you check your blood pressure: Use the bathroom and urinate so that you have an empty bladder. Sit quietly in a chair. Do not talk. How to take your blood pressure To check your blood pressure, follow the instructions in the manual that came with your blood pressure monitor. If you have a digital blood pressure monitor, the instructions may be as follows: Sit up straight in a chair. Place your feet on the floor. Do not cross your ankles or legs. Rest your left arm at the level of your heart on a table or desk or on the arm of a chair. Pull up your shirt sleeve. Wrap the blood pressure cuff around the upper part of your left arm, 1 inch (2.5 cm) above your elbow. It is best to wrap the cuff around bare skin. Fit the cuff snugly, but not too tightly, around your arm. You should be able to place only one finger between the cuff and your arm. Position the cord so that it rests in the bend of your elbow. Press the power button. Sit quietly while the cuff inflates and deflates. Read the digital reading on  the monitor screen and write the numbers down (record them) in a notebook. Wait 2-3 minutes, then repeat the steps, starting at step 1. What does my blood pressure reading mean? A blood pressure reading consists of a higher number over a lower number. Ideally, your blood pressure should be below 120/80. The first (top) number is called the systolic pressure. It is a measure of the pressure in your arteries as your heart beats. The second (bottom) number is called the diastolic pressure. It is a measure of the pressure in your arteries as the heart relaxes. Blood  pressure is classified into four stages. The following are the stages for adults who do not have a short-term serious illness or a chronic condition. Systolic pressure and diastolic pressure are measured in a unit called mm Hg (millimeters of mercury).  Normal Systolic pressure: below 120. Diastolic pressure: below 80. Elevated Systolic pressure: 120-129. Diastolic pressure: below 80. Hypertension stage 1 Systolic pressure: 130-139. Diastolic pressure: 80-89. Hypertension stage 2 Systolic pressure: 140 or above. Diastolic pressure: 90 or above. You can have elevated blood pressure or hypertension even if only the systolic or only the diastolic number in your reading is higher than normal. Follow these instructions at home: Medicines Take over-the-counter and prescription medicines only as told by your health care provider. Tell your health care provider if you are having any side effects from blood pressure medicine. General instructions Check your blood pressure as often as recommended by your health care provider. Check your blood pressure at the same time every day. Take your monitor to the next appointment with your health care provider to make sure that: You are using it correctly. It provides accurate readings. Understand what your goal blood pressure numbers are. Keep all follow-up visits. This is important. General tips Your health care provider can suggest a reliable monitor that will meet your needs. There are several types of home blood pressure monitors. Choose a monitor that has an arm cuff. Do not choose a monitor that measures your blood pressure from your wrist or finger. Choose a cuff that wraps snugly, not too tight or too loose, around your upper arm. You should be able to fit only one finger between your arm and the cuff. You can buy a blood pressure monitor at most drugstores or online. Where to find more information American Heart Association:  www.heart.org Contact a health care provider if: Your blood pressure is consistently high. Your blood pressure is suddenly low. Get help right away if: Your systolic blood pressure is higher than 180. Your diastolic blood pressure is higher than 120. These symptoms may be an emergency. Get help right away. Call 911. Do not wait to see if the symptoms will go away. Do not drive yourself to the hospital. Summary Blood pressure is a measurement of how strongly your blood is pressing against the walls of your arteries. A blood pressure reading consists of a higher number over a lower number. Ideally, your blood pressure should be below 120/80. Check your blood pressure at the same time every day. Avoid caffeine, alcohol, smoking, and exercise for 30 minutes prior to checking your blood pressure. These agents can affect the accuracy of the blood pressure reading. This information is not intended to replace advice given to you by your health care provider. Make sure you discuss any questions you have with your health care provider. Document Revised: 07/04/2021 Document Reviewed: 07/04/2021 Elsevier Patient Education  2024 [employer].

## 2024-06-01 ENCOUNTER — Ambulatory Visit (INDEPENDENT_AMBULATORY_CARE_PROVIDER_SITE_OTHER): Admitting: Primary Care

## 2024-06-02 ENCOUNTER — Other Ambulatory Visit: Payer: Self-pay | Admitting: Primary Care

## 2024-06-02 DIAGNOSIS — M25551 Pain in right hip: Secondary | ICD-10-CM

## 2024-06-10 ENCOUNTER — Other Ambulatory Visit: Payer: Self-pay

## 2024-06-10 NOTE — Patient Outreach (Signed)
 Complex Care Management   Visit Note  06/10/2024  Name:  Richard Lopez MRN: [id number] DOB: 22-Mar-1976  Situation: Referral received for Complex Care Management related to SDOH Barriers:  assistance with home repairs I obtained verbal consent from Patient.  Visit completed with patient  on the phone  Background:   Past Medical History:  Diagnosis Date   Alcohol dependence with intoxication (HCC) 03/25/2019   Anxiety    Arthritis    hands and possibly knee   Atrial fibrillation with RVR (HCC) 04/22/2020   Closed right hip fracture, initial encounter (HCC) 03/25/2019   Depression    Diabetes mellitus    diet controlled   Esophagitis determined by endoscopy 08/21/2019   Essential hypertension    Gastritis    Gout    Intertrochanteric fracture of right hip (HCC) 04/14/2019   Normocytic anemia due to blood loss 08/21/2019   Pulmonary embolism (HCC)     Assessment: BSW held f/u appt with pt. Pt was cognitive and alert. Pt confirmed he received BSW resources for food and [employer] via email. Pt was advised over the phone of upcoming food distributions through out [employer] the [employer] mobile food market. Pt understood and states he will try to go get food. Patient states he is doing okay right now. Pt has not reached out to [facility] and DSS. Pt states he is having a endoscopy procedure on 08/11 and will call [facility] and DSS afterwards. No other resources were provided/requested at this time.    SDOH Interventions    Flowsheet Row Patient Outreach Telephone from 05/31/2024 in [place] POPULATION HEALTH DEPARTMENT Patient Outreach Telephone from 05/27/2024 in [place] POPULATION HEALTH DEPARTMENT Telephone from 04/14/2024 in [place] POPULATION HEALTH DEPARTMENT ED to Hosp-Admission (Discharged) from 04/10/2024 in [facility] Telephone from 03/21/2024 in [place] POPULATION HEALTH DEPARTMENT Care Coordination from 02/01/2024 in [facility] Community Care Coordination  SDOH Interventions        Food Insecurity Interventions Intervention Not Indicated Community Resources Provided  [employer] the [employer] mobile market.] Intervention Not Indicated -- Intervention Not Indicated Intervention Not Indicated  Housing Interventions Intervention Not Indicated Intervention Not Indicated Intervention Not Indicated -- Intervention Not Indicated --  Transportation Interventions Payor Benefit Payor Benefit  [plans to set up transportation for june 30 appts.] Payor Benefit  [Patient will schedule with medical transportation provided by [employer]] Inpatient TOC, Payor Benefit, Intervention Not Indicated Other (Comment), Payor Benefit  [Provided with transportation provided by [employer]] --  Utilities Interventions Intervention Not Indicated Intervention Not Indicated Intervention Not Indicated -- Intervention Not Indicated Intervention Not Indicated  Depression Interventions/Treatment  --  [facility] sent to PCP] -- -- -- -- --      Recommendation:   F/u with [facility] Michelle as scheduled after medical procedure. Reach out to [facility] and DSS as planned.   Follow Up Plan:   Telephone follow up appointment date/time:  06/24/2024 at 2:30pm   Laymon Doll, BSW [place]/VBCI - [facility] Behavioral Health Social Worker [phone]

## 2024-06-10 NOTE — Patient Instructions (Signed)
 Visit Information  [name] was given information about Medicaid Managed Care team care coordination services as a part of their [facility] Community Plan Medicaid benefit.   If you would like to schedule transportation through your [facility], please call the following number at least 2 days in advance of your appointment: [phone]   Rides for urgent appointments can also be made after hours by calling Member Services.  Call the Behavioral Health Crisis Line at [phone], at any time, 24 hours a day, 7 days a week. If you are in danger or need immediate medical attention call 911.   [name] - following are the goals we discussed in your visit today:   Goals Addressed   None     Patient verbalizes understanding of instructions and care plan provided today and agrees to view in MyChart. Active MyChart status and patient understanding of how to access instructions and care plan via MyChart confirmed with patient.     Telephone follow up appointment with Managed Medicaid care management team member scheduled for: 06/24/2024 at 2:30pm  Laymon Doll, [place]/VBCI - [facility] Social Worker [phone]   Following is a copy of your plan of care:  There are no care plans that you recently modified to display for this patient.

## 2024-06-17 ENCOUNTER — Other Ambulatory Visit: Payer: Self-pay

## 2024-06-17 NOTE — Patient Outreach (Signed)
 Complex Care Management   Visit Note  06/17/2024  Name:  Richard Lopez MRN: [id number] DOB: 20-Feb-1976  Situation: Referral received for Complex Care Management related to HTN I obtained verbal consent from Patient.  Visit completed with Richard Lopez  on the phone  Background:   Past Medical History:  Diagnosis Date   Alcohol dependence with intoxication (HCC) 03/25/2019   Anxiety    Arthritis    hands and possibly knee   Atrial fibrillation with RVR (HCC) 04/22/2020   Closed right hip fracture, initial encounter (HCC) 03/25/2019   Depression    Diabetes mellitus    diet controlled   Esophagitis determined by endoscopy 08/21/2019   Essential hypertension    Gastritis    Gout    Intertrochanteric fracture of right hip (HCC) 04/14/2019   Normocytic anemia due to blood loss 08/21/2019   Pulmonary embolism (HCC)     Assessment: Patient Reported Symptoms:  Cognitive Cognitive Status: Able to follow simple commands, Alert and oriented to person, place, and time, Normal speech and language skills Cognitive/Intellectual Conditions Management [RPT]: None reported or documented in medical history or problem list      Neurological Neurological Review of Symptoms: Not assessed    HEENT HEENT Symptoms Reported: Other:      Cardiovascular Cardiovascular Symptoms Reported: No symptoms reported Does patient have uncontrolled Hypertension?: Yes Is patient checking Blood Pressure at home?: Yes Patient's Recent BP reading at home: 138/70 Cardiovascular Management Strategies: Medication therapy  Respiratory Respiratory Symptoms Reported: No symptoms reported    Endocrine Endocrine Symptoms Reported: No symptoms reported Is patient diabetic?: Yes Is patient checking blood sugars at home?: No    Gastrointestinal Gastrointestinal Symptoms Reported: No symptoms reported Gastrointestinal Management Strategies: Medication therapy Gastrointestinal Comment: Patient completed EGD 06/13/24. He  reports they found a polyp but Dr. Saintclair did not seem to be concerned. He is expecting a follow-up call from GI in a couple of weeks. No medications were changed by GI.    Genitourinary Genitourinary Symptoms Reported: Not assessed    Integumentary Integumentary Symptoms Reported: Not assessed    Musculoskeletal Musculoskelatal Symptoms Reviewed: Joint pain Additional Musculoskeletal Details: Patient reports gout flare in bilateral knees and elbows. He attributes fare to recent EGD and not eating or drinking prior to it. He has previously had relief of gout symptoms with PO steroid. Not on gout prevention medications due to SE. Patient reports he has previously seen Dr. Lenor at [facility]. Offered to send PCP message requesting PO steroids, but patient declines. He states he would prefer to call rheumatology office to request. Provided with office phone number. Patient reports he has been using his cane since Wednesday. Musculoskeletal Management Strategies: Medication therapy, Medical device Musculoskeletal Comment: Note ortho referral was placed 06/02/24. Unable to see where referral was sent. Patient has not heard from office to schedule. Will reassess at next visit. Falls in the past year?: Yes Number of falls in past year: 1 or less Was there an injury with Fall?: No Fall Risk Category Calculator: 1 Patient Fall Risk Level: Low Fall Risk Patient at Risk for Falls Due to: History of fall(s), Impaired balance/gait Fall risk Follow up: Falls evaluation completed, Education provided  Psychosocial Psychosocial Symptoms Reported: Not assessed            05/31/2024    2:48 PM  Depression screen PHQ 2/9  Decreased Interest 3  Down, Depressed, Hopeless 3  PHQ - 2 Score 6  Altered sleeping 0  Tired, decreased energy 2  Change in appetite 1  Feeling bad or failure about yourself  1  Trouble concentrating 0  Moving slowly or fidgety/restless 0  Suicidal thoughts 0   PHQ-9 Score 10  Difficult doing work/chores Somewhat difficult    There were no vitals filed for this visit.  Medications Reviewed Today     Reviewed by Arno Rosaline SQUIBB, RN (Registered Nurse) on 06/17/24 at 1342  Med List Status: <None>   Medication Order Taking? Sig Documenting Provider Last Dose Status Informant  amLODipine  (NORVASC ) 10 MG tablet [id number] Yes Take 1 tablet (10 mg total) by mouth daily. Celestia Rosaline SQUIBB, NP  Active   Blood Glucose-BP Monitor (BLOOD GLUCOSE-WRIST BP MONITOR) DEVI [id number] Yes 1 Bag by Does not apply route in the morning, at noon, and at bedtime. Celestia Rosaline SQUIBB, NP  Active   Blood Pressure Monitoring (BLOOD PRESSURE MONITOR AUTOMAT) DEVI [id number] Yes 1 kit by Does not apply route in the morning, at noon, and at bedtime. Celestia Rosaline SQUIBB, NP  Active   Multiple Vitamin (MULTIVITAMIN WITH MINERALS) TABS tablet [id number]  Take 1 tablet by mouth daily.  Patient not taking: Reported on 06/17/2024   Celestia Rosaline SQUIBB, NP  Active Self  ondansetron  (ZOFRAN ) 4 MG tablet [id number] Yes Take 1 tablet (4 mg total) by mouth every 8 (eight) hours as needed for nausea or vomiting. Lue Elsie BROCKS, MD  Active Self  pantoprazole  (PROTONIX ) 40 MG tablet [id number] Yes Take 2 tablets (80 mg total) by mouth daily. Christobal Guadalajara, MD  Active   Potassium 99 MG TABS [id number] Yes Take 1 tablet by mouth daily. [provider]  Active Self  sucralfate  (CARAFATE ) 1 GM/10ML suspension [id number] Yes Take 10 mLs (1 g total) by mouth 4 (four) times daily -  with meals and at bedtime. Christobal Guadalajara, MD  Active   Med List Note Steffi Nian, [name] 03/16/24 1231): History of intermittent compliance             Recommendation:   Specialty provider follow-up Patient will call rheumatology to report gout symptoms Continue Current Plan of Care  Follow Up Plan:   Telephone follow up appointment date/time:  07/01/24 at 1 PM  Rosaline Arno, RN MSN [facility]  VBCI Population Health RN Care Manager Direct Dial: [phone]  Fax: [phone]

## 2024-06-17 NOTE — Patient Instructions (Signed)
 Visit Information  Mr. Parkerson was given information about Medicaid Managed Care team care coordination services as a part of their [facility] Community Plan Medicaid benefit.   If you would like to schedule transportation through your [facility], please call the following number at least 2 days in advance of your appointment: [phone]   Rides for urgent appointments can also be made after hours by calling Member Services.  Call the Behavioral Health Crisis Line at [phone], at any time, 24 hours a day, 7 days a week. If you are in danger or need immediate medical attention call 911.   Mr. Paredez - following are the goals we discussed in your visit today:   Goals Addressed             This Visit's Progress    VBCI RN Care Plan   On track    Problems:  Chronic Disease Management support and education needs related to HTN  Goal: Over the next 30 days the Patient will demonstrate a decrease nausea/vomiting in exacerbations as evidenced by patient report of decreased symptoms, compliance with medications demonstrate Ongoing health management independence as evidenced by checking BP regularly at home and recording readings in a log        not experience hospital admission as evidenced by review of electronic medical record. Hospital Admissions in last 6 months = 4 work with Child psychotherapist to address SDOH needs/home improvement needs related to the management of chronic conditions as evidenced by review of electronic medical record and patient or social worker report     Continue to work on cutting back on alcohol, marijuana, and tobacco use  Interventions:   Evaluation of current treatment plan related to multiple hospitalizations within the past 6 months self-management and patient's adherence to plan as established by provider. Discussed plans with patient for ongoing care management follow up and provided patient with direct contact information for  care management team Evaluation of current treatment plan related to nausea/vomiting and patient's adherence to plan as established by provider Reviewed medications with patient and discussed importance of medication compliance Ensured patient has planned follow-up with GI Discussed causes of gout flare-ups and prevention. Provided patient with rheumatology office phone number to report symptoms and request PO steroid. Offered to send message to PCP requesting steroids, patient declines  Hypertension Interventions: Last practice recorded BP readings:  BP Readings from Last 3 Encounters:  04/13/24 (!) 147/101  03/18/24 128/88  03/02/24 127/80   Most recent eGFR/CrCl: No results found for: EGFR  No components found for: CRCL  Evaluation of current treatment plan related to hypertension self management and patient's adherence to plan as established by provider Reviewed medications with patient and discussed importance of compliance Counseled on adverse effects of illicit drug and excessive alcohol use in patients with high blood pressure  Discussed plans with patient for ongoing care management follow up and provided patient with direct contact information for care management team Advised patient, providing education and rationale, to monitor blood pressure daily and record, calling PCP for findings outside established parameters  Patient Self-Care Activities:  Attend all scheduled provider appointments Call provider office for new concerns or questions  Take medications as prescribed   Work with the social worker to address care coordination needs and will continue to work with the clinical team to address health care and disease management related needs check blood pressure 3 times per week keep a blood pressure log take blood pressure log to  all doctor appointments call doctor for signs and symptoms of high blood pressure take medications for blood pressure exactly as  prescribed  Plan:  Telephone follow up appointment with care management team member scheduled for:  07/01/24 at 1 PM             Please see education materials related to Gout provided by MyChart link.  Patient verbalizes understanding of instructions and care plan provided today and agrees to view in MyChart. Active MyChart status and patient understanding of how to access instructions and care plan via MyChart confirmed with patient.     The Patient                                              will call Rheumatology office* as advised to report gout symptoms.  Telephone follow up appointment with Managed Medicaid care management team member scheduled for: 07/01/24 at 1 PM  Rosaline Finlay, RN MSN [place]  VBCI Population Health RN Care Manager Direct Dial: [phone]  Fax: [phone]   Following is a copy of your plan of care:  There are no care plans that you recently modified to display for this patient.   Gout  Gout is painful swelling of your joints. Gout is a type of arthritis. It is caused by having too much uric acid in your body. Uric acid is a chemical that is made when your body breaks down substances called purines. If your body has too much uric acid, sharp crystals can form and build up in your joints. This causes pain and swelling. Gout attacks can happen quickly and be very painful (acute gout). Over time, the attacks can affect more joints and happen more often (chronic gout). What are the causes? Gout is caused by too much uric acid in your blood. This can happen because: Your kidneys do not remove enough uric acid from your blood. Your body makes too much uric acid. You eat too many foods that are high in purines. These foods include organ meats, some seafood, and beer. Trauma or stress can bring on an attack. What increases the risk? Having a family history of gout. Being male and middle-aged. Being male and having gone through  menopause. Having an organ transplant. Taking certain medicines. Having certain conditions, such as: Being very overweight (obese). Lead poisoning. Kidney disease. A skin condition called psoriasis. Other risks include: Losing weight too quickly. Not having enough water in the body (being dehydrated). Drinking alcohol, especially beer. Drinking beverages that are sweetened with a type of sugar called fructose. What are the signs or symptoms? An attack of acute gout often starts at night and usually happens in just one joint. The most common place is the big toe. Other joints that may be affected include joints of the feet, ankle, knee, fingers, wrist, or elbow. Symptoms may include: Very bad pain. Warmth. Swelling. Stiffness. Tenderness. The affected joint may be very painful to touch. Shiny, red, or purple skin. Chills and fever. Chronic gout may cause symptoms more often. More joints may be involved. You may also have white or yellow lumps (tophi) on your hands or feet or in other areas near your joints. How is this treated? Treatment for an acute attack may include medicines for pain and swelling, such as: NSAIDs, such as ibuprofen . Steroids taken by mouth or injected into a joint. Colchicine .  This can be given by mouth or through an IV tube. Treatment to prevent future attacks may include: Taking small doses of NSAIDs or colchicine  daily. Using a medicine that reduces uric acid levels in your blood, such as allopurinol. Making changes to your diet. You may need to see a food expert (dietitian) about what to eat and drink to prevent gout. Follow these instructions at home: During a gout attack  If told, put ice on the painful area. To do this: Put ice in a plastic bag. Place a towel between your skin and the bag. Leave the ice on for 20 minutes, 2-3 times a day. Take off the ice if your skin turns bright red. This is very important. If you cannot feel pain, heat, or cold,  you have a greater risk of damage to the area. Raise the painful joint above the level of your heart as often as you can. Rest the joint as much as possible. If the joint is in your leg, you may be given crutches. Follow instructions from your doctor about what you cannot eat or drink. Avoiding future gout attacks Eat a low-purine diet. Avoid foods and drinks such as: Liver. Kidney. Anchovies. Asparagus. Herring. Mushrooms. Mussels. Beer. Stay at a healthy weight. If you want to lose weight, talk with your doctor. Do not lose weight too fast. Start or continue an exercise plan as told by your doctor. Eating and drinking Avoid drinks sweetened by fructose. Drink enough fluids to keep your pee (urine) pale yellow. If you drink alcohol: Limit how much you have to: 0-1 drink a day for women who are not pregnant. 0-2 drinks a day for men. Know how much alcohol is in a drink. In the [place] one drink equals one 12 oz bottle of beer (355 mL), one 5 oz glass of wine (148 mL), or one 1 oz glass of hard liquor (44 mL). General instructions Take over-the-counter and prescription medicines only as told by your doctor. Ask your doctor if you should avoid driving or using machines while you are taking your medicine. Return to your normal activities when your doctor says that it is safe. Keep all follow-up visits. Where to find more information [employer] of [employer]: www.niams.http://www.myers.net/ Contact a doctor if: You have another gout attack. You still have symptoms of a gout attack after 10 days of treatment. You have problems (side effects) because of your medicines. You have chills or a fever. You have burning pain when you pee (urinate). You have pain in your lower back or belly. Get help right away if: You have very bad pain. Your pain cannot be controlled. You cannot pee. Summary Gout is painful swelling of the joints. The most common site of pain is the big toe, but it can affect  other joints. Medicines and avoiding some foods can help to prevent and treat gout attacks. This information is not intended to replace advice given to you by your health care provider. Make sure you discuss any questions you have with your health care provider. Document Revised: 07/24/2021 Document Reviewed: 07/24/2021 Elsevier Patient Education  2024 [employer].

## 2024-06-24 ENCOUNTER — Other Ambulatory Visit: Payer: Self-pay

## 2024-06-24 NOTE — Patient Instructions (Signed)
 Visit Information  Richard Lopez was given information about Medicaid Managed Care team care coordination services as a part of their [facility] Community Plan Medicaid benefit.   If you would like to schedule transportation through your [facility], please call the following number at least 2 days in advance of your appointment: [phone]   Rides for urgent appointments can also be made after hours by calling Member Services.  Call the Behavioral Health Crisis Line at [phone], at any time, 24 hours a day, 7 days a week. If you are in danger or need immediate medical attention call 911.   Richard Lopez - following are the goals we discussed in your visit today:   Goals Addressed             This Visit's Progress    COMPLETED: BSW VBCI Social Work Care Plan   On track    Problems:   Assistance with housing: home repairs   CSW Clinical Goal(s):   Over the next 2 weeks the Patient will work with Child psychotherapist to address concerns related to housing: home repairs.  Interventions:  BSW will refer patient to [facility] for possible assistance.  Patient Goals/Self-Care Activities:  Reach out to [facility] to inquire about assistance.  Plan:   Telephone follow up appointment with care management team member scheduled for:  06/10/2024 at 2pm        Please see education materials related to community housing solutions provided by email.   Patient verbalizes understanding of instructions and care plan provided today and agrees to view in MyChart. Active MyChart status and patient understanding of how to access instructions and care plan via MyChart confirmed with patient.     No further follow up required at this time  Richard Lopez, [place]/VBCI - [facility] Social Worker [phone]   Following is a copy of your plan of care:  There are no care plans that you recently modified to display for this patient.

## 2024-06-24 NOTE — Patient Outreach (Signed)
 Complex Care Management   Visit Note  06/24/2024  Name:  Richard Lopez MRN: [id number] DOB: Mar 05, 1976  Situation: Referral received for Complex Care Management related to SDOH Barriers:  Housing home repairs I obtained verbal consent from Patient.  Visit completed with Patient  on the phone  Background:   Past Medical History:  Diagnosis Date   Alcohol dependence with intoxication (HCC) 03/25/2019   Anxiety    Arthritis    hands and possibly knee   Atrial fibrillation with RVR (HCC) 04/22/2020   Closed right hip fracture, initial encounter (HCC) 03/25/2019   Depression    Diabetes mellitus    diet controlled   Esophagitis determined by endoscopy 08/21/2019   Essential hypertension    Gastritis    Gout    Intertrochanteric fracture of right hip (HCC) 04/14/2019   Normocytic anemia due to blood loss 08/21/2019   Pulmonary embolism (HCC)     Assessment: BSW held f/u appt with pt. Pt was alert and cognitive. Pt reports his endoscopy procedure went well. Pt states he still has not reached out to [employer], but was provided their brochure and contact information via email again. Pt confirmed he is doing alright for now and did not need additional resources for SDOH needs. Pt was informed on how to get connected to BSW VBCI services again should his needs change in the future. No other resources provided at this time.   SDOH Interventions    Flowsheet Row Patient Outreach Telephone from 06/24/2024 in [place] POPULATION HEALTH DEPARTMENT Patient Outreach Telephone from 05/31/2024 in [place] POPULATION HEALTH DEPARTMENT Patient Outreach Telephone from 05/27/2024 in [place] POPULATION HEALTH DEPARTMENT Telephone from 04/14/2024 in [place] POPULATION HEALTH DEPARTMENT ED to Hosp-Admission (Discharged) from 04/10/2024 in [place] [facility] Telephone from 03/21/2024 in [place] POPULATION HEALTH DEPARTMENT  SDOH Interventions         Food Insecurity Interventions Intervention Not Indicated Intervention Not Indicated Community Resources Provided  wanetta out of the [employer] mobile market.] Intervention Not Indicated -- Intervention Not Indicated  Housing Interventions Intervention Not Indicated Intervention Not Indicated Intervention Not Indicated Intervention Not Indicated -- Intervention Not Indicated  Transportation Interventions Payor Benefit, Patient Resources (Friends/Family) Payor Benefit Payor Benefit  [plans to set up transportation for june 30 appts.] Payor Benefit  [Patient will schedule with medical transportation provided by [employer]] Inpatient TOC, Payor Benefit, Intervention Not Indicated Other (Comment), Payor Benefit  [Provided with transportation provided by [employer]]  Utilities Interventions -- Intervention Not Indicated Intervention Not Indicated Intervention Not Indicated -- Intervention Not Indicated  Depression Interventions/Treatment  -- --  [facility] sent to PCP] -- -- -- --  Financial Strain Interventions [employer] Provided -- -- -- -- --      Recommendation:   none  Follow Up Plan:   Patient has met all care management goals. Care Management case will be closed. Patient has been provided contact information should new needs arise.   Laymon Doll, BSW [place]/VBCI - [employer] Social Worker [phone]

## 2024-07-01 ENCOUNTER — Other Ambulatory Visit: Payer: Self-pay

## 2024-07-01 NOTE — Patient Outreach (Signed)
 Complex Care Management   Visit Note  07/01/2024  Name:  Richard Lopez MRN: [id number] DOB: [birth year]  Situation: Referral received for Complex Care Management related to HTN I obtained verbal consent from Patient.  Visit completed with Patient  on the phone  Background:   Past Medical History:  Diagnosis Date   Alcohol dependence with intoxication (HCC) 03/25/2019   Anxiety    Arthritis    hands and possibly knee   Atrial fibrillation with RVR (HCC) 04/22/2020   Closed right hip fracture, initial encounter (HCC) 03/25/2019   Depression    Diabetes mellitus    diet controlled   Esophagitis determined by endoscopy 08/21/2019   Essential hypertension    Gastritis    Gout    Intertrochanteric fracture of right hip (HCC) 04/14/2019   Normocytic anemia due to blood loss 08/21/2019   Pulmonary embolism (HCC)     Assessment: Patient Reported Symptoms:  Cognitive Cognitive Status: Able to follow simple commands, Alert and oriented to person, place, and time, Normal speech and language skills Cognitive/Intellectual Conditions Management [RPT]: None reported or documented in medical history or problem list      Neurological Neurological Review of Symptoms: No symptoms reported    HEENT HEENT Symptoms Reported: Other:      Cardiovascular Cardiovascular Symptoms Reported: No symptoms reported Does patient have uncontrolled Hypertension?: Yes Is patient checking Blood Pressure at home?: Yes Patient's Recent BP reading at home: 138/76 Cardiovascular Management Strategies: Medication therapy  Respiratory Respiratory Symptoms Reported: No symptoms reported Additional Respiratory Details: Patient reports he continues to smoke cigarettes every now and then but I really don't like them like I used to so not as much. He reports he continues to smoke marijuana about 4 times a week.    Endocrine Endocrine Symptoms Reported: No symptoms reported Is patient diabetic?: Yes Is patient  checking blood sugars at home?: No    Gastrointestinal Gastrointestinal Symptoms Reported: No symptoms reported Gastrointestinal Management Strategies: Medication therapy Gastrointestinal Comment: Patient reports he has not heard from GI yet following EGD. He is expecting a call next week.    Genitourinary Genitourinary Symptoms Reported: No symptoms reported    Integumentary Integumentary Symptoms Reported: No symptoms reported    Musculoskeletal Musculoskelatal Symptoms Reviewed: Joint pain Additional Musculoskeletal Details: Patient notes he did not call rheumatology for prednisone  as discussed at previous visit. He denies issues related to gout at this time. Musculoskeletal Management Strategies: Medical device, Medication therapy Musculoskeletal Comment: Patient notes he still has not heard reagarding orthopedic referral. Unable to see where referral was sent.      Psychosocial Psychosocial Symptoms Reported: No symptoms reported          07/01/2024    PHQ2-9 Depression Screening   Little interest or pleasure in doing things    Feeling down, depressed, or hopeless    PHQ-2 - Total Score    Trouble falling or staying asleep, or sleeping too much    Feeling tired or having little energy    Poor appetite or overeating     Feeling bad about yourself - or that you are a failure or have let yourself or your family down    Trouble concentrating on things, such as reading the newspaper or watching television    Moving or speaking so slowly that other people could have noticed.  Or the opposite - being so fidgety or restless that you have been moving around a lot more than usual    Thoughts that you  would be better off dead, or hurting yourself in some way    PHQ2-9 Total Score    If you checked off any problems, how difficult have these problems made it for you to do your work, take care of things at home, or get along with other people    Depression Interventions/Treatment       Vitals:   07/01/24 1417  BP: 138/76    Medications Reviewed Today     Reviewed by Arno Rosaline SQUIBB, RN (Registered Nurse) on 07/01/24 at 1324  Med List Status: <None>   Medication Order Taking? Sig Documenting Provider Last Dose Status Informant  amLODipine  (NORVASC ) 10 MG tablet [id number]  Take 1 tablet (10 mg total) by mouth daily. Celestia Rosaline SQUIBB, NP  Active   Blood Glucose-BP Monitor (BLOOD GLUCOSE-WRIST BP MONITOR) DEVI [id number]  1 Bag by Does not apply route in the morning, at noon, and at bedtime. Celestia Rosaline SQUIBB, NP  Active   Blood Pressure Monitoring (BLOOD PRESSURE MONITOR AUTOMAT) DEVI [id number]  1 kit by Does not apply route in the morning, at noon, and at bedtime. Celestia Rosaline SQUIBB, NP  Active   Multiple Vitamin (MULTIVITAMIN WITH MINERALS) TABS tablet [id number]  Take 1 tablet by mouth daily.  Patient not taking: Reported on 06/17/2024   Celestia Rosaline SQUIBB, NP  Active Self  ondansetron  (ZOFRAN ) 4 MG tablet [id number]  Take 1 tablet (4 mg total) by mouth every 8 (eight) hours as needed for nausea or vomiting. Lue Elsie BROCKS, MD  Active Self  pantoprazole  (PROTONIX ) 40 MG tablet [id number]  Take 2 tablets (80 mg total) by mouth daily. Christobal Guadalajara, MD  Expired 06/17/24 2359   Potassium 99 MG TABS [id number]  Take 1 tablet by mouth daily. [provider]  Active Self  sucralfate  (CARAFATE ) 1 GM/10ML suspension [id number]  Take 10 mLs (1 g total) by mouth 4 (four) times daily -  with meals and at bedtime. Christobal Guadalajara, MD  Expired 06/17/24 2359   Med List Note Steffi, [place], [name] 03/16/24 1231): History of intermittent compliance             Recommendation:   Continue Current Plan of Care  Follow Up Plan:   Closing From:  Complex Care Management Patient has met all care management goals. Care Management case will be closed. Patient has been provided contact information should new needs arise.   Rosaline Arno, RN MSN [place]   VBCI Population Health RN Care Manager Direct Dial: [phone]  Fax: [phone]

## 2024-07-01 NOTE — Patient Instructions (Signed)
 Visit Information  [name] was given information about Medicaid Managed Care team care coordination services as a part of their [facility] Community Plan Medicaid benefit.   If you would like to schedule transportation through your [facility], please call the following number at least 2 days in advance of your appointment: [phone]   Rides for urgent appointments can also be made after hours by calling Member Services.  Call the Behavioral Health Crisis Line at [phone], at any time, 24 hours a day, 7 days a week. If you are in danger or need immediate medical attention call 911.   [name] - following are the goals we discussed in your visit today:   Goals Addressed             This Visit's Progress    COMPLETED: VBCI RN Care Plan   On track    Problems:  Chronic Disease Management support and education needs related to HTN  Goal: Over the next 30 days the Patient will demonstrate Ongoing health management independence as evidenced by checking BP regularly at home and recording readings in a log        not experience hospital admission as evidenced by review of electronic medical record. Hospital Admissions in last 6 months = 2 Continue to work on cutting back on alcohol, marijuana, and tobacco use as evidenced by Continue to work on cutting back on alcohol, marijuana, and tobacco use  Interventions:   Evaluation of current treatment plan related to HTN self-management and patient's adherence to plan as established by provider. Discussed plans with patient for ongoing care management follow up and provided patient with direct contact information for care management team Evaluation of current treatment plan related to nausea/vomiting and patient's adherence to plan as established by provider Reviewed medications with patient and discussed importance of medication compliance Message sent to referral coordinator regarding orthopedic  referral   Hypertension Interventions: Last practice recorded BP readings:  BP Readings from Last 3 Encounters:  07/01/24 138/76  04/13/24 (!) 147/101  03/18/24 128/88   Most recent eGFR/CrCl: No results found for: EGFR  No components found for: CRCL  Evaluation of current treatment plan related to hypertension self management and patient's adherence to plan as established by provider Reviewed medications with patient and discussed importance of compliance Counseled on adverse effects of illicit drug and excessive alcohol use in patients with high blood pressure  Discussed plans with patient for ongoing care management follow up and provided patient with direct contact information for care management team Advised patient, providing education and rationale, to monitor blood pressure daily and record, calling PCP for findings outside established parameters  Patient Self-Care Activities:  Attend all scheduled provider appointments Call provider office for new concerns or questions  Take medications as prescribed   Work with the social worker to address care coordination needs and will continue to work with the clinical team to address health care and disease management related needs check blood pressure 3 times per week keep a blood pressure log take blood pressure log to all doctor appointments call doctor for signs and symptoms of high blood pressure take medications for blood pressure exactly as prescribed  Plan:  No further follow up required: Patient has met care plan goals             Patient verbalizes understanding of instructions and care plan provided today and agrees to view in MyChart. Active MyChart status and patient understanding of how  to access instructions and care plan via MyChart confirmed with patient.     RN Care Manager will contact PCP office regarding orthopedic referral No further follow up required: Patient has met care plan goals  Richard Finlay, RN MSN [place]  [facility] RN Care Manager Direct Dial: [phone]  Fax: [phone]  Following is a copy of your plan of care:  There are no care plans that you recently modified to display for this patient.

## 2024-08-16 ENCOUNTER — Encounter (HOSPITAL_COMMUNITY): Payer: Self-pay

## 2024-08-16 ENCOUNTER — Emergency Department (HOSPITAL_COMMUNITY)

## 2024-08-16 ENCOUNTER — Other Ambulatory Visit: Payer: Self-pay

## 2024-08-16 ENCOUNTER — Emergency Department (HOSPITAL_COMMUNITY)
Admission: EM | Admit: 2024-08-16 | Discharge: 2024-08-16 | Disposition: A | Discharge status: Home / Self Care | Attending: Emergency Medicine | Admitting: Emergency Medicine

## 2024-08-16 DIAGNOSIS — E119 Type 2 diabetes mellitus without complications: Secondary | ICD-10-CM | POA: Diagnosis not present

## 2024-08-16 DIAGNOSIS — R531 Weakness: Secondary | ICD-10-CM | POA: Insufficient documentation

## 2024-08-16 DIAGNOSIS — M545 Low back pain, unspecified: Secondary | ICD-10-CM | POA: Insufficient documentation

## 2024-08-16 LAB — URINALYSIS, ROUTINE W REFLEX MICROSCOPIC
Bilirubin Urine: NEGATIVE
Glucose, UA: NEGATIVE mg/dL
Hgb urine dipstick: NEGATIVE
Ketones, ur: 5 mg/dL — AB
Nitrite: NEGATIVE
Protein, ur: >=300 mg/dL — AB
Specific Gravity, Urine: 1.018 (ref 1.005–1.030)
pH: 5 (ref 5.0–8.0)

## 2024-08-16 LAB — COMPREHENSIVE METABOLIC PANEL WITH GFR
ALT: 41 U/L (ref 0–44)
AST: 79 U/L — ABNORMAL HIGH (ref 15–41)
Albumin: 4.2 g/dL (ref 3.5–5.0)
Alkaline Phosphatase: 88 U/L (ref 38–126)
Anion gap: 17 — ABNORMAL HIGH (ref 5–15)
BUN: 8 mg/dL (ref 6–20)
CO2: 25 mmol/L (ref 22–32)
Calcium: 9 mg/dL (ref 8.9–10.3)
Chloride: 95 mmol/L — ABNORMAL LOW (ref 98–111)
Creatinine, Ser: 0.89 mg/dL (ref 0.61–1.24)
GFR, Estimated: >60 mL/min (ref >60)
Glucose, Bld: 113 mg/dL — ABNORMAL HIGH (ref 70–99)
Potassium: 3.8 mmol/L (ref 3.5–5.1)
Sodium: 137 mmol/L (ref 135–145)
Total Bilirubin: 1.1 mg/dL (ref 0.0–1.2)
Total Protein: 7.5 g/dL (ref 6.5–8.1)

## 2024-08-16 LAB — CBC
HCT: 37.2 % — ABNORMAL LOW (ref 39.0–52.0)
Hemoglobin: 12.5 g/dL — ABNORMAL LOW (ref 13.0–17.0)
MCH: 28.5 pg (ref 26.0–34.0)
MCHC: 33.6 g/dL (ref 30.0–36.0)
MCV: 84.9 fL (ref 80.0–100.0)
Platelets: 89 K/uL — ABNORMAL LOW (ref 150–400)
RBC: 4.38 MIL/uL (ref 4.22–5.81)
RDW: 17.6 % — ABNORMAL HIGH (ref 11.5–15.5)
WBC: 4.2 K/uL (ref 4.0–10.5)
nRBC: 0 % (ref 0.0–0.2)

## 2024-08-16 LAB — CBG MONITORING, ED: Glucose-Capillary: 124 mg/dL — ABNORMAL HIGH (ref 70–99)

## 2024-08-16 MED ORDER — HYDROCODONE-ACETAMINOPHEN 5-325 MG PO TABS
1.0000 | ORAL_TABLET | Freq: Once | ORAL | Status: AC
  Administered 2024-08-16: 1 via ORAL
  Filled 2024-08-16: qty 1

## 2024-08-16 MED ORDER — PREDNISONE 10 MG PO TABS: 40.0000 mg | ORAL_TABLET | Freq: Every day | ORAL | 0 refills | Status: DC

## 2024-08-16 MED ORDER — PREDNISONE 20 MG PO TABS
60.0000 mg | ORAL_TABLET | Freq: Once | ORAL | Status: AC
  Administered 2024-08-16: 60 mg via ORAL
  Filled 2024-08-16: qty 3

## 2024-08-16 MED ORDER — SODIUM CHLORIDE 0.9 % IV BOLUS: 1000.0000 mL | Freq: Once | INTRAVENOUS | Status: DC

## 2024-08-16 NOTE — ED Triage Notes (Signed)
 Pt reports with extremity weakness since Sunday. Pt states that he became dizzy and his legs felt weird.

## 2024-08-16 NOTE — Discharge Instructions (Addendum)
 Evaluation today was overall reassuring.  There was some degenerative disc disease in the lumbar spine which is in the region of the lower back.  This could be contributing to your symptoms.  I am starting on prednisone  for the next 5 days.  Please follow-up with neurosurgery.  If you develop saddle numbness, urinary or bowel retention or incontinence, fever or any new trauma to back please return to the ED for further evaluation.  Also please follow-up with your PCP.

## 2024-08-16 NOTE — ED Provider Notes (Addendum)
 [place] EMERGENCY DEPARTMENT AT [facility] Provider Note   CSN: [id number] Arrival date & time: 08/16/24  1548     Patient presents with: Extremity Weakness  HPI Richard Lopez is a 48 y.o. male with history of A-fib with RVR, rheumatoid arthritis, type 2 diabetes, frequent marijuana use, alcohol dependence, history of PE presenting for lower extremity weakness and lower back pain.  He states that yesterday he started to have pain in his central lower back followed by weakness in both legs.  He states then that all of a sudden both legs gave out he fell backwards landing on his buttock.  Pain in the lower back was worse after the fall.  Still able to ambulate and bear weight with his cane that he normally uses.  He denies any abdominal pain, chest pain or shortness of breath.  He states the weakness in both his legs has improved overall since yesterday.  Also mentioned that he vomited once.  He states he last used marijuana on this past Sunday.  Denies saddle anesthesia, IV drug use, urinary or bowel changes or fever.    Extremity Weakness       Prior to Admission medications   Medication Sig Start Date End Date Taking? Authorizing Provider  predniSONE  (DELTASONE ) 10 MG tablet Take 4 tablets (40 mg total) by mouth daily for 4 days. 08/16/24 08/20/24 Yes Lang Norleen POUR, PA-C  amLODipine  (NORVASC ) 10 MG tablet Take 1 tablet (10 mg total) by mouth daily. 05/16/24 08/14/24  Celestia Rosaline SQUIBB, NP  Blood Glucose-BP Monitor (BLOOD GLUCOSE-WRIST BP MONITOR) DEVI 1 Bag by Does not apply route in the morning, at noon, and at bedtime. 04/24/24   Celestia Rosaline SQUIBB, NP  Blood Pressure Monitoring (BLOOD PRESSURE MONITOR AUTOMAT) DEVI 1 kit by Does not apply route in the morning, at noon, and at bedtime. 05/16/24   Celestia Rosaline SQUIBB, NP  Multiple Vitamin (MULTIVITAMIN WITH MINERALS) TABS tablet Take 1 tablet by mouth daily. Patient not taking: Reported on 06/17/2024 03/02/24    Celestia Rosaline SQUIBB, NP  ondansetron  (ZOFRAN ) 4 MG tablet Take 1 tablet (4 mg total) by mouth every 8 (eight) hours as needed for nausea or vomiting. 03/18/24   Lue Elsie BROCKS, MD  pantoprazole  (PROTONIX ) 40 MG tablet Take 2 tablets (80 mg total) by mouth daily. 04/13/24 06/17/24  Christobal Guadalajara, MD  Potassium 99 MG TABS Take 1 tablet by mouth daily.    [provider]  sucralfate  (CARAFATE ) 1 GM/10ML suspension Take 10 mLs (1 g total) by mouth 4 (four) times daily -  with meals and at bedtime. 04/13/24 06/17/24  Christobal Guadalajara, MD    Allergies: Ativan  [lorazepam ] and Nsaids    Review of Systems  Musculoskeletal:  Positive for extremity weakness.     Physical Exam   Vitals:   08/16/24 1601 08/16/24 1824  BP: 120/84 123/83  Pulse: (!) 102 78  Resp: 20 18  Temp: 98.6 F (37 C) 97.9 F (36.6 C)  SpO2: 100% 98%    CONSTITUTIONAL:  well-appearing, NAD NEURO:  GCS 15. Speech is goal oriented. No deficits appreciated to CN III-XII; symmetric eyebrow raise, no facial drooping, tongue midline. Patient has equal grip strength bilaterally with 4/5 strength against resistance in all major muscle groups bilaterally. Sensation to light touch intact. Patient moves extremities without ataxia. Normal finger-nose-finger. Patient ambulatory with steady gait with walking cane. EYES:  eyes equal and reactive ENT/NECK:  Supple, no stridor  CARDIO:  regular rate  and rhythm, appears well-perfused  PULM:  No respiratory distress, CTAB GI/GU:  non-distended, soft nontender MSK/SPINE:  No gross deformities, no edema, moves all extremities, some general tenderness of the lower back with palpation.  Able to actively flex and extend the back SKIN:  no rash, atraumatic   *Additional and/or pertinent findings included in MDM below     (all labs ordered are listed, but only abnormal results are displayed) Labs Reviewed  COMPREHENSIVE METABOLIC PANEL WITH GFR - Abnormal; Notable for the following  components:      Result Value   Chloride 95 (*)    Glucose, Bld 113 (*)    AST 79 (*)    Anion gap 17 (*)    All other components within normal limits  CBC - Abnormal; Notable for the following components:   Hemoglobin 12.5 (*)    HCT 37.2 (*)    RDW 17.6 (*)    Platelets 89 (*)    All other components within normal limits  URINALYSIS, ROUTINE W REFLEX MICROSCOPIC - Abnormal; Notable for the following components:   Color, Urine AMBER (*)    APPearance HAZY (*)    Ketones, ur 5 (*)    Protein, ur >=300 (*)    Leukocytes,Ua TRACE (*)    Bacteria, UA RARE (*)    All other components within normal limits  CBG MONITORING, ED - Abnormal; Notable for the following components:   Glucose-Capillary 124 (*)    All other components within normal limits    EKG: None  Radiology: CT Lumbar Spine Wo Contrast Result Date: 08/16/2024 CLINICAL DATA:  Back trauma, no prior imaging (Age >= 16y). Weakness. EXAM: CT LUMBAR SPINE WITHOUT CONTRAST TECHNIQUE: Multidetector CT imaging of the lumbar spine was performed without intravenous contrast administration. Multiplanar CT image reconstructions were also generated. RADIATION DOSE REDUCTION: This exam was performed according to the departmental dose-optimization program which includes automated exposure control, adjustment of the mA and/or kV according to patient size and/or use of iterative reconstruction technique. COMPARISON:  None Available. FINDINGS: Segmentation: 5 lumbar type vertebrae. Alignment: Normal. Vertebrae: No fracture or focal bone lesion. Paraspinal and other soft tissues: Negative Disc levels: Diffuse degenerative spurring anteriorly and laterally. Bilateral degenerative facet disease, most pronounced in the lower lumbar spine. Disc spaces maintained. Broad-based disc bulges at L3-4 through L5-S1. Mild central canal stenosis at L3-4 and L4-5 due to disc bulges and facet disease. IMPRESSION: No acute bony abnormality. Degenerative disc and  facet disease with broad-based disc bulges and central spinal stenosis as described above. Electronically Signed   By: Franky Crease M.D.   On: 08/16/2024 18:18   DG Pelvis Portable Result Date: 08/16/2024 CLINICAL DATA:  Lower right-sided to middle of the back pain status post recent fall. EXAM: PORTABLE PELVIS 1-2 VIEWS COMPARISON:  May 16, 2019 FINDINGS: There is no evidence of an acute pelvic fracture or diastasis. A radiopaque intramedullary rod and compression screw device is seen within the proximal right femur. Chronic deformities of the greater and lesser trochanters of the proximal right femur are also noted. These are present on the prior study. No pelvic bone lesions are seen. IMPRESSION: 1. No acute pelvic fracture or diastasis. 2. Prior ORIF of the proximal right femur. Electronically Signed   By: Suzen Dials M.D.   On: 08/16/2024 17:56     Procedures   Medications Ordered in the ED  HYDROcodone -acetaminophen  (NORCO/VICODIN) 5-325 MG per tablet 1 tablet (1 tablet Oral Given 08/16/24 1743)  predniSONE  (DELTASONE )  tablet 60 mg (60 mg Oral Given 08/16/24 1837)                                    Medical Decision Making Amount and/or Complexity of Data Reviewed Labs: ordered. Radiology: ordered.  Risk Prescription drug management.   48 year old well-appearing male presenting for lower extremity weakness and back pain.  Exam notable for some tenderness in the lower back with palpation and symmetric strength in lower extremities.  He was able to ambulate and bear weight with his walking cane which he normally uses.  Labs were unremarkable.  Urinalysis showed some trace leukocytes and rare bacteria but patient does not have any urinary symptoms at this time so elected not to treat for now.  CT scan and pelvic x-ray were nonacute.  There was degenerative disc and facet disease with broad-based disc bulges and central spinal stenosis.  Patient cannot take NSAIDs as.  Started him  on a 5-day course of prednisone . Considered cauda equina syndrome but unlikely given lack of red flag symptoms.  Also considered spinal epidural abscess but unlikely as well given reassuring CT scan and lack of red flag symptoms as well.  Doubt intra-abdominal pathology given benign abdominal exam and reassuring labs.  Also considered stroke but unlikely given symmetric weakness yeah which has improved since yesterday.   Advised him to follow-up with neurosurgery and PCP.  Discussed return precautions.  Discharged.     Final diagnoses:  Weakness  Low back pain, unspecified back pain laterality, unspecified chronicity, unspecified whether sciatica present    ED Discharge Orders          Ordered    predniSONE  (DELTASONE ) 10 MG tablet  Daily        08/16/24 1853              Lang Norleen POUR, PA-C 08/16/24 1853    Lenor Hollering, MD 08/16/24 2340

## 2024-08-16 NOTE — ED Notes (Signed)
ED PA at BS 

## 2024-08-20 ENCOUNTER — Inpatient Hospital Stay (HOSPITAL_COMMUNITY)
Admission: EM | Admit: 2024-08-20 | Discharge: 2024-08-22 | DRG: 683 | Disposition: A | Discharge status: Home / Self Care | Attending: Student | Admitting: Student

## 2024-08-20 ENCOUNTER — Emergency Department (HOSPITAL_COMMUNITY)

## 2024-08-20 DIAGNOSIS — E1169 Type 2 diabetes mellitus with other specified complication: Secondary | ICD-10-CM | POA: Diagnosis present

## 2024-08-20 DIAGNOSIS — Z833 Family history of diabetes mellitus: Secondary | ICD-10-CM

## 2024-08-20 DIAGNOSIS — R112 Nausea with vomiting, unspecified: Secondary | ICD-10-CM | POA: Diagnosis present

## 2024-08-20 DIAGNOSIS — D509 Iron deficiency anemia, unspecified: Secondary | ICD-10-CM | POA: Diagnosis present

## 2024-08-20 DIAGNOSIS — E860 Dehydration: Secondary | ICD-10-CM | POA: Diagnosis present

## 2024-08-20 DIAGNOSIS — Z888 Allergy status to other drugs, medicaments and biological substances status: Secondary | ICD-10-CM

## 2024-08-20 DIAGNOSIS — Z87891 Personal history of nicotine dependence: Secondary | ICD-10-CM | POA: Diagnosis not present

## 2024-08-20 DIAGNOSIS — Z886 Allergy status to analgesic agent status: Secondary | ICD-10-CM | POA: Diagnosis not present

## 2024-08-20 DIAGNOSIS — E119 Type 2 diabetes mellitus without complications: Secondary | ICD-10-CM | POA: Diagnosis present

## 2024-08-20 DIAGNOSIS — E861 Hypovolemia: Secondary | ICD-10-CM | POA: Diagnosis present

## 2024-08-20 DIAGNOSIS — F1490 Cocaine use, unspecified, uncomplicated: Secondary | ICD-10-CM | POA: Diagnosis present

## 2024-08-20 DIAGNOSIS — R1116 Cannabis hyperemesis syndrome: Secondary | ICD-10-CM | POA: Diagnosis present

## 2024-08-20 DIAGNOSIS — I480 Paroxysmal atrial fibrillation: Secondary | ICD-10-CM | POA: Diagnosis present

## 2024-08-20 DIAGNOSIS — R7303 Prediabetes: Secondary | ICD-10-CM | POA: Diagnosis present

## 2024-08-20 DIAGNOSIS — F1010 Alcohol abuse, uncomplicated: Secondary | ICD-10-CM | POA: Diagnosis present

## 2024-08-20 DIAGNOSIS — F1290 Cannabis use, unspecified, uncomplicated: Secondary | ICD-10-CM | POA: Diagnosis present

## 2024-08-20 DIAGNOSIS — E876 Hypokalemia: Secondary | ICD-10-CM | POA: Diagnosis present

## 2024-08-20 DIAGNOSIS — Z86711 Personal history of pulmonary embolism: Secondary | ICD-10-CM

## 2024-08-20 DIAGNOSIS — I10 Essential (primary) hypertension: Secondary | ICD-10-CM | POA: Diagnosis present

## 2024-08-20 DIAGNOSIS — R7401 Elevation of levels of liver transaminase levels: Secondary | ICD-10-CM | POA: Diagnosis present

## 2024-08-20 DIAGNOSIS — N179 Acute kidney failure, unspecified: Principal | ICD-10-CM | POA: Diagnosis present

## 2024-08-20 DIAGNOSIS — Z8249 Family history of ischemic heart disease and other diseases of the circulatory system: Secondary | ICD-10-CM | POA: Diagnosis not present

## 2024-08-20 DIAGNOSIS — E871 Hypo-osmolality and hyponatremia: Secondary | ICD-10-CM | POA: Diagnosis present

## 2024-08-20 DIAGNOSIS — Z79899 Other long term (current) drug therapy: Secondary | ICD-10-CM

## 2024-08-20 LAB — COMPREHENSIVE METABOLIC PANEL WITH GFR
ALT: 107 U/L — ABNORMAL HIGH (ref 0–44)
AST: 151 U/L — ABNORMAL HIGH (ref 15–41)
Albumin: 5.3 g/dL — ABNORMAL HIGH (ref 3.5–5.0)
Alkaline Phosphatase: 85 U/L (ref 38–126)
Anion gap: 28 — ABNORMAL HIGH (ref 5–15)
BUN: 24 mg/dL — ABNORMAL HIGH (ref 6–20)
CO2: 23 mmol/L (ref 22–32)
Calcium: 10.4 mg/dL — ABNORMAL HIGH (ref 8.9–10.3)
Chloride: 81 mmol/L — ABNORMAL LOW (ref 98–111)
Creatinine, Ser: 3.25 mg/dL — ABNORMAL HIGH (ref 0.61–1.24)
GFR, Estimated: 23 mL/min — ABNORMAL LOW (ref >60)
Glucose, Bld: 280 mg/dL — ABNORMAL HIGH (ref 70–99)
Potassium: 3.6 mmol/L (ref 3.5–5.1)
Sodium: 131 mmol/L — ABNORMAL LOW (ref 135–145)
Total Bilirubin: 1 mg/dL (ref 0.0–1.2)
Total Protein: 9.6 g/dL — ABNORMAL HIGH (ref 6.5–8.1)

## 2024-08-20 LAB — CBC
HCT: 39.5 % (ref 39.0–52.0)
Hemoglobin: 13.9 g/dL (ref 13.0–17.0)
MCH: 29.7 pg (ref 26.0–34.0)
MCHC: 35.2 g/dL (ref 30.0–36.0)
MCV: 84.4 fL (ref 80.0–100.0)
Platelets: 245 K/uL (ref 150–400)
RBC: 4.68 MIL/uL (ref 4.22–5.81)
RDW: 16.7 % — ABNORMAL HIGH (ref 11.5–15.5)
WBC: 6.8 K/uL (ref 4.0–10.5)
nRBC: 0 % (ref 0.0–0.2)

## 2024-08-20 LAB — ETHANOL: Alcohol, Ethyl (B): <15 mg/dL (ref <15)

## 2024-08-20 LAB — LIPASE, BLOOD: Lipase: 105 U/L — ABNORMAL HIGH (ref 11–51)

## 2024-08-20 MED ORDER — LACTATED RINGERS IV SOLN: INTRAVENOUS | Status: AC

## 2024-08-20 MED ORDER — LACTATED RINGERS IV BOLUS
1000.0000 mL | Freq: Once | INTRAVENOUS | Status: AC
  Administered 2024-08-20: 1000 mL via INTRAVENOUS

## 2024-08-20 MED ORDER — ENOXAPARIN SODIUM 40 MG/0.4ML IJ SOSY: 40.0000 mg | PREFILLED_SYRINGE | INTRAMUSCULAR | Status: DC

## 2024-08-20 MED ORDER — PROCHLORPERAZINE EDISYLATE 10 MG/2ML IJ SOLN
10.0000 mg | INTRAMUSCULAR | Status: DC | PRN
  Administered 2024-08-20 – 2024-08-21 (×2): 10 mg via INTRAVENOUS
  Filled 2024-08-20 (×2): qty 2

## 2024-08-20 MED ORDER — FOLIC ACID 1 MG PO TABS
1.0000 mg | ORAL_TABLET | Freq: Every day | ORAL | Status: DC
  Administered 2024-08-20 – 2024-08-22 (×3): 1 mg via ORAL
  Filled 2024-08-20 (×3): qty 1

## 2024-08-20 MED ORDER — PANTOPRAZOLE SODIUM 40 MG IV SOLR
40.0000 mg | Freq: Every day | INTRAVENOUS | Status: DC
  Administered 2024-08-20 – 2024-08-21 (×2): 40 mg via INTRAVENOUS
  Filled 2024-08-20 (×2): qty 10

## 2024-08-20 MED ORDER — ENOXAPARIN SODIUM 30 MG/0.3ML IJ SOSY
30.0000 mg | PREFILLED_SYRINGE | INTRAMUSCULAR | Status: DC
  Administered 2024-08-21: 30 mg via SUBCUTANEOUS
  Filled 2024-08-20: qty 0.3

## 2024-08-20 MED ORDER — ADULT MULTIVITAMIN W/MINERALS CH
1.0000 | ORAL_TABLET | Freq: Every day | ORAL | Status: DC
  Administered 2024-08-20 – 2024-08-22 (×3): 1 via ORAL
  Filled 2024-08-20 (×3): qty 1

## 2024-08-20 MED ORDER — THIAMINE HCL 100 MG/ML IJ SOLN
100.0000 mg | Freq: Every day | INTRAMUSCULAR | Status: DC
  Administered 2024-08-20 – 2024-08-21 (×2): 100 mg via INTRAVENOUS
  Filled 2024-08-20 (×2): qty 2

## 2024-08-20 MED ORDER — MELATONIN 3 MG PO TABS
3.0000 mg | ORAL_TABLET | Freq: Every evening | ORAL | Status: DC | PRN
  Administered 2024-08-20 – 2024-08-21 (×2): 3 mg via ORAL
  Filled 2024-08-20 (×2): qty 1

## 2024-08-20 MED ORDER — BISACODYL 5 MG PO TBEC
5.0000 mg | DELAYED_RELEASE_TABLET | Freq: Every day | ORAL | Status: DC | PRN
  Administered 2024-08-20: 5 mg via ORAL
  Filled 2024-08-20: qty 1

## 2024-08-20 MED ORDER — SODIUM CHLORIDE 0.9 % IV BOLUS
1000.0000 mL | Freq: Once | INTRAVENOUS | Status: AC
Start: 2024-08-20 — End: 2024-08-21
  Administered 2024-08-20: 1000 mL via INTRAVENOUS

## 2024-08-20 NOTE — ED Provider Notes (Signed)
 [place] EMERGENCY DEPARTMENT AT [facility] Provider Note   CSN: [id number] Arrival date & time: 08/20/24  1348     Patient presents with: Nausea and Emesis   Richard Lopez is a 48 y.o. male.hx of gastritis and esophagitis, esophageal ulcers, suspected cannabis hyperemesis, with continued THC use, alcohol abuse, gallstones, hypertension, prediabetes, fatty liver, paroxysmal Afib, history of PE completed anticoagulation, mood disorde   {Add pertinent medical, surgical, social history, OB history to YEP:67052} HPI Patient reports he has had a couple of episodes of vomiting over the past couple of days.  He is trying to hydrate and taking extra fluids but he reports he still think he is dehydrated.  Patient reports he is gotten lightheaded and almost passed out a couple times.  He reports that he does not think he is having a bleeding right now.  Reports that the vomit did not have any blood in it.  Reports he has kind of constant achy discomfort in his central abdomen with nausea.  Reports has been hard for him to keep up with hydration.  Patient reports that he was treated in the emergency department but feels like he is still not able to take in enough fluids and food orally.    Prior to Admission medications   Medication Sig Start Date End Date Taking? Authorizing Provider  amLODipine  (NORVASC ) 10 MG tablet Take 1 tablet (10 mg total) by mouth daily. 05/16/24 08/14/24  Celestia Rosaline SQUIBB, NP  Blood Glucose-BP Monitor (BLOOD GLUCOSE-WRIST BP MONITOR) DEVI 1 Bag by Does not apply route in the morning, at noon, and at bedtime. 04/24/24   Celestia Rosaline SQUIBB, NP  Blood Pressure Monitoring (BLOOD PRESSURE MONITOR AUTOMAT) DEVI 1 kit by Does not apply route in the morning, at noon, and at bedtime. 05/16/24   Celestia Rosaline SQUIBB, NP  Multiple Vitamin (MULTIVITAMIN WITH MINERALS) TABS tablet Take 1 tablet by mouth daily. Patient not taking: Reported on 06/17/2024 03/02/24   Celestia Rosaline SQUIBB, NP  ondansetron  (ZOFRAN ) 4 MG tablet Take 1 tablet (4 mg total) by mouth every 8 (eight) hours as needed for nausea or vomiting. 03/18/24   Lue Elsie BROCKS, MD  pantoprazole  (PROTONIX ) 40 MG tablet Take 2 tablets (80 mg total) by mouth daily. 04/13/24 06/17/24  Christobal Guadalajara, MD  Potassium 99 MG TABS Take 1 tablet by mouth daily.    [provider]  predniSONE  (DELTASONE ) 10 MG tablet Take 4 tablets (40 mg total) by mouth daily for 4 days. 08/16/24 08/20/24  Robinson, John K, PA-C  sucralfate  (CARAFATE ) 1 GM/10ML suspension Take 10 mLs (1 g total) by mouth 4 (four) times daily -  with meals and at bedtime. 04/13/24 06/17/24  Christobal Guadalajara, MD    Allergies: Ativan  [lorazepam ] and Nsaids    Review of Systems  Updated Vital Signs BP 124/76 (BP Location: Right Arm)   Pulse 79   Temp 98.3 F (36.8 C) (Oral)   Resp 17   SpO2 98%   Physical Exam Constitutional:      Comments: Patient is alert and nontoxic.  No respiratory distress.  Mental status clear.  HENT:     Mouth/Throat:     Pharynx: Oropharynx is clear.  Eyes:     Extraocular Movements: Extraocular movements intact.  Cardiovascular:     Rate and Rhythm: Normal rate and regular rhythm.  Pulmonary:     Effort: Pulmonary effort is normal.     Breath sounds: Normal breath sounds.  Abdominal:  Comments: Abdomen soft.  No localizing or significant reproducible tenderness to palpation.  Musculoskeletal:     Comments: No peripheral edema.  Calves are soft and pliable.  Patient has a large cystic nodules on multiple different joints of the hands from rheumatoid arthritis per his report.  None of these appear infected.  Appears very chronic.  Hypertrophic knees but no effusions or erythema.  Skin:    General: Skin is warm and dry.  Neurological:     General: No focal deficit present.     Mental Status: He is oriented to person, place, and time.     Comments: No focal weakness.  Patient is following commands  appropriately.  He can sit up at the stretcher and move and reposition at request.  At baseline he does often use a cane.  Psychiatric:        Mood and Affect: Mood normal.     (all labs ordered are listed, but only abnormal results are displayed) Labs Reviewed  LIPASE, BLOOD  COMPREHENSIVE METABOLIC PANEL WITH GFR  CBC  URINALYSIS, ROUTINE W REFLEX MICROSCOPIC    EKG: None  Radiology: No results found.  {Document cardiac monitor, telemetry assessment procedure when appropriate:32947} Procedures   Medications Ordered in the ED - No data to display    {Click here for ABCD2, HEART and other calculators REFRESH Note before signing:1}                              Medical Decision Making Amount and/or Complexity of Data Reviewed Labs: ordered.    From hospitalization 611\25 EGD 6/10>EGD showed severe esophagitis, esophageal ulcers, severe erythema in gastric cardia likely from retching. Gi Have taken biopsies from esophagus to rule out HSV/CMV, biopsies from stomach to rule out H. pylori, biopsies from small bowel to rule out celiac. He will need to be compliant with PPI twice a day for 2 months and sucralfate  4 times a day for 1 month. Biopsies can be followed as outpatient. I have advised him to stop marijuana use and alcohol us   {Document critical care time when appropriate  Document review of labs and clinical decision tools ie CHADS2VASC2, etc  Document your independent review of radiology images and any outside records  Document your discussion with family members, caretakers and with consultants  Document social determinants of health affecting pt's care  Document your decision making why or why not admission, treatments were needed:32947:::1}   Final diagnoses:  None    ED Discharge Orders     None

## 2024-08-20 NOTE — ED Triage Notes (Signed)
 Patient here from home reporting n/v x3 days. Hx of same.

## 2024-08-20 NOTE — H&P (Addendum)
 History and Physical    Patient: Richard Lopez FMW:[id number] DOB: [birth year] DOA: 08/20/2024 DOS: the patient was seen and examined on 08/20/2024 PCP: Celestia Rosaline SQUIBB, NP  Patient coming from: Home  Chief Complaint:  Chief Complaint  Patient presents with   Nausea   Emesis   HPI: Richard Lopez is a 48 y.o. male with medical history significant for cannabis abuse, alcohol abuse, esophageal ulcers and esophagitis, hypertension, and history of PE who presents because of progressive dizziness and weakness and intractable vomiting/dry heaving for the last 4 days.  The patient last used marijuana 7 days ago.  The patient was seen in the emergency department 4 days ago because he fell and he was feeling very dizzy.  He also complained of back pain.  His workup at that time included a CT of his L-spine.  He was found to have multiple bulging disks, no cord compression.  He was discharged from the emergency department and told to hydrate orally but the patient has not been able to keep down liquids.  Even when nothing comes up he is dry heaving almost continuously.  He does have some mild abdominal pain.  No diarrhea or fevers.  He recognizes that this could be because of his cannabis use 7 days ago that he has been unable to stop the dry heaving.  He he is extremely dizzy so he came back in for evaluation. In the emergency department he received 1 L of normal saline.  The hospitalist were called to admit him when his blood work revealed a creatinine of 3.25.  4 days ago his creatinine was 0.89 The patient has not been able to urinate to produce a urine sample. The patient said he had an EGD 3 months ago and it looked good.  He has not had this trouble with recurrent vomiting in the last 3 months.   Review of Systems: As mentioned in the history of present illness. All other systems reviewed and are negative. Past Medical History:  Diagnosis Date   Alcohol dependence with intoxication (HCC)  03/25/2019   Anxiety    Arthritis    hands and possibly knee   Atrial fibrillation with RVR (HCC) 04/22/2020   Closed right hip fracture, initial encounter (HCC) 03/25/2019   Depression    Diabetes mellitus    diet controlled   Esophagitis determined by endoscopy 08/21/2019   Essential hypertension    Gastritis    Gout    Intertrochanteric fracture of right hip (HCC) 04/14/2019   Normocytic anemia due to blood loss 08/21/2019   Pulmonary embolism [facility])    Past Surgical History:  Procedure Laterality Date   BIOPSY  11/12/2022   Procedure: BIOPSY;  Surgeon: Saintclair Jasper, MD;  Location: [facility];  Service: Gastroenterology;;   ESOPHAGOGASTRODUODENOSCOPY N/A 04/12/2024   Procedure: EGD (ESOPHAGOGASTRODUODENOSCOPY);  Surgeon: Saintclair Jasper, MD;  Location: [facility];  Service: Gastroenterology;  Laterality: N/A;   ESOPHAGOGASTRODUODENOSCOPY (EGD) WITH PROPOFOL  N/A 08/14/2019   Procedure: ESOPHAGOGASTRODUODENOSCOPY (EGD) WITH PROPOFOL ;  Surgeon: Dianna Specking, MD;  [facility]: [facility];  Service: Endoscopy;  Laterality: N/A;   ESOPHAGOGASTRODUODENOSCOPY (EGD) WITH PROPOFOL  N/A 11/12/2022   Procedure: ESOPHAGOGASTRODUODENOSCOPY (EGD) WITH PROPOFOL ;  Surgeon: Saintclair Jasper, MD;  [facility]: [facility];  Service: Gastroenterology;  Laterality: N/A;   EXTERNAL FIXATION LEG Left 11/07/2018   Procedure: EXTERNAL FIXATION LEFT LOWER LEG;  Surgeon: Fidel Rogue, MD;  Location: [facility];  Service: Orthopedics;  Laterality: Left;   EXTERNAL FIXATION REMOVAL Left 11/08/2018  Procedure: REMOVAL EXTERNAL FIXATION LEG;  Surgeon: Kendal Franky SQUIBB, MD;  Location: [facility];  Service: Orthopedics;  Laterality: Left;   INTRAMEDULLARY (IM) NAIL INTERTROCHANTERIC Right 03/26/2019   Procedure: INTRAMEDULLARY (IM) NAIL INTERTROCHANTRIC;  Surgeon: Kendal Franky SQUIBB, MD;  Location: [facility];  Service: Orthopedics;  Laterality: Right;   INTRAMEDULLARY (IM) NAIL INTERTROCHANTERIC Right 05/17/2019   Procedure: Intramedullary  (Im) Nail Intertroch with circlage wiring;  Surgeon: Celena Sharper, MD;  Location: [facility];  Service: Orthopedics;  Laterality: Right;   NO PAST SURGERIES     OPEN REDUCTION INTERNAL FIXATION (ORIF) TIBIA/FIBULA FRACTURE Left 11/08/2018   Procedure: OPEN REDUCTION INTERNAL FIXATION (ORIF) TIBIA/FIBULA FRACTURE;  Surgeon: Kendal Franky SQUIBB, MD;  Location: [facility];  Service: Orthopedics;  Laterality: Left;   ORIF FEMUR FRACTURE Right 05/17/2019   Procedure: REMOVAL  OF HARDWARE;  Surgeon: Celena Sharper, MD;  Location: [facility];  Service: Orthopedics;  Laterality: Right;   Social History:  reports that he has quit smoking. His smoking use included cigarettes. He has never used smokeless tobacco. He reports current alcohol use of about 200.0 standard drinks of alcohol per week. He reports current drug use. Drug: Marijuana.  Allergies  Allergen Reactions   Ativan  [Lorazepam ] Anxiety and Other (See Comments)    Hallucinations   Nsaids Other (See Comments)    Hx of esophageal ulcer    Family History  Problem Relation Age of Onset   Diabetes Mellitus II Father    Diabetes Mellitus II Other    CAD Other     Prior to Admission medications   Medication Sig Start Date End Date Taking? Authorizing Provider  amLODipine  (NORVASC ) 10 MG tablet Take 1 tablet (10 mg total) by mouth daily. 05/16/24 08/14/24  Celestia Rosaline SQUIBB, NP  Blood Glucose-BP Monitor (BLOOD GLUCOSE-WRIST BP MONITOR) DEVI 1 Bag by Does not apply route in the morning, at noon, and at bedtime. 04/24/24   Celestia Rosaline SQUIBB, NP  Blood Pressure Monitoring (BLOOD PRESSURE MONITOR AUTOMAT) DEVI 1 kit by Does not apply route in the morning, at noon, and at bedtime. 05/16/24   Celestia Rosaline SQUIBB, NP  Multiple Vitamin (MULTIVITAMIN WITH MINERALS) TABS tablet Take 1 tablet by mouth daily. Patient not taking: Reported on 06/17/2024 03/02/24   Celestia Rosaline SQUIBB, NP  ondansetron  (ZOFRAN ) 4 MG tablet Take 1 tablet (4 mg total) by mouth every 8 (eight)  hours as needed for nausea or vomiting. 03/18/24   Lue Elsie BROCKS, MD  pantoprazole  (PROTONIX ) 40 MG tablet Take 2 tablets (80 mg total) by mouth daily. 04/13/24 06/17/24  Christobal Guadalajara, MD  Potassium 99 MG TABS Take 1 tablet by mouth daily.    [provider]  predniSONE  (DELTASONE ) 10 MG tablet Take 4 tablets (40 mg total) by mouth daily for 4 days. 08/16/24 08/20/24  Robinson, John K, PA-C  sucralfate  (CARAFATE ) 1 GM/10ML suspension Take 10 mLs (1 g total) by mouth 4 (four) times daily -  with meals and at bedtime. 04/13/24 06/17/24  Christobal Guadalajara, MD    Physical Exam: Vitals:   08/20/24 1356  BP: 124/76  Pulse: 79  Resp: 17  Temp: 98.3 F (36.8 C)  TempSrc: Oral  SpO2: 98%   Physical Exam:  General: No acute distress, well developed, well nourished HEENT: Normocephalic, atraumatic, PERRL Cardiovascular: Normal rate and rhythm. Distal pulses intact. Pulmonary: Normal pulmonary effort, normal breath sounds Gastrointestinal: Nondistended abdomen, soft, tender in mid epigastric area, hypooactive bowel sounds Musculoskeletal:No lower ext edema  Skin: Skin  is warm and dry. Neuro: Strength testing deferred as the patient is heaving repeatedly AAOx3. PSYCH: Attentive and cooperative  Data Reviewed:  Results for orders placed or performed during the hospital encounter of 08/20/24 (from the past 24 hours)  Lipase, blood     Status: Abnormal   Collection Time: 08/20/24  2:32 PM  Result Value Ref Range   Lipase 105 (H) 11 - 51 U/L  Comprehensive metabolic panel     Status: Abnormal   Collection Time: 08/20/24  2:32 PM  Result Value Ref Range   Sodium 131 (L) 135 - 145 mmol/L   Potassium 3.6 3.5 - 5.1 mmol/L   Chloride 81 (L) 98 - 111 mmol/L   CO2 23 22 - 32 mmol/L   Glucose, Bld 280 (H) 70 - 99 mg/dL   BUN 24 (H) 6 - 20 mg/dL   Creatinine, Ser 6.74 (H) 0.61 - 1.24 mg/dL   Calcium  10.4 (H) 8.9 - 10.3 mg/dL   Total Protein 9.6 (H) 6.5 - 8.1 g/dL   Albumin  5.3 (H) 3.5 - 5.0  g/dL   AST 848 (H) 15 - 41 U/L   ALT 107 (H) 0 - 44 U/L   Alkaline Phosphatase 85 38 - 126 U/L   Total Bilirubin 1.0 0.0 - 1.2 mg/dL   GFR, Estimated 23 (L) >60 mL/min   Anion gap 28 (H) 5 - 15  CBC     Status: Abnormal   Collection Time: 08/20/24  2:32 PM  Result Value Ref Range   WBC 6.8 4.0 - 10.5 K/uL   RBC 4.68 4.22 - 5.81 MIL/uL   Hemoglobin 13.9 13.0 - 17.0 g/dL   HCT 60.4 60.9 - 47.9 %   MCV 84.4 80.0 - 100.0 fL   MCH 29.7 26.0 - 34.0 pg   MCHC 35.2 30.0 - 36.0 g/dL   RDW 83.2 (H) 88.4 - 84.4 %   Platelets 245 150 - 400 K/uL   nRBC 0.0 0.0 - 0.2 %     Assessment and Plan: Acute renal failure likely secondary to intractable nausea vomiting felt secondary to cannabis hyperemesis syndrome - IV fluids - Nausea medication - Monitor creatinine - Will at least check an abdominal x-ray to rule out blockage    2.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 H/o Pafib not on anticoagulation  4.  History of PE 08/2019 -in setting of hip fracture.  He completed his course of anticoagulation.  5. H/o alcohol abuse/ Elevated transaminases - - Monitor - Thiamine , folate - Monitor CIWA   Advance Care Planning:   Code Status: Full Code the patient names his mother as his surrogate decision maker and he wants to be full code.  Consults: none  Family Communication: none  Severity of Illness: The appropriate patient status for this patient is INPATIENT. Inpatient status is judged to be reasonable and necessary in order to provide the required intensity of service to ensure the patient's safety. The patient's presenting symptoms, physical exam findings, and initial radiographic and laboratory data in the context of their chronic comorbidities is felt to place them at high risk for further clinical deterioration. Furthermore, it is not anticipated that the patient will be medically stable for discharge from the hospital within 2 midnights of admission.   * I  certify that at the point of admission it is my clinical judgment that the patient will require inpatient hospital care spanning beyond 2 midnights from the point of admission due to  high intensity of service, high risk for further deterioration and high frequency of surveillance required.*  Author: ARTHEA CHILD, MD 08/20/2024 5:41 PM  For on call review [email or web address].

## 2024-08-21 ENCOUNTER — Encounter (HOSPITAL_COMMUNITY): Payer: Self-pay | Admitting: Internal Medicine

## 2024-08-21 ENCOUNTER — Other Ambulatory Visit: Payer: Self-pay

## 2024-08-21 DIAGNOSIS — N179 Acute kidney failure, unspecified: Secondary | ICD-10-CM | POA: Diagnosis not present

## 2024-08-21 LAB — URINALYSIS, ROUTINE W REFLEX MICROSCOPIC
Bacteria, UA: NONE SEEN
Glucose, UA: NEGATIVE mg/dL
Ketones, ur: NEGATIVE mg/dL
Leukocytes,Ua: NEGATIVE
Nitrite: NEGATIVE
Protein, ur: 100 mg/dL — AB
Specific Gravity, Urine: 1.023 (ref 1.005–1.030)
pH: 5 (ref 5.0–8.0)

## 2024-08-21 LAB — URINE DRUG SCREEN
Amphetamines: NEGATIVE
Barbiturates: NEGATIVE
Benzodiazepines: NEGATIVE
Cocaine: POSITIVE — AB
Fentanyl: NEGATIVE
Methadone Scn, Ur: NEGATIVE
Opiates: NEGATIVE
Tetrahydrocannabinol: POSITIVE — AB

## 2024-08-21 LAB — CBC
HCT: 36.8 % — ABNORMAL LOW (ref 39.0–52.0)
Hemoglobin: 12.6 g/dL — ABNORMAL LOW (ref 13.0–17.0)
MCH: 29.4 pg (ref 26.0–34.0)
MCHC: 34.2 g/dL (ref 30.0–36.0)
MCV: 85.8 fL (ref 80.0–100.0)
Platelets: 208 K/uL (ref 150–400)
RBC: 4.29 MIL/uL (ref 4.22–5.81)
RDW: 17 % — ABNORMAL HIGH (ref 11.5–15.5)
WBC: 6.6 K/uL (ref 4.0–10.5)
nRBC: 0 % (ref 0.0–0.2)

## 2024-08-21 LAB — BASIC METABOLIC PANEL WITH GFR
Anion gap: 19 — ABNORMAL HIGH (ref 5–15)
BUN: 27 mg/dL — ABNORMAL HIGH (ref 6–20)
CO2: 26 mmol/L (ref 22–32)
Calcium: 9.3 mg/dL (ref 8.9–10.3)
Chloride: 88 mmol/L — ABNORMAL LOW (ref 98–111)
Creatinine, Ser: 1.99 mg/dL — ABNORMAL HIGH (ref 0.61–1.24)
GFR, Estimated: 41 mL/min — ABNORMAL LOW (ref >60)
Glucose, Bld: 129 mg/dL — ABNORMAL HIGH (ref 70–99)
Potassium: 3.6 mmol/L (ref 3.5–5.1)
Sodium: 133 mmol/L — ABNORMAL LOW (ref 135–145)

## 2024-08-21 LAB — POTASSIUM: Potassium: 3.2 mmol/L — ABNORMAL LOW (ref 3.5–5.1)

## 2024-08-21 MED ORDER — ENOXAPARIN SODIUM 40 MG/0.4ML IJ SOSY
40.0000 mg | PREFILLED_SYRINGE | INTRAMUSCULAR | Status: DC
  Administered 2024-08-22: 40 mg via SUBCUTANEOUS
  Filled 2024-08-21: qty 0.4

## 2024-08-21 MED ORDER — THIAMINE MONONITRATE 100 MG PO TABS
100.0000 mg | ORAL_TABLET | Freq: Every day | ORAL | Status: DC
  Administered 2024-08-22: 100 mg via ORAL
  Filled 2024-08-21: qty 1

## 2024-08-21 MED ORDER — PANTOPRAZOLE SODIUM 40 MG PO TBEC
40.0000 mg | DELAYED_RELEASE_TABLET | Freq: Every day | ORAL | Status: DC
  Administered 2024-08-22: 40 mg via ORAL
  Filled 2024-08-21: qty 1

## 2024-08-21 NOTE — Progress Notes (Signed)
 Progress Note   Patient: Richard Lopez FMW:[id number] DOB: 09-07-1976 DOA: 08/20/2024     1 DOS: the patient was seen and examined on 08/21/2024   Brief hospital course: H&P HPI: Richard Lopez is a 48 y.o. male with medical history significant for cannabis abuse, alcohol abuse, esophageal ulcers and esophagitis, hypertension, and history of PE who presents because of progressive dizziness and weakness and intractable vomiting/dry heaving for the last 4 days.  The patient last used marijuana 7 days ago.  The patient was seen in the emergency department 4 days ago because he fell and he was feeling very dizzy.  He also complained of back pain.  His workup at that time included a CT of his L-spine.  He was found to have multiple bulging disks, no cord compression.  He was discharged from the emergency department and told to hydrate orally but the patient has not been able to keep down liquids.  Even when nothing comes up he is dry heaving almost continuously.  He does have some mild abdominal pain.  No diarrhea or fevers.  He recognizes that this could be because of his cannabis use 7 days ago that he has been unable to stop the dry heaving.  He he is extremely dizzy so he came back in for evaluation. In the emergency department he received 1 L of normal saline.  The hospitalist were called to admit him when his blood work revealed a creatinine of 3.25.  4 days ago his creatinine was 0.89 The patient has not been able to urinate to produce a urine sample. The patient said he had an EGD 3 months ago and it looked good.  He has not had this trouble with recurrent vomiting in the last 3 months.    Assessment and Plan: AKI d/t hypovolemia  N/V has improved  Now taking in PO Scr improved with IV fluids Continue to monitor    Hyponatremia  D/t hypovolemia  IV fluids , improved.   P afib not on AC  Currently in NSR   Alcohol use disorder   Elevated transaminases  In the setting of cocaine and  alcohol use   Cocaine and THC use  TOC counsel on cessation.       Subjective: Feels n/v is improved wanting to eat more this AM.   Physical Exam: Vitals:   08/20/24 2316 08/21/24 0251 08/21/24 0556 08/21/24 1300  BP:  (!) 154/97 (!) 152/83 132/81  Pulse:  88 90 (!) 102  Resp:  18  20  Temp:  98.1 F (36.7 C) 97.9 F (36.6 C) (!) 97.5 F (36.4 C)  TempSrc:  Oral Oral   SpO2:  100% 100% 100%  Weight: 96 kg     Height: 6' 6 (1.981 m)      Physical Exam  Constitutional: In no distress.  Cardiovascular: Normal rate, regular rhythm. No lower extremity edema  Pulmonary: Non labored breathing on room air, no wheezing or rales.   Abdominal: Soft. Non distended and non tender Musculoskeletal: Normal range of motion.     Neurological: Alert and oriented to person, place, and time. Non focal  Skin: Skin is warm and dry.   Data Reviewed:     Latest Ref Rng & Units 08/21/2024    5:25 AM 08/20/2024    2:32 PM 08/16/2024    4:26 PM  BMP  Glucose 70 - 99 mg/dL 870  719  886   BUN 6 - 20 mg/dL 27  24  8   Creatinine 0.61 - 1.24 mg/dL 8.00  6.74  9.10   Sodium 135 - 145 mmol/L 133  131  137   Potassium 3.5 - 5.1 mmol/L 3.6  3.6  3.8   Chloride 98 - 111 mmol/L 88  81  95   CO2 22 - 32 mmol/L 26  23  25    Calcium  8.9 - 10.3 mg/dL 9.3  89.5  9.0       Family Communication: None  Disposition: Status is: Inpatient Remains inpatient appropriate because: Aki   Planned Discharge Destination: Home    Time spent: 35 minutes  Author: Alban Pepper, MD 08/21/2024 7:02 PM  For on call review [email or web address].

## 2024-08-22 DIAGNOSIS — N179 Acute kidney failure, unspecified: Secondary | ICD-10-CM | POA: Diagnosis not present

## 2024-08-22 LAB — COMPREHENSIVE METABOLIC PANEL WITH GFR
ALT: 83 U/L — ABNORMAL HIGH (ref 0–44)
AST: 98 U/L — ABNORMAL HIGH (ref 15–41)
Albumin: 4.1 g/dL (ref 3.5–5.0)
Alkaline Phosphatase: 63 U/L (ref 38–126)
Anion gap: 15 (ref 5–15)
BUN: 19 mg/dL (ref 6–20)
CO2: 29 mmol/L (ref 22–32)
Calcium: 8.8 mg/dL — ABNORMAL LOW (ref 8.9–10.3)
Chloride: 89 mmol/L — ABNORMAL LOW (ref 98–111)
Creatinine, Ser: 1.01 mg/dL (ref 0.61–1.24)
GFR, Estimated: >60 mL/min (ref >60)
Glucose, Bld: 114 mg/dL — ABNORMAL HIGH (ref 70–99)
Potassium: 3 mmol/L — ABNORMAL LOW (ref 3.5–5.1)
Sodium: 133 mmol/L — ABNORMAL LOW (ref 135–145)
Total Bilirubin: 0.9 mg/dL (ref 0.0–1.2)
Total Protein: 7.1 g/dL (ref 6.5–8.1)

## 2024-08-22 LAB — CBC
HCT: 36.5 % — ABNORMAL LOW (ref 39.0–52.0)
Hemoglobin: 12.1 g/dL — ABNORMAL LOW (ref 13.0–17.0)
MCH: 28.5 pg (ref 26.0–34.0)
MCHC: 33.2 g/dL (ref 30.0–36.0)
MCV: 86.1 fL (ref 80.0–100.0)
Platelets: 229 K/uL (ref 150–400)
RBC: 4.24 MIL/uL (ref 4.22–5.81)
RDW: 16.8 % — ABNORMAL HIGH (ref 11.5–15.5)
WBC: 5.7 K/uL (ref 4.0–10.5)
nRBC: 0 % (ref 0.0–0.2)

## 2024-08-22 LAB — IRON AND TIBC
Iron: 42 ug/dL — ABNORMAL LOW (ref 45–182)
Saturation Ratios: 18 % (ref 17.9–39.5)
TIBC: 238 ug/dL — ABNORMAL LOW (ref 250–450)
UIBC: 196 ug/dL

## 2024-08-22 LAB — FOLATE: Folate: 19.5 ng/mL (ref >5.9)

## 2024-08-22 LAB — PHOSPHORUS: Phosphorus: 2.8 mg/dL (ref 2.5–4.6)

## 2024-08-22 LAB — FERRITIN: Ferritin: 1031 ng/mL — ABNORMAL HIGH (ref 24–336)

## 2024-08-22 LAB — MAGNESIUM: Magnesium: 1.6 mg/dL — ABNORMAL LOW (ref 1.7–2.4)

## 2024-08-22 LAB — VITAMIN B12: Vitamin B-12: 1002 pg/mL — ABNORMAL HIGH (ref 180–914)

## 2024-08-22 MED ORDER — POTASSIUM CHLORIDE 20 MEQ PO PACK
40.0000 meq | PACK | ORAL | Status: AC
  Administered 2024-08-22 (×2): 40 meq via ORAL
  Filled 2024-08-22 (×2): qty 2

## 2024-08-22 MED ORDER — FERROUS SULFATE 325 (65 FE) MG PO TABS
325.0000 mg | ORAL_TABLET | Freq: Every day | ORAL | Status: DC
  Administered 2024-08-22: 325 mg via ORAL
  Filled 2024-08-22: qty 1

## 2024-08-22 MED ORDER — MAGNESIUM SULFATE 4 GM/100ML IV SOLN
4.0000 g | Freq: Once | INTRAVENOUS | Status: AC
  Administered 2024-08-22: 4 g via INTRAVENOUS
  Filled 2024-08-22: qty 100

## 2024-08-22 MED ORDER — POLYETHYLENE GLYCOL 3350 17 G PO PACK
17.0000 g | PACK | Freq: Every day | ORAL | Status: DC
  Filled 2024-08-22: qty 1

## 2024-08-22 MED ORDER — POTASSIUM CHLORIDE CRYS ER 20 MEQ PO TBCR: 20.0000 meq | EXTENDED_RELEASE_TABLET | Freq: Every day | ORAL | 0 refills | Status: DC

## 2024-08-22 MED ORDER — FERROUS SULFATE 325 (65 FE) MG PO TABS: 325.0000 mg | ORAL_TABLET | Freq: Every day | ORAL | 3 refills | Status: AC

## 2024-08-22 MED ORDER — VITAMIN B-1 100 MG PO TABS: 100.0000 mg | ORAL_TABLET | Freq: Every day | ORAL | 3 refills | Status: DC

## 2024-08-22 MED ORDER — POLYETHYLENE GLYCOL 3350 17 G PO PACK: 17.0000 g | PACK | Freq: Every day | ORAL | 0 refills | Status: AC

## 2024-08-22 MED ORDER — SODIUM CHLORIDE 0.9 % IV BOLUS
1000.0000 mL | Freq: Once | INTRAVENOUS | Status: AC
  Administered 2024-08-22: 1000 mL via INTRAVENOUS

## 2024-08-22 NOTE — Plan of Care (Signed)

## 2024-08-22 NOTE — Progress Notes (Signed)
 Mobility Specialist - Progress Note   08/22/24 0800  Mobility  Activity Ambulated with assistance  Level of Assistance Standby assist, set-up cues, supervision of patient - no hands on  Assistive Device Cane  Distance Ambulated (ft) 500 ft  Range of Motion/Exercises Active  Activity Response Tolerated well  Mobility Referral Yes  Mobility visit 1 Mobility  Mobility Specialist Start Time (ACUTE ONLY) 0750  Mobility Specialist Stop Time (ACUTE ONLY) 0810  Mobility Specialist Time Calculation (min) (ACUTE ONLY) 20 min   Pt was found in bed and agreeable to mobilize. No complaints. At EOS returned to bed with all needs met. Call bell in reach and RN notified.   Erminio Leos,  Mobility Specialist Can be reached via Secure Chat

## 2024-08-22 NOTE — Progress Notes (Signed)
 Mobility Specialist Progress Note:   08/22/24 1229  Mobility  Activity Ambulated with assistance  Level of Assistance Contact guard assist, steadying assist  Assistive Device Cane  Distance Ambulated (ft) 350 ft  Activity Response Tolerated well  Mobility Referral Yes  Mobility visit 1 Mobility  Mobility Specialist Start Time (ACUTE ONLY) 1218  Mobility Specialist Stop Time (ACUTE ONLY) 1228  Mobility Specialist Time Calculation (min) (ACUTE ONLY) 10 min   Pt was received in bed and agreed to mobility. No complaints during ambulation. Returned to bed with all needs met.  [employer] - Mobility Specialist

## 2024-08-22 NOTE — Progress Notes (Signed)
 AVS and discharge instructions reviewed w/ patient. Patient verbalized understanding.

## 2024-08-22 NOTE — TOC Initial Note (Signed)
 Transition of Care [facility]) - Initial/Assessment Note    Patient Details  Name: Richard Lopez MRN: [id number] Date of Birth: 1975/11/18  [facility] CM/SW Contact:    Bascom Service, RN Phone Number: 08/22/2024, 11:43 AM  Clinical Narrative: d/c plan home. ETOH resources added to AVS.                  Expected Discharge Plan: Home/Self Care Barriers to Discharge: Continued Medical Work up   Patient Goals and CMS Choice Patient states their goals for this hospitalization and ongoing recovery are:: Home CMS Medicare.gov Compare Post Acute Care list provided to:: Patient Choice offered to / list presented to : Patient [place] ownership interest in [facility].provided to:: Patient    Expected Discharge Plan and Services                                              Prior Living Arrangements/Services   Lives with:: Self                   Activities of Daily Living   ADL Screening (condition at time of admission) Independently performs ADLs?: Yes (appropriate for developmental age) Is the patient deaf or have difficulty hearing?: No Does the patient have difficulty seeing, even when wearing glasses/contacts?: No Does the patient have difficulty concentrating, remembering, or making decisions?: No  Permission Sought/Granted                  Emotional Assessment              Admission diagnosis:  Dehydration [E86.0] Acute renal failure (ARF) [N17.9] AKI (acute kidney injury) [N17.9] Patient Active Problem List   Diagnosis Date Noted   Acute renal failure (ARF) 08/20/2024   Esophagitis 04/12/2024   Intractable nausea and vomiting 04/11/2024   Cannabis hyperemesis syndrome concurrent with and due to cannabis abuse 04/11/2024   Metabolic alkalosis 04/11/2024   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   Alcohol abuse 08/05/2023   Depression 06/17/2023   Hypercalcemia 04/20/2023   AF (paroxysmal  atrial fibrillation) (HCC)    AKI (acute kidney injury) 06/18/2020   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   Hyponatremia 04/21/2020   Anemia 08/21/2019   Hypokalemia 08/17/2019   Hemoptysis 08/17/2019   Upper GI bleed 08/13/2019   Cavitary lesion of lung 08/01/2019   Gout 05/18/2019   Peri-prosthetic fracture of femur at tip of prosthesis 05/16/2019   Alcohol use 05/16/2019   Vitamin D  deficiency 04/14/2019   Thrombocytopenia 03/25/2019   Prediabetes 04/02/2017   Marijuana abuse 04/01/2017   Cannabinoid hyperemesis syndrome 07/29/2014   Type 2 diabetes mellitus with hyperlipidemia (HCC) 07/29/2014   Essential hypertension 07/29/2014   PCP:  Celestia Rosaline SQUIBB, NP Pharmacy:   [facility] [id number]), [place] - [address] [place] ([place]) [place] [address] Phone: [phone] Fax: [phone]  [place] [facility] [address] [place] [address] Phone: [phone] Fax: [phone]     Social Drivers of Health (SDOH) Social History: SDOH Screenings   Food Insecurity: No Food Insecurity (08/20/2024)  Housing: Low Risk  (08/20/2024)  Transportation Needs: No Transportation Needs (08/20/2024)  Recent Concern: Transportation Needs - Unmet Transportation Needs (05/31/2024)  Utilities: Not At Risk (08/20/2024)  Alcohol Screen: Low Risk  (  05/31/2024)  Depression (PHQ2-9): Medium Risk (05/31/2024)  Financial Resource Strain: Medium Risk (06/24/2024)  Physical Activity: Unknown (11/07/2018)  Social Connections: Moderately Isolated (09/16/2023)  Stress: Stress Concern Present (12/25/2023)  Tobacco Use: Medium Risk (08/21/2024)  Health Literacy: Inadequate Health Literacy (09/16/2023)   SDOH Interventions:     Readmission Risk Interventions    04/11/2024    3:34 PM 03/17/2024   10:42 AM 08/25/2023   12:27 PM  Readmission Risk Prevention Plan  Transportation Screening Complete Complete Complete  PCP or Specialist  Appt within 3-5 Days  Complete Complete  HRI or Home Care Consult  Complete Complete  Social Work Consult for Recovery Care Planning/Counseling  Complete Complete  Palliative Care Screening  Not Applicable Not Applicable  Medication Review [employer]) Complete Complete Complete  PCP or Specialist appointment within 3-5 days of discharge Complete    HRI or Home Care Consult Complete    SW Recovery Care/Counseling Consult Complete    Palliative Care Screening Not Applicable    Skilled Nursing Facility Not Applicable

## 2024-08-22 NOTE — Discharge Instructions (Signed)
 Please follow up with your primary care phsycian to make sure your potassium levels normal

## 2024-08-23 ENCOUNTER — Telehealth: Payer: Self-pay

## 2024-08-23 NOTE — Transitions of Care (Post Inpatient/ED Visit) (Signed)
   08/23/2024  Name: Richard Lopez MRN: [id number] DOB: 12-06-1975  Today's TOC FU Call Status: Today's TOC FU Call Status:: Unsuccessful Call (1st Attempt) Unsuccessful Call (1st Attempt) Date: 08/23/24  Attempted to reach the patient regarding the most recent Inpatient/ED visit.  Follow Up Plan: Additional outreach attempts will be made to reach the patient to complete the Transitions of Care (Post Inpatient/ED visit) call.   Alan Ee, RN, BSN, CEN [employer]- Transition of Care Team.  [facility] [phone]

## 2024-08-24 ENCOUNTER — Emergency Department (HOSPITAL_COMMUNITY)

## 2024-08-24 ENCOUNTER — Other Ambulatory Visit: Payer: Self-pay

## 2024-08-24 ENCOUNTER — Inpatient Hospital Stay (HOSPITAL_COMMUNITY)
Admission: EM | Admit: 2024-08-24 | Discharge: 2024-08-26 | DRG: 683 | Disposition: A | Discharge status: Home / Self Care | Attending: Internal Medicine | Admitting: Internal Medicine

## 2024-08-24 DIAGNOSIS — F1910 Other psychoactive substance abuse, uncomplicated: Secondary | ICD-10-CM | POA: Diagnosis not present

## 2024-08-24 DIAGNOSIS — K0889 Other specified disorders of teeth and supporting structures: Secondary | ICD-10-CM | POA: Diagnosis present

## 2024-08-24 DIAGNOSIS — Z8719 Personal history of other diseases of the digestive system: Secondary | ICD-10-CM | POA: Diagnosis not present

## 2024-08-24 DIAGNOSIS — I4891 Unspecified atrial fibrillation: Secondary | ICD-10-CM | POA: Diagnosis present

## 2024-08-24 DIAGNOSIS — F1210 Cannabis abuse, uncomplicated: Secondary | ICD-10-CM | POA: Diagnosis present

## 2024-08-24 DIAGNOSIS — R5381 Other malaise: Secondary | ICD-10-CM | POA: Diagnosis present

## 2024-08-24 DIAGNOSIS — Z86711 Personal history of pulmonary embolism: Secondary | ICD-10-CM | POA: Diagnosis not present

## 2024-08-24 DIAGNOSIS — I10 Essential (primary) hypertension: Secondary | ICD-10-CM | POA: Diagnosis present

## 2024-08-24 DIAGNOSIS — M1A09X1 Idiopathic chronic gout, multiple sites, with tophus (tophi): Secondary | ICD-10-CM | POA: Diagnosis present

## 2024-08-24 DIAGNOSIS — R1116 Cannabis hyperemesis syndrome: Secondary | ICD-10-CM | POA: Diagnosis present

## 2024-08-24 DIAGNOSIS — Z79899 Other long term (current) drug therapy: Secondary | ICD-10-CM

## 2024-08-24 DIAGNOSIS — K219 Gastro-esophageal reflux disease without esophagitis: Secondary | ICD-10-CM

## 2024-08-24 DIAGNOSIS — N179 Acute kidney failure, unspecified: Secondary | ICD-10-CM | POA: Diagnosis present

## 2024-08-24 DIAGNOSIS — E871 Hypo-osmolality and hyponatremia: Secondary | ICD-10-CM | POA: Diagnosis present

## 2024-08-24 DIAGNOSIS — E1165 Type 2 diabetes mellitus with hyperglycemia: Secondary | ICD-10-CM | POA: Diagnosis present

## 2024-08-24 DIAGNOSIS — Z91148 Patient's other noncompliance with medication regimen for other reason: Secondary | ICD-10-CM | POA: Diagnosis not present

## 2024-08-24 DIAGNOSIS — N19 Unspecified kidney failure: Secondary | ICD-10-CM | POA: Diagnosis present

## 2024-08-24 DIAGNOSIS — E861 Hypovolemia: Secondary | ICD-10-CM | POA: Diagnosis present

## 2024-08-24 LAB — COMPREHENSIVE METABOLIC PANEL WITH GFR
ALT: 63 U/L — ABNORMAL HIGH (ref 0–44)
AST: 43 U/L — ABNORMAL HIGH (ref 15–41)
Albumin: 4.9 g/dL (ref 3.5–5.0)
Alkaline Phosphatase: 88 U/L (ref 38–126)
Anion gap: 20 — ABNORMAL HIGH (ref 5–15)
BUN: 30 mg/dL — ABNORMAL HIGH (ref 6–20)
CO2: 26 mmol/L (ref 22–32)
Calcium: 10.3 mg/dL (ref 8.9–10.3)
Chloride: 80 mmol/L — ABNORMAL LOW (ref 98–111)
Creatinine, Ser: 4.08 mg/dL — ABNORMAL HIGH (ref 0.61–1.24)
GFR, Estimated: 17 mL/min — ABNORMAL LOW (ref >60)
Glucose, Bld: 262 mg/dL — ABNORMAL HIGH (ref 70–99)
Potassium: 4 mmol/L (ref 3.5–5.1)
Sodium: 126 mmol/L — ABNORMAL LOW (ref 135–145)
Total Bilirubin: 0.8 mg/dL (ref 0.0–1.2)
Total Protein: 9.7 g/dL — ABNORMAL HIGH (ref 6.5–8.1)

## 2024-08-24 LAB — CBC
HCT: 42.9 % (ref 39.0–52.0)
Hemoglobin: 14.7 g/dL (ref 13.0–17.0)
MCH: 29.3 pg (ref 26.0–34.0)
MCHC: 34.3 g/dL (ref 30.0–36.0)
MCV: 85.6 fL (ref 80.0–100.0)
Platelets: 404 K/uL — ABNORMAL HIGH (ref 150–400)
RBC: 5.01 MIL/uL (ref 4.22–5.81)
RDW: 16.4 % — ABNORMAL HIGH (ref 11.5–15.5)
WBC: 10.4 K/uL (ref 4.0–10.5)
nRBC: 0 % (ref 0.0–0.2)

## 2024-08-24 LAB — I-STAT CHEM 8, ED
BUN: 31 mg/dL — ABNORMAL HIGH (ref 6–20)
Calcium, Ion: 1.09 mmol/L — ABNORMAL LOW (ref 1.15–1.40)
Chloride: 83 mmol/L — ABNORMAL LOW (ref 98–111)
Creatinine, Ser: 4.7 mg/dL — ABNORMAL HIGH (ref 0.61–1.24)
Glucose, Bld: 259 mg/dL — ABNORMAL HIGH (ref 70–99)
HCT: 49 % (ref 39.0–52.0)
Hemoglobin: 16.7 g/dL (ref 13.0–17.0)
Potassium: 3.9 mmol/L (ref 3.5–5.1)
Sodium: 125 mmol/L — ABNORMAL LOW (ref 135–145)
TCO2: 29 mmol/L (ref 22–32)

## 2024-08-24 LAB — LIPASE, BLOOD: Lipase: 38 U/L (ref 11–51)

## 2024-08-24 LAB — TROPONIN T, HIGH SENSITIVITY: Troponin T High Sensitivity: 22 ng/L — ABNORMAL HIGH (ref 0–19)

## 2024-08-24 LAB — ETHANOL: Alcohol, Ethyl (B): <15 mg/dL (ref <15)

## 2024-08-24 LAB — CK: Total CK: 241 U/L (ref 49–397)

## 2024-08-24 MED ORDER — INSULIN ASPART 100 UNIT/ML IJ SOLN
0.0000 [IU] | Freq: Every day | INTRAMUSCULAR | Status: DC
  Filled 2024-08-24: qty 0.05

## 2024-08-24 MED ORDER — CALCIUM CARBONATE ANTACID 1250 MG/5ML PO SUSP: 500.0000 mg | Freq: Four times a day (QID) | ORAL | Status: DC | PRN

## 2024-08-24 MED ORDER — ACETAMINOPHEN 325 MG PO TABS
650.0000 mg | ORAL_TABLET | Freq: Four times a day (QID) | ORAL | Status: DC | PRN
  Administered 2024-08-26: 650 mg via ORAL
  Filled 2024-08-24: qty 2

## 2024-08-24 MED ORDER — PANTOPRAZOLE SODIUM 40 MG IV SOLR
40.0000 mg | Freq: Two times a day (BID) | INTRAVENOUS | Status: DC
  Administered 2024-08-24 – 2024-08-26 (×4): 40 mg via INTRAVENOUS
  Filled 2024-08-24 (×4): qty 10

## 2024-08-24 MED ORDER — SODIUM CHLORIDE 0.9 % IV SOLN: INTRAVENOUS | Status: AC

## 2024-08-24 MED ORDER — LACTATED RINGERS IV BOLUS
2000.0000 mL | Freq: Once | INTRAVENOUS | Status: AC
  Administered 2024-08-24: 2000 mL via INTRAVENOUS

## 2024-08-24 MED ORDER — ACETAMINOPHEN 650 MG RE SUPP: 650.0000 mg | Freq: Four times a day (QID) | RECTAL | Status: DC | PRN

## 2024-08-24 MED ORDER — ONDANSETRON HCL 4 MG PO TABS: 4.0000 mg | ORAL_TABLET | Freq: Four times a day (QID) | ORAL | Status: DC | PRN

## 2024-08-24 MED ORDER — DOCUSATE SODIUM 283 MG RE ENEM: 1.0000 | ENEMA | RECTAL | Status: DC | PRN

## 2024-08-24 MED ORDER — SORBITOL 70 % SOLN: 30.0000 mL | Status: DC | PRN

## 2024-08-24 MED ORDER — ONDANSETRON HCL 4 MG/2ML IJ SOLN: 4.0000 mg | Freq: Four times a day (QID) | INTRAMUSCULAR | Status: DC | PRN

## 2024-08-24 MED ORDER — HEPARIN SODIUM (PORCINE) 5000 UNIT/ML IJ SOLN
5000.0000 [IU] | Freq: Three times a day (TID) | INTRAMUSCULAR | Status: DC
  Administered 2024-08-25 – 2024-08-26 (×3): 5000 [IU] via SUBCUTANEOUS
  Filled 2024-08-24 (×3): qty 1

## 2024-08-24 MED ORDER — INSULIN ASPART 100 UNIT/ML IJ SOLN
0.0000 [IU] | Freq: Three times a day (TID) | INTRAMUSCULAR | Status: DC
  Administered 2024-08-25: 1 [IU] via SUBCUTANEOUS
  Administered 2024-08-25: 2 [IU] via SUBCUTANEOUS
  Administered 2024-08-26: 1 [IU] via SUBCUTANEOUS
  Filled 2024-08-24: qty 0.09

## 2024-08-24 NOTE — ED Notes (Signed)
 US  PIV placed, L AC.

## 2024-08-24 NOTE — ED Provider Notes (Signed)
 [place] EMERGENCY DEPARTMENT AT [facility] Provider Note   CSN: [id number] Arrival date & time: 08/24/24  2002     Patient presents with: Abdominal Pain   Richard Lopez is a 49 y.o. male.   HPI     48 y.o. male with medical history significant for cannabis abuse, alcohol abuse, esophageal ulcers and esophagitis, hypertension, and history of PE presents to the emergency room with chief complaint of cramping abdominal pain, chest pain, nausea and vomiting.   Patient reports that he was discharged from the hospital on Monday.  He was feeling fine initially.  However 2 days ago he started experiencing nausea with vomiting and also started having cramping pain.  He was waiting until yesterday when he stopped peeing.  His last alcohol use was 2 days ago.  Patient denies any bloody vomit or stool.  Prior to Admission medications   Medication Sig Start Date End Date Taking? Authorizing Provider  amLODipine  (NORVASC ) 10 MG tablet Take 1 tablet (10 mg total) by mouth daily. 05/16/24 08/20/24  Celestia Rosaline SQUIBB, NP  Blood Glucose-BP Monitor (BLOOD GLUCOSE-WRIST BP MONITOR) DEVI 1 Bag by Does not apply route in the morning, at noon, and at bedtime. 04/24/24   Celestia Rosaline SQUIBB, NP  Blood Pressure Monitoring (BLOOD PRESSURE MONITOR AUTOMAT) DEVI 1 kit by Does not apply route in the morning, at noon, and at bedtime. 05/16/24   Celestia Rosaline SQUIBB, NP  ferrous sulfate 325 (65 FE) MG tablet Take 1 tablet (325 mg total) by mouth daily with breakfast. 08/23/24   Franchot Novel, MD  Multiple Vitamin (MULTIVITAMIN WITH MINERALS) TABS tablet Take 1 tablet by mouth daily. Patient not taking: No sig reported 03/02/24   Celestia Rosaline SQUIBB, NP  ondansetron  (ZOFRAN ) 4 MG tablet Take 1 tablet (4 mg total) by mouth every 8 (eight) hours as needed for nausea or vomiting. 03/18/24   Lue Elsie BROCKS, MD  pantoprazole  (PROTONIX ) 40 MG tablet Take 2 tablets (80 mg total) by mouth daily.  04/13/24 08/20/24  Christobal Guadalajara, MD  polyethylene glycol (MIRALAX ) 17 g packet Take 17 g by mouth daily. 08/22/24   Franchot Novel, MD  potassium chloride  SA (KLOR-CON  M) 20 MEQ tablet Take 1 tablet (20 mEq total) by mouth daily. 08/22/24   Franchot Novel, MD  thiamine  (VITAMIN B-1) 100 MG tablet Take 1 tablet (100 mg total) by mouth daily. 08/23/24   Franchot Novel, MD    Allergies: Ativan  [lorazepam ] and Nsaids    Review of Systems  All other systems reviewed and are negative.   Updated Vital Signs BP (!) 154/96   Pulse (!) 105   Temp 98.1 F (36.7 C)   Resp 17   SpO2 100%   Physical Exam Vitals and nursing note reviewed.  Constitutional:      Appearance: He is well-developed.  HENT:     Head: Atraumatic.  Cardiovascular:     Rate and Rhythm: Normal rate.  Pulmonary:     Effort: Pulmonary effort is normal.  Abdominal:     Tenderness: There is generalized abdominal tenderness. There is no guarding or rebound.  Musculoskeletal:     Cervical back: Neck supple.  Skin:    General: Skin is warm.  Neurological:     Mental Status: He is alert and oriented to person, place, and time.     (all labs ordered are listed, but only abnormal results are displayed) Labs Reviewed  COMPREHENSIVE METABOLIC PANEL WITH GFR - Abnormal; Notable for  the following components:      Result Value   Sodium 126 (*)    Chloride 80 (*)    Glucose, Bld 262 (*)    BUN 30 (*)    Creatinine, Ser 4.08 (*)    Total Protein 9.7 (*)    AST 43 (*)    ALT 63 (*)    GFR, Estimated 17 (*)    Anion gap 20 (*)    All other components within normal limits  CBC - Abnormal; Notable for the following components:   RDW 16.4 (*)    Platelets 404 (*)    All other components within normal limits  I-STAT CHEM 8, ED - Abnormal; Notable for the following components:   Sodium 125 (*)    Chloride 83 (*)    BUN 31 (*)    Creatinine, Ser 4.70 (*)    Glucose, Bld 259 (*)    Calcium , Ion 1.09 (*)    All  other components within normal limits  TROPONIN T, HIGH SENSITIVITY - Abnormal; Notable for the following components:   Troponin T High Sensitivity 22 (*)    All other components within normal limits  LIPASE, BLOOD  ETHANOL  CK  URINALYSIS, ROUTINE W REFLEX MICROSCOPIC  URINE DRUG SCREEN  TROPONIN T, HIGH SENSITIVITY    EKG: EKG Interpretation Date/Time:  Wednesday August 24 2024 20:15:41 EDT Ventricular Rate:  126 PR Interval:  114 QRS Duration:  84 QT Interval:  270 QTC Calculation: 391 R Axis:   66  Text Interpretation: Sinus tachycardia with irregular rate Repol abnrm suggests ischemia, diffuse leads PROFOUND TACHYCARDIA NOTED Nonspecific ST and T wave abnormality Confirmed by Charlyn Sora [phone]) on 08/24/2024 8:42:24 PM  Radiology: No results found.   Procedures   Medications Ordered in the ED  lactated ringers  bolus 2,000 mL (2,000 mLs Intravenous New Bag/Given 08/24/24 2107)                                    Medical Decision Making Amount and/or Complexity of Data Reviewed Labs: ordered. Radiology: ordered.  Risk Decision regarding hospitalization.    This patient presents to the ED with chief complaint(s) of cramping abdominal pain, chest pain with pertinent past medical history of diabetes, alcohol and marijuana use disorder, PE, esophageal ulcers.patient was just admitted to the hospital for AKI.  It appears that his creatinine improved, he was discharged with creatinine at baseline level.  The complaint involves an extensive differential diagnosis and also carries with it a high risk of complications and morbidity.    The differential diagnosis includes rhabdo myelosis, AKI, severe dry abnormality, profound dehydration.  Abdominal exam is reassuring, but small bowel obstruction and intra-abdominal infection was also considered in the differential diagnosis.  Alcohol withdrawal syndrome also possible.  The initial plan is to get basic  labs.   Additional history obtained: Records reviewed previous admission documents and Care Everywhere/External Records  Independent labs interpretation:  The following labs were independently interpreted: Patient has AKI with creatinine of 4.7.  BUN is 31. Sodium has dropped to 125.  Treatment and Reassessment: IV fluids initiated.  Patient feeling better.  Heart rate has come down to 100. Pulse ox 100%.  No tachypnea.  Patient denies any chest pain, shortness of breath. At this time, have low suspicion for PE.  We will proceed with admission for AKI, electrolyte abnormality, dehydration.  Ultrasound kidney ordered given that  it does not clearly appear to be prerenal cause.  Final diagnoses:  AKI (acute kidney injury)    ED Discharge Orders     None          Charlyn Sora, MD 08/24/24 2207

## 2024-08-24 NOTE — ED Notes (Signed)
 Patient transported to CT

## 2024-08-24 NOTE — ED Triage Notes (Signed)
 Pt BIB [facility] from home d/t concern for re-occurrent NV and abdominal cramping. Sts today is different because he is breaking out into a sweat, has associated generalized chest pain, and dizziness. Denies any drug use and ETOH use today- Last use 2 days go.   EMS BP 148/92, PR 96, SpO2 100 RA, CBG 302.

## 2024-08-24 NOTE — H&P (Signed)
 HISTORY AND PHYSICAL    Lopez Richard   FMW:[id number] DOB: August 16, 1976   Date of Service: 08/24/24 Requesting physician/APP from ED: Treatment Team:  Attending Provider: Charlyn Sora, MD  PCP: Celestia Rosaline SQUIBB, NP     HPI: Richard Lopez is a 48 y.o. male with PMH gastritis/esophagitis, cannabis use, amphetamine use, alcohol use, HTN, history of PE completed anticoagulation course.  Concern for cannabinoid hyperemesis - UDS is consistently positive for THC over the past 2 years.  Presents to the emergency department from home via EMS due to concern for recurrent nausea/vomiting starting this morning, abdominal cramping this afternoon, reports today is different/worse due to breaking out into sweat association with generalized chest pain and dizziness but this has resolved upon admission this evening.  Reports last drug/EtOH use 2 days prior to this, which would have been his date of discharge from the hospital. Reports he has not used other drugs since discharge except cannabis. Reports has been not eating/drinking as much d/t dental pain.   Recent admission 08/20/24 and discharge 08/22/24 with nausea/vomiting leading to low p.o. intake, AKI, hypovolemia, hyponatremia.  History of alcohol use disorder, UDS positive for cocaine and THC upon admission.  Discharge summary not available at this time but labs on day of discharge note sodium 133, chloride 89 -levels appear stable/chronic.  Creatinine 1.01, of note was 3.25 on admission 08/20/24, liver enzymes trending down.   Of note - EGD 04/12/24 noted esophagitis without bleeding, few superficial esophageal ulcers, hiatal hernia, localized severely erythematous mucosa without bleeding in gastric cardia, likely related to retching, biopsies taken for H. pylori, gastritis elsewhere, normal duodenum and biopsies taken for celiac disease.  Recommendation at that time for PPI twice daily x 2 months, sucralfate  4 times daily x 1 month, avoid  marijuana and alcohol use.  This was a follow-up EGD, had 1 earlier that year 11/13/23 noting severe esophagitis, many superficial esophageal ulcers, again significant gastritis    Consultants:  none  Procedures: none      ASSESSMENT & PLAN:   Acute renal failure secondary to poor p.o. intake, intractable N/B, cannabinoid hyperemesis syndrome Reduced urine output Renal ultrasound negative IV fluids - ordered 2 more liters  Trend BMP 02:00 and 06:00  Strict I&O Nausea control  Low threshold for nephrology consult if renal fxn not improving, Cr was WNL at discharge 2 days ago   Hyponatremia Likely hypovolemic / renal failure Correct underlying disorders as noted Trend BMP 02:00 and 06:00 - Close monitor sodium to prevent overcorrection, goal for 134 in 24h (can probably correct faster since this appears acute but will have goal 8 no more than 10 / 24h correction)    History severe gastritis, esophagitis Likely related to persistent vomiting No evidence for bleeding at this time GI consult if Hgb dropping / signs of bleeding  IV PPI  Hyperglycemia  SSI achs renal dose, titrate as needed based on Glc / insulin  requirement   History alcohol abuse Abstinence stressed to patient  UDS positive cocaine/amphetamine and THC last admission, admitted continued cannabis use  Abstinence stressed to patient  Hx Afib currently in sinus Hx PE completed anticoagulation for provoked VTE  Monitor Telemetry x24h renew if needed     DVT prophylaxis: SCD Pertinent IV fluids/nutrition: mech soft diet d/t dental pain, IV fluids as above  Central lines / invasive devices: none  Code Status: FULL CODE Family Communication: none  Disposition: inpatient, tele x24h but if no events can likely dc TOC  needs: TBD Barriers to discharge / significant pending items: renal failure, hyponatremia                  Review of Systems:  Review of Systems  Constitutional:   Positive for malaise/fatigue. Negative for chills and fever.  HENT:  Negative for sinus pain and sore throat.   Respiratory:  Negative for cough and shortness of breath.   Cardiovascular:  Positive for chest pain (reports sore from vomiting). Negative for orthopnea and leg swelling.  Gastrointestinal:  Positive for abdominal pain, nausea and vomiting. Negative for blood in stool, constipation, diarrhea, heartburn and melena.  Genitourinary:        No urine output since yesterday  Musculoskeletal:  Positive for myalgias (chronic nothing new / worse).  Skin:  Negative for rash.  Neurological:  Positive for dizziness and weakness. Negative for focal weakness, seizures, loss of consciousness and headaches.       has a past medical history of Alcohol dependence with intoxication (HCC) (03/25/2019), Anxiety, Arthritis, Atrial fibrillation with RVR (HCC) (04/22/2020), Closed right hip fracture, initial encounter (HCC) (03/25/2019), Depression, Diabetes mellitus, Esophagitis determined by endoscopy (08/21/2019), Essential hypertension, Gastritis, Gout, Intertrochanteric fracture of right hip (HCC) (04/14/2019), Normocytic anemia due to blood loss (08/21/2019), and Pulmonary embolism (HCC).  No current facility-administered medications on file prior to encounter.   Current Outpatient Medications on File Prior to Encounter  Medication Sig Dispense Refill   amLODipine  (NORVASC ) 10 MG tablet Take 10 mg by mouth daily.     ferrous sulfate 325 (65 FE) MG tablet Take 1 tablet (325 mg total) by mouth daily with breakfast. 30 tablet 3   Multiple Vitamin (MULTIVITAMIN WITH MINERALS) TABS tablet Take 1 tablet by mouth daily. 90 tablet 3   pantoprazole  (PROTONIX ) 40 MG tablet Take 40 mg by mouth 2 (two) times daily.     potassium chloride  SA (KLOR-CON  M) 20 MEQ tablet Take 1 tablet (20 mEq total) by mouth daily. 30 tablet 0   sucralfate  (CARAFATE ) 1 GM/10ML suspension Take 1 g by mouth 4 (four) times daily.      thiamine  (VITAMIN B-1) 100 MG tablet Take 1 tablet (100 mg total) by mouth daily. 30 tablet 3   amLODipine  (NORVASC ) 10 MG tablet Take 1 tablet (10 mg total) by mouth daily. 90 tablet 0   Blood Glucose-BP Monitor (BLOOD GLUCOSE-WRIST BP MONITOR) DEVI 1 Bag by Does not apply route in the morning, at noon, and at bedtime. 1 Bag 0   Blood Pressure Monitoring (BLOOD PRESSURE MONITOR AUTOMAT) DEVI 1 kit by Does not apply route in the morning, at noon, and at bedtime. 1 each 0   ondansetron  (ZOFRAN ) 4 MG tablet Take 1 tablet (4 mg total) by mouth every 8 (eight) hours as needed for nausea or vomiting. (Patient not taking: Reported on 08/24/2024) 90 tablet 0   pantoprazole  (PROTONIX ) 40 MG tablet Take 2 tablets (80 mg total) by mouth daily. 60 tablet 1   polyethylene glycol (MIRALAX ) 17 g packet Take 17 g by mouth daily. 14 each 0     Allergies  Allergen Reactions   Ativan  [Lorazepam ] Anxiety and Other (See Comments)    Hallucinations   Nsaids Other (See Comments)    Hx of esophageal ulcer      family history includes CAD in an other family member; Diabetes Mellitus II in his father and another family member. Past Surgical History:  Procedure Laterality Date   BIOPSY  11/12/2022   Procedure: BIOPSY;  Surgeon: Saintclair Jasper, MD;  Location: [facility];  Service: Gastroenterology;;   ESOPHAGOGASTRODUODENOSCOPY N/A 04/12/2024   Procedure: EGD (ESOPHAGOGASTRODUODENOSCOPY);  Surgeon: Saintclair Jasper, MD;  Location: [facility];  Service: Gastroenterology;  Laterality: N/A;   ESOPHAGOGASTRODUODENOSCOPY (EGD) WITH PROPOFOL  N/A 08/14/2019   Procedure: ESOPHAGOGASTRODUODENOSCOPY (EGD) WITH PROPOFOL ;  Surgeon: Dianna Specking, MD;  Location: [facility];  Service: Endoscopy;  Laterality: N/A;   ESOPHAGOGASTRODUODENOSCOPY (EGD) WITH PROPOFOL  N/A 11/12/2022   Procedure: ESOPHAGOGASTRODUODENOSCOPY (EGD) WITH PROPOFOL ;  Surgeon: Saintclair Jasper, MD;  Location: [facility];  Service: Gastroenterology;   Laterality: N/A;   EXTERNAL FIXATION LEG Left 11/07/2018   Procedure: EXTERNAL FIXATION LEFT LOWER LEG;  Surgeon: Fidel Rogue, MD;  Location: [facility];  Service: Orthopedics;  Laterality: Left;   EXTERNAL FIXATION REMOVAL Left 11/08/2018   Procedure: REMOVAL EXTERNAL FIXATION LEG;  Surgeon: Kendal Franky SQUIBB, MD;  Location: [facility];  Service: Orthopedics;  Laterality: Left;   INTRAMEDULLARY (IM) NAIL INTERTROCHANTERIC Right 03/26/2019   Procedure: INTRAMEDULLARY (IM) NAIL INTERTROCHANTRIC;  Surgeon: Kendal Franky SQUIBB, MD;  Location: [facility];  Service: Orthopedics;  Laterality: Right;   INTRAMEDULLARY (IM) NAIL INTERTROCHANTERIC Right 05/17/2019   Procedure: Intramedullary (Im) Nail Intertroch with circlage wiring;  Surgeon: Celena Sharper, MD;  Location: [facility];  Service: Orthopedics;  Laterality: Right;   NO PAST SURGERIES     OPEN REDUCTION INTERNAL FIXATION (ORIF) TIBIA/FIBULA FRACTURE Left 11/08/2018   Procedure: OPEN REDUCTION INTERNAL FIXATION (ORIF) TIBIA/FIBULA FRACTURE;  Surgeon: Kendal Franky SQUIBB, MD;  Location: [facility];  Service: Orthopedics;  Laterality: Left;   ORIF FEMUR FRACTURE Right 05/17/2019   Procedure: REMOVAL  OF HARDWARE;  Surgeon: Celena Sharper, MD;  Location: [facility];  Service: Orthopedics;  Laterality: Right;          Objective Findings:  Vitals:   08/24/24 2007  BP: (!) 154/96  Pulse: (!) 105  Resp: 17  Temp: 98.1 F (36.7 C)  SpO2: 100%   No intake or output data in the 24 hours ending 08/24/24 2227 There were no vitals filed for this visit.  Examination:  Physical Exam Constitutional:      General: He is not in acute distress. Cardiovascular:     Rate and Rhythm: Normal rate and regular rhythm.     Comments: No LE edema Pulmonary:     Effort: Pulmonary effort is normal.     Breath sounds: Normal breath sounds.  Abdominal:     General: Bowel sounds are normal. There is no distension.     Palpations: Abdomen is soft.     Tenderness: There is no abdominal  tenderness.  Skin:    General: Skin is warm and dry.  Neurological:     General: No focal deficit present.     Mental Status: He is alert and oriented to person, place, and time.  Psychiatric:        Mood and Affect: Mood normal.        Behavior: Behavior normal.          Scheduled Medications:    Continuous Infusions:   PRN Medications:    Antimicrobials:  Anti-infectives (From admission, onward)    None           Data Reviewed: I have personally reviewed following labs and imaging studies  CBC: Recent Labs  Lab 08/20/24 1432 08/21/24 0525 08/22/24 0433 08/24/24 2047 08/24/24 2052  WBC 6.8 6.6 5.7 10.4  --   HGB 13.9 12.6* 12.1* 14.7 16.7  HCT 39.5 36.8* 36.5* 42.9 49.0  MCV 84.4 85.8 86.1 85.6  --   PLT 245 208 229 404*  --    Basic Metabolic Panel: Recent Labs  Lab 08/20/24 1432 08/21/24 0525 08/21/24 1917 08/22/24 0433 08/24/24 2047 08/24/24 2052  NA 131* 133*  --  133* 126* 125*  K 3.6 3.6 3.2* 3.0* 4.0 3.9  CL 81* 88*  --  89* 80* 83*  CO2 23 26  --  29 26  --   GLUCOSE 280* 129*  --  114* 262* 259*  BUN 24* 27*  --  19 30* 31*  CREATININE 3.25* 1.99*  --  1.01 4.08* 4.70*  CALCIUM  10.4* 9.3  --  8.8* 10.3  --   MG  --   --   --  1.6*  --   --   PHOS  --   --   --  2.8  --   --    GFR: Estimated Creatinine Clearance: 25.1 mL/min (A) (by C-G formula based on SCr of 4.7 mg/dL (H)). Liver Function Tests: Recent Labs  Lab 08/20/24 1432 08/22/24 0433 08/24/24 2047  AST 151* 98* 43*  ALT 107* 83* 63*  ALKPHOS 85 63 88  BILITOT 1.0 0.9 0.8  PROT 9.6* 7.1 9.7*  ALBUMIN  5.3* 4.1 4.9   Recent Labs  Lab 08/20/24 1432 08/24/24 2047  LIPASE 105* 38   No results for input(s): AMMONIA in the last 168 hours. Coagulation Profile: No results for input(s): INR, PROTIME in the last 168 hours. Cardiac Enzymes: Recent Labs  Lab 08/24/24 2047  CKTOTAL 241   BNP (last 3 results) No results for input(s): PROBNP in the last  8760 hours. HbA1C: No results for input(s): HGBA1C in the last 72 hours. CBG: No results for input(s): GLUCAP in the last 168 hours. Lipid Profile: No results for input(s): CHOL, HDL, LDLCALC, TRIG, CHOLHDL, LDLDIRECT in the last 72 hours. Thyroid  Function Tests: No results for input(s): TSH, T4TOTAL, FREET4, T3FREE, THYROIDAB in the last 72 hours. Anemia Panel: Recent Labs    08/22/24 0433  VITAMINB12 1,002*  FOLATE 19.5  FERRITIN 1,031*  TIBC 238*  IRON 42*   Most Recent Urinalysis On File:     Component Value Date/Time   COLORURINE AMBER (A) 08/21/2024 0030   APPEARANCEUR CLOUDY (A) 08/21/2024 0030   LABSPEC 1.023 08/21/2024 0030   PHURINE 5.0 08/21/2024 0030   GLUCOSEU NEGATIVE 08/21/2024 0030   HGBUR SMALL (A) 08/21/2024 0030   BILIRUBINUR SMALL (A) 08/21/2024 0030   KETONESUR NEGATIVE 08/21/2024 0030   PROTEINUR 100 (A) 08/21/2024 0030   UROBILINOGEN 1.0 07/29/2014 1837   NITRITE NEGATIVE 08/21/2024 0030   LEUKOCYTESUR NEGATIVE 08/21/2024 0030   Sepsis Labs: @LABRCNTIP (procalcitonin:4,lacticidven:4)  No results found for this or any previous visit (from the past 240 hours).       Radiology Studies: US  Renal Result Date: 08/24/2024 CLINICAL DATA:  Acute kidney injury EXAM: RENAL / URINARY TRACT ULTRASOUND COMPLETE COMPARISON:  CT 08/24/2023 FINDINGS: Right Kidney: Renal measurements: 11.2 x 5.3 x 4.9 cm = volume: 150 mL. Echogenicity within normal limits. No mass or hydronephrosis visualized. Left Kidney: Renal measurements: 10.6 x 7.4 x 4.5 cm = volume: 186 mL.  Small Bladder: Appears normal for degree of bladder distention. Other: None. IMPRESSION: Negative renal ultrasound. Electronically Signed   By: Luke Bun M.D.   On: 08/24/2024 22:09             LOS: 0 days       Chiamaka Latka, DO [facility]  08/24/2024, 10:27 PM    Dictation software may have been used to generate the above note. Typos may  occur and escape review in typed/dictated notes. Please contact Dr Marsa directly for clarity if needed.  Staff may message me via secure chat in Epic  but this may not receive an immediate response,  please page me for urgent matters!  If 7PM-7AM, please contact night coverage [email or web address]

## 2024-08-25 ENCOUNTER — Encounter (HOSPITAL_COMMUNITY): Payer: Self-pay | Admitting: Osteopathic Medicine

## 2024-08-25 DIAGNOSIS — Z91148 Patient's other noncompliance with medication regimen for other reason: Secondary | ICD-10-CM

## 2024-08-25 DIAGNOSIS — R1116 Cannabis hyperemesis syndrome: Secondary | ICD-10-CM

## 2024-08-25 DIAGNOSIS — F1910 Other psychoactive substance abuse, uncomplicated: Secondary | ICD-10-CM

## 2024-08-25 DIAGNOSIS — N179 Acute kidney failure, unspecified: Secondary | ICD-10-CM | POA: Diagnosis not present

## 2024-08-25 LAB — BASIC METABOLIC PANEL WITH GFR
Anion gap: 15 (ref 5–15)
Anion gap: 17 — ABNORMAL HIGH (ref 5–15)
BUN: 31 mg/dL — ABNORMAL HIGH (ref 6–20)
BUN: 32 mg/dL — ABNORMAL HIGH (ref 6–20)
CO2: 28 mmol/L (ref 22–32)
CO2: 26 mmol/L (ref 22–32)
Calcium: 9.5 mg/dL (ref 8.9–10.3)
Calcium: 9.5 mg/dL (ref 8.9–10.3)
Chloride: 86 mmol/L — ABNORMAL LOW (ref 98–111)
Chloride: 86 mmol/L — ABNORMAL LOW (ref 98–111)
Creatinine, Ser: 3.24 mg/dL — ABNORMAL HIGH (ref 0.61–1.24)
Creatinine, Ser: 2.88 mg/dL — ABNORMAL HIGH (ref 0.61–1.24)
GFR, Estimated: 23 mL/min — ABNORMAL LOW (ref >60)
GFR, Estimated: 26 mL/min — ABNORMAL LOW (ref >60)
Glucose, Bld: 172 mg/dL — ABNORMAL HIGH (ref 70–99)
Glucose, Bld: 160 mg/dL — ABNORMAL HIGH (ref 70–99)
Potassium: 3.8 mmol/L (ref 3.5–5.1)
Potassium: 3.7 mmol/L (ref 3.5–5.1)
Sodium: 129 mmol/L — ABNORMAL LOW (ref 135–145)
Sodium: 129 mmol/L — ABNORMAL LOW (ref 135–145)

## 2024-08-25 LAB — URINALYSIS, ROUTINE W REFLEX MICROSCOPIC
Bacteria, UA: NONE SEEN
Bilirubin Urine: NEGATIVE
Glucose, UA: NEGATIVE mg/dL
Hgb urine dipstick: NEGATIVE
Ketones, ur: NEGATIVE mg/dL
Leukocytes,Ua: NEGATIVE
Nitrite: NEGATIVE
Protein, ur: 100 mg/dL — AB
Specific Gravity, Urine: 1.021 (ref 1.005–1.030)
pH: 5 (ref 5.0–8.0)

## 2024-08-25 LAB — URINE DRUG SCREEN
Amphetamines: NEGATIVE
Barbiturates: NEGATIVE
Benzodiazepines: NEGATIVE
Cocaine: NEGATIVE
Fentanyl: NEGATIVE
Methadone Scn, Ur: NEGATIVE
Opiates: NEGATIVE
Tetrahydrocannabinol: POSITIVE — AB

## 2024-08-25 LAB — CBG MONITORING, ED
Glucose-Capillary: 123 mg/dL — ABNORMAL HIGH (ref 70–99)
Glucose-Capillary: 132 mg/dL — ABNORMAL HIGH (ref 70–99)
Glucose-Capillary: 186 mg/dL — ABNORMAL HIGH (ref 70–99)

## 2024-08-25 LAB — GLUCOSE, CAPILLARY
Glucose-Capillary: 165 mg/dL — ABNORMAL HIGH (ref 70–99)
Glucose-Capillary: 159 mg/dL — ABNORMAL HIGH (ref 70–99)

## 2024-08-25 LAB — HEMOGLOBIN A1C
Hgb A1c MFr Bld: 6.1 % — ABNORMAL HIGH (ref 4.8–5.6)
Mean Plasma Glucose: 128.37 mg/dL

## 2024-08-25 LAB — TROPONIN T, HIGH SENSITIVITY: Troponin T High Sensitivity: 20 ng/L — ABNORMAL HIGH (ref 0–19)

## 2024-08-25 MED ORDER — SODIUM CHLORIDE 0.9 % IV SOLN: INTRAVENOUS | Status: DC

## 2024-08-25 NOTE — Discharge Summary (Signed)
 Physician Discharge Summary   Patient: Richard Lopez MRN: [id number] DOB: [birth year]  Admit date:     08/20/2024  Discharge date: 08/22/2024  Discharge Physician: Alban Pepper   PCP: Celestia Rosaline SQUIBB, NP   Recommendations at discharge:   Repeat bmp to ensure your serum creatinine is normalizing   Discharge Diagnoses: Principal Problem:   Acute renal failure (ARF) Active Problems:   Cannabinoid hyperemesis syndrome   Essential hypertension   Prediabetes   AF (paroxysmal atrial fibrillation) (HCC)   Intractable nausea and vomiting  Resolved Problems:   * No resolved hospital problems. *  Hospital Course: H&P HPI: Richard Lopez is a 48 y.o. male with medical history significant for cannabis abuse, alcohol abuse, esophageal ulcers and esophagitis, hypertension, and history of PE who presents because of progressive dizziness and weakness and intractable vomiting/dry heaving for the last 4 days.  The patient states he last used marijuana 7 days ago.  The patient was seen in the emergency department 4 days ago because he fell and he was feeling very dizzy.  He also complained of back pain.  His workup at that time included a CT of his L-spine.  He was found to have multiple bulging disks, no cord compression.  He was discharged from the emergency department and told to hydrate orally but the patient has not been able to keep down liquids.  Even when nothing comes up he is dry heaving almost continuously.  He does have some mild abdominal pain.  No diarrhea or fevers.  He recognizes that this could be because of his cannabis use 7 days ago that he has been unable to stop the dry heaving.  He is extremely dizzy so he came back in for evaluation. In the emergency department he received 1 L of normal saline.  The hospitalist were called to admit him when his blood work revealed a creatinine of 3.25.  4 days ago his creatinine was 0.89 The patient has not been able to urinate to produce a  urine sample. The patient said he had an EGD 3 months ago and it looked good.  He has not had this trouble with recurrent vomiting in the last 3 months.  Assessment and Plan: AKI d/t hypovolemia  Due to poor oral intake in the setting of N/V. By the time of discharge patient was tolerating solid food. And His serum creatinine had normalized.    Dizziness Patient was orthostatic in the setting of hypovolemia. He walked the unit before discharge without any symptoms.   Hyponatremia  Improved with IV fluids   Hypokalemia Hypomag Replaced before discharge. Will send home with PO potassium. He will need repeat BMP outpatient with his PCP.   Mild normocytic anemia  Hgb stable. Mild Fe deficiency noted.  Will send home with po iron and bowel regimen.    P afib not on AC  ChADSvasc 0. Currently in NSR.    Alcohol use disorder  Given counseling.    Elevated transaminases  In the setting of cocaine and alcohol use. Down trending.  Counseled on cessaiton.    Cocaine and THC use  TOC counsel on cessation.  HTN His home blood pressure medicine was resumed.        Consultants: None Procedures performed: None  Disposition: Home Diet recommendation:  Discharge Diet Orders (From admission, onward)     Start     Ordered   08/22/24 0000  Diet - low sodium heart healthy  08/22/24 1311           Regular diet DISCHARGE MEDICATION: Allergies as of 08/22/2024       Reactions   Ativan  [lorazepam ] Anxiety, Other (See Comments)   Hallucinations   Nsaids Other (See Comments)   Hx of esophageal ulcer        Medication List     STOP taking these medications    Potassium 99 MG Tabs   predniSONE  10 MG tablet Commonly known as: DELTASONE    sucralfate  1 GM/10ML suspension Commonly known as: CARAFATE        TAKE these medications    amLODipine  10 MG tablet Commonly known as: NORVASC  Take 1 tablet (10 mg total) by mouth daily.   Blood Glucose-Wrist BP  Monitor Devi 1 Bag by Does not apply route in the morning, at noon, and at bedtime.   Blood Pressure Monitor Automat Devi 1 kit by Does not apply route in the morning, at noon, and at bedtime.   ferrous sulfate 325 (65 FE) MG tablet Take 1 tablet (325 mg total) by mouth daily with breakfast.   multivitamin with minerals Tabs tablet Take 1 tablet by mouth daily.   ondansetron  4 MG tablet Commonly known as: Zofran  Take 1 tablet (4 mg total) by mouth every 8 (eight) hours as needed for nausea or vomiting.   pantoprazole  40 MG tablet Commonly known as: Protonix  Take 2 tablets (80 mg total) by mouth daily.   polyethylene glycol 17 g packet Commonly known as: MiraLax  Take 17 g by mouth daily.   potassium chloride  SA 20 MEQ tablet Commonly known as: KLOR-CON  M Take 1 tablet (20 mEq total) by mouth daily.   thiamine  100 MG tablet Commonly known as: Vitamin B-1 Take 1 tablet (100 mg total) by mouth daily.        Follow-up Information     Celestia Rosaline SQUIBB, NP. Schedule an appointment as soon as possible for a visit.   Specialty: Internal Medicine Contact information: [address] [place] [address] [phone]                Discharge Exam: Richard Lopez   08/20/24 2316  Weight: 96 kg   Physical Exam  Constitutional: In no distress.  Cardiovascular: Normal rate, regular rhythm. No lower extremity edema  Pulmonary: Non labored breathing on room air, no wheezing or rales.   Abdominal: Soft. Non distended and non tender Musculoskeletal: Normal range of motion.     Neurological: Alert and oriented to person, place, and time. Non focal  Skin: Skin is warm and dry.    Condition at discharge: improving  The results of significant diagnostics from this hospitalization (including imaging, microbiology, ancillary and laboratory) are listed below for reference.   Imaging Studies: US  Renal Result Date: 08/24/2024 CLINICAL DATA:  Acute kidney injury  EXAM: RENAL / URINARY TRACT ULTRASOUND COMPLETE COMPARISON:  CT 08/24/2023 FINDINGS: Right Kidney: Renal measurements: 11.2 x 5.3 x 4.9 cm = volume: 150 mL. Echogenicity within normal limits. No mass or hydronephrosis visualized. Left Kidney: Renal measurements: 10.6 x 7.4 x 4.5 cm = volume: 186 mL.  Small Bladder: Appears normal for degree of bladder distention. Other: None. IMPRESSION: Negative renal ultrasound. Electronically Signed   By: Luke Bun M.D.   On: 08/24/2024 22:09   DG ABD ACUTE 2+V W 1V CHEST Result Date: 08/20/2024 CLINICAL DATA:  Intractable nausea and vomiting. EXAM: DG ABDOMEN ACUTE WITH 1 VIEW CHEST COMPARISON:  Radiograph 03/16/2024 FINDINGS: The cardiomediastinal contours are  normal. The lungs are clear. There is no free intra-abdominal air. No dilated bowel loops to suggest obstruction. Small volume of formed stool throughout the colon. No radiopaque calculi. Surgical hardware in the right proximal femur is partially included. No acute osseous abnormalities are seen. IMPRESSION: Negative abdominal radiographs.  No acute cardiopulmonary disease. Electronically Signed   By: Andrea Gasman M.D.   On: 08/20/2024 18:04   CT Lumbar Spine Wo Contrast Result Date: 08/16/2024 CLINICAL DATA:  Back trauma, no prior imaging (Age >= 16y). Weakness. EXAM: CT LUMBAR SPINE WITHOUT CONTRAST TECHNIQUE: Multidetector CT imaging of the lumbar spine was performed without intravenous contrast administration. Multiplanar CT image reconstructions were also generated. RADIATION DOSE REDUCTION: This exam was performed according to the departmental dose-optimization program which includes automated exposure control, adjustment of the mA and/or kV according to patient size and/or use of iterative reconstruction technique. COMPARISON:  None Available. FINDINGS: Segmentation: 5 lumbar type vertebrae. Alignment: Normal. Vertebrae: No fracture or focal bone lesion. Paraspinal and other soft tissues: Negative  Disc levels: Diffuse degenerative spurring anteriorly and laterally. Bilateral degenerative facet disease, most pronounced in the lower lumbar spine. Disc spaces maintained. Broad-based disc bulges at L3-4 through L5-S1. Mild central canal stenosis at L3-4 and L4-5 due to disc bulges and facet disease. IMPRESSION: No acute bony abnormality. 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 Electronically Signed   By: Franky Crease M.D.   On: 08/16/2024 18:18   DG Pelvis Portable Result Date: 08/16/2024 CLINICAL DATA:  Lower right-sided to middle of the back pain status post recent fall. EXAM: PORTABLE PELVIS 1-2 VIEWS COMPARISON:  May 16, 2019 FINDINGS: There is no evidence of an acute pelvic fracture or diastasis. A radiopaque intramedullary rod and compression screw device is seen within the proximal right femur. Chronic deformities of the greater and lesser trochanters of the proximal right femur are also noted. These are present on the prior study. No pelvic bone lesions are seen. IMPRESSION: 1. No acute pelvic fracture or diastasis. 2. Prior ORIF of the proximal right femur. Electronically Signed   By: Suzen Dials M.D.   On: 08/16/2024 17:56    Microbiology: Results for orders placed or performed during the hospital encounter of 04/10/24  Resp panel by RT-PCR (RSV, Flu A&B, Covid) Anterior Nasal Swab     Status: None   Collection Time: 04/11/24  4:44 AM   Specimen: Anterior Nasal Swab  Result Value Ref Range Status   SARS Coronavirus 2 by RT PCR NEGATIVE NEGATIVE Final    Comment: (NOTE) SARS-CoV-2 target nucleic acids are NOT DETECTED.  The SARS-CoV-2 RNA is generally detectable in upper respiratory specimens during the acute phase of infection. The lowest concentration of SARS-CoV-2 viral copies this assay can detect is 138 copies/mL. A negative result does not preclude SARS-Cov-2 infection and should not be used as the  sole basis for treatment or other patient management decisions. A negative result may occur with  improper specimen collection/handling, submission of specimen other than nasopharyngeal swab, presence of viral mutation(s) within the areas targeted by this assay, and inadequate number of viral copies(<138 copies/mL). A negative result must be combined with clinical observations, patient history, and epidemiological information. The expected result is Negative.  Fact Sheet for Patients:  BloggerCourse.com  Fact Sheet for Healthcare Providers:  SeriousBroker.it  This test is no t yet approved or cleared by the [place]  FDA and  has been authorized for detection and/or  diagnosis of SARS-CoV-2 by FDA under an Emergency Use Authorization (EUA). This EUA will remain  in effect (meaning this test can be used) for the duration of the COVID-19 declaration under Section 564(b)(1) of the Act, 21 U.S.C.section 360bbb-3(b)(1), unless the authorization is terminated  or revoked sooner.       Influenza A by PCR NEGATIVE NEGATIVE Final   Influenza B by PCR NEGATIVE NEGATIVE Final    Comment: (NOTE) The Xpert Xpress SARS-CoV-2/FLU/RSV plus assay is intended as an aid in the diagnosis of influenza from Nasopharyngeal swab specimens and should not be used as a sole basis for treatment. Nasal washings and aspirates are unacceptable for Xpert Xpress SARS-CoV-2/FLU/RSV testing.  Fact Sheet for Patients: BloggerCourse.com  Fact Sheet for Healthcare Providers: SeriousBroker.it  This test is not yet approved or cleared by the [place]  FDA and has been authorized for detection and/or diagnosis of SARS-CoV-2 by FDA under an Emergency Use Authorization (EUA). This EUA will remain in effect (meaning this test can be used) for the duration of the COVID-19 declaration under Section 564(b)(1) of the  Act, 21 U.S.C. section 360bbb-3(b)(1), unless the authorization is terminated or revoked.     Resp Syncytial Virus by PCR NEGATIVE NEGATIVE Final    Comment: (NOTE) Fact Sheet for Patients: BloggerCourse.com  Fact Sheet for Healthcare Providers: SeriousBroker.it  This test is not yet approved or cleared by the [place]  FDA and has been authorized for detection and/or diagnosis of SARS-CoV-2 by FDA under an Emergency Use Authorization (EUA). This EUA will remain in effect (meaning this test can be used) for the duration of the COVID-19 declaration under Section 564(b)(1) of the Act, 21 U.S.C. section 360bbb-3(b)(1), unless the authorization is terminated or revoked.  Performed at [facility], [address] [place], [place] [address]     Labs: CBC: Recent Labs  Lab 08/20/24 1432 08/21/24 0525 08/22/24 0433 08/24/24 2047 08/24/24 2052  WBC 6.8 6.6 5.7 10.4  --   HGB 13.9 12.6* 12.1* 14.7 16.7  HCT 39.5 36.8* 36.5* 42.9 49.0  MCV 84.4 85.8 86.1 85.6  --   PLT 245 208 229 404*  --    Basic Metabolic Panel: Recent Labs  Lab 08/21/24 0525 08/21/24 1917 08/22/24 0433 08/24/24 2047 08/24/24 2052 08/25/24 0235 08/25/24 0609  NA 133*  --  133* 126* 125* 129* 129*  K 3.6   < > 3.0* 4.0 3.9 3.8 3.7  CL 88*  --  89* 80* 83* 86* 86*  CO2 26  --  29 26  --  28 26  GLUCOSE 129*  --  114* 262* 259* 172* 160*  BUN 27*  --  19 30* 31* 31* 32*  CREATININE 1.99*  --  1.01 4.08* 4.70* 3.24* 2.88*  CALCIUM  9.3  --  8.8* 10.3  --  9.5 9.5  MG  --   --  1.6*  --   --   --   --   PHOS  --   --  2.8  --   --   --   --    < > = values in this interval not displayed.   Liver Function Tests: Recent Labs  Lab 08/20/24 1432 08/22/24 0433 08/24/24 2047  AST 151* 98* 43*  ALT 107* 83* 63*  ALKPHOS 85 63 88  BILITOT 1.0 0.9 0.8  PROT 9.6* 7.1 9.7*  ALBUMIN  5.3* 4.1 4.9   CBG: Recent Labs  Lab  08/25/24 0018 08/25/24 0813 08/25/24 1151  GLUCAP 186* 132* 123*    Discharge time spent: greater than 30 minutes.  Signed: Alban Pepper, MD [facility] 08/25/2024

## 2024-08-25 NOTE — Progress Notes (Addendum)
 PROGRESS NOTE    [name]  FMW:[id number] DOB: Oct 26, 1976 DOA: 08/24/2024 PCP: Celestia Rosaline SQUIBB, NP   Brief Narrative:  Richard Lopez is a 48 y.o. male who presents back to our facility for the third time third time in less than 2 weeks for recurrent episodes of intractable non-intractable nausea vomiting with with previous documentation and imaging with patient and imaging  patient and imaging significant for esophagitis.  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  Hospitalist called for admission for continued to continue supportive care and ongoing management.  Patient has known history of gastritis/esophagitis, cannabis use, amphetamine use, alcohol use, HTN, history of PE completed anticoagulation course. Concern for cannabinoid hyperemesis - UDS is consistently positive for THC over the past 2 years and occasionally positive for other symptoms as well.   Recent admission 08/20/24 and discharge 08/22/24 with nausea/vomiting leading to low p.o. intake, AKI, hypovolemia, hyponatremia.  History of alcohol use disorder, UDS positive for cocaine and THC upon admission.  Discharge summary not available at this time but labs on day of discharge note sodium 133, chloride 89 -levels appear stable/chronic.  Creatinine 1.01, of note was 3.25 on admission 08/20/24, liver enzymes trending down. Of note - EGD 04/12/24 noted esophagitis without bleeding, few superficial esophageal ulcers, hiatal hernia, localized severely erythematous mucosa without bleeding in gastric cardia, likely related to retching, biopsies taken for H. pylori, gastritis elsewhere, normal duodenum and biopsies taken for celiac disease.  Recommendation at that time for PPI twice daily x 2 months, sucralfate  4 times daily x 1 month, avoid marijuana and alcohol use.  This was a follow-up EGD, had 1 earlier that year 11/13/23 noting severe esophagitis, many  superficial esophageal ulcers, again significant gastritis    Assessment & Plan:   Principal Problem:   Renal failure   Intractable nausea and vomiting - secondary to recurrent/ongoing cannabinoid hyperemesis syndrome - Lengthy discussion in regards to cessation - Continue supportive care - No narcotics at this time given concerning UDS/polysubstance abuse  Acute renal failure secondary to poor p.o. intake Reduced urine output Renal ultrasound negative -Secondary to above -Continue IV fluids until PO intake appropriate   Hyponatremia Likely hypovolemic / renal failure Correct underlying disorders as noted Trend BMP 02:00 and 06:00 - Close monitor sodium to prevent overcorrection, goal for 134 in 24h (can probably correct faster since this appears acute but will have goal 8 no more than 10 / 24h correction)     History severe gastritis, esophagitis Likely related to persistent vomiting No evidence for bleeding at this time IV PPI ongoing - H/H WNL   Hyperglycemia   Lab Results  Component Value Date   HGBA1C 5.7 (H) 12/11/2023  SSI achs renal dose, titrate as needed based on Glc / insulin  requirement    History alcohol abuse Abstinence stressed to patient   UDS positive cocaine/amphetamine and THC last admission, admitted continued cannabis use  Abstinence stressed to patient   Hx Paroxysmal Afib currently in sinus Hx PE completed anticoagulation for provoked VTE  Monitor Telemetry can DC given no acute events  Chronic gout/tophi (multiple) noted throughout, POA          DVT prophylaxis: heparin  injection 5,000 Units Start: 08/25/24 0600 Code Status:   Code Status: Full Code Family Communication: None present  Status is: Inpatient  Dispo: The patient is from: Home              Anticipated d/c  is to: Home              Anticipated d/c date is: 24 to 48 hours              Patient currently not medically stable for discharge  Consultants:   None  Procedures:  None  Antimicrobials:  None indicated  Subjective: No acute issues or events overnight is overnight, symptoms improving but not yet back to baseline, continues to have difficulty tolerating p.o. tolerating p.o. appropriately  Objective: Vitals:   08/25/24 0028 08/25/24 0345 08/25/24 0600 08/25/24 0610  BP: (!) 164/96 (!) 135/94 (!) 160/83   Pulse: 96 (!) 102 92   Resp: (!) 25 (!) 24 17   Temp: 97.6 F (36.4 C)   97.6 F (36.4 C)  TempSrc: Oral   Oral  SpO2: 94% 100% 100%    No intake or output data in the 24 hours ending 08/25/24 0805 There were no vitals filed for this visit.  Examination:  General:  Pleasantly resting in bed, No acute distress. HEENT:  Normocephalic atraumatic.  Sclerae nonicteric, noninjected.  Extraocular movements intact bilaterally. Neck:  Without mass or deformity.  Trachea is midline. Lungs:  Clear to auscultate bilaterally without rhonchi, wheeze, or rales. Heart:  Regular rate and rhythm.  Without murmurs, rubs, or gallops. Abdomen:  Soft, nontender, nondistended.  Without guarding or rebound. Extremities: Without cyanosis, clubbing, edema, or obvious deformity. Skin:  Warm and dry, no erythema.  Data Reviewed: I have personally reviewed following labs and imaging studies  CBC: Recent Labs  Lab 08/20/24 1432 08/21/24 0525 08/22/24 0433 08/24/24 2047 08/24/24 2052  WBC 6.8 6.6 5.7 10.4  --   HGB 13.9 12.6* 12.1* 14.7 16.7  HCT 39.5 36.8* 36.5* 42.9 49.0  MCV 84.4 85.8 86.1 85.6  --   PLT 245 208 229 404*  --    Basic Metabolic Panel: Recent Labs  Lab 08/21/24 0525 08/21/24 1917 08/22/24 0433 08/24/24 2047 08/24/24 2052 08/25/24 0235 08/25/24 0609  NA 133*  --  133* 126* 125* 129* 129*  K 3.6   < > 3.0* 4.0 3.9 3.8 3.7  CL 88*  --  89* 80* 83* 86* 86*  CO2 26  --  29 26  --  28 26  GLUCOSE 129*  --  114* 262* 259* 172* 160*  BUN 27*  --  19 30* 31* 31* 32*  CREATININE 1.99*  --  1.01 4.08* 4.70* 3.24*  2.88*  CALCIUM  9.3  --  8.8* 10.3  --  9.5 9.5  MG  --   --  1.6*  --   --   --   --   PHOS  --   --  2.8  --   --   --   --    < > = values in this interval not displayed.   GFR: Estimated Creatinine Clearance: 41 mL/min (A) (by C-G formula based on SCr of 2.88 mg/dL (H)). Liver Function Tests: Recent Labs  Lab 08/20/24 1432 08/22/24 0433 08/24/24 2047  AST 151* 98* 43*  ALT 107* 83* 63*  ALKPHOS 85 63 88  BILITOT 1.0 0.9 0.8  PROT 9.6* 7.1 9.7*  ALBUMIN  5.3* 4.1 4.9   Recent Labs  Lab 08/20/24 1432 08/24/24 2047  LIPASE 105* 38   No results for input(s): AMMONIA in the last 168 hours. Coagulation Profile: No results for input(s): INR, PROTIME in the last 168 hours. Cardiac Enzymes: Recent Labs  Lab 08/24/24 2047  CKTOTAL 241   BNP (last 3 results) No results for input(s): PROBNP in the last 8760 hours. HbA1C: No results for input(s): HGBA1C in the last 72 hours. CBG: Recent Labs  Lab 08/25/24 0018  GLUCAP 186*   Lipid Profile: No results for input(s): CHOL, HDL, LDLCALC, TRIG, CHOLHDL, LDLDIRECT in the last 72 hours. Thyroid  Function Tests: No results for input(s): TSH, T4TOTAL, FREET4, T3FREE, THYROIDAB in the last 72 hours. Anemia Panel: No results for input(s): VITAMINB12, FOLATE, FERRITIN, TIBC, IRON, RETICCTPCT in the last 72 hours. Sepsis Labs: No results for input(s): PROCALCITON, LATICACIDVEN in the last 168 hours.  No results found for this or any previous visit (from the past 240 hours).       Radiology Studies: US  Renal Result Date: 08/24/2024 CLINICAL DATA:  Acute kidney injury EXAM: RENAL / URINARY TRACT ULTRASOUND COMPLETE COMPARISON:  CT 08/24/2023 FINDINGS: Right Kidney: Renal measurements: 11.2 x 5.3 x 4.9 cm = volume: 150 mL. Echogenicity within normal limits. No mass or hydronephrosis visualized. Left Kidney: Renal measurements: 10.6 x 7.4 x 4.5 cm = volume: 186 mL.  Small Bladder:  Appears normal for degree of bladder distention. Other: None. IMPRESSION: Negative renal ultrasound. Electronically Signed   By: Luke Bun M.D.   On: 08/24/2024 22:09        Scheduled Meds:  heparin   5,000 Units Subcutaneous Q8H   insulin  aspart  0-5 Units Subcutaneous QHS   insulin  aspart  0-9 Units Subcutaneous TID WC   pantoprazole  (PROTONIX ) IV  40 mg Intravenous Q12H   Continuous Infusions:  sodium chloride  125 mL/hr at 08/25/24 0028     LOS: 1 day   Time spent:   Elsie JAYSON Montclair, DO [facility]  If 7PM-7AM, please contact night-coverage [email or web address]  08/25/2024, 8:05 AM

## 2024-08-26 DIAGNOSIS — R1116 Cannabis hyperemesis syndrome: Secondary | ICD-10-CM | POA: Diagnosis not present

## 2024-08-26 DIAGNOSIS — N179 Acute kidney failure, unspecified: Secondary | ICD-10-CM | POA: Diagnosis not present

## 2024-08-26 DIAGNOSIS — F1210 Cannabis abuse, uncomplicated: Secondary | ICD-10-CM

## 2024-08-26 LAB — COMPREHENSIVE METABOLIC PANEL WITH GFR
ALT: 40 U/L (ref 0–44)
AST: 37 U/L (ref 15–41)
Albumin: 3.9 g/dL (ref 3.5–5.0)
Alkaline Phosphatase: 69 U/L (ref 38–126)
Anion gap: 14 (ref 5–15)
BUN: 27 mg/dL — ABNORMAL HIGH (ref 6–20)
CO2: 25 mmol/L (ref 22–32)
Calcium: 8.8 mg/dL — ABNORMAL LOW (ref 8.9–10.3)
Chloride: 90 mmol/L — ABNORMAL LOW (ref 98–111)
Creatinine, Ser: 1.35 mg/dL — ABNORMAL HIGH (ref 0.61–1.24)
GFR, Estimated: >60 mL/min (ref >60)
Glucose, Bld: 136 mg/dL — ABNORMAL HIGH (ref 70–99)
Potassium: 3.4 mmol/L — ABNORMAL LOW (ref 3.5–5.1)
Sodium: 129 mmol/L — ABNORMAL LOW (ref 135–145)
Total Bilirubin: 0.6 mg/dL (ref 0.0–1.2)
Total Protein: 7.4 g/dL (ref 6.5–8.1)

## 2024-08-26 LAB — CBC
HCT: 35.1 % — ABNORMAL LOW (ref 39.0–52.0)
Hemoglobin: 11.6 g/dL — ABNORMAL LOW (ref 13.0–17.0)
MCH: 28.4 pg (ref 26.0–34.0)
MCHC: 33 g/dL (ref 30.0–36.0)
MCV: 86 fL (ref 80.0–100.0)
Platelets: 328 K/uL (ref 150–400)
RBC: 4.08 MIL/uL — ABNORMAL LOW (ref 4.22–5.81)
RDW: 15.9 % — ABNORMAL HIGH (ref 11.5–15.5)
WBC: 5.9 K/uL (ref 4.0–10.5)
nRBC: 0 % (ref 0.0–0.2)

## 2024-08-26 LAB — GLUCOSE, CAPILLARY
Glucose-Capillary: 130 mg/dL — ABNORMAL HIGH (ref 70–99)
Glucose-Capillary: 116 mg/dL — ABNORMAL HIGH (ref 70–99)

## 2024-08-26 MED ORDER — ONDANSETRON HCL 4 MG PO TABS: 4.0000 mg | ORAL_TABLET | Freq: Four times a day (QID) | ORAL | 0 refills | Status: AC | PRN

## 2024-08-26 MED ORDER — PANTOPRAZOLE SODIUM 40 MG PO TBEC: 40.0000 mg | DELAYED_RELEASE_TABLET | Freq: Two times a day (BID) | ORAL | 0 refills | Status: DC

## 2024-08-26 NOTE — Plan of Care (Signed)
   Problem: Coping: Goal: Ability to adjust to condition or change in health will improve Outcome: Progressing   Problem: Fluid Volume: Goal: Ability to maintain a balanced intake and output will improve Outcome: Progressing   Problem: Metabolic: Goal: Ability to maintain appropriate glucose levels will improve Outcome: Progressing   Problem: Nutritional: Goal: Maintenance of adequate nutrition will improve Outcome: Progressing   Problem: Tissue Perfusion: Goal: Adequacy of tissue perfusion will improve Outcome: Progressing

## 2024-08-26 NOTE — Discharge Summary (Signed)
 Physician Discharge Summary  Richard Lopez FMW:[id number] DOB: [birth year] DOA: 08/24/2024  PCP: Celestia Rosaline SQUIBB, NP  Admit date: 08/24/2024 Discharge date: 08/26/2024  Admitted From: Home Disposition: Home  Recommendations for Outpatient Follow-up:  Follow up with PCP in 1-2 weeks  Home Health: None Equipment/Devices: None  Discharge Condition: Stable CODE STATUS: Full Diet recommendation: As tolerated low-fat low-salt diet.  Brief/Interim Summary: Richard Lopez is a 48 y.o. male who presents back to our facility for the third time third time in less than 2 weeks for recurrent episodes of intractable non-intractable nausea vomiting with with previous documentation and imaging with patient and imaging  patient and imaging significant for esophagitis.  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  Hospitalist called for admission for continued to continue supportive care and ongoing management.  Patient has well-established history of gastritis/esophagitis, cannabis use, amphetamine use, alcohol use, HTN, history of PE completed anticoagulation course. Concern for cannabinoid hyperemesis - UDS is consistently positive for THC over the past 2 years and occasionally positive for other symptoms as well.  Patient admitted as above with worsening gastritis esophagitis in setting of ongoing cannabis and inability to use complicated by ongoing alcohol use.  Patient also had notable AKI at intake, now recovered after IV fluids and supportive care.  Tolerating p.o. well and otherwise stable and agreeable for discharge home.  Lengthy discussion in regards to cessation of patient's substance use including marijuana, amphetamine and alcohol.  Follow-up with PCP in 1 to 2 weeks as scheduled, follow-up with GI if no improvement in the future despite cessation of above.   Discharge Diagnoses:  Principal Problem:   Renal  failure    Discharge Instructions  Discharge Instructions     Call MD for:  difficulty breathing, headache or visual disturbances   Complete by: As directed    Call MD for:  extreme fatigue   Complete by: As directed    Call MD for:  hives   Complete by: As directed    Call MD for:  persistant dizziness or light-headedness   Complete by: As directed    Call MD for:  persistant nausea and vomiting   Complete by: As directed    Call MD for:  severe uncontrolled pain   Complete by: As directed    Call MD for:  temperature >100.4   Complete by: As directed    Diet - low sodium heart healthy   Complete by: As directed    Increase activity slowly   Complete by: As directed       Allergies as of 08/26/2024       Reactions   Ativan  [lorazepam ] Anxiety, Other (See Comments)   Hallucinations   Nsaids Other (See Comments)   Hx of esophageal ulcer        Medication List     TAKE these medications    amLODipine  10 MG tablet Commonly known as: NORVASC  Take 10 mg by mouth daily. What changed: Another medication with the same name was removed. Continue taking this medication, and follow the directions you see here.   Blood Glucose-Wrist BP Monitor Devi 1 Bag by Does not apply route in the morning, at noon, and at bedtime.   Blood Pressure Monitor Automat Devi 1 kit by Does not apply route in the morning, at noon, and at bedtime.   ferrous sulfate 325 (65 FE) MG tablet Take 1 tablet (325 mg total) by mouth daily with breakfast.  multivitamin with minerals Tabs tablet Take 1 tablet by mouth daily.   ondansetron  4 MG tablet Commonly known as: ZOFRAN  Take 1 tablet (4 mg total) by mouth every 6 (six) hours as needed for nausea. What changed:  when to take this reasons to take this   pantoprazole  40 MG tablet Commonly known as: Protonix  Take 1 tablet (40 mg total) by mouth 2 (two) times daily before a meal. What changed:  how much to take when to take  this Another medication with the same name was removed. Continue taking this medication, and follow the directions you see here.   polyethylene glycol 17 g packet Commonly known as: MiraLax  Take 17 g by mouth daily.   potassium chloride  SA 20 MEQ tablet Commonly known as: KLOR-CON  M Take 1 tablet (20 mEq total) by mouth daily.   sucralfate  1 GM/10ML suspension Commonly known as: CARAFATE  Take 1 g by mouth 4 (four) times daily.   thiamine  100 MG tablet Commonly known as: Vitamin B-1 Take 1 tablet (100 mg total) by mouth daily.        Allergies  Allergen Reactions   Ativan  [Lorazepam ] Anxiety and Other (See Comments)    Hallucinations   Nsaids Other (See Comments)    Hx of esophageal ulcer    Consultations: None   Procedures/Studies: US  Renal Result Date: 08/24/2024 CLINICAL DATA:  Acute kidney injury EXAM: RENAL / URINARY TRACT ULTRASOUND COMPLETE COMPARISON:  CT 08/24/2023 FINDINGS: Right Kidney: Renal measurements: 11.2 x 5.3 x 4.9 cm = volume: 150 mL. Echogenicity within normal limits. No mass or hydronephrosis visualized. Left Kidney: Renal measurements: 10.6 x 7.4 x 4.5 cm = volume: 186 mL.  Small Bladder: Appears normal for degree of bladder distention. Other: None. IMPRESSION: Negative renal ultrasound. Electronically Signed   By: Luke Bun M.D.   On: 08/24/2024 22:09   DG ABD ACUTE 2+V W 1V CHEST Result Date: 08/20/2024 CLINICAL DATA:  Intractable nausea and vomiting. EXAM: DG ABDOMEN ACUTE WITH 1 VIEW CHEST COMPARISON:  Radiograph 03/16/2024 FINDINGS: The cardiomediastinal contours are normal. The lungs are clear. There is no free intra-abdominal air. No dilated bowel loops to suggest obstruction. Small volume of formed stool throughout the colon. No radiopaque calculi. Surgical hardware in the right proximal femur is partially included. No acute osseous abnormalities are seen. IMPRESSION: Negative abdominal radiographs.  No acute cardiopulmonary disease.  Electronically Signed   By: Andrea Gasman M.D.   On: 08/20/2024 18:04   CT Lumbar Spine Wo Contrast Result Date: 08/16/2024 CLINICAL DATA:  Back trauma, no prior imaging (Age >= 16y). Weakness. EXAM: CT LUMBAR SPINE WITHOUT CONTRAST TECHNIQUE: Multidetector CT imaging of the lumbar spine was performed without intravenous contrast administration. Multiplanar CT image reconstructions were also generated. RADIATION DOSE REDUCTION: This exam was performed according to the departmental dose-optimization program which includes automated exposure control, adjustment of the mA and/or kV according to patient size and/or use of iterative reconstruction technique. COMPARISON:  None Available. FINDINGS: Segmentation: 5 lumbar type vertebrae. Alignment: Normal. Vertebrae: No fracture or focal bone lesion. Paraspinal and other soft tissues: Negative Disc levels: Diffuse degenerative spurring anteriorly and laterally. Bilateral degenerative facet disease, most pronounced in the lower lumbar spine. Disc spaces maintained. Broad-based disc bulges at L3-4 through L5-S1. Mild central canal stenosis at L3-4 and L4-5 due to disc bulges and facet disease. IMPRESSION: No acute bony abnormality. 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 Electronically Signed   By:  Franky Crease M.D.   On: 08/16/2024 18:18   DG Pelvis Portable Result Date: 08/16/2024 CLINICAL DATA:  Lower right-sided to middle of the back pain status post recent fall. EXAM: PORTABLE PELVIS 1-2 VIEWS COMPARISON:  May 16, 2019 FINDINGS: There is no evidence of an acute pelvic fracture or diastasis. A radiopaque intramedullary rod and compression screw device is seen within the proximal right femur. Chronic deformities of the greater and lesser trochanters of the proximal right femur are also noted. These are present on the prior study. No pelvic bone lesions are seen. IMPRESSION: 1. No acute  pelvic fracture or diastasis. 2. Prior ORIF of the proximal right femur. Electronically Signed   By: Suzen Dials M.D.   On: 08/16/2024 17:56     Subjective: No acute issues/events overnight   Discharge Exam: Vitals:   08/26/24 0428 08/26/24 1402  BP: (!) 170/97 (!) 157/99  Pulse: 88 84  Resp: 14 16  Temp: 98.4 F (36.9 C) 98.3 F (36.8 C)  SpO2: 100% 100%   Vitals:   08/25/24 2039 08/26/24 0010 08/26/24 0428 08/26/24 1402  BP: (!) 169/97 (!) 160/96 (!) 170/97 (!) 157/99  Pulse: 94 92 88 84  Resp: 16 16 14 16   Temp: 98.3 F (36.8 C) 98.5 F (36.9 C) 98.4 F (36.9 C) 98.3 F (36.8 C)  TempSrc: Oral Oral Oral Oral  SpO2: 100% 100% 100% 100%  Weight:   96 kg   Height:        General: Pt is alert, awake, not in acute distress Cardiovascular: RRR, S1/S2 +, no rubs, no gallops Respiratory: CTA bilaterally, no wheezing, no rhonchi Abdominal: Soft, NT, ND, bowel sounds + Extremities: no edema, no cyanosis    The results of significant diagnostics from this hospitalization (including imaging, microbiology, ancillary and laboratory) are listed below for reference.     Microbiology: No results found for this or any previous visit (from the past 240 hours).   Labs: BNP (last 3 results) No results for input(s): BNP in the last 8760 hours. Basic Metabolic Panel: Recent Labs  Lab 08/22/24 0433 08/24/24 2047 08/24/24 2052 08/25/24 0235 08/25/24 0609 08/26/24 0610  NA 133* 126* 125* 129* 129* 129*  K 3.0* 4.0 3.9 3.8 3.7 3.4*  CL 89* 80* 83* 86* 86* 90*  CO2 29 26  --  28 26 25   GLUCOSE 114* 262* 259* 172* 160* 136*  BUN 19 30* 31* 31* 32* 27*  CREATININE 1.01 4.08* 4.70* 3.24* 2.88* 1.35*  CALCIUM  8.8* 10.3  --  9.5 9.5 8.8*  MG 1.6*  --   --   --   --   --   PHOS 2.8  --   --   --   --   --    Liver Function Tests: Recent Labs  Lab 08/20/24 1432 08/22/24 0433 08/24/24 2047 08/26/24 0610  AST 151* 98* 43* 37  ALT 107* 83* 63* 40  ALKPHOS 85 63 88  69  BILITOT 1.0 0.9 0.8 0.6  PROT 9.6* 7.1 9.7* 7.4  ALBUMIN  5.3* 4.1 4.9 3.9   Recent Labs  Lab 08/20/24 1432 08/24/24 2047  LIPASE 105* 38   No results for input(s): AMMONIA in the last 168 hours. CBC: Recent Labs  Lab 08/20/24 1432 08/21/24 0525 08/22/24 0433 08/24/24 2047 08/24/24 2052 08/26/24 0610  WBC 6.8 6.6 5.7 10.4  --  5.9  HGB 13.9 12.6* 12.1* 14.7 16.7 11.6*  HCT 39.5 36.8* 36.5* 42.9 49.0 35.1*  MCV  84.4 85.8 86.1 85.6  --  86.0  PLT 245 208 229 404*  --  328   Cardiac Enzymes: Recent Labs  Lab 08/24/24 2047  CKTOTAL 241   BNP: Invalid input(s): POCBNP CBG: Recent Labs  Lab 08/25/24 1151 08/25/24 1621 08/25/24 2040 08/26/24 0738 08/26/24 1116  GLUCAP 123* 165* 159* 130* 116*   D-Dimer No results for input(s): DDIMER in the last 72 hours. Hgb A1c Recent Labs    08/25/24 1548  HGBA1C 6.1*   Lipid Profile No results for input(s): CHOL, HDL, LDLCALC, TRIG, CHOLHDL, LDLDIRECT in the last 72 hours. Thyroid  function studies No results for input(s): TSH, T4TOTAL, T3FREE, THYROIDAB in the last 72 hours.  Invalid input(s): FREET3 Anemia work up No results for input(s): VITAMINB12, FOLATE, FERRITIN, TIBC, IRON, RETICCTPCT in the last 72 hours. Urinalysis    Component Value Date/Time   COLORURINE AMBER (A) 08/25/2024 0510   APPEARANCEUR CLOUDY (A) 08/25/2024 0510   LABSPEC 1.021 08/25/2024 0510   PHURINE 5.0 08/25/2024 0510   GLUCOSEU NEGATIVE 08/25/2024 0510   HGBUR NEGATIVE 08/25/2024 0510   BILIRUBINUR NEGATIVE 08/25/2024 0510   KETONESUR NEGATIVE 08/25/2024 0510   PROTEINUR 100 (A) 08/25/2024 0510   UROBILINOGEN 1.0 07/29/2014 1837   NITRITE NEGATIVE 08/25/2024 0510   LEUKOCYTESUR NEGATIVE 08/25/2024 0510   Sepsis Labs Recent Labs  Lab 08/21/24 0525 08/22/24 0433 08/24/24 2047 08/26/24 0610  WBC 6.6 5.7 10.4 5.9   Microbiology No results found for this or any previous visit (from the  past 240 hours).   Time coordinating discharge: Over 30 minutes  SIGNED:   Elsie JAYSON [place], DO [facility] 08/26/2024, 2:22 PM Pager   If 7PM-7AM, please contact night-coverage [email or web address]

## 2024-08-26 NOTE — Plan of Care (Signed)
 Problem: Education: Goal: Ability to describe self-care measures that may prevent or decrease complications (Diabetes Survival Skills Education) will improve 08/26/2024 1417 by Rosamond Mould, Lennart Res, RN Outcome: Completed/Met 08/26/2024 1028 by Mozqueda Jaramillo, Lennart Res, RN Outcome: Progressing Goal: Individualized Educational Video(s) 08/26/2024 1417 by Rosamond Mould, Lennart Res, RN Outcome: Completed/Met 08/26/2024 1028 by Rosamond Mould, Lennart Res, RN Outcome: Progressing   Problem: Coping: Goal: Ability to adjust to condition or change in health will improve 08/26/2024 1417 by Rosamond Mould, Lennart Res, RN Outcome: Completed/Met 08/26/2024 1028 by Rosamond Mould, Lennart Res, RN Outcome: Progressing   Problem: Fluid Volume: Goal: Ability to maintain a balanced intake and output will improve 08/26/2024 1417 by Rosamond Mould Lennart Res, RN Outcome: Completed/Met 08/26/2024 1028 by Rosamond Mould, Lennart Res, RN Outcome: Progressing   Problem: Health Behavior/Discharge Planning: Goal: Ability to identify and utilize available resources and services will improve 08/26/2024 1417 by Rosamond Mould, Lennart Res, RN Outcome: Completed/Met 08/26/2024 1028 by Rosamond Mould, Lennart Res, RN Outcome: Progressing Goal: Ability to manage health-related needs will improve 08/26/2024 1417 by Rosamond Mould, Lennart Res, RN Outcome: Completed/Met 08/26/2024 1028 by Rosamond Mould, Lennart Res, RN Outcome: Progressing   Problem: Metabolic: Goal: Ability to maintain appropriate glucose levels will improve 08/26/2024 1417 by Rosamond Mould Lennart Res, RN Outcome: Completed/Met 08/26/2024 1028 by Rosamond Mould, Lennart Res, RN Outcome: Progressing   Problem: Nutritional: Goal: Maintenance of adequate nutrition will improve 08/26/2024 1417 by Rosamond Mould,  Lennart Res, RN Outcome: Completed/Met 08/26/2024 1028 by Rosamond Mould, Lennart Res, RN Outcome: Progressing Goal: Progress toward achieving an optimal weight will improve 08/26/2024 1417 by Rosamond Mould Lennart Res, RN Outcome: Completed/Met 08/26/2024 1028 by Rosamond Mould, Lennart Res, RN Outcome: Progressing   Problem: Skin Integrity: Goal: Risk for impaired skin integrity will decrease 08/26/2024 1417 by Rosamond Mould Lennart Res, RN Outcome: Completed/Met 08/26/2024 1028 by Rosamond Mould, Lennart Res, RN Outcome: Progressing   Problem: Tissue Perfusion: Goal: Adequacy of tissue perfusion will improve 08/26/2024 1417 by Mozqueda Jaramillo, Lennart Res, RN Outcome: Completed/Met 08/26/2024 1028 by Mozqueda Jaramillo, Lennart Res, RN Outcome: Progressing   Problem: Education: Goal: Knowledge of General Education information will improve Description: Including pain rating scale, medication(s)/side effects and non-pharmacologic comfort measures 08/26/2024 1417 by Mozqueda Jaramillo, Lennart Res, RN Outcome: Completed/Met 08/26/2024 1028 by Mozqueda Jaramillo, Lennart Res, RN Outcome: Progressing   Problem: Health Behavior/Discharge Planning: Goal: Ability to manage health-related needs will improve 08/26/2024 1417 by Rosamond Mould, Lennart Res, RN Outcome: Completed/Met 08/26/2024 1028 by Mozqueda Jaramillo, Lennart Res, RN Outcome: Progressing   Problem: Clinical Measurements: Goal: Ability to maintain clinical measurements within normal limits will improve 08/26/2024 1417 by Rosamond Mould, Lennart Res, RN Outcome: Completed/Met 08/26/2024 1028 by Mozqueda Jaramillo, Franz Svec Alondra, RN Outcome: Progressing Goal: Will remain free from infection 08/26/2024 1417 by Rosamond Mould, Lennart Res, RN Outcome: Completed/Met 08/26/2024 1028 by Mozqueda Jaramillo, Lennart Res, RN Outcome:  Progressing Goal: Diagnostic test results will improve 08/26/2024 1417 by Rosamond Mould, Lennart Res, RN Outcome: Completed/Met 08/26/2024 1028 by Mozqueda Jaramillo, Lenia Housley Alondra, RN Outcome: Progressing Goal: Respiratory complications will improve 08/26/2024 1417 by Mozqueda Jaramillo, Eddith Mentor Alondra, RN Outcome: Completed/Met 08/26/2024 1028 by Mozqueda Jaramillo, Slayton Lubitz Alondra, RN Outcome: Progressing Goal: Cardiovascular complication will be avoided 08/26/2024 1417 by Mozqueda Jaramillo, Jameeka Marcy Alondra, RN Outcome: Completed/Met 08/26/2024 1028 by Mozqueda Jaramillo, Yair Dusza Alondra, RN Outcome: Progressing   Problem: Activity: Goal: Risk for activity intolerance will decrease 08/26/2024 1417 by Mozqueda Jaramillo, Orell Hurtado Alondra, RN Outcome: Completed/Met 08/26/2024  1028 by Mozqueda Jaramillo, Lennart Res, RN Outcome: Progressing   Problem: Nutrition: Goal: Adequate nutrition will be maintained 08/26/2024 1417 by Mozqueda Jaramillo, Lennart Res, RN Outcome: Completed/Met 08/26/2024 1028 by Mozqueda Jaramillo, Lennart Res, RN Outcome: Progressing   Problem: Coping: Goal: Level of anxiety will decrease 08/26/2024 1417 by Mozqueda Jaramillo, Lennart Res, RN Outcome: Completed/Met 08/26/2024 1028 by Mozqueda Jaramillo, Lennart Res, RN Outcome: Progressing   Problem: Elimination: Goal: Will not experience complications related to bowel motility 08/26/2024 1417 by Rosamond Justina Lennart Res, RN Outcome: Completed/Met 08/26/2024 1028 by Mozqueda Jaramillo, Lennart Res, RN Outcome: Progressing Goal: Will not experience complications related to urinary retention 08/26/2024 1417 by Rosamond Justina Lennart Res, RN Outcome: Completed/Met 08/26/2024 1028 by Mozqueda Jaramillo, Lennart Res, RN Outcome: Progressing   Problem: Pain Managment: Goal: General experience of comfort will improve and/or be controlled 08/26/2024 1417 by  Rosamond Justina Lennart Res, RN Outcome: Completed/Met 08/26/2024 1028 by Mozqueda Jaramillo, Kennedee Kitzmiller Alondra, RN Outcome: Progressing   Problem: Safety: Goal: Ability to remain free from injury will improve 08/26/2024 1417 by Rosamond Justina, Lennart Res, RN Outcome: Completed/Met 08/26/2024 1028 by Mozqueda Jaramillo, Amish Mintzer Alondra, RN Outcome: Progressing   Problem: Skin Integrity: Goal: Risk for impaired skin integrity will decrease 08/26/2024 1417 by Rosamond Justina Lennart Res, RN Outcome: Completed/Met 08/26/2024 1028 by Mozqueda Jaramillo, Kamya Watling Alondra, RN Outcome: Progressing

## 2024-08-26 NOTE — Plan of Care (Signed)

## 2024-08-29 ENCOUNTER — Telehealth: Payer: Self-pay | Admitting: *Deleted

## 2024-08-29 NOTE — Transitions of Care (Post Inpatient/ED Visit) (Signed)
   08/29/2024  Name: Richard Lopez MRN: [id number] DOB: [birth year]  Today's TOC FU Call Status: Today's TOC FU Call Status:: Unsuccessful Call (1st Attempt) Unsuccessful Call (1st Attempt) Date: 08/29/24  Attempted to reach the patient regarding the most recent Inpatient/ED visit.  Follow Up Plan: Additional outreach attempts will be made to reach the patient to complete the Transitions of Care (Post Inpatient/ED visit) call.   Andrea Dimes RN, BSN [place]  [facility] RN Care Manager [phone]

## 2024-08-30 ENCOUNTER — Telehealth: Payer: Self-pay | Admitting: *Deleted

## 2024-08-30 NOTE — Transitions of Care (Post Inpatient/ED Visit) (Signed)
   08/30/2024  Name: Richard Lopez MRN: [id number] DOB: 08/01/1976  Today's TOC FU Call Status: Today's TOC FU Call Status:: Unsuccessful Call (2nd Attempt) Unsuccessful Call (2nd Attempt) Date: 08/30/24  Attempted to reach the patient regarding the most recent Inpatient/ED visit.  Follow Up Plan: Additional outreach attempts will be made to reach the patient to complete the Transitions of Care (Post Inpatient/ED visit) call.   Andrea Dimes RN, BSN [place]  [facility] RN Care Manager [phone]

## 2024-08-31 ENCOUNTER — Telehealth: Payer: Self-pay | Admitting: *Deleted

## 2024-08-31 NOTE — Transitions of Care (Post Inpatient/ED Visit) (Addendum)
 08/31/2024  Name: Richard Lopez MRN: [id number] DOB: 1976/04/05  Today's TOC FU Call Status: Today's TOC FU Call Status:: Successful TOC FU Call Completed TOC FU Call Complete Date: 08/31/24 Patient's Name and Date of Birth confirmed.  Transition Care Management Follow-up Telephone Call Date of Discharge: 08/26/24 Discharge Facility: [facility]) Type of Discharge: Inpatient Admission Primary Inpatient Discharge Diagnosis:: Renal Failure How have you been since you were released from the hospital?: Better Any questions or concerns?: Yes Patient Questions/Concerns:: Patient stated he was dehydrated and wantd to know why he- wasn't given Iv Fluids when he went in the 1st time onthe 14th Patient Questions/Concerns Addressed: Other: (RN explained that it was the Dr decision what treatment was to be given.)  Items Reviewed: Did you receive and understand the discharge instructions provided?: Yes Medications obtained,verified, and reconciled?: No Medications Not Reviewed Reasons:: Other: (Per patient he will be picking up his medications tomorrow) Any new allergies since your discharge?: No Dietary orders reviewed?: No Do you have support at home?: Yes People in Home [RPT]: parent(s) Name of Support/Comfort Primary Source: Hazel  Medications Reviewed Today: Medications Reviewed Today   Medications were not reviewed in this encounter     Home Care and Equipment/Supplies: Were Home Health Services Ordered?: NA Any new equipment or medical supplies ordered?: NA  Functional Questionnaire: Do you need assistance with bathing/showering or dressing?: No Do you need assistance with meal preparation?: No Do you need assistance with eating?: No Do you have difficulty maintaining continence: No Do you need assistance with getting out of bed/getting out of a chair/moving?: No Do you have difficulty managing or taking your medications?: No  Follow up appointments reviewed: PCP  Follow-up appointment confirmed?: No MD Provider Line Number:[phone] Given: Yes (Careguide attempted to schedule appointments. None available. Patient will call office to see if any they can fit him in.) Specialist Hospital Follow-up appointment confirmed?: NA Do you need transportation to your follow-up appointment?: No Do you understand care options if your condition(s) worsen?: Yes-patient verbalized understanding  SDOH Interventions Today    Flowsheet Row Most Recent Value  SDOH Interventions   Food Insecurity Interventions Intervention Not Indicated  Housing Interventions Intervention Not Indicated  Transportation Interventions Patient Resources (Friends/Family), Payor Benefit  Depression Interventions/Treatment  PHQ2-9 Score <4 Follow-up Not Indicated  Discussed and offered 30 day TOC program.  Patient  declined.  The patient has been provided with contact information for the care management team and has been advised to call with any health -related questions or concerns.  The patient verbalized understanding with current plan of care.  The patient is directed to their insurance card regarding availability of benefits coverage   The care guide attempted to schedule an appointment with this PCP. There were none available with PCP or any of the other providers. The patient was instructed to call the office and see if they could fit him in. 08/31/2024 RN called PCP and spoke with Delon in scheduling. There is no appointment available with Rosaline Bohr NP until December.  Per Delon she was able to set an appointment for 09/01/2024 at [facility] with Greig Drones [phone].  RN called the patient to let him know. Per patient he gets the [place] thru [facility] and needs at least 3 days to arrange.[phone] RN called PCP office back to speak with Delon. Delon was not available, so RN spoke with Maya. Maya stated that when a patient is doing a follow up from a hospital  admission, they must see their PCP and cannot go to one of the other offices or providers. She stated they will place his name at the front desk and have someone to call him when something is available to try to get him in within the 14 days. RN called the patient and made him aware.   Cathlean Headland BSN RN [place] [name] Care Management Coordinator [email or web address] Direct Dial: [phone]  Fax: [phone] Website: [email or web address]

## 2024-09-01 ENCOUNTER — Telehealth: Payer: Self-pay | Admitting: Primary Care

## 2024-09-01 ENCOUNTER — Encounter: Admitting: Family

## 2024-09-01 ENCOUNTER — Telehealth (INDEPENDENT_AMBULATORY_CARE_PROVIDER_SITE_OTHER): Payer: Self-pay

## 2024-09-01 NOTE — Telephone Encounter (Unsigned)
 Copied from CRM #[id number]. Topic: Appointments - Scheduling Inquiry for Clinic >> Aug 31, 2024  3:44 PM Mia F wrote: Reason for CRM: RN Cathlean from [facility] called stating pt was released to the hospital on 10/24. Someone had scheduled him with another pcp in another office but appt need to be rescheduled to lack of transportation. NP Rosaline does not have availability within 14 days. Please call to to reschedule

## 2024-09-01 NOTE — Telephone Encounter (Signed)
 Called pt to reschedule missed appt; could not reach or leave vm

## 2024-09-01 NOTE — Progress Notes (Signed)
 Erroneous encounter-disregard

## 2024-09-24 ENCOUNTER — Encounter (HOSPITAL_COMMUNITY): Payer: Self-pay

## 2024-09-24 ENCOUNTER — Emergency Department (HOSPITAL_COMMUNITY)
Admission: EM | Admit: 2024-09-24 | Discharge: 2024-09-25 | Disposition: A | Attending: Emergency Medicine | Admitting: Emergency Medicine

## 2024-09-24 DIAGNOSIS — S51812A Laceration without foreign body of left forearm, initial encounter: Secondary | ICD-10-CM | POA: Insufficient documentation

## 2024-09-24 DIAGNOSIS — W260XXA Contact with knife, initial encounter: Secondary | ICD-10-CM | POA: Diagnosis not present

## 2024-09-24 DIAGNOSIS — Z79899 Other long term (current) drug therapy: Secondary | ICD-10-CM | POA: Insufficient documentation

## 2024-09-24 DIAGNOSIS — Z23 Encounter for immunization: Secondary | ICD-10-CM | POA: Insufficient documentation

## 2024-09-24 DIAGNOSIS — S59912A Unspecified injury of left forearm, initial encounter: Secondary | ICD-10-CM | POA: Diagnosis present

## 2024-09-24 DIAGNOSIS — S41112A Laceration without foreign body of left upper arm, initial encounter: Secondary | ICD-10-CM

## 2024-09-24 MED ORDER — TETANUS-DIPHTH-ACELL PERTUSSIS 5-2-15.5 LF-MCG/0.5 IM SUSP
0.5000 mL | Freq: Once | INTRAMUSCULAR | Status: AC
  Administered 2024-09-24: 0.5 mL via INTRAMUSCULAR
  Filled 2024-09-24: qty 0.5

## 2024-09-24 MED ORDER — LIDOCAINE-EPINEPHRINE (PF) 2 %-1:200000 IJ SOLN
20.0000 mL | Freq: Once | INTRAMUSCULAR | Status: AC
  Administered 2024-09-24: 20 mL
  Filled 2024-09-24: qty 20

## 2024-09-24 NOTE — ED Triage Notes (Addendum)
 Pt BIB [facility] from home d/t 2 lac on left forearm while washing dishes.    BP 170/90 HR 99 99% RA RR 18

## 2024-09-24 NOTE — ED Provider Notes (Signed)
 [place] EMERGENCY DEPARTMENT AT [facility] Provider Note   CSN: [id number] Arrival date & time: 09/24/24  2312     Patient presents with: No chief complaint on file.   Richard Lopez is a 48 y.o. male with history of A-fib RVR, arthritis, depression, gouty arthritis, pulmonary [place].  Presents ED complaining of lacerations to left forearm.  Reports that he was at home, accidentally cut his left forearm 2 times.  Denies this was an attempt at self-harm.  Reports that he accidentally had the knife slipped.  Denies SI or HI.  Arrives with 2 lacerations to forearm of left arm.  He is unsure of tetanus update.  HPI     Prior to Admission medications   Medication Sig Start Date End Date Taking? Authorizing Provider  amLODipine  (NORVASC ) 10 MG tablet Take 10 mg by mouth daily.    [provider]  Blood Glucose-BP Monitor (BLOOD GLUCOSE-WRIST BP MONITOR) DEVI 1 Bag by Does not apply route in the morning, at noon, and at bedtime. 04/24/24   Celestia Rosaline SQUIBB, NP  Blood Pressure Monitoring (BLOOD PRESSURE MONITOR AUTOMAT) DEVI 1 kit by Does not apply route in the morning, at noon, and at bedtime. 05/16/24   Celestia Rosaline SQUIBB, NP  ferrous sulfate  325 (65 FE) MG tablet Take 1 tablet (325 mg total) by mouth daily with breakfast. 08/23/24   Franchot Novel, MD  Multiple Vitamin (MULTIVITAMIN WITH MINERALS) TABS tablet Take 1 tablet by mouth daily. 03/02/24   Celestia Rosaline SQUIBB, NP  ondansetron  (ZOFRAN ) 4 MG tablet Take 1 tablet (4 mg total) by mouth every 6 (six) hours as needed for nausea. 08/26/24   Lue Elsie BROCKS, MD  pantoprazole  (PROTONIX ) 40 MG tablet Take 1 tablet (40 mg total) by mouth 2 (two) times daily before a meal. 08/26/24   Lue Elsie BROCKS, MD  polyethylene glycol (MIRALAX ) 17 g packet Take 17 g by mouth daily. 08/22/24   Franchot Novel, MD  potassium chloride  SA (KLOR-CON  M) 20 MEQ tablet Take 1 tablet (20 mEq total) by mouth daily. 08/22/24    Franchot Novel, MD  sucralfate  (CARAFATE ) 1 GM/10ML suspension Take 1 g by mouth 4 (four) times daily.    [provider]  thiamine  (VITAMIN B-1) 100 MG tablet Take 1 tablet (100 mg total) by mouth daily. 08/23/24   Franchot Novel, MD    Allergies: Ativan  [lorazepam ] and Nsaids    Review of Systems  Updated Vital Signs There were no vitals taken for this visit.  Physical Exam  (all labs ordered are listed, but only abnormal results are displayed) Labs Reviewed - No data to display  EKG: None  Radiology: No results found.  {Document cardiac monitor, telemetry assessment procedure when appropriate:32947} Procedures   Medications Ordered in the ED  lidocaine -EPINEPHrine  (XYLOCAINE  W/EPI) 2 %-1:200000 (PF) injection 20 mL (has no administration in time range)  Tdap (ADACEL ) injection 0.5 mL (has no administration in time range)      {Click here for ABCD2, HEART and other calculators REFRESH Note before signing:1}                              Medical Decision Making Risk Prescription drug management.   ***  {Document critical care time when appropriate  Document review of labs and clinical decision tools ie CHADS2VASC2, etc  Document your independent review of radiology images and any outside records  Document your discussion with  family members, caretakers and with consultants  Document social determinants of health affecting pt's care  Document your decision making why or why not admission, treatments were needed:32947:::1}   Final diagnoses:  None    ED Discharge Orders     None

## 2024-09-25 NOTE — Discharge Instructions (Signed)
 Please follow-up with your PCP in 10 days for removal of sutures.  Please keep wound dressed.  Please do not submerge wounds in water.  Please read attached guide concerning laceration care.  Return to ED with new symptoms.

## 2024-10-21 ENCOUNTER — Encounter (HOSPITAL_COMMUNITY): Payer: Self-pay

## 2024-10-21 ENCOUNTER — Ambulatory Visit (HOSPITAL_COMMUNITY)
Admission: EM | Admit: 2024-10-21 | Discharge: 2024-10-21 | Disposition: A | Discharge status: Home / Self Care | Attending: Family Medicine | Admitting: Family Medicine

## 2024-10-21 DIAGNOSIS — Z4802 Encounter for removal of sutures: Secondary | ICD-10-CM

## 2024-10-21 NOTE — ED Triage Notes (Signed)
 Patient has 2 sutured areas to the left forearm that were placed on 09/24/24 in the ED. No redness, edema, or redness noted.

## 2024-10-21 NOTE — Discharge Instructions (Signed)
 All 11 sutures were removed.

## 2024-10-21 NOTE — ED Provider Notes (Signed)
 " [facility]    CSN: [id number] Arrival date & time: 10/21/24  1508      History   Chief Complaint Chief Complaint  Patient presents with   Suture / Staple Removal    HPI Richard Lopez is a 48 y.o. male.    Suture / Staple Removal   Here for suture removal.  On November 22 he was seen in the emergency room and had to laceration sutured on his forearm on the left.  This is occurred when he had an accident with a knife and did not have any suicidal thoughts or ideations.  He was post to return in about 10 days for suture removal, but he had transportation difficulties and so is returning today  He has been taking care of the wounds and does not note any drainage or redness or swelling or pain there. Past Medical History:  Diagnosis Date   Alcohol dependence with intoxication (HCC) 03/25/2019   Anxiety    Arthritis    hands and possibly knee   Atrial fibrillation with RVR (HCC) 04/22/2020   Closed right hip fracture, initial encounter (HCC) 03/25/2019   Depression    Diabetes mellitus    diet controlled   Esophagitis determined by endoscopy 08/21/2019   Essential hypertension    Gastritis    Gout    Intertrochanteric fracture of right hip (HCC) 04/14/2019   Normocytic anemia due to blood loss 08/21/2019   Pulmonary embolism [facility])     Patient Active Problem List   Diagnosis Date Noted   Renal failure 08/24/2024   Acute renal failure (ARF) 08/20/2024   Esophagitis 04/12/2024   Intractable nausea and vomiting 04/11/2024   Cannabis hyperemesis syndrome concurrent with and due to cannabis abuse 04/11/2024   Metabolic alkalosis 04/11/2024   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   Alcohol abuse 08/05/2023   Depression 06/17/2023   Hypercalcemia 04/20/2023   AF (paroxysmal atrial fibrillation) (HCC)    AKI (acute kidney injury) 06/18/2020   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   Hyponatremia 04/21/2020   Anemia 08/21/2019    Hypokalemia 08/17/2019   Hemoptysis 08/17/2019   Upper GI bleed 08/13/2019   Cavitary lesion of lung 08/01/2019   Gout 05/18/2019   Peri-prosthetic fracture of femur at tip of prosthesis 05/16/2019   Alcohol use 05/16/2019   Vitamin D  deficiency 04/14/2019   Thrombocytopenia 03/25/2019   Prediabetes 04/02/2017   Marijuana abuse 04/01/2017   Cannabinoid hyperemesis syndrome 07/29/2014   Essential hypertension 07/29/2014    Past Surgical History:  Procedure Laterality Date   BIOPSY  11/12/2022   Procedure: BIOPSY;  Surgeon: Saintclair Jasper, MD;  Location: [facility];  Service: Gastroenterology;;   ESOPHAGOGASTRODUODENOSCOPY N/A 04/12/2024   Procedure: EGD (ESOPHAGOGASTRODUODENOSCOPY);  Surgeon: Saintclair Jasper, MD;  Location: [facility];  Service: Gastroenterology;  Laterality: N/A;   ESOPHAGOGASTRODUODENOSCOPY (EGD) WITH PROPOFOL  N/A 08/14/2019   Procedure: ESOPHAGOGASTRODUODENOSCOPY (EGD) WITH PROPOFOL ;  Surgeon: Dianna Specking, MD;  Location: [facility];  Service: Endoscopy;  Laterality: N/A;   ESOPHAGOGASTRODUODENOSCOPY (EGD) WITH PROPOFOL  N/A 11/12/2022   Procedure: ESOPHAGOGASTRODUODENOSCOPY (EGD) WITH PROPOFOL ;  Surgeon: Saintclair Jasper, MD;  Location: [facility];  Service: Gastroenterology;  Laterality: N/A;   EXTERNAL FIXATION LEG Left 11/07/2018   Procedure: EXTERNAL FIXATION LEFT LOWER LEG;  Surgeon: Fidel Rogue, MD;  Location: [facility];  Service: Orthopedics;  Laterality: Left;   EXTERNAL FIXATION REMOVAL Left 11/08/2018   Procedure: REMOVAL EXTERNAL FIXATION LEG;  Surgeon: Kendal Franky SQUIBB, MD;  Location: [facility];  Service: Orthopedics;  Laterality: Left;   INTRAMEDULLARY (IM) NAIL INTERTROCHANTERIC Right 03/26/2019   Procedure: INTRAMEDULLARY (IM) NAIL INTERTROCHANTRIC;  Surgeon: Kendal Franky SQUIBB, MD;  Location: [facility];  Service: Orthopedics;  Laterality: Right;   INTRAMEDULLARY (IM) NAIL INTERTROCHANTERIC Right 05/17/2019   Procedure: Intramedullary (Im) Nail Intertroch with circlage  wiring;  Surgeon: Celena Sharper, MD;  Location: [facility];  Service: Orthopedics;  Laterality: Right;   NO PAST SURGERIES     OPEN REDUCTION INTERNAL FIXATION (ORIF) TIBIA/FIBULA FRACTURE Left 11/08/2018   Procedure: OPEN REDUCTION INTERNAL FIXATION (ORIF) TIBIA/FIBULA FRACTURE;  Surgeon: Kendal Franky SQUIBB, MD;  Location: [facility];  Service: Orthopedics;  Laterality: Left;   ORIF FEMUR FRACTURE Right 05/17/2019   Procedure: REMOVAL  OF HARDWARE;  Surgeon: Celena Sharper, MD;  Location: [facility];  Service: Orthopedics;  Laterality: Right;       Home Medications    Prior to Admission medications  Medication Sig Start Date End Date Taking? Authorizing Provider  amLODipine  (NORVASC ) 10 MG tablet Take 10 mg by mouth daily.    [provider]  Blood Glucose-BP Monitor (BLOOD GLUCOSE-WRIST BP MONITOR) DEVI 1 Bag by Does not apply route in the morning, at noon, and at bedtime. 04/24/24   Celestia Rosaline SQUIBB, NP  Blood Pressure Monitoring (BLOOD PRESSURE MONITOR AUTOMAT) DEVI 1 kit by Does not apply route in the morning, at noon, and at bedtime. 05/16/24   Celestia Rosaline SQUIBB, NP  ferrous sulfate  325 (65 FE) MG tablet Take 1 tablet (325 mg total) by mouth daily with breakfast. 08/23/24   Franchot Novel, MD  Multiple Vitamin (MULTIVITAMIN WITH MINERALS) TABS tablet Take 1 tablet by mouth daily. 03/02/24   Celestia Rosaline SQUIBB, NP  ondansetron  (ZOFRAN ) 4 MG tablet Take 1 tablet (4 mg total) by mouth every 6 (six) hours as needed for nausea. 08/26/24   Lue Elsie BROCKS, MD  pantoprazole  (PROTONIX ) 40 MG tablet Take 1 tablet (40 mg total) by mouth 2 (two) times daily before a meal. 08/26/24   Lue Elsie BROCKS, MD  polyethylene glycol (MIRALAX ) 17 g packet Take 17 g by mouth daily. 08/22/24   Franchot Novel, MD  potassium chloride  SA (KLOR-CON  M) 20 MEQ tablet Take 1 tablet (20 mEq total) by mouth daily. 08/22/24   Franchot Novel, MD  sucralfate  (CARAFATE ) 1 GM/10ML suspension Take 1 g by mouth 4  (four) times daily.    [provider]  thiamine  (VITAMIN B-1) 100 MG tablet Take 1 tablet (100 mg total) by mouth daily. 08/23/24   Franchot Novel, MD    Family History Family History  Problem Relation Age of Onset   Diabetes Mellitus II Father    Diabetes Mellitus II Other    CAD Other     Social History Social History[1]   Allergies   Ativan  [lorazepam ] and Nsaids   Review of Systems Review of Systems   Physical Exam Triage Vital Signs ED Triage Vitals  Encounter Vitals Group     BP 10/21/24 1617 (!) 141/91     Girls Systolic BP Percentile --      Girls Diastolic BP Percentile --      Boys Systolic BP Percentile --      Boys Diastolic BP Percentile --      Pulse Rate 10/21/24 1617 63     Resp 10/21/24 1617 14     Temp 10/21/24 1617 98.1 F (36.7 C)     Temp Source 10/21/24 1617 Oral  SpO2 10/21/24 1617 94 %     Weight --      Height --      Head Circumference --      Peak Flow --      Pain Score 10/21/24 1615 0     Pain Loc --      Pain Education --      Exclude from Growth Chart --    No data found.  Updated Vital Signs BP (!) 141/91 (BP Location: Right Arm) Comment: Patient states he has not taken his BP med today.  Pulse 63   Temp 98.1 F (36.7 C) (Oral)   Resp 14   SpO2 94%   Visual Acuity Right Eye Distance:   Left Eye Distance:   Bilateral Distance:    Right Eye Near:   Left Eye Near:    Bilateral Near:     Physical Exam Vitals reviewed.  Skin:    Coloration: Skin is not pale.     Comments: Both lacerations are well-healed and sealed.  No drainage no erythema  Neurological:     General: No focal deficit present.     Mental Status: He is oriented to person, place, and time.  Psychiatric:        Behavior: Behavior normal.      UC Treatments / Results  Labs (all labs ordered are listed, but only abnormal results are displayed) Labs Reviewed - No data to display  EKG   Radiology No results  found.  Procedures Procedures (including critical care time)  Medications Ordered in UC Medications - No data to display  Initial Impression / Assessment and Plan / UC Course  I have reviewed the triage vital signs and the nursing notes.  Pertinent labs & imaging results that were available during my care of the patient were reviewed by me and considered in my medical decision making (see chart for details).    {The patient has not been seen in Urgent Care in the last 3 years. :1 11 sutures are removed without difficulty. Final Clinical Impressions(s) / UC Diagnoses   Final diagnoses:  Visit for suture removal     Discharge Instructions      All 11 sutures were removed.      ED Prescriptions   None    PDMP not reviewed this encounter.    [1]  Social History Tobacco Use   Smoking status: Former    Types: Cigarettes   Smokeless tobacco: Never   Tobacco comments:    About 1 cigarette per day or less  Vaping Use   Vaping status: Never Used  Substance Use Topics   Alcohol use: Yes    Alcohol/week: 200.0 standard drinks of alcohol    Types: 200 Cans of beer per week   Drug use: Yes    Types: Marijuana     Vonna Sharlet POUR, MD 10/21/24 1707  "

## 2024-11-12 ENCOUNTER — Inpatient Hospital Stay (HOSPITAL_COMMUNITY)
Admission: EM | Admit: 2024-11-12 | Discharge: 2024-11-16 | DRG: 682 | Disposition: A | Discharge status: Home / Self Care | Attending: Student | Admitting: Student

## 2024-11-12 ENCOUNTER — Other Ambulatory Visit: Payer: Self-pay

## 2024-11-12 ENCOUNTER — Encounter (HOSPITAL_COMMUNITY): Payer: Self-pay | Admitting: Internal Medicine

## 2024-11-12 DIAGNOSIS — E860 Dehydration: Secondary | ICD-10-CM | POA: Diagnosis present

## 2024-11-12 DIAGNOSIS — Z886 Allergy status to analgesic agent status: Secondary | ICD-10-CM

## 2024-11-12 DIAGNOSIS — D62 Acute posthemorrhagic anemia: Secondary | ICD-10-CM | POA: Diagnosis not present

## 2024-11-12 DIAGNOSIS — F419 Anxiety disorder, unspecified: Secondary | ICD-10-CM | POA: Diagnosis present

## 2024-11-12 DIAGNOSIS — E876 Hypokalemia: Secondary | ICD-10-CM | POA: Diagnosis not present

## 2024-11-12 DIAGNOSIS — N179 Acute kidney failure, unspecified: Principal | ICD-10-CM | POA: Diagnosis present

## 2024-11-12 DIAGNOSIS — I480 Paroxysmal atrial fibrillation: Secondary | ICD-10-CM | POA: Diagnosis present

## 2024-11-12 DIAGNOSIS — K219 Gastro-esophageal reflux disease without esophagitis: Secondary | ICD-10-CM | POA: Diagnosis present

## 2024-11-12 DIAGNOSIS — F32A Depression, unspecified: Secondary | ICD-10-CM | POA: Diagnosis present

## 2024-11-12 DIAGNOSIS — F17210 Nicotine dependence, cigarettes, uncomplicated: Secondary | ICD-10-CM | POA: Diagnosis present

## 2024-11-12 DIAGNOSIS — Z8249 Family history of ischemic heart disease and other diseases of the circulatory system: Secondary | ICD-10-CM

## 2024-11-12 DIAGNOSIS — K3189 Other diseases of stomach and duodenum: Secondary | ICD-10-CM | POA: Diagnosis present

## 2024-11-12 DIAGNOSIS — Z794 Long term (current) use of insulin: Secondary | ICD-10-CM | POA: Diagnosis not present

## 2024-11-12 DIAGNOSIS — F418 Other specified anxiety disorders: Secondary | ICD-10-CM | POA: Diagnosis not present

## 2024-11-12 DIAGNOSIS — E8809 Other disorders of plasma-protein metabolism, not elsewhere classified: Secondary | ICD-10-CM | POA: Diagnosis present

## 2024-11-12 DIAGNOSIS — E8729 Other acidosis: Secondary | ICD-10-CM | POA: Diagnosis present

## 2024-11-12 DIAGNOSIS — K2211 Ulcer of esophagus with bleeding: Secondary | ICD-10-CM | POA: Diagnosis present

## 2024-11-12 DIAGNOSIS — F1010 Alcohol abuse, uncomplicated: Secondary | ICD-10-CM | POA: Diagnosis present

## 2024-11-12 DIAGNOSIS — R451 Restlessness and agitation: Secondary | ICD-10-CM | POA: Diagnosis not present

## 2024-11-12 DIAGNOSIS — K922 Gastrointestinal hemorrhage, unspecified: Secondary | ICD-10-CM | POA: Diagnosis present

## 2024-11-12 DIAGNOSIS — R110 Nausea: Secondary | ICD-10-CM | POA: Diagnosis present

## 2024-11-12 DIAGNOSIS — Z888 Allergy status to other drugs, medicaments and biological substances status: Secondary | ICD-10-CM

## 2024-11-12 DIAGNOSIS — E872 Acidosis, unspecified: Secondary | ICD-10-CM | POA: Diagnosis present

## 2024-11-12 DIAGNOSIS — M25461 Effusion, right knee: Secondary | ICD-10-CM | POA: Diagnosis not present

## 2024-11-12 DIAGNOSIS — E1165 Type 2 diabetes mellitus with hyperglycemia: Secondary | ICD-10-CM | POA: Diagnosis present

## 2024-11-12 DIAGNOSIS — Z79899 Other long term (current) drug therapy: Secondary | ICD-10-CM

## 2024-11-12 DIAGNOSIS — M1A9XX1 Chronic gout, unspecified, with tophus (tophi): Secondary | ICD-10-CM | POA: Diagnosis present

## 2024-11-12 DIAGNOSIS — K920 Hematemesis: Secondary | ICD-10-CM | POA: Diagnosis not present

## 2024-11-12 DIAGNOSIS — K2970 Gastritis, unspecified, without bleeding: Secondary | ICD-10-CM | POA: Diagnosis present

## 2024-11-12 DIAGNOSIS — F10239 Alcohol dependence with withdrawal, unspecified: Secondary | ICD-10-CM | POA: Diagnosis not present

## 2024-11-12 DIAGNOSIS — Z86711 Personal history of pulmonary embolism: Secondary | ICD-10-CM

## 2024-11-12 DIAGNOSIS — K2900 Acute gastritis without bleeding: Secondary | ICD-10-CM | POA: Diagnosis not present

## 2024-11-12 DIAGNOSIS — M1711 Unilateral primary osteoarthritis, right knee: Secondary | ICD-10-CM | POA: Diagnosis present

## 2024-11-12 DIAGNOSIS — Z860101 Personal history of adenomatous and serrated colon polyps: Secondary | ICD-10-CM

## 2024-11-12 DIAGNOSIS — K2210 Ulcer of esophagus without bleeding: Secondary | ICD-10-CM | POA: Diagnosis not present

## 2024-11-12 DIAGNOSIS — K449 Diaphragmatic hernia without obstruction or gangrene: Secondary | ICD-10-CM | POA: Diagnosis present

## 2024-11-12 DIAGNOSIS — E871 Hypo-osmolality and hyponatremia: Secondary | ICD-10-CM | POA: Diagnosis present

## 2024-11-12 DIAGNOSIS — M978XXA Periprosthetic fracture around other internal prosthetic joint, initial encounter: Secondary | ICD-10-CM

## 2024-11-12 DIAGNOSIS — Z833 Family history of diabetes mellitus: Secondary | ICD-10-CM

## 2024-11-12 DIAGNOSIS — I10 Essential (primary) hypertension: Secondary | ICD-10-CM | POA: Diagnosis present

## 2024-11-12 LAB — URINALYSIS, ROUTINE W REFLEX MICROSCOPIC
Bacteria, UA: NONE SEEN
Glucose, UA: NEGATIVE mg/dL
Ketones, ur: 5 mg/dL — AB
Nitrite: NEGATIVE
Protein, ur: >=300 mg/dL — AB
Specific Gravity, Urine: 1.019 (ref 1.005–1.030)
pH: 5 (ref 5.0–8.0)

## 2024-11-12 LAB — COMPREHENSIVE METABOLIC PANEL WITH GFR
ALT: 11 U/L (ref 0–44)
AST: 31 U/L (ref 15–41)
Albumin: 4.7 g/dL (ref 3.5–5.0)
Alkaline Phosphatase: 96 U/L (ref 38–126)
Anion gap: 25 — ABNORMAL HIGH (ref 5–15)
BUN: 23 mg/dL — ABNORMAL HIGH (ref 6–20)
CO2: 21 mmol/L — ABNORMAL LOW (ref 22–32)
Calcium: 9.7 mg/dL (ref 8.9–10.3)
Chloride: 85 mmol/L — ABNORMAL LOW (ref 98–111)
Creatinine, Ser: 2.93 mg/dL — ABNORMAL HIGH (ref 0.61–1.24)
GFR, Estimated: 26 mL/min — ABNORMAL LOW
Glucose, Bld: 183 mg/dL — ABNORMAL HIGH (ref 70–99)
Potassium: 3.7 mmol/L (ref 3.5–5.1)
Sodium: 130 mmol/L — ABNORMAL LOW (ref 135–145)
Total Bilirubin: 0.8 mg/dL (ref 0.0–1.2)
Total Protein: 8.9 g/dL — ABNORMAL HIGH (ref 6.5–8.1)

## 2024-11-12 LAB — BASIC METABOLIC PANEL WITH GFR
Anion gap: 18 — ABNORMAL HIGH (ref 5–15)
Anion gap: 18 — ABNORMAL HIGH (ref 5–15)
BUN: 26 mg/dL — ABNORMAL HIGH (ref 6–20)
BUN: 26 mg/dL — ABNORMAL HIGH (ref 6–20)
CO2: 21 mmol/L — ABNORMAL LOW (ref 22–32)
CO2: 20 mmol/L — ABNORMAL LOW (ref 22–32)
Calcium: 8.5 mg/dL — ABNORMAL LOW (ref 8.9–10.3)
Calcium: 8.5 mg/dL — ABNORMAL LOW (ref 8.9–10.3)
Chloride: 88 mmol/L — ABNORMAL LOW (ref 98–111)
Chloride: 90 mmol/L — ABNORMAL LOW (ref 98–111)
Creatinine, Ser: 2.24 mg/dL — ABNORMAL HIGH (ref 0.61–1.24)
Creatinine, Ser: 2.07 mg/dL — ABNORMAL HIGH (ref 0.61–1.24)
GFR, Estimated: 35 mL/min — ABNORMAL LOW
GFR, Estimated: 39 mL/min — ABNORMAL LOW
Glucose, Bld: 121 mg/dL — ABNORMAL HIGH (ref 70–99)
Glucose, Bld: 107 mg/dL — ABNORMAL HIGH (ref 70–99)
Potassium: 3.3 mmol/L — ABNORMAL LOW (ref 3.5–5.1)
Potassium: 3.3 mmol/L — ABNORMAL LOW (ref 3.5–5.1)
Sodium: 127 mmol/L — ABNORMAL LOW (ref 135–145)
Sodium: 128 mmol/L — ABNORMAL LOW (ref 135–145)

## 2024-11-12 LAB — LIPASE, BLOOD: Lipase: 37 U/L (ref 11–51)

## 2024-11-12 LAB — CBC
HCT: 44.3 % (ref 39.0–52.0)
Hemoglobin: 15.4 g/dL (ref 13.0–17.0)
MCH: 30.3 pg (ref 26.0–34.0)
MCHC: 34.8 g/dL (ref 30.0–36.0)
MCV: 87.2 fL (ref 80.0–100.0)
Platelets: 172 K/uL (ref 150–400)
RBC: 5.08 MIL/uL (ref 4.22–5.81)
RDW: 13.2 % (ref 11.5–15.5)
WBC: 7.7 K/uL (ref 4.0–10.5)
nRBC: 0 % (ref 0.0–0.2)

## 2024-11-12 LAB — HEMOGLOBIN AND HEMATOCRIT, BLOOD
HCT: 33.6 % — ABNORMAL LOW (ref 39.0–52.0)
HCT: 32.8 % — ABNORMAL LOW (ref 39.0–52.0)
Hemoglobin: 11.9 g/dL — ABNORMAL LOW (ref 13.0–17.0)
Hemoglobin: 11.7 g/dL — ABNORMAL LOW (ref 13.0–17.0)

## 2024-11-12 LAB — LACTIC ACID, PLASMA
Lactic Acid, Venous: 1.9 mmol/L (ref 0.5–1.9)
Lactic Acid, Venous: 2 mmol/L (ref 0.5–1.9)

## 2024-11-12 LAB — I-STAT CHEM 8, ED
BUN: 33 mg/dL — ABNORMAL HIGH (ref 6–20)
Calcium, Ion: 0.98 mmol/L — ABNORMAL LOW (ref 1.15–1.40)
Chloride: 90 mmol/L — ABNORMAL LOW (ref 98–111)
Creatinine, Ser: 3.1 mg/dL — ABNORMAL HIGH (ref 0.61–1.24)
Glucose, Bld: 195 mg/dL — ABNORMAL HIGH (ref 70–99)
HCT: 48 % (ref 39.0–52.0)
Hemoglobin: 16.3 g/dL (ref 13.0–17.0)
Potassium: 5.6 mmol/L — ABNORMAL HIGH (ref 3.5–5.1)
Sodium: 126 mmol/L — ABNORMAL LOW (ref 135–145)
TCO2: 25 mmol/L (ref 22–32)

## 2024-11-12 LAB — MAGNESIUM: Magnesium: 1.5 mg/dL — ABNORMAL LOW (ref 1.7–2.4)

## 2024-11-12 LAB — URINE DRUG SCREEN
Amphetamines: NEGATIVE
Barbiturates: NEGATIVE
Benzodiazepines: NEGATIVE
Cocaine: NEGATIVE
Fentanyl: NEGATIVE
Methadone Scn, Ur: NEGATIVE
Opiates: NEGATIVE
Tetrahydrocannabinol: POSITIVE — AB

## 2024-11-12 LAB — TYPE AND SCREEN
ABO/RH(D): O POS
Antibody Screen: NEGATIVE

## 2024-11-12 LAB — I-STAT CG4 LACTIC ACID, ED: Lactic Acid, Venous: 4.2 mmol/L (ref 0.5–1.9)

## 2024-11-12 LAB — ETHANOL: Alcohol, Ethyl (B): <15 mg/dL

## 2024-11-12 LAB — CK: Total CK: 188 U/L (ref 49–397)

## 2024-11-12 LAB — BETA-HYDROXYBUTYRIC ACID: Beta-Hydroxybutyric Acid: 1.46 mmol/L — ABNORMAL HIGH (ref 0.05–0.27)

## 2024-11-12 MED ORDER — PANTOPRAZOLE SODIUM 40 MG IV SOLR
40.0000 mg | Freq: Two times a day (BID) | INTRAVENOUS | Status: DC
  Administered 2024-11-12 – 2024-11-16 (×8): 40 mg via INTRAVENOUS
  Filled 2024-11-12 (×8): qty 10

## 2024-11-12 MED ORDER — SODIUM CHLORIDE 0.9 % IV BOLUS
1000.0000 mL | Freq: Once | INTRAVENOUS | Status: AC
  Administered 2024-11-12: 1000 mL via INTRAVENOUS

## 2024-11-12 MED ORDER — THIAMINE HCL 100 MG/ML IJ SOLN: 100.0000 mg | Freq: Every day | INTRAMUSCULAR | Status: DC

## 2024-11-12 MED ORDER — ACETAMINOPHEN 325 MG PO TABS
650.0000 mg | ORAL_TABLET | Freq: Four times a day (QID) | ORAL | Status: DC | PRN
  Administered 2024-11-14 – 2024-11-16 (×4): 650 mg via ORAL
  Filled 2024-11-12 (×4): qty 2

## 2024-11-12 MED ORDER — PANTOPRAZOLE SODIUM 40 MG IV SOLR
80.0000 mg | Freq: Once | INTRAVENOUS | Status: AC
  Administered 2024-11-12: 80 mg via INTRAVENOUS
  Filled 2024-11-12: qty 20

## 2024-11-12 MED ORDER — AMLODIPINE BESYLATE 10 MG PO TABS
10.0000 mg | ORAL_TABLET | Freq: Every day | ORAL | Status: DC
  Administered 2024-11-12 – 2024-11-16 (×5): 10 mg via ORAL
  Filled 2024-11-12: qty 2
  Filled 2024-11-12 (×4): qty 1

## 2024-11-12 MED ORDER — METOCLOPRAMIDE HCL 5 MG/ML IJ SOLN
5.0000 mg | Freq: Once | INTRAMUSCULAR | Status: AC
  Administered 2024-11-12: 5 mg via INTRAVENOUS
  Filled 2024-11-12: qty 2

## 2024-11-12 MED ORDER — ONDANSETRON HCL 4 MG/2ML IJ SOLN
4.0000 mg | Freq: Four times a day (QID) | INTRAMUSCULAR | Status: DC | PRN
  Administered 2024-11-14: 4 mg via INTRAVENOUS
  Filled 2024-11-12: qty 2

## 2024-11-12 MED ORDER — ONDANSETRON HCL 4 MG/2ML IJ SOLN
4.0000 mg | Freq: Once | INTRAMUSCULAR | Status: AC
  Administered 2024-11-12: 4 mg via INTRAVENOUS
  Filled 2024-11-12: qty 2

## 2024-11-12 MED ORDER — SODIUM CHLORIDE 0.9 % IV SOLN: INTRAVENOUS | Status: AC

## 2024-11-12 MED ORDER — SUCRALFATE 1 GM/10ML PO SUSP
1.0000 g | Freq: Three times a day (TID) | ORAL | Status: DC
  Administered 2024-11-12 – 2024-11-16 (×11): 1 g via ORAL
  Filled 2024-11-12 (×11): qty 10

## 2024-11-12 MED ORDER — THIAMINE MONONITRATE 100 MG PO TABS
100.0000 mg | ORAL_TABLET | Freq: Every day | ORAL | Status: DC
  Administered 2024-11-12 – 2024-11-16 (×5): 100 mg via ORAL
  Filled 2024-11-12 (×5): qty 1

## 2024-11-12 MED ORDER — ADULT MULTIVITAMIN W/MINERALS CH
1.0000 | ORAL_TABLET | Freq: Every day | ORAL | Status: DC
  Administered 2024-11-12 – 2024-11-16 (×5): 1 via ORAL
  Filled 2024-11-12 (×5): qty 1

## 2024-11-12 MED ORDER — ONDANSETRON HCL 4 MG PO TABS: 4.0000 mg | ORAL_TABLET | Freq: Four times a day (QID) | ORAL | Status: DC | PRN

## 2024-11-12 MED ORDER — ORAL CARE MOUTH RINSE: 15.0000 mL | OROMUCOSAL | Status: DC | PRN

## 2024-11-12 MED ORDER — MAGNESIUM SULFATE 2 GM/50ML IV SOLN
2.0000 g | Freq: Once | INTRAVENOUS | Status: AC
  Administered 2024-11-12: 2 g via INTRAVENOUS
  Filled 2024-11-12: qty 50

## 2024-11-12 MED ORDER — FOLIC ACID 1 MG PO TABS
1.0000 mg | ORAL_TABLET | Freq: Every day | ORAL | Status: DC
  Administered 2024-11-12 – 2024-11-16 (×5): 1 mg via ORAL
  Filled 2024-11-12 (×5): qty 1

## 2024-11-12 MED ORDER — ACETAMINOPHEN 650 MG RE SUPP: 650.0000 mg | Freq: Four times a day (QID) | RECTAL | Status: DC | PRN

## 2024-11-12 NOTE — ED Provider Notes (Signed)
 " [place] EMERGENCY DEPARTMENT AT [facility] Provider Note   CSN: [id number] Arrival date & time: 11/12/24  9168     Patient presents with: Nausea   Richard Lopez is a 49 y.o. male.  Patient with past medical history significant for cannabis abuse, alcohol abuse, esophageal ulcers with esophagitis, hypertension, history of PE and paroxysmal A-fib not on current anticoagulation presenting with abdominal pain, NVD after heavy drinking this week. Richard Lopez reports binge drinking due to break up with girlfriend. Denies SI/HI.   Last drink four days ago, excessive nausea and vomiting since then, now throwing up dark red blood, denies gross red blood in stool or melena, no BT. Reports generalized abdominal cramps.    HPI     Prior to Admission medications  Medication Sig Start Date End Date Taking? Authorizing Provider  amLODipine  (NORVASC ) 10 MG tablet Take 10 mg by mouth daily.   Yes [provider]  ondansetron  (ZOFRAN ) 4 MG tablet Take 1 tablet (4 mg total) by mouth every 6 (six) hours as needed for nausea. 08/26/24  Yes Lue Elsie BROCKS, MD  pantoprazole  (PROTONIX ) 40 MG tablet Take 1 tablet (40 mg total) by mouth 2 (two) times daily before a meal. 08/26/24  Yes Lue Elsie BROCKS, MD  sucralfate  (CARAFATE ) 1 GM/10ML suspension Take 1 g by mouth 4 (four) times daily.   Yes [provider]  Blood Glucose-BP Monitor (BLOOD GLUCOSE-WRIST BP MONITOR) DEVI 1 Bag by Does not apply route in the morning, at noon, and at bedtime. 04/24/24   Celestia Rosaline SQUIBB, NP  Blood Pressure Monitoring (BLOOD PRESSURE MONITOR AUTOMAT) DEVI 1 kit by Does not apply route in the morning, at noon, and at bedtime. 05/16/24   Celestia Rosaline SQUIBB, NP  ferrous sulfate  325 (65 FE) MG tablet Take 1 tablet (325 mg total) by mouth daily with breakfast. Patient not taking: Reported on 11/12/2024 08/23/24   Franchot Novel, MD  Multiple Vitamin (MULTIVITAMIN WITH MINERALS) TABS tablet Take 1  tablet by mouth daily. Patient not taking: Reported on 11/12/2024 03/02/24   Celestia Rosaline SQUIBB, NP  polyethylene glycol (MIRALAX ) 17 g packet Take 17 g by mouth daily. Patient not taking: Reported on 11/12/2024 08/22/24   Franchot Novel, MD  potassium chloride  SA (KLOR-CON  M) 20 MEQ tablet Take 1 tablet (20 mEq total) by mouth daily. Patient not taking: Reported on 11/12/2024 08/22/24   Franchot Novel, MD  thiamine  (VITAMIN B-1) 100 MG tablet Take 1 tablet (100 mg total) by mouth daily. Patient not taking: Reported on 11/12/2024 08/23/24   Franchot Novel, MD    Allergies: Ativan  [lorazepam ] and Nsaids    Review of Systems  Gastrointestinal:  Positive for abdominal pain, nausea and vomiting.    Updated Vital Signs BP (!) 151/112   Pulse 91   Temp (!) 97.4 F (36.3 C) (Oral)   Resp 17   SpO2 99%   Physical Exam Vitals and nursing note reviewed.  Constitutional:      General: Richard Lopez is not in acute distress.    Appearance: Richard Lopez is not toxic-appearing.  HENT:     Head: Normocephalic and atraumatic.  Eyes:     General: No scleral icterus.    Conjunctiva/sclera: Conjunctivae normal.  Cardiovascular:     Rate and Rhythm: Regular rhythm. Tachycardia present.     Pulses: Normal pulses.     Heart sounds: Normal heart sounds.  Pulmonary:     Effort: Pulmonary effort is normal. No respiratory distress.  Breath sounds: Normal breath sounds.  Abdominal:     General: Abdomen is flat. Bowel sounds are normal.     Palpations: Abdomen is soft.     Tenderness: There is no abdominal tenderness.  Musculoskeletal:     Right lower leg: No edema.     Left lower leg: No edema.  Skin:    General: Skin is warm and dry.     Findings: No lesion.  Neurological:     General: No focal deficit present.     Mental Status: Richard Lopez is alert and oriented to person, place, and time. Mental status is at baseline.     (all labs ordered are listed, but only abnormal results are displayed) Labs  Reviewed  COMPREHENSIVE METABOLIC PANEL WITH GFR - Abnormal; Notable for the following components:      Result Value   Sodium 130 (*)    Chloride 85 (*)    CO2 21 (*)    Glucose, Bld 183 (*)    BUN 23 (*)    Creatinine, Ser 2.93 (*)    Total Protein 8.9 (*)    GFR, Estimated 26 (*)    Anion gap 25 (*)    All other components within normal limits  MAGNESIUM  - Abnormal; Notable for the following components:   Magnesium  1.5 (*)    All other components within normal limits  I-STAT CHEM 8, ED - Abnormal; Notable for the following components:   Sodium 126 (*)    Potassium 5.6 (*)    Chloride 90 (*)    BUN 33 (*)    Creatinine, Ser 3.10 (*)    Glucose, Bld 195 (*)    Calcium , Ion 0.98 (*)    All other components within normal limits  I-STAT CG4 LACTIC ACID, ED - Abnormal; Notable for the following components:   Lactic Acid, Venous 4.2 (*)    All other components within normal limits  CBC  ETHANOL  LACTIC ACID, PLASMA  CK  LIPASE, BLOOD  URINALYSIS, ROUTINE W REFLEX MICROSCOPIC  LACTIC ACID, PLASMA  BASIC METABOLIC PANEL WITH GFR  BASIC METABOLIC PANEL WITH GFR  BASIC METABOLIC PANEL WITH GFR  HEMOGLOBIN AND HEMATOCRIT, BLOOD  HEMOGLOBIN AND HEMATOCRIT, BLOOD  URINE DRUG SCREEN  BETA-HYDROXYBUTYRIC ACID  POC OCCULT BLOOD, ED  TYPE AND SCREEN    EKG: EKG Interpretation Date/Time:  Saturday November 12 2024 09:36:14 EST Ventricular Rate:  98 PR Interval:  141 QRS Duration:  99 QT Interval:  367 QTC Calculation: 469 R Axis:   62  Text Interpretation: Sinus rhythm Biatrial enlargement Minimal ST depression, diffuse leads Confirmed by Neysa Clap [phone]) on 11/12/2024 10:19:12 AM  Radiology: No results found.   Procedures   Medications Ordered in the ED  thiamine  (VITAMIN B1) tablet 100 mg (100 mg Oral Given 11/12/24 1315)    Or  thiamine  (VITAMIN B1) injection 100 mg ( Intravenous See Alternative 11/12/24 1315)  folic acid  (FOLVITE ) tablet 1 mg (1 mg Oral Given  11/12/24 1315)  multivitamin with minerals tablet 1 tablet (1 tablet Oral Given 11/12/24 1315)  magnesium  sulfate IVPB 2 g 50 mL (2 g Intravenous New Bag/Given 11/12/24 1310)  0.9 %  sodium chloride  infusion ( Intravenous New Bag/Given 11/12/24 1309)  pantoprazole  (PROTONIX ) injection 40 mg (has no administration in time range)  acetaminophen  (TYLENOL ) tablet 650 mg (has no administration in time range)    Or  acetaminophen  (TYLENOL ) suppository 650 mg (has no administration in time range)  ondansetron  (ZOFRAN ) tablet 4  mg (has no administration in time range)    Or  ondansetron  (ZOFRAN ) injection 4 mg (has no administration in time range)  pantoprazole  (PROTONIX ) injection 80 mg (80 mg Intravenous Given 11/12/24 0958)  ondansetron  (ZOFRAN ) injection 4 mg (4 mg Intravenous Given 11/12/24 0957)  sodium chloride  0.9 % bolus 1,000 mL (0 mLs Intravenous Stopped 11/12/24 1110)  sodium chloride  0.9 % bolus 1,000 mL (0 mLs Intravenous Stopped 11/12/24 1236)  metoCLOPramide  (REGLAN ) injection 5 mg (5 mg Intravenous Given 11/12/24 1311)  sodium chloride  0.9 % bolus 1,000 mL (1,000 mLs Intravenous New Bag/Given 11/12/24 1310)                                    Medical Decision Making Amount and/or Complexity of Data Reviewed Labs: ordered.  Risk OTC drugs. Prescription drug management. Decision regarding hospitalization.   This patient presents to the ED for concern of abdominal pain, this involves an extensive number of treatment options, and is a complaint that carries with it a high risk of complications and morbidity.  The differential diagnosis includes cholecystitis, AAA, appendicitis, renal stone, UTI   Co morbidities that complicate the patient evaluation  Cannabis abuse, alcohol abuse, esophageal ulcers with esophagitis, hypertension, history of PE and paroxysmal A-fib    Additional history obtained:  Additional history obtained from patient was seen 08/22/2021 2025 for similar  complaint and admitted for AKI and electrolyte abnormality.   Lab Tests:  I personally interpreted labs.  The pertinent results include:   CBC with no leukocytosis, no significant anemia.  EtOH, CK and lipase within normal limits. Initial lactic was 4.2, repeat is within normal limits. CMP is remarkable for low sodium, low magnesium , elevated creatinine, anion gap of 25.   Cardiac Monitoring: / EKG:  The patient was maintained on a cardiac monitor.  I personally viewed and interpreted the cardiac monitored which showed an underlying rhythm of: sinus tachycardia    Consultations Obtained:  I requested consultation with GI Dr Elicia ,  and discussed lab and imaging findings as well as pertinent plan - they recommend: will see in consult, will see the patient tomorrow morning.  Discussed with hospitalist Dr. Celinda who agrees to admission.   Problem List / ED Course / Critical interventions / Medication management  Presents to emergency room stable, well-appearing mildly tachycardic.  Richard Lopez is protecting his airway.  Richard Lopez has normal mentation.  Richard Lopez complains of excessive nausea and vomiting after excessive alcohol intake this last week.  Richard Lopez has been vomiting excessively and started vomiting blood today.  This is dark red blood.  Richard Lopez has no definitive diagnosis of esophageal varices nor cirrhosis, does have history of esophagitis/esophageal ulcers.  Richard Lopez is not on blood thinner.  Abdomen is soft and nontender.  Will obtain i-STAT Chem-8, type and screen and lab work.  Will give Protonix , normal saline Zofran  and reassess closely. No tremor, no reported history of EtOH withdrawal/seizure.  Patient continues to have vomiting with infrequent episodes of hematemesis.  His vital signs have been normal throughout stay in ED.  His hemoglobin is found to be normal.  Richard Lopez does have significant AKI and several electrolyte abnormalities.  Richard Lopez has large anion gap which I suspect is due to starvation ketosis, lactic  acidosis.  Given continued blood in vomit I will consult to GI.  Patient will require admission for further workup. I have reviewed the patients home medicines and  have made adjustments as needed      Final diagnoses:  Hematemesis with nausea  ETOH abuse  AKI (acute kidney injury)  Hypomagnesemia    ED Discharge Orders     None          Shermon Warren SAILOR, PA-C 11/12/24 1400    Neysa Caron PARAS, DO 11/12/24 1453  "

## 2024-11-12 NOTE — ED Triage Notes (Signed)
 Pt BIB from home c/o heavy drinking Friday-Sunday, Monday started having n/v/d, and then muscle cramps on Thursdays. Pt actively vomiting  in triage

## 2024-11-12 NOTE — H&P (Signed)
 " History and Physical    Patient: Richard Lopez FMW:[id number] DOB: Oct 01, 1976 DOA: 11/12/2024 DOS: the patient was seen and examined on 11/12/2024 PCP: Celestia Rosaline SQUIBB, NP  Patient coming from: Home  Chief Complaint:  Chief Complaint  Patient presents with   Nausea   HPI: Richard Lopez is a 49 y.o. male with medical history significant of anxiety, osteoarthritis, depression, diet-controlled type 2 diabetes, essential hypertension, gastritis, gout, history of acute blood loss anemia, pulmonary embolism, cavitary lesion in the lung, cannabis hyperemesis syndrome who presented to the emergency department with complaints of abdominal pain, persistent nausea, multiple episodes of emesis, including an episode of hematemesis this morning, after he binge alcohol this past weekend.   No diarrhea, constipation, melena or hematochezia.  No flank pain, dysuria, frequency or hematuria.He denied fever, chills, rhinorrhea, sore throat, wheezing or hemoptysis.  No chest pain, palpitations, diaphoresis, PND, orthopnea or pitting edema of the lower extremities.  No polyuria, polydipsia, polyphagia or blurred vision.   ED course: Initial vital signs were temperature 97.4 F, pulse 106, respirations 16, BP 158/119 mmHg and O2 sat 100% on room air.  Lab work: CBC showed a white count was 7.7, hemoglobin 15.4 g/dL and platelets 827.  Lactic acid was 4.2 then 1.9 mmol/L.  Normal alcohol, total CK and lipase level.  Magnesium  was 1.5 mg/dL.  CMP showed a sodium 130 (corrected at 132), potassium 3.7, chloride 85 and CO2 21 mmol/L with an anion gap of 25.  Glucose under 83, BUN 23 and creatinine 2.93 mg/dL.  Total protein was 8.9 g/dL, but the rest of the LFTs were normal.  Unremarkable calcium  level.  Review of Systems: As mentioned in the history of present illness. All other systems reviewed and are negative. Past Medical History:  Diagnosis Date   Alcohol dependence with intoxication (HCC) 03/25/2019   Anxiety     Arthritis    hands and possibly knee   Atrial fibrillation with RVR (HCC) 04/22/2020   Closed right hip fracture, initial encounter (HCC) 03/25/2019   Depression    Diabetes mellitus    diet controlled   Esophagitis determined by endoscopy 08/21/2019   Essential hypertension    Gastritis    Gout    Intertrochanteric fracture of right hip (HCC) 04/14/2019   Normocytic anemia due to blood loss 08/21/2019   Pulmonary embolism [facility])    Past Surgical History:  Procedure Laterality Date   BIOPSY  11/12/2022   Procedure: BIOPSY;  Surgeon: Saintclair Jasper, MD;  [facility];  Service: Gastroenterology;;   ESOPHAGOGASTRODUODENOSCOPY N/A 04/12/2024   Procedure: EGD (ESOPHAGOGASTRODUODENOSCOPY);  Surgeon: Saintclair Jasper, MD;  Location: [facility];  Service: Gastroenterology;  Laterality: N/A;   ESOPHAGOGASTRODUODENOSCOPY (EGD) WITH PROPOFOL  N/A 08/14/2019   Procedure: ESOPHAGOGASTRODUODENOSCOPY (EGD) WITH PROPOFOL ;  Surgeon: Dianna Specking, MD;  [facility];  Service: Endoscopy;  Laterality: N/A;   ESOPHAGOGASTRODUODENOSCOPY (EGD) WITH PROPOFOL  N/A 11/12/2022   Procedure: ESOPHAGOGASTRODUODENOSCOPY (EGD) WITH PROPOFOL ;  Surgeon: Saintclair Jasper, MD;  [facility];  Service: Gastroenterology;  Laterality: N/A;   EXTERNAL FIXATION LEG Left 11/07/2018   Procedure: EXTERNAL FIXATION LEFT LOWER LEG;  Surgeon: Fidel Rogue, MD;  [facility];  Service: Orthopedics;  Laterality: Left;   EXTERNAL FIXATION REMOVAL Left 11/08/2018   Procedure: REMOVAL EXTERNAL FIXATION LEG;  Surgeon: Kendal Franky SQUIBB, MD;  [facility];  Service: Orthopedics;  Laterality: Left;   INTRAMEDULLARY (IM) NAIL INTERTROCHANTERIC Right 03/26/2019   Procedure: INTRAMEDULLARY (IM) NAIL INTERTROCHANTRIC;  Surgeon: Kendal Franky  P, MD;  [facility];  Service: Orthopedics;  Laterality: Right;   INTRAMEDULLARY (IM) NAIL INTERTROCHANTERIC Right 05/17/2019   Procedure: Intramedullary (Im) Nail Intertroch  with circlage wiring;  Surgeon: Celena Sharper, MD;  [facility];  Service: Orthopedics;  Laterality: Right;   NO PAST SURGERIES     OPEN REDUCTION INTERNAL FIXATION (ORIF) TIBIA/FIBULA FRACTURE Left 11/08/2018   Procedure: OPEN REDUCTION INTERNAL FIXATION (ORIF) TIBIA/FIBULA FRACTURE;  Surgeon: Kendal Franky SQUIBB, MD;  [facility];  Service: Orthopedics;  Laterality: Left;   ORIF FEMUR FRACTURE Right 05/17/2019   Procedure: REMOVAL  OF HARDWARE;  Surgeon: Celena Sharper, MD;  [facility];  Service: Orthopedics;  Laterality: Right;   Social History:  reports that he has quit smoking. His smoking use included cigarettes. He has never used smokeless tobacco. He reports current alcohol use of about 200.0 standard drinks of alcohol per week. He reports current drug use. Drug: Marijuana.  Allergies[1]  Family History  Problem Relation Age of Onset   Diabetes Mellitus II Father    Diabetes Mellitus II Other    CAD Other     Prior to Admission medications  Medication Sig Start Date End Date Taking? Authorizing Provider  amLODipine  (NORVASC ) 10 MG tablet Take 10 mg by mouth daily.    [provider]  Blood Glucose-BP Monitor (BLOOD GLUCOSE-WRIST BP MONITOR) DEVI 1 Bag by Does not apply route in the morning, at noon, and at bedtime. 04/24/24   Celestia Rosaline SQUIBB, NP  Blood Pressure Monitoring (BLOOD PRESSURE MONITOR AUTOMAT) DEVI 1 kit by Does not apply route in the morning, at noon, and at bedtime. 05/16/24   Celestia Rosaline SQUIBB, NP  ferrous sulfate  325 (65 FE) MG tablet Take 1 tablet (325 mg total) by mouth daily with breakfast. 08/23/24   Franchot Novel, MD  Multiple Vitamin (MULTIVITAMIN WITH MINERALS) TABS tablet Take 1 tablet by mouth daily. 03/02/24   Celestia Rosaline SQUIBB, NP  ondansetron  (ZOFRAN ) 4 MG tablet Take 1 tablet (4 mg total) by mouth every 6 (six) hours as needed for nausea. 08/26/24   Lue Elsie BROCKS, MD  pantoprazole  (PROTONIX ) 40 MG tablet Take 1 tablet (40  mg total) by mouth 2 (two) times daily before a meal. 08/26/24   Lue Elsie BROCKS, MD  polyethylene glycol (MIRALAX ) 17 g packet Take 17 g by mouth daily. 08/22/24   Franchot Novel, MD  potassium chloride  SA (KLOR-CON  M) 20 MEQ tablet Take 1 tablet (20 mEq total) by mouth daily. 08/22/24   Franchot Novel, MD  sucralfate  (CARAFATE ) 1 GM/10ML suspension Take 1 g by mouth 4 (four) times daily.    [provider]  thiamine  (VITAMIN B-1) 100 MG tablet Take 1 tablet (100 mg total) by mouth daily. 08/23/24   Franchot Novel, MD    Physical Exam: Vitals:   11/12/24 0945 11/12/24 1000 11/12/24 1015 11/12/24 1030  BP: (!) 163/109 (!) 167/126 (!) 175/125 (!) 151/112  Pulse: 95 98 99 91  Resp: 20 16 15 17   Temp:      TempSrc:      SpO2: 100% 99% 100% 99%   Physical Exam Vitals reviewed.  Constitutional:      General: He is awake. He is not in acute distress.    Appearance: He is ill-appearing.  HENT:     Head: Normocephalic.     Nose: No rhinorrhea.     Mouth/Throat:     Mouth: Mucous membranes are dry.  Eyes:  General: No scleral icterus.    Pupils: Pupils are equal, round, and reactive to light.  Neck:     Vascular: No JVD.  Cardiovascular:     Rate and Rhythm: Regular rhythm. Tachycardia present.  Pulmonary:     Effort: Pulmonary effort is normal.     Breath sounds: Normal breath sounds. No wheezing, rhonchi or rales.  Abdominal:     General: Bowel sounds are normal. There is no distension.     Palpations: Abdomen is soft.     Tenderness: There is no abdominal tenderness. There is no right CVA tenderness or left CVA tenderness.  Musculoskeletal:     Cervical back: Neck supple.     Right lower leg: No edema.     Left lower leg: No edema.  Skin:    General: Skin is warm and dry.  Neurological:     General: No focal deficit present.     Mental Status: He is alert and oriented to person, place, and time.  Psychiatric:        Mood and Affect: Mood normal.         Behavior: Behavior normal. Behavior is cooperative.     Data Reviewed:  Results are pending, will review when available.  04/22/2020 transthoracic echocardiogram report.  IMPRESSIONS:   1. Left ventricular ejection fraction, by estimation, is 55 to 60%. The  left ventricle has normal function. The left ventricle has no regional  wall motion abnormalities. There is moderate concentric left ventricular  hypertrophy. Left ventricular  diastolic parameters are indeterminate.   2. Right ventricular systolic function is normal. The right ventricular  size is normal. Tricuspid regurgitation signal is inadequate for assessing  PA pressure.   3. Left atrial size was severely dilated.   4. The mitral valve is normal in structure. Trivial mitral valve  regurgitation. No evidence of mitral stenosis.   5. The aortic valve is tricuspid. Aortic valve regurgitation is not  visualized. Mild aortic valve sclerosis is present, with no evidence of  aortic valve stenosis.   6. The inferior vena cava is dilated in size with <50% respiratory  variability, suggesting right atrial pressure of 15 mmHg.   EKG: Vent. rate 98 BPM PR interval 141 ms QRS duration 99 ms QT/QTcB 367/469 ms P-R-T axes 74 62 78 Sinus rhythm Biatrial enlargement Minimal ST depression, diffuse leads  Assessment and Plan: Principal Problem:   AKI (acute kidney injury) (HCC) Associated with:   High anion gap metabolic acidosis In the setting of:   Dehydration Observation/telemetry. Continue IV fluids. Avoid hypotension. Avoid nephrotoxins. Monitor intake and output. Monitor renal function and electrolytes.   Active Problems:   Upper GI bleed Denied NSAID use. Keep n.p.o. for now. Monitor hematocrit and hemoglobin. Transfuse PRBC as needed. GI already consulted by ED.     Gastroesophageal reflux without esophagitis On pantoprazole  as above.    Hyponatremia Secondary to GI losses. Continue normal  saline infusion. Follow-up sodium level this evening. Follow-up sodium level in the morning.    Hypomagnesemia Secondary to alcohol use. Replacement given. Follow-up level as needed.    Alcohol abuse Cessation advised. Has not had alcohol since the weekend. However, we will monitor for withdrawal symptoms.    Hyperproteinemia Secondary to hemoconcentration. Continue IV hydration.    AF (paroxysmal atrial fibrillation) (HCC) CHA2DS2-VASc Score of at least 3. Currently not on anticoagulation.     Type 2 diabetes mellitus with hyperglycemia (HCC) Currently NPO. CBG monitoring every 6 hours.  Essential hypertension Resume amlodipine  10 mg p.o. daily. Monitor blood pressure and heart rate.     Advance Care Planning:   Code Status: Full Code   Consults: Gastroenterology.  Family Communication:   Severity of Illness: The appropriate patient status for this patient is OBSERVATION. Observation status is judged to be reasonable and necessary in order to provide the required intensity of service to ensure the patient's safety. The patient's presenting symptoms, physical exam findings, and initial radiographic and laboratory data in the context of their medical condition is felt to place them at decreased risk for further clinical deterioration. Furthermore, it is anticipated that the patient will be medically stable for discharge from the hospital within 2 midnights of admission.   Author: Alm Dorn Castor, MD 11/12/2024 12:38 PM  For on call review [email or web address].   This document was prepared using Dragon voice recognition software and may contain some unintended transcription errors.     [1]  Allergies Allergen Reactions   Ativan  [Lorazepam ] Anxiety and Other (See Comments)    Hallucinations   Nsaids Other (See Comments)    Hx of esophageal ulcer   "

## 2024-11-13 ENCOUNTER — Encounter (HOSPITAL_COMMUNITY): Payer: Self-pay | Admitting: Internal Medicine

## 2024-11-13 DIAGNOSIS — F1010 Alcohol abuse, uncomplicated: Secondary | ICD-10-CM | POA: Diagnosis not present

## 2024-11-13 DIAGNOSIS — N179 Acute kidney failure, unspecified: Secondary | ICD-10-CM

## 2024-11-13 DIAGNOSIS — I480 Paroxysmal atrial fibrillation: Secondary | ICD-10-CM

## 2024-11-13 DIAGNOSIS — E8729 Other acidosis: Secondary | ICD-10-CM

## 2024-11-13 DIAGNOSIS — E1165 Type 2 diabetes mellitus with hyperglycemia: Secondary | ICD-10-CM | POA: Diagnosis not present

## 2024-11-13 DIAGNOSIS — E860 Dehydration: Secondary | ICD-10-CM

## 2024-11-13 DIAGNOSIS — K219 Gastro-esophageal reflux disease without esophagitis: Secondary | ICD-10-CM

## 2024-11-13 DIAGNOSIS — E871 Hypo-osmolality and hyponatremia: Secondary | ICD-10-CM | POA: Diagnosis not present

## 2024-11-13 DIAGNOSIS — I10 Essential (primary) hypertension: Secondary | ICD-10-CM

## 2024-11-13 DIAGNOSIS — Z794 Long term (current) use of insulin: Secondary | ICD-10-CM

## 2024-11-13 DIAGNOSIS — K922 Gastrointestinal hemorrhage, unspecified: Secondary | ICD-10-CM | POA: Diagnosis not present

## 2024-11-13 LAB — GLUCOSE, CAPILLARY
Glucose-Capillary: 99 mg/dL (ref 70–99)
Glucose-Capillary: 95 mg/dL (ref 70–99)
Glucose-Capillary: 103 mg/dL — ABNORMAL HIGH (ref 70–99)

## 2024-11-13 LAB — COMPREHENSIVE METABOLIC PANEL WITH GFR
ALT: 8 U/L (ref 0–44)
AST: 27 U/L (ref 15–41)
Albumin: 4.1 g/dL (ref 3.5–5.0)
Alkaline Phosphatase: 71 U/L (ref 38–126)
Anion gap: 20 — ABNORMAL HIGH (ref 5–15)
BUN: 23 mg/dL — ABNORMAL HIGH (ref 6–20)
CO2: 19 mmol/L — ABNORMAL LOW (ref 22–32)
Calcium: 8.5 mg/dL — ABNORMAL LOW (ref 8.9–10.3)
Chloride: 91 mmol/L — ABNORMAL LOW (ref 98–111)
Creatinine, Ser: 1.69 mg/dL — ABNORMAL HIGH (ref 0.61–1.24)
GFR, Estimated: 50 mL/min — ABNORMAL LOW
Glucose, Bld: 98 mg/dL (ref 70–99)
Potassium: 3.2 mmol/L — ABNORMAL LOW (ref 3.5–5.1)
Sodium: 130 mmol/L — ABNORMAL LOW (ref 135–145)
Total Bilirubin: 0.8 mg/dL (ref 0.0–1.2)
Total Protein: 7.3 g/dL (ref 6.5–8.1)

## 2024-11-13 LAB — CBC
HCT: 31.2 % — ABNORMAL LOW (ref 39.0–52.0)
Hemoglobin: 10.9 g/dL — ABNORMAL LOW (ref 13.0–17.0)
MCH: 30.4 pg (ref 26.0–34.0)
MCHC: 34.9 g/dL (ref 30.0–36.0)
MCV: 87.2 fL (ref 80.0–100.0)
Platelets: 133 K/uL — ABNORMAL LOW (ref 150–400)
RBC: 3.58 MIL/uL — ABNORMAL LOW (ref 4.22–5.81)
RDW: 13 % (ref 11.5–15.5)
WBC: 7 K/uL (ref 4.0–10.5)
nRBC: 0 % (ref 0.0–0.2)

## 2024-11-13 LAB — MAGNESIUM: Magnesium: 1.9 mg/dL (ref 1.7–2.4)

## 2024-11-13 MED ORDER — POTASSIUM CHLORIDE CRYS ER 20 MEQ PO TBCR
40.0000 meq | EXTENDED_RELEASE_TABLET | Freq: Once | ORAL | Status: AC
  Administered 2024-11-13: 40 meq via ORAL
  Filled 2024-11-13: qty 2

## 2024-11-13 MED ORDER — POTASSIUM CHLORIDE CRYS ER 20 MEQ PO TBCR: 40.0000 meq | EXTENDED_RELEASE_TABLET | ORAL | Status: DC

## 2024-11-13 MED ORDER — POTASSIUM CHLORIDE IN NACL 20-0.9 MEQ/L-% IV SOLN
INTRAVENOUS | Status: AC
  Filled 2024-11-13 (×3): qty 1000

## 2024-11-13 MED ORDER — POTASSIUM CHLORIDE 10 MEQ/100ML IV SOLN
10.0000 meq | INTRAVENOUS | Status: AC
  Administered 2024-11-13 (×3): 10 meq via INTRAVENOUS
  Filled 2024-11-13 (×3): qty 100

## 2024-11-13 NOTE — H&P (View-Only) (Signed)
 Referring Provider: TH Primary Care Physician:  Celestia Rosaline SQUIBB, NP Primary Gastroenterologist:  Dr. Saintclair  Reason for Consultation: Hematemesis  HPI: Richard Lopez is a 49 y.o. male with past medical history of alcohol abuse, history of esophagitis, history of marijuana use, history of multiple admission in the past for coffee-ground emesis and hematemesis presented to the hospital again with hematemesis.  According to patient, he was binge drinking alcohol for 3 days and subsequently started having nausea vomiting and then started seeing fresh blood in the vomiting.  His symptoms have improved now.  No further vomiting this morning.  He denies any abdominal pain, diarrhea or constipation.  Denies seeing any black stool or blood in the stool.  EGD in June 2025 showed severe LA grade D esophagitis.  Repeat EGD as outpatient in August 2025 by Dr. Saintclair showed normal esophagus, 5 cm hiatal hernia mild gastritis.  Biopsies were negative for H. pylori.  Colonoscopy in August 2025 showed small tubular adenoma, diverticulosis and internal hemorrhoids.  Repeat was recommended in 5 years.    Past Medical History:  Diagnosis Date   Alcohol dependence with intoxication (HCC) 03/25/2019   Anxiety    Arthritis    hands and possibly knee   Atrial fibrillation with RVR (HCC) 04/22/2020   Cannabinoid hyperemesis syndrome 07/29/2014   Cavitary lesion of lung 08/01/2019   Closed right hip fracture, initial encounter (HCC) 03/25/2019   Depression    Diabetes mellitus    diet controlled   Esophagitis determined by endoscopy 08/21/2019   Essential hypertension    Gastritis    Gout    Intertrochanteric fracture of right hip (HCC) 04/14/2019   Normocytic anemia due to blood loss 08/21/2019   Pulmonary embolism [facility])     Past Surgical History:  Procedure Laterality Date   BIOPSY  11/12/2022   Procedure: BIOPSY;  Surgeon: Saintclair Jasper, MD;  [facility];  Service: Gastroenterology;;    ESOPHAGOGASTRODUODENOSCOPY N/A 04/12/2024   Procedure: EGD (ESOPHAGOGASTRODUODENOSCOPY);  Surgeon: Saintclair Jasper, MD;  Location: [facility];  Service: Gastroenterology;  Laterality: N/A;   ESOPHAGOGASTRODUODENOSCOPY (EGD) WITH PROPOFOL  N/A 08/14/2019   Procedure: ESOPHAGOGASTRODUODENOSCOPY (EGD) WITH PROPOFOL ;  Surgeon: Dianna Specking, MD;  [facility];  Service: Endoscopy;  Laterality: N/A;   ESOPHAGOGASTRODUODENOSCOPY (EGD) WITH PROPOFOL  N/A 11/12/2022   Procedure: ESOPHAGOGASTRODUODENOSCOPY (EGD) WITH PROPOFOL ;  Surgeon: Saintclair Jasper, MD;  [facility];  Service: Gastroenterology;  Laterality: N/A;   EXTERNAL FIXATION LEG Left 11/07/2018   Procedure: EXTERNAL FIXATION LEFT LOWER LEG;  Surgeon: Fidel Rogue, MD;  [facility];  Service: Orthopedics;  Laterality: Left;   EXTERNAL FIXATION REMOVAL Left 11/08/2018   Procedure: REMOVAL EXTERNAL FIXATION LEG;  Surgeon: Kendal Franky SQUIBB, MD;  [facility];  Service: Orthopedics;  Laterality: Left;   INTRAMEDULLARY (IM) NAIL INTERTROCHANTERIC Right 03/26/2019   Procedure: INTRAMEDULLARY (IM) NAIL INTERTROCHANTRIC;  Surgeon: Kendal Franky SQUIBB, MD;  [facility];  Service: Orthopedics;  Laterality: Right;   INTRAMEDULLARY (IM) NAIL INTERTROCHANTERIC Right 05/17/2019   Procedure: Intramedullary (Im) Nail Intertroch with circlage wiring;  Surgeon: Celena Sharper, MD;  [facility];  Service: Orthopedics;  Laterality: Right;   NO PAST SURGERIES     OPEN REDUCTION INTERNAL FIXATION (ORIF) TIBIA/FIBULA FRACTURE Left 11/08/2018   Procedure: OPEN REDUCTION INTERNAL FIXATION (ORIF) TIBIA/FIBULA FRACTURE;  Surgeon: Kendal Franky SQUIBB, MD;  Location: [facility];  Service: Orthopedics;  Laterality: Left;   ORIF FEMUR FRACTURE Right 05/17/2019   Procedure: REMOVAL  OF HARDWARE;  Surgeon: Celena Sharper, MD;  Location: [facility] OR;  Service: Orthopedics;  Laterality: Right;    Prior to Admission medications  Medication Sig Start Date End Date Taking?  Authorizing Provider  amLODipine  (NORVASC ) 10 MG tablet Take 10 mg by mouth daily.   Yes [provider]  ondansetron  (ZOFRAN ) 4 MG tablet Take 1 tablet (4 mg total) by mouth every 6 (six) hours as needed for nausea. 08/26/24  Yes Lue Elsie BROCKS, MD  pantoprazole  (PROTONIX ) 40 MG tablet Take 1 tablet (40 mg total) by mouth 2 (two) times daily before a meal. 08/26/24  Yes Lue Elsie BROCKS, MD  sucralfate  (CARAFATE ) 1 GM/10ML suspension Take 1 g by mouth 4 (four) times daily.   Yes [provider]  Blood Glucose-BP Monitor (BLOOD GLUCOSE-WRIST BP MONITOR) DEVI 1 Bag by Does not apply route in the morning, at noon, and at bedtime. 04/24/24   Celestia Rosaline SQUIBB, NP  Blood Pressure Monitoring (BLOOD PRESSURE MONITOR AUTOMAT) DEVI 1 kit by Does not apply route in the morning, at noon, and at bedtime. 05/16/24   Celestia Rosaline SQUIBB, NP  ferrous sulfate  325 (65 FE) MG tablet Take 1 tablet (325 mg total) by mouth daily with breakfast. Patient not taking: Reported on 11/12/2024 08/23/24   Franchot Novel, MD  Multiple Vitamin (MULTIVITAMIN WITH MINERALS) TABS tablet Take 1 tablet by mouth daily. Patient not taking: Reported on 11/12/2024 03/02/24   Celestia Rosaline SQUIBB, NP  polyethylene glycol (MIRALAX ) 17 g packet Take 17 g by mouth daily. Patient not taking: Reported on 11/12/2024 08/22/24   Franchot Novel, MD  potassium chloride  SA (KLOR-CON  M) 20 MEQ tablet Take 1 tablet (20 mEq total) by mouth daily. Patient not taking: Reported on 11/12/2024 08/22/24   Franchot Novel, MD  thiamine  (VITAMIN B-1) 100 MG tablet Take 1 tablet (100 mg total) by mouth daily. Patient not taking: Reported on 11/12/2024 08/23/24   Franchot Novel, MD    Scheduled Meds:  amLODipine   10 mg Oral Daily   folic acid   1 mg Oral Daily   multivitamin with minerals  1 tablet Oral Daily   pantoprazole  (PROTONIX ) IV  40 mg Intravenous Q12H   potassium chloride   40 mEq Oral Once   sucralfate   1 g Oral  TID AC   thiamine   100 mg Oral Daily   Or   thiamine   100 mg Intravenous Daily   Continuous Infusions:  0.9 % NaCl with KCl 20 mEq / L     PRN Meds:.acetaminophen  **OR** acetaminophen , ondansetron  **OR** ondansetron  (ZOFRAN ) IV, mouth rinse  Allergies as of 11/12/2024 - Review Complete 11/12/2024  Allergen Reaction Noted   Ativan  [lorazepam ] Anxiety and Other (See Comments) 08/20/2023   Nsaids Other (See Comments) 08/18/2023    Family History  Problem Relation Age of Onset   Diabetes Mellitus II Father    Diabetes Mellitus II Other    CAD Other     Social History   Socioeconomic History   Marital status: Single    Spouse name: Not on file   Number of children: Not on file   Years of education: Not on file   Highest education level: Some college, no degree  Occupational History   Not on file  Tobacco Use   Smoking status: Former    Types: Cigarettes   Smokeless tobacco: Never   Tobacco comments:    About 1 cigarette per day or less  Vaping Use   Vaping status: Never Used  Substance and Sexual Activity  Alcohol use: Yes    Alcohol/week: 200.0 standard drinks of alcohol    Types: 200 Cans of beer per week   Drug use: Yes    Types: Marijuana   Sexual activity: Yes  Other Topics Concern   Not on file  Social History Narrative   Not on file   Social Drivers of Health   Tobacco Use: Medium Risk (11/13/2024)   Patient History    Smoking Tobacco Use: Former    Smokeless Tobacco Use: Never    Passive Exposure: Not on file  Financial Resource Strain: Medium Risk (06/24/2024)   Overall Financial Resource Strain (CARDIA)    Difficulty of Paying Living Expenses: Somewhat hard  Food Insecurity: No Food Insecurity (11/12/2024)   Epic    Worried About Programme Researcher, Broadcasting/film/video in the Last Year: Never true    Ran Out of Food in the Last Year: Never true  Transportation Needs: No Transportation Needs (11/12/2024)   Epic    Lack of Transportation (Medical): No    Lack of  Transportation (Non-Medical): No  Physical Activity: Not on file  Stress: Stress Concern Present (12/25/2023)   [employer] of Occupational Health - Occupational Stress Questionnaire    Feeling of Stress : Rather much  Social Connections: Moderately Isolated (09/16/2023)   Social Connection and Isolation Panel    Frequency of Communication with Friends and Family: More than three times a week    Frequency of Social Gatherings with Friends and Family: Never    Attends Religious Services: More than 4 times per year    Active Member of Clubs or Organizations: No    Attends Banker Meetings: Never    Marital Status: Never married  Intimate Partner Violence: Not At Risk (11/12/2024)   Epic    Fear of Current or Ex-Partner: No    Emotionally Abused: No    Physically Abused: No    Sexually Abused: No  Depression (PHQ2-9): Medium Risk (08/31/2024)   Depression (PHQ2-9)    PHQ-2 Score: 8  Alcohol Screen: Low Risk (05/31/2024)   Alcohol Screen    Last Alcohol Screening Score (AUDIT): 3  Housing: Low Risk (11/12/2024)   Epic    Unable to Pay for Housing in the Last Year: No    Number of Times Moved in the Last Year: 0    Homeless in the Last Year: No  Utilities: Not At Risk (11/12/2024)   Epic    Threatened with loss of utilities: No  Health Literacy: Inadequate Health Literacy (09/16/2023)   B1300 Health Literacy    Frequency of need for help with medical instructions: Sometimes    Review of Systems: All negative except as stated above in HPI.  Physical Exam: Vital signs: Vitals:   11/13/24 0311 11/13/24 0612  BP: 126/74 129/77  Pulse: 83 85  Resp: 16 20  Temp: 99.2 F (37.3 C) 99 F (37.2 C)  SpO2: 99% 98%   Last BM Date : 11/11/24 General:   Alert,  Well-developed, well-nourished, pleasant and cooperative in NAD Normocephalic, atraumatic Extraocular movement intact Lungs: No visible respiratory distress Heart:  Regular rate and rhythm; no murmurs,  clicks, rubs,  or gallops. Abdomen: Soft, nontender, nondistended, bowel sound present, no peritoneal signs Mood and affect normal Alert and oriented x 3 Rectal:  Deferred  GI:  Lab Results: Recent Labs    11/12/24 0925 11/12/24 1026 11/12/24 2010 11/12/24 2113 11/13/24 0407  WBC 7.7  --   --   --  7.0  HGB 15.4   < > 11.9* 11.7* 10.9*  HCT 44.3   < > 33.6* 32.8* 31.2*  PLT 172  --   --   --  133*   < > = values in this interval not displayed.   BMET Recent Labs    11/12/24 2010 11/12/24 2113 11/13/24 0407  NA 127* 128* 130*  K 3.3* 3.3* 3.2*  CL 88* 90* 91*  CO2 21* 20* 19*  GLUCOSE 121* 107* 98  BUN 26* 26* 23*  CREATININE 2.24* 2.07* 1.69*  CALCIUM  8.5* 8.5* 8.5*   LFT Recent Labs    11/13/24 0407  PROT 7.3  ALBUMIN  4.1  AST 27  ALT 8  ALKPHOS 71  BILITOT 0.8   PT/INR No results for input(s): LABPROT, INR in the last 72 hours.   Studies/Results: No results found.  Impression/Plan: -Hematemesis in a patient with known history of alcohol use and esophagitis.  Last EGD in August 2025 showed normal-appearing esophagus and 5 cm hiatal hernia. - Acute blood loss anemia.  Likely from above.  - Acute kidney injury.  Improving.  Recommendations -------------------------- - Patient's drop in hemoglobin is likely dilutional for his hydration given for acute kidney injury.  Given his history of esophagitis in the past, I think is reasonable to pursue another endoscopy to rule out recurrent esophagitis from his ongoing alcohol use. - Okay to have full liquid diet today.  Keep n.p.o. past midnight.  Plan for EGD tomorrow. - Discussed with hospitalist.  Risks (bleeding, infection, bowel perforation that could require surgery, sedation-related changes in cardiopulmonary systems), benefits (identification and possible treatment of source of symptoms, exclusion of certain causes of symptoms), and alternatives (watchful waiting, radiographic imaging studies,  empiric medical treatment)  were explained to patient/family in detail and patient wishes to proceed.     LOS: 1 day   Layla Lah  MD, FACP 11/13/2024, 10:00 AM  Contact #  [phone]

## 2024-11-13 NOTE — Progress Notes (Signed)
 " PROGRESS NOTE  Richard Lopez FMW:[id number] DOB: [birth year]   PCP: Celestia Rosaline SQUIBB, NP  Patient is from: Home.  DOA: 11/12/2024 LOS: 1  Chief complaints Chief Complaint  Patient presents with   Nausea     Brief Narrative / Interim history: 49 year old M with PMH of PAF not on anticoagulation, diet-controlled DM-2, HTN, anxiety, depression, gastritis, marijuana use, cannabinoid hyperemesis and tobacco use disorder presenting with intractable nausea, vomiting and abdominal pain with an episode of hematemesis after alcohol binge, and admitted with AKI, hematemesis and intractable nausea and vomiting.  In ED, stable vitals.  Hgb 15.4.  Lactic acid 4.2 but improved to 1.9 quickly.  EtOH, CK and lipase within normal.  Sodium 130.  Creatinine 2.93.  BUN 23.  Patient was started on PPI, IV fluid and admitted.  GI consulted  Subjective: Seen and examined earlier this morning.  No major events overnight or this morning.  Denies further nausea or vomiting since admission.  Last BM yesterday.  Denies melena or hematochezia.  Still with abdominal pain.  Rates his pain 6/10.  Pain is worse with movement.  Last marijuana use 2 weeks ago.   Assessment and plan: AKI (acute kidney injury) (HCC): Likely due to dehydration in the setting of intractable nausea and vomiting.  Improved. -Continue IV fluid hydration   High anion gap metabolic acidosis: Likely due to lactic acidosis and AKI.  Low suspicion for infection.  Hemodynamically stable. -Continue hydration In the setting of:  Intractable nausea, vomiting and abdominal pain: Multifactorial including gastritis, alcohol and marijuana.  Abdominal exam benign. -Continue PPI, Carafate , IV fluid and antiemetics. -Encouraged marijuana and alcohol cessation. -Appreciate help by GI-full liquid diet and n.p.o. after midnight   Hematemesis/upper GI bleed: Denies NSAID use.  Not on anticoagulation.  Baseline Hgb between 11 and 12.  Patient had a CBC  suggestive hemoconcentration.  Drop likely hemodilutional Recent Labs    08/21/24 0525 08/22/24 0433 08/24/24 2047 08/24/24 2052 08/26/24 0610 11/12/24 0925 11/12/24 1026 11/12/24 2010 11/12/24 2113 11/13/24 0407  HGB 12.6* 12.1* 14.7 16.7 11.6* 15.4 16.3 11.9* 11.7* 10.9*  - Continue IV Protonix  40 mg twice daily - Monitor CBC. - GI on board-full liquid diet and endoscopic evaluation tomorrow   Alcohol abuse: Reports drinking about 4 beers a week.  Last drink last week.  No withdrawal symptoms. -Encouraged alcohol cessation - Continue multivitamin, folic acid  and thiamine .  Paroxysmal A-fib: Currently in sinus rhythm.  Not on meds.  CHA2DS2-VASc score 1 for hypertension.  Prediabetes: A1c 6.1% on 10/23.  Not on meds at home. -Monitor glucose with daily labs  Essential hypertension: Normotensive. - Continue home amlodipine   GERD/esophagitis/gastritis-EGD in 06/2024 showed mild gastritis.  Biopsy negative for H. pylori. -IV Protonix  and Carafate  as above.   Hyponatremia: Likely due to dehydration -Continue IV NS with KCl   Hypokalemia/hypomagnesemia -Monitor replenish K and Mg as appropriate  Body mass index is 26.44 kg/m.           DVT prophylaxis:  SCDs Start: 11/12/24 1247  Code Status: Full code Family Communication: None at bedside Level of care: Progressive Status is: Inpatient Remains inpatient appropriate because: Hematemesis/intractable nausea and vomiting and abdominal pain   Final disposition:    55 minutes with more than 50% spent in reviewing records, counseling patient/family and coordinating care.  Consultants:  Gastroenterology  Procedures: None  Microbiology summarized: None  Objective: Vitals:   11/13/24 0108 11/13/24 0311 11/13/24 0544 11/13/24 0612  BP: (!) 149/98 126/74  129/77  Pulse: 85 83  85  Resp: 20 16  20   Temp: 97.9 F (36.6 C) 99.2 F (37.3 C)  99 F (37.2 C)  TempSrc: Oral Oral  Oral  SpO2:  99%  98%   Weight:   103.8 kg   Height:        Examination:  GENERAL: No apparent distress.  Nontoxic. HEENT: MMM.  Vision and hearing grossly intact.  NECK: Supple.  No apparent JVD.  RESP:  No IWOB.  Fair aeration bilaterally. CVS:  RRR. Heart sounds normal.  ABD/GI/GU: BS+. Abd soft, NTND.  MSK/EXT:  Moves extremities. No apparent deformity. No edema.  SKIN: no apparent skin lesion or wound NEURO: AA.  Oriented appropriately.  No apparent focal neuro deficit. PSYCH: Calm. Normal affect.   Sch Meds:  Scheduled Meds:  amLODipine   10 mg Oral Daily   folic acid   1 mg Oral Daily   multivitamin with minerals  1 tablet Oral Daily   pantoprazole  (PROTONIX ) IV  40 mg Intravenous Q12H   sucralfate   1 g Oral TID AC   thiamine   100 mg Oral Daily   Or   thiamine   100 mg Intravenous Daily   Continuous Infusions:  0.9 % NaCl with KCl 20 mEq / L 125 mL/hr at 11/13/24 1013   PRN Meds:.acetaminophen  **OR** acetaminophen , ondansetron  **OR** ondansetron  (ZOFRAN ) IV, mouth rinse  Antimicrobials: Anti-infectives (From admission, onward)    None        I have personally reviewed the following labs and images: CBC: Recent Labs  Lab 11/12/24 0925 11/12/24 1026 11/12/24 2010 11/12/24 2113 11/13/24 0407  WBC 7.7  --   --   --  7.0  HGB 15.4 16.3 11.9* 11.7* 10.9*  HCT 44.3 48.0 33.6* 32.8* 31.2*  MCV 87.2  --   --   --  87.2  PLT 172  --   --   --  133*   BMP &GFR Recent Labs  Lab 11/12/24 1026 11/12/24 1133 11/12/24 2010 11/12/24 2113 11/13/24 0407  NA 126* 130* 127* 128* 130*  K 5.6* 3.7 3.3* 3.3* 3.2*  CL 90* 85* 88* 90* 91*  CO2  --  21* 21* 20* 19*  GLUCOSE 195* 183* 121* 107* 98  BUN 33* 23* 26* 26* 23*  CREATININE 3.10* 2.93* 2.24* 2.07* 1.69*  CALCIUM   --  9.7 8.5* 8.5* 8.5*  MG  --  1.5*  --   --  1.9   Estimated Creatinine Clearance: 69.9 mL/min (A) (by C-G formula based on SCr of 1.69 mg/dL (H)). Liver & Pancreas: Recent Labs  Lab 11/12/24 1133 11/13/24 0407   AST 31 27  ALT 11 8  ALKPHOS 96 71  BILITOT 0.8 0.8  PROT 8.9* 7.3  ALBUMIN  4.7 4.1   Recent Labs  Lab 11/12/24 1133  LIPASE 37   No results for input(s): AMMONIA in the last 168 hours. Diabetic: No results for input(s): HGBA1C in the last 72 hours. Recent Labs  Lab 11/13/24 0609 11/13/24 1140  GLUCAP 99 95   Cardiac Enzymes: Recent Labs  Lab 11/12/24 1133  CKTOTAL 188   No results for input(s): PROBNP in the last 8760 hours. Coagulation Profile: No results for input(s): INR, PROTIME in the last 168 hours. Thyroid  Function Tests: No results for input(s): TSH, T4TOTAL, FREET4, T3FREE, THYROIDAB in the last 72 hours. Lipid Profile: No results for input(s): CHOL, HDL, LDLCALC, TRIG, CHOLHDL, LDLDIRECT in the last 72 hours. Anemia Panel: No results for input(s):  VITAMINB12, FOLATE, FERRITIN, TIBC, IRON, RETICCTPCT in the last 72 hours. Urine analysis:    Component Value Date/Time   COLORURINE AMBER (A) 11/12/2024 1533   APPEARANCEUR CLOUDY (A) 11/12/2024 1533   LABSPEC 1.019 11/12/2024 1533   PHURINE 5.0 11/12/2024 1533   GLUCOSEU NEGATIVE 11/12/2024 1533   HGBUR SMALL (A) 11/12/2024 1533   BILIRUBINUR SMALL (A) 11/12/2024 1533   KETONESUR 5 (A) 11/12/2024 1533   PROTEINUR >=300 (A) 11/12/2024 1533   UROBILINOGEN 1.0 07/29/2014 1837   NITRITE NEGATIVE 11/12/2024 1533   LEUKOCYTESUR TRACE (A) 11/12/2024 1533   Sepsis Labs: Invalid input(s): PROCALCITONIN, LACTICIDVEN  Microbiology: No results found for this or any previous visit (from the past 240 hours).  Radiology Studies: No results found.    Thane Age T. Kaylynne Andres [facility]  If 7PM-7AM, please contact night-coverage [email or web address] 11/13/2024, 1:31 PM   "

## 2024-11-13 NOTE — Plan of Care (Signed)
" °  Problem: Fluid Volume: Goal: Compliance with measures to maintain balanced fluid volume will improve Outcome: Progressing   Problem: Health Behavior/Discharge Planning: Goal: Ability to manage health-related needs will improve Outcome: Progressing   Problem: Nutritional: Goal: Ability to make healthy dietary choices will improve Outcome: Progressing   Problem: Education: Goal: Knowledge of General Education information will improve Description: Including pain rating scale, medication(s)/side effects and non-pharmacologic comfort measures Outcome: Progressing   Problem: Health Behavior/Discharge Planning: Goal: Ability to manage health-related needs will improve Outcome: Progressing   Problem: Clinical Measurements: Goal: Ability to maintain clinical measurements within normal limits will improve Outcome: Progressing Goal: Will remain free from infection Outcome: Progressing Goal: Diagnostic test results will improve Outcome: Progressing Goal: Respiratory complications will improve Outcome: Progressing Goal: Cardiovascular complication will be avoided Outcome: Progressing   Problem: Activity: Goal: Risk for activity intolerance will decrease Outcome: Progressing   Problem: Nutrition: Goal: Adequate nutrition will be maintained Outcome: Progressing   Problem: Coping: Goal: Level of anxiety will decrease Outcome: Progressing   Problem: Elimination: Goal: Will not experience complications related to bowel motility Outcome: Progressing Goal: Will not experience complications related to urinary retention Outcome: Progressing   Problem: Pain Managment: Goal: General experience of comfort will improve and/or be controlled Outcome: Progressing   Problem: Safety: Goal: Ability to remain free from injury will improve Outcome: Progressing   Problem: Skin Integrity: Goal: Risk for impaired skin integrity will decrease Outcome: Progressing   "

## 2024-11-13 NOTE — Progress Notes (Signed)
 Richard Lopez lactic acid was 2.0 it was reported critical. NP Andrez was notified of this lab value.

## 2024-11-13 NOTE — Consult Note (Signed)
 Referring Provider: TH Primary Care Physician:  Celestia Rosaline SQUIBB, NP Primary Gastroenterologist:  Dr. Saintclair  Reason for Consultation: Hematemesis  HPI: Richard Lopez is a 49 y.o. male with past medical history of alcohol abuse, history of esophagitis, history of marijuana use, history of multiple admission in the past for coffee-ground emesis and hematemesis presented to the hospital again with hematemesis.  According to patient, he was binge drinking alcohol for 3 days and subsequently started having nausea vomiting and then started seeing fresh blood in the vomiting.  His symptoms have improved now.  No further vomiting this morning.  He denies any abdominal pain, diarrhea or constipation.  Denies seeing any black stool or blood in the stool.  EGD in June 2025 showed severe LA grade D esophagitis.  Repeat EGD as outpatient in August 2025 by Dr. Saintclair showed normal esophagus, 5 cm hiatal hernia mild gastritis.  Biopsies were negative for H. pylori.  Colonoscopy in August 2025 showed small tubular adenoma, diverticulosis and internal hemorrhoids.  Repeat was recommended in 5 years.    Past Medical History:  Diagnosis Date   Alcohol dependence with intoxication (HCC) 03/25/2019   Anxiety    Arthritis    hands and possibly knee   Atrial fibrillation with RVR (HCC) 04/22/2020   Cannabinoid hyperemesis syndrome 07/29/2014   Cavitary lesion of lung 08/01/2019   Closed right hip fracture, initial encounter (HCC) 03/25/2019   Depression    Diabetes mellitus    diet controlled   Esophagitis determined by endoscopy 08/21/2019   Essential hypertension    Gastritis    Gout    Intertrochanteric fracture of right hip (HCC) 04/14/2019   Normocytic anemia due to blood loss 08/21/2019   Pulmonary embolism [facility])     Past Surgical History:  Procedure Laterality Date   BIOPSY  11/12/2022   Procedure: BIOPSY;  Surgeon: Saintclair Jasper, MD;  Location: [facility];  Service: Gastroenterology;;    ESOPHAGOGASTRODUODENOSCOPY N/A 04/12/2024   Procedure: EGD (ESOPHAGOGASTRODUODENOSCOPY);  Surgeon: Saintclair Jasper, MD;  Location: [facility];  Service: Gastroenterology;  Laterality: N/A;   ESOPHAGOGASTRODUODENOSCOPY (EGD) WITH PROPOFOL  N/A 08/14/2019   Procedure: ESOPHAGOGASTRODUODENOSCOPY (EGD) WITH PROPOFOL ;  Surgeon: Dianna Specking, MD;  Location: [facility];  Service: Endoscopy;  Laterality: N/A;   ESOPHAGOGASTRODUODENOSCOPY (EGD) WITH PROPOFOL  N/A 11/12/2022   Procedure: ESOPHAGOGASTRODUODENOSCOPY (EGD) WITH PROPOFOL ;  Surgeon: Saintclair Jasper, MD;  Location: [facility];  Service: Gastroenterology;  Laterality: N/A;   EXTERNAL FIXATION LEG Left 11/07/2018   Procedure: EXTERNAL FIXATION LEFT LOWER LEG;  Surgeon: Fidel Rogue, MD;  Location: [facility];  Service: Orthopedics;  Laterality: Left;   EXTERNAL FIXATION REMOVAL Left 11/08/2018   Procedure: REMOVAL EXTERNAL FIXATION LEG;  Surgeon: Kendal Franky SQUIBB, MD;  Location: [facility];  Service: Orthopedics;  Laterality: Left;   INTRAMEDULLARY (IM) NAIL INTERTROCHANTERIC Right 03/26/2019   Procedure: INTRAMEDULLARY (IM) NAIL INTERTROCHANTRIC;  Surgeon: Kendal Franky SQUIBB, MD;  Location: [facility];  Service: Orthopedics;  Laterality: Right;   INTRAMEDULLARY (IM) NAIL INTERTROCHANTERIC Right 05/17/2019   Procedure: Intramedullary (Im) Nail Intertroch with circlage wiring;  Surgeon: Celena Sharper, MD;  Location: [facility];  Service: Orthopedics;  Laterality: Right;   NO PAST SURGERIES     OPEN REDUCTION INTERNAL FIXATION (ORIF) TIBIA/FIBULA FRACTURE Left 11/08/2018   Procedure: OPEN REDUCTION INTERNAL FIXATION (ORIF) TIBIA/FIBULA FRACTURE;  Surgeon: Kendal Franky SQUIBB, MD;  Location: [facility];  Service: Orthopedics;  Laterality: Left;   ORIF FEMUR FRACTURE Right 05/17/2019   Procedure: REMOVAL  OF HARDWARE;  Surgeon: Celena Sharper, MD;  Location: [facility] OR;  Service: Orthopedics;  Laterality: Right;    Prior to Admission medications  Medication Sig Start Date End Date Taking?  Authorizing Provider  amLODipine  (NORVASC ) 10 MG tablet Take 10 mg by mouth daily.   Yes [provider]  ondansetron  (ZOFRAN ) 4 MG tablet Take 1 tablet (4 mg total) by mouth every 6 (six) hours as needed for nausea. 08/26/24  Yes Lue Elsie BROCKS, MD  pantoprazole  (PROTONIX ) 40 MG tablet Take 1 tablet (40 mg total) by mouth 2 (two) times daily before a meal. 08/26/24  Yes Lue Elsie BROCKS, MD  sucralfate  (CARAFATE ) 1 GM/10ML suspension Take 1 g by mouth 4 (four) times daily.   Yes [provider]  Blood Glucose-BP Monitor (BLOOD GLUCOSE-WRIST BP MONITOR) DEVI 1 Bag by Does not apply route in the morning, at noon, and at bedtime. 04/24/24   Celestia Rosaline SQUIBB, NP  Blood Pressure Monitoring (BLOOD PRESSURE MONITOR AUTOMAT) DEVI 1 kit by Does not apply route in the morning, at noon, and at bedtime. 05/16/24   Celestia Rosaline SQUIBB, NP  ferrous sulfate  325 (65 FE) MG tablet Take 1 tablet (325 mg total) by mouth daily with breakfast. Patient not taking: Reported on 11/12/2024 08/23/24   Franchot Novel, MD  Multiple Vitamin (MULTIVITAMIN WITH MINERALS) TABS tablet Take 1 tablet by mouth daily. Patient not taking: Reported on 11/12/2024 03/02/24   Celestia Rosaline SQUIBB, NP  polyethylene glycol (MIRALAX ) 17 g packet Take 17 g by mouth daily. Patient not taking: Reported on 11/12/2024 08/22/24   Franchot Novel, MD  potassium chloride  SA (KLOR-CON  M) 20 MEQ tablet Take 1 tablet (20 mEq total) by mouth daily. Patient not taking: Reported on 11/12/2024 08/22/24   Franchot Novel, MD  thiamine  (VITAMIN B-1) 100 MG tablet Take 1 tablet (100 mg total) by mouth daily. Patient not taking: Reported on 11/12/2024 08/23/24   Franchot Novel, MD    Scheduled Meds:  amLODipine   10 mg Oral Daily   folic acid   1 mg Oral Daily   multivitamin with minerals  1 tablet Oral Daily   pantoprazole  (PROTONIX ) IV  40 mg Intravenous Q12H   potassium chloride   40 mEq Oral Once   sucralfate   1 g Oral  TID AC   thiamine   100 mg Oral Daily   Or   thiamine   100 mg Intravenous Daily   Continuous Infusions:  0.9 % NaCl with KCl 20 mEq / L     PRN Meds:.acetaminophen  **OR** acetaminophen , ondansetron  **OR** ondansetron  (ZOFRAN ) IV, mouth rinse  Allergies as of 11/12/2024 - Review Complete 11/12/2024  Allergen Reaction Noted   Ativan  [lorazepam ] Anxiety and Other (See Comments) 08/20/2023   Nsaids Other (See Comments) 08/18/2023    Family History  Problem Relation Age of Onset   Diabetes Mellitus II Father    Diabetes Mellitus II Other    CAD Other     Social History   Socioeconomic History   Marital status: Single    Spouse name: Not on file   Number of children: Not on file   Years of education: Not on file   Highest education level: Some college, no degree  Occupational History   Not on file  Tobacco Use   Smoking status: Former    Types: Cigarettes   Smokeless tobacco: Never   Tobacco comments:    About 1 cigarette per day or less  Vaping Use   Vaping status: Never Used  Substance and Sexual Activity  Alcohol use: Yes    Alcohol/week: 200.0 standard drinks of alcohol    Types: 200 Cans of beer per week   Drug use: Yes    Types: Marijuana   Sexual activity: Yes  Other Topics Concern   Not on file  Social History Narrative   Not on file   Social Drivers of Health   Tobacco Use: Medium Risk (11/13/2024)   Patient History    Smoking Tobacco Use: Former    Smokeless Tobacco Use: Never    Passive Exposure: Not on file  Financial Resource Strain: Medium Risk (06/24/2024)   Overall Financial Resource Strain (CARDIA)    Difficulty of Paying Living Expenses: Somewhat hard  Food Insecurity: No Food Insecurity (11/12/2024)   Epic    Worried About Programme Researcher, Broadcasting/film/video in the Last Year: Never true    Ran Out of Food in the Last Year: Never true  Transportation Needs: No Transportation Needs (11/12/2024)   Epic    Lack of Transportation (Medical): No    Lack of  Transportation (Non-Medical): No  Physical Activity: Not on file  Stress: Stress Concern Present (12/25/2023)   [employer] of Occupational Health - Occupational Stress Questionnaire    Feeling of Stress : Rather much  Social Connections: Moderately Isolated (09/16/2023)   Social Connection and Isolation Panel    Frequency of Communication with Friends and Family: More than three times a week    Frequency of Social Gatherings with Friends and Family: Never    Attends Religious Services: More than 4 times per year    Active Member of Clubs or Organizations: No    Attends Banker Meetings: Never    Marital Status: Never married  Intimate Partner Violence: Not At Risk (11/12/2024)   Epic    Fear of Current or Ex-Partner: No    Emotionally Abused: No    Physically Abused: No    Sexually Abused: No  Depression (PHQ2-9): Medium Risk (08/31/2024)   Depression (PHQ2-9)    PHQ-2 Score: 8  Alcohol Screen: Low Risk (05/31/2024)   Alcohol Screen    Last Alcohol Screening Score (AUDIT): 3  Housing: Low Risk (11/12/2024)   Epic    Unable to Pay for Housing in the Last Year: No    Number of Times Moved in the Last Year: 0    Homeless in the Last Year: No  Utilities: Not At Risk (11/12/2024)   Epic    Threatened with loss of utilities: No  Health Literacy: Inadequate Health Literacy (09/16/2023)   B1300 Health Literacy    Frequency of need for help with medical instructions: Sometimes    Review of Systems: All negative except as stated above in HPI.  Physical Exam: Vital signs: Vitals:   11/13/24 0311 11/13/24 0612  BP: 126/74 129/77  Pulse: 83 85  Resp: 16 20  Temp: 99.2 F (37.3 C) 99 F (37.2 C)  SpO2: 99% 98%   Last BM Date : 11/11/24 General:   Alert,  Well-developed, well-nourished, pleasant and cooperative in NAD Normocephalic, atraumatic Extraocular movement intact Lungs: No visible respiratory distress Heart:  Regular rate and rhythm; no murmurs,  clicks, rubs,  or gallops. Abdomen: Soft, nontender, nondistended, bowel sound present, no peritoneal signs Mood and affect normal Alert and oriented x 3 Rectal:  Deferred  GI:  Lab Results: Recent Labs    11/12/24 0925 11/12/24 1026 11/12/24 2010 11/12/24 2113 11/13/24 0407  WBC 7.7  --   --   --  7.0  HGB 15.4   < > 11.9* 11.7* 10.9*  HCT 44.3   < > 33.6* 32.8* 31.2*  PLT 172  --   --   --  133*   < > = values in this interval not displayed.   BMET Recent Labs    11/12/24 2010 11/12/24 2113 11/13/24 0407  NA 127* 128* 130*  K 3.3* 3.3* 3.2*  CL 88* 90* 91*  CO2 21* 20* 19*  GLUCOSE 121* 107* 98  BUN 26* 26* 23*  CREATININE 2.24* 2.07* 1.69*  CALCIUM  8.5* 8.5* 8.5*   LFT Recent Labs    11/13/24 0407  PROT 7.3  ALBUMIN  4.1  AST 27  ALT 8  ALKPHOS 71  BILITOT 0.8   PT/INR No results for input(s): LABPROT, INR in the last 72 hours.   Studies/Results: No results found.  Impression/Plan: -Hematemesis in a patient with known history of alcohol use and esophagitis.  Last EGD in August 2025 showed normal-appearing esophagus and 5 cm hiatal hernia. - Acute blood loss anemia.  Likely from above.  - Acute kidney injury.  Improving.  Recommendations -------------------------- - Patient's drop in hemoglobin is likely dilutional for his hydration given for acute kidney injury.  Given his history of esophagitis in the past, I think is reasonable to pursue another endoscopy to rule out recurrent esophagitis from his ongoing alcohol use. - Okay to have full liquid diet today.  Keep n.p.o. past midnight.  Plan for EGD tomorrow. - Discussed with hospitalist.  Risks (bleeding, infection, bowel perforation that could require surgery, sedation-related changes in cardiopulmonary systems), benefits (identification and possible treatment of source of symptoms, exclusion of certain causes of symptoms), and alternatives (watchful waiting, radiographic imaging studies,  empiric medical treatment)  were explained to patient/family in detail and patient wishes to proceed.     LOS: 1 day   Layla Lah  MD, FACP 11/13/2024, 10:00 AM  Contact #  [phone]

## 2024-11-13 NOTE — Progress Notes (Signed)
" ° ° °  PROCEDURAL EXPEDITER PROGRESS NOTE  Patient Name: Richard Lopez  DOB:08/31/1976 Date of Admission: 11/12/2024  Date of Assessment:11/13/2024   -------------------------------------------------------------------------------------------------------------------   Brief clinical summary: Pt to ENDO for EGD  Orders in place:  Yes   Labs, test, and orders reviewed: Y  Requires surgical clearance:  No  Barriers noted: N/A  -------------------------------------------------------------------------------------------------------------------  [facility], [place], [place] Please contact us  directly via secure chat (search for [facility]) or by calling us  at [phone] [facility]).  "

## 2024-11-14 ENCOUNTER — Inpatient Hospital Stay (HOSPITAL_COMMUNITY): Admitting: Anesthesiology

## 2024-11-14 ENCOUNTER — Encounter (HOSPITAL_COMMUNITY): Payer: Self-pay

## 2024-11-14 ENCOUNTER — Encounter (HOSPITAL_COMMUNITY): Admission: EM | Disposition: A | Payer: Self-pay | Source: Home / Self Care | Attending: Internal Medicine

## 2024-11-14 DIAGNOSIS — E860 Dehydration: Secondary | ICD-10-CM | POA: Diagnosis not present

## 2024-11-14 DIAGNOSIS — I10 Essential (primary) hypertension: Secondary | ICD-10-CM | POA: Diagnosis not present

## 2024-11-14 DIAGNOSIS — F418 Other specified anxiety disorders: Secondary | ICD-10-CM | POA: Diagnosis not present

## 2024-11-14 DIAGNOSIS — K920 Hematemesis: Secondary | ICD-10-CM

## 2024-11-14 DIAGNOSIS — N179 Acute kidney failure, unspecified: Secondary | ICD-10-CM | POA: Diagnosis not present

## 2024-11-14 DIAGNOSIS — F1010 Alcohol abuse, uncomplicated: Secondary | ICD-10-CM | POA: Diagnosis not present

## 2024-11-14 DIAGNOSIS — K2210 Ulcer of esophagus without bleeding: Secondary | ICD-10-CM | POA: Insufficient documentation

## 2024-11-14 HISTORY — PX: ESOPHAGOGASTRODUODENOSCOPY: SHX5428

## 2024-11-14 LAB — RENAL FUNCTION PANEL
Albumin: 3.9 g/dL (ref 3.5–5.0)
Anion gap: 15 (ref 5–15)
BUN: 14 mg/dL (ref 6–20)
CO2: 20 mmol/L — ABNORMAL LOW (ref 22–32)
Calcium: 8.8 mg/dL — ABNORMAL LOW (ref 8.9–10.3)
Chloride: 95 mmol/L — ABNORMAL LOW (ref 98–111)
Creatinine, Ser: 0.95 mg/dL (ref 0.61–1.24)
GFR, Estimated: >60 mL/min
Glucose, Bld: 124 mg/dL — ABNORMAL HIGH (ref 70–99)
Phosphorus: 1.8 mg/dL — ABNORMAL LOW (ref 2.5–4.6)
Potassium: 3.4 mmol/L — ABNORMAL LOW (ref 3.5–5.1)
Sodium: 130 mmol/L — ABNORMAL LOW (ref 135–145)

## 2024-11-14 LAB — RETICULOCYTES
Immature Retic Fract: 20.4 % — ABNORMAL HIGH (ref 2.3–15.9)
RBC.: 3.89 MIL/uL — ABNORMAL LOW (ref 4.22–5.81)
Retic Count, Absolute: 86.7 K/uL (ref 19.0–186.0)
Retic Ct Pct: 2.2 % (ref 0.4–3.1)

## 2024-11-14 LAB — CBC
HCT: 33.6 % — ABNORMAL LOW (ref 39.0–52.0)
Hemoglobin: 11.7 g/dL — ABNORMAL LOW (ref 13.0–17.0)
MCH: 30.6 pg (ref 26.0–34.0)
MCHC: 34.8 g/dL (ref 30.0–36.0)
MCV: 88 fL (ref 80.0–100.0)
Platelets: 140 K/uL — ABNORMAL LOW (ref 150–400)
RBC: 3.82 MIL/uL — ABNORMAL LOW (ref 4.22–5.81)
RDW: 13 % (ref 11.5–15.5)
WBC: 5.1 K/uL (ref 4.0–10.5)
nRBC: 0 % (ref 0.0–0.2)

## 2024-11-14 LAB — GLUCOSE, CAPILLARY
Glucose-Capillary: 126 mg/dL — ABNORMAL HIGH (ref 70–99)
Glucose-Capillary: 127 mg/dL — ABNORMAL HIGH (ref 70–99)
Glucose-Capillary: 137 mg/dL — ABNORMAL HIGH (ref 70–99)
Glucose-Capillary: 138 mg/dL — ABNORMAL HIGH (ref 70–99)

## 2024-11-14 LAB — VITAMIN B12: Vitamin B-12: 620 pg/mL (ref 180–914)

## 2024-11-14 LAB — IRON AND TIBC
Iron: 32 ug/dL — ABNORMAL LOW (ref 45–182)
Saturation Ratios: 12 % — ABNORMAL LOW (ref 17.9–39.5)
TIBC: 266 ug/dL (ref 250–450)
UIBC: 234 ug/dL

## 2024-11-14 LAB — FOLATE: Folate: >20 ng/mL

## 2024-11-14 LAB — MAGNESIUM: Magnesium: 1.9 mg/dL (ref 1.7–2.4)

## 2024-11-14 LAB — FERRITIN: Ferritin: 567 ng/mL — ABNORMAL HIGH (ref 24–336)

## 2024-11-14 MED ORDER — LOPERAMIDE HCL 2 MG PO CAPS: 2.0000 mg | ORAL_CAPSULE | ORAL | Status: DC | PRN

## 2024-11-14 MED ORDER — POTASSIUM PHOSPHATES 15 MMOLE/5ML IV SOLN
15.0000 mmol | Freq: Once | INTRAVENOUS | Status: AC
  Administered 2024-11-14: 15 mmol via INTRAVENOUS
  Filled 2024-11-14: qty 5

## 2024-11-14 MED ORDER — POTASSIUM CHLORIDE 10 MEQ/100ML IV SOLN
10.0000 meq | INTRAVENOUS | Status: AC
  Administered 2024-11-14 (×4): 10 meq via INTRAVENOUS
  Filled 2024-11-14 (×4): qty 100

## 2024-11-14 MED ORDER — CHLORDIAZEPOXIDE HCL 5 MG PO CAPS: 10.0000 mg | ORAL_CAPSULE | Freq: Four times a day (QID) | ORAL | Status: DC | PRN

## 2024-11-14 MED ORDER — PHENYLEPHRINE 80 MCG/ML (10ML) SYRINGE FOR IV PUSH (FOR BLOOD PRESSURE SUPPORT)
PREFILLED_SYRINGE | INTRAVENOUS | Status: DC | PRN
  Administered 2024-11-14: 240 ug via INTRAVENOUS

## 2024-11-14 MED ORDER — PROPOFOL 10 MG/ML IV BOLUS
INTRAVENOUS | Status: DC | PRN
  Administered 2024-11-14: 50 mg via INTRAVENOUS
  Administered 2024-11-14: 40 mg via INTRAVENOUS
  Administered 2024-11-14: 60 mg via INTRAVENOUS

## 2024-11-14 MED ORDER — PROPOFOL 500 MG/50ML IV EMUL
INTRAVENOUS | Status: AC
  Filled 2024-11-14: qty 50

## 2024-11-14 MED ORDER — HYDROXYZINE HCL 10 MG PO TABS
10.0000 mg | ORAL_TABLET | Freq: Four times a day (QID) | ORAL | Status: DC | PRN
  Administered 2024-11-14 – 2024-11-15 (×3): 10 mg via ORAL
  Filled 2024-11-14 (×3): qty 1

## 2024-11-14 MED ORDER — PROPOFOL 500 MG/50ML IV EMUL
INTRAVENOUS | Status: DC | PRN
  Administered 2024-11-14: 140 ug/kg/min via INTRAVENOUS

## 2024-11-14 MED ORDER — SODIUM CHLORIDE 0.9 % IV SOLN: INTRAVENOUS | Status: DC | PRN

## 2024-11-14 MED ORDER — DEXMEDETOMIDINE HCL IN NACL 80 MCG/20ML IV SOLN
INTRAVENOUS | Status: DC | PRN
  Administered 2024-11-14 (×2): 8 ug via INTRAVENOUS

## 2024-11-14 MED ORDER — PROPOFOL 1000 MG/100ML IV EMUL
INTRAVENOUS | Status: AC
  Filled 2024-11-14: qty 100

## 2024-11-14 MED ORDER — HYDRALAZINE HCL 25 MG PO TABS: 25.0000 mg | ORAL_TABLET | Freq: Four times a day (QID) | ORAL | Status: DC | PRN

## 2024-11-14 NOTE — Progress Notes (Signed)
 " PROGRESS NOTE  Richard Lopez FMW:[id number] DOB: [birth year]   PCP: Celestia Rosaline SQUIBB, NP  Patient is from: Home.  DOA: 11/12/2024 LOS: 2  Chief complaints Chief Complaint  Patient presents with   Nausea     Brief Narrative / Interim history: 49 year old M with PMH of PAF not on anticoagulation, diet-controlled DM-2, HTN, anxiety, depression, gastritis, marijuana use, cannabinoid hyperemesis and tobacco use disorder presenting with intractable nausea, vomiting and abdominal pain with an episode of hematemesis after alcohol binge, and admitted with AKI, hematemesis and intractable nausea and vomiting.  In ED, stable vitals.  Hgb 15.4.  Lactic acid 4.2 but improved to 1.9 quickly.  EtOH, CK and lipase within normal.  Sodium 130.  Creatinine 2.93.  BUN 23.  Patient was started on PPI, IV fluid and admitted.  GI consulted.  AKI and GI symptoms resolved. EGD showed distal esophageal ulcer (biopsied), hiatal hernia and gastritis.  GI recommended regular diet, PPI twice daily for 2 months and sucralfate  1 g 4 times a day for 1 month.  Patient was started on Librium  for alcohol withdrawal.  Subjective: Seen and examined earlier this morning for he went for EGD.  Has no specific complaints.  Looks restless and fidgety.  Reports feeling the way when he goes to bed.  He does not think he is having alcohol withdrawal.  Denies nausea, vomiting or abdominal pain.  No diarrhea.  Assessment and plan: AKI (acute kidney injury) (HCC): Likely due to dehydration in the setting of intractable nausea and vomiting.  Resolved. - Monitor off IV fluid   High anion gap metabolic acidosis: Likely due to lactic acidosis and AKI.  Low suspicion for infection.  Resolving. - Continue monitoring  Intractable nausea, vomiting and abdominal pain: Multifactorial including gastritis, alcohol and marijuana.  Abdominal exam benign.  EGD showed esophageal ulcer, hiatal hernia and gastritis.  -GI recommended regular diet,  PPI twice daily for 2 months and sucralfate  1 g 4 times a day for 1 month. -Continue antiemetics as needed. -Encouraged marijuana and alcohol cessation.   Hematemesis/upper GI bleed: Denies NSAID use.  Not on anticoagulation.  Baseline Hgb between 11 and 12.  Hgb improved after initial drop likely from hemodilution.  EGD as above. Recent Labs    08/22/24 0433 08/24/24 2047 08/24/24 2052 08/26/24 0610 11/12/24 0925 11/12/24 1026 11/12/24 2010 11/12/24 2113 11/13/24 0407 11/14/24 0352  HGB 12.1* 14.7 16.7 11.6* 15.4 16.3 11.9* 11.7* 10.9* 11.7*  - Protonix  and Carafate  as above. - Monitor CBC.   GERD/esophageal ulcer/gastritis-EGD as above. - Protonix  and Carafate  as above - Follow biopsy from esophageal ulcer  Alcohol abuse: Reports drinking about 4 beers a week.  He states his last drink was last week.  He is somewhat restless and fidgety in bed.  Blood pressure elevated. -Start CIWA with as needed Librium .  Allergic to Ativan  -Encouraged alcohol cessation -Continue multivitamin, folic acid  and thiamine .  Paroxysmal A-fib: Currently in sinus rhythm.  Not on meds.  CHA2DS2-VASc score 1 for hypertension.  Prediabetes: A1c 6.1% on 10/23.  Not on meds at home. -Monitor glucose with daily labs  Uncontrolled hypertension: Elevated BP this morning.  Concern for alcohol withdrawal -Manage withdrawal as above. -Continue home amlodipine  -P.o. hydralazine  as needed  Hyponatremia: Likely due to dehydration and alcohol.  Improved.  -Continue IV NS with KCl   Hypokalemia/hypomagnesemia -Monitor replenish K and Mg as appropriate  Body mass index is 26.44 kg/m.  DVT prophylaxis:  SCDs Start: 11/12/24 1247  Code Status: Full code Family Communication: None at bedside Level of care: Progressive Status is: Inpatient Remains inpatient appropriate because: Hematemesis/intractable nausea and vomiting and abdominal pain and alcohol withdrawal   Final disposition:  Home.   55 minutes with more than 50% spent in reviewing records, counseling patient/family and coordinating care.  Consultants:  Gastroenterology  Procedures: 1/12-EGD showed distal esophageal ulcer (biopsied), hiatal hernia and gastritis.  Microbiology summarized: None  Objective: Vitals:   11/13/24 2036 11/14/24 0413 11/14/24 0808 11/14/24 1046  BP: (!) 141/87 (!) 170/98 (!) 155/91 (!) 183/97  Pulse: 64 80 83 80  Resp: 18 20 17 13   Temp: 98.2 F (36.8 C) 98.2 F (36.8 C) 98.3 F (36.8 C) 98 F (36.7 C)  TempSrc: Oral Oral Oral Temporal  SpO2: 100% 100% 100% 100%  Weight:      Height:        Examination:  GENERAL: No apparent distress.  Nontoxic. HEENT: MMM.  Vision and hearing grossly intact.  NECK: Supple.  No apparent JVD.  RESP:  No IWOB.  Fair aeration bilaterally. CVS:  RRR. Heart sounds normal.  ABD/GI/GU: BS+. Abd soft, NTND.  MSK/EXT:  Moves extremities. No apparent deformity. No edema.  SKIN: no apparent skin lesion or wound NEURO: AA.  Oriented appropriately. No apparent focal neuro deficit. PSYCH: Restless and fidgety.  Sch Meds:  Scheduled Meds:  [MAR Hold] amLODipine   10 mg Oral Daily   [MAR Hold] folic acid   1 mg Oral Daily   [MAR Hold] multivitamin with minerals  1 tablet Oral Daily   [MAR Hold] pantoprazole  (PROTONIX ) IV  40 mg Intravenous Q12H   [MAR Hold] sucralfate   1 g Oral TID AC   [MAR Hold] thiamine   100 mg Oral Daily   Or   [MAR Hold] thiamine   100 mg Intravenous Daily   Continuous Infusions:  [MAR Hold] potassium chloride  10 mEq (11/14/24 1050)   PRN Meds:.[MAR Hold] acetaminophen  **OR** [MAR Hold] acetaminophen , [MAR Hold] chlordiazePOXIDE , [MAR Hold] hydrOXYzine , [MAR Hold] loperamide , [MAR Hold] ondansetron  **OR** [MAR Hold] ondansetron  (ZOFRAN ) IV, [MAR Hold] mouth rinse  Antimicrobials: Anti-infectives (From admission, onward)    None        I have personally reviewed the following labs and images: CBC: Recent  Labs  Lab 11/12/24 0925 11/12/24 1026 11/12/24 2010 11/12/24 2113 11/13/24 0407 11/14/24 0352  WBC 7.7  --   --   --  7.0 5.1  HGB 15.4 16.3 11.9* 11.7* 10.9* 11.7*  HCT 44.3 48.0 33.6* 32.8* 31.2* 33.6*  MCV 87.2  --   --   --  87.2 88.0  PLT 172  --   --   --  133* 140*   BMP &GFR Recent Labs  Lab 11/12/24 1133 11/12/24 2010 11/12/24 2113 11/13/24 0407 11/14/24 0352  NA 130* 127* 128* 130* 130*  K 3.7 3.3* 3.3* 3.2* 3.4*  CL 85* 88* 90* 91* 95*  CO2 21* 21* 20* 19* 20*  GLUCOSE 183* 121* 107* 98 124*  BUN 23* 26* 26* 23* 14  CREATININE 2.93* 2.24* 2.07* 1.69* 0.95  CALCIUM  9.7 8.5* 8.5* 8.5* 8.8*  MG 1.5*  --   --  1.9 1.9  PHOS  --   --   --   --  1.8*   Estimated Creatinine Clearance: 124.3 mL/min (by C-G formula based on SCr of 0.95 mg/dL). Liver & Pancreas: Recent Labs  Lab 11/12/24 1133 11/13/24 0407 11/14/24 0352  AST 31  27  --   ALT 11 8  --   ALKPHOS 96 71  --   BILITOT 0.8 0.8  --   PROT 8.9* 7.3  --   ALBUMIN  4.7 4.1 3.9   Recent Labs  Lab 11/12/24 1133  LIPASE 37   No results for input(s): AMMONIA in the last 168 hours. Diabetic: No results for input(s): HGBA1C in the last 72 hours. Recent Labs  Lab 11/13/24 1140 11/13/24 1752 11/14/24 0015 11/14/24 0538 11/14/24 1054  GLUCAP 95 103* 138* 137* 127*   Cardiac Enzymes: Recent Labs  Lab 11/12/24 1133  CKTOTAL 188   No results for input(s): PROBNP in the last 8760 hours. Coagulation Profile: No results for input(s): INR, PROTIME in the last 168 hours. Thyroid  Function Tests: No results for input(s): TSH, T4TOTAL, FREET4, T3FREE, THYROIDAB in the last 72 hours. Lipid Profile: No results for input(s): CHOL, HDL, LDLCALC, TRIG, CHOLHDL, LDLDIRECT in the last 72 hours. Anemia Panel: Recent Labs    11/14/24 0352  VITAMINB12 620  FOLATE >20.0  FERRITIN 567*  TIBC 266  IRON 32*  RETICCTPCT 2.2   Urine analysis:    Component Value Date/Time    COLORURINE AMBER (A) 11/12/2024 1533   APPEARANCEUR CLOUDY (A) 11/12/2024 1533   LABSPEC 1.019 11/12/2024 1533   PHURINE 5.0 11/12/2024 1533   GLUCOSEU NEGATIVE 11/12/2024 1533   HGBUR SMALL (A) 11/12/2024 1533   BILIRUBINUR SMALL (A) 11/12/2024 1533   KETONESUR 5 (A) 11/12/2024 1533   PROTEINUR >=300 (A) 11/12/2024 1533   UROBILINOGEN 1.0 07/29/2014 1837   NITRITE NEGATIVE 11/12/2024 1533   LEUKOCYTESUR TRACE (A) 11/12/2024 1533   Sepsis Labs: Invalid input(s): PROCALCITONIN, LACTICIDVEN  Microbiology: No results found for this or any previous visit (from the past 240 hours).  Radiology Studies: No results found.    Richard Lopez T[facility]ori Sammon [facility]  If 7PM-7AM, please contact night-coverage [email or web address] 11/14/2024, 12:19 PM   "

## 2024-11-14 NOTE — OR Nursing (Signed)
 Loose tooth documented by MDA prior to procedure fell out of mouth when bite block was removed by CRNA after procedure. Pt was aware and educated of the risks prior to procedure. Pt was notified of tooth when in recovery and was understanding; but stated he wanted to get into the music studio ASAP and his voice would sound funny. Collene JAYSON Edu Pittsley

## 2024-11-14 NOTE — Interval H&P Note (Signed)
 History and Physical Interval Note: 48/male with hematemesis for EGD with propofol .  11/14/2024 11:36 AM  Richard Lopez  has presented today for EGD with propofol , with the diagnosis of Hematemesis.  The various methods of treatment have been discussed with the patient and family. After consideration of risks, benefits and other options for treatment, the patient has consented to  Procedures: EGD (ESOPHAGOGASTRODUODENOSCOPY) (N/A) as a surgical intervention.  The patient's history has been reviewed, patient examined, no change in status, stable for surgery.  I have reviewed the patient's chart and labs.  Questions were answered to the patient's satisfaction.     Estelita Manas

## 2024-11-14 NOTE — Transfer of Care (Signed)
 Immediate Anesthesia Transfer of Care Note  Patient: Richard Lopez  Procedure(s) Performed: EGD (ESOPHAGOGASTRODUODENOSCOPY)  Patient Location: PACU  Anesthesia Type:MAC  Level of Consciousness: drowsy  Airway & Oxygen Therapy: Patient Spontanous Breathing and Patient connected to face mask oxygen  Post-op Assessment: Report given to RN and Post -op Vital signs reviewed and stable  Post vital signs: Reviewed and stable  Last Vitals:  Vitals Value Taken Time  BP 125/94 11/14/24 12:21  Temp 36.2 C 11/14/24 12:21  Pulse 87 11/14/24 12:23  Resp 20 11/14/24 12:23  SpO2 100 % 11/14/24 12:23  Vitals shown include unfiled device data.  Last Pain:  Vitals:   11/14/24 1221  TempSrc: Temporal  PainSc: 0-No pain         Complications: No notable events documented.

## 2024-11-14 NOTE — Op Note (Signed)
 [facility] Patient Name: Richard Lopez Procedure Date: 11/14/2024 MRN: [id number] Attending MD: Estelita Manas , MD, [id number] Date of Birth: Nov 17, 1975 CSN: [id number] Age: 49 Admit Type: Inpatient Procedure:                Upper GI endoscopy Indications:              Hematemesis, History of esophagitis and ongoing                            alcohol use. Providers:                Estelita Manas, MD, Darleene Bare, RN, Fairy Marina,                            Technician Referring MD:             [facility] Medicines:                Monitored Anesthesia Care Complications:            No immediate complications. Estimated blood loss:                            Minimal. Estimated Blood Loss:     Estimated blood loss was minimal. Procedure:                Pre-Anesthesia Assessment:                           - Prior to the procedure, a History and Physical                            was performed, and patient medications and                            allergies were reviewed. The patient's tolerance of                            previous anesthesia was also reviewed. The risks                            and benefits of the procedure and the sedation                            options and risks were discussed with the patient.                            All questions were answered, and informed consent                            was obtained. Prior Anticoagulants: The patient has                            taken no anticoagulant or antiplatelet agents. ASA                            Grade Assessment: III -  A patient with severe                            systemic disease. After reviewing the risks and                            benefits, the patient was deemed in satisfactory                            condition to undergo the procedure.                           After obtaining informed consent, the endoscope was                            passed under direct vision.  Throughout the                            procedure, the patient's blood pressure, pulse, and                            oxygen saturations were monitored continuously. The                            [id number]-[id number] ([id number] Olympus endoscope was introduced                            through the mouth, and advanced to the second part                            of duodenum. The upper GI endoscopy was                            accomplished without difficulty. The patient                            tolerated the procedure well. Scope In: Scope Out: Findings:      Few linear and superficial esophageal ulcers were found 35 to 40 cm from       the incisors. Biopsies were taken with a cold forceps for histology.       Estimated blood loss was minimal.      A 4 cm hiatal hernia was present.      Patchy moderately erythematous mucosa without bleeding was found in the       cardia, in the gastric fundus, in the gastric body and in the gastric       antrum. Biopsies were taken with a cold forceps for Helicobacter pylori       testing.      The examined duodenum was normal. Impression:               - Esophageal ulcers. Biopsied.                           - 4 cm hiatal hernia.                           -  Erythematous mucosa in the cardia, gastric                            fundus, gastric body and antrum. Biopsied.                           - Normal examined duodenum. Moderate Sedation:      Patient did not receive moderate sedation for this procedure, but       instead received monitored anesthesia care. Recommendation:           - Resume regular diet.                           - Continue present medications.                           - Await pathology results.                           - Avoid alcohol, limit aspirin  and NSAIDs.                           - PPI twice daily for 2 months and sucralfate  1 g 4                            times a day for 1 month. Procedure Code(s):        --- Professional  ---                           [phone], Esophagogastroduodenoscopy, flexible,                            transoral; with biopsy, single or multiple Diagnosis Code(s):        --- Professional ---                           K22.10, Ulcer of esophagus without bleeding                           K44.9, Diaphragmatic hernia without obstruction or                            gangrene                           K31.89, Other diseases of stomach and duodenum                           K92.0, Hematemesis CPT copyright 2022 [employer]. All rights reserved. The codes documented in this report are preliminary and upon coder review may  be revised to meet current compliance requirements. Estelita Manas, MD 11/14/2024 12:10:56 PM This report has been signed electronically. Number of Addenda: 0

## 2024-11-14 NOTE — Plan of Care (Signed)
" °  Problem: Fluid Volume: Goal: Compliance with measures to maintain balanced fluid volume will improve Outcome: Progressing   Problem: Health Behavior/Discharge Planning: Goal: Ability to manage health-related needs will improve Outcome: Progressing   Problem: Nutritional: Goal: Ability to make healthy dietary choices will improve Outcome: Progressing   Problem: Education: Goal: Knowledge of General Education information will improve Description: Including pain rating scale, medication(s)/side effects and non-pharmacologic comfort measures Outcome: Progressing   Problem: Health Behavior/Discharge Planning: Goal: Ability to manage health-related needs will improve Outcome: Progressing   Problem: Clinical Measurements: Goal: Ability to maintain clinical measurements within normal limits will improve Outcome: Progressing Goal: Will remain free from infection Outcome: Progressing Goal: Diagnostic test results will improve Outcome: Progressing Goal: Respiratory complications will improve Outcome: Progressing Goal: Cardiovascular complication will be avoided Outcome: Progressing   Problem: Activity: Goal: Risk for activity intolerance will decrease Outcome: Progressing   Problem: Nutrition: Goal: Adequate nutrition will be maintained Outcome: Progressing   Problem: Coping: Goal: Level of anxiety will decrease Outcome: Progressing   Problem: Elimination: Goal: Will not experience complications related to bowel motility Outcome: Progressing Goal: Will not experience complications related to urinary retention Outcome: Progressing   Problem: Pain Managment: Goal: General experience of comfort will improve and/or be controlled Outcome: Progressing   Problem: Safety: Goal: Ability to remain free from injury will improve Outcome: Progressing   Problem: Skin Integrity: Goal: Risk for impaired skin integrity will decrease Outcome: Progressing   "

## 2024-11-14 NOTE — Anesthesia Postprocedure Evaluation (Signed)
"   Anesthesia Post Note  Patient: Richard Lopez  Procedure(s) Performed: EGD (ESOPHAGOGASTRODUODENOSCOPY)     Patient location during evaluation: PACU Anesthesia Type: MAC Level of consciousness: awake and alert Pain management: pain level controlled Vital Signs Assessment: post-procedure vital signs reviewed and stable Respiratory status: spontaneous breathing, nonlabored ventilation, respiratory function stable and patient connected to nasal cannula oxygen Cardiovascular status: stable and blood pressure returned to baseline Postop Assessment: no apparent nausea or vomiting Anesthetic complications: no   No notable events documented.  Last Vitals:  Vitals:   11/14/24 1240 11/14/24 1307  BP: (!) 149/91 127/80  Pulse: 68 (!) 55  Resp: 18 16  Temp:  36.7 C  SpO2: 99% 98%    Last Pain:  Vitals:   11/14/24 1307  TempSrc: Oral  PainSc:                  Ananiah Maciolek D Moria Brophy      "

## 2024-11-14 NOTE — Anesthesia Preprocedure Evaluation (Signed)
"                                    Anesthesia Evaluation  Patient identified by MRN, date of birth, ID band Patient awake    Reviewed: Allergy & Precautions, NPO status , Patient's Chart, lab work & pertinent test results  Airway Mallampati: I  TM Distance: >3 FB Neck ROM: Full    Dental  (+) Missing, Poor Dentition, Loose,    Pulmonary Patient abstained from smoking., former smoker, PE   breath sounds clear to auscultation       Cardiovascular hypertension, Pt. on medications + DVT   Rhythm:Regular Rate:Normal     Neuro/Psych  PSYCHIATRIC DISORDERS Anxiety Depression       GI/Hepatic Neg liver ROS,GERD  Medicated,,  Endo/Other  diabetes    Renal/GU Renal disease     Musculoskeletal  (+) Arthritis ,    Abdominal   Peds  Hematology  (+) Blood dyscrasia, anemia   Anesthesia Other Findings   Reproductive/Obstetrics                              Anesthesia Physical Anesthesia Plan  ASA: 3  Anesthesia Plan: MAC   Post-op Pain Management: Minimal or no pain anticipated   Induction: Intravenous  PONV Risk Score and Plan: 0 and Propofol  infusion  Airway Management Planned: Natural Airway and Nasal Cannula  Additional Equipment: None  Intra-op Plan:   Post-operative Plan:   Informed Consent: I have reviewed the patients History and Physical, chart, labs and discussed the procedure including the risks, benefits and alternatives for the proposed anesthesia with the patient or authorized representative who has indicated his/her understanding and acceptance.       Plan Discussed with: CRNA  Anesthesia Plan Comments:         Anesthesia Quick Evaluation  "

## 2024-11-15 ENCOUNTER — Encounter (HOSPITAL_COMMUNITY): Payer: Self-pay | Admitting: Gastroenterology

## 2024-11-15 ENCOUNTER — Inpatient Hospital Stay (HOSPITAL_COMMUNITY)

## 2024-11-15 DIAGNOSIS — F1010 Alcohol abuse, uncomplicated: Secondary | ICD-10-CM | POA: Diagnosis not present

## 2024-11-15 DIAGNOSIS — K219 Gastro-esophageal reflux disease without esophagitis: Secondary | ICD-10-CM | POA: Diagnosis not present

## 2024-11-15 DIAGNOSIS — E871 Hypo-osmolality and hyponatremia: Secondary | ICD-10-CM | POA: Diagnosis not present

## 2024-11-15 DIAGNOSIS — E8729 Other acidosis: Secondary | ICD-10-CM | POA: Diagnosis not present

## 2024-11-15 DIAGNOSIS — N179 Acute kidney failure, unspecified: Secondary | ICD-10-CM | POA: Diagnosis not present

## 2024-11-15 DIAGNOSIS — I480 Paroxysmal atrial fibrillation: Secondary | ICD-10-CM | POA: Diagnosis not present

## 2024-11-15 DIAGNOSIS — E860 Dehydration: Secondary | ICD-10-CM | POA: Diagnosis not present

## 2024-11-15 DIAGNOSIS — E1165 Type 2 diabetes mellitus with hyperglycemia: Secondary | ICD-10-CM | POA: Diagnosis not present

## 2024-11-15 DIAGNOSIS — I10 Essential (primary) hypertension: Secondary | ICD-10-CM | POA: Diagnosis not present

## 2024-11-15 DIAGNOSIS — Z794 Long term (current) use of insulin: Secondary | ICD-10-CM | POA: Diagnosis not present

## 2024-11-15 LAB — GLUCOSE, CAPILLARY
Glucose-Capillary: 120 mg/dL — ABNORMAL HIGH (ref 70–99)
Glucose-Capillary: 137 mg/dL — ABNORMAL HIGH (ref 70–99)
Glucose-Capillary: 154 mg/dL — ABNORMAL HIGH (ref 70–99)
Glucose-Capillary: 154 mg/dL — ABNORMAL HIGH (ref 70–99)

## 2024-11-15 LAB — COMPREHENSIVE METABOLIC PANEL WITH GFR
ALT: 11 U/L (ref 0–44)
AST: 33 U/L (ref 15–41)
Albumin: 3.8 g/dL (ref 3.5–5.0)
Alkaline Phosphatase: 70 U/L (ref 38–126)
Anion gap: 13 (ref 5–15)
BUN: 9 mg/dL (ref 6–20)
CO2: 24 mmol/L (ref 22–32)
Calcium: 9.1 mg/dL (ref 8.9–10.3)
Chloride: 91 mmol/L — ABNORMAL LOW (ref 98–111)
Creatinine, Ser: 0.94 mg/dL (ref 0.61–1.24)
GFR, Estimated: >60 mL/min
Glucose, Bld: 142 mg/dL — ABNORMAL HIGH (ref 70–99)
Potassium: 3.3 mmol/L — ABNORMAL LOW (ref 3.5–5.1)
Sodium: 127 mmol/L — ABNORMAL LOW (ref 135–145)
Total Bilirubin: 0.6 mg/dL (ref 0.0–1.2)
Total Protein: 7.2 g/dL (ref 6.5–8.1)

## 2024-11-15 LAB — SURGICAL PATHOLOGY

## 2024-11-15 LAB — CBC
HCT: 34.3 % — ABNORMAL LOW (ref 39.0–52.0)
Hemoglobin: 11.8 g/dL — ABNORMAL LOW (ref 13.0–17.0)
MCH: 30.5 pg (ref 26.0–34.0)
MCHC: 34.4 g/dL (ref 30.0–36.0)
MCV: 88.6 fL (ref 80.0–100.0)
Platelets: 172 K/uL (ref 150–400)
RBC: 3.87 MIL/uL — ABNORMAL LOW (ref 4.22–5.81)
RDW: 13 % (ref 11.5–15.5)
WBC: 4.4 K/uL (ref 4.0–10.5)
nRBC: 0 % (ref 0.0–0.2)

## 2024-11-15 LAB — RENAL FUNCTION PANEL
Albumin: 3.7 g/dL (ref 3.5–5.0)
Anion gap: 15 (ref 5–15)
BUN: 8 mg/dL (ref 6–20)
CO2: 20 mmol/L — ABNORMAL LOW (ref 22–32)
Calcium: 9.1 mg/dL (ref 8.9–10.3)
Chloride: 94 mmol/L — ABNORMAL LOW (ref 98–111)
Creatinine, Ser: 0.98 mg/dL (ref 0.61–1.24)
GFR, Estimated: >60 mL/min
Glucose, Bld: 202 mg/dL — ABNORMAL HIGH (ref 70–99)
Phosphorus: 2.7 mg/dL (ref 2.5–4.6)
Potassium: 4.4 mmol/L (ref 3.5–5.1)
Sodium: 129 mmol/L — ABNORMAL LOW (ref 135–145)

## 2024-11-15 LAB — MAGNESIUM: Magnesium: 1.8 mg/dL (ref 1.7–2.4)

## 2024-11-15 LAB — URIC ACID
Uric Acid, Serum: 7.3 mg/dL (ref 3.7–8.6)
Uric Acid, Serum: 7.1 mg/dL (ref 3.7–8.6)

## 2024-11-15 MED ORDER — POTASSIUM CHLORIDE CRYS ER 20 MEQ PO TBCR
40.0000 meq | EXTENDED_RELEASE_TABLET | ORAL | Status: AC
  Administered 2024-11-15 (×2): 40 meq via ORAL
  Filled 2024-11-15 (×2): qty 2

## 2024-11-15 NOTE — TOC CM/SW Note (Signed)
 Transition of Care [facility]) CM/SW Note    Transition of Care [facility]) - Inpatient Brief Assessment   Patient Details  Name: Richard Lopez MRN: [id number] Date of Birth: 01/22/1976  Transition of Care [facility]) CM/SW Contact:    Tawni CHRISTELLA Eva, LCSW Phone Number: 11/15/2024, 11:34 AM   Clinical Narrative:  CSW received consult for substance use resources, resources has been attached to pt's AVS.   Transition of Care Asessment: Insurance and Status: Insurance coverage has been reviewed Patient has primary care physician: Yes Home environment has been reviewed: home with self Prior level of function:: independent Prior/Current Home Services: No current home services Social Drivers of Health Review: SDOH reviewed no interventions necessary Readmission risk has been reviewed: Yes Transition of care needs: no transition of care needs at this time

## 2024-11-15 NOTE — Progress Notes (Signed)
 " PROGRESS NOTE  Richard Lopez FMW:[id number] DOB: 10/10/1976   PCP: Celestia Rosaline SQUIBB, NP  Patient is from: Home.  DOA: 11/12/2024 LOS: 3  Chief complaints Chief Complaint  Patient presents with   Nausea     Brief Narrative / Interim history: 49 year old M with PMH of PAF not on anticoagulation, diet-controlled DM-2, HTN, anxiety, depression, gastritis, marijuana use, cannabinoid hyperemesis and tobacco use disorder presenting with intractable nausea, vomiting and abdominal pain with an episode of hematemesis after alcohol binge, and admitted with AKI, hematemesis and intractable nausea and vomiting.  In ED, stable vitals.  Hgb 15.4.  Lactic acid 4.2 but improved to 1.9 quickly.  EtOH, CK and lipase within normal.  Sodium 130.  Creatinine 2.93.  BUN 23.  Patient was started on PPI, IV fluid and admitted.  GI consulted.  AKI and GI symptoms resolved. EGD showed distal esophageal ulcer (biopsied), hiatal hernia and gastritis.  GI recommended regular diet, PPI twice daily for 2 months and sucralfate  1 g 4 times a day for 1 month.  Hemoglobin remained stable.  Patient was started on Librium  for alcohol withdrawal.   Subjective: Seen and examined earlier this morning.  Reports right knee swelling and pain.  He has history of gout.  He says his gout flares up every time he receives IV fluid.  He says he had previous arthrocentesis.  He says his gout usually improved with Indocin  which he cannot take now due to esophageal ulcer.  He says he did not tolerate colchicine  in the past.   Assessment and plan: AKI (acute kidney injury) (HCC): Likely due to dehydration in the setting of intractable nausea and vomiting.  Resolved.   High anion gap metabolic acidosis: Likely due to lactic acidosis and AKI.  Low suspicion for infection.  Resolving. - Continue monitoring  Intractable nausea, vomiting and abdominal pain: Multifactorial including esophageal ulcer, gastritis, alcohol and marijuana.   Abdominal exam benign.  EGD showed esophageal ulcer, hiatal hernia and gastritis.  -GI recommended regular diet, PPI twice daily for 2 months and sucralfate  1 g 4 times a day for 1 month. -Continue antiemetics as needed. -Encouraged marijuana and alcohol cessation.   Hematemesis/upper GI bleed: Denies NSAID use.  Not on anticoagulation.  Baseline Hgb between 11 and 12.  Hgb improved after initial drop likely from hemodilution.  EGD as above. Recent Labs    08/24/24 2047 08/24/24 2052 08/26/24 0610 11/12/24 0925 11/12/24 1026 11/12/24 2010 11/12/24 2113 11/13/24 0407 11/14/24 0352 11/15/24 0404  HGB 14.7 16.7 11.6* 15.4 16.3 11.9* 11.7* 10.9* 11.7* 11.8*  - Continue Protonix  and Carafate  as above.   GERD/esophageal ulcer/gastritis-EGD as above. - Protonix  and Carafate  as above - Follow biopsy from esophageal ulcer  Alcohol abuse: Reports drinking about 4 beers a week.  He states his last drink was last weekend.  Withdrawal symptoms improved after starting low-dose Librium . -Continue CIWA with as needed Librium .  Allergic to Ativan  -Encouraged alcohol cessation -Continue multivitamin, folic acid  and thiamine .  Paroxysmal A-fib: Currently in sinus rhythm.  Not on meds.  CHA2DS2-VASc score 1 for hypertension.  Prediabetes: A1c 6.1% on 10/23.  Not on meds at home. -Monitor glucose with daily labs  Uncontrolled hypertension: BP improved after starting Librium . -Manage withdrawal as above. -Continue home amlodipine  -P.o. hydralazine  as needed  Hyponatremia: Sodium dropped some today. -Recheck renal panel this afternoon -Further workup if sodium drops further.   Hypokalemia/hypomagnesemia: K3.3 - P.o. KCl 40 mEq x 2 - Recheck renal function in  the afternoon.  Right knee swelling and pain: Patient with known history of gout.  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  Had arthrocentesis in 2020 that showed crystals.  He has remarkable effusion on exam.  Some focal tenderness.   Significantly limited range of motion due to this.  Limited treatment option due to intolerance of colchicine , esophageal ulcer and gastritis. - Will obtain right knee x-ray and check uric acid - Would consult orthopedic surgery for possible arthrocentesis and possible steroid injection. - PT/OT eval  Body mass index is 26.44 kg/m.           DVT prophylaxis:  SCDs Start: 11/12/24 1247  Code Status: Full code Family Communication: None at bedside Level of care: Progressive Status is: Inpatient Remains inpatient appropriate because: Right knee swelling, pain and effusion, hyponatremia and hypokalemia   Final disposition: Home.   55 minutes with more than 50% spent in reviewing records, counseling patient/family and coordinating care.  Consultants:  Gastroenterology  Procedures: 1/12-EGD showed distal esophageal ulcer (biopsied), hiatal hernia and gastritis.  Microbiology summarized: None  Objective: Vitals:   11/14/24 1307 11/14/24 2137 11/15/24 0557 11/15/24 1318  BP: 127/80 118/86 112/76 122/77  Pulse: (!) 55 76 80 75  Resp: 16 18  15   Temp: 98 F (36.7 C) 98.6 F (37 C) 98.7 F (37.1 C) 97.8 F (36.6 C)  TempSrc: Oral Oral Oral   SpO2: 98% 100% 100% 96%  Weight:      Height:        Examination:  GENERAL: No apparent distress.  Nontoxic. HEENT: MMM.  Vision and hearing grossly intact.  NECK: Supple.  No apparent JVD.  RESP:  No IWOB.  Fair aeration bilaterally. CVS:  RRR. Heart sounds normal.  ABD/GI/GU: BS+. Abd soft, NTND.  MSK/EXT:  Moves extremities.  Significant right knee effusion.  Tender to palpation but no erythema.  Very limited range of motion due to pain.  Tophi in both arms.  Mild effusion in left knee. SKIN: no apparent skin lesion or wound NEURO: AA.  Oriented appropriately. No apparent focal neuro deficit. PSYCH: Restless and fidgety.  Sch Meds:  Scheduled Meds:  amLODipine   10 mg Oral Daily   folic acid   1 mg Oral Daily    multivitamin with minerals  1 tablet Oral Daily   pantoprazole  (PROTONIX ) IV  40 mg Intravenous Q12H   sucralfate   1 g Oral TID AC   thiamine   100 mg Oral Daily   Or   thiamine   100 mg Intravenous Daily   Continuous Infusions:   PRN Meds:.acetaminophen  **OR** acetaminophen , chlordiazePOXIDE , hydrALAZINE , hydrOXYzine , loperamide , ondansetron  **OR** ondansetron  (ZOFRAN ) IV, mouth rinse  Antimicrobials: Anti-infectives (From admission, onward)    None        I have personally reviewed the following labs and images: CBC: Recent Labs  Lab 11/12/24 0925 11/12/24 1026 11/12/24 2010 11/12/24 2113 11/13/24 0407 11/14/24 0352 11/15/24 0404  WBC 7.7  --   --   --  7.0 5.1 4.4  HGB 15.4   < > 11.9* 11.7* 10.9* 11.7* 11.8*  HCT 44.3   < > 33.6* 32.8* 31.2* 33.6* 34.3*  MCV 87.2  --   --   --  87.2 88.0 88.6  PLT 172  --   --   --  133* 140* 172   < > = values in this interval not displayed.   BMP &GFR Recent Labs  Lab 11/12/24 1133 11/12/24 2010 11/12/24 2113 11/13/24 0407 11/14/24 0352 11/15/24  0404  NA 130* 127* 128* 130* 130* 127*  K 3.7 3.3* 3.3* 3.2* 3.4* 3.3*  CL 85* 88* 90* 91* 95* 91*  CO2 21* 21* 20* 19* 20* 24  GLUCOSE 183* 121* 107* 98 124* 142*  BUN 23* 26* 26* 23* 14 9  CREATININE 2.93* 2.24* 2.07* 1.69* 0.95 0.94  CALCIUM  9.7 8.5* 8.5* 8.5* 8.8* 9.1  MG 1.5*  --   --  1.9 1.9 1.8  PHOS  --   --   --   --  1.8*  --    Estimated Creatinine Clearance: 125.6 mL/min (by C-G formula based on SCr of 0.94 mg/dL). Liver & Pancreas: Recent Labs  Lab 11/12/24 1133 11/13/24 0407 11/14/24 0352 11/15/24 0404  AST 31 27  --  33  ALT 11 8  --  11  ALKPHOS 96 71  --  70  BILITOT 0.8 0.8  --  0.6  PROT 8.9* 7.3  --  7.2  ALBUMIN  4.7 4.1 3.9 3.8   Recent Labs  Lab 11/12/24 1133  LIPASE 37   No results for input(s): AMMONIA in the last 168 hours. Diabetic: No results for input(s): HGBA1C in the last 72 hours. Recent Labs  Lab 11/14/24 0538  11/14/24 1054 11/15/24 0002 11/15/24 0557 11/15/24 1119  GLUCAP 137* 127* 120* 154* 154*   Cardiac Enzymes: Recent Labs  Lab 11/12/24 1133  CKTOTAL 188   No results for input(s): PROBNP in the last 8760 hours. Coagulation Profile: No results for input(s): INR, PROTIME in the last 168 hours. Thyroid  Function Tests: No results for input(s): TSH, T4TOTAL, FREET4, T3FREE, THYROIDAB in the last 72 hours. Lipid Profile: No results for input(s): CHOL, HDL, LDLCALC, TRIG, CHOLHDL, LDLDIRECT in the last 72 hours. Anemia Panel: Recent Labs    11/14/24 0352  VITAMINB12 620  FOLATE >20.0  FERRITIN 567*  TIBC 266  IRON 32*  RETICCTPCT 2.2   Urine analysis:    Component Value Date/Time   COLORURINE AMBER (A) 11/12/2024 1533   APPEARANCEUR CLOUDY (A) 11/12/2024 1533   LABSPEC 1.019 11/12/2024 1533   PHURINE 5.0 11/12/2024 1533   GLUCOSEU NEGATIVE 11/12/2024 1533   HGBUR SMALL (A) 11/12/2024 1533   BILIRUBINUR SMALL (A) 11/12/2024 1533   KETONESUR 5 (A) 11/12/2024 1533   PROTEINUR >=300 (A) 11/12/2024 1533   UROBILINOGEN 1.0 07/29/2014 1837   NITRITE NEGATIVE 11/12/2024 1533   LEUKOCYTESUR TRACE (A) 11/12/2024 1533   Sepsis Labs: Invalid input(s): PROCALCITONIN, LACTICIDVEN  Microbiology: No results found for this or any previous visit (from the past 240 hours).  Radiology Studies: No results found.    Tanishka Drolet T. Jakalyn Kratky [facility]  If 7PM-7AM, please contact night-coverage [email or web address] 11/15/2024, 3:58 PM   "

## 2024-11-15 NOTE — Plan of Care (Signed)
  Problem: Fluid Volume: Goal: Compliance with measures to maintain balanced fluid volume will improve Outcome: Progressing   Problem: Health Behavior/Discharge Planning: Goal: Ability to manage health-related needs will improve Outcome: Progressing

## 2024-11-15 NOTE — Plan of Care (Signed)
" °  Problem: Fluid Volume: Goal: Compliance with measures to maintain balanced fluid volume will improve Outcome: Progressing   Problem: Health Behavior/Discharge Planning: Goal: Ability to manage health-related needs will improve Outcome: Progressing   Problem: Nutritional: Goal: Ability to make healthy dietary choices will improve Outcome: Progressing   Problem: Education: Goal: Knowledge of General Education information will improve Description: Including pain rating scale, medication(s)/side effects and non-pharmacologic comfort measures Outcome: Progressing   Problem: Health Behavior/Discharge Planning: Goal: Ability to manage health-related needs will improve Outcome: Progressing   Problem: Clinical Measurements: Goal: Ability to maintain clinical measurements within normal limits will improve Outcome: Progressing Goal: Will remain free from infection Outcome: Progressing Goal: Diagnostic test results will improve Outcome: Progressing Goal: Respiratory complications will improve Outcome: Progressing Goal: Cardiovascular complication will be avoided Outcome: Progressing   Problem: Activity: Goal: Risk for activity intolerance will decrease Outcome: Progressing   Problem: Nutrition: Goal: Adequate nutrition will be maintained Outcome: Progressing   Problem: Coping: Goal: Level of anxiety will decrease Outcome: Progressing   Problem: Elimination: Goal: Will not experience complications related to bowel motility Outcome: Progressing Goal: Will not experience complications related to urinary retention Outcome: Progressing   Problem: Pain Managment: Goal: General experience of comfort will improve and/or be controlled Outcome: Progressing   Problem: Safety: Goal: Ability to remain free from injury will improve Outcome: Progressing   Problem: Skin Integrity: Goal: Risk for impaired skin integrity will decrease Outcome: Progressing   "

## 2024-11-16 ENCOUNTER — Other Ambulatory Visit (HOSPITAL_COMMUNITY): Payer: Self-pay

## 2024-11-16 DIAGNOSIS — I480 Paroxysmal atrial fibrillation: Secondary | ICD-10-CM | POA: Diagnosis not present

## 2024-11-16 DIAGNOSIS — M25461 Effusion, right knee: Secondary | ICD-10-CM | POA: Diagnosis not present

## 2024-11-16 DIAGNOSIS — N179 Acute kidney failure, unspecified: Secondary | ICD-10-CM | POA: Diagnosis not present

## 2024-11-16 DIAGNOSIS — E8729 Other acidosis: Secondary | ICD-10-CM | POA: Diagnosis not present

## 2024-11-16 DIAGNOSIS — E8809 Other disorders of plasma-protein metabolism, not elsewhere classified: Secondary | ICD-10-CM

## 2024-11-16 DIAGNOSIS — K2900 Acute gastritis without bleeding: Secondary | ICD-10-CM | POA: Diagnosis not present

## 2024-11-16 DIAGNOSIS — E860 Dehydration: Secondary | ICD-10-CM | POA: Diagnosis not present

## 2024-11-16 DIAGNOSIS — K219 Gastro-esophageal reflux disease without esophagitis: Secondary | ICD-10-CM | POA: Diagnosis not present

## 2024-11-16 DIAGNOSIS — K2210 Ulcer of esophagus without bleeding: Secondary | ICD-10-CM | POA: Diagnosis not present

## 2024-11-16 DIAGNOSIS — E871 Hypo-osmolality and hyponatremia: Secondary | ICD-10-CM | POA: Diagnosis not present

## 2024-11-16 DIAGNOSIS — I10 Essential (primary) hypertension: Secondary | ICD-10-CM | POA: Diagnosis not present

## 2024-11-16 DIAGNOSIS — F1010 Alcohol abuse, uncomplicated: Secondary | ICD-10-CM | POA: Diagnosis not present

## 2024-11-16 DIAGNOSIS — E1165 Type 2 diabetes mellitus with hyperglycemia: Secondary | ICD-10-CM | POA: Diagnosis not present

## 2024-11-16 LAB — COMPREHENSIVE METABOLIC PANEL WITH GFR
ALT: 13 U/L (ref 0–44)
AST: 34 U/L (ref 15–41)
Albumin: 4 g/dL (ref 3.5–5.0)
Alkaline Phosphatase: 75 U/L (ref 38–126)
Anion gap: 14 (ref 5–15)
BUN: 7 mg/dL (ref 6–20)
CO2: 24 mmol/L (ref 22–32)
Calcium: 9.6 mg/dL (ref 8.9–10.3)
Chloride: 93 mmol/L — ABNORMAL LOW (ref 98–111)
Creatinine, Ser: 0.93 mg/dL (ref 0.61–1.24)
GFR, Estimated: >60 mL/min
Glucose, Bld: 161 mg/dL — ABNORMAL HIGH (ref 70–99)
Potassium: 3.6 mmol/L (ref 3.5–5.1)
Sodium: 131 mmol/L — ABNORMAL LOW (ref 135–145)
Total Bilirubin: 0.5 mg/dL (ref 0.0–1.2)
Total Protein: 7.8 g/dL (ref 6.5–8.1)

## 2024-11-16 LAB — CBC
HCT: 36.6 % — ABNORMAL LOW (ref 39.0–52.0)
Hemoglobin: 12.5 g/dL — ABNORMAL LOW (ref 13.0–17.0)
MCH: 30.6 pg (ref 26.0–34.0)
MCHC: 34.2 g/dL (ref 30.0–36.0)
MCV: 89.7 fL (ref 80.0–100.0)
Platelets: 219 K/uL (ref 150–400)
RBC: 4.08 MIL/uL — ABNORMAL LOW (ref 4.22–5.81)
RDW: 13 % (ref 11.5–15.5)
WBC: 6 K/uL (ref 4.0–10.5)
nRBC: 0 % (ref 0.0–0.2)

## 2024-11-16 LAB — GLUCOSE, CAPILLARY
Glucose-Capillary: 128 mg/dL — ABNORMAL HIGH (ref 70–99)
Glucose-Capillary: 134 mg/dL — ABNORMAL HIGH (ref 70–99)
Glucose-Capillary: 195 mg/dL — ABNORMAL HIGH (ref 70–99)

## 2024-11-16 LAB — MAGNESIUM: Magnesium: 1.5 mg/dL — ABNORMAL LOW (ref 1.7–2.4)

## 2024-11-16 MED ORDER — POTASSIUM CHLORIDE CRYS ER 20 MEQ PO TBCR
40.0000 meq | EXTENDED_RELEASE_TABLET | Freq: Once | ORAL | Status: AC
  Administered 2024-11-16: 40 meq via ORAL
  Filled 2024-11-16: qty 2

## 2024-11-16 MED ORDER — VITAMIN B-1 100 MG PO TABS
100.0000 mg | ORAL_TABLET | Freq: Every day | ORAL | 3 refills | Status: AC
  Filled 2024-11-16: qty 30, 30d supply, fill #0

## 2024-11-16 MED ORDER — FOLIC ACID 1 MG PO TABS
1.0000 mg | ORAL_TABLET | Freq: Every day | ORAL | 0 refills | Status: AC
  Filled 2024-11-16: qty 30, 30d supply, fill #0

## 2024-11-16 MED ORDER — TRIAMCINOLONE ACETONIDE 40 MG/ML IJ SUSP
80.0000 mg | Freq: Once | INTRAMUSCULAR | Status: AC
  Administered 2024-11-16: 80 mg via INTRA_ARTICULAR
  Filled 2024-11-16: qty 2

## 2024-11-16 MED ORDER — SUCRALFATE 1 GM/10ML PO SUSP
1.0000 g | Freq: Four times a day (QID) | ORAL | 0 refills | Status: AC
  Filled 2024-11-16: qty 473, 12d supply, fill #0

## 2024-11-16 MED ORDER — COLCHICINE 0.6 MG PO TABS: 0.6000 mg | ORAL_TABLET | Freq: Every day | ORAL | Status: DC

## 2024-11-16 MED ORDER — PANTOPRAZOLE SODIUM 40 MG PO TBEC
DELAYED_RELEASE_TABLET | ORAL | 0 refills | Status: AC
  Filled 2024-11-16: qty 150, 90d supply, fill #0

## 2024-11-16 MED ORDER — MAGNESIUM SULFATE 2 GM/50ML IV SOLN
2.0000 g | Freq: Once | INTRAVENOUS | Status: AC
  Administered 2024-11-16: 2 g via INTRAVENOUS
  Filled 2024-11-16: qty 50

## 2024-11-16 MED ORDER — BUPIVACAINE HCL (PF) 0.5 % IJ SOLN
20.0000 mL | Freq: Once | INTRAMUSCULAR | Status: AC
  Administered 2024-11-16: 20 mL via INTRA_ARTICULAR
  Filled 2024-11-16: qty 20

## 2024-11-16 MED ORDER — COLCHICINE 0.6 MG PO TABS
1.2000 mg | ORAL_TABLET | Freq: Once | ORAL | Status: DC
  Filled 2024-11-16: qty 2

## 2024-11-16 NOTE — Discharge Summary (Signed)
 "  Physician Discharge Summary  Richard Lopez FMW:[id number] DOB: June 13, 1976 DOA: 11/12/2024  PCP: Celestia Rosaline SQUIBB, NP  Admit date: 11/12/2024 Discharge date: 11/16/2024  Admitted From: Home Disposition: Home Recommendations for Outpatient Follow-up:  Outpatient follow-up with PCP in 1 to 2 weeks Check CMP and CBC at follow-up Please follow up on the following pending results: None.  Home Health: HHPT Equipment/Devices: No need identified  Discharge Condition: Stable CODE STATUS: Full code   Follow-up Information     Celestia Rosaline SQUIBB, NP. Schedule an appointment as soon as possible for a visit in 1 week(s).   Specialty: Internal Medicine Contact information: [address] [place] [address] [phone]                 Hospital course 49 year old M with PMH of PAF not on anticoagulation, diet-controlled DM-2, HTN, anxiety, depression, gastritis, marijuana use, cannabinoid hyperemesis and tobacco use disorder presenting with intractable nausea, vomiting and abdominal pain with an episode of hematemesis after alcohol binge, and admitted with AKI, hematemesis and intractable nausea and vomiting.   In ED, stable vitals.  Hgb 15.4.  Lactic acid 4.2 but improved to 1.9 quickly.  EtOH, CK and lipase within normal.  Sodium 130.  Creatinine 2.93.  BUN 23.  Patient was started on PPI, IV fluid and admitted.  GI consulted.   AKI and GI symptoms resolved. EGD showed distal esophageal ulcer (biopsied), hiatal hernia and gastritis (biopsied).  GI recommended regular diet, PPI twice daily for 2 months and sucralfate  1 g 4 times a day for 1 month.  Hemoglobin remained stable.  Esophageal ulcer biopsy showed acute esophagitis with ulcer.  Biopsy from gastritis negative for H. pylori or malignancy.   Patient was started on Librium  for brief alcohol withdrawal symptoms but symptoms improved and he did not require the medication.  He is encouraged to stop drinking alcohol.   Provided with resources..   See individual problem list below for more.   Problems addressed during this hospitalization AKI (acute kidney injury) (HCC): Likely due to dehydration in the setting of intractable nausea and vomiting.  Resolved.   High anion gap metabolic acidosis: Likely due to lactic acidosis and AKI.  Low suspicion for infection.  Resolved Intractable nausea, vomiting and abdominal pain: Multifactorial including esophageal ulcer, gastritis, alcohol and marijuana.  Abdominal exam benign.  EGD showed esophageal ulcer, hiatal hernia and gastritis.  GI symptoms resolved. -GI recommended regular diet, PPI twice daily for 2 months and sucralfate  1 g 4 times a day for 1 month. -Encouraged marijuana and alcohol cessation.   Hematemesis/upper GI bleed: Denies NSAID use.  Not on anticoagulation.  Baseline Hgb between 11 and 12.  Hgb improved after initial drop likely from hemodilution.  EGD as above. - Continue Protonix  and Carafate  as above. - Check CBC in 1 week   GERD/esophageal ulcer/gastritis-EGD as above.  Biopsy showed acute esophagitis.  Negative for H. pylori or neoplasm. - Protonix  and Carafate  as above - Encouraged alcohol cessation   Alcohol abuse: Reports drinking about 4 beers a week.  He states his last drink was last weekend.  Brief restlessness that has resolved without medication. -Encouraged alcohol cessation -Continue multivitamin, folic acid  and thiamine .   Right knee swelling and pain: Patient with known history of gout.  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  Had arthrocentesis in 2020 that showed crystals.  He has remarkable effusion on exam.  Some focal tenderness.  Significantly limited range of motion due to  this.  Limited treatment option due to intolerance of colchicine , esophageal ulcer and gastritis.  X-ray showed severe arthritic changes in right knee and large suprapatellar effusion.  Orthopedic surgery, Dr. Jerri consulted and did arthrocentesis with  removal of 300 cc gouty fluid.  Per Ortho, patient felt much better afterward. - Recommend referral to rheumatology outpatient. - Encouraged to stop drinking alcohol. - HHPT ordered.  Paroxysmal A-fib: Currently in sinus rhythm.  Not on meds.  CHA2DS2-VASc score 1 for hypertension.   Prediabetes: A1c 6.1% on 10/23.  Not on meds at home.   Uncontrolled hypertension: Normotensive - Continue home amlodipine    Hyponatremia: Sodium dropped some today. -Recheck renal panel this afternoon -Further workup if sodium drops further.   Hypokalemia/hypomagnesemia: Hypokalemia resolved.  Magnesium  replenished prior to discharge.     Body mass index is 26.44 kg/m.           Consultations: Gastroenterology Orthopedic surgery  Time spent 35  minutes  Vital signs Vitals:   11/16/24 0339 11/16/24 0500 11/16/24 0845 11/16/24 1431  BP: (!) 143/86  (!) 146/94 (!) 145/92  Pulse: 78  81 80  Temp: 99.6 F (37.6 C) 98.2 F (36.8 C) 98.9 F (37.2 C) 98.7 F (37.1 C)  Resp: 18  18 20   Height:      Weight:      SpO2: 100%  99% 100%  TempSrc: Oral Oral Oral Oral  BMI (Calculated):         Discharge exam  GENERAL: No apparent distress.  Nontoxic. HEENT: MMM.  Vision and hearing grossly intact.  NECK: Supple.  No apparent JVD.  RESP:  No IWOB.  Fair aeration bilaterally. CVS:  RRR. Heart sounds normal.  ABD/GI/GU: BS+. Abd soft, NTND.  MSK/EXT:  Moves extremities.  Significant right knee effusion.  Tender to palpation but no erythema.  Very limited range of motion due to pain.  Tophi in both arms.  Mild effusion in left knee. SKIN: no apparent skin lesion or wound NEURO: AA.  Oriented appropriately. No apparent focal neuro deficit. PSYCH: Calm.  No distress or agitation.  Discharge Instructions Discharge Instructions     Discharge instructions   Complete by: As directed    It has been a pleasure taking care of you!  You were hospitalized due to nausea, vomiting and  abdominal pain.  Your endoscopy showed esophageal ulcer and gastritis.  You have been started on Protonix  and Carafate .  Is very important that you take your medications as prescribed.  It is also very important that you stop drinking alcohol.  Avoid any over-the-counter pain medication other than plain Tylenol .  Follow-up with your primary care doctor in 1 to 2 weeks or sooner if needed.  In regards to gout, we recommend you establish care with rheumatology.  You may ask your primary care doctor for referral.   Take care,   Increase activity slowly   Complete by: As directed       Allergies as of 11/16/2024       Reactions   Ativan  [lorazepam ] Anxiety, Other (See Comments)   Hallucinations   Nsaids Other (See Comments)   Hx of esophageal ulcer        Medication List     STOP taking these medications    potassium chloride  SA 20 MEQ tablet Commonly known as: KLOR-CON  M       TAKE these medications    amLODipine  10 MG tablet Commonly known as: NORVASC  Take 10 mg by mouth daily.  Blood Glucose-Wrist BP Monitor Devi 1 Bag by Does not apply route in the morning, at noon, and at bedtime.   Blood Pressure Monitor Automat Devi 1 kit by Does not apply route in the morning, at noon, and at bedtime.   ferrous sulfate  325 (65 FE) MG tablet Take 1 tablet (325 mg total) by mouth daily with breakfast.   folic acid  1 MG tablet Commonly known as: FOLVITE  Take 1 tablet (1 mg total) by mouth daily. Start taking on: November 17, 2024   multivitamin with minerals Tabs tablet Take 1 tablet by mouth daily.   ondansetron  4 MG tablet Commonly known as: ZOFRAN  Take 1 tablet (4 mg total) by mouth every 6 (six) hours as needed for nausea.   pantoprazole  40 MG tablet Commonly known as: Protonix  Take 1 tablet (40 mg total) by mouth 2 (two) times daily before a meal for 60 days, THEN 1 tablet (40 mg total) daily. Start taking on: November 16, 2024 What changed: See the new  instructions.   polyethylene glycol 17 g packet Commonly known as: MiraLax  Take 17 g by mouth daily.   sucralfate  1 GM/10ML suspension Commonly known as: CARAFATE  Take 10 mLs (1 g total) by mouth 4 (four) times daily.   thiamine  100 MG tablet Commonly known as: Vitamin B-1 Take 1 tablet (100 mg total) by mouth daily.         Procedures/Studies: 1/12-EGD showed distal esophageal ulcer (biopsied), hiatal hernia and gastritis.  1/14-aspiration of right knee effusion   DG Knee Right Port Result Date: 11/15/2024 CLINICAL DATA:  Right knee swelling. EXAM: PORTABLE RIGHT KNEE - 1-2 VIEW COMPARISON:  Right knee radiograph dated 08/25/2023. FINDINGS: No acute fracture or dislocation. The bones are osteopenic. 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 Partially visualized femoral nail. There is a large suprapatellar effusion. The soft tissues are gross unremarkable IMPRESSION: 1. No acute fracture or dislocation. 2. 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 3. Large suprapatellar effusion. Electronically Signed   By: Vanetta Chou M.D.   On: 11/15/2024 15:57       The results of significant diagnostics from this hospitalization (including imaging, microbiology, ancillary and laboratory) are listed below for reference.     Microbiology: No results found for this or any previous visit (from the past 240 hours).   Labs:  CBC: Recent Labs  Lab 11/12/24 0925 11/12/24 1026 11/12/24 2113 11/13/24 0407 11/14/24 0352 11/15/24 0404 11/16/24 0409  WBC 7.7  --   --  7.0 5.1 4.4 6.0  HGB 15.4   < > 11.7* 10.9* 11.7* 11.8* 12.5*  HCT 44.3   < > 32.8* 31.2* 33.6* 34.3* 36.6*  MCV 87.2  --   --  87.2 88.0 88.6 89.7  PLT 172  --   --  133* 140* 172 219   < > = values in this interval not displayed.   BMP  &GFR Recent Labs  Lab 11/12/24 1133 11/12/24 2010 11/13/24 0407 11/14/24 0352 11/15/24 0404 11/15/24 1930 11/16/24 0409  NA 130*   < > 130* 130* 127* 129* 131*  K 3.7   < > 3.2* 3.4* 3.3* 4.4 3.6  CL 85*   < > 91* 95* 91* 94* 93*  CO2 21*   < > 19* 20* 24 20* 24  GLUCOSE 183*   < > 98 124* 142* 202* 161*  BUN 23*   < > 23* 14 9 8 7   CREATININE 2.93*   < > 1.69* 0.95 0.94 0.98 0.93  CALCIUM  9.7   < > 8.5* 8.8* 9.1 9.1 9.6  MG 1.5*  --  1.9 1.9 1.8  --  1.5*  PHOS  --   --   --  1.8*  --  2.7  --    < > = values in this interval not displayed.   Estimated Creatinine Clearance: 126.9 mL/min (by C-G formula based on SCr of 0.93 mg/dL). Liver & Pancreas: Recent Labs  Lab 11/12/24 1133 11/13/24 0407 11/14/24 0352 11/15/24 0404 11/15/24 1930 11/16/24 0409  AST 31 27  --  33  --  34  ALT 11 8  --  11  --  13  ALKPHOS 96 71  --  70  --  75  BILITOT 0.8 0.8  --  0.6  --  0.5  PROT 8.9* 7.3  --  7.2  --  7.8  ALBUMIN  4.7 4.1 3.9 3.8 3.7 4.0   Recent Labs  Lab 11/12/24 1133  LIPASE 37   No results for input(s): AMMONIA in the last 168 hours. Diabetic: No results for input(s): HGBA1C in the last 72 hours. Recent Labs  Lab 11/15/24 1119 11/15/24 1736 11/16/24 0007 11/16/24 0615 11/16/24 1115  GLUCAP 154* 137* 134* 128* 195*   Cardiac Enzymes: Recent Labs  Lab 11/12/24 1133  CKTOTAL 188   No results for input(s): PROBNP in the last 8760 hours. Coagulation Profile: No results for input(s): INR, PROTIME in the last 168 hours. Thyroid  Function Tests: No results for input(s): TSH, T4TOTAL, FREET4, T3FREE, THYROIDAB in the last 72 hours. Lipid Profile: No results for input(s): CHOL, HDL, LDLCALC, TRIG, CHOLHDL, LDLDIRECT in the last 72 hours. Anemia Panel: Recent Labs    11/14/24 0352  VITAMINB12 620  FOLATE >20.0  FERRITIN 567*  TIBC 266  IRON 32*  RETICCTPCT 2.2   Urine analysis:    Component Value Date/Time   COLORURINE  AMBER (A) 11/12/2024 1533   APPEARANCEUR CLOUDY (A) 11/12/2024 1533   LABSPEC 1.019 11/12/2024 1533   PHURINE 5.0 11/12/2024 1533   GLUCOSEU NEGATIVE 11/12/2024 1533   HGBUR SMALL (A) 11/12/2024 1533   BILIRUBINUR SMALL (A) 11/12/2024 1533   KETONESUR 5 (A) 11/12/2024 1533   PROTEINUR >=300 (A) 11/12/2024 1533   UROBILINOGEN 1.0 07/29/2014 1837   NITRITE NEGATIVE 11/12/2024 1533   LEUKOCYTESUR TRACE (A) 11/12/2024 1533   Sepsis Labs: Invalid input(s): PROCALCITONIN, LACTICIDVEN   SIGNED:  Prateek Knipple T Sharnette Kitamura, MD  [facility] 11/16/2024, 2:55 PM   "

## 2024-11-16 NOTE — TOC Transition Note (Signed)
 Transition of Care [facility]) - Discharge Note   Patient Details  Name: RIP HAWES MRN: [id number] Date of Birth: 1976/04/13  Transition of [facility] CM/SW Contact:  Tawni CHRISTELLA Eva, LCSW Phone Number: 11/16/2024, 3:30 PM   Clinical Narrative:     Pt rec for home health PT/OT services. Barriers to home health services is pt's insurance. Pt has several declines at this time. Pt will need to follow up with OP PT, referral sent. ICM sign off.   Final next level of care: Home/Self Care Barriers to Discharge: Barriers Resolved   Patient Goals and CMS Choice            Discharge Placement                       Discharge Plan and Services Additional resources added to the After Visit Summary for                                       Social Drivers of Health (SDOH) Interventions SDOH Screenings   Food Insecurity: No Food Insecurity (11/12/2024)  Housing: Low Risk (11/12/2024)  Transportation Needs: No Transportation Needs (11/12/2024)  Utilities: Not At Risk (11/12/2024)  Alcohol Screen: Low Risk (05/31/2024)  Depression (PHQ2-9): Medium Risk (08/31/2024)  Financial Resource Strain: Medium Risk (06/24/2024)  Social Connections: Moderately Isolated (09/16/2023)  Stress: Stress Concern Present (12/25/2023)  Tobacco Use: Medium Risk (11/14/2024)  Health Literacy: Inadequate Health Literacy (09/16/2023)     Readmission Risk Interventions    08/22/2024   11:44 AM 04/11/2024    3:34 PM 03/17/2024   10:42 AM  Readmission Risk Prevention Plan  Transportation Screening Complete Complete Complete  PCP or Specialist Appt within 3-5 Days   Complete  HRI or Home Care Consult   Complete  Social Work Consult for Recovery Care Planning/Counseling   Complete  Palliative Care Screening   Not Applicable  Medication Review Oceanographer) Complete Complete Complete  PCP or Specialist appointment within 3-5 days of discharge Complete Complete   HRI or Home Care  Consult Complete Complete   SW Recovery Care/Counseling Consult Complete Complete   Palliative Care Screening  Not Applicable   Skilled Nursing Facility Not Applicable Not Applicable

## 2024-11-16 NOTE — Progress Notes (Signed)
 Discharge medications delivered to patient at the bedside in a secure bag.

## 2024-11-16 NOTE — Progress Notes (Signed)
 Discharge instructions reviewed with patient, verbalized understanding. All questions answered. All belongings accounted for. Patient to follow up with MD in  1-2 weeks. PIV removed.   Patient calling for ride home.

## 2024-11-16 NOTE — Evaluation (Addendum)
 Physical Therapy Evaluation Patient Details Name: Richard Lopez MRN: [id number] DOB: 1976-03-10 Today's Date: 11/16/2024  History of Present Illness  49 year old M presenting with intractable nausea, vomiting and abdominal pain with an episode of hematemesis after alcohol binge, and admitted with AKI, hematemesis and intractable nausea and vomiting. Pt also with R knee gout flare. PMH of PAF not on anticoagulation, diet-controlled DM-2, HTN, anxiety, depression, gastritis, marijuana use, cannabinoid hyperemesis and tobacco use disorder.  Clinical Impression  Pt admitted with above diagnosis. Pt reports he is independent with mobility at baseline, at times he uses a cane for longer ambulation distances or when his R knee is bothering him. Today pt reports 10/10 R knee pain 2* gout flare, RN notified of pt request for tylenol  (per chart pt doesn't tolerate colchicine ). Pt reports he's had gout flares in the past when he is dehydrated. Pt transferred bed to chair with his cane but could not tolerate weight bearing to RLE so ambulation was deferred. I expect he will progress well with mobility when R knee pain decreases.  Pt currently with functional limitations due to the deficits listed below (see PT Problem List). Pt will benefit from acute skilled PT to increase their independence and safety with mobility to allow discharge.           If plan is discharge home, recommend the following: A little help with walking and/or transfers;A little help with bathing/dressing/bathroom;Assistance with cooking/housework;Assist for transportation;Help with stairs or ramp for entrance   Can travel by private vehicle        Equipment Recommendations None recommended by PT  Recommendations for Other Services       Functional Status Assessment Patient has had a recent decline in their functional status and demonstrates the ability to make significant improvements in function in a reasonable and predictable amount  of time.     Precautions / Restrictions Precautions Precautions: Fall Recall of Precautions/Restrictions: Intact Restrictions Weight Bearing Restrictions Per Provider Order: No      Mobility  Bed Mobility Overal bed mobility: Modified Independent             General bed mobility comments: used rail    Transfers Overall transfer level: Needs assistance Equipment used: Straight cane Transfers: Bed to chair/wheelchair/BSC, Sit to/from Stand Sit to Stand: Supervision   Step pivot transfers: Supervision, From elevated surface       General transfer comment: from elevated bed, increased time 2* pain, step pivot to recliner with minimal WBing on RLE 2* pain    Ambulation/Gait               General Gait Details: deferred 2* pain  Stairs            Wheelchair Mobility     Tilt Bed    Modified [place] (Stroke Patients Only)       Balance Overall balance assessment: Needs assistance Sitting-balance support: Feet supported, No upper extremity supported Sitting balance-Leahy Scale: Good     Standing balance support: During functional activity, Single extremity supported, Reliant on assistive device for balance Standing balance-Leahy Scale: Poor                               Pertinent Vitals/Pain Pain Assessment Pain Assessment: 0-10 Pain Score: 10-Worst pain ever Pain Location: R knee Pain Descriptors / Indicators: Sore Pain Intervention(s): Limited activity within patient's tolerance, Monitored during session, Patient requesting pain meds-RN notified, Repositioned  Home Living Family/patient expects to be discharged to:: Private residence Living Arrangements: Alone Available Help at Discharge: Family;Available PRN/intermittently   Home Access: Stairs to enter Entrance Stairs-Rails: None Entrance Stairs-Number of Steps: 2   Home Layout: One level Home Equipment: Cane - single Librarian, Academic (2 wheels);Crutches;Shower  seat      Prior Function Prior Level of Function : Independent/Modified Independent             Mobility Comments: usually walks without AD, uses cane when he has a gout flare (like now) or has to walk long distances; 1 fall in October (passed out due to low BP and dehydration) ADLs Comments: independent; sits to shower     Extremity/Trunk Assessment   Upper Extremity Assessment Upper Extremity Assessment: Overall WFL for tasks assessed    Lower Extremity Assessment Lower Extremity Assessment: RLE deficits/detail RLE Deficits / Details: effusion noted R superior knee, pt reports he gets gout flares in R knee when he gets dehydrated, SLR 3/5, knee AAROM ~10-35* limited by pain RLE: Unable to fully assess due to pain RLE Sensation: WNL RLE Coordination: WNL    Cervical / Trunk Assessment Cervical / Trunk Assessment: Normal  Communication   Communication Communication: No apparent difficulties    Cognition Arousal: Alert Behavior During Therapy: WFL for tasks assessed/performed   PT - Cognitive impairments: No apparent impairments                         Following commands: Intact       Cueing       General Comments      Exercises     Assessment/Plan    PT Assessment Patient needs continued PT services  PT Problem List Decreased range of motion;Decreased strength;Decreased mobility;Decreased balance;Decreased activity tolerance;Pain       PT Treatment Interventions Gait training;Therapeutic exercise;DME instruction;Therapeutic activities;Functional mobility training;Balance training;Patient/family education    PT Goals (Current goals can be found in the Care Plan section)  Acute Rehab PT Goals Patient Stated Goal: clean up his house after some renovations PT Goal Formulation: With patient Time For Goal Achievement: 11/30/24 Potential to Achieve Goals: Good    Frequency Min 3X/week     Co-evaluation               AM-PAC PT 6  Clicks Mobility  Outcome Measure Help needed turning from your back to your side while in a flat bed without using bedrails?: None Help needed moving from lying on your back to sitting on the side of a flat bed without using bedrails?: None Help needed moving to and from a bed to a chair (including a wheelchair)?: None Help needed standing up from a chair using your arms (e.g., wheelchair or bedside chair)?: None Help needed to walk in hospital room?: A Little Help needed climbing 3-5 steps with a railing? : A Little 6 Click Score: 22    End of Session Equipment Utilized During Treatment: Gait belt Activity Tolerance: Patient tolerated treatment well Patient left: in chair;with chair alarm set;with call bell/phone within reach Nurse Communication: Mobility status PT Visit Diagnosis: Difficulty in walking, not elsewhere classified (R26.2);Pain;Other abnormalities of gait and mobility (R26.89);Unsteadiness on feet (R26.81) Pain - Right/Left: Right Pain - part of body: Knee    Time: 9054-9040 PT Time Calculation (min) (ACUTE ONLY): 14 min   Charges:   PT Evaluation $PT Eval Moderate Complexity: 1 Mod  Sylvan Nest Kistler PT 11/16/2024  Acute Rehabilitation Services  Office [phone]

## 2024-11-16 NOTE — Consult Note (Signed)
 "  ORTHOPAEDIC CONSULTATION  REQUESTING PHYSICIAN: Mignon Bump, MD  Chief Complaint: Right knee swelling and gout  HPI: Richard Lopez is a 49 y.o. male who presents with right knee swelling of a few days.  Has had numerous gout flare ups in the right knee and has had aspirations and steroid injections.  Due to gastritis, espohageal ulcer and interolance to colchicine , unable to use steroids or NSAIDs.  Ortho consulted for right knee aspiration and steroid injection.  Past Medical History:  Diagnosis Date   Alcohol dependence with intoxication (HCC) 03/25/2019   Anxiety    Arthritis    hands and possibly knee   Atrial fibrillation with RVR (HCC) 04/22/2020   Cannabinoid hyperemesis syndrome 07/29/2014   Cavitary lesion of lung 08/01/2019   Closed right hip fracture, initial encounter (HCC) 03/25/2019   Depression    Diabetes mellitus    diet controlled   Esophagitis determined by endoscopy 08/21/2019   Essential hypertension    Gastritis    Gout    Intertrochanteric fracture of right hip (HCC) 04/14/2019   Normocytic anemia due to blood loss 08/21/2019   Pulmonary embolism [facility])    Past Surgical History:  Procedure Laterality Date   BIOPSY  11/12/2022   Procedure: BIOPSY;  Surgeon: Saintclair Jasper, MD;  Location: [facility];  Service: Gastroenterology;;   ESOPHAGOGASTRODUODENOSCOPY N/A 04/12/2024   Procedure: EGD (ESOPHAGOGASTRODUODENOSCOPY);  Surgeon: Saintclair Jasper, MD;  Location: [facility];  Service: Gastroenterology;  Laterality: N/A;   ESOPHAGOGASTRODUODENOSCOPY N/A 11/14/2024   Procedure: EGD (ESOPHAGOGASTRODUODENOSCOPY);  Surgeon: Saintclair Jasper, MD;  Location: [facility];  Service: Gastroenterology;  Laterality: N/A;   ESOPHAGOGASTRODUODENOSCOPY (EGD) WITH PROPOFOL  N/A 08/14/2019   Procedure: ESOPHAGOGASTRODUODENOSCOPY (EGD) WITH PROPOFOL ;  Surgeon: Dianna Specking, MD;  Location: [facility];  Service: Endoscopy;  Laterality: N/A;   ESOPHAGOGASTRODUODENOSCOPY (EGD) WITH  PROPOFOL  N/A 11/12/2022   Procedure: ESOPHAGOGASTRODUODENOSCOPY (EGD) WITH PROPOFOL ;  Surgeon: Saintclair Jasper, MD;  Location: [facility];  Service: Gastroenterology;  Laterality: N/A;   EXTERNAL FIXATION LEG Left 11/07/2018   Procedure: EXTERNAL FIXATION LEFT LOWER LEG;  Surgeon: Fidel Rogue, MD;  Location: [facility];  Service: Orthopedics;  Laterality: Left;   EXTERNAL FIXATION REMOVAL Left 11/08/2018   Procedure: REMOVAL EXTERNAL FIXATION LEG;  Surgeon: Kendal Franky SQUIBB, MD;  Location: [facility];  Service: Orthopedics;  Laterality: Left;   INTRAMEDULLARY (IM) NAIL INTERTROCHANTERIC Right 03/26/2019   Procedure: INTRAMEDULLARY (IM) NAIL INTERTROCHANTRIC;  Surgeon: Kendal Franky SQUIBB, MD;  Location: [facility];  Service: Orthopedics;  Laterality: Right;   INTRAMEDULLARY (IM) NAIL INTERTROCHANTERIC Right 05/17/2019   Procedure: Intramedullary (Im) Nail Intertroch with circlage wiring;  Surgeon: Celena Sharper, MD;  Location: [facility];  Service: Orthopedics;  Laterality: Right;   NO PAST SURGERIES     OPEN REDUCTION INTERNAL FIXATION (ORIF) TIBIA/FIBULA FRACTURE Left 11/08/2018   Procedure: OPEN REDUCTION INTERNAL FIXATION (ORIF) TIBIA/FIBULA FRACTURE;  Surgeon: Kendal Franky SQUIBB, MD;  Location: [facility];  Service: Orthopedics;  Laterality: Left;   ORIF FEMUR FRACTURE Right 05/17/2019   Procedure: REMOVAL  OF HARDWARE;  Surgeon: Celena Sharper, MD;  Location: [facility];  Service: Orthopedics;  Laterality: Right;   Social History   Socioeconomic History   Marital status: Single    Spouse name: Not on file   Number of children: Not on file   Years of education: Not on file   Highest education level: Some college, no degree  Occupational History   Not on file  Tobacco Use   Smoking status: Former  Types: Cigarettes   Smokeless tobacco: Never   Tobacco comments:    About 1 cigarette per day or less  Vaping Use   Vaping status: Never Used  Substance and Sexual Activity   Alcohol use: Yes    Alcohol/week: 200.0 standard  drinks of alcohol    Types: 200 Cans of beer per week    Comment: Less as of 11/15/2023   Drug use: Yes    Types: Marijuana    Comment: denies use   Sexual activity: Yes  Other Topics Concern   Not on file  Social History Narrative   Not on file   Social Drivers of Health   Tobacco Use: Medium Risk (11/14/2024)   Patient History    Smoking Tobacco Use: Former    Smokeless Tobacco Use: Never    Passive Exposure: Not on file  Financial Resource Strain: Medium Risk (06/24/2024)   Overall Financial Resource Strain (CARDIA)    Difficulty of Paying Living Expenses: Somewhat hard  Food Insecurity: No Food Insecurity (11/12/2024)   Epic    Worried About Programme Researcher, Broadcasting/film/video in the Last Year: Never true    Ran Out of Food in the Last Year: Never true  Transportation Needs: No Transportation Needs (11/12/2024)   Epic    Lack of Transportation (Medical): No    Lack of Transportation (Non-Medical): No  Physical Activity: Not on file  Stress: Stress Concern Present (12/25/2023)   [employer] of Occupational Health - Occupational Stress Questionnaire    Feeling of Stress : Rather much  Social Connections: Moderately Isolated (09/16/2023)   Social Connection and Isolation Panel    Frequency of Communication with Friends and Family: More than three times a week    Frequency of Social Gatherings with Friends and Family: Never    Attends Religious Services: More than 4 times per year    Active Member of Clubs or Organizations: No    Attends Banker Meetings: Never    Marital Status: Never married  Depression (PHQ2-9): Medium Risk (08/31/2024)   Depression (PHQ2-9)    PHQ-2 Score: 8  Alcohol Screen: Low Risk (05/31/2024)   Alcohol Screen    Last Alcohol Screening Score (AUDIT): 3  Housing: Low Risk (11/12/2024)   Epic    Unable to Pay for Housing in the Last Year: No    Number of Times Moved in the Last Year: 0    Homeless in the Last Year: No  Utilities: Not At Risk  (11/12/2024)   Epic    Threatened with loss of utilities: No  Health Literacy: Inadequate Health Literacy (09/16/2023)   B1300 Health Literacy    Frequency of need for help with medical instructions: Sometimes   Family History  Problem Relation Age of Onset   Diabetes Mellitus II Father    Diabetes Mellitus II Other    CAD Other    - negative except otherwise stated in the family history section Allergies[1] Prior to Admission medications  Medication Sig Start Date End Date Taking? Authorizing Provider  amLODipine  (NORVASC ) 10 MG tablet Take 10 mg by mouth daily.   Yes [provider]  ondansetron  (ZOFRAN ) 4 MG tablet Take 1 tablet (4 mg total) by mouth every 6 (six) hours as needed for nausea. 08/26/24  Yes Lue Elsie BROCKS, MD  Blood Glucose-BP Monitor (BLOOD GLUCOSE-WRIST BP MONITOR) DEVI 1 Bag by Does not apply route in the morning, at noon, and at bedtime. 04/24/24   Celestia Rosaline SQUIBB, NP  Blood Pressure Monitoring (BLOOD PRESSURE MONITOR AUTOMAT) DEVI 1 kit by Does not apply route in the morning, at noon, and at bedtime. 05/16/24   Celestia Rosaline SQUIBB, NP  ferrous sulfate  325 (65 FE) MG tablet Take 1 tablet (325 mg total) by mouth daily with breakfast. Patient not taking: Reported on 11/12/2024 08/23/24   Franchot Novel, MD  folic acid  (FOLVITE ) 1 MG tablet Take 1 tablet (1 mg total) by mouth daily. 11/17/24   Gonfa, Taye T, MD  Multiple Vitamin (MULTIVITAMIN WITH MINERALS) TABS tablet Take 1 tablet by mouth daily. Patient not taking: Reported on 11/12/2024 03/02/24   Celestia Rosaline SQUIBB, NP  pantoprazole  (PROTONIX ) 40 MG tablet Take 1 tablet (40 mg total) by mouth 2 (two) times daily before a meal for 60 days, THEN 1 tablet (40 mg total) daily. 11/16/24 02/14/25  Gonfa, Taye T, MD  polyethylene glycol (MIRALAX ) 17 g packet Take 17 g by mouth daily. Patient not taking: Reported on 11/12/2024 08/22/24   Franchot Novel, MD  sucralfate  (CARAFATE ) 1 GM/10ML suspension Take  10 mLs (1 g total) by mouth 4 (four) times daily. 11/16/24   Gonfa, Taye T, MD  thiamine  (VITAMIN B-1) 100 MG tablet Take 1 tablet (100 mg total) by mouth daily. 11/16/24   Kathrin Mignon DASEN, MD   DG Knee Right Port Result Date: 11/15/2024 CLINICAL DATA:  Right knee swelling. EXAM: PORTABLE RIGHT KNEE - 1-2 VIEW COMPARISON:  Right knee radiograph dated 08/25/2023. FINDINGS: No acute fracture or dislocation. The bones are osteopenic. 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 Partially visualized femoral nail. There is a large suprapatellar effusion. The soft tissues are gross unremarkable IMPRESSION: 1. No acute fracture or dislocation. 2. 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 3. Large suprapatellar effusion. Electronically Signed   By: Vanetta Chou M.D.   On: 11/15/2024 15:57   - pertinent xrays, CT, MRI studies were reviewed and independently interpreted  Positive ROS: All other systems have been reviewed and were otherwise negative with the exception of those mentioned in the HPI and as above.  Physical Exam: General: No acute distress Cardiovascular: No pedal edema Respiratory: No cyanosis, no use of accessory musculature GI: No organomegaly, abdomen is soft and non-tender Skin: No lesions in the area of chief complaint Neurologic: Sensation intact distally Psychiatric: Patient is at baseline mood and affect Lymphatic: No axillary or cervical lymphadenopathy  MUSCULOSKELETAL:  Very large joint effusion No signs of infection Slight discomfort with joint motion  Assessment: Right knee gout flare and effusion  Plan: -about 300 cc of bright synovial fluid aspirated from the right knee and injected with kenalog  under sterile conditions.  Tolerated well. ACE bandage applied.  Thank you for the consult and the  opportunity to see Richard Lopez. Ozell Cummins, MD Maralee Morita 7:03 PM        [1]  Allergies Allergen Reactions   Ativan  [Lorazepam ] Anxiety and Other (See Comments)    Hallucinations   Nsaids Other (See Comments)    Hx of esophageal ulcer   "

## 2024-11-17 ENCOUNTER — Telehealth: Payer: Self-pay | Admitting: *Deleted

## 2024-11-17 NOTE — Transitions of Care (Post Inpatient/ED Visit) (Signed)
" ° °  11/17/2024  Name: Richard Lopez MRN: [id number] DOB: Sep 16, 1976  Today's TOC FU Call Status: Today's TOC FU Call Status:: Unsuccessful Call (1st Attempt) Unsuccessful Call (1st Attempt) Date: 11/17/24  Attempted to reach the patient regarding the most recent Inpatient/ED visit.  Follow Up Plan: Additional outreach attempts will be made to reach the patient to complete the Transitions of Care (Post Inpatient/ED visit) call.   Andrea Dimes RN, BSN [place]  [facility] RN Care Manager [phone]  "

## 2024-11-18 ENCOUNTER — Telehealth: Payer: Self-pay

## 2024-11-18 NOTE — Transitions of Care (Post Inpatient/ED Visit) (Signed)
" ° °  11/18/2024  Name: Richard Lopez MRN: [id number] DOB: 05-13-1976  Today's TOC FU Call Status: Today's TOC FU Call Status:: Unsuccessful Call (2nd Attempt) Unsuccessful Call (2nd Attempt) Date: 11/18/24  Attempted to reach the patient regarding the most recent Inpatient/ED visit.  Follow Up Plan: Additional outreach attempts will be made to reach the patient to complete the Transitions of Care (Post Inpatient/ED visit) call.   Shona Prow RN, CCM [place]  [facility]Population [facility] RN Care Manager [phone]  "

## 2024-11-18 NOTE — Transitions of Care (Post Inpatient/ED Visit) (Signed)
" ° °  11/18/2024  Name: Richard Lopez MRN: [id number] DOB: [birth year]  Today's TOC FU Call Status: Today's TOC FU Call Status:: Unsuccessful Call (3rd Attempt) Unsuccessful Call (3rd Attempt) Date: 11/18/24  Attempted to reach the patient regarding the most recent Inpatient/ED visit.  Follow Up Plan: No further outreach attempts will be made at this time. We have been unable to contact the patient.  Shona Prow RN, CCM [place]  [facility]BC[facility]-Population Health RN Care Manager [phone]  "
